# Patient Record
Sex: Male | Born: 1935
Health system: Southern US, Community
[De-identification: ages and names within clinical notes are randomized; demographics above are authoritative.]

## PROBLEM LIST (undated history)

## (undated) DIAGNOSIS — D649 Anemia, unspecified: Secondary | ICD-10-CM

## (undated) DIAGNOSIS — L0390 Cellulitis, unspecified: Secondary | ICD-10-CM

## (undated) DIAGNOSIS — C801 Malignant (primary) neoplasm, unspecified: Secondary | ICD-10-CM

## (undated) DIAGNOSIS — J45909 Unspecified asthma, uncomplicated: Secondary | ICD-10-CM

## (undated) DIAGNOSIS — H5440 Blindness, one eye, unspecified eye: Secondary | ICD-10-CM

## (undated) DIAGNOSIS — K922 Gastrointestinal hemorrhage, unspecified: Secondary | ICD-10-CM

## (undated) DIAGNOSIS — K5900 Constipation, unspecified: Secondary | ICD-10-CM

## (undated) DIAGNOSIS — I4819 Other persistent atrial fibrillation: Secondary | ICD-10-CM

## (undated) DIAGNOSIS — G894 Chronic pain syndrome: Secondary | ICD-10-CM

## (undated) DIAGNOSIS — J939 Pneumothorax, unspecified: Secondary | ICD-10-CM

## (undated) DIAGNOSIS — J189 Pneumonia, unspecified organism: Secondary | ICD-10-CM

## (undated) DIAGNOSIS — Z973 Presence of spectacles and contact lenses: Secondary | ICD-10-CM

## (undated) DIAGNOSIS — I4891 Unspecified atrial fibrillation: Secondary | ICD-10-CM

## (undated) DIAGNOSIS — N189 Chronic kidney disease, unspecified: Secondary | ICD-10-CM

## (undated) DIAGNOSIS — M5136 Other intervertebral disc degeneration, lumbar region: Secondary | ICD-10-CM

## (undated) DIAGNOSIS — Z9289 Personal history of other medical treatment: Secondary | ICD-10-CM

## (undated) DIAGNOSIS — C49A2 Gastrointestinal stromal tumor of stomach: Secondary | ICD-10-CM

## (undated) DIAGNOSIS — R1900 Intra-abdominal and pelvic swelling, mass and lump, unspecified site: Secondary | ICD-10-CM

## (undated) DIAGNOSIS — M51369 Other intervertebral disc degeneration, lumbar region without mention of lumbar back pain or lower extremity pain: Secondary | ICD-10-CM

## (undated) DIAGNOSIS — M419 Scoliosis, unspecified: Secondary | ICD-10-CM

## (undated) DIAGNOSIS — G4700 Insomnia, unspecified: Secondary | ICD-10-CM

## (undated) DIAGNOSIS — G4730 Sleep apnea, unspecified: Secondary | ICD-10-CM

## (undated) DIAGNOSIS — L97519 Non-pressure chronic ulcer of other part of right foot with unspecified severity: Secondary | ICD-10-CM

## (undated) DIAGNOSIS — G2581 Restless legs syndrome: Secondary | ICD-10-CM

## (undated) HISTORY — PX: CHOLECYSTECTOMY: SHX55

## (undated) HISTORY — PX: TONSILLECTOMY: SUR1361

## (undated) HISTORY — DX: Pneumonia, unspecified organism: J18.9

## (undated) HISTORY — DX: Other intervertebral disc degeneration, lumbar region without mention of lumbar back pain or lower extremity pain: M51.369

## (undated) HISTORY — DX: Chronic pain syndrome: G89.4

## (undated) HISTORY — DX: Insomnia, unspecified: G47.00

## (undated) HISTORY — DX: Other intervertebral disc degeneration, lumbar region: M51.36

## (undated) HISTORY — PX: COLONOSCOPY: SHX174

## (undated) HISTORY — PX: PROSTATECTOMY: SHX69

## (undated) HISTORY — DX: Pneumothorax, unspecified: J93.9

---

## 1995-12-03 HISTORY — PX: TRANSURETHRAL RESECTION OF PROSTATE: SHX73

## 2004-12-02 HISTORY — PX: OTHER SURGICAL HISTORY: SHX169

## 2011-12-03 HISTORY — PX: OTHER SURGICAL HISTORY: SHX169

## 2012-01-03 DIAGNOSIS — L03119 Cellulitis of unspecified part of limb: Secondary | ICD-10-CM | POA: Diagnosis not present

## 2012-01-03 DIAGNOSIS — L02419 Cutaneous abscess of limb, unspecified: Secondary | ICD-10-CM | POA: Diagnosis not present

## 2012-01-22 DIAGNOSIS — H40249 Residual stage of angle-closure glaucoma, unspecified eye: Secondary | ICD-10-CM | POA: Diagnosis not present

## 2012-01-22 DIAGNOSIS — IMO0002 Reserved for concepts with insufficient information to code with codable children: Secondary | ICD-10-CM | POA: Diagnosis not present

## 2012-01-22 DIAGNOSIS — H2589 Other age-related cataract: Secondary | ICD-10-CM | POA: Diagnosis not present

## 2012-02-05 DIAGNOSIS — H2510 Age-related nuclear cataract, unspecified eye: Secondary | ICD-10-CM | POA: Diagnosis not present

## 2012-02-05 DIAGNOSIS — H2589 Other age-related cataract: Secondary | ICD-10-CM | POA: Diagnosis not present

## 2012-02-05 DIAGNOSIS — H348192 Central retinal vein occlusion, unspecified eye, stable: Secondary | ICD-10-CM | POA: Diagnosis not present

## 2012-02-10 DIAGNOSIS — Z8546 Personal history of malignant neoplasm of prostate: Secondary | ICD-10-CM | POA: Diagnosis not present

## 2012-02-10 DIAGNOSIS — H2589 Other age-related cataract: Secondary | ICD-10-CM | POA: Diagnosis not present

## 2012-02-10 DIAGNOSIS — H269 Unspecified cataract: Secondary | ICD-10-CM | POA: Diagnosis not present

## 2012-02-10 DIAGNOSIS — Z905 Acquired absence of kidney: Secondary | ICD-10-CM | POA: Diagnosis not present

## 2012-02-19 DIAGNOSIS — M25559 Pain in unspecified hip: Secondary | ICD-10-CM | POA: Diagnosis not present

## 2012-02-19 DIAGNOSIS — M47817 Spondylosis without myelopathy or radiculopathy, lumbosacral region: Secondary | ICD-10-CM | POA: Diagnosis not present

## 2012-02-25 DIAGNOSIS — D492 Neoplasm of unspecified behavior of bone, soft tissue, and skin: Secondary | ICD-10-CM | POA: Diagnosis not present

## 2012-02-25 DIAGNOSIS — I8310 Varicose veins of unspecified lower extremity with inflammation: Secondary | ICD-10-CM | POA: Diagnosis not present

## 2012-02-25 DIAGNOSIS — L219 Seborrheic dermatitis, unspecified: Secondary | ICD-10-CM | POA: Diagnosis not present

## 2012-03-04 DIAGNOSIS — G9332 Myalgic encephalomyelitis/chronic fatigue syndrome: Secondary | ICD-10-CM | POA: Diagnosis not present

## 2012-03-04 DIAGNOSIS — R5382 Chronic fatigue, unspecified: Secondary | ICD-10-CM | POA: Diagnosis not present

## 2012-03-04 DIAGNOSIS — G8929 Other chronic pain: Secondary | ICD-10-CM | POA: Diagnosis not present

## 2012-03-04 DIAGNOSIS — E870 Hyperosmolality and hypernatremia: Secondary | ICD-10-CM | POA: Diagnosis not present

## 2012-03-04 DIAGNOSIS — N320 Bladder-neck obstruction: Secondary | ICD-10-CM | POA: Diagnosis not present

## 2012-03-04 DIAGNOSIS — R748 Abnormal levels of other serum enzymes: Secondary | ICD-10-CM | POA: Diagnosis not present

## 2012-04-09 DIAGNOSIS — E119 Type 2 diabetes mellitus without complications: Secondary | ICD-10-CM | POA: Diagnosis not present

## 2012-04-09 DIAGNOSIS — E870 Hyperosmolality and hypernatremia: Secondary | ICD-10-CM | POA: Diagnosis not present

## 2012-04-09 DIAGNOSIS — R5382 Chronic fatigue, unspecified: Secondary | ICD-10-CM | POA: Diagnosis not present

## 2012-04-09 DIAGNOSIS — N390 Urinary tract infection, site not specified: Secondary | ICD-10-CM | POA: Diagnosis not present

## 2012-05-19 DIAGNOSIS — L738 Other specified follicular disorders: Secondary | ICD-10-CM | POA: Diagnosis not present

## 2012-05-19 DIAGNOSIS — D492 Neoplasm of unspecified behavior of bone, soft tissue, and skin: Secondary | ICD-10-CM | POA: Diagnosis not present

## 2012-05-19 DIAGNOSIS — I8310 Varicose veins of unspecified lower extremity with inflammation: Secondary | ICD-10-CM | POA: Diagnosis not present

## 2012-05-19 DIAGNOSIS — D235 Other benign neoplasm of skin of trunk: Secondary | ICD-10-CM | POA: Diagnosis not present

## 2012-05-29 DIAGNOSIS — N390 Urinary tract infection, site not specified: Secondary | ICD-10-CM | POA: Diagnosis not present

## 2012-06-25 DIAGNOSIS — R748 Abnormal levels of other serum enzymes: Secondary | ICD-10-CM | POA: Diagnosis not present

## 2012-06-25 DIAGNOSIS — N320 Bladder-neck obstruction: Secondary | ICD-10-CM | POA: Diagnosis not present

## 2012-06-25 DIAGNOSIS — Z0000 Encounter for general adult medical examination without abnormal findings: Secondary | ICD-10-CM | POA: Diagnosis not present

## 2012-06-25 DIAGNOSIS — E119 Type 2 diabetes mellitus without complications: Secondary | ICD-10-CM | POA: Diagnosis not present

## 2012-06-25 DIAGNOSIS — E870 Hyperosmolality and hypernatremia: Secondary | ICD-10-CM | POA: Diagnosis not present

## 2012-06-25 DIAGNOSIS — R5382 Chronic fatigue, unspecified: Secondary | ICD-10-CM | POA: Diagnosis not present

## 2012-06-25 DIAGNOSIS — E782 Mixed hyperlipidemia: Secondary | ICD-10-CM | POA: Diagnosis not present

## 2012-06-26 DIAGNOSIS — H5010 Unspecified exotropia: Secondary | ICD-10-CM | POA: Diagnosis not present

## 2012-06-26 DIAGNOSIS — H348192 Central retinal vein occlusion, unspecified eye, stable: Secondary | ICD-10-CM | POA: Diagnosis not present

## 2012-06-26 DIAGNOSIS — H4720 Unspecified optic atrophy: Secondary | ICD-10-CM | POA: Diagnosis not present

## 2012-07-10 DIAGNOSIS — M4120 Other idiopathic scoliosis, site unspecified: Secondary | ICD-10-CM | POA: Diagnosis not present

## 2012-07-10 DIAGNOSIS — M549 Dorsalgia, unspecified: Secondary | ICD-10-CM | POA: Diagnosis not present

## 2012-07-10 DIAGNOSIS — Z9889 Other specified postprocedural states: Secondary | ICD-10-CM | POA: Diagnosis not present

## 2012-07-10 DIAGNOSIS — N1330 Unspecified hydronephrosis: Secondary | ICD-10-CM | POA: Diagnosis not present

## 2012-07-10 DIAGNOSIS — C61 Malignant neoplasm of prostate: Secondary | ICD-10-CM | POA: Diagnosis not present

## 2012-07-10 DIAGNOSIS — IMO0001 Reserved for inherently not codable concepts without codable children: Secondary | ICD-10-CM | POA: Diagnosis not present

## 2012-07-10 DIAGNOSIS — R109 Unspecified abdominal pain: Secondary | ICD-10-CM | POA: Diagnosis not present

## 2012-07-10 DIAGNOSIS — IMO0002 Reserved for concepts with insufficient information to code with codable children: Secondary | ICD-10-CM | POA: Diagnosis not present

## 2012-07-10 DIAGNOSIS — R52 Pain, unspecified: Secondary | ICD-10-CM | POA: Diagnosis not present

## 2012-07-10 DIAGNOSIS — M545 Low back pain: Secondary | ICD-10-CM | POA: Diagnosis not present

## 2012-07-10 DIAGNOSIS — Z87442 Personal history of urinary calculi: Secondary | ICD-10-CM | POA: Diagnosis not present

## 2012-07-10 DIAGNOSIS — Z8546 Personal history of malignant neoplasm of prostate: Secondary | ICD-10-CM | POA: Diagnosis not present

## 2012-07-14 DIAGNOSIS — M76899 Other specified enthesopathies of unspecified lower limb, excluding foot: Secondary | ICD-10-CM | POA: Diagnosis not present

## 2012-07-22 DIAGNOSIS — M76899 Other specified enthesopathies of unspecified lower limb, excluding foot: Secondary | ICD-10-CM | POA: Diagnosis not present

## 2012-10-14 DIAGNOSIS — M549 Dorsalgia, unspecified: Secondary | ICD-10-CM | POA: Diagnosis not present

## 2012-10-14 DIAGNOSIS — Z1331 Encounter for screening for depression: Secondary | ICD-10-CM | POA: Diagnosis not present

## 2012-10-14 DIAGNOSIS — R630 Anorexia: Secondary | ICD-10-CM | POA: Diagnosis not present

## 2012-10-22 DIAGNOSIS — Z1331 Encounter for screening for depression: Secondary | ICD-10-CM | POA: Diagnosis not present

## 2012-10-22 DIAGNOSIS — Z9181 History of falling: Secondary | ICD-10-CM | POA: Diagnosis not present

## 2012-10-22 DIAGNOSIS — Q742 Other congenital malformations of lower limb(s), including pelvic girdle: Secondary | ICD-10-CM | POA: Diagnosis not present

## 2012-10-22 DIAGNOSIS — L509 Urticaria, unspecified: Secondary | ICD-10-CM | POA: Diagnosis not present

## 2012-10-31 DIAGNOSIS — L03119 Cellulitis of unspecified part of limb: Secondary | ICD-10-CM | POA: Diagnosis not present

## 2012-10-31 DIAGNOSIS — L02419 Cutaneous abscess of limb, unspecified: Secondary | ICD-10-CM | POA: Diagnosis not present

## 2012-11-04 DIAGNOSIS — L259 Unspecified contact dermatitis, unspecified cause: Secondary | ICD-10-CM | POA: Diagnosis not present

## 2012-11-18 DIAGNOSIS — M2040 Other hammer toe(s) (acquired), unspecified foot: Secondary | ICD-10-CM | POA: Diagnosis not present

## 2012-11-18 DIAGNOSIS — M2010 Hallux valgus (acquired), unspecified foot: Secondary | ICD-10-CM | POA: Diagnosis not present

## 2012-11-30 DIAGNOSIS — Z125 Encounter for screening for malignant neoplasm of prostate: Secondary | ICD-10-CM | POA: Diagnosis not present

## 2012-11-30 DIAGNOSIS — Z0000 Encounter for general adult medical examination without abnormal findings: Secondary | ICD-10-CM | POA: Diagnosis not present

## 2012-11-30 DIAGNOSIS — R630 Anorexia: Secondary | ICD-10-CM | POA: Diagnosis not present

## 2012-12-02 DIAGNOSIS — L253 Unspecified contact dermatitis due to other chemical products: Secondary | ICD-10-CM | POA: Diagnosis not present

## 2012-12-02 DIAGNOSIS — L02419 Cutaneous abscess of limb, unspecified: Secondary | ICD-10-CM | POA: Diagnosis not present

## 2012-12-02 DIAGNOSIS — L03119 Cellulitis of unspecified part of limb: Secondary | ICD-10-CM | POA: Diagnosis not present

## 2012-12-04 ENCOUNTER — Inpatient Hospital Stay (HOSPITAL_COMMUNITY)
Admission: AD | Admit: 2012-12-04 | Discharge: 2012-12-08 | DRG: 603 | Disposition: A | Discharge status: Home Health | Payer: Medicare Other | Source: Ambulatory Visit | Attending: Internal Medicine | Admitting: Internal Medicine

## 2012-12-04 ENCOUNTER — Other Ambulatory Visit: Payer: Self-pay | Admitting: Internal Medicine

## 2012-12-04 ENCOUNTER — Inpatient Hospital Stay (HOSPITAL_COMMUNITY): Payer: Medicare Other

## 2012-12-04 ENCOUNTER — Encounter (HOSPITAL_COMMUNITY): Payer: Self-pay

## 2012-12-04 DIAGNOSIS — G8929 Other chronic pain: Secondary | ICD-10-CM

## 2012-12-04 DIAGNOSIS — Z905 Acquired absence of kidney: Secondary | ICD-10-CM

## 2012-12-04 DIAGNOSIS — L0390 Cellulitis, unspecified: Secondary | ICD-10-CM | POA: Diagnosis not present

## 2012-12-04 DIAGNOSIS — M4000 Postural kyphosis, site unspecified: Secondary | ICD-10-CM | POA: Diagnosis not present

## 2012-12-04 DIAGNOSIS — L0291 Cutaneous abscess, unspecified: Secondary | ICD-10-CM | POA: Diagnosis not present

## 2012-12-04 DIAGNOSIS — G2581 Restless legs syndrome: Secondary | ICD-10-CM

## 2012-12-04 DIAGNOSIS — H5440 Blindness, one eye, unspecified eye: Secondary | ICD-10-CM | POA: Diagnosis present

## 2012-12-04 DIAGNOSIS — L259 Unspecified contact dermatitis, unspecified cause: Secondary | ICD-10-CM | POA: Diagnosis present

## 2012-12-04 DIAGNOSIS — L03119 Cellulitis of unspecified part of limb: Principal | ICD-10-CM | POA: Diagnosis present

## 2012-12-04 DIAGNOSIS — L309 Dermatitis, unspecified: Secondary | ICD-10-CM

## 2012-12-04 DIAGNOSIS — L02419 Cutaneous abscess of limb, unspecified: Principal | ICD-10-CM | POA: Diagnosis present

## 2012-12-04 DIAGNOSIS — Z9079 Acquired absence of other genital organ(s): Secondary | ICD-10-CM | POA: Insufficient documentation

## 2012-12-04 DIAGNOSIS — Z8546 Personal history of malignant neoplasm of prostate: Secondary | ICD-10-CM | POA: Diagnosis not present

## 2012-12-04 DIAGNOSIS — M25869 Other specified joint disorders, unspecified knee: Secondary | ICD-10-CM | POA: Diagnosis not present

## 2012-12-04 DIAGNOSIS — L0100 Impetigo, unspecified: Secondary | ICD-10-CM | POA: Diagnosis not present

## 2012-12-04 DIAGNOSIS — Z79899 Other long term (current) drug therapy: Secondary | ICD-10-CM | POA: Diagnosis not present

## 2012-12-04 DIAGNOSIS — M40209 Unspecified kyphosis, site unspecified: Secondary | ICD-10-CM | POA: Insufficient documentation

## 2012-12-04 HISTORY — DX: Restless legs syndrome: G25.81

## 2012-12-04 HISTORY — DX: Chronic kidney disease, unspecified: N18.9

## 2012-12-04 HISTORY — DX: Malignant (primary) neoplasm, unspecified: C80.1

## 2012-12-04 HISTORY — DX: Unspecified asthma, uncomplicated: J45.909

## 2012-12-04 HISTORY — DX: Blindness, one eye, unspecified eye: H54.40

## 2012-12-04 LAB — PROTIME-INR: INR: 1.05 (ref 0.00–1.49)

## 2012-12-04 LAB — COMPREHENSIVE METABOLIC PANEL
ALT: 19 U/L (ref 0–53)
AST: 22 U/L (ref 0–37)
Albumin: 3.9 g/dL (ref 3.5–5.2)
Alkaline Phosphatase: 75 U/L (ref 39–117)
Chloride: 97 mEq/L (ref 96–112)
Creatinine, Ser: 1.15 mg/dL (ref 0.50–1.35)
Potassium: 4.5 mEq/L (ref 3.5–5.1)
Sodium: 132 mEq/L — ABNORMAL LOW (ref 135–145)
Total Bilirubin: 0.3 mg/dL (ref 0.3–1.2)

## 2012-12-04 LAB — CBC
Platelets: 199 10*3/uL (ref 150–400)
RDW: 12.6 % (ref 11.5–15.5)
WBC: 7 10*3/uL (ref 4.0–10.5)

## 2012-12-04 MED ORDER — MORPHINE SULFATE 4 MG/ML IJ SOLN: 4.0000 mg | INTRAMUSCULAR | Status: DC | PRN

## 2012-12-04 MED ORDER — SODIUM CHLORIDE 0.9 % IV SOLN: 250.0000 mL | INTRAVENOUS | Status: DC | PRN

## 2012-12-04 MED ORDER — ONDANSETRON HCL 4 MG PO TABS: 4.0000 mg | ORAL_TABLET | Freq: Four times a day (QID) | ORAL | Status: DC | PRN

## 2012-12-04 MED ORDER — OXYCODONE HCL ER 40 MG PO T12A: 60.0000 mg | EXTENDED_RELEASE_TABLET | Freq: Two times a day (BID) | ORAL | Status: DC

## 2012-12-04 MED ORDER — TRAZODONE HCL 50 MG PO TABS
50.0000 mg | ORAL_TABLET | Freq: Every evening | ORAL | Status: DC | PRN
  Filled 2012-12-04: qty 1

## 2012-12-04 MED ORDER — ONDANSETRON HCL 4 MG/2ML IJ SOLN: 4.0000 mg | Freq: Four times a day (QID) | INTRAMUSCULAR | Status: DC | PRN

## 2012-12-04 MED ORDER — ROPINIROLE HCL 1 MG PO TABS
1.0000 mg | ORAL_TABLET | Freq: Every day | ORAL | Status: DC
  Administered 2012-12-04 – 2012-12-08 (×17): 1 mg via ORAL
  Filled 2012-12-04 (×22): qty 1

## 2012-12-04 MED ORDER — VANCOMYCIN HCL IN DEXTROSE 1-5 GM/200ML-% IV SOLN
1000.0000 mg | Freq: Once | INTRAVENOUS | Status: AC
  Administered 2012-12-04: 1000 mg via INTRAVENOUS
  Filled 2012-12-04: qty 200

## 2012-12-04 MED ORDER — OXYCODONE HCL ER 40 MG PO T12A
40.0000 mg | EXTENDED_RELEASE_TABLET | Freq: Three times a day (TID) | ORAL | Status: DC
  Administered 2012-12-04 – 2012-12-08 (×12): 40 mg via ORAL
  Filled 2012-12-04 (×14): qty 1

## 2012-12-04 MED ORDER — OXYCODONE-ACETAMINOPHEN 5-325 MG PO TABS
2.0000 | ORAL_TABLET | Freq: Four times a day (QID) | ORAL | Status: DC | PRN
  Administered 2012-12-07: 2 via ORAL
  Filled 2012-12-04: qty 2

## 2012-12-04 MED ORDER — ROPINIROLE HCL 1 MG PO TABS
1.0000 mg | ORAL_TABLET | Freq: Three times a day (TID) | ORAL | Status: DC
  Administered 2012-12-04: 1 mg via ORAL
  Filled 2012-12-04 (×2): qty 1

## 2012-12-04 MED ORDER — SODIUM CHLORIDE 0.9 % IJ SOLN
3.0000 mL | Freq: Two times a day (BID) | INTRAMUSCULAR | Status: DC
  Administered 2012-12-04 – 2012-12-07 (×8): 3 mL via INTRAVENOUS

## 2012-12-04 MED ORDER — BISACODYL 5 MG PO TBEC: 5.0000 mg | DELAYED_RELEASE_TABLET | Freq: Every day | ORAL | Status: DC | PRN

## 2012-12-04 MED ORDER — POLYETHYLENE GLYCOL 3350 17 G PO PACK
17.0000 g | PACK | Freq: Every day | ORAL | Status: DC
  Administered 2012-12-04 – 2012-12-05 (×2): 17 g via ORAL
  Filled 2012-12-04 (×2): qty 1

## 2012-12-04 MED ORDER — SODIUM CHLORIDE 0.9 % IJ SOLN: 3.0000 mL | INTRAMUSCULAR | Status: DC | PRN

## 2012-12-04 MED ORDER — VANCOMYCIN HCL 500 MG IV SOLR
500.0000 mg | Freq: Two times a day (BID) | INTRAVENOUS | Status: DC
  Administered 2012-12-05 – 2012-12-07 (×6): 500 mg via INTRAVENOUS
  Filled 2012-12-04 (×7): qty 500

## 2012-12-04 MED ORDER — ALUM & MAG HYDROXIDE-SIMETH 200-200-20 MG/5ML PO SUSP
30.0000 mL | Freq: Four times a day (QID) | ORAL | Status: DC | PRN
  Administered 2012-12-04: 30 mL via ORAL
  Filled 2012-12-04: qty 30

## 2012-12-04 MED ORDER — ENOXAPARIN SODIUM 40 MG/0.4ML ~~LOC~~ SOLN
40.0000 mg | SUBCUTANEOUS | Status: DC
  Administered 2012-12-04 – 2012-12-07 (×4): 40 mg via SUBCUTANEOUS
  Filled 2012-12-04 (×5): qty 0.4

## 2012-12-04 NOTE — H&P (Signed)
Physician Admission History and Physical     PCP:  Casey Gates   Chief Complaint:  Cellulitis that has failed keflex and bactrim outpatient   HPI: Casey Gates is an 79 y.o. male.  Elderly male that suffers from chronic pain ever since undergoing and R sided nephrectomy for an extensively fibrosed kidney. It addition he suffers from kyphosis that is another source of his pain. He does suffer from constipation 2/2 his chronic narcotic use as well.   Per his report, his LLE cellulitis originally started on Nov 30th. He presented to urgent care and was placed on keflex. That resulted in some resolution at first, but then the infection returned, so he returned to the urgent care and was placed on bactrim 3 days ago. Since being placed on bactrim, the erythema has spread. He denies any impairment of ROM of his knee. No fevers/chills. No tenderness of the leg. No loss of color. No numbness. No warmth. He has no h/o arthroplasty or hardware placement in any joints.   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 widowed. law degree. retired Pensions consultant  tob: none  etoh: none . quit at 57     Medications: Prior to Admission medications   Medication Sig Start Date End Date Taking? Authorizing Provider  docusate sodium (COLACE) 50 MG capsule Take by mouth 2 (two) times daily as needed.   Yes Historical Provider, MD  oxyCODONE  (OXYCONTIN) 20 MG 12 hr tablet Take 20 mg by mouth every 12 (twelve) hours.   Yes Historical Provider, MD  oxyCODONE-acetaminophen (PERCOCET) 10-325 MG per tablet Take 1 tablet by mouth every 6 (six) hours as needed.   Yes Historical Provider, MD  rOPINIRole (REQUIP) 1 MG tablet Take 1 mg by mouth 3 (three) times daily.   Yes Historical Provider, MD  traZODone (DESYREL) 50 MG tablet Take 50 mg by mouth at bedtime.   Yes Historical Provider, MD    Allergies:  Allergies not on file   Physical Exam: Filed Vitals:   12/04/12 1503  BP: 112/76  Pulse: 70  Temp: 97.3 F (36.3 C)  TempSrc: Oral  Resp: 16  Height: 6\' 2"  (1.88 m)  Weight: 149 lb 11.1 oz (67.9 kg)  SpO2: 96%   General appearance: elderly male in NAD  HEENT: MMM, trachea midline, no JVD, no LAD Resp: CTAB, no wheezes or rales  Cardio: RRR, no MRG  GI: soft, non-tender; bowel sounds normal; no masses,  no organomegaly Extremities: 4cm x 6cm area of erythema on the medial surface of the L knee. No limited ROM of the L knee. No fluctuance to the lesion. No discharge. No warmth Pulses: 2+ and symmetric Lymph nodes: Cervical adenopathy: no cervical lymphadenopathy Neurologic: Alert and oriented X 3, normal strength and tone. Normal symmetric reflexes.     Assessment/Plan Principal Problem:  *Cellulitis and abscess of leg, except foot  - mark area  of cellulitis , so it can be monitored   - start vanc for MRSA therapy, given failed bactrim as outpatient   - get XRAY of knee to r/o osteomyelitis   - check ESR/CRP   Active Problems:  Chronic pain  - cont home OxyContin and percocet   - add morphine IV prn for severe pain   Narcotic induced constipation   - colace and mirlax scheduled   - dulcolax prn severe constipation  PPx   - lovenox    FEN   - normal diet, KVO  Dispo   - likely needs 3-5 days of IV antibiotics    Casey Gates 12/04/2012, 3:30 PM ]

## 2012-12-04 NOTE — Progress Notes (Signed)
ANTIBIOTIC CONSULT NOTE - INITIAL  Pharmacy Consult for Vancomycin Indication: LLE cellulitis  Allergies no known allergies  Patient Measurements: Height: 6\' 2"  (188 cm) Weight: 149 lb 11.1 oz (67.9 kg) IBW/kg (Calculated) : 82.2   Vital Signs: Temp: 97.3 F (36.3 C) (01/03 1503) Temp src: Oral (01/03 1503) BP: 112/76 mmHg (01/03 1503) Pulse Rate: 70  (01/03 1503) Intake/Output from previous day:   Intake/Output from this shift:    Labs: SCr: 1.15 CrCl: 45 ml/min WBC: 7.0   Microbiology: No results found for this or any previous visit (from the past 720 hour(s)).  Medical History: Prostate CA s/p TURP Chronic back pain s/p nephrectomy L eye blindness  Medications:  Scheduled:    . enoxaparin (LOVENOX) injection  40 mg Subcutaneous Q24H  . OxyCODONE  40 mg Oral Q8H  . polyethylene glycol  17 g Oral Daily  . rOPINIRole  1 mg Oral TID  . sodium chloride  3 mL Intravenous Q12H  . vancomycin  1,000 mg Intravenous Once  . [DISCONTINUED] OxyCODONE  60 mg Oral Q12H   Assessment: 86 YOM with worsening LLE cellulitis despite outpatient tx with keflex, then bactrim.   Goal of Therapy:  Vancomycin trough level 15-20 mcg/ml   Plan:   Vancomycin 1gm IV x 1, then 500mg  q12h Check trough at steady state Follow up renal function & cultures  Loralee Pacas, PharmD, BCPS Pager: [phone] 12/04/2012,3:44 PM

## 2012-12-05 DIAGNOSIS — L0291 Cutaneous abscess, unspecified: Secondary | ICD-10-CM | POA: Diagnosis not present

## 2012-12-05 DIAGNOSIS — G8929 Other chronic pain: Secondary | ICD-10-CM | POA: Diagnosis not present

## 2012-12-05 DIAGNOSIS — M4000 Postural kyphosis, site unspecified: Secondary | ICD-10-CM | POA: Diagnosis not present

## 2012-12-05 LAB — BASIC METABOLIC PANEL
GFR calc Af Amer: 72 mL/min — ABNORMAL LOW (ref >90)
GFR calc non Af Amer: 62 mL/min — ABNORMAL LOW (ref >90)
Glucose, Bld: 101 mg/dL — ABNORMAL HIGH (ref 70–99)
Potassium: 4.6 mEq/L (ref 3.5–5.1)
Sodium: 134 mEq/L — ABNORMAL LOW (ref 135–145)

## 2012-12-05 LAB — CBC
Hemoglobin: 12.7 g/dL — ABNORMAL LOW (ref 13.0–17.0)
MCH: 29.6 pg (ref 26.0–34.0)
RBC: 4.29 MIL/uL (ref 4.22–5.81)

## 2012-12-05 MED ORDER — POLYETHYLENE GLYCOL 3350 17 G PO PACK
17.0000 g | PACK | Freq: Two times a day (BID) | ORAL | Status: DC
  Administered 2012-12-06 – 2012-12-08 (×5): 17 g via ORAL
  Filled 2012-12-05 (×7): qty 1

## 2012-12-05 NOTE — Progress Notes (Signed)
Clinical Social Work Department BRIEF PSYCHOSOCIAL ASSESSMENT 12/05/2012  Patient:  Casey Gates, Casey Gates     Account Number:  [id number]     Admit date:  12/04/2012  Clinical Social Worker:  Leron Croak, CLINICAL SOCIAL WORKER  Date/Time:  12/05/2012 04:37 PM  Referred by:  Physician  Date Referred:  12/04/2012 Referred for  Other - See comment   Other Referral:   Pt from [facility]   Interview type:  Patient Other interview type:    PSYCHOSOCIAL DATA Living Status:  FACILITY Admitted from facility:  [facility] Level of care:   Primary support name:  Casey Gates Primary support relationship to patient:  CHILD, ADULT Degree of support available:   Pt has good support from son and independent living community.    CURRENT CONCERNS Current Concerns  Post-Acute Placement   Other Concerns:    SOCIAL WORK ASSESSMENT / PLAN CSW met with the pt in the room. Pt was being assited by a nurse at the time of assessment. Pt was able to express that he is from [facility] facility and does intend to go back at the time of d/c. Pt was agreeable to assitance from CSW.   Assessment/plan status:  Information/Referral to [facility] Other assessment/ plan:   Information/referral to community resources:   No informational hand outs needed at this time.    PATIENT'S/FAMILY'S RESPONSE TO PLAN OF CARE: Pt was appreciative for assistance with d/c planning and verified contact information for son.

## 2012-12-05 NOTE — Progress Notes (Signed)
Subjective: Had a good night. No chills or sweats. No fever of note Some constipation without discomfort.   Objective: Vital signs in last 24 hours: Temp:  [97.3 F (36.3 C)-98.6 F (37 C)] 98.6 F (37 C) (01/04 0545) Pulse Rate:  [65-71] 65  (01/04 0545) Resp:  [16-20] 20  (01/04 0545) BP: (107-126)/(56-76) 107/56 mmHg (01/04 0545) SpO2:  [95 %-97 %] 95 % (01/04 0545) Weight:  [67.9 kg (149 lb 11.1 oz)] 67.9 kg (149 lb 11.1 oz) (01/03 1503)  Intake/Output from previous day: 01/03 0701 - 01/04 0700 In: 243 [P.O.:240; I.V.:3] Out: -  Intake/Output this shift:    General: alert, sitting up in no distress.face symmetric. Neck supple. Lungs diminshed but clear. Ht regular distant. abd soft NT. Extrems: redness has receded about 1 cm from lines drawn yesterday, area behind knee is dry. Awake, alert  Lab Results   [facility] 12/05/12 0512 12/04/12 1639  WBC 7.1 7.0  RBC 4.29 4.57  HGB 12.7* 13.6  HCT 37.7* 40.0  MCV 87.9 87.5  MCH 29.6 29.8  RDW 12.7 12.6  PLT 185 199    Basename 12/05/12 0512 12/04/12 1639  NA 134* 132*  K 4.6 4.5  CL 101 97  CO2 27 26  GLUCOSE 101* 112*  BUN 16 16  CREATININE 1.06 1.15  CALCIUM 9.1 9.7  Results for [facility] (MRN [id number]) as of 12/05/2012 10:03  Ref. Range 12/04/2012 16:39  CRP Latest Range: <0.60 mg/dL <4.0 (L)    Studies/Results: Dg Knee Complete 4 Views Left  12/04/2012  *RADIOLOGY REPORT*  Clinical Data: History of posterior knee pain and redness for several days.  LEFT KNEE - COMPLETE 4+ VIEW  Comparison: None.  Findings: There is no evidence of joint effusion.  Nonaneurysmal arterial calcifications are present.  There is narrowing of the medial joint space.  No fracture or bony destruction is seen.  No erosive change or chondrocalcinosis is evident.  IMPRESSION: Nonaneurysmal arterial calcifications.  Narrowing of medial joint space.   Original Report Authenticated By: Onalee Hua Call     Scheduled Meds:   . enoxaparin (LOVENOX)  injection  40 mg Subcutaneous Q24H  . OxyCODONE  40 mg Oral Q8H  . polyethylene glycol  17 g Oral BID  . rOPINIRole  1 mg Oral 5 X Daily  . sodium chloride  3 mL Intravenous Q12H  . vancomycin  500 mg Intravenous Q12H   Continuous Infusions:  PRN Meds:sodium chloride, alum & mag hydroxide-simeth, bisacodyl, morphine injection, ondansetron (ZOFRAN) IV, ondansetron, oxyCODONE-acetaminophen, sodium chloride, traZODone  Assessment/Plan:  *Cellulitis and abscess of leg, except foot : seems a bit better on exam -  on vanc for MRSA therapy, given failed bactrim as outpatient  -  XRAY of knee to r/o osteomyelitis was fine -  /CRP low, ESR not available Active Problems:  Chronic pain  - cont home OxyContin and percocet  - add morphine IV prn for severe pain  Narcotic induced constipation  - colace and mirlax scheduled , increase miralax - dulcolax prn severe constipation  PPx  - lovenox  FEN  - normal diet, KVO    LOS: 1 day   Adreena Willits ALAN 12/05/2012, 10:00 AM

## 2012-12-06 LAB — BASIC METABOLIC PANEL
CO2: 27 mEq/L (ref 19–32)
Glucose, Bld: 95 mg/dL (ref 70–99)
Potassium: 4.7 mEq/L (ref 3.5–5.1)
Sodium: 136 mEq/L (ref 135–145)

## 2012-12-06 LAB — CBC
Hemoglobin: 12.9 g/dL — ABNORMAL LOW (ref 13.0–17.0)
MCH: 29.1 pg (ref 26.0–34.0)
MCV: 88.5 fL (ref 78.0–100.0)
RBC: 4.44 MIL/uL (ref 4.22–5.81)

## 2012-12-06 NOTE — Progress Notes (Signed)
Subjective: Had a pretty good night. Some leg pain. No chills or sweats. Breathing OK. Bowels OK   Objective: Vital signs in last 24 hours: Temp:  [98.3 F (36.8 C)-98.8 F (37.1 C)] 98.5 F (36.9 C) (01/05 0658) Pulse Rate:  [65-68] 65  (01/05 0658) Resp:  [18-20] 20  (01/05 0658) BP: (96-117)/(59-65) 112/59 mmHg (01/05 0658) SpO2:  [97 %-98 %] 98 % (01/05 0658)  Intake/Output from previous day: 01/04 0701 - 01/05 0700 In: 940 [P.O.:840; IV Piggyback:100] Out: -  Intake/Output this shift:    General: alert  , sitting up in no distress.face symmetric. Neck supple. Lungs diminshed but clear. Ht regular distant. abd soft NT. Extrems: redness has not changed much, some scaling near the popliteal area. Awake and alert  Lab Results   Basename 12/06/12 0406 12/05/12 0512  WBC 6.3 7.1  RBC 4.44 4.29  HGB 12.9* 12.7*  HCT 39.3 37.7*  MCV 88.5 87.9  MCH 29.1 29.6  RDW 12.5 12.7  PLT 175 185    Basename 12/06/12 0406 12/05/12 0512  NA 136 134*  K 4.7 4.6  CL 102 101  CO2 27 27  GLUCOSE 95 101*  BUN 16 16  CREATININE 1.02 1.06  CALCIUM 9.2 9.1  Results for [facility] (MRN [id number]) as of 12/06/2012 11:11  Ref. Range 12/04/2012 16:39  Sed Rate Latest Range: 0-16 mm/hr 1    Studies/Results: Dg Knee Complete 4 Views Left  12/04/2012  *RADIOLOGY REPORT*  Clinical Data: History of posterior knee pain and redness for several days.  LEFT KNEE - COMPLETE 4+ VIEW  Comparison: None.  Findings: There is no evidence of joint effusion.  Nonaneurysmal arterial calcifications are present.  There is narrowing of the medial joint space.  No fracture or bony destruction is seen.  No erosive change or chondrocalcinosis is evident.  IMPRESSION: Nonaneurysmal arterial calcifications.  Narrowing of medial joint space.   Original Report Authenticated By: Onalee Hua Call     Scheduled Meds:   . enoxaparin (LOVENOX) injection  40 mg Subcutaneous Q24H  . OxyCODONE  40 mg Oral Q8H  . polyethylene  glycol  17 g Oral BID  . rOPINIRole  1 mg Oral 5 X Daily  . sodium chloride  3 mL Intravenous Q12H  . vancomycin  500 mg Intravenous Q12H   Continuous Infusions:  PRN Meds:sodium chloride, alum & mag hydroxide-simeth, bisacodyl, morphine injection, ondansetron (ZOFRAN) IV, ondansetron, oxyCODONE-acetaminophen, sodium chloride, traZODone  Assessment/Plan:  *Cellulitis and abscess of leg, except foot : seems about the same, WBC 7.1 - on vanc for MRSA therapy, given failed bactrim as outpatient  - XRAY of knee to r/o osteomyelitis was fine  - /CRP low, ESR was fine  Results for Casey Gates, Casey Gates (MRN [id number]) as of 12/06/2012 11:11  Ref. Range 12/04/2012 16:39  Sed Rate Latest Range: 0-16 mm/hr 1   Active Problems:  Chronic pain Doing well - cont home OxyContin and percocet  - add morphine IV prn for severe pain  Narcotic induced constipation  - colace and mirlax scheduled , increase miralax  - dulcolax prn severe constipation  PPx  - lovenox  FEN  - normal diet, KVO    LOS: 2 days   Casey Gates Casey Gates 12/06/2012, 11:06 AM

## 2012-12-07 DIAGNOSIS — L259 Unspecified contact dermatitis, unspecified cause: Secondary | ICD-10-CM | POA: Diagnosis not present

## 2012-12-07 DIAGNOSIS — G8929 Other chronic pain: Secondary | ICD-10-CM | POA: Diagnosis not present

## 2012-12-07 DIAGNOSIS — M4000 Postural kyphosis, site unspecified: Secondary | ICD-10-CM | POA: Diagnosis not present

## 2012-12-07 DIAGNOSIS — L0291 Cutaneous abscess, unspecified: Secondary | ICD-10-CM | POA: Diagnosis not present

## 2012-12-07 DIAGNOSIS — L0390 Cellulitis, unspecified: Secondary | ICD-10-CM | POA: Diagnosis not present

## 2012-12-07 LAB — CBC
HCT: 39.8 % (ref 39.0–52.0)
MCHC: 33.9 g/dL (ref 30.0–36.0)
MCV: 88.1 fL (ref 78.0–100.0)
RDW: 12.4 % (ref 11.5–15.5)

## 2012-12-07 LAB — BASIC METABOLIC PANEL
BUN: 17 mg/dL (ref 6–23)
CO2: 27 mEq/L (ref 19–32)
Chloride: 100 mEq/L (ref 96–112)
Creatinine, Ser: 0.9 mg/dL (ref 0.50–1.35)

## 2012-12-07 MED ORDER — CLOBETASOL PROPIONATE 0.05 % EX CREA
TOPICAL_CREAM | Freq: Three times a day (TID) | CUTANEOUS | Status: DC
  Administered 2012-12-07: 23:00:00 via TOPICAL
  Filled 2012-12-07: qty 15

## 2012-12-07 NOTE — Consult Note (Addendum)
  Dermatology:  Casey Casey Gates is known to me from previous visits in my office.  He is an 79 year old male with several chronic medical conditions, but of particular relevance is his skin condition.  He has been follow for years by Casey Gates at [facility] for an nonspecific  chronic skin dermatitis.  This is bothersome due to chronically intermittent itching.  Apparently, he had some trouble with an acute skin eruption over his left medial knee back in November.  He saw someone at urgent care or his PCP and made an appointment with me.  When I saw him a week later, he was completing a week of cephalexin and was much better, so we turned our attention to his chronic dermatitis.    The knee condition flared again in the past week or two.  He did not respond to Bactrim, and was admitted for possible cellulitis on 12/05/11.  He has been receiving IV Vanco for the past 4 days.  Interestingly, he reports and the chart confirms that he has mostly felt well, no fevers, no malaise.  He states the skin at his knee hurts when he tries to walk, but he thinks this is due stretching of the irritated skin, and not due to underlying problems.  He reports that the redness of his leg has improved since hospitalization.  XR did not reveal underlying problems, and PCR for MRSA was negative.  PE:  Casey. Gates is talkative and in no apparent distress.  Overall, his skin is dry, with some patchy diffuse erythema.  The left medial knee is noted to have erythema with a central plaque of light yellow to golden crust of about 5cm irregular diameter.  There are ink marks on the lower leg that presumably outlined prior erythema or edema, but the distal areas are pretty clear on my exam.    Impression/Plan:  Resolving acute eczematous dermatitis:  While I cannot rule out an acute cellulitis, I think my clinical findings and a review of the history support an acute dermatitis, leading to crusting with serous  exudate.  Bed rest has been beneficial, leading to decreased erythema and edema.  I would recommend clobetasol 0.05% cream tid until clear and follow up in my office in a week or two.  It is certainly possible cellulitis could precipitate acute dermatitis, and vice versa.    From my perspective, he can be discharged when you are ready.  Completion of the Vancomycin, or possibly completing a course of oral antibiotics after discharge is at your discretion.    Thank you for allowing me to participate in the care of Casey Gates.

## 2012-12-07 NOTE — Evaluation (Signed)
Physical Therapy Evaluation Patient Details Name: Casey Gates MRN: [id number] DOB: [birth year] Today's Date: 12/07/2012 Time: 4098-1191 PT Time Calculation (min): 18 min  PT Assessment / Plan / Recommendation Clinical Impression  Pt admitted with  LLE cellulitis ,pain. Pt.  relates pain is in R hip/groin area. Pt. lives in independent living and will benefit from PT to return to independent level;     PT Assessment  Patient needs continued PT services    Follow Up Recommendations  Home health PT    Does the patient have the potential to tolerate intense rehabilitation      Barriers to Discharge        Equipment Recommendations  None recommended by PT    Recommendations for Other Services OT consult   Frequency Min 3X/week    Precautions / Restrictions Precautions Precautions: Fall Restrictions Weight Bearing Restrictions: No   Pertinent Vitals/Pain States that L leg hurts when he walks      Mobility  Transfers Transfers: Sit to Stand;Stand to Sit Sit to Stand: 6: Modified independent (Device/Increase time);From toilet;With upper extremity assist Stand to Sit: To chair/3-in-1;With upper extremity assist;6: Modified independent (Device/Increase time) Ambulation/Gait Ambulation/Gait Assistance: 4: Min guard Ambulation Distance (Feet): 25 Feet Assistive device:  (IV pole) Ambulation/Gait Assistance Details: Pt. found up in room trying to get call bell untangled and IV pole out from Bed. Pt used IV pole to ambulate into BR and out of. gait is fairly steady. Pt. usually uses a 4 wheeled RW. Gait Pattern: Step-to pattern;Trunk rotated posteriorly on left;Trunk flexed Gait velocity: decreased    Shoulder Instructions     Exercises     PT Diagnosis: Difficulty walking;Generalized weakness;Acute pain  PT Problem List: Decreased strength;Decreased activity tolerance;Decreased balance;Decreased mobility;Decreased knowledge of precautions;Decreased safety awareness;Pain PT  Treatment Interventions: DME instruction;Gait training;Functional mobility training;Therapeutic activities;Therapeutic exercise;Patient/family education   PT Goals Acute Rehab PT Goals PT Goal Formulation: With patient Time For Goal Achievement: 12/21/12 Potential to Achieve Goals: Good Pt will Ambulate: >150 feet;with modified independence PT Goal: Ambulate - Progress: Goal set today  Visit Information  Last PT Received On: 12/07/12 Assistance Needed: +1    Subjective Data  Subjective: I just need to get to the bathroom.   Prior Functioning  Home Living Lives With: Alone Type of Home: Independent living facility Home Layout: One level Bathroom Shower/Tub: Health visitor: Handicapped height Bathroom Accessibility: Yes Home Adaptive Equipment: Environmental consultant - four wheeled Prior Function Level of Independence: Independent with assistive device(s) (except he takes his meals in dining room.) Vocation: Retired Musician: No difficulties    Cognition  Overall Cognitive Status: Appears within functional limits for tasks assessed/performed Arousal/Alertness: Awake/alert Orientation Level: Appears intact for tasks assessed Behavior During Session: [facility] for tasks performed    Extremity/Trunk Assessment Right Lower Extremity Assessment RLE ROM/Strength/Tone: [facility] for tasks assessed Left Lower Extremity Assessment LLE ROM/Strength/Tone: [facility] for tasks assessed Trunk Assessment Trunk Assessment: Kyphotic (scoliosis, severe.)   Balance Static Standing Balance Static Standing - Balance Support: Right upper extremity supported Static Standing - Level of Assistance: 5: Stand by assistance  End of Session PT - End of Session Activity Tolerance: Patient limited by pain;Patient limited by fatigue Patient left: in chair;with call bell/phone within reach Nurse Communication: Mobility status  GP     Rada Hay 12/07/2012, 11:33 AM  [phone]

## 2012-12-07 NOTE — Progress Notes (Signed)
Subjective: Noting that the lesion causes pain when he tries to walk, but this is very localized to the skin on medial surface and not the knee. Otherwise his BMs have returned to normal and eating full meals. He wants to have this pain improve b/f he leaves b/c he has to walk long distances daily for meals.   Objective: Vital signs in last 24 hours: Temp:  [98.1 F (36.7 C)-98.4 F (36.9 C)] 98.1 F (36.7 C) (01/06 0539) Pulse Rate:  [61-69] 64  (01/06 0539) Resp:  [16-20] 16  (01/06 0539) BP: (94-115)/(49-64) 94/49 mmHg (01/06 0539) SpO2:  [95 %-97 %] 95 % (01/06 0539)  Intake/Output from previous day: 01/05 0701 - 01/06 0700 In: 200 [IV Piggyback:200] Out: -  Intake/Output this shift:    Gen: NAD, eating full meals  HEENT: MMM, no icterus, trachea midline  Lungs: CTAB, no wheezes/rales  Cardio: RRR, no MRG  Abd: Nontender, soft, normal BS  Extremities: no pitting edema, normal ROM  Skin: On medial L knee, 3x6cm area of crusting/hyperkeratotic lesion w/ a surrounding erythema of about 2 cms. Minimally TTP at the sight of the lesion. No active discharge.   Lab Results   Basename 12/07/12 0355 12/06/12 0406  WBC 7.0 6.3  RBC 4.52 4.44  HGB 13.5 12.9*  HCT 39.8 39.3  MCV 88.1 88.5  MCH 29.9 29.1  RDW 12.4 12.5  PLT 192 175    Basename 12/07/12 0355 12/06/12 0406  NA 135 136  K 3.8 4.7  CL 100 102  CO2 27 27  GLUCOSE 133* 95  BUN 17 16  CREATININE 0.90 1.02  CALCIUM 8.9 9.2  Results for [facility] (MRN [id number]) as of 12/06/2012 11:11  Ref. Range 12/04/2012 16:39  Sed Rate Latest Range: 0-16 mm/hr 1    Studies/Results: No results found.  Scheduled Meds:    . enoxaparin (LOVENOX) injection  40 mg Subcutaneous Q24H  . OxyCODONE  40 mg Oral Q8H  . polyethylene glycol  17 g Oral BID  . rOPINIRole  1 mg Oral 5 X Daily  . sodium chloride  3 mL Intravenous Q12H  . vancomycin  500 mg Intravenous Q12H   Continuous Infusions:  PRN Meds:sodium chloride, alum &  mag hydroxide-simeth, bisacodyl, morphine injection, ondansetron (ZOFRAN) IV, ondansetron, oxyCODONE-acetaminophen, sodium chloride, traZODone  Assessment/Plan:  *Cellulitis and abscess of leg, except foot :  - no fevers, leukocytosis - on vanc day 3 for presumed MRSA therapy, given failed bactrim as outpatient . MRSA swab in patient is negative  - XRAY of knee ruled out osteomyelitis  - normal CRP and ESR  - noted improvement in the erythema, but worsening of the focal sight of infection  - will continue to monitor another day or two, and we should continue to see improvement - likely needs 10 days of vanc     Active Problems:  Chronic pain Doing well - cont home OxyContin and percocet  - add morphine IV prn for severe pain  Narcotic induced constipation  - colace and mirlax scheduled , increase miralax  - dulcolax prn severe constipation  PPx  - lovenox  FEN  - normal diet, KVO Dipso  - return to [facility]  - PT/OT to eval for need of DME  - will likely need IV abx after discharge to complete a 10-14 day course     LOS: 3 days   Jadis Pitter 12/07/2012, 7:48 AM

## 2012-12-07 NOTE — Progress Notes (Signed)
ANTIBIOTIC CONSULT NOTE - INITIAL  Pharmacy Consult for Vancomycin Indication: LLE cellulitis  Allergies  Allergen Reactions  . Peanuts (Peanut Oil) Other (See Comments)    Shortness of breath   Patient Measurements: Height: 6\' 2"  (188 cm) Weight: 149 lb 11.1 oz (67.9 kg) IBW/kg (Calculated) : 82.2   Vital Signs: Temp: 98.1 F (36.7 C) (01/06 0539) Temp src: Oral (01/06 0539) BP: 94/49 mmHg (01/06 0539) Pulse Rate: 64  (01/06 0539) Intake/Output from previous day: 01/05 0701 - 01/06 0700 In: 200 [IV Piggyback:200] Out: -  Intake/Output from this shift:   Labs: SCr: 1.15 CrCl: 45 ml/min WBC: 7.0  Microbiology: Recent Results (from the past 720 hour(s))  MRSA PCR SCREENING     Status: Normal   Collection Time   12/06/12  1:42 PM      Component Value Range Status Comment   MRSA by PCR NEGATIVE  NEGATIVE Final    Medications:  Scheduled:     . enoxaparin (LOVENOX) injection  40 mg Subcutaneous Q24H  . OxyCODONE  40 mg Oral Q8H  . polyethylene glycol  17 g Oral BID  . rOPINIRole  1 mg Oral 5 X Daily  . sodium chloride  3 mL Intravenous Q12H  . vancomycin  500 mg Intravenous Q12H   Assessment: 86 YOM with worsening LLE cellulitis despite outpatient tx with keflex, then bactrim. Planning 10 days Vancomycin  Goal of Therapy:  Vancomycin trough level 15-20 mcg/ml   Plan:   Vancomycin 1gm IV x 1, then 500mg  q12h Check trough tomorrow1/7 prior to 10am dose  Otho Bellows PharmD Pager: [phone] 12/07/2012,12:41 PM

## 2012-12-08 DIAGNOSIS — M4000 Postural kyphosis, site unspecified: Secondary | ICD-10-CM | POA: Diagnosis not present

## 2012-12-08 DIAGNOSIS — L0390 Cellulitis, unspecified: Secondary | ICD-10-CM | POA: Diagnosis not present

## 2012-12-08 DIAGNOSIS — L0100 Impetigo, unspecified: Secondary | ICD-10-CM | POA: Diagnosis not present

## 2012-12-08 DIAGNOSIS — G8929 Other chronic pain: Secondary | ICD-10-CM | POA: Diagnosis not present

## 2012-12-08 LAB — BASIC METABOLIC PANEL
CO2: 28 mEq/L (ref 19–32)
Chloride: 101 mEq/L (ref 96–112)
GFR calc non Af Amer: 75 mL/min — ABNORMAL LOW (ref >90)
Glucose, Bld: 98 mg/dL (ref 70–99)
Potassium: 4.3 mEq/L (ref 3.5–5.1)
Sodium: 135 mEq/L (ref 135–145)

## 2012-12-08 LAB — CBC
HCT: 41.7 % (ref 39.0–52.0)
Hemoglobin: 13.9 g/dL (ref 13.0–17.0)
MCH: 29.4 pg (ref 26.0–34.0)
MCV: 88.3 fL (ref 78.0–100.0)
RBC: 4.72 MIL/uL (ref 4.22–5.81)

## 2012-12-08 MED ORDER — SULFAMETHOXAZOLE-TRIMETHOPRIM 800-160 MG PO TABS: 1.0000 | ORAL_TABLET | Freq: Two times a day (BID) | ORAL | Status: DC

## 2012-12-08 MED ORDER — SULFAMETHOXAZOLE-TMP DS 800-160 MG PO TABS
1.0000 | ORAL_TABLET | Freq: Once | ORAL | Status: DC
  Filled 2012-12-08: qty 1

## 2012-12-08 MED ORDER — CLOBETASOL PROPIONATE 0.05 % EX CREA: TOPICAL_CREAM | CUTANEOUS | Status: DC

## 2012-12-08 NOTE — Discharge Summary (Signed)
Physician Discharge Summary    Henery Betzold  MR#: [id number]  DOB:[birth year]  Date of Admission: 12/04/2012 Date of Discharge: 12/08/2012  Attending Physician:Mallie Linnemann, Lorin Picket  Patient's PCP: Alysia Penna  Consults:Dr Terri Piedra, dermatology   Discharge Diagnoses: Principal Problem:  *Dermatitis   Active Problems:  Chronic pain   Discharge Medications:   Medication List     As of 12/08/2012  8:41 AM    TAKE these medications         clobetasol cream 0.05 %   Commonly known as: TEMOVATE   Please apply to skin lesion three times daily until clear.      fluticasone 50 MCG/ACT nasal spray   Commonly known as: FLONASE   Place 2 sprays into the nose daily.      oxyCODONE 20 MG 12 hr tablet   Commonly known as: OXYCONTIN   Take 60 mg by mouth every 12 (twelve) hours.      oxyCODONE-acetaminophen 10-325 MG per tablet   Commonly known as: PERCOCET   Take 1 tablet by mouth 4 (four) times daily. Pain      polyethylene glycol packet   Commonly known as: MIRALAX / GLYCOLAX   Take 17 g by mouth daily.      rOPINIRole 1 MG tablet   Commonly known as: REQUIP   Take 1 mg by mouth 5 (five) times daily.      sulfamethoxazole-trimethoprim 800-160 MG per tablet   Commonly known as: BACTRIM DS,SEPTRA DS   Take 1 tablet by mouth 2 (two) times daily.         Hospital Procedures: Dg Knee Complete 4 Views Left  12/04/2012  *RADIOLOGY REPORT*  Clinical Data: History of posterior knee pain and redness for several days.  LEFT KNEE - COMPLETE 4+ VIEW  Comparison: None.  Findings: There is no evidence of joint effusion.  Nonaneurysmal arterial calcifications are present.  There is narrowing of the medial joint space.  No fracture or bony destruction is seen.  No erosive change or chondrocalcinosis is evident.  IMPRESSION: Nonaneurysmal arterial calcifications.  Narrowing of medial joint space.   Original Report Authenticated By: Onalee Hua Call     History of Present Illness: Pt was admitted  w/ what was presumed cellulitis of the LLE that was refractory to therapy w/ keflex and 2 day course of bactrim as outpatient   Hospital Course:  Eczematous dermatitis vs cellulitis - Given concern this may be cellulitis, patient was placed on vanc IV per pharmacy dosing on admission. ESR and CRP were not elevated. XRay of the knee showed no signs of OM of the L knee. MRSA swab was negative. Pt remained afebrile and w/o leukocytosis. After 3 days of vanc IV, the skin lesion showed minimal improvement, so dermatology was consulted for further recommendations. Dr Terri Piedra felt this appeared to be more consistent w/ acute eczematous dermatitis, but that cellulitis cannot be definitively ruled out. They recommended placing the patient on clobetasol cream and following up for improvement w/ their office in 2 weeks, or sooner if needed. On day of discharge, the patient has been treated for cellulitis w/ bactrim PO and then IV vanc for a total of 7 days. Given the lack of improvement w/ appropriate abx coverage, the lack of systemic systems, and the neg MRSA swab, I agree that this picture does not clinically fit w/ a definitive diagnosis of cellulitis. The patient will be prescribed bactrim DS BID x 3 days to complete a 10 day course of antibiotics for possible  cellulitis and will be prescribed clobetasol 0.05% cream to apply TID until lesion clears. He will be contacted by my office tomorrow for follow up after discharge. He will call Dr Dorita Sciara office for discharge f/u as well. I have ordered HHPT per the recs of the inpatient PT department. At time of discharge, the patient stated that he was feeling much better, his pain in his leg was improving, and he was having normal BMs.   Day of Discharge Exam BP 121/61  Pulse 64  Temp 97.9 F (36.6 C) (Oral)  Resp 20  Ht 6\' 2"  (1.88 m)  Wt 149 lb 11.1 oz (67.9 kg)  BMI 19.22 kg/m2  SpO2 97%  Physical Exam: General appearance: elderly male in NAD  Eyes: no  scleral icterus Throat: oropharynx moist without erythema Resp: CTAB, no wheezes or rales  Cardio: RRR, no MRG  GI: soft, non-tender; bowel sounds normal; no masses,  no organomegaly Extremities: no clubbing, cyanosis or edema; 2x4 scabbed, crusted lesion present on the medial aspect of his L knee w/ 2cm of surrounding erythema; no active discharge; minimally TTP   Discharge Labs:  [facility] 12/08/12 0400 12/07/12 0355  NA 135 135  K 4.3 3.8  CL 101 100  CO2 28 27  GLUCOSE 98 133*  BUN 19 17  CREATININE 0.93 0.90  CALCIUM 9.3 8.9  MG -- --  PHOS -- --   No results found for this basename: AST:2,ALT:2,ALKPHOS:2,BILITOT:2,PROT:2,ALBUMIN:2 in the last 72 hours  Basename 12/08/12 0400 12/07/12 0355  WBC 6.8 7.0  NEUTROABS -- --  HGB 13.9 13.5  HCT 41.7 39.8  MCV 88.3 88.1  PLT 189 192   Lab Results  Component Value Date   INR 1.05 12/04/2012   No results found for this basename: CKTOTAL:3,CKMB:3,CKMBINDEX:3,TROPONINI:3 in the last 72 hours No results found for this basename: TSH,T4TOTAL,FREET3,T3FREE,THYROIDAB in the last 72 hours No results found for this basename: VITAMINB12:2,FOLATE:2,FERRITIN:2,TIBC:2,IRON:2,RETICCTPCT:2 in the last 72 hours  Discharge instructions: Discharge Orders    Future Orders Please Complete By Expires   Diet - low sodium heart healthy      Increase activity slowly      Discharge instructions      Comments:   If no improvement in the appearance of the skin condition, please call Dr Elease Etienne office.  If develop worsening systemic symptoms (fevers, chills, nausea), please call Dr Alphonsus Sias office or go to the ED.   No wound care      Call MD for:  temperature >100.4      Call MD for:  persistant nausea and vomiting        Final discharge disposition not confirmed   Disposition: Back to [facility]   Follow-up Appts: Follow-up with Dr. Link Snuffer at [facility] in 1-2 weeks.  Call for  appointment.  Condition on Discharge: stable   Tests Needing Follow-up: none   Time spent in discharge (includes decision making & examination of pt): 45 minutes    Signed: Michaiah Holsopple 12/08/2012, 8:41 AM

## 2012-12-08 NOTE — Evaluation (Signed)
Occupational Therapy Evaluation Patient Details Name: Casey Gates MRN: [id number] DOB: March 24, 1926 Today's Date: 12/08/2012 Time: 4098-1191 OT Time Calculation (min): 20 min  OT Assessment / Plan / Recommendation Clinical Impression  This 79 year old man was admitted for cellulitis and abscess of LLE.  He does not need any further OT.  PT has recommended HHPT:  they can look at apartment for safety.  Pt has a high commode and walk in shower with a seat.      OT Assessment  Patient does not need any further OT services    Follow Up Recommendations  No OT follow up; HHPT will follow and can complete safety eval    Barriers to Discharge      Equipment Recommendations  None recommended by OT    Recommendations for Other Services    Frequency       Precautions / Restrictions Precautions Precautions: Fall Restrictions Weight Bearing Restrictions: No   Pertinent Vitals/Pain No pain    ADL  Lower Body Dressing: Performed;Set up Where Assessed - Lower Body Dressing: Supported sit to stand Toilet Transfer: Research scientist (life sciences) Method: Sit to Barista: Raised toilet seat with arms (or 3-in-1 over toilet) Toileting - Clothing Manipulation and Hygiene: Simulated;Modified independent Where Assessed - Toileting Clothing Manipulation and Hygiene: Sit to stand from 3-in-1 or toilet Tub/Shower Transfer: Performed;Min guard--shower in room did not have a grab bar within reach:  His at [facility] does:  Feel he will be able to do this at his home.  He only showers 1x week due to skin condition Tub/Shower Transfer Method: Science writer: Walk in shower Transfers/Ambulation Related to ADLs: ambulated to bathroom at supervision level; pt initially began to leave walker outside of bathroom as he does at home.  He does have room to bring this in, and I recommended this.   ADL Comments: Educated on energy conservation.  Pt  has things conveniently located in apt and he has a reacher also.      OT Diagnosis:    OT Problem List:   OT Treatment Interventions:     OT Goals    Visit Information  Last OT Received On: 12/08/12 Assistance Needed: +1    Subjective Data  Subjective: What do you want me to do Patient Stated Goal: Get back to [facility] today; wants motorized scooter/w/c which he has requested.     Prior Functioning     Home Living Bathroom Shower/Tub: Walk-in shower Bathroom Toilet: Handicapped height Bathroom Accessibility: Yes How Accessible: Accessible via walker Prior Function Level of Independence: Independent with assistive device(s) Vocation: Retired Comments: has long walk to dining room; has 4 wheel walker Communication Communication: No difficulties         Vision/Perception     Cognition  Overall Cognitive Status: Appears within functional limits for tasks assessed/performed Arousal/Alertness: Awake/alert Orientation Level: Appears intact for tasks assessed Behavior During Session: [facility] for tasks performed    Extremity/Trunk Assessment Right Upper Extremity Assessment RUE ROM/Strength/Tone: [facility] for tasks assessed Left Upper Extremity Assessment LUE ROM/Strength/Tone: WFL for tasks assessed     Mobility Transfers Sit to Stand: 6: Modified independent (Device/Increase time);From toilet;With upper extremity assist;From bed     Shoulder Instructions     Exercise     Balance     End of Session OT - End of Session Activity Tolerance: Patient tolerated treatment well Patient left: in bed;with call bell/phone within reach  GO     [facility] 12/08/2012,  9:13 AM Marica Otter, OTR/L [phone] 12/08/2012

## 2012-12-08 NOTE — Progress Notes (Signed)
Patient discharged back to [facility]. No social work needs. CSW signed off.   Jacklynn Lewis, MSW, LCSWA  Clinical Social Work [phone]

## 2012-12-08 NOTE — Progress Notes (Signed)
Spoke with pt concerning discharge plans and home health.  Pt states that he do not want HHPT at this time.

## 2012-12-14 DIAGNOSIS — L0100 Impetigo, unspecified: Secondary | ICD-10-CM | POA: Diagnosis not present

## 2012-12-20 DIAGNOSIS — R300 Dysuria: Secondary | ICD-10-CM | POA: Diagnosis not present

## 2012-12-23 DIAGNOSIS — N390 Urinary tract infection, site not specified: Secondary | ICD-10-CM | POA: Diagnosis not present

## 2012-12-23 DIAGNOSIS — R5383 Other fatigue: Secondary | ICD-10-CM | POA: Diagnosis not present

## 2012-12-23 DIAGNOSIS — L259 Unspecified contact dermatitis, unspecified cause: Secondary | ICD-10-CM | POA: Diagnosis not present

## 2012-12-23 DIAGNOSIS — Z0000 Encounter for general adult medical examination without abnormal findings: Secondary | ICD-10-CM | POA: Diagnosis not present

## 2012-12-23 DIAGNOSIS — R5381 Other malaise: Secondary | ICD-10-CM | POA: Diagnosis not present

## 2012-12-29 DIAGNOSIS — M4120 Other idiopathic scoliosis, site unspecified: Secondary | ICD-10-CM | POA: Diagnosis not present

## 2012-12-29 DIAGNOSIS — H5440 Blindness, one eye, unspecified eye: Secondary | ICD-10-CM | POA: Diagnosis not present

## 2012-12-29 DIAGNOSIS — G8929 Other chronic pain: Secondary | ICD-10-CM | POA: Diagnosis not present

## 2012-12-29 DIAGNOSIS — L02419 Cutaneous abscess of limb, unspecified: Secondary | ICD-10-CM | POA: Diagnosis not present

## 2012-12-29 DIAGNOSIS — M545 Low back pain: Secondary | ICD-10-CM | POA: Diagnosis not present

## 2012-12-29 DIAGNOSIS — L03119 Cellulitis of unspecified part of limb: Secondary | ICD-10-CM | POA: Diagnosis not present

## 2012-12-31 ENCOUNTER — Inpatient Hospital Stay (HOSPITAL_COMMUNITY)
Admission: EM | Admit: 2012-12-31 | Discharge: 2013-01-01 | DRG: 312 | Disposition: A | Discharge status: Home Health | Payer: Medicare Other | Attending: Internal Medicine | Admitting: Internal Medicine

## 2012-12-31 ENCOUNTER — Emergency Department (HOSPITAL_COMMUNITY): Payer: Medicare Other

## 2012-12-31 DIAGNOSIS — I452 Bifascicular block: Secondary | ICD-10-CM | POA: Diagnosis present

## 2012-12-31 DIAGNOSIS — E46 Unspecified protein-calorie malnutrition: Secondary | ICD-10-CM | POA: Diagnosis present

## 2012-12-31 DIAGNOSIS — K5900 Constipation, unspecified: Secondary | ICD-10-CM | POA: Diagnosis present

## 2012-12-31 DIAGNOSIS — R55 Syncope and collapse: Principal | ICD-10-CM | POA: Diagnosis present

## 2012-12-31 DIAGNOSIS — H5440 Blindness, one eye, unspecified eye: Secondary | ICD-10-CM | POA: Diagnosis present

## 2012-12-31 DIAGNOSIS — N189 Chronic kidney disease, unspecified: Secondary | ICD-10-CM | POA: Diagnosis present

## 2012-12-31 DIAGNOSIS — G2581 Restless legs syndrome: Secondary | ICD-10-CM | POA: Diagnosis present

## 2012-12-31 DIAGNOSIS — Z8546 Personal history of malignant neoplasm of prostate: Secondary | ICD-10-CM

## 2012-12-31 DIAGNOSIS — Z79899 Other long term (current) drug therapy: Secondary | ICD-10-CM | POA: Diagnosis not present

## 2012-12-31 DIAGNOSIS — R404 Transient alteration of awareness: Secondary | ICD-10-CM | POA: Diagnosis not present

## 2012-12-31 DIAGNOSIS — J986 Disorders of diaphragm: Secondary | ICD-10-CM | POA: Diagnosis not present

## 2012-12-31 DIAGNOSIS — G8929 Other chronic pain: Secondary | ICD-10-CM | POA: Diagnosis not present

## 2012-12-31 LAB — CBC WITH DIFFERENTIAL/PLATELET
Basophils Relative: 0 % (ref 0–1)
HCT: 40 % (ref 39.0–52.0)
Hemoglobin: 13.1 g/dL (ref 13.0–17.0)
MCHC: 32.8 g/dL (ref 30.0–36.0)
Monocytes Absolute: 0.6 10*3/uL (ref 0.1–1.0)
Monocytes Relative: 9 % (ref 3–12)
Neutro Abs: 4.3 10*3/uL (ref 1.7–7.7)

## 2012-12-31 LAB — URINALYSIS, ROUTINE W REFLEX MICROSCOPIC
Ketones, ur: NEGATIVE mg/dL
Leukocytes, UA: NEGATIVE
Nitrite: NEGATIVE
Protein, ur: NEGATIVE mg/dL
Urobilinogen, UA: 0.2 mg/dL (ref 0.0–1.0)

## 2012-12-31 LAB — GLUCOSE, CAPILLARY: Glucose-Capillary: 96 mg/dL (ref 70–99)

## 2012-12-31 LAB — COMPREHENSIVE METABOLIC PANEL
Albumin: 3.2 g/dL — ABNORMAL LOW (ref 3.5–5.2)
BUN: 19 mg/dL (ref 6–23)
CO2: 29 mEq/L (ref 19–32)
Chloride: 106 mEq/L (ref 96–112)
Creatinine, Ser: 1.09 mg/dL (ref 0.50–1.35)
GFR calc Af Amer: 70 mL/min — ABNORMAL LOW (ref >90)
GFR calc non Af Amer: 60 mL/min — ABNORMAL LOW (ref >90)
Total Bilirubin: 0.3 mg/dL (ref 0.3–1.2)

## 2012-12-31 LAB — PRO B NATRIURETIC PEPTIDE: Pro B Natriuretic peptide (BNP): 286 pg/mL (ref 0–450)

## 2012-12-31 LAB — TROPONIN I: Troponin I: <0.3 ng/mL (ref <0.30)

## 2012-12-31 MED ORDER — SORBITOL 70 % SOLN
30.0000 mL | Freq: Every day | Status: DC | PRN
  Filled 2012-12-31: qty 30

## 2012-12-31 MED ORDER — OXYCODONE HCL 5 MG PO TABS
5.0000 mg | ORAL_TABLET | Freq: Four times a day (QID) | ORAL | Status: DC
  Administered 2012-12-31 (×2): 5 mg via ORAL
  Filled 2012-12-31 (×2): qty 1

## 2012-12-31 MED ORDER — OXYCODONE-ACETAMINOPHEN 10-325 MG PO TABS: 1.0000 | ORAL_TABLET | Freq: Four times a day (QID) | ORAL | Status: DC

## 2012-12-31 MED ORDER — SODIUM CHLORIDE 0.9 % IV SOLN: 250.0000 mL | INTRAVENOUS | Status: DC | PRN

## 2012-12-31 MED ORDER — BOOST PLUS PO LIQD
237.0000 mL | Freq: Three times a day (TID) | ORAL | Status: DC
  Administered 2012-12-31 – 2013-01-01 (×4): 237 mL via ORAL
  Filled 2012-12-31 (×7): qty 237

## 2012-12-31 MED ORDER — ALUM & MAG HYDROXIDE-SIMETH 200-200-20 MG/5ML PO SUSP: 30.0000 mL | Freq: Four times a day (QID) | ORAL | Status: DC | PRN

## 2012-12-31 MED ORDER — TRIAMCINOLONE ACETONIDE 0.1 % EX CREA
TOPICAL_CREAM | Freq: Every day | CUTANEOUS | Status: DC
  Filled 2012-12-31: qty 15

## 2012-12-31 MED ORDER — ROPINIROLE HCL 1 MG PO TABS
1.0000 mg | ORAL_TABLET | Freq: Every day | ORAL | Status: DC
  Administered 2012-12-31 – 2013-01-01 (×4): 1 mg via ORAL
  Filled 2012-12-31 (×9): qty 1

## 2012-12-31 MED ORDER — IBUPROFEN 600 MG PO TABS: 600.0000 mg | ORAL_TABLET | Freq: Once | ORAL | Status: DC

## 2012-12-31 MED ORDER — OXYCODONE-ACETAMINOPHEN 5-325 MG PO TABS
1.0000 | ORAL_TABLET | Freq: Four times a day (QID) | ORAL | Status: DC
  Administered 2012-12-31 – 2013-01-01 (×4): 1 via ORAL
  Filled 2012-12-31 (×5): qty 1

## 2012-12-31 MED ORDER — DOCUSATE SODIUM 100 MG PO CAPS
100.0000 mg | ORAL_CAPSULE | Freq: Two times a day (BID) | ORAL | Status: DC
  Administered 2012-12-31 – 2013-01-01 (×2): 100 mg via ORAL
  Filled 2012-12-31 (×4): qty 1

## 2012-12-31 MED ORDER — ONDANSETRON HCL 4 MG PO TABS: 4.0000 mg | ORAL_TABLET | Freq: Four times a day (QID) | ORAL | Status: DC | PRN

## 2012-12-31 MED ORDER — SENNA 8.6 MG PO TABS
1.0000 | ORAL_TABLET | Freq: Two times a day (BID) | ORAL | Status: DC
  Administered 2012-12-31: 8.6 mg via ORAL
  Filled 2012-12-31 (×4): qty 1

## 2012-12-31 MED ORDER — ONDANSETRON HCL 4 MG/2ML IJ SOLN: 4.0000 mg | Freq: Four times a day (QID) | INTRAMUSCULAR | Status: DC | PRN

## 2012-12-31 MED ORDER — SODIUM CHLORIDE 0.9 % IJ SOLN
3.0000 mL | Freq: Two times a day (BID) | INTRAMUSCULAR | Status: DC
  Administered 2012-12-31 – 2013-01-01 (×2): 3 mL via INTRAVENOUS

## 2012-12-31 MED ORDER — ENOXAPARIN SODIUM 40 MG/0.4ML ~~LOC~~ SOLN
40.0000 mg | SUBCUTANEOUS | Status: DC
  Administered 2012-12-31 – 2013-01-01 (×2): 40 mg via SUBCUTANEOUS
  Filled 2012-12-31 (×2): qty 0.4

## 2012-12-31 MED ORDER — POLYETHYLENE GLYCOL 3350 17 G PO PACK
17.0000 g | PACK | Freq: Every day | ORAL | Status: DC
  Filled 2012-12-31 (×2): qty 1

## 2012-12-31 MED ORDER — FLUTICASONE PROPIONATE 50 MCG/ACT NA SUSP
1.0000 | Freq: Every day | NASAL | Status: DC
  Administered 2013-01-01: 1 via NASAL
  Filled 2012-12-31: qty 16

## 2012-12-31 MED ORDER — OXYCODONE HCL ER 40 MG PO T12A
60.0000 mg | EXTENDED_RELEASE_TABLET | Freq: Two times a day (BID) | ORAL | Status: DC
  Administered 2012-12-31 – 2013-01-01 (×2): 60 mg via ORAL
  Filled 2012-12-31 (×2): qty 1

## 2012-12-31 MED ORDER — ZOLPIDEM TARTRATE 5 MG PO TABS: 5.0000 mg | ORAL_TABLET | Freq: Every evening | ORAL | Status: DC | PRN

## 2012-12-31 NOTE — ED Provider Notes (Signed)
I saw and evaluated the patient, reviewed the resident's note and I agree with the findings and plan.  Pt with syncope x 3 without prodromal sx-denies head or neck injury, ecg without signs of acs, labs/xrays pending, anticipate admission  Toy Baker, MD 12/31/12 1008

## 2012-12-31 NOTE — ED Notes (Signed)
Per report pt states that he has been having "passing out spells" unknown down times.  No trauma noted.  EMS reports that the pt was noted to appear to fall asleep on the transport but was arosable by verbal (calling his name).  CBG on scene was 135 (cbg of 96 in the ER).  No distress noted.  Denies any CP, SOB, or HA.

## 2012-12-31 NOTE — H&P (Signed)
Physician Admission History and Physical     PCP:   Alysia Penna, MD  Chief Complaint:  Syncope   HPI: Casey Gates is an 79 y.o. male.  Pt presents after experiencing 3 episodes where he would be sitting up, experience about 5-10 seconds of blacking out, and then come back to. He experiences no postictal state. Not clearly associated w/ positional changes. No changes in speech or focal weakness. He has had not recent med changes, decreased PO intake, dehydration. In the ED his CXR, CT head were unremarkable. Per report his orthostatics were normal. His CBC and CMP also unremarkable. EKG showed normal intervals w/ incomplete RBBB, but normal rate.   Of note, his LLE is noting great improvement from the dermatitis that he is being treated for. He denies any fevers or chills.   Review of Systems:  ROS negative except as noted above.   Past Medical History: Past Medical History  Diagnosis Date  . Blind left eye   . Restless leg   . Asthma     AS A CHILD  . Chronic kidney disease   . Cancer     PROSTATE CANCER   Past Surgical History  Procedure Date  . Prostatectomy   . Rigth nephrectomy 2006  . Tonsillectomy   . Right cataract extraction 2013    Medications: Prior to Admission medications   Medication Sig Start Date End Date Taking? Authorizing Provider  clobetasol cream (TEMOVATE) 0.05 % Apply 1 application topically 2 (two) times daily.   Yes Historical Provider, MD  fluticasone (FLONASE) 50 MCG/ACT nasal spray Place 2 sprays into the nose daily.   Yes Historical Provider, MD  oxyCODONE (OXYCONTIN) 20 MG 12 hr tablet Take 40-60 mg by mouth every 12 (twelve) hours. For pain   Yes Historical Provider, MD  oxyCODONE-acetaminophen (PERCOCET) 10-325 MG per tablet Take 1 tablet by mouth 4 (four) times daily.    Yes Historical Provider, MD  polyethylene glycol (MIRALAX / GLYCOLAX) packet Take 17 g by mouth daily.   Yes Historical Provider, MD  rOPINIRole (REQUIP) 1 MG tablet  Take 1 mg by mouth 5 (five) times daily.    Yes Historical Provider, MD    Allergies:   Allergies  Allergen Reactions  . Peanuts (Peanut Oil) Other (See Comments)    Shortness of breath    Social History:  reports that he has never smoked. He has never used smokeless tobacco. He reports that he does not drink alcohol or use illicit drugs.  Family History: Family History  Problem Relation Age of Onset  . Bone cancer Mother     Physical Exam: Filed Vitals:   12/31/12 0940 12/31/12 0942 12/31/12 0942 12/31/12 0945  BP: 141/77 127/63 127/71 139/76  Pulse: 68 68 78 78  Temp: 98.6 F (37 C)     TempSrc: Oral     Resp: 16     Height: 6\' 2"  (1.88 m)     Weight: 150 lb (68.04 kg)     SpO2: 98%      General: WM in NAD.  HEENT: moist membranes, EOMI. Trachea midline. No cervical LAD CV: 2+ radial and DP pulses. RRR. No MRG. S1S2 appreciated. No carotid bruits B/L Lung: CTAB. No wheezes or rales  Abd: well healed scar present. Normal BS. No tenderness or masses  Ext: normal strength in all extremities. No impaired ROM.  Neuro: 5/5 strength in UE and LE. Normal reflexes. AAOx4.   Labs on Admission:   [facility] 12/31/12 1010  NA 140  K 4.2  CL 106  CO2 29  GLUCOSE 101*  BUN 19  CREATININE 1.09  CALCIUM 8.8  MG --  PHOS --    Basename 12/31/12 1010  AST 21  ALT 14  ALKPHOS 57  BILITOT 0.3  PROT 5.8*  ALBUMIN 3.2*   No results found for this basename: LIPASE:2,AMYLASE:2 in the last 72 hours  Basename 12/31/12 1010  WBC 6.5  NEUTROABS 4.3  HGB 13.1  HCT 40.0  MCV 90.1  PLT 161    Basename 12/31/12 1010  CKTOTAL --  CKMB --  CKMBINDEX --  TROPONINI <0.30   Lab Results  Component Value Date   INR 1.05 12/04/2012   No results found for this basename: TSH,T4TOTAL,FREET3,T3FREE,THYROIDAB in the last 72 hours No results found for this basename: VITAMINB12:2,FOLATE:2,FERRITIN:2,TIBC:2,IRON:2,RETICCTPCT:2 in the last 72 hours  Radiological Exams on  Admission: Dg Chest 2 View  12/31/2012  *RADIOLOGY REPORT*  Clinical Data: Syncope.  CHEST - 2 VIEW  Comparison: None.  Findings: Elevation of right hemidiaphragm noted.  Both lungs are clear.  No evidence of pleural effusion.  Heart size is within normal limits.  No mass or lymphadenopathy identified.  IMPRESSION: No active disease.   Original Report Authenticated By: Myles Rosenthal, M.D.    Ct Head Wo Contrast  12/31/2012  *RADIOLOGY REPORT*  Clinical Data: Loss of consciousness.  CT HEAD WITHOUT CONTRAST  Technique:  Contiguous axial images were obtained from the base of the skull through the vertex without contrast.  Comparison: None.  Findings: Mild age appropriate atrophy. No acute intracranial abnormality.  Specifically, no hemorrhage, hydrocephalus, mass lesion, acute infarction, or significant intracranial injury.  No acute calvarial abnormality. Visualized paranasal sinuses and mastoids clear.  Orbital soft tissues unremarkable.  IMPRESSION: No acute intracranial abnormality.   Original Report Authenticated By: Charlett Nose, M.D.    Dg Knee Complete 4 Views Left  12/04/2012  *RADIOLOGY REPORT*  Clinical Data: History of posterior knee pain and redness for several days.  LEFT KNEE - COMPLETE 4+ VIEW  Comparison: None.  Findings: There is no evidence of joint effusion.  Nonaneurysmal arterial calcifications are present.  There is narrowing of the medial joint space.  No fracture or bony destruction is seen.  No erosive change or chondrocalcinosis is evident.  IMPRESSION: Nonaneurysmal arterial calcifications.  Narrowing of medial joint space.   Original Report Authenticated By: Onalee Hua Call    Orders placed during the hospital encounter of 12/31/12  . EKG 12-LEAD  . EKG 12-LEAD    Assessment/Plan Principal Problem:  *Syncope  - consult cardiology and appreciate their recs. Given age, this could very well be cardiac in nature.   - repeat orthostatics on the floor.   - no need for fluids at this  time given normal PO intake and normal BP and HR   - check TSH   - telemetry   - does not appear to be 2/2 seizures   - likely not 2/2 medications given no recent changes   - no carotid bruits on exam   - EKG normal intervals and rate in ED   Active Problems:  Chronic pain   - cont home meds   Medication induced constipation   - colace, senna, miralax scheduled   - sorbitol prn   Malnutrition   - boost supplements   - reg diet   FEN   - SLIV  - boost + normal diet   PPx   - lovenox   -  no GI indicated  Full code    Ed Rayson 12/31/2012, 12:12 PM

## 2012-12-31 NOTE — ED Notes (Signed)
Patient transported to X-ray 

## 2012-12-31 NOTE — ED Provider Notes (Signed)
History     CSN: [id number]  Arrival date & time 12/31/12  1191   First MD Initiated Contact with Patient 12/31/12 0932      Chief Complaint  Patient presents with  . Loss of Consciousness    (Consider location/radiation/quality/duration/timing/severity/associated sxs/prior treatment) Patient is a 79 y.o. male presenting with syncope. The history is provided by the patient.  Loss of Consciousness This is a new problem. The current episode started yesterday. The problem occurs 2 to 4 times per day. The problem has been waxing and waning. Pertinent negatives include no abdominal pain, arthralgias, chest pain, congestion, fatigue, fever, headaches, nausea, numbness, rash, visual change, vomiting or weakness. Nothing aggravates the symptoms. He has tried nothing for the symptoms.    Past Medical History  Diagnosis Date  . Blind left eye   . Restless leg   . Asthma     AS A CHILD  . Chronic kidney disease   . Cancer     PROSTATE CANCER    Past Surgical History  Procedure Date  . Prostatectomy   . Rigth nephrectomy 2006  . Tonsillectomy   . Right cataract extraction 2013    Family History  Problem Relation Age of Onset  . Bone cancer Mother     History  Substance Use Topics  . Smoking status: Never Smoker   . Smokeless tobacco: Never Used  . Alcohol Use: No     Comment: QUIT ALCOHOL AT AGE 6      Review of Systems  Constitutional: Positive for appetite change and unexpected weight change. Negative for fever and fatigue.  HENT: Negative for congestion, rhinorrhea and postnasal drip.   Eyes: Negative for photophobia and visual disturbance.  Respiratory: Negative for chest tightness, shortness of breath and wheezing.   Cardiovascular: Positive for syncope. Negative for chest pain, palpitations and leg swelling.  Gastrointestinal: Negative for nausea, vomiting, abdominal pain and diarrhea.  Genitourinary: Negative for urgency, frequency and difficulty urinating.   Musculoskeletal: Negative for back pain and arthralgias.  Skin: Negative for rash and wound.  Neurological: Positive for syncope. Negative for weakness, numbness and headaches.  Psychiatric/Behavioral: Negative for confusion and agitation.    Allergies  Peanuts  Home Medications   Current Outpatient Rx  Name  Route  Sig  Dispense  Refill  . CLOBETASOL PROPIONATE 0.05 % EX CREA   Topical   Apply 1 application topically 2 (two) times daily.         Marland Kitchen FLUTICASONE PROPIONATE 50 MCG/ACT NA SUSP   Nasal   Place 2 sprays into the nose daily.         . OXYCODONE HCL ER 20 MG PO TB12   Oral   Take 40-60 mg by mouth every 12 (twelve) hours. For pain         . OXYCODONE-ACETAMINOPHEN 10-325 MG PO TABS   Oral   Take 1 tablet by mouth 4 (four) times daily.          Marland Kitchen POLYETHYLENE GLYCOL 3350 PO PACK   Oral   Take 17 g by mouth daily.         Marland Kitchen ROPINIROLE HCL 1 MG PO TABS   Oral   Take 1 mg by mouth 5 (five) times daily.            BP 139/76  Pulse 78  Temp 98.6 F (37 C) (Oral)  Resp 16  Ht 6\' 2"  (1.88 m)  Wt 150 lb (68.04 kg)  BMI 19.26 kg/m2  SpO2 98%  Physical Exam  Nursing note and vitals reviewed. Constitutional: He is oriented to person, place, and time. He appears well-developed and well-nourished. No distress.  HENT:  Head: Normocephalic and atraumatic.  Mouth/Throat: Oropharynx is clear and moist.  Eyes: EOM are normal. Pupils are equal, round, and reactive to light.  Neck: Normal range of motion. Neck supple.  Cardiovascular: Normal rate, regular rhythm and intact distal pulses.   Murmur heard.  Systolic murmur is present with a grade of 3/6   Diastolic murmur is present with a grade of 3/6  Pulses:      Carotid pulses are on the right side with bruit. Pulmonary/Chest: Effort normal and breath sounds normal. He has no wheezes. He has no rales.  Abdominal: Soft. Bowel sounds are normal. He exhibits no distension. There is no tenderness. There  is no rebound and no guarding.  Musculoskeletal: Normal range of motion. He exhibits no edema and no tenderness.  Lymphadenopathy:    He has no cervical adenopathy.  Neurological: He is alert and oriented to person, place, and time. He displays normal reflexes. No cranial nerve deficit. He exhibits normal muscle tone. Coordination normal.  Skin: Skin is warm and dry. No rash noted.  Psychiatric: He has a normal mood and affect. His behavior is normal.    ED Course  Procedures (including critical care time)   Date: 12/31/2012  Rate: 69  Rhythm: normal sinus rhythm  QRS Axis: left  Intervals: normal  ST/T Wave abnormalities: normal  Conduction Disutrbances:none  Narrative Interpretation:   Old EKG Reviewed: none available    Labs Reviewed  COMPREHENSIVE METABOLIC PANEL - Abnormal; Notable for the following:    Glucose, Bld 101 (*)     Total Protein 5.8 (*)     Albumin 3.2 (*)     GFR calc non Af Amer 60 (*)     GFR calc Af Amer 70 (*)     All other components within normal limits  GLUCOSE, CAPILLARY  CBC WITH DIFFERENTIAL  TROPONIN I  URINALYSIS, ROUTINE W REFLEX MICROSCOPIC   Ct Head Wo Contrast  12/31/2012  *RADIOLOGY REPORT*  Clinical Data: Loss of consciousness.  CT HEAD WITHOUT CONTRAST  Technique:  Contiguous axial images were obtained from the base of the skull through the vertex without contrast.  Comparison: None.  Findings: Mild age appropriate atrophy. No acute intracranial abnormality.  Specifically, no hemorrhage, hydrocephalus, mass lesion, acute infarction, or significant intracranial injury.  No acute calvarial abnormality. Visualized paranasal sinuses and mastoids clear.  Orbital soft tissues unremarkable.  IMPRESSION: No acute intracranial abnormality.   Original Report Authenticated By: Charlett Nose, M.D.      1. Syncope       MDM   [phone] with pmhx of chronic back pain, CKD here with 3 syncopal episodes since last night. Episodes seem to last only  seconds without preceding symptoms such as chest pain, dyspnea, or dizziness. Has had poor PO intake for the last month. No fevers, vomiting, or URI symptoms. Exam as noted above. Likely cardiogenic. No concern for traumatic injury. EKG without arrhythmia or ischemic changes. Will obtain labs, troponin, ua, and head CT. Will plan for admission.   11:13 Labs unrevealing. Normal troponin. Creatinine at 1. Head CT negative for acute pathology. Hemoglobin stable at 13. Orthostatics BP's normal. Will admit to Dr. Link Snuffer.     Johnnette Gourd, MD 12/31/12 [phone]

## 2012-12-31 NOTE — Consult Note (Addendum)
Admit date: 12/31/2012 Primary Physician  Dr. Link Snuffer Referring Physician   Dr. Link Snuffer Primary Cardiologist  Wayne Both, III, M.D. Reason for Consultation  recurring syncope  ASSESSMENT: 1. Syncope, uncertain cause, but with conduction system disease on EKG, transient high-grade A-V block/Stokes-Adams syncope needs to be excluded. R/O pharmacologic effect REQUIP, which according to pharmacy is a high dose in absence of Parkinson's  2. Bifascicular block on EKG: Incomplete right bundle branch block with left anterior fascicular block. PR interval 170 ms  3. Chronic kidney disease  4. Prostate cancer  PLAN:  1. Monitor on telemetry. May need to be substantially longer period of monitoring with an outpatient telemetry unit if no obvious abnormality noted at this time. This would be especially important if symptoms continue.  2. Cardiac biomarkers, TSH.  3. Since the patient's symptoms are concerning and recurring problem, he may require EP testing of his conduction system which may facilitate pacemaker implantation before an episode of syncope causes injury.  4. 2-D Doppler echocardiogram if not recently done  5. Consider adjusting the dose of Requip.   HPI: The patient gives a story of 3 episodes of near syncope/syncope. His bit difficult to feel comfortable that these were episodes of syncope because he did not lose muscle tone, fall, or have noticeable difficulty. The last episode was witnessed by friends. He does state he feels that his vision disappeared and that he missed several seconds before he would come back to full consciousness. He is blind in his left eye. There are no definite other complaints. No nausea, diaphoresis, urge to defecate or urinate, shaking, tongue biting, chest pain, or palpitations. He has never had syncope. He feels perfectly normal. All 3 episodes occurred earlier this morning. The last episode occurred while he was at breakfast sitting with  friends.   PMH:   Past Medical History  Diagnosis Date  . Blind left eye   . Restless leg   . Asthma     AS A CHILD  . Chronic kidney disease   . Cancer     PROSTATE CANCER     PSH:   Past Surgical History  Procedure Date  . Prostatectomy   . Rigth nephrectomy 2006  . Tonsillectomy   . Right cataract extraction 2013    Allergies:  Peanuts Prior to Admit Meds:   Prescriptions prior to admission  Medication Sig Dispense Refill  . clobetasol cream (TEMOVATE) 0.05 % Apply 1 application topically 2 (two) times daily.      . fluticasone (FLONASE) 50 MCG/ACT nasal spray Place 2 sprays into the nose daily.      Marland Kitchen oxyCODONE (OXYCONTIN) 20 MG 12 hr tablet Take 40-60 mg by mouth every 12 (twelve) hours. For pain      . oxyCODONE-acetaminophen (PERCOCET) 10-325 MG per tablet Take 1 tablet by mouth 4 (four) times daily.       . polyethylene glycol (MIRALAX / GLYCOLAX) packet Take 17 g by mouth daily.      Marland Kitchen rOPINIRole (REQUIP) 1 MG tablet Take 1 mg by mouth 5 (five) times daily.        Fam HX:    Family History  Problem Relation Age of Onset  . Bone cancer Mother    Social HX:    History   Social History  . Marital Status: Widowed    Spouse Name: N/A    Number of Children: N/A  . Years of Education: N/A   Occupational History  . Not  on file.   Social History Main Topics  . Smoking status: Never Smoker   . Smokeless tobacco: Never Used  . Alcohol Use: No     Comment: QUIT ALCOHOL AT AGE 34  . Drug Use: No  . Sexually Active: No   Other Topics Concern  . Not on file   Social History Narrative  . No narrative on file     Review of Systems: skin disorder that causes him pruritus and other complaints.he denies history of heart murmur, abnormal EKG, heart attack, recent medication changes, change in exertional tolerance, edema, and neurological complaint.  Physical Exam: Blood pressure 139/76, pulse 78, temperature 98.6 F (37 C), temperature source Oral, resp. rate  16, height 6\' 2"  (1.88 m), weight 68.176 kg (150 lb 4.8 oz), SpO2 98.00%. Weight change:   Elderly gentleman initially asleep when we met. He awakened and was able to engage in conversation without difficulty.  Skin is warm and dry. No cyanosis is noted.  The neck exam is somewhat difficult but there is no obvious evidence of JVD. No carotid bruits are heard.  Chest is clear anteriorly and posteriorly.  Cardiac exam reveals a one of 6 crescendo decrescendo right upper sternal border murmur  Abdomen is soft. No tenderness. Bowel sounds are normal.  Extremities reveal patent/palpable radial and posterior tibial pulses.  Neurological exam reveals no motor or sensory deficits. Labs:   Lab Results  Component Value Date   WBC 6.5 12/31/2012   HGB 13.1 12/31/2012   HCT 40.0 12/31/2012   MCV 90.1 12/31/2012   PLT 161 12/31/2012    Lab 12/31/12 1010  NA 140  K 4.2  CL 106  CO2 29  BUN 19  CREATININE 1.09  CALCIUM 8.8  PROT 5.8*  BILITOT 0.3  ALKPHOS 57  ALT 14  AST 21  GLUCOSE 101*   No results found for this basename: PTT   Lab Results  Component Value Date   INR 1.05 12/04/2012   Lab Results  Component Value Date   TROPONINI <0.30 12/31/2012      Radiology:  Dg Chest 2 View  12/31/2012  *RADIOLOGY REPORT*  Clinical Data: Syncope.  CHEST - 2 VIEW  Comparison: None.  Findings: Elevation of right hemidiaphragm noted.  Both lungs are clear.  No evidence of pleural effusion.  Heart size is within normal limits.  No mass or lymphadenopathy identified.  IMPRESSION: No active disease.   Original Report Authenticated By: Myles Rosenthal, M.D.    Ct Head Wo Contrast  12/31/2012  *RADIOLOGY REPORT*  Clinical Data: Loss of consciousness.  CT HEAD WITHOUT CONTRAST  Technique:  Contiguous axial images were obtained from the base of the skull through the vertex without contrast.  Comparison: None.  Findings: Mild age appropriate atrophy. No acute intracranial abnormality.  Specifically, no  hemorrhage, hydrocephalus, mass lesion, acute infarction, or significant intracranial injury.  No acute calvarial abnormality. Visualized paranasal sinuses and mastoids clear.  Orbital soft tissues unremarkable.  IMPRESSION: No acute intracranial abnormality.   Original Report Authenticated By: Charlett Nose, M.D.    EKG:  Normal sinus rhythm, normal PR interval, incomplete right bundle branch block, left anterior hemiblock.    Lesleigh Noe 12/31/2012 1:09 PM

## 2013-01-01 ENCOUNTER — Encounter (HOSPITAL_COMMUNITY): Payer: Self-pay | Admitting: Family Medicine

## 2013-01-01 DIAGNOSIS — R55 Syncope and collapse: Secondary | ICD-10-CM | POA: Diagnosis not present

## 2013-01-01 DIAGNOSIS — G8929 Other chronic pain: Secondary | ICD-10-CM | POA: Diagnosis not present

## 2013-01-01 DIAGNOSIS — I452 Bifascicular block: Secondary | ICD-10-CM | POA: Diagnosis not present

## 2013-01-01 LAB — BASIC METABOLIC PANEL
BUN: 16 mg/dL (ref 6–23)
CO2: 28 mEq/L (ref 19–32)
Calcium: 8.8 mg/dL (ref 8.4–10.5)
Creatinine, Ser: 0.86 mg/dL (ref 0.50–1.35)
GFR calc Af Amer: 90 mL/min — ABNORMAL LOW (ref >90)

## 2013-01-01 LAB — CBC
MCHC: 32.9 g/dL (ref 30.0–36.0)
MCV: 90.7 fL (ref 78.0–100.0)
Platelets: 170 10*3/uL (ref 150–400)
RDW: 12.9 % (ref 11.5–15.5)
WBC: 7.6 10*3/uL (ref 4.0–10.5)

## 2013-01-01 MED ORDER — ROPINIROLE HCL 1 MG PO TABS
1.0000 mg | ORAL_TABLET | Freq: Three times a day (TID) | ORAL | Status: DC
  Administered 2013-01-01 (×2): 1 mg via ORAL
  Filled 2013-01-01 (×4): qty 1

## 2013-01-01 MED ORDER — OXYCODONE HCL 20 MG PO TB12: 60.0000 mg | ORAL_TABLET | Freq: Two times a day (BID) | ORAL | Status: DC

## 2013-01-01 MED ORDER — ROPINIROLE HCL 1 MG PO TABS: 1.0000 mg | ORAL_TABLET | Freq: Four times a day (QID) | ORAL | Status: DC

## 2013-01-01 NOTE — Progress Notes (Signed)
Physician Daily Progress Note  Subjective: No events overnight. Eating well. No syncope, or lightheadedness, no pathology on telemetry, his legs are getting a little restless this AM. Had several BMs   Of note, these events possibly occurred surrounding straining to have a BM prior to presentation to hospital.   Objective: Vital signs in last 24 hours: Temp:  [98.1 F (36.7 C)-98.6 F (37 C)] 98.4 F (36.9 C) (01/31 0639) Pulse Rate:  [58-80] 58  (01/31 0639) Resp:  [16-18] 18  (01/30 1430) BP: (93-143)/(54-77) 93/54 mmHg (01/31 0639) SpO2:  [95 %-98 %] 95 % (01/31 0639) Weight:  [150 lb (68.04 kg)-151 lb (68.493 kg)] 151 lb (68.493 kg) (01/31 0639) Weight change:  Last BM Date: 12/31/12  CBG (last 3)   Basename 12/31/12 0936  GLUCAP 96    Intake/Output from previous day:  Intake/Output Summary (Last 24 hours) at 01/01/13 0932 Last data filed at 01/01/13 0837  Gross per 24 hour  Intake    240 ml  Output    650 ml  Net   -410 ml   01/30 0701 - 01/31 0700 In: 240 [P.O.:240] Out: 150 [Urine:150]  Physical Exam General appearance: WM in NAD  Eyes: no scleral icterus Throat: oropharynx moist without erythema Resp:CTAB, no wheezes or rales  Cardio: RRR, no MRG GI: soft, non-tender; bowel sounds normal; no masses,  no organomegaly Extremities: no clubbing, cyanosis or edema   Lab Results:  [facility] 01/01/13 0610 12/31/12 1010  NA 140 140  K 4.0 4.2  CL 107 106  CO2 28 29  GLUCOSE 89 101*  BUN 16 19  CREATININE 0.86 1.09  CALCIUM 8.8 8.8  MG -- --  PHOS -- --     Basename 12/31/12 1010  AST 21  ALT 14  ALKPHOS 57  BILITOT 0.3  PROT 5.8*  ALBUMIN 3.2*     Basename 01/01/13 0610 12/31/12 1010  WBC 7.6 6.5  NEUTROABS -- 4.3  HGB 12.9* 13.1  HCT 39.2 40.0  MCV 90.7 90.1  PLT 170 161    Lab Results  Component Value Date   INR 1.05 12/04/2012     Basename 12/31/12 1010  CKTOTAL --  CKMB --  CKMBINDEX --  TROPONINI <0.30     Basename  12/31/12 1508  TSH 1.468  T4TOTAL --  T3FREE --  THYROIDAB --    No results found for this basename: VITAMINB12:2,FOLATE:2,FERRITIN:2,TIBC:2,IRON:2,RETICCTPCT:2 in the last 72 hours  Micro Results: No results found for this or any previous visit (from the past 240 hour(s)).  Studies/Results: Dg Chest 2 View  12/31/2012  *RADIOLOGY REPORT*  Clinical Data: Syncope.  CHEST - 2 VIEW  Comparison: None.  Findings: Elevation of right hemidiaphragm noted.  Both lungs are clear.  No evidence of pleural effusion.  Heart size is within normal limits.  No mass or lymphadenopathy identified.  IMPRESSION: No active disease.   Original Report Authenticated By: Myles Rosenthal, M.D.    Ct Head Wo Contrast  12/31/2012  *RADIOLOGY REPORT*  Clinical Data: Loss of consciousness.  CT HEAD WITHOUT CONTRAST  Technique:  Contiguous axial images were obtained from the base of the skull through the vertex without contrast.  Comparison: None.  Findings: Mild age appropriate atrophy. No acute intracranial abnormality.  Specifically, no hemorrhage, hydrocephalus, mass lesion, acute infarction, or significant intracranial injury.  No acute calvarial abnormality. Visualized paranasal sinuses and mastoids clear.  Orbital soft tissues unremarkable.  IMPRESSION: No acute intracranial abnormality.   Original Report Authenticated By:  Charlett Nose, M.D.      Medications: Scheduled:    . docusate sodium  100 mg Oral BID  . enoxaparin (LOVENOX) injection  40 mg Subcutaneous Q24H  . fluticasone  1 spray Each Nare Daily  . lactose free nutrition  237 mL Oral TID WC  . OxyCODONE  60 mg Oral Q12H  . oxyCODONE-acetaminophen  1 tablet Oral QID  . polyethylene glycol  17 g Oral Daily  . rOPINIRole  1 mg Oral TID AC & HS  . senna  1 tablet Oral BID  . sodium chloride  3 mL Intravenous Q12H  . triamcinolone cream   Topical Daily   Continuous:   Assessment/Plan:  *Syncope  - consulted cardiology and appreciate their recs.  -  repeat orthostatics on the floor.  - no need for fluids at this time given normal PO intake and normal BP and HR  - TSH and BNP normal - telemetry showed no events overnight - unlikely to be 2/2 seizures  - likely not 2/2 medications given no recent changes, but is on higher than max dose of requip for RLS, so will decrease this from 5mg  total per day to 4mg  total per day - no carotid bruits on exam  - EKG showed incomplete bifascicular block. Awaiting TTE to be performed this AM and then cardiology's decision on whether further testing for conduction abnormalities or need for pacer may be necessary. Will follow their recs.   Active Problems:  Chronic pain  - cont home meds   Medication induced constipation  - colace, senna, miralax scheduled  - sorbitol prn   Malnutrition  - boost supplements  - reg diet   RLS   - decrease requip from 1mg  5x per day to QID  FEN  - SLIV  - boost + normal diet   PPx  - lovenox  - no GI indicated   Dispo   - need to have TTE performed today. Will order PT in the interim and order OOBTC to help w/ mobilization. Following cardiology recs regarding w/u.  Full code     LOS: 1 day   Nayellie Sanseverino 01/01/2013, 9:32 AM

## 2013-01-01 NOTE — Progress Notes (Signed)
Pt provided with dc instructions and education. Pt verbalized understandnig. Pt aware that Dr. Katrinka Blazing office willcall with follow up. Pt aware and educated on change of medication. VSS. All questions answered. IV removed with tip intact. Heart monitor cleaned and returned to front. Pt waiting for son to pick up and take back to independent living. Levonne Spiller, RN

## 2013-01-01 NOTE — Progress Notes (Addendum)
Patient Name: Casey Gates Date of Encounter: 01/01/2013    SUBJECTIVE: No problems overnight. No near syncope.  TELEMETRY:  Steady heart rate all above 60 beats per minute without any pauses or tachyarrhythmias. Filed Vitals:   12/31/12 1431 12/31/12 1432 12/31/12 2306 01/01/13 0639  BP: 114/65 121/69 110/59 93/54  Pulse: 72 80 59 58  Temp:   98.1 F (36.7 C) 98.4 F (36.9 C)  TempSrc:   Oral Oral  Resp:      Height:      Weight:    68.493 kg (151 lb)  SpO2:   97% 95%    Intake/Output Summary (Last 24 hours) at 01/01/13 1315 Last data filed at 01/01/13 0837  Gross per 24 hour  Intake    240 ml  Output    500 ml  Net   -260 ml    LABS: Basic Metabolic Panel:  Basename 01/01/13 0610 12/31/12 1010  NA 140 140  K 4.0 4.2  CL 107 106  CO2 28 29  GLUCOSE 89 101*  BUN 16 19  CREATININE 0.86 1.09  CALCIUM 8.8 8.8  MG -- --  PHOS -- --   CBC:  Basename 01/01/13 0610 12/31/12 1010  WBC 7.6 6.5  NEUTROABS -- 4.3  HGB 12.9* 13.1  HCT 39.2 40.0  MCV 90.7 90.1  PLT 170 161   Cardiac Enzymes:  Basename 12/31/12 1010  CKTOTAL --  CKMB --  CKMBINDEX --  TROPONINI <0.30    Radiology/Studies:  No new data  Physical Exam: Blood pressure 93/54, pulse 58, temperature 98.4 F (36.9 C), temperature source Oral, resp. rate 18, height 6\' 2"  (1.88 m), weight 68.493 kg (151 lb), SpO2 95.00%. Weight change:    Clear lungs  One of 6 systolic murmur  ASSESSMENT:  1. Near syncope/syncope, has not recurred  2. Bifascicular block  3. Restless leg syndrome  Plan:  1. If the patient's echocardiogram does not reveal any structural abnormalities or surprises, I feel it would be okay to discharge the patient.  2. We will arrange for the patient have an outpatient telemetry unit for 2-4 weeks to rule out the possibility of un- diagnosed high-grade AV block missed  by this brief period of monitoring.  Selinda Eon 01/01/2013, 1:15 PM

## 2013-01-01 NOTE — Evaluation (Signed)
Physical Therapy One Time Evaluation Patient Details Name: Casey Gates MRN: [id number] DOB: Jan 13, 1926 Today's Date: 01/01/2013 Time: 6578-4696 PT Time Calculation (min): 13 min  PT Assessment / Plan / Recommendation Clinical Impression  Pt admitted for syncope.  Pt reports he is currently at baseline and able to ambulate in hallway with RW without dizziness.  Pt feels comfortable with d/c back to independent living facility and eager for d/c.  No acute PT needs identified at this time.    PT Assessment  Patent does not need any further PT services    Follow Up Recommendations  No PT follow up    Does the patient have the potential to tolerate intense rehabilitation      Barriers to Discharge        Equipment Recommendations  None recommended by PT    Recommendations for Other Services     Frequency      Precautions / Restrictions Precautions Precautions: None   Pertinent Vitals/Pain HR 89 bpm during ambulation, pt denies dizziness/syncope with mobility      Mobility  Bed Mobility Bed Mobility: Supine to Sit Supine to Sit: 6: Modified independent (Device/Increase time) Transfers Transfers: Sit to Stand;Stand to Sit Sit to Stand: 6: Modified independent (Device/Increase time) Stand to Sit: 6: Modified independent (Device/Increase time) Ambulation/Gait Ambulation/Gait Assistance: 5: Supervision Ambulation Distance (Feet): 240 Feet Assistive device: Rolling walker Ambulation/Gait Assistance Details: pt reports no difficulty with ambulation other than "stiff" back from laying in bed, reports same as prior to admission, HR 89 bpm during ambulation, pt denies dizziness/lightheadedness Gait Pattern: Step-through pattern;Trunk flexed (due scoliosis and kyphotic posture)    Shoulder Instructions     Exercises     PT Diagnosis:    PT Problem List:   PT Treatment Interventions:     PT Goals    Visit Information  Last PT Received On: 01/01/13 Assistance Needed:  +1    Subjective Data  Subjective: I haven't blacked out since I was admitted.   Prior Functioning  Home Living Lives With: Alone Type of Home: Independent living facility Home Adaptive Equipment: Dan Humphreys - four wheeled Prior Function Level of Independence: Independent with assistive device(s) Comments: Pt reports ambulating with rollator to meal hall Communication Communication: No difficulties    Cognition  Overall Cognitive Status: Appears within functional limits for tasks assessed/performed Arousal/Alertness: Awake/alert Orientation Level: Appears intact for tasks assessed Behavior During Session: [facility] for tasks performed    Extremity/Trunk Assessment Right Upper Extremity Assessment RUE ROM/Strength/Tone: [facility] for tasks assessed Left Upper Extremity Assessment LUE ROM/Strength/Tone: [facility] for tasks assessed Right Lower Extremity Assessment RLE ROM/Strength/Tone: [facility] for tasks assessed Left Lower Extremity Assessment LLE ROM/Strength/Tone: [facility] for tasks assessed Trunk Assessment Trunk Assessment: Kyphotic;Other exceptions Trunk Exceptions: thoracic scoliosis   Balance    End of Session PT - End of Session Activity Tolerance: Patient tolerated treatment well Patient left: in chair;with call bell/phone within reach  GP     [facility] E 01/01/2013, 4:18 PM  Zenovia Jarred, PT, DPT 01/01/2013 Pager: [phone]

## 2013-01-01 NOTE — Progress Notes (Signed)
  Echocardiogram 2D Echocardiogram has been performed.  Cathie Beams 01/01/2013, 1:53 PM

## 2013-01-01 NOTE — Discharge Summary (Signed)
Physician Discharge Summary    Casey Gates  MR#: [id number]  DOB:11/30/1926  Date of Admission: 12/31/2012 Date of Discharge: 01/01/2013  Attending Physician:Rupal Childress, Lorin Picket  Patient's AOZ:[id number]  Consults:Treatment Team:  Lesleigh Noe, MD  Discharge Diagnoses: Principal Problem:  *Syncope Active Problems:  Chronic pain  Bifascicular bundle branch block   Discharge Medications:   Medication List     As of 01/01/2013  4:14 PM    STOP taking these medications         clobetasol cream 0.05 %   Commonly known as: TEMOVATE      TAKE these medications         fluticasone 50 MCG/ACT nasal spray   Commonly known as: FLONASE   Place 2 sprays into the nose daily.      oxyCODONE 20 MG 12 hr tablet   Commonly known as: OXYCONTIN   Take 3 tablets (60 mg total) by mouth every 12 (twelve) hours. For pain      oxyCODONE-acetaminophen 10-325 MG per tablet   Commonly known as: PERCOCET   Take 1 tablet by mouth 4 (four) times daily.      polyethylene glycol packet   Commonly known as: MIRALAX / GLYCOLAX   Take 17 g by mouth daily.      rOPINIRole 1 MG tablet   Commonly known as: REQUIP   Take 1 tablet (1 mg total) by mouth 4 (four) times daily.        Hospital Procedures: Dg Chest 2 View  12/31/2012  *RADIOLOGY REPORT*  Clinical Data: Syncope.  CHEST - 2 VIEW  Comparison: None.  Findings: Elevation of right hemidiaphragm noted.  Both lungs are clear.  No evidence of pleural effusion.  Heart size is within normal limits.  No mass or lymphadenopathy identified.  IMPRESSION: No active disease.   Original Report Authenticated By: Myles Rosenthal, M.D.    Ct Head Wo Contrast  12/31/2012  *RADIOLOGY REPORT*  Clinical Data: Loss of consciousness.  CT HEAD WITHOUT CONTRAST  Technique:  Contiguous axial images were obtained from the base of the skull through the vertex without contrast.  Comparison: None.  Findings: Mild age appropriate atrophy. No acute intracranial  abnormality.  Specifically, no hemorrhage, hydrocephalus, mass lesion, acute infarction, or significant intracranial injury.  No acute calvarial abnormality. Visualized paranasal sinuses and mastoids clear.  Orbital soft tissues unremarkable.  IMPRESSION: No acute intracranial abnormality.   Original Report Authenticated By: Charlett Nose, M.D.    Dg Knee Complete 4 Views Left  12/04/2012  *RADIOLOGY REPORT*  Clinical Data: History of posterior knee pain and redness for several days.  LEFT KNEE - COMPLETE 4+ VIEW  Comparison: None.  Findings: There is no evidence of joint effusion.  Nonaneurysmal arterial calcifications are present.  There is narrowing of the medial joint space.  No fracture or bony destruction is seen.  No erosive change or chondrocalcinosis is evident.  IMPRESSION: Nonaneurysmal arterial calcifications.  Narrowing of medial joint space.   Original Report Authenticated By: Onalee Hua Call     History of Present Illness: Pt presented w/ new onset of episodes where he experienced a black out sensation that only lasted seconds. Happened x 3 prior to admission. No postictal state or preceding aura.   Hospital Course: Syncope/AMS - EKG showed incomplete bifascicular block. Normal orthostatics. Normal labwork (including TSH, BNP, CBC, CMP). Normal exam in house. Tele x 24 hours showed no arrhythmias. No events while being monitored. [facility] cardiology was consulted. They ordered a TTE  that showed EF of 65%, no wall motion abnormalities, and aortic valve sclerosis w/o stenosis; they would like to follow him up as an outpatient to order an ambulatory telemetry for 2-4 weeks to ensure that we did not miss a high degree AV block that was not seen during his brief hospitalization. In addition, to ensure this was not medication induced, we will attempt to decrease his requip as symptoms allow. He was instructed to decrease from 1mg  5x per day to 1 tab QID.  He will need to call cardiology office on Monday to  schedule this appt w/ Dr Katrinka Blazing. I will see him in my office in the following 1-2 weeks as well. 24 hours prior to discharge, he was w/o syncope, eating well, working w/ PT, having BMs, and w/o complaint. He is stable for d/c back to his ILF. Continue HHPT.   RLS - as explained above, decrease his requip as symptoms allow. He was instructed to decrease from 1mg  5x per day to 1 tab QID.  Day of Discharge Exam BP 105/59  Pulse 76  Temp 98.3 F (36.8 C) (Oral)  Resp 18  Ht 6\' 2"  (1.88 m)  Wt 151 lb (68.493 kg)  BMI 19.39 kg/m2  SpO2 95%  Physical Exam: General appearance: WM in NAD  Eyes: no scleral icterus  Throat: oropharynx moist without erythema  Resp:CTAB, no wheezes or rales  Cardio: RRR, no MRG  GI: soft, non-tender; bowel sounds normal; no masses, no organomegaly  Extremities: no clubbing, cyanosis or edema   Discharge Labs:  [facility] 01/01/13 0610 12/31/12 1010  NA 140 140  K 4.0 4.2  CL 107 106  CO2 28 29  GLUCOSE 89 101*  BUN 16 19  CREATININE 0.86 1.09  CALCIUM 8.8 8.8  MG -- --  PHOS -- --    Basename 12/31/12 1010  AST 21  ALT 14  ALKPHOS 57  BILITOT 0.3  PROT 5.8*  ALBUMIN 3.2*    Basename 01/01/13 0610 12/31/12 1010  WBC 7.6 6.5  NEUTROABS -- 4.3  HGB 12.9* 13.1  HCT 39.2 40.0  MCV 90.7 90.1  PLT 170 161   Lab Results  Component Value Date   INR 1.05 12/04/2012    Basename 12/31/12 1010  CKTOTAL --  CKMB --  CKMBINDEX --  TROPONINI <0.30    Basename 12/31/12 1508  TSH 1.468  T4TOTAL --  T3FREE --  THYROIDAB --   No results found for this basename: VITAMINB12:2,FOLATE:2,FERRITIN:2,TIBC:2,IRON:2,RETICCTPCT:2 in the last 72 hours  Discharge instructions:     Discharge Orders    Future Appointments: Provider: Department: Dept Phone: Center:   01/08/2013 9:00 AM Timoteo Gaul, PT [facility] [phone] [facility]     [facility]   Disposition:  home w/ HHPT  Follow-up Appts: Follow-up with Dr. Link Snuffer at [facility] in 1-2 weeks.  Our office will contact you Monday.  Follow up w/ [facility]. Please call their office for an appointment on Monday at [phone]. Your physician in the hospital was Dr Verdis Prime.  Condition on Discharge: stable   Tests Needing Follow-up: none  Time spent in discharge (includes decision making & examination of pt): 30 minutes    Signed: Naiya Corral 01/01/2013, 4:14 PM

## 2013-01-01 NOTE — Care Management Note (Signed)
    Page 1 of 1   01/01/2013     4:29:33 PM   CARE MANAGEMENT NOTE 01/01/2013  Patient:  Casey Gates, Casey Gates   Account Number:  [id number]  Date Initiated:  01/01/2013  Documentation initiated by:  GRAVES-BIGELOW,Dwane Andres  Subjective/Objective Assessment:   Pt admitted with syncope.     Action/Plan:   Pt is active with [facility] for RN and PT. MD please write resumption orders for  services. Thanks   Anticipated DC Date:  01/01/2013   Anticipated DC Plan:  HOME W HOME HEALTH SERVICES      DC Planning Services  CM consult      [facility] Choice  Resumption Of Svcs/PTA Provider   Choice offered to / List presented to:  C-1 Patient        HH arranged  HH-1 RN  HH-10 DISEASE MANAGEMENT  HH-2 PT      Status of service:  Completed, signed off Medicare Important Message given?   (If response is "NO", the following Medicare IM given date fields will be blank) Date Medicare IM given:   Date Additional Medicare IM given:    Discharge Disposition:  HOME W HOME HEALTH SERVICES  Per UR Regulation:  Reviewed for med. necessity/level of care/duration of stay  If discussed at Long Length of Stay Meetings, dates discussed:    Comments:  01-01-13 resumption orders received. Genevieve Norlander is aware that pt is in hospital. Notified liaison for resumption orders.

## 2013-01-02 NOTE — ED Provider Notes (Signed)
I saw and evaluated the patient, reviewed the resident's note and I agree with the findings and plan.  Antonius Hartlage T Kawanna Christley, MD 01/02/13 1103 

## 2013-01-05 DIAGNOSIS — H5440 Blindness, one eye, unspecified eye: Secondary | ICD-10-CM | POA: Diagnosis not present

## 2013-01-05 DIAGNOSIS — L02419 Cutaneous abscess of limb, unspecified: Secondary | ICD-10-CM | POA: Diagnosis not present

## 2013-01-05 DIAGNOSIS — G8929 Other chronic pain: Secondary | ICD-10-CM | POA: Diagnosis not present

## 2013-01-05 DIAGNOSIS — M545 Low back pain: Secondary | ICD-10-CM | POA: Diagnosis not present

## 2013-01-05 DIAGNOSIS — M4120 Other idiopathic scoliosis, site unspecified: Secondary | ICD-10-CM | POA: Diagnosis not present

## 2013-01-06 DIAGNOSIS — M810 Age-related osteoporosis without current pathological fracture: Secondary | ICD-10-CM | POA: Diagnosis not present

## 2013-01-06 DIAGNOSIS — M949 Disorder of cartilage, unspecified: Secondary | ICD-10-CM | POA: Diagnosis not present

## 2013-01-07 DIAGNOSIS — I452 Bifascicular block: Secondary | ICD-10-CM | POA: Diagnosis not present

## 2013-01-07 DIAGNOSIS — R55 Syncope and collapse: Secondary | ICD-10-CM | POA: Diagnosis not present

## 2013-01-08 ENCOUNTER — Ambulatory Visit: Payer: Medicare Other | Admitting: Physical Therapy

## 2013-01-08 DIAGNOSIS — M545 Low back pain: Secondary | ICD-10-CM | POA: Diagnosis not present

## 2013-01-08 DIAGNOSIS — M4120 Other idiopathic scoliosis, site unspecified: Secondary | ICD-10-CM | POA: Diagnosis not present

## 2013-01-08 DIAGNOSIS — H5440 Blindness, one eye, unspecified eye: Secondary | ICD-10-CM | POA: Diagnosis not present

## 2013-01-08 DIAGNOSIS — G8929 Other chronic pain: Secondary | ICD-10-CM | POA: Diagnosis not present

## 2013-01-08 DIAGNOSIS — L03119 Cellulitis of unspecified part of limb: Secondary | ICD-10-CM | POA: Diagnosis not present

## 2013-01-11 DIAGNOSIS — H5440 Blindness, one eye, unspecified eye: Secondary | ICD-10-CM | POA: Diagnosis not present

## 2013-01-11 DIAGNOSIS — G8929 Other chronic pain: Secondary | ICD-10-CM | POA: Diagnosis not present

## 2013-01-11 DIAGNOSIS — M4120 Other idiopathic scoliosis, site unspecified: Secondary | ICD-10-CM | POA: Diagnosis not present

## 2013-01-11 DIAGNOSIS — M545 Low back pain: Secondary | ICD-10-CM | POA: Diagnosis not present

## 2013-01-11 DIAGNOSIS — L02419 Cutaneous abscess of limb, unspecified: Secondary | ICD-10-CM | POA: Diagnosis not present

## 2013-01-12 DIAGNOSIS — H5440 Blindness, one eye, unspecified eye: Secondary | ICD-10-CM | POA: Diagnosis not present

## 2013-01-12 DIAGNOSIS — M545 Low back pain: Secondary | ICD-10-CM | POA: Diagnosis not present

## 2013-01-12 DIAGNOSIS — M4120 Other idiopathic scoliosis, site unspecified: Secondary | ICD-10-CM | POA: Diagnosis not present

## 2013-01-12 DIAGNOSIS — G8929 Other chronic pain: Secondary | ICD-10-CM | POA: Diagnosis not present

## 2013-01-12 DIAGNOSIS — L03119 Cellulitis of unspecified part of limb: Secondary | ICD-10-CM | POA: Diagnosis not present

## 2013-01-15 DIAGNOSIS — H5440 Blindness, one eye, unspecified eye: Secondary | ICD-10-CM | POA: Diagnosis not present

## 2013-01-15 DIAGNOSIS — M4120 Other idiopathic scoliosis, site unspecified: Secondary | ICD-10-CM | POA: Diagnosis not present

## 2013-01-15 DIAGNOSIS — M545 Low back pain: Secondary | ICD-10-CM | POA: Diagnosis not present

## 2013-01-15 DIAGNOSIS — G8929 Other chronic pain: Secondary | ICD-10-CM | POA: Diagnosis not present

## 2013-01-15 DIAGNOSIS — L03119 Cellulitis of unspecified part of limb: Secondary | ICD-10-CM | POA: Diagnosis not present

## 2013-01-19 ENCOUNTER — Emergency Department (HOSPITAL_COMMUNITY): Payer: Medicare Other

## 2013-01-19 ENCOUNTER — Emergency Department (HOSPITAL_COMMUNITY)
Admission: EM | Admit: 2013-01-19 | Discharge: 2013-01-19 | Disposition: A | Discharge status: Home / Self Care | Payer: Medicare Other | Attending: Emergency Medicine | Admitting: Emergency Medicine

## 2013-01-19 DIAGNOSIS — J45909 Unspecified asthma, uncomplicated: Secondary | ICD-10-CM | POA: Diagnosis not present

## 2013-01-19 DIAGNOSIS — Z859 Personal history of malignant neoplasm, unspecified: Secondary | ICD-10-CM | POA: Diagnosis not present

## 2013-01-19 DIAGNOSIS — N189 Chronic kidney disease, unspecified: Secondary | ICD-10-CM | POA: Insufficient documentation

## 2013-01-19 DIAGNOSIS — H5440 Blindness, one eye, unspecified eye: Secondary | ICD-10-CM | POA: Insufficient documentation

## 2013-01-19 DIAGNOSIS — T18108A Unspecified foreign body in esophagus causing other injury, initial encounter: Secondary | ICD-10-CM | POA: Insufficient documentation

## 2013-01-19 DIAGNOSIS — R1310 Dysphagia, unspecified: Secondary | ICD-10-CM | POA: Insufficient documentation

## 2013-01-19 DIAGNOSIS — R6889 Other general symptoms and signs: Secondary | ICD-10-CM | POA: Diagnosis not present

## 2013-01-19 DIAGNOSIS — Y929 Unspecified place or not applicable: Secondary | ICD-10-CM | POA: Insufficient documentation

## 2013-01-19 DIAGNOSIS — Z79899 Other long term (current) drug therapy: Secondary | ICD-10-CM | POA: Diagnosis not present

## 2013-01-19 DIAGNOSIS — IMO0002 Reserved for concepts with insufficient information to code with codable children: Secondary | ICD-10-CM | POA: Diagnosis not present

## 2013-01-19 DIAGNOSIS — Z8669 Personal history of other diseases of the nervous system and sense organs: Secondary | ICD-10-CM | POA: Insufficient documentation

## 2013-01-19 DIAGNOSIS — Y9389 Activity, other specified: Secondary | ICD-10-CM | POA: Insufficient documentation

## 2013-01-19 DIAGNOSIS — K222 Esophageal obstruction: Secondary | ICD-10-CM | POA: Diagnosis not present

## 2013-01-19 NOTE — ED Notes (Signed)
No distress noted.  Continues to deny pain.  Resp symmetrical and unlabored.

## 2013-01-19 NOTE — ED Notes (Signed)
Waiting on MD at the present.

## 2013-01-19 NOTE — ED Provider Notes (Addendum)
History     CSN: [id number]  Arrival date & time 01/19/13  0902   First MD Initiated Contact with Patient 01/19/13 1045      Chief Complaint  Patient presents with  . Abdominal Pain    (Consider location/radiation/quality/duration/timing/severity/associated sxs/prior treatment) HPI Comments: Patient was eating breakfast this morning and he thinks a piece of bacon got stuck in his throat. He tried to cough it up but it wouldn't come out. Then he had pain in the Center of his chest however he down into his abdomen. This all lasted for an hour. He feels better now. He's had no prior similar episode. About a month ago he had syncopal episodes and is wearing a cardiac monitor, blood the symptoms are nothing like that.  Patient is a 79 y.o. male presenting with abdominal pain.  Abdominal Pain Pain location: Substernal and epigastric. Pain quality comment:  A feeling of burning in the chest due to apparent hangup of food. Pain radiates to:  Does not radiate Pain severity:  Moderate Onset quality:  Sudden Duration:  1 hour Progression:  Resolved Chronicity:  New Context comment:  No prior similar episode. Relieved by:  Nothing Worsened by:  Nothing tried Associated symptoms: no chills and no fever   Associated symptoms comment:  Inability to swallow.   Past Medical History  Diagnosis Date  . Blind left eye   . Restless leg   . Asthma     AS A CHILD  . Chronic kidney disease   . Cancer     PROSTATE CANCER    Past Surgical History  Procedure Laterality Date  . Prostatectomy    . Rigth nephrectomy  2006  . Right cataract extraction  2013  . Tonsillectomy      Family History  Problem Relation Age of Onset  . Bone cancer Mother     History  Substance Use Topics  . Smoking status: Never Smoker   . Smokeless tobacco: Never Used  . Alcohol Use: No     Comment: QUIT ALCOHOL AT AGE 73      Review of Systems  Constitutional: Negative for fever and chills.  HENT:  Negative.   Eyes: Negative.   Respiratory: Negative.   Cardiovascular: Negative.   Gastrointestinal: Positive for abdominal pain.       Inability to swallow.  Genitourinary: Negative.   Musculoskeletal: Negative.   Skin: Negative.   Neurological: Negative.   Psychiatric/Behavioral: Negative.     Allergies  Peanuts  Home Medications   Current Outpatient Rx  Name  Route  Sig  Dispense  Refill  . calcium carbonate (TUMS) 500 MG chewable tablet   Oral   Chew 1 tablet by mouth daily as needed for heartburn.         . fluticasone (FLONASE) 50 MCG/ACT nasal spray   Nasal   Place 2 sprays into the nose daily.         Marland Kitchen oxyCODONE (OXYCONTIN) 20 MG 12 hr tablet   Oral   Take 40-60 mg by mouth See admin instructions. Pt takes either  2 tabs bid 2 tabs tid 3 tabs bid (this is how its (prescribed)         . oxyCODONE-acetaminophen (PERCOCET) 10-325 MG per tablet   Oral   Take 1 tablet by mouth 4 (four) times daily.          . polyethylene glycol (MIRALAX / GLYCOLAX) packet   Oral   Take 17 g by mouth daily.         Marland Kitchen  rOPINIRole (REQUIP) 1 MG tablet   Oral   Take 1 tablet (1 mg total) by mouth 4 (four) times daily.         Marland Kitchen triamcinolone cream (KENALOG) 0.1 %   Topical   Apply 1 application topically 2 (two) times daily.           BP 126/61  Pulse 72  Temp(Src) 98 F (36.7 C) (Oral)  Resp 16  SpO2 98%  Physical Exam  Nursing note and vitals reviewed. Constitutional: He is oriented to person, place, and time. He appears well-developed and well-nourished. No distress.  HENT:  Head: Normocephalic and atraumatic.  Right Ear: External ear normal.  Left Ear: External ear normal.  Mouth/Throat: Oropharynx is clear and moist.  Eyes: Conjunctivae and EOM are normal. Pupils are equal, round, and reactive to light.  Neck: Normal range of motion. Neck supple.  Cardiovascular: Normal rate, regular rhythm and normal heart sounds.   Pulmonary/Chest: Effort  normal and breath sounds normal.  Abdominal: Soft. Bowel sounds are normal. He exhibits no mass. There is tenderness. There is no rebound and no guarding.  Musculoskeletal: Normal range of motion. He exhibits no edema and no tenderness.  Neurological: He is alert and oriented to person, place, and time.  No sensory or motor deficit.  Skin: Skin is warm and dry.  Psychiatric: He has a normal mood and affect. His behavior is normal.    ED Course  Procedures (including critical care time)  11:42 AM Pt seen --> physical exam performed.  Barium swallow ordered.   Dg Esophagus  01/19/2013  *RADIOLOGY REPORT*  Clinical Data: Fluid became.  In the esophagus this morning.  ESOPHOGRAM/BARIUM SWALLOW  Technique:  Single contrast examination was performed using thin barium.  Fluoroscopy time:  1.20 minutes.  Comparison:  None.  Findings:  There appear to be retained food in the distal esophagus.  No mass or ulceration is identified.  A 13 mm barium tablet became transiently lodged in the distal esophagus.  The tablet would not pass with drinking water.  The tablet passed after the patient drank barium.  IMPRESSION: Distal esophageal stricture. A 13 mm barium tablet became transiently lodged in the distal esophagus but was pushed with barium.  There appears to be retained food material within the esophagus.   Original Report Authenticated By: Holley Dexter, M.D.    1:58 PM Esophogram showed food in distal esophagus and an esophageal stricture.  Case discussed with Dr. Evette Cristal, who will see pt, and who plans to take him to the Endoscopy Suite to remove the foreign material in his esophagus.  1. Esophageal foreign body           Carleene Cooper III, MD 01/19/13 1400    Carleene Cooper III, MD 01/19/13 [phone]

## 2013-01-19 NOTE — ED Notes (Signed)
Pt being transported to x-ray at this time.  

## 2013-01-19 NOTE — ED Notes (Signed)
Pt undressed, in gown, on monitor, continuous pulse oximetry and blood pressure cuff 

## 2013-01-19 NOTE — Consult Note (Signed)
Subjective:   HPI  The patient is an 79 year old male who was eating breakfast this morning and felt like he started to have a burning sensation in the lower chest and food stuck. He came to the emergency room where he had a barium swallow done which raised the suspicion of a stricture in the distal esophagus and some retained food in the distal esophagus. I was called from the emergency room for suspected foreign body in the esophagus. I elected to bring him up to the endoscopy unit but after arriving in the endoscopy unit he started to feel better. He was given some water to drink and everything went down fine without any difficulty. He states that recently he has been having some burning in the lower chest and he will take a Tums which will help temporarily. He denies that he has been having dysphagia for any period of time other than the episode today.  Review of Systems   Past Medical History  Diagnosis Date  . Blind left eye   . Restless leg   . Asthma     AS A CHILD  . Chronic kidney disease   . Cancer     PROSTATE CANCER   Past Surgical History  Procedure Laterality Date  . Prostatectomy    . Rigth nephrectomy  2006  . Right cataract extraction  2013  . Tonsillectomy     History   Social History  . Marital Status: Widowed    Spouse Name: N/A    Number of Children: N/A  . Years of Education: N/A   Occupational History  . Not on file.   Social History Main Topics  . Smoking status: Never Smoker   . Smokeless tobacco: Never Used  . Alcohol Use: No     Comment: QUIT ALCOHOL AT AGE 12  . Drug Use: No  . Sexually Active: No   Other Topics Concern  . Not on file   Social History Narrative  . No narrative on file   family history includes Bone cancer in his mother. No current facility-administered medications for this encounter. Current outpatient prescriptions:calcium carbonate (TUMS) 500 MG chewable tablet, Chew 1 tablet by mouth daily as needed for heartburn.,  Disp: , Rfl: ;  fluticasone (FLONASE) 50 MCG/ACT nasal spray, Place 2 sprays into the nose daily., Disp: , Rfl: ;  oxyCODONE (OXYCONTIN) 20 MG 12 hr tablet, Take 40-60 mg by mouth See admin instructions. Pt takes either  2 tabs bid 2 tabs tid 3 tabs bid (this is how its (prescribed), Disp: , Rfl:  oxyCODONE-acetaminophen (PERCOCET) 10-325 MG per tablet, Take 1 tablet by mouth 4 (four) times daily. , Disp: , Rfl: ;  polyethylene glycol (MIRALAX / GLYCOLAX) packet, Take 17 g by mouth daily., Disp: , Rfl: ;  rOPINIRole (REQUIP) 1 MG tablet, Take 1 tablet (1 mg total) by mouth 4 (four) times daily., Disp: , Rfl: ;  triamcinolone cream (KENALOG) 0.1 %, Apply 1 application topically 2 (two) times daily., Disp: , Rfl:  Allergies  Allergen Reactions  . Peanuts (Peanut Oil) Other (See Comments)    Shortness of breath---all nuts, but can have peanuts, cashews, and almonds     Objective:     BP 139/64  Pulse 75  Temp(Src) 98 F (36.7 C) (Oral)  Resp 18  SpO2 96%  Alert and oriented and in no acute distress  Heart regular rhythm no murmurs  Lungs clear  Abdomen is soft and nontender  Laboratory No components found  with this basename: d1      Assessment:     Probable distal esophagitis with suspected distal esophageal stricture. It appears clinically that the suspected foreign body has passed since he is able to drink water and feels normal now.      Plan:     We talked about further evaluation. At this time I think with all the barium that he drank recently it will obscure potential significant findings. I have therefore recommended we place him on a PPI and see him in the office in a week and reassess the situation and we can do outpatient EGD with dilatation if appropriate. I have written him a prescription for Aciphex 20 mg daily. I have asked him to call my office and set up an appointment to see me next week.

## 2013-01-19 NOTE — ED Notes (Signed)
Per [facility] pt was eating breakfast when he stated that he felt like something got "caught in his throat" and he began to have epigastric pain with a burning sensation.  No distress noted.  Resp symmetrical and unlabored.  Skin warm and dry.

## 2013-01-19 NOTE — ED Notes (Signed)
Pt transported to Endoscopy.  No distress noted.  Resp symmetrical and unlabored.  Skin warm and dry.  No nausea or vomiting noted.

## 2013-01-21 DIAGNOSIS — H5440 Blindness, one eye, unspecified eye: Secondary | ICD-10-CM | POA: Diagnosis not present

## 2013-01-22 DIAGNOSIS — H5440 Blindness, one eye, unspecified eye: Secondary | ICD-10-CM | POA: Diagnosis not present

## 2013-01-22 DIAGNOSIS — M4120 Other idiopathic scoliosis, site unspecified: Secondary | ICD-10-CM | POA: Diagnosis not present

## 2013-01-22 DIAGNOSIS — L03119 Cellulitis of unspecified part of limb: Secondary | ICD-10-CM | POA: Diagnosis not present

## 2013-01-22 DIAGNOSIS — G8929 Other chronic pain: Secondary | ICD-10-CM | POA: Diagnosis not present

## 2013-01-22 DIAGNOSIS — M545 Low back pain: Secondary | ICD-10-CM | POA: Diagnosis not present

## 2013-01-25 DIAGNOSIS — R1310 Dysphagia, unspecified: Secondary | ICD-10-CM | POA: Diagnosis not present

## 2013-01-25 DIAGNOSIS — I872 Venous insufficiency (chronic) (peripheral): Secondary | ICD-10-CM | POA: Diagnosis not present

## 2013-01-25 DIAGNOSIS — R5381 Other malaise: Secondary | ICD-10-CM | POA: Diagnosis not present

## 2013-01-25 DIAGNOSIS — R55 Syncope and collapse: Secondary | ICD-10-CM | POA: Diagnosis not present

## 2013-01-26 DIAGNOSIS — L02419 Cutaneous abscess of limb, unspecified: Secondary | ICD-10-CM | POA: Diagnosis not present

## 2013-01-26 DIAGNOSIS — M545 Low back pain: Secondary | ICD-10-CM | POA: Diagnosis not present

## 2013-01-26 DIAGNOSIS — G8929 Other chronic pain: Secondary | ICD-10-CM | POA: Diagnosis not present

## 2013-01-26 DIAGNOSIS — H5440 Blindness, one eye, unspecified eye: Secondary | ICD-10-CM | POA: Diagnosis not present

## 2013-01-26 DIAGNOSIS — M4120 Other idiopathic scoliosis, site unspecified: Secondary | ICD-10-CM | POA: Diagnosis not present

## 2013-01-27 DIAGNOSIS — L02419 Cutaneous abscess of limb, unspecified: Secondary | ICD-10-CM | POA: Diagnosis not present

## 2013-01-28 DIAGNOSIS — M545 Low back pain: Secondary | ICD-10-CM | POA: Diagnosis not present

## 2013-01-28 DIAGNOSIS — H5440 Blindness, one eye, unspecified eye: Secondary | ICD-10-CM | POA: Diagnosis not present

## 2013-01-28 DIAGNOSIS — G8929 Other chronic pain: Secondary | ICD-10-CM | POA: Diagnosis not present

## 2013-01-28 DIAGNOSIS — M4120 Other idiopathic scoliosis, site unspecified: Secondary | ICD-10-CM | POA: Diagnosis not present

## 2013-01-28 DIAGNOSIS — L02419 Cutaneous abscess of limb, unspecified: Secondary | ICD-10-CM | POA: Diagnosis not present

## 2013-01-30 DIAGNOSIS — M810 Age-related osteoporosis without current pathological fracture: Secondary | ICD-10-CM | POA: Diagnosis not present

## 2013-01-30 DIAGNOSIS — R55 Syncope and collapse: Secondary | ICD-10-CM | POA: Diagnosis not present

## 2013-02-02 DIAGNOSIS — G8929 Other chronic pain: Secondary | ICD-10-CM | POA: Diagnosis not present

## 2013-02-02 DIAGNOSIS — M545 Low back pain: Secondary | ICD-10-CM | POA: Diagnosis not present

## 2013-02-03 DIAGNOSIS — R933 Abnormal findings on diagnostic imaging of other parts of digestive tract: Secondary | ICD-10-CM | POA: Diagnosis not present

## 2013-02-03 DIAGNOSIS — Z538 Procedure and treatment not carried out for other reasons: Secondary | ICD-10-CM | POA: Diagnosis not present

## 2013-02-04 DIAGNOSIS — H5440 Blindness, one eye, unspecified eye: Secondary | ICD-10-CM | POA: Diagnosis not present

## 2013-02-04 DIAGNOSIS — M810 Age-related osteoporosis without current pathological fracture: Secondary | ICD-10-CM | POA: Diagnosis not present

## 2013-02-04 DIAGNOSIS — G8929 Other chronic pain: Secondary | ICD-10-CM | POA: Diagnosis not present

## 2013-02-04 DIAGNOSIS — M545 Low back pain: Secondary | ICD-10-CM | POA: Diagnosis not present

## 2013-02-04 DIAGNOSIS — L03119 Cellulitis of unspecified part of limb: Secondary | ICD-10-CM | POA: Diagnosis not present

## 2013-02-04 DIAGNOSIS — M4120 Other idiopathic scoliosis, site unspecified: Secondary | ICD-10-CM | POA: Diagnosis not present

## 2013-02-09 DIAGNOSIS — H5440 Blindness, one eye, unspecified eye: Secondary | ICD-10-CM | POA: Diagnosis not present

## 2013-02-09 DIAGNOSIS — G8929 Other chronic pain: Secondary | ICD-10-CM | POA: Diagnosis not present

## 2013-02-09 DIAGNOSIS — M4120 Other idiopathic scoliosis, site unspecified: Secondary | ICD-10-CM | POA: Diagnosis not present

## 2013-02-09 DIAGNOSIS — M545 Low back pain: Secondary | ICD-10-CM | POA: Diagnosis not present

## 2013-02-09 DIAGNOSIS — L02419 Cutaneous abscess of limb, unspecified: Secondary | ICD-10-CM | POA: Diagnosis not present

## 2013-02-10 ENCOUNTER — Ambulatory Visit: Payer: Medicare Other | Attending: Internal Medicine | Admitting: Physical Therapy

## 2013-02-10 DIAGNOSIS — IMO0001 Reserved for inherently not codable concepts without codable children: Secondary | ICD-10-CM | POA: Diagnosis not present

## 2013-02-10 DIAGNOSIS — R262 Difficulty in walking, not elsewhere classified: Secondary | ICD-10-CM | POA: Diagnosis not present

## 2013-02-11 DIAGNOSIS — L03119 Cellulitis of unspecified part of limb: Secondary | ICD-10-CM | POA: Diagnosis not present

## 2013-02-11 DIAGNOSIS — L02419 Cutaneous abscess of limb, unspecified: Secondary | ICD-10-CM | POA: Diagnosis not present

## 2013-02-12 DIAGNOSIS — M545 Low back pain: Secondary | ICD-10-CM | POA: Diagnosis not present

## 2013-02-15 ENCOUNTER — Other Ambulatory Visit (HOSPITAL_COMMUNITY): Payer: Self-pay | Admitting: Gastroenterology

## 2013-02-15 DIAGNOSIS — K209 Esophagitis, unspecified without bleeding: Secondary | ICD-10-CM

## 2013-02-16 DIAGNOSIS — L708 Other acne: Secondary | ICD-10-CM | POA: Diagnosis not present

## 2013-02-16 DIAGNOSIS — L570 Actinic keratosis: Secondary | ICD-10-CM | POA: Diagnosis not present

## 2013-02-16 DIAGNOSIS — L259 Unspecified contact dermatitis, unspecified cause: Secondary | ICD-10-CM | POA: Diagnosis not present

## 2013-02-18 ENCOUNTER — Ambulatory Visit (HOSPITAL_COMMUNITY)
Admission: RE | Admit: 2013-02-18 | Discharge: 2013-02-18 | Disposition: A | Discharge status: Home / Self Care | Payer: Medicare Other | Source: Ambulatory Visit | Attending: Gastroenterology | Admitting: Gastroenterology

## 2013-02-18 ENCOUNTER — Other Ambulatory Visit (HOSPITAL_COMMUNITY): Payer: BC Managed Care – PPO

## 2013-02-18 DIAGNOSIS — K5900 Constipation, unspecified: Secondary | ICD-10-CM | POA: Insufficient documentation

## 2013-02-18 DIAGNOSIS — R1313 Dysphagia, pharyngeal phase: Secondary | ICD-10-CM | POA: Insufficient documentation

## 2013-02-18 DIAGNOSIS — K220 Achalasia of cardia: Secondary | ICD-10-CM | POA: Diagnosis not present

## 2013-02-18 DIAGNOSIS — R1310 Dysphagia, unspecified: Secondary | ICD-10-CM | POA: Diagnosis not present

## 2013-02-18 DIAGNOSIS — K3189 Other diseases of stomach and duodenum: Secondary | ICD-10-CM | POA: Insufficient documentation

## 2013-02-18 DIAGNOSIS — R1013 Epigastric pain: Secondary | ICD-10-CM | POA: Insufficient documentation

## 2013-02-18 DIAGNOSIS — R1319 Other dysphagia: Secondary | ICD-10-CM | POA: Diagnosis not present

## 2013-02-18 NOTE — Procedures (Signed)
Objective Swallowing Evaluation: Modified Barium Swallowing Study  Patient Details  Name: Casey Gates MRN: [id number] Date of Birth: 02/28/1926  Today's Date: 02/18/2013 Time: 1308-6578 SLP Time Calculation (min): 27 min  Past Medical History:  Past Medical History  Diagnosis Date  . Blind left eye   . Restless leg   . Asthma     AS A CHILD  . Chronic kidney disease   . Cancer     PROSTATE CANCER   Past Surgical History:  Past Surgical History  Procedure Laterality Date  . Prostatectomy    . Rigth nephrectomy  2006  . Right cataract extraction  2013  . Tonsillectomy     HPI:  Pt is an 79 year old male referred for an outpatient MBS due to a recent event where he felt that a piece of bacon was lodged in his throat. He was able to talk and breathe normally at the time though he felt pain. He went to ED, and before treatment was offered, the food moved on. An EGD was attempted, but the MD could not pass the scope. He also reports episodes of constipation due to medication and reportedly also had a food impaction at the time of his ED visit. He reports only occasional symptoms of indigestion. He also struggles with pills and tucks his chin upon a previous MD recommendation. He denies any pneumonia or noticable choking/coughing with PO. He eats a regular diet. Oral motor exam normal.      Assessment / Plan / Recommendation Clinical Impression  Dysphagia Diagnosis: Moderate cervical esophageal phase dysphagia;Mild pharyngeal phase dysphagia  Clinical impression: Pt presents with a primary cervical esopahgeal dysphagia with a mild pharyngeal dysphagia. There is the appearance of a cervical ostephyte at the level of the UES as well as a prominent/hypertensive UES (requested that radiologist performing barium swallow today would comment). With solid and liquid boluses there is incomplete passage of the bolus with backflow to the pyriform sinsues. With thin liquids in particular there is  also residual in the valleculae and pyrifrom sinuses. There is also incomplete closure of the airway during the swallow, leading to mild silent penetration/aspiration of larger sips of thin liquids during the swallow. With a chin tuck, the pt is able to draw the UES open more fully to allow better passage of the bolus. Unfortunately this does not improve airway protection and sometimes exacerbates aspiration, particularly when taking a pill with liquid. If the pt takes small single sips of thin liquids there is no significant penetration observed, even with a chin tuck.   At this time, the pt is recommended to continue his regular diet, but complete a chin tuck and double swallow with each bite/sip, avoid large sips and straws and take pills whole in puree with a chin tuck. Written and verbal instruciton provided, pt able to complete with minimal cues and carry over independently. No SLP f/u needed at this time.     Treatment Recommendation  No treatment recommended at this time    Diet Recommendation Regular;Thin liquid   Liquid Administration via: Cup;No straw Medication Administration: Whole meds with puree Supervision: Patient able to self feed Compensations: Slow rate;Small sips/bites;Multiple dry swallows after each bite/sip Postural Changes and/or Swallow Maneuvers: Chin tuck    Other  Recommendations Oral Care Recommendations: Patient independent with oral care   Follow Up Recommendations  None    Frequency and Duration        Pertinent Vitals/Pain NA    SLP Swallow Goals  General HPI: Pt is an 79 year old male referred for an outpatient MBS due to a recent event where he felt that a piece of bacon was lodged in his throat. He went to ED and before treatment was offered the food moved on. An EGD was attempted, but the MD could not pass the scope. He also reports episodes of contipation from medication and reportedly also has a food impaction at the time. He reports only  occasionaly symptoms of indigestion. He also struggles with pills and tucks his chin upon a previos MD recommendation. He denies any pneumonia or noticable choking/coughing with PO. He eats a regular diet. Oral motor exam normal.  Type of Study: Modified Barium Swallowing Study Reason for Referral: Objectively evaluate swallowing function Diet Prior to this Study: Regular;Thin liquids Temperature Spikes Noted: N/A Respiratory Status: Room air History of Recent Intubation: No Behavior/Cognition: Alert;Cooperative;Pleasant mood Oral Cavity - Dentition: Adequate natural dentition Oral Motor / Sensory Function: Within functional limits Self-Feeding Abilities: Able to feed self Patient Positioning: Upright in chair Baseline Vocal Quality: Clear Volitional Cough: Strong Volitional Swallow: Able to elicit Anatomy:  (Appearance of cervical ostephyte. MD to confirm ba sw. ) Pharyngeal Secretions: Not observed secondary MBS    Reason for Referral Objectively evaluate swallowing function   Oral Phase Oral Preparation/Oral Phase Oral Phase: WFL   Pharyngeal Phase Pharyngeal Phase Pharyngeal Phase: Impaired Pharyngeal - Thin Pharyngeal - Thin Cup: Reduced airway/laryngeal closure;Penetration/Aspiration during swallow;Pharyngeal residue - valleculae;Pharyngeal residue - pyriform sinuses Penetration/Aspiration details (thin cup): Material enters airway, remains ABOVE vocal cords then ejected out Pharyngeal - Thin Straw: Reduced airway/laryngeal closure;Penetration/Aspiration during swallow;Pharyngeal residue - valleculae;Pharyngeal residue - pyriform sinuses;Pharyngeal residue - cp segment Penetration/Aspiration details (thin straw): Material enters airway, CONTACTS cords and not ejected out Pharyngeal - Solids Pharyngeal - Puree: Pharyngeal residue - cp segment Pharyngeal - Regular: Pharyngeal residue - cp segment Pharyngeal - Pill: Reduced airway/laryngeal closure;Penetration/Aspiration during  swallow;Moderate aspiration Penetration/Aspiration details (pill): Material enters airway, passes BELOW cords without attempt by patient to eject out (silent aspiration)  Cervical Esophageal Phase    GO    Cervical Esophageal Phase Cervical Esophageal Phase: Impaired Cervical Esophageal Phase - Comment Cervical Esophageal Comment: see impression statement    Functional Assessment Tool Used: clinical judgement Functional Limitations: Swallowing Swallow Current Status (Y7829): At least 20 percent but less than 40 percent impaired, limited or restricted Swallow Goal Status [phone]): At least 1 percent but less than 20 percent impaired, limited or restricted Swallow Discharge Status [phone]): At least 1 percent but less than 20 percent impaired, limited or restricted   [facility], [place] CCC-SLP [phone]  Claudine Mouton 02/18/2013, 11:56 AM

## 2013-03-03 DIAGNOSIS — H2510 Age-related nuclear cataract, unspecified eye: Secondary | ICD-10-CM | POA: Diagnosis not present

## 2013-03-16 DIAGNOSIS — L259 Unspecified contact dermatitis, unspecified cause: Secondary | ICD-10-CM | POA: Diagnosis not present

## 2013-03-16 DIAGNOSIS — I8310 Varicose veins of unspecified lower extremity with inflammation: Secondary | ICD-10-CM | POA: Diagnosis not present

## 2013-03-25 DIAGNOSIS — R609 Edema, unspecified: Secondary | ICD-10-CM | POA: Diagnosis not present

## 2013-03-30 DIAGNOSIS — Z9181 History of falling: Secondary | ICD-10-CM | POA: Diagnosis not present

## 2013-03-30 DIAGNOSIS — L03119 Cellulitis of unspecified part of limb: Secondary | ICD-10-CM | POA: Diagnosis not present

## 2013-03-30 DIAGNOSIS — E559 Vitamin D deficiency, unspecified: Secondary | ICD-10-CM | POA: Diagnosis not present

## 2013-03-30 DIAGNOSIS — R1310 Dysphagia, unspecified: Secondary | ICD-10-CM | POA: Diagnosis not present

## 2013-03-30 DIAGNOSIS — G894 Chronic pain syndrome: Secondary | ICD-10-CM | POA: Diagnosis not present

## 2013-03-30 DIAGNOSIS — M810 Age-related osteoporosis without current pathological fracture: Secondary | ICD-10-CM | POA: Diagnosis not present

## 2013-03-31 DIAGNOSIS — R1310 Dysphagia, unspecified: Secondary | ICD-10-CM | POA: Diagnosis not present

## 2013-04-04 ENCOUNTER — Encounter (HOSPITAL_COMMUNITY): Payer: Self-pay | Admitting: *Deleted

## 2013-04-04 ENCOUNTER — Emergency Department (HOSPITAL_COMMUNITY)
Admission: EM | Admit: 2013-04-04 | Discharge: 2013-04-04 | Disposition: A | Discharge status: Home / Self Care | Payer: Medicare Other | Attending: Emergency Medicine | Admitting: Emergency Medicine

## 2013-04-04 DIAGNOSIS — Z79899 Other long term (current) drug therapy: Secondary | ICD-10-CM | POA: Insufficient documentation

## 2013-04-04 DIAGNOSIS — R609 Edema, unspecified: Secondary | ICD-10-CM | POA: Diagnosis not present

## 2013-04-04 DIAGNOSIS — Z8546 Personal history of malignant neoplasm of prostate: Secondary | ICD-10-CM | POA: Insufficient documentation

## 2013-04-04 DIAGNOSIS — Z8669 Personal history of other diseases of the nervous system and sense organs: Secondary | ICD-10-CM | POA: Insufficient documentation

## 2013-04-04 DIAGNOSIS — H5440 Blindness, one eye, unspecified eye: Secondary | ICD-10-CM | POA: Insufficient documentation

## 2013-04-04 DIAGNOSIS — M7989 Other specified soft tissue disorders: Secondary | ICD-10-CM | POA: Diagnosis not present

## 2013-04-04 DIAGNOSIS — L089 Local infection of the skin and subcutaneous tissue, unspecified: Secondary | ICD-10-CM | POA: Insufficient documentation

## 2013-04-04 DIAGNOSIS — I872 Venous insufficiency (chronic) (peripheral): Secondary | ICD-10-CM

## 2013-04-04 DIAGNOSIS — IMO0002 Reserved for concepts with insufficient information to code with codable children: Secondary | ICD-10-CM | POA: Insufficient documentation

## 2013-04-04 DIAGNOSIS — M79609 Pain in unspecified limb: Secondary | ICD-10-CM | POA: Diagnosis not present

## 2013-04-04 DIAGNOSIS — N189 Chronic kidney disease, unspecified: Secondary | ICD-10-CM | POA: Insufficient documentation

## 2013-04-04 DIAGNOSIS — J45909 Unspecified asthma, uncomplicated: Secondary | ICD-10-CM | POA: Diagnosis not present

## 2013-04-04 LAB — COMPREHENSIVE METABOLIC PANEL
ALT: 13 U/L (ref 0–53)
AST: 20 U/L (ref 0–37)
Albumin: 3.4 g/dL — ABNORMAL LOW (ref 3.5–5.2)
Alkaline Phosphatase: 63 U/L (ref 39–117)
BUN: 22 mg/dL (ref 6–23)
Chloride: 106 mEq/L (ref 96–112)
Potassium: 4.7 mEq/L (ref 3.5–5.1)
Sodium: 139 mEq/L (ref 135–145)
Total Bilirubin: 0.3 mg/dL (ref 0.3–1.2)
Total Protein: 6.2 g/dL (ref 6.0–8.3)

## 2013-04-04 LAB — CBC WITH DIFFERENTIAL/PLATELET
Basophils Absolute: 0 10*3/uL (ref 0.0–0.1)
Basophils Relative: 0 % (ref 0–1)
Eosinophils Absolute: 0.2 10*3/uL (ref 0.0–0.7)
Hemoglobin: 13.4 g/dL (ref 13.0–17.0)
MCHC: 32.8 g/dL (ref 30.0–36.0)
Monocytes Relative: 11 % (ref 3–12)
Neutro Abs: 4 10*3/uL (ref 1.7–7.7)
Neutrophils Relative %: 58 % (ref 43–77)
Platelets: 157 10*3/uL (ref 150–400)

## 2013-04-04 LAB — URINALYSIS, ROUTINE W REFLEX MICROSCOPIC
Bilirubin Urine: NEGATIVE
Glucose, UA: NEGATIVE mg/dL
Hgb urine dipstick: NEGATIVE
Ketones, ur: NEGATIVE mg/dL
Nitrite: NEGATIVE
Specific Gravity, Urine: 1.023 (ref 1.005–1.030)
pH: 6.5 (ref 5.0–8.0)

## 2013-04-04 NOTE — Progress Notes (Signed)
VASCULAR LAB PRELIMINARY  PRELIMINARY  PRELIMINARY  PRELIMINARY  Left lower extremity venous Doppler completed.    Preliminary report:  There is no DVT or SVT noted in the left lower extremity.  Cheril Slattery, RVT 04/04/2013, 4:35 PM

## 2013-04-04 NOTE — ED Provider Notes (Signed)
History     CSN: [id number]  Arrival date & time 04/04/13  1426   First MD Initiated Contact with Patient 04/04/13 1510      Chief Complaint  Patient presents with  . Leg Swelling  . leg infection      HPI Pt states has had a L lower leg infection x 2 weeks, been on anitbiotics, now on doxycycline states this one is not helping either, states had a leg biopsy done to see what antibiotic needed, states having leg swelling/reddness and soreness.   Past Medical History  Diagnosis Date  . Blind left eye   . Restless leg   . Asthma     AS A CHILD  . Chronic kidney disease   . Cancer     PROSTATE CANCER    Past Surgical History  Procedure Laterality Date  . Prostatectomy    . Rigth nephrectomy  2006  . Right cataract extraction  2013  . Tonsillectomy      Family History  Problem Relation Age of Onset  . Bone cancer Mother     History  Substance Use Topics  . Smoking status: Never Smoker   . Smokeless tobacco: Never Used  . Alcohol Use: No     Comment: QUIT ALCOHOL AT AGE 64      Review of Systems All other systems reviewed and are negative Allergies  Peanuts  Home Medications   Current Outpatient Rx  Name  Route  Sig  Dispense  Refill  . doxycycline (VIBRA-TABS) 100 MG tablet   Oral   Take 100 mg by mouth 2 (two) times daily.         . fluticasone (FLONASE) 50 MCG/ACT nasal spray   Nasal   Place 2 sprays into the nose every evening.          Marland Kitchen oxyCODONE (OXYCONTIN) 20 MG 12 hr tablet   Oral   Take 40 mg by mouth 3 (three) times daily.          Marland Kitchen oxyCODONE-acetaminophen (PERCOCET) 10-325 MG per tablet   Oral   Take 1 tablet by mouth 4 (four) times daily.          . polyethylene glycol (MIRALAX / GLYCOLAX) packet   Oral   Take 17 g by mouth every evening.          Marland Kitchen rOPINIRole (REQUIP) 1 MG tablet   Oral   Take 1 mg by mouth 4 (four) times daily.         Marland Kitchen triamcinolone cream (KENALOG) 0.1 %   Topical   Apply 1 application  topically 2 (two) times daily.           BP 116/60  Pulse 75  Temp(Src) 98.3 F (36.8 C) (Oral)  Resp 14  SpO2 94%  Physical Exam  Nursing note and vitals reviewed. Constitutional: He is oriented to person, place, and time. He appears well-developed and well-nourished. No distress.  HENT:  Head: Normocephalic and atraumatic.  Eyes: Pupils are equal, round, and reactive to light.  Neck: Normal range of motion.  Cardiovascular: Normal rate and intact distal pulses.   Pulmonary/Chest: No respiratory distress.  Abdominal: Normal appearance. He exhibits no distension.  Musculoskeletal: Normal range of motion. He exhibits edema.       Left lower leg: He exhibits tenderness (Mild), swelling and edema.  Neurological: He is alert and oriented to person, place, and time. No cranial nerve deficit.  Skin: Skin is warm and dry. No  rash noted.  Psychiatric: He has a normal mood and affect. His behavior is normal.    ED Course  Procedures (including critical care time)  Labs Reviewed  COMPREHENSIVE METABOLIC PANEL - Abnormal; Notable for the following:    Glucose, Bld 100 (*)    Albumin 3.4 (*)    GFR calc non Af Amer 59 (*)    GFR calc Af Amer 68 (*)    All other components within normal limits  CBC WITH DIFFERENTIAL  PRO B NATRIURETIC PEPTIDE  URINALYSIS, ROUTINE W REFLEX MICROSCOPIC   No results found.   1. Venous insufficiency       MDM         Nelia Shi, MD 04/04/13 1753

## 2013-04-04 NOTE — ED Notes (Signed)
Pt states has had a L lower leg infection x 2 weeks, been on anitbiotics, now on doxycycline states this one is not helping either, states had a leg biopsy done to see what antibiotic needed, states having leg swelling/reddness and soreness.

## 2013-04-07 DIAGNOSIS — M47817 Spondylosis without myelopathy or radiculopathy, lumbosacral region: Secondary | ICD-10-CM | POA: Diagnosis not present

## 2013-04-07 DIAGNOSIS — M25559 Pain in unspecified hip: Secondary | ICD-10-CM | POA: Diagnosis not present

## 2013-04-14 DIAGNOSIS — M48061 Spinal stenosis, lumbar region without neurogenic claudication: Secondary | ICD-10-CM | POA: Diagnosis not present

## 2013-04-14 DIAGNOSIS — M5137 Other intervertebral disc degeneration, lumbosacral region: Secondary | ICD-10-CM | POA: Diagnosis not present

## 2013-04-14 DIAGNOSIS — M4120 Other idiopathic scoliosis, site unspecified: Secondary | ICD-10-CM | POA: Diagnosis not present

## 2013-04-14 DIAGNOSIS — M545 Low back pain: Secondary | ICD-10-CM | POA: Diagnosis not present

## 2013-04-17 DIAGNOSIS — L0291 Cutaneous abscess, unspecified: Secondary | ICD-10-CM | POA: Diagnosis not present

## 2013-04-17 DIAGNOSIS — L0390 Cellulitis, unspecified: Secondary | ICD-10-CM | POA: Diagnosis not present

## 2013-04-19 DIAGNOSIS — L259 Unspecified contact dermatitis, unspecified cause: Secondary | ICD-10-CM | POA: Diagnosis not present

## 2013-04-21 DIAGNOSIS — M47817 Spondylosis without myelopathy or radiculopathy, lumbosacral region: Secondary | ICD-10-CM | POA: Diagnosis not present

## 2013-04-21 DIAGNOSIS — H60399 Other infective otitis externa, unspecified ear: Secondary | ICD-10-CM | POA: Diagnosis not present

## 2013-05-07 ENCOUNTER — Encounter (HOSPITAL_COMMUNITY): Payer: Self-pay | Admitting: *Deleted

## 2013-05-07 ENCOUNTER — Emergency Department (HOSPITAL_COMMUNITY)
Admission: EM | Admit: 2013-05-07 | Discharge: 2013-05-07 | Disposition: A | Discharge status: Home / Self Care | Payer: Medicare Other | Attending: Emergency Medicine | Admitting: Emergency Medicine

## 2013-05-07 DIAGNOSIS — Z8546 Personal history of malignant neoplasm of prostate: Secondary | ICD-10-CM | POA: Diagnosis not present

## 2013-05-07 DIAGNOSIS — H5440 Blindness, one eye, unspecified eye: Secondary | ICD-10-CM | POA: Insufficient documentation

## 2013-05-07 DIAGNOSIS — Z79899 Other long term (current) drug therapy: Secondary | ICD-10-CM | POA: Diagnosis not present

## 2013-05-07 DIAGNOSIS — G8929 Other chronic pain: Secondary | ICD-10-CM | POA: Insufficient documentation

## 2013-05-07 DIAGNOSIS — G2581 Restless legs syndrome: Secondary | ICD-10-CM | POA: Insufficient documentation

## 2013-05-07 DIAGNOSIS — R55 Syncope and collapse: Secondary | ICD-10-CM | POA: Diagnosis not present

## 2013-05-07 DIAGNOSIS — N189 Chronic kidney disease, unspecified: Secondary | ICD-10-CM | POA: Insufficient documentation

## 2013-05-07 DIAGNOSIS — I452 Bifascicular block: Secondary | ICD-10-CM | POA: Diagnosis not present

## 2013-05-07 DIAGNOSIS — Z8709 Personal history of other diseases of the respiratory system: Secondary | ICD-10-CM | POA: Insufficient documentation

## 2013-05-07 LAB — CBC WITH DIFFERENTIAL/PLATELET
Basophils Absolute: 0 10*3/uL (ref 0.0–0.1)
Basophils Relative: 0 % (ref 0–1)
HCT: 40 % (ref 39.0–52.0)
Lymphocytes Relative: 25 % (ref 12–46)
Neutro Abs: 4.8 10*3/uL (ref 1.7–7.7)
Neutrophils Relative %: 64 % (ref 43–77)
Platelets: 152 10*3/uL (ref 150–400)
RDW: 12.4 % (ref 11.5–15.5)
WBC: 7.4 10*3/uL (ref 4.0–10.5)

## 2013-05-07 LAB — COMPREHENSIVE METABOLIC PANEL
ALT: 15 U/L (ref 0–53)
AST: 21 U/L (ref 0–37)
Albumin: 3.5 g/dL (ref 3.5–5.2)
CO2: 24 mEq/L (ref 19–32)
Chloride: 102 mEq/L (ref 96–112)
GFR calc non Af Amer: 78 mL/min — ABNORMAL LOW (ref >90)
Potassium: 4.3 mEq/L (ref 3.5–5.1)
Sodium: 135 mEq/L (ref 135–145)
Total Bilirubin: 0.4 mg/dL (ref 0.3–1.2)

## 2013-05-07 NOTE — ED Notes (Signed)
Nurse attempting lab draw with IV start 

## 2013-05-07 NOTE — ED Provider Notes (Signed)
History     CSN: [id number] Arrival date & time 05/07/13  1643 First MD Initiated Contact with Patient 05/07/13 1648      Chief Complaint  Patient presents with  . Near fainting   HPI Patient with a history of presyncope presents with presyncopal event x 2. Patient states he was in his normal state of health in recent days. This morning he states he felt somewhat weak. He was walking around his apartment at his independent living facility and states that all of a sudden he got lightheaded. He went to sit down on his bed because he was worried he was going to "black out". He sat on the side of the bed and states that his vision completely went out and that he blacked out for 2-3 seconds. He remained upright and did not lose tone. He was concerned enough to call his son who came by. Another event did not occur until around noon and was similar to above. Patient denies doing anything such as exerting himself, urinating, defecating, having hands above head for prolonged period, coughing, laughing when these events occurred. Denies chest pain, palpitations, shortness of breath, diaphoresis. He has not had any similar events since noon.   Patient was admitted to hospital after a similar occurrence in January of this year. He was found to hav ea left anterior fasicular block on EKG. He was monitored on telemetry without events. He had an echocardiogram with EF 65%. Normal labwork at that time included a TSH, BNP, CBC, CMP). No carotid dopplers were completed at that time. [facility] was consulted and patient established with Dr. Katrinka Blazing. Patient had an event monitor for 4 weeks after discharge and there were no events of high grade block or bradycardia discovered. There was concern that this could be medication induced and requip was reduced from 5x a day to 4x a day.   Past Medical History  Diagnosis Date  . Blind left eye   . Restless leg   . Asthma     AS A CHILD  . Chronic kidney disease   .  Cancer     PROSTATE CANCER   Past Surgical History  Procedure Laterality Date  . Prostatectomy    . Rigth nephrectomy  2006  . Right cataract extraction  2013  . Tonsillectomy     Family History  Problem Relation Age of Onset  . Bone cancer Mother     History  Substance Use Topics  . Smoking status: Never Smoker   . Smokeless tobacco: Never Used  . Alcohol Use: No     Comment: QUIT ALCOHOL AT AGE 51    Review of Systems A full 10 point review of symptoms was performed and was negative except as noted in HPI.   Allergies  Peanuts and Coconut oil  Home Medications   Current Outpatient Rx  Name  Route  Sig  Dispense  Refill  . doxycycline (VIBRA-TABS) 100 MG tablet   Oral   Take 100 mg by mouth 2 (two) times daily. For 30 days.    For chronic infection of LLE? prescribed per PCP per patient. Controlled at this time.      . fluocinonide cream (LIDEX) 0.05 %   Topical   Apply 1 application topically as needed (Rash on leg. Marland Kitchen).         Marland Kitchen fluticasone (FLONASE) 50 MCG/ACT nasal spray   Nasal   Place 2 sprays into the nose every evening.          Marland Kitchen  mupirocin ointment (BACTROBAN) 2 %   Topical   Apply 1 application topically 3 (three) times daily.         Marland Kitchen oxyCODONE (OXYCONTIN) 20 MG 12 hr tablet   Oral   Take 20 mg by mouth 3 (three) times daily.          Marland Kitchen oxyCODONE-acetaminophen (PERCOCET) 10-325 MG per tablet   Oral   Take 1 tablet by mouth 4 (four) times daily.          . polyethylene glycol (MIRALAX / GLYCOLAX) packet   Oral   Take 17 g by mouth every evening.          Marland Kitchen rOPINIRole (REQUIP) 1 MG tablet   Oral   Take 1 mg by mouth 4 (four) times daily.         Marland Kitchen triamcinolone cream (KENALOG) 0.1 %   Topical   Apply 1 application topically 2 (two) times daily.         . Vitamin D, Ergocalciferol, (DRISDOL) 50000 UNITS CAPS   Oral   Take 50,000 Units by mouth every 7 (seven) days.           BP 154/94  Pulse 83  Temp(Src) 98.7  F (37.1 C) (Oral)  Resp 18  SpO2 94%  Physical Exam  Constitutional: He is oriented to person, place, and time. He appears well-developed and well-nourished. He appears distressed.  Elderly  HENT:  Head: Normocephalic and atraumatic.  Eyes: EOM are normal. Pupils are equal, round, and reactive to light.  Neck: Normal range of motion. Neck supple. No thyromegaly present.  Cardiovascular: Normal rate and regular rhythm.  Exam reveals no gallop and no friction rub.   No murmur heard. HR approximately 70 throughout stay on telemetry. No JVD.   Pulmonary/Chest: Effort normal and breath sounds normal. He has no wheezes. He has no rales.  Abdominal: Soft. Bowel sounds are normal.  Musculoskeletal: Normal range of motion. He exhibits edema (1+ bilateral. Wearing compression stockings. ).  Neurological: He is alert and oriented to person, place, and time. No cranial nerve deficit. He exhibits normal muscle tone. Coordination normal.  Skin: Skin is warm and dry.  Erythematous macular rash on medial side of left knee. Not warm to touch.    EKG-normal sinus rhythm with rate of 74. Left anterior fasicular blcok noted unchanged from previous. No sta or t wave abnormalities noted. Compared to study from 05/07/13.   ED Course  Procedures (including critical care time)  Labs Reviewed  COMPREHENSIVE METABOLIC PANEL - Abnormal; Notable for the following:    Glucose, Bld 104 (*)    GFR calc non Af Amer 78 (*)    All other components within normal limits  CBC WITH DIFFERENTIAL  TROPONIN I   No results found.  1. Pre-syncope   2. Bifascicular bundle branch block   3. Chronic pain    MDM  79 year old male with a history of pre-syncope in January with negative cardiac workup (labs, echo with EF65%, EKG with left anterior fasicular block, and subsequent event monitor x 1 month without events) presenting with recurrent pre-syncope.   Workup today included CBC, CMET, unchanged EKG, and troponin.  Patient was asymptomatic throughout his stay and no events noted on telemetry. Discussed case with Dr. Isabel Caprice by phone who stated without evidence of bradycardia or other arythmia noted on event monitor or on telemetry throughout stays that this has a very low likelihood of being cardiac/an arythmia related issue.  Etiology could possibly medication related given patient taking 160mg  oxycodone equivalent per day. Discussed with patient and family that decrease in this or requip might be considered. Discussed that observation would be reasonable vs. Close follow up and return if symptoms recurred. Family/patient opted for discharge and close follow up.  Would ask that PCP or cardiology consider carotid dopplers outpatient Dr. Isabel Caprice of [facility] follow up within the week.  Advised patient to follow up with PCP this week as well.   Shelva Majestic, MD 05/07/13 2250

## 2013-05-07 NOTE — ED Notes (Signed)
Pt reports 2 syncope episodes and multiple near syncope episodes today. States he was passed out for 15-20 seconds. Denies any injuries. States this has happened before, was hospitalized but not diagnosed with anything cardiac related to explain syncope episodes

## 2013-05-08 NOTE — ED Provider Notes (Signed)
I saw and evaluated the patient, reviewed the resident's note and I agree with the findings and plan.   .Face to face Exam:  General:  Awake HEENT:  Atraumatic Resp:  Normal effort Abd:  Nondistended Neuro:No focal weakness Lymph: No adenopathy   Nelia Shi, MD 05/08/13 1540

## 2013-05-10 ENCOUNTER — Other Ambulatory Visit: Payer: BC Managed Care – PPO

## 2013-05-11 ENCOUNTER — Other Ambulatory Visit (INDEPENDENT_AMBULATORY_CARE_PROVIDER_SITE_OTHER): Payer: Medicare Other | Admitting: *Deleted

## 2013-05-11 DIAGNOSIS — R55 Syncope and collapse: Secondary | ICD-10-CM | POA: Diagnosis not present

## 2013-05-28 DIAGNOSIS — I8310 Varicose veins of unspecified lower extremity with inflammation: Secondary | ICD-10-CM | POA: Diagnosis not present

## 2013-05-29 DIAGNOSIS — M48061 Spinal stenosis, lumbar region without neurogenic claudication: Secondary | ICD-10-CM | POA: Diagnosis not present

## 2013-05-29 DIAGNOSIS — M4120 Other idiopathic scoliosis, site unspecified: Secondary | ICD-10-CM | POA: Diagnosis not present

## 2013-05-29 DIAGNOSIS — M5137 Other intervertebral disc degeneration, lumbosacral region: Secondary | ICD-10-CM | POA: Diagnosis not present

## 2013-06-05 DIAGNOSIS — M5137 Other intervertebral disc degeneration, lumbosacral region: Secondary | ICD-10-CM | POA: Diagnosis not present

## 2013-06-07 DIAGNOSIS — R262 Difficulty in walking, not elsewhere classified: Secondary | ICD-10-CM | POA: Diagnosis not present

## 2013-06-07 DIAGNOSIS — M6281 Muscle weakness (generalized): Secondary | ICD-10-CM | POA: Diagnosis not present

## 2013-06-11 DIAGNOSIS — M6281 Muscle weakness (generalized): Secondary | ICD-10-CM | POA: Diagnosis not present

## 2013-06-11 DIAGNOSIS — M5137 Other intervertebral disc degeneration, lumbosacral region: Secondary | ICD-10-CM | POA: Diagnosis not present

## 2013-06-11 DIAGNOSIS — R262 Difficulty in walking, not elsewhere classified: Secondary | ICD-10-CM | POA: Diagnosis not present

## 2013-06-14 DIAGNOSIS — R262 Difficulty in walking, not elsewhere classified: Secondary | ICD-10-CM | POA: Diagnosis not present

## 2013-06-14 DIAGNOSIS — M6281 Muscle weakness (generalized): Secondary | ICD-10-CM | POA: Diagnosis not present

## 2013-06-16 DIAGNOSIS — M6281 Muscle weakness (generalized): Secondary | ICD-10-CM | POA: Diagnosis not present

## 2013-06-16 DIAGNOSIS — R262 Difficulty in walking, not elsewhere classified: Secondary | ICD-10-CM | POA: Diagnosis not present

## 2013-06-17 DIAGNOSIS — M6281 Muscle weakness (generalized): Secondary | ICD-10-CM | POA: Diagnosis not present

## 2013-06-17 DIAGNOSIS — R262 Difficulty in walking, not elsewhere classified: Secondary | ICD-10-CM | POA: Diagnosis not present

## 2013-06-21 DIAGNOSIS — M6281 Muscle weakness (generalized): Secondary | ICD-10-CM | POA: Diagnosis not present

## 2013-06-21 DIAGNOSIS — R262 Difficulty in walking, not elsewhere classified: Secondary | ICD-10-CM | POA: Diagnosis not present

## 2013-06-22 DIAGNOSIS — M545 Low back pain: Secondary | ICD-10-CM | POA: Diagnosis not present

## 2013-06-22 DIAGNOSIS — M5137 Other intervertebral disc degeneration, lumbosacral region: Secondary | ICD-10-CM | POA: Diagnosis not present

## 2013-06-23 DIAGNOSIS — M6281 Muscle weakness (generalized): Secondary | ICD-10-CM | POA: Diagnosis not present

## 2013-06-23 DIAGNOSIS — R262 Difficulty in walking, not elsewhere classified: Secondary | ICD-10-CM | POA: Diagnosis not present

## 2013-06-24 DIAGNOSIS — M6281 Muscle weakness (generalized): Secondary | ICD-10-CM | POA: Diagnosis not present

## 2013-06-24 DIAGNOSIS — R262 Difficulty in walking, not elsewhere classified: Secondary | ICD-10-CM | POA: Diagnosis not present

## 2013-06-25 DIAGNOSIS — R262 Difficulty in walking, not elsewhere classified: Secondary | ICD-10-CM | POA: Diagnosis not present

## 2013-06-25 DIAGNOSIS — M6281 Muscle weakness (generalized): Secondary | ICD-10-CM | POA: Diagnosis not present

## 2013-06-29 DIAGNOSIS — R55 Syncope and collapse: Secondary | ICD-10-CM | POA: Diagnosis not present

## 2013-06-29 DIAGNOSIS — M6281 Muscle weakness (generalized): Secondary | ICD-10-CM | POA: Diagnosis not present

## 2013-06-29 DIAGNOSIS — IMO0002 Reserved for concepts with insufficient information to code with codable children: Secondary | ICD-10-CM | POA: Diagnosis not present

## 2013-06-29 DIAGNOSIS — R262 Difficulty in walking, not elsewhere classified: Secondary | ICD-10-CM | POA: Diagnosis not present

## 2013-06-29 DIAGNOSIS — M810 Age-related osteoporosis without current pathological fracture: Secondary | ICD-10-CM | POA: Diagnosis not present

## 2013-06-29 DIAGNOSIS — B351 Tinea unguium: Secondary | ICD-10-CM | POA: Diagnosis not present

## 2013-06-29 DIAGNOSIS — L259 Unspecified contact dermatitis, unspecified cause: Secondary | ICD-10-CM | POA: Diagnosis not present

## 2013-06-29 DIAGNOSIS — M5137 Other intervertebral disc degeneration, lumbosacral region: Secondary | ICD-10-CM | POA: Diagnosis not present

## 2013-06-29 DIAGNOSIS — E559 Vitamin D deficiency, unspecified: Secondary | ICD-10-CM | POA: Diagnosis not present

## 2013-06-29 DIAGNOSIS — R1310 Dysphagia, unspecified: Secondary | ICD-10-CM | POA: Diagnosis not present

## 2013-06-30 DIAGNOSIS — M6281 Muscle weakness (generalized): Secondary | ICD-10-CM | POA: Diagnosis not present

## 2013-06-30 DIAGNOSIS — R262 Difficulty in walking, not elsewhere classified: Secondary | ICD-10-CM | POA: Diagnosis not present

## 2013-07-01 DIAGNOSIS — R262 Difficulty in walking, not elsewhere classified: Secondary | ICD-10-CM | POA: Diagnosis not present

## 2013-07-01 DIAGNOSIS — M6281 Muscle weakness (generalized): Secondary | ICD-10-CM | POA: Diagnosis not present

## 2013-07-05 DIAGNOSIS — R262 Difficulty in walking, not elsewhere classified: Secondary | ICD-10-CM | POA: Diagnosis not present

## 2013-07-05 DIAGNOSIS — M6281 Muscle weakness (generalized): Secondary | ICD-10-CM | POA: Diagnosis not present

## 2013-07-06 DIAGNOSIS — M6281 Muscle weakness (generalized): Secondary | ICD-10-CM | POA: Diagnosis not present

## 2013-07-06 DIAGNOSIS — R262 Difficulty in walking, not elsewhere classified: Secondary | ICD-10-CM | POA: Diagnosis not present

## 2013-07-12 DIAGNOSIS — M545 Low back pain: Secondary | ICD-10-CM | POA: Diagnosis not present

## 2013-07-19 DIAGNOSIS — M2010 Hallux valgus (acquired), unspecified foot: Secondary | ICD-10-CM | POA: Diagnosis not present

## 2013-07-19 DIAGNOSIS — M79609 Pain in unspecified limb: Secondary | ICD-10-CM | POA: Diagnosis not present

## 2013-07-19 DIAGNOSIS — B351 Tinea unguium: Secondary | ICD-10-CM | POA: Diagnosis not present

## 2013-07-19 DIAGNOSIS — M2040 Other hammer toe(s) (acquired), unspecified foot: Secondary | ICD-10-CM | POA: Diagnosis not present

## 2013-07-19 DIAGNOSIS — M779 Enthesopathy, unspecified: Secondary | ICD-10-CM | POA: Diagnosis not present

## 2013-07-22 DIAGNOSIS — M5137 Other intervertebral disc degeneration, lumbosacral region: Secondary | ICD-10-CM | POA: Diagnosis not present

## 2013-07-22 DIAGNOSIS — M48061 Spinal stenosis, lumbar region without neurogenic claudication: Secondary | ICD-10-CM | POA: Diagnosis not present

## 2013-07-22 DIAGNOSIS — M4120 Other idiopathic scoliosis, site unspecified: Secondary | ICD-10-CM | POA: Diagnosis not present

## 2013-08-09 DIAGNOSIS — M48061 Spinal stenosis, lumbar region without neurogenic claudication: Secondary | ICD-10-CM | POA: Diagnosis not present

## 2013-08-09 DIAGNOSIS — M5137 Other intervertebral disc degeneration, lumbosacral region: Secondary | ICD-10-CM | POA: Diagnosis not present

## 2013-08-09 DIAGNOSIS — M545 Low back pain: Secondary | ICD-10-CM | POA: Diagnosis not present

## 2013-08-10 DIAGNOSIS — R300 Dysuria: Secondary | ICD-10-CM | POA: Diagnosis not present

## 2013-08-10 DIAGNOSIS — M810 Age-related osteoporosis without current pathological fracture: Secondary | ICD-10-CM | POA: Diagnosis not present

## 2013-08-29 DIAGNOSIS — Z23 Encounter for immunization: Secondary | ICD-10-CM | POA: Diagnosis not present

## 2013-08-30 ENCOUNTER — Emergency Department (HOSPITAL_COMMUNITY): Payer: Medicare Other

## 2013-08-30 ENCOUNTER — Encounter (HOSPITAL_COMMUNITY): Payer: Self-pay | Admitting: Emergency Medicine

## 2013-08-30 ENCOUNTER — Emergency Department (HOSPITAL_COMMUNITY)
Admission: EM | Admit: 2013-08-30 | Discharge: 2013-08-30 | Disposition: A | Discharge status: Home / Self Care | Payer: Medicare Other | Attending: Emergency Medicine | Admitting: Emergency Medicine

## 2013-08-30 DIAGNOSIS — R55 Syncope and collapse: Secondary | ICD-10-CM | POA: Insufficient documentation

## 2013-08-30 DIAGNOSIS — G2581 Restless legs syndrome: Secondary | ICD-10-CM | POA: Insufficient documentation

## 2013-08-30 DIAGNOSIS — H5440 Blindness, one eye, unspecified eye: Secondary | ICD-10-CM | POA: Insufficient documentation

## 2013-08-30 DIAGNOSIS — Z79899 Other long term (current) drug therapy: Secondary | ICD-10-CM | POA: Diagnosis not present

## 2013-08-30 DIAGNOSIS — Q742 Other congenital malformations of lower limb(s), including pelvic girdle: Secondary | ICD-10-CM | POA: Diagnosis not present

## 2013-08-30 DIAGNOSIS — Z8546 Personal history of malignant neoplasm of prostate: Secondary | ICD-10-CM | POA: Diagnosis not present

## 2013-08-30 DIAGNOSIS — B351 Tinea unguium: Secondary | ICD-10-CM | POA: Diagnosis not present

## 2013-08-30 DIAGNOSIS — M5137 Other intervertebral disc degeneration, lumbosacral region: Secondary | ICD-10-CM | POA: Diagnosis not present

## 2013-08-30 DIAGNOSIS — J45909 Unspecified asthma, uncomplicated: Secondary | ICD-10-CM | POA: Diagnosis not present

## 2013-08-30 DIAGNOSIS — N189 Chronic kidney disease, unspecified: Secondary | ICD-10-CM | POA: Diagnosis not present

## 2013-08-30 DIAGNOSIS — M810 Age-related osteoporosis without current pathological fracture: Secondary | ICD-10-CM | POA: Diagnosis not present

## 2013-08-30 DIAGNOSIS — H547 Unspecified visual loss: Secondary | ICD-10-CM | POA: Insufficient documentation

## 2013-08-30 DIAGNOSIS — IMO0002 Reserved for concepts with insufficient information to code with codable children: Secondary | ICD-10-CM | POA: Diagnosis not present

## 2013-08-30 DIAGNOSIS — R1310 Dysphagia, unspecified: Secondary | ICD-10-CM | POA: Diagnosis not present

## 2013-08-30 DIAGNOSIS — R42 Dizziness and giddiness: Secondary | ICD-10-CM | POA: Insufficient documentation

## 2013-08-30 DIAGNOSIS — R404 Transient alteration of awareness: Secondary | ICD-10-CM | POA: Diagnosis not present

## 2013-08-30 LAB — CBC WITH DIFFERENTIAL/PLATELET
Basophils Absolute: 0 10*3/uL (ref 0.0–0.1)
Eosinophils Absolute: 0.3 10*3/uL (ref 0.0–0.7)
Eosinophils Relative: 4 % (ref 0–5)
HCT: 39.7 % (ref 39.0–52.0)
Lymphocytes Relative: 24 % (ref 12–46)
Lymphs Abs: 1.5 10*3/uL (ref 0.7–4.0)
MCH: 29.1 pg (ref 26.0–34.0)
MCV: 88.8 fL (ref 78.0–100.0)
Monocytes Absolute: 0.6 10*3/uL (ref 0.1–1.0)
Platelets: 145 10*3/uL — ABNORMAL LOW (ref 150–400)
RDW: 12.3 % (ref 11.5–15.5)

## 2013-08-30 LAB — BASIC METABOLIC PANEL
CO2: 26 mEq/L (ref 19–32)
Calcium: 9 mg/dL (ref 8.4–10.5)
Creatinine, Ser: 0.92 mg/dL (ref 0.50–1.35)
GFR calc non Af Amer: 75 mL/min — ABNORMAL LOW (ref >90)
Glucose, Bld: 100 mg/dL — ABNORMAL HIGH (ref 70–99)
Sodium: 136 mEq/L (ref 135–145)

## 2013-08-30 NOTE — ED Provider Notes (Signed)
CSN: [id number]     Arrival date & time 08/30/13  1505 History   First MD Initiated Contact with Patient 08/30/13 1517     Chief Complaint  Patient presents with  . Near Syncope   (Consider location/radiation/quality/duration/timing/severity/associated sxs/prior Treatment) Patient is a 79 y.o. male presenting with syncope. The history is provided by the patient. No language interpreter was used.  Loss of Consciousness Episode history:  Single Most recent episode:  Today Timing:  Rare Progression:  Resolved Chronicity:  New Context: inactivity and sitting down   Witnessed: yes   Relieved by:  Nothing Worsened by:  Nothing tried Ineffective treatments:  None tried Associated symptoms: dizziness and visual change   Associated symptoms: no chest pain, no confusion, no diaphoresis, no difficulty breathing, no fever, no focal sensory loss, no focal weakness, no headaches, no malaise/fatigue, no nausea, no palpitations, no recent fall, no recent injury, no shortness of breath, no vomiting and no weakness   Visual Change:    Location:  Both eyes   Quality: decreased vision     Quality comment:  "blackout"   Onset quality:  Sudden   Severity:  Moderate   Duration: 2-3 seconds.   Timing:  Rare   Progression:  Resolved   Chronicity:  Recurrent Risk factors: no coronary artery disease and no vascular disease     Past Medical History  Diagnosis Date  . Blind left eye   . Restless leg   . Asthma     AS A CHILD  . Chronic kidney disease   . Cancer     PROSTATE CANCER   Past Surgical History  Procedure Laterality Date  . Prostatectomy    . Rigth nephrectomy  2006  . Right cataract extraction  2013  . Tonsillectomy     Family History  Problem Relation Age of Onset  . Bone cancer Mother    History  Substance Use Topics  . Smoking status: Never Smoker   . Smokeless tobacco: Never Used  . Alcohol Use: No     Comment: QUIT ALCOHOL AT AGE 20    Review of Systems   Constitutional: Negative for fever, malaise/fatigue, diaphoresis, activity change, appetite change and fatigue.  HENT: Negative for congestion, facial swelling, rhinorrhea and trouble swallowing.   Eyes: Negative for photophobia and pain.  Respiratory: Negative for cough, chest tightness and shortness of breath.   Cardiovascular: Positive for syncope. Negative for chest pain, palpitations and leg swelling.  Gastrointestinal: Negative for nausea, vomiting, abdominal pain, diarrhea and constipation.  Endocrine: Negative for polydipsia and polyuria.  Genitourinary: Negative for dysuria, urgency, decreased urine volume and difficulty urinating.  Musculoskeletal: Negative for back pain and gait problem.  Skin: Negative for color change, rash and wound.  Allergic/Immunologic: Negative for immunocompromised state.  Neurological: Positive for dizziness. Negative for focal weakness, facial asymmetry, speech difficulty, weakness, numbness and headaches.  Psychiatric/Behavioral: Negative for confusion, decreased concentration and agitation.    Allergies  Peanuts and Coconut oil  Home Medications   Current Outpatient Rx  Name  Route  Sig  Dispense  Refill  . Cholecalciferol (VITAMIN D PO)   Oral   Take 1 tablet by mouth daily.         Marland Kitchen doxycycline (VIBRA-TABS) 100 MG tablet   Oral   Take 100 mg by mouth 2 (two) times daily. For 30 days.         . fluticasone (FLONASE) 50 MCG/ACT nasal spray   Nasal   Place 2 sprays  into the nose every evening.          Marland Kitchen morphine (KADIAN) 30 MG 24 hr capsule   Oral   Take 30 mg by mouth daily.         Marland Kitchen oxyCODONE-acetaminophen (PERCOCET) 10-325 MG per tablet   Oral   Take 1 tablet by mouth daily as needed for pain.          . polyethylene glycol (MIRALAX / GLYCOLAX) packet   Oral   Take 17 g by mouth 2 (two) times daily as needed. For constipation         . rOPINIRole (REQUIP) 1 MG tablet   Oral   Take 1 mg by mouth 4 (four) times  daily.          BP 133/69  Pulse 71  Temp(Src) 98.7 F (37.1 C) (Oral)  Resp 20  SpO2 98% Physical Exam  Constitutional: He is oriented to person, place, and time. He appears well-developed and well-nourished. No distress.  HENT:  Head: Normocephalic and atraumatic.  Mouth/Throat: No oropharyngeal exudate.  Eyes: Pupils are equal, round, and reactive to light.  Neck: Normal range of motion. Neck supple.  Cardiovascular: Normal rate, regular rhythm and normal heart sounds.  Exam reveals no gallop and no friction rub.   No murmur heard. Pulmonary/Chest: Effort normal and breath sounds normal. No respiratory distress. He has no wheezes. He has no rales.  Abdominal: Soft. Bowel sounds are normal. He exhibits no distension and no mass. There is no tenderness. There is no rebound and no guarding.  Musculoskeletal: Normal range of motion. He exhibits no edema and no tenderness.  Neurological: He is alert and oriented to person, place, and time.  Skin: Skin is warm and dry.  Psychiatric: He has a normal mood and affect.    ED Course  Procedures (including critical care time) Labs Review Labs Reviewed  CBC WITH DIFFERENTIAL - Abnormal; Notable for the following:    Platelets 145 (*)    All other components within normal limits  BASIC METABOLIC PANEL - Abnormal; Notable for the following:    Glucose, Bld 100 (*)    GFR calc non Af Amer 75 (*)    GFR calc Af Amer 87 (*)    All other components within normal limits  TROPONIN I   Imaging Review Dg Chest 2 View  08/30/2013   CLINICAL DATA:  Syncope.  EXAM: CHEST  2 VIEW  COMPARISON:  12/31/2012.  FINDINGS: The cardiac silhouette, mediastinal and hilar contours are stable. There is tortuosity and mild ectasia of the thoracic aorta. Stable eventration of the right hemidiaphragm overlying vascular crowding and scarring. No infiltrates, edema or effusions.  IMPRESSION: No acute cardiopulmonary findings. Chronic basilar scarring changes on  the right.   Electronically Signed   By: Loralie Champagne M.D.   On: 08/30/2013 18:00     Date: 08/30/2013  Rate: 76  Rhythm: normal sinus rhythm  QRS Axis: normal  Intervals: QRS prolongation  ST/T Wave abnormalities: normal  Conduction Disutrbances:left anterior fascicular block  Narrative Interpretation:   Old EKG Reviewed: unchanged    MDM   1. Near syncope    Pt is a 79 y.o. male with Pmhx as above who presents with 2-3 second episode of vision going dark and sensation of near syncope. Denies CP, SOB, palpitations, n/v, diaphoresis. Pt had similar episode in Jun and also several in Jan which he was admitted for, and unremarkable echo, and wore event monitor for  1 month w/o arrythmia.  Currently asymptomatic. CBC, BMP, trop unremarkable. Given prior unremarkable w/u, I feel cardiogenic syncope less likely.  Also doubt CVA/TIA, ACS.  I have spoke to [facility] about close follow up.  They agreee to see him in office tomorrow. Return precautions given for new or worsening symptoms.  1. Near syncope         Shanna Cisco, MD 08/30/13 [phone]

## 2013-08-30 NOTE — ED Notes (Signed)
Per EMS: Pt watching TV this afternoon when sudden onset of "black out in his eyes" lasting appx 30 sec, resolved. Pt denies any other episodes. Pt denies CP, palpitations, N/V, diaphoresis. Pt seen by PCP this AM, normal exam. AO x4. 72 NSR. 140/70. CBG 143.

## 2013-09-06 DIAGNOSIS — M48061 Spinal stenosis, lumbar region without neurogenic claudication: Secondary | ICD-10-CM | POA: Diagnosis not present

## 2013-10-01 DIAGNOSIS — L02419 Cutaneous abscess of limb, unspecified: Secondary | ICD-10-CM | POA: Diagnosis not present

## 2013-10-04 DIAGNOSIS — M48061 Spinal stenosis, lumbar region without neurogenic claudication: Secondary | ICD-10-CM | POA: Diagnosis not present

## 2013-10-05 DIAGNOSIS — L0291 Cutaneous abscess, unspecified: Secondary | ICD-10-CM | POA: Diagnosis not present

## 2013-10-05 DIAGNOSIS — I8310 Varicose veins of unspecified lower extremity with inflammation: Secondary | ICD-10-CM | POA: Diagnosis not present

## 2013-10-05 DIAGNOSIS — B354 Tinea corporis: Secondary | ICD-10-CM | POA: Diagnosis not present

## 2013-10-11 ENCOUNTER — Ambulatory Visit: Payer: Self-pay | Admitting: Podiatry

## 2013-10-20 DIAGNOSIS — M48061 Spinal stenosis, lumbar region without neurogenic claudication: Secondary | ICD-10-CM | POA: Diagnosis not present

## 2013-10-21 DIAGNOSIS — L259 Unspecified contact dermatitis, unspecified cause: Secondary | ICD-10-CM | POA: Diagnosis not present

## 2013-10-21 DIAGNOSIS — D485 Neoplasm of uncertain behavior of skin: Secondary | ICD-10-CM | POA: Diagnosis not present

## 2013-11-05 DIAGNOSIS — M48061 Spinal stenosis, lumbar region without neurogenic claudication: Secondary | ICD-10-CM | POA: Diagnosis not present

## 2013-11-18 DIAGNOSIS — M5137 Other intervertebral disc degeneration, lumbosacral region: Secondary | ICD-10-CM | POA: Diagnosis not present

## 2013-12-06 DIAGNOSIS — M5137 Other intervertebral disc degeneration, lumbosacral region: Secondary | ICD-10-CM | POA: Diagnosis not present

## 2013-12-06 DIAGNOSIS — M48061 Spinal stenosis, lumbar region without neurogenic claudication: Secondary | ICD-10-CM | POA: Diagnosis not present

## 2013-12-24 DIAGNOSIS — L259 Unspecified contact dermatitis, unspecified cause: Secondary | ICD-10-CM | POA: Diagnosis not present

## 2014-01-03 DIAGNOSIS — R630 Anorexia: Secondary | ICD-10-CM | POA: Diagnosis not present

## 2014-01-03 DIAGNOSIS — Z125 Encounter for screening for malignant neoplasm of prostate: Secondary | ICD-10-CM | POA: Diagnosis not present

## 2014-01-03 DIAGNOSIS — E559 Vitamin D deficiency, unspecified: Secondary | ICD-10-CM | POA: Diagnosis not present

## 2014-01-03 DIAGNOSIS — Z0000 Encounter for general adult medical examination without abnormal findings: Secondary | ICD-10-CM | POA: Diagnosis not present

## 2014-01-10 DIAGNOSIS — R1310 Dysphagia, unspecified: Secondary | ICD-10-CM | POA: Diagnosis not present

## 2014-01-10 DIAGNOSIS — Z0000 Encounter for general adult medical examination without abnormal findings: Secondary | ICD-10-CM | POA: Diagnosis not present

## 2014-01-10 DIAGNOSIS — Z1331 Encounter for screening for depression: Secondary | ICD-10-CM | POA: Diagnosis not present

## 2014-01-10 DIAGNOSIS — M40299 Other kyphosis, site unspecified: Secondary | ICD-10-CM | POA: Diagnosis not present

## 2014-01-10 DIAGNOSIS — H5440 Blindness, one eye, unspecified eye: Secondary | ICD-10-CM | POA: Diagnosis not present

## 2014-01-10 DIAGNOSIS — Q742 Other congenital malformations of lower limb(s), including pelvic girdle: Secondary | ICD-10-CM | POA: Diagnosis not present

## 2014-01-10 DIAGNOSIS — Z23 Encounter for immunization: Secondary | ICD-10-CM | POA: Diagnosis not present

## 2014-01-10 DIAGNOSIS — M5137 Other intervertebral disc degeneration, lumbosacral region: Secondary | ICD-10-CM | POA: Diagnosis not present

## 2014-01-10 DIAGNOSIS — E559 Vitamin D deficiency, unspecified: Secondary | ICD-10-CM | POA: Diagnosis not present

## 2014-01-10 DIAGNOSIS — M810 Age-related osteoporosis without current pathological fracture: Secondary | ICD-10-CM | POA: Diagnosis not present

## 2014-01-12 DIAGNOSIS — M5137 Other intervertebral disc degeneration, lumbosacral region: Secondary | ICD-10-CM | POA: Diagnosis not present

## 2014-01-12 DIAGNOSIS — Z1212 Encounter for screening for malignant neoplasm of rectum: Secondary | ICD-10-CM | POA: Diagnosis not present

## 2014-01-26 DIAGNOSIS — M5137 Other intervertebral disc degeneration, lumbosacral region: Secondary | ICD-10-CM | POA: Diagnosis not present

## 2014-01-26 DIAGNOSIS — Z79899 Other long term (current) drug therapy: Secondary | ICD-10-CM | POA: Diagnosis not present

## 2014-01-26 DIAGNOSIS — M48061 Spinal stenosis, lumbar region without neurogenic claudication: Secondary | ICD-10-CM | POA: Diagnosis not present

## 2014-01-26 DIAGNOSIS — G894 Chronic pain syndrome: Secondary | ICD-10-CM | POA: Diagnosis not present

## 2014-02-09 DIAGNOSIS — M810 Age-related osteoporosis without current pathological fracture: Secondary | ICD-10-CM | POA: Diagnosis not present

## 2014-02-09 DIAGNOSIS — IMO0002 Reserved for concepts with insufficient information to code with codable children: Secondary | ICD-10-CM | POA: Diagnosis not present

## 2014-02-14 DIAGNOSIS — H4720 Unspecified optic atrophy: Secondary | ICD-10-CM | POA: Diagnosis not present

## 2014-02-14 DIAGNOSIS — Z961 Presence of intraocular lens: Secondary | ICD-10-CM | POA: Diagnosis not present

## 2014-02-14 DIAGNOSIS — H18519 Endothelial corneal dystrophy, unspecified eye: Secondary | ICD-10-CM | POA: Diagnosis not present

## 2014-02-14 DIAGNOSIS — H2510 Age-related nuclear cataract, unspecified eye: Secondary | ICD-10-CM | POA: Diagnosis not present

## 2014-02-16 DIAGNOSIS — M5137 Other intervertebral disc degeneration, lumbosacral region: Secondary | ICD-10-CM | POA: Diagnosis not present

## 2014-03-28 DIAGNOSIS — G4700 Insomnia, unspecified: Secondary | ICD-10-CM | POA: Diagnosis not present

## 2014-03-28 DIAGNOSIS — F329 Major depressive disorder, single episode, unspecified: Secondary | ICD-10-CM | POA: Diagnosis not present

## 2014-03-28 DIAGNOSIS — Z1331 Encounter for screening for depression: Secondary | ICD-10-CM | POA: Diagnosis not present

## 2014-03-28 DIAGNOSIS — F3289 Other specified depressive episodes: Secondary | ICD-10-CM | POA: Diagnosis not present

## 2014-03-28 DIAGNOSIS — IMO0002 Reserved for concepts with insufficient information to code with codable children: Secondary | ICD-10-CM | POA: Diagnosis not present

## 2014-04-15 DIAGNOSIS — M5137 Other intervertebral disc degeneration, lumbosacral region: Secondary | ICD-10-CM | POA: Diagnosis not present

## 2014-04-15 DIAGNOSIS — Z79899 Other long term (current) drug therapy: Secondary | ICD-10-CM | POA: Diagnosis not present

## 2014-04-15 DIAGNOSIS — G894 Chronic pain syndrome: Secondary | ICD-10-CM | POA: Diagnosis not present

## 2014-05-06 DIAGNOSIS — G4700 Insomnia, unspecified: Secondary | ICD-10-CM | POA: Diagnosis not present

## 2014-05-06 DIAGNOSIS — F3289 Other specified depressive episodes: Secondary | ICD-10-CM | POA: Diagnosis not present

## 2014-05-06 DIAGNOSIS — F329 Major depressive disorder, single episode, unspecified: Secondary | ICD-10-CM | POA: Diagnosis not present

## 2014-05-06 DIAGNOSIS — IMO0002 Reserved for concepts with insufficient information to code with codable children: Secondary | ICD-10-CM | POA: Diagnosis not present

## 2014-05-19 DIAGNOSIS — L989 Disorder of the skin and subcutaneous tissue, unspecified: Secondary | ICD-10-CM | POA: Diagnosis not present

## 2014-05-26 DIAGNOSIS — L259 Unspecified contact dermatitis, unspecified cause: Secondary | ICD-10-CM | POA: Diagnosis not present

## 2014-05-30 DIAGNOSIS — K031 Abrasion of teeth: Secondary | ICD-10-CM | POA: Diagnosis not present

## 2014-06-01 DIAGNOSIS — R21 Rash and other nonspecific skin eruption: Secondary | ICD-10-CM | POA: Diagnosis not present

## 2014-06-01 DIAGNOSIS — L03119 Cellulitis of unspecified part of limb: Secondary | ICD-10-CM | POA: Diagnosis not present

## 2014-06-01 DIAGNOSIS — L02419 Cutaneous abscess of limb, unspecified: Secondary | ICD-10-CM | POA: Diagnosis not present

## 2014-06-03 DIAGNOSIS — L02419 Cutaneous abscess of limb, unspecified: Secondary | ICD-10-CM | POA: Diagnosis not present

## 2014-06-04 DIAGNOSIS — L03119 Cellulitis of unspecified part of limb: Secondary | ICD-10-CM | POA: Diagnosis not present

## 2014-06-04 DIAGNOSIS — L02419 Cutaneous abscess of limb, unspecified: Secondary | ICD-10-CM | POA: Diagnosis not present

## 2014-06-06 DIAGNOSIS — L219 Seborrheic dermatitis, unspecified: Secondary | ICD-10-CM | POA: Diagnosis not present

## 2014-06-06 DIAGNOSIS — R21 Rash and other nonspecific skin eruption: Secondary | ICD-10-CM | POA: Diagnosis not present

## 2014-06-07 DIAGNOSIS — L97809 Non-pressure chronic ulcer of other part of unspecified lower leg with unspecified severity: Secondary | ICD-10-CM | POA: Diagnosis not present

## 2014-06-07 DIAGNOSIS — Z792 Long term (current) use of antibiotics: Secondary | ICD-10-CM | POA: Diagnosis not present

## 2014-06-07 DIAGNOSIS — L03119 Cellulitis of unspecified part of limb: Secondary | ICD-10-CM | POA: Diagnosis not present

## 2014-06-07 DIAGNOSIS — Z8546 Personal history of malignant neoplasm of prostate: Secondary | ICD-10-CM | POA: Diagnosis not present

## 2014-06-07 DIAGNOSIS — L02419 Cutaneous abscess of limb, unspecified: Secondary | ICD-10-CM | POA: Diagnosis not present

## 2014-06-07 DIAGNOSIS — Z4800 Encounter for change or removal of nonsurgical wound dressing: Secondary | ICD-10-CM | POA: Diagnosis not present

## 2014-06-08 DIAGNOSIS — Z8546 Personal history of malignant neoplasm of prostate: Secondary | ICD-10-CM | POA: Diagnosis not present

## 2014-06-08 DIAGNOSIS — L03119 Cellulitis of unspecified part of limb: Secondary | ICD-10-CM | POA: Diagnosis not present

## 2014-06-08 DIAGNOSIS — Z792 Long term (current) use of antibiotics: Secondary | ICD-10-CM | POA: Diagnosis not present

## 2014-06-08 DIAGNOSIS — L02419 Cutaneous abscess of limb, unspecified: Secondary | ICD-10-CM | POA: Diagnosis not present

## 2014-06-08 DIAGNOSIS — Z4800 Encounter for change or removal of nonsurgical wound dressing: Secondary | ICD-10-CM | POA: Diagnosis not present

## 2014-06-08 DIAGNOSIS — L97809 Non-pressure chronic ulcer of other part of unspecified lower leg with unspecified severity: Secondary | ICD-10-CM | POA: Diagnosis not present

## 2014-06-09 DIAGNOSIS — L02419 Cutaneous abscess of limb, unspecified: Secondary | ICD-10-CM | POA: Diagnosis not present

## 2014-06-09 DIAGNOSIS — Z4800 Encounter for change or removal of nonsurgical wound dressing: Secondary | ICD-10-CM | POA: Diagnosis not present

## 2014-06-09 DIAGNOSIS — L97809 Non-pressure chronic ulcer of other part of unspecified lower leg with unspecified severity: Secondary | ICD-10-CM | POA: Diagnosis not present

## 2014-06-09 DIAGNOSIS — Z8546 Personal history of malignant neoplasm of prostate: Secondary | ICD-10-CM | POA: Diagnosis not present

## 2014-06-09 DIAGNOSIS — Z792 Long term (current) use of antibiotics: Secondary | ICD-10-CM | POA: Diagnosis not present

## 2014-06-10 DIAGNOSIS — Z8546 Personal history of malignant neoplasm of prostate: Secondary | ICD-10-CM | POA: Diagnosis not present

## 2014-06-10 DIAGNOSIS — Z4800 Encounter for change or removal of nonsurgical wound dressing: Secondary | ICD-10-CM | POA: Diagnosis not present

## 2014-06-10 DIAGNOSIS — L02419 Cutaneous abscess of limb, unspecified: Secondary | ICD-10-CM | POA: Diagnosis not present

## 2014-06-10 DIAGNOSIS — G894 Chronic pain syndrome: Secondary | ICD-10-CM | POA: Diagnosis not present

## 2014-06-10 DIAGNOSIS — L97809 Non-pressure chronic ulcer of other part of unspecified lower leg with unspecified severity: Secondary | ICD-10-CM | POA: Diagnosis not present

## 2014-06-10 DIAGNOSIS — Z79899 Other long term (current) drug therapy: Secondary | ICD-10-CM | POA: Diagnosis not present

## 2014-06-10 DIAGNOSIS — Z792 Long term (current) use of antibiotics: Secondary | ICD-10-CM | POA: Diagnosis not present

## 2014-06-10 DIAGNOSIS — M5137 Other intervertebral disc degeneration, lumbosacral region: Secondary | ICD-10-CM | POA: Diagnosis not present

## 2014-06-11 DIAGNOSIS — L02419 Cutaneous abscess of limb, unspecified: Secondary | ICD-10-CM | POA: Diagnosis not present

## 2014-06-11 DIAGNOSIS — Z8546 Personal history of malignant neoplasm of prostate: Secondary | ICD-10-CM | POA: Diagnosis not present

## 2014-06-11 DIAGNOSIS — Z792 Long term (current) use of antibiotics: Secondary | ICD-10-CM | POA: Diagnosis not present

## 2014-06-11 DIAGNOSIS — L03119 Cellulitis of unspecified part of limb: Secondary | ICD-10-CM | POA: Diagnosis not present

## 2014-06-11 DIAGNOSIS — Z4800 Encounter for change or removal of nonsurgical wound dressing: Secondary | ICD-10-CM | POA: Diagnosis not present

## 2014-06-11 DIAGNOSIS — L97809 Non-pressure chronic ulcer of other part of unspecified lower leg with unspecified severity: Secondary | ICD-10-CM | POA: Diagnosis not present

## 2014-06-12 DIAGNOSIS — L02419 Cutaneous abscess of limb, unspecified: Secondary | ICD-10-CM | POA: Diagnosis not present

## 2014-06-12 DIAGNOSIS — L97809 Non-pressure chronic ulcer of other part of unspecified lower leg with unspecified severity: Secondary | ICD-10-CM | POA: Diagnosis not present

## 2014-06-12 DIAGNOSIS — Z4800 Encounter for change or removal of nonsurgical wound dressing: Secondary | ICD-10-CM | POA: Diagnosis not present

## 2014-06-12 DIAGNOSIS — Z8546 Personal history of malignant neoplasm of prostate: Secondary | ICD-10-CM | POA: Diagnosis not present

## 2014-06-12 DIAGNOSIS — L03119 Cellulitis of unspecified part of limb: Secondary | ICD-10-CM | POA: Diagnosis not present

## 2014-06-12 DIAGNOSIS — Z792 Long term (current) use of antibiotics: Secondary | ICD-10-CM | POA: Diagnosis not present

## 2014-06-13 DIAGNOSIS — L02419 Cutaneous abscess of limb, unspecified: Secondary | ICD-10-CM | POA: Diagnosis not present

## 2014-06-13 DIAGNOSIS — Z792 Long term (current) use of antibiotics: Secondary | ICD-10-CM | POA: Diagnosis not present

## 2014-06-13 DIAGNOSIS — Z8546 Personal history of malignant neoplasm of prostate: Secondary | ICD-10-CM | POA: Diagnosis not present

## 2014-06-13 DIAGNOSIS — Z4800 Encounter for change or removal of nonsurgical wound dressing: Secondary | ICD-10-CM | POA: Diagnosis not present

## 2014-06-13 DIAGNOSIS — L97809 Non-pressure chronic ulcer of other part of unspecified lower leg with unspecified severity: Secondary | ICD-10-CM | POA: Diagnosis not present

## 2014-06-14 DIAGNOSIS — Z8546 Personal history of malignant neoplasm of prostate: Secondary | ICD-10-CM | POA: Diagnosis not present

## 2014-06-14 DIAGNOSIS — Z4800 Encounter for change or removal of nonsurgical wound dressing: Secondary | ICD-10-CM | POA: Diagnosis not present

## 2014-06-14 DIAGNOSIS — L97809 Non-pressure chronic ulcer of other part of unspecified lower leg with unspecified severity: Secondary | ICD-10-CM | POA: Diagnosis not present

## 2014-06-14 DIAGNOSIS — L02419 Cutaneous abscess of limb, unspecified: Secondary | ICD-10-CM | POA: Diagnosis not present

## 2014-06-14 DIAGNOSIS — Z792 Long term (current) use of antibiotics: Secondary | ICD-10-CM | POA: Diagnosis not present

## 2014-06-15 DIAGNOSIS — Z8546 Personal history of malignant neoplasm of prostate: Secondary | ICD-10-CM | POA: Diagnosis not present

## 2014-06-15 DIAGNOSIS — Z4800 Encounter for change or removal of nonsurgical wound dressing: Secondary | ICD-10-CM | POA: Diagnosis not present

## 2014-06-15 DIAGNOSIS — L02419 Cutaneous abscess of limb, unspecified: Secondary | ICD-10-CM | POA: Diagnosis not present

## 2014-06-15 DIAGNOSIS — L97809 Non-pressure chronic ulcer of other part of unspecified lower leg with unspecified severity: Secondary | ICD-10-CM | POA: Diagnosis not present

## 2014-06-15 DIAGNOSIS — Z792 Long term (current) use of antibiotics: Secondary | ICD-10-CM | POA: Diagnosis not present

## 2014-06-15 DIAGNOSIS — L259 Unspecified contact dermatitis, unspecified cause: Secondary | ICD-10-CM | POA: Diagnosis not present

## 2014-06-17 DIAGNOSIS — I872 Venous insufficiency (chronic) (peripheral): Secondary | ICD-10-CM | POA: Diagnosis not present

## 2014-06-17 DIAGNOSIS — L989 Disorder of the skin and subcutaneous tissue, unspecified: Secondary | ICD-10-CM | POA: Diagnosis not present

## 2014-06-17 DIAGNOSIS — IMO0002 Reserved for concepts with insufficient information to code with codable children: Secondary | ICD-10-CM | POA: Diagnosis not present

## 2014-06-20 DIAGNOSIS — L02419 Cutaneous abscess of limb, unspecified: Secondary | ICD-10-CM | POA: Diagnosis not present

## 2014-06-20 DIAGNOSIS — Z792 Long term (current) use of antibiotics: Secondary | ICD-10-CM | POA: Diagnosis not present

## 2014-06-20 DIAGNOSIS — L97809 Non-pressure chronic ulcer of other part of unspecified lower leg with unspecified severity: Secondary | ICD-10-CM | POA: Diagnosis not present

## 2014-06-20 DIAGNOSIS — Z8546 Personal history of malignant neoplasm of prostate: Secondary | ICD-10-CM | POA: Diagnosis not present

## 2014-06-20 DIAGNOSIS — Z4800 Encounter for change or removal of nonsurgical wound dressing: Secondary | ICD-10-CM | POA: Diagnosis not present

## 2014-06-21 DIAGNOSIS — Z4800 Encounter for change or removal of nonsurgical wound dressing: Secondary | ICD-10-CM | POA: Diagnosis not present

## 2014-06-21 DIAGNOSIS — L97809 Non-pressure chronic ulcer of other part of unspecified lower leg with unspecified severity: Secondary | ICD-10-CM | POA: Diagnosis not present

## 2014-06-21 DIAGNOSIS — L02419 Cutaneous abscess of limb, unspecified: Secondary | ICD-10-CM | POA: Diagnosis not present

## 2014-06-21 DIAGNOSIS — Z792 Long term (current) use of antibiotics: Secondary | ICD-10-CM | POA: Diagnosis not present

## 2014-06-21 DIAGNOSIS — Z8546 Personal history of malignant neoplasm of prostate: Secondary | ICD-10-CM | POA: Diagnosis not present

## 2014-06-22 DIAGNOSIS — L97809 Non-pressure chronic ulcer of other part of unspecified lower leg with unspecified severity: Secondary | ICD-10-CM | POA: Diagnosis not present

## 2014-06-22 DIAGNOSIS — Z4800 Encounter for change or removal of nonsurgical wound dressing: Secondary | ICD-10-CM | POA: Diagnosis not present

## 2014-06-22 DIAGNOSIS — L02419 Cutaneous abscess of limb, unspecified: Secondary | ICD-10-CM | POA: Diagnosis not present

## 2014-06-22 DIAGNOSIS — Z792 Long term (current) use of antibiotics: Secondary | ICD-10-CM | POA: Diagnosis not present

## 2014-06-22 DIAGNOSIS — Z8546 Personal history of malignant neoplasm of prostate: Secondary | ICD-10-CM | POA: Diagnosis not present

## 2014-06-23 DIAGNOSIS — L259 Unspecified contact dermatitis, unspecified cause: Secondary | ICD-10-CM | POA: Diagnosis not present

## 2014-06-27 DIAGNOSIS — L97809 Non-pressure chronic ulcer of other part of unspecified lower leg with unspecified severity: Secondary | ICD-10-CM | POA: Diagnosis not present

## 2014-06-27 DIAGNOSIS — Z8546 Personal history of malignant neoplasm of prostate: Secondary | ICD-10-CM | POA: Diagnosis not present

## 2014-06-27 DIAGNOSIS — Z792 Long term (current) use of antibiotics: Secondary | ICD-10-CM | POA: Diagnosis not present

## 2014-06-27 DIAGNOSIS — L02419 Cutaneous abscess of limb, unspecified: Secondary | ICD-10-CM | POA: Diagnosis not present

## 2014-06-27 DIAGNOSIS — Z4800 Encounter for change or removal of nonsurgical wound dressing: Secondary | ICD-10-CM | POA: Diagnosis not present

## 2014-06-28 DIAGNOSIS — Z8546 Personal history of malignant neoplasm of prostate: Secondary | ICD-10-CM | POA: Diagnosis not present

## 2014-06-28 DIAGNOSIS — L97809 Non-pressure chronic ulcer of other part of unspecified lower leg with unspecified severity: Secondary | ICD-10-CM | POA: Diagnosis not present

## 2014-06-28 DIAGNOSIS — Z792 Long term (current) use of antibiotics: Secondary | ICD-10-CM | POA: Diagnosis not present

## 2014-06-28 DIAGNOSIS — L02419 Cutaneous abscess of limb, unspecified: Secondary | ICD-10-CM | POA: Diagnosis not present

## 2014-06-28 DIAGNOSIS — Z4800 Encounter for change or removal of nonsurgical wound dressing: Secondary | ICD-10-CM | POA: Diagnosis not present

## 2014-06-29 DIAGNOSIS — L03119 Cellulitis of unspecified part of limb: Secondary | ICD-10-CM | POA: Diagnosis not present

## 2014-06-29 DIAGNOSIS — Z792 Long term (current) use of antibiotics: Secondary | ICD-10-CM | POA: Diagnosis not present

## 2014-06-29 DIAGNOSIS — Z8546 Personal history of malignant neoplasm of prostate: Secondary | ICD-10-CM | POA: Diagnosis not present

## 2014-06-29 DIAGNOSIS — L97809 Non-pressure chronic ulcer of other part of unspecified lower leg with unspecified severity: Secondary | ICD-10-CM | POA: Diagnosis not present

## 2014-06-29 DIAGNOSIS — L02419 Cutaneous abscess of limb, unspecified: Secondary | ICD-10-CM | POA: Diagnosis not present

## 2014-06-29 DIAGNOSIS — Z4800 Encounter for change or removal of nonsurgical wound dressing: Secondary | ICD-10-CM | POA: Diagnosis not present

## 2014-07-04 DIAGNOSIS — L97809 Non-pressure chronic ulcer of other part of unspecified lower leg with unspecified severity: Secondary | ICD-10-CM | POA: Diagnosis not present

## 2014-07-04 DIAGNOSIS — Z4800 Encounter for change or removal of nonsurgical wound dressing: Secondary | ICD-10-CM | POA: Diagnosis not present

## 2014-07-04 DIAGNOSIS — L02419 Cutaneous abscess of limb, unspecified: Secondary | ICD-10-CM | POA: Diagnosis not present

## 2014-07-04 DIAGNOSIS — Z792 Long term (current) use of antibiotics: Secondary | ICD-10-CM | POA: Diagnosis not present

## 2014-07-04 DIAGNOSIS — Z8546 Personal history of malignant neoplasm of prostate: Secondary | ICD-10-CM | POA: Diagnosis not present

## 2014-07-06 DIAGNOSIS — Z792 Long term (current) use of antibiotics: Secondary | ICD-10-CM | POA: Diagnosis not present

## 2014-07-06 DIAGNOSIS — L03119 Cellulitis of unspecified part of limb: Secondary | ICD-10-CM | POA: Diagnosis not present

## 2014-07-06 DIAGNOSIS — Z8546 Personal history of malignant neoplasm of prostate: Secondary | ICD-10-CM | POA: Diagnosis not present

## 2014-07-06 DIAGNOSIS — L02419 Cutaneous abscess of limb, unspecified: Secondary | ICD-10-CM | POA: Diagnosis not present

## 2014-07-06 DIAGNOSIS — Z4800 Encounter for change or removal of nonsurgical wound dressing: Secondary | ICD-10-CM | POA: Diagnosis not present

## 2014-07-06 DIAGNOSIS — L97809 Non-pressure chronic ulcer of other part of unspecified lower leg with unspecified severity: Secondary | ICD-10-CM | POA: Diagnosis not present

## 2014-07-07 DIAGNOSIS — L259 Unspecified contact dermatitis, unspecified cause: Secondary | ICD-10-CM | POA: Diagnosis not present

## 2014-07-08 DIAGNOSIS — Z4800 Encounter for change or removal of nonsurgical wound dressing: Secondary | ICD-10-CM | POA: Diagnosis not present

## 2014-07-08 DIAGNOSIS — Z8546 Personal history of malignant neoplasm of prostate: Secondary | ICD-10-CM | POA: Diagnosis not present

## 2014-07-08 DIAGNOSIS — Z792 Long term (current) use of antibiotics: Secondary | ICD-10-CM | POA: Diagnosis not present

## 2014-07-08 DIAGNOSIS — L97809 Non-pressure chronic ulcer of other part of unspecified lower leg with unspecified severity: Secondary | ICD-10-CM | POA: Diagnosis not present

## 2014-07-08 DIAGNOSIS — L02419 Cutaneous abscess of limb, unspecified: Secondary | ICD-10-CM | POA: Diagnosis not present

## 2014-07-09 ENCOUNTER — Encounter (HOSPITAL_COMMUNITY): Payer: Self-pay | Admitting: Emergency Medicine

## 2014-07-09 ENCOUNTER — Emergency Department (HOSPITAL_COMMUNITY)
Admission: EM | Admit: 2014-07-09 | Discharge: 2014-07-09 | Disposition: A | Discharge status: Home / Self Care | Payer: Medicare Other | Attending: Emergency Medicine | Admitting: Emergency Medicine

## 2014-07-09 DIAGNOSIS — G2581 Restless legs syndrome: Secondary | ICD-10-CM | POA: Diagnosis not present

## 2014-07-09 DIAGNOSIS — L03116 Cellulitis of left lower limb: Secondary | ICD-10-CM

## 2014-07-09 DIAGNOSIS — J45909 Unspecified asthma, uncomplicated: Secondary | ICD-10-CM | POA: Diagnosis not present

## 2014-07-09 DIAGNOSIS — N189 Chronic kidney disease, unspecified: Secondary | ICD-10-CM | POA: Insufficient documentation

## 2014-07-09 DIAGNOSIS — L02419 Cutaneous abscess of limb, unspecified: Secondary | ICD-10-CM

## 2014-07-09 DIAGNOSIS — H5440 Blindness, one eye, unspecified eye: Secondary | ICD-10-CM | POA: Insufficient documentation

## 2014-07-09 DIAGNOSIS — Z79899 Other long term (current) drug therapy: Secondary | ICD-10-CM | POA: Diagnosis not present

## 2014-07-09 DIAGNOSIS — Z8546 Personal history of malignant neoplasm of prostate: Secondary | ICD-10-CM | POA: Insufficient documentation

## 2014-07-09 DIAGNOSIS — M79609 Pain in unspecified limb: Secondary | ICD-10-CM | POA: Diagnosis not present

## 2014-07-09 DIAGNOSIS — M7989 Other specified soft tissue disorders: Secondary | ICD-10-CM | POA: Insufficient documentation

## 2014-07-09 DIAGNOSIS — Z792 Long term (current) use of antibiotics: Secondary | ICD-10-CM | POA: Diagnosis not present

## 2014-07-09 DIAGNOSIS — L03119 Cellulitis of unspecified part of limb: Principal | ICD-10-CM

## 2014-07-09 LAB — CBC WITH DIFFERENTIAL/PLATELET
Basophils Absolute: 0 10*3/uL (ref 0.0–0.1)
Basophils Relative: 0 % (ref 0–1)
Eosinophils Absolute: 0.4 10*3/uL (ref 0.0–0.7)
Eosinophils Relative: 4 % (ref 0–5)
HCT: 44.6 % (ref 39.0–52.0)
HEMOGLOBIN: 14.6 g/dL (ref 13.0–17.0)
LYMPHS ABS: 2.3 10*3/uL (ref 0.7–4.0)
LYMPHS PCT: 25 % (ref 12–46)
MCH: 29.6 pg (ref 26.0–34.0)
MCHC: 32.7 g/dL (ref 30.0–36.0)
MCV: 90.3 fL (ref 78.0–100.0)
MONOS PCT: 11 % (ref 3–12)
Monocytes Absolute: 1 10*3/uL (ref 0.1–1.0)
NEUTROS ABS: 5.4 10*3/uL (ref 1.7–7.7)
NEUTROS PCT: 60 % (ref 43–77)
Platelets: 212 10*3/uL (ref 150–400)
RBC: 4.94 MIL/uL (ref 4.22–5.81)
RDW: 12.9 % (ref 11.5–15.5)
WBC: 9.1 10*3/uL (ref 4.0–10.5)

## 2014-07-09 LAB — BASIC METABOLIC PANEL
Anion gap: 10 (ref 5–15)
BUN: 15 mg/dL (ref 6–23)
CHLORIDE: 102 meq/L (ref 96–112)
CO2: 27 mEq/L (ref 19–32)
Calcium: 9.4 mg/dL (ref 8.4–10.5)
Creatinine, Ser: 0.89 mg/dL (ref 0.50–1.35)
GFR calc Af Amer: 88 mL/min — ABNORMAL LOW (ref >90)
GFR calc non Af Amer: 76 mL/min — ABNORMAL LOW (ref >90)
GLUCOSE: 100 mg/dL — AB (ref 70–99)
POTASSIUM: 4.5 meq/L (ref 3.7–5.3)
Sodium: 139 mEq/L (ref 137–147)

## 2014-07-09 LAB — SEDIMENTATION RATE: Sed Rate: 8 mm/hr (ref 0–16)

## 2014-07-09 MED ORDER — SODIUM CHLORIDE 0.9 % IV SOLN
INTRAVENOUS | Status: DC
  Administered 2014-07-09: 18:00:00 via INTRAVENOUS

## 2014-07-09 MED ORDER — CLINDAMYCIN HCL 300 MG PO CAPS: 300.0000 mg | ORAL_CAPSULE | Freq: Four times a day (QID) | ORAL | Status: DC

## 2014-07-09 MED ORDER — VANCOMYCIN HCL IN DEXTROSE 1-5 GM/200ML-% IV SOLN
1000.0000 mg | Freq: Once | INTRAVENOUS | Status: AC
  Administered 2014-07-09: 1000 mg via INTRAVENOUS
  Filled 2014-07-09: qty 200

## 2014-07-09 NOTE — ED Notes (Signed)
Pt sent here form [facility] for bilat lower extremity edema and worsening cellulitis despite being on oral antibiotic.  Pt states that his leg have been swollen since Wed.  Pt's family Pt has chronic skin problems that pt has been treated for for couple months.

## 2014-07-09 NOTE — ED Provider Notes (Signed)
CSN: [id number]     Arrival date & time 07/09/14  1620 History   First MD Initiated Contact with Patient 07/09/14 1650     Chief Complaint  Patient presents with  . Leg Swelling    bilat     (Consider location/radiation/quality/duration/timing/severity/associated sxs/prior Treatment) The history is provided by the patient.    Race Casey Gates is a 80 y.o. male who states that his left leg is swollen and painful for the last day and a half. This was preceded by an infection of his right leg. That was treated with doxycycline. He remains on the doxycycline. He has had intermittent infections of his legs, for the last 2 months. He also developed a rash on his left lower leg, for which he saw a dermatologist yesterday. He was prescribed an unknown "sauve". He denies fever, chills, nausea, vomiting, cough, shortness of breath, or chest pain. He was evaluated today in the walk-in clinic, separate from his primary care clinic, and sent here for further evaluation. There are no other known modifying factors.  Past Medical History  Diagnosis Date  . Blind left eye   . Restless leg   . Asthma     AS A CHILD  . Chronic kidney disease   . Cancer     PROSTATE CANCER   Past Surgical History  Procedure Laterality Date  . Prostatectomy    . Rigth nephrectomy  2006  . Right cataract extraction  2013  . Tonsillectomy     Family History  Problem Relation Age of Onset  . Bone cancer Mother    History  Substance Use Topics  . Smoking status: Never Smoker   . Smokeless tobacco: Never Used  . Alcohol Use: No     Comment: QUIT ALCOHOL AT AGE 39    Review of Systems  All other systems reviewed and are negative.     Allergies  Peanuts and Coconut oil  Home Medications   Prior to Admission medications   Medication Sig Start Date End Date Taking? Authorizing Provider  doxycycline (VIBRA-TABS) 100 MG tablet Take 100 mg by mouth 2 (two) times daily.   Yes Historical Provider, MD  mupirocin  ointment (BACTROBAN) 2 % Place 1 application into the nose 3 (three) times daily.   Yes Historical Provider, MD  oxymorphone (OPANA ER) 40 MG 12 hr tablet Take 40 mg by mouth every 12 (twelve) hours.   Yes Historical Provider, MD  polyethylene glycol (MIRALAX / GLYCOLAX) packet Take 17 g by mouth 2 (two) times daily as needed. For constipation   Yes Historical Provider, MD  pregabalin (LYRICA) 75 MG capsule Take 75 mg by mouth 2 (two) times daily.   Yes Historical Provider, MD  zolpidem (AMBIEN) 10 MG tablet Take 10 mg by mouth at bedtime as needed for sleep.   Yes Historical Provider, MD  clindamycin (CLEOCIN) 300 MG capsule Take 1 capsule (300 mg total) by mouth 4 (four) times daily. X 7 days 07/09/14   Richarda Blade, MD   BP 135/73  Pulse 86  Temp(Src) 97.9 F (36.6 C) (Oral)  Resp 16  SpO2 95% Physical Exam  Nursing note and vitals reviewed. Constitutional: He is oriented to person, place, and time. He appears well-developed.  Elderly, frail  HENT:  Head: Normocephalic and atraumatic.  Right Ear: External ear normal.  Left Ear: External ear normal.  Eyes: Conjunctivae and EOM are normal. Pupils are equal, round, and reactive to light.  Neck: Normal range of motion and  phonation normal. Neck supple.  Cardiovascular: Normal rate, regular rhythm, normal heart sounds and intact distal pulses.   Pulmonary/Chest: Effort normal and breath sounds normal. No respiratory distress. He has no wheezes. He has no rales. He exhibits no bony tenderness.  Abdominal: Soft. There is no tenderness.  Musculoskeletal: Normal range of motion.  Right lower extremity is mildly injected, and has trace peripheral edema. Left lower extremity is markedly injected, from the knee down, and has 2-3+ edema. There is no calf tenderness. Homans sign is negative. The erythema extends posteriorly above the knee about 10 cm. There is no popliteal mass or tenderness. There is no left groin mass, adenopathy, or tenderness.  There is normal perfusion in the toes of both feet, bilaterally.  Neurological: He is alert and oriented to person, place, and time. No cranial nerve deficit or sensory deficit. He exhibits normal muscle tone. Coordination normal.  Skin: Skin is warm, dry and intact.  Patchs of raised, irregular, scaly lesions left leg, x2.  Psychiatric: He has a normal mood and affect. His behavior is normal. Judgment and thought content normal.    ED Course  Procedures (including critical care time)  The initial evaluation is consistent with cellulitis of the left lower leg. Etiology may be the new rash that has been on his left leg for 5 days. There is a low-level risk for a DVT of the left leg. He has never had a DVT. He will be treated for infection with vancomycin, and evaluated for a DVT. There is no evidence of congestive heart failure, pneumonia or symptoms consistent with ACS.  Medications  0.9 %  sodium chloride infusion ( Intravenous Stopped 07/09/14 2022)  vancomycin (VANCOCIN) IVPB 1000 mg/200 mL premix (0 mg Intravenous Stopped 07/09/14 1921)   Venous Doppler negative for DVT, left leg Patient Vitals for the past 24 hrs:  BP Temp Temp src Pulse Resp SpO2  07/09/14 2149 135/73 mmHg - - 86 16 95 %  07/09/14 1854 133/69 mmHg 97.9 F (36.6 C) Oral 78 16 94 %  07/09/14 1629 142/70 mmHg 98.4 F (36.9 C) Oral 76 20 98 %   At discharge- Reevaluation with update and discussion. After initial assessment and treatment, an updated evaluation reveals no additional complaints. Findings discussed with son and patient, all questions answered. Gilman Olazabal L   Labs Review Labs Reviewed  BASIC METABOLIC PANEL - Abnormal; Notable for the following:    Glucose, Bld 100 (*)    GFR calc non Af Amer 76 (*)    GFR calc Af Amer 88 (*)    All other components within normal limits  CBC WITH DIFFERENTIAL  SEDIMENTATION RATE    Imaging Review No results found.   EKG Interpretation None      MDM   Final  diagnoses:  Cellulitis of left leg    Evaluation is consistent with cellulitis, left leg, likely source is skin breakdown from rash on the left lower leg. Doubt sepsis, metabolic instability, or impending vascular collapse.  Nursing Notes Reviewed/ Care Coordinated Applicable Imaging Reviewed Interpretation of Laboratory Data incorporated into ED treatment  The patient appears reasonably screened and/or stabilized for discharge and I doubt any other medical condition or other [facility] requiring further screening, evaluation, or treatment in the ED at this time prior to discharge.  Plan: Home Medications- Clindamycin; Home Treatments- rest, strict leg elevation; return here if the recommended treatment, does not improve the symptoms; Recommended follow up- PCP 1 week and prn  Richarda Blade, MD 07/09/14 2220

## 2014-07-09 NOTE — Progress Notes (Signed)
VASCULAR LAB PRELIMINARY  PRELIMINARY  PRELIMINARY  PRELIMINARY  Left lower extremity venous Doppler completed.    Preliminary report:  There is no DVT or SVT noted in the left lower extremity.   Malaina Mortellaro, RVT 07/09/2014, 6:44 PM

## 2014-07-09 NOTE — Discharge Instructions (Signed)
Keep your feet, elevated above your heart, as much as possible. See your doctor if worsening or not better in 7-10 days. Return here if needed for problems.    Cellulitis Cellulitis is an infection of the skin and the tissue beneath it. The infected area is usually red and tender. Cellulitis occurs most often in the arms and lower legs.  CAUSES  Cellulitis is caused by bacteria that enter the skin through cracks or cuts in the skin. The most common types of bacteria that cause cellulitis are staphylococci and streptococci. SIGNS AND SYMPTOMS   Redness and warmth.  Swelling.  Tenderness or pain.  Fever. DIAGNOSIS  Your health care provider can usually determine what is wrong based on a physical exam. Blood tests may also be done. TREATMENT  Treatment usually involves taking an antibiotic medicine. HOME CARE INSTRUCTIONS   Take your antibiotic medicine as directed by your health care provider. Finish the antibiotic even if you start to feel better.  Keep the infected arm or leg elevated to reduce swelling.  Apply a warm cloth to the affected area up to 4 times per day to relieve pain.  Take medicines only as directed by your health care provider.  Keep all follow-up visits as directed by your health care provider. SEEK MEDICAL CARE IF:   You notice red streaks coming from the infected area.  Your red area gets larger or turns dark in color.  Your bone or joint underneath the infected area becomes painful after the skin has healed.  Your infection returns in the same area or another area.  You notice a swollen bump in the infected area.  You develop new symptoms.  You have a fever. SEEK IMMEDIATE MEDICAL CARE IF:   You feel very sleepy.  You develop vomiting or diarrhea.  You have a general ill feeling (malaise) with muscle aches and pains. MAKE SURE YOU:   Understand these instructions.  Will watch your condition.  Will get help right away if you are not  doing well or get worse. Document Released: 08/28/2005 Document Revised: 04/04/2014 Document Reviewed: 02/03/2012 [facility] Patient Information 2015 [place], [place]. This information is not intended to replace advice given to you by your health care provider. Make sure you discuss any questions you have with your health care provider.  Cellulitis Cellulitis is an infection of the skin and the tissue beneath it. The infected area is usually red and tender. Cellulitis occurs most often in the arms and lower legs.  CAUSES  Cellulitis is caused by bacteria that enter the skin through cracks or cuts in the skin. The most common types of bacteria that cause cellulitis are staphylococci and streptococci. SIGNS AND SYMPTOMS   Redness and warmth.  Swelling.  Tenderness or pain.  Fever. DIAGNOSIS  Your health care provider can usually determine what is wrong based on a physical exam. Blood tests may also be done. TREATMENT  Treatment usually involves taking an antibiotic medicine. HOME CARE INSTRUCTIONS   Take your antibiotic medicine as directed by your health care provider. Finish the antibiotic even if you start to feel better.  Keep the infected arm or leg elevated to reduce swelling.  Apply a warm cloth to the affected area up to 4 times per day to relieve pain.  Take medicines only as directed by your health care provider.  Keep all follow-up visits as directed by your health care provider. SEEK MEDICAL CARE IF:   You notice red streaks coming from the infected  area.  Your red area gets larger or turns dark in color.  Your bone or joint underneath the infected area becomes painful after the skin has healed.  Your infection returns in the same area or another area.  You notice a swollen bump in the infected area.  You develop new symptoms.  You have a fever. SEEK IMMEDIATE MEDICAL CARE IF:   You feel very sleepy.  You develop vomiting or diarrhea.  You have a general  ill feeling (malaise) with muscle aches and pains. MAKE SURE YOU:   Understand these instructions.  Will watch your condition.  Will get help right away if you are not doing well or get worse. Document Released: 08/28/2005 Document Revised: 04/04/2014 Document Reviewed: 02/03/2012 [facility] Patient Information 2015 [place], [place]. This information is not intended to replace advice given to you by your health care provider. Make sure you discuss any questions you have with your health care provider.

## 2014-07-11 DIAGNOSIS — Z792 Long term (current) use of antibiotics: Secondary | ICD-10-CM | POA: Diagnosis not present

## 2014-07-11 DIAGNOSIS — Z4800 Encounter for change or removal of nonsurgical wound dressing: Secondary | ICD-10-CM | POA: Diagnosis not present

## 2014-07-11 DIAGNOSIS — L02419 Cutaneous abscess of limb, unspecified: Secondary | ICD-10-CM | POA: Diagnosis not present

## 2014-07-11 DIAGNOSIS — L259 Unspecified contact dermatitis, unspecified cause: Secondary | ICD-10-CM | POA: Diagnosis not present

## 2014-07-11 DIAGNOSIS — Z8546 Personal history of malignant neoplasm of prostate: Secondary | ICD-10-CM | POA: Diagnosis not present

## 2014-07-11 DIAGNOSIS — L97809 Non-pressure chronic ulcer of other part of unspecified lower leg with unspecified severity: Secondary | ICD-10-CM | POA: Diagnosis not present

## 2014-07-11 DIAGNOSIS — IMO0002 Reserved for concepts with insufficient information to code with codable children: Secondary | ICD-10-CM | POA: Diagnosis not present

## 2014-07-11 DIAGNOSIS — L03119 Cellulitis of unspecified part of limb: Secondary | ICD-10-CM | POA: Diagnosis not present

## 2014-07-13 DIAGNOSIS — Z4800 Encounter for change or removal of nonsurgical wound dressing: Secondary | ICD-10-CM | POA: Diagnosis not present

## 2014-07-13 DIAGNOSIS — L02419 Cutaneous abscess of limb, unspecified: Secondary | ICD-10-CM | POA: Diagnosis not present

## 2014-07-13 DIAGNOSIS — L97809 Non-pressure chronic ulcer of other part of unspecified lower leg with unspecified severity: Secondary | ICD-10-CM | POA: Diagnosis not present

## 2014-07-13 DIAGNOSIS — Z8546 Personal history of malignant neoplasm of prostate: Secondary | ICD-10-CM | POA: Diagnosis not present

## 2014-07-13 DIAGNOSIS — Z792 Long term (current) use of antibiotics: Secondary | ICD-10-CM | POA: Diagnosis not present

## 2014-07-14 ENCOUNTER — Ambulatory Visit (HOSPITAL_COMMUNITY)
Admission: RE | Admit: 2014-07-14 | Discharge: 2014-07-14 | Disposition: A | Discharge status: Home / Self Care | Payer: Medicare Other | Source: Ambulatory Visit | Attending: Vascular Surgery | Admitting: Vascular Surgery

## 2014-07-14 ENCOUNTER — Other Ambulatory Visit (HOSPITAL_COMMUNITY): Payer: Self-pay | Admitting: Internal Medicine

## 2014-07-14 DIAGNOSIS — Y929 Unspecified place or not applicable: Secondary | ICD-10-CM | POA: Insufficient documentation

## 2014-07-14 DIAGNOSIS — L259 Unspecified contact dermatitis, unspecified cause: Secondary | ICD-10-CM | POA: Diagnosis not present

## 2014-07-14 DIAGNOSIS — S81009A Unspecified open wound, unspecified knee, initial encounter: Secondary | ICD-10-CM | POA: Diagnosis not present

## 2014-07-14 DIAGNOSIS — S91009A Unspecified open wound, unspecified ankle, initial encounter: Principal | ICD-10-CM

## 2014-07-14 DIAGNOSIS — S81809A Unspecified open wound, unspecified lower leg, initial encounter: Principal | ICD-10-CM

## 2014-07-14 DIAGNOSIS — X58XXXA Exposure to other specified factors, initial encounter: Secondary | ICD-10-CM | POA: Insufficient documentation

## 2014-07-14 DIAGNOSIS — Y999 Unspecified external cause status: Secondary | ICD-10-CM | POA: Insufficient documentation

## 2014-07-15 DIAGNOSIS — R197 Diarrhea, unspecified: Secondary | ICD-10-CM | POA: Diagnosis not present

## 2014-07-15 DIAGNOSIS — L259 Unspecified contact dermatitis, unspecified cause: Secondary | ICD-10-CM | POA: Diagnosis not present

## 2014-07-15 DIAGNOSIS — Z681 Body mass index (BMI) 19 or less, adult: Secondary | ICD-10-CM | POA: Diagnosis not present

## 2014-07-18 DIAGNOSIS — Z792 Long term (current) use of antibiotics: Secondary | ICD-10-CM | POA: Diagnosis not present

## 2014-07-18 DIAGNOSIS — Z4800 Encounter for change or removal of nonsurgical wound dressing: Secondary | ICD-10-CM | POA: Diagnosis not present

## 2014-07-18 DIAGNOSIS — Z8546 Personal history of malignant neoplasm of prostate: Secondary | ICD-10-CM | POA: Diagnosis not present

## 2014-07-18 DIAGNOSIS — L02419 Cutaneous abscess of limb, unspecified: Secondary | ICD-10-CM | POA: Diagnosis not present

## 2014-07-18 DIAGNOSIS — L97809 Non-pressure chronic ulcer of other part of unspecified lower leg with unspecified severity: Secondary | ICD-10-CM | POA: Diagnosis not present

## 2014-07-19 DIAGNOSIS — Z792 Long term (current) use of antibiotics: Secondary | ICD-10-CM | POA: Diagnosis not present

## 2014-07-19 DIAGNOSIS — Z8546 Personal history of malignant neoplasm of prostate: Secondary | ICD-10-CM | POA: Diagnosis not present

## 2014-07-19 DIAGNOSIS — L02419 Cutaneous abscess of limb, unspecified: Secondary | ICD-10-CM | POA: Diagnosis not present

## 2014-07-19 DIAGNOSIS — L97809 Non-pressure chronic ulcer of other part of unspecified lower leg with unspecified severity: Secondary | ICD-10-CM | POA: Diagnosis not present

## 2014-07-19 DIAGNOSIS — Z4800 Encounter for change or removal of nonsurgical wound dressing: Secondary | ICD-10-CM | POA: Diagnosis not present

## 2014-07-20 DIAGNOSIS — L02419 Cutaneous abscess of limb, unspecified: Secondary | ICD-10-CM | POA: Diagnosis not present

## 2014-07-20 DIAGNOSIS — L97809 Non-pressure chronic ulcer of other part of unspecified lower leg with unspecified severity: Secondary | ICD-10-CM | POA: Diagnosis not present

## 2014-07-20 DIAGNOSIS — Z4800 Encounter for change or removal of nonsurgical wound dressing: Secondary | ICD-10-CM | POA: Diagnosis not present

## 2014-07-20 DIAGNOSIS — Z792 Long term (current) use of antibiotics: Secondary | ICD-10-CM | POA: Diagnosis not present

## 2014-07-20 DIAGNOSIS — Z8546 Personal history of malignant neoplasm of prostate: Secondary | ICD-10-CM | POA: Diagnosis not present

## 2014-07-20 DIAGNOSIS — L03119 Cellulitis of unspecified part of limb: Secondary | ICD-10-CM | POA: Diagnosis not present

## 2014-07-21 DIAGNOSIS — L03119 Cellulitis of unspecified part of limb: Secondary | ICD-10-CM | POA: Diagnosis not present

## 2014-07-21 DIAGNOSIS — L02419 Cutaneous abscess of limb, unspecified: Secondary | ICD-10-CM | POA: Diagnosis not present

## 2014-07-21 DIAGNOSIS — L97809 Non-pressure chronic ulcer of other part of unspecified lower leg with unspecified severity: Secondary | ICD-10-CM | POA: Diagnosis not present

## 2014-07-21 DIAGNOSIS — Z792 Long term (current) use of antibiotics: Secondary | ICD-10-CM | POA: Diagnosis not present

## 2014-07-21 DIAGNOSIS — Z4800 Encounter for change or removal of nonsurgical wound dressing: Secondary | ICD-10-CM | POA: Diagnosis not present

## 2014-07-21 DIAGNOSIS — Z8546 Personal history of malignant neoplasm of prostate: Secondary | ICD-10-CM | POA: Diagnosis not present

## 2014-07-22 DIAGNOSIS — Z8546 Personal history of malignant neoplasm of prostate: Secondary | ICD-10-CM | POA: Diagnosis not present

## 2014-07-22 DIAGNOSIS — L03119 Cellulitis of unspecified part of limb: Secondary | ICD-10-CM | POA: Diagnosis not present

## 2014-07-22 DIAGNOSIS — L02419 Cutaneous abscess of limb, unspecified: Secondary | ICD-10-CM | POA: Diagnosis not present

## 2014-07-22 DIAGNOSIS — L97809 Non-pressure chronic ulcer of other part of unspecified lower leg with unspecified severity: Secondary | ICD-10-CM | POA: Diagnosis not present

## 2014-07-22 DIAGNOSIS — Z792 Long term (current) use of antibiotics: Secondary | ICD-10-CM | POA: Diagnosis not present

## 2014-07-22 DIAGNOSIS — Z4800 Encounter for change or removal of nonsurgical wound dressing: Secondary | ICD-10-CM | POA: Diagnosis not present

## 2014-07-25 DIAGNOSIS — Z792 Long term (current) use of antibiotics: Secondary | ICD-10-CM | POA: Diagnosis not present

## 2014-07-25 DIAGNOSIS — L97809 Non-pressure chronic ulcer of other part of unspecified lower leg with unspecified severity: Secondary | ICD-10-CM | POA: Diagnosis not present

## 2014-07-25 DIAGNOSIS — L02419 Cutaneous abscess of limb, unspecified: Secondary | ICD-10-CM | POA: Diagnosis not present

## 2014-07-25 DIAGNOSIS — L03119 Cellulitis of unspecified part of limb: Secondary | ICD-10-CM | POA: Diagnosis not present

## 2014-07-25 DIAGNOSIS — Z4800 Encounter for change or removal of nonsurgical wound dressing: Secondary | ICD-10-CM | POA: Diagnosis not present

## 2014-07-25 DIAGNOSIS — Z8546 Personal history of malignant neoplasm of prostate: Secondary | ICD-10-CM | POA: Diagnosis not present

## 2014-07-27 DIAGNOSIS — L02419 Cutaneous abscess of limb, unspecified: Secondary | ICD-10-CM | POA: Diagnosis not present

## 2014-07-27 DIAGNOSIS — L97809 Non-pressure chronic ulcer of other part of unspecified lower leg with unspecified severity: Secondary | ICD-10-CM | POA: Diagnosis not present

## 2014-07-27 DIAGNOSIS — Z792 Long term (current) use of antibiotics: Secondary | ICD-10-CM | POA: Diagnosis not present

## 2014-07-27 DIAGNOSIS — Z8546 Personal history of malignant neoplasm of prostate: Secondary | ICD-10-CM | POA: Diagnosis not present

## 2014-07-27 DIAGNOSIS — Z4800 Encounter for change or removal of nonsurgical wound dressing: Secondary | ICD-10-CM | POA: Diagnosis not present

## 2014-07-29 DIAGNOSIS — Z8546 Personal history of malignant neoplasm of prostate: Secondary | ICD-10-CM | POA: Diagnosis not present

## 2014-07-29 DIAGNOSIS — L03119 Cellulitis of unspecified part of limb: Secondary | ICD-10-CM | POA: Diagnosis not present

## 2014-07-29 DIAGNOSIS — Z792 Long term (current) use of antibiotics: Secondary | ICD-10-CM | POA: Diagnosis not present

## 2014-07-29 DIAGNOSIS — L97809 Non-pressure chronic ulcer of other part of unspecified lower leg with unspecified severity: Secondary | ICD-10-CM | POA: Diagnosis not present

## 2014-07-29 DIAGNOSIS — Z4800 Encounter for change or removal of nonsurgical wound dressing: Secondary | ICD-10-CM | POA: Diagnosis not present

## 2014-07-29 DIAGNOSIS — L02419 Cutaneous abscess of limb, unspecified: Secondary | ICD-10-CM | POA: Diagnosis not present

## 2014-08-01 DIAGNOSIS — Z4800 Encounter for change or removal of nonsurgical wound dressing: Secondary | ICD-10-CM | POA: Diagnosis not present

## 2014-08-01 DIAGNOSIS — L97809 Non-pressure chronic ulcer of other part of unspecified lower leg with unspecified severity: Secondary | ICD-10-CM | POA: Diagnosis not present

## 2014-08-01 DIAGNOSIS — Z792 Long term (current) use of antibiotics: Secondary | ICD-10-CM | POA: Diagnosis not present

## 2014-08-01 DIAGNOSIS — L02419 Cutaneous abscess of limb, unspecified: Secondary | ICD-10-CM | POA: Diagnosis not present

## 2014-08-01 DIAGNOSIS — Z8546 Personal history of malignant neoplasm of prostate: Secondary | ICD-10-CM | POA: Diagnosis not present

## 2014-08-01 DIAGNOSIS — L03119 Cellulitis of unspecified part of limb: Secondary | ICD-10-CM | POA: Diagnosis not present

## 2014-08-04 DIAGNOSIS — L97809 Non-pressure chronic ulcer of other part of unspecified lower leg with unspecified severity: Secondary | ICD-10-CM | POA: Diagnosis not present

## 2014-08-04 DIAGNOSIS — Z792 Long term (current) use of antibiotics: Secondary | ICD-10-CM | POA: Diagnosis not present

## 2014-08-04 DIAGNOSIS — Z4800 Encounter for change or removal of nonsurgical wound dressing: Secondary | ICD-10-CM | POA: Diagnosis not present

## 2014-08-04 DIAGNOSIS — L02419 Cutaneous abscess of limb, unspecified: Secondary | ICD-10-CM | POA: Diagnosis not present

## 2014-08-04 DIAGNOSIS — Z8546 Personal history of malignant neoplasm of prostate: Secondary | ICD-10-CM | POA: Diagnosis not present

## 2014-08-10 DIAGNOSIS — G894 Chronic pain syndrome: Secondary | ICD-10-CM | POA: Diagnosis not present

## 2014-08-10 DIAGNOSIS — M48061 Spinal stenosis, lumbar region without neurogenic claudication: Secondary | ICD-10-CM | POA: Diagnosis not present

## 2014-08-10 DIAGNOSIS — Z79899 Other long term (current) drug therapy: Secondary | ICD-10-CM | POA: Diagnosis not present

## 2014-08-10 DIAGNOSIS — M5137 Other intervertebral disc degeneration, lumbosacral region: Secondary | ICD-10-CM | POA: Diagnosis not present

## 2014-08-16 DIAGNOSIS — M810 Age-related osteoporosis without current pathological fracture: Secondary | ICD-10-CM | POA: Diagnosis not present

## 2014-08-16 DIAGNOSIS — IMO0002 Reserved for concepts with insufficient information to code with codable children: Secondary | ICD-10-CM | POA: Diagnosis not present

## 2014-08-16 DIAGNOSIS — L259 Unspecified contact dermatitis, unspecified cause: Secondary | ICD-10-CM | POA: Diagnosis not present

## 2014-08-18 DIAGNOSIS — IMO0002 Reserved for concepts with insufficient information to code with codable children: Secondary | ICD-10-CM | POA: Diagnosis not present

## 2014-08-18 DIAGNOSIS — L259 Unspecified contact dermatitis, unspecified cause: Secondary | ICD-10-CM | POA: Diagnosis not present

## 2014-08-23 DIAGNOSIS — L259 Unspecified contact dermatitis, unspecified cause: Secondary | ICD-10-CM | POA: Diagnosis not present

## 2014-08-23 DIAGNOSIS — G8929 Other chronic pain: Secondary | ICD-10-CM | POA: Diagnosis not present

## 2014-08-23 DIAGNOSIS — M419 Scoliosis, unspecified: Secondary | ICD-10-CM | POA: Diagnosis not present

## 2014-08-23 DIAGNOSIS — H5440 Blindness, one eye, unspecified eye: Secondary | ICD-10-CM | POA: Diagnosis not present

## 2014-08-23 DIAGNOSIS — M5137 Other intervertebral disc degeneration, lumbosacral region: Secondary | ICD-10-CM | POA: Diagnosis not present

## 2014-08-23 DIAGNOSIS — M4120 Other idiopathic scoliosis, site unspecified: Secondary | ICD-10-CM | POA: Diagnosis not present

## 2014-08-23 DIAGNOSIS — L309 Dermatitis, unspecified: Secondary | ICD-10-CM | POA: Diagnosis not present

## 2014-08-23 DIAGNOSIS — M5136 Other intervertebral disc degeneration, lumbar region: Secondary | ICD-10-CM | POA: Diagnosis not present

## 2014-09-08 DIAGNOSIS — H26491 Other secondary cataract, right eye: Secondary | ICD-10-CM | POA: Diagnosis not present

## 2014-09-12 DIAGNOSIS — G894 Chronic pain syndrome: Secondary | ICD-10-CM | POA: Diagnosis not present

## 2014-09-12 DIAGNOSIS — M5136 Other intervertebral disc degeneration, lumbar region: Secondary | ICD-10-CM | POA: Diagnosis not present

## 2014-09-13 DIAGNOSIS — L309 Dermatitis, unspecified: Secondary | ICD-10-CM | POA: Diagnosis not present

## 2014-09-13 DIAGNOSIS — L820 Inflamed seborrheic keratosis: Secondary | ICD-10-CM | POA: Diagnosis not present

## 2014-09-13 DIAGNOSIS — L570 Actinic keratosis: Secondary | ICD-10-CM | POA: Diagnosis not present

## 2014-09-28 DIAGNOSIS — H2640 Unspecified secondary cataract: Secondary | ICD-10-CM | POA: Diagnosis not present

## 2014-09-28 DIAGNOSIS — H26491 Other secondary cataract, right eye: Secondary | ICD-10-CM | POA: Diagnosis not present

## 2014-10-10 DIAGNOSIS — G894 Chronic pain syndrome: Secondary | ICD-10-CM | POA: Diagnosis not present

## 2014-10-10 DIAGNOSIS — Z79891 Long term (current) use of opiate analgesic: Secondary | ICD-10-CM | POA: Diagnosis not present

## 2014-10-10 DIAGNOSIS — M5136 Other intervertebral disc degeneration, lumbar region: Secondary | ICD-10-CM | POA: Diagnosis not present

## 2014-10-17 DIAGNOSIS — Z1389 Encounter for screening for other disorder: Secondary | ICD-10-CM | POA: Diagnosis not present

## 2014-10-17 DIAGNOSIS — Z6823 Body mass index (BMI) 23.0-23.9, adult: Secondary | ICD-10-CM | POA: Diagnosis not present

## 2014-10-17 DIAGNOSIS — G4700 Insomnia, unspecified: Secondary | ICD-10-CM | POA: Diagnosis not present

## 2014-10-17 DIAGNOSIS — L309 Dermatitis, unspecified: Secondary | ICD-10-CM | POA: Diagnosis not present

## 2014-11-02 DIAGNOSIS — Z6823 Body mass index (BMI) 23.0-23.9, adult: Secondary | ICD-10-CM | POA: Diagnosis not present

## 2014-11-02 DIAGNOSIS — M810 Age-related osteoporosis without current pathological fracture: Secondary | ICD-10-CM | POA: Diagnosis not present

## 2014-11-08 DIAGNOSIS — L309 Dermatitis, unspecified: Secondary | ICD-10-CM | POA: Diagnosis not present

## 2014-11-17 ENCOUNTER — Encounter: Payer: Self-pay | Admitting: Neurology

## 2014-11-22 DIAGNOSIS — L080 Pyoderma: Secondary | ICD-10-CM | POA: Diagnosis not present

## 2014-11-22 DIAGNOSIS — L309 Dermatitis, unspecified: Secondary | ICD-10-CM | POA: Diagnosis not present

## 2014-12-01 DIAGNOSIS — L309 Dermatitis, unspecified: Secondary | ICD-10-CM | POA: Diagnosis not present

## 2014-12-19 DIAGNOSIS — L309 Dermatitis, unspecified: Secondary | ICD-10-CM | POA: Diagnosis not present

## 2014-12-21 ENCOUNTER — Encounter: Payer: Self-pay | Admitting: Neurology

## 2014-12-21 ENCOUNTER — Ambulatory Visit (INDEPENDENT_AMBULATORY_CARE_PROVIDER_SITE_OTHER): Payer: Medicare Other | Admitting: Neurology

## 2014-12-21 VITALS — BP 117/68 | HR 72 | Resp 12 | Ht 74.0 in | Wt 152.0 lb

## 2014-12-21 DIAGNOSIS — G4719 Other hypersomnia: Secondary | ICD-10-CM

## 2014-12-21 DIAGNOSIS — G609 Hereditary and idiopathic neuropathy, unspecified: Secondary | ICD-10-CM

## 2014-12-21 DIAGNOSIS — G4701 Insomnia due to medical condition: Secondary | ICD-10-CM

## 2014-12-21 MED ORDER — MELATONIN 5 MG PO TABS
ORAL_TABLET | ORAL | Status: DC
Start: 2014-12-21 — End: 2015-08-08

## 2014-12-21 MED ORDER — DOXYLAMINE SUCCINATE (SLEEP) 25 MG PO TABS: 25.0000 mg | ORAL_TABLET | Freq: Every evening | ORAL | Status: DC | PRN

## 2014-12-21 NOTE — Progress Notes (Addendum)
SLEEP MEDICINE CLINIC   Provider:  Larey Seat, M D  Referring Provider: Velna Hatchet, MD Primary Care Physician:  Velna Hatchet, MD  Chief Complaint  Patient presents with  . NP  Casey Gates Sleep Consult    Rm 10, alone    HPI:  Casey Gates is a widowed , caucasain, right handed 81 y.o. male , seen here as a referral from Dr. Ardeth Perfect for an insomnia evaluation.  Casey Gates states that beginning in the second half of last year 03-11-14 he begun having frequent nights with difficulties to initiate sleep. Sometimes he would not sleep at all most of these insomniac nights allow him for 2-3 hours of nocturnal sleep only. He also states that meanwhile this is a very common finding and affect him 2-3 times per week. He feels excessively fatigued and sleepy in daytime but at night seems to be more awake and stimulated with difficulties to initiating sleep. He is easily falling asleep when not stimulated or not physically active in daytime. To ambulate he uses a walker, uses he has a chronic back condition that begun in his 65s. Basically has lumbar degenerative disc disease and kyphoscoliosis which causes a chronic pain syndrome.  He is status post prostate carcinoma with TURP procedure 03/11/1996 ,and  is going to the bathroom once or twice at night only. He  also complained of " restless legs ", but this seems to be a Radiculo-pathic problem, pain arises from the  right groin and descends down the ant thigh , associated with back pain.  He has trouble finding a comfortbale spot to sit or sleep in.  His sleep habits are as follows: The patient usually goes to bed between 11PM  and midnight , on a good evening he will be asleep in 15 minutes.  He usually can sleep for 2-2-1/2 hours. He will wake up - not by  the bathroom urge or pain or discomfort. what wakes him  seems to be a spontaneous arousal.  And he feels very wakeful after that and has often trouble to fall back asleep. He is not excessively  worried or concerned and is not anxious, " Usually". The bedroom is cool , quiet but not dark - the building is lit from the outside.   Mostly has been reading after being awake . He has no obligations to get up in AM, ususally wakes up before the aid is coming to his room. About 5-6 AM.   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  The patient is blind in his left eye, Esotopia,  Has never smoked, has been a  Moderate -severe  Alcohol drinker but not longer ( over 5 years ).     Review of Systems: Out of a complete 14 system review, the patient complains of only the following symptoms, and all other reviewed systems are negative.  fatigue, sleepiness, insomnia at night, pain in groin, hip and lower back.  .   Poor ambulation .  Epworth score  16 , Fatigue severity score 51  , depression score PHQ 9 - 3 points.  The patient lost his daughter after a PE/ DVT at Age 70 , his son and 5 grandchildren are healthy.    History   Social History  . Marital Status: Widowed    Spouse Name: N/A    Number of Children: N/A  . Years of Education:  N/A   Occupational History  . Not on file.   Social History Main Topics  . Smoking status: Never Smoker   . Smokeless tobacco: Never Used  . Alcohol Use: No     Comment: QUIT ALCOHOL AT AGE 11  . Drug Use: No  . Sexual Activity: No   Other Topics Concern  . Not on file   Social History Narrative   Caffeine 2-3 cups average.  Widowed,  Lives at [facility], [place]. Living.  Retired Forensic psychologist.  One son.    Family History  Problem Relation Age of Onset  . Bone cancer Mother   . Hypertension Mother   . Arthritis Mother     Past Medical History  Diagnosis Date  . Blind left eye   . Restless leg   . Asthma     AS A CHILD  . Chronic kidney disease   . Cancer     PROSTATE CANCER  . DDD (degenerative disc disease), lumbar   . Chronic pain syndrome     . Insomnia     Past Surgical History  Procedure Laterality Date  . Prostatectomy    . Rigth nephrectomy  2006  . Right cataract extraction  2013  . Tonsillectomy    . Transurethral resection of prostate  1997  . Cholecystectomy      Current Outpatient Prescriptions  Medication Sig Dispense Refill  . mupirocin ointment (BACTROBAN) 2 % Place 1 application into the nose 3 (three) times daily.    Marland Kitchen oxymorphone (OPANA ER) 40 MG 12 hr tablet Take 40 mg by mouth every 12 (twelve) hours.    . polyethylene glycol (MIRALAX / GLYCOLAX) packet Take 17 g by mouth daily. For constipation    . pregabalin (LYRICA) 75 MG capsule Take 75 mg by mouth 2 (two) times daily.    Marland Kitchen zolpidem (AMBIEN) 10 MG tablet Take 10 mg by mouth at bedtime as needed for sleep.     No current facility-administered medications for this visit.    Allergies as of 12/21/2014 - Review Complete 12/21/2014  Allergen Reaction Noted  . Peanuts [peanut oil] Other (See Comments) 12/04/2012  . Coconut oil  05/07/2013    Vitals: BP 117/68 mmHg  Pulse 72  Resp 12  Ht 6\' 2"  (1.88 m)  Wt 152 lb (68.947 kg)  BMI 19.51 kg/m2 Last Weight:  Wt Readings from Last 1 Encounters:  12/21/14 152 lb (68.947 kg)       Last Height:   Ht Readings from Last 1 Encounters:  12/21/14 6\' 2"  (1.88 m)    Physical exam:  General: The patient is awake, alert and appears not in acute distress. The patient is well groomed. Head: Normocephalic, atraumatic. Neck is supple. Mallampati 2, has his own teeth.  neck circumference: 15 . Nasal airflow not restircted , TMJ is not evident . Retrognathia is not seen.  Cardiovascular:  Regular rate and rhythm , without  murmurs or carotid bruit, and without distended neck veins. Respiratory: Lungs are clear to auscultation. Skin:  Without evidence of rash, but discoloration, red, dystrophic skin - Left more than right lower extremities. he has no edema in nlegs.  Trunk: BMI is normal.   Neurologic exam  : The patient is awake and alert, oriented to place and time.   Memory subjective  described as intact.  There is a normal attention span & concentration ability.  Speech is fluent without dysarthria,but mild dysphonia. Mood and affect are appropriate.  Cranial nerves: Pupil  on the left is briskly reactive to light. Consensual response  (on the left )only. Funduscopic exam: I cannot appreciate the left, normal on the right , status post cataract.   Extraocular movements  in vertical and horizontal planes intact and without nystagmus!Nicki [place] field by finger perimetry are intact. Hearing to finger rub intact. Facial sensation intact to fine touch. The left eye doesn't close completely, Facial motor strength is symmetric and tongue and uvula move midline.  Motor exam:   Normal tone ,muscle bulk and symmetric ,strength in all extremities. Atrophy of metacarbal and finger muscles, atrophy of lower shin muscles    Sensory:  Complete loss of sensation for vibration, for fine touch and for temperature at the knees and below. .  Coordination: Rapid alternating movements in the fingers/hands is normal.  Finger-to-nose maneuver  normal without evidence of ataxia, dysmetria but  tremor. The left hand has darker, red skin.   Gait and station: Patient walks with an  assistive device and is unable unassisted to climb up to the exam table.  Deep tendon reflexes: in the  upper and lower extremities are absent , Babinski maneuver response was deferred.   Assessment:  After physical and neurologic examination, review of laboratory studies, imaging, neurophysiology testing and pre-existing records, assessment after a 45 minute visit is as follows. The patient spent more than 50 % of this face to face time in evelaution, discussion and education of the disorder. Insomnia, further mentioned were  Neuropathy and  Muscle atrophy, which will need to be  worked up by a different specialist .  :  1) Mr. Zink has few  risk factors for sleep apnea,  but  he describes is clearly insomnia with resulting daytime hypersomnia and high degrees of fatigue.  2) He has a very fine amplitude tremor at rest as well as with action . 3) he has a severe lower extremity neuropathy and muscle atrophy affecting  Hand,  feet and shin. His lower deep tendon reflexes are absent .  I do not think that a spinal stenosis is the main cause . Given that he has trouble initiating sleep in a sleep study, it  may be tricky for him  To have a valid result.  First of al, l he needs to sleep a couple of hours at least to give valuable data for differential diagnosis.  He states that neither Belsomra nor  Ambien have given him the desired effect. He felt no improvement on  fairly strong narcotic pain medication. I dicided for  Lyrica. Lyrica can be used with trazodone, in a patient of Mr. Reiswig is age I would not use Seroquel.  If Ambien at 10 mg did not give the patient relief,  Iit is not possible that a higher dose can be used.   I strongly suggest melatonin at 5 mg to be taken an hour before intended bedtime and  trazodone at bedtime at the lowest possible dose.  I would like to order a sleep study when the patient is able to predict sleep onset within an hour of going to sleep , which right now is not the case.   The patient was advised of the nature of the diagnosed sleep disorder , the treatment options and risks for general a health and wellness arising from not treating the condition. Visit duration was 45 minutes.   Plan:  Treatment plan and additional workup : Melatonin 25 mg at night time po. Unisom OTC one at night po.  Sleep study attended, for Feb 2016. Needs a Actuary.      Asencion Partridge Immanuel Fedak MD  12/21/2014

## 2014-12-27 DIAGNOSIS — L249 Irritant contact dermatitis, unspecified cause: Secondary | ICD-10-CM | POA: Diagnosis not present

## 2015-01-04 ENCOUNTER — Telehealth: Payer: Self-pay | Admitting: Neurology

## 2015-01-04 DIAGNOSIS — R0683 Snoring: Secondary | ICD-10-CM

## 2015-01-04 DIAGNOSIS — Z125 Encounter for screening for malignant neoplasm of prostate: Secondary | ICD-10-CM | POA: Diagnosis not present

## 2015-01-04 DIAGNOSIS — R351 Nocturia: Secondary | ICD-10-CM

## 2015-01-04 DIAGNOSIS — R8299 Other abnormal findings in urine: Secondary | ICD-10-CM | POA: Diagnosis not present

## 2015-01-04 DIAGNOSIS — G4730 Sleep apnea, unspecified: Secondary | ICD-10-CM

## 2015-01-04 DIAGNOSIS — R5383 Other fatigue: Secondary | ICD-10-CM | POA: Diagnosis not present

## 2015-01-04 DIAGNOSIS — E559 Vitamin D deficiency, unspecified: Secondary | ICD-10-CM | POA: Diagnosis not present

## 2015-01-04 NOTE — Telephone Encounter (Signed)
We expect sitter or relative to come with patients that can not safely ambulate to the bathroom,  need help with dressing or have dementia/ confusion.   We do have technologists  Present during the sleep study, but not nurses-  nor do we have hospital beds or [facility] lifting equipment.   The patient will be scheduled with a male tech , and will need to bring his walker.  Advised that the patient has a higher fall risk, and will be asked to wait for assistance if any bathroom breaks occur.   I left a VM with the patient, CD

## 2015-01-04 NOTE — Telephone Encounter (Signed)
Received call from pt's son regarding the sleep study that he has not yet scheduled.  Per Dr. Edwena Felty instructions, she preferred for the pt to have a sitter.  Son says that he is the only child or the only family that the pt has and he feels that it would be an imposition if he had to stay.  He is requesting that Dr. Brett Fairy change her mind and allow the pt to undergo sleep study on his own.  Says that the pt lives independently and there should be no reason for him to have to stay.  Advised pt's son that I would send request to Dr. Brett Fairy.

## 2015-01-06 NOTE — Telephone Encounter (Signed)
Will forward to [facility] for decision and to contact son or pt

## 2015-01-06 NOTE — Telephone Encounter (Signed)
He can have his PSG here, but needs to wait for assistance if he needs to get up- he is extremely unsteady .

## 2015-01-09 DIAGNOSIS — L309 Dermatitis, unspecified: Secondary | ICD-10-CM | POA: Diagnosis not present

## 2015-01-11 DIAGNOSIS — G894 Chronic pain syndrome: Secondary | ICD-10-CM | POA: Diagnosis not present

## 2015-01-11 DIAGNOSIS — G4700 Insomnia, unspecified: Secondary | ICD-10-CM | POA: Diagnosis not present

## 2015-01-11 DIAGNOSIS — Z23 Encounter for immunization: Secondary | ICD-10-CM | POA: Diagnosis not present

## 2015-01-11 DIAGNOSIS — H9190 Unspecified hearing loss, unspecified ear: Secondary | ICD-10-CM | POA: Diagnosis not present

## 2015-01-11 DIAGNOSIS — E559 Vitamin D deficiency, unspecified: Secondary | ICD-10-CM | POA: Diagnosis not present

## 2015-01-11 DIAGNOSIS — Z0000 Encounter for general adult medical examination without abnormal findings: Secondary | ICD-10-CM | POA: Diagnosis not present

## 2015-01-11 DIAGNOSIS — M810 Age-related osteoporosis without current pathological fracture: Secondary | ICD-10-CM | POA: Diagnosis not present

## 2015-01-11 DIAGNOSIS — L309 Dermatitis, unspecified: Secondary | ICD-10-CM | POA: Diagnosis not present

## 2015-01-11 DIAGNOSIS — Z6823 Body mass index (BMI) 23.0-23.9, adult: Secondary | ICD-10-CM | POA: Diagnosis not present

## 2015-01-11 NOTE — Telephone Encounter (Signed)
Does he need to be scheduled as a 1:1 patient?

## 2015-01-11 NOTE — Telephone Encounter (Signed)
i say , yes.

## 2015-01-18 NOTE — Telephone Encounter (Signed)
I have scheduled the patient's sleep study and assigned Casey Gates to be his technologist.  Ellin Mayhew, his son states he is 100% ambulatory and cares for himself at home.  Please enter orders for split night study as you see required and I will link orders to the appointment.

## 2015-01-19 DIAGNOSIS — L309 Dermatitis, unspecified: Secondary | ICD-10-CM | POA: Diagnosis not present

## 2015-01-23 DIAGNOSIS — Z1212 Encounter for screening for malignant neoplasm of rectum: Secondary | ICD-10-CM | POA: Diagnosis not present

## 2015-02-06 DIAGNOSIS — H02102 Unspecified ectropion of right lower eyelid: Secondary | ICD-10-CM | POA: Diagnosis not present

## 2015-02-06 DIAGNOSIS — H2522 Age-related cataract, morgagnian type, left eye: Secondary | ICD-10-CM | POA: Diagnosis not present

## 2015-02-06 DIAGNOSIS — H34812 Central retinal vein occlusion, left eye: Secondary | ICD-10-CM | POA: Diagnosis not present

## 2015-02-06 DIAGNOSIS — H1851 Endothelial corneal dystrophy: Secondary | ICD-10-CM | POA: Diagnosis not present

## 2015-02-13 ENCOUNTER — Telehealth: Payer: Self-pay | Admitting: Neurology

## 2015-02-13 NOTE — Telephone Encounter (Signed)
Called and confirmed appointment for sleep study on 02/14/15.

## 2015-02-14 ENCOUNTER — Ambulatory Visit (INDEPENDENT_AMBULATORY_CARE_PROVIDER_SITE_OTHER): Payer: Medicare Other | Admitting: Neurology

## 2015-02-14 DIAGNOSIS — G4701 Insomnia due to medical condition: Secondary | ICD-10-CM

## 2015-02-14 DIAGNOSIS — R0683 Snoring: Secondary | ICD-10-CM

## 2015-02-14 DIAGNOSIS — G4719 Other hypersomnia: Secondary | ICD-10-CM

## 2015-02-14 DIAGNOSIS — R351 Nocturia: Secondary | ICD-10-CM

## 2015-02-14 DIAGNOSIS — G4730 Sleep apnea, unspecified: Secondary | ICD-10-CM

## 2015-02-14 DIAGNOSIS — G609 Hereditary and idiopathic neuropathy, unspecified: Secondary | ICD-10-CM

## 2015-02-15 NOTE — Sleep Study (Signed)
Please see the scanned sleep study interpretation located in the Procedure tab within the Chart Review section. 

## 2015-02-22 DIAGNOSIS — R0683 Snoring: Secondary | ICD-10-CM | POA: Insufficient documentation

## 2015-02-22 DIAGNOSIS — G4719 Other hypersomnia: Secondary | ICD-10-CM | POA: Insufficient documentation

## 2015-02-22 DIAGNOSIS — G4730 Sleep apnea, unspecified: Secondary | ICD-10-CM | POA: Insufficient documentation

## 2015-02-22 DIAGNOSIS — R351 Nocturia: Secondary | ICD-10-CM | POA: Insufficient documentation

## 2015-02-25 ENCOUNTER — Other Ambulatory Visit: Payer: Self-pay | Admitting: Neurology

## 2015-02-25 ENCOUNTER — Encounter: Payer: Self-pay | Admitting: Neurology

## 2015-02-25 DIAGNOSIS — G4731 Primary central sleep apnea: Secondary | ICD-10-CM

## 2015-02-28 ENCOUNTER — Telehealth: Payer: Self-pay | Admitting: *Deleted

## 2015-02-28 ENCOUNTER — Encounter: Payer: Self-pay | Admitting: *Deleted

## 2015-02-28 NOTE — Telephone Encounter (Signed)
Spoke with the patient regarding the results of his sleep study.  The patient needs to speak with his son regarding the referral process to a DME.  His son Nygel Prokop is to contact our office next week to arrange DME referral.  The patient gave verbal permission to mail a copy of his test results.  A letter was also sent reminding the patient of the need to contact our office.  Dr. Velna Hatchet was faxed a copy of the sleep study report.

## 2015-03-30 DIAGNOSIS — Z6823 Body mass index (BMI) 23.0-23.9, adult: Secondary | ICD-10-CM | POA: Diagnosis not present

## 2015-03-30 DIAGNOSIS — S51001A Unspecified open wound of right elbow, initial encounter: Secondary | ICD-10-CM | POA: Diagnosis not present

## 2015-03-31 DIAGNOSIS — S51001D Unspecified open wound of right elbow, subsequent encounter: Secondary | ICD-10-CM | POA: Diagnosis not present

## 2015-03-31 DIAGNOSIS — Z6823 Body mass index (BMI) 23.0-23.9, adult: Secondary | ICD-10-CM | POA: Diagnosis not present

## 2015-04-12 DIAGNOSIS — L309 Dermatitis, unspecified: Secondary | ICD-10-CM | POA: Diagnosis not present

## 2015-04-19 ENCOUNTER — Telehealth: Payer: Self-pay

## 2015-04-19 NOTE — Telephone Encounter (Signed)
Spoke to [place] in the sleep lab who spoke to pt's son. Pt son wishes for [place] to send the order for cpap over to [facility]. I sent the order to [facility], and Alycia let the son know.

## 2015-04-20 DIAGNOSIS — Z6823 Body mass index (BMI) 23.0-23.9, adult: Secondary | ICD-10-CM | POA: Diagnosis not present

## 2015-04-20 DIAGNOSIS — L089 Local infection of the skin and subcutaneous tissue, unspecified: Secondary | ICD-10-CM | POA: Diagnosis not present

## 2015-04-28 DIAGNOSIS — Z6823 Body mass index (BMI) 23.0-23.9, adult: Secondary | ICD-10-CM | POA: Diagnosis not present

## 2015-04-28 DIAGNOSIS — L089 Local infection of the skin and subcutaneous tissue, unspecified: Secondary | ICD-10-CM | POA: Diagnosis not present

## 2015-05-15 DIAGNOSIS — L309 Dermatitis, unspecified: Secondary | ICD-10-CM | POA: Diagnosis not present

## 2015-06-01 DIAGNOSIS — L308 Other specified dermatitis: Secondary | ICD-10-CM | POA: Diagnosis not present

## 2015-06-01 DIAGNOSIS — L270 Generalized skin eruption due to drugs and medicaments taken internally: Secondary | ICD-10-CM | POA: Diagnosis not present

## 2015-06-28 DIAGNOSIS — M6281 Muscle weakness (generalized): Secondary | ICD-10-CM | POA: Diagnosis not present

## 2015-06-28 DIAGNOSIS — M25561 Pain in right knee: Secondary | ICD-10-CM | POA: Diagnosis not present

## 2015-07-07 DIAGNOSIS — M5136 Other intervertebral disc degeneration, lumbar region: Secondary | ICD-10-CM | POA: Diagnosis not present

## 2015-07-07 DIAGNOSIS — G894 Chronic pain syndrome: Secondary | ICD-10-CM | POA: Diagnosis not present

## 2015-07-07 DIAGNOSIS — Z79891 Long term (current) use of opiate analgesic: Secondary | ICD-10-CM | POA: Diagnosis not present

## 2015-07-07 DIAGNOSIS — M4806 Spinal stenosis, lumbar region: Secondary | ICD-10-CM | POA: Diagnosis not present

## 2015-07-10 ENCOUNTER — Ambulatory Visit (INDEPENDENT_AMBULATORY_CARE_PROVIDER_SITE_OTHER): Payer: Medicare Other | Admitting: Neurology

## 2015-07-10 ENCOUNTER — Encounter: Payer: Self-pay | Admitting: Neurology

## 2015-07-10 VITALS — BP 138/64 | HR 86 | Resp 20 | Ht 69.0 in | Wt 151.0 lb

## 2015-07-10 DIAGNOSIS — G4733 Obstructive sleep apnea (adult) (pediatric): Secondary | ICD-10-CM | POA: Diagnosis not present

## 2015-07-10 DIAGNOSIS — Z7189 Other specified counseling: Secondary | ICD-10-CM

## 2015-07-10 DIAGNOSIS — Z9989 Dependence on other enabling machines and devices: Secondary | ICD-10-CM

## 2015-07-10 NOTE — Progress Notes (Signed)
SLEEP MEDICINE CLINIC   Provider:  Larey Seat, M D  Referring Provider: Velna Hatchet, MD Primary Care Physician:  Velna Hatchet, MD  No chief complaint on file.   HPI:  Casey Gates is a widowed , caucasian, right handed 81 y.o. male , seen here as a referral from Dr. Ardeth Perfect for an insomnia evaluation.  Casey Gates states that beginning in the second half of last year 2014-03-26 he begun having frequent nights with difficulties to initiate sleep. Sometimes he would not sleep at all most of these insomniac nights allow him for 2-3 hours of nocturnal sleep only. He also states that meanwhile this is a very common finding and affect him 2-3 times per week. He feels excessively fatigued and sleepy in daytime but at night seems to be more awake and stimulated with difficulties to initiating sleep. He is easily falling asleep when not stimulated or not physically active in daytime. To ambulate he uses a walker, uses he has a chronic back condition that begun in his 34s. Basically has lumbar degenerative disc disease and kyphoscoliosis which causes a chronic pain syndrome.  He is status post prostate carcinoma with TURP procedure 03/26/1996 ,and  is going to the bathroom once or twice at night only. He  also complained of " restless legs ", but this seems to be a Radiculo-pathic problem, pain arises from the  right groin and descends down the ant thigh , associated with back pain.  He has trouble finding a comfortbale spot to sit or sleep in.  His sleep habits are as follows: The patient usually goes to bed between 11PM  and midnight , on a good evening he will be asleep in 15 minutes.He usually can sleep for 2-2-1/2 hours. He will wake up - not by  the bathroom urge or pain or discomfort. what wakes him  seems to be a spontaneous arousal. And he feels very wakeful after that and has often trouble to fall back asleep. He is not excessively worried or concerned and is not anxious, " Usually". The bedroom  is cool , quiet but not dark - the building is lit from the outside. Mostly has been reading after being awake . He has no obligations to get up in AM, ususally wakes up before the aid is coming to his room. About 5-6 AM.  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 The patient is blind in his left eye, Esotopia,Has never smoked, has been a  Moderate -severe  Alcohol drinker but not longer ( over 5 years ).    This is an interval history from Casey Gates, from 07-10-15. [name] split-night polysomnography from 02-14-15 documented an AHI of 51.8 and an RDI of the same number his lowest oxygen desaturation was 77% and a total time in desaturation was 66.9 minutes which is high. He was titrated to 7 cm water pressure and the AHI was 0.0. The setting was only witnessed for about 35 minutes of monitored sleep and half of the time was spent in rem sleep. The sleep efficiency increased to 97.4%. He also seemed to have had suddenly some leg kicking during the CPAP titration that was not visible before this may be a sign of poor tolerance to CPAP however it stopped before the sleep study was and finally titrated. I have today the opportunity to look at a download of data the therapeutic  data show an 87% compliance for 26 out of 30 days of use but the average user time is only 3 hours and 16 minutes. So he has improved in comparison to his last download. The set pressure is 7 cm water with 3 cm EPR but the AHI is suddenly shot up to 24 apneas that's almost 50% of what he was diagnosed with. There is a very high air leak but the patient just changed to a new mask. I wonder if he does not achieve a good seal with this  mask either. He was confused about the letters R and L on the nasal pillow- I explained that these mean the nostrils right and left. We used a mask form our sleep lab and had the patient try it on and plug it into the frame.     Review of Systems: Out of a complete 14 system review, the patient complains of only the following symptoms, and all other reviewed systems are negative.  fatigue, sleepiness, insomnia at night, pain in groin, hip and lower back.  .   Poor ambulation .  Epworth score 9 from 16 , Fatigue severity score  49 from 51  , depression score PHQ 9 - 3 points. Geriatric depression score 6 points.   The patient lost his daughter after a PE/ DVT at Age 53 , his son is here with him , and 5 grandchildren are healthy.    History   Social History  . Marital Status: Widowed    Spouse Name: N/A  . Number of Children: N/A  . Years of Education: N/A   Occupational History  . Not on file.   Social History Main Topics  . Smoking status: Never Smoker   . Smokeless tobacco: Never Used  . Alcohol Use: No     Comment: QUIT ALCOHOL AT AGE 48  . Drug Use: No  . Sexual Activity: No   Other Topics Concern  . Not on file   Social History Narrative   Caffeine 2-3 cups average.  Widowed,  Lives at [facility], [place]. Living.  Retired Forensic psychologist.  One son.    Family History  Problem Relation Age of Onset  . Bone cancer Mother   . Hypertension Mother   . Arthritis Mother     Past Medical History  Diagnosis Date  . Blind left eye   . Restless leg   . Asthma     AS A CHILD  . Chronic kidney disease   . Cancer     PROSTATE CANCER  . DDD (degenerative disc disease), lumbar   . Chronic pain syndrome   . Insomnia     Past Surgical History  Procedure Laterality Date  . Prostatectomy    . Rigth nephrectomy  2006  . Right cataract extraction  2013  . Tonsillectomy    . Transurethral resection of prostate  1997  . Cholecystectomy      Current Outpatient Prescriptions  Medication Sig Dispense Refill  . gabapentin (NEURONTIN) 300 MG capsule Take 300 mg by mouth 2 (two) times daily.  0  . oxymorphone (OPANA ER) 40 MG 12 hr tablet Take 40 mg by mouth every 12 (twelve) hours.    .  pregabalin (LYRICA) 75 MG capsule Take 75 mg by mouth 2 (two) times daily.    Marland Kitchen doxylamine, Sleep, (UNISOM) 25 MG tablet Take 1 tablet (25 mg total) by mouth at bedtime as needed. (Patient not taking: Reported on 07/10/2015) 30 tablet 0  .  Melatonin 5 MG TABS OTC, 30 minutes prior to bedtime. (Patient not taking: Reported on 07/10/2015) 30 tablet 0  . mupirocin ointment (BACTROBAN) 2 % Place 1 application into the nose 3 (three) times daily.    . polyethylene glycol (MIRALAX / GLYCOLAX) packet Take 17 g by mouth daily. For constipation    . triamcinolone cream (KENALOG) 0.1 %   0   No current facility-administered medications for this visit.    Allergies as of 07/10/2015 - Review Complete 07/10/2015  Allergen Reaction Noted  . Peanuts [peanut oil] Other (See Comments) 12/04/2012  . Coconut oil  05/07/2013    Vitals: BP 138/64 mmHg  Pulse 86  Resp 20  Ht 5\' 9"  (1.753 m)  Wt 151 lb (68.493 kg)  BMI 22.29 kg/m2 Last Weight:  Wt Readings from Last 1 Encounters:  07/10/15 151 lb (68.493 kg)       Last Height:   Ht Readings from Last 1 Encounters:  07/10/15 5\' 9"  (1.753 m)    Physical exam:  General: The patient is awake, alert and appears not in acute distress. The patient is well groomed. Head: Normocephalic, atraumatic. Neck is supple. Mallampati 2, has his own teeth. Some gaps in the lower dental set.  neck circumference: 15 inches . Nasal airflow not restricted ,  TMJ is not evident , no click . Retrognathia is not seen.  Cardiovascular:  Regular rate and rhythm , without  murmurs or carotid bruit, and without distended neck veins. Respiratory: Lungs are clear to auscultation. Skin:  Without evidence of rash, but discoloration, red, dystrophic skin - Left more than right lower extremities. he has no edema in nlegs.  Trunk: BMI is normal.   Neurologic exam : The patient is awake and alert, oriented to place and time.   Memory subjective  described as intact.  There is a normal  attention span & concentration ability.  Speech is fluent without dysarthria,but mild dysphonia. Mood and affect are appropriate.  Cranial nerves: Pupils pinpoint.  Consensual response  (on the left ) only-  status post cataract. Blind in left eye  Hearing to finger rub intact. Facial sensation intact to fine touch. The left eye doesn't close completely,  Facial motor strength is symmetric and tongue and uvula move midline. Shoulder shrug symmetric   Motor exam:   Normal tone ,muscle bulk and symmetric ,strength in all extremities. Atrophy of metacarbal and finger muscles, atrophy of lower shin muscles    Sensory:  Complete loss of sensation for vibration, for fine touch and for temperature at the knees and below. .  Coordination: Rapid alternating movements in the fingers/hands is normal.  Finger-to-nose maneuver  normal without evidence of ataxia, dysmetria but  tremor. The left hand has darker, red skin.   Gait and station: Patient walks with an  assistive device and is unable unassisted to climb up to the exam table.  Deep tendon reflexes: in the  upper and lower extremities are absent , Babinski maneuver response was deferred.   Assessment:  After physical and neurologic examination, review of laboratory studies, imaging, neurophysiology testing and pre-existing records, assessment after a 45 minute visit is as follows. The patient spent more than 50 % of this face to face time in evelaution, discussion and education of the disorder. Insomnia, further mentioned were  Neuropathy and  Muscle atrophy, which will need to be  worked up by a different specialist .  :  1) Mr. Winkleman has few risk factors for sleep  apnea,  but  he describes is clearly insomnia with resulting daytime hypersomnia and high degrees of fatigue.  2) He has a very fine amplitude tremor at rest as well as with action . 3) he has a severe lower extremity neuropathy and muscle atrophy affecting  Hand,  feet and shin. His  lower deep tendon reflexes are absent .  I do not think that a spinal stenosis is the main cause . Given that he has trouble initiating sleep in a sleep study, it  may be tricky for him  To have a valid result.  First of al, l he needs to sleep a couple of hours at least to give valuable data for differential diagnosis.  He states that neither Belsomra nor  Ambien have given him the desired effect. He felt no improvement on  fairly strong narcotic pain medication. I dicided for  Lyrica. Lyrica can be used with trazodone, in a patient of Mr. Hayduk is age I would not use Seroquel.  If Ambien at 10 mg did not give the patient relief,  Iit is not possible that a higher dose can be used.   I strongly suggest melatonin at 5 mg to be taken an hour before intended bedtime and  trazodone at bedtime at the lowest possible dose.  I would like to order a sleep study when the patient is able to predict sleep onset within an hour of going to sleep , which right now is not the case.   The patient was advised of the nature of the diagnosed sleep disorder , the treatment options and risks for general a health and wellness arising from not treating the condition. Visit duration was 45 minutes.   Plan:  Treatment plan and additional workup : Melatonin 25 mg at night time po. Unisom OTC one at night po.  Sleep study attended, for Feb 2016. Needs a Actuary.      Asencion Partridge Petrice Beedy MD  07/10/2015

## 2015-07-10 NOTE — Patient Instructions (Signed)
We have worked today on the high air leak, which I attributed to the nasal pillow being not correctly plugged into the frame.  I expect a new download in 90 days. You will follow up with my NP,Megan  Millikan, for a visit.

## 2015-07-13 DIAGNOSIS — L309 Dermatitis, unspecified: Secondary | ICD-10-CM | POA: Diagnosis not present

## 2015-07-18 ENCOUNTER — Encounter (HOSPITAL_COMMUNITY): Payer: Self-pay | Admitting: Emergency Medicine

## 2015-07-18 ENCOUNTER — Emergency Department (HOSPITAL_COMMUNITY)
Admission: EM | Admit: 2015-07-18 | Discharge: 2015-07-19 | Disposition: A | Discharge status: Home / Self Care | Payer: Medicare Other | Attending: Emergency Medicine | Admitting: Emergency Medicine

## 2015-07-18 ENCOUNTER — Emergency Department (HOSPITAL_COMMUNITY): Payer: Medicare Other

## 2015-07-18 DIAGNOSIS — N189 Chronic kidney disease, unspecified: Secondary | ICD-10-CM | POA: Diagnosis not present

## 2015-07-18 DIAGNOSIS — J45909 Unspecified asthma, uncomplicated: Secondary | ICD-10-CM | POA: Diagnosis not present

## 2015-07-18 DIAGNOSIS — Z8739 Personal history of other diseases of the musculoskeletal system and connective tissue: Secondary | ICD-10-CM | POA: Insufficient documentation

## 2015-07-18 DIAGNOSIS — R55 Syncope and collapse: Secondary | ICD-10-CM | POA: Insufficient documentation

## 2015-07-18 DIAGNOSIS — H5442 Blindness, left eye, normal vision right eye: Secondary | ICD-10-CM | POA: Diagnosis not present

## 2015-07-18 DIAGNOSIS — R079 Chest pain, unspecified: Secondary | ICD-10-CM | POA: Diagnosis not present

## 2015-07-18 DIAGNOSIS — G894 Chronic pain syndrome: Secondary | ICD-10-CM | POA: Diagnosis not present

## 2015-07-18 DIAGNOSIS — Z79899 Other long term (current) drug therapy: Secondary | ICD-10-CM | POA: Diagnosis not present

## 2015-07-18 DIAGNOSIS — Z8546 Personal history of malignant neoplasm of prostate: Secondary | ICD-10-CM | POA: Insufficient documentation

## 2015-07-18 LAB — BASIC METABOLIC PANEL
Anion gap: 7 (ref 5–15)
BUN: 21 mg/dL — AB (ref 6–20)
CALCIUM: 9.3 mg/dL (ref 8.9–10.3)
CHLORIDE: 105 mmol/L (ref 101–111)
CO2: 29 mmol/L (ref 22–32)
Creatinine, Ser: 0.9 mg/dL (ref 0.61–1.24)
GFR calc Af Amer: >60 mL/min (ref >60)
GFR calc non Af Amer: >60 mL/min (ref >60)
Glucose, Bld: 105 mg/dL — ABNORMAL HIGH (ref 65–99)
Potassium: 4.4 mmol/L (ref 3.5–5.1)
SODIUM: 141 mmol/L (ref 135–145)

## 2015-07-18 LAB — I-STAT TROPONIN, ED: Troponin i, poc: 0.01 ng/mL (ref 0.00–0.08)

## 2015-07-18 LAB — CBC
HCT: 42.8 % (ref 39.0–52.0)
Hemoglobin: 13.6 g/dL (ref 13.0–17.0)
MCH: 28.4 pg (ref 26.0–34.0)
MCHC: 31.8 g/dL (ref 30.0–36.0)
MCV: 89.4 fL (ref 78.0–100.0)
Platelets: 181 10*3/uL (ref 150–400)
RBC: 4.79 MIL/uL (ref 4.22–5.81)
RDW: 12.9 % (ref 11.5–15.5)
WBC: 8.2 10*3/uL (ref 4.0–10.5)

## 2015-07-18 LAB — URINALYSIS, ROUTINE W REFLEX MICROSCOPIC
Bilirubin Urine: NEGATIVE
Glucose, UA: NEGATIVE mg/dL
HGB URINE DIPSTICK: NEGATIVE
Ketones, ur: NEGATIVE mg/dL
Leukocytes, UA: NEGATIVE
Nitrite: NEGATIVE
PH: 7 (ref 5.0–8.0)
Protein, ur: NEGATIVE mg/dL
Specific Gravity, Urine: 1.015 (ref 1.005–1.030)
Urobilinogen, UA: 0.2 mg/dL (ref 0.0–1.0)

## 2015-07-18 LAB — CBG MONITORING, ED: Glucose-Capillary: 103 mg/dL — ABNORMAL HIGH (ref 65–99)

## 2015-07-18 NOTE — ED Notes (Signed)
Unable to collect labs at this time.  I called patient name and did not get a response.

## 2015-07-18 NOTE — ED Provider Notes (Signed)
CSN: [id number]     Arrival date & time 07/18/15  1604 History   First MD Initiated Contact with Patient 07/18/15 1945     Chief Complaint  Patient presents with  . Chest Pain  . Near Syncope     (Consider location/radiation/quality/duration/timing/severity/associated sxs/prior Treatment) HPI 81 year old male who presents with near syncope. History of chronic kidney disease. He reports pain in his usual state of health, but yesterday felt unwell after eating. Although CC states chest pain, he denies chest pain but reports that he felt an uneasiness in his chest. This was not worse with exertion, and not associated with palpitations, SOB, syncope, diaphoresis, nausea, vomiting, or abdominal pain. This self resolved. Today, He had recurrence of a "weird sensation" in his chest, but denies feeling pressure or pain. Notes that he felt lightheaded, but did not pass out. He was then brought to ED for evaluation. He has been in usual state of health prior to this. Was seen in ED multiple times in 2014-2015 for near syncope, which he reports is similar. During that time had holter monitoring and ECHO showing 65%. States that he has been getting work-up for insomnia and taking oxymorphone and more recently lyrica.  Past Medical History  Diagnosis Date  . Blind left eye   . Restless leg   . Asthma     AS A CHILD  . Chronic kidney disease   . Cancer     PROSTATE CANCER  . DDD (degenerative disc disease), lumbar   . Chronic pain syndrome   . Insomnia    Past Surgical History  Procedure Laterality Date  . Prostatectomy    . Rigth nephrectomy  2006  . Right cataract extraction  2013  . Tonsillectomy    . Transurethral resection of prostate  1997  . Cholecystectomy     Family History  Problem Relation Age of Onset  . Bone cancer Mother   . Hypertension Mother   . Arthritis Mother    Social History  Substance Use Topics  . Smoking status: Never Smoker   . Smokeless tobacco: Never Used   . Alcohol Use: No     Comment: QUIT ALCOHOL AT AGE 6    Review of Systems 10/14 systems reviewed and are negative other than those stated in the HPI    Allergies  Peanuts and Coconut oil  Home Medications   Prior to Admission medications   Medication Sig Start Date End Date Taking? Authorizing Provider  clobetasol cream (TEMOVATE) 0.17 % Apply 1 application topically 2 (two) times daily. 07/14/15  Yes Historical Provider, MD  docusate sodium (COLACE) 100 MG capsule Take 100 mg by mouth daily.   Yes Historical Provider, MD  doxylamine, Sleep, (UNISOM) 25 MG tablet Take 1 tablet (25 mg total) by mouth at bedtime as needed. Patient taking differently: Take 25 mg by mouth at bedtime as needed for sleep.  12/21/14  Yes Carmen Dohmeier, MD  oxyCODONE-acetaminophen (PERCOCET/ROXICET) 5-325 MG per tablet Take 1 tablet by mouth 2 (two) times daily as needed (BREAKTHROUGH PAIN).  07/07/15  Yes Historical Provider, MD  oxymorphone (OPANA ER) 40 MG 12 hr tablet Take 40 mg by mouth every 12 (twelve) hours.   Yes Historical Provider, MD  polyethylene glycol (MIRALAX / GLYCOLAX) packet Take 17 g by mouth daily as needed for moderate constipation. For constipation   Yes Historical Provider, MD  pregabalin (LYRICA) 75 MG capsule Take 75 mg by mouth 2 (two) times daily.   Yes Historical Provider,  MD  Melatonin 5 MG TABS OTC, 30 minutes prior to bedtime. Patient not taking: Reported on 07/10/2015 12/21/14   Larey Seat, MD   BP 136/67 mmHg  Pulse 68  Temp(Src) 98.1 F (36.7 C) (Oral)  Resp 14  SpO2 97% Physical Exam Physical Exam  Nursing note and vitals reviewed. Constitutional: Well developed, well nourished, non-toxic, and in no acute distress Head: Normocephalic and atraumatic.  Mouth/Throat: Oropharynx is clear and moist.  Neck: Normal range of motion. Neck supple.  Cardiovascular: Normal rate and regular rhythm. NO appreciable murmur. +2 symmetrical distal pulses bilaterally. No edema.    Pulmonary/Chest: Effort normal and breath sounds normal.  Abdominal: Soft. There is no tenderness. There is no rebound and no guarding.  Musculoskeletal: Normal range of motion.  Neurological: Alert, no facial droop, fluent speech, moves all extremities symmetrically Skin: Skin is warm and dry.  Psychiatric: Cooperative  ED Course  Procedures (including critical care time) Labs Review Labs Reviewed  BASIC METABOLIC PANEL - Abnormal; Notable for the following:    Glucose, Bld 105 (*)    BUN 21 (*)    All other components within normal limits  URINALYSIS, ROUTINE W REFLEX MICROSCOPIC (NOT AT [facility]) - Abnormal; Notable for the following:    APPearance CLOUDY (*)    All other components within normal limits  CBG MONITORING, ED - Abnormal; Notable for the following:    Glucose-Capillary 103 (*)    All other components within normal limits  CBC  I-STAT TROPOININ, ED  Randolm Idol, ED    Imaging Review Dg Chest 2 View  07/18/2015   CLINICAL DATA:  Chest pain  EXAM: CHEST  2 VIEW  COMPARISON:  08/30/2013  FINDINGS: Cardiomediastinal silhouette is stable. Thoracic dextroscoliosis again noted. Again noted chronic elevation of the right hemidiaphragm. Stable bilateral basilar scarring. No acute infiltrate or pulmonary edema.  IMPRESSION: No active disease. Chronic elevation of the right hemidiaphragm. Stable bilateral basilar scarring.   Electronically Signed   By: Lahoma Crocker M.D.   On: 07/18/2015 16:39   I have personally reviewed and evaluated these images and lab results as part of my medical decision-making.   EKG Interpretation   Date/Time:  Wednesday July 19 2015 00:03:54 EDT Ventricular Rate:  75 PR Interval:  179 QRS Duration: 120 QT Interval:  392 QTC Calculation: 438 R Axis:   -67 Text Interpretation:  Sinus rhythm Atrial premature complex Incomplete  RBBB and LAFB No significant change since last tracing Confirmed by Rozanne Heumann  MD, Hinton Dyer ([id number]) on 07/19/2015 12:12:47 AM       MDM   Final diagnoses:  Near syncope    81 year old male with history of CKD who presents with near syncope. He is asymptomatic and reports feeling at baseline on arrival. EKG shows Incomplete RBBB and LAFB without stigmata of arrhythmia. This is unchanged from prior EKGS. No events noted on telemetry. Serial troponin is negative and repeat EKG is negative. Does not seem consistent with ACS in nature, and ACS score of 3, primarily for age and non-specific EKG. CXR showing no widened mediastinum or other acute processes, and clinical history not consistent with dissection of PE. Given ECHO two years ago, valvular disease such as AS unlikely and EF was 65% then. Discussed with patient and his son about potential care plans. They have expressed that they will call his PCP in the morning to discuss repeat ECHO and holter monitoring and follow-up within 1-2 days period. They will return for any recurrent  or worsening symptoms.     Forde Dandy, MD 07/19/15 [phone]

## 2015-07-18 NOTE — ED Notes (Signed)
Pt states that since around 1pm today he has had "uneasiness in my chest and feel like i am going to pass out".  Pt states that he "just dont feel normal.

## 2015-07-19 DIAGNOSIS — R55 Syncope and collapse: Secondary | ICD-10-CM | POA: Diagnosis not present

## 2015-07-19 LAB — I-STAT TROPONIN, ED: Troponin i, poc: 0 ng/mL (ref 0.00–0.08)

## 2015-07-19 NOTE — Discharge Instructions (Signed)
Return without fail for worsening symptoms, including worsening pain, sweating, vomiting and unable to keep down food/fluids, passing out, difficulty breathing, or any other symptoms concerning to you.  Near-Syncope Near-syncope (commonly known as near fainting) is sudden weakness, dizziness, or feeling like you might pass out. During an episode of near-syncope, you may also develop pale skin, have tunnel vision, or feel sick to your stomach (nauseous). Near-syncope may occur when getting up after sitting or while standing for a long time. It is caused by a sudden decrease in blood flow to the brain. This decrease can result from various causes or triggers, most of which are not serious. However, because near-syncope can sometimes be a sign of something serious, a medical evaluation is required. The specific cause is often not determined. HOME CARE INSTRUCTIONS  Monitor your condition for any changes. The following actions may help to alleviate any discomfort you are experiencing:  Have someone stay with you until you feel stable.  Lie down right away and prop your feet up if you start feeling like you might faint. Breathe deeply and steadily. Wait until all the symptoms have passed. Most of these episodes last only a few minutes. You may feel tired for several hours.   Drink enough fluids to keep your urine clear or pale yellow.   If you are taking blood pressure or heart medicine, get up slowly when seated or lying down. Take several minutes to sit and then stand. This can reduce dizziness.  Follow up with your health care provider as directed. SEEK IMMEDIATE MEDICAL CARE IF:   You have a severe headache.   You have unusual pain in the chest, abdomen, or back.   You are bleeding from the mouth or rectum, or you have black or tarry stool.   You have an irregular or very fast heartbeat.   You have repeated fainting or have seizure-like jerking during an episode.   You faint when  sitting or lying down.   You have confusion.   You have difficulty walking.   You have severe weakness.   You have vision problems.  MAKE SURE YOU:   Understand these instructions.  Will watch your condition.  Will get help right away if you are not doing well or get worse. Document Released: 11/18/2005 Document Revised: 11/23/2013 Document Reviewed: 04/23/2013 [facility] Patient Information 2015 [place], [place]. This information is not intended to replace advice given to you by your health care provider. Make sure you discuss any questions you have with your health care provider.

## 2015-07-21 DIAGNOSIS — Z6823 Body mass index (BMI) 23.0-23.9, adult: Secondary | ICD-10-CM | POA: Diagnosis not present

## 2015-07-21 DIAGNOSIS — R55 Syncope and collapse: Secondary | ICD-10-CM | POA: Diagnosis not present

## 2015-07-21 DIAGNOSIS — G2581 Restless legs syndrome: Secondary | ICD-10-CM | POA: Diagnosis not present

## 2015-07-21 DIAGNOSIS — R0789 Other chest pain: Secondary | ICD-10-CM | POA: Diagnosis not present

## 2015-07-29 ENCOUNTER — Observation Stay (HOSPITAL_COMMUNITY)
Admission: EM | Admit: 2015-07-29 | Discharge: 2015-07-30 | Disposition: A | Discharge status: Home / Self Care | Payer: Medicare Other | Attending: Internal Medicine | Admitting: Internal Medicine

## 2015-07-29 ENCOUNTER — Emergency Department (HOSPITAL_COMMUNITY): Payer: Medicare Other

## 2015-07-29 ENCOUNTER — Encounter (HOSPITAL_COMMUNITY): Payer: Self-pay | Admitting: Emergency Medicine

## 2015-07-29 DIAGNOSIS — Z7952 Long term (current) use of systemic steroids: Secondary | ICD-10-CM | POA: Insufficient documentation

## 2015-07-29 DIAGNOSIS — R0602 Shortness of breath: Secondary | ICD-10-CM | POA: Diagnosis not present

## 2015-07-29 DIAGNOSIS — H5442 Blindness, left eye, normal vision right eye: Secondary | ICD-10-CM | POA: Diagnosis not present

## 2015-07-29 DIAGNOSIS — G8929 Other chronic pain: Secondary | ICD-10-CM | POA: Diagnosis not present

## 2015-07-29 DIAGNOSIS — I452 Bifascicular block: Secondary | ICD-10-CM | POA: Diagnosis present

## 2015-07-29 DIAGNOSIS — Z79891 Long term (current) use of opiate analgesic: Secondary | ICD-10-CM | POA: Diagnosis not present

## 2015-07-29 DIAGNOSIS — Z8546 Personal history of malignant neoplasm of prostate: Secondary | ICD-10-CM | POA: Diagnosis not present

## 2015-07-29 DIAGNOSIS — Z79899 Other long term (current) drug therapy: Secondary | ICD-10-CM | POA: Insufficient documentation

## 2015-07-29 DIAGNOSIS — G4733 Obstructive sleep apnea (adult) (pediatric): Secondary | ICD-10-CM | POA: Diagnosis not present

## 2015-07-29 DIAGNOSIS — Z905 Acquired absence of kidney: Secondary | ICD-10-CM

## 2015-07-29 DIAGNOSIS — R079 Chest pain, unspecified: Secondary | ICD-10-CM | POA: Diagnosis present

## 2015-07-29 DIAGNOSIS — Z9989 Dependence on other enabling machines and devices: Secondary | ICD-10-CM

## 2015-07-29 DIAGNOSIS — N189 Chronic kidney disease, unspecified: Secondary | ICD-10-CM | POA: Diagnosis not present

## 2015-07-29 DIAGNOSIS — I454 Nonspecific intraventricular block: Secondary | ICD-10-CM | POA: Diagnosis not present

## 2015-07-29 DIAGNOSIS — G2581 Restless legs syndrome: Secondary | ICD-10-CM | POA: Diagnosis not present

## 2015-07-29 DIAGNOSIS — G894 Chronic pain syndrome: Secondary | ICD-10-CM | POA: Diagnosis not present

## 2015-07-29 DIAGNOSIS — R0789 Other chest pain: Principal | ICD-10-CM | POA: Insufficient documentation

## 2015-07-29 DIAGNOSIS — M5136 Other intervertebral disc degeneration, lumbar region: Secondary | ICD-10-CM | POA: Diagnosis not present

## 2015-07-29 LAB — BASIC METABOLIC PANEL
ANION GAP: 6 (ref 5–15)
BUN: 23 mg/dL — ABNORMAL HIGH (ref 6–20)
CHLORIDE: 108 mmol/L (ref 101–111)
CO2: 26 mmol/L (ref 22–32)
CREATININE: 0.84 mg/dL (ref 0.61–1.24)
Calcium: 9.1 mg/dL (ref 8.9–10.3)
GFR calc non Af Amer: >60 mL/min (ref >60)
Glucose, Bld: 109 mg/dL — ABNORMAL HIGH (ref 65–99)
Potassium: 4.3 mmol/L (ref 3.5–5.1)
SODIUM: 140 mmol/L (ref 135–145)

## 2015-07-29 LAB — CBC
HCT: 44.3 % (ref 39.0–52.0)
HEMOGLOBIN: 14.3 g/dL (ref 13.0–17.0)
MCH: 28.7 pg (ref 26.0–34.0)
MCHC: 32.3 g/dL (ref 30.0–36.0)
MCV: 89 fL (ref 78.0–100.0)
PLATELETS: 154 10*3/uL (ref 150–400)
RBC: 4.98 MIL/uL (ref 4.22–5.81)
RDW: 13.1 % (ref 11.5–15.5)
WBC: 8.3 10*3/uL (ref 4.0–10.5)

## 2015-07-29 LAB — I-STAT TROPONIN, ED: TROPONIN I, POC: 0 ng/mL (ref 0.00–0.08)

## 2015-07-29 LAB — TROPONIN I: Troponin I: <0.03 ng/mL (ref <0.031)

## 2015-07-29 MED ORDER — SODIUM CHLORIDE 0.9 % IJ SOLN
3.0000 mL | Freq: Two times a day (BID) | INTRAMUSCULAR | Status: DC
  Administered 2015-07-29 – 2015-07-30 (×2): 3 mL via INTRAVENOUS

## 2015-07-29 MED ORDER — PREGABALIN 75 MG PO CAPS
75.0000 mg | ORAL_CAPSULE | Freq: Two times a day (BID) | ORAL | Status: DC
  Administered 2015-07-29 – 2015-07-30 (×2): 75 mg via ORAL
  Filled 2015-07-29 (×2): qty 1

## 2015-07-29 MED ORDER — CLOBETASOL PROPIONATE 0.05 % EX CREA
1.0000 "application " | TOPICAL_CREAM | Freq: Two times a day (BID) | CUTANEOUS | Status: DC
  Administered 2015-07-29 – 2015-07-30 (×2): 1 via TOPICAL
  Filled 2015-07-29: qty 15

## 2015-07-29 MED ORDER — DOCUSATE SODIUM 100 MG PO CAPS: 100.0000 mg | ORAL_CAPSULE | Freq: Every day | ORAL | Status: DC | PRN

## 2015-07-29 MED ORDER — ENOXAPARIN SODIUM 40 MG/0.4ML ~~LOC~~ SOLN
40.0000 mg | SUBCUTANEOUS | Status: DC
  Administered 2015-07-29: 40 mg via SUBCUTANEOUS
  Filled 2015-07-29 (×2): qty 0.4

## 2015-07-29 MED ORDER — ONDANSETRON HCL 4 MG/2ML IJ SOLN: 4.0000 mg | Freq: Three times a day (TID) | INTRAMUSCULAR | Status: AC | PRN

## 2015-07-29 MED ORDER — OXYCODONE-ACETAMINOPHEN 5-325 MG PO TABS
1.0000 | ORAL_TABLET | Freq: Two times a day (BID) | ORAL | Status: DC | PRN
Start: 2015-07-29 — End: 2015-07-30

## 2015-07-29 MED ORDER — POLYETHYLENE GLYCOL 3350 17 G PO PACK
17.0000 g | PACK | Freq: Every day | ORAL | Status: DC | PRN
  Administered 2015-07-29: 17 g via ORAL
  Filled 2015-07-29: qty 1

## 2015-07-29 MED ORDER — MORPHINE SULFATE ER 100 MG PO TBCR
100.0000 mg | EXTENDED_RELEASE_TABLET | Freq: Two times a day (BID) | ORAL | Status: DC
  Administered 2015-07-29 – 2015-07-30 (×2): 100 mg via ORAL
  Filled 2015-07-29 (×2): qty 1

## 2015-07-29 MED ORDER — OXYMORPHONE HCL ER 40 MG PO TB12: 40.0000 mg | ORAL_TABLET | Freq: Two times a day (BID) | ORAL | Status: DC

## 2015-07-29 NOTE — ED Notes (Signed)
Patient transported to X-ray 

## 2015-07-29 NOTE — ED Notes (Signed)
MD at bedside. 

## 2015-07-29 NOTE — H&P (Signed)
History and Physical   Roen Macgowan DXA:[id number] DOB: 29-Aug-1926 DOA: 07/29/2015  Referring physician: Dr. Sabra Heck PCP: Velna Hatchet, MD  Specialists: none  Chief Complaint: chest discomfort  HPI: Kailash Hinze is a 81 y.o. male has a past medical history significant for chronic pain, history of prostate cancer status post prostatectomy, history of right nephrectomy, obstructive sleep apnea, presents to the emergency room with a chief compliant of chest discomfort. Patient has had 2 episodes in the past couple weeks of left sided chest and left-sided abdominal discomfort, he denies pain per se but describes this as a very uncomfortable feeling. His discomfort did not radiate anywhere. This was associated with shortness of breath, no wheezing but states he was more like didn't have enough air. Last episode was about 11 days ago when he came to the ED and was discharged back home. That one was associated with lightheadedness and felt like he almost passed out, but this time around didn't have that. His episodes started while he was at rest, and lasted for about an hour before subsiding. He currently denies any discomfort or breathing difficulties. He denies any abdominal pain, nausea, vomiting or diarrhea. He denies any fever or chills. He has no lightheadedness or dizziness with this episode. He was supposed to see a cardiologist on September 6 and have a 2-D echo done at that time. In the emergency room, patient's vital signs were stable, his BMP and CBC are fairly unremarkable, his troponin is negative and his EKG looks similar to his prior EKGs. TRH was asked for admission for chest pain rule out.  Review of Systems: As per history of present illness, otherwise 10 point review of systems negative  Past Medical History  Diagnosis Date  . Blind left eye   . Restless leg   . Asthma     AS A CHILD  . Chronic kidney disease   . Cancer     PROSTATE CANCER  . DDD (degenerative disc disease),  lumbar   . Chronic pain syndrome   . Insomnia    Past Surgical History  Procedure Laterality Date  . Prostatectomy    . Rigth nephrectomy  2006  . Right cataract extraction  2013  . Tonsillectomy    . Transurethral resection of prostate  1997  . Cholecystectomy     Social History:  reports that he has never smoked. He has never used smokeless tobacco. He reports that he does not drink alcohol or use illicit drugs.  Allergies  Allergen Reactions  . Peanuts [Peanut Oil] Other (See Comments)    Shortness of breath---all nuts, but can have peanuts, cashews, and almonds  . Coconut Oil     Unknown childhood reaction.      Family History  Problem Relation Age of Onset  . Bone cancer Mother   . Hypertension Mother   . Arthritis Mother     Prior to Admission medications   Medication Sig Start Date End Date Taking? Authorizing Provider  clobetasol cream (TEMOVATE) 2.09 % Apply 1 application topically 2 (two) times daily. 07/14/15  Yes Historical Provider, MD  docusate sodium (COLACE) 100 MG capsule Take 100 mg by mouth daily as needed for mild constipation or moderate constipation.    Yes Historical Provider, MD  gabapentin (NEURONTIN) 300 MG capsule Take 300 mg by mouth 2 (two) times daily.   Yes Historical Provider, MD  oxyCODONE-acetaminophen (PERCOCET/ROXICET) 5-325 MG per tablet Take 1 tablet by mouth 2 (two) times daily as needed [facility]  PAIN).  07/07/15  Yes Historical Provider, MD  oxymorphone (OPANA ER) 40 MG 12 hr tablet Take 40 mg by mouth every 12 (twelve) hours.   Yes Historical Provider, MD  polyethylene glycol (MIRALAX / GLYCOLAX) packet Take 17 g by mouth daily as needed for moderate constipation. For constipation   Yes Historical Provider, MD  pregabalin (LYRICA) 75 MG capsule Take 75 mg by mouth 2 (two) times daily.   Yes Historical Provider, MD  doxylamine, Sleep, (UNISOM) 25 MG tablet Take 1 tablet (25 mg total) by mouth at bedtime as needed. Patient not taking:  Reported on 07/29/2015 12/21/14   Larey Seat, MD  Melatonin 5 MG TABS OTC, 30 minutes prior to bedtime. Patient not taking: Reported on 07/10/2015 12/21/14   Larey Seat, MD   Physical Exam: Filed Vitals:   07/29/15 1505  BP: 130/73  Pulse: 70  Resp: 21  SpO2: 93%     GENERAL: NAD  HEENT: head NCAT, no scleral icterus. Pupils round and reactive. Mucous membranes are moist. Posterior pharynx clear of any exudate or lesions.   NECK: Supple.  LUNGS: Clear to auscultation. No wheezing or crackles  HEART: Regular rate and rhythm without murmur. 2+ pulses, no JVD, no peripheral edema  ABDOMEN: Soft, nontender, and nondistended. Positive bowel sounds.   NEUROLOGIC: Alert and oriented x3. Cranial nerves II through XII are grossly intact. Strength 5/5 in all 4.  PSYCHIATRIC: Normal mood and affect  SKIN: No ulceration or induration present.   Labs on Admission:  Basic Metabolic Panel:  Recent Labs Lab 07/29/15 1540  NA 140  K 4.3  CL 108  CO2 26  GLUCOSE 109*  BUN 23*  CREATININE 0.84  CALCIUM 9.1   CBC:  Recent Labs Lab 07/29/15 1540  WBC 8.3  HGB 14.3  HCT 44.3  MCV 89.0  PLT 154   Radiological Exams on Admission: Dg Chest 2 View  07/29/2015   CLINICAL DATA:  81 year old male with left-sided chest tightness and shortness of breath over the past several hours.  EXAM: CHEST  2 VIEW  COMPARISON:  Chest x-ray 07/18/2015.  FINDINGS: Lung volumes appear normal. No consolidative airspace disease. No pleural effusions. Linear opacities in the left lung base, similar to prior examinations, presumably chronic scarring. Chronic elevation of the right hemidiaphragm is unchanged. No evidence of pulmonary edema. Heart size is normal. Upper mid sternal contours are within normal limits. Atherosclerosis in the thoracic aorta. Surgical clips project over the right upper quadrant of the abdomen, likely from prior cholecystectomy. Dextroscoliosis of the thoracic spine.   IMPRESSION: 1. No radiographic evidence of acute cardiopulmonary disease. The appearance of the chest is unchanged, as detailed above.   Electronically Signed   By: Vinnie Langton M.D.   On: 07/29/2015 15:21    EKG: Independently reviewed. LAFB, sinus rhythm, simila to prior tracings  Assessment/Plan Active Problems:   Chronic pain   H/O unilateral nephrectomy   H/O prostate cancer   Bifascicular bundle branch block   OSA on CPAP   Chest pain   Chest pain - with atypical components, will admit patient to telemetry, cycle cardiac enzymes overnight - obtain 2D echo - if enzymes or 2D echo are abnormal will consult cardiology, otherwise he has an appointment with Dr. Irish Lack on 9/6. - repeat EKG in am  Chronic pain - resume home medications  OSA - nightly CPAP    Diet: heart healthy Fluids: NS DVT Prophylaxis: Lovenox  Code Status: Full  Family Communication: d/w  son bedside  Disposition Plan: admit to tele    Buford Bremer M. Cruzita Lederer, MD [facility] Pager [phone]  If 7PM-7AM, please contact night-coverage [email or web address] Password [facility] 07/29/2015, 5:24 PM

## 2015-07-29 NOTE — ED Provider Notes (Signed)
CSN: [id number]     Arrival date & time 07/29/15  1450 History   First MD Initiated Contact with Patient 07/29/15 1638     Chief Complaint  Patient presents with  . Chest Pain  . Shortness of Breath     (Consider location/radiation/quality/duration/timing/severity/associated sxs/prior Treatment) HPI Comments: The patient is an 81 year old male, history of chronic pain, prostate cancer, chronic kidney disease, denies hypertension diabetes, hypercholesterolemia or known heart disease. He has recently been started on CPAP for his sleep apnea, he is using it more and more however twice over the last 2 weeks he has developed significant left-sided discomfort with associated shortness of breath both times it resolved very well. Today when it occurred it was involving the left side of the chest, it was a vague discomfort associated with difficulty breathing, it has since improved significantly. He is still having mild symptoms. He denies fevers chills nausea vomiting diarrhea or swelling of the legs. He was told come back to the hospital if his symptoms worsened as he was not admitted to the hospital and has not seen his cardiologist in follow-up though he does have a follow-up scheduled for an echocardiogram according to the son.  Patient is a 81 y.o. male presenting with chest pain and shortness of breath. The history is provided by the patient, medical records and a relative.  Chest Pain Associated symptoms: shortness of breath   Shortness of Breath Associated symptoms: chest pain     Past Medical History  Diagnosis Date  . Blind left eye   . Restless leg   . Asthma     AS A CHILD  . Chronic kidney disease   . Cancer     PROSTATE CANCER  . DDD (degenerative disc disease), lumbar   . Chronic pain syndrome   . Insomnia    Past Surgical History  Procedure Laterality Date  . Prostatectomy    . Rigth nephrectomy  2006  . Right cataract extraction  2013  . Tonsillectomy    .  Transurethral resection of prostate  1997  . Cholecystectomy     Family History  Problem Relation Age of Onset  . Bone cancer Mother   . Hypertension Mother   . Arthritis Mother    Social History  Substance Use Topics  . Smoking status: Never Smoker   . Smokeless tobacco: Never Used  . Alcohol Use: No     Comment: QUIT ALCOHOL AT AGE 4    Review of Systems  Respiratory: Positive for shortness of breath.   Cardiovascular: Positive for chest pain.  All other systems reviewed and are negative.     Allergies  Peanuts and Coconut oil  Home Medications   Prior to Admission medications   Medication Sig Start Date End Date Taking? Authorizing Provider  clobetasol cream (TEMOVATE) 4.70 % Apply 1 application topically 2 (two) times daily. 07/14/15  Yes Historical Provider, MD  docusate sodium (COLACE) 100 MG capsule Take 100 mg by mouth daily as needed for mild constipation or moderate constipation.    Yes Historical Provider, MD  gabapentin (NEURONTIN) 300 MG capsule Take 300 mg by mouth 2 (two) times daily.   Yes Historical Provider, MD  oxyCODONE-acetaminophen (PERCOCET/ROXICET) 5-325 MG per tablet Take 1 tablet by mouth 2 (two) times daily as needed (BREAKTHROUGH PAIN).  07/07/15  Yes Historical Provider, MD  oxymorphone (OPANA ER) 40 MG 12 hr tablet Take 40 mg by mouth every 12 (twelve) hours.   Yes Historical Provider, MD  polyethylene glycol (MIRALAX / GLYCOLAX) packet Take 17 g by mouth daily as needed for moderate constipation. For constipation   Yes Historical Provider, MD  pregabalin (LYRICA) 75 MG capsule Take 75 mg by mouth 2 (two) times daily.   Yes Historical Provider, MD  doxylamine, Sleep, (UNISOM) 25 MG tablet Take 1 tablet (25 mg total) by mouth at bedtime as needed. Patient not taking: Reported on 07/29/2015 12/21/14   Larey Seat, MD  Melatonin 5 MG TABS OTC, 30 minutes prior to bedtime. Patient not taking: Reported on 07/10/2015 12/21/14   Asencion Partridge Dohmeier, MD   BP  130/73 mmHg  Pulse 70  Resp 21  SpO2 93% Physical Exam  Constitutional: He appears well-developed and well-nourished. No distress.  HENT:  Head: Normocephalic and atraumatic.  Mouth/Throat: Oropharynx is clear and moist. No oropharyngeal exudate.  Eyes: Conjunctivae and EOM are normal. Pupils are equal, round, and reactive to light. Right eye exhibits no discharge. Left eye exhibits no discharge. No scleral icterus.  Neck: Normal range of motion. Neck supple. No JVD present. No thyromegaly present.  Cardiovascular: Normal rate, regular rhythm, normal heart sounds and intact distal pulses.  Exam reveals no gallop and no friction rub.   No murmur heard. Pulmonary/Chest: Effort normal and breath sounds normal. No respiratory distress. He has no wheezes. He has no rales.  Abdominal: Soft. Bowel sounds are normal. He exhibits no distension and no mass. There is no tenderness.  Musculoskeletal: Normal range of motion. He exhibits no edema or tenderness.  Lymphadenopathy:    He has no cervical adenopathy.  Neurological: He is alert. Coordination normal.  Skin: Skin is warm and dry. No rash noted. No erythema.  Psychiatric: He has a normal mood and affect. His behavior is normal.  Nursing note and vitals reviewed.   ED Course  Procedures (including critical care time) Labs Review Labs Reviewed  BASIC METABOLIC PANEL - Abnormal; Notable for the following:    Glucose, Bld 109 (*)    BUN 23 (*)    All other components within normal limits  CBC  I-STAT TROPOININ, ED    Imaging Review Dg Chest 2 View  07/29/2015   CLINICAL DATA:  81 year old male with left-sided chest tightness and shortness of breath over the past several hours.  EXAM: CHEST  2 VIEW  COMPARISON:  Chest x-ray 07/18/2015.  FINDINGS: Lung volumes appear normal. No consolidative airspace disease. No pleural effusions. Linear opacities in the left lung base, similar to prior examinations, presumably chronic scarring. Chronic  elevation of the right hemidiaphragm is unchanged. No evidence of pulmonary edema. Heart size is normal. Upper mid sternal contours are within normal limits. Atherosclerosis in the thoracic aorta. Surgical clips project over the right upper quadrant of the abdomen, likely from prior cholecystectomy. Dextroscoliosis of the thoracic spine.  IMPRESSION: 1. No radiographic evidence of acute cardiopulmonary disease. The appearance of the chest is unchanged, as detailed above.   Electronically Signed   By: Vinnie Langton M.D.   On: 07/29/2015 15:21   I have personally reviewed and evaluated these images and lab results as part of my medical decision-making.   EKG Interpretation   Date/Time:  Saturday July 29 2015 15:01:08 EDT Ventricular Rate:  68 PR Interval:  170 QRS Duration: 114 QT Interval:  396 QTC Calculation: 421 R Axis:   -70 Text Interpretation:  Sinus rhythm Incomplete RBBB and LAFB Abnormal  R-wave progression, late transition Abnormal ekg since last tracing no  significant change Confirmed by [facility]  MD, Aaron Edelman ([id number]) on 07/29/2015  4:00:31 PM      MDM   Final diagnoses:  Chest pain, unspecified chest pain type    I have personally viewed and interpreted the imaging and agree with radiologist interpretation.  Labs unremarkable, consider ACS as he has ongoing sx, no tachy or resp distress to suggest PE.  ECG unchanged but with LAFB - concern for angina / ACS, needs admission for r/o.   D/w hospitalist who will see pt in th eED and admit - holding orders written  Noemi Chapel, MD 07/29/15 [phone]

## 2015-07-29 NOTE — ED Notes (Addendum)
Pt states that he has left side chest tightness and shob that started around lunch time today.  Pt states that tightness is now wore off.  Pt states that he was seen here 11 days ago for similar thing.  Pt states that he now wearing a CPAP at night and thinks that he supposed to be wearing it for past three months but states that he has been using it more regularly over the past 2 weeks, at least for 4 hours at a time.

## 2015-07-30 ENCOUNTER — Observation Stay (HOSPITAL_COMMUNITY): Payer: Medicare Other

## 2015-07-30 DIAGNOSIS — R079 Chest pain, unspecified: Secondary | ICD-10-CM | POA: Diagnosis not present

## 2015-07-30 DIAGNOSIS — R0789 Other chest pain: Secondary | ICD-10-CM | POA: Diagnosis not present

## 2015-07-30 LAB — TROPONIN I
Troponin I: <0.03 ng/mL (ref <0.031)
Troponin I: <0.03 ng/mL (ref <0.031)

## 2015-07-30 NOTE — Discharge Summary (Signed)
Physician Discharge Summary  Casey Gates JKK:[id number] DOB: 1926/09/05 DOA: 07/29/2015  PCP: Velna Hatchet, MD  Admit date: 07/29/2015 Discharge date: 07/30/2015  Time spent: > 35 minutes  Recommendations for Outpatient Follow-up:  1. Follow up with Dr. Irish Lack in 9 days as scheduled 2. Follow up with Dr. Ardeth Perfect   Discharge Diagnoses:  Active Problems:   Chronic pain   H/O unilateral nephrectomy   H/O prostate cancer   Bifascicular bundle branch block   OSA on CPAP   Chest pain  Discharge Condition: stable  Diet recommendation: regular  Filed Weights   07/29/15 1832  Weight: 65.137 kg (143 lb 9.6 oz)   History of present illness:  Casey Gates is a 81 y.o. male has a past medical history significant for chronic pain, history of prostate cancer status post prostatectomy, history of right nephrectomy, obstructive sleep apnea, presents to the emergency room with a chief compliant of chest discomfort. Patient has had 2 episodes in the past couple weeks of left sided chest and left-sided abdominal discomfort, he denies pain per se but describes this as a very uncomfortable feeling. His discomfort did not radiate anywhere. This was associated with shortness of breath, no wheezing but states he was more like didn't have enough air. Last episode was about 11 days ago when he came to the ED and was discharged back home. That one was associated with lightheadedness and felt like he almost passed out, but this time around didn't have that. His episodes started while he was at rest, and lasted for about an hour before subsiding. He currently denies any discomfort or breathing difficulties. He denies any abdominal pain, nausea, vomiting or diarrhea. He denies any fever or chills. He has no lightheadedness or dizziness with this episode. He was supposed to see a cardiologist on September 6 and have a 2-D echo done at that time. In the emergency room, patient's vital signs were stable, his  BMP and CBC are fairly unremarkable, his troponin is negative and his EKG looks similar to his prior EKGs. TRH was asked for admission for chest pain rule out.  Hospital Course:  Patient was admitted to the hospital with 2 recent episodes of atypical left sided chest and left sided abdominal pain that self resolved after about an hour. He was monitored on telemetry, his cardiac enzymes were cycled x 3 and remained negative. Patient underwent a 2D echo which showed good systolic function and grade 1 DD. Patient was chest pain free. Given normal echocardiogram, atypical components to his chest pain and negative cardiac enzymes, he will be discharged home in stable condition with outpatient cardiology follow up in 9 days.    Procedures:  2D echo  Study Conclusions - Left ventricle: The cavity size was normal. There was mildconcentric hypertrophy. Systolic function was vigorous. Theestimated ejection fraction was in the range of 75% to 80%. Wallmotion was normal; there were no regional wall motionabnormalities. Doppler parameters are consistent with abnormalleft ventricular relaxation (grade 1 diastolic dysfunction). - Aortic valve: There was mild regurgitation.   Consultations:  None   Discharge Exam: Filed Vitals:   07/29/15 1832 07/29/15 2205 07/29/15 2245 07/30/15 0630  BP: 143/66  123/61 121/53  Pulse: 65 72 64 54  Temp: 98.3 F (36.8 C)  97.8 F (36.6 C) 97.5 F (36.4 C)  TempSrc: Oral  Oral Oral  Resp: 12 16 16 12   Height: 5\' 9"  (1.753 m)     Weight: 65.137 kg (143 lb 9.6 oz)  SpO2: 98% 95% 96% 99%   General: NAD Cardiovascular: RRR Respiratory: CTA biL  Discharge Instructions    Medication List    TAKE these medications        clobetasol cream 0.05 %  Commonly known as:  TEMOVATE  Apply 1 application topically 2 (two) times daily.     docusate sodium 100 MG capsule  Commonly known as:  COLACE  Take 100 mg by mouth daily as needed for mild constipation or  moderate constipation.     doxylamine (Sleep) 25 MG tablet  Commonly known as:  UNISOM  Take 1 tablet (25 mg total) by mouth at bedtime as needed.     gabapentin 300 MG capsule  Commonly known as:  NEURONTIN  Take 300 mg by mouth 2 (two) times daily.     Melatonin 5 MG Tabs  OTC, 30 minutes prior to bedtime.     oxyCODONE-acetaminophen 5-325 MG per tablet  Commonly known as:  PERCOCET/ROXICET  Take 1 tablet by mouth 2 (two) times daily as needed (BREAKTHROUGH PAIN).     oxymorphone 40 MG 12 hr tablet  Commonly known as:  OPANA ER  Take 40 mg by mouth every 12 (twelve) hours.     polyethylene glycol packet  Commonly known as:  MIRALAX / GLYCOLAX  Take 17 g by mouth daily as needed for moderate constipation. For constipation     pregabalin 75 MG capsule  Commonly known as:  LYRICA  Take 75 mg by mouth 2 (two) times daily.           Follow-up Information    Follow up with Velna Hatchet, MD. Schedule an appointment as soon as possible for a visit in 1 month.   Specialty:  Internal Medicine   Contact information:   [place] [address] [phone]       Follow up with [name]., MD In 9 days.   Specialties:  Cardiology, Radiology, Interventional Cardiology   Why:  as scheduled   Contact information:   [address] [place] [address] [phone]       The results of significant diagnostics from this hospitalization (including imaging, microbiology, ancillary and laboratory) are listed below for reference.    Significant Diagnostic Studies: Dg Chest 2 View  07/29/2015   CLINICAL DATA:  81 year old male with left-sided chest tightness and shortness of breath over the past several hours.  EXAM: CHEST  2 VIEW  COMPARISON:  Chest x-ray 07/18/2015.  FINDINGS: Lung volumes appear normal. No consolidative airspace disease. No pleural effusions. Linear opacities in the left lung base, similar to prior examinations,  presumably chronic scarring. Chronic elevation of the right hemidiaphragm is unchanged. No evidence of pulmonary edema. Heart size is normal. Upper mid sternal contours are within normal limits. Atherosclerosis in the thoracic aorta. Surgical clips project over the right upper quadrant of the abdomen, likely from prior cholecystectomy. Dextroscoliosis of the thoracic spine.  IMPRESSION: 1. No radiographic evidence of acute cardiopulmonary disease. The appearance of the chest is unchanged, as detailed above.   Electronically Signed   By: Vinnie Langton M.D.   On: 07/29/2015 15:21   Dg Chest 2 View  07/18/2015   CLINICAL DATA:  Chest pain  EXAM: CHEST  2 VIEW  COMPARISON:  08/30/2013  FINDINGS: Cardiomediastinal silhouette is stable. Thoracic dextroscoliosis again noted. Again noted chronic elevation of the right hemidiaphragm. Stable bilateral basilar scarring. No acute infiltrate or pulmonary edema.  IMPRESSION: No active disease.  Chronic elevation of the right hemidiaphragm. Stable bilateral basilar scarring.   Electronically Signed   By: Lahoma Crocker M.D.   On: 07/18/2015 16:39   Labs: Basic Metabolic Panel:  Recent Labs Lab 07/29/15 1540  NA 140  K 4.3  CL 108  CO2 26  GLUCOSE 109*  BUN 23*  CREATININE 0.84  CALCIUM 9.1   CBC:  Recent Labs Lab 07/29/15 1540  WBC 8.3  HGB 14.3  HCT 44.3  MCV 89.0  PLT 154   Cardiac Enzymes:  Recent Labs Lab 07/29/15 2006 07/30/15 0220 07/30/15 0750  TROPONINI <0.03 <0.03 <0.03    Signed:  Marzetta Board  [facility] 07/30/2015, 2:14 PM

## 2015-07-30 NOTE — Progress Notes (Signed)
  Echocardiogram 2D Echocardiogram has been performed.  [name] 07/30/2015, 10:04 AM

## 2015-07-30 NOTE — Progress Notes (Signed)
Patient discharged home with son, discharge instructions given and explained to patient and he verbalized understanding, denies any pain/distress. No wound noted, skin intact.  Accompanied home by son, transported to the car by staff via wheelchair.

## 2015-07-30 NOTE — Discharge Instructions (Signed)
Follow with Velna Hatchet, MD in 5-7 days  Please get a complete blood count and chemistry panel checked by your Primary MD at your next visit, and again as instructed by your Primary MD. Please get your medications reviewed and adjusted by your Primary MD.  Please request your Primary MD to go over all Hospital Tests and Procedure/Radiological results at the follow up, please get all Hospital records sent to your Prim MD by signing hospital release before you go home.  If you had Pneumonia of Lung problems at the Hospital: Please get a 2 view Chest X ray done in 6-8 weeks after hospital discharge or sooner if instructed by your Primary MD.  If you have Congestive Heart Failure: Please call your Cardiologist or Primary MD anytime you have any of the following symptoms:  1) 3 pound weight gain in 24 hours or 5 pounds in 1 week  2) shortness of breath, with or without a dry hacking cough  3) swelling in the hands, feet or stomach  4) if you have to sleep on extra pillows at night in order to breathe  Follow cardiac low salt diet and 1.5 lit/day fluid restriction.  If you have diabetes Accuchecks 4 times/day, Once in AM empty stomach and then before each meal. Log in all results and show them to your primary doctor at your next visit. If any glucose reading is under 80 or above 300 call your primary MD immediately.  If you have Seizure/Convulsions/Epilepsy: Please do not drive, operate heavy machinery, participate in activities at heights or participate in high speed sports until you have seen by Primary MD or a Neurologist and advised to do so again.  If you had Gastrointestinal Bleeding: Please ask your Primary MD to check a complete blood count within one week of discharge or at your next visit. Your endoscopic/colonoscopic biopsies that are pending at the time of discharge, will also need to followed by your Primary MD.  Get Medicines reviewed and adjusted. Please take all your  medications with you for your next visit with your Primary MD  Please request your Primary MD to go over all hospital tests and procedure/radiological results at the follow up, please ask your Primary MD to get all Hospital records sent to his/her office.  If you experience worsening of your admission symptoms, develop shortness of breath, life threatening emergency, suicidal or homicidal thoughts you must seek medical attention immediately by calling 911 or calling your MD immediately  if symptoms less severe.  You must read complete instructions/literature along with all the possible adverse reactions/side effects for all the Medicines you take and that have been prescribed to you. Take any new Medicines after you have completely understood and accpet all the possible adverse reactions/side effects.   Do not drive or operate heavy machinery when taking Pain medications.   Do not take more than prescribed Pain, Sleep and Anxiety Medications  Special Instructions: If you have smoked or chewed Tobacco  in the last 2 yrs please stop smoking, stop any regular Alcohol  and or any Recreational drug use.  Wear Seat belts while driving.  Please note You were cared for by a hospitalist during your hospital stay. If you have any questions about your discharge medications or the care you received while you were in the hospital after you are discharged, you can call the unit and asked to speak with the hospitalist on call if the hospitalist that took care of you is not available. Once  you are discharged, your primary care physician will handle any further medical issues. Please note that NO REFILLS for any discharge medications will be authorized once you are discharged, as it is imperative that you return to your primary care physician (or establish a relationship with a primary care physician if you do not have one) for your aftercare needs so that they can reassess your need for medications and monitor your  lab values.  You can reach the hospitalist office at phone [phone] or fax [phone]   If you do not have a primary care physician, you can call [phone] for a physician referral.  Activity: As tolerated with Full fall precautions use walker/cane & assistance as needed  Diet: regular  Disposition Home

## 2015-08-08 ENCOUNTER — Encounter: Payer: Self-pay | Admitting: Interventional Cardiology

## 2015-08-08 ENCOUNTER — Ambulatory Visit (INDEPENDENT_AMBULATORY_CARE_PROVIDER_SITE_OTHER): Payer: Medicare Other | Admitting: Interventional Cardiology

## 2015-08-08 VITALS — BP 138/62 | HR 68 | Ht 72.0 in | Wt 150.4 lb

## 2015-08-08 DIAGNOSIS — R079 Chest pain, unspecified: Secondary | ICD-10-CM

## 2015-08-08 DIAGNOSIS — G4733 Obstructive sleep apnea (adult) (pediatric): Secondary | ICD-10-CM | POA: Diagnosis not present

## 2015-08-08 DIAGNOSIS — Z9989 Dependence on other enabling machines and devices: Principal | ICD-10-CM

## 2015-08-08 NOTE — Progress Notes (Signed)
Patient ID: Casey Gates, male   DOB: 30-Jul-1926, 81 y.o.   MRN: [id number]     Cardiology Office Note   Date:  08/09/2015   ID:  Casey Gates, DOB [birth year], MRN [id number]  PCP:  Velna Hatchet, MD    No chief complaint on file.    Wt Readings from Last 3 Encounters:  08/08/15 150 lb 6.4 oz (68.221 kg)  07/29/15 143 lb 9.6 oz (65.137 kg)  07/10/15 151 lb (68.493 kg)       History of Present Illness: Casey Gates is a 81 y.o. male  with a history of sleep apnea. In early August, he began a 30 day test of his C Pap in which he was told that if he did not wear the CPAP for at least 4 hours a night, it would no longer be covered by insurance. This was stressful for him. In early August, he developed some left-sided chest pain about an hour after eating. It was mild. There is no associated nausea or sweating. He went to the emergency room to be checked out. His workup was negative. After this, he was walking at his usual pace. There were no exertional symptoms. He denied any chest discomfort or shortness of breath. Overall, he felt at baseline.  A few weeks later, he had a similar episode of discomfort in the left side of his chest. He went back to the emergency room and again had a negative workup. He ruled out for MI. Echocardiogram showed no left ventricular dysfunction.  He had mild LVH and evidence of diastolic dysfunction. There is mild AI. No significant structural heart abnormalities.  Since that time, he continues to perform his activities of daily living without any difficulty. His activity is limited by orthopedic issues and decreased balance. He is not limited by any chest discomfort symptoms.    Past Medical History  Diagnosis Date  . Blind left eye   . Restless leg   . Asthma     AS A CHILD  . Chronic kidney disease   . Cancer     PROSTATE CANCER  . DDD (degenerative disc disease), lumbar   . Chronic pain syndrome   . Insomnia     Past Surgical History    Procedure Laterality Date  . Prostatectomy    . Rigth nephrectomy  2006  . Right cataract extraction  2013  . Tonsillectomy    . Transurethral resection of prostate  1997  . Cholecystectomy       Current Outpatient Prescriptions  Medication Sig Dispense Refill  . clobetasol cream (TEMOVATE) 6.73 % Apply 1 application topically 2 (two) times daily.  0  . docusate sodium (COLACE) 100 MG capsule Take 100 mg by mouth daily as needed for mild constipation or moderate constipation.     . gabapentin (NEURONTIN) 300 MG capsule Take 300 mg by mouth 2 (two) times daily.    Marland Kitchen oxyCODONE-acetaminophen (PERCOCET/ROXICET) 5-325 MG per tablet Take 1 tablet by mouth 2 (two) times daily as needed (BREAKTHROUGH PAIN).   0  . oxymorphone (OPANA ER) 40 MG 12 hr tablet Take 40 mg by mouth every 12 (twelve) hours.    . polyethylene glycol (MIRALAX / GLYCOLAX) packet Take 17 g by mouth daily as needed for moderate constipation. For constipation    . pregabalin (LYRICA) 75 MG capsule Take 75 mg by mouth 2 (two) times daily.     No current facility-administered medications for this visit.    Allergies:   Peanuts  and Coconut oil    Social History:  The patient  reports that he has never smoked. He has never used smokeless tobacco. He reports that he does not drink alcohol or use illicit drugs.   Family History:  The patient's family history includes Arthritis in his mother; Bone cancer in his mother; Hypertension in his mother.    ROS:  Please see the history of present illness.   Otherwise, review of systems are positive for left-sided chest pain as noted above.   All other systems are reviewed and negative.    PHYSICAL EXAM: VS:  BP 138/62 mmHg  Pulse 68  Ht 6' (1.829 m)  Wt 150 lb 6.4 oz (68.221 kg)  BMI 20.39 kg/m2  SpO2 93% , BMI Body mass index is 20.39 kg/(m^2). GEN: Well nourished, well developed, in no acute distress HEENT: normal Neck: no JVD, carotid bruits, or masses Cardiac: RRR; no  murmurs, rubs, or gallops,no edema  Respiratory:  clear to auscultation bilaterally, normal work of breathing GI: soft, nontender, nondistended, + BS MS: no deformity or atrophy Skin: warm and dry, no rash Neuro:  Strength and sensation are intact Psych: euthymic mood, full affect   EKG:   The ekg ordered at the end of August demonstrates normal sinus rhythm with no ST segment changes   Recent Labs: 07/29/2015: BUN 23*; Creatinine, Ser 0.84; Hemoglobin 14.3; Platelets 154; Potassium 4.3; Sodium 140   Lipid Panel No results found for: CHOL, TRIG, HDL, CHOLHDL, VLDL, LDLCALC, LDLDIRECT   Other studies Reviewed: Additional studies/ records that were reviewed today with results demonstrating: Hospital records reviewed. Echo results as noted above..   ASSESSMENT AND PLAN:  1. Chest pain: Very atypical chest pain. His main risk factor for heart disease is his age and gender. He is not having any exertional symptoms. We discussed stress testing, angiography and watchful waiting. Given the fact that he has had very few symptoms over the past month, he is comfortable to just wait and see how things do. Of note, his emotional stress level has decreased since he was told his sleep apnea therapy will be approved. He was nervous about this during the chest pain episodes. We'll wait and see if he develops any other symptoms. If he does, would plan for [employer]. If not, continue preventative therapy. 2. *Continue risk factor modification    Current medicines are reviewed at length with the patient today.  The patient concerns regarding his medicines were addressed.  The following changes have been made:  No change  Labs/ tests ordered today include:  No orders of the defined types were placed in this encounter.    Recommend 150 minutes/week of aerobic exercise Low fat, low carb, high fiber diet recommended  Disposition:   FU in as needed   Teresita Madura., MD    08/09/2015 11:15 AM    [facility] [place], [place], [place]  [address] Phone: [phone]; Fax: [phone]

## 2015-08-08 NOTE — Patient Instructions (Signed)
Medication Instructions:  Same-no changes  Labwork: None  Testing/Procedures: None  Follow-Up: Your physician recommends that you schedule a follow-up appointment in: as needed       

## 2015-08-14 DIAGNOSIS — Z6823 Body mass index (BMI) 23.0-23.9, adult: Secondary | ICD-10-CM | POA: Diagnosis not present

## 2015-08-14 DIAGNOSIS — G4733 Obstructive sleep apnea (adult) (pediatric): Secondary | ICD-10-CM | POA: Diagnosis not present

## 2015-08-14 DIAGNOSIS — Z23 Encounter for immunization: Secondary | ICD-10-CM | POA: Diagnosis not present

## 2015-08-14 DIAGNOSIS — J342 Deviated nasal septum: Secondary | ICD-10-CM | POA: Diagnosis not present

## 2015-08-14 DIAGNOSIS — G4700 Insomnia, unspecified: Secondary | ICD-10-CM | POA: Diagnosis not present

## 2015-08-14 DIAGNOSIS — J302 Other seasonal allergic rhinitis: Secondary | ICD-10-CM | POA: Diagnosis not present

## 2015-08-29 ENCOUNTER — Ambulatory Visit: Payer: Self-pay | Admitting: Neurology

## 2015-08-29 ENCOUNTER — Telehealth: Payer: Self-pay

## 2015-08-29 NOTE — Telephone Encounter (Signed)
Pt did not show for their appt with Dr. Dohmeier today.  

## 2015-08-30 ENCOUNTER — Encounter: Payer: Self-pay | Admitting: Neurology

## 2015-09-05 DIAGNOSIS — L820 Inflamed seborrheic keratosis: Secondary | ICD-10-CM | POA: Diagnosis not present

## 2015-09-05 DIAGNOSIS — L309 Dermatitis, unspecified: Secondary | ICD-10-CM | POA: Diagnosis not present

## 2015-09-20 DIAGNOSIS — G894 Chronic pain syndrome: Secondary | ICD-10-CM | POA: Diagnosis not present

## 2015-09-20 DIAGNOSIS — M5136 Other intervertebral disc degeneration, lumbar region: Secondary | ICD-10-CM | POA: Diagnosis not present

## 2015-09-20 DIAGNOSIS — M4806 Spinal stenosis, lumbar region: Secondary | ICD-10-CM | POA: Diagnosis not present

## 2015-09-20 DIAGNOSIS — Z79891 Long term (current) use of opiate analgesic: Secondary | ICD-10-CM | POA: Diagnosis not present

## 2015-09-21 DIAGNOSIS — K1379 Other lesions of oral mucosa: Secondary | ICD-10-CM | POA: Diagnosis not present

## 2015-09-21 DIAGNOSIS — Z6824 Body mass index (BMI) 24.0-24.9, adult: Secondary | ICD-10-CM | POA: Diagnosis not present

## 2015-09-21 DIAGNOSIS — J029 Acute pharyngitis, unspecified: Secondary | ICD-10-CM | POA: Diagnosis not present

## 2015-09-21 DIAGNOSIS — J302 Other seasonal allergic rhinitis: Secondary | ICD-10-CM | POA: Diagnosis not present

## 2015-10-10 ENCOUNTER — Encounter: Payer: Self-pay | Admitting: Adult Health

## 2015-10-10 ENCOUNTER — Ambulatory Visit (INDEPENDENT_AMBULATORY_CARE_PROVIDER_SITE_OTHER): Payer: Medicare Other | Admitting: Adult Health

## 2015-10-10 VITALS — BP 121/68 | HR 68 | Ht 69.0 in | Wt 158.0 lb

## 2015-10-10 DIAGNOSIS — Z9989 Dependence on other enabling machines and devices: Principal | ICD-10-CM

## 2015-10-10 DIAGNOSIS — G4733 Obstructive sleep apnea (adult) (pediatric): Secondary | ICD-10-CM | POA: Diagnosis not present

## 2015-10-10 NOTE — Progress Notes (Signed)
PATIENT: Casey Gates DOB: 1926/02/02  REASON FOR VISIT: follow up- obstructive sleep apnea on CPAP HISTORY FROM: patient  HISTORY OF PRESENT ILLNESS: Casey Gates is a 81 year old male with a history of obstructive sleep apnea on CPAP and insomnia. He returns today for a compliance download. His download for the last 30 days indicates that he uses machine 28 out of 30 days for compliance of 93%. On average he uses his machine 4 hours and 13 minutes. He uses machine greater than 4 hours 19 out of 30 days for compliance of 63%. The patient's residual AHI is 25.7 on 7 cm of water with EPR of 3. The patient does have a significant leak in the 95th percentile at 47.0 L/min. Patient states that the mask tends to move around when he sleeps. He is unsure how to tighten the mask. He states that he is unable to sleep on his sides due to back pain. He denies any new medical history. He returns today for an evaluation.   HISTORY 07/10/15 [facility]): Casey Gates is a widowed , caucasian, right handed 81 y.o. male , seen here as a referral from Dr. Ardeth Perfect for an insomnia evaluation.  Casey Gates states that beginning in the second half of last year March 27, 2014 he begun having frequent nights with difficulties to initiate sleep. Sometimes he would not sleep at all most of these insomniac nights allow him for 2-3 hours of nocturnal sleep only. He also states that meanwhile this is a very common finding and affect him 2-3 times per week. He feels excessively fatigued and sleepy in daytime but at night seems to be more awake and stimulated with difficulties to initiating sleep. He is easily falling asleep when not stimulated or not physically active in daytime. To ambulate he uses a walker, uses he has a chronic back condition that begun in his 45s. Basically has lumbar degenerative disc disease and kyphoscoliosis which causes a chronic pain syndrome. He is status post prostate carcinoma with TURP procedure 03/27/96 ,and is  going to the bathroom once or twice at night only. He also complained of " restless legs ", but this seems to be a Radiculo-pathic problem, pain arises from the right groin and descends down the ant thigh , associated with back pain. He has trouble finding a comfortbale spot to sit or sleep in.His sleep habits are as follows: The patient usually goes to bed between 11PM and midnight , on a good evening he will be asleep in 15 minutes.He usually can sleep for 2-2-1/2 hours. He will wake up - not by the bathroom urge or pain or discomfort. what wakes him seems to be a spontaneous arousal. And he feels very wakeful after that and has often trouble to fall back asleep. He is not excessively worried or concerned and is not anxious, " Usually". The bedroom is cool , quiet but not dark - the building is lit from the outside. Mostly has been reading after being awake . He has no obligations to get up in AM, ususally wakes up before the aid is coming to his room. About 5-6 AM.  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 The patient is blind in his left eye, Esotopia,Has never smoked, has been a Moderate -severe Alcohol drinker but not longer ( over 5 years ).   This is an interval history from Casey Gates, from 07-10-15. Mr.  Gates split-night polysomnography from 02-14-15 documented an AHI of 51.8 and an RDI of the same number his lowest oxygen desaturation was 77% and a total time in desaturation was 66.9 minutes which is high. He was titrated to 7 cm water pressure and the AHI was 0.0. The setting was only witnessed for about 35 minutes of monitored sleep and half of the time was spent in rem sleep. The sleep efficiency increased to 97.4%. He also seemed to have had suddenly some leg kicking during the CPAP titration that was not visible before this may be a sign of poor tolerance to CPAP however it stopped before the sleep study  was and finally titrated. I have today the opportunity to look at a download of data the therapeutic data show an 87% compliance for 26 out of 30 days of use but the average user time is only 3 hours and 16 minutes. So he has improved in comparison to his last download. The set pressure is 7 cm water with 3 cm EPR but the AHI is suddenly shot up to 24 apneas that's almost 50% of what he was diagnosed with. There is a very high air leak but the patient just changed to a new mask. I wonder if he does not achieve a good seal with this mask either. He was confused about the letters R and L on the nasal pillow- I explained that these mean the nostrils right and left. We used a mask form our sleep lab and had the patient try it on and plug it into the frame.  REVIEW OF SYSTEMS: Out of a complete 14 system review of symptoms, the patient complains only of the following symptoms, and all other reviewed systems are negative.  Restless leg Epworth sleepiness score 16 Fatigue severity score 36  ALLERGIES: Allergies  Allergen Reactions  . Peanuts [Peanut Oil] Other (See Comments)    Shortness of breath---all nuts, but can have peanuts, cashews, and almonds  . Coconut Oil     Unknown childhood reaction.      HOME MEDICATIONS: Outpatient Prescriptions Prior to Visit  Medication Sig Dispense Refill  . clobetasol cream (TEMOVATE) 4.23 % Apply 1 application topically 2 (two) times daily.  0  . docusate sodium (COLACE) 100 MG capsule Take 100 mg by mouth daily as needed for mild constipation or moderate constipation.     . gabapentin (NEURONTIN) 300 MG capsule Take 300 mg by mouth 2 (two) times daily.    Marland Kitchen oxyCODONE-acetaminophen (PERCOCET/ROXICET) 5-325 MG per tablet Take 1 tablet by mouth 2 (two) times daily as needed (BREAKTHROUGH PAIN).   0  . oxymorphone (OPANA ER) 40 MG 12 hr tablet Take 40 mg by mouth every 12 (twelve) hours.    . polyethylene glycol (MIRALAX / GLYCOLAX) packet Take 17 g by mouth  daily as needed for moderate constipation. For constipation    . pregabalin (LYRICA) 75 MG capsule Take 75 mg by mouth 2 (two) times daily.     No facility-administered medications prior to visit.    PAST MEDICAL HISTORY: Past Medical History  Diagnosis Date  . Blind left eye   . Restless leg   . Asthma     AS A CHILD  . Chronic kidney disease   . Cancer [facility])     PROSTATE CANCER  . DDD (degenerative disc disease), lumbar   . Chronic pain syndrome   . Insomnia     PAST SURGICAL HISTORY: Past Surgical History  Procedure Laterality Date  .  Prostatectomy    . Rigth nephrectomy  2006  . Right cataract extraction  2013  . Tonsillectomy    . Transurethral resection of prostate  1997  . Cholecystectomy      FAMILY HISTORY: Family History  Problem Relation Age of Onset  . Bone cancer Mother   . Hypertension Mother   . Arthritis Mother     SOCIAL HISTORY: Social History   Social History  . Marital Status: Widowed    Spouse Name: N/A  . Number of Children: N/A  . Years of Education: N/A   Occupational History  . Not on file.   Social History Main Topics  . Smoking status: Never Smoker   . Smokeless tobacco: Never Used  . Alcohol Use: No     Comment: QUIT ALCOHOL AT AGE 77  . Drug Use: No  . Sexual Activity: No   Other Topics Concern  . Not on file   Social History Narrative   Caffeine 2-3 cups average.  Widowed,  Lives at [facility], [place]. Living.  Retired Forensic psychologist.  One son.      PHYSICAL EXAM  Filed Vitals:   10/10/15 1011  BP: 121/68  Pulse: 68  Height: 5\' 9"  (1.753 m)  Weight: 158 lb (71.668 kg)   Body mass index is 23.32 kg/(m^2).  Generalized: Well developed, in no acute distress  Neck: Circumference 15-1/2 inches, Mallampati 4+  Neurological examination  Mentation: Alert oriented to time, place, history taking. Follows all commands speech and language fluent Cranial nerve II-XII: Pupils were equal round reactive to light.  Extraocular movements were full, visual field were full on confrontational test. Facial sensation and strength were normal. Uvula tongue midline. Head turning and shoulder shrug  were normal and symmetric. Motor: The motor testing reveals 5 over 5 strength of all 4 extremities. Good symmetric motor tone is noted throughout.  Sensory: Sensory testing is intact to soft touch on all 4 extremities. No evidence of extinction is noted.  Coordination: Cerebellar testing reveals good finger-nose-finger and heel-to-shin bilaterally.  Gait and station: Gait is normal. Tandem gait is normal. Romberg is negative. No drift is seen.  Reflexes: Deep tendon reflexes are symmetric and normal bilaterally.   DIAGNOSTIC DATA (LABS, IMAGING, TESTING) - I reviewed patient records, labs, notes, testing and imaging myself where available.  Lab Results  Component Value Date   WBC 8.3 07/29/2015   HGB 14.3 07/29/2015   HCT 44.3 07/29/2015   MCV 89.0 07/29/2015   PLT 154 07/29/2015      Component Value Date/Time   NA 140 07/29/2015 1540   K 4.3 07/29/2015 1540   CL 108 07/29/2015 1540   CO2 26 07/29/2015 1540   GLUCOSE 109* 07/29/2015 1540   BUN 23* 07/29/2015 1540   CREATININE 0.84 07/29/2015 1540   CALCIUM 9.1 07/29/2015 1540   PROT 6.4 05/07/2013 1814   ALBUMIN 3.5 05/07/2013 1814   AST 21 05/07/2013 1814   ALT 15 05/07/2013 1814   ALKPHOS 58 05/07/2013 1814   BILITOT 0.4 05/07/2013 1814   GFRNONAA >60 07/29/2015 1540   GFRAA >60 07/29/2015 1540       ASSESSMENT AND PLAN 81 y.o. year old male  has a past medical history of Blind left eye; Restless leg; Asthma; Chronic kidney disease; Cancer ([place]); DDD (degenerative disc disease), lumbar; Chronic pain syndrome; and Insomnia. here with:  1. Obstructive sleep apnea on CPAP  The patient continues to have a significant leak and his residual AHI is elevated.  The patient will come in this week and have his mask refitted with Robin in the sleep lab.  The patient will return in 3 months for another compliance download. Patient advised that if his symptoms worsen or he develops any new symptoms he should let us know. He will follow-up in 3-4 months or sooner if needed.  Ward Givens, MSN, NP-C 10/10/2015, 10:19 AM [facility] [address] [place], [place] [address] [phone]

## 2015-10-10 NOTE — Patient Instructions (Signed)
Come see Casey Gates in the sleep lab for mask refitting.  Return in 3 months of download - bring machine and mask.

## 2015-10-11 NOTE — Progress Notes (Signed)
I agree with the assessment and plan as directed by NP .The patient is known to me .   Tenasia Aull, MD  

## 2015-10-17 DIAGNOSIS — H01001 Unspecified blepharitis right upper eyelid: Secondary | ICD-10-CM | POA: Diagnosis not present

## 2015-10-17 DIAGNOSIS — H02105 Unspecified ectropion of left lower eyelid: Secondary | ICD-10-CM | POA: Diagnosis not present

## 2015-10-17 DIAGNOSIS — H02102 Unspecified ectropion of right lower eyelid: Secondary | ICD-10-CM | POA: Diagnosis not present

## 2015-10-17 DIAGNOSIS — H01004 Unspecified blepharitis left upper eyelid: Secondary | ICD-10-CM | POA: Diagnosis not present

## 2015-10-19 DIAGNOSIS — G4733 Obstructive sleep apnea (adult) (pediatric): Secondary | ICD-10-CM | POA: Diagnosis not present

## 2015-10-19 DIAGNOSIS — R0602 Shortness of breath: Secondary | ICD-10-CM | POA: Diagnosis not present

## 2015-10-19 DIAGNOSIS — R630 Anorexia: Secondary | ICD-10-CM | POA: Diagnosis not present

## 2015-10-19 DIAGNOSIS — R05 Cough: Secondary | ICD-10-CM | POA: Diagnosis not present

## 2015-10-19 DIAGNOSIS — Z6823 Body mass index (BMI) 23.0-23.9, adult: Secondary | ICD-10-CM | POA: Diagnosis not present

## 2015-10-19 DIAGNOSIS — K5909 Other constipation: Secondary | ICD-10-CM | POA: Diagnosis not present

## 2015-10-25 DIAGNOSIS — G4733 Obstructive sleep apnea (adult) (pediatric): Secondary | ICD-10-CM | POA: Diagnosis not present

## 2015-10-25 DIAGNOSIS — R05 Cough: Secondary | ICD-10-CM | POA: Diagnosis not present

## 2015-11-02 DIAGNOSIS — M5136 Other intervertebral disc degeneration, lumbar region: Secondary | ICD-10-CM | POA: Diagnosis not present

## 2015-11-02 DIAGNOSIS — M4806 Spinal stenosis, lumbar region: Secondary | ICD-10-CM | POA: Diagnosis not present

## 2015-11-02 DIAGNOSIS — Z79891 Long term (current) use of opiate analgesic: Secondary | ICD-10-CM | POA: Diagnosis not present

## 2015-11-02 DIAGNOSIS — G894 Chronic pain syndrome: Secondary | ICD-10-CM | POA: Diagnosis not present

## 2015-11-09 ENCOUNTER — Other Ambulatory Visit: Payer: Medicare Other

## 2015-11-10 ENCOUNTER — Telehealth: Payer: Self-pay

## 2015-11-10 DIAGNOSIS — Z9989 Dependence on other enabling machines and devices: Principal | ICD-10-CM

## 2015-11-10 DIAGNOSIS — G4733 Obstructive sleep apnea (adult) (pediatric): Secondary | ICD-10-CM

## 2015-11-10 NOTE — Telephone Encounter (Signed)
Patient came to sleep lab for mask fitting. He is currently on Resmed P10 med. He has a leak and is having trouble wearing it. Fitted him with several different mask. The best choice is the Dreamwear size medium. Her did well with this. Hooked him up to cpap and had him lie on his back to see howit does. There was a 0 leak. He practiced putting it on taking got off. Sent this home with him and my card. Told him to call me if he has any problems.

## 2015-11-13 DIAGNOSIS — Z6824 Body mass index (BMI) 24.0-24.9, adult: Secondary | ICD-10-CM | POA: Diagnosis not present

## 2015-11-13 DIAGNOSIS — M25512 Pain in left shoulder: Secondary | ICD-10-CM | POA: Diagnosis not present

## 2015-12-08 DIAGNOSIS — S0001XA Abrasion of scalp, initial encounter: Secondary | ICD-10-CM | POA: Diagnosis not present

## 2015-12-14 DIAGNOSIS — M25512 Pain in left shoulder: Secondary | ICD-10-CM | POA: Diagnosis not present

## 2015-12-20 DIAGNOSIS — M25512 Pain in left shoulder: Secondary | ICD-10-CM | POA: Diagnosis not present

## 2015-12-21 DIAGNOSIS — M25512 Pain in left shoulder: Secondary | ICD-10-CM | POA: Diagnosis not present

## 2015-12-25 DIAGNOSIS — M25512 Pain in left shoulder: Secondary | ICD-10-CM | POA: Diagnosis not present

## 2016-01-04 DIAGNOSIS — M25512 Pain in left shoulder: Secondary | ICD-10-CM | POA: Diagnosis not present

## 2016-01-05 DIAGNOSIS — H02102 Unspecified ectropion of right lower eyelid: Secondary | ICD-10-CM | POA: Diagnosis not present

## 2016-01-05 DIAGNOSIS — H01001 Unspecified blepharitis right upper eyelid: Secondary | ICD-10-CM | POA: Diagnosis not present

## 2016-01-05 DIAGNOSIS — H02105 Unspecified ectropion of left lower eyelid: Secondary | ICD-10-CM | POA: Diagnosis not present

## 2016-01-05 DIAGNOSIS — H01004 Unspecified blepharitis left upper eyelid: Secondary | ICD-10-CM | POA: Diagnosis not present

## 2016-01-09 DIAGNOSIS — M25512 Pain in left shoulder: Secondary | ICD-10-CM | POA: Diagnosis not present

## 2016-01-10 DIAGNOSIS — M25512 Pain in left shoulder: Secondary | ICD-10-CM | POA: Diagnosis not present

## 2016-01-11 ENCOUNTER — Encounter: Payer: Self-pay | Admitting: Adult Health

## 2016-01-11 ENCOUNTER — Ambulatory Visit (INDEPENDENT_AMBULATORY_CARE_PROVIDER_SITE_OTHER): Payer: Medicare Other | Admitting: Adult Health

## 2016-01-11 VITALS — BP 114/70 | HR 88 | Ht 69.0 in | Wt 150.5 lb

## 2016-01-11 DIAGNOSIS — G4733 Obstructive sleep apnea (adult) (pediatric): Secondary | ICD-10-CM

## 2016-01-11 DIAGNOSIS — Z9989 Dependence on other enabling machines and devices: Principal | ICD-10-CM

## 2016-01-11 NOTE — Patient Instructions (Addendum)
Continue CPAP- USE greater than 4 hours each night Make sure you tighten the straps to the mask each night Memory score was 29/30- good. If your symptoms worsen or you develop new symptoms please let us know.

## 2016-01-11 NOTE — Progress Notes (Signed)
PATIENT: Casey Gates DOB: 1926-07-12  REASON FOR VISIT: follow up- OSA on CPAP HISTORY FROM: patient  HISTORY OF PRESENT ILLNESS: Casey Gates is an 82 year old male with a history of obstructive sleep apnea on CPAP. He returns today for a download. His download indicates that he uses machine 30 out of 30 days for compliance to 100%. He uses machine greater than 4 hours 19 out of 30 days for compliance of 63%. His residual AHI of 16.9 on 7 cm of pressure. The patient does have a significant leak in the 95th percentile at 37.9 L/m. The patient was given a new mask the dreamwear mask and this was fitted by Robin in the sleep lab. Patient states that he feels that the mask is not tight enough and he is unsure how to tighten it. He states that there is also times during the night that his mask comes off when he puts it back on but forgets to turn the machine on. The patient's son thinks that the patient takes his mask off or it comes off and he forgets to put it back on during the night. The patient does like the new mask better. His Epworth sleepiness score is 13. He returns today for an evaluation.  HISTORY 10/10/15: Casey Gates is a 82 year old male with a history of obstructive sleep apnea on CPAP and insomnia. He returns today for a compliance download. His download for the last 30 days indicates that he uses machine 28 out of 30 days for compliance of 93%. On average he uses his machine 4 hours and 13 minutes. He uses machine greater than 4 hours 19 out of 30 days for compliance of 63%. The patient's residual AHI is 25.7 on 7 cm of water with EPR of 3. The patient does have a significant leak in the 95th percentile at 47.0 L/min. Patient states that the mask tends to move around when he sleeps. He is unsure how to tighten the mask. He states that he is unable to sleep on his sides due to back pain. He denies any new medical history. He returns today for an evaluation.   HISTORY 07/10/15 [facility]):  Casey Gates is a widowed , caucasian, right handed 82 y.o. male , seen here as a referral from Dr. Ardeth Perfect for an insomnia evaluation.  Casey Gates states that beginning in the second half of last year 2015 he begun having frequent nights with difficulties to initiate sleep. Sometimes he would not sleep at all most of these insomniac nights allow him for 2-3 hours of nocturnal sleep only. He also states that meanwhile this is a very common finding and affect him 2-3 times per week. He feels excessively fatigued and sleepy in daytime but at night seems to be more awake and stimulated with difficulties to initiating sleep. He is easily falling asleep when not stimulated or not physically active in daytime. To ambulate he uses a walker, uses he has a chronic back condition that begun in his 65s. Basically has lumbar degenerative disc disease and kyphoscoliosis which causes a chronic pain syndrome. He is status post prostate carcinoma with TURP procedure 1997 ,and is going to the bathroom once or twice at night only. He also complained of " restless legs ", but this seems to be a Radiculo-pathic problem, pain arises from the right groin and descends down the ant thigh , associated with back pain. He has trouble finding a comfortbale spot to sit or sleep in.His sleep habits are as  follows: The patient usually goes to bed between 11PM and midnight , on a good evening he will be asleep in 15 minutes.He usually can sleep for 2-2-1/2 hours. He will wake up - not by the bathroom urge or pain or discomfort. what wakes him seems to be a spontaneous arousal. And he feels very wakeful after that and has often trouble to fall back asleep. He is not excessively worried or concerned and is not anxious, " Usually". The bedroom is cool , quiet but not dark - the building is lit from the outside. Mostly has been reading after being awake . He has no obligations to get up in AM, ususally wakes up before the aid is coming to  his room. About 5-6 AM.  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 The patient is blind in his left eye, Esotopia,Has never smoked, has been a Moderate -severe Alcohol drinker but not longer ( over 5 years ).   This is an interval history from Casey Gates, from 07-10-15. Casey Gates split-night polysomnography from 02-14-15 documented an AHI of 51.8 and an RDI of the same number his lowest oxygen desaturation was 77% and a total time in desaturation was 66.9 minutes which is high. He was titrated to 7 cm water pressure and the AHI was 0.0. The setting was only witnessed for about 35 minutes of monitored sleep and half of the time was spent in rem sleep. The sleep efficiency increased to 97.4%. He also seemed to have had suddenly some leg kicking during the CPAP titration that was not visible before this may be a sign of poor tolerance to CPAP however it stopped before the sleep study was and finally titrated. I have today the opportunity to look at a download of data the therapeutic data show an 87% compliance for 26 out of 30 days of use but the average user time is only 3 hours and 16 minutes. So he has improved in comparison to his last download. The set pressure is 7 cm water with 3 cm EPR but the AHI is suddenly shot up to 24 apneas that's almost 50% of what he was diagnosed with. There is a very high air leak but the patient just changed to a new mask. I wonder if he does not achieve a good seal with this mask either. He was confused about the letters R and L on the nasal pillow- I explained that these mean the nostrils right and left. We used a mask form our sleep lab and had the patient try it on and plug it into the frame.   REVIEW OF SYSTEMS: Out of a complete 14 system review of symptoms, the patient complains only of the following symptoms, and all other reviewed systems are negative.  Restless leg, insomnia,  frequent waking, back pain  ALLERGIES: Allergies  Allergen Reactions  . Peanuts [Peanut Oil] Other (See Comments)    Shortness of breath---all nuts, but can have peanuts, cashews, and almonds  . Coconut Oil     Unknown childhood reaction.      HOME MEDICATIONS: Outpatient Prescriptions Prior to Visit  Medication Sig Dispense Refill  . clobetasol cream (TEMOVATE) [id number] % Apply 1 application topically 2 (two) times daily.  0  . gabapentin (NEURONTIN) 300 MG capsule Take 300 mg by mouth 2 (two) times daily.    Marland Kitchen oxymorphone (OPANA ER) 40 MG 12 hr  tablet Take 40 mg by mouth every 12 (twelve) hours.    . docusate sodium (COLACE) 100 MG capsule Take 100 mg by mouth daily as needed for mild constipation or moderate constipation.     . fluticasone (FLONASE) 50 MCG/ACT nasal spray instill 2 sprays into each nostril once daily  0  . OPANA ER, CRUSH RESISTANT, 10 MG T12A 12 hr tablet Reported on 01/11/2016  0  . oxyCODONE-acetaminophen (PERCOCET/ROXICET) 5-325 MG per tablet Take 1 tablet by mouth 2 (two) times daily as needed (BREAKTHROUGH PAIN). Reported on 01/11/2016  0  . polyethylene glycol (MIRALAX / GLYCOLAX) packet Take 17 g by mouth daily as needed for moderate constipation. For constipation    . pregabalin (LYRICA) 75 MG capsule Take 75 mg by mouth 2 (two) times daily.     No facility-administered medications prior to visit.    PAST MEDICAL HISTORY: Past Medical History  Diagnosis Date  . Blind left eye   . Restless leg   . Asthma     AS A CHILD  . Chronic kidney disease   . Cancer [facility])     PROSTATE CANCER  . DDD (degenerative disc disease), lumbar   . Chronic pain syndrome   . Insomnia     PAST SURGICAL HISTORY: Past Surgical History  Procedure Laterality Date  . Prostatectomy    . Rigth nephrectomy  2006  . Right cataract extraction  2013  . Tonsillectomy    . Transurethral resection of prostate  1997  . Cholecystectomy      FAMILY HISTORY: Family History  Problem  Relation Age of Onset  . Bone cancer Mother   . Hypertension Mother   . Arthritis Mother     SOCIAL HISTORY: Social History   Social History  . Marital Status: Widowed    Spouse Name: N/A  . Number of Children: N/A  . Years of Education: N/A   Occupational History  . Not on file.   Social History Main Topics  . Smoking status: Never Smoker   . Smokeless tobacco: Never Used  . Alcohol Use: No     Comment: QUIT ALCOHOL AT AGE 20  . Drug Use: No  . Sexual Activity: No   Other Topics Concern  . Not on file   Social History Narrative   Caffeine 2-3 cups average.  Widowed,  Lives at [facility], [place]. Living.  Retired Forensic psychologist.  One son.      PHYSICAL EXAM  Filed Vitals:   01/11/16 0905  BP: 114/70  Pulse: 88  Height: 5\' 9"  (1.753 m)  Weight: 150 lb 8 oz (68.266 kg)   Body mass index is 22.21 kg/(m^2).  Generalized: Well developed, in no acute distress  Neck: Circumference 15 inches, Mallampati 3+  Neurological examination  Mentation: Alert oriented to time, place, history taking. Follows all commands speech and language fluent Cranial nerve II-XII: Pupils were equal round reactive to light. Extraocular movements were full, visual field were full on confrontational test. Facial sensation and strength were normal. Uvula tongue midline. Head turning and shoulder shrug  were normal and symmetric. Motor: The motor testing reveals 5 over 5 strength of all 4 extremities. Good symmetric motor tone is noted throughout.  Sensory: Sensory testing is intact to soft touch on all 4 extremities. No evidence of extinction is noted.  Coordination: Cerebellar testing reveals good finger-nose-finger and heel-to-shin bilaterally.  Gait and station: Patient uses a rolling walker when ambulating. Tandem gait not attempted. Reflexes: Deep tendon reflexes  are symmetric and normal bilaterally.   DIAGNOSTIC DATA (LABS, IMAGING, TESTING) - I reviewed patient records, labs, notes,  testing and imaging myself where available.  Lab Results  Component Value Date   WBC 8.3 07/29/2015   HGB 14.3 07/29/2015   HCT 44.3 07/29/2015   MCV 89.0 07/29/2015   PLT 154 07/29/2015      Component Value Date/Time   NA 140 07/29/2015 1540   K 4.3 07/29/2015 1540   CL 108 07/29/2015 1540   CO2 26 07/29/2015 1540   GLUCOSE 109* 07/29/2015 1540   BUN 23* 07/29/2015 1540   CREATININE 0.84 07/29/2015 1540   CALCIUM 9.1 07/29/2015 1540   PROT 6.4 05/07/2013 1814   ALBUMIN 3.5 05/07/2013 1814   AST 21 05/07/2013 1814   ALT 15 05/07/2013 1814   ALKPHOS 58 05/07/2013 1814   BILITOT 0.4 05/07/2013 1814   GFRNONAA >60 07/29/2015 1540   GFRAA >60 07/29/2015 1540       ASSESSMENT AND PLAN 82 y.o. year old male  has a past medical history of Blind left eye; Restless leg; Asthma; Chronic kidney disease; Cancer ([place]); DDD (degenerative disc disease), lumbar; Chronic pain syndrome; and Insomnia. here with:  1. Obstructive sleep apnea on CPAP  The patient's apnea index is still elevated and he continues to have a leak with the mask. I educated the patient on how to tighten his mask. He then demonstrated this to me. We will see if this helps improve his leak and an AHI. Patient will follow-up in 3-4 months with Dr. Mechele Claude, MSN, NP-C 01/11/2016, 9:14 AM [facility] [address], [place], [place] [address] [phone]

## 2016-01-11 NOTE — Progress Notes (Signed)
I agree with the assessment and plan as directed by NP .The patient is known to me .   Yan Pankratz, MD  

## 2016-01-19 DIAGNOSIS — E559 Vitamin D deficiency, unspecified: Secondary | ICD-10-CM | POA: Diagnosis not present

## 2016-01-19 DIAGNOSIS — M810 Age-related osteoporosis without current pathological fracture: Secondary | ICD-10-CM | POA: Diagnosis not present

## 2016-01-19 DIAGNOSIS — Z0000 Encounter for general adult medical examination without abnormal findings: Secondary | ICD-10-CM | POA: Diagnosis not present

## 2016-01-19 DIAGNOSIS — Z125 Encounter for screening for malignant neoplasm of prostate: Secondary | ICD-10-CM | POA: Diagnosis not present

## 2016-01-26 DIAGNOSIS — Z79891 Long term (current) use of opiate analgesic: Secondary | ICD-10-CM | POA: Diagnosis not present

## 2016-01-26 DIAGNOSIS — M5136 Other intervertebral disc degeneration, lumbar region: Secondary | ICD-10-CM | POA: Diagnosis not present

## 2016-01-26 DIAGNOSIS — M4806 Spinal stenosis, lumbar region: Secondary | ICD-10-CM | POA: Diagnosis not present

## 2016-01-26 DIAGNOSIS — G894 Chronic pain syndrome: Secondary | ICD-10-CM | POA: Diagnosis not present

## 2016-01-29 DIAGNOSIS — Z0000 Encounter for general adult medical examination without abnormal findings: Secondary | ICD-10-CM | POA: Diagnosis not present

## 2016-01-29 DIAGNOSIS — Z6823 Body mass index (BMI) 23.0-23.9, adult: Secondary | ICD-10-CM | POA: Diagnosis not present

## 2016-01-29 DIAGNOSIS — F330 Major depressive disorder, recurrent, mild: Secondary | ICD-10-CM | POA: Diagnosis not present

## 2016-01-29 DIAGNOSIS — G894 Chronic pain syndrome: Secondary | ICD-10-CM | POA: Diagnosis not present

## 2016-01-29 DIAGNOSIS — E559 Vitamin D deficiency, unspecified: Secondary | ICD-10-CM | POA: Diagnosis not present

## 2016-01-29 DIAGNOSIS — Z8546 Personal history of malignant neoplasm of prostate: Secondary | ICD-10-CM | POA: Diagnosis not present

## 2016-01-29 DIAGNOSIS — K5909 Other constipation: Secondary | ICD-10-CM | POA: Diagnosis not present

## 2016-01-29 DIAGNOSIS — Z1389 Encounter for screening for other disorder: Secondary | ICD-10-CM | POA: Diagnosis not present

## 2016-01-29 DIAGNOSIS — M810 Age-related osteoporosis without current pathological fracture: Secondary | ICD-10-CM | POA: Diagnosis not present

## 2016-01-29 DIAGNOSIS — G4733 Obstructive sleep apnea (adult) (pediatric): Secondary | ICD-10-CM | POA: Diagnosis not present

## 2016-02-06 DIAGNOSIS — L821 Other seborrheic keratosis: Secondary | ICD-10-CM | POA: Diagnosis not present

## 2016-02-06 DIAGNOSIS — S80812A Abrasion, left lower leg, initial encounter: Secondary | ICD-10-CM | POA: Diagnosis not present

## 2016-02-06 DIAGNOSIS — L853 Xerosis cutis: Secondary | ICD-10-CM | POA: Diagnosis not present

## 2016-02-06 DIAGNOSIS — D692 Other nonthrombocytopenic purpura: Secondary | ICD-10-CM | POA: Diagnosis not present

## 2016-02-06 DIAGNOSIS — D690 Allergic purpura: Secondary | ICD-10-CM | POA: Diagnosis not present

## 2016-02-14 DIAGNOSIS — E559 Vitamin D deficiency, unspecified: Secondary | ICD-10-CM | POA: Diagnosis not present

## 2016-02-14 DIAGNOSIS — M810 Age-related osteoporosis without current pathological fracture: Secondary | ICD-10-CM | POA: Diagnosis not present

## 2016-02-14 DIAGNOSIS — Z6823 Body mass index (BMI) 23.0-23.9, adult: Secondary | ICD-10-CM | POA: Diagnosis not present

## 2016-02-19 DIAGNOSIS — H02102 Unspecified ectropion of right lower eyelid: Secondary | ICD-10-CM | POA: Diagnosis not present

## 2016-02-19 DIAGNOSIS — H2522 Age-related cataract, morgagnian type, left eye: Secondary | ICD-10-CM | POA: Diagnosis not present

## 2016-02-19 DIAGNOSIS — H01001 Unspecified blepharitis right upper eyelid: Secondary | ICD-10-CM | POA: Diagnosis not present

## 2016-02-28 ENCOUNTER — Other Ambulatory Visit (HOSPITAL_COMMUNITY): Payer: Self-pay | Admitting: *Deleted

## 2016-02-29 ENCOUNTER — Inpatient Hospital Stay (HOSPITAL_COMMUNITY)
Admission: RE | Admit: 2016-02-29 | Discharge: 2016-02-29 | Disposition: A | Discharge status: Home / Self Care | Payer: Medicare Other | Source: Ambulatory Visit | Attending: Internal Medicine | Admitting: Internal Medicine

## 2016-02-29 NOTE — Progress Notes (Signed)
Pt did not show up for Prolia injection scheduled for 10:30am, 02/29/16.

## 2016-03-27 DIAGNOSIS — L821 Other seborrheic keratosis: Secondary | ICD-10-CM | POA: Diagnosis not present

## 2016-03-27 DIAGNOSIS — D692 Other nonthrombocytopenic purpura: Secondary | ICD-10-CM | POA: Diagnosis not present

## 2016-03-27 DIAGNOSIS — L812 Freckles: Secondary | ICD-10-CM | POA: Diagnosis not present

## 2016-03-27 DIAGNOSIS — D690 Allergic purpura: Secondary | ICD-10-CM | POA: Diagnosis not present

## 2016-04-03 DIAGNOSIS — K5909 Other constipation: Secondary | ICD-10-CM | POA: Diagnosis not present

## 2016-04-03 DIAGNOSIS — F330 Major depressive disorder, recurrent, mild: Secondary | ICD-10-CM | POA: Diagnosis not present

## 2016-04-03 DIAGNOSIS — Z6823 Body mass index (BMI) 23.0-23.9, adult: Secondary | ICD-10-CM | POA: Diagnosis not present

## 2016-04-03 DIAGNOSIS — R5383 Other fatigue: Secondary | ICD-10-CM | POA: Diagnosis not present

## 2016-04-03 DIAGNOSIS — G4700 Insomnia, unspecified: Secondary | ICD-10-CM | POA: Diagnosis not present

## 2016-04-11 ENCOUNTER — Other Ambulatory Visit (HOSPITAL_COMMUNITY): Payer: Self-pay | Admitting: *Deleted

## 2016-04-12 ENCOUNTER — Ambulatory Visit (HOSPITAL_COMMUNITY)
Admission: RE | Admit: 2016-04-12 | Discharge: 2016-04-12 | Disposition: A | Discharge status: Home / Self Care | Payer: Medicare Other | Source: Ambulatory Visit | Attending: Internal Medicine | Admitting: Internal Medicine

## 2016-04-12 DIAGNOSIS — M810 Age-related osteoporosis without current pathological fracture: Secondary | ICD-10-CM | POA: Insufficient documentation

## 2016-04-12 MED ORDER — DENOSUMAB 60 MG/ML ~~LOC~~ SOLN
60.0000 mg | Freq: Once | SUBCUTANEOUS | Status: AC
  Administered 2016-04-12: 60 mg via SUBCUTANEOUS
  Filled 2016-04-12: qty 1

## 2016-04-14 ENCOUNTER — Emergency Department (HOSPITAL_COMMUNITY): Payer: Medicare Other

## 2016-04-14 ENCOUNTER — Emergency Department (HOSPITAL_COMMUNITY)
Admission: EM | Admit: 2016-04-14 | Discharge: 2016-04-14 | Disposition: A | Discharge status: Home / Self Care | Payer: Medicare Other | Attending: Emergency Medicine | Admitting: Emergency Medicine

## 2016-04-14 ENCOUNTER — Encounter (HOSPITAL_COMMUNITY): Payer: Self-pay | Admitting: Emergency Medicine

## 2016-04-14 DIAGNOSIS — G4700 Insomnia, unspecified: Secondary | ICD-10-CM | POA: Insufficient documentation

## 2016-04-14 DIAGNOSIS — Z79899 Other long term (current) drug therapy: Secondary | ICD-10-CM | POA: Diagnosis not present

## 2016-04-14 DIAGNOSIS — N189 Chronic kidney disease, unspecified: Secondary | ICD-10-CM | POA: Diagnosis not present

## 2016-04-14 DIAGNOSIS — Z8719 Personal history of other diseases of the digestive system: Secondary | ICD-10-CM | POA: Diagnosis not present

## 2016-04-14 DIAGNOSIS — G2581 Restless legs syndrome: Secondary | ICD-10-CM | POA: Diagnosis not present

## 2016-04-14 DIAGNOSIS — J45909 Unspecified asthma, uncomplicated: Secondary | ICD-10-CM | POA: Insufficient documentation

## 2016-04-14 DIAGNOSIS — Z7951 Long term (current) use of inhaled steroids: Secondary | ICD-10-CM | POA: Diagnosis not present

## 2016-04-14 DIAGNOSIS — G894 Chronic pain syndrome: Secondary | ICD-10-CM | POA: Diagnosis not present

## 2016-04-14 DIAGNOSIS — H5442 Blindness, left eye, normal vision right eye: Secondary | ICD-10-CM | POA: Diagnosis not present

## 2016-04-14 DIAGNOSIS — M549 Dorsalgia, unspecified: Secondary | ICD-10-CM | POA: Diagnosis not present

## 2016-04-14 DIAGNOSIS — R079 Chest pain, unspecified: Secondary | ICD-10-CM | POA: Diagnosis not present

## 2016-04-14 DIAGNOSIS — Z8546 Personal history of malignant neoplasm of prostate: Secondary | ICD-10-CM | POA: Insufficient documentation

## 2016-04-14 HISTORY — DX: Constipation, unspecified: K59.00

## 2016-04-14 LAB — CBC
HCT: 41.6 % (ref 39.0–52.0)
Hemoglobin: 13.2 g/dL (ref 13.0–17.0)
MCH: 28.5 pg (ref 26.0–34.0)
MCHC: 31.7 g/dL (ref 30.0–36.0)
MCV: 89.8 fL (ref 78.0–100.0)
PLATELETS: 141 10*3/uL — AB (ref 150–400)
RBC: 4.63 MIL/uL (ref 4.22–5.81)
RDW: 13.4 % (ref 11.5–15.5)
WBC: 6.7 10*3/uL (ref 4.0–10.5)

## 2016-04-14 LAB — BASIC METABOLIC PANEL
ANION GAP: 7 (ref 5–15)
BUN: 17 mg/dL (ref 6–20)
CALCIUM: 8.3 mg/dL — AB (ref 8.9–10.3)
CO2: 27 mmol/L (ref 22–32)
Chloride: 105 mmol/L (ref 101–111)
Creatinine, Ser: 1.14 mg/dL (ref 0.61–1.24)
GFR calc Af Amer: >60 mL/min (ref >60)
GFR, EST NON AFRICAN AMERICAN: 56 mL/min — AB (ref >60)
GLUCOSE: 98 mg/dL (ref 65–99)
POTASSIUM: 4.3 mmol/L (ref 3.5–5.1)
SODIUM: 139 mmol/L (ref 135–145)

## 2016-04-14 LAB — I-STAT TROPONIN, ED
TROPONIN I, POC: 0 ng/mL (ref 0.00–0.08)
TROPONIN I, POC: 0 ng/mL (ref 0.00–0.08)

## 2016-04-14 NOTE — Discharge Instructions (Signed)
See heart doctor this week.  If you were given medicines take as directed.  If you are on coumadin or contraceptives realize their levels and effectiveness is altered by many different medicines.  If you have any reaction (rash, tongues swelling, other) to the medicines stop taking and see a physician.    If your blood pressure was elevated in the ER make sure you follow up for management with a primary doctor or return for chest pain, shortness of breath or stroke symptoms.  Please follow up as directed and return to the ER or see a physician for new or worsening symptoms.  Thank you. Filed Vitals:   04/14/16 1557  BP: 127/68  Pulse: 68  Temp: 98.4 F (36.9 C)  TempSrc: Oral  Resp: 18  SpO2: 97%

## 2016-04-14 NOTE — ED Notes (Signed)
PT to ED via [facility] with c/o right chest pain onset earlier today.  Pt denies any nausea, vomiting, shortness of breath or diaphoresis.  St's he just wanted to be checked out.  EMS gave pt NTG SL x's 1 without relief.  Also gave pt ASA 324mg .

## 2016-04-14 NOTE — ED Provider Notes (Signed)
CSN: [id number]     Arrival date & time 04/14/16  1515 History   First MD Initiated Contact with Patient 04/14/16 1517     Chief Complaint  Patient presents with  . Chest Pain     (Consider location/radiation/quality/duration/timing/severity/associated sxs/prior Treatment) HPI Comments: 82 year old male with history of chronic back pain on narcotics, prostate cancer no current treatment, sleep apnea presents with intermittent left lower chest pain. Brief lasting up to 1 minute at a time. Mild ache sensation. Started around lunchtime. Multiple episodes since. No history of similar. No cardiac history. No known vascular disease no diabetes, no high blood pressure, no recent surgery, no blood clot history, no leg swelling. No exertional symptoms or diaphoresis. Nonradiating.  Patient is a 82 y.o. male presenting with chest pain. The history is provided by the patient.  Chest Pain Associated symptoms: back pain (chronic)   Associated symptoms: no abdominal pain, no fever, no headache, no shortness of breath and not vomiting     Past Medical History  Diagnosis Date  . Blind left eye   . Restless leg   . Asthma     AS A CHILD  . Chronic kidney disease   . Cancer [facility])     PROSTATE CANCER  . DDD (degenerative disc disease), lumbar   . Chronic pain syndrome   . Insomnia   . Constipation    Past Surgical History  Procedure Laterality Date  . Prostatectomy    . Rigth nephrectomy  2006  . Right cataract extraction  2013  . Tonsillectomy    . Transurethral resection of prostate  1997  . Cholecystectomy     Family History  Problem Relation Age of Onset  . Bone cancer Mother   . Hypertension Mother   . Arthritis Mother    Social History  Substance Use Topics  . Smoking status: Never Smoker   . Smokeless tobacco: Never Used  . Alcohol Use: No     Comment: QUIT ALCOHOL AT AGE 58    Review of Systems  Constitutional: Negative for fever and chills.  HENT: Negative for  congestion.   Eyes: Negative for visual disturbance.  Respiratory: Negative for shortness of breath.   Cardiovascular: Positive for chest pain. Negative for leg swelling.  Gastrointestinal: Negative for vomiting and abdominal pain.  Genitourinary: Negative for dysuria and flank pain.  Musculoskeletal: Positive for back pain (chronic). Negative for neck pain and neck stiffness.  Skin: Negative for rash.  Neurological: Negative for light-headedness and headaches.      Allergies  Peanuts and Coconut oil  Home Medications   Prior to Admission medications   Medication Sig Start Date End Date Taking? Authorizing Provider  clobetasol cream (TEMOVATE) [id number] % Apply 1 application topically daily as needed (for itching).  07/14/15  Yes Historical Provider, MD  fluticasone (FLONASE) 50 MCG/ACT nasal spray Place 2 sprays into both nostrils daily.  04/03/16  Yes Historical Provider, MD  gabapentin (NEURONTIN) 300 MG capsule Take 300 mg by mouth 2 (two) times daily.   Yes Historical Provider, MD  OPANA ER, CRUSH RESISTANT, 40 MG T12A Take 40 mg by mouth every 12 (twelve) hours. Reported on 04/14/2016 04/04/16  Yes Historical Provider, MD  oxymorphone (OPANA) 10 MG tablet 10 mg 2 (two) times daily as needed for pain.  01/30/16  Yes Historical Provider, MD  Polyethyl Glyc-Propyl Glyc PF (LUBRICANT EYE DROPS, PF,) 0.4-0.3 % SOLN Place 1 drop into both eyes daily as needed.   Yes Historical Provider, MD  BP 127/68 mmHg  Pulse 68  Temp(Src) 98.4 F (36.9 C) (Oral)  Resp 18  SpO2 97% Physical Exam  Constitutional: He is oriented to person, place, and time. He appears well-developed and well-nourished.  HENT:  Head: Normocephalic and atraumatic.  Eyes: Conjunctivae are normal. Right eye exhibits no discharge. Left eye exhibits no discharge.  Neck: Normal range of motion. Neck supple. No tracheal deviation present.  Cardiovascular: Normal rate, regular rhythm and intact distal pulses.   Pulmonary/Chest:  Effort normal and breath sounds normal.  Abdominal: Soft. He exhibits no distension. There is no tenderness. There is no guarding.  Musculoskeletal: He exhibits no edema.  Neurological: He is alert and oriented to person, place, and time.  Skin: Skin is warm. No rash noted.  Psychiatric: He has a normal mood and affect.  Nursing note and vitals reviewed.   ED Course  Procedures (including critical care time) Labs Review Labs Reviewed  CBC - Abnormal; Notable for the following:    Platelets 141 (*)    All other components within normal limits  BASIC METABOLIC PANEL - Abnormal; Notable for the following:    Calcium 8.3 (*)    GFR calc non Af Amer 56 (*)    All other components within normal limits  URINALYSIS, ROUTINE W REFLEX MICROSCOPIC (NOT AT [facility])  I-STAT TROPOININ, ED  I-STAT TROPOININ, ED  Randolm Idol, ED    Imaging Review Dg Chest 2 View  04/14/2016  CLINICAL DATA:  Left sided chest pain that started this afternoon. HX asthma, prostate cancer EXAM: CHEST  2 VIEW COMPARISON:  07/29/2015 FINDINGS: Elevated right hemidiaphragm is stable. Mild cardiomegaly. There is minimal basilar atelectasis. No focal consolidations. No pulmonary edema. IMPRESSION: No evidence for acute cardiopulmonary abnormality. Electronically Signed   By: Nolon Nations M.D.   On: 04/14/2016 16:13   I have personally reviewed and evaluated these images and lab results as part of my medical decision-making.   EKG Interpretation   Date/Time:  Sunday Apr 14 2016 15:50:26 EDT Ventricular Rate:  65 PR Interval:  176 QRS Duration: 115 QT Interval:  423 QTC Calculation: 440 R Axis:   -67 Text Interpretation:  Sinus rhythm Atrial premature complex Incomplete  RBBB and LAFB Similar to previous Confirmed by Garcia Dalzell MD, Orphia Mctigue [phone])  on 04/14/2016 4:16:43 PM      MDM   Final diagnoses:  Chest pain, unspecified   Patient presents with intermittent brief chest pain left lower lasting a few  seconds at a time.  No exertional or diaphoretic component. EKG similar to previous. Patient is very healthy for his age no history of vascular disease. Plan for cardiac screen. Patient denies classic blood clot risk factors. Minimal symptoms currently. Patient had aspirin prior to arrival.  On recheck patient well-appearing no significant discomfort. Patient keeps having intermittent symptoms for seconds. Partial relation to movement. Patient did recall position relationship to pain. Discussed observation versus close outpatient follow-up with his cardiologist. Patient and son are comfortable with following in the cardiology clinic this week.  Results and differential diagnosis were discussed with the patient/parent/guardian. Xrays were independently reviewed by myself.  Close follow up outpatient was discussed, comfortable with the plan.   Medications - No data to display  Filed Vitals:   04/14/16 1557  BP: 127/68  Pulse: 68  Temp: 98.4 F (36.9 C)  TempSrc: Oral  Resp: 18  SpO2: 97%    Final diagnoses:  Chest pain, unspecified        Vonna Kotyk  Reather Converse, MD 04/14/16 [id number]

## 2016-04-14 NOTE — ED Notes (Signed)
EMS reported pt c/o right chest pain.  On assessment pt c/o left lower chest pain.  Pt st's pain comes and goes and only last for a few seconds.  Pt continues to deny shortness of breath, nausea, vomiting or diaphoresis.  Pt's son at bedside.

## 2016-04-15 ENCOUNTER — Ambulatory Visit: Payer: Medicare Other | Admitting: Neurology

## 2016-04-24 ENCOUNTER — Ambulatory Visit: Payer: Medicare Other | Admitting: Nurse Practitioner

## 2016-05-10 DIAGNOSIS — L259 Unspecified contact dermatitis, unspecified cause: Secondary | ICD-10-CM | POA: Diagnosis not present

## 2016-05-10 DIAGNOSIS — L820 Inflamed seborrheic keratosis: Secondary | ICD-10-CM | POA: Diagnosis not present

## 2016-05-13 DIAGNOSIS — L309 Dermatitis, unspecified: Secondary | ICD-10-CM | POA: Diagnosis not present

## 2016-05-22 DIAGNOSIS — L309 Dermatitis, unspecified: Secondary | ICD-10-CM | POA: Diagnosis not present

## 2016-05-30 DIAGNOSIS — L309 Dermatitis, unspecified: Secondary | ICD-10-CM | POA: Diagnosis not present

## 2016-06-03 DIAGNOSIS — M5136 Other intervertebral disc degeneration, lumbar region: Secondary | ICD-10-CM | POA: Diagnosis not present

## 2016-06-03 DIAGNOSIS — G894 Chronic pain syndrome: Secondary | ICD-10-CM | POA: Diagnosis not present

## 2016-06-03 DIAGNOSIS — Z79891 Long term (current) use of opiate analgesic: Secondary | ICD-10-CM | POA: Diagnosis not present

## 2016-06-03 DIAGNOSIS — M4806 Spinal stenosis, lumbar region: Secondary | ICD-10-CM | POA: Diagnosis not present

## 2016-06-11 DIAGNOSIS — G2581 Restless legs syndrome: Secondary | ICD-10-CM | POA: Diagnosis not present

## 2016-06-11 DIAGNOSIS — R5383 Other fatigue: Secondary | ICD-10-CM | POA: Diagnosis not present

## 2016-06-11 DIAGNOSIS — Z6822 Body mass index (BMI) 22.0-22.9, adult: Secondary | ICD-10-CM | POA: Diagnosis not present

## 2016-06-20 DIAGNOSIS — L089 Local infection of the skin and subcutaneous tissue, unspecified: Secondary | ICD-10-CM | POA: Diagnosis not present

## 2016-06-20 DIAGNOSIS — S80922D Unspecified superficial injury of left lower leg, subsequent encounter: Secondary | ICD-10-CM | POA: Diagnosis not present

## 2016-06-20 DIAGNOSIS — I8310 Varicose veins of unspecified lower extremity with inflammation: Secondary | ICD-10-CM | POA: Diagnosis not present

## 2016-06-20 DIAGNOSIS — R5383 Other fatigue: Secondary | ICD-10-CM | POA: Diagnosis not present

## 2016-06-20 DIAGNOSIS — Z6822 Body mass index (BMI) 22.0-22.9, adult: Secondary | ICD-10-CM | POA: Diagnosis not present

## 2016-06-24 DIAGNOSIS — L089 Local infection of the skin and subcutaneous tissue, unspecified: Secondary | ICD-10-CM | POA: Diagnosis not present

## 2016-06-24 DIAGNOSIS — Z6822 Body mass index (BMI) 22.0-22.9, adult: Secondary | ICD-10-CM | POA: Diagnosis not present

## 2016-06-24 DIAGNOSIS — I8310 Varicose veins of unspecified lower extremity with inflammation: Secondary | ICD-10-CM | POA: Diagnosis not present

## 2016-06-24 DIAGNOSIS — S80922S Unspecified superficial injury of left lower leg, sequela: Secondary | ICD-10-CM | POA: Diagnosis not present

## 2016-06-26 DIAGNOSIS — Z6822 Body mass index (BMI) 22.0-22.9, adult: Secondary | ICD-10-CM | POA: Diagnosis not present

## 2016-06-26 DIAGNOSIS — I8310 Varicose veins of unspecified lower extremity with inflammation: Secondary | ICD-10-CM | POA: Diagnosis not present

## 2016-07-10 DIAGNOSIS — L309 Dermatitis, unspecified: Secondary | ICD-10-CM | POA: Diagnosis not present

## 2016-07-10 DIAGNOSIS — L080 Pyoderma: Secondary | ICD-10-CM | POA: Diagnosis not present

## 2016-07-20 ENCOUNTER — Encounter (HOSPITAL_COMMUNITY): Payer: Self-pay | Admitting: Emergency Medicine

## 2016-07-20 ENCOUNTER — Emergency Department (HOSPITAL_COMMUNITY)
Admission: EM | Admit: 2016-07-20 | Discharge: 2016-07-20 | Disposition: A | Discharge status: Home / Self Care | Payer: Medicare Other | Attending: Emergency Medicine | Admitting: Emergency Medicine

## 2016-07-20 DIAGNOSIS — J45909 Unspecified asthma, uncomplicated: Secondary | ICD-10-CM | POA: Insufficient documentation

## 2016-07-20 DIAGNOSIS — T40691A Poisoning by other narcotics, accidental (unintentional), initial encounter: Secondary | ICD-10-CM | POA: Diagnosis not present

## 2016-07-20 DIAGNOSIS — T402X1A Poisoning by other opioids, accidental (unintentional), initial encounter: Secondary | ICD-10-CM | POA: Diagnosis not present

## 2016-07-20 DIAGNOSIS — T50901A Poisoning by unspecified drugs, medicaments and biological substances, accidental (unintentional), initial encounter: Secondary | ICD-10-CM

## 2016-07-20 DIAGNOSIS — N189 Chronic kidney disease, unspecified: Secondary | ICD-10-CM | POA: Insufficient documentation

## 2016-07-20 DIAGNOSIS — Z79899 Other long term (current) drug therapy: Secondary | ICD-10-CM | POA: Diagnosis not present

## 2016-07-20 DIAGNOSIS — Z036 Encounter for observation for suspected toxic effect from ingested substance ruled out: Secondary | ICD-10-CM | POA: Diagnosis present

## 2016-07-20 DIAGNOSIS — T50904A Poisoning by unspecified drugs, medicaments and biological substances, undetermined, initial encounter: Secondary | ICD-10-CM | POA: Diagnosis not present

## 2016-07-20 DIAGNOSIS — Z91010 Allergy to peanuts: Secondary | ICD-10-CM | POA: Diagnosis not present

## 2016-07-20 DIAGNOSIS — Z8546 Personal history of malignant neoplasm of prostate: Secondary | ICD-10-CM | POA: Insufficient documentation

## 2016-07-20 LAB — CBC WITH DIFFERENTIAL/PLATELET
BASOS ABS: 0 10*3/uL (ref 0.0–0.1)
BASOS PCT: 0 %
EOS PCT: 5 %
Eosinophils Absolute: 0.3 10*3/uL (ref 0.0–0.7)
HCT: 41.1 % (ref 39.0–52.0)
Hemoglobin: 13.3 g/dL (ref 13.0–17.0)
LYMPHS PCT: 28 %
Lymphs Abs: 1.8 10*3/uL (ref 0.7–4.0)
MCH: 29 pg (ref 26.0–34.0)
MCHC: 32.4 g/dL (ref 30.0–36.0)
MCV: 89.7 fL (ref 78.0–100.0)
MONO ABS: 0.8 10*3/uL (ref 0.1–1.0)
Monocytes Relative: 13 %
Neutro Abs: 3.6 10*3/uL (ref 1.7–7.7)
Neutrophils Relative %: 54 %
PLATELETS: 157 10*3/uL (ref 150–400)
RBC: 4.58 MIL/uL (ref 4.22–5.81)
RDW: 13.5 % (ref 11.5–15.5)
WBC: 6.6 10*3/uL (ref 4.0–10.5)

## 2016-07-20 LAB — COMPREHENSIVE METABOLIC PANEL
ALT: 21 U/L (ref 17–63)
ANION GAP: 6 (ref 5–15)
AST: 25 U/L (ref 15–41)
Albumin: 3.6 g/dL (ref 3.5–5.0)
Alkaline Phosphatase: 58 U/L (ref 38–126)
BUN: 19 mg/dL (ref 6–20)
CHLORIDE: 105 mmol/L (ref 101–111)
CO2: 27 mmol/L (ref 22–32)
Calcium: 8.7 mg/dL — ABNORMAL LOW (ref 8.9–10.3)
Creatinine, Ser: 0.73 mg/dL (ref 0.61–1.24)
GFR calc Af Amer: >60 mL/min (ref >60)
Glucose, Bld: 89 mg/dL (ref 65–99)
POTASSIUM: 4.1 mmol/L (ref 3.5–5.1)
Sodium: 138 mmol/L (ref 135–145)
TOTAL PROTEIN: 5.9 g/dL — AB (ref 6.5–8.1)
Total Bilirubin: 0.8 mg/dL (ref 0.3–1.2)

## 2016-07-20 LAB — ETHANOL

## 2016-07-20 LAB — ACETAMINOPHEN LEVEL

## 2016-07-20 LAB — SALICYLATE LEVEL

## 2016-07-20 NOTE — ED Notes (Signed)
Bed: [id number] Expected date:  Expected time:  Means of arrival:  Comments: OD?

## 2016-07-20 NOTE — ED Notes (Signed)
Pt drank orange juice without difficulty and ate a [place] sandwich.

## 2016-07-20 NOTE — ED Notes (Signed)
Pt given turkey sandwich and orange juice

## 2016-07-20 NOTE — ED Notes (Signed)
Pt ambulated to bathroom with walker without difficulty. Pt usually uses walker at home.

## 2016-07-20 NOTE — ED Provider Notes (Signed)
[place] DEPT Provider Note   CSN: [id number] Arrival date & time: 07/20/16  0710     History   Chief Complaint Chief Complaint  Patient presents with  . Drug Overdose    HPI Casey Gates is a 82 y.o. male.  Patient presents from assisted living facility he thinks he took a double dose of his Opana extended-release 40 mg. He states he wrote down that he took at around 4:30 this morning and then took it again around 6 AM. He does not remember taking the 4:30 dose. He denies any dizziness, lightheadedness, chest pain or shortness of breath. His chronic back pain is at baseline. He denies any thoughts of self-harm and states this was accidental. He also takes Neurontin which he did not take this morning. Denies any chest pain or shortness of breath. Denies any focal weakness, numbness or tingling. Denies any fever, nausea or vomiting. Denies any suicidal or homicidal thoughts.   The history is provided by the patient.  Drug Overdose  Pertinent negatives include no chest pain, no abdominal pain, no headaches and no shortness of breath.    Past Medical History:  Diagnosis Date  . Asthma    AS A CHILD  . Blind left eye   . [facility]    PROSTATE CANCER  . Chronic kidney disease   . Chronic pain syndrome   . Constipation   . DDD (degenerative disc disease), lumbar   . Insomnia   . Restless leg     Patient Active Problem List   Diagnosis Date Noted  . Chest pain 07/29/2015  . OSA on CPAP 07/10/2015  . CPAP use counseling 07/10/2015  . Excessive daytime sleepiness 02/22/2015  . Sleep apnea 02/22/2015  . Snoring 02/22/2015  . Nocturia more than twice per night 02/22/2015  . Syncope 12/31/2012  . Bifascicular bundle branch block 12/31/2012    Class: Acute  . Cellulitis and abscess of leg, except foot 12/04/2012  . Chronic pain 12/04/2012  . Kyphosis 12/04/2012  . H/O unilateral nephrectomy 12/04/2012  . H/O prostate cancer 12/04/2012  . S/P TURP 12/04/2012     Past Surgical History:  Procedure Laterality Date  . CHOLECYSTECTOMY    . PROSTATECTOMY    . RIGHT CATARACT EXTRACTION  2013  . RIGTH NEPHRECTOMY  2006  . TONSILLECTOMY    . TRANSURETHRAL RESECTION OF PROSTATE  1997       Home Medications    Prior to Admission medications   Medication Sig Start Date End Date Taking? Authorizing Provider  clobetasol cream (TEMOVATE) [id number] % Apply 1 application topically daily as needed (for itching).  07/14/15   Historical Provider, MD  fluticasone (FLONASE) 50 MCG/ACT nasal spray Place 2 sprays into both nostrils daily.  04/03/16   Historical Provider, MD  gabapentin (NEURONTIN) 300 MG capsule Take 300 mg by mouth 2 (two) times daily.    Historical Provider, MD  OPANA ER, CRUSH RESISTANT, 40 MG T12A Take 40 mg by mouth every 12 (twelve) hours. Reported on 04/14/2016 04/04/16   Historical Provider, MD  oxymorphone (OPANA) 10 MG tablet 10 mg 2 (two) times daily as needed for pain.  01/30/16   Historical Provider, MD  Polyethyl Glyc-Propyl Glyc PF (LUBRICANT EYE DROPS, PF,) 0.4-0.3 % SOLN Place 1 drop into both eyes daily as needed.    Historical Provider, MD    Family History Family History  Problem Relation Age of Onset  . Bone cancer Mother   . Hypertension Mother   .  Arthritis Mother     Social History Social History  Substance Use Topics  . Smoking status: Never Smoker  . Smokeless tobacco: Never Used  . Alcohol use No     Comment: QUIT ALCOHOL AT AGE 73     Allergies   Peanuts [peanut oil] and Coconut oil   Review of Systems Review of Systems  Constitutional: Negative for activity change, appetite change, fatigue and fever.  HENT: Negative for congestion.   Eyes: Negative for visual disturbance.  Respiratory: Negative for cough, chest tightness and shortness of breath.   Cardiovascular: Negative for chest pain.  Gastrointestinal: Negative for abdominal pain, nausea and vomiting.  Genitourinary: Negative for dysuria and  hematuria.  Musculoskeletal: Negative for arthralgias and back pain.  Skin: Negative for rash.  Neurological: Negative for dizziness, light-headedness and headaches.   A complete 10 system review of systems was obtained and all systems are negative except as noted in the HPI and PMH.    Physical Exam Updated Vital Signs BP 133/67 (BP Location: Left Arm)   Pulse 64   Temp 97.7 F (36.5 C) (Oral)   Resp 16   SpO2 95%   Physical Exam  Constitutional: He is oriented to person, place, and time. He appears well-developed and well-nourished. No distress.  HENT:  Head: Normocephalic and atraumatic.  Mouth/Throat: Oropharynx is clear and moist. No oropharyngeal exudate.  Eyes: Conjunctivae and EOM are normal. Pupils are equal, round, and reactive to light.  Neck: Normal range of motion. Neck supple.  No meningismus.  Cardiovascular: Normal rate, regular rhythm, normal heart sounds and intact distal pulses.   No murmur heard. Pulmonary/Chest: Effort normal and breath sounds normal. No respiratory distress.  Abdominal: Soft. There is no tenderness. There is no rebound and no guarding.  Musculoskeletal: Normal range of motion. He exhibits no edema or tenderness.  R paraspinal back pain without midline tenderness  Neurological: He is alert and oriented to person, place, and time. No cranial nerve deficit. He exhibits normal muscle tone. Coordination normal.  No ataxia on finger to nose bilaterally. No pronator drift. 5/5 strength throughout. CN 2-12 intact.Equal grip strength. Sensation intact.   Skin: Skin is warm.  Psychiatric: He has a normal mood and affect. His behavior is normal.  Nursing note and vitals reviewed.    ED Treatments / Results  Labs (all labs ordered are listed, but only abnormal results are displayed) Labs Reviewed  COMPREHENSIVE METABOLIC PANEL - Abnormal; Notable for the following:       Result Value   Calcium 8.7 (*)    Total Protein 5.9 (*)    All other  components within normal limits  ACETAMINOPHEN LEVEL - Abnormal; Notable for the following:    Acetaminophen (Tylenol), Serum <10 (*)    All other components within normal limits  CBC WITH DIFFERENTIAL/PLATELET  ETHANOL  SALICYLATE LEVEL    EKG  EKG Interpretation  Date/Time:  Saturday July 20 2016 07:22:11 EDT Ventricular Rate:  68 PR Interval:    QRS Duration: 145 QT Interval:  411 QTC Calculation: 438 R Axis:   -62 Text Interpretation:  Sinus rhythm Ventricular bigeminy RBBB and LAFB No significant change was found Confirmed by Wyvonnia Dusky  MD, Sharniece Gibbon ([id number]) on 07/20/2016 8:32:02 AM       Radiology No results found.  Procedures Procedures (including critical care time)  Medications Ordered in ED Medications - No data to display   Initial Impression / Assessment and Plan / ED Course  I have reviewed  the triage vital signs and the nursing notes.  Pertinent labs & imaging results that were available during my care of the patient were reviewed by me and considered in my medical decision making (see chart for details).  Clinical Course  Possible overdose of opana extend release. This was accidental. Patient with no signs of respiratory depression or neurological depression. Monitor in ED.  Patient is awake and alert. He denies any chest pain or shortness of breath. His EKG is stable. Labs are unremarkable.  Given the extended release preparation of Opana, poison center was contacted who recommended observation for 6 hours since last dose. This will be 12 PM.  Reassessed patient at 12:30 PM. Patient is awake and alert. He is tolerating by mouth and  Ambulatory. He appears stable for discharge. He was cautioned to use his pain medication carefully.  Return precautions discussed. Denies suicidal or homicidal thoughts.  Final Clinical Impressions(s) / ED Diagnoses   Final diagnoses:  Drug overdose, accidental or unintentional, initial encounter    New Prescriptions New  Prescriptions   No medications on file     Ezequiel Essex, MD 07/20/16 1537

## 2016-07-20 NOTE — Discharge Instructions (Signed)
Follow-up with your doctor.  Return to the ED if you develop new or worsening symptoms. °

## 2016-07-20 NOTE — ED Notes (Signed)
Poison Control  sts to watch for symptoms of opioid overdose such as Respiratory depression. No special labs need to be drawn.   EKG and narcan as need.  Observation for 6 hours from time of ingestion. If Narcan is needed, timer restarts.

## 2016-07-20 NOTE — ED Triage Notes (Signed)
Pt from [facility], sts that he wrote down that he took his pain medication - 40mg  ER Opana at 4:30am. Pt forgot he took it and wrote it down and sts he remembers taking another 40mg  dose at 5:55 am this morning. Pt denies any neurological symptoms, lethargy, N/V. A&Ox4.

## 2016-07-20 NOTE — ED Triage Notes (Signed)
Per EMS patient from [facility].  Patient takes Opana for chronic back pain and took his dose of 40mg  ER at 0430 and took the medication again at 0600.  Patient wanted to be brought to the hospital be monitored.

## 2016-07-23 DIAGNOSIS — L0390 Cellulitis, unspecified: Secondary | ICD-10-CM | POA: Diagnosis not present

## 2016-07-23 DIAGNOSIS — L309 Dermatitis, unspecified: Secondary | ICD-10-CM | POA: Diagnosis not present

## 2016-08-16 DIAGNOSIS — K5909 Other constipation: Secondary | ICD-10-CM | POA: Diagnosis not present

## 2016-08-16 DIAGNOSIS — Z6822 Body mass index (BMI) 22.0-22.9, adult: Secondary | ICD-10-CM | POA: Diagnosis not present

## 2016-08-16 DIAGNOSIS — Z23 Encounter for immunization: Secondary | ICD-10-CM | POA: Diagnosis not present

## 2016-08-16 DIAGNOSIS — M79673 Pain in unspecified foot: Secondary | ICD-10-CM | POA: Diagnosis not present

## 2016-08-28 ENCOUNTER — Emergency Department (HOSPITAL_COMMUNITY)
Admission: EM | Admit: 2016-08-28 | Discharge: 2016-08-28 | Disposition: A | Discharge status: Home / Self Care | Payer: Medicare Other | Attending: Emergency Medicine | Admitting: Emergency Medicine

## 2016-08-28 ENCOUNTER — Encounter (HOSPITAL_COMMUNITY): Payer: Self-pay | Admitting: Emergency Medicine

## 2016-08-28 DIAGNOSIS — N189 Chronic kidney disease, unspecified: Secondary | ICD-10-CM | POA: Diagnosis not present

## 2016-08-28 DIAGNOSIS — Z79899 Other long term (current) drug therapy: Secondary | ICD-10-CM | POA: Insufficient documentation

## 2016-08-28 DIAGNOSIS — M549 Dorsalgia, unspecified: Secondary | ICD-10-CM | POA: Diagnosis not present

## 2016-08-28 DIAGNOSIS — Z91010 Allergy to peanuts: Secondary | ICD-10-CM | POA: Diagnosis not present

## 2016-08-28 DIAGNOSIS — J45909 Unspecified asthma, uncomplicated: Secondary | ICD-10-CM | POA: Insufficient documentation

## 2016-08-28 DIAGNOSIS — K5900 Constipation, unspecified: Secondary | ICD-10-CM | POA: Insufficient documentation

## 2016-08-28 DIAGNOSIS — Z8546 Personal history of malignant neoplasm of prostate: Secondary | ICD-10-CM | POA: Diagnosis not present

## 2016-08-28 MED ORDER — POLYETHYLENE GLYCOL 3350 17 G PO PACK: 17.0000 g | PACK | Freq: Two times a day (BID) | ORAL | 0 refills | Status: DC

## 2016-08-28 NOTE — ED Notes (Signed)
Patient was alert, oriented and stable upon discharge. RN went over AVS and patient had no further questions.  

## 2016-08-28 NOTE — ED Triage Notes (Signed)
Pt reports Constipation x 3 days, LBM 09/24. Pt reports able to feel stool in rectum and unable to pass it. denies NV. Reports abdominal "discomfort. Alert and oriented x 4.

## 2016-08-28 NOTE — ED Provider Notes (Signed)
[place] DEPT Provider Note   CSN: [id number] Arrival date & time: 08/28/16  1534     History   Chief Complaint Chief Complaint  Patient presents with  . Constipation    HPI Casey Gates is a 82 y.o. male.  HPI Patient presents with constipation. States he hasn't had a bowel movement in 3 days. He has had some recent enemas [place] the beach but states that they did not have good results. States he feels as if there is stool right his rectum that needs to come out. States he was able to feel some there. States that does not hurt his abdomen. No fevers or chills. No nausea vomiting. He is on occasional mineral lax for his chronic opiate use. Has chronic back pain. Discussed with his primary care doctor, Dr. Ardeth Perfect and was told to come to the ER.,   Past Medical History:  Diagnosis Date  . Asthma    AS A CHILD  . Blind left eye   . Cancer [facility])    PROSTATE CANCER  . Chronic kidney disease   . Chronic pain syndrome   . Constipation   . DDD (degenerative disc disease), lumbar   . Insomnia   . Restless leg     Patient Active Problem List   Diagnosis Date Noted  . Chest pain 07/29/2015  . OSA on CPAP 07/10/2015  . CPAP use counseling 07/10/2015  . Excessive daytime sleepiness 02/22/2015  . Sleep apnea 02/22/2015  . Snoring 02/22/2015  . Nocturia more than twice per night 02/22/2015  . Syncope 12/31/2012  . Bifascicular bundle branch block 12/31/2012    Class: Acute  . Cellulitis and abscess of leg, except foot 12/04/2012  . Chronic pain 12/04/2012  . Kyphosis 12/04/2012  . H/O unilateral nephrectomy 12/04/2012  . H/O prostate cancer 12/04/2012  . S/P TURP 12/04/2012    Past Surgical History:  Procedure Laterality Date  . CHOLECYSTECTOMY    . PROSTATECTOMY    . RIGHT CATARACT EXTRACTION  2013  . RIGTH NEPHRECTOMY  2006  . TONSILLECTOMY    . TRANSURETHRAL RESECTION OF PROSTATE  1997       Home Medications    Prior to Admission  medications   Medication Sig Start Date End Date Taking? Authorizing Provider  Artificial Tear Ointment (DRY EYES OP) Place 1 drop into both eyes daily as needed (for dry eyes).   Yes Historical Provider, MD  clobetasol cream (TEMOVATE) [id number] % Apply 1 application topically daily as needed (for itching).  07/14/15  Yes Historical Provider, MD  docusate sodium (COLACE) 100 MG capsule Take 100 mg by mouth daily as needed for mild constipation or moderate constipation.   Yes Historical Provider, MD  gabapentin (NEURONTIN) 300 MG capsule Take 300 mg by mouth 2 (two) times daily.   Yes Historical Provider, MD  influenza vac recombinant HA trivalent (FLUBLOK) injection Inject 0.5 mLs into the muscle once.   Yes Historical Provider, MD  oxymorphone (OPANA) 10 MG tablet Take 10 mg by mouth 2 (two) times daily as needed for pain.  01/30/16  Yes Historical Provider, MD  Oxymorphone HCl, Crush Resist, 40 MG T12A Take 40 mg by mouth every 12 (twelve) hours.   Yes Historical Provider, MD  triamcinolone ointment (KENALOG) 0.1 % Apply 1 application topically daily as needed for itching. 07/23/16  Yes Historical Provider, MD  polyethylene glycol (MIRALAX / GLYCOLAX) packet Take 17 g by mouth 2 (two) times daily. 08/28/16   [name],  MD    Family History Family History  Problem Relation Age of Onset  . Bone cancer Mother   . Hypertension Mother   . Arthritis Mother     Social History Social History  Substance Use Topics  . Smoking status: Never Smoker  . Smokeless tobacco: Never Used  . Alcohol use No     Comment: QUIT ALCOHOL AT AGE 64     Allergies   Peanuts [peanut oil] and Coconut oil   Review of Systems Review of Systems  Constitutional: Negative for appetite change.  HENT: Negative for facial swelling.   Respiratory: Negative for shortness of breath.   Cardiovascular: Negative for chest pain.  Gastrointestinal: Positive for constipation. Negative for abdominal distention and  abdominal pain.  Genitourinary: Negative for flank pain.  Musculoskeletal: Positive for back pain.  Neurological: Negative for syncope.  Hematological: Negative for adenopathy.  Psychiatric/Behavioral: Negative for confusion.     Physical Exam Updated Vital Signs BP 121/55 (BP Location: Right Arm)   Pulse 85   Temp 98.5 F (36.9 C) (Oral)   Resp 18   SpO2 92%   Physical Exam  Constitutional: He appears well-developed.  Eyes: EOM are normal.  Neck: Neck supple.  Cardiovascular: Normal rate.   Pulmonary/Chest: Effort normal.  Abdominal: There is no tenderness.  Neurological: He is alert.  Skin: Skin is warm. Capillary refill takes less than 2 seconds.  Psychiatric: He has a normal mood and affect.     ED Treatments / Results  Labs (all labs ordered are listed, but only abnormal results are displayed) Labs Reviewed - No data to display  EKG  EKG Interpretation None       Radiology No results found.  Procedures Procedures (including critical care time)  Medications Ordered in ED Medications - No data to display   Initial Impression / Assessment and Plan / ED Course  I have reviewed the triage vital signs and the nursing notes.  Pertinent labs & imaging results that were available during my care of the patient were reviewed by me and considered in my medical decision making (see chart for details).  Clinical Course    Patient with constipation. Some stool in rectal vault but no obstruction. Has had a small bowel movement. No abdominal pain. Will increase patient's parallax and him discharge home.  Final Clinical Impressions(s) / ED Diagnoses   Final diagnoses:  Constipation, unspecified constipation type    New Prescriptions Current Discharge Medication List       [name], MD 08/28/16 1844

## 2016-08-30 DIAGNOSIS — G894 Chronic pain syndrome: Secondary | ICD-10-CM | POA: Diagnosis not present

## 2016-08-30 DIAGNOSIS — M4806 Spinal stenosis, lumbar region: Secondary | ICD-10-CM | POA: Diagnosis not present

## 2016-08-30 DIAGNOSIS — M5136 Other intervertebral disc degeneration, lumbar region: Secondary | ICD-10-CM | POA: Diagnosis not present

## 2016-09-12 DIAGNOSIS — M5136 Other intervertebral disc degeneration, lumbar region: Secondary | ICD-10-CM | POA: Diagnosis not present

## 2016-09-12 DIAGNOSIS — M48061 Spinal stenosis, lumbar region without neurogenic claudication: Secondary | ICD-10-CM | POA: Diagnosis not present

## 2016-09-12 DIAGNOSIS — G894 Chronic pain syndrome: Secondary | ICD-10-CM | POA: Diagnosis not present

## 2016-09-19 DIAGNOSIS — S40819A Abrasion of unspecified upper arm, initial encounter: Secondary | ICD-10-CM | POA: Diagnosis not present

## 2016-09-23 ENCOUNTER — Ambulatory Visit (INDEPENDENT_AMBULATORY_CARE_PROVIDER_SITE_OTHER): Payer: Medicare Other | Admitting: Podiatry

## 2016-09-23 ENCOUNTER — Encounter: Payer: Self-pay | Admitting: Podiatry

## 2016-09-23 DIAGNOSIS — M79675 Pain in left toe(s): Secondary | ICD-10-CM

## 2016-09-23 DIAGNOSIS — B351 Tinea unguium: Secondary | ICD-10-CM | POA: Diagnosis not present

## 2016-09-23 DIAGNOSIS — M79674 Pain in right toe(s): Secondary | ICD-10-CM

## 2016-09-23 NOTE — Progress Notes (Signed)
   Subjective:    Patient ID: Casey Gates, male    DOB: [birth year], 82 y.o.   MRN: [id number]  HPI  82 year old male presents the office today for concerns of thick, painful, elongated toenails that he cannot trim himself to the right second digit toenail. His nails are painful pressure in shoe gear. Denies any redness or drainage from the nails. No other complaints at this time.   Review of Systems  Gastrointestinal: Positive for constipation.  Musculoskeletal: Positive for back pain.  Skin:       Prone to infection  Allergic/Immunologic: Positive for food allergies.       Objective:   Physical Exam General: AAO x3, NAD  Dermatological: Nails are hypertrophic, dystrophic, brittle, discolored, elongated 10. No surrounding redness or drainage. Tenderness nails 1-5 bilaterally. No open lesions or pre-ulcerative lesions are identified today.  Vascular: Dorsalis Pedis artery and Posterior Tibial artery pedal pulses are palpable bilateral with immedate capillary fill time. There is no pain with calf compression, swelling, warmth, erythema.   Neruologic: Grossly intact via light touch bilateral. Vibratory intact via tuning fork bilateral. Protective threshold with Semmes Wienstein monofilament intact to all pedal sites bilateral.   Musculoskeletal: No pain, crepitus, or limitation noted with foot and ankle range of motion bilateral. Muscular strength 5/5 in all groups tested bilateral.  Gait: Unassisted, Nonantalgic.      Assessment & Plan:  82 year old male symptomatic onychomycosis -Treatment options discussed including all alternatives, risks, and complications -Etiology of symptoms were discussed -Nails debrided 10 without complications or bleeding. -Daily foot inspection -Follow-up in 3 months or sooner if any problems arise. In the meantime, encouraged to call the office with any questions, concerns, change in symptoms.   Celesta Gentile, DPM

## 2016-09-26 DIAGNOSIS — M5136 Other intervertebral disc degeneration, lumbar region: Secondary | ICD-10-CM | POA: Diagnosis not present

## 2016-09-26 DIAGNOSIS — M48061 Spinal stenosis, lumbar region without neurogenic claudication: Secondary | ICD-10-CM | POA: Diagnosis not present

## 2016-09-26 DIAGNOSIS — G894 Chronic pain syndrome: Secondary | ICD-10-CM | POA: Diagnosis not present

## 2016-10-21 ENCOUNTER — Emergency Department (HOSPITAL_COMMUNITY)
Admission: EM | Admit: 2016-10-21 | Discharge: 2016-10-22 | Disposition: A | Discharge status: Home / Self Care | Payer: Medicare Other | Attending: Emergency Medicine | Admitting: Emergency Medicine

## 2016-10-21 DIAGNOSIS — Y929 Unspecified place or not applicable: Secondary | ICD-10-CM | POA: Diagnosis not present

## 2016-10-21 DIAGNOSIS — J45909 Unspecified asthma, uncomplicated: Secondary | ICD-10-CM | POA: Diagnosis not present

## 2016-10-21 DIAGNOSIS — T148XXA Other injury of unspecified body region, initial encounter: Secondary | ICD-10-CM

## 2016-10-21 DIAGNOSIS — W19XXXA Unspecified fall, initial encounter: Secondary | ICD-10-CM

## 2016-10-21 DIAGNOSIS — S199XXA Unspecified injury of neck, initial encounter: Secondary | ICD-10-CM | POA: Diagnosis not present

## 2016-10-21 DIAGNOSIS — S0083XA Contusion of other part of head, initial encounter: Secondary | ICD-10-CM | POA: Diagnosis not present

## 2016-10-21 DIAGNOSIS — Z91010 Allergy to peanuts: Secondary | ICD-10-CM | POA: Insufficient documentation

## 2016-10-21 DIAGNOSIS — S0993XA Unspecified injury of face, initial encounter: Secondary | ICD-10-CM | POA: Diagnosis not present

## 2016-10-21 DIAGNOSIS — S51811A Laceration without foreign body of right forearm, initial encounter: Secondary | ICD-10-CM | POA: Diagnosis not present

## 2016-10-21 DIAGNOSIS — Y999 Unspecified external cause status: Secondary | ICD-10-CM | POA: Insufficient documentation

## 2016-10-21 DIAGNOSIS — Z8546 Personal history of malignant neoplasm of prostate: Secondary | ICD-10-CM | POA: Insufficient documentation

## 2016-10-21 DIAGNOSIS — S51812A Laceration without foreign body of left forearm, initial encounter: Secondary | ICD-10-CM | POA: Diagnosis not present

## 2016-10-21 DIAGNOSIS — T07XXXA Unspecified multiple injuries, initial encounter: Secondary | ICD-10-CM | POA: Diagnosis not present

## 2016-10-21 DIAGNOSIS — N189 Chronic kidney disease, unspecified: Secondary | ICD-10-CM | POA: Insufficient documentation

## 2016-10-21 DIAGNOSIS — S0990XA Unspecified injury of head, initial encounter: Secondary | ICD-10-CM | POA: Insufficient documentation

## 2016-10-21 DIAGNOSIS — Y939 Activity, unspecified: Secondary | ICD-10-CM | POA: Diagnosis not present

## 2016-10-21 DIAGNOSIS — S51011A Laceration without foreign body of right elbow, initial encounter: Secondary | ICD-10-CM | POA: Diagnosis not present

## 2016-10-21 DIAGNOSIS — W01198A Fall on same level from slipping, tripping and stumbling with subsequent striking against other object, initial encounter: Secondary | ICD-10-CM | POA: Insufficient documentation

## 2016-10-21 DIAGNOSIS — Y9289 Other specified places as the place of occurrence of the external cause: Secondary | ICD-10-CM | POA: Diagnosis not present

## 2016-10-21 DIAGNOSIS — R6884 Jaw pain: Secondary | ICD-10-CM | POA: Diagnosis not present

## 2016-10-21 DIAGNOSIS — S59901A Unspecified injury of right elbow, initial encounter: Secondary | ICD-10-CM | POA: Diagnosis present

## 2016-10-21 HISTORY — DX: Scoliosis, unspecified: M41.9

## 2016-10-21 NOTE — ED Notes (Signed)
Bed: [id number] Expected date: 10/21/16 Expected time:  Means of arrival:  Comments: EMS: Fall

## 2016-10-22 ENCOUNTER — Emergency Department (HOSPITAL_COMMUNITY): Payer: Medicare Other

## 2016-10-22 ENCOUNTER — Encounter (HOSPITAL_COMMUNITY): Payer: Self-pay | Admitting: Emergency Medicine

## 2016-10-22 DIAGNOSIS — S0993XA Unspecified injury of face, initial encounter: Secondary | ICD-10-CM | POA: Diagnosis not present

## 2016-10-22 DIAGNOSIS — S51011A Laceration without foreign body of right elbow, initial encounter: Secondary | ICD-10-CM | POA: Diagnosis not present

## 2016-10-22 DIAGNOSIS — R6884 Jaw pain: Secondary | ICD-10-CM | POA: Diagnosis not present

## 2016-10-22 DIAGNOSIS — S0990XA Unspecified injury of head, initial encounter: Secondary | ICD-10-CM | POA: Diagnosis not present

## 2016-10-22 DIAGNOSIS — S199XXA Unspecified injury of neck, initial encounter: Secondary | ICD-10-CM | POA: Diagnosis not present

## 2016-10-22 MED ORDER — CEPHALEXIN 500 MG PO CAPS
500.0000 mg | ORAL_CAPSULE | Freq: Once | ORAL | Status: AC
  Administered 2016-10-22: 500 mg via ORAL
  Filled 2016-10-22: qty 1

## 2016-10-22 MED ORDER — CEPHALEXIN 500 MG PO CAPS: 500.0000 mg | ORAL_CAPSULE | Freq: Four times a day (QID) | ORAL | 0 refills | Status: DC

## 2016-10-22 NOTE — ED Notes (Signed)
ED Provider at bedside. 

## 2016-10-22 NOTE — Discharge Instructions (Signed)
Take Keflex as prescribed to prevent wound infection. Keep your wounds clean and dry. Take tylenol for pain as needed. Follow up with your primary care doctor for a recheck of symptoms.

## 2016-10-22 NOTE — ED Triage Notes (Signed)
Pt comes from [facility] via EMS complaints of an unwitnessed fall.  A&O x4. Denies LOC, neck pain, or back pain.  Denies blood thinner use.  Has some skin tears on left and right forearms.

## 2016-10-22 NOTE — ED Provider Notes (Signed)
Medical screening examination/treatment/procedure(s) were conducted as a shared visit with non-physician practitioner(s) and myself.  I personally evaluated the patient during the encounter.   EKG Interpretation None      By signing my name below, I, Arianna Nassar, attest that this documentation has been prepared under the direction and in the presence of Anayia Eugene, MD.  Electronically Signed: Julien Nordmann, ED Scribe. 10/22/16. 12:36 AM.    HPI Comments: Casey Gates is a 82 y.o. male who presents to the Emergency Department by EMS s/p and unwitnessed fall that occurred PTA. He notes slipping on a wet spot in his bathroom, falling, and hitting his right jaw on the commode. He complains of right jaw pain and has multiple skin tears on his upper extremities. Pt did not lose consciousness.     PE: Skin: two skin tears, one on the right wrist and one of the right elbow RRR, lungs clear, good bowel sounds, abdomen soft, no guarding, pelvis is stable, no foreshortening of rotation, intact distal pulses, PERRL, no battle signs or raccoon eyes, no hemotympanum on left or right, face is stable  I personally performed the services described in this documentation, which was scribed in my presence. The recorded information has been reviewed and is accurate.     Veatrice Kells, MD 10/22/16 [phone]

## 2016-10-22 NOTE — ED Notes (Signed)
Patient transported to CT 

## 2016-10-22 NOTE — ED Provider Notes (Signed)
[name] DEPT Provider Note   CSN: [id number] Arrival date & time: 10/21/16  2358  By signing my name below, I, Hansel Feinstein, attest that this documentation has been prepared under the direction and in the presence of [employer], PA-C. Electronically Signed: Hansel Feinstein, ED Scribe. 10/22/16. 12:27 AM.    History   Chief Complaint Chief Complaint  Patient presents with  . Fall    HPI Casey Gates is a 82 y.o. male brought in by ambulance who presents to the Emergency Department for evaluation of injuries s/p unwitnessed fall that occurred just PTA. Pt states slipped on a wet area on his bathroom floor, fell forward and struck his right jaw on the commode. He denies LOC or additional head injury. He also complains of skin tears to the right elbow, right wrist and left wrist, as well as bilateral arm pain. Pt states his jaw pain is worsened with jaw movement. He is not currently on any anticoagulants. He denies nausea, additional injuries. Son notes that pt is prone to skin infections.   The history is provided by the patient. No language interpreter was used.    Past Medical History:  Diagnosis Date  . Asthma    AS A CHILD  . Blind left eye   . Cancer [facility])    PROSTATE CANCER  . Chronic kidney disease   . Chronic pain syndrome   . Constipation   . DDD (degenerative disc disease), lumbar   . Insomnia   . Kyphoscoliosis   . Restless leg     Patient Active Problem List   Diagnosis Date Noted  . Chest pain 07/29/2015  . OSA on CPAP 07/10/2015  . CPAP use counseling 07/10/2015  . Excessive daytime sleepiness 02/22/2015  . Sleep apnea 02/22/2015  . Snoring 02/22/2015  . Nocturia more than twice per night 02/22/2015  . Syncope 12/31/2012  . Bifascicular bundle branch block 12/31/2012    Class: Acute  . Cellulitis and abscess of leg, except foot 12/04/2012  . Chronic pain 12/04/2012  . Kyphosis 12/04/2012  . H/O unilateral nephrectomy 12/04/2012  . H/O prostate  cancer 12/04/2012  . S/P TURP 12/04/2012    Past Surgical History:  Procedure Laterality Date  . CHOLECYSTECTOMY    . PROSTATECTOMY    . RIGHT CATARACT EXTRACTION  2013  . RIGTH NEPHRECTOMY  2006  . TONSILLECTOMY    . TRANSURETHRAL RESECTION OF PROSTATE  1997       Home Medications    Prior to Admission medications   Medication Sig Start Date End Date Taking? Authorizing Provider  Artificial Tear Ointment (DRY EYES OP) Place 1 drop into both eyes daily as needed (for dry eyes).    Historical Provider, MD  cephALEXin (KEFLEX) 500 MG capsule Take 1 capsule (500 mg total) by mouth 4 (four) times daily. 10/22/16   Antonietta Breach, PA-C  clobetasol cream (TEMOVATE) [id number] % Apply 1 application topically daily as needed (for itching).  07/14/15   Historical Provider, MD  docusate sodium (COLACE) 100 MG capsule Take 100 mg by mouth daily as needed for mild constipation or moderate constipation.    Historical Provider, MD  gabapentin (NEURONTIN) 300 MG capsule Take 300 mg by mouth 2 (two) times daily.    Historical Provider, MD  influenza vac recombinant HA trivalent (FLUBLOK) injection Inject 0.5 mLs into the muscle once.    Historical Provider, MD  oxymorphone (OPANA) 10 MG tablet Take 10 mg by mouth 2 (two) times daily as needed for  pain.  01/30/16   Historical Provider, MD  Oxymorphone HCl, Crush Resist, 40 MG T12A Take 40 mg by mouth every 12 (twelve) hours.    Historical Provider, MD  polyethylene glycol (MIRALAX / GLYCOLAX) packet Take 17 g by mouth 2 (two) times daily. 08/28/16   [name], MD  triamcinolone ointment (KENALOG) 0.1 % Apply 1 application topically daily as needed for itching. 07/23/16   Historical Provider, MD    Family History Family History  Problem Relation Age of Onset  . Bone cancer Mother   . Hypertension Mother   . Arthritis Mother     Social History Social History  Substance Use Topics  . Smoking status: Never Smoker  . Smokeless tobacco: Never Used    . Alcohol use No     Comment: QUIT ALCOHOL AT AGE 12     Allergies   Peanuts [peanut oil] and Coconut oil   Review of Systems Review of Systems  Gastrointestinal: Negative for nausea.  Musculoskeletal: Positive for arthralgias.  Skin: Positive for wound.  Neurological: Negative for syncope.  All other systems reviewed and are negative.    Physical Exam Updated Vital Signs BP 158/88 (BP Location: Left Arm)   Pulse 74   Temp 98.7 F (37.1 C) (Oral)   Resp 18   Ht 5\' 6"  (1.676 m)   Wt 68 kg   SpO2 92%   BMI 24.21 kg/m   Physical Exam  Constitutional: He is oriented to person, place, and time. He appears well-developed and well-nourished. No distress.  Nontoxic appearing and in no distress  HENT:  Head: Normocephalic and atraumatic.  Mild tenderness along the right mandibular angle. No crepitus. Symmetric jaw opening. No significant swelling. No Battle sign or raccoons eyes. No skull and stability.  Eyes: Conjunctivae and EOM are normal. No scleral icterus.  Neck: Normal range of motion.  No bony deformity, step-offs, or crepitus to the cervical midline.  Cardiovascular: Normal rate, regular rhythm and intact distal pulses.   Pulmonary/Chest: Effort normal. No respiratory distress. He has no wheezes.  Respirations even and unlabored. Lungs clear to auscultation bilaterally.  Musculoskeletal: Normal range of motion.  Neurological: He is alert and oriented to person, place, and time. He exhibits normal muscle tone. Coordination normal.  GCS 15. No focal neurologic deficits appreciated.  Skin: Skin is warm and dry. No rash noted. He is not diaphoretic. No erythema. No pallor.  Skin tear noted to the right elbow as well as to the bilateral forearms on the volar aspect. Bleeding controlled.  Psychiatric: He has a normal mood and affect. His behavior is normal.  Nursing note and vitals reviewed.    ED Treatments / Results   DIAGNOSTIC STUDIES: Oxygen Saturation is 95%  on RA, adequate by my interpretation.    COORDINATION OF CARE: 12:25 AM Discussed treatment plan with pt at bedside which includes wound care, CT and pt agreed to plan.    Labs (all labs ordered are listed, but only abnormal results are displayed) Labs Reviewed - No data to display  EKG  EKG Interpretation None       Radiology Ct Head Wo Contrast  Result Date: 10/22/2016 CLINICAL DATA:  Status post unwitnessed fall. Slipped on wet small on bathroom floor. Hit jaw on commode, with right jaw pain. Concern for head or cervical spine injury. Initial encounter. EXAM: CT HEAD WITHOUT CONTRAST CT MAXILLOFACIAL WITHOUT CONTRAST CT CERVICAL SPINE WITHOUT CONTRAST TECHNIQUE: Multidetector CT imaging of the head, cervical spine, and maxillofacial  structures were performed using the standard protocol without intravenous contrast. Multiplanar CT image reconstructions of the cervical spine and maxillofacial structures were also generated. COMPARISON:  CT of the head performed 12/31/2012 FINDINGS: CT HEAD FINDINGS Brain: No evidence of acute infarction, hemorrhage, hydrocephalus, extra-axial collection or mass lesion/mass effect. Prominence of the ventricles and sulci reflects mild to moderate cortical volume loss. Mild cerebellar atrophy is noted. The brainstem and fourth ventricle are within normal limits. The basal ganglia are unremarkable in appearance. The cerebral hemispheres demonstrate grossly normal gray-white differentiation. No mass effect or midline shift is seen. Vascular: No hyperdense vessel or unexpected calcification. Skull: There is no evidence of fracture; visualized osseous structures are unremarkable in appearance. Other: No significant soft tissue abnormalities are seen. CT MAXILLOFACIAL FINDINGS Osseous: There is no evidence of fracture or dislocation. The maxilla and mandible appear intact. The nasal bone is unremarkable in appearance. There is chronic absence of multiple maxillary and  mandibular teeth, with underlying dental caries. Orbits: The orbits are intact bilaterally. Sinuses: A mucus retention cyst or polyp is noted at the left maxillary sinus, with mild mucosal thickening at the left maxillary sinus. The remaining visualized paranasal sinuses and mastoid air cells are well-aerated. Soft tissues: No significant soft tissue abnormalities are seen. The parapharyngeal fat planes are preserved. The nasopharynx, oropharynx and hypopharynx are unremarkable in appearance. The visualized portions of the valleculae and piriform sinuses are grossly unremarkable. The parotid and submandibular glands are within normal limits. No cervical lymphadenopathy is seen. CT CERVICAL SPINE FINDINGS Alignment: There is mild grade 1 anterolisthesis of C3 on C4 and of C4 on C5. Skull base and vertebrae: No acute fracture. No primary bone lesion or focal pathologic process. Soft tissues and spinal canal: No prevertebral fluid or swelling. No visible canal hematoma. Disc levels: Multilevel disc space narrowing is noted along the lower cervical and upper thoracic spine, with scattered anterior and posterior disc osteophyte complexes. Underlying facet disease is noted at the mid cervical spine. Upper chest: Minimal calcification is noted at the right carotid bifurcation. The thyroid gland is unremarkable. The visualized lung apices are clear. Other: No additional soft tissue abnormalities are seen. IMPRESSION: 1. No evidence of traumatic intracranial injury or fracture. 2. No evidence of fracture or dislocation with regard to the maxillofacial structures. 3. No evidence of acute fracture or subluxation along the cervical spine. 4. Mild to moderate cortical volume loss noted. 5. Mucus retention cyst or polyp at the left maxillary sinus, with mild mucosal thickening at the left maxillary sinus. 6. Mild degenerative change along the cervical and upper thoracic spine. 7. Minimal calcification at the right carotid  bifurcation. Electronically Signed   By: Garald Balding M.D.   On: 10/22/2016 02:05   Ct Cervical Spine Wo Contrast  Result Date: 10/22/2016 CLINICAL DATA:  Status post unwitnessed fall. Slipped on wet small on bathroom floor. Hit jaw on commode, with right jaw pain. Concern for head or cervical spine injury. Initial encounter. EXAM: CT HEAD WITHOUT CONTRAST CT MAXILLOFACIAL WITHOUT CONTRAST CT CERVICAL SPINE WITHOUT CONTRAST TECHNIQUE: Multidetector CT imaging of the head, cervical spine, and maxillofacial structures were performed using the standard protocol without intravenous contrast. Multiplanar CT image reconstructions of the cervical spine and maxillofacial structures were also generated. COMPARISON:  CT of the head performed 12/31/2012 FINDINGS: CT HEAD FINDINGS Brain: No evidence of acute infarction, hemorrhage, hydrocephalus, extra-axial collection or mass lesion/mass effect. Prominence of the ventricles and sulci reflects mild to moderate cortical volume loss. Mild  cerebellar atrophy is noted. The brainstem and fourth ventricle are within normal limits. The basal ganglia are unremarkable in appearance. The cerebral hemispheres demonstrate grossly normal gray-white differentiation. No mass effect or midline shift is seen. Vascular: No hyperdense vessel or unexpected calcification. Skull: There is no evidence of fracture; visualized osseous structures are unremarkable in appearance. Other: No significant soft tissue abnormalities are seen. CT MAXILLOFACIAL FINDINGS Osseous: There is no evidence of fracture or dislocation. The maxilla and mandible appear intact. The nasal bone is unremarkable in appearance. There is chronic absence of multiple maxillary and mandibular teeth, with underlying dental caries. Orbits: The orbits are intact bilaterally. Sinuses: A mucus retention cyst or polyp is noted at the left maxillary sinus, with mild mucosal thickening at the left maxillary sinus. The remaining  visualized paranasal sinuses and mastoid air cells are well-aerated. Soft tissues: No significant soft tissue abnormalities are seen. The parapharyngeal fat planes are preserved. The nasopharynx, oropharynx and hypopharynx are unremarkable in appearance. The visualized portions of the valleculae and piriform sinuses are grossly unremarkable. The parotid and submandibular glands are within normal limits. No cervical lymphadenopathy is seen. CT CERVICAL SPINE FINDINGS Alignment: There is mild grade 1 anterolisthesis of C3 on C4 and of C4 on C5. Skull base and vertebrae: No acute fracture. No primary bone lesion or focal pathologic process. Soft tissues and spinal canal: No prevertebral fluid or swelling. No visible canal hematoma. Disc levels: Multilevel disc space narrowing is noted along the lower cervical and upper thoracic spine, with scattered anterior and posterior disc osteophyte complexes. Underlying facet disease is noted at the mid cervical spine. Upper chest: Minimal calcification is noted at the right carotid bifurcation. The thyroid gland is unremarkable. The visualized lung apices are clear. Other: No additional soft tissue abnormalities are seen. IMPRESSION: 1. No evidence of traumatic intracranial injury or fracture. 2. No evidence of fracture or dislocation with regard to the maxillofacial structures. 3. No evidence of acute fracture or subluxation along the cervical spine. 4. Mild to moderate cortical volume loss noted. 5. Mucus retention cyst or polyp at the left maxillary sinus, with mild mucosal thickening at the left maxillary sinus. 6. Mild degenerative change along the cervical and upper thoracic spine. 7. Minimal calcification at the right carotid bifurcation. Electronically Signed   By: Garald Balding M.D.   On: 10/22/2016 02:05   Ct Maxillofacial Wo Contrast  Result Date: 10/22/2016 CLINICAL DATA:  Status post unwitnessed fall. Slipped on wet small on bathroom floor. Hit jaw on commode,  with right jaw pain. Concern for head or cervical spine injury. Initial encounter. EXAM: CT HEAD WITHOUT CONTRAST CT MAXILLOFACIAL WITHOUT CONTRAST CT CERVICAL SPINE WITHOUT CONTRAST TECHNIQUE: Multidetector CT imaging of the head, cervical spine, and maxillofacial structures were performed using the standard protocol without intravenous contrast. Multiplanar CT image reconstructions of the cervical spine and maxillofacial structures were also generated. COMPARISON:  CT of the head performed 12/31/2012 FINDINGS: CT HEAD FINDINGS Brain: No evidence of acute infarction, hemorrhage, hydrocephalus, extra-axial collection or mass lesion/mass effect. Prominence of the ventricles and sulci reflects mild to moderate cortical volume loss. Mild cerebellar atrophy is noted. The brainstem and fourth ventricle are within normal limits. The basal ganglia are unremarkable in appearance. The cerebral hemispheres demonstrate grossly normal gray-white differentiation. No mass effect or midline shift is seen. Vascular: No hyperdense vessel or unexpected calcification. Skull: There is no evidence of fracture; visualized osseous structures are unremarkable in appearance. Other: No significant soft tissue abnormalities are seen.  CT MAXILLOFACIAL FINDINGS Osseous: There is no evidence of fracture or dislocation. The maxilla and mandible appear intact. The nasal bone is unremarkable in appearance. There is chronic absence of multiple maxillary and mandibular teeth, with underlying dental caries. Orbits: The orbits are intact bilaterally. Sinuses: A mucus retention cyst or polyp is noted at the left maxillary sinus, with mild mucosal thickening at the left maxillary sinus. The remaining visualized paranasal sinuses and mastoid air cells are well-aerated. Soft tissues: No significant soft tissue abnormalities are seen. The parapharyngeal fat planes are preserved. The nasopharynx, oropharynx and hypopharynx are unremarkable in appearance. The  visualized portions of the valleculae and piriform sinuses are grossly unremarkable. The parotid and submandibular glands are within normal limits. No cervical lymphadenopathy is seen. CT CERVICAL SPINE FINDINGS Alignment: There is mild grade 1 anterolisthesis of C3 on C4 and of C4 on C5. Skull base and vertebrae: No acute fracture. No primary bone lesion or focal pathologic process. Soft tissues and spinal canal: No prevertebral fluid or swelling. No visible canal hematoma. Disc levels: Multilevel disc space narrowing is noted along the lower cervical and upper thoracic spine, with scattered anterior and posterior disc osteophyte complexes. Underlying facet disease is noted at the mid cervical spine. Upper chest: Minimal calcification is noted at the right carotid bifurcation. The thyroid gland is unremarkable. The visualized lung apices are clear. Other: No additional soft tissue abnormalities are seen. IMPRESSION: 1. No evidence of traumatic intracranial injury or fracture. 2. No evidence of fracture or dislocation with regard to the maxillofacial structures. 3. No evidence of acute fracture or subluxation along the cervical spine. 4. Mild to moderate cortical volume loss noted. 5. Mucus retention cyst or polyp at the left maxillary sinus, with mild mucosal thickening at the left maxillary sinus. 6. Mild degenerative change along the cervical and upper thoracic spine. 7. Minimal calcification at the right carotid bifurcation. Electronically Signed   By: Garald Balding M.D.   On: 10/22/2016 02:05    Procedures Procedures (including critical care time)  Medications Ordered in ED Medications  cephALEXin (KEFLEX) capsule 500 mg (500 mg Oral Given 10/22/16 0229)     Initial Impression / Assessment and Plan / ED Course  I have reviewed the triage vital signs and the nursing notes.  Pertinent labs & imaging results that were available during my care of the patient were reviewed by me and considered in my  medical decision making (see chart for details).  Clinical Course     82 year old male since to the emergency department after a mechanical fall. He denies a loss of consciousness. Asian with skin tears to his bilateral upper extremities. He reports striking the right side of his face. He complains of pain mostly to the right side of his jaw. Patient with symmetric jaw opening. Physical exam reassuring. CT scans obtained which are negative for acute intracranial or facial process. Skin tears dressed with Steri-Strips and Coband. Son expresses concern over skin infection as patient is reportedly prone to these with wounds. Will start on Keflex.. Patient has been advised to take Tylenol as needed for pain and follow-up with his primary care doctor for any persistent symptoms. Return precautions discussed and provided. Patient discharged in stable condition with no unaddressed concerns.   Final Clinical Impressions(s) / ED Diagnoses   Final diagnoses:  Fall, initial encounter  Contusion of face, initial encounter  Multiple skin tears    New Prescriptions Discharge Medication List as of 10/22/2016  2:14 AM  START taking these medications   Details  cephALEXin (KEFLEX) 500 MG capsule Take 1 capsule (500 mg total) by mouth 4 (four) times daily., Starting Tue 10/22/2016, Print        I personally performed the services described in this documentation, which was scribed in my presence. The recorded information has been reviewed and is accurate.      Antonietta Breach, PA-C 10/22/16 0308    Veatrice Kells, MD 10/22/16 0340    Veatrice Kells, MD 10/22/16 [id number]

## 2016-11-01 DIAGNOSIS — L821 Other seborrheic keratosis: Secondary | ICD-10-CM | POA: Diagnosis not present

## 2016-11-01 DIAGNOSIS — S70211A Abrasion, right hip, initial encounter: Secondary | ICD-10-CM | POA: Diagnosis not present

## 2016-11-26 DIAGNOSIS — A084 Viral intestinal infection, unspecified: Secondary | ICD-10-CM | POA: Diagnosis not present

## 2016-11-26 DIAGNOSIS — J40 Bronchitis, not specified as acute or chronic: Secondary | ICD-10-CM | POA: Diagnosis not present

## 2016-11-26 DIAGNOSIS — Z6823 Body mass index (BMI) 23.0-23.9, adult: Secondary | ICD-10-CM | POA: Diagnosis not present

## 2016-12-17 DIAGNOSIS — L080 Pyoderma: Secondary | ICD-10-CM | POA: Diagnosis not present

## 2016-12-27 ENCOUNTER — Encounter: Payer: Self-pay | Admitting: Podiatry

## 2016-12-27 ENCOUNTER — Ambulatory Visit (INDEPENDENT_AMBULATORY_CARE_PROVIDER_SITE_OTHER): Payer: Medicare Other | Admitting: Podiatry

## 2016-12-27 DIAGNOSIS — B351 Tinea unguium: Secondary | ICD-10-CM

## 2016-12-27 DIAGNOSIS — M79675 Pain in left toe(s): Secondary | ICD-10-CM | POA: Diagnosis not present

## 2016-12-27 DIAGNOSIS — M79674 Pain in right toe(s): Secondary | ICD-10-CM | POA: Diagnosis not present

## 2016-12-27 NOTE — Progress Notes (Signed)
Subjective: 83 y.o. returns the office today for painful, elongated, thickened toenails which he cannot trim himself. Denies any redness or drainage around the nails. Denies any surrounding redness or drainage. He has a wound on the left leg with a bandage he does not want changed as it is being treated by Dr. Ouida Sills. Denies any acute changes since last appointment and no new complaints today. Denies any systemic complaints such as fevers, chills, nausea, vomiting.   Objective: AAO 3, NAD DP/PT pulses palpable, CRT less than 3 seconds Nails hypertrophic, dystrophic, elongated, brittle, discolored 10. There is tenderness overlying the nails 1-5 bilaterally. There is no surrounding erythema or drainage along the nail sites. No open lesions or pre-ulcerative lesions are identified. No other areas of tenderness bilateral lower extremities. No overlying edema, erythema, increased warmth. No pain with calf compression, swelling, warmth, erythema.  Assessment: Patient presents with symptomatic onychomycosis  Plan: -Treatment options including alternatives, risks, complications were discussed -Nails sharply debrided 10 without complication/bleeding. -Continue to follow up with Dr. Ouida Sills for left leg.  -Discussed daily foot inspection. If there are any changes, to call the office immediately.  -Follow-up in 3 months or sooner if any problems are to arise. In the meantime, encouraged to call the office with any questions, concerns, changes symptoms.  Celesta Gentile, DPM

## 2017-01-17 DIAGNOSIS — L080 Pyoderma: Secondary | ICD-10-CM | POA: Diagnosis not present

## 2017-01-22 DIAGNOSIS — Z0000 Encounter for general adult medical examination without abnormal findings: Secondary | ICD-10-CM | POA: Diagnosis not present

## 2017-01-22 DIAGNOSIS — R8299 Other abnormal findings in urine: Secondary | ICD-10-CM | POA: Diagnosis not present

## 2017-01-24 ENCOUNTER — Encounter: Payer: Medicare Other | Attending: Surgery | Admitting: Surgery

## 2017-01-24 DIAGNOSIS — H5440 Blindness, one eye, unspecified eye: Secondary | ICD-10-CM | POA: Insufficient documentation

## 2017-01-24 DIAGNOSIS — I890 Lymphedema, not elsewhere classified: Secondary | ICD-10-CM | POA: Diagnosis not present

## 2017-01-24 DIAGNOSIS — S81812A Laceration without foreign body, left lower leg, initial encounter: Secondary | ICD-10-CM | POA: Diagnosis not present

## 2017-01-24 DIAGNOSIS — L97222 Non-pressure chronic ulcer of left calf with fat layer exposed: Secondary | ICD-10-CM | POA: Diagnosis not present

## 2017-01-24 DIAGNOSIS — E43 Unspecified severe protein-calorie malnutrition: Secondary | ICD-10-CM | POA: Diagnosis not present

## 2017-01-24 DIAGNOSIS — G894 Chronic pain syndrome: Secondary | ICD-10-CM | POA: Diagnosis not present

## 2017-01-24 DIAGNOSIS — G2581 Restless legs syndrome: Secondary | ICD-10-CM | POA: Insufficient documentation

## 2017-01-24 DIAGNOSIS — X58XXXA Exposure to other specified factors, initial encounter: Secondary | ICD-10-CM | POA: Insufficient documentation

## 2017-01-24 DIAGNOSIS — Z8546 Personal history of malignant neoplasm of prostate: Secondary | ICD-10-CM | POA: Diagnosis not present

## 2017-01-24 DIAGNOSIS — L97821 Non-pressure chronic ulcer of other part of left lower leg limited to breakdown of skin: Secondary | ICD-10-CM | POA: Diagnosis not present

## 2017-01-24 DIAGNOSIS — M5136 Other intervertebral disc degeneration, lumbar region: Secondary | ICD-10-CM | POA: Insufficient documentation

## 2017-01-25 NOTE — Progress Notes (Signed)
Casey Gates ([id number]) Visit Report for 01/24/2017 Allergy List Details Patient Name: Casey Gates Date of Service: 01/24/2017 10:30 AM Medical Record Number: [id number] Patient Account Number: [id number] Date of Birth/Sex: 01-27-1926 (83 y.o. Male) Treating RN: Casey Gates Primary Care Casey Gates: Casey Gates Other Clinician: Referring Casey Gates: Casey Gates Treating Casey Gates/Extender: Casey Gates in Treatment: 0 Allergies Active Allergies No Known Drug Allergies Allergy Notes Electronic Signature(s) Signed: 01/24/2017 4:08:37 PM By: Casey Gates Entered By: Casey Cool, RN, Gates, Kim on 01/24/2017 10:57:38 Casey Gates ([id number]) -------------------------------------------------------------------------------- Arrival Information Details Patient Name: Casey Gates Date of Service: 01/24/2017 10:30 AM Medical Record Number: [id number] Patient Account Number: [id number] Date of Birth/Sex: 01/31/1926 (83 y.o. Male) Treating RN: Casey Gates Primary Care Laiylah Roettger: Casey Gates Other Clinician: Referring Sharilyn Geisinger: Casey Gates Treating Nyaisha Simao/Extender: Casey Gates in Treatment: 0 Visit Information Patient Arrived: Casey Gates Time: 10:38 Accompanied By: son, Casey Gates Transfer Assistance: None Patient Identification Verified: Yes Secondary Verification Process Yes Completed: Patient Has Alerts: Yes Patient Alerts: NOT Diabetic Electronic Signature(s) Signed: 01/24/2017 4:08:37 PM By: Casey Gates Entered By: Casey Cool, RN, Gates, Kim on 01/24/2017 10:39:23 Casey Gates ([id number]) -------------------------------------------------------------------------------- Clinic Level of Care Assessment Details Patient Name: Casey Gates Date of Service: 01/24/2017 10:30 AM Medical Record Number: [id number] Patient Account Number: [id number] Date of Birth/Sex: [birth year] (83 y.o. Male) Treating RN: Casey Gates Primary Care Serra Younan:  Casey Gates Other Clinician: Referring Taelyr Jantz: Casey Gates Treating Marjoria Mancillas/Extender: Casey Gates in Treatment: 0 Clinic Level of Care Assessment Items TOOL 1 Quantity Score []  - Use when EandM and Procedure is performed on INITIAL visit 0 ASSESSMENTS - Nursing Assessment / Reassessment X - General Physical Exam (combine w/ comprehensive assessment (listed just 1 20 below) when performed on new pt. evals) X - Comprehensive Assessment (HX, ROS, Risk Assessments, Wounds Hx, etc.) 1 25 ASSESSMENTS - Wound and Skin Assessment / Reassessment []  - Dermatologic / Skin Assessment (not related to wound area) 0 ASSESSMENTS - Ostomy and/or Continence Assessment and Care []  - Incontinence Assessment and Management 0 []  - Ostomy Care Assessment and Management (repouching, etc.) 0 PROCESS - Coordination of Care X - Simple Patient / Family Education for ongoing care 1 15 []  - Complex (extensive) Patient / Family Education for ongoing care 0 X - Staff obtains Programmer, systems, Records, Test Results / Process Orders 1 10 []  - Staff telephones HHA, Nursing Homes / Clarify orders / etc 0 []  - Routine Transfer to another Facility (non-emergent condition) 0 []  - Routine Hospital Admission (non-emergent condition) 0 X - New Admissions / Biomedical engineer / Ordering NPWT, Apligraf, etc. 1 15 []  - Emergency Hospital Admission (emergent condition) 0 PROCESS - Special Needs []  - Pediatric / Minor Patient Management 0 []  - Isolation Patient Management 0 Casey Gates ([id number]) []  - Hearing / Language / Visual special needs 0 []  - Assessment of Community assistance (transportation, D/C planning, etc.) 0 []  - Additional assistance / Altered mentation 0 []  - Support Surface(s) Assessment (bed, cushion, seat, etc.) 0 INTERVENTIONS - Miscellaneous []  - External ear exam 0 []  - Patient Transfer (multiple staff / Civil Service fast streamer / Similar devices) 0 []  - Simple Staple / Suture removal (25 or less)  0 []  - Complex Staple / Suture removal (26 or more) 0 []  - Hypo/Hyperglycemic Management (do not check if billed separately) 0 X - Ankle / Brachial Index (ABI) - do not check if billed separately 1 15 Has the patient been seen at the hospital within  the last three years: Yes Total Score: 100 Level Of Care: New/Established - Level 3 Electronic Signature(s) Signed: 01/24/2017 4:08:37 PM By: Casey Gates Entered By: Casey Cool, RN, Gates, Kim on 01/24/2017 12:49:57 Casey Gates ([id number]) -------------------------------------------------------------------------------- Encounter Discharge Information Details Patient Name: Casey Gates Date of Service: 01/24/2017 10:30 AM Medical Record Number: [id number] Patient Account Number: [id number] Date of Birth/Sex: 1926-05-18 (83 y.o. Male) Treating RN: Casey Gates Primary Care Gyasi Hazzard: Casey Gates Other Clinician: Referring Skylor Schnapp: Casey Gates Treating Kenlea Woodell/Extender: Casey Gates in Treatment: 0 Encounter Discharge Information Items Discharge Pain Level: 0 Discharge Condition: Stable Ambulatory Status: Walker Discharge Destination: Home Transportation: Private Auto Accompanied By: self Schedule Follow-up Appointment: Yes Medication Reconciliation completed Yes and provided to Patient/Care Sameera Betton: Provided on Clinical Summary of Care: 01/24/2017 Form Type Recipient Paper Patient MR Electronic Signature(s) Signed: 01/24/2017 1:01:25 PM By: Casey Gates Entered By: Casey Gates on 01/24/2017 13:01:25 Casey Gates ([id number]) -------------------------------------------------------------------------------- Lower Extremity Assessment Details Patient Name: Casey Gates Date of Service: 01/24/2017 10:30 AM Medical Record Number: [id number] Patient Account Number: [id number] Date of Birth/Sex: 1926-09-21 (83 y.o. Male) Treating RN: Casey Gates Primary Care Jaden Abreu: Casey Gates Other  Clinician: Referring Juliet Vasbinder: Casey Gates Treating Jionni Helming/Extender: Casey Gates in Treatment: 0 Edema Assessment Assessed: [Left: No] [Right: No] Edema: [Left: N] [Right: o] Vascular Assessment Pulses: Dorsalis Pedis Palpable: [Left:Yes] Posterior Tibial Palpable: [Left:Yes] Extremity colors, hair growth, and conditions: Extremity Color: [Left:Mottled] Hair Growth on Extremity: [Left:No] Temperature of Extremity: [Left:Warm] Capillary Refill: [Left:< 3 seconds] Dependent Rubor: [Left:No] Blanched when Elevated: [Left:No] Lipodermatosclerosis: [Left:No] Blood Pressure: Brachial: [Left:128] Dorsalis Pedis: 160 [Left:Dorsalis Pedis:] Ankle: Posterior Tibial: 156 [Left:Posterior Tibial: 1.25] Toe Nail Assessment Left: Right: Thick: Yes Discolored: Yes Deformed: No Improper Length and Hygiene: No Electronic Signature(s) Signed: 01/24/2017 4:08:37 PM By: Casey Gates Entered By: Casey Cool, RN, Gates, Kim on 01/24/2017 11:16:57 [place], [place] ([id number]) [place], [place] ([id number]) -------------------------------------------------------------------------------- Multi Wound Chart Details Patient Name: Casey Gates Date of Service: 01/24/2017 10:30 AM Medical Record Number: [id number] Patient Account Number: [id number] Date of Birth/Sex: 01-06-1926 (83 y.o. Male) Treating RN: Casey Gates Primary Care Jerni Selmer: Casey Gates Other Clinician: Referring Malyia Moro: Casey Gates Treating Aylin Rhoads/Extender: Casey Gates in Treatment: 0 Vital Signs Height(in): 73 Pulse(bpm): 63 Weight(lbs): 151 Blood Pressure 118/53 (mmHg): Body Mass Index(BMI): 20 Temperature(F): 97.8 Respiratory Rate 16 (breaths/min): Photos: [1:No Photos] [2:No Photos] [N/A:N/A] Wound Location: [1:Left Lower Leg - Anterior] [2:Left Lower Leg - Posterior] [N/A:N/A] Wounding Event: [1:Trauma] [2:Trauma] [N/A:N/A] Primary Etiology: [1:Trauma, Other] [2:Trauma, Other]  [N/A:N/A] Date Acquired: [1:01/10/2017] [2:01/21/2017] [N/A:N/A] Weeks of Treatment: [1:0] [2:0] [N/A:N/A] Wound Status: [1:Open] [2:Open] [N/A:N/A] Clustered Wound: [1:Yes] [2:No] [N/A:N/A] Clustered Quantity: [1:2] [2:N/A] [N/A:N/A] Measurements L x W x D 4.5x1.1x0.1 [2:7x0.8x0.4] [N/A:N/A] (cm) Area (cm) : [1:3.888] [id number] [N/A:N/A] Volume (cm) : [1:0.389] [2:1.759] [N/A:N/A] % Reduction in Area: [1:0.00%] [2:N/A] [N/A:N/A] % Reduction in Volume: 0.00% [2:N/A] [N/A:N/A] Classification: [1:Partial Thickness] [2:Full Thickness Without Exposed Support Structures] [N/A:N/A] Exudate Amount: [1:Medium] [2:Medium] [N/A:N/A] Exudate Type: [1:Serous] [2:Serous] [N/A:N/A] Exudate Color: [1:amber] [2:amber] [N/A:N/A] Wound Margin: [1:Flat and Intact] [2:Flat and Intact] [N/A:N/A] Granulation Amount: [1:Medium (34-66%)] [2:Small (1-33%)] [N/A:N/A] Granulation Quality: [1:Red, Pink] [2:Red] [N/A:N/A] Necrotic Amount: [1:Small (1-33%)] [2:Large (67-100%)] [N/A:N/A] Exposed Structures: [1:Fascia: No Fat Layer (Subcutaneous Tissue) Exposed: Yes Tissue) Exposed: No Tendon: No Muscle: No] [2:Fat Layer (Subcutaneous Fascia: No Tendon: No Muscle: No] [N/A:N/A] Joint: No Joint: No Bone: No Bone: No Limited to Skin Breakdown Epithelialization: None None N/A Debridement: N/A Debridement [id number]  N/A [id number]) Pre-procedure N/A 11:30 N/A Verification/Time Out Taken: Pain Control: N/A Other N/A Tissue Debrided: N/A Fibrin/Slough, Exudates, N/A Subcutaneous Debridement Area (sq N/A 5.6 N/A cm): Instrument: N/A Curette N/A Bleeding: N/A Moderate N/A Hemostasis Achieved: N/A Pressure N/A Procedural Pain: N/A 0 N/A Post Procedural Pain: N/A 0 N/A Debridement Treatment N/A Procedure was tolerated N/A Response: well Post Debridement N/A 7x0.8x0.5 N/A Measurements L x W x D (cm) Post Debridement N/A 2.199 N/A Volume: (cm) Periwound Skin Texture: No Abnormalities Noted Excoriation: Yes  N/A Induration: Yes Callus: No Crepitus: No Rash: No Scarring: No Periwound Skin No Abnormalities Noted Maceration: No N/A Moisture: Dry/Scaly: No Periwound Skin Color: No Abnormalities Noted Atrophie Blanche: No N/A Cyanosis: No Ecchymosis: No Erythema: No Hemosiderin Staining: No Mottled: No Pallor: No Rubor: No Temperature: N/A No Abnormality N/A Tenderness on No Yes N/A Palpation: Wound Preparation: Ulcer Cleansing: Ulcer Cleansing: N/A Rinsed/Irrigated with Rinsed/Irrigated with Saline Saline Borquez, Trajan ([id number]) Topical Anesthetic Topical Anesthetic Applied: None Applied: Other: lidocaine 4% Procedures Performed: N/A Debridement N/A Treatment Notes Electronic Signature(s) Signed: 01/24/2017 12:42:50 PM By: Christin Fudge MD, FACS Entered By: Christin Fudge on 01/24/2017 12:42:50 [place], [place] ([id number]) -------------------------------------------------------------------------------- [place] Details Patient Name: Casey Gates Date of Service: 01/24/2017 10:30 AM Medical Record Number: [id number] Patient Account Number: [id number] Date of Birth/Sex: 11-19-1926 (83 y.o. Male) Treating RN: Casey Gates Primary Care Jerran Tappan: Casey Gates Other Clinician: Referring Brigit Doke: Casey Gates Treating Loreen Bankson/Extender: Casey Gates in Treatment: 0 Active Inactive ` Abuse / Safety / Falls / Self Care Management Nursing Diagnoses: Impaired physical mobility Potential for falls Goals: Patient will remain injury free Date Initiated: 01/24/2017 Target Resolution Date: 02/24/2017 Goal Status: Active Interventions: Assess fall risk on admission and as needed Treatment Activities: Patient referred to home care : 01/24/2017 Notes: ` Orientation to the Wound Care Program Nursing Diagnoses: Knowledge deficit related to the wound healing center program Goals: Patient/caregiver will verbalize understanding of the [facility] Date Initiated: 01/24/2017 Target Resolution Date: 02/24/2017 Goal Status: Active Interventions: Provide education on orientation to the wound center Notes: ` Soft Tissue Infection Casey, Kvion ([id number]) Nursing Diagnoses: Impaired tissue integrity Potential for infection: soft tissue Goals: Patient will remain free of wound infection Date Initiated: 01/24/2017 Target Resolution Date: 02/24/2017 Goal Status: Active Interventions: Assess signs and symptoms of infection every visit Treatment Activities: Systemic antibiotics : 01/24/2017 Notes: ` Wound/Skin Impairment Nursing Diagnoses: Impaired tissue integrity Goals: Ulcer/skin breakdown will have a volume reduction of 30% by week 4 Date Initiated: 01/24/2017 Target Resolution Date: 02/14/2017 Goal Status: Active Interventions: Assess patient/caregiver ability to obtain necessary supplies Treatment Activities: Patient referred to home care : 01/24/2017 Notes: Electronic Signature(s) Signed: 01/24/2017 4:08:37 PM By: Casey Gates Entered By: Casey Cool, RN, Gates, Kim on 01/24/2017 11:33:50 [place], [place] ([id number]) -------------------------------------------------------------------------------- Pain Assessment Details Patient Name: Casey Gates Date of Service: 01/24/2017 10:30 AM Medical Record Number: [id number] Patient Account Number: [id number] Date of Birth/Sex: June 15, 1926 (83 y.o. Male) Treating RN: Casey Gates Primary Care Roya Gieselman: Casey Gates Other Clinician: Referring Amia Rynders: Casey Gates Treating Kiyah Demartini/Extender: Casey Gates in Treatment: 0 Active Problems Location of Pain Severity and Description of Pain Patient Has Paino No Site Locations With Dressing Change: No Rate the pain. Current Pain Level: 3 Character of Pain Describe the Pain: Aching, Tender Pain Management and Medication Current Pain Management: Goals for Pain Management Topical or injectable  lidocaine is offered to patient for acute pain when surgical debridement is performed. If  needed, Patient is instructed to use over the counter pain medication for the following 24-48 hours after debridement. Wound care MDs do not prescribed pain medications. Patient has chronic pain or uncontrolled pain. Patient has been instructed to make an appointment with their Primary Care Physician for pain management. Electronic Signature(s) Signed: 01/24/2017 4:08:37 PM By: Casey Gates Entered By: Casey Cool, RN, Gates, Kim on 01/24/2017 10:40:15 [place], Casey Gates ([id number]) -------------------------------------------------------------------------------- Patient/Caregiver Education Details Patient Name: Casey Gates Date of Service: 01/24/2017 10:30 AM Medical Record Number: [id number] Patient Account Number: [id number] Date of Birth/Gender: [birth year] (83 y.o. Male) Treating RN: Casey Gates Primary Care Physician: Casey Gates Other Clinician: Referring Physician: Velna Gates Treating Physician/Extender: Casey Gates in Treatment: 0 Education Assessment Education Provided To: Patient Education Topics Provided Welcome To [facility]: Handouts: [facility] To [facility] Methods: Demonstration, Explain/Verbal Responses: State content correctly Wound/Skin Impairment: Handouts: Caring for Your Ulcer Methods: Demonstration, Explain/Verbal Responses: State content correctly Electronic Signature(s) Signed: 01/24/2017 4:08:37 PM By: Casey Gates Entered By: Casey Cool, RN, Gates, Kim on 01/24/2017 12:55:06 [place], [place] ([id number]) -------------------------------------------------------------------------------- Wound Assessment Details Patient Name: Casey Gates Date of Service: 01/24/2017 10:30 AM Medical Record Number: [id number] Patient Account Number: [id number] Date of Birth/Sex: 1926/08/04 (83 y.o. Male) Treating RN: Casey Gates Primary Care  Eduard Penkala: Casey Gates Other Clinician: Referring Hayden Mabin: Casey Gates Treating Jacquiline Zurcher/Extender: Casey Gates in Treatment: 0 Wound Status Wound Number: 1 Primary Etiology: Trauma, Other Wound Location: Left Lower Leg - Anterior Wound Status: Open Wounding Event: Trauma Date Acquired: 01/10/2017 Weeks Of Treatment: 0 Clustered Wound: Yes Wound Measurements Length: (cm) 4.5 Width: (cm) 1.1 Depth: (cm) 0.1 Clustered Quantity: 2 Area: (cm) 3.888 Volume: (cm) 0.389 % Reduction in Area: 0% % Reduction in Volume: 0% Epithelialization: None Tunneling: No Undermining: No Wound Description Classification: Partial Thickness Wound Margin: Flat and Intact Exudate Amount: Medium Exudate Type: Serous Exudate Color: amber Foul Odor After Cleansing: No Slough/Fibrino No Wound Bed Granulation Amount: Medium (34-66%) Exposed Structure Granulation Quality: Red, Pink Fascia Exposed: No Necrotic Amount: Small (1-33%) Fat Layer (Subcutaneous Tissue) Exposed: No Necrotic Quality: Adherent Slough Tendon Exposed: No Muscle Exposed: No Joint Exposed: No Bone Exposed: No Limited to Skin Breakdown Periwound Skin Texture Texture Color No Abnormalities Noted: No No Abnormalities Noted: No Moisture No Abnormalities Noted: No [place], [place] ([id number]) Wound Preparation Ulcer Cleansing: Rinsed/Irrigated with Saline Topical Anesthetic Applied: None Treatment Notes Wound #1 (Left, Anterior Lower Leg) 1. Cleansed with: Clean wound with Normal Saline 2. Anesthetic Topical Lidocaine 4% cream to wound bed prior to debridement 4. Dressing Applied: Prisma Ag 5. Secondary Dressing Applied ABD and Kerlix/Conform 7. Secured with Recruitment consultant) Signed: 01/24/2017 4:08:37 PM By: Casey Gates Entered By: Casey Cool, RN, Gates, Kim on 01/24/2017 10:51:56 [place], [place]  ([id number]) -------------------------------------------------------------------------------- Wound Assessment Details Patient Name: Casey Gates Date of Service: 01/24/2017 10:30 AM Medical Record Number: [id number] Patient Account Number: [id number] Date of Birth/Sex: Jul 17, 1926 (83 y.o. Male) Treating RN: Casey Gates Primary Care Bawi Lakins: Casey Gates Other Clinician: Referring Carnella Fryman: Casey Gates Treating Jennefer Kopp/Extender: Casey Gates in Treatment: 0 Wound Status Wound Number: 2 Primary Etiology: Trauma, Other Wound Location: Left Lower Leg - Posterior Wound Status: Open Wounding Event: Trauma Date Acquired: 01/21/2017 Weeks Of Treatment: 0 Clustered Wound: No Wound Measurements Length: (cm) 7 Width: (cm) 0.8 Depth: (cm) 0.4 Area: (cm) 4.398 Volume: (cm) 1.759 % Reduction in Area: % Reduction in Volume: Epithelialization: None Tunneling:  No Undermining: No Wound Description Full Thickness Without Exposed Classification: Support Structures Wound Margin: Flat and Intact Exudate Medium Amount: Exudate Type: Serous Exudate Color: amber Foul Odor After Cleansing: No Slough/Fibrino Yes Wound Bed Granulation Amount: Small (1-33%) Exposed Structure Granulation Quality: Red Fascia Exposed: No Necrotic Amount: Large (67-100%) Fat Layer (Subcutaneous Tissue) Exposed: Yes Necrotic Quality: Adherent Slough Tendon Exposed: No Muscle Exposed: No Joint Exposed: No Bone Exposed: No Periwound Skin Texture Texture Color No Abnormalities Noted: No No Abnormalities Noted: No Callus: No Atrophie Blanche: No Crepitus: No Cyanosis: No Excoriation: Yes Ecchymosis: No Rains, Reuben ([id number]) Induration: Yes Erythema: No Rash: No Hemosiderin Staining: No Scarring: No Mottled: No Pallor: No Moisture Rubor: No No Abnormalities Noted: No Dry / Scaly: No Temperature / Pain Maceration: No Temperature: No Abnormality Tenderness on Palpation:  Yes Wound Preparation Ulcer Cleansing: Rinsed/Irrigated with Saline Topical Anesthetic Applied: Other: lidocaine 4%, Treatment Notes Wound #2 (Left, Posterior Lower Leg) 1. Cleansed with: Clean wound with Normal Saline 2. Anesthetic Topical Lidocaine 4% cream to wound bed prior to debridement 4. Dressing Applied: Prisma Ag 5. Secondary Dressing Applied ABD and Kerlix/Conform 7. Secured with Recruitment consultant) Signed: 01/24/2017 4:08:37 PM By: Casey Gates Entered By: Casey Cool, RN, Gates, Kim on 01/24/2017 10:54:56 [place], [place] ([id number]) -------------------------------------------------------------------------------- [place] Details Patient Name: Casey Gates Date of Service: 01/24/2017 10:30 AM Medical Record Number: [id number] Patient Account Number: [id number] Date of Birth/Sex: 04/08/1926 (83 y.o. Male) Treating RN: Casey Gates Primary Care Orman Matsumura: Casey Gates Other Clinician: Referring Vansh Reckart: Casey Gates Treating Sache Sane/Extender: Casey Gates in Treatment: 0 Vital Signs Time Taken: 10:40 Temperature (F): 97.8 Height (in): 73 Pulse (bpm): 63 Weight (lbs): 151 Respiratory Rate (breaths/min): 16 Body Mass Index (BMI): 19.9 Blood Pressure (mmHg): 118/53 Reference Range: 80 - 120 mg / dl Electronic Signature(s) Signed: 01/24/2017 4:08:37 PM By: Casey Gates Entered By: Casey Cool, RN, Gates, Kim on 01/24/2017 10:41:06

## 2017-01-25 NOTE — Progress Notes (Signed)
Casey Gates, Casey Gates ([id number]) Visit Report for 01/24/2017 Abuse/Suicide Risk Screen Details Patient Name: Casey Gates, Casey Gates Date of Service: 01/24/2017 10:30 AM Medical Record Number: [id number] Patient Account Number: [id number] Date of Birth/Sex: 02/29/1926 (83 y.o. Male) Treating RN: Cornell Barman Primary Care Gabryela Kimbrell: Velna Hatchet Other Clinician: Referring Felipe Paluch: Velna Hatchet Treating Reilynn Lauro/Extender: Frann Rider in Treatment: 0 Abuse/Suicide Risk Screen Items Answer ABUSE/SUICIDE RISK SCREEN: Has anyone close to you tried to hurt or harm you recentlyo No Do you feel uncomfortable with anyone in your familyo No Has anyone forced you do things that you didnot want to doo No Do you have any thoughts of harming yourselfo No Patient displays signs or symptoms of abuse and/or neglect. No Electronic Signature(s) Signed: 01/24/2017 4:08:37 PM By: Gretta Cool, RN, BSN, Kim RN, BSN Entered By: Gretta Cool, RN, BSN, Kim on 01/24/2017 11:08:06 [place], [name] ([id number]) -------------------------------------------------------------------------------- Activities of Daily Living Details Patient Name: Casey Gates Date of Service: 01/24/2017 10:30 AM Medical Record Number: [id number] Patient Account Number: [id number] Date of Birth/Sex: September 09, 1926 (83 y.o. Male) Treating RN: Cornell Barman Primary Care Coulson Wehner: Velna Hatchet Other Clinician: Referring Sofia Jaquith: Velna Hatchet Treating Johntavius Shepard/Extender: Frann Rider in Treatment: 0 Activities of Daily Living Items Answer Activities of Daily Living (Please select one for each item) Drive Automobile Not Able Take Medications Completely Able Use Telephone Completely Able Care for Appearance Completely Able Use Toilet Completely Able Bath / Shower Completely Able Dress Self Completely Able Feed Self Completely Able Walk Completely Able Get In / Out Bed Completely [facility] for Self Completely Able Electronic Signature(s) Signed: 01/24/2017 4:08:37 PM By: Gretta Cool, RN, BSN, Kim RN, BSN Entered By: Gretta Cool, RN, BSN, Kim on 01/24/2017 11:08:36 [place], [place] ([id number]) -------------------------------------------------------------------------------- Education Assessment Details Patient Name: Casey Gates Date of Service: 01/24/2017 10:30 AM Medical Record Number: [id number] Patient Account Number: [id number] Date of Birth/Sex: 09/19/1926 (83 y.o. Male) Treating RN: Cornell Barman Primary Care Dwanda Tufano: Velna Hatchet Other Clinician: Referring Journiee Feldkamp: Velna Hatchet Treating Yashas Camilli/Extender: Frann Rider in Treatment: 0 Primary Learner Assessed: Patient Learning Preferences/Education Level/Primary Language Learning Preference: Explanation, Demonstration Highest Education Level: College or Above Preferred Language: English Cognitive Barrier Assessment/Beliefs Language Barrier: No Translator Needed: No Memory Deficit: No Emotional Barrier: No Cultural/Religious Beliefs Affecting Medical No Care: Physical Barrier Assessment Impaired Vision: Yes Legally Blind, left eye Impaired Hearing: No Decreased Hand dexterity: No Knowledge/Comprehension Assessment Knowledge Level: High Comprehension Level: High Ability to understand written High instructions: Ability to understand verbal High instructions: Motivation Assessment Anxiety Level: Calm Cooperation: Cooperative Education Importance: Acknowledges Need Interest in Health Problems: Asks Questions Perception: Coherent Willingness to Engage in Self- High Management Activities: Readiness to Engage in Self- High Management Activities: Electronic Signature(sJAFET, MCF[employer]ATERS ([id number]) Signed: 01/24/2017 4:08:37 PM By: Gretta Cool, RN, BSN, Kim RN, BSN Entered By: Gretta Cool, RN, BSN, Kim on 01/24/2017 11:09:08 [place], [place]  ([id number]) -------------------------------------------------------------------------------- Fall Risk Assessment Details Patient Name: Casey Gates Date of Service: 01/24/2017 10:30 AM Medical Record Number: [id number] Patient Account Number: [id number] Date of Birth/Sex: 11-29-1926 (82 y.o. Male) Treating RN: Cornell Barman Primary Care Lofton Leon: Velna Hatchet Other Clinician: Referring Arihana Ambrocio: Velna Hatchet Treating Kaisa Wofford/Extender: Frann Rider in Treatment: 0 Fall Risk Assessment Items Have you had 2 or more falls in the last 12 monthso 0 Yes Have you had any fall that resulted in injury in the last 12 monthso 0 No FALL RISK ASSESSMENT: History of falling - immediate or within 3 months 0 No Secondary  diagnosis 0 No Ambulatory aid None/bed rest/wheelchair/nurse 0 No Crutches/cane/walker 15 Yes Furniture 0 No IV Access/Saline Lock 0 No Gait/Training Normal/bed rest/immobile 0 Yes Weak 10 Yes Impaired 0 No Mental Status Oriented to own ability 0 No Electronic Signature(s) Signed: 01/24/2017 4:08:37 PM By: Gretta Cool, RN, BSN, Kim RN, BSN Entered By: Gretta Cool, RN, BSN, Kim on 01/24/2017 11:09:35 [place], [place] ([id number]) -------------------------------------------------------------------------------- Foot Assessment Details Patient Name: Casey Gates Date of Service: 01/24/2017 10:30 AM Medical Record Number: [id number] Patient Account Number: [id number] Date of Birth/Sex: 1926-09-13 (83 y.o. Male) Treating RN: Cornell Barman Primary Care Marlene Pfluger: Velna Hatchet Other Clinician: Referring Isay Perleberg: Velna Hatchet Treating Tyanne Derocher/Extender: Frann Rider in Treatment: 0 Foot Assessment Items Site Locations + = Sensation present, - = Sensation absent, C = Callus, U = Ulcer R = Redness, W = Warmth, M = Maceration, PU = Pre-ulcerative lesion F = Fissure, S = Swelling, D = Dryness Assessment Right: Left: Other Deformity: No No Prior Foot Ulcer: No No Prior  Amputation: No No Charcot Joint: No No Ambulatory Status: Ambulatory With Help Assistance Device: Walker GaitEnergy manager) Signed: 01/24/2017 4:08:37 PM By: Gretta Cool, RN, BSN, Kim RN, BSN Entered By: Gretta Cool, RN, BSN, Kim on 01/24/2017 11:10:49 [place], [place] ([id number]) -------------------------------------------------------------------------------- Nutrition Risk Assessment Details Patient Name: Casey Gates Date of Service: 01/24/2017 10:30 AM Medical Record Number: [id number] Patient Account Number: [id number] Date of Birth/Sex: [birth year] (83 y.o. Male) Treating RN: Cornell Barman Primary Care Yides Saidi: Velna Hatchet Other Clinician: Referring Loralee Weitzman: Velna Hatchet Treating Kadarious Dikes/Extender: Frann Rider in Treatment: 0 Height (in): 73 Weight (lbs): 151 Body Mass Index (BMI): 19.9 Nutrition Risk Assessment Items NUTRITION RISK SCREEN: I have an illness or condition that made me change the kind and/or 0 No amount of food I eat I eat fewer than two meals per day 0 No I eat few fruits and vegetables, or milk products 0 No I have three or more drinks of beer, liquor or wine almost every day 0 No I have tooth or mouth problems that make it hard for me to eat 0 No I don't always have enough money to buy the food I need 0 No I eat alone most of the time 0 No I take three or more different prescribed or over-the-counter drugs a 0 No day Without wanting to, I have lost or gained 10 pounds in the last six 0 No months I am not always physically able to shop, cook and/or feed myself 0 No Nutrition Protocols Good Risk Protocol 0 No interventions needed Moderate Risk Protocol Electronic Signature(s) Signed: 01/24/2017 4:08:37 PM By: Gretta Cool, RN, BSN, Kim RN, BSN Entered By: Gretta Cool, RN, BSN, Kim on 01/24/2017 11:09:42

## 2017-01-25 NOTE — Progress Notes (Addendum)
ATUL, REICHMAN ([id number]) Visit Report for 01/24/2017 Chief Complaint Document Details Patient Name: Casey Gates, Casey Gates Date of Service: 01/24/2017 10:30 AM Medical Record Number: [id number] Patient Account Number: [id number] Date of Birth/Sex: 09-17-1926 (83 y.o. Male) Treating RN: Cornell Barman Primary Care Provider: Velna Hatchet Other Clinician: Referring Provider: Velna Hatchet Treating Provider/Extender: Frann Rider in Treatment: 0 Information Obtained from: Patient Chief Complaint Patient seen for complaints of Non-Healing Wound to the left lower extremity which she's had for about a month. Electronic Signature(s) Signed: 01/24/2017 12:43:21 PM By: Christin Fudge MD, FACS Entered By: Christin Fudge on 01/24/2017 12:43:21 Casey Gates, Casey Gates ([id number]) -------------------------------------------------------------------------------- Debridement Details Patient Name: Casey Gates Date of Service: 01/24/2017 10:30 AM Medical Record Number: [id number] Patient Account Number: [id number] Date of Birth/Sex: 1926-10-04 (83 y.o. Male) Treating RN: Cornell Barman Primary Care Provider: Velna Hatchet Other Clinician: Referring Provider: Velna Hatchet Treating Provider/Extender: Frann Rider in Treatment: 0 Debridement Performed for Wound #2 Left,Posterior Lower Leg Assessment: Performed By: Physician Christin Fudge, MD Debridement: Debridement Pre-procedure Yes - 11:30 Verification/Time Out Taken: Start Time: 11:31 Pain Control: Other : lidocaine 4% Level: Skin/Subcutaneous Tissue Total Area Debrided (L x 7 (cm) x 0.8 (cm) = 5.6 (cm) W): Tissue and other Viable, Non-Viable, Fibrin/Slough, Subcutaneous material debrided: Instrument: Curette Bleeding: Minimum Hemostasis Achieved: Pressure End Time: 11:35 Procedural Pain: 1 Post Procedural Pain: 0 Response to Treatment: Procedure was tolerated well Post Debridement Measurements of Total Wound Length: (cm) 7 Width:  (cm) 0.8 Depth: (cm) 0.5 Volume: (cm) 2.199 Character of Wound/Ulcer Post Requires Further Debridement Debridement: Severity of Tissue Post Debridement: Fat layer exposed Post Procedure Diagnosis Same as Pre-procedure Electronic Signature(s) Signed: 01/24/2017 4:08:37 PM By: Gretta Cool, RN, BSN, Kim RN, BSN Signed: 01/24/2017 4:26:35 PM By: Christin Fudge MD, FACS Entered By: Gretta Cool RN, BSN, Kim on 01/24/2017 12:52:17 Casey Gates, Casey Gates ([id number], Casey Gates ([id number]) -------------------------------------------------------------------------------- HPI Details Patient Name: Casey Gates Date of Service: 01/24/2017 10:30 AM Medical Record Number: [id number] Patient Account Number: [id number] Date of Birth/Sex: March 07, 1926 (83 y.o. Male) Treating RN: Cornell Barman Primary Care Provider: Velna Hatchet Other Clinician: Referring Provider: Velna Hatchet Treating Provider/Extender: Frann Rider in Treatment: 0 History of Present Illness Location: anterior and posterior lateral left lower extremity Quality: Patient reports experiencing a dull pain to affected area(s). Severity: Patient states wound (s) are getting better. Duration: Patient has had the wound for < 4 weeks prior to presenting for treatment Timing: Pain in wound is constant (hurts all the time) Context: The wound occurred when the patient his walker with his legs and had injuries to them Modifying Factors: Other treatment(s) tried include:as been seeing dermatology for management of his problems Associated Signs and Symptoms: Patient reports having increase swelling. HPI Description: A fairly healthy 83 year old gentleman whose chronic medical problems include lumbar degenerative disc disease, kyphoscoliosis, chronic pain syndrome, history of syncope, history of food impaction, prostate cancer treated via TURP in 1997, left eye blindness and stasis and edema in the lower extremities. he walks with the help of a walker  and on 2 separate occasions has injured his legs more on the left side with a left anterior and left posterior lateral injuries. Is been seen by dermatology for problems with stasis ulceration and lymphedema in the past and this time around has been treated by them with local care and a couple of courses of antibiotics which include doxycycline and Bactrim. Also been applying Bactroban ointment locally Electronic Signature(s) Signed: 01/24/2017 12:46:27 PM By: Christin Fudge MD, FACS Entered By: Christin Fudge  on 01/24/2017 12:46:27 Casey Gates, Casey Gates ([id number]) -------------------------------------------------------------------------------- Physical Exam Details Patient Name: Casey Gates, Casey Gates Date of Service: 01/24/2017 10:30 AM Medical Record Number: [id number] Patient Account Number: [id number] Date of Birth/Sex: 09/07/1926 (83 y.o. Male) Treating RN: Cornell Barman Primary Care Provider: Velna Hatchet Other Clinician: Referring Provider: Velna Hatchet Treating Provider/Extender: Frann Rider in Treatment: 0 Constitutional . Pulse regular. Respirations normal and unlabored. Afebrile. . Eyes Nonicteric. Reactive to light. Ears, Nose, Mouth, and Throat Lips, teeth, and gums WNL.Marland Kitchen Moist mucosa without lesions. Neck supple and nontender. No palpable supraclavicular or cervical adenopathy. Normal sized without goiter. Respiratory WNL. No retractions.. Cardiovascular Pedal Pulses WNL. stage I lymphedema bilaterally. Gastrointestinal (GI) Abdomen without masses or tenderness.. No liver or spleen enlargement or tenderness.. Lymphatic No adneopathy. No adenopathy. No adenopathy. Musculoskeletal Adexa without tenderness or enlargement.. Digits and nails w/o clubbing, cyanosis, infection, petechiae, ischemia, or inflammatory conditions.. Integumentary (Hair, Skin) No suspicious lesions. No crepitus or fluctuance. No peri-wound warmth or erythema. No masses.Marland Kitchen Psychiatric Judgement and  insight Intact.. No evidence of depression, anxiety, or agitation.. Notes he has a superficial ulceration on the left anterior leg which is up for lysing nicely. On the left posterior lateral leg he has a fairly deep ulceration with subcutaneous debris and after sharply debriding with a #3 curet minimal bleeding controlled with pressure Electronic Signature(s) Signed: 01/24/2017 12:47:11 PM By: Christin Fudge MD, FACS Entered By: Christin Fudge on 01/24/2017 12:47:10 [place], [place] ([id number]) -------------------------------------------------------------------------------- Physician Orders Details Patient Name: Casey Gates Date of Service: 01/24/2017 10:30 AM Medical Record Number: [id number] Patient Account Number: [id number] Date of Birth/Sex: January 01, 1926 (83 y.o. Male) Treating RN: Cornell Barman Primary Care Provider: Velna Hatchet Other Clinician: Referring Provider: Velna Hatchet Treating Provider/Extender: Frann Rider in Treatment: 0 Verbal / Phone Orders: No Diagnosis Coding Wound Cleansing Wound #1 Left,Anterior Lower Leg o Clean wound with Normal Saline. Wound #2 Left,Posterior Lower Leg o Clean wound with Normal Saline. Anesthetic Wound #1 Left,Anterior Lower Leg o Topical Lidocaine 4% cream applied to wound Gates prior to debridement - in clinic only Wound #2 Left,Posterior Lower Leg o Topical Lidocaine 4% cream applied to wound Gates prior to debridement - in clinic only Primary Wound Dressing Wound #1 Left,Anterior Lower Leg o Prisma Ag Wound #2 Left,Posterior Lower Leg o Prisma Ag Secondary Dressing Wound #1 Left,Anterior Lower Leg o ABD and Kerlix/Conform Wound #2 Left,Posterior Lower Leg o ABD and Kerlix/Conform Dressing Change Frequency Wound #1 Left,Anterior Lower Leg o Change Dressing Monday, Wednesday, Friday Wound #2 Left,Posterior Lower Leg o Change Dressing Monday, Wednesday, Friday Follow-up Appointments [place], Stra[place]  ([id number]) Wound #1 Left,Anterior Lower Leg o Return Appointment in 1 week. Wound #2 Left,Posterior Lower Leg o Return Appointment in 1 week. Home Health Wound #1 [place] for [place] Nurse may visit PRN to address patientos wound care needs. o FACE TO FACE ENCOUNTER: MEDICARE and MEDICAID PATIENTS: I certify that this patient is under my care and that I had a face-to-face encounter that meets the physician face-to-face encounter requirements with this patient on this date. The encounter with the patient was in whole or in part for the following MEDICAL CONDITION: (primary reason for [place]) MEDICAL NECESSITY: I certify, that based on my findings, NURSING services are a medically necessary home health service. HOME BOUND STATUS: I certify that my clinical findings support that this patient is homebound (i.e., Due to illness or injury, pt requires aid of supportive devices such as crutches,  cane, wheelchairs, walkers, the use of special transportation or the assistance of another person to leave their place of residence. There is a normal inability to leave the home and doing so requires considerable and taxing effort. Other absences are for medical reasons / religious services and are infrequent or of short duration when for other reasons). o If current dressing causes regression in wound condition, may D/C ordered dressing product/s and apply Normal Saline Moist Dressing daily until next [place] / Other MD appointment. [place] of regression in wound condition at [phone]. o Please direct any NON-WOUND related issues/requests for orders to patient's Primary Care Physician Wound #2 [place] for [place] Nurse may visit PRN to address patientos wound care needs. o FACE TO FACE ENCOUNTER: MEDICARE and  MEDICAID PATIENTS: I certify that this patient is under my care and that I had a face-to-face encounter that meets the physician face-to-face encounter requirements with this patient on this date. The encounter with the patient was in whole or in part for the following MEDICAL CONDITION: (primary reason for [place]) MEDICAL NECESSITY: I certify, that based on my findings, NURSING services are a medically necessary home health service. HOME BOUND STATUS: I certify that my clinical findings support that this patient is homebound (i.e., Due to illness or injury, pt requires aid of supportive devices such as crutches, cane, wheelchairs, walkers, the use of special transportation or the assistance of another person to leave their place of residence. There is a normal inability to leave the home and doing so requires considerable and taxing effort. Other absences are for medical reasons / religious services and are infrequent or of short duration when for other reasons). o If current dressing causes regression in wound condition, may D/C ordered dressing product/s and apply Normal Saline Moist Dressing daily until next [place] / Other MD appointment. [place] of regression in wound condition at [phone]. o Please direct any NON-WOUND related issues/requests for orders to patient's Primary Care Physician Casey Gates, Casey Gates ([id number]) Electronic Signature(s) Signed: 01/24/2017 4:08:37 PM By: Gretta Cool RN, BSN, Kim RN, BSN Signed: 01/24/2017 4:26:35 PM By: Christin Fudge MD, FACS Entered By: Gretta Cool RN, BSN, Kim on 01/24/2017 12:54:14 [place], [place] ([id number]) -------------------------------------------------------------------------------- Problem List Details Patient Name: Casey Gates Date of Service: 01/24/2017 10:30 AM Medical Record Number: [id number] Patient Account Number: [id number] Date of Birth/Sex: 01-14-1926 (82 y.o. Male) Treating RN: Cornell Barman Primary Care Provider: Velna Hatchet Other Clinician: Referring Provider: Velna Hatchet Treating Provider/Extender: Frann Rider in Treatment: 0 Active Problems ICD-10 Encounter Code Description Active Date Diagnosis S81.812A Laceration without foreign body, left lower leg, initial 01/24/2017 Yes encounter I89.0 Lymphedema, not elsewhere classified 01/24/2017 Yes L97.222 Non-pressure chronic ulcer of left calf with fat layer 01/24/2017 Yes exposed Inactive Problems Resolved Problems Electronic Signature(s) Signed: 01/24/2017 12:42:43 PM By: Christin Fudge MD, FACS Entered By: Christin Fudge on 01/24/2017 12:42:43 [place], [place] ([id number]) -------------------------------------------------------------------------------- Progress Note Details Patient Name: Casey Gates Date of Service: 01/24/2017 10:30 AM Medical Record Number: [id number] Patient Account Number: [id number] Date of Birth/Sex: 05-27-1926 (83 y.o. Male) Treating RN: Cornell Barman Primary Care Provider: Velna Hatchet Other Clinician: Referring Provider: Velna Hatchet Treating Provider/Extender: Frann Rider in Treatment: 0 Subjective Chief Complaint Information obtained from Patient Patient seen for complaints of Non-Healing Wound to the left lower extremity which she's had for about a month. History of Present Illness (HPI) The following HPI elements  were documented for the patient's wound: Location: anterior and posterior lateral left lower extremity Quality: Patient reports experiencing a dull pain to affected area(s). Severity: Patient states wound (s) are getting better. Duration: Patient has had the wound for < 4 weeks prior to presenting for treatment Timing: Pain in wound is constant (hurts all the time) Context: The wound occurred when the patient his walker with his legs and had injuries to them Modifying Factors: Other treatment(s) tried include:as been seeing dermatology for  management of his problems Associated Signs and Symptoms: Patient reports having increase swelling. A fairly healthy 83 year old gentleman whose chronic medical problems include lumbar degenerative disc disease, kyphoscoliosis, chronic pain syndrome, history of syncope, history of food impaction, prostate cancer treated via TURP in 1997, left eye blindness and stasis and edema in the lower extremities. he walks with the help of a walker and on 2 separate occasions has injured his legs more on the left side with a left anterior and left posterior lateral injuries. Is been seen by dermatology for problems with stasis ulceration and lymphedema in the past and this time around has been treated by them with local care and a couple of courses of antibiotics which include doxycycline and Bactrim. Also been applying Bactroban ointment locally Wound History Patient presents with 2 open wounds that have been present for approximately 10 days and 3 weeks. Patient has been treating wounds in the following manner: mipirocen cream and ABX. Laboratory tests have been performed in the last month. Patient reportedly has not tested positive for an antibiotic resistant organism. Patient reportedly has not tested positive for osteomyelitis. Patient reportedly has not had testing performed to evaluate circulation in the legs. Patient History Information obtained from Patient. Allergies [place], Elena ([id number]) No Known Drug Allergies Family History Cancer - Mother, Hypertension - Mother, No family history of Diabetes, Heart Disease, Kidney Disease, Lung Disease, Seizures, Stroke, Thyroid Problems, Tuberculosis. Social History Never smoker, Marital Status - Widowed, Alcohol Use - Never, Drug Use - No History, Caffeine Use - Moderate. Medical History Eyes Denies history of Cataracts, Glaucoma, Optic Neuritis Ear/Nose/Mouth/Throat Denies history of Chronic sinus problems/congestion, Middle ear  problems Hematologic/Lymphatic Denies history of Anemia, Hemophilia, Human Immunodeficiency Virus, Lymphedema, Sickle Cell Disease Respiratory Denies history of Aspiration, Asthma, Chronic Obstructive Pulmonary Disease (COPD), Pneumothorax, Sleep Apnea, Tuberculosis Cardiovascular Denies history of Angina, Arrhythmia, Congestive Heart Failure, Coronary Artery Disease, Deep Vein Thrombosis, Hypertension, Hypotension, Myocardial Infarction, Peripheral Arterial Disease, Peripheral Venous Disease, Phlebitis, Vasculitis Gastrointestinal Denies history of Cirrhosis , Colitis, Crohn s, Hepatitis A, Hepatitis B, Hepatitis C Endocrine Denies history of Type I Diabetes, Type II Diabetes Genitourinary Denies history of End Stage Renal Disease Immunological Denies history of Lupus Erythematosus, Raynaud s, Scleroderma Integumentary (Skin) Denies history of History of Burn, History of pressure wounds Musculoskeletal Denies history of Gout, Rheumatoid Arthritis, Osteoarthritis, Osteomyelitis Neurologic Denies history of Dementia, Neuropathy, Quadriplegia, Paraplegia, Seizure Disorder Oncologic Denies history of Received Chemotherapy, Received Radiation Psychiatric Denies history of Anorexia/bulimia, Confinement Anxiety Medical And Surgical History Notes Constitutional Symptoms (General Health) Restless Leg, Chronic Back Pain Eyes Blind in left eye Oncologic [facility], Jacquelyn ([id number]) Prostate Cancer 31 years ago Review of Systems (ROS) Constitutional Symptoms (General Health) The patient has no complaints or symptoms. Eyes The patient has no complaints or symptoms. Ear/Nose/Mouth/Throat The patient has no complaints or symptoms. Hematologic/Lymphatic The patient has no complaints or symptoms. Respiratory The patient has no complaints or symptoms. Cardiovascular The patient has no complaints or symptoms. Gastrointestinal The patient has  no complaints or symptoms. Endocrine Denies  complaints or symptoms of Hepatitis, Thyroid disease, Polydypsia (Excessive Thirst). Genitourinary The patient has no complaints or symptoms. Immunological The patient has no complaints or symptoms. Integumentary (Skin) Complains or has symptoms of Wounds. Denies complaints or symptoms of Bleeding or bruising tendency, Breakdown, Swelling. Musculoskeletal The patient has no complaints or symptoms. Neurologic The patient has no complaints or symptoms. Oncologic The patient has no complaints or symptoms. Psychiatric The patient has no complaints or symptoms. Medications dry eyes ointment sulfamethoxazole 800 mg-trimethoprim 160 mg tablet oral tablet oral hydromorphone ER 16 mg tablet,extended release 24 hr oral tablet extended release 24 hr oral gabapentin 300 mg capsule oral capsule oral mupirocin 2 % topical ointment topical ointment topical Casey Gates, [place] ([id number]) Objective Constitutional Pulse regular. Respirations normal and unlabored. Afebrile. Vitals Time Taken: 10:40 AM, Height: 73 in, Weight: 151 lbs, BMI: 19.9, Temperature: 97.8 F, Pulse: 63 bpm, Respiratory Rate: 16 breaths/min, Blood Pressure: 118/53 mmHg. Eyes Nonicteric. Reactive to light. Ears, Nose, Mouth, and Throat Lips, teeth, and gums WNL.Marland Kitchen Moist mucosa without lesions. Neck supple and nontender. No palpable supraclavicular or cervical adenopathy. Normal sized without goiter. Respiratory WNL. No retractions.. Cardiovascular Pedal Pulses WNL. stage I lymphedema bilaterally. Gastrointestinal (GI) Abdomen without masses or tenderness.. No liver or spleen enlargement or tenderness.. Lymphatic No adneopathy. No adenopathy. No adenopathy. Musculoskeletal Adexa without tenderness or enlargement.. Digits and nails w/o clubbing, cyanosis, infection, petechiae, ischemia, or inflammatory conditions.Marland Kitchen Psychiatric Judgement and insight Intact.. No evidence of depression, anxiety, or agitation.. General Notes:  he has a superficial ulceration on the left anterior leg which is up for lysing nicely. On the left posterior lateral leg he has a fairly deep ulceration with subcutaneous debris and after sharply debriding with a #3 curet minimal bleeding controlled with pressure Integumentary (Hair, Skin) No suspicious lesions. No crepitus or fluctuance. No peri-wound warmth or erythema. No masses.. Wound #1 status is Open. Original cause of wound was Trauma. The wound is located on the Left,Anterior Lower Leg. The wound measures 4.5cm length x 1.1cm width x 0.1cm depth; 3.888cm^2 area and 0.389cm^3 volume. The wound is limited to skin breakdown. There is no tunneling or undermining noted. There is a medium amount of serous drainage noted. The wound margin is flat and intact. There is medium Casey Gates, Casey Gates ([id number]) (34-66%) Gates, Casey Gates. There is a small (1-33%) amount of necrotic tissue within the wound Gates including Adherent Slough. Wound #2 status is Open. Original cause of wound was Trauma. The wound is located on the Left,Posterior Lower Leg. The wound measures 7cm length x 0.8cm width x 0.4cm depth; 4.398cm^2 area and 1.759cm^3 volume. There is Fat Layer (Subcutaneous Tissue) Exposed exposed. There is no tunneling or undermining noted. There is a medium amount of serous drainage noted. The wound margin is flat and intact. There is small (1-33%) Gates granulation within the wound Gates. There is a large (67-100%) amount of necrotic tissue within the wound Gates including Adherent Slough. The periwound skin appearance exhibited: Excoriation, Induration. The periwound skin appearance did not exhibit: Callus, Crepitus, Rash, Scarring, Dry/Scaly, Maceration, Atrophie Blanche, Cyanosis, Ecchymosis, Hemosiderin Staining, Mottled, Pallor, Rubor, Erythema. Periwound temperature was noted as No Abnormality. The periwound has tenderness on palpation. Assessment Active  Problems ICD-10 S81.812A - Laceration without foreign body, left lower leg, initial encounter I89.0 - Lymphedema, not elsewhere classified L97.222 - Non-pressure chronic ulcer of left calf with fat layer exposed This 83 year old gentleman who may have an element  of lymphedema and possible venous hypertension would like to continue with conservator therapy has much as possible and after review I have recommended: 1. Silver collagen to be applied and changed every other day. a light Kerlix bandage to hold this in place. 2. elevation and exercise as much possible 3. His PCP and dermatologists were prescribed him antibiotics 4. Seen back in the weeks' time at our office 5. If discussed adequate protein, vitamin A, vitamin C and zinc Procedures Wound #2 Wound #2 is a Trauma, Other located on the Left,Posterior Lower Leg . There was a Skin/Subcutaneous Tissue Debridement [id number]) debridement with total area of 5.6 sq cm performed by Christin Fudge, MD. with the following instrument(s): Curette to remove Viable and Non-Viable tissue/material including Fibrin/Slough and Subcutaneous after achieving pain control using Other (lidocaine 4%). A time out was conducted at 11:30, prior to the start of the procedure. A Minimum amount of bleeding was controlled with Pressure. The procedure was tolerated well with a pain level of 1 throughout and a pain level of 0 following Casey Gates, Casey Gates ([id number]) the procedure. Post Debridement Measurements: 7cm length x 0.8cm width x 0.5cm depth; 2.199cm^3 volume. Character of Wound/Ulcer Post Debridement requires further debridement. Severity of Tissue Post Debridement is: Fat layer exposed. Post procedure Diagnosis Wound #2: Same as Pre-Procedure Plan Wound Cleansing: Wound #1 Left,Anterior Lower Leg: Clean wound with Normal Saline. Wound #2 Left,Posterior Lower Leg: Clean wound with Normal Saline. Anesthetic: Wound #1 Left,Anterior Lower Leg: Topical  Lidocaine 4% cream applied to wound Gates prior to debridement - in clinic only Wound #2 Left,Posterior Lower Leg: Topical Lidocaine 4% cream applied to wound Gates prior to debridement - in clinic only Primary Wound Dressing: Wound #1 Left,Anterior Lower Leg: Prisma Ag Wound #2 Left,Posterior Lower Leg: Prisma Ag Secondary Dressing: Wound #1 Left,Anterior Lower Leg: ABD and Kerlix/Conform Wound #2 Left,Posterior Lower Leg: ABD and Kerlix/Conform Dressing Change Frequency: Wound #1 Left,Anterior Lower Leg: Change Dressing Monday, Wednesday, Friday Wound #2 Left,Posterior Lower Leg: Change Dressing Monday, Wednesday, Friday Follow-up Appointments: Wound #1 Left,Anterior Lower Leg: Return Appointment in 1 week. Wound #2 Left,Posterior Lower Leg: Return Appointment in 1 week. Home Health: Wound #1 Left,Anterior Lower Leg: [place] for [place] Nurse may visit PRN to address patient s wound care needs. FACE TO FACE ENCOUNTER: MEDICARE and MEDICAID PATIENTS: I certify that this patient is under my care and that I had a face-to-face encounter that meets the physician face-to-face encounter requirements with this patient on this date. The encounter with the patient was in whole or in part for the [facility], [place] ([id number]) following MEDICAL CONDITION: (primary reason for [place]) MEDICAL NECESSITY: I certify, that based on my findings, NURSING services are a medically necessary home health service. HOME BOUND STATUS: I certify that my clinical findings support that this patient is homebound (i.e., Due to illness or injury, pt requires aid of supportive devices such as crutches, cane, wheelchairs, walkers, the use of special transportation or the assistance of another person to leave their place of residence. There is a normal inability to leave the home and doing so requires considerable and taxing effort. Other absences are for medical reasons /  religious services and are infrequent or of short duration when for other reasons). If current dressing causes regression in wound condition, may D/C ordered dressing product/s and apply Normal Saline Moist Dressing daily until next [place] / Other MD appointment. [place] of regression in wound condition  at [phone]. Please direct any NON-WOUND related issues/requests for orders to patient's Primary Care Physician Wound #2 Left,Posterior Lower Leg: [place] for [place] Nurse may visit PRN to address patient s wound care needs. FACE TO FACE ENCOUNTER: MEDICARE and MEDICAID PATIENTS: I certify that this patient is under my care and that I had a face-to-face encounter that meets the physician face-to-face encounter requirements with this patient on this date. The encounter with the patient was in whole or in part for the following MEDICAL CONDITION: (primary reason for [place]) MEDICAL NECESSITY: I certify, that based on my findings, NURSING services are a medically necessary home health service. HOME BOUND STATUS: I certify that my clinical findings support that this patient is homebound (i.e., Due to illness or injury, pt requires aid of supportive devices such as crutches, cane, wheelchairs, walkers, the use of special transportation or the assistance of another person to leave their place of residence. There is a normal inability to leave the home and doing so requires considerable and taxing effort. Other absences are for medical reasons / religious services and are infrequent or of short duration when for other reasons). If current dressing causes regression in wound condition, may D/C ordered dressing product/s and apply Normal Saline Moist Dressing daily until next [place] / Other MD appointment. [place] of regression in wound condition at [phone]. Please direct any NON-WOUND  related issues/requests for orders to patient's Primary Care Physician This 83 year old gentleman who may have an element of lymphedema and possible venous hypertension would like to continue with conservator therapy has much as possible and after review I have recommended: 1. Silver collagen to be applied and changed every other day. a light Kerlix bandage to hold this in place. 2. elevation and exercise as much possible 3. His PCP and dermatologists were prescribed him antibiotics 4. Seen back in the weeks' time at our office 5. If discussed adequate protein, vitamin A, vitamin C and zinc Electronic Signature(s) Signed: 01/24/2017 4:29:06 PM By: Christin Fudge MD, FACS Previous Signature: 01/24/2017 12:49:30 PM Version By: Christin Fudge MD, FACS Entered By: Christin Fudge on 01/24/2017 16:29:06 [place], [place] ([id number]) [place], [place] ([id number]) -------------------------------------------------------------------------------- ROS/PFSH Details Patient Name: Casey Gates Date of Service: 01/24/2017 10:30 AM Medical Record Number: [id number] Patient Account Number: [id number] Date of Birth/Sex: [birth year] (83 y.o. Male) Treating RN: Cornell Barman Primary Care Provider: Velna Hatchet Other Clinician: Referring Provider: Velna Hatchet Treating Provider/Extender: Frann Rider in Treatment: 0 Information Obtained From Patient Wound History Do you currently have one or more open woundso Yes How many open wounds do you currently haveo 2 Approximately how long have you had your woundso 10 days and 3 weeks How have you been treating your wound(s) until nowo mipirocen cream and ABX Has your wound(s) ever healed and then re-openedo No Have you had any lab work done in the past montho Yes Who ordered the lab work doneo PCP Have you tested positive for an antibiotic resistant organism (MRSA, No VRE)o Have you tested positive for osteomyelitis (bone infection)o No Have you had any tests  for circulation on your legso No Eyes Complaints and Symptoms: No Complaints or Symptoms Complaints and Symptoms: Negative for: Dry Eyes; Vision Changes; Glasses / Contacts Medical History: Negative for: Cataracts; Glaucoma; Optic Neuritis 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 Hematologic/Lymphatic Complaints and Symptoms: No Complaints or Symptoms Complaints and Symptoms: Negative for: Bleeding / Clotting Disorders; Human Immunodeficiency Virus Medical History:  Negative for: Anemia; Hemophilia; Human Immunodeficiency Virus; Lymphedema; Sickle Cell Disease Endocrine [place] ([id number]) Complaints and Symptoms: Negative for: Hepatitis; Thyroid disease; Polydypsia (Excessive Thirst) Medical History: Negative for: Type I Diabetes; Type II Diabetes Integumentary (Skin) Complaints and Symptoms: Positive for: Wounds Negative for: Bleeding or bruising tendency; Breakdown; Swelling Medical History: Negative for: History of Burn; History of pressure wounds Constitutional Symptoms (General Health) Complaints and Symptoms: No Complaints or Symptoms Medical History: Past Medical History Notes: Restless Leg, Chronic Back Pain Ear/Nose/Mouth/Throat Complaints and Symptoms: No Complaints or Symptoms Medical History: Negative for: Chronic sinus problems/congestion; Middle ear problems Respiratory Complaints and Symptoms: No Complaints or Symptoms Medical History: Negative for: Aspiration; Asthma; Chronic Obstructive Pulmonary Disease (COPD); Pneumothorax; Sleep Apnea; Tuberculosis Cardiovascular Complaints and Symptoms: No Complaints or Symptoms Medical History: Negative for: Angina; Arrhythmia; Congestive Heart Failure; Coronary Artery Disease; Deep Vein Thrombosis; Hypertension; Hypotension; Myocardial Infarction; Peripheral Arterial Disease; Peripheral Venous Disease; Phlebitis; Vasculitis [place], [place] ([id number]) Gastrointestinal Complaints and Symptoms: No  Complaints or Symptoms Medical History: Negative for: Cirrhosis ; Colitis; Crohnos; Hepatitis A; Hepatitis B; Hepatitis C Genitourinary Complaints and Symptoms: No Complaints or Symptoms Medical History: Negative for: End Stage Renal Disease Immunological Complaints and Symptoms: No Complaints or Symptoms Medical History: Negative for: Lupus Erythematosus; Raynaudos; Scleroderma Musculoskeletal Complaints and Symptoms: No Complaints or Symptoms Medical History: Negative for: Gout; Rheumatoid Arthritis; Osteoarthritis; Osteomyelitis Neurologic Complaints and Symptoms: No Complaints or Symptoms Medical History: Negative for: Dementia; Neuropathy; Quadriplegia; Paraplegia; Seizure Disorder Oncologic Complaints and Symptoms: No Complaints or Symptoms Medical History: Negative for: Received Chemotherapy; Received Radiation Past Medical History Notes: Prostate Cancer 31 years ago [facility] [place], [place] ([id number]) Complaints and Symptoms: No Complaints or Symptoms Medical History: Negative for: Anorexia/bulimia; Confinement Anxiety Immunizations Pneumococcal Vaccine: Received Pneumococcal Vaccination: Yes Family and Social History Cancer: Yes - Mother; Diabetes: No; Heart Disease: No; Hypertension: Yes - Mother; Kidney Disease: No; Lung Disease: No; Seizures: No; Stroke: No; Thyroid Problems: No; Tuberculosis: No; Never smoker; Marital Status - Widowed; Alcohol Use: Never; Drug Use: No History; Caffeine Use: Moderate; Financial Concerns: No; Food, Clothing or Shelter Needs: No; Support System Lacking: No; Transportation Concerns: No; Advanced Directives: No; Patient does not want information on Advanced Directives; Living Will: No; Medical Power of Attorney: No Physician Affirmation I have reviewed and agree with the above information. Electronic Signature(s) Signed: 01/24/2017 4:08:37 PM By: Gretta Cool RN, BSN, Kim RN, BSN Signed: 01/24/2017 4:26:35 PM By: Christin Fudge MD,  FACS Entered By: Christin Fudge on 01/24/2017 12:41:22 [place], [place] ([id number]) -------------------------------------------------------------------------------- SuperBill Details Patient Name: Casey Gates Date of Service: 01/24/2017 Medical Record Number: [id number] Patient Account Number: [id number] Date of Birth/Sex: 1925/12/17 (83 y.o. Male) Treating RN: Cornell Barman Primary Care Provider: Velna Hatchet Other Clinician: Referring Provider: Velna Hatchet Treating Provider/Extender: Christin Fudge Service Line: Outpatient Weeks in Treatment: 0 Diagnosis Coding ICD-10 Codes Code Description [phone] Laceration without foreign body, left lower leg, initial encounter I89.0 Lymphedema, not elsewhere classified L97.222 Non-pressure chronic ulcer of left calf with fat layer exposed Facility Procedures CPT4 Code Description: [id number] - WOUND CARE VISIT-LEV 3 EST PT Modifier: Quantity: 1 CPT4 Code Description: [id number] - DEB SUBQ TISSUE 20 SQ CM/< ICD-10 Description Diagnosis S81.812A Laceration without foreign body, left lower leg, ini I89.0 Lymphedema, not elsewhere classified L97.222 Non-pressure chronic ulcer of left calf with  fat lay Modifier: tial encounter er exposed Quantity: 1 Physician Procedures CPT4 Code Description: [id number] - WC PHYS LEVEL 4 - NEW PT ICD-10 Description Diagnosis S81.812A Laceration without  foreign body, left lower leg, ini I89.0 Lymphedema, not elsewhere classified L97.222 Non-pressure chronic ulcer of left calf with fat  lay Modifier: 25 tial encounter er exposed Quantity: 1 CPT4 Code Description: [id number] 11042 - WC PHYS SUBQ TISS 20 SQ CM ICD-10 Description Diagnosis S81.812A Laceration without foreign body, left lower leg, ini I89.0 Lymphedema, not elsewhere classified L97.222 Non-pressure chronic ulcer of left calf with  fat lay [place], [place] ([id number]) Modifier: tial encounter er exposed Quantity: 1 Electronic  Signature(s) Signed: 01/24/2017 4:08:37 PM By: Gretta Cool, RN, BSN, Kim RN, BSN Signed: 01/24/2017 4:26:35 PM By: Christin Fudge MD, FACS Previous Signature: 01/24/2017 12:50:45 PM Version By: Christin Fudge MD, FACS Previous Signature: 01/24/2017 12:50:00 PM Version By: Christin Fudge MD, FACS Entered By: Gretta Cool RN, BSN, Kim on 01/24/2017 12:53:06

## 2017-01-27 ENCOUNTER — Ambulatory Visit: Payer: Medicare Other | Admitting: Surgery

## 2017-01-31 ENCOUNTER — Encounter: Payer: Medicare Other | Attending: Surgery | Admitting: Surgery

## 2017-01-31 DIAGNOSIS — Z8546 Personal history of malignant neoplasm of prostate: Secondary | ICD-10-CM | POA: Diagnosis not present

## 2017-01-31 DIAGNOSIS — S81812A Laceration without foreign body, left lower leg, initial encounter: Secondary | ICD-10-CM | POA: Insufficient documentation

## 2017-01-31 DIAGNOSIS — I890 Lymphedema, not elsewhere classified: Secondary | ICD-10-CM | POA: Diagnosis not present

## 2017-01-31 DIAGNOSIS — G894 Chronic pain syndrome: Secondary | ICD-10-CM | POA: Insufficient documentation

## 2017-01-31 DIAGNOSIS — M5136 Other intervertebral disc degeneration, lumbar region: Secondary | ICD-10-CM | POA: Diagnosis not present

## 2017-01-31 DIAGNOSIS — H5440 Blindness, one eye, unspecified eye: Secondary | ICD-10-CM | POA: Insufficient documentation

## 2017-01-31 DIAGNOSIS — X58XXXA Exposure to other specified factors, initial encounter: Secondary | ICD-10-CM | POA: Insufficient documentation

## 2017-01-31 DIAGNOSIS — G2581 Restless legs syndrome: Secondary | ICD-10-CM | POA: Diagnosis not present

## 2017-01-31 DIAGNOSIS — L97222 Non-pressure chronic ulcer of left calf with fat layer exposed: Secondary | ICD-10-CM | POA: Insufficient documentation

## 2017-01-31 DIAGNOSIS — S81802D Unspecified open wound, left lower leg, subsequent encounter: Secondary | ICD-10-CM | POA: Diagnosis not present

## 2017-02-01 NOTE — Progress Notes (Signed)
Casey Gates ([id number]) Visit Report for 01/31/2017 Arrival Information Details Patient Name: Casey Gates, Casey Gates Date of Service: 01/31/2017 12:30 PM Medical Record Number: [id number] Patient Account Number: [id number] Date of Birth/Sex: 25-Oct-1926 (83 y.o. Male) Treating RN: Ahmed Prima Primary Care Lexie Morini: Velna Hatchet Other Clinician: Referring Dandrea Widdowson: Velna Hatchet Treating Caisen Mangas/Extender: Frann Rider in Treatment: 1 Visit Information History Since Last Visit All ordered tests and consults were completed: No Patient Arrived: Casey Gates: No Arrival Time: 12:34 Any new allergies or adverse reactions: No Accompanied By: son Had a fall or experienced change in No Transfer Assistance: EasyPivot activities of daily living that may affect Patient Lift risk of falls: Patient Identification Verified: Yes Signs or symptoms of abuse/neglect since last No Secondary Verification Process Yes visito Completed: Hospitalized since last visit: No Patient Requires Transmission- No Has Dressing in Place as Prescribed: Yes Based Precautions: Pain Present Now: No Patient Has Alerts: Yes Patient Alerts: NOT Diabetic Electronic Signature(s) Signed: 01/31/2017 4:10:42 PM By: Alric Quan Entered By: Alric Quan on 01/31/2017 12:35:46 Casey Gates ([id number]) -------------------------------------------------------------------------------- Clinic Level of Care Assessment Details Patient Name: Casey Gates Date of Service: 01/31/2017 12:30 PM Medical Record Number: [id number] Patient Account Number: [id number] Date of Birth/Sex: 02-04-1926 (83 y.o. Male) Treating RN: Ahmed Prima Primary Care Keslie Gritz: Velna Hatchet Other Clinician: Referring Hazaiah Edgecombe: Velna Hatchet Treating Aava Deland/Extender: Frann Rider in Treatment: 1 Clinic Level of Care Assessment Items TOOL 4 Quantity Score X - Use when only an EandM is performed  on FOLLOW-UP visit 1 0 ASSESSMENTS - Nursing Assessment / Reassessment X - Reassessment of Co-morbidities (includes updates in patient status) 1 10 X - Reassessment of Adherence to Treatment Plan 1 5 ASSESSMENTS - Wound and Skin Assessment / Reassessment X - Simple Wound Assessment / Reassessment - one wound 1 5 []  - Complex Wound Assessment / Reassessment - multiple wounds 0 []  - Dermatologic / Skin Assessment (not related to wound area) 0 ASSESSMENTS - Focused Assessment []  - Circumferential Edema Measurements - multi extremities 0 []  - Nutritional Assessment / Counseling / Intervention 0 []  - Lower Extremity Assessment (monofilament, tuning fork, pulses) 0 []  - Peripheral Arterial Disease Assessment (using hand held doppler) 0 ASSESSMENTS - Ostomy and/or Continence Assessment and Care []  - Incontinence Assessment and Management 0 []  - Ostomy Care Assessment and Management (repouching, etc.) 0 PROCESS - Coordination of Care []  - Simple Patient / Family Education for ongoing care 0 X - Complex (extensive) Patient / Family Education for ongoing care 1 20 X - Staff obtains Programmer, systems, Records, Test Results / Process Orders 1 10 X - Staff telephones HHA, Nursing Homes / Clarify orders / etc 1 10 []  - Routine Transfer to another Facility (non-emergent condition) 0 Casey Gates ([id number]) []  - Routine Hospital Admission (non-emergent condition) 0 []  - New Admissions / Biomedical engineer / Ordering NPWT, Apligraf, etc. 0 []  - Emergency Hospital Admission (emergent condition) 0 X - Simple Discharge Coordination 1 10 []  - Complex (extensive) Discharge Coordination 0 PROCESS - Special Needs []  - Pediatric / Minor Patient Management 0 []  - Isolation Patient Management 0 []  - Hearing / Language / Visual special needs 0 []  - Assessment of Community assistance (transportation, D/C planning, etc.) 0 []  - Additional assistance / Altered mentation 0 []  - Support Surface(s) Assessment (bed,  cushion, seat, etc.) 0 INTERVENTIONS - Wound Cleansing / Measurement X - Simple Wound Cleansing - one wound 1 5 []  - Complex Wound Cleansing - multiple wounds 0 X -  Wound Imaging (photographs - any number of wounds) 1 5 []  - Wound Tracing (instead of photographs) 0 X - Simple Wound Measurement - one wound 1 5 []  - Complex Wound Measurement - multiple wounds 0 INTERVENTIONS - Wound Dressings []  - Small Wound Dressing one or multiple wounds 0 X - Medium Wound Dressing one or multiple wounds 1 15 []  - Large Wound Dressing one or multiple wounds 0 X - Application of Gates - topical 1 5 []  - Application of Gates - injection 0 INTERVENTIONS - Miscellaneous []  - External ear exam 0 Casey Gates ([id number]) []  - Specimen Collection (cultures, biopsies, blood, body fluids, etc.) 0 []  - Specimen(s) / Culture(s) sent or taken to Lab for analysis 0 []  - Patient Transfer (multiple staff / Civil Service fast streamer / Similar devices) 0 []  - Simple Staple / Suture removal (25 or less) 0 []  - Complex Staple / Suture removal (26 or more) 0 []  - Hypo / Hyperglycemic Management (close monitor of Blood Glucose) 0 []  - Ankle / Brachial Index (ABI) - do not check if billed separately 0 X - Vital Signs 1 5 Has the patient been seen at the hospital within the last three years: Yes Total Score: 110 Level Of Care: New/Established - Level 3 Electronic Signature(s) Signed: 01/31/2017 4:10:42 PM By: Alric Quan Entered By: Alric Quan on 01/31/2017 13:55:46 Casey Gates ([id number]) -------------------------------------------------------------------------------- Encounter Discharge Information Details Patient Name: Casey Gates Date of Service: 01/31/2017 12:30 PM Medical Record Number: [id number] Patient Account Number: [id number] Date of Birth/Sex: [birth year] (83 y.o. Male) Treating RN: Ahmed Prima Primary Care Casey Gates: Velna Hatchet Other Clinician: Referring Vivianne Carles: Velna Hatchet Treating Kellene Mccleary/Extender: Frann Rider in Treatment: 1 Encounter Discharge Information Items Discharge Pain Level: 0 Discharge Condition: Stable Ambulatory Status: Walker Discharge Destination: Home Private Transportation: Auto Accompanied By: son Schedule Follow-up Appointment: Yes Medication Reconciliation completed and No provided to Patient/Care Maloree Uplinger: Patient Clinical Summary of Care: Declined Electronic Signature(s) Signed: 01/31/2017 1:06:16 PM By: Sharon Mt Entered By: Sharon Mt on 01/31/2017 13:06:16 [place], [place] ([id number]) -------------------------------------------------------------------------------- Lower Extremity Assessment Details Patient Name: BRANDT, SHELLMAN Date of Service: 01/31/2017 12:30 PM Medical Record Number: [id number] Patient Account Number: [id number] Date of Birth/Sex: May 30, 1926 (83 y.o. Male) Treating RN: Ahmed Prima Primary Care Taegan Haider: Velna Hatchet Other Clinician: Referring Heaven Wandell: Velna Hatchet Treating Chenoa Luddy/Extender: Frann Rider in Treatment: 1 Vascular Assessment Pulses: Dorsalis Pedis Palpable: [Left:Yes] Posterior Tibial Extremity colors, hair growth, and conditions: Extremity Color: [Left:Mottled] Temperature of Extremity: [Left:Warm] Capillary Refill: [Left:< 3 seconds] Electronic Signature(s) Signed: 01/31/2017 4:10:42 PM By: Alric Quan Entered By: Alric Quan on 01/31/2017 12:53:18 Schmiesing, Chayce ([id number]) -------------------------------------------------------------------------------- Multi Wound Chart Details Patient Name: Casey Gates Date of Service: 01/31/2017 12:30 PM Medical Record Number: [id number] Patient Account Number: [id number] Date of Birth/Sex: [birth year] (83 y.o. Male) Treating RN: Ahmed Prima Primary Care Casie Sturgeon: Velna Hatchet Other Clinician: Referring Carel Carrier: Velna Hatchet Treating Hosey Burmester/Extender: Frann Rider in Treatment:  1 Vital Signs Height(in): 73 Pulse(bpm): 64 Weight(lbs): 151 Blood Pressure 118/56 (mmHg): Body Mass Index(BMI): 20 Temperature(F): 97.7 Respiratory Rate 16 (breaths/min): Photos: [1:No Photos] [2:No Photos] [N/A:N/A] Wound Location: [1:Left Lower Leg - Anterior] [2:Left Lower Leg - Posterior] [N/A:N/A] Wounding Event: [1:Trauma] [2:Trauma] [N/A:N/A] Primary Etiology: [1:Trauma, Other] [2:Trauma, Other] [N/A:N/A] Date Acquired: [1:01/10/2017] [2:01/21/2017] [N/A:N/A] Weeks of Treatment: [1:1] [2:1] [N/A:N/A] Wound Status: [1:Healed - Epithelialized] [2:Open] [N/A:N/A] Clustered Wound: [1:Yes] [2:No] [N/A:N/A] Measurements L x W x D 0x0x0 [2:5x0.4x0.3] [N/A:N/A] (cm) Area (cm) : [1:0] [2:1.571] [N/A:N/A] Volume (cm) : [1:0] [  2:0.471] [N/A:N/A] % Reduction in Area: [1:100.00%] [2:64.30%] [N/A:N/A] % Reduction in Volume: 100.00% [2:73.20%] [N/A:N/A] Classification: [1:Partial Thickness] [2:Full Thickness Without Exposed Support Structures] [N/A:N/A] Exudate Amount: [1:None Present] [2:Large] [N/A:N/A] Exudate Type: [1:N/A] [2:Serous] [N/A:N/A] Exudate Color: [1:N/A] [2:amber] [N/A:N/A] Wound Margin: [1:N/A] [2:Flat and Intact] [N/A:N/A] Granulation Amount: [1:None Present (0%)] [2:Large (67-100%)] [N/A:N/A] Granulation Quality: [1:N/A] [2:Red] [N/A:N/A] Necrotic Amount: [1:None Present (0%)] [2:Small (1-33%)] [N/A:N/A] Epithelialization: [1:Large (67-100%)] [2:None] [N/A:N/A] Periwound Skin Texture: No Abnormalities Noted [2:Excoriation: Yes Induration: Yes Callus: No Crepitus: No] [N/A:N/A] Rash: No Scarring: No Periwound Skin No Abnormalities Noted Maceration: No N/A Moisture: Dry/Scaly: No Periwound Skin Color: No Abnormalities Noted Atrophie Blanche: No N/A Cyanosis: No Ecchymosis: No Erythema: No Hemosiderin Staining: No Mottled: No Pallor: No Rubor: No Temperature: No Abnormality No Abnormality N/A Tenderness on No Yes N/A Palpation: Wound  Preparation: Ulcer Cleansing: Ulcer Cleansing: N/A Rinsed/Irrigated with Rinsed/Irrigated with Saline Saline Topical Anesthetic Topical Anesthetic Applied: None Applied: Other: lidocaine 4% Treatment Notes Wound #2 (Left, Posterior Lower Leg) 1. Cleansed with: Clean wound with Normal Saline 2. Anesthetic Topical Lidocaine 4% cream to wound bed prior to debridement 4. Dressing Applied: Aquacel Ag 5. Secondary Dressing Applied ABD Pad Kerlix/Conform 7. Secured with Recruitment consultant) Signed: 01/31/2017 1:03:06 PM By: Christin Fudge MD, FACS Entered By: Christin Fudge on 01/31/2017 13:03:06 [place], [place] ([id number]) -------------------------------------------------------------------------------- [place] Details Patient Name: Casey Gates Date of Service: 01/31/2017 12:30 PM Medical Record Number: [id number] Patient Account Number: [id number] Date of Birth/Sex: 08/06/1926 (83 y.o. Male) Treating RN: Ahmed Prima Primary Care Laketra Bowdish: Velna Hatchet Other Clinician: Referring Amsi Grimley: Velna Hatchet Treating Kayle Passarelli/Extender: Frann Rider in Treatment: 1 Active Inactive ` Abuse / Safety / Falls / Self Care Management Nursing Diagnoses: Impaired physical mobility Potential for falls Goals: Patient will remain injury free Date Initiated: 01/24/2017 Target Resolution Date: 02/24/2017 Goal Status: Active Interventions: Assess fall risk on admission and as needed Treatment Activities: Patient referred to home care : 01/24/2017 Notes: ` Orientation to the Wound Care Program Nursing Diagnoses: Knowledge deficit related to the wound healing center program Goals: Patient/caregiver will verbalize understanding of the [place] Program Date Initiated: 01/24/2017 Target Resolution Date: 02/24/2017 Goal Status: Active Interventions: Provide education on orientation to the wound center Notes: ` Soft Tissue Infection [place],  [place] ([id number]) Nursing Diagnoses: Impaired tissue integrity Potential for infection: soft tissue Goals: Patient will remain free of wound infection Date Initiated: 01/24/2017 Target Resolution Date: 02/24/2017 Goal Status: Active Interventions: Assess signs and symptoms of infection every visit Treatment Activities: Systemic antibiotics : 01/24/2017 Notes: ` Wound/Skin Impairment Nursing Diagnoses: Impaired tissue integrity Goals: Ulcer/skin breakdown will have a volume reduction of 30% by week 4 Date Initiated: 01/24/2017 Target Resolution Date: 02/14/2017 Goal Status: Active Interventions: Assess patient/caregiver ability to obtain necessary supplies Treatment Activities: Patient referred to home care : 01/24/2017 Notes: Electronic Signature(s) Signed: 01/31/2017 4:10:42 PM By: Alric Quan Entered By: Alric Quan on 01/31/2017 12:53:21 [place], [place] ([id number]) -------------------------------------------------------------------------------- Pain Assessment Details Patient Name: Casey Gates Date of Service: 01/31/2017 12:30 PM Medical Record Number: [id number] Patient Account Number: [id number] Date of Birth/Sex: October 19, 1926 (83 y.o. Male) Treating RN: Ahmed Prima Primary Care Andru Genter: Velna Hatchet Other Clinician: Referring Chukwudi Ewen: Velna Hatchet Treating Darah Simkin/Extender: Frann Rider in Treatment: 1 Active Problems Location of Pain Severity and Description of Pain Patient Has Paino No Site Locations With Dressing Change: No Pain Management and Medication Current Pain Management: Electronic Signature(s) Signed: 01/31/2017 4:10:42 PM By: Alric Quan Entered By: Alric Quan  on 01/31/2017 12:35:52 [place], [place] ([id number]) -------------------------------------------------------------------------------- Patient/Caregiver Education Details Patient Name: CAMERAN, OSTOS Date of Service: 01/31/2017 12:30 PM Medical Record Number:  [id number] Patient Account Number: [id number] Date of Birth/Gender: 04/19/1926 (83 y.o. Male) Treating RN: Ahmed Prima Primary Care Physician: Velna Hatchet Other Clinician: Referring Physician: Velna Hatchet Treating Physician/Extender: Frann Rider in Treatment: 1 Education Assessment Education Provided To: Patient Education Topics Provided Wound/Skin Impairment: Handouts: Other: change dressing as ordered Methods: Demonstration, Explain/Verbal Responses: State content correctly Electronic Signature(s) Signed: 01/31/2017 4:10:42 PM By: Alric Quan Entered By: Alric Quan on 01/31/2017 12:55:17 [place], [place] ([id number]) -------------------------------------------------------------------------------- Wound Assessment Details Patient Name: Casey Gates Date of Service: 01/31/2017 12:30 PM Medical Record Number: [id number] Patient Account Number: [id number] Date of Birth/Sex: Nov 29, 1926 (83 y.o. Male) Treating RN: Ahmed Prima Primary Care Casimira Sutphin: Velna Hatchet Other Clinician: Referring Luva Metzger: Velna Hatchet Treating Aubriee Szeto/Extender: Frann Rider in Treatment: 1 Wound Status Wound Number: 1 Primary Etiology: Trauma, Other Wound Location: Left Lower Leg - Anterior Wound Status: Healed - Epithelialized Wounding Event: Trauma Date Acquired: 01/10/2017 Weeks Of Treatment: 1 Clustered Wound: Yes Photos Photo Uploaded By: Alric Quan on 01/31/2017 14:09:41 Wound Measurements Length: (cm) 0 % Reduction Width: (cm) 0 % Reduction Depth: (cm) 0 Epithelializ Area: (cm) 0 Tunneling: Volume: (cm) 0 Undermining in Area: 100% in Volume: 100% ation: Large (67-100%) No : No Wound Description Classification: Partial Thickness Exudate Amount: None Present Foul Odor After Cleansing: No Slough/Fibrino No Wound Bed Granulation Amount: None Present (0%) Necrotic Amount: None Present (0%) Periwound Skin Texture Texture Color No  Abnormalities Noted: No No Abnormalities Noted: No Moisture Temperature / Pain Lep[place], Jaxsen ([id number]) No Abnormalities Noted: No Temperature: No Abnormality Wound Preparation Ulcer Cleansing: Rinsed/Irrigated with Saline Topical Anesthetic Applied: None Electronic Signature(s) Signed: 01/31/2017 4:10:42 PM By: Alric Quan Entered By: Alric Quan on 01/31/2017 12:51:34 [place], [place] ([id number]) -------------------------------------------------------------------------------- Wound Assessment Details Patient Name: Casey Gates Date of Service: 01/31/2017 12:30 PM Medical Record Number: [id number] Patient Account Number: [id number] Date of Birth/Sex: [birth year] (83 y.o. Male) Treating RN: Ahmed Prima Primary Care Abbigale Mcelhaney: Velna Hatchet Other Clinician: Referring Mandeep Ferch: Velna Hatchet Treating Nedra Mcinnis/Extender: Frann Rider in Treatment: 1 Wound Status Wound Number: 2 Primary Etiology: Trauma, Other Wound Location: Left Lower Leg - Posterior Wound Status: Open Wounding Event: Trauma Date Acquired: 01/21/2017 Weeks Of Treatment: 1 Clustered Wound: No Photos Photo Uploaded By: Alric Quan on 01/31/2017 14:09:42 Wound Measurements Length: (cm) 5 Width: (cm) 0.4 Depth: (cm) 0.3 Area: (cm) 1.571 Volume: (cm) 0.471 % Reduction in Area: 64.3% % Reduction in Volume: 73.2% Epithelialization: None Tunneling: No Undermining: No Wound Description Full Thickness Without Exposed Classification: Support Structures Wound Margin: Flat and Intact Exudate Large Amount: Exudate Type: Serous Exudate Color: amber Foul Odor After Cleansing: No Slough/Fibrino Yes Wound Bed Granulation Amount: Large (67-100%) Exposed Structure Granulation Quality: Red Fascia Exposed: No Necrotic Amount: Small (1-33%) Fat Layer (Subcutaneous Tissue) Exposed: Yes [place], Seth ([id number]) Necrotic Quality: Adherent Slough Tendon Exposed: No Muscle Exposed:  No Joint Exposed: No Bone Exposed: No Periwound Skin Texture Texture Color No Abnormalities Noted: No No Abnormalities Noted: No Callus: No Atrophie Blanche: No Crepitus: No Cyanosis: No Excoriation: Yes Ecchymosis: No Induration: Yes Erythema: No Rash: No Hemosiderin Staining: No Scarring: No Mottled: No Pallor: No Moisture Rubor: No No Abnormalities Noted: No Dry / Scaly: No Temperature / Pain Maceration: No Temperature: No Abnormality Tenderness on Palpation: Yes Wound Preparation Ulcer Cleansing: Rinsed/Irrigated with Saline Topical Anesthetic Applied: Other: lidocaine 4%, Treatment Notes Wound #2 (  Left, Posterior Lower Leg) 1. Cleansed with: Clean wound with Normal Saline 2. Anesthetic Topical Lidocaine 4% cream to wound bed prior to debridement 4. Dressing Applied: Aquacel Ag 5. Secondary Dressing Applied ABD Pad Kerlix/Conform 7. Secured with Recruitment consultant) Signed: 01/31/2017 4:10:42 PM By: Alric Quan Entered By: Alric Quan on 01/31/2017 12:47:05 [place], [place] ([id number]) -------------------------------------------------------------------------------- [place] Details Patient Name: Casey Gates Date of Service: 01/31/2017 12:30 PM Medical Record Number: [id number] Patient Account Number: [id number] Date of Birth/Sex: 1926/01/07 (83 y.o. Male) Treating RN: Ahmed Prima Primary Care Enio Hornback: Velna Hatchet Other Clinician: Referring Joshva Labreck: Velna Hatchet Treating Jarret Torre/Extender: Frann Rider in Treatment: 1 Vital Signs Time Taken: 12:35 Temperature (F): 97.7 Height (in): 73 Pulse (bpm): 64 Weight (lbs): 151 Respiratory Rate (breaths/min): 16 Body Mass Index (BMI): 19.9 Blood Pressure (mmHg): 118/56 Reference Range: 80 - 120 mg / dl Electronic Signature(s) Signed: 01/31/2017 4:10:42 PM By: Alric Quan Entered By: Alric Quan on 01/31/2017 12:37:39

## 2017-02-01 NOTE — Progress Notes (Signed)
ANIKETH, GRASSL ([id number]) Visit Report for 01/31/2017 Chief Complaint Document Details Patient Name: Casey Gates, Casey Gates Date of Service: 01/31/2017 12:30 PM Medical Record Number: [id number] Patient Account Number: [id number] Date of Birth/Sex: 1925-12-07 (83 y.o. Male) Treating RN: Ahmed Prima Primary Care Provider: Velna Hatchet Other Clinician: Referring Provider: Velna Hatchet Treating Provider/Extender: Frann Rider in Treatment: 1 Information Obtained from: Patient Chief Complaint Patient seen for complaints of Non-Healing Wound to the left lower extremity which she's had for about a month. Electronic Signature(s) Signed: 01/31/2017 1:03:32 PM By: Christin Fudge MD, FACS Entered By: Christin Fudge on 01/31/2017 13:03:32 [place], Elija ([id number]) -------------------------------------------------------------------------------- HPI Details Patient Name: Casey Gates Date of Service: 01/31/2017 12:30 PM Medical Record Number: [id number] Patient Account Number: [id number] Date of Birth/Sex: 1926-10-05 (83 y.o. Male) Treating RN: Ahmed Prima Primary Care Provider: Velna Hatchet Other Clinician: Referring Provider: Velna Hatchet Treating Provider/Extender: Frann Rider in Treatment: 1 History of Present Illness Location: anterior and posterior lateral left lower extremity Quality: Patient reports experiencing a dull pain to affected area(s). Severity: Patient states wound (s) are getting better. Duration: Patient has had the wound for < 4 weeks prior to presenting for treatment Timing: Pain in wound is constant (hurts all the time) Context: The wound occurred when the patient his walker with his legs and had injuries to them Modifying Factors: Other treatment(s) tried include:as been seeing dermatology for management of his problems Associated Signs and Symptoms: Patient reports having increase swelling. HPI Description: A fairly healthy 83 year old gentleman  whose chronic medical problems include lumbar degenerative disc disease, kyphoscoliosis, chronic pain syndrome, history of syncope, history of food impaction, prostate cancer treated via TURP in 1997, left eye blindness and stasis and edema in the lower extremities. he walks with the help of a walker and on 2 separate occasions has injured his legs more on the left side with a left anterior and left posterior lateral injuries. Is been seen by dermatology for problems with stasis ulceration and lymphedema in the past and this time around has been treated by them with local care and a couple of courses of antibiotics which include doxycycline and Bactrim. Also been applying Bactroban ointment locally. 01/31/2017 -- he's been having dressing changes 3 times a week but still has a lot of drainage Electronic Signature(s) Signed: 01/31/2017 1:04:01 PM By: Christin Fudge MD, FACS Entered By: Christin Fudge on 01/31/2017 13:04:01 [place], Kori ([id number]) -------------------------------------------------------------------------------- Physical Exam Details Patient Name: Casey Gates Date of Service: 01/31/2017 12:30 PM Medical Record Number: [id number] Patient Account Number: [id number] Date of Birth/Sex: 1926/01/29 (83 y.o. Male) Treating RN: Ahmed Prima Primary Care Provider: Velna Hatchet Other Clinician: Referring Provider: Velna Hatchet Treating Provider/Extender: Frann Rider in Treatment: 1 Constitutional . Pulse regular. Respirations normal and unlabored. Afebrile. . Eyes Nonicteric. Reactive to light. Ears, Nose, Mouth, and Throat Lips, teeth, and gums WNL.Marland Kitchen Moist mucosa without lesions. Neck supple and nontender. No palpable supraclavicular or cervical adenopathy. Normal sized without goiter. Respiratory WNL. No retractions.. Breath sounds WNL, No rubs, rales, rhonchi, or wheeze.. Cardiovascular Heart rhythm and rate regular, no murmur or gallop.. Pedal Pulses WNL. No  clubbing, cyanosis or edema. Lymphatic No adneopathy. No adenopathy. No adenopathy. Musculoskeletal Adexa without tenderness or enlargement.. Digits and nails w/o clubbing, cyanosis, infection, petechiae, ischemia, or inflammatory conditions.. Integumentary (Hair, Skin) No suspicious lesions. No crepitus or fluctuance. No peri-wound warmth or erythema. No masses.Marland Kitchen Psychiatric Judgement and insight Intact.. No evidence of depression, anxiety, or agitation.. Notes he has got a lot of  microperforations on both medial and lateral calf areas with significant amount of weeping. The left posterior lateral ulceration which is linear did not need any sharp debridement today. Electronic Signature(s) Signed: 01/31/2017 1:04:50 PM By: Christin Fudge MD, FACS Entered By: Christin Fudge on 01/31/2017 13:04:49 [place], Pratt ([id number]) -------------------------------------------------------------------------------- Physician Orders Details Patient Name: Casey Gates Date of Service: 01/31/2017 12:30 PM Medical Record Number: [id number] Patient Account Number: [id number] Date of Birth/Sex: [birth year] (83 y.o. Male) Treating RN: Ahmed Prima Primary Care Provider: Velna Hatchet Other Clinician: Referring Provider: Velna Hatchet Treating Provider/Extender: Frann Rider in Treatment: 1 Verbal / Phone Orders: Yes ClinicianCarolyne Fiscal, Debi Read Back and Verified: Yes Diagnosis Coding Wound Cleansing Wound #2 Left,Posterior Lower Leg o Clean wound with Normal Saline. Anesthetic Wound #2 Left,Posterior Lower Leg o Topical Lidocaine 4% cream applied to wound bed prior to debridement - in clinic only Primary Wound Dressing Wound #2 Left,Posterior Lower Leg o Aquacel Ag Secondary Dressing Wound #2 Left,Posterior Lower Leg o ABD and Kerlix/Conform Dressing Change Frequency Wound #2 Left,Posterior Lower Leg o Change Dressing Monday, Wednesday, Friday Follow-up  Appointments Wound #2 Left,Posterior Lower Leg o Return Appointment in 1 week. Home Health Wound #2 [place] Visits - [place] Nurse may visit PRN to address patientos wound care needs. o FACE TO FACE ENCOUNTER: MEDICARE and MEDICAID PATIENTS: I certify that this patient is under my care and that I had a face-to-face encounter that meets the physician face-to-face encounter requirements with this patient on this date. The encounter with the patient was in whole or in part for the following MEDICAL CONDITION: (primary reason for [place]) MEDICAL NECESSITY: I certify, that based on my findings, NURSING services are a medically necessary home health service. HOME BOUND STATUS: I certify that my clinical findings support that this patient is homebound (i.e., Due to illness or injury, pt requires aid of [place] ([id number]) supportive devices such as crutches, cane, wheelchairs, walkers, the use of special transportation or the assistance of another person to leave their place of residence. There is a normal inability to leave the home and doing so requires considerable and taxing effort. Other absences are for medical reasons / religious services and are infrequent or of short duration when for other reasons). o If current dressing causes regression in wound condition, may D/C ordered dressing product/s and apply Normal Saline Moist Dressing daily until next [name] / Other MD appointment. [place] of regression in wound condition at [phone]. o Please direct any NON-WOUND related issues/requests for orders to patient's Primary Care Physician Electronic Signature(s) Signed: 01/31/2017 3:38:42 PM By: Christin Fudge MD, FACS Signed: 01/31/2017 4:10:42 PM By: Alric Quan Entered By: Alric Quan on 01/31/2017 12:54:15 [place], [place]  ([id number]) -------------------------------------------------------------------------------- Problem List Details Patient Name: Casey Gates Date of Service: 01/31/2017 12:30 PM Medical Record Number: [id number] Patient Account Number: [id number] Date of Birth/Sex: 21-Feb-1926 (83 y.o. Male) Treating RN: Ahmed Prima Primary Care Provider: Velna Hatchet Other Clinician: Referring Provider: Velna Hatchet Treating Provider/Extender: Frann Rider in Treatment: 1 Active Problems ICD-10 Encounter Code Description Active Date Diagnosis S81.812A Laceration without foreign body, left lower leg, initial 01/24/2017 Yes encounter I89.0 Lymphedema, not elsewhere classified 01/24/2017 Yes L97.222 Non-pressure chronic ulcer of left calf with fat layer 01/24/2017 Yes exposed Inactive Problems Resolved Problems Electronic Signature(s) Signed: 01/31/2017 1:03:00 PM By: Christin Fudge MD, FACS Entered By: Christin Fudge on 01/31/2017 13:03:00 [place], [place] ([id number]) -------------------------------------------------------------------------------- Progress Note  Details Patient Name: MARLONE, PARADISO Date of Service: 01/31/2017 12:30 PM Medical Record Number: [id number] Patient Account Number: [id number] Date of Birth/Sex: 04-Aug-1926 (83 y.o. Male) Treating RN: Ahmed Prima Primary Care Provider: Velna Hatchet Other Clinician: Referring Provider: Velna Hatchet Treating Provider/Extender: Frann Rider in Treatment: 1 Subjective Chief Complaint Information obtained from Patient Patient seen for complaints of Non-Healing Wound to the left lower extremity which she's had for about a month. History of Present Illness (HPI) The following HPI elements were documented for the patient's wound: Location: anterior and posterior lateral left lower extremity Quality: Patient reports experiencing a dull pain to affected area(s). Severity: Patient states wound (s) are getting  better. Duration: Patient has had the wound for < 4 weeks prior to presenting for treatment Timing: Pain in wound is constant (hurts all the time) Context: The wound occurred when the patient his walker with his legs and had injuries to them Modifying Factors: Other treatment(s) tried include:as been seeing dermatology for management of his problems Associated Signs and Symptoms: Patient reports having increase swelling. A fairly healthy 83 year old gentleman whose chronic medical problems include lumbar degenerative disc disease, kyphoscoliosis, chronic pain syndrome, history of syncope, history of food impaction, prostate cancer treated via TURP in 1997, left eye blindness and stasis and edema in the lower extremities. he walks with the help of a walker and on 2 separate occasions has injured his legs more on the left side with a left anterior and left posterior lateral injuries. Is been seen by dermatology for problems with stasis ulceration and lymphedema in the past and this time around has been treated by them with local care and a couple of courses of antibiotics which include doxycycline and Bactrim. Also been applying Bactroban ointment locally. 01/31/2017 -- he's been having dressing changes 3 times a week but still has a lot of drainage Objective Constitutional Pulse regular. Respirations normal and unlabored. Afebrile. [place] ([id number]) Vitals Time Taken: 12:35 PM, Height: 73 in, Weight: 151 lbs, BMI: 19.9, Temperature: 97.7 F, Pulse: 64 bpm, Respiratory Rate: 16 breaths/min, Blood Pressure: 118/56 mmHg. Eyes Nonicteric. Reactive to light. Ears, Nose, Mouth, and Throat Lips, teeth, and gums WNL.Marland Kitchen Moist mucosa without lesions. Neck supple and nontender. No palpable supraclavicular or cervical adenopathy. Normal sized without goiter. Respiratory WNL. No retractions.. Breath sounds WNL, No rubs, rales, rhonchi, or wheeze.. Cardiovascular Heart rhythm and rate  regular, no murmur or gallop.. Pedal Pulses WNL. No clubbing, cyanosis or edema. Lymphatic No adneopathy. No adenopathy. No adenopathy. Musculoskeletal Adexa without tenderness or enlargement.. Digits and nails w/o clubbing, cyanosis, infection, petechiae, ischemia, or inflammatory conditions.Marland Kitchen Psychiatric Judgement and insight Intact.. No evidence of depression, anxiety, or agitation.. General Notes: he has got a lot of microperforations on both medial and lateral calf areas with significant amount of weeping. The left posterior lateral ulceration which is linear did not need any sharp debridement today. Integumentary (Hair, Skin) No suspicious lesions. No crepitus or fluctuance. No peri-wound warmth or erythema. No masses.. Wound #1 status is Healed - Epithelialized. Original cause of wound was Trauma. The wound is located on the Left,Anterior Lower Leg. The wound measures 0cm length x 0cm width x 0cm depth; 0cm^2 area and 0cm^3 volume. There is no tunneling or undermining noted. There is a none present amount of drainage noted. There is no granulation within the wound bed. There is no necrotic tissue within the wound bed. Periwound temperature was noted as No Abnormality. Wound #2 status is Open. Original cause of wound  was Trauma. The wound is located on the Left,Posterior Lower Leg. The wound measures 5cm length x 0.4cm width x 0.3cm depth; 1.571cm^2 area and 0.471cm^3 volume. There is Fat Layer (Subcutaneous Tissue) Exposed exposed. There is no tunneling or undermining noted. There is a large amount of serous drainage noted. The wound margin is flat and intact. There is large (67-100%) red granulation within the wound bed. There is a small (1-33%) amount of necrotic tissue within the wound bed including Adherent Slough. The periwound skin appearance exhibited: Excoriation, Induration. The periwound skin appearance did not exhibit: Callus, Crepitus, Rash, Scarring, Dry/Scaly,  Maceration, Atrophie Blanche, Cyanosis, Ecchymosis, Hemosiderin Staining, Mottled, Pallor, Ausmus, [place] ([id number]) Rubor, Erythema. Periwound temperature was noted as No Abnormality. The periwound has tenderness on palpation. Assessment Active Problems ICD-10 S81.812A - Laceration without foreign body, left lower leg, initial encounter I89.0 - Lymphedema, not elsewhere classified L97.222 - Non-pressure chronic ulcer of left calf with fat layer exposed Plan Wound Cleansing: Wound #2 Left,Posterior Lower Leg: Clean wound with Normal Saline. Anesthetic: Wound #2 Left,Posterior Lower Leg: Topical Lidocaine 4% cream applied to wound bed prior to debridement - in clinic only Primary Wound Dressing: Wound #2 Left,Posterior Lower Leg: Aquacel Ag Secondary Dressing: Wound #2 Left,Posterior Lower Leg: ABD and Kerlix/Conform Dressing Change Frequency: Wound #2 Left,Posterior Lower Leg: Change Dressing Monday, Wednesday, Friday Follow-up Appointments: Wound #2 Left,Posterior Lower Leg: Return Appointment in 1 week. Home Health: Wound #2 Left,Posterior Lower Leg: Continue Home Health Visits - [place] Nurse may visit PRN to address patient s wound care needs. FACE TO FACE ENCOUNTER: MEDICARE and MEDICAID PATIENTS: I certify that this patient is under my care and that I had a face-to-face encounter that meets the physician face-to-face encounter requirements with this patient on this date. The encounter with the patient was in whole or in part for the following MEDICAL CONDITION: (primary reason for [place]) MEDICAL NECESSITY: I certify, that based on my findings, NURSING services are a medically necessary home health service. HOME BOUND STATUS: I certify that my clinical findings support that this patient is homebound (i.e., Due to [facility] [place] ([id number]) illness or injury, pt requires aid of supportive devices such as crutches, cane, wheelchairs, walkers, the  use of special transportation or the assistance of another person to leave their place of residence. There is a normal inability to leave the home and doing so requires considerable and taxing effort. Other absences are for medical reasons / religious services and are infrequent or of short duration when for other reasons). If current dressing causes regression in wound condition, may D/C ordered dressing product/s and apply Normal Saline Moist Dressing daily until next [place] / Other MD appointment. [place] of regression in wound condition at [phone]. Please direct any NON-WOUND related issues/requests for orders to patient's Primary Care Physician after review I have recommended: 1. Silver alginate to be applied and changed every other day. a light Kerlix bandage to hold this in place. 2. elevation and exercise as much possible 3. His PCP and dermatologists were prescribed him antibiotics 4. Seen back in the weeks' time at our office 5. If discussed adequate protein, vitamin A, vitamin C and zinc Electronic Signature(s) Signed: 01/31/2017 1:05:41 PM By: Christin Fudge MD, FACS Entered By: Christin Fudge on 01/31/2017 13:05:41 [place], Kris ([id number]) -------------------------------------------------------------------------------- SuperBill Details Patient Name: Casey Gates Date of Service: 01/31/2017 Medical Record Number: [id number] Patient Account Number: [id number] Date of Birth/Sex: 1926/04/29 (83 y.o. Male) Treating RN:  Carolyne Fiscal, Debi Primary Care Provider: Velna Hatchet Other Clinician: Referring Provider: Velna Hatchet Treating Provider/Extender: Christin Fudge Service Line: Outpatient Weeks in Treatment: 1 Diagnosis Coding ICD-10 Codes Code Description [phone] Laceration without foreign body, left lower leg, initial encounter I89.0 Lymphedema, not elsewhere classified L97.222 Non-pressure chronic ulcer of left calf with fat layer  exposed Facility Procedures CPT4 Code: [id number] Description: 99213 - WOUND CARE VISIT-LEV 3 EST PT Modifier: Quantity: 1 Physician Procedures CPT4 Code Description: [id number] 99213 - WC PHYS LEVEL 3 - EST PT ICD-10 Description Diagnosis S81.812A Laceration without foreign body, left lower leg, ini I89.0 Lymphedema, not elsewhere classified L97.222 Non-pressure chronic ulcer of left calf with fat  lay Modifier: tial encounter er exposed Quantity: 1 Electronic Signature(s) Signed: 01/31/2017 3:38:42 PM By: Christin Fudge MD, FACS Signed: 01/31/2017 4:10:42 PM By: Alric Quan Previous Signature: 01/31/2017 1:06:06 PM Version By: Christin Fudge MD, FACS Entered By: Alric Quan on 01/31/2017 13:55:54

## 2017-02-07 ENCOUNTER — Encounter: Payer: Medicare Other | Admitting: Surgery

## 2017-02-07 DIAGNOSIS — L97222 Non-pressure chronic ulcer of left calf with fat layer exposed: Secondary | ICD-10-CM | POA: Diagnosis not present

## 2017-02-08 NOTE — Progress Notes (Addendum)
TONNIE, FRIEDEL ([id number]) Visit Report for 02/07/2017 Chief Complaint Document Details Patient Name: Casey Gates, Casey Gates Date of Service: 02/07/2017 9:45 AM Medical Record Number: [id number] Patient Account Number: [id number] Date of Birth/Sex: 05-21-1926 (83 y.o. Male) Treating RN: Ahmed Prima Primary Care Provider: Velna Hatchet Other Clinician: Referring Provider: Velna Hatchet Treating Provider/Extender: Frann Rider in Treatment: 2 Information Obtained from: Patient Chief Complaint Patient seen for complaints of Non-Healing Wound to the left lower extremity which she's had for about a month. Electronic Signature(s) Signed: 02/07/2017 10:12:17 AM By: Christin Fudge MD, FACS Entered By: Christin Fudge on 02/07/2017 10:12:16 [place], Diar ([id number]) -------------------------------------------------------------------------------- HPI Details Patient Name: Casey Gates Date of Service: 02/07/2017 9:45 AM Medical Record Number: [id number] Patient Account Number: [id number] Date of Birth/Sex: [birth year] (83 y.o. Male) Treating RN: Ahmed Prima Primary Care Provider: Velna Hatchet Other Clinician: Referring Provider: Velna Hatchet Treating Provider/Extender: Frann Rider in Treatment: 2 History of Present Illness Location: anterior and posterior lateral left lower extremity Quality: Patient reports experiencing a dull pain to affected area(s). Severity: Patient states wound (s) are getting better. Duration: Patient has had the wound for < 4 weeks prior to presenting for treatment Timing: Pain in wound is constant (hurts all the time) Context: The wound occurred when the patient his walker with his legs and had injuries to them Modifying Factors: Other treatment(s) tried include:as been seeing dermatology for management of his problems Associated Signs and Symptoms: Patient reports having increase swelling. HPI Description: A fairly healthy 83 year old gentleman  whose chronic medical problems include lumbar degenerative disc disease, kyphoscoliosis, chronic pain syndrome, history of syncope, history of food impaction, prostate cancer treated via TURP in 1997, left eye blindness and stasis and edema in the lower extremities. he walks with the help of a walker and on 2 separate occasions has injured his legs more on the left side with a left anterior and left posterior lateral injuries. Is been seen by dermatology for problems with stasis ulceration and lymphedema in the past and this time around has been treated by them with local care and a couple of courses of antibiotics which include doxycycline and Bactrim. Also been applying Bactroban ointment locally. 01/31/2017 -- he's been having dressing changes 3 times a week but still has a lot of drainage. Electronic Signature(s) Signed: 02/07/2017 10:12:26 AM By: Christin Fudge MD, FACS Entered By: Christin Fudge on 02/07/2017 10:12:26 [place], Keturah Barre ([id number]) -------------------------------------------------------------------------------- Physical Exam Details Patient Name: Casey Gates Date of Service: 02/07/2017 9:45 AM Medical Record Number: [id number] Patient Account Number: [id number] Date of Birth/Sex: 1926-06-30 (83 y.o. Male) Treating RN: Ahmed Prima Primary Care Provider: Velna Hatchet Other Clinician: Referring Provider: Velna Hatchet Treating Provider/Extender: Frann Rider in Treatment: 2 Constitutional . Pulse regular. Respirations normal and unlabored. Afebrile. . Eyes Nonicteric. Reactive to light. Ears, Nose, Mouth, and Throat Lips, teeth, and gums WNL.Marland Kitchen Moist mucosa without lesions. Neck supple and nontender. No palpable supraclavicular or cervical adenopathy. Normal sized without goiter. Respiratory WNL. No retractions.. Breath sounds WNL, No rubs, rales, rhonchi, or wheeze.. Cardiovascular Heart rhythm and rate regular, no murmur or gallop.. Pedal Pulses WNL. No  clubbing, cyanosis or edema. Lymphatic No adneopathy. No adenopathy. No adenopathy. Musculoskeletal Adexa without tenderness or enlargement.. Digits and nails w/o clubbing, cyanosis, infection, petechiae, ischemia, or inflammatory conditions.. Integumentary (Hair, Skin) No suspicious lesions. No crepitus or fluctuance. No peri-wound warmth or erythema. No masses.Marland Kitchen Psychiatric Judgement and insight Intact.. No evidence of depression, anxiety, or agitation.. Notes the lymphedema has gone down significantly  and there is no evidence of any lymphorrhea or microperforations today. The left posterior lateral linear ulcer looks clean and has epithelialized well. Electronic Signature(s) Signed: 02/07/2017 10:13:03 AM By: Christin Fudge MD, FACS Entered By: Christin Fudge on 02/07/2017 10:13:02 [place], Keturah Barre ([id number]) -------------------------------------------------------------------------------- Physician Orders Details Patient Name: Casey Gates Date of Service: 02/07/2017 9:45 AM Medical Record Number: [id number] Patient Account Number: [id number] Date of Birth/Sex: Mar 21, 1926 (83 y.o. Male) Treating RN: Ahmed Prima Primary Care Provider: Velna Hatchet Other Clinician: Referring Provider: Velna Hatchet Treating Provider/Extender: Frann Rider in Treatment: 2 Verbal / Phone Orders: Yes ClinicianCarolyne Fiscal, Debi Read Back and Verified: Yes Diagnosis Coding Wound Cleansing Wound #2 Left,Posterior Lower Leg o Clean wound with Normal Saline. Anesthetic Wound #2 Left,Posterior Lower Leg o Topical Lidocaine 4% cream applied to wound bed prior to debridement - in clinic only Primary Wound Dressing Wound #2 Left,Posterior Lower Leg o Aquacel Ag Secondary Dressing Wound #2 Left,Posterior Lower Leg o ABD and Kerlix/Conform Dressing Change Frequency Wound #2 Left,Posterior Lower Leg o Change Dressing Monday, Wednesday, Friday Follow-up Appointments Wound #2  Left,Posterior Lower Leg o Return Appointment in 1 week. Edema Control Wound #2 Left,Posterior Lower Leg o Elevate legs to the level of the heart and pump ankles as often as possible o Other: - pt to wear own compression stockings Home Health Wound #2 [place] Visits - [place] Nurse may visit PRN to address patientos wound care needs. o FACE TO FACE ENCOUNTER: MEDICARE and MEDICAID PATIENTS: I certify that this patient is under my care and that I had a face-to-face encounter that meets the physician face-to-face [facility], [place] ([id number]) encounter requirements with this patient on this date. The encounter with the patient was in whole or in part for the following MEDICAL CONDITION: (primary reason for [place]) MEDICAL NECESSITY: I certify, that based on my findings, NURSING services are a medically necessary home health service. HOME BOUND STATUS: I certify that my clinical findings support that this patient is homebound (i.e., Due to illness or injury, pt requires aid of supportive devices such as crutches, cane, wheelchairs, walkers, the use of special transportation or the assistance of another person to leave their place of residence. There is a normal inability to leave the home and doing so requires considerable and taxing effort. Other absences are for medical reasons / religious services and are infrequent or of short duration when for other reasons). o If current dressing causes regression in wound condition, may D/C ordered dressing product/s and apply Normal Saline Moist Dressing daily until next [place] / Other MD appointment. [place] of regression in wound condition at [phone]. o Please direct any NON-WOUND related issues/requests for orders to patient's Primary Care Physician Electronic Signature(s) Signed: 02/07/2017 3:56:49 PM By: Christin Fudge MD,  FACS Signed: 02/07/2017 5:07:14 PM By: Alric Quan Entered By: Alric Quan on 02/07/2017 10:12:05 [place], [place] ([id number]) -------------------------------------------------------------------------------- Problem List Details Patient Name: Casey Gates Date of Service: 02/07/2017 9:45 AM Medical Record Number: [id number] Patient Account Number: [id number] Date of Birth/Sex: 03-04-1926 (83 y.o. Male) Treating RN: Ahmed Prima Primary Care Provider: Velna Hatchet Other Clinician: Referring Provider: Velna Hatchet Treating Provider/Extender: Frann Rider in Treatment: 2 Active Problems ICD-10 Encounter Code Description Active Date Diagnosis S81.812A Laceration without foreign body, left lower leg, initial 01/24/2017 Yes encounter I89.0 Lymphedema, not elsewhere classified 01/24/2017 Yes L97.222 Non-pressure chronic ulcer of left calf with fat layer 01/24/2017 Yes exposed Inactive  Problems Resolved Problems Electronic Signature(s) Signed: 02/07/2017 10:12:02 AM By: Christin Fudge MD, FACS Entered By: Christin Fudge on 02/07/2017 10:12:01 [place], [place] ([id number]) -------------------------------------------------------------------------------- Progress Note Details Patient Name: Casey Gates Date of Service: 02/07/2017 9:45 AM Medical Record Number: [id number] Patient Account Number: [id number] Date of Birth/Sex: August 01, 1926 (83 y.o. Male) Treating RN: Ahmed Prima Primary Care Provider: Velna Hatchet Other Clinician: Referring Provider: Velna Hatchet Treating Provider/Extender: Frann Rider in Treatment: 2 Subjective Chief Complaint Information obtained from Patient Patient seen for complaints of Non-Healing Wound to the left lower extremity which she's had for about a month. History of Present Illness (HPI) The following HPI elements were documented for the patient's wound: Location: anterior and posterior lateral left lower extremity Quality:  Patient reports experiencing a dull pain to affected area(s). Severity: Patient states wound (s) are getting better. Duration: Patient has had the wound for < 4 weeks prior to presenting for treatment Timing: Pain in wound is constant (hurts all the time) Context: The wound occurred when the patient his walker with his legs and had injuries to them Modifying Factors: Other treatment(s) tried include:as been seeing dermatology for management of his problems Associated Signs and Symptoms: Patient reports having increase swelling. A fairly healthy 83 year old gentleman whose chronic medical problems include lumbar degenerative disc disease, kyphoscoliosis, chronic pain syndrome, history of syncope, history of food impaction, prostate cancer treated via TURP in 1997, left eye blindness and stasis and edema in the lower extremities. he walks with the help of a walker and on 2 separate occasions has injured his legs more on the left side with a left anterior and left posterior lateral injuries. Is been seen by dermatology for problems with stasis ulceration and lymphedema in the past and this time around has been treated by them with local care and a couple of courses of antibiotics which include doxycycline and Bactrim. Also been applying Bactroban ointment locally. 01/31/2017 -- he's been having dressing changes 3 times a week but still has a lot of drainage. Objective Constitutional Pulse regular. Respirations normal and unlabored. Afebrile. [place] ([id number]) Vitals Time Taken: 9:50 AM, Height: 73 in, Weight: 151 lbs, BMI: 19.9, Temperature: 97.8 F, Pulse: 72 bpm, Respiratory Rate: 16 breaths/min, Blood Pressure: 114/60 mmHg. Eyes Nonicteric. Reactive to light. Ears, Nose, Mouth, and Throat Lips, teeth, and gums WNL.Marland Kitchen Moist mucosa without lesions. Neck supple and nontender. No palpable supraclavicular or cervical adenopathy. Normal sized without goiter. Respiratory WNL. No  retractions.. Breath sounds WNL, No rubs, rales, rhonchi, or wheeze.. Cardiovascular Heart rhythm and rate regular, no murmur or gallop.. Pedal Pulses WNL. No clubbing, cyanosis or edema. Lymphatic No adneopathy. No adenopathy. No adenopathy. Musculoskeletal Adexa without tenderness or enlargement.. Digits and nails w/o clubbing, cyanosis, infection, petechiae, ischemia, or inflammatory conditions.Marland Kitchen Psychiatric Judgement and insight Intact.. No evidence of depression, anxiety, or agitation.. General Notes: the lymphedema has gone down significantly and there is no evidence of any lymphorrhea or microperforations today. The left posterior lateral linear ulcer looks clean and has epithelialized well. Integumentary (Hair, Skin) No suspicious lesions. No crepitus or fluctuance. No peri-wound warmth or erythema. No masses.. Wound #2 status is Open. Original cause of wound was Trauma. The wound is located on the Left,Posterior Lower Leg. The wound measures 2cm length x 0.3cm width x 0.2cm depth; 0.471cm^2 area and 0.094cm^3 volume. There is Fat Layer (Subcutaneous Tissue) Exposed exposed. There is no tunneling or undermining noted. There is a large amount of serous drainage noted. The wound margin is flat  and intact. There is large (67-100%) red granulation within the wound bed. There is a small (1-33%) amount of necrotic tissue within the wound bed including Adherent Slough. The periwound skin appearance exhibited: Excoriation, Induration. The periwound skin appearance did not exhibit: Callus, Crepitus, Rash, Scarring, Dry/Scaly, Maceration, Atrophie Blanche, Cyanosis, Ecchymosis, Hemosiderin Staining, Mottled, Pallor, Rubor, Erythema. Periwound temperature was noted as No Abnormality. The periwound has tenderness on palpation. Assessment Gates, Casey ([id number]) Active Problems ICD-10 S81.812A - Laceration without foreign body, left lower leg, initial encounter I89.0 - Lymphedema, not  elsewhere classified L97.222 - Non-pressure chronic ulcer of left calf with fat layer exposed Plan Wound Cleansing: Wound #2 Left,Posterior Lower Leg: Clean wound with Normal Saline. Anesthetic: Wound #2 Left,Posterior Lower Leg: Topical Lidocaine 4% cream applied to wound bed prior to debridement - in clinic only Primary Wound Dressing: Wound #2 Left,Posterior Lower Leg: Aquacel Ag Secondary Dressing: Wound #2 Left,Posterior Lower Leg: ABD and Kerlix/Conform Dressing Change Frequency: Wound #2 Left,Posterior Lower Leg: Change Dressing Monday, Wednesday, Friday Follow-up Appointments: Wound #2 Left,Posterior Lower Leg: Return Appointment in 1 week. Edema Control: Wound #2 Left,Posterior Lower Leg: Elevate legs to the level of the heart and pump ankles as often as possible Other: - pt to wear own compression stockings Home Health: Wound #2 Left,Posterior Lower Leg: Continue Home Health Visits - [place] Nurse may visit PRN to address patient s wound care needs. FACE TO FACE ENCOUNTER: MEDICARE and MEDICAID PATIENTS: I certify that this patient is under my care and that I had a face-to-face encounter that meets the physician face-to-face encounter requirements with this patient on this date. The encounter with the patient was in whole or in part for the following MEDICAL CONDITION: (primary reason for [place]) MEDICAL NECESSITY: I certify, that based on my findings, NURSING services are a medically necessary home health service. HOME BOUND STATUS: I certify that my clinical findings support that this patient is homebound (i.e., Due to illness or injury, pt requires aid of supportive devices such as crutches, cane, wheelchairs, walkers, the use of special transportation or the assistance of another person to leave their place of residence. There is a normal inability to leave the home and doing so requires considerable and taxing effort. Other absences are [place],  [place] ([id number]) for medical reasons / religious services and are infrequent or of short duration when for other reasons). If current dressing causes regression in wound condition, may D/C ordered dressing product/s and apply Normal Saline Moist Dressing daily until next [place] / Other MD appointment. [place] of regression in wound condition at [phone]. Please direct any NON-WOUND related issues/requests for orders to patient's Primary Care Physician after review I have recommended: 1. Silver collagen to be applied and changed every other day. a light Kerlix bandage to hold this in place. 2. elevation and exercise as much possible 3. Seen back in the weeks' time at our office -- I anticipate discharge soon 4. If discussed adequate protein, vitamin A, vitamin C and zinc Electronic Signature(s) Signed: 02/07/2017 10:14:03 AM By: Christin Fudge MD, FACS Entered By: Christin Fudge on 02/07/2017 10:14:02 [place], Casey Gates ([id number]) -------------------------------------------------------------------------------- SuperBill Details Patient Name: Casey Gates Date of Service: 02/07/2017 Medical Record Number: [id number] Patient Account Number: [id number] Date of Birth/Sex: 07-Sep-1926 (84 y.o. Male) Treating RN: Ahmed Prima Primary Care Provider: Velna Hatchet Other Clinician: Referring Provider: Velna Hatchet Treating Provider/Extender: Christin Fudge Service Line: Outpatient Weeks in Treatment: 2 Diagnosis Coding ICD-10 Codes Code Description  G83.662H Laceration without foreign body, left lower leg, initial encounter I89.0 Lymphedema, not elsewhere classified L97.222 Non-pressure chronic ulcer of left calf with fat layer exposed Facility Procedures CPT4 Code: [id number] Description: 99213 - WOUND CARE VISIT-LEV 3 EST PT Modifier: Quantity: 1 Physician Procedures CPT4 Code Description: [id number] 99213 - WC PHYS LEVEL 3 - EST PT ICD-10 Description  Diagnosis S81.812A Laceration without foreign body, left lower leg, ini I89.0 Lymphedema, not elsewhere classified L97.222 Non-pressure chronic ulcer of left calf with fat  lay Modifier: tial encounter er exposed Quantity: 1 Electronic Signature(s) Signed: 02/07/2017 3:56:49 PM By: Christin Fudge MD, FACS Signed: 02/07/2017 5:07:14 PM By: Alric Quan Previous Signature: 02/07/2017 10:14:16 AM Version By: Christin Fudge MD, FACS Entered By: Alric Quan on 02/07/2017 11:39:10

## 2017-02-08 NOTE — Progress Notes (Signed)
[place], [place] ([id number]) Visit Report for 02/07/2017 Arrival Information Details Patient Name: Casey Gates, Casey Gates Date of Service: 02/07/2017 9:45 AM Medical Record Number: [id number] Patient Account Number: [id number] Date of Birth/Sex: 1926/04/22 (83 y.o. Male) Treating RN: Ahmed Prima Primary Care Herberth Deharo: Velna Hatchet Other Clinician: Referring Jaydalyn Demattia: Velna Hatchet Treating Bonney Berres/Extender: Frann Rider in Treatment: 2 Visit Information History Since Last Visit All ordered tests and consults were completed: No Patient Arrived: [name] Added or deleted any medications: No Arrival Time: 09:49 Any new allergies or adverse reactions: No Accompanied By: son Had a fall or experienced change in No Transfer Assistance: EasyPivot activities of daily living that may affect Patient Lift risk of falls: Patient Identification Verified: Yes Signs or symptoms of abuse/neglect since last No Secondary Verification Process Yes visito Completed: Hospitalized since last visit: No Patient Requires Transmission- No Has Dressing in Place as Prescribed: Yes Based Precautions: Pain Present Now: No Patient Has Alerts: Yes Patient Alerts: NOT Diabetic Electronic Signature(s) Signed: 02/07/2017 5:07:14 PM By: Alric Quan Entered By: Alric Quan on 02/07/2017 09:49:58 [place], [place] ([id number]) -------------------------------------------------------------------------------- Clinic Level of Care Assessment Details Patient Name: Casey Gates Date of Service: 02/07/2017 9:45 AM Medical Record Number: [id number] Patient Account Number: [id number] Date of Birth/Sex: 08/29/1926 (83 y.o. Male) Treating RN: Ahmed Prima Primary Care Dan Dissinger: Velna Hatchet Other Clinician: Referring Ariaunna Longsworth: Velna Hatchet Treating Yaretzy Olazabal/Extender: Frann Rider in Treatment: 2 Clinic Level of Care Assessment Items TOOL 4 Quantity Score X - Use when only an EandM is performed on  FOLLOW-UP visit 1 0 ASSESSMENTS - Nursing Assessment / Reassessment X - Reassessment of Co-morbidities (includes updates in patient status) 1 10 X - Reassessment of Adherence to Treatment Plan 1 5 ASSESSMENTS - Wound and Skin Assessment / Reassessment X - Simple Wound Assessment / Reassessment - one wound 1 5 []  - Complex Wound Assessment / Reassessment - multiple wounds 0 []  - Dermatologic / Skin Assessment (not related to wound area) 0 ASSESSMENTS - Focused Assessment []  - Circumferential Edema Measurements - multi extremities 0 []  - Nutritional Assessment / Counseling / Intervention 0 []  - Lower Extremity Assessment (monofilament, tuning fork, pulses) 0 []  - Peripheral Arterial Disease Assessment (using hand held doppler) 0 ASSESSMENTS - Ostomy and/or Continence Assessment and Care []  - Incontinence Assessment and Management 0 []  - Ostomy Care Assessment and Management (repouching, etc.) 0 PROCESS - Coordination of Care []  - Simple Patient / Family Education for ongoing care 0 X - Complex (extensive) Patient / Family Education for ongoing care 1 20 X - Staff obtains Programmer, systems, Records, Test Results / Process Orders 1 10 X - Staff telephones HHA, Nursing Homes / Clarify orders / etc 1 10 []  - Routine Transfer to another Facility (non-emergent condition) 0 [place], [place] ([id number]) []  - Routine Hospital Admission (non-emergent condition) 0 []  - New Admissions / Biomedical engineer / Ordering NPWT, Apligraf, etc. 0 []  - Emergency Hospital Admission (emergent condition) 0 X - Simple Discharge Coordination 1 10 []  - Complex (extensive) Discharge Coordination 0 PROCESS - Special Needs []  - Pediatric / Minor Patient Management 0 []  - Isolation Patient Management 0 []  - Hearing / Language / Visual special needs 0 []  - Assessment of Community assistance (transportation, D/C planning, etc.) 0 []  - Additional assistance / Altered mentation 0 []  - Support Surface(s) Assessment (bed,  cushion, seat, etc.) 0 INTERVENTIONS - Wound Cleansing / Measurement X - Simple Wound Cleansing - one wound 1 5 []  - Complex Wound Cleansing - multiple wounds 0 X -  Wound Imaging (photographs - any number of wounds) 1 5 []  - Wound Tracing (instead of photographs) 0 X - Simple Wound Measurement - one wound 1 5 []  - Complex Wound Measurement - multiple wounds 0 INTERVENTIONS - Wound Dressings []  - Small Wound Dressing one or multiple wounds 0 X - Medium Wound Dressing one or multiple wounds 1 15 []  - Large Wound Dressing one or multiple wounds 0 X - Application of Medications - topical 1 5 []  - Application of Medications - injection 0 INTERVENTIONS - Miscellaneous []  - External ear exam 0 [place], [place] ([id number]) []  - Specimen Collection (cultures, biopsies, blood, body fluids, etc.) 0 []  - Specimen(s) / Culture(s) sent or taken to Lab for analysis 0 []  - Patient Transfer (multiple staff / Civil Service fast streamer / Similar devices) 0 []  - Simple Staple / Suture removal (25 or less) 0 []  - Complex Staple / Suture removal (26 or more) 0 []  - Hypo / Hyperglycemic Management (close monitor of Blood Glucose) 0 []  - Ankle / Brachial Index (ABI) - do not check if billed separately 0 X - Vital Signs 1 5 Has the patient been seen at the hospital within the last three years: Yes Total Score: 110 Level Of Care: New/Established - Level 3 Electronic Signature(s) Signed: 02/07/2017 5:07:14 PM By: Alric Quan Entered By: Alric Quan on 02/07/2017 11:39:03 [place], [place] ([id number]) -------------------------------------------------------------------------------- Encounter Discharge Information Details Patient Name: Casey Gates Date of Service: 02/07/2017 9:45 AM Medical Record Number: [id number] Patient Account Number: [id number] Date of Birth/Sex: 09/01/1926 (83 y.o. Male) Treating RN: Ahmed Prima Primary Care Zenab Gronewold: Velna Hatchet Other Clinician: Referring Dajuana Palen: Velna Hatchet Treating Finnley Lewis/Extender: Frann Rider in Treatment: 2 Encounter Discharge Information Items Discharge Pain Level: 0 Discharge Condition: Stable Ambulatory Status: Walker Discharge Destination: Home Transportation: Private Auto Accompanied By: son Schedule Follow-up Appointment: Yes Medication Reconciliation completed No and provided to Patient/Care Tevis [name]: Provided on Clinical Summary of Care: 02/07/2017 Form Type Recipient Paper Patient MR Electronic Signature(s) Signed: 02/07/2017 10:13:37 AM By: Ruthine Dose Entered By: Ruthine Dose on 02/07/2017 10:13:37 [place], [place] ([id number]) -------------------------------------------------------------------------------- Lower Extremity Assessment Details Patient Name: Casey Gates Date of Service: 02/07/2017 9:45 AM Medical Record Number: [id number] Patient Account Number: [id number] Date of Birth/Sex: 1926/08/31 (83 y.o. Male) Treating RN: Ahmed Prima Primary Care Beck Cofer: Velna Hatchet Other Clinician: Referring Aemilia Dedrick: Velna Hatchet Treating Justin Buechner/Extender: Frann Rider in Treatment: 2 Vascular Assessment Pulses: Dorsalis Pedis Palpable: [Left:Yes] Posterior Tibial Extremity colors, hair growth, and conditions: Extremity Color: [Left:Mottled] Temperature of Extremity: [Left:Warm] Capillary Refill: [Left:< 3 seconds] Electronic Signature(s) Signed: 02/07/2017 5:07:14 PM By: Alric Quan Entered By: Alric Quan on 02/07/2017 09:59:44 [place], [place] ([id number]) -------------------------------------------------------------------------------- Multi Wound Chart Details Patient Name: Casey Gates Date of Service: 02/07/2017 9:45 AM Medical Record Number: [id number] Patient Account Number: [id number] Date of Birth/Sex: 03-21-1926 (83 y.o. Male) Treating RN: Ahmed Prima Primary Care Carinne Brandenburger: Velna Hatchet Other Clinician: Referring Briannie Gutierrez: Velna Hatchet Treating  Muhannad Bignell/Extender: Frann Rider in Treatment: 2 Vital Signs Height(in): 73 Pulse(bpm): 72 Weight(lbs): 151 Blood Pressure 114/60 (mmHg): Body Mass Index(BMI): 20 Temperature(F): 97.8 Respiratory Rate 16 (breaths/min): Photos: [2:No Photos] [N/A:N/A] Wound Location: [2:Left Lower Leg - Posterior] [N/A:N/A] Wounding Event: [2:Trauma] [N/A:N/A] Primary Etiology: [2:Trauma, Other] [N/A:N/A] Date Acquired: [2:01/21/2017] [N/A:N/A] Weeks of Treatment: [2:2] [N/A:N/A] Wound Status: [2:Open] [N/A:N/A] Measurements L x W x D 2x0.3x0.2 [N/A:N/A] (cm) Area (cm) : [2:0.471] [N/A:N/A] Volume (cm) : [2:0.094] [N/A:N/A] % Reduction in Area: [2:89.30%] [N/A:N/A] % Reduction in Volume: 94.70% [N/A:N/A] Classification: [2:Full  Thickness Without Exposed Support Structures] [N/A:N/A] Exudate Amount: [2:Large] [N/A:N/A] Exudate Type: [2:Serous] [N/A:N/A] Exudate Color: [2:amber] [N/A:N/A] Wound Margin: [2:Flat and Intact] [N/A:N/A] Granulation Amount: [2:Large (67-100%)] [N/A:N/A] Granulation Quality: [2:Red] [N/A:N/A] Necrotic Amount: [2:Small (1-33%)] [N/A:N/A] Exposed Structures: [2:Fat Layer (Subcutaneous Tissue) Exposed: Yes Fascia: No Tendon: No Muscle: No Joint: No Bone: No] [N/A:N/A] Epithelialization: None N/A N/A Periwound Skin Texture: Excoriation: Yes N/A N/A Induration: Yes Callus: No Crepitus: No Rash: No Scarring: No Periwound Skin Maceration: No N/A N/A Moisture: Dry/Scaly: No Periwound Skin Color: Atrophie Blanche: No N/A N/A Cyanosis: No Ecchymosis: No Erythema: No Hemosiderin Staining: No Mottled: No Pallor: No Rubor: No Temperature: No Abnormality N/A N/A Tenderness on Yes N/A N/A Palpation: Wound Preparation: Ulcer Cleansing: N/A N/A Rinsed/Irrigated with Saline Topical Anesthetic Applied: Other: lidocaine 4% Treatment Notes Electronic Signature(s) Signed: 02/07/2017 10:12:07 AM By: Christin Fudge MD, FACS Entered By: Christin Fudge on  02/07/2017 10:12:07 [place], Keturah [place] ([id number]) -------------------------------------------------------------------------------- [place] Details Patient Name: Casey Gates Date of Service: 02/07/2017 9:45 AM Medical Record Number: [id number] Patient Account Number: [id number] Date of Birth/Sex: 01/03/1926 (83 y.o. Male) Treating RN: Ahmed Prima Primary Care Harutyun Monteverde: Velna Hatchet Other Clinician: Referring Morrigan Wickens: Velna Hatchet Treating Daielle Melcher/Extender: Frann Rider in Treatment: 2 Active Inactive ` Abuse / Safety / Falls / Self Care Management Nursing Diagnoses: Impaired physical mobility Potential for falls Goals: Patient will remain injury free Date Initiated: 01/24/2017 Target Resolution Date: 02/24/2017 Goal Status: Active Interventions: Assess fall risk on admission and as needed Treatment Activities: Patient referred to home care : 01/24/2017 Notes: ` Orientation to the Wound Care Program Nursing Diagnoses: Knowledge deficit related to the wound healing center program Goals: Patient/caregiver will verbalize understanding of the [place] Program Date Initiated: 01/24/2017 Target Resolution Date: 02/24/2017 Goal Status: Active Interventions: Provide education on orientation to the wound center Notes: ` Soft Tissue Infection [place], [place] ([id number]) Nursing Diagnoses: Impaired tissue integrity Potential for infection: soft tissue Goals: Patient will remain free of wound infection Date Initiated: 01/24/2017 Target Resolution Date: 02/24/2017 Goal Status: Active Interventions: Assess signs and symptoms of infection every visit Treatment Activities: Systemic antibiotics : 01/24/2017 Notes: ` Wound/Skin Impairment Nursing Diagnoses: Impaired tissue integrity Goals: Ulcer/skin breakdown will have a volume reduction of 30% by week 4 Date Initiated: 01/24/2017 Target Resolution Date: 02/14/2017 Goal Status:  Active Interventions: Assess patient/caregiver ability to obtain necessary supplies Treatment Activities: Patient referred to home care : 01/24/2017 Notes: Electronic Signature(s) Signed: 02/07/2017 5:07:14 PM By: Alric Quan Entered By: Alric Quan on 02/07/2017 09:59:47 [place], [place] ([id number]) -------------------------------------------------------------------------------- Pain Assessment Details Patient Name: Casey Gates Date of Service: 02/07/2017 9:45 AM Medical Record Number: [id number] Patient Account Number: [id number] Date of Birth/Sex: [birth year] (83 y.o. Male) Treating RN: Ahmed Prima Primary Care Taniqua Issa: Velna Hatchet Other Clinician: Referring Saje Gallop: Velna Hatchet Treating Dannell Raczkowski/Extender: Frann Rider in Treatment: 2 Active Problems Location of Pain Severity and Description of Pain Patient Has Paino No Site Locations With Dressing Change: No Pain Management and Medication Current Pain Management: Electronic Signature(s) Signed: 02/07/2017 5:07:14 PM By: Alric Quan Entered By: Alric Quan on 02/07/2017 09:50:09 [place], [place] ([id number]) -------------------------------------------------------------------------------- Patient/Caregiver Education Details Patient Name: Casey Gates Date of Service: 02/07/2017 9:45 AM Medical Record Number: [id number] Patient Account Number: [id number] Date of Birth/Gender: 06/25/1926 (83 y.o. Male) Treating RN: Ahmed Prima Primary Care Physician: Velna Hatchet Other Clinician: Referring Physician: Velna Hatchet Treating Physician/Extender: Frann Rider in Treatment: 2 Education Assessment Education Provided To: Patient Education Topics Provided Wound/Skin Impairment: Handouts: Other:  change dressing as ordered Methods: Demonstration, Explain/Verbal Responses: State content correctly Electronic Signature(s) Signed: 02/07/2017 5:07:14 PM By: Alric Quan Entered By:  Alric Quan on 02/07/2017 10:04:13 [place], [place] ([id number]) -------------------------------------------------------------------------------- Wound Assessment Details Patient Name: Casey Gates Date of Service: 02/07/2017 9:45 AM Medical Record Number: [id number] Patient Account Number: [id number] Date of Birth/Sex: 10-19-1926 (83 y.o. Male) Treating RN: Ahmed Prima Primary Care Pao Haffey: Velna Hatchet Other Clinician: Referring Rahmel Nedved: Velna Hatchet Treating Austan Nicholl/Extender: Frann Rider in Treatment: 2 Wound Status Wound Number: 2 Primary Etiology: Trauma, Other Wound Location: Left Lower Leg - Posterior Wound Status: Open Wounding Event: Trauma Date Acquired: 01/21/2017 Weeks Of Treatment: 2 Clustered Wound: No Photos Photo Uploaded By: Alric Quan on 02/07/2017 11:41:28 Wound Measurements Length: (cm) 2 Width: (cm) 0.3 Depth: (cm) 0.2 Area: (cm) 0.471 Volume: (cm) 0.094 % Reduction in Area: 89.3% % Reduction in Volume: 94.7% Epithelialization: None Tunneling: No Undermining: No Wound Description Full Thickness Without Exposed Classification: Support Structures Wound Margin: Flat and Intact Exudate Large Amount: Exudate Type: Serous Exudate Color: amber Foul Odor After Cleansing: No Slough/Fibrino Yes Wound Bed Granulation Amount: Large (67-100%) Exposed Structure Granulation Quality: Red Fascia Exposed: No Necrotic Amount: Small (1-33%) Fat Layer (Subcutaneous Tissue) Exposed: Yes [place], [place] ([id number]) Necrotic Quality: Adherent Slough Tendon Exposed: No Muscle Exposed: No Joint Exposed: No Bone Exposed: No Periwound Skin Texture Texture Color No Abnormalities Noted: No No Abnormalities Noted: No Callus: No Atrophie Blanche: No Crepitus: No Cyanosis: No Excoriation: Yes Ecchymosis: No Induration: Yes Erythema: No Rash: No Hemosiderin Staining: No Scarring: No Mottled: No Pallor: No Moisture Rubor:  No No Abnormalities Noted: No Dry / Scaly: No Temperature / Pain Maceration: No Temperature: No Abnormality Tenderness on Palpation: Yes Wound Preparation Ulcer Cleansing: Rinsed/Irrigated with Saline Topical Anesthetic Applied: Other: lidocaine 4%, Treatment Notes Wound #2 (Left, Posterior Lower Leg) 1. Cleansed with: Clean wound with Normal Saline 2. Anesthetic Topical Lidocaine 4% cream to wound bed prior to debridement 4. Dressing Applied: Prisma Ag 5. Secondary Dressing Applied ABD Pad Kerlix/Conform 7. Secured with Recruitment consultant) Signed: 02/07/2017 5:07:14 PM By: Alric Quan Entered By: Alric Quan on 02/07/2017 09:59:13 [place], [place] ([id number]) -------------------------------------------------------------------------------- [place] Details Patient Name: Casey Gates Date of Service: 02/07/2017 9:45 AM Medical Record Number: [id number] Patient Account Number: [id number] Date of Birth/Sex: 12-24-1925 (83 y.o. Male) Treating RN: Ahmed Prima Primary Care Shivank Pinedo: Velna Hatchet Other Clinician: Referring Sterlin Knightly: Velna Hatchet Treating Safal Halderman/Extender: Frann Rider in Treatment: 2 Vital Signs Time Taken: 09:50 Temperature (F): 97.8 Height (in): 73 Pulse (bpm): 72 Weight (lbs): 151 Respiratory Rate (breaths/min): 16 Body Mass Index (BMI): 19.9 Blood Pressure (mmHg): 114/60 Reference Range: 80 - 120 mg / dl Electronic Signature(s) Signed: 02/07/2017 5:07:14 PM By: Alric Quan Entered By: Alric Quan on 02/07/2017 09:52:04

## 2017-02-12 DIAGNOSIS — G894 Chronic pain syndrome: Secondary | ICD-10-CM | POA: Diagnosis not present

## 2017-02-12 DIAGNOSIS — M5136 Other intervertebral disc degeneration, lumbar region: Secondary | ICD-10-CM | POA: Diagnosis not present

## 2017-02-12 DIAGNOSIS — M48061 Spinal stenosis, lumbar region without neurogenic claudication: Secondary | ICD-10-CM | POA: Diagnosis not present

## 2017-02-12 DIAGNOSIS — M25562 Pain in left knee: Secondary | ICD-10-CM | POA: Diagnosis not present

## 2017-02-14 ENCOUNTER — Encounter: Payer: Medicare Other | Admitting: Surgery

## 2017-02-14 DIAGNOSIS — L97222 Non-pressure chronic ulcer of left calf with fat layer exposed: Secondary | ICD-10-CM | POA: Diagnosis not present

## 2017-02-15 NOTE — Progress Notes (Signed)
JEVONTE, CLANTON ([id number]) Visit Report for 02/14/2017 Chief Complaint Document Details Patient Name: Casey Gates, Casey Gates Date of Service: 02/14/2017 11:15 AM Medical Record Number: [id number] Patient Account Number: [id number] Date of Birth/Sex: [birth year] (83 y.o. Male) Treating RN: Cornell Barman Primary Care Provider: Velna Hatchet Other Clinician: Referring Provider: Velna Hatchet Treating Provider/Extender: Frann Rider in Treatment: 3 Information Obtained from: Patient Chief Complaint Patient seen for complaints of Non-Healing Wound to the left lower extremity which she's had for about a month. Electronic Signature(s) Signed: 02/14/2017 12:09:49 PM By: Christin Fudge MD, FACS Entered By: Christin Fudge on 02/14/2017 12:09:48 [place], Keturah Barre ([id number]) -------------------------------------------------------------------------------- HPI Details Patient Name: Casey Gates Date of Service: 02/14/2017 11:15 AM Medical Record Number: [id number] Patient Account Number: [id number] Date of Birth/Sex: [birth year] (83 y.o. Male) Treating RN: Cornell Barman Primary Care Provider: Velna Hatchet Other Clinician: Referring Provider: Velna Hatchet Treating Provider/Extender: Frann Rider in Treatment: 3 History of Present Illness Location: anterior and posterior lateral left lower extremity Quality: Patient reports experiencing a dull pain to affected area(s). Severity: Patient states wound (s) are getting better. Duration: Patient has had the wound for < 4 weeks prior to presenting for treatment Timing: Pain in wound is constant (hurts all the time) Context: The wound occurred when the patient his walker with his legs and had injuries to them Modifying Factors: Other treatment(s) tried include:as been seeing dermatology for management of his problems Associated Signs and Symptoms: Patient reports having increase swelling. HPI Description: A fairly healthy 83 year old gentleman whose  chronic medical problems include lumbar degenerative disc disease, kyphoscoliosis, chronic pain syndrome, history of syncope, history of food impaction, prostate cancer treated via TURP in 1997, left eye blindness and stasis and edema in the lower extremities. he walks with the help of a walker and on 2 separate occasions has injured his legs more on the left side with a left anterior and left posterior lateral injuries. Is been seen by dermatology for problems with stasis ulceration and lymphedema in the past and this time around has been treated by them with local care and a couple of courses of antibiotics which include doxycycline and Bactrim. Also been applying Bactroban ointment locally. 01/31/2017 -- he's been having dressing changes 3 times a week but still has a lot of drainage. Electronic Signature(s) Signed: 02/14/2017 12:09:52 PM By: Christin Fudge MD, FACS Entered By: Christin Fudge on 02/14/2017 12:09:52 [place], Keturah Barre ([id number]) -------------------------------------------------------------------------------- Physical Exam Details Patient Name: Casey Gates Date of Service: 02/14/2017 11:15 AM Medical Record Number: [id number] Patient Account Number: [id number] Date of Birth/Sex: May 18, 1926 (83 y.o. Male) Treating RN: Cornell Barman Primary Care Provider: Velna Hatchet Other Clinician: Referring Provider: Velna Hatchet Treating Provider/Extender: Frann Rider in Treatment: 3 Constitutional . Pulse regular. Respirations normal and unlabored. Afebrile. . Eyes Nonicteric. Reactive to light. Ears, Nose, Mouth, and Throat Lips, teeth, and gums WNL.Marland Kitchen Moist mucosa without lesions. Neck supple and nontender. No palpable supraclavicular or cervical adenopathy. Normal sized without goiter. Respiratory WNL. No retractions.. Cardiovascular Pedal Pulses WNL. No clubbing, cyanosis or edema. Lymphatic No adneopathy. No adenopathy. No adenopathy. Musculoskeletal Adexa  without tenderness or enlargement.. Digits and nails w/o clubbing, cyanosis, infection, petechiae, ischemia, or inflammatory conditions.. Integumentary (Hair, Skin) No suspicious lesions. No crepitus or fluctuance. No peri-wound warmth or erythema. No masses.Marland Kitchen Psychiatric Judgement and insight Intact.. No evidence of depression, anxiety, or agitation.. Notes the wound has healed and his lymphedema is looking much better Electronic Signature(s) Signed: 02/14/2017 12:10:11 PM By: Christin Fudge MD, FACS Entered By:  Christin Fudge on 02/14/2017 12:10:11 [place], [place] ([id number]) -------------------------------------------------------------------------------- Physician Orders Details Patient Name: Casey Gates, Casey Gates Date of Service: 02/14/2017 11:15 AM Medical Record Number: [id number] Patient Account Number: [id number] Date of Birth/Sex: 02-13-1926 (83 y.o. Male) Treating RN: Cornell Barman Primary Care Provider: Velna Hatchet Other Clinician: Referring Provider: Velna Hatchet Treating Provider/Extender: Frann Rider in Treatment: 3 Verbal / Phone Orders: No Diagnosis Coding Discharge From [facility] Services o Discharge from [place] - treatment complete Electronic Signature(s) Signed: 02/14/2017 1:47:27 PM By: Christin Fudge MD, FACS Signed: 02/14/2017 4:59:14 PM By: Gretta Cool RN, BSN, Kim RN, BSN Entered By: Gretta Cool, RN, BSN, Kim on 02/14/2017 12:00:31 [place], [place] ([id number]) -------------------------------------------------------------------------------- Problem List Details Patient Name: Casey Gates Date of Service: 02/14/2017 11:15 AM Medical Record Number: [id number] Patient Account Number: [id number] Date of Birth/Sex: Aug 13, 1926 (83 y.o. Male) Treating RN: Cornell Barman Primary Care Provider: Velna Hatchet Other Clinician: Referring Provider: Velna Hatchet Treating Provider/Extender: Frann Rider in Treatment: 3 Active Problems ICD-10 Encounter Code  Description Active Date Diagnosis S81.812A Laceration without foreign body, left lower leg, initial 01/24/2017 Yes encounter I89.0 Lymphedema, not elsewhere classified 01/24/2017 Yes L97.222 Non-pressure chronic ulcer of left calf with fat layer 01/24/2017 Yes exposed Inactive Problems Resolved Problems Electronic Signature(s) Signed: 02/14/2017 12:09:40 PM By: Christin Fudge MD, FACS Entered By: Christin Fudge on 02/14/2017 12:09:40 [place], [place] ([id number]) -------------------------------------------------------------------------------- Progress Note Details Patient Name: Casey Gates Date of Service: 02/14/2017 11:15 AM Medical Record Number: [id number] Patient Account Number: [id number] Date of Birth/Sex: 02-19-1926 (83 y.o. Male) Treating RN: Cornell Barman Primary Care Provider: Velna Hatchet Other Clinician: Referring Provider: Velna Hatchet Treating Provider/Extender: Frann Rider in Treatment: 3 Subjective Chief Complaint Information obtained from Patient Patient seen for complaints of Non-Healing Wound to the left lower extremity which she's had for about a month. History of Present Illness (HPI) The following HPI elements were documented for the patient's wound: Location: anterior and posterior lateral left lower extremity Quality: Patient reports experiencing a dull pain to affected area(s). Severity: Patient states wound (s) are getting better. Duration: Patient has had the wound for < 4 weeks prior to presenting for treatment Timing: Pain in wound is constant (hurts all the time) Context: The wound occurred when the patient his walker with his legs and had injuries to them Modifying Factors: Other treatment(s) tried include:as been seeing dermatology for management of his problems Associated Signs and Symptoms: Patient reports having increase swelling. A fairly healthy 83 year old gentleman whose chronic medical problems include lumbar degenerative  disc disease, kyphoscoliosis, chronic pain syndrome, history of syncope, history of food impaction, prostate cancer treated via TURP in 1997, left eye blindness and stasis and edema in the lower extremities. he walks with the help of a walker and on 2 separate occasions has injured his legs more on the left side with a left anterior and left posterior lateral injuries. Is been seen by dermatology for problems with stasis ulceration and lymphedema in the past and this time around has been treated by them with local care and a couple of courses of antibiotics which include doxycycline and Bactrim. Also been applying Bactroban ointment locally. 01/31/2017 -- he's been having dressing changes 3 times a week but still has a lot of drainage. Objective Constitutional Pulse regular. Respirations normal and unlabored. Afebrile. [place] ([id number]) Vitals Time Taken: 11:49 AM, Height: 73 in, Weight: 151 lbs, BMI: 19.9, Temperature: 98.6 F, Pulse: 67 bpm, Respiratory Rate: 16 breaths/min, Blood Pressure: 110/52 mmHg. Eyes Nonicteric. Reactive to light. Ears,  Nose, Mouth, and Throat Lips, teeth, and gums WNL.Marland Kitchen Moist mucosa without lesions. Neck supple and nontender. No palpable supraclavicular or cervical adenopathy. Normal sized without goiter. Respiratory WNL. No retractions.. Cardiovascular Pedal Pulses WNL. No clubbing, cyanosis or edema. Lymphatic No adneopathy. No adenopathy. No adenopathy. Musculoskeletal Adexa without tenderness or enlargement.. Digits and nails w/o clubbing, cyanosis, infection, petechiae, ischemia, or inflammatory conditions.Marland Kitchen Psychiatric Judgement and insight Intact.. No evidence of depression, anxiety, or agitation.. General Notes: the wound has healed and his lymphedema is looking much better Integumentary (Hair, Skin) No suspicious lesions. No crepitus or fluctuance. No peri-wound warmth or erythema. No masses.. Wound #2 status is Healed - Epithelialized.  Original cause of wound was Trauma. The wound is located on the Left,Posterior Lower Leg. The wound measures 0cm length x 0cm width x 0cm depth; 0cm^2 area and 0cm^3 volume. The wound is limited to skin breakdown. There is no tunneling or undermining noted. There is a large amount of serous drainage noted. The wound margin is flat and intact. There is no granulation within the wound bed. There is no necrotic tissue within the wound bed. The periwound skin appearance had no abnormalities noted for texture. The periwound skin appearance had no abnormalities noted for moisture. The periwound skin appearance did not exhibit: Atrophie Blanche, Cyanosis, Ecchymosis, Hemosiderin Staining, Mottled, Pallor, Rubor, Erythema. Periwound temperature was noted as No Abnormality. The periwound has tenderness on palpation. 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 He will continue to wear his lymphedema compression stockings and he is discharged on the wound care services and will be seen back only if needed Electronic Signature(s) Signed: 02/14/2017 12:11:00 PM By: Christin Fudge MD, FACS Entered By: Christin Fudge on 02/14/2017 12:11:00 [place], [place] ([id number]) -------------------------------------------------------------------------------- SuperBill Details Patient Name: Casey Gates Date of Service: 02/14/2017 Medical Record Number: [id number] Patient Account Number: [id number] Date of Birth/Sex: Jan 28, 1926 (83 y.o. Male) Treating RN: Cornell Barman Primary Care Provider: Velna Hatchet Other  Clinician: Referring Provider: Velna Hatchet Treating Provider/Extender: Christin Fudge Service Line: Outpatient Weeks in Treatment: 3 Diagnosis Coding ICD-10 Codes Code Description [phone] Laceration without foreign body, left lower leg, initial encounter I89.0 Lymphedema, not elsewhere classified L97.222 Non-pressure chronic ulcer of left calf with fat layer exposed Physician Procedures CPT4 Code Description: [id number] - WC PHYS LEVEL 2 - EST PT ICD-10 Description Diagnosis S81.812A Laceration without foreign body, left lower leg, ini I89.0 Lymphedema, not elsewhere classified L97.222 Non-pressure chronic ulcer of left calf with fat  lay Modifier: tial encounter er exposed Quantity: 1 Electronic Signature(s) Signed: 02/14/2017 12:11:12 PM By: Christin Fudge MD, FACS Entered By: Christin Fudge on 02/14/2017 12:11:12

## 2017-02-15 NOTE — Progress Notes (Addendum)
[name], [name] Visit Report for 02/14/2017 Arrival Information Details Patient Name: Casey Gates Date of Service: 02/14/2017 11:15 AM Medical Record Number: [id number] Patient Account Number: [id number] Date of Birth/Sex: 1926/02/17 (83 y.o. Male) Treating RN: Cornell Barman Primary Care Naomee Nowland: Velna Hatchet Other Clinician: Referring Dilana Mcphie: Velna Hatchet Treating Shanese Riemenschneider/Extender: Frann Rider in Treatment: 3 Visit Information History Since Last Visit Added or deleted any medications: No Patient Arrived: [place] Any new allergies or adverse reactions: No Arrival Time: 11:48 Had a fall or experienced change in No Accompanied By: son activities of daily living that may affect Transfer Assistance: None risk of falls: Patient Identification Verified: Yes Signs or symptoms of abuse/neglect since last No Secondary Verification Process Yes visito Completed: Hospitalized since last visit: No Patient Requires Transmission-Based No Has Dressing in Place as Prescribed: Yes Precautions: Pain Present Now: No Patient Has Alerts: Yes Patient Alerts: NOT Diabetic Electronic Signature(s) Signed: 02/14/2017 4:59:14 PM By: Gretta Cool, RN, BSN, Kim RN, BSN Entered By: Gretta Cool, RN, BSN, Kim on 02/14/2017 11:48:55 Casey Gates ([id number]) -------------------------------------------------------------------------------- Clinic Level of Care Assessment Details Patient Name: Casey Gates Date of Service: 02/14/2017 11:15 AM Medical Record Number: [id number] Patient Account Number: [id number] Date of Birth/Sex: 06/24/1926 (83 y.o. Male) Treating RN: Cornell Barman Primary Care Isrrael Fluckiger: Velna Hatchet Other Clinician: Referring Sanna Porcaro: Velna Hatchet Treating Rumor Sun/Extender: Frann Rider in Treatment: 3 Clinic Level of Care Assessment Items TOOL 4 Quantity Score []  - Use when only an EandM is performed on FOLLOW-UP visit 0 ASSESSMENTS - Nursing Assessment /  Reassessment []  - Reassessment of Co-morbidities (includes updates in patient status) 0 X - Reassessment of Adherence to Treatment Plan 1 5 ASSESSMENTS - Wound and Skin Assessment / Reassessment X - Simple Wound Assessment / Reassessment - one wound 1 5 []  - Complex Wound Assessment / Reassessment - multiple wounds 0 []  - Dermatologic / Skin Assessment (not related to wound area) 0 ASSESSMENTS - Focused Assessment []  - Circumferential Edema Measurements - multi extremities 0 []  - Nutritional Assessment / Counseling / Intervention 0 []  - Lower Extremity Assessment (monofilament, tuning fork, pulses) 0 []  - Peripheral Arterial Disease Assessment (using hand held doppler) 0 ASSESSMENTS - Ostomy and/or Continence Assessment and Care []  - Incontinence Assessment and Management 0 []  - Ostomy Care Assessment and Management (repouching, etc.) 0 PROCESS - Coordination of Care X - Simple Patient / Family Education for ongoing care 1 15 []  - Complex (extensive) Patient / Family Education for ongoing care 0 []  - Staff obtains Programmer, systems, Records, Test Results / Process Orders 0 []  - Staff telephones HHA, Nursing Homes / Clarify orders / etc 0 []  - Routine Transfer to another Facility (non-emergent condition) 0 Casey Gates ([id number]) []  - Routine Hospital Admission (non-emergent condition) 0 []  - New Admissions / Biomedical engineer / Ordering NPWT, Apligraf, etc. 0 []  - Emergency Hospital Admission (emergent condition) 0 []  - Simple Discharge Coordination 0 []  - Complex (extensive) Discharge Coordination 0 PROCESS - Special Needs []  - Pediatric / Minor Patient Management 0 []  - Isolation Patient Management 0 []  - Hearing / Language / Visual special needs 0 []  - Assessment of Community assistance (transportation, D/C planning, etc.) 0 []  - Additional assistance / Altered mentation 0 []  - Support Surface(s) Assessment (bed, cushion, seat, etc.) 0 INTERVENTIONS - Wound Cleansing /  Measurement []  - Simple Wound Cleansing - one wound 0 []  - Complex Wound Cleansing - multiple wounds 0 X - Wound Imaging (photographs - any number of wounds) 1 5 []  -  Wound Tracing (instead of photographs) 0 []  - Simple Wound Measurement - one wound 0 []  - Complex Wound Measurement - multiple wounds 0 INTERVENTIONS - Wound Dressings []  - Small Wound Dressing one or multiple wounds 0 []  - Medium Wound Dressing one or multiple wounds 0 []  - Large Wound Dressing one or multiple wounds 0 []  - Application of Medications - topical 0 []  - Application of Medications - injection 0 INTERVENTIONS - Miscellaneous []  - External ear exam 0 Casey Gates ([id number]) []  - Specimen Collection (cultures, biopsies, blood, body fluids, etc.) 0 []  - Specimen(s) / Culture(s) sent or taken to Lab for analysis 0 []  - Patient Transfer (multiple staff / Harrel Lemon Lift / Similar devices) 0 []  - Simple Staple / Suture removal (25 or less) 0 []  - Complex Staple / Suture removal (26 or more) 0 []  - Hypo / Hyperglycemic Management (close monitor of Blood Glucose) 0 []  - Ankle / Brachial Index (ABI) - do not check if billed separately 0 X - Vital Signs 1 5 Has the patient been seen at the hospital within the last three years: Yes Total Score: 35 Level Of Care: New/Established - Level 1 Electronic Signature(s) Signed: 02/17/2017 5:09:44 PM By: Gretta Cool, RN, BSN, Kim RN, BSN Entered By: Gretta Cool, RN, BSN, Kim on 02/17/2017 12:35:48 Casey Gates ([id number]) -------------------------------------------------------------------------------- Encounter Discharge Information Details Patient Name: Casey Gates Date of Service: 02/14/2017 11:15 AM Medical Record Number: [id number] Patient Account Number: [id number] Date of Birth/Sex: [birth year] (83 y.o. Male) Treating RN: Cornell Barman Primary Care Laveda Demedeiros: Velna Hatchet Other Clinician: Referring Savonna Birchmeier: Velna Hatchet Treating [name] Lebeau/Extender: Frann Rider in  Treatment: 3 Encounter Discharge Information Items Schedule Follow-up Appointment: No Medication Reconciliation completed No and provided to Patient/Care Natalea Sutliff: Provided on Clinical Summary of Care: 02/14/2017 Form Type Recipient Paper Patient MR Electronic Signature(s) Signed: 02/14/2017 12:05:22 PM By: Ruthine Dose Entered By: Ruthine Dose on 02/14/2017 12:05:22 [place], [place] ([id number]) -------------------------------------------------------------------------------- Lower Extremity Assessment Details Patient Name: Casey Gates Date of Service: 02/14/2017 11:15 AM Medical Record Number: [id number] Patient Account Number: [id number] Date of Birth/Sex: 11-25-1926 (83 y.o. Male) Treating RN: Cornell Barman Primary Care Adaliz Dobis: Velna Hatchet Other Clinician: Referring Toshiyuki Fredell: Velna Hatchet Treating Zanna Hawn/Extender: Frann Rider in Treatment: 3 Edema Assessment Assessed: [Left: No] [Right: No] Edema: [Left: N] [Right: o] Vascular Assessment Claudication: Claudication Assessment [Left:None] Pulses: Dorsalis Pedis Palpable: [Left:Yes] Posterior Tibial Palpable: [Left:Yes] Extremity colors, hair growth, and conditions: Hair Growth on Extremity: [Left:No] Temperature of Extremity: [Left:Warm] Capillary Refill: [Left:< 3 seconds] Dependent Rubor: [Left:No] Blanched when Elevated: [Left:No] Lipodermatosclerosis: [Left:No] Toe Nail Assessment Left: Right: Thick: Yes Discolored: Yes Deformed: No Improper Length and Hygiene: No Electronic Signature(s) Signed: 02/14/2017 4:59:14 PM By: Gretta Cool, RN, BSN, Kim RN, BSN Entered By: Gretta Cool, RN, BSN, Kim on 02/14/2017 11:59:54 [place], [place] ([id number]) -------------------------------------------------------------------------------- Multi Wound Chart Details Patient Name: Casey Gates Date of Service: 02/14/2017 11:15 AM Medical Record Number: [id number] Patient Account Number: [id number] Date of Birth/Sex: 28-Jan-1926  (83 y.o. Male) Treating RN: Cornell Barman Primary Care Ranvir Renovato: Velna Hatchet Other Clinician: Referring Amayrany Cafaro: Velna Hatchet Treating Hikari Tripp/Extender: Frann Rider in Treatment: 3 Vital Signs Height(in): 73 Pulse(bpm): 67 Weight(lbs): 151 Blood Pressure 110/52 (mmHg): Body Mass Index(BMI): 20 Temperature(F): 98.6 Respiratory Rate 16 (breaths/min): Photos: [N/A:N/A] Wound Location: Left Lower Leg - Posterior N/A N/A Wounding Event: Trauma N/A N/A Primary Etiology: Trauma, Other N/A N/A Date Acquired: 01/21/2017 N/A N/A Weeks of Treatment: 3 N/A N/A Wound Status: Healed - Epithelialized N/A N/A Measurements L x W  x D 0x0x0 N/A N/A (cm) Area (cm) : 0 N/A N/A Volume (cm) : 0 N/A N/A % Reduction in Area: 100.00% N/A N/A % Reduction in Volume: 100.00% N/A N/A Classification: Full Thickness Without N/A N/A Exposed Support Structures Exudate Amount: Large N/A N/A Exudate Type: Serous N/A N/A Exudate Color: amber N/A N/A Wound Margin: Flat and Intact N/A N/A Granulation Amount: None Present (0%) N/A N/A Necrotic Amount: None Present (0%) N/A N/A Exposed Structures: Fascia: No N/A N/A Fat Layer (Subcutaneous Tissue) Exposed: No [place], Jostin ([id number]) Tendon: No Muscle: No Joint: No Bone: No Limited to Skin Breakdown Epithelialization: Large (67-100%) N/A N/A Periwound Skin Texture: Excoriation: Yes N/A N/A Induration: Yes Callus: No Crepitus: No Rash: No Scarring: No Periwound Skin Maceration: No N/A N/A Moisture: Dry/Scaly: No Periwound Skin Color: Atrophie Blanche: No N/A N/A Cyanosis: No Ecchymosis: No Erythema: No Hemosiderin Staining: No Mottled: No Pallor: No Rubor: No Temperature: No Abnormality N/A N/A Tenderness on Yes N/A N/A Palpation: Wound Preparation: Ulcer Cleansing: N/A N/A Rinsed/Irrigated with Saline Topical Anesthetic Applied: Other: lidocaine 4% Treatment Notes Electronic Signature(s) Signed: 02/14/2017  12:09:44 PM By: Christin Fudge MD, FACS Entered By: Christin Fudge on 02/14/2017 12:09:44 [place], [place] ([id number]) -------------------------------------------------------------------------------- [place] Details Patient Name: Casey Gates Date of Service: 02/14/2017 11:15 AM Medical Record Number: [id number] Patient Account Number: [id number] Date of Birth/Sex: 1925-12-31 (83 y.o. Male) Treating RN: Cornell Barman Primary Care Caspar Favila: Velna Hatchet Other Clinician: Referring Akshat Minehart: Velna Hatchet Treating Faduma Cho/Extender: Frann Rider in Treatment: 3 Active Inactive Electronic Signature(s) Signed: 02/18/2017 3:58:23 PM By: Gretta Cool, RN, BSN, Kim RN, BSN Previous Signature: 02/14/2017 4:59:14 PM Version By: Gretta Cool, RN, BSN, Kim RN, BSN Entered By: Gretta Cool, RN, BSN, Kim on 02/18/2017 15:58:22 [place], [place] ([id number]) -------------------------------------------------------------------------------- Pain Assessment Details Patient Name: Casey Gates Date of Service: 02/14/2017 11:15 AM Medical Record Number: [id number] Patient Account Number: [id number] Date of Birth/Sex: 1926-11-20 (83 y.o. Male) Treating RN: Cornell Barman Primary Care Raeden Belzer: Velna Hatchet Other Clinician: Referring Geneieve Duell: Velna Hatchet Treating Malik Paar/Extender: Frann Rider in Treatment: 3 Active Problems Location of Pain Severity and Description of Pain Patient Has Paino No Site Locations With Dressing Change: No Pain Management and Medication Current Pain Management: Electronic Signature(s) Signed: 02/14/2017 4:59:14 PM By: Gretta Cool, RN, BSN, Kim RN, BSN Entered By: Gretta Cool, RN, BSN, Kim on 02/14/2017 11:49:02 [place], [place] ([id number]) -------------------------------------------------------------------------------- Wound Assessment Details Patient Name: Casey Gates Date of Service: 02/14/2017 11:15 AM Medical Record Number: [id number] Patient Account Number:  [id number] Date of Birth/Sex: 1926/08/26 (83 y.o. Male) Treating RN: Cornell Barman Primary Care Dalbert Stillings: Velna Hatchet Other Clinician: Referring Lilliahna Schubring: Velna Hatchet Treating Azahel Belcastro/Extender: Frann Rider in Treatment: 3 Wound Status Wound Number: 2 Primary Etiology: Trauma, Other Wound Location: Left Lower Leg - Posterior Wound Status: Healed - Epithelialized Wounding Event: Trauma Date Acquired: 01/21/2017 Weeks Of Treatment: 3 Clustered Wound: No Photos Wound Measurements Length: (cm) 0 % Reduction Width: (cm) 0 % Reduction Depth: (cm) 0 Epithelializ Area: (cm) 0 Tunneling: Volume: (cm) 0 Undermining in Area: 100% in Volume: 100% ation: Large (67-100%) No : No Wound Description Full Thickness Without Exposed Foul Odor Af Classification: Support Structures Slough/Fibri Wound Margin: Flat and Intact Exudate Large Amount: Exudate Type: Serous Exudate Color: amber ter Cleansing: No no Yes Wound Bed Granulation Amount: None Present (0%) Exposed Structure Necrotic Amount: None Present (0%) Fascia Exposed: No Fat Layer (Subcutaneous Tissue) Exposed: No Tendon Exposed: No Muscle Exposed: No Joint Exposed: No [place], [place] ([id number]) Bone Exposed: No Limited  to Skin Breakdown Periwound Skin Texture Texture Color No Abnormalities Noted: Yes No Abnormalities Noted: No Atrophie Blanche: No Moisture Cyanosis: No No Abnormalities Noted: Yes Ecchymosis: No Erythema: No Hemosiderin Staining: No Mottled: No Pallor: No Rubor: No Temperature / Pain Temperature: No Abnormality Tenderness on Palpation: Yes Wound Preparation Ulcer Cleansing: Rinsed/Irrigated with Saline Topical Anesthetic Applied: Other: lidocaine 4%, Electronic Signature(s) Signed: 02/14/2017 4:59:14 PM By: Gretta Cool, RN, BSN, Kim RN, BSN Entered By: Gretta Cool, RN, BSN, Kim on 02/14/2017 11:59:18 [place], [place]  ([id number]) -------------------------------------------------------------------------------- [place] Details Patient Name: Casey Gates Date of Service: 02/14/2017 11:15 AM Medical Record Number: [id number] Patient Account Number: [id number] Date of Birth/Sex: [birth year] (83 y.o. Male) Treating RN: Cornell Barman Primary Care Brance Dartt: Velna Hatchet Other Clinician: Referring Leyland Kenna: Velna Hatchet Treating Emil Klassen/Extender: Frann Rider in Treatment: 3 Vital Signs Time Taken: 11:49 Temperature (F): 98.6 Height (in): 73 Pulse (bpm): 67 Weight (lbs): 151 Respiratory Rate (breaths/min): 16 Body Mass Index (BMI): 19.9 Blood Pressure (mmHg): 110/52 Reference Range: 80 - 120 mg / dl Electronic Signature(s) Signed: 02/14/2017 4:59:14 PM By: Gretta Cool, RN, BSN, Kim RN, BSN Entered By: Gretta Cool, RN, BSN, Kim on 02/14/2017 11:49:19

## 2017-02-21 DIAGNOSIS — M25562 Pain in left knee: Secondary | ICD-10-CM | POA: Diagnosis not present

## 2017-03-28 ENCOUNTER — Ambulatory Visit: Payer: Medicare Other | Admitting: Podiatry

## 2017-04-11 ENCOUNTER — Encounter: Payer: Self-pay | Admitting: Podiatry

## 2017-04-11 ENCOUNTER — Ambulatory Visit (INDEPENDENT_AMBULATORY_CARE_PROVIDER_SITE_OTHER): Payer: Medicare Other | Admitting: Podiatry

## 2017-04-11 DIAGNOSIS — M79674 Pain in right toe(s): Secondary | ICD-10-CM

## 2017-04-11 DIAGNOSIS — M79675 Pain in left toe(s): Secondary | ICD-10-CM

## 2017-04-11 DIAGNOSIS — B351 Tinea unguium: Secondary | ICD-10-CM | POA: Diagnosis not present

## 2017-04-11 NOTE — Progress Notes (Signed)
Subjective: 83 y.o. returns the office today for painful, elongated, thickened toenails which he cannot trim himself. Denies any redness or drainage around the nails. Denies any surrounding redness or drainage. Denies any acute changes since last appointment and no new complaints today. Denies any systemic complaints such as fevers, chills, nausea, vomiting.   Objective: AAO 3, NAD DP/PT pulses palpable, CRT less than 3 seconds Nails hypertrophic, dystrophic, elongated, brittle, discolored 10. There is tenderness overlying the nails 1-5 bilaterally. There is no surrounding erythema or drainage along the nail sites. No open lesions or pre-ulcerative lesions are identified. No other areas of tenderness bilateral lower extremities. No overlying edema, erythema, increased warmth. No pain with calf compression, swelling, warmth, erythema.  Assessment: Patient presents with symptomatic onychomycosis  Plan: -Treatment options including alternatives, risks, complications were discussed -Nails sharply debrided 10 without complication/bleeding. -Discussed daily foot inspection. If there are any changes, to call the office immediately.  -Follow-up in 3 months or sooner if any problems are to arise. In the meantime, encouraged to call the office with any questions, concerns, changes symptoms.  Celesta Gentile, DPM

## 2017-05-13 ENCOUNTER — Encounter: Payer: Medicare Other | Attending: Internal Medicine | Admitting: Internal Medicine

## 2017-05-13 DIAGNOSIS — G2581 Restless legs syndrome: Secondary | ICD-10-CM | POA: Diagnosis not present

## 2017-05-13 DIAGNOSIS — I872 Venous insufficiency (chronic) (peripheral): Secondary | ICD-10-CM | POA: Diagnosis not present

## 2017-05-13 DIAGNOSIS — G894 Chronic pain syndrome: Secondary | ICD-10-CM | POA: Insufficient documentation

## 2017-05-13 DIAGNOSIS — M419 Scoliosis, unspecified: Secondary | ICD-10-CM | POA: Insufficient documentation

## 2017-05-13 DIAGNOSIS — Z8546 Personal history of malignant neoplasm of prostate: Secondary | ICD-10-CM | POA: Diagnosis not present

## 2017-05-13 DIAGNOSIS — R296 Repeated falls: Secondary | ICD-10-CM | POA: Insufficient documentation

## 2017-05-13 DIAGNOSIS — S71112D Laceration without foreign body, left thigh, subsequent encounter: Secondary | ICD-10-CM | POA: Diagnosis present

## 2017-05-13 DIAGNOSIS — X58XXXD Exposure to other specified factors, subsequent encounter: Secondary | ICD-10-CM | POA: Diagnosis not present

## 2017-05-13 DIAGNOSIS — H5462 Unqualified visual loss, left eye, normal vision right eye: Secondary | ICD-10-CM | POA: Diagnosis not present

## 2017-05-13 DIAGNOSIS — M5136 Other intervertebral disc degeneration, lumbar region: Secondary | ICD-10-CM | POA: Insufficient documentation

## 2017-05-13 DIAGNOSIS — L03116 Cellulitis of left lower limb: Secondary | ICD-10-CM | POA: Insufficient documentation

## 2017-05-13 DIAGNOSIS — I890 Lymphedema, not elsewhere classified: Secondary | ICD-10-CM | POA: Diagnosis not present

## 2017-05-14 NOTE — Progress Notes (Addendum)
Casey Gates, Casey Gates ([id number]) Visit Report for 05/13/2017 Allergy List Details Patient Name: Casey Gates, Casey Gates Date of Service: 05/13/2017 2:30 PM Medical Record Number: [id number] Patient Account Number: [id number] Date of Birth/Sex: 1926/04/24 (83 y.o. Male) Treating RN: Montey Hora Primary Care Fortune Torosian: Velna Hatchet Other Clinician: Referring Bridgette Wolden: Velna Hatchet Treating Kyriana Yankee/Extender: [name] Weeks in Treatment: 0 Allergies Active Allergies nut - unspecified Allergy Notes Electronic Signature(s) Signed: 05/13/2017 5:15:33 PM By: Montey Hora Entered By: Montey Hora on 05/13/2017 14:32:31 Casey Gates ([id number]) -------------------------------------------------------------------------------- Arrival Information Details Patient Name: Casey Gates Date of Service: 05/13/2017 2:30 PM Medical Record Number: [id number] Patient Account Number: [id number] Date of Birth/Sex: [birth year] (83 y.o. Male) Treating RN: Montey Hora Primary Care Markiesha Delia: Velna Hatchet Other Clinician: Referring Lavonne Kinderman: Velna Hatchet Treating Axxel Gude/Extender: Tito Dine in Treatment: 0 Visit Information Patient Arrived: Walker Arrival Time: 14:31 Accompanied By: son Transfer Assistance: None Patient Identification Verified: Yes Secondary Verification Process Completed: Yes History Since Last Visit Added or deleted any medications: No Any new allergies or adverse reactions: No Had a fall or experienced change in activities of daily living that may affect risk of falls: No Signs or symptoms of abuse/neglect since last visito No Hospitalized since last visit: No Electronic Signature(s) Signed: 05/13/2017 5:15:33 PM By: Montey Hora Entered By: Montey Hora on 05/13/2017 14:31:29 Casey Gates ([id number]) -------------------------------------------------------------------------------- Clinic Level of Care Assessment Details Patient Name: Casey Gates Date of Service: 05/13/2017 2:30 PM Medical Record Number: [id number] Patient Account Number: [id number] Date of Birth/Sex: 1925/12/18 (83 y.o. Male) Treating RN: Montey Hora Primary Care Vernisha Bacote: Velna Hatchet Other Clinician: Referring Kayven Aldaco: Velna Hatchet Treating Rustyn Conery/Extender: [name] Weeks in Treatment: 0 Clinic Level of Care Assessment Items TOOL 2 Quantity Score []  - Use when only an EandM is performed on the INITIAL visit 0 ASSESSMENTS - Nursing Assessment / Reassessment X - General Physical Exam (combine w/ comprehensive assessment (listed just 1 20 below) when performed on new pt. evals) X - Comprehensive Assessment (HX, ROS, Risk Assessments, Wounds Hx, etc.) 1 25 ASSESSMENTS - Wound and Skin Assessment / Reassessment X - Simple Wound Assessment / Reassessment - one wound 1 5 []  - Complex Wound Assessment / Reassessment - multiple wounds 0 []  - Dermatologic / Skin Assessment (not related to wound area) 0 ASSESSMENTS - Ostomy and/or Continence Assessment and Care []  - Incontinence Assessment and Management 0 []  - Ostomy Care Assessment and Management (repouching, etc.) 0 PROCESS - Coordination of Care X - Simple Patient / Family Education for ongoing care 1 15 []  - Complex (extensive) Patient / Family Education for ongoing care 0 []  - Staff obtains Programmer, systems, Records, Test Results / Process Orders 0 []  - Staff telephones HHA, Nursing Homes / Clarify orders / etc 0 []  - Routine Transfer to another Facility (non-emergent condition) 0 []  - Routine Hospital Admission (non-emergent condition) 0 X - New Admissions / Biomedical engineer / Ordering NPWT, Apligraf, etc. 1 15 []  - Emergency Hospital Admission (emergent condition) 0 X - Simple Discharge Coordination 1 10 Casey Gates ([id number]) []  - Complex (extensive) Discharge Coordination 0 PROCESS - Special Needs []  - Pediatric / Minor Patient Management 0 []  - Isolation Patient  Management 0 []  - Hearing / Language / Visual special needs 0 []  - Assessment of Community assistance (transportation, D/C planning, etc.) 0 []  - Additional assistance / Altered mentation 0 []  - Support Surface(s) Assessment (bed, cushion, seat, etc.) 0 INTERVENTIONS - Wound Cleansing / Measurement X - Wound Imaging (photographs - any  number of wounds) 1 5 []  - Wound Tracing (instead of photographs) 0 X - Simple Wound Measurement - one wound 1 5 []  - Complex Wound Measurement - multiple wounds 0 X - Simple Wound Cleansing - one wound 1 5 []  - Complex Wound Cleansing - multiple wounds 0 INTERVENTIONS - Wound Dressings X - Small Wound Dressing one or multiple wounds 1 10 []  - Medium Wound Dressing one or multiple wounds 0 []  - Large Wound Dressing one or multiple wounds 0 []  - Application of Medications - injection 0 INTERVENTIONS - Miscellaneous []  - External ear exam 0 []  - Specimen Collection (cultures, biopsies, blood, body fluids, etc.) 0 []  - Specimen(s) / Culture(s) sent or taken to Lab for analysis 0 []  - Patient Transfer (multiple staff / Civil Service fast streamer / Similar devices) 0 []  - Simple Staple / Suture removal (25 or less) 0 []  - Complex Staple / Suture removal (26 or more) 0 Heindl, Noriel ([id number]) []  - Hypo / Hyperglycemic Management (close monitor of Blood Glucose) 0 []  - Ankle / Brachial Index (ABI) - do not check if billed separately 0 Has the patient been seen at the hospital within the last three years: Yes Total Score: 115 Level Of Care: New/Established - Level 3 Electronic Signature(s) Signed: 05/13/2017 5:15:33 PM By: Montey Hora Entered By: Montey Hora on 05/13/2017 16:49:59 [place], [place] ([id number]) -------------------------------------------------------------------------------- Encounter Discharge Information Details Patient Name: Casey Gates Date of Service: 05/13/2017 2:30 PM Medical Record Number: [id number] Patient Account Number: [id number] Date of  Birth/Sex: 1926-07-13 (83 y.o. Male) Treating RN: Montey Hora Primary Care Jaivian Battaglini: Velna Hatchet Other Clinician: Referring Lunabelle Oatley: Velna Hatchet Treating Vadis Slabach/Extender: Tito Dine in Treatment: 0 Encounter Discharge Information Items Discharge Pain Level: 0 Discharge Condition: Stable Ambulatory Status: Walker Discharge Destination: Home Transportation: Private Auto Accompanied By: son Schedule Follow-up Appointment: Yes Medication Reconciliation completed No and provided to Patient/Care Rebeccah Ivins: Provided on Clinical Summary of Care: 05/13/2017 Form Type Recipient Paper Patient MR Electronic Signature(s) Signed: 05/13/2017 3:26:17 PM By: Ruthine Dose Entered By: Ruthine Dose on 05/13/2017 15:26:17 [place], [place] ([id number]) -------------------------------------------------------------------------------- Lower Extremity Assessment Details Patient Name: Casey Gates Date of Service: 05/13/2017 2:30 PM Medical Record Number: [id number] Patient Account Number: [id number] Date of Birth/Sex: [birth year] (83 y.o. Male) Treating RN: Montey Hora Primary Care Ketsia Linebaugh: Velna Hatchet Other Clinician: Referring Kollin Udell: Velna Hatchet Treating Chezney Huether/Extender: [name] Weeks in Treatment: 0 Edema Assessment Assessed: [Left: No] [Right: No] Edema: [Left: N] [Right: o] Vascular Assessment Pulses: Dorsalis Pedis Palpable: [Left:Yes] Posterior Tibial Palpable: [Left:Yes] Extremity colors, hair growth, and conditions: Extremity Color: [Left:Hyperpigmented] Hair Growth on Extremity: [Left:No] Temperature of Extremity: [Left:Warm] Capillary Refill: [Left:< 3 seconds] Electronic Signature(s) Signed: 05/13/2017 5:15:33 PM By: Montey Hora Entered By: Montey Hora on 05/13/2017 14:48:37 Stgermaine, Ramel ([id number]) -------------------------------------------------------------------------------- Multi Wound Chart Details Patient Name:  Casey Gates Date of Service: 05/13/2017 2:30 PM Medical Record Number: [id number] Patient Account Number: [id number] Date of Birth/Sex: 09-Jan-1926 (83 y.o. Male) Treating RN: Montey Hora Primary Care Marselino Slayton: Velna Hatchet Other Clinician: Referring Terriah Reggio: Velna Hatchet Treating Kyra Laffey/Extender: [name] Weeks in Treatment: 0 Vital Signs Height(in): 69 Pulse(bpm): 71 Weight(lbs): 152 Blood Pressure 96/59 (mmHg): Body Mass Index(BMI): 22 Temperature(F): 97.7 Respiratory Rate 18 (breaths/min): Photos: [N/A:N/A] Wound Location: Left Upper Leg - Medial N/A N/A Wounding Event: Trauma N/A N/A Primary Etiology: Skin Tear N/A N/A Comorbid History: Lymphedema N/A N/A Date Acquired: 05/04/2017 N/A N/A Weeks of Treatment: 0 N/A N/A Wound Status: Open N/A N/A Measurements L x  W x D 3x3.5x0.1 N/A N/A (cm) Area (cm) : 8.247 N/A N/A Volume (cm) : 0.825 N/A N/A Classification: Partial Thickness N/A N/A Exudate Amount: Large N/A N/A Exudate Type: Serous N/A N/A Exudate Color: amber N/A N/A Wound Margin: Flat and Intact N/A N/A Granulation Amount: Large (67-100%) N/A N/A Granulation Quality: Red N/A N/A Necrotic Amount: Small (1-33%) N/A N/A Exposed Structures: Fascia: No N/A N/A Fat Layer (Subcutaneous Tissue) Exposed: No Tendon: No Klosowski, Shahmeer ([id number]) Muscle: No Joint: No Bone: No Epithelialization: None N/A N/A Periwound Skin Texture: Excoriation: No N/A N/A Induration: No Callus: No Crepitus: No Rash: No Scarring: No Periwound Skin Maceration: No N/A N/A Moisture: Dry/Scaly: No Periwound Skin Color: Erythema: Yes N/A N/A Atrophie Blanche: No Cyanosis: No Ecchymosis: No Hemosiderin Staining: No Mottled: No Pallor: No Rubor: No Erythema Location: Circumferential N/A N/A Temperature: No Abnormality N/A N/A Tenderness on Yes N/A N/A Palpation: Wound Preparation: Ulcer Cleansing: N/A N/A Rinsed/Irrigated with Saline Topical  Anesthetic Applied: Other: lidocaine 4% Treatment Notes Wound #3 (Left, Medial Upper Leg) 1. Cleansed with: Clean wound with Normal Saline 2. Anesthetic Topical Lidocaine 4% cream to wound bed prior to debridement 4. Dressing Applied: Prisma Ag 5. Secondary Dressing Applied Non-Adherent pad Gauze and Kerlix/Conform 7. Secured with Tape Notes netting [place], [place] ([id number]) Electronic Signature(s) Signed: 05/13/2017 6:01:27 PM By: Linton Ham MD Previous Signature: 05/13/2017 5:15:33 PM Version By: Montey Hora Entered By: Linton Ham on 05/13/2017 17:19:55 [place], [place] ([id number]) -------------------------------------------------------------------------------- Multi-Disciplinary Care Plan Details Patient Name: Casey Gates Date of Service: 05/13/2017 2:30 PM Medical Record Number: [id number] Patient Account Number: [id number] Date of Birth/Sex: February 05, 1926 (83 y.o. Male) Treating RN: Montey Hora Primary Care Curlee Bogan: Velna Hatchet Other Clinician: Referring Ramondo Dietze: Velna Hatchet Treating Davaun Quintela/Extender: [name] Weeks in Treatment: 0 Active Inactive ` Abuse / Safety / Falls / Self Care Management Nursing Diagnoses: Impaired physical mobility Potential for falls Goals: Patient will remain injury free related to falls Date Initiated: 05/13/2017 Target Resolution Date: 07/25/2017 Goal Status: Active Interventions: Assess fall risk on admission and as needed Notes: ` Orientation to the Wound Care Program Nursing Diagnoses: Knowledge deficit related to the wound healing center program Goals: Patient/caregiver will verbalize understanding of the [facility] Date Initiated: 05/13/2017 Target Resolution Date: 07/25/2017 Goal Status: Active Interventions: Provide education on orientation to the wound center Notes: ` Wound/Skin Impairment Nursing Diagnoses: Impaired tissue integrity [place], [place]  ([id number]) Goals: Patient/caregiver will verbalize understanding of skin care regimen Date Initiated: 05/13/2017 Target Resolution Date: 07/25/2017 Goal Status: Active Ulcer/skin breakdown will have a volume reduction of 30% by week 4 Date Initiated: 05/13/2017 Target Resolution Date: 07/25/2017 Goal Status: Active Ulcer/skin breakdown will have a volume reduction of 50% by week 8 Date Initiated: 05/13/2017 Target Resolution Date: 07/25/2017 Goal Status: Active Ulcer/skin breakdown will have a volume reduction of 80% by week 12 Date Initiated: 05/13/2017 Target Resolution Date: 07/25/2017 Goal Status: Active Ulcer/skin breakdown will heal within 14 weeks Date Initiated: 05/13/2017 Target Resolution Date: 07/25/2017 Goal Status: Active Interventions: Assess patient/caregiver ability to obtain necessary supplies Assess patient/caregiver ability to perform ulcer/skin care regimen upon admission and as needed Assess ulceration(s) every visit Notes: Electronic Signature(s) Signed: 05/13/2017 5:15:33 PM By: Montey Hora Entered By: Montey Hora on 05/13/2017 15:12:21 [place], [place] ([id number]) -------------------------------------------------------------------------------- Pain Assessment Details Patient Name: Casey Gates Date of Service: 05/13/2017 2:30 PM Medical Record Number: [id number] Patient Account Number: [id number] Date of Birth/Sex: 06-27-1926 (83 y.o. Male) Treating RN: Montey Hora Primary Care Laelynn Blizzard: Velna Hatchet  Other Clinician: Referring Taryll Reichenberger: Velna Hatchet Treating Montez Cuda/Extender: Tito Dine in Treatment: 0 Active Problems Location of Pain Severity and Description of Pain Patient Has Paino No Site Locations Pain Management and Medication Current Pain Management: Notes Topical or injectable lidocaine is offered to patient for acute pain when surgical debridement is performed. If needed, Patient is instructed to use over the counter  pain medication for the following 24-48 hours after debridement. Wound care MDs do not prescribed pain medications. Patient has chronic pain or uncontrolled pain. Patient has been instructed to make an appointment with their Primary Care Physician for pain management. Electronic Signature(s) Signed: 05/13/2017 5:15:33 PM By: Montey Hora Entered By: Montey Hora on 05/13/2017 14:31:58 [place], [place] ([id number]) -------------------------------------------------------------------------------- Patient/Caregiver Education Details Patient Name: Casey Gates Date of Service: 05/13/2017 2:30 PM Medical Record Patient Account Number: [id number] Number: Treating RN: Montey Hora 1925-12-09 (83 y.o. Other Clinician: Date of Birth/Gender: Male) Treating ROBSON, MICHAEL Primary Care Physician: Velna Hatchet Physician/Extender: G Referring Physician: De Burrs in Treatment: 0 Education Assessment Education Provided To: Patient and Caregiver Education Topics Provided Wound/Skin Impairment: Handouts: Other: wound care as ordered Methods: Explain/Verbal Responses: State content correctly Electronic Signature(s) Signed: 05/13/2017 5:15:33 PM By: Montey Hora Entered By: Montey Hora on 05/13/2017 15:13:55 [place], [place] ([id number]) -------------------------------------------------------------------------------- Wound Assessment Details Patient Name: Casey Gates Date of Service: 05/13/2017 2:30 PM Medical Record Number: [id number] Patient Account Number: [id number] Date of Birth/Sex: 08-22-1926 (83 y.o. Male) Treating RN: Montey Hora Primary Care Regnia Mathwig: Velna Hatchet Other Clinician: Referring Shyloh Derosa: Velna Hatchet Treating Ashland Osmer/Extender: [name] Weeks in Treatment: 0 Wound Status Wound Number: 3 Primary Etiology: Skin Tear Wound Location: Left Upper Leg - Medial Wound Status: Open Wounding Event: Trauma Comorbid History:  Lymphedema Date Acquired: 05/04/2017 Weeks Of Treatment: 0 Clustered Wound: No Photos Wound Measurements Length: (cm) 3 Width: (cm) 3.5 Depth: (cm) 0.1 Area: (cm) 8.247 Volume: (cm) 0.825 % Reduction in Area: 0% % Reduction in Volume: 0% Epithelialization: None Tunneling: No Undermining: No Wound Description Full Thickness Without Exposed Classification: Support Structures Wound Margin: Flat and Intact Exudate Large Amount: Exudate Type: Serous Exudate Color: amber Foul Odor After Cleansing: No Slough/Fibrino Yes Wound Bed Granulation Amount: Large (67-100%) Exposed Structure Granulation Quality: Red Fascia Exposed: No Necrotic Amount: Small (1-33%) Fat Layer (Subcutaneous Tissue) Exposed: No [place], [place] ([id number]) Necrotic Quality: Adherent Slough Tendon Exposed: No Muscle Exposed: No Joint Exposed: No Bone Exposed: No Periwound Skin Texture Texture Color No Abnormalities Noted: No No Abnormalities Noted: No Callus: No Atrophie Blanche: No Crepitus: No Cyanosis: No Excoriation: No Ecchymosis: No Induration: No Erythema: Yes Rash: No Erythema Location: Circumferential Scarring: No Hemosiderin Staining: No Mottled: No Moisture Pallor: No No Abnormalities Noted: No Rubor: No Dry / Scaly: No Maceration: No Temperature / Pain Temperature: No Abnormality Tenderness on Palpation: Yes Wound Preparation Ulcer Cleansing: Rinsed/Irrigated with Saline Topical Anesthetic Applied: Other: lidocaine 4%, Treatment Notes Wound #3 (Left, Medial Upper Leg) 1. Cleansed with: Clean wound with Normal Saline 2. Anesthetic Topical Lidocaine 4% cream to wound bed prior to debridement 4. Dressing Applied: Prisma Ag 5. Secondary Dressing Applied Non-Adherent pad Gauze and Kerlix/Conform 7. Secured with Tape Notes netting Electronic Signature(s) Signed: 05/14/2017 2:49:26 PM By: Montey Hora Previous Signature: 05/13/2017 5:15:33 PM Version By: Montey Hora Entered By: Montey Hora on 05/14/2017 14:49:25 [place], [place] ([id number]) [place], [place] ([id number]) -------------------------------------------------------------------------------- Vitals Details Patient Name: Casey Gates Date of Service: 05/13/2017 2:30 PM Medical Record Number: [id number] Patient Account Number: [id number] Date  of Birth/Sex: 05-27-1926 (83 y.o. Male) Treating RN: Montey Hora Primary Care Jaydeen Odor: Velna Hatchet Other Clinician: Referring Skiler Olden: Velna Hatchet Treating Ethel Veronica/Extender: Tito Dine in Treatment: 0 Vital Signs Time Taken: 14:37 Temperature (F): 97.7 Height (in): 69 Pulse (bpm): 71 Source: Measured Respiratory Rate (breaths/min): 18 Weight (lbs): 152 Blood Pressure (mmHg): 96/59 Source: Measured Reference Range: 80 - 120 mg / dl Body Mass Index (BMI): 22.4 Electronic Signature(s) Signed: 05/13/2017 5:15:33 PM By: Montey Hora Entered By: Montey Hora on 05/13/2017 14:39:24

## 2017-05-14 NOTE — Progress Notes (Signed)
WILDER, KUROWSKI ([id number]) Visit Report for 05/13/2017 Abuse/Suicide Risk Screen Details Patient Name: Casey Gates, Casey Gates Date of Service: 05/13/2017 2:30 PM Medical Record Patient Account Number: [id number] Number: Treating RN: Montey Hora March 14, 1926 (83 y.o. Other Clinician: Date of Birth/Sex: Male) Treating ROBSON, MICHAEL Primary Care Joriel Streety: Velna Hatchet Anaysia Germer/Extender: G Referring Lacrisha Bielicki: De Burrs in Treatment: 0 Abuse/Suicide Risk Screen Items Answer ABUSE/SUICIDE RISK SCREEN: Has anyone close to you tried to hurt or harm you recentlyo No Do you feel uncomfortable with anyone in your familyo No Has anyone forced you do things that you didnot want to doo No Do you have any thoughts of harming yourselfo No Patient displays signs or symptoms of abuse and/or neglect. No Electronic Signature(s) Signed: 05/13/2017 5:15:33 PM By: Montey Hora Entered By: Montey Hora on 05/13/2017 14:35:23 [place], [place] ([id number]) -------------------------------------------------------------------------------- Activities of Daily Living Details Patient Name: Casey Gates Date of Service: 05/13/2017 2:30 PM Medical Record Patient Account Number: [id number] Number: Treating RN: Montey Hora 1926-07-31 (83 y.o. Other Clinician: Date of Birth/Sex: Male) Treating ROBSON, MICHAEL Primary Care Nykolas Bacallao: Velna Hatchet Mieshia Pepitone/Extender: G Referring Dolce Sylvia: De Burrs in Treatment: 0 Activities of Daily Living Items Answer Activities of Daily Living (Please select one for each item) Drive Automobile Not Able Take Medications Need Assistance Use Telephone Need Assistance Care for Appearance Need Assistance Use Toilet Need Assistance Bath / Shower Need Assistance Dress Self Need Assistance Feed Self Completely Able Walk Need Assistance Get In / Out Bed Need Assistance Housework Need Assistance Prepare Meals Need Assistance Handle  Money Need Assistance Shop for Self Need Assistance Electronic Signature(s) Signed: 05/13/2017 5:15:33 PM By: Montey Hora Entered By: Montey Hora on 05/13/2017 14:36:02 [place], [place] ([id number]) -------------------------------------------------------------------------------- Education Assessment Details Patient Name: Casey Gates Date of Service: 05/13/2017 2:30 PM Medical Record Patient Account Number: [id number] Number: Treating RN: Montey Hora 1926-09-18 (83 y.o. Other Clinician: Date of Birth/Sex: Male) Treating ROBSON, MICHAEL Primary Care Justyce Yeater: Velna Hatchet Olivya Sobol/Extender: G Referring Chaselynn Kepple: De Burrs in Treatment: 0 Primary Learner Assessed: Caregiver Reason Patient is not Primary Learner: limited vision Learning Preferences/Education Level/Primary Language Learning Preference: Explanation, Demonstration Highest Education Level: College or Above Preferred Language: English Cognitive Barrier Assessment/Beliefs Language Barrier: No Translator Needed: No Memory Deficit: No Emotional Barrier: No Cultural/Religious Beliefs Affecting Medical No Care: Physical Barrier Assessment Impaired Vision: No Impaired Hearing: No Decreased Hand dexterity: No Knowledge/Comprehension Assessment Knowledge Level: Medium Comprehension Level: Medium Ability to understand written Medium instructions: Ability to understand verbal Medium instructions: Motivation Assessment Anxiety Level: Calm Cooperation: Cooperative Education Importance: Acknowledges Need Interest in Health Problems: Asks Questions Perception: Coherent Willingness to Engage in Self- Medium Management Activities: Medium [place], [place] ([id number]) Readiness to Engage in Self- Management Activities: Electronic Signature(s) Signed: 05/13/2017 5:15:33 PM By: Montey Hora Entered By: Montey Hora on 05/13/2017 14:36:38 [place], [place]  ([id number]) -------------------------------------------------------------------------------- Fall Risk Assessment Details Patient Name: Casey Gates Date of Service: 05/13/2017 2:30 PM Medical Record Patient Account Number: [id number] Number: Treating RN: Montey Hora 1926-08-19 (83 y.o. Other Clinician: Date of Birth/Sex: Male) Treating ROBSON, MICHAEL Primary Care Demontay Grantham: Velna Hatchet Neymar Dowe/Extender: G Referring Karmello Abercrombie: De Burrs in Treatment: 0 Fall Risk Assessment Items Have you had 2 or more falls in the last 12 monthso 0 Yes Have you had any fall that resulted in injury in the last 12 monthso 0 No FALL RISK ASSESSMENT: History of falling - immediate or within 3 months 25 Yes Secondary diagnosis 0 No Ambulatory aid None/bed rest/wheelchair/nurse 0 No Crutches/cane/walker  15 Yes Furniture 0 No IV Access/Saline Lock 0 No Gait/Training Normal/bed rest/immobile 0 No Weak 10 Yes Impaired 0 No Mental Status Oriented to own ability 0 Yes Electronic Signature(s) Signed: 05/13/2017 5:15:33 PM By: Montey Hora Entered By: Montey Hora on 05/13/2017 15:14:40 [place], [place] ([id number]) -------------------------------------------------------------------------------- Nutrition Risk Assessment Details Patient Name: Casey Gates Date of Service: 05/13/2017 2:30 PM Medical Record Patient Account Number: [id number] Number: Treating RN: Montey Hora 04/03/1926 (83 y.o. Other Clinician: Date of Birth/Sex: Male) Treating ROBSON, MICHAEL Primary Care Hargun Spurling: Velna Hatchet Kobi Mario/Extender: G Referring [place]: De Burrs in Treatment: 0 Height (in): 73 Weight (lbs): 151 Body Mass Index (BMI): 19.9 Nutrition Risk Assessment Items NUTRITION RISK SCREEN: I have an illness or condition that made me change the kind and/or 0 No amount of food I eat I eat fewer than two meals per day 0 No I eat few fruits and  vegetables, or milk products 0 No I have three or more drinks of beer, liquor or wine almost every day 0 No I have tooth or mouth problems that make it hard for me to eat 0 No I don't always have enough money to buy the food I need 0 No I eat alone most of the time 0 No I take three or more different prescribed or over-the-counter drugs a 1 Yes day Without wanting to, I have lost or gained 10 pounds in the last six 0 No months I am not always physically able to shop, cook and/or feed myself 0 No Nutrition Protocols Good Risk Protocol 0 No interventions needed Moderate Risk Protocol Electronic Signature(s) Signed: 05/13/2017 5:15:33 PM By: Montey Hora Entered By: Montey Hora on 05/13/2017 14:36:58

## 2017-05-14 NOTE — Progress Notes (Addendum)
**Note Casey-Identified via Obfuscation** Casey Gates ([id number]) Visit Report for 05/13/2017 Chief Complaint Document Details Patient Name: Casey Gates Date of Service: 05/13/2017 2:30 PM Medical Record Patient Account Number: [id number] Number: Treating RN: Casey Gates 07/09/1926 (83 y.o. Other Clinician: Date of Birth/Sex: Male) Treating Casey Gates Primary Care Provider: Velna Gates Provider/Extender: Casey Gates Referring Provider: De Gates in Treatment: 0 Information Obtained from: Patient Chief Complaint Patient seen for complaints of Non-Healing Wound to the left lower extremity which she's had for about a month. Electronic Signature(s) Signed: 05/13/2017 6:01:27 PM By: Casey Ham MD Entered By: Casey Gates on 05/13/2017 17:20:30 Casey Gates, Casey Gates ([id number]) -------------------------------------------------------------------------------- HPI Details Patient Name: Casey Gates Date of Service: 05/13/2017 2:30 PM Medical Record Patient Account Number: [id number] Number: Treating RN: Casey Gates 10-18-1926 (83 y.o. Other Clinician: Date of Birth/Sex: Male) Treating Casey Gates Primary Care Provider: Velna Gates Provider/Extender: Casey Gates Referring Provider: De Gates in Treatment: 0 History of Present Illness Location: anterior and posterior lateral left lower extremity Quality: Patient reports experiencing a dull pain to affected area(s). Severity: Patient states wound (s) are getting better. Duration: Patient has had the wound for < 4 Gates prior to presenting for treatment Timing: Pain in wound is constant (hurts all the time) Context: The wound occurred when the patient his walker with his legs and had injuries to them Modifying Factors: Other treatment(s) tried include:as been seeing dermatology for management of his problems Associated Signs and Symptoms: Patient reports having increase swelling. HPI Description: A fairly healthy 83 year old  gentleman whose chronic medical problems include lumbar degenerative disc disease, kyphoscoliosis, chronic pain syndrome, history of syncope, history of food impaction, prostate cancer treated via TURP in 1997, left eye blindness and stasis and edema in the lower extremities. he walks with the help of a walker and on 2 separate occasions has injured his legs more on the left side with a left anterior and left posterior lateral injuries. Is been seen by dermatology for problems with stasis ulceration and lymphedema in the past and this time around has been treated by them with local care and a couple of courses of antibiotics which include doxycycline and Bactrim. Also been applying Bactroban ointment locally. 01/31/2017 -- he's been having dressing changes 3 times a week but still has a lot of drainage. 05/13/17; READMISSION this is patient who was seen a few months ago by Dr.britto. At that point he had fallen and injured his lower extremities. He-year-old with simple dressings and compression. He has a history of venous insufficiency with ulceration and lymphedema. He walks with a walker. The patient tells me that 10 days ago he was in the bathroom and his legs gave way and he thinks he traumatizes the medial part of his left thigh on the walker. He has been used Tegaderm and Kerlix as the primary dressing. At some point he was seen in his primary care office and had a considerable amount of erythema around the wound which was marked. He was put on Bactrim for 10 days which ends on Friday. By his son's pictures on his cell phone the erythematous considerably better today. Electronic Signature(s) Signed: 05/13/2017 6:01:27 PM By: Casey Ham MD Entered By: Casey Gates on 05/13/2017 17:23:04 [place], [place] ([id number]) -------------------------------------------------------------------------------- Physical Exam Details Patient Name: Casey Gates Date of Service: 05/13/2017 2:30  PM Medical Record Patient Account Number: [id number] Number: Treating RN: Casey Gates [birth year] (83 y.o. Other Clinician: Date of Birth/Sex: Male) Treating Casey Gates Primary Care Provider: Velna Gates Provider/Extender: Casey Gates Referring Provider:  [name], Casey Gates in Treatment: 0 Constitutional Patient is hypertensive.. Pulse regular and within target range for patient.Marland Kitchen Respirations regular, non-labored and within target range.. Temperature is normal and within the target range for the patient.Marland Kitchen appears in no distress. Eyes Conjunctivae clear. No discharge. Respiratory Respiratory effort is easy and symmetric bilaterally. Rate is normal at rest and on room air.. Cardiovascular Pedal pulses palpable and strong bilaterally.. Patient has chronic venous insufficiency and probably considerable degree of solar skin damage. There is no major uncontrolled swelling in his lower extremities. Musculoskeletal Patient complained of left knee pain although the exam here is fairly normal there is no ligamentous damage. He was put on diclofenac by his primary M.D.. Integumentary (Hair, Skin) Significant solar skin damage in his arms and legs. Neurological Absent knee reflexes bilaterally. Considerable wasting in his thigh musculature. Psychiatric No evidence of depression, anxiety, or agitation. Calm, cooperative, and communicative. Appropriate interactions and affect.. Notes Wound exam; the patient has a fairly superficial wound on the medial left thigh. No need for debridement here. There is some surrounding erythema although this seems to be getting better. No palpable tenderness. He has very frail skin in this area Electronic Signature(s) Signed: 05/13/2017 6:01:27 PM By: Casey Ham MD Entered By: Casey Gates on 05/13/2017 17:25:54 [place], Casey Gates ([id number]) -------------------------------------------------------------------------------- Physician Orders  Details Patient Name: Casey Gates Date of Service: 05/13/2017 2:30 PM Medical Record Patient Account Number: [id number] Number: Treating RN: Casey Gates [birth year] (83 y.o. Other Clinician: Date of Birth/Sex: Male) Treating Remas Sobel Primary Care Provider: Velna Gates Provider/Extender: Casey Gates Referring Provider: De Gates in Treatment: 0 Verbal / Phone Orders: No Diagnosis Coding Wound Cleansing Wound #3 Left,Medial Upper Leg o Clean wound with Normal Saline. o May Shower, gently pat wound dry prior to applying new dressing. Anesthetic Wound #3 Left,Medial Upper Leg o Topical Lidocaine 4% cream applied to wound bed prior to debridement Primary Wound Dressing Wound #3 Left,Medial Upper Leg o Prisma Ag - or collagen with silver equivalent Secondary Dressing Wound #3 Left,Medial Upper Leg o Gauze and Kerlix/Conform o Non-adherent pad Dressing Change Frequency Wound #3 Left,Medial Upper Leg o Change dressing every other day. Follow-up Appointments Wound #3 Left,Medial Upper Leg o Return Appointment in 1 week. Additional Orders / Instructions Wound #3 Left,Medial Upper Leg o Increase protein intake. o Other: - Please add vitamin A, vitamin C and zinc supplements to your diet Home Health Wound #3 Left,Medial Upper Leg Casey Gates, Casey Gates ([id number]) o [place] for [place] Nurse may visit PRN to address patientos wound care needs. o FACE TO FACE ENCOUNTER: MEDICARE and MEDICAID PATIENTS: I certify that this patient is under my care and that I had a face-to-face encounter that meets the physician face-to-face encounter requirements with this patient on this date. The encounter with the patient was in whole or in part for the following MEDICAL CONDITION: (primary reason for [place]) MEDICAL NECESSITY: I certify, that based on my findings, NURSING services are a medically necessary  home health service. HOME BOUND STATUS: I certify that my clinical findings support that this patient is homebound (i.e., Due to illness or injury, pt requires aid of supportive devices such as crutches, cane, wheelchairs, walkers, the use of special transportation or the assistance of another person to leave their place of residence. There is a normal inability to leave the home and doing so requires considerable and taxing effort. Other absences are for medical reasons / religious services and are infrequent or  of short duration when for other reasons). o If current dressing causes regression in wound condition, may D/C ordered dressing product/s and apply Normal Saline Moist Dressing daily until next [place] / Other MD appointment. [place] of regression in wound condition at [phone]. o Please direct any NON-WOUND related issues/requests for orders to patient's Primary Care Physician Electronic Signature(s) Signed: 05/13/2017 5:15:33 PM By: Casey Gates Signed: 05/13/2017 6:01:27 PM By: Casey Ham MD Entered By: Casey Gates on 05/13/2017 15:20:30 [place], [place] ([id number]) -------------------------------------------------------------------------------- Problem List Details Patient Name: Casey Gates Date of Service: 05/13/2017 2:30 PM Medical Record Patient Account Number: [id number] Number: Treating RN: Casey Gates October 25, 1926 (83 y.o. Other Clinician: Date of Birth/Sex: Male) Treating Jamelah Sitzer Primary Care Provider: Velna Gates Provider/Extender: Casey Gates Referring Provider: De Gates in Treatment: 0 Active Problems ICD-10 Encounter Code Description Active Date Diagnosis S71.112D Laceration without foreign body, left thigh, subsequent 05/13/2017 Yes encounter L03.116 Cellulitis of left lower limb 05/13/2017 Yes R29.6 Repeated falls 05/13/2017 Yes Inactive Problems Resolved Problems Electronic  Signature(s) Signed: 05/13/2017 6:01:27 PM By: Casey Ham MD Entered By: Casey Gates on 05/13/2017 17:19:47 [place], [place] ([id number]) -------------------------------------------------------------------------------- Progress Note Details Patient Name: Casey Gates Date of Service: 05/13/2017 2:30 PM Medical Record Patient Account Number: [id number] Number: Treating RN: Casey Gates [birth year] (83 y.o. Other Clinician: Date of Birth/Sex: Male) Treating Trease [place] Primary Care Provider: Velna Gates Provider/Extender: Casey Gates Referring Provider: De Gates in Treatment: 0 Subjective Chief Complaint Information obtained from Patient Patient seen for complaints of Non-Healing Wound to the left lower extremity which she's had for about a month. History of Present Illness (HPI) The following HPI elements were documented for the patient's wound: Location: anterior and posterior lateral left lower extremity Quality: Patient reports experiencing a dull pain to affected area(s). Severity: Patient states wound (s) are getting better. Duration: Patient has had the wound for < 4 Gates prior to presenting for treatment Timing: Pain in wound is constant (hurts all the time) Context: The wound occurred when the patient his walker with his legs and had injuries to them Modifying Factors: Other treatment(s) tried include:as been seeing dermatology for management of his problems Associated Signs and Symptoms: Patient reports having increase swelling. A fairly healthy 82 year old gentleman whose chronic medical problems include lumbar degenerative disc disease, kyphoscoliosis, chronic pain syndrome, history of syncope, history of food impaction, prostate cancer treated via TURP in 1997, left eye blindness and stasis and edema in the lower extremities. he walks with the help of a walker and on 2 separate occasions has injured his legs more on the left side with a left  anterior and left posterior lateral injuries. Is been seen by dermatology for problems with stasis ulceration and lymphedema in the past and this time around has been treated by them with local care and a couple of courses of antibiotics which include doxycycline and Bactrim. Also been applying Bactroban ointment locally. 01/31/2017 -- he's been having dressing changes 3 times a week but still has a lot of drainage. 05/13/17; READMISSION this is patient who was seen a few months ago by Dr.britto. At that point he had fallen and injured his lower extremities. He-year-old with simple dressings and compression. He has a history of venous insufficiency with ulceration and lymphedema. He walks with a walker. The patient tells me that 10 days ago he was in the bathroom and his legs gave way and he thinks he traumatizes the medial part of his left thigh  on the walker. He has been used Tegaderm and Kerlix as the primary dressing. At some point he was seen in his primary care office and had a considerable amount of erythema around the wound which was marked. He was put on Bactrim for 10 days which ends on Friday. By his son's pictures on his cell phone the [facility], [place] ([id number]) erythematous considerably better today. Wound History Patient presents with 1 open wound that has been present for approximately June 3. Patient has been treating wound in the following manner: dry bandage. Laboratory tests have not been performed in the last month. Patient reportedly has not tested positive for an antibiotic resistant organism. Patient reportedly has not tested positive for osteomyelitis. Patient History Information obtained from Patient. Allergies nut - unspecified Family History Cancer - Mother, Hypertension - Mother, No family history of Diabetes, Heart Disease, Kidney Disease, Lung Disease, Seizures, Stroke, Thyroid Problems, Tuberculosis. Social History Never smoker, Marital Status - Widowed,  Alcohol Use - Never, Drug Use - No History, Caffeine Use - Moderate. Medical History Hematologic/Lymphatic Patient has history of Lymphedema Medical And Surgical History Notes Constitutional Symptoms (General Health) Restless Leg, Chronic Back Pain Eyes Blind in left eye Cardiovascular venous stasis Musculoskeletal lumbar DDD, kyphoscoliosis Oncologic Prostate Cancer 31 years ago - TURP 1997 Objective Casey Gates, Casey Gates ([id number]) Constitutional Patient is hypertensive.. Pulse regular and within target range for patient.Marland Kitchen Respirations regular, non-labored and within target range.. Temperature is normal and within the target range for the patient.Marland Kitchen appears in no distress. Vitals Time Taken: 2:37 PM, Height: 69 in, Source: Measured, Weight: 152 lbs, Source: Measured, BMI: 22.4, Temperature: 97.7 F, Pulse: 71 bpm, Respiratory Rate: 18 breaths/min, Blood Pressure: 96/59 mmHg. Eyes Conjunctivae clear. No discharge. Respiratory Respiratory effort is easy and symmetric bilaterally. Rate is normal at rest and on room air.. Cardiovascular Pedal pulses palpable and strong bilaterally.. Patient has chronic venous insufficiency and probably considerable degree of solar skin damage. There is no major uncontrolled swelling in his lower extremities. Musculoskeletal Patient complained of left knee pain although the exam here is fairly normal there is no ligamentous damage. He was put on diclofenac by his primary M.D.. Neurological Absent knee reflexes bilaterally. Considerable wasting in his thigh musculature. Psychiatric No evidence of depression, anxiety, or agitation. Calm, cooperative, and communicative. Appropriate interactions and affect.. General Notes: Wound exam; the patient has a fairly superficial wound on the medial left thigh. No need for debridement here. There is some surrounding erythema although this seems to be getting better. No palpable tenderness. He has very frail skin  in this area Integumentary (Hair, Skin) Significant solar skin damage in his arms and legs. Wound #3 status is Open. Original cause of wound was Trauma. The wound is located on the Left,Medial Upper Leg. The wound measures 3cm length x 3.5cm width x 0.1cm depth; 8.247cm^2 area and 0.825cm^3 volume. There is no tunneling or undermining noted. There is a large amount of serous drainage noted. The wound margin is flat and intact. There is large (67-100%) red granulation within the wound bed. There is a small (1-33%) amount of necrotic tissue within the wound bed including Adherent Slough. The periwound skin appearance exhibited: Erythema. The periwound skin appearance did not exhibit: Callus, Crepitus, Excoriation, Induration, Rash, Scarring, Dry/Scaly, Maceration, Atrophie Blanche, Cyanosis, Ecchymosis, Hemosiderin Staining, Mottled, Pallor, Rubor. The surrounding wound skin color is noted with erythema which is circumferential. Periwound temperature was noted as No Abnormality. The periwound has tenderness on palpation. [place], [place] ([id number]) Assessment Active Problems  ICD-10 S71.112D - Laceration without foreign body, left thigh, subsequent encounter L03.116 - Cellulitis of left lower limb R29.6 - Repeated falls Plan Wound Cleansing: Wound #3 Left,Medial Upper Leg: Clean wound with Normal Saline. May Shower, gently pat wound dry prior to applying new dressing. Anesthetic: Wound #3 Left,Medial Upper Leg: Topical Lidocaine 4% cream applied to wound bed prior to debridement Primary Wound Dressing: Wound #3 Left,Medial Upper Leg: Prisma Ag - or collagen with silver equivalent Secondary Dressing: Wound #3 Left,Medial Upper Leg: Gauze and Kerlix/Conform Non-adherent pad Dressing Change Frequency: Wound #3 Left,Medial Upper Leg: Change dressing every other day. Follow-up Appointments: Wound #3 Left,Medial Upper Leg: Return Appointment in 1 week. Additional Orders /  Instructions: Wound #3 Left,Medial Upper Leg: Increase protein intake. Other: - Please add vitamin A, vitamin C and zinc supplements to your diet Home Health: Wound #3 Left,Medial Upper Leg: [place] for [place] Nurse may visit PRN to address patient s wound care needs. FACE TO FACE ENCOUNTER: MEDICARE and MEDICAID PATIENTS: I certify that this patient is under my care and that I had a face-to-face encounter that meets the physician face-to-face encounter requirements with this patient on this date. The encounter with the patient was in whole or in part for the following MEDICAL CONDITION: (primary reason for [place]) MEDICAL NECESSITY: I certify, [place], [place] ([id number]) that based on my findings, NURSING services are a medically necessary home health service. HOME BOUND STATUS: I certify that my clinical findings support that this patient is homebound (i.e., Due to illness or injury, pt requires aid of supportive devices such as crutches, cane, wheelchairs, walkers, the use of special transportation or the assistance of another person to leave their place of residence. There is a normal inability to leave the home and doing so requires considerable and taxing effort. Other absences are for medical reasons / religious services and are infrequent or of short duration when for other reasons). If current dressing causes regression in wound condition, may D/C ordered dressing product/s and apply Normal Saline Moist Dressing daily until next [place] / Other MD appointment. [place] of regression in wound condition at [phone]. Please direct any NON-WOUND related issues/requests for orders to patient's Primary Care Physician #1 the patient's wound doesn't look particularly ominous. We use Prisma, hydrogel nonadherent kerlix and conform #2 I don't believe that any further antibiotics or additional cultures were necessary.  He has enough of the trimethoprim sulfamethoxazole to get him through to Friday. There is been a remarkable improvement in this area in terms of cellulitis Electronic Signature(s) Signed: 05/21/2017 8:25:42 AM By: Gretta Cool, BSN, RN, CWS, Kim RN, BSN Signed: 05/28/2017 5:07:07 PM By: Casey Ham MD Previous Signature: 05/13/2017 6:01:27 PM Version By: Casey Ham MD Entered By: Gretta Cool, BSN, RN, CWS, Kim on 05/21/2017 08:25:41 Casey Gates, Casey Gates ([id number]) -------------------------------------------------------------------------------- ROS/PFSH Details Patient Name: Casey Gates Date of Service: 05/13/2017 2:30 PM Medical Record Patient Account Number: [id number] Number: Treating RN: Casey Gates Oct 26, 1926 (83 y.o. Other Clinician: Date of Birth/Sex: Male) Treating Aara [place], [place] Primary Care Provider: Velna Gates Provider/Extender: Casey Gates Referring Provider: De Gates in Treatment: 0 Information Obtained From Patient Wound History Do you currently have one or more open woundso Yes How many open wounds do you currently haveo 1 Approximately how long have you had your woundso June 3 How have you been treating your wound(s) until nowo dry bandage Has your wound(s) ever healed and then re-openedo No Have you had any  lab work done in the past montho No Have you tested positive for an antibiotic resistant organism (MRSA, VRE)o No Have you tested positive for osteomyelitis (bone infection)o No Constitutional Symptoms (General Health) Medical History: Past Medical History Notes: Restless Leg, Chronic Back Pain Eyes Medical History: Negative for: Cataracts; Glaucoma; Optic Neuritis 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 Ear/Nose/Mouth/Throat Medical History: Negative for: Chronic sinus problems/congestion; Middle ear problems Hematologic/Lymphatic Medical History: Positive for: Lymphedema Negative for: Anemia; Hemophilia; Human Immunodeficiency  Virus; Sickle Cell Disease Respiratory Medical History: Negative for: Aspiration; Asthma; Chronic Obstructive Pulmonary Disease (COPD); Pneumothorax; Sleep [place], [place] ([id number]) Apnea; Tuberculosis Cardiovascular Medical History: Negative for: Angina; Arrhythmia; Congestive Heart Failure; Coronary Artery Disease; Deep Vein Thrombosis; Hypertension; Hypotension; Myocardial Infarction; Peripheral Arterial Disease; Peripheral Venous Disease; Phlebitis; Vasculitis Past Medical History Notes: venous stasis Gastrointestinal Medical History: Negative for: Cirrhosis ; Colitis; Crohnos; Hepatitis A; Hepatitis B; Hepatitis C Endocrine Medical History: Negative for: Type I Diabetes; Type II Diabetes Genitourinary Medical History: Negative for: End Stage Renal Disease Immunological Medical History: Negative for: Lupus Erythematosus; Raynaudos; Scleroderma Integumentary (Skin) Medical History: Negative for: History of Burn; History of pressure wounds Musculoskeletal Medical History: Negative for: Gout; Rheumatoid Arthritis; Osteoarthritis; Osteomyelitis Past Medical History Notes: lumbar DDD, kyphoscoliosis Neurologic Medical History: Negative for: Dementia; Neuropathy; Quadriplegia; Paraplegia; Seizure Disorder Oncologic Medical History: Negative for: Received Chemotherapy; Received Radiation Past Medical History Notes: [place], [place] ([id number]) Prostate Cancer 31 years ago - TURP 1997 Psychiatric Medical History: Negative for: Anorexia/bulimia; Confinement Anxiety Immunizations Pneumococcal Vaccine: Received Pneumococcal Vaccination: Yes Family and Social History Cancer: Yes - Mother; Diabetes: No; Heart Disease: No; Hypertension: Yes - Mother; Kidney Disease: No; Lung Disease: No; Seizures: No; Stroke: No; Thyroid Problems: No; Tuberculosis: No; Never smoker; Marital Status - Widowed; Alcohol Use: Never; Drug Use: No History; Caffeine Use: Moderate; Financial Concerns:  No; Food, Clothing or Shelter Needs: No; Support System Lacking: No; Transportation Concerns: No; Advanced Directives: No; Patient does not want information on Advanced Directives; Living Will: No; Medical Power of Attorney: No Electronic Signature(s) Signed: 05/13/2017 5:15:33 PM By: Casey Gates Signed: 05/13/2017 6:01:27 PM By: Casey Ham MD Entered By: Casey Gates on 05/13/2017 14:35:08 [place], [place] ([id number]) -------------------------------------------------------------------------------- SuperBill Details Patient Name: Casey Gates Date of Service: 05/13/2017 Medical Record Patient Account Number: [id number] Number: Treating RN: Casey Gates 1926/06/26 (83 y.o. Other Clinician: Date of Birth/Sex: Male) Treating Arhianna Ebey Primary Care Provider: Velna Gates Provider/Extender: Casey Gates Referring Provider: De Gates in Treatment: 0 Diagnosis Coding ICD-10 Codes Code Description S71.112D Laceration without foreign body, left thigh, subsequent encounter L03.116 Cellulitis of left lower limb R29.6 Repeated falls Facility Procedures CPT4 Code: [id number] Description: 85631 - WOUND CARE VISIT-LEV 3 EST PT Modifier: Quantity: 1 Physician Procedures CPT4 Code Description: 4970263 99214 - WC PHYS LEVEL 4 - EST PT ICD-10 Description Diagnosis S71.112D Laceration without foreign body, left thigh, subse L03.116 Cellulitis of left lower limb Modifier: quent encount Quantity: 1 er Engineer, maintenance) Signed: 05/13/2017 6:01:27 PM By: Casey Ham MD Entered By: Casey Gates on 05/13/2017 17:28:04

## 2017-05-20 ENCOUNTER — Encounter: Payer: Medicare Other | Admitting: Internal Medicine

## 2017-05-20 DIAGNOSIS — S71112D Laceration without foreign body, left thigh, subsequent encounter: Secondary | ICD-10-CM | POA: Diagnosis not present

## 2017-05-21 NOTE — Progress Notes (Addendum)
[place], [place] ([id number]) Visit Report for 05/20/2017 Arrival Information Details Patient Name: Casey Gates, Casey Gates Date of Service: 05/20/2017 12:30 PM Medical Record Number: [id number] Patient Account Number: [id number] Date of Birth/Sex: [birth year] (83 y.o. Male) Treating RN: Casey Gates Primary Care Casey Gates: Casey Gates Other Clinician: Referring Casey Gates: Casey Gates Treating Casey Gates/Extender: Casey Gates in Treatment: 1 Visit Information History Since Last Visit Added or deleted any medications: No Patient Arrived: [name] Any new allergies or adverse reactions: No Arrival Time: 12:38 Had a fall or experienced change in No Accompanied By: son activities of daily living that may affect Transfer Assistance: None risk of falls: Patient Identification Verified: Yes Signs or symptoms of abuse/neglect since last No Secondary Verification Process Completed: Yes visito Hospitalized since last visit: No Has Dressing in Place as Prescribed: Yes Pain Present Now: No Electronic Signature(s) Signed: 05/20/2017 5:17:10 PM By: Casey Gates Entered By: Casey Gates on 05/20/2017 12:38:23 [place], [place] ([id number]) -------------------------------------------------------------------------------- Clinic Level of Care Assessment Details Patient Name: Casey Gates Date of Service: 05/20/2017 12:30 PM Medical Record Number: [id number] Patient Account Number: [id number] Date of Birth/Sex: [birth year] (83 y.o. Male) Treating RN: Casey Gates Primary Care Casey Gates: Casey Gates Other Clinician: Referring Casey Gates: Casey Gates Treating Ange Puskas/Extender: Casey Gates in Treatment: 1 Clinic Level of Care Assessment Items TOOL 4 Quantity Score []  - Use when only an EandM is performed on FOLLOW-UP visit 0 ASSESSMENTS - Nursing Assessment / Reassessment X - Reassessment of Co-morbidities (includes updates in patient status) 1 10 X - Reassessment of  Adherence to Treatment Plan 1 5 ASSESSMENTS - Wound and Skin Assessment / Reassessment X - Simple Wound Assessment / Reassessment - one wound 1 5 []  - Complex Wound Assessment / Reassessment - multiple wounds 0 []  - Dermatologic / Skin Assessment (not related to wound area) 0 ASSESSMENTS - Focused Assessment []  - Circumferential Edema Measurements - multi extremities 0 []  - Nutritional Assessment / Counseling / Intervention 0 []  - Lower Extremity Assessment (monofilament, tuning fork, pulses) 0 []  - Peripheral Arterial Disease Assessment (using hand held doppler) 0 ASSESSMENTS - Ostomy and/or Continence Assessment and Care []  - Incontinence Assessment and Management 0 []  - Ostomy Care Assessment and Management (repouching, etc.) 0 PROCESS - Coordination of Care X - Simple Patient / Family Education for ongoing care 1 15 []  - Complex (extensive) Patient / Family Education for ongoing care 0 []  - Staff obtains Programmer, systems, Records, Test Results / Process Orders 0 []  - Staff telephones HHA, Nursing Homes / Clarify orders / etc 0 []  - Routine Transfer to another Facility (non-emergent condition) 0 Casey Gates ([id number]) []  - Routine Hospital Admission (non-emergent condition) 0 []  - New Admissions / Biomedical engineer / Ordering NPWT, Apligraf, etc. 0 []  - Emergency Hospital Admission (emergent condition) 0 X - Simple Discharge Coordination 1 10 []  - Complex (extensive) Discharge Coordination 0 PROCESS - Special Needs []  - Pediatric / Minor Patient Management 0 []  - Isolation Patient Management 0 []  - Hearing / Language / Visual special needs 0 []  - Assessment of Community assistance (transportation, D/C planning, etc.) 0 []  - Additional assistance / Altered mentation 0 []  - Support Surface(s) Assessment (bed, cushion, seat, etc.) 0 INTERVENTIONS - Wound Cleansing / Measurement X - Simple Wound Cleansing - one wound 1 5 []  - Complex Wound Cleansing - multiple wounds 0 X - Wound  Imaging (photographs - any number of wounds) 1 5 []  - Wound Tracing (instead of photographs) 0 X - Simple Wound Measurement - one  wound 1 5 []  - Complex Wound Measurement - multiple wounds 0 INTERVENTIONS - Wound Dressings X - Small Wound Dressing one or multiple wounds 1 10 []  - Medium Wound Dressing one or multiple wounds 0 []  - Large Wound Dressing one or multiple wounds 0 []  - Application of Medications - topical 0 []  - Application of Medications - injection 0 INTERVENTIONS - Miscellaneous []  - External ear exam 0 Casey Gates ([id number]) []  - Specimen Collection (cultures, biopsies, blood, body fluids, etc.) 0 []  - Specimen(s) / Culture(s) sent or taken to Lab for analysis 0 []  - Patient Transfer (multiple staff / Harrel Lemon Lift / Similar devices) 0 []  - Simple Staple / Suture removal (25 or less) 0 []  - Complex Staple / Suture removal (26 or more) 0 []  - Hypo / Hyperglycemic Management (close monitor of Blood Glucose) 0 []  - Ankle / Brachial Index (ABI) - do not check if billed separately 0 X - Vital Signs 1 5 Has the patient been seen at the hospital within the last three years: Yes Total Score: 75 Level Of Care: New/Established - Level 2 Electronic Signature(s) Signed: 05/20/2017 5:17:10 PM By: Casey Gates Entered By: Casey Gates on 05/20/2017 13:05:15 [employer], [place] ([id number]) -------------------------------------------------------------------------------- Encounter Discharge Information Details Patient Name: Casey Gates Date of Service: 05/20/2017 12:30 PM Medical Record Number: [id number] Patient Account Number: [id number] Date of Birth/Sex: 1926-11-05 (83 y.o. Male) Treating RN: Casey Gates Primary Care Copelyn Widmer: Casey Gates Other Clinician: Referring Mahlia Fernando: Casey Gates Treating Ranny Wiebelhaus/Extender: Casey Gates in Treatment: 1 Encounter Discharge Information Items Discharge Pain Level: 0 Discharge Condition: Stable Ambulatory Status:  [name] Discharge Destination: Home Transportation: Private Auto Accompanied By: son Schedule Follow-up Appointment: Yes Medication Reconciliation completed No and provided to Patient/Care Steele Ledonne: Provided on Clinical Summary of Care: 05/20/2017 Form Type Recipient Paper Patient MR Electronic Signature(s) Signed: 05/20/2017 1:12:11 PM By: Ruthine Dose Entered By: Ruthine Dose on 05/20/2017 13:12:11 [place], [place] ([id number]) -------------------------------------------------------------------------------- Multi Wound Chart Details Patient Name: Casey Gates Date of Service: 05/20/2017 12:30 PM Medical Record Number: [id number] Patient Account Number: [id number] Date of Birth/Sex: 1926/04/09 (83 y.o. Male) Treating RN: Casey Gates Primary Care Edwinna Rochette: Casey Gates Other Clinician: Referring Jayen Bromwell: Casey Gates Treating Venora Kautzman/Extender: Casey Gates in Treatment: 1 Vital Signs Height(in): 69 Pulse(bpm): 64 Weight(lbs): 152 Blood Pressure 129/52 (mmHg): Body Mass Index(BMI): 22 Temperature(F): 97.4 Respiratory Rate 18 (breaths/min): Photos: [N/A:N/A] Wound Location: Left Upper Leg - Medial N/A N/A Wounding Event: Trauma N/A N/A Primary Etiology: Skin Tear N/A N/A Comorbid History: Lymphedema N/A N/A Date Acquired: 05/04/2017 N/A N/A Gates of Treatment: 1 N/A N/A Wound Status: Open N/A N/A Measurements L x W x D 2.5x2.1x0.1 N/A N/A (cm) Area (cm) : 4.123 N/A N/A Volume (cm) : 0.412 N/A N/A % Reduction in Area: 50.00% N/A N/A % Reduction in Volume: 50.10% N/A N/A Classification: Full Thickness Without N/A N/A Exposed Support Structures Exudate Amount: Large N/A N/A Exudate Type: Serous N/A N/A Exudate Color: amber N/A N/A Wound Margin: Flat and Intact N/A N/A Granulation Amount: Large (67-100%) N/A N/A Granulation Quality: Red N/A N/A Necrotic Amount: Small (1-33%) N/A N/A [place], [place] ([id number]) Exposed Structures: Fascia:  No N/A N/A Fat Layer (Subcutaneous Tissue) Exposed: No Tendon: No Muscle: No Joint: No Bone: No Epithelialization: None N/A N/A Periwound Skin Texture: Excoriation: No N/A N/A Induration: No Callus: No Crepitus: No Rash: No Scarring: No Periwound Skin Maceration: No N/A N/A Moisture: Dry/Scaly: No Periwound Skin Color: Erythema: Yes N/A N/A Atrophie Blanche: No  Cyanosis: No Ecchymosis: No Hemosiderin Staining: No Mottled: No Pallor: No Rubor: No Erythema Location: Circumferential N/A N/A Temperature: No Abnormality N/A N/A Tenderness on Yes N/A N/A Palpation: Wound Preparation: Ulcer Cleansing: N/A N/A Rinsed/Irrigated with Saline Topical Anesthetic Applied: Other: lidocaine 4% Treatment Notes Wound #3 (Left, Medial Upper Leg) 1. Cleansed with: Clean wound with Normal Saline 2. Anesthetic Topical Lidocaine 4% cream to wound bed prior to debridement 4. Dressing Applied: Prisma Ag Contact layer 5. Secondary Dressing Applied Kerlix/Conform Non-Adherent pad 7. Secured with [place], [place] ([id number]) Tape Notes netting Electronic Signature(s) Signed: 05/20/2017 4:47:51 PM By: Linton Ham MD Entered By: Linton Ham on 05/20/2017 13:42:29 [place], [place] ([id number]) -------------------------------------------------------------------------------- Multi-Disciplinary Care Plan Details Patient Name: Casey Gates Date of Service: 05/20/2017 12:30 PM Medical Record Number: [id number] Patient Account Number: [id number] Date of Birth/Sex: 1926-09-04 (83 y.o. Male) Treating RN: Casey Gates Primary Care Chanteria Haggard: Casey Gates Other Clinician: Referring Priyansh Pry: Casey Gates Treating Luvada Salamone/Extender: Casey Gates in Treatment: 1 Active Inactive Electronic Signature(s) Signed: 05/28/2017 5:08:26 PM By: Gretta Cool, BSN, RN, CWS, Kim RN, BSN Signed: 05/29/2017 4:27:27 PM By: Casey Gates Previous Signature: 05/20/2017 5:17:10 PM Version By:  Casey Gates Entered By: Gretta Cool BSN, RN, CWS, Kim on 05/28/2017 17:08:26 [place], [place] ([id number]) -------------------------------------------------------------------------------- Pain Assessment Details Patient Name: Casey Gates Date of Service: 05/20/2017 12:30 PM Medical Record Number: [id number] Patient Account Number: [id number] Date of Birth/Sex: [birth year] (83 y.o. Male) Treating RN: Casey Gates Primary Care Miley Lindon: Casey Gates Other Clinician: Referring Shemiah Rosch: Casey Gates Treating Brandyce Dimario/Extender: Casey Gates in Treatment: 1 Active Problems Location of Pain Severity and Description of Pain Patient Has Paino No Site Locations Pain Management and Medication Current Pain Management: Notes Topical or injectable lidocaine is offered to patient for acute pain when surgical debridement is performed. If needed, Patient is instructed to use over the counter pain medication for the following 24-48 hours after debridement. Wound care MDs do not prescribed pain medications. Patient has chronic pain or uncontrolled pain. Patient has been instructed to make an appointment with their Primary Care Physician for pain management. Electronic Signature(s) Signed: 05/20/2017 5:17:10 PM By: Casey Gates Entered By: Casey Gates on 05/20/2017 12:38:39 [place], [place] ([id number]) -------------------------------------------------------------------------------- Patient/Caregiver Education Details Patient Name: Casey Gates Date of Service: 05/20/2017 12:30 PM Medical Record Patient Account Number: [id number] Number: Treating RN: Casey Gates Feb 05, 1926 (83 y.o. Other Clinician: Date of Birth/Gender: Male) Treating [place], [place] Primary Care Physician: Casey Gates Physician/Extender: G Referring Physician: De Burrs in Treatment: 1 Education Assessment Education Provided To: Patient and Caregiver Education Topics  Provided Wound/Skin Impairment: Handouts: Other: wound care as ordered Methods: Demonstration, Explain/Verbal Responses: State content correctly Electronic Signature(s) Signed: 05/20/2017 5:17:10 PM By: Casey Gates Entered By: Casey Gates on 05/20/2017 12:46:41 [place], [place] ([id number]) -------------------------------------------------------------------------------- Wound Assessment Details Patient Name: Casey Gates Date of Service: 05/20/2017 12:30 PM Medical Record Number: [id number] Patient Account Number: [id number] Date of Birth/Sex: 27-Aug-1926 (83 y.o. Male) Treating RN: Casey Gates Primary Care Aarica Wax: Casey Gates Other Clinician: Referring Jette Lewan: Casey Gates Treating Anouk Critzer/Extender: Casey Gates in Treatment: 1 Wound Status Wound Number: 3 Primary Etiology: Skin Tear Wound Location: Left Upper Leg - Medial Wound Status: Open Wounding Event: Trauma Comorbid History: Lymphedema Date Acquired: 05/04/2017 Gates Of Treatment: 1 Clustered Wound: No Photos Wound Measurements Length: (cm) 2.5 Width: (cm) 2.1 Depth: (cm) 0.1 Area: (cm) 4.123 Volume: (cm) 0.412 % Reduction in Area: 50% % Reduction in Volume: 50.1% Epithelialization: None Tunneling: No Undermining: No Wound Description  Full Thickness Without Exposed Classification: Support Structures Wound Margin: Flat and Intact Exudate Large Amount: Exudate Type: Serous Exudate Color: amber Foul Odor After Cleansing: No Slough/Fibrino Yes Wound Bed Granulation Amount: Large (67-100%) Exposed Structure Granulation Quality: Red Fascia Exposed: No Necrotic Amount: Small (1-33%) Fat Layer (Subcutaneous Tissue) Exposed: No [place], [place] ([id number]) Necrotic Quality: Adherent Slough Tendon Exposed: No Muscle Exposed: No Joint Exposed: No Bone Exposed: No Periwound Skin Texture Texture Color No Abnormalities Noted: No No Abnormalities Noted: No Callus: No Atrophie  Blanche: No Crepitus: No Cyanosis: No Excoriation: No Ecchymosis: No Induration: No Erythema: Yes Rash: No Erythema Location: Circumferential Scarring: No Hemosiderin Staining: No Mottled: No Moisture Pallor: No No Abnormalities Noted: No Rubor: No Dry / Scaly: No Maceration: No Temperature / Pain Temperature: No Abnormality Tenderness on Palpation: Yes Wound Preparation Ulcer Cleansing: Rinsed/Irrigated with Saline Topical Anesthetic Applied: Other: lidocaine 4%, Electronic Signature(s) Signed: 05/20/2017 5:17:10 PM By: Casey Gates Entered By: Casey Gates on 05/20/2017 13:00:58 [place], [place] ([id number]) -------------------------------------------------------------------------------- Vitals Details Patient Name: Casey Gates Date of Service: 05/20/2017 12:30 PM Medical Record Number: [id number] Patient Account Number: [id number] Date of Birth/Sex: [birth year] (83 y.o. Male) Treating RN: Casey Gates Primary Care Corretta Munce: Casey Gates Other Clinician: Referring Xylon Croom: Casey Gates Treating Salome Hautala/Extender: Casey Gates in Treatment: 1 Vital Signs Time Taken: 12:39 Temperature (F): 97.4 Height (in): 69 Pulse (bpm): 64 Weight (lbs): 152 Respiratory Rate (breaths/min): 18 Body Mass Index (BMI): 22.4 Blood Pressure (mmHg): 129/52 Reference Range: 80 - 120 mg / dl Electronic Signature(s) Signed: 05/20/2017 5:17:10 PM By: Casey Gates Entered By: Casey Gates on 05/20/2017 12:40:21

## 2017-05-21 NOTE — Progress Notes (Signed)
Casey Gates, Casey Gates ([id number]) Visit Report for 05/20/2017 Chief Complaint Document Details Patient Name: Casey Gates, Casey Gates Date of Service: 05/20/2017 12:30 PM Medical Record Patient Account Number: [id number] Number: Treating RN: Montey Hora 10-04-1926 (83 y.o. Other Clinician: Date of Birth/Sex: Male) Treating ROBSON, MICHAEL Primary Care Provider: Velna Hatchet Provider/Extender: G Referring Provider: De Burrs in Treatment: 1 Information Obtained from: Patient Chief Complaint Patient seen for complaints of Non-Healing Wound to the left lower extremity which he's had for about a month. Electronic Signature(s) Signed: 05/20/2017 4:47:51 PM By: Linton Ham MD Entered By: Linton Ham on 05/20/2017 13:43:02 Casey Gates ([id number]) -------------------------------------------------------------------------------- HPI Details Patient Name: Casey Gates Date of Service: 05/20/2017 12:30 PM Medical Record Patient Account Number: [id number] Number: Treating RN: Montey Hora Jul 10, 1926 (83 y.o. Other Clinician: Date of Birth/Sex: Male) Treating ROBSON, MICHAEL Primary Care Provider: Velna Hatchet Provider/Extender: G Referring Provider: De Burrs in Treatment: 1 History of Present Illness Location: anterior and posterior lateral left lower extremity Quality: Patient reports experiencing a dull pain to affected area(s). Severity: Patient states wound (s) are getting better. Duration: Patient has had the wound for < 4 weeks prior to presenting for treatment Timing: Pain in wound is constant (hurts all the time) Context: The wound occurred when the patient his walker with his legs and had injuries to them Modifying Factors: Other treatment(s) tried include:as been seeing dermatology for management of his problems Associated Signs and Symptoms: Patient reports having increase swelling. HPI Description: A fairly healthy 83 year old  gentleman whose chronic medical problems include lumbar degenerative disc disease, kyphoscoliosis, chronic pain syndrome, history of syncope, history of food impaction, prostate cancer treated via TURP in 1997, left eye blindness and stasis and edema in the lower extremities. he walks with the help of a walker and on 2 separate occasions has injured his legs more on the left side with a left anterior and left posterior lateral injuries. Is been seen by dermatology for problems with stasis ulceration and lymphedema in the past and this time around has been treated by them with local care and a couple of courses of antibiotics which include doxycycline and Bactrim. Also been applying Bactroban ointment locally. 01/31/2017 -- he's been having dressing changes 3 times a week but still has a lot of drainage. 05/13/17; READMISSION this is patient who was seen a few months ago by Dr.britto. At that point he had fallen and injured his lower extremities. He-year-old with simple dressings and compression. He has a history of venous insufficiency with ulceration and lymphedema. He walks with a walker. The patient tells me that 10 days ago he was in the bathroom and his legs gave way and he thinks he traumatizes the medial part of his left thigh on the walker. He has been used Tegaderm and Kerlix as the primary dressing. At some point he was seen in his primary care office and had a considerable amount of erythema around the wound which was marked. He was put on Bactrim for 10 days which ends on Friday. By his son's pictures on his cell phone the erythematous considerably better today. 05/20/17; patient has completed his antibiotics and his wound continues to look better. The only problem this week is that the collagen appears to of stop to the wound surface and made for a difficult debridement in spite of this the wound actually looks better Electronic Signature(s) Signed: 05/20/2017 4:47:51 PM By: Linton Ham MD [facility], Casey Gates ([id number]) Entered By: Linton Ham on 05/20/2017 13:44:00 [name], [name] (  [id number]) -------------------------------------------------------------------------------- Physical Exam Details Patient Name: Casey Gates Date of Service: 05/20/2017 12:30 PM Medical Record Patient Account Number: [id number] Number: Treating RN: Montey Hora 08-Dec-1925 (83 y.o. Other Clinician: Date of Birth/Sex: Male) Treating ROBSON, MICHAEL Primary Care Provider: Velna Hatchet Provider/Extender: G Referring Provider: De Burrs in Treatment: 1 Constitutional Sitting or standing Blood Pressure is within target range for patient.. Pulse regular and within target range for patient.Marland Kitchen Respirations regular, non-labored and within target range.. Temperature is normal and within the target range for the patient.Marland Kitchen appears in no distress. Eyes Conjunctivae clear. No discharge. Respiratory Respiratory effort is easy and symmetric bilaterally. Rate is normal at rest and on room air.. Cardiovascular Edema present in both extremities. Patient has severe chronic venous insufficiency and he showed me some superficial excoriations. Lymphatic Unpalpable in the left inguinal area. Integumentary (Hair, Skin) No rashes visible. No erythema around the wound. No soft tissue crepitus. Psychiatric No evidence of depression, anxiety, or agitation. Calm, cooperative, and communicative. Appropriate interactions and affect.. Notes Wound exam superficial wound on the left medial thigh. No evidence of the extensive surrounding cellulitis that was previously marked I think by his primary physician. There is no surrounding erythema this week. He was wound bed looks healthy. Some advancing epithelialization no debridement is required Electronic Signature(s) Signed: 05/20/2017 4:47:51 PM By: Linton Ham MD Entered By: Linton Ham on 05/20/2017 13:46:03 [name], [name]  ([id number]) -------------------------------------------------------------------------------- Physician Orders Details Patient Name: Casey Gates Date of Service: 05/20/2017 12:30 PM Medical Record Patient Account Number: [id number] Number: Treating RN: Montey Hora 10/20/1926 (83 y.o. Other Clinician: Date of Birth/Sex: Male) Treating ROBSON, MICHAEL Primary Care Provider: Velna Hatchet Provider/Extender: G Referring Provider: De Burrs in Treatment: 1 Verbal / Phone Orders: No Diagnosis Coding Wound Cleansing Wound #3 Left,Medial Upper Leg o Clean wound with Normal Saline. o May Shower, gently pat wound dry prior to applying new dressing. Anesthetic Wound #3 Left,Medial Upper Leg o Topical Lidocaine 4% cream applied to wound bed prior to debridement Primary Wound Dressing Wound #3 Left,Medial Upper Leg o Prisma Ag - or collagen with silver equivalent o Other: - please use adaptic or oil emulsion contact layer on wound then put prisma on top of contact layer Secondary Dressing Wound #3 Left,Medial Upper Leg o Gauze and Kerlix/Conform o Non-adherent pad Dressing Change Frequency Wound #3 Left,Medial Upper Leg o Change dressing every other day. Follow-up Appointments Wound #3 Left,Medial Upper Leg o Return Appointment in 1 week. Additional Orders / Instructions Wound #3 Left,Medial Upper Leg o Increase protein intake. o Other: - Please add vitamin A, vitamin C and zinc supplements to your diet Cronin, Brison ([id number]) [place] #3 Left,Medial Upper Leg o [place] for [place] Nurse may visit PRN to address patientos wound care needs. o FACE TO FACE ENCOUNTER: MEDICARE and MEDICAID PATIENTS: I certify that this patient is under my care and that I had a face-to-face encounter that meets the physician face-to-face encounter requirements with this patient on this date. The  encounter with the patient was in whole or in part for the following MEDICAL CONDITION: (primary reason for [place]) MEDICAL NECESSITY: I certify, that based on my findings, NURSING services are a medically necessary home health service. HOME BOUND STATUS: I certify that my clinical findings support that this patient is homebound (i.e., Due to illness or injury, pt requires aid of supportive devices such as crutches, cane, wheelchairs, walkers, the use of special transportation or  the assistance of another person to leave their place of residence. There is a normal inability to leave the home and doing so requires considerable and taxing effort. Other absences are for medical reasons / religious services and are infrequent or of short duration when for other reasons). o If current dressing causes regression in wound condition, may D/C ordered dressing product/s and apply Normal Saline Moist Dressing daily until next [place] / Other MD appointment. [place] of regression in wound condition at [phone]. o Please direct any NON-WOUND related issues/requests for orders to patient's Primary Care Physician Electronic Signature(s) Signed: 05/20/2017 4:47:51 PM By: Linton Ham MD Signed: 05/20/2017 5:17:10 PM By: Montey Hora Entered By: Montey Hora on 05/20/2017 13:03:35 [place], Isaia ([id number]) -------------------------------------------------------------------------------- Problem List Details Patient Name: Casey Gates Date of Service: 05/20/2017 12:30 PM Medical Record Patient Account Number: [id number] Number: Treating RN: Montey Hora [birth year] (83 y.o. Other Clinician: Date of Birth/Sex: Male) Treating ROBSON, MICHAEL Primary Care Provider: Velna Hatchet Provider/Extender: G Referring Provider: De Burrs in Treatment: 1 Active Problems ICD-10 Encounter Code Description Active  Date Diagnosis S71.112D Laceration without foreign body, left thigh, subsequent 05/13/2017 Yes encounter L03.116 Cellulitis of left lower limb 05/13/2017 Yes R29.6 Repeated falls 05/13/2017 Yes Inactive Problems Resolved Problems Electronic Signature(s) Signed: 05/20/2017 4:47:51 PM By: Linton Ham MD Entered By: Linton Ham on 05/20/2017 13:42:18 Mettler, [place] ([id number]) -------------------------------------------------------------------------------- Progress Note Details Patient Name: Casey Gates Date of Service: 05/20/2017 12:30 PM Medical Record Patient Account Number: [id number] Number: Treating RN: Montey Hora May 26, 1926 (83 y.o. Other Clinician: Date of Birth/Sex: Male) Treating ROBSON, MICHAEL Primary Care Provider: Velna Hatchet Provider/Extender: G Referring Provider: De Burrs in Treatment: 1 Subjective Chief Complaint Information obtained from Patient Patient seen for complaints of Non-Healing Wound to the left lower extremity which he's had for about a month. History of Present Illness (HPI) The following HPI elements were documented for the patient's wound: Location: anterior and posterior lateral left lower extremity Quality: Patient reports experiencing a dull pain to affected area(s). Severity: Patient states wound (s) are getting better. Duration: Patient has had the wound for < 4 weeks prior to presenting for treatment Timing: Pain in wound is constant (hurts all the time) Context: The wound occurred when the patient his walker with his legs and had injuries to them Modifying Factors: Other treatment(s) tried include:as been seeing dermatology for management of his problems Associated Signs and Symptoms: Patient reports having increase swelling. A fairly healthy 83 year old gentleman whose chronic medical problems include lumbar degenerative disc disease, kyphoscoliosis, chronic pain syndrome, history of syncope, history  of food impaction, prostate cancer treated via TURP in 1997, left eye blindness and stasis and edema in the lower extremities. he walks with the help of a walker and on 2 separate occasions has injured his legs more on the left side with a left anterior and left posterior lateral injuries. Is been seen by dermatology for problems with stasis ulceration and lymphedema in the past and this time around has been treated by them with local care and a couple of courses of antibiotics which include doxycycline and Bactrim. Also been applying Bactroban ointment locally. 01/31/2017 -- he's been having dressing changes 3 times a week but still has a lot of drainage. 05/13/17; READMISSION this is patient who was seen a few months ago by Dr.britto. At that point he had fallen and injured his lower extremities. He-year-old with simple dressings and compression. He has a history of  venous insufficiency with ulceration and lymphedema. He walks with a walker. The patient tells me that 10 days ago he was in the bathroom and his legs gave way and he thinks he traumatizes the medial part of his left thigh on the walker. He has been used Tegaderm and Kerlix as the primary dressing. At some point he was seen in his primary care office and had a considerable amount of erythema around the wound which was marked. He was put on Bactrim for 10 days which ends on Friday. By his son's pictures on his cell phone the [facility], [place] ([id number]) erythematous considerably better today. 05/20/17; patient has completed his antibiotics and his wound continues to look better. The only problem this week is that the collagen appears to of stop to the wound surface and made for a difficult debridement in spite of this the wound actually looks better Objective Constitutional Sitting or standing Blood Pressure is within target range for patient.. Pulse regular and within target range for patient.Marland Kitchen Respirations regular, non-labored and  within target range.. Temperature is normal and within the target range for the patient.Marland Kitchen appears in no distress. Vitals Time Taken: 12:39 PM, Height: 69 in, Weight: 152 lbs, BMI: 22.4, Temperature: 97.4 F, Pulse: 64 bpm, Respiratory Rate: 18 breaths/min, Blood Pressure: 129/52 mmHg. Eyes Conjunctivae clear. No discharge. Respiratory Respiratory effort is easy and symmetric bilaterally. Rate is normal at rest and on room air.. Cardiovascular Edema present in both extremities. Patient has severe chronic venous insufficiency and he showed me some superficial excoriations. Lymphatic Unpalpable in the left inguinal area. Psychiatric No evidence of depression, anxiety, or agitation. Calm, cooperative, and communicative. Appropriate interactions and affect.. General Notes: Wound exam superficial wound on the left medial thigh. No evidence of the extensive surrounding cellulitis that was previously marked I think by his primary physician. There is no surrounding erythema this week. He was wound bed looks healthy. Some advancing epithelialization no debridement is required Integumentary (Hair, Skin) No rashes visible. No erythema around the wound. No soft tissue crepitus. Wound #3 status is Open. Original cause of wound was Trauma. The wound is located on the Left,Medial Upper Leg. The wound measures 2.5cm length x 2.1cm width x 0.1cm depth; 4.123cm^2 area and 0.412cm^3 volume. There is no tunneling or undermining noted. There is a large amount of serous drainage Walsh, Kazuto ([id number]) noted. The wound margin is flat and intact. There is large (67-100%) red granulation within the wound bed. There is a small (1-33%) amount of necrotic tissue within the wound bed including Adherent Slough. The periwound skin appearance exhibited: Erythema. The periwound skin appearance did not exhibit: Callus, Crepitus, Excoriation, Induration, Rash, Scarring, Dry/Scaly, Maceration, Atrophie Blanche,  Cyanosis, Ecchymosis, Hemosiderin Staining, Mottled, Pallor, Rubor. The surrounding wound skin color is noted with erythema which is circumferential. Periwound temperature was noted as No Abnormality. The periwound has tenderness on palpation. Assessment Active Problems ICD-10 S71.112D - Laceration without foreign body, left thigh, subsequent encounter L03.116 - Cellulitis of left lower limb R29.6 - Repeated falls Plan Wound Cleansing: Wound #3 Left,Medial Upper Leg: Clean wound with Normal Saline. May Shower, gently pat wound dry prior to applying new dressing. Anesthetic: Wound #3 Left,Medial Upper Leg: Topical Lidocaine 4% cream applied to wound bed prior to debridement Primary Wound Dressing: Wound #3 Left,Medial Upper Leg: Prisma Ag - or collagen with silver equivalent Other: - please use adaptic or oil emulsion contact layer on wound then put prisma on top of contact layer Secondary Dressing: Wound #3  Left,Medial Upper Leg: Gauze and Kerlix/Conform Non-adherent pad Dressing Change Frequency: Wound #3 Left,Medial Upper Leg: Change dressing every other day. Follow-up Appointments: Wound #3 Left,Medial Upper Leg: Return Appointment in 1 week. Additional Orders / Instructions: Wound #3 Left,Medial Upper Leg: Reynolds, Aaronmichael ([id number]) Increase protein intake. Other: - Please add vitamin A, vitamin C and zinc supplements to your diet Home Health: Wound #3 Left,Medial Upper Leg: [place] for [place] Nurse may visit PRN to address patient s wound care needs. FACE TO FACE ENCOUNTER: MEDICARE and MEDICAID PATIENTS: I certify that this patient is under my care and that I had a face-to-face encounter that meets the physician face-to-face encounter requirements with this patient on this date. The encounter with the patient was in whole or in part for the following MEDICAL CONDITION: (primary reason for [place]) MEDICAL NECESSITY: I  certify, that based on my findings, NURSING services are a medically necessary home health service. HOME BOUND STATUS: I certify that my clinical findings support that this patient is homebound (i.e., Due to illness or injury, pt requires aid of supportive devices such as crutches, cane, wheelchairs, walkers, the use of special transportation or the assistance of another person to leave their place of residence. There is a normal inability to leave the home and doing so requires considerable and taxing effort. Other absences are for medical reasons / religious services and are infrequent or of short duration when for other reasons). If current dressing causes regression in wound condition, may D/C ordered dressing product/s and apply Normal Saline Moist Dressing daily until next [place] / Other MD appointment. [place] of regression in wound condition at [phone]. Please direct any NON-WOUND related issues/requests for orders to patient's Primary Care Physician #1 we applied Tegaderm with collagen and hydrogel over the wound surface. This will prevent the collagen from sticking #2 the patient has chronic venous insufficiency and some excoriations in his left lower leg I recommended a combination of TCA 0.1% which she already has and a moisturizing skin cream Electronic Signature(s) Signed: 05/20/2017 4:47:51 PM By: Linton Ham MD Entered By: Linton Ham on 05/20/2017 13:47:25 Sword, [place] ([id number]) -------------------------------------------------------------------------------- SuperBill Details Patient Name: Casey Gates Date of Service: 05/20/2017 Medical Record Patient Account Number: [id number] Number: Treating RN: Montey Hora Apr 09, 1926 (83 y.o. Other Clinician: Date of Birth/Sex: Male) Treating ROBSON, MICHAEL Primary Care Provider: Velna Hatchet Provider/Extender: G Referring Provider: De Burrs in  Treatment: 1 Diagnosis Coding ICD-10 Codes Code Description S71.112D Laceration without foreign body, left thigh, subsequent encounter L03.116 Cellulitis of left lower limb R29.6 Repeated falls Facility Procedures CPT4 Code: [id number] Description: 25427 - WOUND CARE VISIT-LEV 2 EST PT Modifier: Quantity: 1 Physician Procedures CPT4 Code Description: [id number] 83151 - WC PHYS LEVEL 3 - EST PT ICD-10 Description Diagnosis S71.112D Laceration without foreign body, left thigh, subse L03.116 Cellulitis of left lower limb Modifier: quent encount Quantity: 1 er Engineer, maintenance) Signed: 05/20/2017 4:47:51 PM By: Linton Ham MD Entered By: Linton Ham on 05/20/2017 13:47:42

## 2017-05-23 ENCOUNTER — Emergency Department (HOSPITAL_COMMUNITY): Payer: Medicare Other

## 2017-05-23 ENCOUNTER — Encounter (HOSPITAL_COMMUNITY): Payer: Self-pay | Admitting: Emergency Medicine

## 2017-05-23 ENCOUNTER — Emergency Department (HOSPITAL_COMMUNITY)
Admission: EM | Admit: 2017-05-23 | Discharge: 2017-05-23 | Disposition: A | Discharge status: Home / Self Care | Payer: Medicare Other | Attending: Emergency Medicine | Admitting: Emergency Medicine

## 2017-05-23 DIAGNOSIS — Z79899 Other long term (current) drug therapy: Secondary | ICD-10-CM | POA: Insufficient documentation

## 2017-05-23 DIAGNOSIS — Z8546 Personal history of malignant neoplasm of prostate: Secondary | ICD-10-CM | POA: Diagnosis not present

## 2017-05-23 DIAGNOSIS — N189 Chronic kidney disease, unspecified: Secondary | ICD-10-CM | POA: Diagnosis not present

## 2017-05-23 DIAGNOSIS — Y92002 Bathroom of unspecified non-institutional (private) residence single-family (private) house as the place of occurrence of the external cause: Secondary | ICD-10-CM | POA: Diagnosis not present

## 2017-05-23 DIAGNOSIS — Y999 Unspecified external cause status: Secondary | ICD-10-CM | POA: Diagnosis not present

## 2017-05-23 DIAGNOSIS — S8992XA Unspecified injury of left lower leg, initial encounter: Secondary | ICD-10-CM | POA: Diagnosis present

## 2017-05-23 DIAGNOSIS — Z91010 Allergy to peanuts: Secondary | ICD-10-CM | POA: Diagnosis not present

## 2017-05-23 DIAGNOSIS — Y939 Activity, unspecified: Secondary | ICD-10-CM | POA: Insufficient documentation

## 2017-05-23 DIAGNOSIS — W19XXXA Unspecified fall, initial encounter: Secondary | ICD-10-CM | POA: Insufficient documentation

## 2017-05-23 DIAGNOSIS — M25562 Pain in left knee: Secondary | ICD-10-CM | POA: Diagnosis not present

## 2017-05-23 MED ORDER — OXYCODONE-ACETAMINOPHEN 5-325 MG PO TABS
2.0000 | ORAL_TABLET | Freq: Once | ORAL | Status: DC
Start: 2017-05-23 — End: 2017-05-23

## 2017-05-23 MED ORDER — OXYCODONE-ACETAMINOPHEN 5-325 MG PO TABS
1.0000 | ORAL_TABLET | Freq: Once | ORAL | Status: AC
  Administered 2017-05-23: 1 via ORAL
  Filled 2017-05-23: qty 1

## 2017-05-23 NOTE — ED Notes (Signed)
Pt's family states the pt took 2 10mg  Oxymorphones at approximately 6 am this morning and is concerned about adding more medicine

## 2017-05-23 NOTE — ED Provider Notes (Signed)
[place] DEPT Provider Note   CSN: [id number] Arrival date & time: 05/23/17  1052     History   Chief Complaint Chief Complaint  Patient presents with  . Knee Pain    HPI Elier Zellars is a 83 y.o. male.  The history is provided by the patient.  Knee Pain   This is a new problem. The current episode started 6 to 12 hours ago. The problem occurs constantly. The problem has not changed since onset.The pain is present in the left knee and left lower leg. The quality of the pain is described as constant. The pain is severe. Associated symptoms include limited range of motion and stiffness. Pertinent negatives include no numbness and no tingling. The symptoms are aggravated by standing and activity. The treatment provided mild relief. There has been a history of trauma.   83 year old male who presents with left knee and leg pain after mechanical fall today. States that he was turning around in the bathroom while getting ready, lost balance and fell on the left buttock and left knee. He did not hit his head or have loss of consciousness. States that he was unable to get up by himself due to pain in the left knee. No other injuries, denies neck pain, back pain, chest pain, abdominal pain, numbness or weakness, headache, nausea or vomiting. Denies any anticoagulation.  Past Medical History:  Diagnosis Date  . Asthma    AS A CHILD  . Blind left eye   . Cancer [facility])    PROSTATE CANCER  . Chronic kidney disease   . Chronic pain syndrome   . Constipation   . DDD (degenerative disc disease), lumbar   . Insomnia   . Kyphoscoliosis   . Restless leg     Patient Active Problem List   Diagnosis Date Noted  . Chest pain 07/29/2015  . OSA on CPAP 07/10/2015  . CPAP use counseling 07/10/2015  . Excessive daytime sleepiness 02/22/2015  . Sleep apnea 02/22/2015  . Snoring 02/22/2015  . Nocturia more than twice per night 02/22/2015  . Syncope 12/31/2012  . Bifascicular bundle branch  block 12/31/2012    Class: Acute  . Cellulitis and abscess of leg, except foot 12/04/2012  . Chronic pain 12/04/2012  . Kyphosis 12/04/2012  . H/O unilateral nephrectomy 12/04/2012  . H/O prostate cancer 12/04/2012  . S/P TURP 12/04/2012    Past Surgical History:  Procedure Laterality Date  . CHOLECYSTECTOMY    . PROSTATECTOMY    . RIGHT CATARACT EXTRACTION  2013  . RIGTH NEPHRECTOMY  2006  . TONSILLECTOMY    . TRANSURETHRAL RESECTION OF PROSTATE  1997       Home Medications    Prior to Admission medications   Medication Sig Start Date End Date Taking? Authorizing Provider  gabapentin (NEURONTIN) 300 MG capsule Take 300 mg by mouth 2 (two) times daily.   Yes [provider]  mirtazapine (REMERON) 15 MG tablet Take 15 mg by mouth at bedtime.   Yes [provider]  oxymorphone (OPANA) 10 MG tablet Take 20 mg by mouth 2 (two) times daily as needed for pain.  01/30/16  Yes [provider]  polyethylene glycol (MIRALAX / GLYCOLAX) packet Take 17 g by mouth 2 (two) times daily. 08/28/16  Yes [name], MD  triamcinolone ointment (KENALOG) 0.1 % Apply 1 application topically daily as needed for itching. 07/23/16  Yes [provider]    Family History Family History  Problem Relation Age of  Onset  . Bone cancer Mother   . Hypertension Mother   . Arthritis Mother     Social History Social History  Substance Use Topics  . Smoking status: Never Smoker  . Smokeless tobacco: Never Used  . Alcohol use No     Comment: QUIT ALCOHOL AT AGE 89     Allergies   Peanuts [peanut oil] and Coconut oil   Review of Systems Review of Systems  Constitutional: Negative for fever.  Respiratory: Negative for shortness of breath.   Cardiovascular: Negative for chest pain.  Gastrointestinal: Negative for abdominal pain.  Musculoskeletal: Positive for stiffness. Negative for back pain and neck pain.       Left knee and lower leg pain     Allergic/Immunologic: Negative for immunocompromised state.  Neurological: Negative for tingling, weakness and numbness.  Hematological: Does not bruise/bleed easily.  Psychiatric/Behavioral: Negative for confusion.  All other systems reviewed and are negative.    Physical Exam Updated Vital Signs BP 125/72 (BP Location: Right Arm)   Pulse 80   Temp 98.3 F (36.8 C) (Oral)   Resp 18   SpO2 97%   Physical Exam Physical Exam  Nursing note and vitals reviewed. Constitutional: Well developed, well nourished, non-toxic, and in no acute distress Head: Normocephalic and atraumatic.  Mouth/Throat: Oropharynx is clear and moist.  Neck: Normal range of motion. Neck supple.  Cardiovascular: Normal rate and regular rhythm.   Pulmonary/Chest: Effort normal and breath sounds normal.  Abdominal: Soft. There is no tenderness. There is no rebound and no guarding.  Musculoskeletal: Left knee held in flexion, with limited extension due to pain. No significant deformities or swelling.  Neurological: Alert, no facial droop, fluent speech, moves all extremities symmetrically Skin: Skin is warm and dry. Old skin tear in the left inner thigh, mild serous drainage, no purulence. No significant erythema, warmth or induration.  Psychiatric: Cooperative   ED Treatments / Results  Labs (all labs ordered are listed, but only abnormal results are displayed) Labs Reviewed - No data to display  EKG  EKG Interpretation None       Radiology Dg Tibia/fibula Left  Result Date: 05/23/2017 CLINICAL DATA:  83 year old male with a history of fall EXAM: LEFT TIBIA AND FIBULA - 2 VIEW COMPARISON:  05/23/2017 FINDINGS: No acute displaced fracture. Mild soft tissue swelling on the lateral aspect of the lower leg, nonspecific. Dense calcifications of the tibial vasculature. Knee joint and ankle joint appear congruent. No radiopaque foreign body. IMPRESSION: No acute bony abnormality. Nonspecific soft tissue  swelling on the lateral aspect of the lower leg. Tibial vascular calcifications. Electronically Signed   By: Corrie Mckusick D.O.   On: 05/23/2017 12:08   Dg Knee Complete 4 Views Left  Result Date: 05/23/2017 CLINICAL DATA:  Fall. EXAM: LEFT KNEE - COMPLETE 4+ VIEW COMPARISON:  12/04/2012. FINDINGS: Degenerative changes left knee. Loose bodies appear to be present. Similar findings noted on prior exam. No evidence of fracture or dislocation. Suprapatellar soft tissue swelling noted. Small effusion cannot be excluded. Peripheral vascular calcification . IMPRESSION: 1. Suprapatellar soft tissue swelling. Small effusion cannot be excluded. 2. Diffuse degenerative change. Loose bodies appear to be present . Similar findings noted on prior exam . No evidence of fracture or dislocation. Electronically Signed   By: Marcello Moores  Register   On: 05/23/2017 12:09    Procedures Procedures (including critical care time)  Medications Ordered in ED Medications  oxyCODONE-acetaminophen (PERCOCET/ROXICET) 5-325 MG per tablet 1 tablet (1 tablet Oral  Given 05/23/17 1203)     Initial Impression / Assessment and Plan / ED Course  I have reviewed the triage vital signs and the nursing notes.  Pertinent labs & imaging results that were available during my care of the patient were reviewed by me and considered in my medical decision making (see chart for details).     83 year old male who presents after mechanical fall onto left knee. No head injury on exam or by history. Normal ROM of hip, but limited ROM of left knee due to pain. Extremity NV in tact. Xrays visualized, and shows severe OA but no fracture. Discussed with patient's son. States he has had a lot of issues with the left knee and seen by Dr. Lyla Glassing who also felt that left knee pain also from weakened muscles of the left thigh. PT was ordered but patient has yet to participate. Son is requesting more help at home for patient at his independent living facility,  eventually expressing that he would also like patient to eventually go to rehab facility or skilled nursing facility. I have spoken with both CM and SW. He qualifies for rehab but there will be difficulty getting him placed over weekend. Son and patient agreeable to have home health with SW who will assist with placement from home. Strict return and follow-up instructions reviewed. Patient and son expressed understanding of all discharge instructions and felt comfortable with the plan of care.   Final Clinical Impressions(s) / ED Diagnoses   Final diagnoses:  Acute pain of left knee    New Prescriptions Discharge Medication List as of 05/23/2017  1:56 PM       Forde Dandy, MD 05/23/17 1700

## 2017-05-23 NOTE — Progress Notes (Signed)
PT Cancellation Note  Patient Details Name: Tavin Vernet MRN: [id number] DOB: 06/03/1926   Cancelled Treatment:    Reason Eval/Treat Not Completed: Spoke with MD-PT eval not needed. Will sign off. Thanks.    Weston Anna, MPT Pager: [phone]

## 2017-05-23 NOTE — Discharge Instructions (Signed)
Ice and elevate your leg at rest Take your home opana for pain. Return for worsening symptoms, including fever, confusion, escalating pain, recurrent falls, or any other symptoms concerning to you.  Please call Dr. Lyla Glassing for follow-up. Our case manager as arranged home health for you, which should start over the next 1-3 days.

## 2017-05-23 NOTE — Progress Notes (Signed)
CSW spoke with patients son, [name], briefly as exiting the ED regarding discharge plans. Son stated family would pay privately for additional care while [facility] services were not at home and while family were working. Son did not have any questions or concerns and stated he would follow up with patients PCP which is with [facility].   Kingsley Spittle, [place] Worker [phone]

## 2017-05-23 NOTE — ED Notes (Signed)
Bed: WA06 Expected date:  Expected time:  Means of arrival:  Comments: EMS knee pain 

## 2017-05-23 NOTE — ED Triage Notes (Signed)
Per EMS-states he was suppose to go to PT r/t left knee pain/stiffness-states he fell this am onto left side-no LOC-complaining of left knee pain-no overt deformity

## 2017-05-24 ENCOUNTER — Encounter (HOSPITAL_COMMUNITY): Payer: Self-pay | Admitting: Emergency Medicine

## 2017-05-24 ENCOUNTER — Emergency Department (HOSPITAL_COMMUNITY)
Admission: EM | Admit: 2017-05-24 | Discharge: 2017-05-25 | Disposition: A | Discharge status: Home / Self Care | Payer: Medicare Other | Attending: Emergency Medicine | Admitting: Emergency Medicine

## 2017-05-24 DIAGNOSIS — M25561 Pain in right knee: Secondary | ICD-10-CM | POA: Diagnosis present

## 2017-05-24 DIAGNOSIS — Z91010 Allergy to peanuts: Secondary | ICD-10-CM | POA: Diagnosis not present

## 2017-05-24 DIAGNOSIS — D72828 Other elevated white blood cell count: Secondary | ICD-10-CM | POA: Diagnosis not present

## 2017-05-24 DIAGNOSIS — Z79899 Other long term (current) drug therapy: Secondary | ICD-10-CM | POA: Diagnosis not present

## 2017-05-24 DIAGNOSIS — Z8546 Personal history of malignant neoplasm of prostate: Secondary | ICD-10-CM | POA: Insufficient documentation

## 2017-05-24 DIAGNOSIS — N189 Chronic kidney disease, unspecified: Secondary | ICD-10-CM | POA: Insufficient documentation

## 2017-05-24 DIAGNOSIS — L03115 Cellulitis of right lower limb: Secondary | ICD-10-CM | POA: Insufficient documentation

## 2017-05-24 DIAGNOSIS — J45909 Unspecified asthma, uncomplicated: Secondary | ICD-10-CM | POA: Diagnosis not present

## 2017-05-24 LAB — COMPREHENSIVE METABOLIC PANEL
ALBUMIN: 3.7 g/dL (ref 3.5–5.0)
ALT: 34 U/L (ref 17–63)
ANION GAP: 6 (ref 5–15)
AST: 31 U/L (ref 15–41)
Alkaline Phosphatase: 85 U/L (ref 38–126)
BILIRUBIN TOTAL: 0.9 mg/dL (ref 0.3–1.2)
BUN: 25 mg/dL — ABNORMAL HIGH (ref 6–20)
CALCIUM: 9 mg/dL (ref 8.9–10.3)
CO2: 26 mmol/L (ref 22–32)
Chloride: 106 mmol/L (ref 101–111)
Creatinine, Ser: 1.03 mg/dL (ref 0.61–1.24)
GFR calc non Af Amer: >60 mL/min (ref >60)
Glucose, Bld: 108 mg/dL — ABNORMAL HIGH (ref 65–99)
POTASSIUM: 4.2 mmol/L (ref 3.5–5.1)
Sodium: 138 mmol/L (ref 135–145)
TOTAL PROTEIN: 6.5 g/dL (ref 6.5–8.1)

## 2017-05-24 LAB — CBC WITH DIFFERENTIAL/PLATELET
BASOS PCT: 0 %
Basophils Absolute: 0 10*3/uL (ref 0.0–0.1)
Eosinophils Absolute: 0.2 10*3/uL (ref 0.0–0.7)
Eosinophils Relative: 1 %
HEMATOCRIT: 44.6 % (ref 39.0–52.0)
HEMOGLOBIN: 14.7 g/dL (ref 13.0–17.0)
LYMPHS PCT: 18 %
Lymphs Abs: 2.1 10*3/uL (ref 0.7–4.0)
MCH: 29.8 pg (ref 26.0–34.0)
MCHC: 33 g/dL (ref 30.0–36.0)
MCV: 90.5 fL (ref 78.0–100.0)
MONO ABS: 1.4 10*3/uL — AB (ref 0.1–1.0)
Monocytes Relative: 12 %
NEUTROS ABS: 8 10*3/uL — AB (ref 1.7–7.7)
NEUTROS PCT: 69 %
Platelets: 167 10*3/uL (ref 150–400)
RBC: 4.93 MIL/uL (ref 4.22–5.81)
RDW: 13.3 % (ref 11.5–15.5)
WBC: 11.7 10*3/uL — AB (ref 4.0–10.5)

## 2017-05-24 LAB — SYNOVIAL CELL COUNT + DIFF, W/ CRYSTALS
CRYSTALS FLUID: NONE SEEN
LYMPHOCYTES-SYNOVIAL FLD: 31 % — AB (ref 0–20)
Monocyte-Macrophage-Synovial Fluid: 12 % — ABNORMAL LOW (ref 50–90)
Neutrophil, Synovial: 57 % — ABNORMAL HIGH (ref 0–25)
Other Cells-SYN: 5
WBC, Synovial: 2000 /mm3 — ABNORMAL HIGH (ref 0–200)

## 2017-05-24 LAB — C-REACTIVE PROTEIN: CRP: 4.5 mg/dL — AB (ref <1.0)

## 2017-05-24 LAB — SEDIMENTATION RATE: SED RATE: 10 mm/h (ref 0–16)

## 2017-05-24 LAB — PROTIME-INR
INR: 1.06
Prothrombin Time: 13.9 seconds (ref 11.4–15.2)

## 2017-05-24 LAB — I-STAT CG4 LACTIC ACID, ED: Lactic Acid, Venous: 0.88 mmol/L (ref 0.5–1.9)

## 2017-05-24 MED ORDER — MIRTAZAPINE 15 MG PO TABS
15.0000 mg | ORAL_TABLET | Freq: Every day | ORAL | Status: DC
  Administered 2017-05-24: 15 mg via ORAL
  Filled 2017-05-24: qty 1

## 2017-05-24 MED ORDER — HYDROMORPHONE HCL 1 MG/ML IJ SOLN
0.5000 mg | Freq: Once | INTRAMUSCULAR | Status: AC
  Administered 2017-05-24: 0.5 mg via INTRAVENOUS
  Filled 2017-05-24: qty 1

## 2017-05-24 MED ORDER — OXYCODONE-ACETAMINOPHEN 5-325 MG PO TABS
1.0000 | ORAL_TABLET | Freq: Four times a day (QID) | ORAL | Status: DC | PRN
  Administered 2017-05-25: 2 via ORAL
  Filled 2017-05-24: qty 2

## 2017-05-24 MED ORDER — SULFAMETHOXAZOLE-TRIMETHOPRIM 800-160 MG PO TABS: 1.0000 | ORAL_TABLET | Freq: Two times a day (BID) | ORAL | 0 refills | Status: AC

## 2017-05-24 MED ORDER — CEPHALEXIN 250 MG PO CAPS
500.0000 mg | ORAL_CAPSULE | Freq: Three times a day (TID) | ORAL | Status: DC
  Administered 2017-05-24 – 2017-05-25 (×3): 500 mg via ORAL
  Filled 2017-05-24 (×3): qty 2

## 2017-05-24 MED ORDER — SULFAMETHOXAZOLE-TRIMETHOPRIM 800-160 MG PO TABS
1.0000 | ORAL_TABLET | Freq: Two times a day (BID) | ORAL | Status: DC
  Administered 2017-05-24 – 2017-05-25 (×2): 1 via ORAL
  Filled 2017-05-24 (×2): qty 1

## 2017-05-24 MED ORDER — VANCOMYCIN HCL IN DEXTROSE 1-5 GM/200ML-% IV SOLN
1000.0000 mg | Freq: Once | INTRAVENOUS | Status: AC
  Administered 2017-05-24: 1000 mg via INTRAVENOUS
  Filled 2017-05-24: qty 200

## 2017-05-24 MED ORDER — GABAPENTIN 300 MG PO CAPS
300.0000 mg | ORAL_CAPSULE | Freq: Two times a day (BID) | ORAL | Status: DC
  Administered 2017-05-24 – 2017-05-25 (×2): 300 mg via ORAL
  Filled 2017-05-24 (×2): qty 1

## 2017-05-24 MED ORDER — CEPHALEXIN 500 MG PO CAPS: 500.0000 mg | ORAL_CAPSULE | Freq: Four times a day (QID) | ORAL | 0 refills | Status: AC

## 2017-05-24 MED ORDER — POLYETHYLENE GLYCOL 3350 17 G PO PACK
17.0000 g | PACK | Freq: Two times a day (BID) | ORAL | Status: DC
  Administered 2017-05-24: 17 g via ORAL
  Filled 2017-05-24 (×2): qty 1

## 2017-05-24 MED ORDER — VANCOMYCIN HCL IN DEXTROSE 750-5 MG/150ML-% IV SOLN: 750.0000 mg | INTRAVENOUS | Status: DC

## 2017-05-24 MED ORDER — LIDOCAINE-EPINEPHRINE (PF) 2 %-1:200000 IJ SOLN
10.0000 mL | Freq: Once | INTRAMUSCULAR | Status: AC
  Administered 2017-05-24: 10 mL via INTRADERMAL
  Filled 2017-05-24: qty 20

## 2017-05-24 NOTE — Progress Notes (Signed)
Pharmacy Antibiotic Note  Nicola Quesnell is a 83 y.o. male admitted on 05/24/2017 with left knee cellulitis. Pharmacy has been consulted for vancomycin dosing. Renal function wnl.  Vancomycin trough goal 10-15  Plan: 1) Vancomycin 1g IV x 1 then 750mg  IV q24 2) Follow renal function, cultures, LOT, level if needed  Height: 6\' 1"  (185.4 cm) Weight: 152 lb (68.9 kg) IBW/kg (Calculated) : 79.9  Temp (24hrs), Avg:99.1 F (37.3 C), Min:98.7 F (37.1 C), Max:99.5 F (37.5 C)   Recent Labs Lab 05/24/17 1631 05/24/17 1643  WBC 11.7*  --   CREATININE 1.03  --   LATICACIDVEN  --  0.88    Estimated Creatinine Clearance: 48.3 mL/min (by C-G formula based on SCr of 1.03 mg/dL).    Allergies  Allergen Reactions  . Peanuts [Peanut Oil] Shortness Of Breath    Shortness of breath---all nuts, but can have peanuts, cashews, and almonds  . Coconut Oil     Unknown childhood reaction.      Antimicrobials this admission: 6/23 Vancomycin >>  Dose adjustments this admission: n/a  Microbiology results: None yet  Thank you for allowing pharmacy to be a part of this patient's care.  Deboraha Sprang 05/24/2017 7:50 PM

## 2017-05-24 NOTE — ED Provider Notes (Signed)
[place] DEPT Provider Note   CSN: [id number] Arrival date & time: 05/24/17  1604     History   Chief Complaint Chief Complaint  Patient presents with  . Knee Pain    HPI Casey Gates is a 83 y.o. male.  HPI   83 yo M with PMHx chronic pain, CKD here with left knee pain. Pt has a h/o chronic knee pain and was seen yesterday for worsening knee pain. Plain films were unremarkable at that time and he was sent home with supportive care. Over the past 24 hours, pt states his knee pain has worsened and he now has increasing pain that is throbbing. He is unable to put any weight on his leg. He reports associated warmth, swelling, and pain that is progressively worsening. He has associated fevers and chills at home (T 100 orally). No recent trauma to the knee. Pain 10/10 in severity.  Past Medical History:  Diagnosis Date  . Asthma    AS A CHILD  . Blind left eye   . Cancer [facility])    PROSTATE CANCER  . Chronic kidney disease   . Chronic pain syndrome   . Constipation   . DDD (degenerative disc disease), lumbar   . Insomnia   . Kyphoscoliosis   . Restless leg     Patient Active Problem List   Diagnosis Date Noted  . Chest pain 07/29/2015  . OSA on CPAP 07/10/2015  . CPAP use counseling 07/10/2015  . Excessive daytime sleepiness 02/22/2015  . Sleep apnea 02/22/2015  . Snoring 02/22/2015  . Nocturia more than twice per night 02/22/2015  . Syncope 12/31/2012  . Bifascicular bundle branch block 12/31/2012    Class: Acute  . Cellulitis and abscess of leg, except foot 12/04/2012  . Chronic pain 12/04/2012  . Kyphosis 12/04/2012  . H/O unilateral nephrectomy 12/04/2012  . H/O prostate cancer 12/04/2012  . S/P TURP 12/04/2012    Past Surgical History:  Procedure Laterality Date  . CHOLECYSTECTOMY    . PROSTATECTOMY    . RIGHT CATARACT EXTRACTION  2013  . RIGTH NEPHRECTOMY  2006  . TONSILLECTOMY    . TRANSURETHRAL RESECTION OF PROSTATE  1997       Home  Medications    Prior to Admission medications   Medication Sig Start Date End Date Taking? Authorizing Provider  gabapentin (NEURONTIN) 300 MG capsule Take 300 mg by mouth 2 (two) times daily.    [provider]  mirtazapine (REMERON) 15 MG tablet Take 15 mg by mouth at bedtime.    [provider]  oxymorphone (OPANA) 10 MG tablet Take 20 mg by mouth 2 (two) times daily as needed for pain.  01/30/16   [provider]  polyethylene glycol (MIRALAX / GLYCOLAX) packet Take 17 g by mouth 2 (two) times daily. 08/28/16   [name], MD  triamcinolone ointment (KENALOG) 0.1 % Apply 1 application topically daily as needed for itching. 07/23/16   [provider]    Family History Family History  Problem Relation Age of Onset  . Bone cancer Mother   . Hypertension Mother   . Arthritis Mother     Social History Social History  Substance Use Topics  . Smoking status: Never Smoker  . Smokeless tobacco: Never Used  . Alcohol use No     Comment: QUIT ALCOHOL AT AGE 72     Allergies   Peanuts [peanut oil] and Coconut oil   Review of Systems Review of Systems  Constitutional:  Positive for fatigue.  Musculoskeletal: Positive for arthralgias and joint swelling.  Skin: Positive for rash.  All other systems reviewed and are negative.    Physical Exam Updated Vital Signs BP 129/66   Pulse 77   Temp 99.4 F (37.4 C) (Rectal)   Resp 16   Ht 6\' 1"  (1.854 m)   Wt 68.9 kg (152 lb)   SpO2 97%   BMI 20.05 kg/m   Physical Exam  Constitutional: He is oriented to person, place, and time. He appears well-developed and well-nourished. No distress.  HENT:  Head: Normocephalic and atraumatic.  Eyes: Conjunctivae are normal.  Neck: Neck supple.  Cardiovascular: Normal rate, regular rhythm and normal heart sounds.   Pulmonary/Chest: Effort normal. No respiratory distress. He has no wheezes.  Abdominal: He exhibits no distension.  Musculoskeletal: He  exhibits no edema.  Neurological: He is alert and oriented to person, place, and time. He exhibits normal muscle tone.  Skin: Skin is warm. Capillary refill takes less than 2 seconds. No rash noted.  Nursing note and vitals reviewed.   LOWER EXTREMITY EXAM: LEFT  INSPECTION & PALPATION: No gross deformity.  Moderate swelling of knee with exquisite TTP with any pROM Unable to put any weight on knee Moderate erythema extending from chronic wound on medial thigh to knee No drainage  SENSORY: sensation is intact to light touch in:  Superficial peroneal nerve distribution (over dorsum of foot) Deep peroneal nerve distribution (over first dorsal web space) Sural nerve distribution (over lateral aspect 5th metatarsal) Saphenous nerve distribution (over medial instep)  MOTOR:  + Motor EHL (great toe dorsiflexion) + FHL (great toe plantar flexion)  + TA (ankle dorsiflexion)  + GSC (ankle plantar flexion)  VASCULAR: 2+ dorsalis pedis and posterior tibialis pulses Capillary refill < 2 sec, toes warm and well-perfused  COMPARTMENTS: Soft, warm, well-perfused No pain with passive extension No parethesias   ED Treatments / Results  Labs (all labs ordered are listed, but only abnormal results are displayed) Labs Reviewed  COMPREHENSIVE METABOLIC PANEL - Abnormal; Notable for the following:       Result Value   Glucose, Bld 108 (*)    BUN 25 (*)    All other components within normal limits  CBC WITH DIFFERENTIAL/PLATELET - Abnormal; Notable for the following:    WBC 11.7 (*)    Neutro Abs 8.0 (*)    Monocytes Absolute 1.4 (*)    All other components within normal limits  C-REACTIVE PROTEIN - Abnormal; Notable for the following:    CRP 4.5 (*)    All other components within normal limits  SYNOVIAL CELL COUNT + DIFF, W/ CRYSTALS - Abnormal; Notable for the following:    Color, Synovial RED (*)    Appearance-Synovial TURBID (*)    WBC, Synovial 2,000 (*)    Neutrophil, Synovial  57 (*)    Lymphocytes-Synovial Fld 31 (*)    Monocyte-Macrophage-Synovial Fluid 12 (*)    All other components within normal limits  BODY FLUID CULTURE  ANAEROBIC CULTURE  CULTURE, BLOOD (ROUTINE X 2)  CULTURE, BLOOD (ROUTINE X 2)  PROTIME-INR  SEDIMENTATION RATE  I-STAT CG4 LACTIC ACID, ED    EKG  EKG Interpretation None       Radiology Dg Tibia/fibula Left  Result Date: 05/23/2017 CLINICAL DATA:  83 year old male with a history of fall EXAM: LEFT TIBIA AND FIBULA - 2 VIEW COMPARISON:  05/23/2017 FINDINGS: No acute displaced fracture. Mild soft tissue swelling on the lateral aspect of the  lower leg, nonspecific. Dense calcifications of the tibial vasculature. Knee joint and ankle joint appear congruent. No radiopaque foreign body. IMPRESSION: No acute bony abnormality. Nonspecific soft tissue swelling on the lateral aspect of the lower leg. Tibial vascular calcifications. Electronically Signed   By: Corrie Mckusick D.O.   On: 05/23/2017 12:08   Dg Knee Complete 4 Views Left  Result Date: 05/23/2017 CLINICAL DATA:  Fall. EXAM: LEFT KNEE - COMPLETE 4+ VIEW COMPARISON:  12/04/2012. FINDINGS: Degenerative changes left knee. Loose bodies appear to be present. Similar findings noted on prior exam. No evidence of fracture or dislocation. Suprapatellar soft tissue swelling noted. Small effusion cannot be excluded. Peripheral vascular calcification . IMPRESSION: 1. Suprapatellar soft tissue swelling. Small effusion cannot be excluded. 2. Diffuse degenerative change. Loose bodies appear to be present . Similar findings noted on prior exam . No evidence of fracture or dislocation. Electronically Signed   By: Marcello Moores  Register   On: 05/23/2017 12:09    Procedures .Joint Aspiration/Arthrocentesis Date/Time: 05/24/2017 6:15 PM Performed by: Duffy Bruce Authorized by: Duffy Bruce   Consent:    Consent obtained:  Verbal   Consent given by:  Patient   Risks discussed:  Bleeding, incomplete  drainage, infection, nerve damage, pain and poor cosmetic result   Alternatives discussed:  Delayed treatment, alternative treatment and observation Location:    Location:  Knee   Knee:  R knee Anesthesia (see MAR for exact dosages):    Anesthesia method:  Local infiltration   Local anesthetic:  Lidocaine 1% WITH epi Procedure details:    Preparation: Patient was prepped and draped in usual sterile fashion     Needle gauge:  20 G   Approach:  Lateral   Aspirate amount:  10   Aspirate characteristics:  Blood-tinged   Steroid injected: no     Specimen collected: yes   Post-procedure details:    Dressing:  Adhesive bandage   Patient tolerance of procedure:  Tolerated well, no immediate complications Comments:     Patient was prepped and draped in usual sterile fashion. A location along the medial inferior knee was chosen in an area that did not contain overlying skin cellulitis/redness.     (including critical care time)  Medications Ordered in ED Medications  gabapentin (NEURONTIN) capsule 300 mg (300 mg Oral Given 05/24/17 2259)  mirtazapine (REMERON) tablet 15 mg (15 mg Oral Given 05/24/17 2259)  polyethylene glycol (MIRALAX / GLYCOLAX) packet 17 g (not administered)  oxyCODONE-acetaminophen (PERCOCET/ROXICET) 5-325 MG per tablet 1-2 tablet (not administered)  cephALEXin (KEFLEX) capsule 500 mg (500 mg Oral Given 05/24/17 2259)  sulfamethoxazole-trimethoprim (BACTRIM DS,SEPTRA DS) 800-160 MG per tablet 1 tablet (1 tablet Oral Given 05/24/17 2258)  HYDROmorphone (DILAUDID) injection 0.5 mg (0.5 mg Intravenous Given 05/24/17 1824)  lidocaine-EPINEPHrine (XYLOCAINE W/EPI) 2 %-1:200000 (PF) injection 10 mL (10 mLs Intradermal Given by Other 05/24/17 1823)  vancomycin (VANCOCIN) IVPB 1000 mg/200 mL premix (0 mg Intravenous Stopped 05/24/17 2210)     Initial Impression / Assessment and Plan / ED Course  I have reviewed the triage vital signs and the nursing notes.  Pertinent labs &  imaging results that were available during my care of the patient were reviewed by me and considered in my medical decision making (see chart for details).     83 yo M here with left knee and leg pain and swelling. Exam is c/w superficial cellulitis, also with concern for knee effusion and possible septic arthritis. Knee aspiration performed and fortunately shows no  septic arthritis. However, pt does have significant leukocytosis, worsening pain, and difficulty ambulating. Started pt on Vancomycin and called [facility], Dr. Myna Hidalgo, for admission. Given lack of fever ( T 100.35F at home) or signs of sepsis and recent visit for same with outpt resources arranged, he does not feel admission is required and will not admit pt. On my repeat assessment, pain improving but pt has no assistance at home (his aid left), he has persistent difficulty walking, lives alone, and does not feel safe returning home at this time. Given that inpt team will not admit, will keep in Er for monitoring, initiation of oral ABX, and plan for CM/SW discussion in Am to ensure that pt family has arranged appropriate care for him at home to be discharged tomorrow.   Final Clinical Impressions(s) / ED Diagnoses   Final diagnoses:  Cellulitis of right leg  Other elevated white blood cell (WBC) count    New Prescriptions New Prescriptions   No medications on file     Duffy Bruce, MD 05/24/17 2300

## 2017-05-24 NOTE — ED Triage Notes (Signed)
Pt returning to ER from [facility] independent living for evaluation of left knee redness, swelling and pain that he was seen for yesterday. States took temperature at home and reading was 100.0. Left medial thigh noted to have wound, states has been receiving care for this.

## 2017-05-24 NOTE — ED Notes (Signed)
Pt in gown and on monitor 

## 2017-05-24 NOTE — ED Notes (Signed)
ED Provider at bedside. 

## 2017-05-24 NOTE — ED Notes (Signed)
Talked with charge nurse Luiz Iron, RN that pt will be staying in ED tonight, awaiting social work consult in am.  Pt has no one at home to assist him at this time.

## 2017-05-25 MED ORDER — OXYMORPHONE HCL 10 MG PO TABS: 20.0000 mg | ORAL_TABLET | Freq: Two times a day (BID) | ORAL | 0 refills | Status: DC | PRN

## 2017-05-25 NOTE — Progress Notes (Signed)
CSW spoke to pt's son who verified pt no longer wears the CPAP machine and as such does not need Engineer, manufacturing, nor at the SNF.  CSW will update EDP and have FL-2 re-signed. And sent to [facility] which is pt's son's choice.  Alphonse Guild. Yanelli Zapanta, LCSWA, Deon Pilling, CSI Clinical Social Worker Ph: [phone]

## 2017-05-25 NOTE — Discharge Instructions (Addendum)
You have developed a superficial skin infection, or cellulitis  It is VERY important that you take the antibiotics as prescribed. These have been electronically sent to the [facility] on [address]. Start taking them today.  Continue your usual pain medications.

## 2017-05-25 NOTE — Progress Notes (Signed)
Spoke with Son at length to clarify history. Son very upset that MD had assured placement to [facility] on Friday pm and that brief encounter with CSW had not ended in placement. CM assured son the process involved more than just the MD order and that placement was more  involved and would require a two [place] IP stay for which pt did not and does not qualify. Pt did, however FAIL out pt trial with in home caregivers provided by [employer] and hired by son as he had promised. CM called Deirdre Pippins, MD [place] after learning these facts and received a LOG for 5 days to SNF. CM called Jonathon, CSW and shared this info with him as well as son and pt. Will make Liu, MD (covering in [place] E today) aware in hopes of gaining DC summary for CSW. Made CSW aware discharge would need to happen today. CM will sign off for now but will be available should additional discharge needs arise or disposition change.

## 2017-05-25 NOTE — ED Notes (Signed)
Pt took home meds 1 tablet of oxymorphone, approved by dr Regenia Skeeter to give pt 1 tablet.

## 2017-05-25 NOTE — ED Notes (Signed)
Per report, pt and RN attempted multiple times to reach family, phone numbers at bedside.

## 2017-05-25 NOTE — Evaluation (Signed)
Physical Therapy Evaluation Patient Details Name: Casey Gates MRN: [id number] DOB: 10-17-1926 Today's Date: 05/25/2017   History of Present Illness  Patient is an 83 yo male arriving in ED on 05/24/17 with increasing pain in Lt knee with swelling, redness, and warm to touch.  Patient s/p fall at ILF.    PMH:  chronic back pain, blind Lt eye, CKD, DDD, kyphscoliosis, RLS, OSA on CPAP  Clinical Impression  Patient presents with problems listed below.  Will benefit from acute PT to maximize functional mobility prior to d/c.  Patient typically independent with RW in his apartment (short distances). Was having falls pta.  Today, patient requiring moderate assistance to stand, transfer, and try to take steps.  Patient is not safe to be in ILF alone - would need 24 hour assist (transfer to [facility] at night).  Recommend SNF at discharge for continued therapy with goal to return to prior functional level.    Follow Up Recommendations SNF;Supervision/Assistance - 24 hour    Equipment Recommendations  Wheelchair (measurements PT);Wheelchair cushion (measurements PT)    Recommendations for Other Services       Precautions / Restrictions Precautions Precautions: Fall Precaution Comments: Multiple falls in ILF Restrictions Weight Bearing Restrictions: No      Mobility  Bed Mobility Overal bed mobility: Modified Independent             General bed mobility comments: Increased time.  Transfers Overall transfer level: Needs assistance Equipment used: Rolling walker (2 wheeled) Transfers: Sit to/from Stand Sit to Stand: Mod assist         General transfer comment: Verbal cues for hand placement.  Mod assist to power up to standing.  Decreased weight bearing on LLE.  Ambulation/Gait Ambulation/Gait assistance: Mod assist Ambulation Distance (Feet): 1 Feet Assistive device: Rolling walker (2 wheeled) Gait Pattern/deviations: Step-to pattern;Decreased stride length;Decreased stance time  - left;Decreased step length - right;Decreased weight shift to left;Shuffle;Antalgic;Trunk flexed     General Gait Details: Patient able to take 1 step forward and backward with RW.  Required mod assist to maintain balance, and offload Lt LE to step with Rt LE.  Patient fearful of LLE "giving out".  Stairs            Wheelchair Mobility    Modified Rankin (Stroke Patients Only)       Balance Overall balance assessment: Needs assistance         Standing balance support: Bilateral upper extremity supported Standing balance-Leahy Scale: Poor Standing balance comment: Heavy use of UE's on RW to maintain balance.                             Pertinent Vitals/Pain Pain Assessment: 0-10 Pain Score: 8  Pain Location: Lt knee in stance Pain Descriptors / Indicators: Aching;Sore Pain Intervention(s): Limited activity within patient's tolerance;Monitored during session;Repositioned;Ice applied    Home Living Family/patient expects to be discharged to:: Skilled nursing facility Living Arrangements: Alone Available Help at Discharge: Family;Available PRN/intermittently Type of Home: Independent living facility Home Access: Level entry;Elevator (Patient in 3rd floor apartment)     Home Layout: One level Home Equipment: Walker - 2 wheels;Walker - 4 wheels;Electric scooter      Prior Function Level of Independence: Independent with assistive device(s);Needs assistance   Gait / Transfers Assistance Needed: Prior to knee pain, patient using RW/Rollator in apartment, and electric scooter to go to meals in facility.  ADL's / Homemaking Assistance Needed: Patient  takes sponge baths.  Facility provides meals.        Hand Dominance        Extremity/Trunk Assessment   Upper Extremity Assessment Upper Extremity Assessment: Generalized weakness    Lower Extremity Assessment Lower Extremity Assessment: Generalized weakness;LLE deficits/detail LLE Deficits /  Details: Decreased ROM in knee with -25* extension;  Strength decrease to 4/5 at knee due to pain.  Noted wound on Lt thigh with redness around it.  Redness, swelling, and warmth to Lt knee and lower thigh. LLE: Unable to fully assess due to pain    Cervical / Trunk Assessment Cervical / Trunk Assessment: Kyphotic (Scoliosis)  Communication   Communication: No difficulties  Cognition Arousal/Alertness: Awake/alert ("Sleepy" but easy to arouse.) Behavior During Therapy: Anxious;Flat affect Overall Cognitive Status: No family/caregiver present to determine baseline cognitive functioning                                 General Comments: Decreased short term memory.        General Comments      Exercises     Assessment/Plan    PT Assessment Patient needs continued PT services  PT Problem List Decreased strength;Decreased range of motion;Decreased activity tolerance;Decreased balance;Decreased mobility;Decreased knowledge of use of DME;Pain;Decreased skin integrity       PT Treatment Interventions DME instruction;Gait training;Therapeutic activities;Functional mobility training;Therapeutic exercise;Patient/family education    PT Goals (Current goals can be found in the Care Plan section)  Acute Rehab PT Goals Patient Stated Goal: To decrease pain PT Goal Formulation: With patient Time For Goal Achievement: 06/01/17 Potential to Achieve Goals: Fair    Frequency Min 3X/week   Barriers to discharge Decreased caregiver support Patient lives alone.    Co-evaluation               AM-PAC PT "6 Clicks" Daily Activity  Outcome Measure Difficulty turning over in bed (including adjusting bedclothes, sheets and blankets)?: A Little Difficulty moving from lying on back to sitting on the side of the bed? : A Little Difficulty sitting down on and standing up from a chair with arms (e.g., wheelchair, bedside commode, etc,.)?: A Lot Help needed moving to and from a  bed to chair (including a wheelchair)?: A Lot Help needed walking in hospital room?: A Lot Help needed climbing 3-5 steps with a railing? : A Lot 6 Click Score: 14    End of Session Equipment Utilized During Treatment: Gait belt Activity Tolerance: Patient limited by pain Patient left: in bed;with call bell/phone within reach Nurse Communication: Mobility status PT Visit Diagnosis: Unsteadiness on feet (R26.81);Other abnormalities of gait and mobility (R26.89);Repeated falls (R29.6);Muscle weakness (generalized) (M62.81);Pain Pain - Right/Left: Left Pain - part of body: Knee    Time: 8657-8469 PT Time Calculation (min) (ACUTE ONLY): 31 min   Charges:   PT Evaluation $PT Eval Moderate Complexity: 1 Procedure PT Treatments $Therapeutic Activity: 8-22 mins   PT G Codes:   PT G-Codes **NOT FOR INPATIENT CLASS** Functional Assessment Tool Used: AM-PAC 6 Clicks Basic Mobility Functional Limitation: Mobility: Walking and moving around Mobility: Walking and Moving Around Current Status (G2952): At least 40 percent but less than 60 percent impaired, limited or restricted Mobility: Walking and Moving Around Goal Status [phone]): At least 1 percent but less than 20 percent impaired, limited or restricted    Carita Pian. Sanjuana Kava, [facility] Acute Rehab Services Pager [place]  05/25/2017, 11:16 AM

## 2017-05-25 NOTE — Clinical Social Work Placement (Signed)
   CLINICAL SOCIAL WORK PLACEMENT  NOTE  Date:  05/25/2017  Patient Details  Name: Casey Gates MRN: [id number] Date of Birth: 1926/09/09  Clinical Social Work is seeking post-discharge placement for this patient at the [place] level of care (*CSW will initial, date and re-position this form in  chart as items are completed):  Yes   Patient/family provided with [place] Work Department's list of facilities offering this level of care within the geographic area requested by the patient (or if unable, by the patient's family).  Yes   Patient/family informed of their freedom to choose among providers that offer the needed level of care, that participate in Medicare, Medicaid or managed care program needed by the patient, have an available bed and are willing to accept the patient.  Yes   Patient/family informed of [place]'s ownership interest in [facility] and [facility], as well as of the fact that they are under no obligation to receive care at these facilities.  PASRR submitted to EDS on       PASRR number received on       Existing PASRR number confirmed on 05/25/17     FL2 transmitted to all facilities in geographic area requested by pt/family on       FL2 transmitted to all facilities within larger geographic area on 05/25/17     Patient informed that his/her managed care company has contracts with or will negotiate with certain facilities, including the following:        Yes   Patient/family informed of bed offers received.  Patient chooses bed at [facility] recommends and patient chooses bed at      Patient to be transferred to   on  [facility]).  Patient to be transferred to facility by [facility]     Patient family notified on 05/25/17 of transfer.  Name of family member notified:  [name] son of pt     PHYSICIAN Please prepare priority discharge summary, including medications      Additional Comment:    _______________________________________________ Claudine Mouton, LCSWA 05/25/2017, 2:01 PM

## 2017-05-25 NOTE — Care Management Note (Signed)
Case Management Note  Patient Details  Name: Casey Gates MRN: [id number] Date of Birth: Apr 18, 1926  Subjective/Objective:  83 y.o. M seen in the ED 05/23/2017 for L Knee pain and discharged with son who stated he would "pay privately for [facility] services", and make sure he is seen by PCP and cared for until he can ambulate safely as pt cc inability to return to his IL @ [employer]. Returned on 6/23 to [facility] as son is "out of town" . EDP unable to admit and pt has been held in ED for assist with placement. CM has called PT for eval to assess safety and ability. May need DME. Son is due to return Monday 05/26/2017. Spoke with Angelica Chessman, [place] who provides phone # [phone] for [facility] where CM spoke with Dawayne Cirri . [employer] @ [phone] is the pvt duty agency that provides nsg services and must be arranged by the family. There are no services available within the facility to assist such as transfer to AL, etc.                      Action/Plan:   Expected Discharge Date:                  Expected Discharge Plan:     In-House Referral:  Clinical Social Work  Discharge planning Services  CM Consult  Post Acute Care Choice:  Durable Medical Equipment, Home Health Choice offered to:  Patient  DME Arranged:    DME Agency:     HH Arranged:    [place] Agency:     Status of Service:  In process, will continue to follow  If discussed at Long Length of Stay Meetings, dates discussed:    Additional Comments:  Delrae Sawyers, RN 05/25/2017, 9:43 AM

## 2017-05-25 NOTE — Clinical Social Work Note (Signed)
Clinical Social Work Assessment  Patient Details  Name: Casey Gates MRN: [id number] Date of Birth: 1926/03/05  Date of referral:  05/25/17               Reason for consult:  Facility Placement, Family Concerns                Permission sought to share information with:  Facility Art therapist granted to share information::  Yes, Verbal Permission Granted  Name::        Agency::     Relationship::     Contact Information:     Housing/Transportation Living arrangements for the past 2 months:  [place] of Information:  Patient Patient Interpreter Needed:  None Criminal Activity/Legal Involvement Pertinent to Current Situation/Hospitalization:    Significant Relationships:  Adult Children Lives with:  Facility Resident Do you feel safe going back to the place where you live?  No Need for family participation in patient care:  Yes (Comment) (Pt asked that son [name] be involved)  Care giving concerns:  None  Facilities manager / plan:  CSW met with pt and confirmed pt's plan to be discharged to a [place] area SNF to live at discharge.  CSW provided active listening and validated pt's concerns.   CSW will complete FL-2 and send referrals out to SNF facilities via the hub per pt's request.  Pt has been living in [facility], prior to being admitted to [facility].  Employment status:  Retired Nurse, adult PT Recommendations:  [place] / Referral to community resources:     Patient/Family's Response to care:  Patient alert and oriented.  Patient and agreeable to plan.  Pt's son supportive and strongly involved in pt.'s care.  Pt pleasant and appreciated CSW intervention.    Patient/Family's Understanding of and Emotional Response to Diagnosis, Current Treatment, and Prognosis: Still assessing  Emotional Assessment Appearance:  Appears stated  age Attitude/Demeanor/Rapport:    Affect (typically observed):  Accepting, Adaptable, Calm, Pleasant Orientation:  Oriented to Self, Oriented to Place, Oriented to  Time, Oriented to Situation Alcohol / Substance use:    Psych involvement (Current and /or in the community):     Discharge Needs  Concerns to be addressed:  No discharge needs identified Readmission within the last 30 days:  No Current discharge risk:  None Barriers to Discharge:  No Barriers Identified   Claudine Mouton, LCSWA 05/25/2017, 12:14 PM

## 2017-05-25 NOTE — NC FL2 (Deleted)
[place] MEDICAID FL2 LEVEL OF CARE SCREENING TOOL     IDENTIFICATION  Patient Name: Casey Gates Birthdate: 29-Dec-1925 Sex: male Admission Date (Current Location): 05/24/2017  [facility] and [place] Number:  Herbalist and Address:  [facility], [place] [address] [place], [place] [address]      Provider Number: [id number]  Attending Physician Name and Address:  No att. providers found  Relative Name and Phone Number:       Current Level of Care: SNF Recommended Level of Care: [place] Prior Approval Number:    Date Approved/Denied:   PASRR Number: [id number]  Discharge Plan: SNF    Current Diagnoses: Patient Active Problem List   Diagnosis Date Noted  . Chest pain 07/29/2015  . OSA on CPAP 07/10/2015  . CPAP use counseling 07/10/2015  . Excessive daytime sleepiness 02/22/2015  . Sleep apnea 02/22/2015  . Snoring 02/22/2015  . Nocturia more than twice per night 02/22/2015  . Syncope 12/31/2012  . Bifascicular bundle branch block 12/31/2012    Class: Acute  . Cellulitis and abscess of leg, except foot 12/04/2012  . Chronic pain 12/04/2012  . Kyphosis 12/04/2012  . H/O unilateral nephrectomy 12/04/2012  . H/O prostate cancer 12/04/2012  . S/P TURP 12/04/2012    Orientation RESPIRATION BLADDER Height & Weight     Self, Time, Situation, Place  Normal Continent Weight: 152 lb (68.9 kg) Height:  6\' 1"  (185.4 cm)  BEHAVIORAL SYMPTOMS/MOOD NEUROLOGICAL BOWEL NUTRITION STATUS      Continent  (CPAP is listed in paperwork)  AMBULATORY STATUS COMMUNICATION OF NEEDS Skin   Limited Assist Verbally  ([place] on left knee from fluid being drained)                       Personal Care Assistance Level of Assistance  Bathing, Dressing Bathing Assistance: Limited assistance   Dressing Assistance: Limited assistance     Functional Limitations Info             SPECIAL CARE FACTORS FREQUENCY  PT (By licensed  PT), OT (By licensed OT)     PT Frequency: 5 OT Frequency: 5            Contractures Contractures Info: Not present    Additional Factors Info  Allergies, Code Status Code Status Info: Prior Allergies Info: Peanut oils, coconut oils           Current Medications (05/25/2017):  This is the current hospital active medication list Current Facility-Administered Medications  Medication Dose Route Frequency Provider Last Rate Last Dose  . cephALEXin (KEFLEX) capsule 500 mg  500 mg Oral Q8H Duffy Bruce, MD   500 mg at 05/25/17 0817  . gabapentin (NEURONTIN) capsule 300 mg  300 mg Oral BID Duffy Bruce, MD   300 mg at 05/24/17 2259  . mirtazapine (REMERON) tablet 15 mg  15 mg Oral QHS Duffy Bruce, MD   15 mg at 05/24/17 2259  . oxyCODONE-acetaminophen (PERCOCET/ROXICET) 5-325 MG per tablet 1-2 tablet  1-2 tablet Oral Q6H PRN Duffy Bruce, MD      . polyethylene glycol (MIRALAX / GLYCOLAX) packet 17 g  17 g Oral BID Duffy Bruce, MD   17 g at 05/24/17 2326  . sulfamethoxazole-trimethoprim (BACTRIM DS,SEPTRA DS) 800-160 MG per tablet 1 tablet  1 tablet Oral Q12H Duffy Bruce, MD   1 tablet at 05/24/17 2258   Current Outpatient Prescriptions  Medication Sig Dispense Refill  .  cephALEXin (KEFLEX) 500 MG capsule Take 1 capsule (500 mg total) by mouth 4 (four) times daily. 28 capsule 0  . gabapentin (NEURONTIN) 300 MG capsule Take 300 mg by mouth 2 (two) times daily.    . mirtazapine (REMERON) 15 MG tablet Take 15 mg by mouth at bedtime.    Marland Kitchen oxymorphone (OPANA) 10 MG tablet Take 20 mg by mouth 2 (two) times daily as needed for pain.   0  . polyethylene glycol (MIRALAX / GLYCOLAX) packet Take 17 g by mouth 2 (two) times daily. 14 each 0  . sulfamethoxazole-trimethoprim (BACTRIM DS,SEPTRA DS) 800-160 MG tablet Take 1 tablet by mouth 2 (two) times daily. 14 tablet 0  . triamcinolone ointment (KENALOG) 0.1 % Apply 1 application topically daily as needed for itching.        Discharge Medications: Please see discharge summary for a list of discharge medications.  Relevant Imaging Results:  Relevant Lab Results:   Additional Information [id number]  Alphonse Guild Ianna Salmela, LCSWA

## 2017-05-25 NOTE — ED Triage Notes (Signed)
PT meds counted by two RN's . Meds were then taken to [place] and count was verified by Pharmacist . Medications stored until DC in Pharmacy.

## 2017-05-25 NOTE — Progress Notes (Addendum)
CSW received call from CM stating pt presented to the [facility] ED on 6/23 after presenting to the [facility] ED on on 6/22.  After understanding that pt did not meet inpatient criteria that would allow the pt to be admitted to [facility], per notes, the pt's son is documented as telling the [facility] ED CSW that plan for the pt is to leave the [facility] ED , return home with home health, private pay around the clock care and then present to the pt's PCP at [facility] for assistance w/ possible placement.  Per notes, after leaving the [facility] ED on 6/23 the pt was then brought back to the [facility] ED on 6/23 where the pt was kept overnight for a SW consult on 6/24.  On 6/24 CSW received a call from pt's CM who is following up with pt's [place] to see if pt can receive around the clock private pay care, as well as home Health while either waiting to see the pt's PCP at [facility] or being possibly placed in the assisted living facility unit at [facility], instead of being cared for in the pt's Independent Living Unit at [facility].  CSW will continue to follow for D/C needs.    Alphonse Guild. Jamera Vanloan, Latanya Presser, LCAS Clinical Social Worker Ph: [phone]

## 2017-05-25 NOTE — Progress Notes (Addendum)
CSW received a call from CM stating pt's "outpatient trial" had failed and pt was unable to F/U on plan to see PCP for placement purposes due to a fall and as such Medical Director has authorized placement for the pt from the ED.  Medical Director verified with CSW pt can be placed from the ED and Medical Director authorize a "5 day LOG" for the pt to the accepting SNF facility.   CSW sending referrals out to [place] area SNF's with the permission of the pt who is alert and oriented X 4.  Pt asked that his son be notified when SNF's had beds available in order to assist in placement with the CSW.  CSW will continue to follow for D/C needs.  Casey Gates. Casey Gates, LCSWA, Deon Pilling, CSI Clinical Social Worker Ph: [phone]

## 2017-05-25 NOTE — ED Triage Notes (Signed)
PT's called. Telephone handed to PT.

## 2017-05-25 NOTE — NC FL2 (Signed)
[place] MEDICAID FL2 LEVEL OF CARE SCREENING TOOL     IDENTIFICATION  Patient Name: Casey Gates Birthdate: March 06, 1926 Sex: male Admission Date (Current Location): 05/24/2017  [facility] and [place] Number:  Herbalist and Address:  [facility], [place] [address] [place], [place] [address]      Provider Number: [id number]  Attending Physician Name and Address:  No att. providers found  Relative Name and Phone Number:       Current Level of Care: SNF Recommended Level of Care: [place] Prior Approval Number:    Date Approved/Denied:   PASRR Number: [id number]  Discharge Plan: SNF    Current Diagnoses: Patient Active Problem List   Diagnosis Date Noted  . Chest pain 07/29/2015  . OSA on CPAP 07/10/2015  . CPAP use counseling 07/10/2015  . Excessive daytime sleepiness 02/22/2015  . Sleep apnea 02/22/2015  . Snoring 02/22/2015  . Nocturia more than twice per night 02/22/2015  . Syncope 12/31/2012  . Bifascicular bundle branch block 12/31/2012    Class: Acute  . Cellulitis and abscess of leg, except foot 12/04/2012  . Chronic pain 12/04/2012  . Kyphosis 12/04/2012  . H/O unilateral nephrectomy 12/04/2012  . H/O prostate cancer 12/04/2012  . S/P TURP 12/04/2012    Orientation RESPIRATION BLADDER Height & Weight     Self, Time, Situation, Place  Normal Continent Weight: 152 lb (68.9 kg) Height:  6\' 1"  (185.4 cm)  BEHAVIORAL SYMPTOMS/MOOD NEUROLOGICAL BOWEL NUTRITION STATUS      Continent   AMBULATORY STATUS COMMUNICATION OF NEEDS Skin   Limited Assist Verbally  ([place]ss on left knee from fluid being drained)                       Personal Care Assistance Level of Assistance  Bathing, Dressing Bathing Assistance: Limited assistance   Dressing Assistance: Limited assistance     Functional Limitations Info             SPECIAL CARE FACTORS FREQUENCY  PT (By licensed PT), OT (By licensed OT)      PT Frequency: 5 OT Frequency: 5            Contractures Contractures Info: Not present    Additional Factors Info  Allergies, Code Status Code Status Info: Prior Allergies Info: Peanut oils, coconut oils           Current Medications (05/25/2017):  This is the current hospital active medication list Current Facility-Administered Medications  Medication Dose Route Frequency Provider Last Rate Last Dose  . cephALEXin (KEFLEX) capsule 500 mg  500 mg Oral Q8H Duffy Bruce, MD   500 mg at 05/25/17 0817  . gabapentin (NEURONTIN) capsule 300 mg  300 mg Oral BID Duffy Bruce, MD   300 mg at 05/25/17 1238  . mirtazapine (REMERON) tablet 15 mg  15 mg Oral QHS Duffy Bruce, MD   15 mg at 05/24/17 2259  . oxyCODONE-acetaminophen (PERCOCET/ROXICET) 5-325 MG per tablet 1-2 tablet  1-2 tablet Oral Q6H PRN Duffy Bruce, MD   2 tablet at 05/25/17 1238  . polyethylene glycol (MIRALAX / GLYCOLAX) packet 17 g  17 g Oral BID Duffy Bruce, MD   17 g at 05/24/17 2326  . sulfamethoxazole-trimethoprim (BACTRIM DS,SEPTRA DS) 800-160 MG per tablet 1 tablet  1 tablet Oral Q12H Duffy Bruce, MD   1 tablet at 05/25/17 1238   Current Outpatient Prescriptions  Medication Sig Dispense Refill  . cephALEXin (KEFLEX)  500 MG capsule Take 1 capsule (500 mg total) by mouth 4 (four) times daily. 28 capsule 0  . gabapentin (NEURONTIN) 300 MG capsule Take 300 mg by mouth 2 (two) times daily.    . mirtazapine (REMERON) 15 MG tablet Take 15 mg by mouth at bedtime.    Marland Kitchen oxymorphone (OPANA) 10 MG tablet Take 20 mg by mouth 2 (two) times daily as needed for pain.   0  . polyethylene glycol (MIRALAX / GLYCOLAX) packet Take 17 g by mouth 2 (two) times daily. 14 each 0  . sulfamethoxazole-trimethoprim (BACTRIM DS,SEPTRA DS) 800-160 MG tablet Take 1 tablet by mouth 2 (two) times daily. 14 tablet 0  . triamcinolone ointment (KENALOG) 0.1 % Apply 1 application topically daily as needed for itching.        Discharge Medications: Please see discharge summary for a list of discharge medications.  Relevant Imaging Results:  Relevant Lab Results:   Additional Information [id number]  Alphonse Guild Noeli Lavery, LCSWA

## 2017-05-25 NOTE — ED Triage Notes (Signed)
TC from Pt's. SON . Son reports DR Viviana Simpler at [facility] wanted PT ADM. To inpatient  Rehab.

## 2017-05-25 NOTE — Progress Notes (Signed)
CSW updated RN and pt.  Pt appreciative and thanked the CSW.  Alphonse Guild. Myracle Febres, LCSWA, Deon Pilling, CSI Clinical Social Worker Ph: [phone]

## 2017-05-25 NOTE — Progress Notes (Signed)
CSW received a call from [place] at [facility] has been accepted by: Helene Kelp Number for report is: [phone] Pt's room/bed number will be: 63 Accepting physician: SNF MD   Pt can arrive ASAP on 05/25/17  CSW will update RN and EDP.  Alphonse Guild. Casey Gates, Latanya Presser, LCAS Clinical Social Worker Ph: [phone]

## 2017-05-25 NOTE — ED Notes (Signed)
Pt provided bedside commode. Pt verbalized understanding of call bell within reach.

## 2017-05-25 NOTE — ED Triage Notes (Signed)
SW at bed side to assess . Pt for needs.

## 2017-05-27 ENCOUNTER — Ambulatory Visit: Payer: Medicare Other | Admitting: Internal Medicine

## 2017-05-27 ENCOUNTER — Non-Acute Institutional Stay (SKILLED_NURSING_FACILITY): Payer: Medicare Other | Admitting: Internal Medicine

## 2017-05-27 ENCOUNTER — Encounter: Payer: Self-pay | Admitting: Internal Medicine

## 2017-05-27 DIAGNOSIS — L03119 Cellulitis of unspecified part of limb: Secondary | ICD-10-CM | POA: Diagnosis not present

## 2017-05-27 DIAGNOSIS — G8929 Other chronic pain: Secondary | ICD-10-CM

## 2017-05-27 DIAGNOSIS — L02419 Cutaneous abscess of limb, unspecified: Secondary | ICD-10-CM | POA: Diagnosis not present

## 2017-05-27 NOTE — Assessment & Plan Note (Signed)
Ortho reassessment if pain fails to resolve

## 2017-05-27 NOTE — Patient Instructions (Signed)
See assessment and plan under each diagnosis in the problem list and acutely for this visit 

## 2017-05-27 NOTE — Assessment & Plan Note (Signed)
Continue dual antibiotic therapy with Keflex and Septra DS until 6/30 Follow-up on cultures which are pending

## 2017-05-27 NOTE — Progress Notes (Signed)
NURSING HOME LOCATION:  [facility] ROOM NUMBER:  [id number]  CODE STATUS:  DNR  PCP:  Velna Hatchet, [place] [address]   This is a comprehensive admission note to [facility] performed on this date less than 30 days from date of admission. Included are preadmission medical/surgical history;reconciled medication list; family history; social history and comprehensive review of systems.  Corrections and additions to the records were documented . Comprehensive physical exam was also performed. Additionally a clinical summary was entered for each active diagnosis pertinent to this admission in the Problem List to enhance continuity of care.  HPI: The patient was admitted to SNF for PT/OT following emergency room visit 05/24/17. He had been seen 6/22 for acute, progressive on chronic knee pain. The plain films were unremarkable and he was sent home with supportive care. He returned because of worsening pain and inability to bear weight. He also noted increased temperature and swelling around the knee. He described fever and chills with temperature up to 100F. There have been no history of recent trauma to the knee although in the last month the patient has fallen twice. He had previously fallen resulting in a wound to the left medial thigh. He been followed at the wound care clinic in [place] but has not been able to make his recent appointments. In the ED 10 mL of blood-tinged fluid was aspirated from the left knee. Superficial cellulitis was diagnosed and the patient was placed on Keflex and Septra DS until 6/30. As of 6/26 all cultures are pending. White count was 11,700 with slight left shift. C-reactive protein was 4.5 with normals less than 1. Sedimentation rate was normal at 10. Hospitalist service did not feel the patient required inpatient care and arrangements were made for the SNF admission as he has no family or support staff at home  The patient has  been essentially sedentary because of pain related to his kyphoscoliosis with lumbosacral degenerative disc disease for which he sees Dr. Nelva Bush. He states he's been on opioid pain medications for 20 years. He's had associated muscle loss related to sedentary lifestyle and lack of exercise.  Past medical and surgical history: Includes diagnoses of restless leg syndrome, chronic pain syndrome, and prostate cancer. He is blind in the left eye. Surgeries include TURP, prostatectomy, right nephrectomy, and cholecystectomy.  Social history: Drank up to two 5ths a day until 1985. Completed rehabilitation. Never in [place]. Never smoker  Family history: Reviewed  Review of systems: He states that his been seen by an orthopedist. MRI of knee revealed degenerative joint disease. No surgery recommended. His falls did not have any cardiac or neurologic prodrome. He states his knee "just collapses". He has chronic constipation related to pain medicines  Constitutional: No fever,significant weight change, fatigue  Eyes: No redness, discharge, pain, vision change ENT/mouth: No nasal congestion,  purulent discharge, earache,change in hearing ,sore throat  Cardiovascular: No chest pain, palpitations,paroxysmal nocturnal dyspnea, claudication, edema  Respiratory: No cough, sputum production,hemoptysis, DOE , significant snoring,apnea  Gastrointestinal: No heartburn,dysphagia,abdominal pain, nausea / vomiting,rectal bleeding, melena Genitourinary: No dysuria,hematuria, pyuria,  incontinence, nocturia Dermatologic: No rash, pruritus, change in appearance of skin Neurologic: No dizziness,headache,syncope, seizures, numbness , tingling Psychiatric: No significant anxiety , depression, insomnia, anorexia Endocrine: No change in hair/skin/ nails, excessive thirst, excessive h as unger, excessive urination  Hematologic/lymphatic: No significant bruising, lymphadenopathy,abnormal bleeding Allergy/immunology: No itchy/  watery eyes, significant sneezing, urticaria, angioedema  Physical exam:  Pertinent or positive findings: Appears  younger than stated age. Masklike facies suggested. He has a slightly prominent stare. OS is blind  & exhibits slight superior elevation and exotropia. He is missing 3 upper teeth anteriorly. Grade 1 systolic murmur at the right base with increased second heart sound. Wound on the left medial thigh is dressed. There is no significant erythema or effusion of the left knee. He has scattered abrasions of the lower extremities. Posterior tibial pulses are slightly decreased. He has interosseous wasting of the hands as well as marked calf wasting bilaterally   General appearance: no acute distress , increased work of breathing is present.   Lymphatic: No lymphadenopathy about the head, neck, axilla . Eyes: No conjunctival inflammation or lid edema is present. There is no scleral icterus. Ears:  External ear exam shows no significant lesions or deformities.   Nose:  External nasal examination shows no deformity or inflammation. Nasal mucosa are pink and moist without lesions ,exudates Oral exam: lips and gums are healthy appearing.There is no oropharyngeal erythema or exudate . Neck:  No thyromegaly, masses, tenderness noted.    Heart:  Normal rate and regular rhythm. S1  normal without gallop, click, rub .  Lungs:Chest clear to auscultation without wheezes, rhonchi,rales , rubs. Abdomen:Bowel sounds are normal. Abdomen is soft and nontender with no organomegaly, hernias,masses. GU: deferred  Extremities:  No cyanosis, clubbing,edema  Neurologic exam : Balance,Rhomberg,finger to nose testing could not be completed due to clinical state Skin: Warm & dry w/o tenting. No significant lesions or rash.  See clinical summary under each active problem in the Problem List with associated updated therapeutic plan

## 2017-05-27 NOTE — Progress Notes (Signed)
This encounter was created in error - please disregard.  This encounter was created in error - please disregard.

## 2017-05-28 LAB — BODY FLUID CULTURE: CULTURE: NO GROWTH

## 2017-05-29 LAB — CULTURE, BLOOD (ROUTINE X 2)
Culture: NO GROWTH
Culture: NO GROWTH
SPECIAL REQUESTS: ADEQUATE
SPECIAL REQUESTS: ADEQUATE

## 2017-05-29 LAB — ANAEROBIC CULTURE

## 2017-05-30 ENCOUNTER — Telehealth: Payer: Self-pay | Admitting: *Deleted

## 2017-05-30 NOTE — Telephone Encounter (Signed)
Post ED Visit - Positive Culture Follow-up  Culture report reviewed by antimicrobial stewardship pharmacist:  []  Elenor Quinones, Pharm.D. []  Heide Guile, Pharm.D., BCPS AQ-ID [x]  Parks Neptune, Pharm.D., BCPS []  Alycia Rossetti, Pharm.D., BCPS []  [place], [place].D., BCPS, AAHIVP []  Legrand Como, Pharm.D., BCPS, AAHIVP []  Salome Arnt, PharmD, BCPS []  Dimitri Ped, PharmD, BCPS []  [place] Hews, PharmD, BCPS  Positive urine culture Treated with Cephalexin and Sulfamethoxazole-Trimethoprim, organism sensitive to the same and no further patient follow-up is required at this time.  [facility] 05/30/2017, 10:49 AM

## 2017-06-10 ENCOUNTER — Other Ambulatory Visit: Payer: Self-pay

## 2017-06-10 MED ORDER — OXYMORPHONE HCL 10 MG PO TABS: 20.0000 mg | ORAL_TABLET | Freq: Two times a day (BID) | ORAL | 0 refills | Status: DC | PRN

## 2017-06-10 NOTE — Telephone Encounter (Signed)
Prescription request was received from:    [facility] [address] [place] [address]  Phone: [phone] Fax: [phone] 

## 2017-06-17 ENCOUNTER — Ambulatory Visit: Payer: Self-pay | Admitting: Orthopedic Surgery

## 2017-06-19 ENCOUNTER — Encounter (HOSPITAL_COMMUNITY)
Admission: RE | Admit: 2017-06-19 | Discharge: 2017-06-19 | Disposition: A | Discharge status: Home / Self Care | Payer: Medicare Other | Source: Ambulatory Visit | Attending: Orthopedic Surgery | Admitting: Orthopedic Surgery

## 2017-06-19 ENCOUNTER — Encounter (HOSPITAL_COMMUNITY): Payer: Self-pay | Admitting: *Deleted

## 2017-06-19 DIAGNOSIS — Z01818 Encounter for other preprocedural examination: Secondary | ICD-10-CM | POA: Diagnosis present

## 2017-06-19 LAB — BASIC METABOLIC PANEL
Anion gap: 7 (ref 5–15)
BUN: 12 mg/dL (ref 6–20)
CALCIUM: 9.1 mg/dL (ref 8.9–10.3)
CO2: 27 mmol/L (ref 22–32)
CREATININE: 0.96 mg/dL (ref 0.61–1.24)
Chloride: 107 mmol/L (ref 101–111)
GFR calc Af Amer: >60 mL/min (ref >60)
GFR calc non Af Amer: >60 mL/min (ref >60)
GLUCOSE: 100 mg/dL — AB (ref 65–99)
Potassium: 4.3 mmol/L (ref 3.5–5.1)
Sodium: 141 mmol/L (ref 135–145)

## 2017-06-19 LAB — CBC
HCT: 45.3 % (ref 39.0–52.0)
Hemoglobin: 14.4 g/dL (ref 13.0–17.0)
MCH: 29.1 pg (ref 26.0–34.0)
MCHC: 31.8 g/dL (ref 30.0–36.0)
MCV: 91.7 fL (ref 78.0–100.0)
PLATELETS: 190 10*3/uL (ref 150–400)
RBC: 4.94 MIL/uL (ref 4.22–5.81)
RDW: 13.1 % (ref 11.5–15.5)
WBC: 8.6 10*3/uL (ref 4.0–10.5)

## 2017-06-19 NOTE — Progress Notes (Signed)
Casey Gates was accompanied by his son.  Patients son has a clearance sheet he was given at surgeon's office. He reported that PCP's office called patient's home number and patient is at [facility], PCP is out of the office until after patient's surgery.  I called Margarita Grizzle , Dr Sid Falcon scheduler and informed her.  Margarita Grizzle said she will see if Dr Linna Darner, who saw patient at [facility] will be able to give patient clearance for surgery.  Patient's son voiced concern about patient taking Oxmorhone the morning, "It is a strong medication and with anesthesia, I am just supervised that he can take it."  Oxymohone is prn for patient, he has been on medication for years per patient and son.  I explained to the two of them that I am not saying that the patient has to take it, just that is is not prohibited. Casey Sellin , the patient said, "well I haven't had it today, I asked for it and I didn't get it before I left."  I

## 2017-06-19 NOTE — Pre-Procedure Instructions (Signed)
Casey Gates  06/19/2017    Your procedure is scheduled on Monday, July 23.  Report to [facility] Admitting at 7:45 AM                    Your surgery or procedure is scheduled for 9:45 A.M.   Call this number if you have problems the morning of surgery: [phone] (pre op desk)   Remember:  Do not eat food or drink liquids after midnight Sunday, July 22.  Take these medicines the morning of surgery with A SIP OF WATER : gabapentin (NEURONTIN).                     May take If needed: oxymorphone (OPANA).                1 Week prior to surgery STOP taking Aspirin, Aspirin Products (Goody Powder, Excedrin Migraine), Ibuprofen (Advil), Naproxen (Aleve), Vitamins and Herbal Products (ie Fish Oil).  Special instructions:   [place]- Preparing For Surgery  Before surgery, you can play an important role. Because skin is not sterile, your skin needs to be as free of germs as possible. You can reduce the number of germs on your skin by washing with CHG (chlorahexidine gluconate) Soap before surgery.  CHG is an antiseptic cleaner which kills germs and bonds with the skin to continue killing germs even after washing.  Please do not use if you have an allergy to CHG or antibacterial soaps. If your skin becomes reddened/irritated stop using the CHG.  Do not shave (including legs and underarms) for at least 48 hours prior to first CHG shower. It is OK to shave your face.  Please follow these instructions carefully.   1. Shower the NIGHT BEFORE SURGERY and the MORNING OF SURGERY with CHG.   2. If you chose to wash your hair, wash your hair first as usual with your normal shampoo.  3. After you shampoo, rinse your hair and body thoroughly to remove the shampoo.   Wash [employer] and private area with your regular soap.  4. Use CHG as you would any other liquid soap. You can apply CHG directly to the skin and wash gently with a scrungie or a clean washcloth.   5. Apply the CHG  Soap to your body ONLY FROM THE NECK DOWN.  Do not use on open wounds or open sores. Avoid contact with your eyes, ears, mouth and genitals (private parts). Wash genitals (private parts) with your normal soap.  6. Wash thoroughly, paying special attention to the area where your surgery will be performed.  7. Thoroughly rinse your body with warm water from the neck down.  8. DO NOT shower/wash with your normal soap after using and rinsing off the CHG Soap.  9. Pat yourself dry with a CLEAN TOWEL.   10. Wear CLEAN PAJAMAS   11. Place CLEAN SHEETS on your bed the night of your first shower and DO NOT SLEEP WITH PETS. 12.  Day of Surgery: Shower as above Do not apply any deodorants/lotions, powders or colognes. Please wear clean clothes to the hospital/surgery center. Do not wear jewelry, make-up or nail polish.  Do not shave 48 hours prior to surgery.  Men may shave face and neck.  Do not bring valuables to the hospital.  [facility] is not responsible for any belongings or valuables.  Contacts, dentures or bridgework may not be worn into surgery.  Leave your  suitcase in the car.  After surgery it may be brought to your room.  For patients admitted to the hospital, discharge time will be determined by your treatment team.  Patients discharged the day of surgery will not be allowed to drive home.   Name and phone number of your driver:  -   Please read over the following fact sheets that you were given: Pain Booklet, Incentive Spirometry, Surgical Site Infections.

## 2017-06-20 MED ORDER — CEFAZOLIN SODIUM-DEXTROSE 2-4 GM/100ML-% IV SOLN
2.0000 g | INTRAVENOUS | Status: DC
  Filled 2017-06-20: qty 100

## 2017-06-21 ENCOUNTER — Encounter (HOSPITAL_COMMUNITY): Payer: Self-pay | Admitting: Certified Registered"

## 2017-06-23 ENCOUNTER — Encounter (HOSPITAL_COMMUNITY)
Admission: RE | Disposition: A | Discharge status: Skilled Nursing Facility | Payer: Self-pay | Source: Ambulatory Visit | Attending: Orthopedic Surgery

## 2017-06-23 ENCOUNTER — Encounter (HOSPITAL_COMMUNITY): Payer: Self-pay | Admitting: Certified Registered"

## 2017-06-23 ENCOUNTER — Ambulatory Visit (HOSPITAL_COMMUNITY)
Admission: RE | Admit: 2017-06-23 | Discharge: 2017-06-23 | Disposition: A | Discharge status: Skilled Nursing Facility | Payer: Medicare Other | Source: Ambulatory Visit | Attending: Orthopedic Surgery | Admitting: Orthopedic Surgery

## 2017-06-23 DIAGNOSIS — R21 Rash and other nonspecific skin eruption: Secondary | ICD-10-CM | POA: Diagnosis not present

## 2017-06-23 DIAGNOSIS — Z539 Procedure and treatment not carried out, unspecified reason: Secondary | ICD-10-CM | POA: Insufficient documentation

## 2017-06-23 HISTORY — DX: Cellulitis, unspecified: L03.90

## 2017-06-23 SURGERY — REPAIR, MUSCLE, QUADRICEPS OR HAMSTRING
Anesthesia: General | Laterality: Left

## 2017-06-23 MED ORDER — SUCCINYLCHOLINE CHLORIDE 200 MG/10ML IV SOSY
PREFILLED_SYRINGE | INTRAVENOUS | Status: AC
  Filled 2017-06-23: qty 10

## 2017-06-23 MED ORDER — LIDOCAINE HCL (CARDIAC) 20 MG/ML IV SOLN
INTRAVENOUS | Status: AC
  Filled 2017-06-23: qty 5

## 2017-06-23 MED ORDER — FENTANYL CITRATE (PF) 250 MCG/5ML IJ SOLN
INTRAMUSCULAR | Status: AC
  Filled 2017-06-23: qty 5

## 2017-06-23 MED ORDER — LACTATED RINGERS IV SOLN
INTRAVENOUS | Status: DC
  Administered 2017-06-23: 09:00:00 via INTRAVENOUS

## 2017-06-23 MED ORDER — CHLORHEXIDINE GLUCONATE 4 % EX LIQD: 60.0000 mL | Freq: Once | CUTANEOUS | Status: DC

## 2017-06-23 MED ORDER — DEXAMETHASONE SODIUM PHOSPHATE 10 MG/ML IJ SOLN
INTRAMUSCULAR | Status: AC
  Filled 2017-06-23: qty 1

## 2017-06-23 MED ORDER — PROPOFOL 10 MG/ML IV BOLUS
INTRAVENOUS | Status: AC
  Filled 2017-06-23: qty 20

## 2017-06-23 MED ORDER — EPHEDRINE 5 MG/ML INJ
INTRAVENOUS | Status: AC
  Filled 2017-06-23: qty 10

## 2017-06-23 MED ORDER — ONDANSETRON HCL 4 MG/2ML IJ SOLN
INTRAMUSCULAR | Status: AC
  Filled 2017-06-23: qty 2

## 2017-06-23 MED ORDER — PHENYLEPHRINE 40 MCG/ML (10ML) SYRINGE FOR IV PUSH (FOR BLOOD PRESSURE SUPPORT)
PREFILLED_SYRINGE | INTRAVENOUS | Status: AC
  Filled 2017-06-23: qty 10

## 2017-06-23 MED ORDER — ROCURONIUM BROMIDE 10 MG/ML (PF) SYRINGE
PREFILLED_SYRINGE | INTRAVENOUS | Status: AC
  Filled 2017-06-23: qty 5

## 2017-06-23 NOTE — Anesthesia Preprocedure Evaluation (Deleted)
Anesthesia Evaluation    Airway        Dental   Pulmonary           Cardiovascular   '16 ECHO: EF 75-80%, mild AI   Neuro/Psych DDD    GI/Hepatic   Endo/Other    Renal/GU Renal InsufficiencyRenal disease (creat 0.96)     Musculoskeletal  (+) Arthritis ,   Abdominal   Peds  Hematology   Anesthesia Other Findings Prostate cancer  Reproductive/Obstetrics                             Anesthesia Physical Anesthesia Plan Anesthesia Quick Evaluation

## 2017-06-23 NOTE — Progress Notes (Signed)
Patient has rash over surgical site. Excoriated area, ? eczema. Discussed with patient and son increased risk of infection. Will reschedule after he sees dermatology and rash is resolved.

## 2017-06-23 NOTE — Progress Notes (Signed)
Dr. Lyla Glassing cancelled surgery due to rash on pt's left leg. Instructions given to pt/son to follow up with a dermatologist. They verbalize understanding of these instructions.

## 2017-06-26 ENCOUNTER — Non-Acute Institutional Stay (SKILLED_NURSING_FACILITY): Payer: Medicare Other | Admitting: Internal Medicine

## 2017-06-26 ENCOUNTER — Encounter: Payer: Self-pay | Admitting: Internal Medicine

## 2017-06-26 DIAGNOSIS — L309 Dermatitis, unspecified: Secondary | ICD-10-CM | POA: Insufficient documentation

## 2017-06-26 DIAGNOSIS — S76112D Strain of left quadriceps muscle, fascia and tendon, subsequent encounter: Secondary | ICD-10-CM | POA: Diagnosis not present

## 2017-06-26 DIAGNOSIS — S76112A Strain of left quadriceps muscle, fascia and tendon, initial encounter: Secondary | ICD-10-CM | POA: Insufficient documentation

## 2017-06-26 NOTE — Assessment & Plan Note (Signed)
Surgery will be rescheduled when the rash has resolved to decrease risk of perioperative infection

## 2017-06-26 NOTE — Progress Notes (Signed)
    NURSING HOME LOCATION:  [facility] ROOM NUMBER:  [id number]  CODE STATUS:  DNR  PCP:  Velna Hatchet, [place] [address]  This is a nursing facility follow up of chronic medical diagnoses  Interim medical record and care since last [place] visit was updated with review of diagnostic studies and change in clinical status since last visit were documented.  HPI: Doctor Swinteck diagnosed a left quad tendon tear and has recommended surgery. Surgery was scheduled for 7/23 but this was deferred because of a rash over the surgical site.Eczema was questioned & because of increased risk of perioperative infection dermatologic evaluation  was pursued.  The patient was seen by Dr. Allyson Sabal 06/24/17; triamcinolone cream was prescribed twice daily with prednisone 5 mg weaning over one week.Topical Aveeno or Cetaphil was recommended for dry skin. Abrasion left thigh was to be cleaned twice daily with application of Neosporin or Polysporin. The patient has history of asthma. He also has some rhinitis but no other extrinsic symptoms.  Review of systems: Constitutional: No fever,significant weight change  Respiratory: No cough, sputum production,wheezing Allergy/immunology: No itchy/ watery eyes, significant sneezing, urticaria, angioedema  Physical exam:  Pertinent or positive findings: Appears younger than his stated age. Prominent stare is present. Ptosis present on the left. There is intermittent exotropia of the left eye. There is slight splitting intermittently of the second heart sound. He has dramatic wasting of the lower extremities of thighs and calves. The left medial thigh exhibits irregular abrasion which is dressed. He has faint erythema in an irregular distribution over the left anterior thigh. This erythema blanches with pressure. Pedal pulses are decreased. General appearance: no acute distress , increased work of breathing is present.   Nose:  External  nasal examination shows no deformity or inflammation. Nasal mucosa are pink and moist without lesions ,exudates Oral exam: lips and gums are healthy appearing.There is no oropharyngeal erythema or exudate . Heart:  Normal rate and regular rhythm. S1  normal without gallop, murmur, click, rub .  Lungs:Chest clear to auscultation without wheezes, rhonchi,rales , rubs. Abdomen:Bowel sounds are normal. Abdomen is soft and nontender with no organomegaly, hernias,masses. GU: deferred  Extremities:  No cyanosis, clubbing,edema  Skin: Warm & dry w/o tenting.  See summary under each active diagnosis for this visit with associated updated therapeutic plan

## 2017-06-27 ENCOUNTER — Encounter: Payer: Self-pay | Admitting: Internal Medicine

## 2017-06-27 NOTE — Patient Instructions (Signed)
See assessment and plan under each diagnosis acutely for this visit  

## 2017-07-01 ENCOUNTER — Non-Acute Institutional Stay (SKILLED_NURSING_FACILITY): Payer: Medicare Other | Admitting: Internal Medicine

## 2017-07-01 ENCOUNTER — Encounter: Payer: Self-pay | Admitting: Internal Medicine

## 2017-07-01 DIAGNOSIS — L309 Dermatitis, unspecified: Secondary | ICD-10-CM | POA: Diagnosis not present

## 2017-07-01 DIAGNOSIS — S76112D Strain of left quadriceps muscle, fascia and tendon, subsequent encounter: Secondary | ICD-10-CM

## 2017-07-01 NOTE — Assessment & Plan Note (Signed)
07/01/17 I have contacted both Dr. Ledell Peoples and Dr. Sid Falcon offices to obtain contact information to allow transmissions of photos of the involved area to them. If they feel there's been adequate resolution, surgery can be rescheduled. Again I asked the patient to leave off the soft support brace of the left lower extremity when not ambulating as sweating could exacerbate the rash.

## 2017-07-01 NOTE — Patient Instructions (Signed)
See assessment and plan under each diagnosis in the problem list and acutely for this visit 

## 2017-07-01 NOTE — Assessment & Plan Note (Addendum)
07/01/17 clinically the rash appears to be essentially resolved I shall transmit photographs to Dr. Allyson Sabal and [place] for their opinion in reference to rescheduling surgery

## 2017-07-01 NOTE — Progress Notes (Signed)
    NURSING HOME LOCATION:  [facility] ROOM NUMBER:   [id number]  CODE STATUS:  Full Code  PCP:  Velna Hatchet, [place] [id number]   This is a nursing facility follow up of eczematoid rash left lower extremity.  Interim medical record and care since last Jay visit was updated with review of diagnostic studies and change in clinical status since last visit were documented.  HPI:Dr Swinteck, Orthopedist had canceled the surgery for surgical repair of rupture of the quadriceps tendon of the left lower extremity 7/23 due to eczematoid rash at the operative site. 7/24 Dr. Allyson Sabal initiated topical steroid, oral steroid wean and moisturizing treatments. Dr. Allyson Sabal has requested I assess the response of the eczematoid rash to the treatments to see the surgery can be rescheduled @ this time. Unrelated is a laceration sustained in a fall on the posterior aspect of left lower extremity which would not preclude surgery.  Review of systems: The patient denies any active symptoms except for intermittent night sweats. This occurred night before last without other constitutional symptoms. Both the CNA and the patient feel the rash has improved significantly. Constitutional: No fever,significant weight change, fatigue  Eyes: No redness, discharge, pain, vision change ENT/mouth: No nasal congestion,  purulent discharge, earache,change in hearing ,sore throat  Cardiovascular: No chest pain, palpitations,paroxysmal nocturnal dyspnea, claudication, edema  Respiratory: No cough, sputum production,hemoptysis, DOE , significant snoring,apnea   Endocrine: No change in hair/skin/ nails, excessive thirst, excessive hunger, excessive urination  Hematologic/lymphatic: No significant bruising, lymphadenopathy,abnormal bleeding Allergy/immunology: No itchy/ watery eyes, significant sneezing, urticaria, angioedema  Physical exam:  Pertinent or positive findings: The first heart  sound is accentuated. There is slurring of second heart sound. The most striking physical finding is the atrophy of the limbs particularly the lower extremities. Pedal pulses are decreased & are nonpalpable except for the left dorsalis pedis pulse. There is minimal erythema in an irregular distribution over the left inferior anterior thigh. There is slight blanching with pressure suggested. Photographs were taken to transmit to Drs. Lupton and Dr. Lyla Glassing to ascertain whether surgery can be pursued. Unfortunately I did not see the rash prior to cancellation of surgery on 7/23. The laceration of the left thigh was also assessed. There is minimal serous drainage on the dressing. The lesion appears well healed without evidence of cellulitis.  General appearance:Adequately nourished; no acute distress , increased work of breathing is present.   Lymphatic: No lymphadenopathy about the head, neck, axilla . Eyes: No conjunctival inflammation or lid edema is present. There is no scleral icterus. Neck:  No thyromegaly, masses, tenderness noted.    Heart:  Normal rate and regular rhythm. S1 and S2 normal without gallop, murmur, click, rub .  Lungs:Chest clear to auscultation without wheezes, rhonchi,rales , rubs. Abdomen:Bowel sounds are normal. Abdomen is soft and nontender with no organomegaly, hernias,masses. GU: deferred  Extremities:  No cyanosis, clubbing,edema  Skin: Warm & dry w/o tenting.  See summary under each active problem in the Problem List with associated updated therapeutic plan

## 2017-07-03 ENCOUNTER — Ambulatory Visit: Payer: Self-pay | Admitting: Orthopedic Surgery

## 2017-07-03 NOTE — Pre-Procedure Instructions (Signed)
    Casey Gates  07/03/2017    [name] procedure is scheduled on Monday, July 07, 2017 at 2:15 PM.   Report to [facility] Entrance "A" Admitting Office at 11:45 AM.   Call this number if you have problems the morning of surgery: [phone]   Questions prior to day of surgery, please call [phone] between 8 & 4 PM.   Remember:  Patient is not to eat food or drink liquids after midnight Sunday, 07/06/17.  Take these medicines the morning of surgery with A SIP OF WATER: Gabapentin (Neurontin), Oxymorphone or Tylenol   Do not wear jewelry.  Do not wear lotions, powders, colgone or deodorant.  Do not shave 48 hours prior to surgery.  Men may shave face and neck.  Do not bring valuables to the hospital.  [facility] is not responsible for any belongings or valuables.  Contacts, dentures or bridgework may not be worn into surgery.    For patients admitted to the hospital, discharge time will be determined by your treatment team.

## 2017-07-03 NOTE — Progress Notes (Signed)
Pt is a resident at [facility]. Spoke with [place], [place]. Pt had previous PAT on 06/19/17 and surgery was rescheduled due to rash on pt's leg. Clearance has been received from Dr. Allyson Sabal, Dermatologist that pt is cleared to have surgery. [place] verifies that there have been no changes to pt's allergies, medications or medical history. I faxed pre-op instructions to [place] at [phone]. I also called pt's son, Ovadia Lopp and gave him time of arrival for pt. He expressed his appreciation for the call.

## 2017-07-07 ENCOUNTER — Inpatient Hospital Stay (HOSPITAL_COMMUNITY): Payer: Medicare Other | Admitting: Certified Registered Nurse Anesthetist

## 2017-07-07 ENCOUNTER — Encounter (HOSPITAL_COMMUNITY): Payer: Self-pay | Admitting: *Deleted

## 2017-07-07 ENCOUNTER — Observation Stay (HOSPITAL_COMMUNITY)
Admission: RE | Admit: 2017-07-07 | Discharge: 2017-07-08 | Disposition: A | Discharge status: Skilled Nursing Facility | Payer: Medicare Other | Source: Ambulatory Visit | Attending: Orthopedic Surgery | Admitting: Orthopedic Surgery

## 2017-07-07 ENCOUNTER — Encounter (HOSPITAL_COMMUNITY)
Admission: RE | Disposition: A | Discharge status: Skilled Nursing Facility | Payer: Self-pay | Source: Ambulatory Visit | Attending: Orthopedic Surgery

## 2017-07-07 DIAGNOSIS — G2581 Restless legs syndrome: Secondary | ICD-10-CM | POA: Insufficient documentation

## 2017-07-07 DIAGNOSIS — M5136 Other intervertebral disc degeneration, lumbar region: Secondary | ICD-10-CM | POA: Diagnosis not present

## 2017-07-07 DIAGNOSIS — Z8546 Personal history of malignant neoplasm of prostate: Secondary | ICD-10-CM | POA: Diagnosis not present

## 2017-07-07 DIAGNOSIS — S76112A Strain of left quadriceps muscle, fascia and tendon, initial encounter: Secondary | ICD-10-CM | POA: Diagnosis not present

## 2017-07-07 DIAGNOSIS — G894 Chronic pain syndrome: Secondary | ICD-10-CM | POA: Diagnosis not present

## 2017-07-07 DIAGNOSIS — N189 Chronic kidney disease, unspecified: Secondary | ICD-10-CM | POA: Diagnosis not present

## 2017-07-07 DIAGNOSIS — Z7982 Long term (current) use of aspirin: Secondary | ICD-10-CM | POA: Diagnosis not present

## 2017-07-07 DIAGNOSIS — G4700 Insomnia, unspecified: Secondary | ICD-10-CM | POA: Insufficient documentation

## 2017-07-07 DIAGNOSIS — Z79899 Other long term (current) drug therapy: Secondary | ICD-10-CM | POA: Diagnosis not present

## 2017-07-07 HISTORY — PX: QUADRICEPS TENDON REPAIR: SHX756

## 2017-07-07 LAB — CBC
HCT: 45.9 % (ref 39.0–52.0)
Hemoglobin: 14.9 g/dL (ref 13.0–17.0)
MCH: 29.6 pg (ref 26.0–34.0)
MCHC: 32.5 g/dL (ref 30.0–36.0)
MCV: 91.3 fL (ref 78.0–100.0)
PLATELETS: 176 10*3/uL (ref 150–400)
RBC: 5.03 MIL/uL (ref 4.22–5.81)
RDW: 13.7 % (ref 11.5–15.5)
WBC: 9.5 10*3/uL (ref 4.0–10.5)

## 2017-07-07 LAB — BASIC METABOLIC PANEL
ANION GAP: 7 (ref 5–15)
BUN: 15 mg/dL (ref 6–20)
CO2: 24 mmol/L (ref 22–32)
Calcium: 8.9 mg/dL (ref 8.9–10.3)
Chloride: 111 mmol/L (ref 101–111)
Creatinine, Ser: 0.92 mg/dL (ref 0.61–1.24)
Glucose, Bld: 88 mg/dL (ref 65–99)
POTASSIUM: 4.1 mmol/L (ref 3.5–5.1)
SODIUM: 142 mmol/L (ref 135–145)

## 2017-07-07 SURGERY — REPAIR, TENDON, QUADRICEPS
Anesthesia: General | Laterality: Left

## 2017-07-07 MED ORDER — MIDAZOLAM HCL 2 MG/2ML IJ SOLN
1.0000 mg | Freq: Once | INTRAMUSCULAR | Status: AC
  Administered 2017-07-07: 1 mg via INTRAVENOUS

## 2017-07-07 MED ORDER — MAGNESIUM HYDROXIDE 400 MG/5ML PO SUSP: 30.0000 mL | Freq: Every day | ORAL | Status: DC | PRN

## 2017-07-07 MED ORDER — DEXAMETHASONE SODIUM PHOSPHATE 10 MG/ML IJ SOLN
INTRAMUSCULAR | Status: AC
  Filled 2017-07-07: qty 1

## 2017-07-07 MED ORDER — SENNA 8.6 MG PO TABS
1.0000 | ORAL_TABLET | Freq: Two times a day (BID) | ORAL | Status: DC
  Administered 2017-07-08: 8.6 mg via ORAL
  Filled 2017-07-07 (×2): qty 1

## 2017-07-07 MED ORDER — ACETAMINOPHEN 650 MG RE SUPP: 650.0000 mg | Freq: Four times a day (QID) | RECTAL | Status: DC | PRN

## 2017-07-07 MED ORDER — ONDANSETRON HCL 4 MG/2ML IJ SOLN
INTRAMUSCULAR | Status: AC
  Filled 2017-07-07: qty 2

## 2017-07-07 MED ORDER — BOOST / RESOURCE BREEZE PO LIQD: 1.0000 | Freq: Three times a day (TID) | ORAL | Status: DC

## 2017-07-07 MED ORDER — LIDOCAINE 2% (20 MG/ML) 5 ML SYRINGE
INTRAMUSCULAR | Status: AC
  Filled 2017-07-07: qty 5

## 2017-07-07 MED ORDER — HYDROMORPHONE HCL 1 MG/ML IJ SOLN: 0.5000 mg | INTRAMUSCULAR | Status: DC | PRN

## 2017-07-07 MED ORDER — ONDANSETRON HCL 4 MG/2ML IJ SOLN: 4.0000 mg | Freq: Once | INTRAMUSCULAR | Status: DC | PRN

## 2017-07-07 MED ORDER — MORPHINE SULFATE 15 MG PO TABS: 45.0000 mg | ORAL_TABLET | ORAL | Status: DC | PRN

## 2017-07-07 MED ORDER — ONDANSETRON HCL 4 MG/2ML IJ SOLN
INTRAMUSCULAR | Status: DC | PRN
  Administered 2017-07-07: 4 mg via INTRAVENOUS

## 2017-07-07 MED ORDER — LACTATED RINGERS IV SOLN
INTRAVENOUS | Status: DC
  Administered 2017-07-07 (×2): via INTRAVENOUS

## 2017-07-07 MED ORDER — MEPERIDINE HCL 25 MG/ML IJ SOLN: 6.2500 mg | INTRAMUSCULAR | Status: DC | PRN

## 2017-07-07 MED ORDER — ONDANSETRON HCL 4 MG PO TABS: 4.0000 mg | ORAL_TABLET | Freq: Four times a day (QID) | ORAL | Status: DC | PRN

## 2017-07-07 MED ORDER — EPHEDRINE 5 MG/ML INJ
INTRAVENOUS | Status: AC
  Filled 2017-07-07: qty 10

## 2017-07-07 MED ORDER — DOCUSATE SODIUM 100 MG PO CAPS
100.0000 mg | ORAL_CAPSULE | Freq: Two times a day (BID) | ORAL | Status: DC
  Administered 2017-07-08: 100 mg via ORAL
  Filled 2017-07-07 (×2): qty 1

## 2017-07-07 MED ORDER — ENOXAPARIN SODIUM 30 MG/0.3ML ~~LOC~~ SOLN
30.0000 mg | SUBCUTANEOUS | Status: DC
  Administered 2017-07-08: 30 mg via SUBCUTANEOUS
  Filled 2017-07-07: qty 0.3

## 2017-07-07 MED ORDER — ONDANSETRON HCL 4 MG/2ML IJ SOLN: 4.0000 mg | Freq: Four times a day (QID) | INTRAMUSCULAR | Status: DC | PRN

## 2017-07-07 MED ORDER — FENTANYL CITRATE (PF) 250 MCG/5ML IJ SOLN
INTRAMUSCULAR | Status: AC
  Filled 2017-07-07: qty 5

## 2017-07-07 MED ORDER — BISACODYL 10 MG RE SUPP: 10.0000 mg | Freq: Every day | RECTAL | Status: DC | PRN

## 2017-07-07 MED ORDER — POLYETHYLENE GLYCOL 3350 17 G PO PACK
17.0000 g | PACK | Freq: Two times a day (BID) | ORAL | Status: DC
  Administered 2017-07-08: 17 g via ORAL
  Filled 2017-07-07: qty 1

## 2017-07-07 MED ORDER — DEXAMETHASONE SODIUM PHOSPHATE 10 MG/ML IJ SOLN
INTRAMUSCULAR | Status: DC | PRN
  Administered 2017-07-07: 5 mg via INTRAVENOUS

## 2017-07-07 MED ORDER — MIDAZOLAM HCL 2 MG/2ML IJ SOLN
INTRAMUSCULAR | Status: AC
  Administered 2017-07-07: 1 mg via INTRAVENOUS
  Filled 2017-07-07: qty 2

## 2017-07-07 MED ORDER — METOCLOPRAMIDE HCL 5 MG/ML IJ SOLN: 5.0000 mg | Freq: Three times a day (TID) | INTRAMUSCULAR | Status: DC | PRN

## 2017-07-07 MED ORDER — CEFAZOLIN SODIUM-DEXTROSE 2-4 GM/100ML-% IV SOLN
2.0000 g | INTRAVENOUS | Status: AC
  Administered 2017-07-07: 2 g via INTRAVENOUS

## 2017-07-07 MED ORDER — ROPIVACAINE HCL 5 MG/ML IJ SOLN
INTRAMUSCULAR | Status: DC | PRN
  Administered 2017-07-07: 25 mL via PERINEURAL

## 2017-07-07 MED ORDER — PHENYLEPHRINE HCL 10 MG/ML IJ SOLN
INTRAVENOUS | Status: DC | PRN
  Administered 2017-07-07: 40 ug/min via INTRAVENOUS

## 2017-07-07 MED ORDER — EPHEDRINE SULFATE 50 MG/ML IJ SOLN
INTRAMUSCULAR | Status: DC | PRN
  Administered 2017-07-07 (×2): 10 mg via INTRAVENOUS

## 2017-07-07 MED ORDER — 0.9 % SODIUM CHLORIDE (POUR BTL) OPTIME
TOPICAL | Status: DC | PRN
  Administered 2017-07-07: 1000 mL

## 2017-07-07 MED ORDER — ACETAMINOPHEN 325 MG PO TABS: 650.0000 mg | ORAL_TABLET | Freq: Four times a day (QID) | ORAL | Status: DC | PRN

## 2017-07-07 MED ORDER — LIDOCAINE 2% (20 MG/ML) 5 ML SYRINGE
INTRAMUSCULAR | Status: DC | PRN
  Administered 2017-07-07: 100 mg via INTRAVENOUS

## 2017-07-07 MED ORDER — GABAPENTIN 300 MG PO CAPS
300.0000 mg | ORAL_CAPSULE | Freq: Two times a day (BID) | ORAL | Status: DC
  Administered 2017-07-07 – 2017-07-08 (×2): 300 mg via ORAL
  Filled 2017-07-07 (×2): qty 1

## 2017-07-07 MED ORDER — FENTANYL CITRATE (PF) 100 MCG/2ML IJ SOLN
INTRAMUSCULAR | Status: AC
  Administered 2017-07-07: 50 ug via INTRAVENOUS
  Filled 2017-07-07: qty 2

## 2017-07-07 MED ORDER — FENTANYL CITRATE (PF) 100 MCG/2ML IJ SOLN
50.0000 ug | Freq: Once | INTRAMUSCULAR | Status: AC
  Administered 2017-07-07: 50 ug via INTRAVENOUS

## 2017-07-07 MED ORDER — CHLORHEXIDINE GLUCONATE 4 % EX LIQD: 60.0000 mL | Freq: Once | CUTANEOUS | Status: DC

## 2017-07-07 MED ORDER — METOCLOPRAMIDE HCL 5 MG PO TABS: 5.0000 mg | ORAL_TABLET | Freq: Three times a day (TID) | ORAL | Status: DC | PRN

## 2017-07-07 MED ORDER — PROPOFOL 10 MG/ML IV BOLUS
INTRAVENOUS | Status: DC | PRN
Start: 2017-07-07 — End: 2017-07-07
  Administered 2017-07-07: 140 mg via INTRAVENOUS

## 2017-07-07 MED ORDER — PROPOFOL 10 MG/ML IV BOLUS
INTRAVENOUS | Status: AC
  Filled 2017-07-07: qty 20

## 2017-07-07 MED ORDER — MIRTAZAPINE 15 MG PO TABS
15.0000 mg | ORAL_TABLET | Freq: Every day | ORAL | Status: DC
  Administered 2017-07-07: 15 mg via ORAL
  Filled 2017-07-07: qty 1

## 2017-07-07 MED ORDER — FENTANYL CITRATE (PF) 100 MCG/2ML IJ SOLN: 25.0000 ug | INTRAMUSCULAR | Status: DC | PRN

## 2017-07-07 SURGICAL SUPPLY — 55 items
ALCOHOL 70% 16 OZ (MISCELLANEOUS) ×3 IMPLANT
BANDAGE ACE 4X5 VEL STRL LF (GAUZE/BANDAGES/DRESSINGS) ×3 IMPLANT
BANDAGE ACE 6X5 VEL STRL LF (GAUZE/BANDAGES/DRESSINGS) ×3 IMPLANT
CANISTER WOUND CARE 500ML ATS (WOUND CARE) ×3 IMPLANT
CHLORAPREP W/TINT 26ML (MISCELLANEOUS) ×3 IMPLANT
CUFF TOURNIQUET SINGLE 34IN LL (TOURNIQUET CUFF) ×3 IMPLANT
CUFF TOURNIQUET SINGLE 44IN (TOURNIQUET CUFF) IMPLANT
DERMABOND ADVANCED (GAUZE/BANDAGES/DRESSINGS) ×2
DERMABOND ADVANCED .7 DNX12 (GAUZE/BANDAGES/DRESSINGS) ×1 IMPLANT
DRAPE U-SHAPE 47X51 STRL (DRAPES) ×3 IMPLANT
DRSG AQUACEL AG ADV 3.5X10 (GAUZE/BANDAGES/DRESSINGS) ×3 IMPLANT
DRSG PAD ABDOMINAL 8X10 ST (GAUZE/BANDAGES/DRESSINGS) ×3 IMPLANT
ELECT REM PT RETURN 9FT ADLT (ELECTROSURGICAL) ×3
ELECTRODE REM PT RTRN 9FT ADLT (ELECTROSURGICAL) ×1 IMPLANT
GLOVE BIO SURGEON STRL SZ8.5 (GLOVE) ×3 IMPLANT
GLOVE BIOGEL PI IND STRL 6.5 (GLOVE) ×1 IMPLANT
GLOVE BIOGEL PI IND STRL 8 (GLOVE) ×1 IMPLANT
GLOVE BIOGEL PI IND STRL 8.5 (GLOVE) ×1 IMPLANT
GLOVE BIOGEL PI INDICATOR 6.5 (GLOVE) ×2
GLOVE BIOGEL PI INDICATOR 8 (GLOVE) ×2
GLOVE BIOGEL PI INDICATOR 8.5 (GLOVE) ×2
GLOVE SURG ORTHO 8.5 STRL (GLOVE) ×6 IMPLANT
GLOVE SURG SS PI 7.0 STRL IVOR (GLOVE) ×3 IMPLANT
GLOVE SURG SS PI 8.0 STRL IVOR (GLOVE) ×6 IMPLANT
GOWN STRL REUS W/ TWL LRG LVL3 (GOWN DISPOSABLE) ×1 IMPLANT
GOWN STRL REUS W/TWL 2XL LVL3 (GOWN DISPOSABLE) ×6 IMPLANT
GOWN STRL REUS W/TWL LRG LVL3 (GOWN DISPOSABLE) ×2
IMMOBILIZER KNEE 22 UNIV (SOFTGOODS) ×3 IMPLANT
KIT BASIN OR (CUSTOM PROCEDURE TRAY) ×3 IMPLANT
KIT PREVENA INCISION MGT20CM45 (CANNISTER) ×3 IMPLANT
KIT ROOM TURNOVER OR (KITS) ×3 IMPLANT
NS IRRIG 1000ML POUR BTL (IV SOLUTION) ×3 IMPLANT
PACK ORTHO EXTREMITY (CUSTOM PROCEDURE TRAY) ×3 IMPLANT
PAD ARMBOARD 7.5X6 YLW CONV (MISCELLANEOUS) ×6 IMPLANT
RETRIEVER SUT HEWSON (MISCELLANEOUS) ×3 IMPLANT
SPONGE LAP 18X18 X RAY DECT (DISPOSABLE) ×3 IMPLANT
SPONGE LAP 4X18 X RAY DECT (DISPOSABLE) ×3 IMPLANT
STAPLER VISISTAT 35W (STAPLE) ×3 IMPLANT
STOCKINETTE IMPERVIOUS 9X36 MD (GAUZE/BANDAGES/DRESSINGS) ×3 IMPLANT
SUT ETHILON 2 0 PSLX (SUTURE) ×6 IMPLANT
SUT FIBERWIRE #2 38 T-5 BLUE (SUTURE) ×6
SUT MON AB 2-0 CT1 36 (SUTURE) ×6 IMPLANT
SUT VIC AB 0 CT1 27 (SUTURE) ×4
SUT VIC AB 0 CT1 27XBRD ANBCTR (SUTURE) ×2 IMPLANT
SUT VIC AB 1 CT1 27 (SUTURE) ×2
SUT VIC AB 1 CT1 27XBRD ANBCTR (SUTURE) ×1 IMPLANT
SUT VIC AB 2-0 CT1 27 (SUTURE) ×2
SUT VIC AB 2-0 CT1 27XBRD (SUTURE) ×1 IMPLANT
SUTURE FIBERWR #2 38 T-5 BLUE (SUTURE) ×2 IMPLANT
TOWEL OR 17X24 6PK STRL BLUE (TOWEL DISPOSABLE) ×3 IMPLANT
TOWEL OR 17X26 10 PK STRL BLUE (TOWEL DISPOSABLE) ×3 IMPLANT
TUBE CONNECTING 12'X1/4 (SUCTIONS) ×1
TUBE CONNECTING 12X1/4 (SUCTIONS) ×2 IMPLANT
WATER STERILE IRR 1000ML POUR (IV SOLUTION) ×3 IMPLANT
YANKAUER SUCT BULB TIP NO VENT (SUCTIONS) ×3 IMPLANT

## 2017-07-07 NOTE — Anesthesia Procedure Notes (Signed)
Procedure Name: LMA Insertion Date/Time: 07/07/2017 3:25 PM Performed by: Candis Shine Pre-anesthesia Checklist: Patient identified, Emergency Drugs available, Suction available and Patient being monitored Patient Re-evaluated:Patient Re-evaluated prior to induction Oxygen Delivery Method: Circle System Utilized Preoxygenation: Pre-oxygenation with 100% oxygen Induction Type: IV induction Ventilation: Mask ventilation without difficulty LMA: LMA inserted LMA Size: 4.0 Number of attempts: 1 Placement Confirmation: positive ETCO2 Tube secured with: Tape Dental Injury: Teeth and Oropharynx as per pre-operative assessment

## 2017-07-07 NOTE — Anesthesia Preprocedure Evaluation (Signed)
Anesthesia Evaluation  Patient identified by MRN, date of birth, ID band Patient awake    Reviewed: Allergy & Precautions, H&P , Patient's Chart, lab work & pertinent test results, reviewed documented beta blocker date and time   Airway Mallampati: II  TM Distance: >3 FB Neck ROM: full    Dental no notable dental hx.    Pulmonary    Pulmonary exam normal breath sounds clear to auscultation       Cardiovascular  Rhythm:regular Rate:Normal     Neuro/Psych    GI/Hepatic   Endo/Other    Renal/GU      Musculoskeletal   Abdominal   Peds  Hematology   Anesthesia Other Findings - Left ventricle: The cavity size was normal. There was mild   concentric hypertrophy. Systolic function was vigorous. The   estimated ejection fraction was in the range of 75% to 80%. Wall   motion was normal; there were no regional wall motion   abnormalities. Doppler parameters are consistent with abnormal   left ventricular relaxation (grade 1 diastolic dysfunction).  Reproductive/Obstetrics                             Anesthesia Physical Anesthesia Plan  ASA: II  Anesthesia Plan: General   Post-op Pain Management:    Induction: Intravenous  PONV Risk Score and Plan: 2 and Ondansetron, Dexamethasone and Treatment may vary due to age or medical condition  Airway Management Planned: LMA  Additional Equipment:   Intra-op Plan:   Post-operative Plan:   Informed Consent: I have reviewed the patients History and Physical, chart, labs and discussed the procedure including the risks, benefits and alternatives for the proposed anesthesia with the patient or authorized representative who has indicated his/her understanding and acceptance.   Dental Advisory Given  Plan Discussed with: CRNA and Surgeon  Anesthesia Plan Comments: ( )        Anesthesia Quick Evaluation

## 2017-07-07 NOTE — Anesthesia Procedure Notes (Signed)
Anesthesia Regional Block: Femoral nerve block   Pre-Anesthetic Checklist: ,, timeout performed, Correct Patient, Correct Site, Correct Laterality, Correct Procedure, Correct Position, site marked, Risks and benefits discussed,  Surgical consent,  Pre-op evaluation,  At surgeon's request and post-op pain management  Laterality: Lower and Left  Prep: chloraprep       Needles:  Injection technique: Single-shot  Needle Type: Echogenic Stimulator Needle          Additional Needles:   Procedures: ultrasound guided,,,,,,,,  Narrative:  Start time: 07/07/2017 2:56 PM End time: 07/07/2017 3:03 PM Injection made incrementally with aspirations every 5 mL.  Performed by: Personally  Anesthesiologist: Kabao Leite  Additional Notes: H+P and labs reviewed, risks and benefits discussed with patient, procedure tolerated well without complications

## 2017-07-07 NOTE — Op Note (Signed)
Casey Gates, Casey Gates                ACCOUNT NO.:  [id number]  MEDICAL RECORD NO.:  [id number]  LOCATION:  [facility]                         FACILITY:  [facility]  PHYSICIAN:  Rod Can, MD     DATE OF BIRTH:  February 09, 1926  DATE OF PROCEDURE:  07/07/2017 DATE OF DISCHARGE:                              OPERATIVE REPORT   SURGEON:  Rod Can, MD.  ASSISTANT:  Ky Barban, RNFA.  PREOPERATIVE DIAGNOSIS:  Left quadriceps tendon rupture.  POSTOPERATIVE DIAGNOSIS:  Left quadriceps tendon rupture.  PROCEDURE PERFORMED:  Repair of left quadriceps tendon.  ESTIMATED BLOOD LOSS:  100 mL.  COMPLICATIONS:  None.  TUBES AND DRAINS:  Prevena wound VAC x1.  COMPLICATIONS:  None.  DISPOSITION:  Stable to PACU.  ANTIBIOTICS:  2 g of Ancef.  INDICATIONS:  The patient is an 83 year old male who resides at a nursing facility.  About 6 weeks ago, he began having a lot of falls, his left knee was buckling, MRI was obtained showing a quadriceps tendon rupture, he was indicated for repair of the quadriceps tendon.  Risks, benefits, and alternatives were explained to the patient and his son. They elected to proceed.  DESCRIPTION OF PROCEDURE IN DETAIL:  The patient was identified in the holding area using 2 identifiers.  Femoral nerve block was placed by the Anesthesia team.  The patient was taken to the operating room and placed supine on the operating table.  General anesthesia was induced. Nonsterile tourniquet was applied to the left thigh, but was not utilized during the procedure.  The left lower extremity was prepped and draped in a normal sterile surgical fashion.  Time-out was called verifying site and site of surgery.  I began by making a straight longitudinal incision, centered over the proximal pole of the patella. Full-thickness skin flaps were created.  The patient did have a thin layer of retinacular tissue present over the extensor mechanism.  I incised this and then I was able  to identify his quadriceps tendon tear. He had a full thickness tear off the superior pole of the patella.  He had about 2 cm of retraction proximally.  I used a Cobb elevator to free the quadriceps muscle off the anterior surface of the femur.  I debrided the superior pole of patella to bleeding bone with a rongeur.  I debrided the distal end of the quadriceps tendon sharply with a #10 blade.  I then used a 2-mm drill bit and I drilled 3 transosseous tunnels in a retrograde fashion.  I then used #2 FiberWire, 2 sutures utilizing a running Krackow stitch in the tendon, thus giving 4 limbs. The 4 limbs were passed through the 3 tunnels.  The knee was brought into full extension.  The sutures were held tight to reduce creep from the system.  The sutures were then tied and clipped.  I then repaired the retinaculum with #1 Vicryl suture, the redundant retinacular tissue anteriorly.  I repaired in a pants-over-vest fashion.  The knee was then irrigated again.  I closed the deep dermal layer with 2-0 interrupted Monocryl sutures.  The skin was closed with 2-0 nylon vertical mattress sutures.  Prevena wound VAC was  applied.  Knee immobilizer was placed after Ace wrap was applied.  The patient was then extubated and taken to PACU in stable condition.  Sponge, needle, and instrument counts were correct at the end of the case x2.  There were no known complications.  Postoperatively, we will admit the patient for overnight observation. He is to maintain the knee immobilizer at all times.  He is not to bend his knee.  We will plan for 1 week with the Prevena VAC.  We will place him on Lovenox for DVT prophylaxis.  He will work with physical therapy in the morning and then will likely be able to send him back to his nursing facility.  Plan was communicated with the family.          ______________________________ Rod Can, MD     BS/MEDQ  D:  07/07/2017  T:  07/07/2017  Job:  [id number]

## 2017-07-07 NOTE — H&P (Signed)
PREOPERATIVE H&P  Chief Complaint: Left quadricep tendon rupture  HPI: Casey Gates is a 83 y.o. male who presents for preoperative history and physical with a diagnosis of Left quadricep tendon rupture. Symptoms are rated as severe.  This is significantly impairing activities of daily living.  He has elected for surgical management.   Past Medical History:  Diagnosis Date  . Asthma    AS A CHILD  . Blind left eye   . Cancer [facility])    PROSTATE CANCER  . Cellulitis    frequently, "all over"  . Chronic kidney disease   . Chronic pain syndrome   . Constipation   . DDD (degenerative disc disease), lumbar   . Insomnia   . Kyphoscoliosis   . Restless leg    Past Surgical History:  Procedure Laterality Date  . CHOLECYSTECTOMY    . COLONOSCOPY    . PROSTATECTOMY    . RIGHT CATARACT EXTRACTION  2013  . RIGTH NEPHRECTOMY  2006  . TONSILLECTOMY    . TRANSURETHRAL RESECTION OF PROSTATE  1997   Social History   Social History  . Marital status: Widowed    Spouse name: N/A  . Number of children: N/A  . Years of education: N/A   Social History Main Topics  . Smoking status: Never Smoker  . Smokeless tobacco: Never Used  . Alcohol use No     Comment: quit 1985, 2 fifths/ week,S/P rehab  . Drug use: No  . Sexual activity: No   Other Topics Concern  . None   Social History Narrative   Caffeine 2-3 cups average.  Widowed,  Lives at [facility] [place]. [facility].  Retired Forensic psychologist.  One son.   Family History  Problem Relation Age of Onset  . Bone cancer Mother   . Hypertension Mother   . Arthritis Mother    Allergies  Allergen Reactions  . Peanuts [Peanut Oil] Shortness Of Breath    Shortness of breath---all nuts, but can have peanuts, cashews, and almonds  . Coconut Flavor [Flavoring Agent]     Anything that is related to coconut  . Coconut Oil Other (See Comments)    Unknown childhood reaction.     Prior to Admission medications   Medication Sig Start Date End  Date Taking? Authorizing Provider  acetaminophen (TYLENOL) 500 MG tablet Take 500 mg by mouth every 6 (six) hours as needed (for pain.).   Yes [provider]  gabapentin (NEURONTIN) 300 MG capsule Take 300 mg by mouth 2 (two) times daily. (0900 & 2100)   Yes [provider]  magnesium hydroxide (MILK OF MAGNESIA) 400 MG/5ML suspension Take 30 mLs by mouth daily as needed (for constipation).   Yes [provider]  mirtazapine (REMERON) 15 MG tablet Take 15 mg by mouth at bedtime. (2100)   Yes [provider]  neomycin-bacitracin-polymyxin (NEOSPORIN) [phone] ointment Apply 1 application topically 2 (two) times daily. Cleanse thighs twice daily and then apply ointment BID   Yes [provider]  oxymorphone (OPANA) 10 MG tablet Take 2 tablets (20 mg total) by mouth 2 (two) times daily as needed for pain. 06/10/17  Yes Lauree Chandler, NP  triamcinolone ointment (KENALOG) 0.1 % Apply 1 application topically. Apply BID to left upper leg for rash, apply once daily prn for itching to affected area   Yes [provider]  bisacodyl (DULCOLAX) 10 MG suppository Place 10 mg rectally daily as needed (for constipation).    [provider]  Nutritional Supplements (NUTRITIONAL SHAKE COMPLETE PO) Take 1 Dose by mouth 2 (two) times daily. Health Shake two times daily    [provider]  polyethylene glycol (MIRALAX / GLYCOLAX) packet Take 17 g by mouth 2 (two) times daily. 08/28/16   [name], MD  Sodium Phosphates (RA SALINE ENEMA RE) Place 1 Bottle rectally daily as needed (for constipation).    [provider]     Positive ROS: All other systems have been reviewed and were otherwise negative with the exception of those mentioned in the HPI and as above.  Physical Exam: General: Alert, no acute distress Cardiovascular: No pedal edema Respiratory: No cyanosis, no use of accessory musculature GI: No organomegaly,  abdomen is soft and non-tender Skin: No lesions in the area of chief complaint Neurologic: Sensation intact distally Psychiatric: Patient is competent for consent with normal mood and affect Lymphatic: No axillary or cervical lymphadenopathy  MUSCULOSKELETAL: Examination of the left knee reveals no skin wounds or lesions. He does have a palpable defect at the superior pole the patella. He is unable to perform a straight leg raise. He is neurovascularly intact.  Assessment: Left quadricep tendon rupture  Plan: Plan for Procedure(s): REPAIR LEFT QUADRICEP TENDON  The risks benefits and alternatives were discussed with the patient including but not limited to the risks of nonoperative treatment, versus surgical intervention including infection, bleeding, nerve injury,  blood clots, cardiopulmonary complications, morbidity, mortality, among others, and they were willing to proceed.   Vinessa Macconnell, Horald Pollen, MD Cell [phone]   07/07/2017 3:12 PM

## 2017-07-07 NOTE — Transfer of Care (Signed)
Immediate Anesthesia Transfer of Care Note  Patient: Casey Gates  Procedure(s) Performed: Procedure(s): REPAIR QUADRICEP TENDON (Left)  Patient Location: PACU  Anesthesia Type:GA combined with regional for post-op pain  Level of Consciousness: awake, alert  and oriented  Airway & Oxygen Therapy: Patient Spontanous Breathing and Patient connected to nasal cannula oxygen  Post-op Assessment: Report given to RN and Post -op Vital signs reviewed and stable  Post vital signs: Reviewed and stable  Last Vitals:  Vitals:   07/07/17 1509 07/07/17 1749  BP: 134/62 (!) 109/52  Pulse: 66 74  Resp: 18   Temp:      Last Pain:  Vitals:   07/07/17 1157  TempSrc: Oral      Patients Stated Pain Goal: 2 (96/88/64 8472)  Complications: No apparent anesthesia complications

## 2017-07-07 NOTE — Brief Op Note (Signed)
07/07/2017  5:07 PM  PATIENT:  Casey Gates  83 y.o. male  PRE-OPERATIVE DIAGNOSIS:  Left quadricep tendon rupture  POST-OPERATIVE DIAGNOSIS:  Left quadricep tendon rupture  PROCEDURE:  Procedure(s): REPAIR QUADRICEP TENDON (Left)  SURGEON:  Surgeon(s) and Role:    * Ryker Sudbury, Aaron Edelman, MD - Primary  PHYSICIAN ASSISTANT:   ASSISTANTS: justin queen, rnfa   ANESTHESIA:   regional and general  EBL:  Total I/O In: 1000 [I.V.:1000] Out: -   BLOOD ADMINISTERED:none  DRAINS: prevena VAC  LOCAL MEDICATIONS USED:  NONE  SPECIMEN:  No Specimen  DISPOSITION OF SPECIMEN:  N/A  COUNTS:  YES  TOURNIQUET:    DICTATION: .Other Dictation: Dictation Number [id number]  PLAN OF CARE: Admit for overnight observation  PATIENT DISPOSITION:  PACU - hemodynamically stable.   Delay start of Pharmacological VTE agent (>24hrs) due to surgical blood loss or risk of bleeding: no

## 2017-07-07 NOTE — Discharge Instructions (Signed)
Maintain knee immobilizer at all times. Do not bend your knee. Weightbearing as tolerated left lower extremity with knee immobilizer in place. Keep dressing clean and dry. Do not remove.

## 2017-07-08 ENCOUNTER — Encounter (HOSPITAL_COMMUNITY): Payer: Self-pay | Admitting: Orthopedic Surgery

## 2017-07-08 DIAGNOSIS — S76112A Strain of left quadriceps muscle, fascia and tendon, initial encounter: Secondary | ICD-10-CM | POA: Diagnosis not present

## 2017-07-08 LAB — BASIC METABOLIC PANEL
ANION GAP: 8 (ref 5–15)
BUN: 13 mg/dL (ref 6–20)
CALCIUM: 8.7 mg/dL — AB (ref 8.9–10.3)
CO2: 24 mmol/L (ref 22–32)
CREATININE: 0.82 mg/dL (ref 0.61–1.24)
Chloride: 107 mmol/L (ref 101–111)
GFR calc non Af Amer: >60 mL/min (ref >60)
GLUCOSE: 95 mg/dL (ref 65–99)
POTASSIUM: 4.1 mmol/L (ref 3.5–5.1)
Sodium: 139 mmol/L (ref 135–145)

## 2017-07-08 MED ORDER — ASPIRIN EC 325 MG PO TBEC: 325.0000 mg | DELAYED_RELEASE_TABLET | Freq: Two times a day (BID) | ORAL | 0 refills | Status: DC

## 2017-07-08 MED ORDER — DOCUSATE SODIUM 100 MG PO CAPS: 100.0000 mg | ORAL_CAPSULE | Freq: Two times a day (BID) | ORAL | 1 refills | Status: DC

## 2017-07-08 MED ORDER — SENNA 8.6 MG PO TABS: 1.0000 | ORAL_TABLET | Freq: Two times a day (BID) | ORAL | 0 refills | Status: DC

## 2017-07-08 NOTE — Evaluation (Signed)
Physical Therapy Evaluation Patient Details Name: Casey Gates MRN: [id number] DOB: [birth year] Today's Date: 07/08/2017   History of Present Illness  Pt is an 83 y/o M s/p Lt quad tendon repair. PMH: chronic back pain, blind Lt eye, CKD, DDD, kyphoscoliosis  Clinical Impression  Upon arrival pt eating lunch and MD educating pt on wound vac. Pt agreeable to mobilize with PT. Pt requires physical assistance with mobility due to posterior lean and decreased balance. Pt presents with short-term memory deficits and required reminders for use of knee immobilizer and wound vac. Pt presents with deficits listed in PT problem list below and will benefit from continued acute therapy for mobilization, safety, and DME education. Feel pt will be safe to discharge back to SNF and pt agreeable with this plan.      Follow Up Recommendations SNF;Supervision/Assistance - 24 hour    Equipment Recommendations  None recommended by PT    Recommendations for Other Services       Precautions / Restrictions Precautions Precautions: Fall Precaution Comments: Pt with wound vac for Lt LE Restrictions Weight Bearing Restrictions: Yes LLE Weight Bearing: Weight bearing as tolerated      Mobility  Bed Mobility Overal bed mobility: Needs Assistance Bed Mobility: Supine to Sit     Supine to sit: Min assist     General bed mobility comments: assist to elevate trunk from surface, cues for hand placement and increased time. pt maintains posterior lean in sitting  Transfers Overall transfer level: Needs assistance   Transfers: Sit to/from Stand Sit to Stand: Min assist         General transfer comment: cues for hand placement, assist to rise  Ambulation/Gait Ambulation/Gait assistance: Min assist Ambulation Distance (Feet): 15 Feet Assistive device: Rolling walker (2 wheeled) Gait Pattern/deviations: Decreased stride length;Shuffle;Trunk flexed   Gait velocity interpretation: Below normal speed  for age/gender General Gait Details: cues for posture, position in RW and safety with assist to limit posterior lean for anterior translation  Stairs            Wheelchair Mobility    Modified Rankin (Stroke Patients Only)       Balance Overall balance assessment: Needs assistance   Sitting balance-Leahy Scale: Poor       Standing balance-Leahy Scale: Poor                               Pertinent Vitals/Pain Pain Assessment: 0-10 Pain Score: 2  Pain Location: left foot Pain Descriptors / Indicators: Aching Pain Intervention(s): Limited activity within patient's tolerance;Repositioned    Home Living Family/patient expects to be discharged to:: Skilled nursing facility Living Arrangements: (P) Alone           Home Layout: One level Home Equipment: Walker - 2 wheels;Electric scooter      Prior Function Level of Independence: Needs assistance   Gait / Transfers Assistance Needed: Prior to knee pain, patient using RW/Rollator in apartment, and electric scooter to go to meals in facility. has been at [facility] 6 weeks with basic transfers to [facility]   ADL's / Homemaking Assistance Needed: Patient was sponge bathing, max assist for bathing and dressing for 6 wks since going to [place]        Extremity/Trunk Assessment   Upper Extremity Assessment Upper Extremity Assessment: Generalized weakness    Lower Extremity Assessment Lower Extremity Assessment: Generalized weakness  Cervical / Trunk Assessment Cervical / Trunk Assessment: Kyphotic  Communication   Communication: No difficulties  Cognition Arousal/Alertness: Awake/alert Behavior During Therapy: WFL for tasks assessed/performed Overall Cognitive Status: No family/caregiver present to determine baseline cognitive functioning Area of Impairment: Memory                     Memory: Decreased short-term memory         General Comments: needs  reinforcement and education for [facility] and KI use      General Comments      Exercises     Assessment/Plan    PT Assessment Patient needs continued PT services  PT Problem List Decreased strength;Decreased mobility;Decreased safety awareness;Decreased range of motion;Decreased activity tolerance;Decreased balance;Decreased knowledge of use of DME;Pain;Decreased cognition;Decreased knowledge of precautions       PT Treatment Interventions DME instruction;Gait training;Therapeutic exercise;Patient/family education;Functional mobility training;Therapeutic activities    PT Goals (Current goals can be found in the Care Plan section)  Acute Rehab PT Goals Patient Stated Goal: go home, walk PT Goal Formulation: With patient Time For Goal Achievement: 07/15/17 Potential to Achieve Goals: Fair    Frequency Min 3X/week   Barriers to discharge        Co-evaluation               AM-PAC PT "6 Clicks" Daily Activity  Outcome Measure Difficulty turning over in bed (including adjusting bedclothes, sheets and blankets)?: Total Difficulty moving from lying on back to sitting on the side of the bed? : Total Difficulty sitting down on and standing up from a chair with arms (e.g., wheelchair, bedside commode, etc,.)?: Total Help needed moving to and from a bed to chair (including a wheelchair)?: A Little Help needed walking in hospital room?: A Little Help needed climbing 3-5 steps with a railing? : A Lot 6 Click Score: 11    End of Session Equipment Utilized During Treatment: Gait belt;Left knee immobilizer Activity Tolerance: Patient tolerated treatment well Patient left: in chair;with chair alarm set;with nursing/sitter in room Nurse Communication: Mobility status;Weight bearing status;Precautions PT Visit Diagnosis: Other abnormalities of gait and mobility (R26.89);Unsteadiness on feet (R26.81);Muscle weakness (generalized) (M62.81);Repeated falls (R29.6)    Time: 1323-1350 PT  Time Calculation (min) (ACUTE ONLY): 27 min   Charges:   PT Evaluation $PT Eval Moderate Complexity: 1 Mod     PT G Codes:   PT G-Codes **NOT FOR INPATIENT CLASS** Functional Assessment Tool Used: AM-PAC 6 Clicks Basic Mobility;Clinical judgement Functional Limitation: Mobility: Walking and moving around Mobility: Walking and Moving Around Current Status (Y8144): At least 40 percent but less than 60 percent impaired, limited or restricted Mobility: Walking and Moving Around Goal Status [phone]): At least 20 percent but less than 40 percent impaired, limited or restricted  Elwyn Reach, [place]    [place] 07/08/2017, 1:59 PM

## 2017-07-08 NOTE — NC FL2 (Signed)
[place] MEDICAID FL2 LEVEL OF CARE SCREENING TOOL     IDENTIFICATION  Patient Name: Casey Gates Birthdate: [birth year] Sex: male Admission Date (Current Location): 07/07/2017  [facility] and [place] Number:  Herbalist and Address:  [facility]. [facility], [place] [address] [place], [place] [address]      Provider Number: [id number]  Attending Physician Name and Address:  Rod Can, MD  Relative Name and Phone Number:       Current Level of Care: Hospital Recommended Level of Care: [place] Prior Approval Number:    Date Approved/Denied: 07/08/17 PASRR Number: [id number]  Discharge Plan: SNF    Current Diagnoses: Patient Active Problem List   Diagnosis Date Noted  . Rupture of left quadriceps muscle 07/07/2017  . Rupture of left quadriceps tendon 06/26/2017  . Eczema 06/26/2017  . Chest pain 07/29/2015  . OSA on CPAP 07/10/2015  . CPAP use counseling 07/10/2015  . Excessive daytime sleepiness 02/22/2015  . Sleep apnea 02/22/2015  . Snoring 02/22/2015  . Nocturia more than twice per night 02/22/2015  . Syncope 12/31/2012  . Bifascicular bundle branch block 12/31/2012    Class: Acute  . Cellulitis and abscess of leg, except foot 12/04/2012  . Chronic pain 12/04/2012  . Kyphosis 12/04/2012  . H/O unilateral nephrectomy 12/04/2012  . H/O prostate cancer 12/04/2012  . S/P TURP 12/04/2012    Orientation RESPIRATION BLADDER Height & Weight     Self, Time, Situation, Place  Normal Continent Weight: 151 lb 14.3 oz (68.9 kg) Height:  5\' 9"  (175.3 cm)  BEHAVIORAL SYMPTOMS/MOOD NEUROLOGICAL BOWEL NUTRITION STATUS      Continent Diet (See DC Summary)  AMBULATORY STATUS COMMUNICATION OF NEEDS Skin   Limited Assist Verbally Wound Vac, Surgical wounds (Left knee closed incision. wound vac)                       Personal Care Assistance Level of Assistance  Bathing, Feeding, Dressing Bathing Assistance: Maximum  assistance Feeding assistance: Limited assistance Dressing Assistance: Maximum assistance     Functional Limitations Info  Sight Sight Info: Impaired        SPECIAL CARE FACTORS FREQUENCY  PT (By licensed PT), OT (By licensed OT)     PT Frequency: 3x week OT Frequency: 3x week            Contractures      Additional Factors Info  Code Status, Allergies, Psychotropic Code Status Info: Full Code Allergies Info: PEANUTS PEANUT OIL, COCONUT FLAVOR FLAVORING AGENT, COCONUT OIL  Psychotropic Info: Remeron         Current Medications (07/08/2017):  This is the current hospital active medication list Current Facility-Administered Medications  Medication Dose Route Frequency Provider Last Rate Last Dose  . acetaminophen (TYLENOL) tablet 650 mg  650 mg Oral Q6H PRN Enid Maultsby, Aaron Edelman, MD       Or  . acetaminophen (TYLENOL) suppository 650 mg  650 mg Rectal Q6H PRN Catia Todorov, Aaron Edelman, MD      . bisacodyl (DULCOLAX) suppository 10 mg  10 mg Rectal Daily PRN Jalan Bodi, Aaron Edelman, MD      . docusate sodium (COLACE) capsule 100 mg  100 mg Oral BID Rod Can, MD   100 mg at 07/08/17 6734  . enoxaparin (LOVENOX) injection 30 mg  30 mg Subcutaneous Q24H Rod Can, MD   30 mg at 07/08/17 0808  . feeding supplement (BOOST / RESOURCE BREEZE) liquid 1 Container  1 Container  Oral TID BM Verniece Encarnacion, Aaron Edelman, MD      . gabapentin (NEURONTIN) capsule 300 mg  300 mg Oral BID Rod Can, MD   300 mg at 07/08/17 0808  . HYDROmorphone (DILAUDID) injection 0.5-1 mg  0.5-1 mg Intravenous Q2H PRN Gaylin Bulthuis, Aaron Edelman, MD      . magnesium hydroxide (MILK OF MAGNESIA) suspension 30 mL  30 mL Oral Daily PRN Romero Letizia, Aaron Edelman, MD      . metoCLOPramide (REGLAN) tablet 5-10 mg  5-10 mg Oral Q8H PRN Vernella Niznik, Aaron Edelman, MD       Or  . metoCLOPramide (REGLAN) injection 5-10 mg  5-10 mg Intravenous Q8H PRN Algis Lehenbauer, Aaron Edelman, MD      . mirtazapine (REMERON) tablet 15 mg  15 mg Oral QHS Rod Can, MD   15 mg at  07/07/17 2203  . morphine (MSIR) tablet 45 mg  45 mg Oral Q4H PRN Quincie Haroon, Aaron Edelman, MD      . ondansetron [facility]) tablet 4 mg  4 mg Oral Q6H PRN Rever Pichette, Aaron Edelman, MD       Or  . ondansetron (ZOFRAN) injection 4 mg  4 mg Intravenous Q6H PRN Patriciann Becht, Aaron Edelman, MD      . polyethylene glycol (MIRALAX / GLYCOLAX) packet 17 g  17 g Oral BID Rod Can, MD   17 g at 07/08/17 3736  . senna (SENOKOT) tablet 8.6 mg  1 tablet Oral BID Rod Can, MD   8.6 mg at 07/08/17 0808     Discharge Medications: Please see discharge summary for a list of discharge medications.  Relevant Imaging Results:  Relevant Lab Results:   Additional Information SS#:[id number] [place], LCSW

## 2017-07-08 NOTE — Evaluation (Signed)
Occupational Therapy Evaluation Patient Details Name: Casey Gates MRN: [id number] DOB: 1926-07-03 Today's Date: 07/08/2017    History of Present Illness Pt is an 83 y/o M s/p Lt quad tendon repair. PMH: chronic back pain, blind Lt eye, CKD, DDD, kyphoscoliosis   Clinical Impression   Pt currently needs min to mod assist for basic selfcare tasks and functional transfers to the [facility].  Feel he is not safe to return back to his apartment and will need 24 hour supervision/assist and further therapy at SNF level to achieve this.     Follow Up Recommendations  SNF;Supervision/Assistance - 24 hour    Equipment Recommendations  Other (comment) (TBD next venue of care)       Precautions / Restrictions Precautions Precautions: Fall Precaution Comments: Pt with wound vac for Lt LE Required Braces or Orthoses: Knee Immobilizer - Left Knee Immobilizer - Left: On at all times Restrictions Weight Bearing Restrictions: Yes LLE Weight Bearing: Weight bearing as tolerated      Mobility Bed Mobility Overal bed mobility: Needs Assistance Bed Mobility: Supine to Sit     Supine to sit: Min assist     General bed mobility comments: assist to elevate trunk from surface, cues for hand placement and increased time. pt maintains posterior lean in sitting  Transfers Overall transfer level: Needs assistance Equipment used: Rolling walker (2 wheeled) Transfers: Sit to/from Stand Sit to Stand: Min assist         General transfer comment: cues for hand placement, assist to rise    Balance Overall balance assessment: Needs assistance   Sitting balance-Leahy Scale: Poor Sitting balance - Comments: Needs back support for bringing RLE up to donn sock.     Standing balance support: Bilateral upper extremity supported Standing balance-Leahy Scale: Poor Standing balance comment: Pt needs BUE support on walker for balance and support.                            ADL either performed or  assessed with clinical judgement   ADL Overall ADL's : Needs assistance/impaired Eating/Feeding: Independent;Sitting   Grooming: Wash/dry hands;Wash/dry face   Upper Body Bathing: Supervision/ safety;Sitting   Lower Body Bathing: Moderate assistance;Sit to/from stand   Upper Body Dressing : Sitting;Minimal assistance   Lower Body Dressing: Moderate assistance;Sit to/from stand   Toilet Transfer: Minimal assistance;BSC;Stand-pivot   Toileting- Clothing Manipulation and Hygiene: Minimal assistance;Sit to/from stand       Functional mobility during ADLs: Minimal assistance;Rolling walker General ADL Comments: Pt able to take 2-3 small steps with use of the RW and min assist to pivot around to the toilet with KI in place on the LLE.      Vision Baseline Vision/History: Wears glasses Wears Glasses: Reading only Patient Visual Report: No change from baseline Vision Assessment?: No apparent visual deficits            Pertinent Vitals/Pain Pain Assessment: Faces Pain Score: 2  Faces Pain Scale: Hurts a little bit Pain Location: left knee Pain Descriptors / Indicators: Discomfort Pain Intervention(s): Repositioned;Monitored during session     Hand Dominance Right   Extremity/Trunk Assessment Upper Extremity Assessment Upper Extremity Assessment: Overall WFL for tasks assessed   Lower Extremity Assessment Lower Extremity Assessment: Defer to PT evaluation   Cervical / Trunk Assessment Cervical / Trunk Assessment: Kyphotic   Communication Communication Communication: No difficulties   Cognition Arousal/Alertness: Awake/alert Behavior During Therapy: WFL for tasks assessed/performed Overall Cognitive Status: No  family/caregiver present to determine baseline cognitive functioning Area of Impairment: Memory                     Memory: Decreased short-term memory         General Comments: needs reinforcement and education for [facility] and KI use   General  Comments               Home Living Family/patient expects to be discharged to:: [place]: One level               Home Equipment: Walker - 2 wheels;Electric scooter   Additional Comments: Scooter is at [employer] where pt was staying prior to recent admission to SNF.      Prior Functioning/Environment Level of Independence: Needs assistance  Gait / Transfers Assistance Needed: Prior to knee pain, patient using RW/Rollator in apartment, and electric scooter to go to meals in facility. has been at [facility] 6 weeks with basic transfers to [facility]  ADL's / Homemaking Assistance Needed: Patient was sponge bathing, max assist for bathing and dressing for 6 wks since going to [place]            OT Problem List: Decreased strength;Impaired balance (sitting and/or standing);Pain;Decreased knowledge of use of DME or AE      OT Treatment/Interventions:      OT Goals(Current goals can be found in the care plan section) Acute Rehab OT Goals Patient Stated Goal: go home, walk  OT Frequency:                AM-PAC PT "6 Clicks" Daily Activity     Outcome Measure Help from another person eating meals?: None Help from another person taking care of personal grooming?: A Little Help from another person toileting, which includes using toliet, bedpan, or urinal?: A Little Help from another person bathing (including washing, rinsing, drying)?: A Little Help from another person to put on and taking off regular upper body clothing?: None Help from another person to put on and taking off regular lower body clothing?: A Lot 6 Click Score: 19   End of Session Equipment Utilized During Treatment: Rolling walker;Left knee immobilizer Nurse Communication: Mobility status  Activity Tolerance: Patient tolerated treatment well Patient left: in chair;with call bell/phone within reach;with chair alarm set  OT Visit  Diagnosis: Unsteadiness on feet (R26.81);Repeated falls (R29.6);History of falling (Z91.81)                Time: 3734-2876 OT Time Calculation (min): 30 min Charges:  OT General Charges $OT Visit: 1 Procedure OT Evaluation $OT Eval Moderate Complexity: 1 Procedure OT Treatments $Self Care/Home Management : 23-37 mins G-Codes: OT G-codes **NOT FOR INPATIENT CLASS** Functional Assessment Tool Used: AM-PAC 6 Clicks Daily Activity Functional Limitation: Self care Self Care Current Status (O1157): At least 60 percent but less than 80 percent impaired, limited or restricted Self Care Goal Status (W6203): At least 60 percent but less than 80 percent impaired, limited or restricted Self Care Discharge Status [phone]): At least 60 percent but less than 80 percent impaired, limited or restricted     Bertram OTR/L 07/08/2017, 3:20 PM

## 2017-07-08 NOTE — Discharge Summary (Signed)
Physician Discharge Summary  Patient ID: Casey Gates MRN: [id number] DOB/AGE: 07-May-1926 83 y.o.  Admit date: 07/07/2017 Discharge date: 07/08/2017  Admission Diagnoses:  Rupture of left quadriceps tendon  Discharge Diagnoses:  Principal Problem:   Rupture of left quadriceps tendon Active Problems:   Rupture of left quadriceps muscle   Past Medical History:  Diagnosis Date  . Asthma    AS A CHILD  . Blind left eye   . Cancer [facility])    PROSTATE CANCER  . Cellulitis    frequently, "all over"  . Chronic kidney disease   . Chronic pain syndrome   . Constipation   . DDD (degenerative disc disease), lumbar   . Insomnia   . Kyphoscoliosis   . Restless leg     Surgeries: Procedure(s): REPAIR QUADRICEP TENDON on 07/07/2017   Consultants (if any):   Discharged Condition: Improved  Hospital Course: Casey Gates is an 83 y.o. male who was admitted 07/07/2017 with a diagnosis of Rupture of left quadriceps tendon and went to the operating room on 07/07/2017 and underwent the above named procedures.    He was given perioperative antibiotics:  Anti-infectives    Start     Dose/Rate Route Frequency Ordered Stop   07/07/17 1146  ceFAZolin (ANCEF) IVPB 2g/100 mL premix     2 g 200 mL/hr over 30 Minutes Intravenous On call to O.R. 07/07/17 1146 07/07/17 1530    .  He was given sequential compression devices, early ambulation, and lovenox in house --> home on ASA for DVT prophylaxis.  Knee immobilizer at all times. Weight bearing as tolerated.   He benefited maximally from the hospital stay and there were no complications.    Recent vital signs:  Vitals:   07/08/17 0122 07/08/17 0621  BP: (!) 108/59 105/65  Pulse: 87 78  Resp: 20 17  Temp: 98.2 F (36.8 C) 98.3 F (36.8 C)    Recent laboratory studies:  Lab Results  Component Value Date   HGB 14.9 07/07/2017   HGB 14.4 06/19/2017   HGB 14.7 05/24/2017   Lab Results  Component Value Date   WBC 9.5 07/07/2017   PLT 176  07/07/2017   Lab Results  Component Value Date   INR 1.06 05/24/2017   Lab Results  Component Value Date   NA 139 07/08/2017   K 4.1 07/08/2017   CL 107 07/08/2017   CO2 24 07/08/2017   BUN 13 07/08/2017   CREATININE 0.82 07/08/2017   GLUCOSE 95 07/08/2017    Discharge Medications:   Allergies as of 07/08/2017      Reactions   Peanuts [peanut Oil] Shortness Of Breath   Shortness of breath---all nuts, but can have peanuts, cashews, and almonds   Coconut Flavor [flavoring Agent]    Anything that is related to coconut   Coconut Oil Other (See Comments)   Unknown childhood reaction.        Medication List    STOP taking these medications   neomycin-bacitracin-polymyxin [phone] ointment   triamcinolone ointment 0.1 % Commonly known as:  KENALOG     TAKE these medications   acetaminophen 500 MG tablet Commonly known as:  TYLENOL Take 500 mg by mouth every 6 (six) hours as needed (for pain.).   aspirin EC 325 MG tablet Take 1 tablet (325 mg total) by mouth 2 (two) times daily after a meal.   bisacodyl 10 MG suppository Commonly known as:  DULCOLAX Place 10 mg rectally daily as needed (for constipation).  docusate sodium 100 MG capsule Commonly known as:  COLACE Take 1 capsule (100 mg total) by mouth 2 (two) times daily.   gabapentin 300 MG capsule Commonly known as:  NEURONTIN Take 300 mg by mouth 2 (two) times daily. (0900 & 2100)   magnesium hydroxide 400 MG/5ML suspension Commonly known as:  MILK OF MAGNESIA Take 30 mLs by mouth daily as needed (for constipation).   mirtazapine 15 MG tablet Commonly known as:  REMERON Take 15 mg by mouth at bedtime. (2100)   NUTRITIONAL SHAKE COMPLETE PO Take 1 Dose by mouth 2 (two) times daily. Health Shake two times daily   oxymorphone 10 MG tablet Commonly known as:  OPANA Take 2 tablets (20 mg total) by mouth 2 (two) times daily as needed for pain.   polyethylene glycol packet Commonly known as:  MIRALAX /  GLYCOLAX Take 17 g by mouth 2 (two) times daily.   RA SALINE ENEMA RE Place 1 Bottle rectally daily as needed (for constipation).   senna 8.6 MG Tabs tablet Commonly known as:  SENOKOT Take 1 tablet (8.6 mg total) by mouth 2 (two) times daily.       Diagnostic Studies: No results found.  Disposition: 03-Skilled Nursing Facility  Discharge Instructions    Call MD / Call 911    Complete by:  As directed    If you experience chest pain or shortness of breath, CALL 911 and be transported to the hospital emergency room.  If you develope a fever above 101 F, pus (white drainage) or increased drainage or redness at the wound, or calf pain, call your surgeon's office.   Constipation Prevention    Complete by:  As directed    Drink plenty of fluids.  Prune juice may be helpful.  You may use a stool softener, such as Colace (over the counter) 100 mg twice a day.  Use MiraLax (over the counter) for constipation as needed.   Diet - low sodium heart healthy    Complete by:  As directed    Discharge instructions    Complete by:  As directed    Knee immobilizer at all times DO NOT bend the left knee weight bearing as tolerated in knee immobilizer Keep dressing clean and dry. Do not remove. Charge the Prevena VAC during night time daily   Increase activity slowly as tolerated    Complete by:  As directed       Follow-up Information    Mistey Hoffert, Aaron Edelman, MD. Schedule an appointment as soon as possible for a visit on 07/15/2017.   Specialty:  Orthopedic Surgery Why:  For wound VAC removal Contact information: [address] [address] [phone]            Signed: Elie Goody 07/08/2017, 1:24 PM

## 2017-07-08 NOTE — Anesthesia Postprocedure Evaluation (Signed)
Anesthesia Post Note  Patient: Romir Klimowicz  Procedure(s) Performed: Procedure(s) (LRB): REPAIR QUADRICEP TENDON (Left)     Patient location during evaluation: PACU Anesthesia Type: General Level of consciousness: awake and alert Pain management: pain level controlled Vital Signs Assessment: post-procedure vital signs reviewed and stable Respiratory status: spontaneous breathing, nonlabored ventilation, respiratory function stable and patient connected to nasal cannula oxygen Cardiovascular status: blood pressure returned to baseline and stable Postop Assessment: no signs of nausea or vomiting Anesthetic complications: no    Last Vitals:  Vitals:   07/08/17 0122 07/08/17 0621  BP: (!) 108/59 105/65  Pulse: 87 78  Resp: 20 17  Temp: 36.8 C 36.8 C    Last Pain:  Vitals:   07/08/17 0621  TempSrc: Oral  PainSc:                  Montez Hageman

## 2017-07-08 NOTE — Clinical Social Work Note (Signed)
Clinical Social Work Assessment  Patient Details  Name: Casey Gates MRN: [id number] Date of Birth: March 25, 1926  Date of referral:  07/08/17               Reason for consult:  Facility Placement                Permission sought to share information with:    Permission granted to share information::  Yes, Verbal Permission Granted  Name::        Agency::  [facility]  Relationship::     Contact Information:     Housing/Transportation Living arrangements for the past 2 months:  [place] of Information:  Patient Patient Interpreter Needed:  None Criminal Activity/Legal Involvement Pertinent to Current Situation/Hospitalization:  No - Comment as needed Significant Relationships:  Adult Children Lives with:  Self Do you feel safe going back to the place where you live?  No Need for family participation in patient care:  No (Coment)  Care giving concerns:  Pt resides at [place], however was at [facility] for impairment for the past 6 weeks. Patient reports that he has had many falls at [facility] and utilizes. Patient will return to SNF for continued short term rehab to address impairment.  Social Worker assessment / plan:  CSW met with patient at bedside to confirm his return to [place]. Pt agree with returning to SNF for continued short term rehab. CSW confirmed placement at [facility] and they are waiting to take him back.  FL2 pending OT evaluation. Passr confirmed. Offer will be sent to [facility].  Employment status:  Retired Nurse, adult PT Recommendations:  [name] / Referral to community resources:  [place]  Patient/Family's Response to care:  Patient appreciative of CSW assistance. No issues or concerns reported.  Patient/Family's Understanding of and Emotional Response to Diagnosis, Current Treatment, and Prognosis:  Patient  has good understanding of diagnosis, current treatment and prognosis and is hopeful that impairment will improve with SNF.  CSW explained her role and returning to [place] at DC.   FL2 complete. Passr confirmed. Offer sent to [facility].  Emotional Assessment Appearance:  Appears stated age Attitude/Demeanor/Rapport:   (Cooperative) Affect (typically observed):  Accepting, Appropriate Orientation:  Oriented to Self, Oriented to Place, Oriented to  Time, Oriented to Situation Alcohol / Substance use:  Not Applicable Psych involvement (Current and /or in the community):  No (Comment)  Discharge Needs  Concerns to be addressed:  Care Coordination Readmission within the last 30 days:  Yes Current discharge risk:  Physical Impairment, Dependent with Mobility Barriers to Discharge:  No Barriers Identified   Casey Baxter, LCSW 07/08/2017, 2:19 PM

## 2017-07-08 NOTE — Progress Notes (Signed)
PT Cancellation Note  Patient Details Name: Casey Gates MRN: [id number] DOB: 09/29/1926   Cancelled Treatment:    Reason Eval/Treat Not Completed: Other (comment) (await weight bearing status and clarification of orders prior to initiation of mobility and evaluation)   Casey Gates 07/08/2017, 10:31 AM  Elwyn Reach, [place]

## 2017-07-08 NOTE — Progress Notes (Signed)
   Subjective:  Patient reports pain as mild.  No c/o.  Objective:   VITALS:   Vitals:   07/07/17 2035 07/07/17 2111 07/08/17 0122 07/08/17 0621  BP: (!) 100/55 (!) 86/62 (!) 108/59 105/65  Pulse: 84 84 87 78  Resp: (!) 21 19 20 17   Temp: 97.7 F (36.5 C) 98.6 F (37 C) 98.2 F (36.8 C) 98.3 F (36.8 C)  TempSrc:  Oral Oral Oral  SpO2: 94% 96% 95% 97%  Weight:      Height:       NAD ABD soft Sensation intact distally Intact pulses distally Dorsiflexion/Plantar flexion intact Incision: dressing C/D/I Compartment soft VAC intact KI in place  Lab Results  Component Value Date   WBC 9.5 07/07/2017   HGB 14.9 07/07/2017   HCT 45.9 07/07/2017   MCV 91.3 07/07/2017   PLT 176 07/07/2017   BMET    Component Value Date/Time   NA 139 07/08/2017 0632   K 4.1 07/08/2017 0632   CL 107 07/08/2017 0632   CO2 24 07/08/2017 0632   GLUCOSE 95 07/08/2017 0632   BUN 13 07/08/2017 0632   CREATININE 0.82 07/08/2017 0632   CALCIUM 8.7 (L) 07/08/2017 0632   GFRNONAA >60 07/08/2017 0632   GFRAA >60 07/08/2017 4142     Assessment/Plan: 1 Day Post-Op   Active Problems:   Rupture of left quadriceps tendon   Rupture of left quadriceps muscle   KI at all times DO NOT bend the knee WBAT prevena VAC PT/OT D/C back to SNF today   Tal Kempker, Horald Pollen 07/08/2017, 1:18 PM   Rod Can, MD Cell [phone]

## 2017-07-08 NOTE — Social Work (Addendum)
Clinical Social Worker facilitated patient discharge including contacting patient family and facility to confirm patient discharge plans.  Clinical information faxed to facility and family agreeable with plan.   CSW arranged ambulance transport via [facility] to [place].   RN to call [phone] to give report prior to discharge. Room 211.  Clinical Social Worker will sign off for now as social work intervention is no longer needed. Please consult [place] again if new need arises.  Elissa Hefty, LCSW Clinical Social Worker [phone]

## 2017-07-08 NOTE — Care Management Obs Status (Signed)
[place] NOTIFICATION   Patient Details  Name: Dakai Braithwaite MRN: [id number] Date of Birth: 09/11/1926   Medicare Observation Status Notification Given:  Yes    Ninfa Meeker, RN 07/08/2017, 2:53 PM

## 2017-07-08 NOTE — Clinical Social Work Placement (Signed)
   CLINICAL SOCIAL WORK PLACEMENT  NOTE  Date:  07/08/2017  Patient Details  Name: Casey Gates MRN: [id number] Date of Birth: 04-10-1926  Clinical Social Work is seeking post-discharge placement for this patient at the [place] level of care (*CSW will initial, date and re-position this form in  chart as items are completed):  Yes   Patient/family provided with [place] Work Department's list of facilities offering this level of care within the geographic area requested by the patient (or if unable, by the patient's family).  Yes   Patient/family informed of their freedom to choose among providers that offer the needed level of care, that participate in Medicare, Medicaid or managed care program needed by the patient, have an available bed and are willing to accept the patient.  Yes   Patient/family informed of [facility]'s ownership interest in [facility] and [facility], as well as of the fact that they are under no obligation to receive care at these facilities.  PASRR submitted to EDS on       PASRR number received on       Existing PASRR number confirmed on 07/08/17     FL2 transmitted to all facilities in geographic area requested by pt/family on       FL2 transmitted to all facilities within larger geographic area on 07/08/17     Patient informed that his/her managed care company has contracts with or will negotiate with certain facilities, including the following:        Yes   Patient/family informed of bed offers received.  Patient chooses bed at [place] recommends and patient chooses bed at      Patient to be transferred to [facility] on 07/08/17.  Patient to be transferred to facility by [facility]     Patient family notified on 07/08/17 of transfer.  Name of family member notified:  Patient responsible for self and he indicated he has already called son.  PHYSICIAN Please  prepare priority discharge summary, including medications, Please prepare prescriptions, Please sign FL2     Additional Comment:    _______________________________________________ Normajean Baxter, LCSW 07/08/2017, 3:28 PM

## 2017-07-09 LAB — CBC AND DIFFERENTIAL
HCT: 45 (ref 41–53)
Hemoglobin: 14.9 (ref 13.5–17.5)
Platelets: 142 — AB (ref 150–399)
WBC: 12.8

## 2017-07-10 LAB — BASIC METABOLIC PANEL
BUN: 16 (ref 4–21)
CREATININE: 0.8 (ref 0.6–1.3)
Glucose: 91
POTASSIUM: 5 (ref 3.4–5.3)
SODIUM: 137 (ref 137–147)

## 2017-07-11 ENCOUNTER — Ambulatory Visit: Payer: Medicare Other | Admitting: Podiatry

## 2017-07-16 ENCOUNTER — Encounter: Payer: Self-pay | Admitting: Internal Medicine

## 2017-07-16 ENCOUNTER — Non-Acute Institutional Stay (SKILLED_NURSING_FACILITY): Payer: Medicare Other | Admitting: Internal Medicine

## 2017-07-16 DIAGNOSIS — S76112S Strain of left quadriceps muscle, fascia and tendon, sequela: Secondary | ICD-10-CM | POA: Diagnosis not present

## 2017-07-16 DIAGNOSIS — I959 Hypotension, unspecified: Secondary | ICD-10-CM | POA: Diagnosis not present

## 2017-07-16 DIAGNOSIS — G4700 Insomnia, unspecified: Secondary | ICD-10-CM

## 2017-07-16 DIAGNOSIS — K5900 Constipation, unspecified: Secondary | ICD-10-CM

## 2017-07-16 NOTE — Progress Notes (Signed)
Location:  [place] Room Number: 211-A Place of Service:  SNF (31) Provider:  Rene Kocher. Lyndel Safe, MD   Patient Care Team: Velna Hatchet, MD as PCP - General (Internal Medicine)  Extended Emergency Contact Information Primary Emergency Contact: Dawayne Patricia of [place] Phone: [phone] Mobile Phone: [phone] Relation: Son  Code Status:  DNR Goals of care: Advanced Directive information Advanced Directives 07/16/2017  Does Patient Have a Medical Advance Directive? Yes  Type of Advance Directive Out of facility DNR (pink MOST or yellow form)  Does patient want to make changes to medical advance directive? No - Patient declined  Copy of [place] in Chart? No - copy requested  Would patient like information on creating a medical advance directive? -  Pre-existing out of facility DNR order (yellow form or pink MOST form) -     Chief Complaint  Patient presents with  . Readmit To SNF    Readmission to [facility] S/P surgery    HPI:  Pt is a 83 y.o. male seen today for medical management of chronic diseases.  Patient underwent Repair of Left Quadricep Tendon repair on 08/06. After reviewing patients chart and getting history from patient . He has had multiple falls in the independent apartment he lives in [place]. Last Ed visit he was found to have Left knee pain , Fever and cellulitis. He was treated with Antibiotics. But since his pain persisted he was seen by Ortho and was found to have Quadricep Tendon tear. Since he was unable to put any weight on his leg he underwent surgery for repair.   He also had  Wound vac for his Left medial thigh wound which has now healed. Patient Lives in [place]. Was very independent before this. He is hoping he will be able to go back to his apartment when he is able to walk. His pain is controlled and he is requiring no more Opioids anymore. He has not put  any weight on that leg yet.   Past Medical History:  Diagnosis Date  . Asthma    AS A CHILD  . Blind left eye   . Cancer [facility])    PROSTATE CANCER  . Cellulitis    frequently, "all over"  . Chronic kidney disease   . Chronic pain syndrome   . Constipation   . DDD (degenerative disc disease), lumbar   . Insomnia   . Kyphoscoliosis   . Restless leg    Past Surgical History:  Procedure Laterality Date  . CHOLECYSTECTOMY    . COLONOSCOPY    . PROSTATECTOMY    . QUADRICEPS TENDON REPAIR Left 07/07/2017   Procedure: REPAIR QUADRICEP TENDON;  Surgeon: Rod Can, MD;  Location: [place];  Service: Orthopedics;  Laterality: Left;  . RIGHT CATARACT EXTRACTION  2013  . RIGTH NEPHRECTOMY  2006  . TONSILLECTOMY    . TRANSURETHRAL RESECTION OF PROSTATE  1997    Allergies  Allergen Reactions  . Peanuts [Peanut Oil] Shortness Of Breath    Shortness of breath---all nuts, but can have peanuts, cashews, and almonds  . Coconut Flavor [Flavoring Agent]     Anything that is related to coconut  . Coconut Oil Other (See Comments)    Unknown childhood reaction.      Outpatient Encounter Prescriptions as of 07/16/2017  Medication Sig  . acetaminophen (TYLENOL) 325 MG tablet Take 650 mg by mouth every 6 (six) hours as needed for fever.  Marland Kitchen  acetaminophen (TYLENOL) 500 MG tablet Take 500 mg by mouth every 6 (six) hours as needed (for pain.).  Marland Kitchen aspirin EC 325 MG tablet Take 1 tablet (325 mg total) by mouth 2 (two) times daily after a meal.  . bisacodyl (DULCOLAX) 10 MG suppository Place 10 mg rectally daily as needed (for constipation).  Marland Kitchen docusate sodium (COLACE) 100 MG capsule Take 1 capsule (100 mg total) by mouth 2 (two) times daily.  Marland Kitchen gabapentin (NEURONTIN) 300 MG capsule Take 300 mg by mouth 2 (two) times daily. (0900 & 2100)  . magnesium hydroxide (MILK OF MAGNESIA) 400 MG/5ML suspension Take 30 mLs by mouth daily as needed (for constipation).  . mirtazapine (REMERON) 15 MG tablet Take  15 mg by mouth at bedtime. (2100)  . Nutritional Supplements (NUTRITIONAL SHAKE COMPLETE PO) Take 1 Dose by mouth 2 (two) times daily. Health Shake two times daily  . oxymorphone (OPANA) 10 MG tablet Take 10 mg by mouth every 12 (twelve) hours as needed for pain.  . polyethylene glycol (MIRALAX / GLYCOLAX) packet Take 17 g by mouth 2 (two) times daily.  Marland Kitchen senna (SENOKOT) 8.6 MG TABS tablet Take 1 tablet (8.6 mg total) by mouth 2 (two) times daily.  . Sodium Phosphates (RA SALINE ENEMA RE) Place 1 Bottle rectally daily as needed (for constipation).  . [DISCONTINUED] oxymorphone (OPANA) 10 MG tablet Take 2 tablets (20 mg total) by mouth 2 (two) times daily as needed for pain.   No facility-administered encounter medications on file as of 07/16/2017.     Review of Systems  Review of Systems  Constitutional: Negative for activity change, appetite change, chills, diaphoresis, fatigue and fever.  HENT: Negative for mouth sores, postnasal drip, rhinorrhea, sinus pain and sore throat.   Respiratory: Negative for apnea, cough, chest tightness, shortness of breath and wheezing.   Cardiovascular: Negative for chest pain, palpitations and leg swelling.  Gastrointestinal: Negative for abdominal distention, abdominal pain, constipation, diarrhea, nausea and vomiting.  Genitourinary: Negative for dysuria and frequency.  Musculoskeletal: Negative for arthralgias, joint swelling and myalgias.  Skin: Negative for rash.  Neurological: Negative for dizziness, syncope, weakness, light-headedness and numbness.  Psychiatric/Behavioral: Negative for behavioral problems, confusion and sleep disturbance.     Immunization History  Administered Date(s) Administered  . Influenza-Unspecified 10/02/2013, 08/02/2014   Pertinent  Health Maintenance Due  Topic Date Due  . PNA vac Low Risk Adult (1 of 2 - PCV13) 10/11/1993  . INFLUENZA VACCINE  07/02/2017   No flowsheet data found. Functional Status Survey:     Vitals:   07/16/17 0859  BP: 95/63  Pulse: 71  Resp: 18  Temp: 98.6 F (37 C)  TempSrc: Oral  SpO2: 94%  Weight: 144 lb 12.8 oz (65.7 kg)  Height: 5\' 9"  (1.753 m)   Body mass index is 21.38 kg/m. Physical Exam  Constitutional: He is oriented to person, place, and time. He appears well-developed and well-nourished.  HENT:  Head: Normocephalic.  Mouth/Throat: Oropharynx is clear and moist.  Eyes: Pupils are equal, round, and reactive to light.  Neck: Neck supple.  Cardiovascular: Normal rate, regular rhythm and normal heart sounds.   Pulmonary/Chest: Effort normal and breath sounds normal. No respiratory distress. He has no wheezes. He has no rales.  Abdominal: Soft. Bowel sounds are normal. He exhibits no distension. There is no tenderness. There is no rebound.  Musculoskeletal: He exhibits no edema.  Neurological: He is alert and oriented to person, place, and time.  No Focal deficit except  surgical leg was not checked.  Skin: Skin is warm and dry.  Psychiatric: He has a normal mood and affect. His behavior is normal. Thought content normal.    Labs reviewed:  Recent Labs  06/19/17 1205 07/07/17 1153 07/08/17 0632 07/10/17  NA 141 142 139 137  K 4.3 4.1 4.1 5.0  CL 107 111 107  --   CO2 27 24 24   --   GLUCOSE 100* 88 95  --   BUN 12 15 13 16   CREATININE 0.96 0.92 0.82 0.8  CALCIUM 9.1 8.9 8.7*  --     Recent Labs  07/20/16 0810 05/24/17 1631  AST 25 31  ALT 21 34  ALKPHOS 58 85  BILITOT 0.8 0.9  PROT 5.9* 6.5  ALBUMIN 3.6 3.7    Recent Labs  07/20/16 0810 05/24/17 1631 06/19/17 1205 07/07/17 1153 07/09/17  WBC 6.6 11.7* 8.6 9.5 12.8  NEUTROABS 3.6 8.0*  --   --   --   HGB 13.3 14.7 14.4 14.9 14.9  HCT 41.1 44.6 45.3 45.9 45  MCV 89.7 90.5 91.7 91.3  --   PLT 157 167 190 176 142*   Lab Results  Component Value Date   TSH 1.468 12/31/2012   No results found for: HGBA1C No results found for: CHOL, HDL, LDLCALC, LDLDIRECT, TRIG,  CHOLHDL  Significant Diagnostic Results in last 30 days:  No results found.  Assessment/Plan  Status Post Quadriceps tendon repair  Patient doing well post op. Was seen by Ortho yesterday. He is doing therapy from today. Wants to eventually go to [facility] green Will d/c Oxymorphone as not using it. Continue aspirin BID  for 4 weeks and then once a day Hypotension His BP was slightly low today. Asymptomatic Check Vitals Q shift and follow Patient not on any Antihypertensive. Restless leg On Neurontin Constipation He is actually having loose stool will back off his Laxatives Insomnia Continue remeron   Family/ staff Communication:   Labs/tests ordered:  BMP and CBC in 1 week Total time spent in this patient care encounter was 45_ minutes; greater than 50% of the visit spent counseling patient, reviewing records , Labs and coordinating care for problems addressed at this encounter.

## 2017-12-30 ENCOUNTER — Other Ambulatory Visit: Payer: Self-pay

## 2017-12-30 ENCOUNTER — Emergency Department (HOSPITAL_COMMUNITY): Payer: Medicare Other

## 2017-12-30 ENCOUNTER — Encounter (HOSPITAL_COMMUNITY): Payer: Self-pay | Admitting: Emergency Medicine

## 2017-12-30 ENCOUNTER — Observation Stay (HOSPITAL_COMMUNITY)
Admission: EM | Admit: 2017-12-30 | Discharge: 2017-12-31 | Disposition: A | Discharge status: Home / Self Care | Payer: Medicare Other | Attending: Internal Medicine | Admitting: Internal Medicine

## 2017-12-30 DIAGNOSIS — Z905 Acquired absence of kidney: Secondary | ICD-10-CM | POA: Diagnosis not present

## 2017-12-30 DIAGNOSIS — Z7982 Long term (current) use of aspirin: Secondary | ICD-10-CM | POA: Insufficient documentation

## 2017-12-30 DIAGNOSIS — G2581 Restless legs syndrome: Secondary | ICD-10-CM | POA: Insufficient documentation

## 2017-12-30 DIAGNOSIS — I712 Thoracic aortic aneurysm, without rupture: Secondary | ICD-10-CM | POA: Diagnosis not present

## 2017-12-30 DIAGNOSIS — M419 Scoliosis, unspecified: Secondary | ICD-10-CM | POA: Diagnosis not present

## 2017-12-30 DIAGNOSIS — R079 Chest pain, unspecified: Secondary | ICD-10-CM | POA: Diagnosis present

## 2017-12-30 DIAGNOSIS — G894 Chronic pain syndrome: Secondary | ICD-10-CM | POA: Diagnosis not present

## 2017-12-30 DIAGNOSIS — G4700 Insomnia, unspecified: Secondary | ICD-10-CM | POA: Diagnosis not present

## 2017-12-30 DIAGNOSIS — Z8546 Personal history of malignant neoplasm of prostate: Secondary | ICD-10-CM

## 2017-12-30 DIAGNOSIS — I452 Bifascicular block: Secondary | ICD-10-CM | POA: Diagnosis not present

## 2017-12-30 DIAGNOSIS — Z91010 Allergy to peanuts: Secondary | ICD-10-CM | POA: Diagnosis not present

## 2017-12-30 DIAGNOSIS — Z9079 Acquired absence of other genital organ(s): Secondary | ICD-10-CM | POA: Insufficient documentation

## 2017-12-30 DIAGNOSIS — H5462 Unqualified visual loss, left eye, normal vision right eye: Secondary | ICD-10-CM | POA: Diagnosis not present

## 2017-12-30 DIAGNOSIS — R0600 Dyspnea, unspecified: Secondary | ICD-10-CM | POA: Diagnosis not present

## 2017-12-30 DIAGNOSIS — R0789 Other chest pain: Secondary | ICD-10-CM | POA: Diagnosis not present

## 2017-12-30 DIAGNOSIS — R0602 Shortness of breath: Secondary | ICD-10-CM

## 2017-12-30 DIAGNOSIS — Z9049 Acquired absence of other specified parts of digestive tract: Secondary | ICD-10-CM | POA: Diagnosis not present

## 2017-12-30 DIAGNOSIS — K5900 Constipation, unspecified: Secondary | ICD-10-CM | POA: Diagnosis not present

## 2017-12-30 DIAGNOSIS — Z79899 Other long term (current) drug therapy: Secondary | ICD-10-CM | POA: Insufficient documentation

## 2017-12-30 DIAGNOSIS — G8929 Other chronic pain: Secondary | ICD-10-CM | POA: Diagnosis present

## 2017-12-30 DIAGNOSIS — I517 Cardiomegaly: Secondary | ICD-10-CM | POA: Diagnosis not present

## 2017-12-30 DIAGNOSIS — G4733 Obstructive sleep apnea (adult) (pediatric): Secondary | ICD-10-CM | POA: Insufficient documentation

## 2017-12-30 DIAGNOSIS — R0609 Other forms of dyspnea: Secondary | ICD-10-CM | POA: Diagnosis not present

## 2017-12-30 DIAGNOSIS — Z91018 Allergy to other foods: Secondary | ICD-10-CM | POA: Insufficient documentation

## 2017-12-30 LAB — D-DIMER, QUANTITATIVE (NOT AT ARMC): D DIMER QUANT: 1.78 ug{FEU}/mL — AB (ref 0.00–0.50)

## 2017-12-30 LAB — BASIC METABOLIC PANEL
ANION GAP: 10 (ref 5–15)
BUN: 20 mg/dL (ref 6–20)
CO2: 24 mmol/L (ref 22–32)
Calcium: 9 mg/dL (ref 8.9–10.3)
Chloride: 104 mmol/L (ref 101–111)
Creatinine, Ser: 0.88 mg/dL (ref 0.61–1.24)
GFR calc Af Amer: >60 mL/min (ref >60)
GLUCOSE: 90 mg/dL (ref 65–99)
Potassium: 4.3 mmol/L (ref 3.5–5.1)
Sodium: 138 mmol/L (ref 135–145)

## 2017-12-30 LAB — CBC
HCT: 45.9 % (ref 39.0–52.0)
Hemoglobin: 15.1 g/dL (ref 13.0–17.0)
MCH: 30.1 pg (ref 26.0–34.0)
MCHC: 32.9 g/dL (ref 30.0–36.0)
MCV: 91.6 fL (ref 78.0–100.0)
Platelets: 151 10*3/uL (ref 150–400)
RBC: 5.01 MIL/uL (ref 4.22–5.81)
RDW: 13.7 % (ref 11.5–15.5)
WBC: 8.9 10*3/uL (ref 4.0–10.5)

## 2017-12-30 LAB — I-STAT TROPONIN, ED
Troponin i, poc: 0.01 ng/mL (ref 0.00–0.08)
Troponin i, poc: 0 ng/mL (ref 0.00–0.08)

## 2017-12-30 LAB — CBG MONITORING, ED: GLUCOSE-CAPILLARY: 84 mg/dL (ref 65–99)

## 2017-12-30 LAB — BRAIN NATRIURETIC PEPTIDE: B Natriuretic Peptide: 56.5 pg/mL (ref 0.0–100.0)

## 2017-12-30 LAB — TROPONIN I

## 2017-12-30 LAB — MAGNESIUM: Magnesium: 2.2 mg/dL (ref 1.7–2.4)

## 2017-12-30 MED ORDER — IOPAMIDOL (ISOVUE-370) INJECTION 76%
100.0000 mL | Freq: Once | INTRAVENOUS | Status: AC | PRN
  Administered 2017-12-30: 100 mL via INTRAVENOUS

## 2017-12-30 MED ORDER — GI COCKTAIL ~~LOC~~: 30.0000 mL | Freq: Four times a day (QID) | ORAL | Status: DC | PRN

## 2017-12-30 MED ORDER — ASPIRIN EC 325 MG PO TBEC
325.0000 mg | DELAYED_RELEASE_TABLET | Freq: Every day | ORAL | Status: DC
  Administered 2017-12-30 – 2017-12-31 (×2): 325 mg via ORAL
  Filled 2017-12-30 (×3): qty 1

## 2017-12-30 MED ORDER — ONDANSETRON HCL 4 MG/2ML IJ SOLN
4.0000 mg | Freq: Four times a day (QID) | INTRAMUSCULAR | Status: DC | PRN
Start: 2017-12-30 — End: 2017-12-31

## 2017-12-30 MED ORDER — ALBUTEROL SULFATE (2.5 MG/3ML) 0.083% IN NEBU
5.0000 mg | INHALATION_SOLUTION | Freq: Once | RESPIRATORY_TRACT | Status: DC
  Filled 2017-12-30: qty 6

## 2017-12-30 MED ORDER — GABAPENTIN 300 MG PO CAPS
300.0000 mg | ORAL_CAPSULE | Freq: Two times a day (BID) | ORAL | Status: DC
  Administered 2017-12-30 – 2017-12-31 (×2): 300 mg via ORAL
  Filled 2017-12-30 (×2): qty 1

## 2017-12-30 MED ORDER — OXYMORPHONE HCL 10 MG PO TABS: 10.0000 mg | ORAL_TABLET | Freq: Four times a day (QID) | ORAL | Status: DC | PRN

## 2017-12-30 MED ORDER — ACETAMINOPHEN 325 MG PO TABS
650.0000 mg | ORAL_TABLET | ORAL | Status: DC | PRN
  Administered 2017-12-31: 650 mg via ORAL
  Filled 2017-12-30: qty 2

## 2017-12-30 MED ORDER — ASPIRIN 81 MG PO CHEW
324.0000 mg | CHEWABLE_TABLET | Freq: Once | ORAL | Status: AC
  Administered 2017-12-30: 324 mg via ORAL
  Filled 2017-12-30: qty 4

## 2017-12-30 NOTE — ED Notes (Signed)
Ambulated pt in hall with walker. Pt ambulated with steady gait, O2 sat @ 92% RA with ambulation.

## 2017-12-30 NOTE — ED Provider Notes (Signed)
[facility] EMERGENCY DEPARTMENT Provider Note   CSN: [id number] Arrival date & time: 12/30/17  1215     History   Chief Complaint Chief Complaint  Patient presents with  . Shortness of Breath    HPI Casey Gates is a 84 y.o. male with history of restless leg syndrome, constipation, chronic pain syndrome on hydromorphone, prostate cancer s/p prostatectomy, OSA, here for evaluation of gradually worsening exertional shortness of breath for the last 1 month. States shortness of breath always happens with exertion when doing daily house chores that he was able to do before without any symptoms. Exertion also brings on left-sided chest pain described as "toothache" that lasts for a long time. Shortness of breath is worsened with exertion and alleviated with rest after a few minutes. Rest however does not improve the chest pain. Has tried hydromorphone but also does not alleviate the chest pain. States he just does not feel "right" with exertion. Denies fevers, chills, cough, URI symptoms, recent illness, nausea, vomiting, abdominal pain, dysuria, diarrhea. No history of hypertension, hyperlipidemia, diabetes, heart disease, tobacco abuse, DVT/PE. Had quadricep tendon surgical repair in August last 2018.  HPI  Past Medical History:  Diagnosis Date  . Asthma    AS A CHILD  . Blind left eye   . Cancer [facility])    PROSTATE CANCER  . Cellulitis    frequently, "all over"  . Chronic kidney disease   . Chronic pain syndrome   . Constipation   . DDD (degenerative disc disease), lumbar   . Insomnia   . Kyphoscoliosis   . Restless leg     Patient Active Problem List   Diagnosis Date Noted  . Rupture of left quadriceps muscle 07/07/2017  . Rupture of left quadriceps tendon 06/26/2017  . Eczema 06/26/2017  . Chest pain 07/29/2015  . OSA on CPAP 07/10/2015  . CPAP use counseling 07/10/2015  . Excessive daytime sleepiness 02/22/2015  . Sleep apnea 02/22/2015  . Snoring  02/22/2015  . Nocturia more than twice per night 02/22/2015  . Syncope 12/31/2012  . Bifascicular bundle branch block 12/31/2012    Class: Acute  . Cellulitis and abscess of leg, except foot 12/04/2012  . Chronic pain 12/04/2012  . Kyphosis 12/04/2012  . H/O unilateral nephrectomy 12/04/2012  . H/O prostate cancer 12/04/2012  . S/P TURP 12/04/2012    Past Surgical History:  Procedure Laterality Date  . CHOLECYSTECTOMY    . COLONOSCOPY    . PROSTATECTOMY    . QUADRICEPS TENDON REPAIR Left 07/07/2017   Procedure: REPAIR QUADRICEP TENDON;  Surgeon: Rod Can, MD;  Location: [place];  Service: Orthopedics;  Laterality: Left;  . RIGHT CATARACT EXTRACTION  2013  . RIGTH NEPHRECTOMY  2006  . TONSILLECTOMY    . TRANSURETHRAL RESECTION OF PROSTATE  1997       Home Medications    Prior to Admission medications   Medication Sig Start Date End Date Taking? Authorizing Provider  acetaminophen (TYLENOL) 325 MG tablet Take 650 mg by mouth every 6 (six) hours as needed for fever.   Yes [provider]  gabapentin (NEURONTIN) 300 MG capsule Take 300 mg by mouth 2 (two) times daily. (0900 & 2100)   Yes [provider]  oxymorphone (OPANA) 10 MG tablet Take 10 mg by mouth every 6 (six) hours as needed for pain.    Yes [provider]  aspirin EC 325 MG tablet Take 1 tablet (325 mg total) by mouth 2 (two) times  daily after a meal. Patient not taking: Reported on 12/30/2017 07/08/17   Rod Can, MD  docusate sodium (COLACE) 100 MG capsule Take 1 capsule (100 mg total) by mouth 2 (two) times daily. Patient not taking: Reported on 12/30/2017 07/08/17   Rod Can, MD  polyethylene glycol (MIRALAX / Floria Raveling) packet Take 17 g by mouth 2 (two) times daily. Patient not taking: Reported on 12/30/2017 08/28/16   [name], MD  senna (SENOKOT) 8.6 MG TABS tablet Take 1 tablet (8.6 mg total) by mouth 2 (two) times daily. Patient not taking: Reported on 12/30/2017  07/08/17   Rod Can, MD    Family History Family History  Problem Relation Age of Onset  . Bone cancer Mother   . Hypertension Mother   . Arthritis Mother     Social History Social History   Tobacco Use  . Smoking status: Never Smoker  . Smokeless tobacco: Never Used  Substance Use Topics  . Alcohol use: No    Alcohol/week: 0.0 oz    Comment: quit 1985, 2 fifths/ week,S/P rehab  . Drug use: No     Allergies   Peanuts [peanut oil]; Coconut flavor [flavoring agent]; and Coconut oil   Review of Systems Review of Systems  Respiratory: Positive for shortness of breath.   Cardiovascular: Positive for chest pain.  All other systems reviewed and are negative.    Physical Exam Updated Vital Signs BP (!) 152/68   Pulse 73   Temp 98.4 F (36.9 C) (Oral)   Resp 20   SpO2 97%   Physical Exam  Constitutional: He appears well-developed and well-nourished.  NAD.   HENT:  Head: Normocephalic and atraumatic.  Nose: Nose normal.  Moist mucous membranes. Tonsils and oropharynx normal  Eyes: Conjunctivae, EOM and lids are normal.  Neck: Trachea normal and normal range of motion.  Neck is supple Trachea midline No cervical adenopathy  Cardiovascular: Normal rate, regular rhythm, S1 normal, S2 normal and normal heart sounds.  Pulses:      Carotid pulses are 2+ on the right side, and 2+ on the left side.      Radial pulses are 2+ on the right side, and 2+ on the left side.       Dorsalis pedis pulses are 2+ on the right side, and 2+ on the left side.  RRR. No orthopnea. No LE edema or calf tenderness.   Pulmonary/Chest: Effort normal and breath sounds normal. No respiratory distress. He has no decreased breath sounds. He has no rhonchi.  No reproducible chest wall tenderness. CP not reproducible with AROM of upper extremities. No rales or wheezing.  Abdominal: Soft. Bowel sounds are normal. There is no tenderness.  No epigastric tenderness. No distention.     Neurological: He is alert. GCS eye subscore is 4. GCS verbal subscore is 5. GCS motor subscore is 6.  Skin: Skin is warm and dry. Capillary refill takes less than 2 seconds.  No rash to chest wall  Psychiatric: He has a normal mood and affect. His speech is normal and behavior is normal. Judgment and thought content normal. Cognition and memory are normal.     ED Treatments / Results  Labs (all labs ordered are listed, but only abnormal results are displayed) Labs Reviewed  D-DIMER, QUANTITATIVE (NOT AT [facility]) - Abnormal; Notable for the following components:      Result Value   D-Dimer, Quant 1.78 (*)    All other components within normal limits  BASIC METABOLIC PANEL  CBC  BRAIN NATRIURETIC PEPTIDE  MAGNESIUM  CBG MONITORING, ED  I-STAT TROPONIN, ED  I-STAT TROPONIN, ED    EKG  EKG Interpretation  Date/Time:  Tuesday December 30 2017 12:19:22 EST Ventricular Rate:  67 PR Interval:  170 QRS Duration: 132 QT Interval:  420 QTC Calculation: 443 R Axis:   -74 Text Interpretation:  Normal sinus rhythm Right bundle branch block Left anterior fascicular block ** Bifascicular block ** Left ventricular hypertrophy with QRS widening Abnormal ECG Confirmed by [name] [phone]) on 12/30/2017 4:59:45 PM       Radiology Dg Chest 2 View  Result Date: 12/30/2017 CLINICAL DATA:  Dyspnea EXAM: CHEST  2 VIEW COMPARISON:  04/14/2016 chest radiograph. FINDINGS: Stable cardiomediastinal silhouette with normal heart size. No pneumothorax. No pleural effusion. Mild eventration of the anterior right hemidiaphragm, stable. No pulmonary edema. Curvilinear opacities at both lung bases. No consolidative airspace disease. Cholecystectomy clips are seen in the right upper quadrant of the abdomen. IMPRESSION: Bibasilar curvilinear lung opacities, compatible with scarring or atelectasis. Electronically Signed   By: Ilona Sorrel M.D.   On: 12/30/2017 13:17   Ct Angio Chest Pe W And/or Wo  Contrast  Result Date: 12/30/2017 CLINICAL DATA:  Chest pain, positive D-dimer EXAM: CT ANGIOGRAPHY CHEST WITH CONTRAST TECHNIQUE: Multidetector CT imaging of the chest was performed using the standard protocol during bolus administration of intravenous contrast. Multiplanar CT image reconstructions and MIPs were obtained to evaluate the vascular anatomy. CONTRAST:  153mL ISOVUE-370 IOPAMIDOL (ISOVUE-370) INJECTION 76% COMPARISON:  Chest x-ray earlier today. FINDINGS: Cardiovascular: No filling defects in the pulmonary arteries to suggest pulmonary emboli. There is cardiomegaly. Dense diffuse coronary artery calcifications. Moderate aortic calcifications with tortuosity. Mild aneurysmal dilatation of the the proximal ascending thoracic aorta, 4.3 cm. Mediastinum/Nodes: No mediastinal, hilar, or axillary adenopathy. Lungs/Pleura: Areas of atelectasis in the lower lobes bilaterally at the lung bases. No effusions. Upper Abdomen: Imaging into the upper abdomen shows no acute findings. Prior cholecystectomy. Right kidney not visualized, question prior nephrectomy. Musculoskeletal: Chest wall soft tissues are unremarkable. No acute bony abnormality. Review of the MIP images confirms the above findings. IMPRESSION: No evidence of pulmonary embolus. Mild aneurysmal dilatation of the ascending thoracic aorta, 4.3 cm. Recommend annual imaging followup by CTA or MRA. This recommendation follows 2010 ACCF/AHA/AATS/ACR/ASA/SCA/SCAI/SIR/STS/SVM Guidelines for the Diagnosis and Management of Patients with Thoracic Aortic Disease. Circulation. 2010; 121: V956-L875 Cardiomegaly. Bibasilar atelectasis. Electronically Signed   By: Rolm Baptise M.D.   On: 12/30/2017 18:10    Procedures Procedures (including critical care time)  Medications Ordered in ED Medications  albuterol (PROVENTIL) (2.5 MG/3ML) 0.083% nebulizer solution 5 mg (5 mg Nebulization Not Given 12/30/17 1448)  aspirin chewable tablet 324 mg (324 mg Oral Given  12/30/17 1607)  iopamidol (ISOVUE-370) 76 % injection 100 mL (100 mLs Intravenous Contrast Given 12/30/17 1733)     Initial Impression / Assessment and Plan / ED Course  I have reviewed the triage vital signs and the nursing notes.  Pertinent labs & imaging results that were available during my care of the patient were reviewed by me and considered in my medical decision making (see chart for details).  Clinical Course as of Dec 30 1898  Tue Dec 30, 2017  1655 D-Dimer, Quant: (!) 1.78 [CG]  1705 IMPRESSION: Bibasilar curvilinear lung opacities, compatible with scarring or atelectasis. DG Chest 2 View [CG]  1705 Normal sinus rhythm Right bundle branch block Left anterior fascicular block Bifascicular block  Left ventricular hypertrophy with  QRS widening Abnormal ECG Confirmed by [name] [phone]) on 12/30/2017 4:59:45 PM ED EKG [CG]  1835 IMPRESSION: No evidence of pulmonary embolus.  Mild aneurysmal dilatation of the ascending thoracic aorta, 4.3 cm. Recommend annual imaging followup by CTA or MRA. This recommendation follows 2010 ACCF/AHA/AATS/ACR/ASA/SCA/SCAI/SIR/STS/SVM Guidelines for the Diagnosis and Management of Patients with Thoracic Aortic Disease. Circulation. 2010; 121: E423-N361  Cardiomegaly.  Bibasilar atelectasis.  CT Angio Chest PE W and/or Wo Contrast [CG]    Clinical Course User Index [CG] Kinnie Feil, PA-C   Concern for ACS, heart failure, pulmonary embolism, valvular regurgitation. Less likely pneumonia as he has no infectious symptoms. Patient is currently chest pain-free. We will attempt to ambulate with pulse ox symmetry, pending labs.  HEART score is 4. Seen by Dr Irish Lack (cardiology 2016). LV EF 75-80%, mild LVH, mild AI,  in echo 2016   1706: d-dimer elevated at 1.78, other lab work unremarkable. Chest x-ray shows bibasilar linear lung opacities possible from scarring or atelectasis. Will order CT angio to rule out PE. Final Clinical  Impressions(s) / ED Diagnoses   CT negative for PE. Given age, wrists, exertional symptoms, previous mild AI, LVH on EKG, heart score think patient will benefit from admission for chest pain. We'll consult hospitalist. Discussed plan with patient and son at bedside were agreeable. Final diagnoses:  Exertional shortness of breath    ED Discharge Orders    None       Arlean Hopping 12/30/17 1900    [name], MD 12/30/17 2332

## 2017-12-30 NOTE — ED Notes (Signed)
Pt to CT

## 2017-12-30 NOTE — H&P (Signed)
History and Physical    Teng Decou ZOX:[id number] DOB: [birth year] DOA: 12/30/2017  PCP: Velna Hatchet, MD  Patient coming from: Home  Chief Complaint: Dyspnea with exertion and exertional chest pain  HPI: Casey Gates is a 83 y.o. male with medical history significant of 1 month of exertional chest pain with shortness of breath that has been persisting but not escalating.  Patient reports that last 1-2 hours and is relieved usually with relaxation and rest.  He denies any cough.  Denies any fevers.  Denies any lower extremity edema or swelling.  He is never had a heart attack before.  He denies any cardiac workup in the past.  The pain is below his left breast and does not radiate anywhere.  It is not associated with nausea or vomiting.  Patient was walked in the hallway earlier and he did get short of breath but did not have any chest pain and his O2 sats were 92%.  Patient is being referred for chest pain.  Review of Systems: As per HPI otherwise 10 point review of systems negative.   Past Medical History:  Diagnosis Date  . Asthma    AS A CHILD  . Blind left eye   . Cancer [facility])    PROSTATE CANCER  . Cellulitis    frequently, "all over"  . Chronic kidney disease   . Chronic pain syndrome   . Constipation   . DDD (degenerative disc disease), lumbar   . Insomnia   . Kyphoscoliosis   . Restless leg     Past Surgical History:  Procedure Laterality Date  . CHOLECYSTECTOMY    . COLONOSCOPY    . PROSTATECTOMY    . QUADRICEPS TENDON REPAIR Left 07/07/2017   Procedure: REPAIR QUADRICEP TENDON;  Surgeon: Rod Can, MD;  Location: [place];  Service: Orthopedics;  Laterality: Left;  . RIGHT CATARACT EXTRACTION  2013  . RIGTH NEPHRECTOMY  2006  . TONSILLECTOMY    . TRANSURETHRAL RESECTION OF PROSTATE  1997     reports that  has never smoked. he has never used smokeless tobacco. He reports that he does not drink alcohol or use drugs.  Allergies  Allergen Reactions  .  Peanuts [Peanut Oil] Shortness Of Breath    Shortness of breath---all nuts, but can have peanuts, cashews, and almonds  . Coconut Flavor [Flavoring Agent]     Anything that is related to coconut  . Coconut Oil Other (See Comments)    Unknown childhood reaction.      Family History  Problem Relation Age of Onset  . Bone cancer Mother   . Hypertension Mother   . Arthritis Mother     Prior to Admission medications   Medication Sig Start Date End Date Taking? Authorizing Provider  acetaminophen (TYLENOL) 325 MG tablet Take 650 mg by mouth every 6 (six) hours as needed for fever.   Yes [provider]  gabapentin (NEURONTIN) 300 MG capsule Take 300 mg by mouth 2 (two) times daily. (0900 & 2100)   Yes [provider]  oxymorphone (OPANA) 10 MG tablet Take 10 mg by mouth every 6 (six) hours as needed for pain.    Yes [provider]  aspirin EC 325 MG tablet Take 1 tablet (325 mg total) by mouth 2 (two) times daily after a meal. Patient not taking: Reported on 12/30/2017 07/08/17   Rod Can, MD  docusate sodium (COLACE) 100 MG capsule Take 1 capsule (100 mg total) by mouth 2 (two) times  daily. Patient not taking: Reported on 12/30/2017 07/08/17   Rod Can, MD  polyethylene glycol (MIRALAX / Floria Raveling) packet Take 17 g by mouth 2 (two) times daily. Patient not taking: Reported on 12/30/2017 08/28/16   [name], MD  senna (SENOKOT) 8.6 MG TABS tablet Take 1 tablet (8.6 mg total) by mouth 2 (two) times daily. Patient not taking: Reported on 12/30/2017 07/08/17   Rod Can, MD    Physical Exam: Vitals:   12/30/17 1630 12/30/17 1908 12/30/17 1911 12/30/17 1930  BP: (!) 152/68  132/73 128/71  Pulse: 73 82 82 84  Resp: 20 14 17 15   Temp:      TempSrc:      SpO2: 97% 96% 94% 95%      Constitutional: NAD, calm, comfortable Vitals:   12/30/17 1630 12/30/17 1908 12/30/17 1911 12/30/17 1930  BP: (!) 152/68  132/73 128/71  Pulse: 73 82 82 84    Resp: 20 14 17 15   Temp:      TempSrc:      SpO2: 97% 96% 94% 95%   Eyes: PERRL, lids and conjunctivae normal ENMT: Mucous membranes are moist. Posterior pharynx clear of any exudate or lesions.Normal dentition.  Neck: normal, supple, no masses, no thyromegaly Respiratory: clear to auscultation bilaterally, no wheezing, no crackles. Normal respiratory effort. No accessory muscle use.  Cardiovascular: Regular rate and rhythm, no murmurs / rubs / gallops. No extremity edema. 2+ pedal pulses. No carotid bruits.  Abdomen: no tenderness, no masses palpated. No hepatosplenomegaly. Bowel sounds positive.  Musculoskeletal: no clubbing / cyanosis. No joint deformity upper and lower extremities. Good ROM, no contractures. Normal muscle tone.  Skin: no rashes, lesions, ulcers. No induration Neurologic: CN 2-12 grossly intact. Sensation intact, DTR normal. Strength 5/5 in all 4.  Psychiatric: Normal judgment and insight. Alert and oriented x 3. Normal mood.    Labs on Admission: I have personally reviewed following labs and imaging studies  CBC: Recent Labs  Lab 12/30/17 1558  WBC 8.9  HGB 15.1  HCT 45.9  MCV 91.6  PLT 937   Basic Metabolic Panel: Recent Labs  Lab 12/30/17 1558  NA 138  K 4.3  CL 104  CO2 24  GLUCOSE 90  BUN 20  CREATININE 0.88  CALCIUM 9.0  MG 2.2   GFR: CrCl cannot be calculated (Unknown ideal weight.). Liver Function Tests: No results for input(s): AST, ALT, ALKPHOS, BILITOT, PROT, ALBUMIN in the last 168 hours. No results for input(s): LIPASE, AMYLASE in the last 168 hours. No results for input(s): AMMONIA in the last 168 hours. Coagulation Profile: No results for input(s): INR, PROTIME in the last 168 hours. Cardiac Enzymes: No results for input(s): CKTOTAL, CKMB, CKMBINDEX, TROPONINI in the last 168 hours. BNP (last 3 results) No results for input(s): PROBNP in the last 8760 hours. HbA1C: No results for input(s): HGBA1C in the last 72  hours. CBG: Recent Labs  Lab 12/30/17 1611  GLUCAP 84   Lipid Profile: No results for input(s): CHOL, HDL, LDLCALC, TRIG, CHOLHDL, LDLDIRECT in the last 72 hours. Thyroid Function Tests: No results for input(s): TSH, T4TOTAL, FREET4, T3FREE, THYROIDAB in the last 72 hours. Anemia Panel: No results for input(s): VITAMINB12, FOLATE, FERRITIN, TIBC, IRON, RETICCTPCT in the last 72 hours. Urine analysis:    Component Value Date/Time   COLORURINE YELLOW 07/18/2015 2220   APPEARANCEUR CLOUDY (A) 07/18/2015 2220   LABSPEC 1.015 07/18/2015 2220   PHURINE 7.0 07/18/2015 2220   GLUCOSEU NEGATIVE 07/18/2015 2220  HGBUR NEGATIVE 07/18/2015 2220   BILIRUBINUR NEGATIVE 07/18/2015 2220   KETONESUR NEGATIVE 07/18/2015 2220   PROTEINUR NEGATIVE 07/18/2015 2220   UROBILINOGEN 0.2 07/18/2015 2220   NITRITE NEGATIVE 07/18/2015 2220   LEUKOCYTESUR NEGATIVE 07/18/2015 2220   Sepsis Labs: !!!!!!!!!!!!!!!!!!!!!!!!!!!!!!!!!!!!!!!!!!!! @LABRCNTIP (procalcitonin:4,lacticidven:4) )No results found for this or any previous visit (from the past 240 hour(s)).   Radiological Exams on Admission: Dg Chest 2 View  Result Date: 12/30/2017 CLINICAL DATA:  Dyspnea EXAM: CHEST  2 VIEW COMPARISON:  04/14/2016 chest radiograph. FINDINGS: Stable cardiomediastinal silhouette with normal heart size. No pneumothorax. No pleural effusion. Mild eventration of the anterior right hemidiaphragm, stable. No pulmonary edema. Curvilinear opacities at both lung bases. No consolidative airspace disease. Cholecystectomy clips are seen in the right upper quadrant of the abdomen. IMPRESSION: Bibasilar curvilinear lung opacities, compatible with scarring or atelectasis. Electronically Signed   By: Ilona Sorrel M.D.   On: 12/30/2017 13:17   Ct Angio Chest Pe W And/or Wo Contrast  Result Date: 12/30/2017 CLINICAL DATA:  Chest pain, positive D-dimer EXAM: CT ANGIOGRAPHY CHEST WITH CONTRAST TECHNIQUE: Multidetector CT imaging of the chest  was performed using the standard protocol during bolus administration of intravenous contrast. Multiplanar CT image reconstructions and MIPs were obtained to evaluate the vascular anatomy. CONTRAST:  175mL ISOVUE-370 IOPAMIDOL (ISOVUE-370) INJECTION 76% COMPARISON:  Chest x-ray earlier today. FINDINGS: Cardiovascular: No filling defects in the pulmonary arteries to suggest pulmonary emboli. There is cardiomegaly. Dense diffuse coronary artery calcifications. Moderate aortic calcifications with tortuosity. Mild aneurysmal dilatation of the the proximal ascending thoracic aorta, 4.3 cm. Mediastinum/Nodes: No mediastinal, hilar, or axillary adenopathy. Lungs/Pleura: Areas of atelectasis in the lower lobes bilaterally at the lung bases. No effusions. Upper Abdomen: Imaging into the upper abdomen shows no acute findings. Prior cholecystectomy. Right kidney not visualized, question prior nephrectomy. Musculoskeletal: Chest wall soft tissues are unremarkable. No acute bony abnormality. Review of the MIP images confirms the above findings. IMPRESSION: No evidence of pulmonary embolus. Mild aneurysmal dilatation of the ascending thoracic aorta, 4.3 cm. Recommend annual imaging followup by CTA or MRA. This recommendation follows 2010 ACCF/AHA/AATS/ACR/ASA/SCA/SCAI/SIR/STS/SVM Guidelines for the Diagnosis and Management of Patients with Thoracic Aortic Disease. Circulation. 2010; 121: D428-J681 Cardiomegaly. Bibasilar atelectasis. Electronically Signed   By: Rolm Baptise M.D.   On: 12/30/2017 18:10    EKG: Independently reviewed.  Bifascicular block which is old  Case discussed with EDP Old chart reviewed  Assessment/Plan 84 year old male with dyspnea on exertion and exertional chest pain Principal Problem:   Chest pain-serial cardiac enzymes.  Obtain cardiac echo in the morning.  Complete evaluation with outpatient stress testing if everything negative here.  Aspirin.  Active Problems:   Chronic pain-continue  home medications   H/O prostate cancer-noted   Dyspnea on exertion-CTA is negative for PE.  Chest x-ray no edema.  No evidence of heart failure.  Obtain cardiac echo.     DVT prophylaxis: SCDs Code Status: Full Family Communication: Son Disposition Plan: Per day team Consults called: None Admission status: Observation   Dell Briner A MD [facility]  If 7PM-7AM, please contact night-coverage [email or web address] Password [facility]  12/30/2017, 7:43 PM

## 2017-12-30 NOTE — ED Notes (Signed)
EDP states that pt can eat and take home pain medication that he has with him.

## 2017-12-30 NOTE — ED Triage Notes (Signed)
Patient brought in by family for chest pain, fatigue, and shortness of breath for the last "couple of weeks".  Patient states he has had a little shortness of breath this morning but denies any complaints at this time. Alert and oriented and in no apparent distress at this time.

## 2017-12-30 NOTE — ED Notes (Addendum)
This writer witnessed pt take oxymorphone 10 mg, 1 tablet and confirmed pt name on bottle. Pt had 3 in bottle and took one.

## 2017-12-31 ENCOUNTER — Other Ambulatory Visit (HOSPITAL_COMMUNITY): Payer: Medicare Other

## 2017-12-31 ENCOUNTER — Observation Stay (HOSPITAL_BASED_OUTPATIENT_CLINIC_OR_DEPARTMENT_OTHER): Payer: Medicare Other

## 2017-12-31 DIAGNOSIS — R0602 Shortness of breath: Secondary | ICD-10-CM | POA: Diagnosis not present

## 2017-12-31 DIAGNOSIS — G8929 Other chronic pain: Secondary | ICD-10-CM | POA: Diagnosis not present

## 2017-12-31 DIAGNOSIS — R072 Precordial pain: Secondary | ICD-10-CM

## 2017-12-31 DIAGNOSIS — R079 Chest pain, unspecified: Secondary | ICD-10-CM

## 2017-12-31 DIAGNOSIS — Z8546 Personal history of malignant neoplasm of prostate: Secondary | ICD-10-CM

## 2017-12-31 LAB — CBC WITH DIFFERENTIAL/PLATELET
BASOS ABS: 0 10*3/uL (ref 0.0–0.1)
BASOS PCT: 0 %
EOS PCT: 5 %
Eosinophils Absolute: 0.3 10*3/uL (ref 0.0–0.7)
HCT: 45.4 % (ref 39.0–52.0)
Hemoglobin: 14.9 g/dL (ref 13.0–17.0)
Lymphocytes Relative: 32 %
Lymphs Abs: 2.2 10*3/uL (ref 0.7–4.0)
MCH: 29.7 pg (ref 26.0–34.0)
MCHC: 32.8 g/dL (ref 30.0–36.0)
MCV: 90.6 fL (ref 78.0–100.0)
MONO ABS: 0.7 10*3/uL (ref 0.1–1.0)
MONOS PCT: 10 %
Neutro Abs: 3.7 10*3/uL (ref 1.7–7.7)
Neutrophils Relative %: 53 %
PLATELETS: 163 10*3/uL (ref 150–400)
RBC: 5.01 MIL/uL (ref 4.22–5.81)
RDW: 13.8 % (ref 11.5–15.5)
WBC: 7 10*3/uL (ref 4.0–10.5)

## 2017-12-31 LAB — MAGNESIUM: Magnesium: 2.2 mg/dL (ref 1.7–2.4)

## 2017-12-31 LAB — COMPREHENSIVE METABOLIC PANEL
ALT: 17 U/L (ref 17–63)
ANION GAP: 9 (ref 5–15)
AST: 29 U/L (ref 15–41)
Albumin: 3.5 g/dL (ref 3.5–5.0)
Alkaline Phosphatase: 73 U/L (ref 38–126)
BUN: 16 mg/dL (ref 6–20)
CHLORIDE: 106 mmol/L (ref 101–111)
CO2: 23 mmol/L (ref 22–32)
Calcium: 8.9 mg/dL (ref 8.9–10.3)
Creatinine, Ser: 0.81 mg/dL (ref 0.61–1.24)
GFR calc non Af Amer: >60 mL/min (ref >60)
Glucose, Bld: 90 mg/dL (ref 65–99)
POTASSIUM: 4.4 mmol/L (ref 3.5–5.1)
Sodium: 138 mmol/L (ref 135–145)
TOTAL PROTEIN: 6.1 g/dL — AB (ref 6.5–8.1)
Total Bilirubin: 0.9 mg/dL (ref 0.3–1.2)

## 2017-12-31 LAB — TROPONIN I: Troponin I: <0.03 ng/mL (ref <0.03)

## 2017-12-31 LAB — PHOSPHORUS: PHOSPHORUS: 2.9 mg/dL (ref 2.5–4.6)

## 2017-12-31 LAB — ECHOCARDIOGRAM COMPLETE
HEIGHTINCHES: 72 in
WEIGHTICAEL: 2377.44 [oz_av]

## 2017-12-31 LAB — MRSA PCR SCREENING: MRSA BY PCR: NEGATIVE

## 2017-12-31 MED ORDER — SENNOSIDES-DOCUSATE SODIUM 8.6-50 MG PO TABS
1.0000 | ORAL_TABLET | Freq: Two times a day (BID) | ORAL | Status: DC
  Administered 2017-12-31: 1 via ORAL
  Filled 2017-12-31: qty 1

## 2017-12-31 MED ORDER — ASPIRIN 325 MG PO TBEC: 325.0000 mg | DELAYED_RELEASE_TABLET | Freq: Every day | ORAL | 0 refills | Status: DC

## 2017-12-31 MED ORDER — POLYETHYLENE GLYCOL 3350 17 G PO PACK
17.0000 g | PACK | Freq: Two times a day (BID) | ORAL | Status: DC
  Administered 2017-12-31: 17 g via ORAL
  Filled 2017-12-31: qty 1

## 2017-12-31 NOTE — Progress Notes (Addendum)
CSW called pt's son. CSW had to leave voice message to son inquiring about pt's transportation back to the [facility]F.   CSW informed pt's nurse about leaving voice mail for pt's son about transportation.  CSW spoke with pt's son. Son will pick up pt around 6. Son will call pt's room to inform him when he gets here. Son plans to come to the [facility] to pick him up.   Wendelyn Breslow, [facility]  [phone]

## 2017-12-31 NOTE — Progress Notes (Addendum)
Pt is alert and oriented with no distress, ECHO completed and Miralax, senna  Given for patient complaining of hard stool.

## 2017-12-31 NOTE — Clinical Social Work Note (Signed)
Clinical Social Work Assessment  Patient Details  Name: Casey Gates MRN: [id number] Date of Birth: 26-Jan-1926  Date of referral:  12/31/17               Reason for consult:  Discharge Planning                Permission sought to share information with:  Chartered certified accountant granted to share information::  Yes, Verbal Permission Granted  Name::        Agency::  [facility]  Relationship::     Contact Information:     Housing/Transportation Living arrangements for the past 2 months:  [place] of Information:  Patient, Medical Team Patient Interpreter Needed:  None Criminal Activity/Legal Involvement Pertinent to Current Situation/Hospitalization:  No - Comment as needed Significant Relationships:  Adult Children Lives with:  Facility Resident Do you feel safe going back to the place where you live?  Yes Need for family participation in patient care:  Yes (Comment)  Care giving concerns:  Patient is a resident at [facility].   Social Worker assessment / plan:  CSW met with patient. No supports at bedside. CSW introduced role and explained that discharge planning would be discussed. Patient confirmed he is from [place] and plans to return at discharge. Per MD, waiting on cardiology to clear before discharge. Patient states family will transport him back to the facility. No further concerns. CSW encouraged patient to contact CSW as needed. CSW will continue to follow patient for support and facilitate discharge to SNF once medically stable.  Employment status:  Retired Nurse, adult PT Recommendations:  Not assessed at this time Information / Referral to community resources:  Other (Comment Required)(Plan is to return to ALF.)  Patient/Family's Response to care:  Patient agreeable to return to ALF. Patient's son supportive and involved in patient's care. Patient appreciated social work  intervention.  Patient/Family's Understanding of and Emotional Response to Diagnosis, Current Treatment, and Prognosis:  Patient has a good understanding of the reason for admission and plan to return to ALF once medically stable for discharge. Patient appears happy with hospital care.  Emotional Assessment Appearance:  Appears stated age Attitude/Demeanor/Rapport:  Engaged, Gracious Affect (typically observed):  Accepting, Appropriate, Calm, Pleasant Orientation:  Oriented to Self, Oriented to Place, Oriented to  Time, Oriented to Situation Alcohol / Substance use:  Never Used Psych involvement (Current and /or in the community):  No (Comment)  Discharge Needs  Concerns to be addressed:  Care Coordination Readmission within the last 30 days:  No Current discharge risk:  None Barriers to Discharge:  Continued Medical Work up   Candie Chroman, LCSW 12/31/2017, 2:19 PM

## 2017-12-31 NOTE — Progress Notes (Signed)
Echocardiogram 2D Echocardiogram has been performed.  Casey Gates 12/31/2017, 2:48 PM

## 2017-12-31 NOTE — Care Management Obs Status (Signed)
[facility] NOTIFICATION   Patient Details  Name: Casey Gates MRN: [id number] Date of Birth: 21-Aug-1926   Medicare Observation Status Notification Given:  Yes    Carles Collet, RN 12/31/2017, 9:51 AM

## 2017-12-31 NOTE — Consult Note (Signed)
Cardiology Consultation:   Patient ID: Casey Gates; [id number]; 03/18/1926   Admit date: 12/30/2017 Date of Consult: 12/31/2017  Primary Care Provider: Velna Hatchet, MD Primary Cardiologist: Dr. Irish Lack (last office visit 08/18/2015) Primary Electrophysiologist:  N/A   Patient Profile:   Casey Gates is a 84 y.o. male with a hx of degenerative disk disease, kyphoscoliosis, chronic pain syndrome on hydromorphone, and insomnia but no cardiac history who is being seen today for the evaluation of chest pain and shortness of breath at the request of Dr. Alfredia Ferguson.  History of Present Illness:   Casey Gates presented to the ED yesterday for worsening DOE and exertional chest pain for the past month. EKG showed NSR with no acute ST changes. Troponins were negative x3. D-dimer was elevated at 1.78 but CTA was negative for PE. CXR showed bibasilar curvilinear lung opacities, compatible with scarring or atelectasis.  He reports shortness of breath with minimal activity such as walking around his apartment.This started as an isolated event about 4-6 weeks ago but now occurs 3-4 times a week. More recently he has noticed that he gets short of breath when walking from his apartment to his car The shortness of breath is worse in the evenings. He states the shortness of breath will quickly resolve with rest/relaxation. He denies any orthopnea, PND, or lower extremity edema.   He has also had intermittent left sided chest pain for the past 4-6 weeks. He ranks the pain as a 3/10 at most. He notes onset of chest pain with exertion that last several hours and gradually resolves on its own with rest. Initially, he had this chest pain 2-3 times per week but now he is having this pain almost daily. No association with position or meals.   He denies palpitations, lightheadedness, and dizziness.   He has not been sick recently. He does state he has not been sleeping well and has barely slept in the past week. He does  have a history of insomnia.   Past Medical History:  Diagnosis Date  . Asthma    AS A CHILD  . Blind left eye   . Cancer [facility])    PROSTATE CANCER  . Cellulitis    frequently, "all over"  . Chronic kidney disease   . Chronic pain syndrome   . Constipation   . DDD (degenerative disc disease), lumbar   . Insomnia   . Kyphoscoliosis   . Restless leg     Past Surgical History:  Procedure Laterality Date  . CHOLECYSTECTOMY    . COLONOSCOPY    . PROSTATECTOMY    . QUADRICEPS TENDON REPAIR Left 07/07/2017   Procedure: REPAIR QUADRICEP TENDON;  Surgeon: Rod Can, MD;  Location: [facility];  Service: Orthopedics;  Laterality: Left;  . RIGHT CATARACT EXTRACTION  2013  . RIGTH NEPHRECTOMY  2006  . TONSILLECTOMY    . TRANSURETHRAL RESECTION OF PROSTATE  1997     Prior to Admission medications   Medication Sig Start Date End Date Taking? Authorizing Provider  acetaminophen (TYLENOL) 325 MG tablet Take 650 mg by mouth every 6 (six) hours as needed for fever.   Yes [provider]  gabapentin (NEURONTIN) 300 MG capsule Take 300 mg by mouth 2 (two) times daily. (0900 & 2100)   Yes [provider]  oxymorphone (OPANA) 10 MG tablet Take 10 mg by mouth every 6 (six) hours as needed for pain.    Yes [provider]  aspirin EC 325 MG tablet Take 1 tablet (  325 mg total) by mouth 2 (two) times daily after a meal. Patient not taking: Reported on 12/30/2017 07/08/17   Rod Can, MD  docusate sodium (COLACE) 100 MG capsule Take 1 capsule (100 mg total) by mouth 2 (two) times daily. Patient not taking: Reported on 12/30/2017 07/08/17   Rod Can, MD  polyethylene glycol (MIRALAX / Floria Raveling) packet Take 17 g by mouth 2 (two) times daily. Patient not taking: Reported on 12/30/2017 08/28/16   [name], MD  senna (SENOKOT) 8.6 MG TABS tablet Take 1 tablet (8.6 mg total) by mouth 2 (two) times daily. Patient not taking: Reported on 12/30/2017 07/08/17   Rod Can, MD    Inpatient Medications: Scheduled Meds: . aspirin EC  325 mg Oral Daily  . gabapentin  300 mg Oral BID  . polyethylene glycol  17 g Oral BID  . senna-docusate  1 tablet Oral BID   Continuous Infusions:  PRN Meds: acetaminophen, gi cocktail, ondansetron (ZOFRAN) IV, oxymorphone  Allergies:    Allergies  Allergen Reactions  . Peanuts [Peanut Oil] Shortness Of Breath    Shortness of breath---all nuts, but can have peanuts, cashews, and almonds  . Coconut Flavor [Flavoring Agent]     Anything that is related to coconut  . Coconut Oil Other (See Comments)    Unknown childhood reaction.      Social History:   Social History   Socioeconomic History  . Marital status: Widowed    Spouse name: Not on file  . Number of children: Not on file  . Years of education: Not on file  . Highest education level: Not on file  Social Needs  . Financial resource strain: Not on file  . Food insecurity - worry: Not on file  . Food insecurity - inability: Not on file  . Transportation needs - medical: Not on file  . Transportation needs - non-medical: Not on file  Occupational History  . Not on file  Tobacco Use  . Smoking status: Never Smoker  . Smokeless tobacco: Never Used  Substance and Sexual Activity  . Alcohol use: No    Alcohol/week: 0.0 oz    Comment: quit 1985, 2 fifths/ week,S/P rehab  . Drug use: No  . Sexual activity: No  Other Topics Concern  . Not on file  Social History Narrative   Caffeine 2-3 cups average.  Widowed,  Lives at [facility], [place]. Living.  Retired Forensic psychologist.  One son.    Family History:   Family History  Problem Relation Age of Onset  . Bone cancer Mother   . Hypertension Mother   . Arthritis Mother    Family Status:  Family Status  Relation Name Status  . Mother  Deceased  . Father  Deceased  . MGM  Deceased  . MGF  Deceased  . PGM  Deceased  . PGF  Deceased    ROS:  Please see the history of present illness.  All  other ROS reviewed and negative.     Physical Exam/Data:   Vitals:   12/30/17 2148 12/30/17 2337 12/31/17 0505 12/31/17 0900  BP: 135/77 135/77 132/76 (!) 121/57  Pulse: 81 81 88 (!) 57  Resp: 18 18 18 18   Temp: 98.2 F (36.8 C) 98.2 F (36.8 C) 98.4 F (36.9 C) (!) 97.5 F (36.4 C)  TempSrc: Oral Oral Oral Oral  SpO2: 96% 98% 98% 95%  Weight: 149 lb 9.6 oz (67.9 kg) 149 lb 9.6 oz (67.9 kg) 148 lb 9.4  oz (67.4 kg)   Height:  6' (1.829 m)      Intake/Output Summary (Last 24 hours) at 12/31/2017 1206 Last data filed at 12/31/2017 0900 Gross per 24 hour  Intake 120 ml  Output 1100 ml  Net -980 ml   Filed Weights   12/30/17 2148 12/30/17 2337 12/31/17 0505  Weight: 149 lb 9.6 oz (67.9 kg) 149 lb 9.6 oz (67.9 kg) 148 lb 9.4 oz (67.4 kg)   Body mass index is 20.15 kg/m.  General:  84 y.o. Caucasian thin male lying comfortably in bed in no acute distress; appears frail and drowsy HEENT: normal Lymph: no adenopathy Neck: no JVD appreciated  Endocrine:  No thryomegaly Vascular: No carotid bruits; upper extremity pulses 2+, lower extremity pulses 1+ Cardiac:  normal S1, S2; RRR; 2/6 systolic murmur  Lungs:  No increased work of breathing, few rales appreciated at bilateral bases (R>L), no wheezing or rhonchi noted Abd: soft, nontender, no hepatomegaly  Ext: no edema Musculoskeletal:  No deformities, BUE and BLE strength normal and equal Skin: warm and dry  Neuro:  CNs 2-12 intact, no focal abnormalities noted Psych:  Normal affect   EKG:  The EKG was personally reviewed and demonstrates:  NSR with rate of 67, RBBB, no acute ST changes, no significant changes from prior EKG Telemetry:  Telemetry was personally reviewed and demonstrates:  NSR  Relevant CV Studies:  ECHO 07/30/2015: Study Conclusions - Left ventricle: The cavity size was normal. There was mild   concentric hypertrophy. Systolic function was vigorous. The   estimated ejection fraction was in the range of 75%  to 80%. Wall   motion was normal; there were no regional wall motion   abnormalities. Doppler parameters are consistent with abnormal   left ventricular relaxation (grade 1 diastolic dysfunction). - Aortic valve: There was mild regurgitation.  Laboratory Data:  Chemistry Recent Labs  Lab 12/30/17 1558 12/31/17 0802  NA 138 138  K 4.3 4.4  CL 104 106  CO2 24 23  GLUCOSE 90 90  BUN 20 16  CREATININE 0.88 0.81  CALCIUM 9.0 8.9  GFRNONAA >60 >60  GFRAA >60 >60  ANIONGAP 10 9    Lab Results  Component Value Date   ALT 17 12/31/2017   AST 29 12/31/2017   ALKPHOS 73 12/31/2017   BILITOT 0.9 12/31/2017   Hematology Recent Labs  Lab 12/30/17 1558 12/31/17 0802  WBC 8.9 7.0  RBC 5.01 5.01  HGB 15.1 14.9  HCT 45.9 45.4  MCV 91.6 90.6  MCH 30.1 29.7  MCHC 32.9 32.8  RDW 13.7 13.8  PLT 151 163   Cardiac Enzymes Recent Labs  Lab 12/30/17 2024 12/30/17 2309 12/31/17 0303  TROPONINI <0.03 <0.03 <0.03    Recent Labs  Lab 12/30/17 1610 12/30/17 1903  TROPIPOC 0.01 0.00    BNP Recent Labs  Lab 12/30/17 1531  BNP 56.5    DDimer  Recent Labs  Lab 12/30/17 1558  DDIMER 1.78*   TSH:  Lab Results  Component Value Date   TSH 1.468 12/31/2012   Lipids:No results found for: CHOL, HDL, LDLCALC, LDLDIRECT, TRIG, CHOLHDL HgbA1c:No results found for: HGBA1C Magnesium:  Magnesium  Date Value Ref Range Status  12/31/2017 2.2 1.7 - 2.4 mg/dL Final     Radiology/Studies:  Dg Chest 2 View  Result Date: 12/30/2017 CLINICAL DATA:  Dyspnea EXAM: CHEST  2 VIEW COMPARISON:  04/14/2016 chest radiograph. FINDINGS: Stable cardiomediastinal silhouette with normal heart size. No pneumothorax. No pleural effusion.  Mild eventration of the anterior right hemidiaphragm, stable. No pulmonary edema. Curvilinear opacities at both lung bases. No consolidative airspace disease. Cholecystectomy clips are seen in the right upper quadrant of the abdomen. IMPRESSION: Bibasilar  curvilinear lung opacities, compatible with scarring or atelectasis. Electronically Signed   By: Ilona Sorrel M.D.   On: 12/30/2017 13:17   Ct Angio Chest Pe W And/or Wo Contrast  Result Date: 12/30/2017 CLINICAL DATA:  Chest pain, positive D-dimer EXAM: CT ANGIOGRAPHY CHEST WITH CONTRAST TECHNIQUE: Multidetector CT imaging of the chest was performed using the standard protocol during bolus administration of intravenous contrast. Multiplanar CT image reconstructions and MIPs were obtained to evaluate the vascular anatomy. CONTRAST:  131mL ISOVUE-370 IOPAMIDOL (ISOVUE-370) INJECTION 76% COMPARISON:  Chest x-ray earlier today. FINDINGS: Cardiovascular: No filling defects in the pulmonary arteries to suggest pulmonary emboli. There is cardiomegaly. Dense diffuse coronary artery calcifications. Moderate aortic calcifications with tortuosity. Mild aneurysmal dilatation of the the proximal ascending thoracic aorta, 4.3 cm. Mediastinum/Nodes: No mediastinal, hilar, or axillary adenopathy. Lungs/Pleura: Areas of atelectasis in the lower lobes bilaterally at the lung bases. No effusions. Upper Abdomen: Imaging into the upper abdomen shows no acute findings. Prior cholecystectomy. Right kidney not visualized, question prior nephrectomy. Musculoskeletal: Chest wall soft tissues are unremarkable. No acute bony abnormality. Review of the MIP images confirms the above findings. IMPRESSION: No evidence of pulmonary embolus. Mild aneurysmal dilatation of the ascending thoracic aorta, 4.3 cm. Recommend annual imaging followup by CTA or MRA. This recommendation follows 2010 ACCF/AHA/AATS/ACR/ASA/SCA/SCAI/SIR/STS/SVM Guidelines for the Diagnosis and Management of Patients with Thoracic Aortic Disease. Circulation. 2010; 121: Z169-C789 Cardiomegaly. Bibasilar atelectasis. Electronically Signed   By: Rolm Baptise M.D.   On: 12/30/2017 18:10    Assessment and Plan:   Principal Problem: 1. Chest pain, Atypical - Patient  complaining of intermittent exertional chest pain that takes several hours to resolve with rest for the past 4-6 weeks. No history of HTN, HLD, DM, or CAD. He has never had a heart cath or stress test. - EKG showed NSR with RBBB (old). Troponin negative x3.  - Patient does not currently have any chest pain.  - Echo has been ordered but has not been performed yet. - If Echo negative, can consider stress test as outpatient.  Active Problems:  2. Chronic pain - Per IM    3. Exertional shortness of breath - D-dimer elevated at 1.78 - CTA negative for PE. - Waiting on Echo. - Patient ambulated in the halls. He did get short of breath with this but did not have any chest pain and his O2 sats were 92%.   For questions or updates, please contact [facility] Please consult [email or web address] for contact info under Cardiology/STEMI.   Signed, TYSHON FANNING, Student-PA  12/31/2017 12:06 PM   I have personally seen and examined this patient. I agree with the assessment and plan as outlined above.  Mr. Ardito has no known CAD. He is known to have normal LV systolic function by echo in 2016. He has minimal valve disease by echo in 2016.  He has had chest pain on/off for years. He is now admitted with c/o left sided chest "aching" at times. He really does not think this is worsened with exertion. He has some dyspnea but this is not exertional. He is a poor historian.  Troponin is negative x 3. EKG shows sinus with RBBB, LAFB.  My exam:  General: Well developed, well nourished, NAD  HEENT: OP clear, mucus membranes moist  SKIN: warm, dry. No rashes. Neuro: No focal deficits  Musculoskeletal: Muscle strength 5/5 all ext  Psychiatric: Mood and affect normal  Neck: No JVD, no carotid bruits, no thyromegaly, no lymphadenopathy.  Lungs:Clear bilaterally, no wheezes, rhonci, crackles Cardiovascular: Regular rate and rhythm. No murmurs, gallops or rubs. Abdomen:Soft. Bowel sounds present. Non-tender.    Extremities: No lower extremity edema. Pulses are 2 + in the bilateral DP/PT.  Plan:  1. Atypical chest pain: No true exertional component. Pain occurs at night, mostly with rest per pt report today. No objective evidence of ischemia. If LV function is normal on echo and no evidence of significant valve disease, could discharge home today. We will arrange f/u in our office with Dr. Irish Lack and will likely arrange a stress test at that time.   Lauree Chandler 12/31/2017 2:35 PM

## 2017-12-31 NOTE — Discharge Summary (Signed)
Physician Discharge Summary  Cormac Wint EVO:[id number] DOB: 1926/02/25 DOA: 12/30/2017  PCP: Velna Hatchet, MD  Admit date: 12/30/2017 Discharge date: 12/31/2017  Admitted From: Home Disposition:  Home  Recommendations for Outpatient Follow-up:  1. Follow up with PCP in 1-2 weeks 2. Follow up with Cardiology Dr. Irish Lack as an outpatient for evaluation and arranging a Stress Test 3. Follow up as an outpatient for Annual Imaging of Thoracic Aortic Aneurysm  4. Please obtain CMP/CBC, Mag, Phos in one week 5. Please follow up on the following pending results:  Home Health: No  Equipment/Devices: None  Discharge Condition: Stable  CODE STATUS: FULL CODE  Diet recommendation: Heart Healthy Diet  Brief/Interim Summary: Casey Gates is a 84 y.o. male with medical history significant of 1 month of exertional chest pain with shortness of breath that has been persisting but not escalating.  Patient reports that last 1-2 hours and is relieved usually with relaxation and rest.  He denied any cough, fevers, or any lower extremity edema or swelling. He is never had a heart attack before.  He denies any cardiac workup in the past.  The pain is below his left breast and does not radiate anywhere.  It is not associated with nausea or vomiting.  Patient was walked in the hallway earlier and he did get short of breath but did not have any chest pain and his O2 sats were 92%.  Patient was admitted for CP and worked up. ECHO was done and Troponin were <0.03 x3. Cardiology evaluated and recommending outpatient Stress Test. Patient deemed medically stable to D/C home and will follow up with Cardiology Dr. Irish Lack as an outpatient.   Discharge Diagnoses:  Principal Problem:   Chest pain Active Problems:   Chronic pain   H/O prostate cancer   Exertional shortness of breath  Atypical Chest Pain, improved -Serial cardiac enzymes done and <0.03 x3.   -ECHO showed normal EF of 65-70% with normal Left  Ventricular Diastolic Function -Cardiology arranging follow up and with Dr. Irish Lack for follow up and for outpatient Stress Test  Chronic Pain Syndrome -C/w Home Gabapentin 300 mg po BID and with Oxymorphone 10 mg po q6hprn  Constipation -C/w Bowel Regimen as patient is on chronic Opiates  H/O Prostate Cancer -Follow up as an outpatient   Dyspnea on exertion, improved -D-Dimer Elevated at 1.78 -CTA is negative for PE.   -Chest x-ray no edema.  No evidence of heart failure.   -Obtained Cardiac Echo and EF was 65-70% and LVDF parameters were normal   Thoracic Aorta Aneyursm - Seen on CTA and showed mild aneurysmal dilatation of the ascending Thoracic Aorta -Follow up Annual Imaging by CTA or MRA as an outpatient   OSA -C/w CPAP  Discharge Instructions Discharge Instructions    Call MD for:  difficulty breathing, headache or visual disturbances   Complete by:  As directed    Call MD for:  extreme fatigue   Complete by:  As directed    Call MD for:  hives   Complete by:  As directed    Call MD for:  persistant dizziness or light-headedness   Complete by:  As directed    Call MD for:  persistant nausea and vomiting   Complete by:  As directed    Call MD for:  redness, tenderness, or signs of infection (pain, swelling, redness, odor or green/yellow discharge around incision site)   Complete by:  As directed    Call MD for:  severe uncontrolled  pain   Complete by:  As directed    Call MD for:  temperature >100.4   Complete by:  As directed    Diet - low sodium heart healthy   Complete by:  As directed    Discharge instructions   Complete by:  As directed    Follow up with PCP and Dr. Irish Lack as an outpatient. Patient will need Stress Test. Take all medications as prescribed. If symptoms change or worsen please return to the ED for evaluation.   Increase activity slowly   Complete by:  As directed      Allergies as of 12/31/2017      Reactions   Peanuts [peanut Oil]  Shortness Of Breath   Shortness of breath---all nuts, but can have peanuts, cashews, and almonds   Coconut Flavor [flavoring Agent]    Anything that is related to coconut   Coconut Oil Other (See Comments)   Unknown childhood reaction.        Medication List    TAKE these medications   acetaminophen 325 MG tablet Commonly known as:  TYLENOL Take 650 mg by mouth every 6 (six) hours as needed for fever.   aspirin 325 MG EC tablet Take 1 tablet (325 mg total) by mouth daily. Start taking on:  01/01/2018 What changed:  when to take this   docusate sodium 100 MG capsule Commonly known as:  COLACE Take 1 capsule (100 mg total) by mouth 2 (two) times daily.   gabapentin 300 MG capsule Commonly known as:  NEURONTIN Take 300 mg by mouth 2 (two) times daily. (0900 & 2100)   oxymorphone 10 MG tablet Commonly known as:  OPANA Take 10 mg by mouth every 6 (six) hours as needed for pain.   polyethylene glycol packet Commonly known as:  MIRALAX / GLYCOLAX Take 17 g by mouth 2 (two) times daily.   senna 8.6 MG Tabs tablet Commonly known as:  SENOKOT Take 1 tablet (8.6 mg total) by mouth 2 (two) times daily.       Allergies  Allergen Reactions  . Peanuts [Peanut Oil] Shortness Of Breath    Shortness of breath---all nuts, but can have peanuts, cashews, and almonds  . Coconut Flavor [Flavoring Agent]     Anything that is related to coconut  . Coconut Oil Other (See Comments)    Unknown childhood reaction.     Consultations:  Cardiology Dr. Angelena Form  Procedures/Studies: Dg Chest 2 View  Result Date: 12/30/2017 CLINICAL DATA:  Dyspnea EXAM: CHEST  2 VIEW COMPARISON:  04/14/2016 chest radiograph. FINDINGS: Stable cardiomediastinal silhouette with normal heart size. No pneumothorax. No pleural effusion. Mild eventration of the anterior right hemidiaphragm, stable. No pulmonary edema. Curvilinear opacities at both lung bases. No consolidative airspace disease. Cholecystectomy clips  are seen in the right upper quadrant of the abdomen. IMPRESSION: Bibasilar curvilinear lung opacities, compatible with scarring or atelectasis. Electronically Signed   By: Ilona Sorrel M.D.   On: 12/30/2017 13:17   Ct Angio Chest Pe W And/or Wo Contrast  Result Date: 12/30/2017 CLINICAL DATA:  Chest pain, positive D-dimer EXAM: CT ANGIOGRAPHY CHEST WITH CONTRAST TECHNIQUE: Multidetector CT imaging of the chest was performed using the standard protocol during bolus administration of intravenous contrast. Multiplanar CT image reconstructions and MIPs were obtained to evaluate the vascular anatomy. CONTRAST:  156mL ISOVUE-370 IOPAMIDOL (ISOVUE-370) INJECTION 76% COMPARISON:  Chest x-ray earlier today. FINDINGS: Cardiovascular: No filling defects in the pulmonary arteries to suggest pulmonary emboli. There is cardiomegaly.  Dense diffuse coronary artery calcifications. Moderate aortic calcifications with tortuosity. Mild aneurysmal dilatation of the the proximal ascending thoracic aorta, 4.3 cm. Mediastinum/Nodes: No mediastinal, hilar, or axillary adenopathy. Lungs/Pleura: Areas of atelectasis in the lower lobes bilaterally at the lung bases. No effusions. Upper Abdomen: Imaging into the upper abdomen shows no acute findings. Prior cholecystectomy. Right kidney not visualized, question prior nephrectomy. Musculoskeletal: Chest wall soft tissues are unremarkable. No acute bony abnormality. Review of the MIP images confirms the above findings. IMPRESSION: No evidence of pulmonary embolus. Mild aneurysmal dilatation of the ascending thoracic aorta, 4.3 cm. Recommend annual imaging followup by CTA or MRA. This recommendation follows 2010 ACCF/AHA/AATS/ACR/ASA/SCA/SCAI/SIR/STS/SVM Guidelines for the Diagnosis and Management of Patients with Thoracic Aortic Disease. Circulation. 2010; 121: U235-T614 Cardiomegaly. Bibasilar atelectasis. Electronically Signed   By: Rolm Baptise M.D.   On: 12/30/2017 18:10    ECHOCARDIOGRAM ------------------------------------------------------------------- Study Conclusions  - Left ventricle: The cavity size was normal. Systolic function was   vigorous. The estimated ejection fraction was in the range of 65%   to 70%. Images were inadequate for LV wall motion assessment.   Left ventricular diastolic function parameters were normal. - Aortic valve: Poorly visualized. Trileaflet; normal thickness,   mildly calcified leaflets. There was trivial regurgitation. - Mitral valve: Calcified annulus. - Left atrium: The atrium was mildly dilated. - Pulmonary arteries: Systolic pressure could not be accurately   estimated.  Subjective: Seen and examined and CP resolved. No Nausea or Vomiting. Feeling well and wanting to go home.  Discharge Exam: Vitals:   12/31/17 0900 12/31/17 1300  BP: (!) 121/57 104/68  Pulse: (!) 57 (!) 58  Resp: 18 18  Temp: (!) 97.5 F (36.4 C) 98 F (36.7 C)  SpO2: 95% 94%   Vitals:   12/30/17 2337 12/31/17 0505 12/31/17 0900 12/31/17 1300  BP: 135/77 132/76 (!) 121/57 104/68  Pulse: 81 88 (!) 57 (!) 58  Resp: 18 18 18 18   Temp: 98.2 F (36.8 C) 98.4 F (36.9 C) (!) 97.5 F (36.4 C) 98 F (36.7 C)  TempSrc: Oral Oral Oral Oral  SpO2: 98% 98% 95% 94%  Weight: 67.9 kg (149 lb 9.6 oz) 67.4 kg (148 lb 9.4 oz)    Height: 6' (1.829 m)      General: Pt is alert, awake, not in acute distress Cardiovascular: RRR, S1/S2 +, no rubs, no gallops Respiratory: CTA bilaterally, no wheezing, no rhonchi Abdominal: Soft, NT, ND, bowel sounds + Extremities: no edema, no cyanosis  The results of significant diagnostics from this hospitalization (including imaging, microbiology, ancillary and laboratory) are listed below for reference.    Microbiology: No results found for this or any previous visit (from the past 240 hour(s)).   Labs: BNP (last 3 results) Recent Labs    12/30/17 1531  BNP 43.1   Basic Metabolic Panel: Recent Labs   Lab 12/30/17 1558 12/31/17 0802  NA 138 138  K 4.3 4.4  CL 104 106  CO2 24 23  GLUCOSE 90 90  BUN 20 16  CREATININE 0.88 0.81  CALCIUM 9.0 8.9  MG 2.2 2.2  PHOS  --  2.9   Liver Function Tests: Recent Labs  Lab 12/31/17 0802  AST 29  ALT 17  ALKPHOS 73  BILITOT 0.9  PROT 6.1*  ALBUMIN 3.5   No results for input(s): LIPASE, AMYLASE in the last 168 hours. No results for input(s): AMMONIA in the last 168 hours. CBC: Recent Labs  Lab 12/30/17 1558 12/31/17 0802  WBC 8.9 7.0  NEUTROABS  --  3.7  HGB 15.1 14.9  HCT 45.9 45.4  MCV 91.6 90.6  PLT 151 163   Cardiac Enzymes: Recent Labs  Lab 12/30/17 2024 12/30/17 2309 12/31/17 0303  TROPONINI <0.03 <0.03 <0.03   BNP: Invalid input(s): POCBNP CBG: Recent Labs  Lab 12/30/17 1611  GLUCAP 84   D-Dimer Recent Labs    12/30/17 1558  DDIMER 1.78*   Hgb A1c No results for input(s): HGBA1C in the last 72 hours. Lipid Profile No results for input(s): CHOL, HDL, LDLCALC, TRIG, CHOLHDL, LDLDIRECT in the last 72 hours. Thyroid function studies No results for input(s): TSH, T4TOTAL, T3FREE, THYROIDAB in the last 72 hours.  Invalid input(s): FREET3 Anemia work up No results for input(s): VITAMINB12, FOLATE, FERRITIN, TIBC, IRON, RETICCTPCT in the last 72 hours. Urinalysis    Component Value Date/Time   COLORURINE YELLOW 07/18/2015 2220   APPEARANCEUR CLOUDY (A) 07/18/2015 2220   LABSPEC 1.015 07/18/2015 2220   PHURINE 7.0 07/18/2015 2220   GLUCOSEU NEGATIVE 07/18/2015 2220   HGBUR NEGATIVE 07/18/2015 2220   BILIRUBINUR NEGATIVE 07/18/2015 2220   KETONESUR NEGATIVE 07/18/2015 2220   PROTEINUR NEGATIVE 07/18/2015 2220   UROBILINOGEN 0.2 07/18/2015 2220   NITRITE NEGATIVE 07/18/2015 2220   LEUKOCYTESUR NEGATIVE 07/18/2015 2220   Sepsis Labs Invalid input(s): PROCALCITONIN,  WBC,  LACTICIDVEN Microbiology No results found for this or any previous visit (from the past 240 hour(s)).  Time coordinating  discharge: 35 minutes  SIGNED:  Kerney Elbe, DO [facility] 12/31/2017, 4:31 PM Pager [phone]  If 7PM-7AM, please contact night-coverage [email or web address] Password [email or web address]

## 2018-01-19 NOTE — Progress Notes (Signed)
Cardiology Office Note   Date:  01/20/2018   ID:  Casey Gates, DOB November 25, 1926, MRN [id number]  PCP:  Velna Hatchet, MD    No chief complaint on file.  Chest pain, post hospital  Wt Readings from Last 3 Encounters:  01/20/18 150 lb 12.8 oz (68.4 kg)  12/31/17 148 lb 9.4 oz (67.4 kg)  07/16/17 144 lb 12.8 oz (65.7 kg)       History of Present Illness: Casey Gates is a 84 y.o. male  with a history of sleep apnea. In early August 2016, he began a 30 day test of his C Pap in which he was told that if he did not wear the CPAP for at least 4 hours a night, it would no longer be covered by insurance. This was stressful for him. In early August 2016, he developed some left-sided chest pain about an hour after eating. It was mild. There is no associated nausea or sweating. He went to the emergency room to be checked out. His workup was negative. After this, he was walking at his usual pace. There were no exertional symptoms. He denied any chest discomfort or shortness of breath. Overall, he felt at baseline.  A few weeks later in 2016, he had a similar episode of discomfort in the left side of his chest. He went back to the emergency room and again had a negative workup. He ruled out for MI. Echocardiogram showed no left ventricular dysfunction.  He had mild LVH and evidence of diastolic dysfunction. There is mild AI. No significant structural heart abnormalities.    Since leaving the hospital, he has done well from a cardiac standpoint.  No chest pain.  He feels an abdominal fullness which she attributes to constipation.  He has been on chronic opioids for long-standing back pain.  His biggest complaint is back pain.  He comes to the office today with his son.  After reviewing the records from the hospital, he recalled that our visit today is very similar to the one we had 3 years ago, in which we discussed stress testing versus watchful waiting versus angiography for his chest pain.   Given his severe back pain and lack of cardiac symptoms, at that time we decided against any further cardiac workup.  He has basically done well for 3 years until presenting to the hospital.     Past Medical History:  Diagnosis Date  . Asthma    AS A CHILD  . Blind left eye   . Cancer [facility])    PROSTATE CANCER  . Cellulitis    frequently, "all over"  . Chronic kidney disease   . Chronic pain syndrome   . Constipation   . DDD (degenerative disc disease), lumbar   . Insomnia   . Kyphoscoliosis   . Restless leg     Past Surgical History:  Procedure Laterality Date  . CHOLECYSTECTOMY    . COLONOSCOPY    . PROSTATECTOMY    . QUADRICEPS TENDON REPAIR Left 07/07/2017   Procedure: REPAIR QUADRICEP TENDON;  Surgeon: Rod Can, MD;  Location: [facility];  Service: Orthopedics;  Laterality: Left;  . RIGHT CATARACT EXTRACTION  2013  . RIGTH NEPHRECTOMY  2006  . TONSILLECTOMY    . TRANSURETHRAL RESECTION OF PROSTATE  1997     Current Outpatient Medications  Medication Sig Dispense Refill  . aspirin EC 325 MG EC tablet Take 1 tablet (325 mg total) by mouth daily. 30 tablet 0  . gabapentin (  NEURONTIN) 300 MG capsule Take 300 mg by mouth 2 (two) times daily. (0900 & 2100)    . oxymorphone (OPANA) 10 MG tablet Take 10 mg by mouth every 6 (six) hours as needed for pain.     . polyethylene glycol (MIRALAX / GLYCOLAX) packet Take 17 g by mouth 2 (two) times daily. 14 each 0  . mirtazapine (REMERON) 7.5 MG tablet Take 7.5 mg by mouth daily as needed for sleep.  3  . MOVANTIK 25 MG TABS tablet Take 25 mg by mouth daily.  6   No current facility-administered medications for this visit.     Allergies:   Peanuts [peanut oil]; Coconut flavor [flavoring agent]; and Coconut oil    Social History:  The patient  reports that  has never smoked. he has never used smokeless tobacco. He reports that he does not drink alcohol or use drugs.   Family History:  The patient's family history includes  Arthritis in his mother; Bone cancer in his mother; Hypertension in his mother.    ROS:  Please see the history of present illness.   Otherwise, review of systems are positive for back pain.   All other systems are reviewed and negative.    PHYSICAL EXAM: VS:  BP 124/76   Pulse 72   Ht 6' (1.829 m)   Wt 150 lb 12.8 oz (68.4 kg)   SpO2 96%   BMI 20.45 kg/m  , BMI Body mass index is 20.45 kg/m. GEN: Well nourished, well developed, in no acute distress  HEENT: normal  Neck: no JVD, carotid bruits, or masses Cardiac: RRR; no murmurs, rubs, or gallops,no edema  Respiratory:  clear to auscultation bilaterally, normal work of breathing GI: soft, nontender, nondistended, + BS MS: no deformity or atrophy  Skin: warm and dry, no rash Neuro:  Strength and sensation are intact; uses walker Psych: euthymic mood, full affect   EKG:   The ekg ordered 12/30/17 demonstrates NSR, bifascicular block, no ST changes   Recent Labs: 12/30/2017: B Natriuretic Peptide 56.5 12/31/2017: ALT 17; BUN 16; Creatinine, Ser 0.81; Hemoglobin 14.9; Magnesium 2.2; Platelets 163; Potassium 4.4; Sodium 138   Lipid Panel No results found for: CHOL, TRIG, HDL, CHOLHDL, VLDL, LDLCALC, LDLDIRECT   Other studies Reviewed: Additional studies/ records that were reviewed today with results demonstrating: Hospital records reviewed..   ASSESSMENT AND PLAN:  1. Chest pain: THis has been very atypical in the past.  We did not pursue stress testing or cath when I saw him due to the nature of his sx.  He has done well and I have not seen him since 2016.  We again went over his symptoms that brought him to the hospital.  He currently has no discomfort in his chest.  He complains of only back pain and an abdominal fullness which is relieved when he has a bowel movement.  We had a long discussion regarding further cardiac workup.  Since his most limiting issue is his back which causes him to constantly bend forward and use a  walker, along with taking chronic opioids, but we will not pursue any cardiac testing.  He would not be able to walk on a treadmill.  When I described a pharmacologic stress test, he was even less interested.  He knows to seek medical attention if he has significant chest discomfort.  I think for this patient, if he ruled in for MI, he would be a candidate for angiogram.  Otherwise, we will try to  manage conservatively.  Both the patient nad his son are in agreement about avoiding the risks of testing based on his current sx. 2. Decrease aspirin to 81 mg daily.  LDL 86 in February 2018.  Continue healthy diet.    Current medicines are reviewed at length with the patient today.  The patient concerns regarding his medicines were addressed.  The following changes have been made:  No change  Labs/ tests ordered today include:  No orders of the defined types were placed in this encounter.   Recommend 150 minutes/week of aerobic exercise Low fat, low carb, high fiber diet recommended  Disposition:   FU in 1 year   Signed, Larae Grooms, MD  01/20/2018 11:04 AM    [facility] [address], [address], [place]  [address] Phone: [phone]; Fax: [phone]

## 2018-01-20 ENCOUNTER — Ambulatory Visit: Payer: Medicare Other | Admitting: Interventional Cardiology

## 2018-01-20 ENCOUNTER — Encounter: Payer: Self-pay | Admitting: Interventional Cardiology

## 2018-01-20 VITALS — BP 124/76 | HR 72 | Ht 72.0 in | Wt 150.8 lb

## 2018-01-20 DIAGNOSIS — G8929 Other chronic pain: Secondary | ICD-10-CM | POA: Diagnosis not present

## 2018-01-20 DIAGNOSIS — K5903 Drug induced constipation: Secondary | ICD-10-CM

## 2018-01-20 DIAGNOSIS — R072 Precordial pain: Secondary | ICD-10-CM | POA: Diagnosis not present

## 2018-01-20 DIAGNOSIS — K5900 Constipation, unspecified: Secondary | ICD-10-CM | POA: Insufficient documentation

## 2018-01-20 MED ORDER — ASPIRIN EC 81 MG PO TBEC: 81.0000 mg | DELAYED_RELEASE_TABLET | Freq: Every day | ORAL | 3 refills | Status: DC

## 2018-01-20 NOTE — Patient Instructions (Addendum)
Medication Instructions:  Your physician has recommended you make the following change in your medication:   DECREASE: Aspirin to 81 mg daily  Labwork: None ordered  Testing/Procedures: None ordered  Follow-Up: Your physician wants you to follow-up in: 1 year with Dr. Irish Lack. You will receive a reminder letter in the mail two months in advance. If you don't receive a letter, please call our office to schedule the follow-up appointment.   Any Other Special Instructions Will Be Listed Below (If Applicable).     If you need a refill on your cardiac medications before your next appointment, please call your pharmacy.

## 2018-01-30 DIAGNOSIS — R1900 Intra-abdominal and pelvic swelling, mass and lump, unspecified site: Secondary | ICD-10-CM

## 2018-01-30 HISTORY — DX: Intra-abdominal and pelvic swelling, mass and lump, unspecified site: R19.00

## 2018-02-05 DIAGNOSIS — M545 Low back pain, unspecified: Secondary | ICD-10-CM | POA: Insufficient documentation

## 2018-02-05 DIAGNOSIS — M5136 Other intervertebral disc degeneration, lumbar region: Secondary | ICD-10-CM | POA: Insufficient documentation

## 2018-02-05 DIAGNOSIS — G8929 Other chronic pain: Secondary | ICD-10-CM | POA: Insufficient documentation

## 2018-02-16 ENCOUNTER — Encounter (HOSPITAL_COMMUNITY)
Admission: EM | Disposition: A | Discharge status: Skilled Nursing Facility | Payer: Self-pay | Source: Home / Self Care | Attending: Internal Medicine

## 2018-02-16 ENCOUNTER — Inpatient Hospital Stay (HOSPITAL_COMMUNITY): Payer: Medicare Other | Admitting: Anesthesiology

## 2018-02-16 ENCOUNTER — Encounter (HOSPITAL_COMMUNITY): Payer: Self-pay | Admitting: Emergency Medicine

## 2018-02-16 ENCOUNTER — Inpatient Hospital Stay (HOSPITAL_COMMUNITY)
Admission: EM | Admit: 2018-02-16 | Discharge: 2018-02-24 | DRG: 543 | Disposition: A | Discharge status: Skilled Nursing Facility | Payer: Medicare Other | Attending: Internal Medicine | Admitting: Internal Medicine

## 2018-02-16 ENCOUNTER — Other Ambulatory Visit: Payer: Self-pay

## 2018-02-16 DIAGNOSIS — E441 Mild protein-calorie malnutrition: Secondary | ICD-10-CM | POA: Diagnosis not present

## 2018-02-16 DIAGNOSIS — T402X5S Adverse effect of other opioids, sequela: Secondary | ICD-10-CM | POA: Diagnosis not present

## 2018-02-16 DIAGNOSIS — Z905 Acquired absence of kidney: Secondary | ICD-10-CM

## 2018-02-16 DIAGNOSIS — G2581 Restless legs syndrome: Secondary | ICD-10-CM | POA: Diagnosis not present

## 2018-02-16 DIAGNOSIS — G4733 Obstructive sleep apnea (adult) (pediatric): Secondary | ICD-10-CM | POA: Diagnosis present

## 2018-02-16 DIAGNOSIS — E872 Acidosis: Secondary | ICD-10-CM | POA: Diagnosis present

## 2018-02-16 DIAGNOSIS — R062 Wheezing: Secondary | ICD-10-CM | POA: Diagnosis not present

## 2018-02-16 DIAGNOSIS — K250 Acute gastric ulcer with hemorrhage: Secondary | ICD-10-CM | POA: Diagnosis not present

## 2018-02-16 DIAGNOSIS — J45909 Unspecified asthma, uncomplicated: Secondary | ICD-10-CM | POA: Diagnosis present

## 2018-02-16 DIAGNOSIS — C49A2 Gastrointestinal stromal tumor of stomach: Principal | ICD-10-CM

## 2018-02-16 DIAGNOSIS — Z6820 Body mass index (BMI) 20.0-20.9, adult: Secondary | ICD-10-CM

## 2018-02-16 DIAGNOSIS — K922 Gastrointestinal hemorrhage, unspecified: Secondary | ICD-10-CM | POA: Diagnosis present

## 2018-02-16 DIAGNOSIS — Z91018 Allergy to other foods: Secondary | ICD-10-CM

## 2018-02-16 DIAGNOSIS — D72829 Elevated white blood cell count, unspecified: Secondary | ICD-10-CM | POA: Diagnosis not present

## 2018-02-16 DIAGNOSIS — K3189 Other diseases of stomach and duodenum: Secondary | ICD-10-CM | POA: Diagnosis present

## 2018-02-16 DIAGNOSIS — M6281 Muscle weakness (generalized): Secondary | ICD-10-CM | POA: Diagnosis not present

## 2018-02-16 DIAGNOSIS — I452 Bifascicular block: Secondary | ICD-10-CM | POA: Diagnosis present

## 2018-02-16 DIAGNOSIS — Z79891 Long term (current) use of opiate analgesic: Secondary | ICD-10-CM

## 2018-02-16 DIAGNOSIS — K921 Melena: Secondary | ICD-10-CM | POA: Diagnosis present

## 2018-02-16 DIAGNOSIS — K920 Hematemesis: Secondary | ICD-10-CM | POA: Diagnosis present

## 2018-02-16 DIAGNOSIS — G894 Chronic pain syndrome: Secondary | ICD-10-CM | POA: Diagnosis present

## 2018-02-16 DIAGNOSIS — R195 Other fecal abnormalities: Secondary | ICD-10-CM

## 2018-02-16 DIAGNOSIS — K5903 Drug induced constipation: Secondary | ICD-10-CM | POA: Diagnosis present

## 2018-02-16 DIAGNOSIS — M2550 Pain in unspecified joint: Secondary | ICD-10-CM | POA: Diagnosis not present

## 2018-02-16 DIAGNOSIS — D62 Acute posthemorrhagic anemia: Secondary | ICD-10-CM | POA: Diagnosis present

## 2018-02-16 DIAGNOSIS — H5462 Unqualified visual loss, left eye, normal vision right eye: Secondary | ICD-10-CM | POA: Diagnosis present

## 2018-02-16 DIAGNOSIS — G8929 Other chronic pain: Secondary | ICD-10-CM | POA: Diagnosis not present

## 2018-02-16 DIAGNOSIS — Z8546 Personal history of malignant neoplasm of prostate: Secondary | ICD-10-CM | POA: Diagnosis not present

## 2018-02-16 DIAGNOSIS — K222 Esophageal obstruction: Secondary | ICD-10-CM | POA: Diagnosis present

## 2018-02-16 DIAGNOSIS — R9431 Abnormal electrocardiogram [ECG] [EKG]: Secondary | ICD-10-CM | POA: Diagnosis not present

## 2018-02-16 DIAGNOSIS — T402X5A Adverse effect of other opioids, initial encounter: Secondary | ICD-10-CM | POA: Diagnosis present

## 2018-02-16 DIAGNOSIS — N179 Acute kidney failure, unspecified: Secondary | ICD-10-CM | POA: Diagnosis present

## 2018-02-16 DIAGNOSIS — Z91010 Allergy to peanuts: Secondary | ICD-10-CM | POA: Diagnosis not present

## 2018-02-16 DIAGNOSIS — Z9841 Cataract extraction status, right eye: Secondary | ICD-10-CM

## 2018-02-16 DIAGNOSIS — K449 Diaphragmatic hernia without obstruction or gangrene: Secondary | ICD-10-CM | POA: Diagnosis present

## 2018-02-16 DIAGNOSIS — I959 Hypotension, unspecified: Secondary | ICD-10-CM | POA: Diagnosis present

## 2018-02-16 DIAGNOSIS — Z960 Presence of urogenital implants: Secondary | ICD-10-CM | POA: Diagnosis not present

## 2018-02-16 DIAGNOSIS — R350 Frequency of micturition: Secondary | ICD-10-CM

## 2018-02-16 DIAGNOSIS — M549 Dorsalgia, unspecified: Secondary | ICD-10-CM | POA: Diagnosis not present

## 2018-02-16 DIAGNOSIS — I4510 Unspecified right bundle-branch block: Secondary | ICD-10-CM

## 2018-02-16 DIAGNOSIS — R339 Retention of urine, unspecified: Secondary | ICD-10-CM | POA: Diagnosis not present

## 2018-02-16 DIAGNOSIS — M545 Low back pain: Secondary | ICD-10-CM | POA: Diagnosis present

## 2018-02-16 DIAGNOSIS — Z9889 Other specified postprocedural states: Secondary | ICD-10-CM | POA: Diagnosis not present

## 2018-02-16 DIAGNOSIS — N3010 Interstitial cystitis (chronic) without hematuria: Secondary | ICD-10-CM | POA: Diagnosis not present

## 2018-02-16 DIAGNOSIS — Z9079 Acquired absence of other genital organ(s): Secondary | ICD-10-CM | POA: Diagnosis not present

## 2018-02-16 DIAGNOSIS — D131 Benign neoplasm of stomach: Secondary | ICD-10-CM | POA: Diagnosis not present

## 2018-02-16 DIAGNOSIS — Z79899 Other long term (current) drug therapy: Secondary | ICD-10-CM | POA: Diagnosis not present

## 2018-02-16 DIAGNOSIS — Z808 Family history of malignant neoplasm of other organs or systems: Secondary | ICD-10-CM

## 2018-02-16 DIAGNOSIS — M419 Scoliosis, unspecified: Secondary | ICD-10-CM | POA: Diagnosis present

## 2018-02-16 DIAGNOSIS — Z7982 Long term (current) use of aspirin: Secondary | ICD-10-CM

## 2018-02-16 HISTORY — PX: ESOPHAGOGASTRODUODENOSCOPY (EGD) WITH PROPOFOL: SHX5813

## 2018-02-16 HISTORY — DX: Gastrointestinal hemorrhage, unspecified: K92.2

## 2018-02-16 HISTORY — DX: Intra-abdominal and pelvic swelling, mass and lump, unspecified site: R19.00

## 2018-02-16 LAB — ABO/RH: ABO/RH(D): A POS

## 2018-02-16 LAB — I-STAT CG4 LACTIC ACID, ED
LACTIC ACID, VENOUS: 2.36 mmol/L — AB (ref 0.5–1.9)
Lactic Acid, Venous: 3.75 mmol/L (ref 0.5–1.9)

## 2018-02-16 LAB — I-STAT CHEM 8, ED
BUN: 58 mg/dL — AB (ref 6–20)
CALCIUM ION: 1.1 mmol/L — AB (ref 1.15–1.40)
CHLORIDE: 107 mmol/L (ref 101–111)
CREATININE: 1.1 mg/dL (ref 0.61–1.24)
Glucose, Bld: 126 mg/dL — ABNORMAL HIGH (ref 65–99)
HCT: 23 % — ABNORMAL LOW (ref 39.0–52.0)
Hemoglobin: 7.8 g/dL — ABNORMAL LOW (ref 13.0–17.0)
Potassium: 4.8 mmol/L (ref 3.5–5.1)
SODIUM: 141 mmol/L (ref 135–145)
TCO2: 22 mmol/L (ref 22–32)

## 2018-02-16 LAB — COMPREHENSIVE METABOLIC PANEL
ALK PHOS: 52 U/L (ref 38–126)
ALT: 16 U/L — ABNORMAL LOW (ref 17–63)
ANION GAP: 11 (ref 5–15)
AST: 28 U/L (ref 15–41)
Albumin: 2.7 g/dL — ABNORMAL LOW (ref 3.5–5.0)
BUN: 60 mg/dL — ABNORMAL HIGH (ref 6–20)
CO2: 19 mmol/L — AB (ref 22–32)
Calcium: 8.2 mg/dL — ABNORMAL LOW (ref 8.9–10.3)
Chloride: 109 mmol/L (ref 101–111)
Creatinine, Ser: 1.21 mg/dL (ref 0.61–1.24)
GFR calc non Af Amer: 51 mL/min — ABNORMAL LOW (ref >60)
GFR, EST AFRICAN AMERICAN: 59 mL/min — AB (ref >60)
Glucose, Bld: 134 mg/dL — ABNORMAL HIGH (ref 65–99)
Potassium: 4.9 mmol/L (ref 3.5–5.1)
SODIUM: 139 mmol/L (ref 135–145)
TOTAL PROTEIN: 4.7 g/dL — AB (ref 6.5–8.1)
Total Bilirubin: 0.6 mg/dL (ref 0.3–1.2)

## 2018-02-16 LAB — CBC
HCT: 24 % — ABNORMAL LOW (ref 39.0–52.0)
HEMOGLOBIN: 7.7 g/dL — AB (ref 13.0–17.0)
MCH: 30 pg (ref 26.0–34.0)
MCHC: 32.1 g/dL (ref 30.0–36.0)
MCV: 93.4 fL (ref 78.0–100.0)
Platelets: 188 10*3/uL (ref 150–400)
RBC: 2.57 MIL/uL — AB (ref 4.22–5.81)
RDW: 13.9 % (ref 11.5–15.5)
WBC: 17.2 10*3/uL — AB (ref 4.0–10.5)

## 2018-02-16 LAB — POC OCCULT BLOOD, ED: Fecal Occult Bld: POSITIVE — AB

## 2018-02-16 LAB — LACTIC ACID, PLASMA: Lactic Acid, Venous: 1.3 mmol/L (ref 0.5–1.9)

## 2018-02-16 LAB — HEMOGLOBIN AND HEMATOCRIT, BLOOD
HCT: 27 % — ABNORMAL LOW (ref 39.0–52.0)
HEMOGLOBIN: 9.1 g/dL — AB (ref 13.0–17.0)

## 2018-02-16 LAB — PREPARE RBC (CROSSMATCH)

## 2018-02-16 LAB — HEMOGLOBIN: HEMOGLOBIN: 9.7 g/dL — AB (ref 13.0–17.0)

## 2018-02-16 SURGERY — ESOPHAGOGASTRODUODENOSCOPY (EGD) WITH PROPOFOL
Anesthesia: General | Laterality: Left

## 2018-02-16 MED ORDER — SODIUM CHLORIDE 0.9 % IV SOLN
8.0000 mg/h | INTRAVENOUS | Status: AC
  Administered 2018-02-16 – 2018-02-19 (×5): 8 mg/h via INTRAVENOUS
  Filled 2018-02-16 (×11): qty 80

## 2018-02-16 MED ORDER — LIDOCAINE 2% (20 MG/ML) 5 ML SYRINGE
INTRAMUSCULAR | Status: DC | PRN
  Administered 2018-02-16: 80 mg via INTRAVENOUS

## 2018-02-16 MED ORDER — PHENYLEPHRINE 40 MCG/ML (10ML) SYRINGE FOR IV PUSH (FOR BLOOD PRESSURE SUPPORT)
PREFILLED_SYRINGE | INTRAVENOUS | Status: DC | PRN
  Administered 2018-02-16: 80 ug via INTRAVENOUS
  Administered 2018-02-16: 200 ug via INTRAVENOUS
  Administered 2018-02-16: 120 ug via INTRAVENOUS

## 2018-02-16 MED ORDER — FENTANYL CITRATE (PF) 100 MCG/2ML IJ SOLN
INTRAMUSCULAR | Status: AC
  Filled 2018-02-16: qty 2

## 2018-02-16 MED ORDER — MORPHINE SULFATE (PF) 4 MG/ML IV SOLN
1.0000 mg | INTRAVENOUS | Status: DC | PRN
  Administered 2018-02-18: 1 mg via INTRAVENOUS
  Filled 2018-02-16: qty 1

## 2018-02-16 MED ORDER — SODIUM CHLORIDE 0.9 % IV SOLN
INTRAVENOUS | Status: DC
  Administered 2018-02-17 – 2018-02-19 (×7): via INTRAVENOUS

## 2018-02-16 MED ORDER — ONDANSETRON HCL 4 MG/2ML IJ SOLN
INTRAMUSCULAR | Status: DC | PRN
  Administered 2018-02-16: 4 mg via INTRAVENOUS

## 2018-02-16 MED ORDER — SUCCINYLCHOLINE CHLORIDE 200 MG/10ML IV SOSY
PREFILLED_SYRINGE | INTRAVENOUS | Status: DC | PRN
  Administered 2018-02-16: 120 mg via INTRAVENOUS

## 2018-02-16 MED ORDER — PROPOFOL 10 MG/ML IV BOLUS
INTRAVENOUS | Status: DC | PRN
  Administered 2018-02-16: 120 mg via INTRAVENOUS

## 2018-02-16 MED ORDER — SODIUM CHLORIDE 0.9 % IV SOLN: INTRAVENOUS | Status: DC

## 2018-02-16 MED ORDER — FENTANYL CITRATE (PF) 100 MCG/2ML IJ SOLN
INTRAMUSCULAR | Status: DC | PRN
  Administered 2018-02-16: 50 ug via INTRAVENOUS

## 2018-02-16 MED ORDER — SODIUM CHLORIDE 0.9 % IV SOLN
Freq: Once | INTRAVENOUS | Status: AC
  Administered 2018-02-16: 16:00:00 via INTRAVENOUS

## 2018-02-16 MED ORDER — SODIUM CHLORIDE 0.9 % IV BOLUS (SEPSIS)
1000.0000 mL | Freq: Once | INTRAVENOUS | Status: AC
  Administered 2018-02-16: 1000 mL via INTRAVENOUS

## 2018-02-16 MED ORDER — SODIUM CHLORIDE 0.9 % IJ SOLN
PREFILLED_SYRINGE | INTRAMUSCULAR | Status: DC | PRN
  Administered 2018-02-16: 3 mL

## 2018-02-16 MED ORDER — METOCLOPRAMIDE HCL 5 MG/ML IJ SOLN
10.0000 mg | Freq: Once | INTRAMUSCULAR | Status: AC
  Administered 2018-02-16: 10 mg via INTRAVENOUS
  Filled 2018-02-16: qty 2

## 2018-02-16 MED ORDER — SODIUM CHLORIDE 0.9 % IV SOLN: 1000.0000 mg | INTRAVENOUS | Status: AC

## 2018-02-16 MED ORDER — PANTOPRAZOLE SODIUM 40 MG IV SOLR
40.0000 mg | Freq: Once | INTRAVENOUS | Status: AC
  Administered 2018-02-16: 40 mg via INTRAVENOUS

## 2018-02-16 MED ORDER — PANTOPRAZOLE SODIUM 40 MG IV SOLR
40.0000 mg | Freq: Once | INTRAVENOUS | Status: AC
  Administered 2018-02-16: 40 mg via INTRAVENOUS
  Filled 2018-02-16: qty 40

## 2018-02-16 MED ORDER — DEXAMETHASONE SODIUM PHOSPHATE 10 MG/ML IJ SOLN
INTRAMUSCULAR | Status: DC | PRN
  Administered 2018-02-16: 8 mg via INTRAVENOUS

## 2018-02-16 MED ORDER — PHENYLEPHRINE HCL 10 MG/ML IJ SOLN
INTRAVENOUS | Status: DC | PRN
  Administered 2018-02-16: 50 ug/min via INTRAVENOUS

## 2018-02-16 NOTE — Consult Note (Addendum)
[facility] Consult  Referring Provider: Lorenz Coaster) Primary Care Physician:  Velna Hatchet, MD Primary Gastroenterologist: Althia Forts  Reason for Consultation:  Melena, anemia  HPI: Casey Gates is a 84 y.o. male is a resident at [facility] called EMS as he had multiple episodes of black tarry stools overnight. Patient states he was in his usual state of health until yesterday night when he noticed black stools associated with dizziness and weakness. He was found to be hypotensive with a blood pressure of 70/40and has had multiple episodes of melanotic stool while in the ER. Patient takes a baby aspirin and denies additional NSAIDs use. Normally is constipated(takes oxymorphone 10 mg 4 times a day for back pain) and needs Movantic as well as MiraLAX,but denies prior episodes of black stool or bloody bowel movement. Patient denies abdominal pain, nausea, vomiting, recent change in weight, difficulty swallowing or pain on swallowing. Denies prior endoscopy or colonoscopy.   Past Medical History:  Diagnosis Date  . Asthma    AS A CHILD  . Blind left eye   . Cancer [facility])    PROSTATE CANCER  . Cellulitis    frequently, "all over"  . Chronic kidney disease   . Chronic pain syndrome   . Constipation   . DDD (degenerative disc disease), lumbar   . Insomnia   . Kyphoscoliosis   . Restless leg     Past Surgical History:  Procedure Laterality Date  . CHOLECYSTECTOMY    . COLONOSCOPY    . PROSTATECTOMY    . QUADRICEPS TENDON REPAIR Left 07/07/2017   Procedure: REPAIR QUADRICEP TENDON;  Surgeon: Rod Can, MD;  Location: [place];  Service: Orthopedics;  Laterality: Left;  . RIGHT CATARACT EXTRACTION  2013  . RIGTH NEPHRECTOMY  2006  . TONSILLECTOMY    . TRANSURETHRAL RESECTION OF PROSTATE  1997    Prior to Admission medications   Medication Sig Start Date End Date Taking? Authorizing Provider  aspirin EC 81 MG tablet Take 1 tablet (81  mg total) by mouth daily. 01/20/18   [name], MD  gabapentin (NEURONTIN) 300 MG capsule Take 300 mg by mouth 2 (two) times daily. (0900 & 2100)    [provider]  mirtazapine (REMERON) 7.5 MG tablet Take 7.5 mg by mouth daily as needed for sleep. 01/05/18   [provider]  MOVANTIK 25 MG TABS tablet Take 25 mg by mouth daily. 01/07/18   [provider]  oxymorphone (OPANA) 10 MG tablet Take 10 mg by mouth every 6 (six) hours as needed for pain.     [provider]  polyethylene glycol (MIRALAX / GLYCOLAX) packet Take 17 g by mouth 2 (two) times daily. 08/28/16   [name], MD    Current Facility-Administered Medications  Medication Dose Route Frequency Provider Last Rate Last Dose  . 0.9 %  sodium chloride infusion   Intravenous Once Deno Etienne, DO      . pantoprazole (PROTONIX) 80 mg in sodium chloride 0.9 % 250 mL (0.32 mg/mL) infusion  8 mg/hr Intravenous Continuous Deno Etienne, DO 25 mL/hr at 02/16/18 0900 8 mg/hr at 02/16/18 0900  . sodium chloride 0.9 % bolus 1,000 mL  1,000 mL Intravenous Once Shela Leff, MD       Current Outpatient Medications  Medication Sig Dispense Refill  . aspirin EC 81 MG tablet Take 1 tablet (81 mg total) by mouth daily. 90 tablet 3  . gabapentin (NEURONTIN) 300 MG capsule Take 300  mg by mouth 2 (two) times daily. (0900 & 2100)    . mirtazapine (REMERON) 7.5 MG tablet Take 7.5 mg by mouth daily as needed for sleep.  3  . MOVANTIK 25 MG TABS tablet Take 25 mg by mouth daily.  6  . oxymorphone (OPANA) 10 MG tablet Take 10 mg by mouth every 6 (six) hours as needed for pain.     . polyethylene glycol (MIRALAX / GLYCOLAX) packet Take 17 g by mouth 2 (two) times daily. 14 each 0    Allergies as of 02/16/2018 - Review Complete 02/16/2018  Allergen Reaction Noted  . Peanuts [peanut oil] Shortness Of Breath 12/04/2012  . Coconut flavor [flavoring agent]  07/07/2017  . Coconut oil Other (See Comments)  05/07/2013    Family History  Problem Relation Age of Onset  . Bone cancer Mother   . Hypertension Mother   . Arthritis Mother     Social History   Socioeconomic History  . Marital status: Widowed    Spouse name: Not on file  . Number of children: Not on file  . Years of education: Not on file  . Highest education level: Not on file  Social Needs  . Financial resource strain: Not on file  . Food insecurity - worry: Not on file  . Food insecurity - inability: Not on file  . Transportation needs - medical: Not on file  . Transportation needs - non-medical: Not on file  Occupational History  . Not on file  Tobacco Use  . Smoking status: Never Smoker  . Smokeless tobacco: Never Used  Substance and Sexual Activity  . Alcohol use: No    Alcohol/week: 0.0 oz    Comment: quit 1985, 2 fifths/ week,S/P rehab  . Drug use: No  . Sexual activity: No  Other Topics Concern  . Not on file  Social History Narrative   Caffeine 2-3 cups average.  Widowed,  Lives at [facility] [place].  Retired Forensic psychologist.  One son.    Review of Systems: Positive for: GI: Described in detail in HPI.    Gen: fatigue, weakness, malaise, Denies any fever, chills, rigors, night sweats, anorexia, involuntary weight loss, and sleep disorder CV: Denies chest pain, angina, palpitations, syncope, orthopnea, PND, peripheral edema, and claudication. Resp: Denies dyspnea, cough, sputum, wheezing, coughing up blood. GU : Denies urinary burning, blood in urine, urinary frequency, urinary hesitancy, nocturnal urination, and urinary incontinence. DG:UYQI pain ,Denies other joint pain or swelling.  Denies muscle weakness, cramps, atrophy.  Derm: Denies rash, itching, oral ulcerations, hives, unhealing ulcers.  Psych: Denies depression, anxiety, memory loss, suicidal ideation, hallucinations,  and confusion. Heme: Denies bruising and enlarged lymph nodes. Neuro:  Denies any headaches, dizziness,  paresthesias. Endo:  Denies any problems with DM, thyroid, adrenal function.  Physical Exam: Vital signs in last 24 hours: Temp:  [98.1 F (36.7 C)] 98.1 F (36.7 C) (03/18 0904) Pulse Rate:  [81-93] 89 (03/18 0919) Resp:  [17-44] 44 (03/18 0919) BP: (69-121)/(34-87) 100/58 (03/18 0919) SpO2:  [94 %-100 %] 95 % (03/18 0919)    General:   Alert,  Well-developed, well-nourished, pleasant and cooperative in NAD Head:  Normocephalic and atraumatic. Eyes:  Sclera clear, no icterus.   Prominent pallor Ears:  Normal auditory acuity. Nose:  No deformity, discharge,  or lesions. Mouth:  No deformity or lesions.  Oropharynx pink & moist. Neck:  Supple; no masses or thyromegaly. Lungs:  Clear throughout to auscultation.   No wheezes, crackles, or  rhonchi. No acute distress. Heart:  Regular rate and rhythm; no murmurs, clicks, rubs,  or gallops. Extremities:  Without clubbing or edema. Neurologic:  Alert and  oriented x4;  grossly normal neurologically. Skin:  Intact without significant lesions or rashes. Psych:  Alert and cooperative. Normal mood and affect. Abdomen:  Soft, nontender and nondistended. Mid-abdominal surgical scar, No masses, hepatosplenomegaly or hernias noted. Normal bowel sounds, without guarding, and without rebound.   Grossly melanotic stool noted.       Lab Results: Recent Labs    02/16/18 0650 02/16/18 0732  WBC 17.2*  --   HGB 7.7* 7.8*  HCT 24.0* 23.0*  PLT 188  --    BMET Recent Labs    02/16/18 0650 02/16/18 0732  NA 139 141  K 4.9 4.8  CL 109 107  CO2 19*  --   GLUCOSE 134* 126*  BUN 60* 58*  CREATININE 1.21 1.10  CALCIUM 8.2*  --    LFT Recent Labs    02/16/18 0650  PROT 4.7*  ALBUMIN 2.7*  AST 28  ALT 16*  ALKPHOS 52  BILITOT 0.6   PT/INR No results for input(s): LABPROT, INR in the last 72 hours.  Studies/Results: No results found.  Impression: 1.Melena-hypotensive on presentation. Blood pressure 100/58 currently, HR  91/min BUN/creatinine ratio 60/1.21 compatible with upper GI bleeding.  2. Anemia- hemoglobin 14.9 on 12/14/2017 and 7.7 on 02/16/2018. Appears to have dropped 7 units of Hb from acute blood loss. 3.Leukocytosis,lactic acidosis - lactic acid 3.75 4.Renal impairment, GFR 51,BUN 58, creatinine 1.1  Plan: 1. Agree with volume resuscitation, plan to transfuse 2 units PRBC. 2.H&H every 8 hours, transfuse if hemoglobin less than 7. 3. Protonix 80 mg IV 1 dose, Protonix 8 mg per hour IV drip. 4. Nothing by mouth except ice chips and small sips of water. 5. EGD in a.m.tomorrow after volume resuscitation.  Discussed about the risk and benefits of the procedure with the patient as well as his son by bedside. The understand and verbalize consent.   LOS: 0 days   [name], M.D.  02/16/2018, 9:33 AM  Pager [phone] If no answer or after 5 PM call [phone]

## 2018-02-16 NOTE — ED Notes (Signed)
Attending MD present at bedside - notified of 3 large dark bloody stools. Another NS bolus ordered and started

## 2018-02-16 NOTE — ED Notes (Signed)
Endo called - will transport for EGD today instead of tomorrow

## 2018-02-16 NOTE — H&P (Addendum)
Date: 02/16/2018               Patient Name:  Casey Gates MRN: [id number]  DOB: 20-Oct-1926 Age / Sex: 84 y.o., male   PCP: Velna Hatchet, MD         Medical Service: Internal Medicine Teaching Service         Attending Physician: Dr. Lucious Groves, DO    First Contact: Dr. Jari Favre Pager: [phone]  Second Contact: Dr. Frederico Hamman Pager: [phone]       After Hours (After 5p/  First Contact Pager: [phone]  weekends / holidays): Second Contact Pager: [phone]   Chief Complaint: black, tarry stool  History of Present Illness: Patient is a 84 year old male with a past medical history of asthma, prostate cancer presenting to the hospital from [facility] ([place]) for black, tarry diarrhea for 2 days.  Patient states he has chronic constipation for which he takes Movantik and Miralax.  States 2 days ago he started having black tarry liquid diarrhea; 20 episodes since yesterday.  Reports having diffuse abdominal pain and dizziness for the past 2 days. He denies having any nausea or vomiting. Reports having a low appetite for the past few days.  States he takes a baby aspirin daily.  Denies any NSAID use.  Son at bedside states patient lives independently but he comes and checks on him.  Per son, patient had a low-grade fever yesterday (99.4 F).  Patient denies having any chest pain, cough, or shortness of breath.  Denies having any dysuria.  Does state he was urinating more frequently yesterday as he was drinking a lot of water.  In the ED, initial blood pressure 70/40. Noted to have gross melena on exam. Per nursing staff, he has had 3 episodes of black clotted  and liquid consistency stool since he has been in the emergency room.  FOBT positive.  On labs, noted to have a seven-point drop in his hemoglobin.  Hemoglobin 7.7 on admission, was 14.9 a month ago.  Lactic acid 3.75.  White count 17.2.  BUN 60 and creatinine 1.2.  He received 1 L IV fluid bolus in the ED.  ED  physician spoke to GI (Dr. [name]) who recommended a PPI drip and will be evaluating the patient.   Meds:  No outpatient medications have been marked as taking for the 02/16/18 encounter [facility] Encounter).     Allergies: Allergies as of 02/16/2018 - Review Complete 02/16/2018  Allergen Reaction Noted  . Peanuts [peanut oil] Shortness Of Breath 12/04/2012  . Coconut flavor [flavoring agent]  07/07/2017  . Coconut oil Other (See Comments) 05/07/2013   Past Medical History:  Diagnosis Date  . Asthma    AS A CHILD  . Blind left eye   . Cancer [facility])    PROSTATE CANCER  . Cellulitis    frequently, "all over"  . Chronic kidney disease   . Chronic pain syndrome   . Constipation   . DDD (degenerative disc disease), lumbar   . Insomnia   . Kyphoscoliosis   . Restless leg     Family History: Mother with bone cancer, hypertension, and arthritis.  Social History: No tobacco, ethanol, or illicit drug use.  Review of Systems: A complete ROS was negative except as per HPI.  Physical Exam: Blood pressure 106/87, pulse 88, resp. rate (!) 21, SpO2 100 %. Physical Exam  Constitutional: He is oriented to person, place, and time. No distress.  Cachectic elderly  male lying comfortably in hospital stretcher   HENT:  Head: Normocephalic and atraumatic.  Mouth/Throat: Oropharynx is clear and moist.  Eyes:  Conjunctival pallor   Neck: Neck supple. No tracheal deviation present.  Cardiovascular: Normal rate, regular rhythm and intact distal pulses. Exam reveals no gallop and no friction rub.  Distant heart sounds   Pulmonary/Chest: Effort normal and breath sounds normal. No respiratory distress. He has no wheezes. He has no rales.  Abdominal: Soft. Bowel sounds are normal. He exhibits no distension. There is no tenderness. There is no guarding.  Genitourinary:  Genitourinary Comments: Gross melena   Musculoskeletal: He exhibits no edema.  Neurological: He is alert and oriented to  person, place, and time.  Skin: Skin is warm and dry.   EKG: personally reviewed my interpretation is sinus rhythm with PACs.  Right bundle branch block and left anterior fascicular block similar to prior EKG.  Questionable T wave inversion in lead V2.   Assessment & Plan by Problem: Active Problems:   GI bleed  GI bleed: Likely upper GI source. Patient is presenting with a 2-day history of black, tarry liquid stool and a seven-point drop in his hemoglobin. Hemoglobin 7.7 on admission, a month ago it was 14.9.  FOBT+. No prior colonoscopy or EGD results in the chart.  He presented hypotensive with a blood pressure of 70/40 and lactic acid 3.75.  Status post 1 L IV fluid in the ED.  2 units of blood have been ordered in the ED. GI planning EGD tomorrow morning after volume resuscitation.  -Cardiac monitoring  -Protonix 80 mg IV 1 dose, Protonix 8 mg per hour IV drip -Normal saline infusion + another 1 L bolus has been ordered -Continue trending lactic acid and IVF resuscitation  -H/H q8 hrs. Transfuse with packed red blood cells if Hgb <7.  -Orthostatic vitals  -Appreciate GI recs  Acute kidney injury: Likely prerenal in the setting of GI bleed. BUN 60 and creatinine 1.2.  Baseline creatinine is 0.8. -Continue IV fluid hydration -Monitor I/O -BMP in am  Leukocytosis: White count 17.2 likely related to stress. Lactic acid elevated at 3.75 in the setting of hypoperfusion. No symptoms to suggest respiratory tract infection. Afebrile in the hospital. Will check UA to r/o UTI in the setting of low grade fever at home and urinary frequency yesterday.  -CBC in a.m. -Continue to trend lactic acid -UA  Questionable TWI on EKG: Sinus rhythm with PACs.  Right bundle branch block and left anterior fascicular block similar to prior EKG.  Questionable T wave inversion in lead V2.  Patient appears very comfortable and denies having any chest pain or shortness of breath. -Repeat EKG in a.m.  Chronic  pain: Patient has chronic back and joint pains for which he takes Oxymorphone at home.  -Morphine 1 mg q4 prn  Diet: Nothing by mouth except ice chips and small sips of water  DVT ppx: SCDs  Code status: Patient wishes to be FULL CODE.   Dispo: Admit patient to Inpatient with expected length of stay greater than 2 midnights.  Signed: Shela Leff, MD 02/16/2018, 8:35 AM  Pager: [phone]

## 2018-02-16 NOTE — Brief Op Note (Signed)
02/16/2018  4:41 PM  PATIENT:  Casey Gates  84 y.o. male  PRE-OPERATIVE DIAGNOSIS:  melena, anemia  POST-OPERATIVE DIAGNOSIS:  esophagitis, gastric mass, with ulcer, epi ijected 2 clips placed, hiatal hernia, schatzki'sring  PROCEDURE:  Procedure(s): ESOPHAGOGASTRODUODENOSCOPY (EGD) WITH PROPOFOL (Left)  SURGEON:  Surgeon(s) and Role:    [name], MD - Primary  PHYSICIAN ASSISTANT:   ASSISTANTS:Jennifer Kappus, RN, Laurena Spies, Tech ANESTHESIA:   MAC  EBL:  None  BLOOD ADMINISTERED:none  DRAINS: none   LOCAL MEDICATIONS USED:  NONE  SPECIMEN:  No Specimen  DISPOSITION OF SPECIMEN:  N/A  COUNTS:  YES  TOURNIQUET:  * No tourniquets in log *  DICTATION: .Dragon Dictation  PLAN OF CARE: Admit to inpatient   PATIENT DISPOSITION:  PACU - hemodynamically stable.   Delay start of Pharmacological VTE agent (>24hrs) due to surgical blood loss or risk of bleeding: yes

## 2018-02-16 NOTE — ED Notes (Signed)
Pt was cleaned by staff. A large amount of black stool was in his brief and dried stool noted down to knees .  Reddened, raw areas to both hips.

## 2018-02-16 NOTE — ED Notes (Signed)
EDP Floyd to bedside.

## 2018-02-16 NOTE — ED Notes (Signed)
Attempted to call report x2

## 2018-02-16 NOTE — ED Provider Notes (Signed)
[facility] EMERGENCY DEPARTMENT Provider Note   CSN: [id number] Arrival date & time: 02/16/18  6269     History   Chief Complaint Chief Complaint  Patient presents with  . GI Bleeding    HPI Casey Gates is a 84 y.o. male.  84 yo M with a chief complaint of blood in his stool.  The patient has had multiple bowel movements since last night.  He was recently taking laxatives because he was constipated.  Started feeling lightheaded when he went to sit up this evening and so came to the emergency department.  Denied chest pain or shortness of breath.  Has had some transient abdominal cramping prior to bowel movements.  Denies syncopal event.  Denies history of the same.  Denies prior evaluation by a gastroenterologist.  He denies NSAID use.  Has been taking baby aspirin.   The history is provided by the patient.  Illness  This is a new problem. The current episode started 2 days ago. The problem occurs constantly. The problem has been gradually worsening. Pertinent negatives include no chest pain, no abdominal pain, no headaches and no shortness of breath. Nothing aggravates the symptoms. Nothing relieves the symptoms. He has tried nothing for the symptoms. The treatment provided no relief.    Past Medical History:  Diagnosis Date  . Asthma    AS A CHILD  . Blind left eye   . Cancer [facility])    PROSTATE CANCER  . Cellulitis    frequently, "all over"  . Chronic kidney disease   . Chronic pain syndrome   . Constipation   . DDD (degenerative disc disease), lumbar   . Insomnia   . Kyphoscoliosis   . Restless leg     Patient Active Problem List   Diagnosis Date Noted  . GI bleed 02/16/2018  . Constipation 01/20/2018  . Exertional shortness of breath 12/30/2017  . Rupture of left quadriceps muscle 07/07/2017  . Rupture of left quadriceps tendon 06/26/2017  . Eczema 06/26/2017  . Chest pain 07/29/2015  . OSA on CPAP 07/10/2015  . CPAP use counseling  07/10/2015  . Excessive daytime sleepiness 02/22/2015  . Sleep apnea 02/22/2015  . Snoring 02/22/2015  . Nocturia more than twice per night 02/22/2015  . Syncope 12/31/2012  . Bifascicular bundle branch block 12/31/2012    Class: Acute  . Cellulitis and abscess of leg, except foot 12/04/2012  . Chronic pain 12/04/2012  . Kyphosis 12/04/2012  . H/O unilateral nephrectomy 12/04/2012  . H/O prostate cancer 12/04/2012  . S/P TURP 12/04/2012    Past Surgical History:  Procedure Laterality Date  . CHOLECYSTECTOMY    . COLONOSCOPY    . PROSTATECTOMY    . QUADRICEPS TENDON REPAIR Left 07/07/2017   Procedure: REPAIR QUADRICEP TENDON;  Surgeon: Rod Can, MD;  Location: [name];  Service: Orthopedics;  Laterality: Left;  . RIGHT CATARACT EXTRACTION  2013  . RIGTH NEPHRECTOMY  2006  . TONSILLECTOMY    . TRANSURETHRAL RESECTION OF PROSTATE  1997       Home Medications    Prior to Admission medications   Medication Sig Start Date End Date Taking? Authorizing Provider  aspirin EC 81 MG tablet Take 1 tablet (81 mg total) by mouth daily. 01/20/18   [name], MD  gabapentin (NEURONTIN) 300 MG capsule Take 300 mg by mouth 2 (two) times daily. (0900 & 2100)    [provider]  mirtazapine (REMERON) 7.5 MG tablet Take 7.5 mg  by mouth daily as needed for sleep. 01/05/18   [provider]  MOVANTIK 25 MG TABS tablet Take 25 mg by mouth daily. 01/07/18   [provider]  oxymorphone (OPANA) 10 MG tablet Take 10 mg by mouth every 6 (six) hours as needed for pain.     [provider]  polyethylene glycol (MIRALAX / GLYCOLAX) packet Take 17 g by mouth 2 (two) times daily. 08/28/16   [name], MD    Family History Family History  Problem Relation Age of Onset  . Bone cancer Mother   . Hypertension Mother   . Arthritis Mother     Social History Social History   Tobacco Use  . Smoking status: Never Smoker  . Smokeless tobacco: Never  Used  Substance Use Topics  . Alcohol use: No    Alcohol/week: 0.0 oz    Comment: quit 1985, 2 fifths/ week,S/P rehab  . Drug use: No     Allergies   Peanuts [peanut oil]; Coconut flavor [flavoring agent]; and Coconut oil   Review of Systems Review of Systems  Constitutional: Negative for chills and fever.  HENT: Negative for congestion and facial swelling.   Eyes: Negative for discharge and visual disturbance.  Respiratory: Negative for shortness of breath.   Cardiovascular: Negative for chest pain and palpitations.  Gastrointestinal: Positive for blood in stool and diarrhea. Negative for abdominal pain and vomiting.  Musculoskeletal: Negative for arthralgias and myalgias.  Skin: Negative for color change and rash.  Neurological: Positive for light-headedness. Negative for tremors, syncope and headaches.  Psychiatric/Behavioral: Negative for confusion and dysphoric mood.     Physical Exam Updated Vital Signs BP 106/87   Pulse 88   Resp (!) 21   SpO2 100%   Physical Exam  Constitutional: He is oriented to person, place, and time. He appears well-developed and well-nourished.  HENT:  Head: Normocephalic and atraumatic.  Eyes: EOM are normal. Pupils are equal, round, and reactive to light.  Neck: Normal range of motion. Neck supple. No JVD present.  Cardiovascular: Normal rate and regular rhythm. Exam reveals no gallop and no friction rub.  No murmur heard. Pulmonary/Chest: No respiratory distress. He has no wheezes.  Abdominal: He exhibits no distension and no mass. There is no tenderness. There is no rebound and no guarding.  Genitourinary:  Genitourinary Comments: No hemorrhoids.  Gross melena  Musculoskeletal: Normal range of motion.  Neurological: He is alert and oriented to person, place, and time.  Skin: No rash noted. No pallor.  Psychiatric: He has a normal mood and affect. His behavior is normal.  Nursing note and vitals reviewed.    ED Treatments /  Results  Labs (all labs ordered are listed, but only abnormal results are displayed) Labs Reviewed  COMPREHENSIVE METABOLIC PANEL - Abnormal; Notable for the following components:      Result Value   CO2 19 (*)    Glucose, Bld 134 (*)    BUN 60 (*)    Calcium 8.2 (*)    Total Protein 4.7 (*)    Albumin 2.7 (*)    ALT 16 (*)    GFR calc non Af Amer 51 (*)    GFR calc Af Amer 59 (*)    All other components within normal limits  CBC - Abnormal; Notable for the following components:   WBC 17.2 (*)    RBC 2.57 (*)    Hemoglobin 7.7 (*)    HCT 24.0 (*)    All other  components within normal limits  POC OCCULT BLOOD, ED - Abnormal; Notable for the following components:   Fecal Occult Bld POSITIVE (*)    All other components within normal limits  I-STAT CG4 LACTIC ACID, ED - Abnormal; Notable for the following components:   Lactic Acid, Venous 3.75 (*)    All other components within normal limits  I-STAT CHEM 8, ED - Abnormal; Notable for the following components:   BUN 58 (*)    Glucose, Bld 126 (*)    Calcium, Ion 1.10 (*)    Hemoglobin 7.8 (*)    HCT 23.0 (*)    All other components within normal limits  I-STAT CG4 LACTIC ACID, ED  TYPE AND SCREEN  PREPARE RBC (CROSSMATCH)  ABO/RH    EKG  EKG Interpretation  Date/Time:  Monday February 16 2018 06:54:13 EDT Ventricular Rate:  77 PR Interval:    QRS Duration: 127 QT Interval:  398 QTC Calculation: 451 R Axis:   -77 Text Interpretation:  Sinus rhythm Atrial premature complex RBBB and LAFB Borderline ST elevation, lateral leads No significant change since last tracing Confirmed by Deno Etienne [phone]) on 02/16/2018 7:38:37 AM Also confirmed by Deno Etienne [phone]), editor Hattie Perch ([phone])  on 02/16/2018 7:59:15 AM       Radiology No results found.  Procedures Procedures (including critical care time)  Medications Ordered in ED Medications  0.9 %  sodium chloride infusion (not administered)  pantoprazole  (PROTONIX) 80 mg in sodium chloride 0.9 % 250 mL (0.32 mg/mL) infusion (not administered)  pantoprazole (PROTONIX) injection 40 mg (not administered)  sodium chloride 0.9 % bolus 1,000 mL (1,000 mLs Intravenous New Bag/Given 02/16/18 0726)  pantoprazole (PROTONIX) injection 40 mg (40 mg Intravenous Given 02/16/18 0745)     Initial Impression / Assessment and Plan / ED Course  I have reviewed the triage vital signs and the nursing notes.  Pertinent labs & imaging results that were available during my care of the patient were reviewed by me and considered in my medical decision making (see chart for details).     84 yo M with a chief complaint of blood in the stool.  The patient has gross melena on exam.  He is hypertensive initially.  We will give a liter of fluids.  Check labs.  The patient's blood pressure improved with IV fluids.  The patient continues to have some melanotic output.  With his initial hypotension and he has an 8 g drop of hemoglobin I will transfuse 2 units.  I discussed the case with gastroenterology, Dr. [name], recommended starting the patient on a Protonix drip post bolus.  She will evaluate.  Discussed with internal medicine for admission.  CRITICAL CARE Performed by: Cecilio Asper   Total critical care time: 35 minutes  Critical care time was exclusive of separately billable procedures and treating other patients.  Critical care was necessary to treat or prevent imminent or life-threatening deterioration.  Critical care was time spent personally by me on the following activities: development of treatment plan with patient and/or surrogate as well as nursing, discussions with consultants, evaluation of patient's response to treatment, examination of patient, obtaining history from patient or surrogate, ordering and performing treatments and interventions, ordering and review of laboratory studies, ordering and review of radiographic studies, pulse oximetry and  re-evaluation of patient's condition.   The patients results and plan were reviewed and discussed.   Any x-rays performed were independently reviewed by myself.   Differential diagnosis were  considered with the presenting HPI.  Medications  0.9 %  sodium chloride infusion (not administered)  pantoprazole (PROTONIX) 80 mg in sodium chloride 0.9 % 250 mL (0.32 mg/mL) infusion (not administered)  pantoprazole (PROTONIX) injection 40 mg (not administered)  sodium chloride 0.9 % bolus 1,000 mL (1,000 mLs Intravenous New Bag/Given 02/16/18 0726)  pantoprazole (PROTONIX) injection 40 mg (40 mg Intravenous Given 02/16/18 0745)    Vitals:   02/16/18 0654 02/16/18 0700 02/16/18 0715 02/16/18 0745  BP: (!) 84/49 (!) 69/50 (!) 85/59 106/87  Pulse: 81   88  Resp: 18 19 18  (!) 21  SpO2: 96%   100%    Final diagnoses:  Acute GI bleeding    Admission/ observation were discussed with the admitting physician, patient and/or family and they are comfortable with the plan.    Final Clinical Impressions(s) / ED Diagnoses   Final diagnoses:  Acute GI bleeding    ED Discharge Orders    None       Deno Etienne, DO 02/16/18 [phone]

## 2018-02-16 NOTE — ED Notes (Signed)
Attempted to call report x 1  

## 2018-02-16 NOTE — Anesthesia Procedure Notes (Signed)
Procedure Name: Intubation Date/Time: 02/16/2018 4:10 PM Performed by: Wilburn Cornelia, CRNA Pre-anesthesia Checklist: Patient identified, Emergency Drugs available, Suction available, Patient being monitored and Timeout performed Patient Re-evaluated:Patient Re-evaluated prior to induction Oxygen Delivery Method: Circle system utilized Preoxygenation: Pre-oxygenation with 100% oxygen Induction Type: IV induction, Rapid sequence and Cricoid Pressure applied Laryngoscope Size: Mac and 4 Grade View: Grade I Tube type: Oral Tube size: 7.5 mm Number of attempts: 1 Airway Equipment and Method: Stylet Placement Confirmation: ETT inserted through vocal cords under direct vision,  positive ETCO2 and breath sounds checked- equal and bilateral Secured at: 23 cm Tube secured with: Tape Dental Injury: Teeth and Oropharynx as per pre-operative assessment

## 2018-02-16 NOTE — ED Notes (Signed)
Per GI, EGD will be done tomorrow. Also, rectal tube NOT recommended per GI.

## 2018-02-16 NOTE — ED Notes (Signed)
Attending rounding at bedside °

## 2018-02-16 NOTE — H&P (View-Only) (Signed)
I was informed by the nurse that the patient had a moderate amount of bloody emesis. A small cupful size of clotted blood noted on floor at bedside. Patient currently remains hemodynamically stable,blood pressure was 120/67, heart rate 89/m. He has received 2 units of PRBC transfusion. Discussed with ENDO team,plan endoscopy today with propofol. Earliest anesthesia availability currently at 4 PM today. Discussed about performing an endoscopy today with the patient and son at bedside. Will give erythromycin 1000 mg IV 1 dose and Reglan 10 mg IV 1 dose,as patient may have clots in stomach which may interfere with visualization. Continue Protonix drip at 8 mg per hour.  [name], MD [facility]

## 2018-02-16 NOTE — ED Notes (Signed)
Pt cleaned from another stool. Smaller amount of stool, thicker consistency. MD made aware.

## 2018-02-16 NOTE — Op Note (Signed)
[facility] Patient Name: Casey Gates Procedure Date : 02/16/2018 MRN: [id number] Attending MD: [name] , MD Date of Birth: [birth year] CSN: [id number] Age: 84 Admit Type: Emergency Department Procedure:                Upper GI endoscopy Indications:              Hematemesis, Melena Providers:                [name], MD, Kingsley Plan, RN, Laurena Spies, Technician Referring MD:              Medicines:                Monitored Anesthesia Care Complications:            No immediate complications. Estimated Blood Loss:     Estimated blood loss: none. Procedure:                Pre-Anesthesia Assessment:                           - Prior to the procedure, a History and Physical                            was performed, and patient medications and                            allergies were reviewed. The patient's tolerance of                            previous anesthesia was also reviewed. The risks                            and benefits of the procedure and the sedation                            options and risks were discussed with the patient.                            All questions were answered, and informed consent                            was obtained. Prior Anticoagulants: The patient has                            taken aspirin, last dose was 1 day prior to                            procedure. ASA Grade Assessment: III - A patient                            with severe systemic disease. After reviewing the  risks and benefits, the patient was deemed in                            satisfactory condition to undergo the procedure.                           After obtaining informed consent, the endoscope was                            passed under direct vision. Throughout the                            procedure, the patient's blood pressure, pulse, and                            oxygen saturations  were monitored continuously. The                            ([id number]) [id number] was introduced through the                            mouth, and advanced to the second part of duodenum.                            The upper GI endoscopy was accomplished without                            difficulty. The patient tolerated the procedure                            well. Scope In: Scope Out: Findings:      The upper third of the esophagus and middle third of the esophagus were       normal.      A 5 cm hiatal hernia was present.      A widely patent Schatzki ring (acquired) was found in the lower third of       the esophagus.      A large amount of dark red,coffee ground colored as well as clotted       blood was found in the gastric body obscuring complete visualization of       the cardia and fundus..      A large, submucosal partially circumferential (involving one-third of       the lumen circumference) mass with no bleeding but stigmata of recent       bleeding was found on the lesser curvature of the stomach. There was a       small area of ulceration with pigmentation noted, this area was       successfully injected with 3 mL of a 1:10,000 solution of epinephrine       for hemostasis. For hemostasis, two hemostatic clips were successfully       placed. There was no bleeding at the end of the procedure.      The examined duodenum was normal. Impression:               - Normal upper third of esophagus and middle third  of esophagus.                           - 5 cm hiatal hernia.                           - Widely patent Schatzki ring.                           - Clotted blood in the gastric body.                           - Rule out malignancy, gastric tumor on the lesser                            curvature of the stomach. Injected. Clips were                            placed.                           - Normal examined duodenum.                            - No specimens collected. Moderate Sedation:      Patient did not receive moderate sedation for this procedure, but       instead received monitored anesthesia care. Recommendation:           - Clear liquid diet.                           - Continue present medications.                           - Perform a CT scan (computed tomography) of chest                            with contrast, abdomen with contrast and pelvis                            with contrast at appointment to be scheduled.                           - Return patient to hospital ward for ongoing care. Procedure Code(s):        --- Professional ---                           [phone], Esophagogastroduodenoscopy, flexible,                            transoral; with control of bleeding, any method Diagnosis Code(s):        --- Professional ---                           K44.9, Diaphragmatic hernia without obstruction or  gangrene                           K22.2, Esophageal obstruction                           K92.2, Gastrointestinal hemorrhage, unspecified                           D49.0, Neoplasm of unspecified behavior of                            digestive system                           K92.0, Hematemesis                           K92.1, Melena (includes Hematochezia) CPT copyright 2016 [employer]. All rights reserved. The codes documented in this report are preliminary and upon coder review may  be revised to meet current compliance requirements. [name], MD 02/16/2018 4:41:02 PM This report has been signed electronically. Number of Addenda: 0

## 2018-02-16 NOTE — Transfer of Care (Signed)
Immediate Anesthesia Transfer of Care Note  Patient: Casey Gates  Procedure(s) Performed: ESOPHAGOGASTRODUODENOSCOPY (EGD) WITH PROPOFOL (Left )  Patient Location: Endoscopy Unit  Anesthesia Type:General  Level of Consciousness: awake and alert   Airway & Oxygen Therapy: Patient Spontanous Breathing  Post-op Assessment: Report given to RN and Post -op Vital signs reviewed and stable  Post vital signs: Reviewed and stable  Last Vitals:  Vitals:   02/16/18 1500 02/16/18 1526  BP: (!) 125/54 123/60  Pulse: 94   Resp: (!) 25 (!) 22  Temp:  37.6 C  SpO2: 96% 96%    Last Pain:  Vitals:   02/16/18 1526  TempSrc: Oral         Complications: No apparent anesthesia complications

## 2018-02-16 NOTE — Progress Notes (Signed)
Notified MD on-call about patient temperature of 100.5. Will continue to monitor patient and report to next shift. Leanne Chang, RN

## 2018-02-16 NOTE — Progress Notes (Addendum)
I was informed by the nurse that the patient had a moderate amount of bloody emesis. A small cupful size of clotted blood noted on floor at bedside. Patient currently remains hemodynamically stable,blood pressure was 120/67, heart rate 89/m. He has received 2 units of PRBC transfusion. Discussed with ENDO team,plan endoscopy today with propofol. Earliest anesthesia availability currently at 4 PM today. Discussed about performing an endoscopy today with the patient and son at bedside. Will give erythromycin 1000 mg IV 1 dose and Reglan 10 mg IV 1 dose,as patient may have clots in stomach which may interfere with visualization. Continue Protonix drip at 8 mg per hour.  [name], MD [facility]

## 2018-02-16 NOTE — Interval H&P Note (Signed)
History and Physical Interval Note: 89/male with melena, hematemesis and anemia for EGD.  02/16/2018 3:50 PM  Casey Gates  has presented today for EGD, with the diagnosis of melena, anemia  The various methods of treatment have been discussed with the patient and family. After consideration of risks, benefits and other options for treatment, the patient has consented to  Procedure(s): ESOPHAGOGASTRODUODENOSCOPY (EGD) WITH PROPOFOL (Left) as a surgical intervention .  The patient's history has been reviewed, patient examined, no change in status, stable for surgery.  I have reviewed the patient's chart and labs.  Questions were answered to the patient's satisfaction.     [name]

## 2018-02-16 NOTE — Anesthesia Preprocedure Evaluation (Signed)
Anesthesia Evaluation  Patient identified by MRN, date of birth, ID band Patient awake    Reviewed: Allergy & Precautions, NPO status , Patient's Chart, lab work & pertinent test results  Airway Mallampati: II  TM Distance: >3 FB Neck ROM: Full    Dental  (+) Teeth Intact, Dental Advisory Given   Pulmonary asthma , sleep apnea ,    Pulmonary exam normal breath sounds clear to auscultation       Cardiovascular Normal cardiovascular exam+ dysrhythmias (Bundle branch block)  Rhythm:Regular Rate:Normal  Echo 12/31/17: Study Conclusions  - Left ventricle: The cavity size was normal. Systolic function was vigorous. The estimated ejection fraction was in the range of 65% to 70%. Images were inadequate for LV wall motion assessment. Left ventricular diastolic function parameters were normal. - Aortic valve: Poorly visualized. Trileaflet; normal thickness, mildly calcified leaflets. There was trivial regurgitation. - Mitral valve: Calcified annulus. - Left atrium: The atrium was mildly dilated. - Pulmonary arteries: Systolic pressure could not be accurately estimated.    Neuro/Psych negative neurological ROS  negative psych ROS   GI/Hepatic negative GI ROS, Neg liver ROS, Hematemesis    Endo/Other  negative endocrine ROS  Renal/GU Renal InsufficiencyRenal disease     Musculoskeletal  (+) Arthritis , Chronic pain syndrome    Abdominal   Peds  Hematology  (+) Blood dyscrasia, anemia ,   Anesthesia Other Findings Day of surgery medications reviewed with the patient.  Reproductive/Obstetrics                             Anesthesia Physical Anesthesia Plan  ASA: III and emergent  Anesthesia Plan: General   Post-op Pain Management:    Induction: Intravenous and Rapid sequence  PONV Risk Score and Plan: 2 and Treatment may vary due to age or medical condition, Dexamethasone and  Ondansetron  Airway Management Planned: Oral ETT  Additional Equipment:   Intra-op Plan:   Post-operative Plan: Extubation in OR  Informed Consent: I have reviewed the patients History and Physical, chart, labs and discussed the procedure including the risks, benefits and alternatives for the proposed anesthesia with the patient or authorized representative who has indicated his/her understanding and acceptance.   Dental advisory given  Plan Discussed with: CRNA  Anesthesia Plan Comments:         Anesthesia Quick Evaluation

## 2018-02-16 NOTE — Op Note (Signed)
EGD was performed as patient presented with melena and 1 episode of hematemesis. Findings:  Widely patent Schatzki's ring found in the lower third of esophagus with a 5 cm hiatal hernia. Coffee-ground colored fluid noted in esophagus, normal underlying mucosa on lavage. Large amount of dark red, coffee-ground colored as well as clotted blood, found in the gastric body obscuring complete visualization of cardia and fundus. A large submucosal partially circumferential involving one third of the luminal circumference, mass was noted in the lesser curvature of the stomach. There was an area of ulceration and pigmentation noted at the base. This ulcerated area was injected with 3 mL of 1 in 10,000 epinephrine and placement of 2 endoclips. Duodenal bulb and rest of the duodenum appeared unremarkable.   Recommendation: Clear liquid diet. CAT scan of the chest abdomen and pelvis, as gastric mass appears malignant and submucosal. Monitor H&H and transfuse to keep hemoglobin above 7. Continue Protonix drip for now.  [name], M.D.

## 2018-02-16 NOTE — ED Notes (Signed)
Moderate amount of bloody sputum/epigastric contents found on floor beside bed. Pt states he vomitted blood. MD notified

## 2018-02-16 NOTE — ED Triage Notes (Signed)
Per EMS pt from [facility]).  Black, tarry diarrhea x 2 days. Tonight became so weak he could not get up to the bathroom and felt he could not control his bowels.  Initial blood pressure 70/40, 400 mL NS given en route.

## 2018-02-17 ENCOUNTER — Inpatient Hospital Stay (HOSPITAL_COMMUNITY): Payer: Medicare Other

## 2018-02-17 ENCOUNTER — Other Ambulatory Visit: Payer: Self-pay

## 2018-02-17 ENCOUNTER — Encounter (HOSPITAL_COMMUNITY): Payer: Self-pay | Admitting: Gastroenterology

## 2018-02-17 DIAGNOSIS — Z9079 Acquired absence of other genital organ(s): Secondary | ICD-10-CM

## 2018-02-17 DIAGNOSIS — Z9889 Other specified postprocedural states: Secondary | ICD-10-CM

## 2018-02-17 DIAGNOSIS — D72829 Elevated white blood cell count, unspecified: Secondary | ICD-10-CM | POA: Diagnosis present

## 2018-02-17 DIAGNOSIS — K250 Acute gastric ulcer with hemorrhage: Secondary | ICD-10-CM

## 2018-02-17 DIAGNOSIS — D131 Benign neoplasm of stomach: Secondary | ICD-10-CM

## 2018-02-17 DIAGNOSIS — R339 Retention of urine, unspecified: Secondary | ICD-10-CM

## 2018-02-17 DIAGNOSIS — K3189 Other diseases of stomach and duodenum: Secondary | ICD-10-CM | POA: Diagnosis present

## 2018-02-17 DIAGNOSIS — Z960 Presence of urogenital implants: Secondary | ICD-10-CM

## 2018-02-17 LAB — BASIC METABOLIC PANEL
ANION GAP: 7 (ref 5–15)
BUN: 32 mg/dL — ABNORMAL HIGH (ref 6–20)
CALCIUM: 7.9 mg/dL — AB (ref 8.9–10.3)
CO2: 22 mmol/L (ref 22–32)
Chloride: 114 mmol/L — ABNORMAL HIGH (ref 101–111)
Creatinine, Ser: 0.96 mg/dL (ref 0.61–1.24)
GFR calc Af Amer: >60 mL/min (ref >60)
Glucose, Bld: 116 mg/dL — ABNORMAL HIGH (ref 65–99)
POTASSIUM: 4.5 mmol/L (ref 3.5–5.1)
SODIUM: 143 mmol/L (ref 135–145)

## 2018-02-17 LAB — CBC
HEMATOCRIT: 27.9 % — AB (ref 39.0–52.0)
HEMOGLOBIN: 9.1 g/dL — AB (ref 13.0–17.0)
MCH: 30.1 pg (ref 26.0–34.0)
MCHC: 32.6 g/dL (ref 30.0–36.0)
MCV: 92.4 fL (ref 78.0–100.0)
Platelets: 163 10*3/uL (ref 150–400)
RBC: 3.02 MIL/uL — ABNORMAL LOW (ref 4.22–5.81)
RDW: 14.2 % (ref 11.5–15.5)
WBC: 18.8 10*3/uL — AB (ref 4.0–10.5)

## 2018-02-17 LAB — HEMOGLOBIN AND HEMATOCRIT, BLOOD
HCT: 25 % — ABNORMAL LOW (ref 39.0–52.0)
HEMATOCRIT: 29.5 % — AB (ref 39.0–52.0)
HEMOGLOBIN: 9.4 g/dL — AB (ref 13.0–17.0)
HEMOGLOBIN: 8.1 g/dL — AB (ref 13.0–17.0)

## 2018-02-17 MED ORDER — LIDOCAINE HCL 2 % EX GEL: 1.0000 "application " | Freq: Once | CUTANEOUS | Status: DC

## 2018-02-17 MED ORDER — GABAPENTIN 300 MG PO CAPS
300.0000 mg | ORAL_CAPSULE | Freq: Two times a day (BID) | ORAL | Status: DC
  Administered 2018-02-17 – 2018-02-20 (×5): 300 mg via ORAL
  Filled 2018-02-17 (×6): qty 1

## 2018-02-17 MED ORDER — LIDOCAINE HCL 2 % EX GEL
1.0000 | Freq: Once | CUTANEOUS | Status: DC
Start: 2018-02-17 — End: 2018-02-24
  Filled 2018-02-17: qty 5

## 2018-02-17 MED ORDER — PNEUMOCOCCAL VAC POLYVALENT 25 MCG/0.5ML IJ INJ
0.5000 mL | INJECTION | INTRAMUSCULAR | Status: DC
  Filled 2018-02-17: qty 0.5

## 2018-02-17 MED ORDER — IOPAMIDOL (ISOVUE-300) INJECTION 61%
INTRAVENOUS | Status: AC
  Filled 2018-02-17: qty 30

## 2018-02-17 MED ORDER — IOPAMIDOL (ISOVUE-300) INJECTION 61%
INTRAVENOUS | Status: AC
  Administered 2018-02-17: 100 mL
  Filled 2018-02-17: qty 100

## 2018-02-17 MED ORDER — EPINEPHRINE PF 1 MG/10ML IJ SOSY
PREFILLED_SYRINGE | INTRAMUSCULAR | Status: AC
  Filled 2018-02-17: qty 10

## 2018-02-17 MED ORDER — SALINE SPRAY 0.65 % NA SOLN
1.0000 | NASAL | Status: DC | PRN
  Administered 2018-02-17: 1 via NASAL
  Filled 2018-02-17: qty 44

## 2018-02-17 NOTE — Progress Notes (Addendum)
Subjective: The medical team met with Casey Gates and his son Casey Gates today to discuss the plan of care. Casey Gates reports that he has been feeling "knocked out" referring to low energy while being admitted. He also reports leg pain attributed to being in the hospital bed for an extended period of time. There was one event last night where ~700cc of urinary retention was found on bladder scan and some blood at the tip of the catheter when trying to advance the in/out catheter. The son reports that Casey Gates has had no problems with urinary retention in the past. Most recently, his net output was at -885 mL as of this morning. Discussed the plan for CT scan later today and continued monitoring of hemoglobin levels. Patient and son confirmed understanding.  Objective: Vital signs in last 24 hours: Vitals:   02/16/18 1751 02/16/18 2158 02/17/18 0435 02/17/18 1351  BP: (!) 126/50 134/63 125/64 126/60  Pulse: 94 94 74 80  Resp:  (!) '21 18 19  '$ Temp: (!) 100.5 F (38.1 C) 99.8 F (37.7 C) 98.3 F (36.8 C) 98.1 F (36.7 C)  TempSrc: Oral Oral Oral Oral  SpO2: (!) 88% 99% 100% 99%  Weight:      Height:       Weight change:   Intake/Output Summary (Last 24 hours) at 02/17/2018 1524 Last data filed at 02/17/2018 1352 Gross per 24 hour  Intake 500 ml  Output 1900 ml  Net -1400 ml   BP 126/60 (BP Location: Right Arm)   Pulse 80   Temp 98.1 F (36.7 C) (Oral)   Resp 19   Ht 6' (1.829 m)   Wt 150 lb (68 kg)   SpO2 99%   BMI 20.34 kg/m   General Appearance:    Elderly gentlemen lying in bed, tired, cooperative, no distress, appears stated age  Head:    Normocephalic, without obvious abnormality, atraumatic  Mouth:    Some redness on dorsal aspect of tongue and mucosa indicative of recent hematemesis episodes  Lungs:     Clear to auscultation bilaterally, respirations unlabored  Chest wall:    No tenderness or deformity  Heart:    Regular rate and rhythm, S1 and S2 normal, no murmur, rub   or  gallop  Abdomen:     Soft, non-tender, bowel sounds active all four quadrants,    no masses, no organomegaly  Extremities:   Extremities normal, atraumatic, dry skin and excoriations present bilaterally; no cyanosis or edema  Pulses:   2+ and symmetric all extremities  Skin:   Skin color normal, texture, turgor diminished, no rashes or lesions   Lab Results: Recent Results - 86/76/7209  Basic metabolic panel     Status: Abnormal   Collection Time: 02/17/18  4:15 AM  Result Value Ref Range   Sodium 143 135 - 145 mmol/L   Potassium 4.5 3.5 - 5.1 mmol/L   Chloride 114 (H) 101 - 111 mmol/L   CO2 22 22 - 32 mmol/L   Glucose, Bld 116 (H) 65 - 99 mg/dL   BUN 32 (H) 6 - 20 mg/dL   Creatinine, Ser 0.96 0.61 - 1.24 mg/dL   Calcium 7.9 (L) 8.9 - 10.3 mg/dL   GFR calc non Af Amer >60 >60 mL/min   GFR calc Af Amer >60 >60 mL/min    Comment: (NOTE) The eGFR has been calculated using the CKD EPI equation. This calculation has not been validated in all clinical situations. eGFR's persistently <60 mL/min  signify possible Chronic Kidney Disease.    Anion gap 7 5 - 15    Comment: Performed at [facility] [address], [place], [place] [address]  CBC     Status: Abnormal   Collection Time: 02/17/18  4:15 AM  Result Value Ref Range   WBC 18.8 (H) 4.0 - 10.5 K/uL   RBC 3.02 (L) 4.22 - 5.81 MIL/uL   Hemoglobin 9.1 (L) 13.0 - 17.0 g/dL   HCT 27.9 (L) 39.0 - 52.0 %   MCV 92.4 78.0 - 100.0 fL   MCH 30.1 26.0 - 34.0 pg   MCHC 32.6 30.0 - 36.0 g/dL   RDW 14.2 11.5 - 15.5 %   Platelets 163 150 - 400 K/uL    Comment: Performed at [facility], [place] [address] [place], [place] [address]  Hemoglobin and hematocrit, blood     Status: Abnormal   Collection Time: 02/17/18 11:05 AM  Result Value Ref Range   Hemoglobin 9.4 (L) 13.0 - 17.0 g/dL   HCT 29.5 (L) 39.0 - 52.0 %    Comment: Performed at [facility], [place] [address], [place], [place] [address]    Micro Results: No  results found for this or any previous visit (from the past 240 hour(s)). Studies/Results: No results found. Medications: I have reviewed the patient's current medications. Scheduled Meds: . iopamidol      . lidocaine  1 application Urethral Once  . [START ON 02/18/2018] pneumococcal 23 valent vaccine  0.5 mL Intramuscular Tomorrow-1000   Continuous Infusions: . sodium chloride 150 mL/hr at 02/17/18 1110  . erythromycin    . pantoprozole (PROTONIX) infusion 8 mg/hr (02/17/18 0904)   PRN Meds:.morphine injection Assessment/Plan: Principal Problem:   Acute GI bleeding Active Problems:   Chronic pain   Acute blood loss anemia   Gastric mass   Leukocytosis  Casey Gates is a 84 y.o. Male patient with pmhx of asthma and chronic pain who p/w melena x 2 days and a 7 point drop in Hgb. Patient was hypotensive on arrival and soon had another episode of hematemesis while in the ED. GI scheduled an urgent EGD, completed yesterday. Study showed mass at lesser curvature of the stomach and area of ulceration and pigmentation noted at the base.  #GI Bleed. S/p 2u pRBCs and IVF. Hgb stabilized at 9.4 (previously 7.7 on admission). Suspected bleed from area of ulceration and mass detected on EGD. Likely aggravated due to recent NSAID use (started on Aspirin ~2 months ago). Patient undergoing CT scan today to better assess structural components of mass and possible metastases. - GI following closely, recommendations much appreciated. - Clear liquid diet - Continue Protonix - Monitor H/H - CT results pending  #AKI. Likely due to pre-renal etiology in the setting of GI bleed. Episode overnight where catheter was difficult to insert and found to have blood upon removal. Coude catheter placed. Current BUN at 32, Cr at 0.96. S/p prostatectomy with hx of prostate cancer. No prior history of urinary retention.  - Coude catheter  - Continue IV fluids - Monitor I/Os - Repeat BMP to f/u on Cr and  BUN  #Leukocytosis. Elevated white count on admission at 17.2. On repeat, WBCs at 18.8. Lactic acid was elevated to 3.75 in the setting of hypoperfusion, but soon stabilized s/p IVF hydration and 2u pRBCs (currently 1.3). Mildly febrile yesterday at 100.5, but temperature has since stabilized. Will continue to monitor. Can check UA to r/o UTI  if continues to be febrile.  - Repeat CBC, continue to monitor  #Questionable TWI on EKG: No TWI inversion seen on repeat EKG this AM. Troponin negative and patient denies chest pain.   #Chronic pain: Patient has chronic back and joint pains for which he takes Oxymorphone at home.  - Morphine 1 mg q4 prn  Dispo: Ongoing admission, potentially 3-4 days or as needed per GI, results of CT, and hemoglobin monitoring.  This is a Careers information officer Note.  The care of the patient was discussed with Dr. Isac Sarna and the assessment and plan formulated with their assistance.  Please see their attached note for official documentation of the daily encounter.   LOS: 1 day   Toy Cookey, Medical Student 02/17/2018, 3:24 PM

## 2018-02-17 NOTE — Progress Notes (Signed)
[facility] Gastroenterology Progress Note  Casey Gates 84 y.o. 10/12/1926  CC:  GI bleed   Subjective: patient and examined at bedside. Family at bedside. Patient denies abdominal pain, nausea or vomiting. Denied further bleeding episodes.  ROS : positive for weakness and fatigue. Negative for chest pain.   Objective: Vital signs in last 24 hours: Vitals:   02/16/18 2158 02/17/18 0435  BP: 134/63 125/64  Pulse: 94 74  Resp: (!) 21 18  Temp: 99.8 F (37.7 C) 98.3 F (36.8 C)  SpO2: 99% 100%    Physical Exam:  Gen. Elderly appearing patient. Not in acute distress Heart. RRR,  Murmur noted Abdomen. Soft, nontender, nondistended, bowel sounds present. LE : no edema   Lab Results: Recent Labs    02/16/18 0650 02/16/18 0732 02/17/18 0415  NA 139 141 143  K 4.9 4.8 4.5  CL 109 107 114*  CO2 19*  --  22  GLUCOSE 134* 126* 116*  BUN 60* 58* 32*  CREATININE 1.21 1.10 0.96  CALCIUM 8.2*  --  7.9*   Recent Labs    02/16/18 0650  AST 28  ALT 16*  ALKPHOS 52  BILITOT 0.6  PROT 4.7*  ALBUMIN 2.7*   Recent Labs    02/16/18 0650  02/16/18 1952 02/17/18 0415  WBC 17.2*  --   --  18.8*  HGB 7.7*   < > 9.1* 9.1*  HCT 24.0*   < > 27.0* 27.9*  MCV 93.4  --   --  92.4  PLT 188  --   --  163   < > = values in this interval not displayed.   No results for input(s): LABPROT, INR in the last 72 hours.    Assessment/Plan: - upper GI bleed with EGD yesterday showing large submucosal lesion towards lesser curvature of the stomach with the  area of ulceration and pigmentation.3 mL of epinephrine was injected.2 clips were placed. - Acute blood loss anemia - leukocytosis  Recommendations ------------------------- - awaiting CT abdomen pelvis with IV contrast.patient's creatinine is 0.96 today. BUN/creatinine trending down. Hemoglobin stable. - no need to repeat CT chest as patient had a CT chest on 12/30/2017. - continue Protonix drip for now.monitor H&H. - May need EUS  with possible FNA depending on CT scan results. - This was discussed with the family. - GI will follow  Otis Brace MD, [place] 02/17/2018, 10:09 AM  Contact #  [phone]

## 2018-02-17 NOTE — Progress Notes (Signed)
Notified MD on call regarding bladder scan showing 715 ml of urine in bladder. Received order for in and out catheter. Attempted catheterization with Will Bonnet, RN assisting. Unable to advance catheter into bladder due to resistance and pain for patient. When catheter was withdrawn, a small amount of blood was on the tip. Notified MD, will continue to monitor.

## 2018-02-17 NOTE — Progress Notes (Signed)
   Subjective:  Patient developed urinary retention overnight and Coude catheter placed after bladder scan revealed 700cc in the bladder. This has never happened before in the past. Patient states he is "hanging in there." Son present at bedside. States GI provider stopped by this AM and discussed results from EGD as well as further workup including CT abdomen/pelvis and possible US guided EGD for location of the mass. His son reports he was recently evaluated for chest pain by Dr. Scarlette Calico Patient and family verbalized understanding and are in agreement with plan.   Objective:  Vital signs in last 24 hours: Vitals:   02/16/18 1751 02/16/18 2158 02/17/18 0435 02/17/18 1351  BP: (!) 126/50 134/63 125/64 126/60  Pulse: 94 94 74 80  Resp:  (!) 21 18 19   Temp: (!) 100.5 F (38.1 C) 99.8 F (37.7 C) 98.3 F (36.8 C) 98.1 F (36.7 C)  TempSrc: Oral Oral Oral Oral  SpO2: (!) 88% 99% 100% 99%  Weight:      Height:       Physical Exam  Constitutional: He is oriented to person, place, and time.  Elderly male, tired-appearing lying in bed in no acute distress   Cardiovascular: Normal rate, regular rhythm and normal heart sounds. Exam reveals no gallop and no friction rub.  No murmur heard. Pulmonary/Chest: Effort normal and breath sounds normal. No respiratory distress. He has no wheezes. He has no rales.  Abdominal: Soft. Bowel sounds are normal. He exhibits no distension. There is tenderness (tenderness to palpation over lower quadrants ).  Musculoskeletal: He exhibits no edema.  Neurological: He is alert and oriented to person, place, and time.    Assessment/Plan:  Principal Problem:   Acute GI bleeding Active Problems:   Chronic pain   Acute blood loss anemia   Gastric mass   Leukocytosis  # GI bleed: Patient presented with 2-day history of multiple episodes of melena and found to have a gastric mass and lesser curvature on EGD associated with ulceration concerning for  malignancy. GI recommended CT abdomen/pelvis for localization of mass. He is currently hemodynamically stable and Hgb stable at 9.4 after 2 units of pRBCs. Will continue workup and management as below. - GI following, appreciate assistance and recommendations  - Follow up CT abdomen/pelvis  - Cardiac monitoring  - Continue Protonix gtt  - NS @ 150 cc/hr  - Will stop home aspirin on discharge   # Acute kidney injury: Resolved  # Acute urinary retention: Hx of prostate cancer s/p prostatectomy. No prior history of retention. Renal function stable. Suspect medication side effect. Will leave Coude catheter in place today and consider trial of void tomorrow.  - Coude catheter  - Continue IV fluid hydration - Continue to monitor I/O  # Leukocytosis: White count trending up, 18.8 today. This is in the setting of GI bleed. Low suspicion for infectious etiology at this time but will monitor for signs/symptoms of infection given likely abdominal malignancy and could be immunocompromised.  - Will continue to monitor   # Questionable TWI on EKG: No TWI inversion seen on repeat EKG this AM. Troponin negative and patient denies chest pain.   # Chronic pain: Patient has chronic back and joint pains for which he takes Oxymorphone at home.  - Morphine 1 mg q4 prn   Dispo: Anticipated discharge in approximately 2-4 day(s).   Welford Roche, MD 02/17/2018, 3:05 PM Pager: [phone]

## 2018-02-17 NOTE — Anesthesia Postprocedure Evaluation (Signed)
Anesthesia Post Note  Patient: Casey Gates  Procedure(s) Performed: ESOPHAGOGASTRODUODENOSCOPY (EGD) WITH PROPOFOL (Left )     Patient location during evaluation: Endoscopy Anesthesia Type: General Level of consciousness: awake and alert Pain management: pain level controlled Vital Signs Assessment: post-procedure vital signs reviewed and stable Respiratory status: spontaneous breathing, nonlabored ventilation and respiratory function stable Cardiovascular status: blood pressure returned to baseline and stable Postop Assessment: no apparent nausea or vomiting Anesthetic complications: no    Last Vitals:  Vitals:   02/16/18 2158 02/17/18 0435  BP: 134/63 125/64  Pulse: 94 74  Resp: (!) 21 18  Temp: 37.7 C 36.8 C  SpO2: 99% 100%    Last Pain:  Vitals:   02/17/18 0435  TempSrc: Oral                 Catalina Gravel

## 2018-02-18 ENCOUNTER — Inpatient Hospital Stay (HOSPITAL_COMMUNITY): Payer: Medicare Other

## 2018-02-18 ENCOUNTER — Encounter (HOSPITAL_COMMUNITY): Payer: Self-pay | Admitting: Physician Assistant

## 2018-02-18 LAB — BASIC METABOLIC PANEL
Anion gap: 4 — ABNORMAL LOW (ref 5–15)
BUN: 14 mg/dL (ref 6–20)
CALCIUM: 7.6 mg/dL — AB (ref 8.9–10.3)
CO2: 21 mmol/L — AB (ref 22–32)
CREATININE: 0.79 mg/dL (ref 0.61–1.24)
Chloride: 116 mmol/L — ABNORMAL HIGH (ref 101–111)
GFR calc Af Amer: >60 mL/min (ref >60)
GFR calc non Af Amer: >60 mL/min (ref >60)
GLUCOSE: 90 mg/dL (ref 65–99)
Potassium: 3.6 mmol/L (ref 3.5–5.1)
Sodium: 141 mmol/L (ref 135–145)

## 2018-02-18 LAB — CBC
HEMATOCRIT: 25.3 % — AB (ref 39.0–52.0)
Hemoglobin: 8.1 g/dL — ABNORMAL LOW (ref 13.0–17.0)
MCH: 30 pg (ref 26.0–34.0)
MCHC: 32 g/dL (ref 30.0–36.0)
MCV: 93.7 fL (ref 78.0–100.0)
Platelets: 177 10*3/uL (ref 150–400)
RBC: 2.7 MIL/uL — ABNORMAL LOW (ref 4.22–5.81)
RDW: 14.6 % (ref 11.5–15.5)
WBC: 15 10*3/uL — ABNORMAL HIGH (ref 4.0–10.5)

## 2018-02-18 LAB — PREPARE RBC (CROSSMATCH)

## 2018-02-18 LAB — HEMOGLOBIN AND HEMATOCRIT, BLOOD
HCT: 23.6 % — ABNORMAL LOW (ref 39.0–52.0)
HCT: 22.8 % — ABNORMAL LOW (ref 39.0–52.0)
HEMATOCRIT: 29 % — AB (ref 39.0–52.0)
HEMOGLOBIN: 7.4 g/dL — AB (ref 13.0–17.0)
HEMOGLOBIN: 9.3 g/dL — AB (ref 13.0–17.0)
Hemoglobin: 7.6 g/dL — ABNORMAL LOW (ref 13.0–17.0)

## 2018-02-18 MED ORDER — SODIUM CHLORIDE 0.9 % IV SOLN
Freq: Once | INTRAVENOUS | Status: AC
  Administered 2018-02-18: 16:00:00 via INTRAVENOUS

## 2018-02-18 NOTE — Progress Notes (Signed)
Have reviewed Endo report and photographs and yesterday's CT rept.  Have discussed case w/ Dr. Toni Amend Hoss of IR.  We both feel that percutaneous CT-directed bx is the best way to go in terms of getting a tissue dx (since lesion is submucosal, endoscopic bx's would probably be unrevealing; EUS would only allow FNA, not core bx's).  Discussed w/ pt and he understands and agrees.  IR has been consulted and hopes to do bx tomorrow.  Dr. Paulita Fujita will be covering our service tomorrow.  Cleotis Nipper, M.D. Pager [phone] If no answer or after 5 PM call [phone]

## 2018-02-18 NOTE — Progress Notes (Signed)
Subjective: The medical team met with Mr. Roots and his son today to discuss the plan of care. Patient reports that he had multiple bouts of vomiting over a 5 minute period of time during the night. Denies any hematemesis. Otherwise, reports that he is feeling generally improved from yesterday. Also endorses some leg pain that has also improved with the gabapentin. Still has significant bilateral foot pain.  Objective: Vital signs in last 24 hours: Vitals:   02/17/18 2031 02/18/18 0515 02/18/18 1431 02/18/18 1550  BP: (!) 120/47 (!) 106/47 (!) 91/45 (!) 106/46  Pulse: 73 93 69 66  Resp: 18 18 19 (!) 24  Temp: 98.8 F (37.1 C) 98 F (36.7 C) 98.4 F (36.9 C) 98.3 F (36.8 C)  TempSrc: Oral Oral Oral Oral  SpO2: 96% (!) 78% (!) 87% 100%  Weight:      Height:       Weight change:   Intake/Output Summary (Last 24 hours) at 02/18/2018 1618 Last data filed at 02/18/2018 1602 Gross per 24 hour  Intake 3215 ml  Output 800 ml  Net 2415 ml   BP (!) 100/48   Pulse 68   Temp 98.4 F (36.9 C) (Oral)   Resp 20   Ht 6' (1.829 m)   Wt 150 lb (68 kg)   SpO2 99%   BMI 20.34 kg/m   General Appearance:    Tired-appearing, lying in bed with intermittent leg restlessness. Cooperative, appears stated age  Head:    Normocephalic, without obvious abnormality, atraumatic  Lungs:     Clear to auscultation bilaterally, respirations unlabored  Chest wall:    No tenderness or deformity  Heart:    Regular rate and rhythm, S1 and S2 normal, no murmur, rub   or gallop  Abdomen:     Soft, slightly tender to palpation of upper quadrants, bowel sounds active all four quadrants, no masses, no organomegaly  Extremities:   Extremities normal, atraumatic, no cyanosis or edema  Pulses:   2+ and symmetric all extremities  Skin:   Skin color, texture, turgor normal, no rashes or lesions   Lab Results: Recent Results  CBC     Status: Abnormal   Collection Time: 02/18/18  4:36 AM  Result Value Ref Range   WBC 15.0 (H) 4.0 - 10.5 K/uL   RBC 2.70 (L) 4.22 - 5.81 MIL/uL   Hemoglobin 8.1 (L) 13.0 - 17.0 g/dL   HCT 25.3 (L) 39.0 - 52.0 %   MCV 93.7 78.0 - 100.0 fL   MCH 30.0 26.0 - 34.0 pg   MCHC 32.0 30.0 - 36.0 g/dL   RDW 14.6 11.5 - 15.5 %   Platelets 177 150 - 400 K/uL    Comment: Performed at [facility], [address] [place], [place] [address]  Basic metabolic panel     Status: Abnormal   Collection Time: 02/18/18  4:36 AM  Result Value Ref Range   Sodium 141 135 - 145 mmol/L   Potassium 3.6 3.5 - 5.1 mmol/L    Comment: DELTA CHECK NOTED   Chloride 116 (H) 101 - 111 mmol/L   CO2 21 (L) 22 - 32 mmol/L   Glucose, Bld 90 65 - 99 mg/dL   BUN 14 6 - 20 mg/dL   Creatinine, Ser 0.79 0.61 - 1.24 mg/dL   Calcium 7.6 (L) 8.9 - 10.3 mg/dL   GFR calc non Af Amer >60 >60 mL/min   GFR calc Af Amer >60 >60 mL/min      Comment: (NOTE) The eGFR has been calculated using the CKD EPI equation. This calculation has not been validated in all clinical situations. eGFR's persistently <60 mL/min signify possible Chronic Kidney Disease.    Anion gap 4 (L) 5 - 15    Comment: Performed at [facility], [address] [place], [place] [address]  Hemoglobin and hematocrit, blood     Status: Abnormal   Collection Time: 02/18/18 11:03 AM  Result Value Ref Range   Hemoglobin 7.6 (L) 13.0 - 17.0 g/dL   HCT 23.6 (L) 39.0 - 52.0 %    Comment: Performed at [facility], [address] [place], [place] [address]  Hemoglobin and hematocrit, blood     Status: Abnormal   Collection Time: 02/18/18  3:17 PM  Result Value Ref Range   Hemoglobin 7.4 (L) 13.0 - 17.0 g/dL   HCT 22.8 (L) 39.0 - 52.0 %    Comment: Performed at [facility], [address] [place], [place] [address]  Prepare RBC     Status: None   Collection Time: 02/18/18  3:25 PM  Result Value Ref Range   Order Confirmation      ORDER PROCESSED BY BLOOD BANK Performed at [facility], [address]  [place], [place] [address]     Micro Results: No results found for this or any previous visit (from the past 240 hour(s)). Studies/Results: Ct Abdomen Pelvis W Contrast  Result Date: 02/17/2018 CLINICAL DATA:  89-year-old male with history of submucosal ulcerated gastric mass noted on EGD along the lesser curvature of the stomach. EXAM: CT ABDOMEN AND PELVIS WITH CONTRAST TECHNIQUE: Multidetector CT imaging of the abdomen and pelvis was performed using the standard protocol following bolus administration of intravenous contrast. CONTRAST:  100 mL ISOVUE-300 IOPAMIDOL (ISOVUE-300) INJECTION 61% COMPARISON:  None. FINDINGS: Lower chest: Areas of cylindrical bronchiectasis, scarring and volume loss in the lower lobes of the lungs bilaterally. Atherosclerosis in the left main, left anterior descending, left circumflex and right coronary arteries. Calcifications of the aortic valve. Hepatobiliary: Gastric mass (discussed below) comes in close proximity to the undersurface of the left lobe of the liver, without definitive evidence of direct invasion. Liver has a slightly shrunken appearance and nodular contour, which could suggest mild cirrhosis. No discrete cystic or solid hepatic lesions. No intra or extrahepatic biliary ductal dilatation. Status post cholecystectomy. Pancreas: No pancreatic mass. No pancreatic ductal dilatation. No pancreatic or peripancreatic fluid or inflammatory changes. Spleen: Calcified granuloma in the spleen. Adrenals/Urinary Tract: Right kidney is either surgically or congenitally absent, or atrophic. 4.7 x 6.8 x 4.0 cm low-attenuation lesion in the lower pole of the left kidney is compatible with a large simple cyst. No hydroureteronephrosis. Urinary bladder is nearly completely decompressed with an indwelling Foley balloon catheter in place. 2.0 x 1.4 cm left adrenal nodule is incompletely characterized. Right adrenal gland is normal in appearance. Stomach/Bowel: Along the lesser curvature  of the distal stomach there is a large heterogeneously enhancing partially calcified gastric mass measuring 9.7 x 8.8 x 10.9 cm (axial image 34 of series 3 and coronal image 39 of series 6). Surgical clip extending into the gastric lumen from the left lateral margin of the mass, suggesting recent endoscopic biopsy. No pathologic dilatation of small bowel or colon. Normal appendix. Vascular/Lymphatic: Aortic atherosclerosis, without evidence of aneurysm or dissection in the abdominal or pelvic vasculature. No lymphadenopathy noted in the abdomen or pelvis. Reproductive: Prostate gland and seminal vesicles are   unremarkable in appearance. Other: No significant volume of ascites.  No pneumoperitoneum. Musculoskeletal: Severe levoscoliosis of the mid lumbar spine. There are no aggressive appearing lytic or blastic lesions noted in the visualized portions of the skeleton. IMPRESSION: 1. 9.7 x 8.8 x 10.9 cm aggressive appearing heterogeneously enhancing mass along the lesser curvature of the stomach, highly concerning for primary gastric neoplasm. This is intimately associated with the undersurface of the left lobe of the liver without definitive evidence of direct invasion (although superficial involvement of the hepatic capsule is suspected given the loss of the intervening fat plane). There is also an indeterminate left adrenal nodule measuring 2.0 x 1.4 cm, which could be metastatic. No other definite signs of metastatic disease elsewhere in the abdomen or pelvis. 2. Areas of cylindrical bronchiectasis and chronic scarring in the lower lobes of the lungs bilaterally. 3. Aortic atherosclerosis, in addition to left main and 3 vessel coronary artery disease. 4. Right kidney is either surgically or congenitally absent, or severely atrophic. 5. Additional incidental findings, as above. Electronically Signed   By: Daniel  Entrikin M.D.   On: 02/17/2018 16:03   Medications: I have reviewed the patient's current  medications. Scheduled Meds: . gabapentin  300 mg Oral BID  . lidocaine  1 application Urethral Once   Continuous Infusions: . sodium chloride 150 mL/hr at 02/18/18 1340  . pantoprozole (PROTONIX) infusion 8 mg/hr (02/18/18 0900)   PRN Meds:.morphine injection, sodium chloride Assessment/Plan: Principal Problem:   Acute GI bleeding Active Problems:   Chronic pain   Acute blood loss anemia   Gastric mass   Leukocytosis  Mr. Ayala is an 84 y.o. Male with past medical history of asthma and chronic pain who presented with melena x 2 and a significant hemoglobin drop. Recent CT suggested gastric mass to be evaluated by IR on 03/21.   #GI Bleed. CT abdomen found a gastric mass at the lesser curvature of the stomach with concern for malignancy. Hgb this morning was found to be stable at 8.1. Later in the day, there was a drop to 7.4. Patient is grossly asymptomatic. Will consider transfusing 1 pRBCs following repeat H/H. GI following. Plan to obtain CT-guided biopsy via IR on 03/21.  - GI following, recommendations appreciated - Percutaneous CT-guided biopsy of gastric mass on 03/21 - Continue NS 150 cc/hr - Continue Pantoprazole   #Acute Kidney Injury - resolved.  #Acute urinary retention. Hx of prostate cancer s/p TURP. No definite indication on radiology report whether prostate is present on CT abd/pelvis. No history of retention. Renal fxn stable. S/p Coude catheter placement. - Coude catheter - Continue IV fluid hydration - Continue to monitor I/O  #Leukocytosis. WBCs trending down in the setting of GI bleed. No s/s of infection. Most recent WBC found to be  15.0 (down from 18.8).  - Continue to monitor  #Chronic Pain. Patient takes home Oxymorphone d/t chronic back and joint pains.  - Morphine 1 mg q4 hrs prn  Dispo: Anticipated d/c in approximately 2-4 days  This is a Medical Student Note.  The care of the patient was discussed with Dr. Santos-Sanchez and the assessment and  plan formulated with their assistance.  Please see their attached note for official documentation of the daily encounter.   LOS: 2 days   ,  N, Medical Student 02/18/2018, 4:18 PM  

## 2018-02-18 NOTE — Progress Notes (Signed)
   Subjective:  No acute events overnight. Patient reports ongoing, intermittent abdominal pain and reports several episodes of emesis overnight (not documented). Denies BMs. Son present at bedside. Discussed concern for gastric malignancy, but will need further testing including biopsy to confirm diagnosis. Discussed we will await further GI recommendations today for further management. Patient and family voiced understanding and are in agreement with plan. All questions answered.   Objective:  Vital signs in last 24 hours: Vitals:   02/17/18 0435 02/17/18 1351 02/17/18 2031 02/18/18 0515  BP: 125/64 126/60 (!) 120/47 (!) 106/47  Pulse: 74 80 73 93  Resp: 18 19 18 18   Temp: 98.3 F (36.8 C) 98.1 F (36.7 C) 98.8 F (37.1 C) 98 F (36.7 C)  TempSrc: Oral Oral Oral Oral  SpO2: 100% 99% 96% (!) 78%  Weight:      Height:       Physical Exam  Constitutional: He is oriented to person, place, and time.  Elderly male, tired-appearing, lying in bed in no acute distress   Cardiovascular: Normal rate, regular rhythm and normal heart sounds. Exam reveals no gallop and no friction rub.  No murmur heard. Pulmonary/Chest: Effort normal. No respiratory distress.  Abdominal: Soft. He exhibits no distension.  Musculoskeletal: He exhibits no edema.  Neurological: He is alert and oriented to person, place, and time.    Assessment/Plan:  Principal Problem:   Acute GI bleeding Active Problems:   Chronic pain   Acute blood loss anemia   Gastric mass   Leukocytosis  # GI bleed: Patient presented with 2-day history of multiple episodes of melena and found to have a gastric mass and lesser curvature on EGD associated with ulceration concerning for malignancy. CT abdomen/pelvis showed gastric mass. He is currently hemodynamically stable. Hgb trending down at 8.1 today. Will continue workup and management as below. - GI following, appreciate assistance and recommendations. Plan for percutaneous  CT-directed biopsy of gastric mass, hopefully tomorrow.  - Continue Protonix gtt  - NS @ 150 cc/hr  - Will stop home aspirin on discharge   # Acute kidney injury: Resolved  # Acute urinary retention:  Hx of prostate cancer s/p TURP. However, prostate present on CT abd/pelvis per radiology report. No prior history of retention. Renal function stable and normal. Suspect medication side effect. Will leave Coude catheter in place.  - Coude catheter  - Continue IV fluid hydration - Continue to monitor I/O  # Leukocytosis: This is in the setting of GI bleed. WBC trending down. Low suspicion for infectious etiology at this time but will monitor for signs/symptoms of infection given likely abdominal malignancy and could be immunocompromised.  - Will continue to monitor   # Chronic pain: Patient has chronic back and joint pains for which he takes Oxymorphone at home.  - Morphine 1 mg q4 prn   Dispo: Anticipated discharge in approximately 2-4 day(s).   Welford Roche, MD 02/18/2018, 10:44 AM Pager: [phone]

## 2018-02-18 NOTE — H&P (Signed)
Chief Complaint: Stomach mass  Referring Physician(s): [name]  Supervising Physician: [name]  Patient Status: [facility]  History of Present Illness: [facility] is a 84 y.o. male who is a resident [facility].  He called EMS after having multiple episodes of black tarry stools overnight.  He states he was in his usual state of health until the night of 3/18 when he noticed black stools associated with dizziness and weakness.   He was found to be hypotensive with a blood pressure of 70/40and has had multiple episodes of melanotic stool while in the ER.  Patient takes a baby aspirin and denies additional NSAIDs use.  He denies prior episodes of black stool or bloody bowel movement. He also denies abdominal pain, nausea, vomiting, recent change in weight, difficulty swallowing or pain on swallowing.  Endoscopy done 3/18 by Dr. [name] = Coffee-ground colored fluid noted in esophagus, normal underlying mucosa on lavage. Large amount of dark red, coffee-ground colored as well as clotted blood, found in the gastric body obscuring complete visualization of cardia and fundus. A large submucosal partially circumferential involving one third of the luminal circumference, mass was noted in the lesser curvature of the stomach. There was an area of ulceration and pigmentation noted at the base. This ulcerated area was injected with 3 mL of 1 in 10,000 epinephrine and placement of 2 endoclips. Duodenal bulb and rest of the duodenum appeared unremarkable.  CT scan done 3/19 = IMPRESSION: 9.7 x 8.8 x 10.9 cm aggressive appearing heterogeneously enhancing mass along the lesser curvature of the stomach, highly concerning for primary gastric neoplasm. This is intimately associated with the undersurface of the left lobe of the liver without definitive evidence of direct invasion (although superficial involvement of the hepatic capsule is suspected given  the loss of the intervening fat plane). There is also an indeterminate left adrenal nodule measuring 2.0 x 1.4 cm, which could be metastatic. No other definite signs of metastatic disease elsewhere in the abdomen or pelvis.  We are asked to evaluate for image guided biopsy of the gastric mass.  Past Medical History:  Diagnosis Date  . Asthma    AS A CHILD  . Blind left eye   . Cancer [facility])    PROSTATE CANCER  . Cellulitis    frequently, "all over"  . Chronic kidney disease   . Chronic pain syndrome   . Constipation   . DDD (degenerative disc disease), lumbar   . GI bleeding   . Insomnia   . Kyphoscoliosis   . Mass in the abdomen 01/2018  . Restless leg     Past Surgical History:  Procedure Laterality Date  . CHOLECYSTECTOMY    . COLONOSCOPY    . ESOPHAGOGASTRODUODENOSCOPY (EGD) WITH PROPOFOL Left 02/16/2018   Procedure: ESOPHAGOGASTRODUODENOSCOPY (EGD) WITH PROPOFOL;  Surgeon: [name], MD;  [facility]: [facility];  Service: Gastroenterology;  Laterality: Left;  . PROSTATECTOMY    . QUADRICEPS TENDON REPAIR Left 07/07/2017   Procedure: REPAIR QUADRICEP TENDON;  Surgeon: Rod Can, MD;  Location: [facility];  Service: Orthopedics;  Laterality: Left;  . RIGHT CATARACT EXTRACTION  2013  . RIGTH NEPHRECTOMY  2006  . TONSILLECTOMY    . TRANSURETHRAL RESECTION OF PROSTATE  1997    Allergies: Peanuts [peanut oil]; Coconut flavor [flavoring agent]; and Coconut oil  Medications: Prior to Admission medications   Medication Sig Start Date End Date Taking? Authorizing Provider  aspirin EC 81 MG tablet Take 1  tablet (81 mg total) by mouth daily. 01/20/18  Yes [name], MD  gabapentin (NEURONTIN) 300 MG capsule Take 300 mg by mouth 2 (two) times daily. (0900 & 2100)   Yes [provider]  mirtazapine (REMERON) 7.5 MG tablet Take 7.5 mg by mouth at bedtime.   Yes [provider]  MOVANTIK 25 MG TABS tablet Take 25 mg by mouth daily. 01/07/18  Yes  [provider]  oxymorphone (OPANA) 10 MG tablet Take 10 mg by mouth every 6 (six) hours as needed for pain.    Yes [provider]  polyethylene glycol (MIRALAX / GLYCOLAX) packet Take 17 g by mouth 2 (two) times daily. 08/28/16  Yes [name], MD     Family History  Problem Relation Age of Onset  . Bone cancer Mother   . Hypertension Mother   . Arthritis Mother     Social History   Socioeconomic History  . Marital status: Widowed    Spouse name: None  . Number of children: None  . Years of education: None  . Highest education level: None  Social Needs  . Financial resource strain: None  . Food insecurity - worry: None  . Food insecurity - inability: None  . Transportation needs - medical: None  . Transportation needs - non-medical: None  Occupational History  . None  Tobacco Use  . Smoking status: Never Smoker  . Smokeless tobacco: Never Used  Substance and Sexual Activity  . Alcohol use: No    Alcohol/week: 0.0 oz    Comment: quit 1985, 2 fifths/ week,S/P rehab  . Drug use: No  . Sexual activity: No  Other Topics Concern  . None  Social History Narrative   Caffeine 2-3 cups average.  Widowed,  [facility]ives [facility].  Retired Forensic psychologist.  One son.   Review of Systems: A 12 point ROS discussed and pertinent positives are indicated in the HPI above.  All other systems are negative. Review of Systems  Vital Signs: BP (!) 106/47 (BP Location: Left Arm)   Pulse 93   Temp 98 F (36.7 C) (Oral)   Resp 18   Ht 6' (1.829 m)   Wt 150 lb (68 kg)   SpO2 (!) 78%   BMI 20.34 kg/m   Physical Exam  Constitutional: He is oriented to person, place, and time. He appears well-developed.  HENT:  Head: Normocephalic and atraumatic.  Eyes: EOM are normal.  Neck: Normal range of motion.  Cardiovascular: Normal rate, regular rhythm and normal heart sounds.  Pulmonary/Chest: Effort normal and breath sounds normal. No stridor. No  respiratory distress.  Abdominal: Soft. There is no tenderness.  Musculoskeletal: Normal range of motion.  Neurological: He is alert and oriented to person, place, and time.  Skin: Skin is warm and dry.  Psychiatric: He has a normal mood and affect. His behavior is normal. Judgment and thought content normal.  Vitals reviewed.   Imaging: Ct Abdomen Pelvis W Contrast  Result Date: 02/17/2018 CLINICAL DATA:  84 year old male with history of submucosal ulcerated gastric mass noted on EGD along the lesser curvature of the stomach. EXAM: CT ABDOMEN AND PELVIS WITH CONTRAST TECHNIQUE: Multidetector CT imaging of the abdomen and pelvis was performed using the standard protocol following bolus administration of intravenous contrast. CONTRAST:  100 mL ISOVUE-300 IOPAMIDOL (ISOVUE-300) INJECTION 61% COMPARISON:  None. FINDINGS: Lower chest: Areas of cylindrical bronchiectasis, scarring and volume loss in the lower lobes of the lungs bilaterally. Atherosclerosis in  the left main, left anterior descending, left circumflex and right coronary arteries. Calcifications of the aortic valve. Hepatobiliary: Gastric mass (discussed below) comes in close proximity to the undersurface of the left lobe of the liver, without definitive evidence of direct invasion. Liver has a slightly shrunken appearance and nodular contour, which could suggest mild cirrhosis. No discrete cystic or solid hepatic lesions. No intra or extrahepatic biliary ductal dilatation. Status post cholecystectomy. Pancreas: No pancreatic mass. No pancreatic ductal dilatation. No pancreatic or peripancreatic fluid or inflammatory changes. Spleen: Calcified granuloma in the spleen. Adrenals/Urinary Tract: Right kidney is either surgically or congenitally absent, or atrophic. 4.7 x 6.8 x 4.0 cm low-attenuation lesion in the lower pole of the left kidney is compatible with a large simple cyst. No hydroureteronephrosis. Urinary bladder is nearly completely  decompressed with an indwelling Foley balloon catheter in place. 2.0 x 1.4 cm left adrenal nodule is incompletely characterized. Right adrenal gland is normal in appearance. Stomach/Bowel: Along the lesser curvature of the distal stomach there is a large heterogeneously enhancing partially calcified gastric mass measuring 9.7 x 8.8 x 10.9 cm (axial image 34 of series 3 and coronal image 39 of series 6). Surgical clip extending into the gastric lumen from the left lateral margin of the mass, suggesting recent endoscopic biopsy. No pathologic dilatation of small bowel or colon. Normal appendix. Vascular/Lymphatic: Aortic atherosclerosis, without evidence of aneurysm or dissection in the abdominal or pelvic vasculature. No lymphadenopathy noted in the abdomen or pelvis. Reproductive: Prostate gland and seminal vesicles are unremarkable in appearance. Other: No significant volume of ascites.  No pneumoperitoneum. Musculoskeletal: Severe levoscoliosis of the mid lumbar spine. There are no aggressive appearing lytic or blastic lesions noted in the visualized portions of the skeleton. IMPRESSION: 1. 9.7 x 8.8 x 10.9 cm aggressive appearing heterogeneously enhancing mass along the lesser curvature of the stomach, highly concerning for primary gastric neoplasm. This is intimately associated with the undersurface of the left lobe of the liver without definitive evidence of direct invasion (although superficial involvement of the hepatic capsule is suspected given the loss of the intervening fat plane). There is also an indeterminate left adrenal nodule measuring 2.0 x 1.4 cm, which could be metastatic. No other definite signs of metastatic disease elsewhere in the abdomen or pelvis. 2. Areas of cylindrical bronchiectasis and chronic scarring in the lower lobes of the lungs bilaterally. 3. Aortic atherosclerosis, in addition to left main and 3 vessel coronary artery disease. 4. Right kidney is either surgically or congenitally  absent, or severely atrophic. 5. Additional incidental findings, as above. Electronically Signed   By: Vinnie Langton M.D.   On: 02/17/2018 16:03    Labs:  CBC: Recent Labs    12/31/17 0802 02/16/18 0650  02/17/18 0415 02/17/18 1105 02/17/18 2023 02/18/18 0436 02/18/18 1103  WBC 7.0 17.2*  --  18.8*  --   --  15.0*  --   HGB 14.9 7.7*   < > 9.1* 9.4* 8.1* 8.1* 7.6*  HCT 45.4 24.0*   < > 27.9* 29.5* 25.0* 25.3* 23.6*  PLT 163 188  --  163  --   --  177  --    < > = values in this interval not displayed.    COAGS: Recent Labs    05/24/17 1838  INR 1.06    BMP: Recent Labs    12/31/17 0802 02/16/18 0650 02/16/18 0732 02/17/18 0415 02/18/18 0436  NA 138 139 141 143 141  K 4.4 4.9 4.8 4.5  3.6  CL 106 109 107 114* 116*  CO2 23 19*  --  22 21*  GLUCOSE 90 134* 126* 116* 90  BUN 16 60* 58* 32* 14  CALCIUM 8.9 8.2*  --  7.9* 7.6*  CREATININE 0.81 1.21 1.10 0.96 0.79  GFRNONAA >60 51*  --  >60 >60  GFRAA >60 59*  --  >60 >60    LIVER FUNCTION TESTS: Recent Labs    05/24/17 1631 12/31/17 0802 02/16/18 0650  BILITOT 0.9 0.9 0.6  AST 31 29 28   ALT 34 17 16*  ALKPHOS 85 73 52  PROT 6.5 6.1* 4.7*  ALBUMIN 3.7 3.5 2.7*    TUMOR MARKERS: No results for input(s): AFPTM, CEA, CA199, CHROMGRNA in the last 8760 hours.  Assessment and Plan:  Gastric mass  Images reviewed by Dr. Barbie Banner.  Will proceed with CT guided biopsy tomorrow.  Risks and benefits discussed with the patient including, but not limited to bleeding, infection, damage to adjacent structures or low yield requiring additional tests.  All of the patient's questions were answered, patient is agreeable to proceed. Consent signed and in chart.  Thank you for this interesting consult.  I greatly enjoyed meeting Chord Takahashi and look forward to participating in their care.  A copy of this report was sent to the requesting provider on this date.  Electronically Signed: Murrell Redden, PA-C 02/18/2018,  1:31 PM   I spent a total of 40 Minutes in face to face in clinical consultation, greater than 50% of which was counseling/coordinating care for CT guided biopsy.

## 2018-02-18 NOTE — Progress Notes (Signed)
Pt's BP is 91/45 MD Frederico Hamman made aware. Will continue to assess.

## 2018-02-19 ENCOUNTER — Other Ambulatory Visit (HOSPITAL_COMMUNITY): Payer: Medicare Other

## 2018-02-19 ENCOUNTER — Inpatient Hospital Stay (HOSPITAL_COMMUNITY): Payer: Medicare Other

## 2018-02-19 DIAGNOSIS — M549 Dorsalgia, unspecified: Secondary | ICD-10-CM

## 2018-02-19 DIAGNOSIS — M2550 Pain in unspecified joint: Secondary | ICD-10-CM

## 2018-02-19 DIAGNOSIS — K3189 Other diseases of stomach and duodenum: Secondary | ICD-10-CM

## 2018-02-19 LAB — TYPE AND SCREEN
ABO/RH(D): A POS
Antibody Screen: NEGATIVE
UNIT DIVISION: 0
UNIT DIVISION: 0
UNIT DIVISION: 0

## 2018-02-19 LAB — BPAM RBC
BLOOD PRODUCT EXPIRATION DATE: 201903282359
Blood Product Expiration Date: 201903252359
Blood Product Expiration Date: 201903262359
ISSUE DATE / TIME: 201903180855
ISSUE DATE / TIME: 201903181053
ISSUE DATE / TIME: 201903201556
UNIT TYPE AND RH: 6200
Unit Type and Rh: 6200
Unit Type and Rh: 6200

## 2018-02-19 LAB — CBC
HEMATOCRIT: 29.1 % — AB (ref 39.0–52.0)
Hemoglobin: 9.4 g/dL — ABNORMAL LOW (ref 13.0–17.0)
MCH: 30.1 pg (ref 26.0–34.0)
MCHC: 32.3 g/dL (ref 30.0–36.0)
MCV: 93.3 fL (ref 78.0–100.0)
PLATELETS: 217 10*3/uL (ref 150–400)
RBC: 3.12 MIL/uL — ABNORMAL LOW (ref 4.22–5.81)
RDW: 15.3 % (ref 11.5–15.5)
WBC: 13.8 10*3/uL — ABNORMAL HIGH (ref 4.0–10.5)

## 2018-02-19 LAB — BASIC METABOLIC PANEL
Anion gap: 5 (ref 5–15)
BUN: 9 mg/dL (ref 6–20)
CALCIUM: 7.3 mg/dL — AB (ref 8.9–10.3)
CO2: 19 mmol/L — AB (ref 22–32)
CREATININE: 0.8 mg/dL (ref 0.61–1.24)
Chloride: 116 mmol/L — ABNORMAL HIGH (ref 101–111)
GFR calc non Af Amer: >60 mL/min (ref >60)
Glucose, Bld: 98 mg/dL (ref 65–99)
Potassium: 3.3 mmol/L — ABNORMAL LOW (ref 3.5–5.1)
SODIUM: 140 mmol/L (ref 135–145)

## 2018-02-19 LAB — HEMOGLOBIN AND HEMATOCRIT, BLOOD
HCT: 30.9 % — ABNORMAL LOW (ref 39.0–52.0)
HEMATOCRIT: 32 % — AB (ref 39.0–52.0)
Hemoglobin: 9.8 g/dL — ABNORMAL LOW (ref 13.0–17.0)
Hemoglobin: 10.3 g/dL — ABNORMAL LOW (ref 13.0–17.0)

## 2018-02-19 LAB — PROTIME-INR
INR: 1.22
Prothrombin Time: 15.3 seconds — ABNORMAL HIGH (ref 11.4–15.2)

## 2018-02-19 MED ORDER — FENTANYL CITRATE (PF) 100 MCG/2ML IJ SOLN
INTRAMUSCULAR | Status: AC
  Filled 2018-02-19: qty 2

## 2018-02-19 MED ORDER — LIDOCAINE HCL 1 % IJ SOLN
INTRAMUSCULAR | Status: AC
  Filled 2018-02-19: qty 20

## 2018-02-19 MED ORDER — MIDAZOLAM HCL 2 MG/2ML IJ SOLN
INTRAMUSCULAR | Status: AC
  Filled 2018-02-19: qty 2

## 2018-02-19 MED ORDER — POLYVINYL ALCOHOL 1.4 % OP SOLN
1.0000 [drp] | OPHTHALMIC | Status: DC | PRN
  Administered 2018-02-19 – 2018-02-23 (×2): 1 [drp] via OPHTHALMIC
  Filled 2018-02-19: qty 15

## 2018-02-19 MED ORDER — FENTANYL CITRATE (PF) 100 MCG/2ML IJ SOLN
INTRAMUSCULAR | Status: AC | PRN
  Administered 2018-02-19 (×2): 25 ug via INTRAVENOUS

## 2018-02-19 MED ORDER — MIDAZOLAM HCL 2 MG/2ML IJ SOLN
INTRAMUSCULAR | Status: AC | PRN
  Administered 2018-02-19: 1 mg via INTRAVENOUS

## 2018-02-19 MED ORDER — POTASSIUM CHLORIDE 10 MEQ/100ML IV SOLN
10.0000 meq | INTRAVENOUS | Status: AC
  Administered 2018-02-19 (×4): 10 meq via INTRAVENOUS
  Filled 2018-02-19 (×4): qty 100

## 2018-02-19 NOTE — Sedation Documentation (Signed)
Patient is resting comfortably. 

## 2018-02-19 NOTE — Progress Notes (Signed)
Subjective: The medical team met with [facility] and his family today to discuss the treatment course. He reports no overnight events and reports feeling significantly better today compared to yesterday. His family also endorses improvement in his overall condition. We discussed the plan for CT-guided biopsy today and answered questions for the family in regards to next steps once we receive the biopsy results. Patient and family were amenable to the plan.   Objective: Vital signs in last 24 hours: Vitals:   02/18/18 1617 02/18/18 1836 02/18/18 2056 02/19/18 0504  BP: (!) 100/48 (!) 111/56 (!) 126/55 (!) 124/54  Pulse: 68 65 71 70  Resp: 20 (!) '24 12 16  '$ Temp: 98.4 F (36.9 C) 98.4 F (36.9 C) 99 F (37.2 C) 97.7 F (36.5 C)  TempSrc: Oral Oral    SpO2: 99% 96% 98% 98%  Weight:      Height:       Weight change:   Intake/Output Summary (Last 24 hours) at 02/19/2018 1310 Last data filed at 02/19/2018 1051 Gross per 24 hour  Intake 4224.17 ml  Output 1605 ml  Net 2619.17 ml   BP (!) 124/54 (BP Location: Left Arm)   Pulse 70   Temp 97.7 F (36.5 C)   Resp 16   Ht 6' (1.829 m)   Wt 150 lb (68 kg)   SpO2 98%   BMI 20.34 kg/m   General Appearance:    Alert, cooperative, no distress, appears stated age  Head:    Normocephalic, without obvious abnormality, atraumatic  Lungs:     Clear to auscultation bilaterally, respirations unlabored  Chest wall:    No tenderness or deformity  Heart:    Regular rate and rhythm, S1 and S2 normal, no murmur, rub   or gallop  Abdomen:     Soft, non-tender, bowel sounds active all four quadrants,    no masses, no organomegaly  Extremities:   Extremities normal, atraumatic, no cyanosis or edema  Pulses:   2+ and symmetric all extremities  Skin:   Skin color, texture, turgor normal, no rashes or lesions   Lab Results: Recent Results   Collection Time: 02/19/18  4:34 AM  Result Value Ref Range   WBC 13.8 (H) 4.0 - 10.5 K/uL   RBC 3.12 (L) 4.22  - 5.81 MIL/uL   Hemoglobin 9.4 (L) 13.0 - 17.0 g/dL   HCT 29.1 (L) 39.0 - 52.0 %   MCV 93.3 78.0 - 100.0 fL   MCH 30.1 26.0 - 34.0 pg   MCHC 32.3 30.0 - 36.0 g/dL   RDW 15.3 11.5 - 15.5 %   Platelets 217 150 - 400 K/uL    Comment: Performed at [facility], [place] [address] [place], [place] [address]  Basic metabolic panel     Status: Abnormal   Collection Time: 02/19/18  4:34 AM  Result Value Ref Range   Sodium 140 135 - 145 mmol/L   Potassium 3.3 (L) 3.5 - 5.1 mmol/L   Chloride 116 (H) 101 - 111 mmol/L   CO2 19 (L) 22 - 32 mmol/L   Glucose, Bld 98 65 - 99 mg/dL   BUN 9 6 - 20 mg/dL   Creatinine, Ser 0.80 0.61 - 1.24 mg/dL   Calcium 7.3 (L) 8.9 - 10.3 mg/dL   GFR calc non Af Amer >60 >60 mL/min   GFR calc Af Amer >60 >60 mL/min    Comment: (NOTE) The eGFR has been calculated using the CKD EPI equation.  This calculation has not been validated in all clinical situations. eGFR's persistently <60 mL/min signify possible Chronic Kidney Disease.    Anion gap 5 5 - 15    Comment: Performed at [facility] [address], [place], [place] [address]  Protime-INR     Status: Abnormal   Collection Time: 02/19/18  4:34 AM  Result Value Ref Range   Prothrombin Time 15.3 (H) 11.4 - 15.2 seconds   INR 1.22     Comment: Performed at [facility] [address], [place], [place] [address]  Hemoglobin and hematocrit, blood     Status: Abnormal   Collection Time: 02/19/18 11:45 AM  Result Value Ref Range   Hemoglobin 9.8 (L) 13.0 - 17.0 g/dL   HCT 30.9 (L) 39.0 - 52.0 %    Comment: Performed at [facility], [name] [address] [place], [place] [address]    Micro Results: No results found for this or any previous visit (from the past 240 hour(s)). Studies/Results: No results found. Medications: I have reviewed the patient's current medications. Scheduled Meds: . gabapentin  300 mg Oral BID  . lidocaine  1 application Urethral Once   Continuous Infusions: .  sodium chloride 150 mL/hr at 02/19/18 1105  . potassium chloride 10 mEq (02/19/18 1220)   PRN Meds:.morphine injection, sodium chloride Assessment/Plan: Principal Problem:   Acute GI bleeding Active Problems:   Chronic pain   Acute blood loss anemia   Gastric mass   Leukocytosis  [facility] is an 84 y.o. Male with past medical history of asthma and chronic pain who presented with melena x 2 and a significant hemoglobin drop. Recent CT suggested gastric mass to be evaluated by IR on 03/21.   #GI Bleed. CT abdomen found a gastric mass at the lesser curvature of the stomach with concern for malignancy. Hgb this morning was found to be stable at 8.1. Later in the day, there was a drop to 7.4. Patient is grossly asymptomatic. Will consider transfusing 1 pRBCs following repeat H/H. Current Hgb at 9.4. GI following. Plan to obtain CT-guided biopsy via IR today.  - GI following, recommendations appreciated - Percutaneous CT-guided biopsy of gastric mass on 03/21 - Continue NS 150 cc/hr - Continue Pantoprazole   #Acute Kidney Injury - resolved.  #Acute urinary retention. Hx of prostate cancer s/p TURP. No definite indication on radiology report whether prostate is present on CT abd/pelvis. No history of retention. Renal fxn stable. S/p Coude catheter placement. - Coude catheter - Continue IV fluid hydration - Continue to monitor I/O - Replete potassium, level this AM at 3.3  #Leukocytosis. WBCs trending down in the setting of GI bleed. No s/s of infection. Most recent WBC found to be 13.8 (down from 15.0).  - Continue to monitor  #Chronic Pain. Patient takes home Oxymorphone d/t chronic back and joint pains.  - Morphine 1 mg q4 hrs prn  Dispo: Anticipated d/c in approximately 2-4 days, pending results   This is a Careers information officer Note.  The care of the patient was discussed with Dr. Isac Sarna and the assessment and plan formulated with their assistance.  Please see their attached  note for official documentation of the daily encounter.   LOS: 3 days   Toy Cookey, Medical Student 02/19/2018, 1:10 PM

## 2018-02-19 NOTE — Sedation Documentation (Signed)
Pt complains of pain at procedure site. See [facility]

## 2018-02-19 NOTE — Procedures (Signed)
  Procedure: CT core biopsy gastric mass   EBL:   minimal Complications:  none immediate  See full dictation in [facility].  Dillard Cannon MD Main # [phone] Pager  [phone]

## 2018-02-19 NOTE — Progress Notes (Signed)
   Subjective:  No acute events overnight. Patient is feeling better today and voices no complaints other than feeling tired. Discussed with patient will continue to work with GI and await biopsy results. Son and 2 granddaughters at bedside. They had several questions about further imaging and timeline for biopsy results. Patient and family verbalized understanding and are in agreement with plan. All questions answered.    Objective:  Vital signs in last 24 hours: Vitals:   02/18/18 1617 02/18/18 1836 02/18/18 2056 02/19/18 0504  BP: (!) 100/48 (!) 111/56 (!) 126/55 (!) 124/54  Pulse: 68 65 71 70  Resp: 20 (!) 24 12 16   Temp: 98.4 F (36.9 C) 98.4 F (36.9 C) 99 F (37.2 C) 97.7 F (36.5 C)  TempSrc: Oral Oral    SpO2: 99% 96% 98% 98%  Weight:      Height:       Physical Exam  Constitutional: He is oriented to person, place, and time.  Elderly male, tired appearing, lying in bed in no acute distress  Cardiovascular: Normal rate, regular rhythm and normal heart sounds. Exam reveals no gallop and no friction rub.  No murmur heard. Pulmonary/Chest: Effort normal. No respiratory distress.  Abdominal: Soft. Bowel sounds are normal. He exhibits no distension. There is no tenderness.  Musculoskeletal: He exhibits no edema.  Neurological: He is alert and oriented to person, place, and time.    Assessment/Plan:  Principal Problem:   Acute GI bleeding Active Problems:   Chronic pain   Acute blood loss anemia   Gastric mass   Leukocytosis  # GI bleed: Patient presented with 2-day history of multiple episodes of melena and found to have a gastric mass and lesser curvature on EGD associated with ulceration concerning for malignancy. CT abdomen/pelvis showed gastric mass. Patient is feeling better this morning and is hemodynamically stable. Hgb stable at 9.4 today. Plan for CT-guided biopsy today.  - GI following, appreciate assistance and recommendations. Plan for percutaneous  CT-directed biopsy of gastric mass today.  - Continue IV Protonix gtt  - NS @ 150 cc/hr  - Will stop home aspirin on discharge   # Acute urinary retention:  Hx of prostate cancer s/p TURP. However, prostate present on CT abd/pelvis per radiology report. No prior history of retention. Renal function stable and normal. Suspect medication side effect. Will leave Coude catheter in place.  - Continue IV fluid hydration - Continue to monitor I/Os  # Leukocytosis: This is in the setting of GI bleed. WBC trending down. Low suspicion for infectious etiology at this time but will monitor for signs/symptoms of infection given likely abdominal malignancy and could be immunocompromised.  - Will continue to monitor   # Chronic pain: Patient has chronic back and joint pains for which he takes Oxymorphone at home.  - Morphine 1 mg q4 prn   Dispo: Anticipated discharge in approximately 2-4 day(s).   Welford Roche, MD 02/19/2018, 6:21 AM Pager: [phone]

## 2018-02-19 NOTE — Sedation Documentation (Signed)
Pt resting, vitals stable, md at bedside. Procedure started

## 2018-02-20 DIAGNOSIS — Z79899 Other long term (current) drug therapy: Secondary | ICD-10-CM

## 2018-02-20 DIAGNOSIS — G2581 Restless legs syndrome: Secondary | ICD-10-CM

## 2018-02-20 LAB — CBC
HCT: 30 % — ABNORMAL LOW (ref 39.0–52.0)
Hemoglobin: 9.4 g/dL — ABNORMAL LOW (ref 13.0–17.0)
MCH: 29.1 pg (ref 26.0–34.0)
MCHC: 31.3 g/dL (ref 30.0–36.0)
MCV: 92.9 fL (ref 78.0–100.0)
PLATELETS: 272 10*3/uL (ref 150–400)
RBC: 3.23 MIL/uL — ABNORMAL LOW (ref 4.22–5.81)
RDW: 15.3 % (ref 11.5–15.5)
WBC: 12.1 10*3/uL — AB (ref 4.0–10.5)

## 2018-02-20 LAB — BASIC METABOLIC PANEL
ANION GAP: 8 (ref 5–15)
BUN: 8 mg/dL (ref 6–20)
CO2: 18 mmol/L — ABNORMAL LOW (ref 22–32)
Calcium: 7.4 mg/dL — ABNORMAL LOW (ref 8.9–10.3)
Chloride: 112 mmol/L — ABNORMAL HIGH (ref 101–111)
Creatinine, Ser: 0.81 mg/dL (ref 0.61–1.24)
GFR calc Af Amer: >60 mL/min (ref >60)
GLUCOSE: 84 mg/dL (ref 65–99)
Potassium: 3.7 mmol/L (ref 3.5–5.1)
SODIUM: 138 mmol/L (ref 135–145)

## 2018-02-20 LAB — HEMOGLOBIN AND HEMATOCRIT, BLOOD
HCT: 32 % — ABNORMAL LOW (ref 39.0–52.0)
HCT: 31.9 % — ABNORMAL LOW (ref 39.0–52.0)
HEMOGLOBIN: 10.2 g/dL — AB (ref 13.0–17.0)
Hemoglobin: 10.2 g/dL — ABNORMAL LOW (ref 13.0–17.0)

## 2018-02-20 MED ORDER — GABAPENTIN 300 MG PO CAPS
300.0000 mg | ORAL_CAPSULE | Freq: Three times a day (TID) | ORAL | Status: DC
  Administered 2018-02-20 – 2018-02-24 (×12): 300 mg via ORAL
  Filled 2018-02-20 (×12): qty 1

## 2018-02-20 NOTE — Progress Notes (Signed)
   Subjective:  No acute events overnight. Patient is s/p CT guided biopsy. Doing overall well but continues to report melena. Had 2 dark stools this morning. Continues to complain of bilateral leg that worsens at night. Patient's son is concern patient has been in bed for several days. Discussed with patient and son will continue to monitor blood counts and wait for biopsy results. Also discussed PT consult and  increase frequency of gabapentin for leg pain. Patient and family verbalized understanding. All questions answered.   Objective:  Vital signs in last 24 hours: Vitals:   02/19/18 1747 02/19/18 1750 02/19/18 1758 02/20/18 0540  BP: (!) 117/59 (!) 112/58 112/61 (!) 143/63  Pulse: 71 72 74 73  Resp: (!) 24 (!) 23 12 18   Temp:    98 F (36.7 C)  TempSrc:      SpO2: 97% 97% 98% 97%  Weight:      Height:       Physical Exam  Constitutional: He is oriented to person, place, and time.  Elderly male, tired-appearing, lying in bed in no acute distress   Cardiovascular: Normal rate, regular rhythm and normal heart sounds. Exam reveals no gallop and no friction rub.  No murmur heard. Pulmonary/Chest: Effort normal. No respiratory distress.  Abdominal: Soft. Bowel sounds are normal. He exhibits no distension. There is no tenderness.  Musculoskeletal: He exhibits no edema.  Neurological: He is alert and oriented to person, place, and time.    Assessment/Plan:  Principal Problem:   Acute GI bleeding Active Problems:   Chronic pain   Acute blood loss anemia   Gastric mass   Leukocytosis  #GI bleed:Patientpresented with 2-day history of multiple episodes of melena and found to have a gastric mass and lesser curvature on EGDassociated with ulceration concerning for malignancy. CT abdomen/pelvis showed gastric mass. He went for CT-guided core biopsy yesterday and awaiting results. He continues to do well but continues to report episodes of melena. He remains hemodynamically stable  and Hgb stable at ~9. Will continue to monitor blood counts and follow up biopsy results.  - GI following, appreciate assistance and recommendations - Continue to monitor H/H  - F/u biopsy results  -Continue IVProtonixgtt -NS @ 150 cc/hr  - Will stop home aspirin on discharge  # Acute urinary retention:  Hx of prostate cancer s/p TURP. However, prostate present on CT abd/pelvis per radiology report. No prior history of retention. Renal function stable and normal. Suspect medication side effect. Coude catheter in place.  - Continue IV fluid hydration -Continue to monitor I/Os  #Leukocytosis: Continues to trend down. This is in the setting of GI bleed. No signs/symptoms of infection.  - Will continue to monitor  #Chronic pain: Patient has chronic back and joint pains for which he takes Oxymorphone at home. Per son, patient has not been complaining of back pain and would like to continue IV morphine as needed.  - Morphine 1 mg q4 prn  # Restless leg syndrome: Patient continues to complain of bilateral leg pain that [place] worse at night. He takes gabapentin TID at home and has been taking 300 mg BID during this admission. Will increase to home dose.  - Increase gabapentin to 300 mg TID   Dispo: Anticipated discharge in approximately 2-3 day(s).   Welford Roche, MD 02/20/2018, 6:34 AM Pager: [phone]

## 2018-02-20 NOTE — Evaluation (Signed)
Physical Therapy Evaluation Patient Details Name: Casey Gates MRN: [id number] DOB: 11-Aug-1926 Today's Date: 02/20/2018   History of Present Illness  Casey Gates is an 84yo white male who comes to [facility] after noted melena, decreased apetite, and hematemsis x1 in the ED. While admitted, noted to have a large growth in stomach, now s/p Bx, awaiting results. H&H low and variabel whiel admitted, most recent, Hb: 10.2, HCT: 32.0. At baseline, pt lives at [place], uses a RW win his apartment with heavy AMB difficulty related to chronic low back pain and BLE weakness, and motorized WC/scooter outside of room.  PMH: asthma, OSA, PrCA, chronic low back pain, Lt eye blindness, Left quads tendon rupture s/p repair, zerop falls in past 3 months.   Clinical Impression  Pt admitted with above diagnosis. Pt currently with functional limitations due to the deficits listed below (see "PT Problem List"). Upon entry, the patient is received semirecumbent in bed, no family/caregiver present. The pt is awake and agreeable to participate. No acute distress noted at this time. Orthostatic vitals taken as good as possible (standing tolerance limited) with BP drop. Bed mobility is modI needing extra time, STS transfers is at [place] level with RW, but pivot transfer to chair requires modA. Pt tolerates standing less than 20 seconds per bout. Pt was mobilizing completely with modified independence PTA, but AMB in apartment was very limited in distance d/t chronic lumbar/LE impairment. Empirically, the patient demonstrates increased risk of recurrent falls AEB gait speed <0.71m/s, forward reach <5", and limited standing tolerance in session. Pt likely could not tolerate high frequency long duration skilled PT services offered at [facility], but would do well with HHPT services. Pt will need additional assistance at home at DC for mobility and ADL performance. Pt will benefit from skilled PT intervention to increase independence  and safety with basic mobility in preparation for discharge to the venue listed below.       Follow Up Recommendations Home health PT;Supervision for mobility/OOB;Supervision/Assistance - 24 hour    Equipment Recommendations  None recommended by PT    Recommendations for Other Services       Precautions / Restrictions        Mobility  Bed Mobility Overal bed mobility: Needs Assistance Bed Mobility: Supine to Sit     Supine to sit: Supervision     General bed mobility comments: moves well, slow and cautious   Transfers Overall transfer level: Needs assistance Equipment used: 1 person hand held assist;Rolling walker (2 wheeled) Transfers: Stand Pivot Transfers;Sit to/from Stand Sit to Stand: Min guard(c RW) Stand pivot transfers: Min assist(dependent style transfer)       General transfer comment: heavy effort, signficiant postural limitations from scoliosis, trunk flexion posture and flexed knee posture.   Ambulation/Gait Ambulation/Gait assistance: (not able at this time. )              Stairs            Wheelchair Mobility    Modified Rankin (Stroke Patients Only)       Balance Overall balance assessment: Modified Independent;Mild deficits observed, not formally tested                                           Pertinent Vitals/Pain Pain Assessment: No/denies pain    Home Living Family/patient expects to be discharged to:: Private residence Living Arrangements: Alone  Available Help at Discharge: Family;Available PRN/intermittently Type of Home: Independent living facility Home Access: Level entry;Elevator(third floor )     Home Layout: One level Home Equipment: Walker - 2 wheels;Electric scooter      Prior Function Level of Independence: Needs assistance   Gait / Transfers Assistance Needed: limited household distances with RW in apartment; scooter outside  ADL's / Homemaking Assistance Needed: Patient was sponge  bathing, independent for bathing and dressing         Hand Dominance   Dominant Hand: Right    Extremity/Trunk Assessment        Lower Extremity Assessment Lower Extremity Assessment: Generalized weakness       Communication   Communication: No difficulties  Cognition Arousal/Alertness: Awake/alert Behavior During Therapy: WFL for tasks assessed/performed Overall Cognitive Status: Within Functional Limits for tasks assessed                                        General Comments      Exercises     Assessment/Plan    PT Assessment Patient needs continued PT services  PT Problem List Decreased strength;Decreased activity tolerance;Decreased balance;Decreased mobility       PT Treatment Interventions Gait training;Patient/family education;Functional mobility training;Therapeutic activities;Therapeutic exercise    PT Goals (Current goals can be found in the Care Plan section)  Acute Rehab PT Goals Patient Stated Goal: regain independence for home living PT Goal Formulation: With patient Time For Goal Achievement: 03/06/18 Potential to Achieve Goals: Fair    Frequency Min 3X/week   Barriers to discharge Decreased caregiver support      Co-evaluation               AM-PAC PT "6 Clicks" Daily Activity  Outcome Measure Difficulty turning over in bed (including adjusting bedclothes, sheets and blankets)?: A Lot Difficulty moving from lying on back to sitting on the side of the bed? : A Lot Difficulty sitting down on and standing up from a chair with arms (e.g., wheelchair, bedside commode, etc,.)?: A Lot Help needed moving to and from a bed to chair (including a wheelchair)?: Total Help needed walking in hospital room?: Total Help needed climbing 3-5 steps with a railing? : Total 6 Click Score: 9    End of Session   Activity Tolerance: Patient limited by fatigue;Patient tolerated treatment well Patient left: in chair;with  family/visitor present;with call bell/phone within reach Nurse Communication: Mobility status PT Visit Diagnosis: Other abnormalities of gait and mobility (R26.89);Difficulty in walking, not elsewhere classified (R26.2);Dizziness and giddiness (R42)    Time: 6283-6629 PT Time Calculation (min) (ACUTE ONLY): 32 min   Charges:   PT Evaluation $PT Eval Moderate Complexity: 1 Mod PT Treatments $Therapeutic Activity: 8-22 mins   PT G Codes:        5:46 PM, 2018-03-08 Etta Grandchild, PT, DPT Physical Therapist - [place] [phone] (Pager)  [phone] (Office)      Keene Gilkey C 03/08/18, 5:41 PM

## 2018-02-20 NOTE — Care Management Important Message (Signed)
Important Message  Patient Details  Name: Casey Gates MRN: [id number] Date of Birth: 12/27/1925   Medicare Important Message Given:  Yes    Orbie Pyo 02/20/2018, 2:14 PM

## 2018-02-20 NOTE — Progress Notes (Signed)
Subjective: The medical team met with Casey Gates today. He reports feeling well overall. Reports multiple dark-colored bowel movements since yesterday afternoon. He feels that his bowel movements are increased from baseline. Also reports intermittent leg pain from knees to feet bilaterally and the consideration to increase Gabapentin dosing. Discussed the need to continue monitoring blood counts while awaiting biopsy results. Also discussed PT consult to work towards increasing patient's physical activity out of bed.   Objective: Vital signs in last 24 hours: Vitals:   02/19/18 1747 02/19/18 1750 02/19/18 1758 02/20/18 0540  BP: (!) 117/59 (!) 112/58 112/61 (!) 143/63  Pulse: 71 72 74 73  Resp: (!) 24 (!) '23 12 18  '$ Temp:    98 F (36.7 C)  TempSrc:      SpO2: 97% 97% 98% 97%  Weight:      Height:       Weight change:   Intake/Output Summary (Last 24 hours) at 02/20/2018 1234 Last data filed at 02/19/2018 1556 Gross per 24 hour  Intake 1878.33 ml  Output -  Net 1878.33 ml   BP (!) 143/63 (BP Location: Right Arm)   Pulse 73   Temp 98 F (36.7 C)   Resp 18   Ht 6' (1.829 m)   Wt 150 lb (68 kg)   SpO2 97%   BMI 20.34 kg/m   General Appearance:    Alert, cooperative, no distress, lying comfortably in bed  Lungs:     Clear to auscultation bilaterally, respirations unlabored  Chest wall:    No tenderness or deformity  Heart:    Regular rate and rhythm, S1 and S2 normal, no murmur, rub   or gallop  Abdomen:     Soft, non-tender, bowel sounds active all four quadrants,    no masses, no organomegaly  Extremities:   Extremities normal, atraumatic, no cyanosis or edema  Pulses:   2+ and symmetric all extremities  Skin:   Skin color, texture, turgor normal, no rashes or lesions   Lab Results: Recent Results  CBC     Status: Abnormal   Collection Time: 02/20/18  4:32 AM  Result Value Ref Range   WBC 12.1 (H) 4.0 - 10.5 K/uL   RBC 3.23 (L) 4.22 - 5.81 MIL/uL   Hemoglobin 9.4 (L)  13.0 - 17.0 g/dL   HCT 30.0 (L) 39.0 - 52.0 %   MCV 92.9 78.0 - 100.0 fL   MCH 29.1 26.0 - 34.0 pg   MCHC 31.3 30.0 - 36.0 g/dL   RDW 15.3 11.5 - 15.5 %   Platelets 272 150 - 400 K/uL    Comment: Performed at [facility], [place] [address] [place], [place] [address]  Basic metabolic panel     Status: Abnormal   Collection Time: 02/20/18  4:32 AM  Result Value Ref Range   Sodium 138 135 - 145 mmol/L   Potassium 3.7 3.5 - 5.1 mmol/L   Chloride 112 (H) 101 - 111 mmol/L   CO2 18 (L) 22 - 32 mmol/L   Glucose, Bld 84 65 - 99 mg/dL   BUN 8 6 - 20 mg/dL   Creatinine, Ser 0.81 0.61 - 1.24 mg/dL   Calcium 7.4 (L) 8.9 - 10.3 mg/dL   GFR calc non Af Amer >60 >60 mL/min   GFR calc Af Amer >60 >60 mL/min    Comment: (NOTE) The eGFR has been calculated using the CKD EPI equation. This calculation has not been validated in all clinical situations.  eGFR's persistently <60 mL/min signify possible Chronic Kidney Disease.    Anion gap 8 5 - 15    Comment: Performed at [facility] [address] [place], [place] [address]  Hemoglobin and hematocrit, blood     Status: Abnormal   Collection Time: 02/20/18 11:53 AM  Result Value Ref Range   Hemoglobin 10.2 (L) 13.0 - 17.0 g/dL   HCT 32.0 (L) 39.0 - 52.0 %    Comment: Performed at [facility], [place] [address], [place], [place] [address]    Micro Results: No results found for this or any previous visit (from the past 240 hour(s)). Studies/Results: Ct Biopsy  Result Date: 02/20/2018 CLINICAL DATA:  Gastric mass EXAM: CT GUIDED CORE BIOPSY OF GASTRIC MASS ANESTHESIA/SEDATION: Intravenous Fentanyl and Versed were administered as conscious sedation during continuous monitoring of the patient's level of consciousness and physiological / cardiorespiratory status by the radiology RN, with a total moderate sedation time of less than 20 minutes. PROCEDURE: The procedure risks, benefits, and alternatives were explained to the patient.  Questions regarding the procedure were encouraged and answered. The patient understands and consents to the procedure. Select axial scans through the abdomen obtained. The lesion was localized and an appropriate skin entry site was determined and marked. The operative field was prepped with chlorhexidinein a sterile fashion, and a sterile drape was applied covering the operative field. A sterile gown and sterile gloves were used for the procedure. Local anesthesia was provided with 1% Lidocaine. Under CT fluoroscopic guidance, a 17 gauge trocar needle was advanced to the margin of the lesion. Once needle tip position was confirmed, coaxial 18-gauge core biopsy samples were obtained, submitted in formalin to surgical pathology. The guide needle was removed. Postprocedure scans show no hemorrhage or other apparent complication. The patient tolerated the procedure well. COMPLICATIONS: None immediate FINDINGS: Complex gastric mass was localized. Representative core biopsy samples obtained as above. IMPRESSION: 1. Technically successful CT-guided core biopsy, gastric mass. Electronically Signed   By: Lucrezia Europe M.D.   On: 02/20/2018 08:33   Medications: I have reviewed the patient's current medications. Scheduled Meds: . gabapentin  300 mg Oral TID  . lidocaine  1 application Urethral Once   Continuous Infusions: . sodium chloride 150 mL/hr at 02/19/18 1855   PRN Meds:.morphine injection, polyvinyl alcohol, sodium chloride Assessment/Plan: Principal Problem:   Acute GI bleeding Active Problems:   Chronic pain   Acute blood loss anemia   Gastric mass   Leukocytosis  [name] is a 84 y.o. Male w/ PMH of asthma and chronic who p/w melena x 2 and significant Hgb drop. CT was significant for gastric mass. Biopsy was obtained on 03/21 via IR.   #GI Bleed. CT Abdomen found a gastric mass at the lesser curvature of the stomach with concern for malignancy. Hgb this morning was found to be stable at  9.4. As hemoglobin has fluctuated significantly, will continue to monitor. CT-guided biopsy via IR completed yesterday afternoon.  - GI following, recommendations appreciated - Awaiting biopsy results - Continue NS 150 cc/hr - Continue Pantoprazole - Continue to monitor H/H  #AKI - resolved #Acute Urinary retention. Hx of prostate cancer s/p TURP. No definitive indication on radiology report whether prostate is present on CT abd/pelvis. No history of retention. Renal function stable. S/p Coude catheter placement.  - Coude catheter - Continue IV fluid hydration - Monitor I/Os - Replete potassium, level currently at 3.7  #Leukocytosis. WBCs trending down. Currently at  12.1. No s/s of infection.  - Continue to monitor  #Restless Leg Syndrome #Chronic Pain. Patient reports ongoing leg pains while on Gabapentin BID dosing. No complaints of back pain. Takes Oxymorphone at home, currently on Morphine 1 mg q4hrs PRN.  - Continue Morphine 1 mg q4 hrs prn - Increase Gabapentin to 300 mg TID  Dispo: Anticipated discharge in approximately 2-3 days  This is a Careers information officer Note.  The care of the patient was discussed with Dr. Isac Sarna and the assessment and plan formulated with their assistance.  Please see their attached note for official documentation of the daily encounter.   LOS: 4 days   Toy Cookey, Medical Student 02/20/2018, 12:34 PM

## 2018-02-21 LAB — BASIC METABOLIC PANEL
ANION GAP: 8 (ref 5–15)
BUN: 7 mg/dL (ref 6–20)
CHLORIDE: 110 mmol/L (ref 101–111)
CO2: 21 mmol/L — ABNORMAL LOW (ref 22–32)
CREATININE: 0.76 mg/dL (ref 0.61–1.24)
Calcium: 7.5 mg/dL — ABNORMAL LOW (ref 8.9–10.3)
GFR calc non Af Amer: >60 mL/min (ref >60)
Glucose, Bld: 106 mg/dL — ABNORMAL HIGH (ref 65–99)
Potassium: 3.1 mmol/L — ABNORMAL LOW (ref 3.5–5.1)
SODIUM: 139 mmol/L (ref 135–145)

## 2018-02-21 LAB — CBC
HCT: 30.2 % — ABNORMAL LOW (ref 39.0–52.0)
HEMOGLOBIN: 9.8 g/dL — AB (ref 13.0–17.0)
MCH: 30.2 pg (ref 26.0–34.0)
MCHC: 32.5 g/dL (ref 30.0–36.0)
MCV: 93.2 fL (ref 78.0–100.0)
Platelets: 270 10*3/uL (ref 150–400)
RBC: 3.24 MIL/uL — AB (ref 4.22–5.81)
RDW: 15.6 % — ABNORMAL HIGH (ref 11.5–15.5)
WBC: 13.3 10*3/uL — AB (ref 4.0–10.5)

## 2018-02-21 LAB — HEMOGLOBIN AND HEMATOCRIT, BLOOD
HCT: 31 % — ABNORMAL LOW (ref 39.0–52.0)
HEMATOCRIT: 30.8 % — AB (ref 39.0–52.0)
HEMOGLOBIN: 9.8 g/dL — AB (ref 13.0–17.0)
Hemoglobin: 9.8 g/dL — ABNORMAL LOW (ref 13.0–17.0)

## 2018-02-21 LAB — MAGNESIUM: MAGNESIUM: 1.7 mg/dL (ref 1.7–2.4)

## 2018-02-21 MED ORDER — SENNA 8.6 MG PO TABS
1.0000 | ORAL_TABLET | Freq: Every day | ORAL | Status: DC | PRN
  Administered 2018-02-22: 8.6 mg via ORAL
  Filled 2018-02-21: qty 1

## 2018-02-21 MED ORDER — POTASSIUM CHLORIDE CRYS ER 20 MEQ PO TBCR
40.0000 meq | EXTENDED_RELEASE_TABLET | Freq: Two times a day (BID) | ORAL | Status: AC
  Administered 2018-02-21 (×2): 40 meq via ORAL
  Filled 2018-02-21 (×2): qty 2

## 2018-02-21 MED ORDER — MAGNESIUM SULFATE 2 GM/50ML IV SOLN
2.0000 g | Freq: Once | INTRAVENOUS | Status: AC
  Administered 2018-02-21: 2 g via INTRAVENOUS
  Filled 2018-02-21: qty 50

## 2018-02-21 MED ORDER — IPRATROPIUM-ALBUTEROL 0.5-2.5 (3) MG/3ML IN SOLN
3.0000 mL | Freq: Once | RESPIRATORY_TRACT | Status: AC
  Administered 2018-02-21: 3 mL via RESPIRATORY_TRACT
  Filled 2018-02-21: qty 3

## 2018-02-21 MED ORDER — POLYETHYLENE GLYCOL 3350 17 G PO PACK
17.0000 g | PACK | Freq: Every day | ORAL | Status: DC | PRN
  Administered 2018-02-21: 17 g via ORAL
  Filled 2018-02-21: qty 1

## 2018-02-21 MED ORDER — IPRATROPIUM-ALBUTEROL 0.5-2.5 (3) MG/3ML IN SOLN: 3.0000 mL | RESPIRATORY_TRACT | Status: DC | PRN

## 2018-02-21 NOTE — Evaluation (Signed)
Occupational Therapy Evaluation Patient Details Name: Casey Gates MRN: [id number] DOB: 18-Dec-1925 Today's Date: 02/21/2018    History of Present Illness Casey Gates is an 84yo male who comes to [facility] after noted melena, decreased apetite, and hematemsis x1 in the ED. While admitted, noted to have a large growth in stomach, now s/p Bx, awaiting results PMH includes: asthma, OSA, PrCA, chronic low back pain, Lt eye blindness, Left quads tendon rupture s/p repair, zero falls in past 3 months.    Clinical Impression   PTA Pt mod I for ADL in ILF environment. Pt took motorized scooter to dining hall. Was working with HHPT 2x a week prior to this episode. Presently, the Pt is mod A for LB ADL min guard for transfers and presents with generalized weakness, balance deficits and decreased activity tolerance. Pt will benefit from skilled OT in the acute setting and afterwards at [facility] level to maximize safety and independence in ADL and functional transfers - returning to PLOF.     Follow Up Recommendations  Home health OT;Supervision/Assistance - 24 hour(If 24 hour supervisin cannot be provided - Pt will need SNF)    Equipment Recommendations  None recommended by OT(Pt has appropriate DME)    Recommendations for Other Services Other (comment)(Palliative)     Precautions / Restrictions Precautions Precautions: Fall Restrictions Weight Bearing Restrictions: No      Mobility Bed Mobility               General bed mobility comments: Pt OOB in recliner for evaluation  Transfers Overall transfer level: Needs assistance Equipment used: Rolling walker (2 wheeled) Transfers: Sit to/from Stand Sit to Stand: Min guard         General transfer comment: heavy effort, signficiant postural limitations from scoliosis, trunk flexion posture and flexed knee posture. performed x3    Balance Overall balance assessment: Needs assistance Sitting-balance support: Bilateral upper extremity  supported;Feet supported Sitting balance-Leahy Scale: Good Sitting balance - Comments: in recliner, no LOB for LB dressing attempt   Standing balance support: Bilateral upper extremity supported;During functional activity Standing balance-Leahy Scale: Poor Standing balance comment: reliant on RW for balance                           ADL either performed or assessed with clinical judgement   ADL Overall ADL's : Needs assistance/impaired Eating/Feeding: Modified independent;Sitting   Grooming: Set up;Sitting Grooming Details (indicate cue type and reason): currently unable to maintain standing at sink for ADL Upper Body Bathing: Set up;Sitting Upper Body Bathing Details (indicate cue type and reason): for sponge bath Lower Body Bathing: Moderate assistance;Sitting/lateral leans   Upper Body Dressing : Min guard;Sitting   Lower Body Dressing: Moderate assistance Lower Body Dressing Details (indicate cue type and reason): Pt able to don/doff right sock but not left                     Vision         Perception     Praxis      Pertinent Vitals/Pain Pain Assessment: No/denies pain     Hand Dominance Right   Extremity/Trunk Assessment Upper Extremity Assessment Upper Extremity Assessment: Generalized weakness   Lower Extremity Assessment Lower Extremity Assessment: Generalized weakness   Cervical / Trunk Assessment Cervical / Trunk Assessment: Kyphotic;Other exceptions Cervical / Trunk Exceptions: chronic flexion due to pain   Communication Communication Communication: No difficulties   Cognition Arousal/Alertness: Awake/alert Behavior During  Therapy: WFL for tasks assessed/performed Overall Cognitive Status: Within Functional Limits for tasks assessed                                     General Comments  Pt is so pleasant and wonderful to work with. very determined individual    Exercises     Shoulder Instructions       Home Living Family/patient expects to be discharged to:: Private residence(ILF - [facility]) Living Arrangements: Alone Available Help at Discharge: Family;Available PRN/intermittently Type of Home: Independent living facility Home Access: Level entry;Elevator(3rd floor)     Home Layout: One level     Bathroom Shower/Tub: Occupational psychologist: Handicapped height Bathroom Accessibility: Yes How Accessible: Accessible via walker Home Equipment: [place] - 2 wheels;Electric scooter;Shower seat - built in;Grab bars - tub/shower;Hand held shower head          Prior Functioning/Environment Level of Independence: Needs assistance  Gait / Transfers Assistance Needed: limited household distances with RW in apartment; scooter outside ADL's / Homemaking Assistance Needed: Patient was sponge bathing, independent for bathing and dressing             OT Problem List: Decreased strength;Decreased range of motion;Decreased activity tolerance;Impaired balance (sitting and/or standing)      OT Treatment/Interventions: Self-care/ADL training;Therapeutic exercise;DME and/or AE instruction;Therapeutic activities;Manual therapy;Patient/family education;Balance training    OT Goals(Current goals can be found in the care plan section) Acute Rehab OT Goals Patient Stated Goal: regain independence for home living OT Goal Formulation: With patient Time For Goal Achievement: 03/07/18 Potential to Achieve Goals: Good ADL Goals Pt Will Perform Upper Body Bathing: with adaptive equipment;sitting;with set-up Pt Will Perform Lower Body Bathing: with set-up;with adaptive equipment;sit to/from stand Pt Will Perform Upper Body Dressing: with modified independence;sitting Pt Will Perform Lower Body Dressing: with modified independence;sit to/from stand Pt Will Transfer to Toilet: with supervision;ambulating Pt Will Perform Toileting - Clothing Manipulation and hygiene: with modified  independence;sit to/from stand  OT Frequency: Min 2X/week   Barriers to D/C: Decreased caregiver support  Pt lives in ILF and was getting HHPT 2x a week. At this time, he needs 24 hour assist - if ILF or family can provide then a return to home with HHOT would be appropriate. Otherwise he will need increased support from a facility       Co-evaluation              AM-PAC PT "6 Clicks" Daily Activity     Outcome Measure Help from another person eating meals?: None Help from another person taking care of personal grooming?: None(in sitting) Help from another person toileting, which includes using toliet, bedpan, or urinal?: A Lot Help from another person bathing (including washing, rinsing, drying)?: A Lot Help from another person to put on and taking off regular upper body clothing?: A Little Help from another person to put on and taking off regular lower body clothing?: A Lot 6 Click Score: 17   End of Session Equipment Utilized During Treatment: Gait belt;Rolling walker Nurse Communication: Mobility status  Activity Tolerance: Patient tolerated treatment well Patient left: in chair;with call bell/phone within reach  OT Visit Diagnosis: Unsteadiness on feet (R26.81);Other abnormalities of gait and mobility (R26.89);Muscle weakness (generalized) (M62.81)                Time: 6160-7371 OT Time Calculation (min): 34 min Charges:  OT  General Charges $OT Visit: 1 Visit OT Evaluation $OT Eval Moderate Complexity: 1 Mod OT Treatments $Self Care/Home Management : 8-22 mins G-Codes:     Hulda Humphrey OTR/L [place] 02/21/2018, 6:03 PM

## 2018-02-21 NOTE — Progress Notes (Addendum)
Spoke to Dr. Alen Blew with Oncology. No further workup needed for gastric mass while patient is in the hospital. This can be done as an outpatient. They will await biopsy results. Will call cancer center at the time of discharge to ensure patient has close follow up. Appreciate Dr. Hazeline Junker assistance and recommendations.

## 2018-02-21 NOTE — Progress Notes (Signed)
Pt c/o new onset wheezing and SOB. Lungs sound clear upon auscultation and O2 sats are 96, but when patient breathes hard he has an audible expiratory wheeze. Notified MD on call, received order for breathing treatment. Will continue to monitor.

## 2018-02-21 NOTE — Progress Notes (Signed)
Patient having irregular HR with PVC and short run of sinus tach (HR in 130's per central tele monitor) Looked at strip did not look like V Tach. Patient asleep. Paged MD. Will continue to monitor.

## 2018-02-21 NOTE — Progress Notes (Signed)
Subjective:  No acute events overnight. Patient eating breakfast this morning and reports he is doing well. Denies ongoing melena. I went back to patient's room in the afternoon and spoke to his son. He seemed concerned and stated that patient did not look well today. He believes patient is more fatigued and appears more tired than usual. Patient states he was not feeling well today and he does not feel ready to go back home. Discussed with patient we will continue to monitor his blood count and plan to consult oncology for further recommendations while we await for biopsy results. Patient and son verbalized understanding and are in agreement with plan.   Objective:  Vital signs in last 24 hours: Vitals:   02/20/18 1447 02/20/18 2158 02/21/18 0518 02/21/18 1347  BP: (!) 120/54 (!) 112/52 (!) 131/53 (!) 122/53  Pulse: 76 72 66 71  Resp: 18 18 18 20   Temp: 98.1 F (36.7 C) 99.8 F (37.7 C) (!) 97.5 F (36.4 C) 98.5 F (36.9 C)  TempSrc:  Oral Oral Oral  SpO2: 96% 97% 100% 98%  Weight:      Height:       Physical Exam  Constitutional: He is oriented to person, place, and time. He appears well-developed and well-nourished. No distress.  Cardiovascular: Normal rate, regular rhythm and normal heart sounds. Exam reveals no gallop and no friction rub.  No murmur heard. Pulmonary/Chest: Breath sounds normal. No respiratory distress. He has no wheezes. He has no rales.  Upper expiratory wheezes noted  Abdominal: Soft. Bowel sounds are normal. He exhibits no distension. There is no tenderness.  Musculoskeletal: He exhibits no edema.  Neurological: He is alert and oriented to person, place, and time.    Assessment/Plan:  Principal Problem:   Acute GI bleeding Active Problems:   Chronic pain   Acute blood loss anemia   Gastric mass   Leukocytosis   Restless leg syndrome  #GI bleed:Patientpresented with 2-day history of multiple episodes of melena and found to have a gastric mass  and lesser curvature on EGDassociated with ulceration concerning for malignancy. CT abdomen/pelvis showed gastric mass. CT-guided core biopsy performed on 3/21 and awaiting results. Doing well overall. He remains hemodynamically stable and Hgb stable at ~9. However, son concern that patient's status appears worsened today and he does not feel comfortable with discharging to independent-living. He would like to pursue an oncology consult at this time.  Will continue to monitor blood counts and follow up biopsy results.  - GI following, appreciate assistance and recommendations - Oncology consult  - Continue to monitor H/H  - F/u biopsy results  -ContinueIVProtonixgtt - Will stop home aspirin on discharge  # Acute urinary retention: Hx of prostate cancer s/p TURP. However, prostate present on CT abd/pelvis per radiology report. No prior history of retention. Renal function stable and normal. Suspect medication side effect. Coude catheter in place.  - Continue IV fluid hydration -Continue to monitor I/Os  #Leukocytosis: This is in the setting of GI bleed. No signs/symptoms of infection.  - Will continue to monitor  #Chronic pain: Patient has chronic back and joint pains for which he takes Oxymorphone at home. Per son, patient has not been complaining of back pain and would like to continue IV morphine as needed.  - Morphine 1 mg q4 prn  # Restless leg syndrome: - Continue gabapentin to 300 mg TID    Dispo: Anticipated discharge in approximately 1-2 day(s).   Welford Roche, MD 02/21/2018, 2:29 PM  Pager: [phone]

## 2018-02-22 LAB — CBC
HCT: 28.3 % — ABNORMAL LOW (ref 39.0–52.0)
Hemoglobin: 9 g/dL — ABNORMAL LOW (ref 13.0–17.0)
MCH: 29.5 pg (ref 26.0–34.0)
MCHC: 31.8 g/dL (ref 30.0–36.0)
MCV: 92.8 fL (ref 78.0–100.0)
Platelets: 277 10*3/uL (ref 150–400)
RBC: 3.05 MIL/uL — ABNORMAL LOW (ref 4.22–5.81)
RDW: 15.7 % — AB (ref 11.5–15.5)
WBC: 10.4 10*3/uL (ref 4.0–10.5)

## 2018-02-22 LAB — BASIC METABOLIC PANEL
Anion gap: 4 — ABNORMAL LOW (ref 5–15)
BUN: 7 mg/dL (ref 6–20)
CALCIUM: 7.7 mg/dL — AB (ref 8.9–10.3)
CO2: 21 mmol/L — ABNORMAL LOW (ref 22–32)
CREATININE: 0.81 mg/dL (ref 0.61–1.24)
Chloride: 114 mmol/L — ABNORMAL HIGH (ref 101–111)
GFR calc non Af Amer: >60 mL/min (ref >60)
GLUCOSE: 94 mg/dL (ref 65–99)
Potassium: 4.1 mmol/L (ref 3.5–5.1)
Sodium: 139 mmol/L (ref 135–145)

## 2018-02-22 LAB — MAGNESIUM: Magnesium: 1.9 mg/dL (ref 1.7–2.4)

## 2018-02-22 MED ORDER — MAGNESIUM SULFATE 2 GM/50ML IV SOLN
2.0000 g | Freq: Once | INTRAVENOUS | Status: AC
  Administered 2018-02-22: 2 g via INTRAVENOUS
  Filled 2018-02-22: qty 50

## 2018-02-22 MED ORDER — IPRATROPIUM-ALBUTEROL 0.5-2.5 (3) MG/3ML IN SOLN
3.0000 mL | Freq: Four times a day (QID) | RESPIRATORY_TRACT | Status: DC
  Administered 2018-02-22 – 2018-02-23 (×5): 3 mL via RESPIRATORY_TRACT
  Filled 2018-02-22 (×5): qty 3

## 2018-02-22 NOTE — Progress Notes (Signed)
   Subjective:  Patient denies nausea, vomiting, abd pain, headache, chest pain, shortness of breath, palpitations, dizziness.  His last BM was yesterday morning. He was able to eat most of his breakfast this morning which he states was more than he has eaten at one time in a while.   He does endorse wheezing.  Objective:  Vital signs in last 24 hours: Vitals:   02/21/18 0518 02/21/18 1347 02/21/18 2158 02/22/18 0613  BP: (!) 131/53 (!) 122/53 (!) 116/54 116/64  Pulse: 66 71 71 76  Resp: 18 20 18 20   Temp: (!) 97.5 F (36.4 C) 98.5 F (36.9 C) 98.5 F (36.9 C) 98.1 F (36.7 C)  TempSrc: Oral Oral Oral   SpO2: 100% 98% 95% 97%  Weight:      Height:       Constitutional: NAD, lying in bed comfortably CV: RRR, no M/R/G Resp: diffuse expiratory wheezing, no rales or rhonchi Abd: soft, NDNT, +BS Ext: no edema, warm  Assessment/Plan:  Principal Problem:   Acute GI bleeding Active Problems:   Chronic pain   Acute blood loss anemia   Gastric mass   Leukocytosis   Restless leg syndrome  GI bleed 2/2 gastric mass with submucosal ulceration:Patientpresented with 2-day history of multiple episodes of melena and found to have a gastric mass and lesser curvature on EGDassociated with ulceration concerning for malignancy. CT abdomen/pelvis showed gastric mass. CT-guided core biopsy performed on 3/21 and awaiting results. Dr. Frederico Hamman discussed with oncology yesterday who did not have further inpatient recommendations; they advised to call care coordinator once biopsy results are in to set up outpt appt if consistent with cancer. - F/u biopsy results - cytology not available on weekends  - Will stop home aspirin on discharge  Wheezing: No asthma or COPD history. Advised to use incentive spirometer q1hr while awake - patient demonstrated correct usage. Will add albuterol  Acute urinary retention: Hx of prostate cancer s/p TURP. No prior history of retention. Renal function stable  and normal. Suspect medication side effect. Coude catheter in place.  -Continue to monitor I/Os  Leukocytosis:  Resolved. This is in the setting of GI bleed. No signs/symptoms of infection.   Chronic pain: Patient has chronic back and joint pains for which he takes Oxymorphone at home. Per son, patient has not been complaining of back pain and would like to continue IV morphine as needed.  - Morphine 1 mg q4 prn  Restless leg syndrome: - Continue gabapentin to 300 mg TID   Deconditioning: PT/OT recommend HH PT/OT if 24hr assistance is available which patient and son state is not available at his ALF.  --SW consult for SNF  Dispo: Anticipated discharge in approximately 1-2 day(s).   Alphonzo Grieve, MD 02/22/2018, 12:33 PM Pager: [phone]

## 2018-02-22 NOTE — Progress Notes (Signed)
RN notified by CCMD of patient having 19 beat run of SVT, followed by 13 beat run of wide QRS. RN reviewed strips and on-call MD paged. Patient sleeping with no c/o chest pain or discomfort. Will continue to monitor.

## 2018-02-22 NOTE — Progress Notes (Signed)
Trigeminy PVCs noted by CCMD at 1400

## 2018-02-23 DIAGNOSIS — M6281 Muscle weakness (generalized): Secondary | ICD-10-CM

## 2018-02-23 DIAGNOSIS — N3010 Interstitial cystitis (chronic) without hematuria: Secondary | ICD-10-CM

## 2018-02-23 DIAGNOSIS — C49A2 Gastrointestinal stromal tumor of stomach: Secondary | ICD-10-CM

## 2018-02-23 DIAGNOSIS — R062 Wheezing: Secondary | ICD-10-CM

## 2018-02-23 LAB — CBC
HCT: 29.4 % — ABNORMAL LOW (ref 39.0–52.0)
Hemoglobin: 9.4 g/dL — ABNORMAL LOW (ref 13.0–17.0)
MCH: 29.8 pg (ref 26.0–34.0)
MCHC: 32 g/dL (ref 30.0–36.0)
MCV: 93.3 fL (ref 78.0–100.0)
Platelets: 299 10*3/uL (ref 150–400)
RBC: 3.15 MIL/uL — ABNORMAL LOW (ref 4.22–5.81)
RDW: 15.5 % (ref 11.5–15.5)
WBC: 10.9 10*3/uL — ABNORMAL HIGH (ref 4.0–10.5)

## 2018-02-23 LAB — BASIC METABOLIC PANEL
Anion gap: 7 (ref 5–15)
BUN: 6 mg/dL (ref 6–20)
CO2: 23 mmol/L (ref 22–32)
Calcium: 7.8 mg/dL — ABNORMAL LOW (ref 8.9–10.3)
Chloride: 110 mmol/L (ref 101–111)
Creatinine, Ser: 0.86 mg/dL (ref 0.61–1.24)
GFR calc Af Amer: >60 mL/min (ref >60)
GFR calc non Af Amer: >60 mL/min (ref >60)
Glucose, Bld: 100 mg/dL — ABNORMAL HIGH (ref 65–99)
Potassium: 3.6 mmol/L (ref 3.5–5.1)
Sodium: 140 mmol/L (ref 135–145)

## 2018-02-23 LAB — MAGNESIUM: Magnesium: 2 mg/dL (ref 1.7–2.4)

## 2018-02-23 MED ORDER — IPRATROPIUM-ALBUTEROL 0.5-2.5 (3) MG/3ML IN SOLN: 3.0000 mL | RESPIRATORY_TRACT | Status: DC | PRN

## 2018-02-23 MED ORDER — POTASSIUM CHLORIDE CRYS ER 20 MEQ PO TBCR
40.0000 meq | EXTENDED_RELEASE_TABLET | Freq: Once | ORAL | Status: AC
Start: 2018-02-23 — End: 2018-02-23
  Administered 2018-02-23: 40 meq via ORAL
  Filled 2018-02-23: qty 2

## 2018-02-23 NOTE — NC FL2 (Signed)
[place] MEDICAID FL2 LEVEL OF CARE SCREENING TOOL     IDENTIFICATION  Patient Name: Casey Gates Birthdate: Mar 14, 1926 Sex: male Admission Date (Current Location): 02/16/2018  [facility] and [place] Number:  Herbalist and Address:  [facility], [place] [address] [place],  [address]      Provider Number: [id number]  Attending Physician Name and Address:  Oda Kilts, MD  Relative Name and Phone Number:  [name], son, [phone]    Current Level of Care: Hospital Recommended Level of Care: [place] Prior Approval Number:    Date Approved/Denied:   PASRR Number: [id number]  Discharge Plan: SNF    Current Diagnoses: Patient Active Problem List   Diagnosis Date Noted  . Restless leg syndrome   . Gastric mass 02/17/2018  . Leukocytosis 02/17/2018  . Acute GI bleeding 02/16/2018  . Acute blood loss anemia 02/16/2018  . Constipation 01/20/2018  . Exertional shortness of breath 12/30/2017  . Rupture of left quadriceps muscle 07/07/2017  . Rupture of left quadriceps tendon 06/26/2017  . Eczema 06/26/2017  . Chest pain 07/29/2015  . OSA on CPAP 07/10/2015  . CPAP use counseling 07/10/2015  . Excessive daytime sleepiness 02/22/2015  . Sleep apnea 02/22/2015  . Snoring 02/22/2015  . Nocturia more than twice per night 02/22/2015  . Syncope 12/31/2012  . Bifascicular bundle branch block 12/31/2012    Class: Acute  . Cellulitis and abscess of leg, except foot 12/04/2012  . Chronic pain 12/04/2012  . Kyphosis 12/04/2012  . H/O unilateral nephrectomy 12/04/2012  . H/O prostate cancer 12/04/2012  . S/P TURP 12/04/2012    Orientation RESPIRATION BLADDER Height & Weight     Self, Time, Situation, Place  Normal Continent, Indwelling catheter Weight: 68 kg (150 lb) Height:  6' (182.9 cm)  BEHAVIORAL SYMPTOMS/MOOD NEUROLOGICAL BOWEL NUTRITION STATUS      Continent Diet(Please see DC Summary)  AMBULATORY  STATUS COMMUNICATION OF NEEDS Skin   Limited Assist Verbally Normal                       Personal Care Assistance Level of Assistance  Bathing, Feeding, Dressing Bathing Assistance: Maximum assistance Feeding assistance: Independent Dressing Assistance: Limited assistance     Functional Limitations Info             SPECIAL CARE FACTORS FREQUENCY  PT (By licensed PT), OT (By licensed OT)     PT Frequency: 5x/week OT Frequency: 3x/week            Contractures      Additional Factors Info  Code Status, Allergies Code Status Info: Full Allergies Info: Peanuts Peanut Oil, Coconut Flavor Flavoring Agent, Coconut Oil           Current Medications (02/23/2018):  This is the current hospital active medication list Current Facility-Administered Medications  Medication Dose Route Frequency Provider Last Rate Last Dose  . gabapentin (NEURONTIN) capsule 300 mg  300 mg Oral TID Welford Roche, MD   300 mg at 02/22/18 2134  . ipratropium-albuterol (DUONEB) 0.5-2.5 (3) MG/3ML nebulizer solution 3 mL  3 mL Nebulization Q6H Alphonzo Grieve, MD   3 mL at 02/23/18 0756  . lidocaine (XYLOCAINE) 2 % jelly 1 application  1 application Urethral Once Joni Reining C, DO      . morphine 4 MG/ML injection 1 mg  1 mg Intravenous Q4H PRN Shela Leff, MD   1 mg at 02/18/18 0651  . polyethylene  glycol (MIRALAX / GLYCOLAX) packet 17 g  17 g Oral Daily PRN Tawny Asal, MD   17 g at 02/21/18 2240  . polyvinyl alcohol (LIQUIFILM TEARS) 1.4 % ophthalmic solution 1 drop  1 drop Both Eyes PRN Joni Reining C, DO   1 drop at 02/19/18 2111  . senna (SENOKOT) tablet 8.6 mg  1 tablet Oral Daily PRN Tawny Asal, MD   8.6 mg at 02/22/18 0929  . sodium chloride (OCEAN) 0.65 % nasal spray 1 spray  1 spray Each Nare PRN Alphonzo Grieve, MD   1 spray at 02/17/18 2120     Discharge Medications: Please see discharge summary for a list of discharge medications.  Relevant Imaging  Results:  Relevant Lab Results:   Additional Information SS#:[name], [place]

## 2018-02-23 NOTE — Progress Notes (Addendum)
   Subjective:  No acute events overnight. Casey Gates reports he is doing better this morning. He had a bowel movement this morning that looked normal. Discussed with patient we continue to await for biopsy results. Also discussed SNF placement. Son not present at bedside. Patient expresses concern about being discharged prior to biopsy results. Discussed with patient we will follow up with biopsy results. Reassured him that blood counts are stable.   Objective:  Vital signs in last 24 hours: Vitals:   02/22/18 2123 02/22/18 2123 02/23/18 0456 02/23/18 0757  BP: (!) 118/58 (!) 108/56 107/61   Pulse: 72 73 83   Resp: 20 20 18    Temp: 99.6 F (37.6 C) 99.6 F (37.6 C) (!) 97.4 F (36.3 C)   TempSrc: Oral Oral Oral   SpO2: 94% 94% 96% 97%  Weight:      Height:       Physical Exam  Constitutional: He is oriented to person, place, and time. He appears well-developed and well-nourished. No distress.  Cardiovascular: Normal rate, regular rhythm and normal heart sounds. Exam reveals no gallop and no friction rub.  No murmur heard. Pulmonary/Chest: Effort normal and breath sounds normal. No respiratory distress. He has no wheezes. He has no rales.  Abdominal: Soft. Bowel sounds are normal. He exhibits no distension. There is no tenderness.  Musculoskeletal: He exhibits no edema.  Neurological: He is alert and oriented to person, place, and time.    Assessment/Plan:  Principal Problem:   Acute GI bleeding Active Problems:   Chronic pain   Acute blood loss anemia   Gastric mass   Leukocytosis   Restless leg syndrome  # GI bleed 2/2 gastric mass with submucosal ulceration:Patientpresented with 2-day history of multiple episodes of melena and found to have a gastric mass and lesser curvature on EGDassociated with ulceration concerning for malignancy. CT abdomen/pelvis showed gastric mass. CT-guided core biopsy performed on 3/21 and awaiting results. Spoke to pathology who reported GIST  (GI stromal tumor). Spoke to Dr. Lebron Conners with oncology who recommended surgery consult to assess if tumor is resectable and if so, whether surgery can be performed here vs a tertiary center. Hgb remains stable ~9 and he is hemodynamically stable.  - Surgery consult, appreciate assistance and recommendations  - Will stop home aspirin on discharge  # Wheezing: No asthma or COPD history. Advised to use incentive spirometer q1hr while awake - patient demonstrated correct usage. No wheezing appreciated on exam today.   # Acute urinary retention: Hx of prostate cancer s/p TURP. No prior history of retention. Renal function stable and normal. Suspect medication side effect. Coude catheter in place.  -Continue to monitor I/Os  # Leukocytosis:Resolved. This is in the setting of GI bleed.No signs/symptoms of infection.  # Chronic pain: Patient has chronic back and joint pains for which he takes Oxymorphone at home. - Morphine 1 mg q4 prn  # Restless leg syndrome: - Continue gabapentin to 300 mg TID  # Deconditioning: PT/OT recommend HH PT/OT if 24hr assistance is available which patient and son state is not available at his ALF. Working on SNF placement.  - SW consult for SNF, family in process of choosing facility   Dispo: Anticipated discharge in approximately 1-2 day(s) pending surgical consult.   Welford Roche, MD 02/23/2018, 10:37 AM Pager: [phone]

## 2018-02-23 NOTE — Progress Notes (Signed)
Subjective: The team met with Casey Gates today, who reports feeling well today. He reports one recent bowel movement with no blood and no dark stools. He also reports that his wheezing has improved over the past few days as well. We discussed the possibility of moving to a skilled nursing facility following discharge to manage care. Patient was hesitant to discuss this without his son present. Will defer the conversation to when the son arrives at the hospital.   Objective: Vital signs in last 24 hours: Vitals:   02/22/18 2123 02/22/18 2123 02/23/18 0456 02/23/18 0757  BP: (!) 118/58 (!) 108/56 107/61   Pulse: 72 73 83   Resp: '20 20 18   '$ Temp: 99.6 F (37.6 C) 99.6 F (37.6 C) (!) 97.4 F (36.3 C)   TempSrc: Oral Oral Oral   SpO2: 94% 94% 96% 97%  Weight:      Height:       Weight change:  No intake or output data in the 24 hours ending 02/23/18 1201 BP 107/61 (BP Location: Left Arm)   Pulse 83   Temp (!) 97.4 F (36.3 C) (Oral)   Resp 18   Ht 6' (1.829 m)   Wt 150 lb (68 kg)   SpO2 97%   BMI 20.34 kg/m   General Appearance:    Alert, cooperative, no distress, appears stated age  Head:    Normocephalic, without obvious abnormality, atraumatic  Lungs:     Clear to auscultation bilaterally, respirations unlabored  Chest wall:    No tenderness or deformity  Heart:    Regular rate and rhythm, S1 and S2 normal, no murmur, rub   or gallop  Abdomen:     Soft, non-tender, bowel sounds active all four quadrants,    no masses, no organomegaly  Extremities:   Extremities normal, atraumatic, no cyanosis or edema  Pulses:   2+ and symmetric all extremities   Lab Results: Recent Results  CBC     Status: Abnormal   Collection Time: 02/23/18  6:46 AM  Result Value Ref Range   WBC 10.9 (H) 4.0 - 10.5 K/uL   RBC 3.15 (L) 4.22 - 5.81 MIL/uL   Hemoglobin 9.4 (L) 13.0 - 17.0 g/dL   HCT 29.4 (L) 39.0 - 52.0 %   MCV 93.3 78.0 - 100.0 fL   MCH 29.8 26.0 - 34.0 pg   MCHC 32.0 30.0 - 36.0  g/dL   RDW 15.5 11.5 - 15.5 %   Platelets 299 150 - 400 K/uL    Comment: Performed at [facility], [address] [place], [place] [address]  Basic metabolic panel     Status: Abnormal   Collection Time: 02/23/18  6:46 AM  Result Value Ref Range   Sodium 140 135 - 145 mmol/L   Potassium 3.6 3.5 - 5.1 mmol/L   Chloride 110 101 - 111 mmol/L   CO2 23 22 - 32 mmol/L   Glucose, Bld 100 (H) 65 - 99 mg/dL   BUN 6 6 - 20 mg/dL   Creatinine, Ser 0.86 0.61 - 1.24 mg/dL   Calcium 7.8 (L) 8.9 - 10.3 mg/dL   GFR calc non Af Amer >60 >60 mL/min   GFR calc Af Amer >60 >60 mL/min    Comment: (NOTE) The eGFR has been calculated using the CKD EPI equation. This calculation has not been validated in all clinical situations. eGFR's persistently <60 mL/min signify possible Chronic Kidney Disease.    Anion gap 7 5 -  15    Comment: Performed at [facility], [place] [address], [place], [place] [address]  Magnesium     Status: None   Collection Time: 02/23/18  6:46 AM  Result Value Ref Range   Magnesium 2.0 1.7 - 2.4 mg/dL    Comment: Performed at [facility] [address], [place], [place] [address]    Micro Results: No results found for this or any previous visit (from the past 240 hour(s)). Studies/Results: No results found. Medications: I have reviewed the patient's current medications. Scheduled Meds: . gabapentin  300 mg Oral TID  . ipratropium-albuterol  3 mL Nebulization Q6H  . lidocaine  1 application Urethral Once  . potassium chloride  40 mEq Oral Once   Continuous Infusions: PRN Meds:.morphine injection, polyethylene glycol, polyvinyl alcohol, senna, sodium chloride Assessment/Plan: Principal Problem:   Acute GI bleeding Active Problems:   Chronic pain   Acute blood loss anemia   Gastric mass   Leukocytosis   Restless leg syndrome  Casey Gates is a 84 y.o. Male who presented with melena x 2 and a 7 pt hemoglobin drop, found to have a gastric mass on CT.  Biopsy results pending.   #GI Bleed 2/2 gastric mass with submucosal ulceration. Patient found to have gastric mass on CT abd/pelvis. CT-guided biopsy performed on 3/21, awaiting full report. Spoke to pathology today who report preliminary result of gastrointestinal stromal tumor (GIST). Will consider consults from GI and/or surgical oncology to determine course of action. Hgb remains stable at 9.0 today.  - F/u biopsy results.  - Stop home aspirin on d/c  #Wheezing. Patient has no h/x of asthma or COPD. Advised to use incentive spirometer q1hr while awake. Patient demonstrated correct usage. No wheezing on exam today. - Continue to monitor   #Acute Urinary Retention. H/x of Prostate Cancer s/p TURP. No h/x of urinary retention. Renal function stable. Coude catheter in place. - Continue to monitor I/Os  #Chronic Pain. Patient has chronic back and joint pains. Takes Oxymorphone at home. - Morphine 1 mg q4hrs  #Restless Leg Syndrome - Continue Gabapentin '300mg'$  TID  #Deconditioning. PT/OT recommend home health with 24hr assistance which is unavailable at patient's current facility. Working on [place] for SNF, family choosing facility  Dispo: Anticipated discharge approximately 1-2 days pending SNF placement  This is a Careers information officer Note.  The care of the patient was discussed with Dr. Isac Sarna and the assessment and plan formulated with their assistance.  Please see their attached note for official documentation of the daily encounter.   LOS: 7 days   Toy Cookey, Medical Student 02/23/2018, 12:01 PM

## 2018-02-23 NOTE — Progress Notes (Signed)
OT Cancellation Note  Patient Details Name: Casey Gates MRN: [id number] DOB: 05-13-1926   Cancelled Treatment:     OT made multiple attempts to provide intervention to pt but each time multiple clinicians present in room. OT will attempt treatment again on 02/24/18.  Gypsy Decant 02/23/2018, 4:25 PM

## 2018-02-23 NOTE — Clinical Social Work Note (Signed)
Clinical Social Work Assessment  Patient Details  Name: Casey Gates MRN: [id number] Date of Birth: November 26, 1926  Date of referral:  02/23/18               Reason for consult:  Facility Placement                Permission sought to share information with:  Facility Sport and exercise psychologist, Family Supports Permission granted to share information::  Yes, Verbal Permission Granted  Name::     Darra Lis::  SNFs  Relationship::  Son  Contact Information:  [phone]  Housing/Transportation Living arrangements for the past 2 months:  [place] of Information:  Patient, Adult Children Patient Interpreter Needed:  None Criminal Activity/Legal Involvement Pertinent to Current Situation/Hospitalization:  No - Comment as needed Significant Relationships:  Adult Children Lives with:  Facility Resident Do you feel safe going back to the place where you live?  No Need for family participation in patient care:  No (Coment)  Care giving concerns:  CSW received consult for possible SNF placement at time of discharge. CSW spoke with patient and his son regarding PT recommendation of SNF placement at time of discharge. Patient reported that his [place] at [facility] is not adequate support. Patient expressed understanding of PT recommendation and is agreeable to SNF placement at time of discharge. CSW to continue to follow and assist with discharge planning needs.   Social Worker assessment / plan:  CSW spoke with patient concerning possibility of rehab at [facility] before returning home.  Employment status:  Retired Nurse, adult PT Recommendations:  [place] / Referral to community resources:  [place]  Patient/Family's Response to care:  Patient recognizes need for rehab before returning home and is agreeable to a SNF in [place]. Patient's son reported preference for  [facility] because he has heard of it before, but will let CSW know when he arrives at the hospital.  Patient/Family's Understanding of and Emotional Response to Diagnosis, Current Treatment, and Prognosis:  Patient/family is realistic regarding therapy needs and expressed being hopeful for SNF placement. Patient expressed understanding of CSW role and discharge process as well as medical condition. No questions/concerns about plan or treatment.    Emotional Assessment Appearance:  Appears stated age Attitude/Demeanor/Rapport:  Engaged, Gracious Affect (typically observed):  Pleasant Orientation:  Oriented to Self, Oriented to Situation, Oriented to Place, Oriented to  Time Alcohol / Substance use:  Not Applicable Psych involvement (Current and /or in the community):  No (Comment)  Discharge Needs  Concerns to be addressed:  Care Coordination Readmission within the last 30 days:  No Current discharge risk:  None Barriers to Discharge:  No Barriers Identified   Benard Halsted, [place] 02/23/2018, 11:07 AM

## 2018-02-23 NOTE — Consult Note (Addendum)
Reason for Consult: GIST tumor Referring Physician: [facility] PCP:  Velna Hatchet, MD  CC: 2-day history of constipation; with bloody stools, on laxatives   Clifford Benninger is an 84 y.o. male.   HPI: Patient is an 84 year old male who has problems with constipation secondary to opioid medications for chronic pain.  He reports taking a laxative and having frequent black tarry diarrhea, diffuse abdominal pain and some dizziness.  In the ED he developed nausea and vomiting with with some bloody emesis.  Workup in the ED shows his initial blood pressure was 69/50.,  Heart rate was 81-94.  CMP shows glucose of 134, CO2 of 19, LFTs were normal.  WBC 17.2, hemoglobin 7.7, hematocrit 24, platelets 188,000.  He was transfused with 3 units of packed cells.  Fecal Hemoccult was positive.  He was admitted by medicine and seen in consultation by GI Dr. [name].  EGD done on 02/16/18: A 5 cm hiatal hernia, widely patent Schatzki's rings, clotted blood in the gastric body gastric tumor on the lesser curvature of the stomach was injected clips were placed.  Normal duodenum.  Pathology of the tumor shows from CT-guided core biopsy of the gastric mass by IR on 3/21 shows:  Soft Tissue Needle Core Biopsy, gastric- FINDINGS CONSISTENT WITH GASTROINTESTINAL STROMAL TUMOR (GIST).   CT scan obtained on 02/17/18 shows a 9.7 x 8.8 x 10.9 cm mass lesser curvature of the distal stomach .  We are asked to see. Marland Kitchen  Past Medical History:  Diagnosis Date  . Asthma    AS A CHILD  . Blind left eye   . Cancer [facility])    PROSTATE CANCER  . Cellulitis    frequently, "all over"  . Chronic kidney disease   . Chronic pain syndrome   . Constipation   . DDD (degenerative disc disease), lumbar   . GI bleeding   . Insomnia   . Kyphoscoliosis   . Mass in the abdomen 01/2018  . Restless leg     Past Surgical History:  Procedure Laterality Date  . CHOLECYSTECTOMY    . COLONOSCOPY    . ESOPHAGOGASTRODUODENOSCOPY (EGD) WITH  PROPOFOL Left 02/16/2018   Procedure: ESOPHAGOGASTRODUODENOSCOPY (EGD) WITH PROPOFOL;  Surgeon: [name], MD;  Location: [facility];  Service: Gastroenterology;  Laterality: Left;  . PROSTATECTOMY    . QUADRICEPS TENDON REPAIR Left 07/07/2017   Procedure: REPAIR QUADRICEP TENDON;  Surgeon: Rod Can, MD;  Location: [facility];  Service: Orthopedics;  Laterality: Left;  . RIGHT CATARACT EXTRACTION  2013  . RIGTH NEPHRECTOMY  2006  . TONSILLECTOMY    . TRANSURETHRAL RESECTION OF PROSTATE  1997    Family History  Problem Relation Age of Onset  . Bone cancer Mother   . Hypertension Mother   . Arthritis Mother     Social History:  reports that he has never smoked. He has never used smokeless tobacco. He reports that he does not drink alcohol or use drugs.  Allergies:  Allergies  Allergen Reactions  . Peanuts [Peanut Oil] Shortness Of Breath    Shortness of breath---all nuts, but can have peanuts, cashews, and almonds  . Coconut Flavor [Flavoring Agent]     Anything that is related to coconut  . Coconut Oil Other (See Comments)    Unknown childhood reaction.      Medications:  Prior to Admission:  Medications Prior to Admission  Medication Sig Dispense Refill Last Dose  . aspirin EC 81 MG tablet Take 1 tablet (81  mg total) by mouth daily. 90 tablet 3 02/15/2018 at Unknown time  . gabapentin (NEURONTIN) 300 MG capsule Take 300 mg by mouth 2 (two) times daily. (0900 & 2100)   02/15/2018 at Unknown time  . mirtazapine (REMERON) 7.5 MG tablet Take 7.5 mg by mouth at bedtime.   02/15/2018 at Unknown time  . MOVANTIK 25 MG TABS tablet Take 25 mg by mouth daily.  6 02/15/2018 at Unknown time  . oxymorphone (OPANA) 10 MG tablet Take 10 mg by mouth every 6 (six) hours as needed for pain.    02/15/2018 at Unknown time  . polyethylene glycol (MIRALAX / GLYCOLAX) packet Take 17 g by mouth 2 (two) times daily. 14 each 0 02/15/2018 at Unknown time   Continuous:  Anti-infectives (From admission,  onward)   Start     Dose/Rate Route Frequency Ordered Stop   02/16/18 1600  erythromycin 1,000 mg in sodium chloride 0.9 % 250 mL IVPB     1,000 mg 250 mL/hr over 60 Minutes Intravenous To Short Stay 02/16/18 1440 02/17/18 1600      Results for orders placed or performed during the hospital encounter of 02/16/18 (from the past 48 hour(s))  Hemoglobin and hematocrit, blood     Status: Abnormal   Collection Time: 02/21/18  8:32 PM  Result Value Ref Range   Hemoglobin 9.8 (L) 13.0 - 17.0 g/dL   HCT 31.0 (L) 39.0 - 52.0 %    Comment: Performed at [facility], [place] [address] [place], [place] [address]  CBC     Status: Abnormal   Collection Time: 02/22/18  4:48 AM  Result Value Ref Range   WBC 10.4 4.0 - 10.5 K/uL   RBC 3.05 (L) 4.22 - 5.81 MIL/uL   Hemoglobin 9.0 (L) 13.0 - 17.0 g/dL   HCT 28.3 (L) 39.0 - 52.0 %   MCV 92.8 78.0 - 100.0 fL   MCH 29.5 26.0 - 34.0 pg   MCHC 31.8 30.0 - 36.0 g/dL   RDW 15.7 (H) 11.5 - 15.5 %   Platelets 277 150 - 400 K/uL    Comment: Performed at [facility], [place] [address], [place], [place] [address]  Basic metabolic panel     Status: Abnormal   Collection Time: 02/22/18  4:48 AM  Result Value Ref Range   Sodium 139 135 - 145 mmol/L   Potassium 4.1 3.5 - 5.1 mmol/L    Comment: DELTA CHECK NOTED   Chloride 114 (H) 101 - 111 mmol/L   CO2 21 (L) 22 - 32 mmol/L   Glucose, Bld 94 65 - 99 mg/dL   BUN 7 6 - 20 mg/dL   Creatinine, Ser 0.81 0.61 - 1.24 mg/dL   Calcium 7.7 (L) 8.9 - 10.3 mg/dL   GFR calc non Af Amer >60 >60 mL/min   GFR calc Af Amer >60 >60 mL/min    Comment: (NOTE) The eGFR has been calculated using the CKD EPI equation. This calculation has not been validated in all clinical situations. eGFR's persistently <60 mL/min signify possible Chronic Kidney Disease.    Anion gap 4 (L) 5 - 15    Comment: Performed at [facility] [address] [place], [place] [address]  Magnesium     Status: None   Collection Time:  02/22/18  4:48 AM  Result Value Ref Range   Magnesium 1.9 1.7 - 2.4 mg/dL    Comment: Performed at [facility]  [address] [place], [place] [address]  CBC     Status: Abnormal   Collection Time: 02/23/18  6:46 AM  Result Value Ref Range   WBC 10.9 (H) 4.0 - 10.5 K/uL   RBC 3.15 (L) 4.22 - 5.81 MIL/uL   Hemoglobin 9.4 (L) 13.0 - 17.0 g/dL   HCT 29.4 (L) 39.0 - 52.0 %   MCV 93.3 78.0 - 100.0 fL   MCH 29.8 26.0 - 34.0 pg   MCHC 32.0 30.0 - 36.0 g/dL   RDW 15.5 11.5 - 15.5 %   Platelets 299 150 - 400 K/uL    Comment: Performed at [facility], [place] [address], [place], [place] [address]  Basic metabolic panel     Status: Abnormal   Collection Time: 02/23/18  6:46 AM  Result Value Ref Range   Sodium 140 135 - 145 mmol/L   Potassium 3.6 3.5 - 5.1 mmol/L   Chloride 110 101 - 111 mmol/L   CO2 23 22 - 32 mmol/L   Glucose, Bld 100 (H) 65 - 99 mg/dL   BUN 6 6 - 20 mg/dL   Creatinine, Ser 0.86 0.61 - 1.24 mg/dL   Calcium 7.8 (L) 8.9 - 10.3 mg/dL   GFR calc non Af Amer >60 >60 mL/min   GFR calc Af Amer >60 >60 mL/min    Comment: (NOTE) The eGFR has been calculated using the CKD EPI equation. This calculation has not been validated in all clinical situations. eGFR's persistently <60 mL/min signify possible Chronic Kidney Disease.    Anion gap 7 5 - 15    Comment: Performed at [facility] [address], [place], [place] [address]  Magnesium     Status: None   Collection Time: 02/23/18  6:46 AM  Result Value Ref Range   Magnesium 2.0 1.7 - 2.4 mg/dL    Comment: Performed at [facility] [address], [place], [place] [address]    No results found.  Review of Systems  Constitutional: Positive for malaise/fatigue and weight loss (Patient does not think he is lost weight because he is not been exercising.).  HENT: Negative.   Eyes: Negative.   Respiratory: Negative.   Cardiovascular: Negative.  Negative for leg swelling.  Gastrointestinal: Positive  for abdominal pain, blood in stool, constipation, melena, nausea (IN ED) and vomiting (in ED).  Genitourinary:       Foley catheter in now  Musculoskeletal: Positive for back pain and joint pain.       Chronic pain for years Dr. [facility]el[facility] pain clinic  Skin: Positive for rash.       Thin fragile skin  Neurological: Negative.   Endo/Heme/Allergies: Bruises/bleeds easily.  Psychiatric/Behavioral: Positive for depression, memory loss and substance abuse (Pt says he was a heavy drinker till age 31 when he quit). The patient is nervous/anxious.    Blood pressure 107/61, pulse 75, temperature 98.2 F (36.8 C), temperature source Oral, resp. rate 18, height 6' (1.829 m), weight 68 kg (150 lb), SpO2 97 %. Physical Exam  Constitutional: He is oriented to person, place, and time. No distress.  Elderly frail white male in no acute distress, but somewhat anxious.  HENT:  Head: Normocephalic and atraumatic.  Mouth/Throat: No oropharyngeal exudate.  Eyes: Right eye exhibits no discharge. Left eye exhibits no discharge. No scleral icterus.  Pupils are equal  Neck: Normal range of motion. Neck supple. No JVD present. No tracheal deviation present. No thyromegaly present.  Cardiovascular: Regular rhythm and  normal heart sounds.  No murmur heard. Slightly tachycardic right now I did not feel distal pulses.  Respiratory: Effort normal and breath sounds normal. No respiratory distress. He has no wheezes. He has no rales. He exhibits no tenderness.  GI: Soft. Bowel sounds are normal. He exhibits no distension and no mass. There is no tenderness. There is no rebound and no guarding.  He has a midline surgical scar and a right flank nephrectomy scar.  He cannot really remember what surgeries he has had in the past.  Genitourinary:  Genitourinary Comments: Foley in place with some penile edema  Musculoskeletal: He exhibits no edema or tenderness.  Lymphadenopathy:    He has no cervical adenopathy.   Neurological: He is alert and oriented to person, place, and time. No cranial nerve deficit.  Skin: Skin is warm and dry. No rash noted. He is not diaphoretic. No erythema. There is pallor.  Psychiatric: He has a normal mood and affect. His behavior is normal. Judgment and thought content normal.    Assessment/Plan: GI bleed with 9.7 x 8.8 x 10.9 cm gastric mass - GIST Tumor Chronic pain back and joints History of prostate cancer Chronic pain -back and joints -on Opana/morphine 10 mg every 6 hours p.o. Acute anemia Restless leg syndrome History of prostate cancer/TURP/acute urinary retention Obstructive sleep apnea/asthma Chronic constipation Chronic kidney disease Malnutrition/deconditioning  FEN: Regular diet ID: None DVT: SCDs Foley: In Follow-up: In hospital Dr. Dalbert Batman will follow this week.  He has an appointment at our office with Dr. Barry Dienes on 03/03/18 at 9:15 AM.  Plan: Patient is hemodynamically stable currently.  Hemoglobin and hematocrit are stable.  He has been seen in consultation by Dr. Fanny Skates.  Dr. Dalbert Batman has reviewed studies with Dr. Stark Klein.  Patient is not bleeding at this time.  He is anxious and would like to go forward with resection of the gastric mass.  Our plan currently is that if he remains stable, he can be transferred to the skilled nursing facility.  He has an appointment with Dr. Barry Dienes on 03/03/18 at 9:15 AM.  If he has a recurrence of his bleeding between now and discharge Dr. Dalbert Batman will follow up and take him to the OR for any emergent intervention.  He would need medical clearance for surgery.  His admission EKG shows a left bundle branch block and a left anterior fascicular block.  Would consider cardiology consult.  He had a CT of the chest on 12/30/17 that was negative for pulmonary embolus he did have a mild dilated ascending thoracic aneurysm 4.3 cm.  We will follow with you until discharge.  I have also ordered pre-albumin for tomorrow and  would recommend a nutrition consult for his malnutrition and deconditioning.  He has PT and OT ordered already.  Janautica Netzley 02/23/2018, 2:50 PM

## 2018-02-23 NOTE — Progress Notes (Signed)
Patient's son has chosen [employer]. They have received insurance authorization.   Percell Locus Jenya Putz LCSW [phone]

## 2018-02-23 NOTE — Clinical Social Work Placement (Signed)
   CLINICAL SOCIAL WORK PLACEMENT  NOTE  Date:  02/23/2018  Patient Details  Name: Casey Gates MRN: [id number] Date of Birth: 1926-07-04  Clinical Social Work is seeking post-discharge placement for this patient at the [place] level of care (*CSW will initial, date and re-position this form in  chart as items are completed):  Yes   Patient/family provided with [facility] Work Department's list of facilities offering this level of care within the geographic area requested by the patient (or if unable, by the patient's family).  Yes   Patient/family informed of their freedom to choose among providers that offer the needed level of care, that participate in Medicare, Medicaid or managed care program needed by the patient, have an available bed and are willing to accept the patient.  Yes   Patient/family informed of 's ownership interest in [facility] and [facility], as well as of the fact that they are under no obligation to receive care at these facilities.  PASRR submitted to EDS on       PASRR number received on       Existing PASRR number confirmed on 02/23/18     FL2 transmitted to all facilities in geographic area requested by pt/family on 02/23/18     FL2 transmitted to all facilities within larger geographic area on       Patient informed that his/her managed care company has contracts with or will negotiate with certain facilities, including the following:        Yes   Patient/family informed of bed offers received.  Patient chooses bed at [facility]     Physician recommends and patient chooses bed at      Patient to be transferred to [facility] on 02/23/18.  Patient to be transferred to facility by [facility]     Patient family notified on 02/23/18 of transfer.  Name of family member notified:  Son     PHYSICIAN Please sign FL2, Please prepare priority discharge summary, including medications     Additional  Comment:    _______________________________________________ Benard Halsted, [place] 02/23/2018, 1:09 PM

## 2018-02-24 DIAGNOSIS — R339 Retention of urine, unspecified: Secondary | ICD-10-CM

## 2018-02-24 DIAGNOSIS — C49A2 Gastrointestinal stromal tumor of stomach: Principal | ICD-10-CM

## 2018-02-24 LAB — CBC
HCT: 29.6 % — ABNORMAL LOW (ref 39.0–52.0)
Hemoglobin: 9.2 g/dL — ABNORMAL LOW (ref 13.0–17.0)
MCH: 29 pg (ref 26.0–34.0)
MCHC: 31.1 g/dL (ref 30.0–36.0)
MCV: 93.4 fL (ref 78.0–100.0)
Platelets: 319 10*3/uL (ref 150–400)
RBC: 3.17 MIL/uL — ABNORMAL LOW (ref 4.22–5.81)
RDW: 15.3 % (ref 11.5–15.5)
WBC: 10.4 10*3/uL (ref 4.0–10.5)

## 2018-02-24 LAB — COMPREHENSIVE METABOLIC PANEL
ALT: 15 U/L — AB (ref 17–63)
AST: 20 U/L (ref 15–41)
Albumin: 2.3 g/dL — ABNORMAL LOW (ref 3.5–5.0)
Alkaline Phosphatase: 62 U/L (ref 38–126)
Anion gap: 6 (ref 5–15)
BUN: 7 mg/dL (ref 6–20)
CO2: 25 mmol/L (ref 22–32)
CREATININE: 0.86 mg/dL (ref 0.61–1.24)
Calcium: 8.1 mg/dL — ABNORMAL LOW (ref 8.9–10.3)
Chloride: 108 mmol/L (ref 101–111)
GFR calc Af Amer: >60 mL/min (ref >60)
Glucose, Bld: 98 mg/dL (ref 65–99)
Potassium: 4 mmol/L (ref 3.5–5.1)
Sodium: 139 mmol/L (ref 135–145)
TOTAL PROTEIN: 4.2 g/dL — AB (ref 6.5–8.1)
Total Bilirubin: 0.6 mg/dL (ref 0.3–1.2)

## 2018-02-24 LAB — PREALBUMIN: Prealbumin: 12.9 mg/dL — ABNORMAL LOW (ref 18–38)

## 2018-02-24 MED ORDER — GABAPENTIN 300 MG PO CAPS: 300.0000 mg | ORAL_CAPSULE | Freq: Three times a day (TID) | ORAL | 0 refills | Status: DC

## 2018-02-24 MED ORDER — OXYMORPHONE HCL 10 MG PO TABS: 10.0000 mg | ORAL_TABLET | Freq: Four times a day (QID) | ORAL | 0 refills | Status: DC | PRN

## 2018-02-24 NOTE — Progress Notes (Signed)
Subjective: Casey Gates is an 84 y.o. Male who presented with melena x 2 and significant hemoglobin drop. Patient reports feeling well and voiding appropriately on his own. BM at ~0600, reported to be normal consistency and color. Patient feels fine about leaving the hospital to the SNF facility [facility]). Overall stable. Reviewed surgical plan with patient who is in agreement.   Objective: Vital signs in last 24 hours: Vitals:   02/23/18 1931 02/23/18 2055 02/24/18 0500 02/24/18 0525  BP:  (!) 110/58  (!) 118/55  Pulse:  85  69  Resp:  16  16  Temp:  99.8 F (37.7 C)  98 F (36.7 C)  TempSrc:  Oral  Oral  SpO2: 95% 97%  94%  Weight:   165 lb 9.1 oz (75.1 kg)   Height:       Weight change:   Intake/Output Summary (Last 24 hours) at 02/24/2018 1321 Last data filed at 02/24/2018 0900 Gross per 24 hour  Intake 358 ml  Output 625 ml  Net -267 ml   BP (!) 118/55 (BP Location: Left Arm)   Pulse 69   Temp 98 F (36.7 C) (Oral)   Resp 16   Ht 6' (1.829 m)   Wt 165 lb 9.1 oz (75.1 kg)   SpO2 94%   BMI 22.45 kg/m   General Appearance:    Alert, cooperative, no distress, appears stated age  Head:    Normocephalic, without obvious abnormality, atraumatic  Lungs:     Clear to auscultation bilaterally, respirations unlabored  Chest wall:    No tenderness or deformity  Heart:    Regular rate and rhythm, S1 and S2 normal, no murmur, rub   or gallop  Abdomen:     Soft, non-tender, bowel sounds active all four quadrants,    no masses, no organomegaly  Extremities:   Extremities normal, atraumatic, no cyanosis or edema  Pulses:   2+ and symmetric all extremities   Lab Results: Recent Results  Prealbumin     Status: Abnormal   Collection Time: 02/24/18  5:26 AM  Result Value Ref Range   Prealbumin 12.9 (L) 18 - 38 mg/dL    Comment: Performed at [facility], [address] [place], [place] [address]  CBC     Status: Abnormal   Collection Time: 02/24/18  5:26 AM  Result Value  Ref Range   WBC 10.4 4.0 - 10.5 K/uL   RBC 3.17 (L) 4.22 - 5.81 MIL/uL   Hemoglobin 9.2 (L) 13.0 - 17.0 g/dL   HCT 29.6 (L) 39.0 - 52.0 %   MCV 93.4 78.0 - 100.0 fL   MCH 29.0 26.0 - 34.0 pg   MCHC 31.1 30.0 - 36.0 g/dL   RDW 15.3 11.5 - 15.5 %   Platelets 319 150 - 400 K/uL    Comment: Performed at [facility], [place] [address], [place], [place] [address]  Comprehensive metabolic panel     Status: Abnormal   Collection Time: 02/24/18  5:26 AM  Result Value Ref Range   Sodium 139 135 - 145 mmol/L   Potassium 4.0 3.5 - 5.1 mmol/L   Chloride 108 101 - 111 mmol/L   CO2 25 22 - 32 mmol/L   Glucose, Bld 98 65 - 99 mg/dL   BUN 7 6 - 20 mg/dL   Creatinine, Ser 0.86 0.61 - 1.24 mg/dL   Calcium 8.1 (L) 8.9 - 10.3 mg/dL   Total Protein 4.2 (L) 6.5 - 8.1  g/dL   Albumin 2.3 (L) 3.5 - 5.0 g/dL   AST 20 15 - 41 U/L   ALT 15 (L) 17 - 63 U/L   Alkaline Phosphatase 62 38 - 126 U/L   Total Bilirubin 0.6 0.3 - 1.2 mg/dL   GFR calc non Af Amer >60 >60 mL/min   GFR calc Af Amer >60 >60 mL/min    Comment: (NOTE) The eGFR has been calculated using the CKD EPI equation. This calculation has not been validated in all clinical situations. eGFR's persistently <60 mL/min signify possible Chronic Kidney Disease.    Anion gap 6 5 - 15    Comment: Performed at [facility] [address], [place], [place] [address]    Micro Results: No results found for this or any previous visit (from the past 240 hour(s)). Studies/Results: No results found. Medications: I have reviewed the patient's current medications. Scheduled Meds: . gabapentin  300 mg Oral TID  . lidocaine  1 application Urethral Once   Continuous Infusions: PRN Meds:.ipratropium-albuterol, morphine injection, polyethylene glycol, polyvinyl alcohol, senna, sodium chloride Assessment/Plan: Principal Problem:   Acute GI bleeding Active Problems:   Chronic pain   Acute blood loss anemia   Gastric mass   Restless leg  syndrome   Gastrointestinal stromal tumor (GIST) of stomach (HCC)   Urinary retention  Casey Gates is a 84 y.o. Male who presented with melena x 2 and a 7 pt hemoglobin drop, found to have a gastric mass on CT. Biopsy showed gastrointestinal stromal tumor.  #GI Bleed 2/2 gastric mass with submucosal ulceration. Patient found to have gastric mass on CT abd/pelvis. CT-guided biopsy performed on 3/21, which showed gastrointestinal stromal tumor (GIST). Dr. Dalbert Batman met with patient and has considered emergent surgery if ongoing bleeding prior to appointment with Dr. Barry Dienes who will see patient on 04/02 for surgery consult.  - D/c to SNF [facility]) today - Continue PT/OT  - Stop home aspirin on d/c  #Wheezing. Patient has no h/x of asthma or COPD. Advised to use incentive spirometer q1hr while awake. Patient demonstrated correct usage. No wheezing on exam today. - Continue to monitor   #Acute Urinary Retention. H/x of Prostate Cancer s/p TURP. No h/x of urinary retention. Renal function stable. Coude catheter removed last night. Pt voiding spontaneously without problems. - Continue to monitor I/Os  #Chronic Pain. Patient has chronic back and joint pains. Takes Oxymorphone at home. - Morphine 1 mg q4hrs  #Restless Leg Syndrome - Continue Gabapentin '300mg'$  TID  Dispo: Anticipated discharge today  This is a Careers information officer Note.  The care of the patient was discussed with Dr. Isac Sarna and the assessment and plan formulated with their assistance.  Please see their attached note for official documentation of the daily encounter.   LOS: 8 days   Toy Cookey, Medical Student 02/24/2018, 1:21 PM

## 2018-02-24 NOTE — Progress Notes (Signed)
   Subjective:  No acute events overnight. Patient reports he feels well today. He had one bowel movement this morning that he described as normal. Denies any other complaints this morning. Discussed with patient his blood remains stable and he is medically stable for discharge to SNF. Patient was seen by surgery this AM and stated he is aware he has a follow up appointment with Dr. Barry Dienes on 4/2. All questions answered.   Objective:  Vital signs in last 24 hours: Vitals:   02/23/18 1931 02/23/18 2055 02/24/18 0500 02/24/18 0525  BP:  (!) 110/58  (!) 118/55  Pulse:  85  69  Resp:  16  16  Temp:  99.8 F (37.7 C)  98 F (36.7 C)  TempSrc:  Oral  Oral  SpO2: 95% 97%  94%  Weight:   165 lb 9.1 oz (75.1 kg)   Height:       Physical Exam  Constitutional: He is oriented to person, place, and time. He appears well-developed and well-nourished. No distress.  Elderly male lying comfortably in bed in no acute distress   Cardiovascular: Normal rate, regular rhythm and normal heart sounds. Exam reveals no gallop and no friction rub.  No murmur heard. Pulmonary/Chest: Effort normal. No respiratory distress.  Abdominal: Soft. Bowel sounds are normal. He exhibits no distension. There is no tenderness.  Musculoskeletal: He exhibits no edema.  Neurological: He is alert and oriented to person, place, and time.   Assessment/Plan:  Principal Problem:   Acute GI bleeding Active Problems:   Chronic pain   Acute blood loss anemia   Gastric mass   Restless leg syndrome   Gastrointestinal stromal tumor (GIST) of stomach (HCC)   Urinary retention  # GI bleed2/2 gastric mass with submucosal ulceration:Patientpresented with 2-day history of multiple episodes of melena and found to have a gastric mass and lesser curvature on EGDassociated with ulceration concerning for malignancy. CT abdomen/pelvis showed gastric mass. CT-guided core biopsy performed on 3/21 which showed GIST. Surgery consulted who  recommended surgery consult. Per Dr. Dalbert Batman (surgery), plan for resection with Dr. Barry Dienes vs surgery by Dr. Dalbert Batman if patient rebleeds this week. He has an appt with Dr. Barry Dienes on 4/2.  Hgb remains stable ~9 and he is hemodynamically stable.  - Surgery consult, appreciate assistance and recommendations  - Will stop home aspirin on discharge  # Wheezing: No asthma or COPD history. Advised to use incentive spirometer q1hr while awake - patient demonstrated correct usage. Respiratory status stable. Appears comfortable on room air.   # Acute urinary retention: Hx of prostate cancer s/p TURP. No prior history of retention. Renal function stable and normal. Suspect medication side effect. Coude catheter removed yesterday and patient required I/O cath x1. Has been able to void multiple times this morning.   -Continue to monitor I/Os  # Chronic pain: Patient has chronic back and joint pains for which he takes Oxymorphone at home. - Morphine 1 mg q4 prn  # Restless leg syndrome: - Continue gabapentin to 300 mg TID  # Deconditioning: PT/OT recommend HH PT/OT if 24hr assistance is available which patient and son state is not available at his ALF. Working on SNF placement.  - SW consult for SNF, anticipate discharge home today    Dispo: Anticipated discharge in approximately today day(s).   Welford Roche, MD 02/24/2018, 10:27 AM ZCHYI:[phone]

## 2018-02-24 NOTE — Progress Notes (Signed)
Occupational Therapy Treatment Patient Details Name: Casey Gates MRN: [id number] DOB: 23-Sep-1926 Today's Date: 02/24/2018    History of present illness Casey Gates is an 84yo male who comes to [facility] after noted melena, decreased apetite, and hematemsis x1 in the ED. While admitted, noted to have a large growth in stomach, now s/p Bx, awaiting results PMH includes: asthma, OSA, PrCA, chronic low back pain, Lt eye blindness, Left quads tendon rupture s/p repair, zero falls in past 3 months.    OT comments  Pt progressing towards OT goals. Completing squat pivot transfer to/from [facility] this session with MinA and requiring maxA for toileting ADLs. Pt continues to demonstrated decreased activity tolerance and generalized weakness. POC remains appropriate at this time. Will continue to follow to progress pt towards established OT goals.   Follow Up Recommendations  Home health OT;Supervision/Assistance - 24 hour(if 24hr not available, will need SNF)    Equipment Recommendations  None recommended by OT    Recommendations for Other Services Other (comment)(palliative)    Precautions / Restrictions Precautions Precautions: Fall Restrictions Weight Bearing Restrictions: No       Mobility Bed Mobility Overal bed mobility: Needs Assistance Bed Mobility: Supine to Sit;Sit to Supine     Supine to sit: Supervision Sit to supine: Supervision   General bed mobility comments: supervision for safety   Transfers Overall transfer level: Needs assistance Equipment used: 1 person hand held assist Transfers: Stand Pivot Transfers Sit to Stand: Min guard Stand pivot transfers: Min assist       General transfer comment: pt with significant postural limitations and foward flexed posture during transfer; transfers EOB<>BSC     Balance Overall balance assessment: Needs assistance Sitting-balance support: Bilateral upper extremity supported;Feet supported Sitting balance-Leahy Scale: Good       Standing balance-Leahy Scale: Poor Standing balance comment: reliant on UE support                            ADL either performed or assessed with clinical judgement   ADL Overall ADL's : Needs assistance/impaired                         Toilet Transfer: Minimal assistance;Stand-pivot;BSC   Toileting- Clothing Manipulation and Hygiene: Maximal assistance;Sit to/from stand Toileting - Clothing Manipulation Details (indicate cue type and reason): pt requires assist for pericare after BM      Functional mobility during ADLs: Minimal assistance                         Cognition Arousal/Alertness: Awake/alert Behavior During Therapy: WFL for tasks assessed/performed Overall Cognitive Status: Within Functional Limits for tasks assessed                                                            Pertinent Vitals/ Pain       Pain Assessment: No/denies pain  Frequency  Min 2X/week        Progress Toward Goals  OT Goals(current goals can now be found in the care plan section)  Progress towards OT goals: Progressing toward goals  Acute Rehab OT Goals Patient Stated Goal: regain independence for home living OT Goal Formulation: With patient Time For Goal Achievement: 03/07/18 Potential to Achieve Goals: Good  Plan Discharge plan remains appropriate                     AM-PAC PT "6 Clicks" Daily Activity     Outcome Measure   Help from another person eating meals?: None Help from another person taking care of personal grooming?: None(in sitting) Help from another person toileting, which includes using toliet, bedpan, or urinal?: A Lot Help from another person bathing (including washing, rinsing, drying)?: A Lot Help from another person to put on and taking off regular upper body clothing?: A Little Help from another person to  put on and taking off regular lower body clothing?: A Lot 6 Click Score: 17    End of Session    OT Visit Diagnosis: Unsteadiness on feet (R26.81);Other abnormalities of gait and mobility (R26.89);Muscle weakness (generalized) (M62.81)   Activity Tolerance Patient tolerated treatment well   Patient Left in bed;with call bell/phone within reach;with nursing/sitter in room   Nurse Communication Mobility status        Time: 9528-4132 OT Time Calculation (min): 16 min  Charges: OT General Charges $OT Visit: 1 Visit OT Treatments $Self Care/Home Management : 8-22 mins  Lou Cal, OT Pager [phone] 02/24/2018    [name] 02/24/2018, 10:47 AM

## 2018-02-24 NOTE — Clinical Social Work Placement (Signed)
   CLINICAL SOCIAL WORK PLACEMENT  NOTE  Date:  02/24/2018  Patient Details  Name: Casey Gates MRN: [id number] Date of Birth: [birth year]  Clinical Social Work is seeking post-discharge placement for this patient at the [place] level of care (*CSW will initial, date and re-position this form in  chart as items are completed):  Yes   Patient/family provided with [place] Work Department's list of facilities offering this level of care within the geographic area requested by the patient (or if unable, by the patient's family).  Yes   Patient/family informed of their freedom to choose among providers that offer the needed level of care, that participate in Medicare, Medicaid or managed care program needed by the patient, have an available bed and are willing to accept the patient.  Yes   Patient/family informed of 's ownership interest in [facility] and [facility], as well as of the fact that they are under no obligation to receive care at these facilities.  PASRR submitted to EDS on       PASRR number received on       Existing PASRR number confirmed on 02/23/18     FL2 transmitted to all facilities in geographic area requested by pt/family on 02/23/18     FL2 transmitted to all facilities within larger geographic area on       Patient informed that his/her managed care company has contracts with or will negotiate with certain facilities, including the following:        Yes   Patient/family informed of bed offers received.  Patient chooses bed at [facility]     Physician recommends and patient chooses bed at      Patient to be transferred to [facility] on 02/24/18.  Patient to be transferred to facility by [facility]     Patient family notified on 02/24/18 of transfer.  Name of family member notified:  Son     PHYSICIAN Please sign FL2, Please prepare priority discharge summary, including medications     Additional  Comment:    _______________________________________________ Benard Halsted, [place] 02/24/2018, 1:32 PM

## 2018-02-24 NOTE — Progress Notes (Signed)
Pt discharging to SNF, [facility].  Copy of D/C instructions given to transporters to place in pt information packet.   Pt d/c'd with belongings, transported by [facility]. Pt contacted his son to let him know he was leaving now.

## 2018-02-24 NOTE — Discharge Summary (Addendum)
Name: Casey Gates MRN: [id number] DOB: [birth year] 84 y.o. PCP: Velna Hatchet, MD  Date of Admission: 02/16/2018  6:32 AM Date of Discharge: 02/24/2018 Attending Physician: Lucious Groves, DO  Discharge Diagnosis: 1. Acute GI bleed  2. Gastrointestinal stromal tumor  3. Chronic pain  4. Restless leg syndrome  5. 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        Medication List    STOP taking these medications   aspirin EC 81 MG tablet   polyethylene glycol packet Commonly known as:  MIRALAX / GLYCOLAX     TAKE these medications   gabapentin 300 MG capsule Commonly known as:  NEURONTIN Take 1 capsule (300 mg total) by mouth 3 (three) times daily. (0900 & 2100) What changed:  when to take this   mirtazapine 7.5 MG tablet Commonly known as:  REMERON Take 7.5 mg by mouth at bedtime.   MOVANTIK 25 MG Tabs tablet Generic drug:  naloxegol oxalate Take 25 mg by mouth daily.   oxymorphone 10 MG tablet Commonly known as:  OPANA Take 10 mg by mouth every 6 (six) hours as needed for pain.       Disposition and follow-up:   [name] was discharged from [facility] in Stable condition.  At the hospital follow up visit please address:  1.  Please assess for ongoing hematemesis or melena as patient is at high risk for rebleeding. Please assess for urinary retention. Please ensure patient has follow up with Dr. Barry Dienes form  surgery.    2.  Labs / imaging needed at time of follow-up: CBC to check Hgb   3.  Pending labs/ test needing follow-up: None   Follow-up Appointments:  Contact information for follow-up providers    Stark Klein, MD Follow up on 03/03/2018.   Specialty:  General Surgery Why:  Your appointment is at 9:15 AM.  Be at the office 30 minutes early for check in.  Bring photo ID and insurance information.   Contact information: [address] [place] [address] [phone]            Contact information for after-discharge care    Destination    [facility] .   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 Acute GI bleed secondary to GIST tumor ulceration: Patient presented to the ED with a 2 day history of hematemesis and melena and was found to have a Hgb of 7.7 from a baseline  of 13-14. He received 2 units of pRBCs and GI consulted for emergent EGD given acuity of GI bleed. EGD showed an ulcerated gastric tumor on the lesser curvature of the stomach. CT abdomen/pelvis confirmed localization of the mass to the stomach. CT-guided core biopsy performed by IR which revealed a gastrointestinal stromal tumor (GIST). Surgery consulted (Dr. Dalbert Batman and Dr. Barry Dienes) who will plan for resection as an outpatient. Patient had several episodes of melena during admission requiring 1 more unit of blood after which his Hgb remained stable in the ~9s and he remains hemodynamically stable. He was discharged to SNF per PT/OT recommendations.    2. Chronic pain: Patient has a history of chronic lower back pain for which he takes oxymorphone at  home. He was treated with IV morphine PRN during this admission. His home oxymorphone was resumed on day of discharge. He is on scheduled Miralax at home which we recommend holding for now and resuming as needed for opioid-induced constipation.   3. Restless leg syndrome: Patient complained of bilateral feet pain worsened at night. Home gabapentin initially held in the setting of hemodynamic instability secondary to acute GI bleed. This medication was resumed once patient was hemodynamically stable with resolution on feet pain.   4. Urinary retention: Patient was noted to be retaining urine on HD#1. This was thought to be secondary to medication side effect as he did not have issues voiding in the past. Coude catheter placed as nursing staff was unable to advance catheter for I/O due to resistance and patient's pain. Catheter stayed in place until day prior to discharge were trial of void was performed. Patient voided without difficulty on day of discharge.    Discharge Vitals:   BP (!) 118/55 (BP Location: Left Arm)   Pulse 69   Temp 98 F (36.7 C) (Oral)   Resp 16   Ht 6' (1.829 m)   Wt 165 lb 9.1 oz (75.1 kg)   SpO2 94%   BMI 22.45 kg/m   Pertinent Labs, Studies, and Procedures:   CBC Latest Ref Rng & Units 02/24/2018 02/23/2018 02/22/2018  WBC 4.0 - 10.5 K/uL 10.4 10.9(H) 10.4  Hemoglobin 13.0 - 17.0 g/dL 9.2(L) 9.4(L) 9.0(L)  Hematocrit 39.0 - 52.0 % 29.6(L) 29.4(L) 28.3(L)  Platelets 150 - 400 K/uL 319 299 277   BMP Latest Ref Rng & Units 02/24/2018 02/23/2018 02/22/2018  Glucose 65 - 99 mg/dL 98 100(H) 94  BUN 6 - 20 mg/dL 7 6 7   Creatinine 0.61 - 1.24 mg/dL 0.86 0.86 0.81  Sodium 135 - 145 mmol/L 139 140 139  Potassium 3.5 - 5.1 mmol/L 4.0 3.6 4.1  Chloride 101 - 111 mmol/L 108 110 114(H)  CO2 22 - 32 mmol/L 25 23 21(L)  Calcium 8.9 - 10.3 mg/dL 8.1(L) 7.8(L) 7.7(L)    CT abdomen/pelvis 3/19: FINDINGS: Lower chest: Areas of cylindrical bronchiectasis, scarring and volume  loss in the lower lobes of the lungs bilaterally. Atherosclerosis in the left main, left anterior descending, left circumflex and right coronary arteries. Calcifications of the aortic valve.  Hepatobiliary: Gastric mass (discussed below) comes in close proximity to the undersurface of the left lobe of the liver, without definitive evidence of direct invasion. Liver has a slightly shrunken appearance and nodular contour, which could suggest mild cirrhosis. No discrete cystic or solid hepatic lesions. No intra or extrahepatic biliary ductal dilatation. Status post cholecystectomy.  Pancreas: No pancreatic mass. No pancreatic ductal dilatation. No pancreatic or peripancreatic fluid or inflammatory changes.  Spleen:  Calcified granuloma in the spleen.  Adrenals/Urinary Tract: Right kidney is either surgically or congenitally absent, or atrophic. 4.7 x 6.8 x 4.0 cm low-attenuation lesion in the lower pole of the left kidney is compatible with a large simple cyst. No hydroureteronephrosis. Urinary bladder is nearly completely decompressed with an indwelling Foley balloon catheter in place. 2.0 x 1.4 cm left adrenal nodule is incompletely characterized. Right adrenal gland is normal in appearance.  Stomach/Bowel: Along the lesser curvature of the distal stomach there is a large heterogeneously enhancing partially calcified gastric mass measuring 9.7 x 8.8 x 10.9 cm (axial image 34 of series 3 and coronal image 39 of series 6). Surgical clip extending into the gastric lumen from the left lateral margin of the mass, suggesting recent endoscopic biopsy. No pathologic dilatation of small bowel or colon. Normal appendix.  Vascular/Lymphatic: Aortic atherosclerosis, without evidence of aneurysm or dissection in the abdominal or pelvic vasculature. No lymphadenopathy noted in the abdomen or pelvis.  Reproductive: Prostate gland and seminal vesicles are unremarkable in  appearance.  Other: No significant volume of ascites.  No pneumoperitoneum.  Musculoskeletal: Severe levoscoliosis of the mid lumbar spine. There are no aggressive appearing lytic or blastic lesions noted in the visualized portions of the skeleton.   Discharge Instructions: Discharge Instructions    Call MD for:   Complete by:  As directed    Please return to the ED if you experience vomiting blood or start having many episodes of dark stools. Also if your fatigue or dizziness worsened.   Call MD for:  extreme fatigue   Complete by:  As directed    Call MD for:  persistant dizziness or light-headedness   Complete by:  As directed    Diet general   Complete by:  As directed    Discharge instructions   Complete by:  As directed    [name], Casey Gates were admitted to the hospital due to a severe GI bleed and you were found to have a mass in your stomach. Your blood counts were very low and you needed several units of blood while you were here. For your mass, you will need surgery. You have an appointment with Dr. Barry Dienes on 4/2 to discuss surgery further. If you experiencing similar symptoms (vomiting blood, dark stool, fatigue) prior to your appointment with Dr. Barry Dienes please return to the ED as you may need surgery more urgently.  Please make a hospital follow up appointment with your regular doctor within the next 1-2 weeks.   We increased the dose of your home gabapentin to 300 mg three times a day. You can continue taking your home oxymorphone as usual. We recommend you hold the Miralax for now as your are having regular BMs. You can resume it as needed when you feels constipated.   Please call us if you have any questions.   Increase activity slowly   Complete by:  As directed       Signed: Welford Roche, MD 02/24/2018, 10:37 AM   Pager: [phone]

## 2018-02-24 NOTE — Progress Notes (Signed)
Physical Therapy Treatment Patient Details Name: Casey Gates MRN: [id number] DOB: [birth year] Today's Date: 02/24/2018    History of Present Illness Casey Gates is an 84yo white male who comes to [facility] after noted melena, decreased apetite, and hematemsis x1 in the ED. While admitted, noted to have a large growth in stomach, now s/p Bx, awaiting results. H&H low and variabel whiel admitted, most recent, Hb: 10.2, HCT: 32.0. At baseline, pt lives at [place], uses a RW win his apartment with heavy AMB difficulty related to chronic low back pain and BLE weakness, and motorized WC/scooter outside of room.  PMH: asthma, OSA, PrCA, chronic low back pain, Lt eye blindness, Left quads tendon rupture s/p repair, zerop falls in past 3 months.     PT Comments    Pt is making slow progress towards his goals today, however is limited in his mobility by fear of diarrhea only agreeing to transfer from bed to recliner. Pt is currently supervision for bed mobility, and min guard for transfers and ambulation of 2 feet from bed to recliner. Pt will benefit from SNF level rehab at d/c to improve his strength and endurance with ambulation. PT will continue to follow acutely until d/c.   Follow Up Recommendations  SNF     Equipment Recommendations  None recommended by PT    Recommendations for Other Services       Precautions / Restrictions Precautions Precautions: Fall Restrictions Weight Bearing Restrictions: No    Mobility  Bed Mobility Overal bed mobility: Needs Assistance Bed Mobility: Supine to Sit     Supine to sit: Supervision Sit to supine: Supervision   General bed mobility comments: supervision for safety, requires increased time  Transfers Overall transfer level: Needs assistance Equipment used: 1 person hand held assist;Rolling walker (2 wheeled) Transfers: Stand Pivot Transfers;Sit to/from Stand Sit to Stand: Min guard(c RW) Stand pivot transfers: Min guard        General transfer comment: min guard with safety, unable to attain fully upright secondary to back pain  Ambulation/Gait Ambulation/Gait assistance: Min guard Ambulation Distance (Feet): 2 Feet Assistive device: Rolling walker (2 wheeled) Gait Pattern/deviations: Step-through pattern;Decreased step length - right;Decreased step length - left;Trunk flexed;Shuffle Gait velocity: slowed Gait velocity interpretation: Below normal speed for age/gender General Gait Details: pt with small, shuffling steps to transfer from bed to recliner, declines any further ambulation for fear of diarrhea     Balance Overall balance assessment: Needs assistance Sitting-balance support: Feet supported;No upper extremity supported Sitting balance-Leahy Scale: Fair     Standing balance support: Bilateral upper extremity supported;During functional activity Standing balance-Leahy Scale: Poor Standing balance comment: reliant on UE support                             Cognition Arousal/Alertness: Awake/alert Behavior During Therapy: WFL for tasks assessed/performed Overall Cognitive Status: Within Functional Limits for tasks assessed                                        Exercises General Exercises - Lower Extremity Ankle Circles/Pumps: AROM;Both;10 reps;Seated Long Arc Quad: AROM;Both;10 reps;Seated Hip Flexion/Marching: AROM;Both;10 reps;Seated        Pertinent Vitals/Pain Pain Assessment: Faces Faces Pain Scale: Hurts little more Pain Location: LBP with movement Pain Descriptors / Indicators: Grimacing;Guarding Pain Intervention(s): Limited activity within patient's tolerance;Monitored during session;Repositioned  PT Goals (current goals can now be found in the care plan section) Acute Rehab PT Goals Patient Stated Goal: regain independence for home living PT Goal Formulation: With patient Time For Goal Achievement: 03/06/18 Potential to Achieve  Goals: Fair Progress towards PT goals: Progressing toward goals    Frequency    Min 3X/week      PT Plan Discharge plan needs to be updated       AM-PAC PT "6 Clicks" Daily Activity  Outcome Measure  Difficulty turning over in bed (including adjusting bedclothes, sheets and blankets)?: A Lot Difficulty moving from lying on back to sitting on the side of the bed? : A Lot Difficulty sitting down on and standing up from a chair with arms (e.g., wheelchair, bedside commode, etc,.)?: A Lot Help needed moving to and from a bed to chair (including a wheelchair)?: A Little Help needed walking in hospital room?: A Little Help needed climbing 3-5 steps with a railing? : A Lot 6 Click Score: 14    End of Session Equipment Utilized During Treatment: Gait belt Activity Tolerance: Patient limited by pain Patient left: in chair;with family/visitor present;with call bell/phone within reach Nurse Communication: Mobility status PT Visit Diagnosis: Other abnormalities of gait and mobility (R26.89);Difficulty in walking, not elsewhere classified (R26.2);Dizziness and giddiness (R42)     Time: 9381-8299 PT Time Calculation (min) (ACUTE ONLY): 21 min  Charges:  $Gait Training: 8-22 mins                    G Codes:       Oline Belk B. Migdalia Dk PT, DPT Acute Rehabilitation  [phone] Pager [phone]     [place] 02/24/2018, 12:41 PM

## 2018-02-24 NOTE — Progress Notes (Signed)
Pt has had 3 BMs this am, this am he had 2 back to back loose stools, therapy working with pt at this time, reported pt had 1 BM then had to get back on [facility] to have another.  At 1200 pt had another BM, loose to watery staff reported, brown, no blood.   Dr Frederico Hamman notified, also informed her that pt had miralax on night of 3/23 and senokot on the am of 3/24.  Dr Frederico Hamman states pt takes miralax at home to help with constipation due to chronic opioid use at home.   No change in orders, no change in discharge.

## 2018-02-24 NOTE — Progress Notes (Signed)
Patient will DC to: [facility] Anticipated DC date: 02/24/18 Family notified: Son Transport by: Corey Harold   Per MD patient ready for DC to [facility]. RN, patient, patient's family, and facility notified of DC. Discharge Summary sent to facility. RN given number for report [phone]). DC packet on chart. Ambulance transport requested for patient.   CSW signing off.  Cedric Fishman, LCSW Clinical Social Worker [phone]

## 2018-02-24 NOTE — Progress Notes (Signed)
[facility] Progress Note  8 Days Post-Op  Subjective: CC- diarrhea Patient and son upset that they are likely being discharged today. States that he is still having loose stools and does not feel great. Denies abdominal pain. Denies n/v. Tolerating diet. Hemoglobin is stable and stools are not dark. VSS.  Objective: Vital signs in last 24 hours: Temp:  [98 F (36.7 C)-99.8 F (37.7 C)] 98 F (36.7 C) (03/26 0525) Pulse Rate:  [69-85] 69 (03/26 0525) Resp:  [16-18] 16 (03/26 0525) BP: (107-118)/(55-61) 118/55 (03/26 0525) SpO2:  [94 %-97 %] 94 % (03/26 0525) Weight:  [165 lb 9.1 oz (75.1 kg)] 165 lb 9.1 oz (75.1 kg) (03/26 0500) Last BM Date: 02/23/18  Intake/Output from previous day: 03/25 0701 - 03/26 0700 In: 120 [P.O.:120] Out: 625 [Urine:625] Intake/Output this shift: Total I/O In: 358 [P.O.:358] Out: -   PE: Gen:  Alert, NAD HEENT: EOM's intact, pupils equal and round Card:  RRR, no M/G/R heard Pulm:  CTAB, no W/R/R, effort normal Abd: Soft, NT/ND, +BS, no HSM, no hernia Ext:  Calves soft and nontender Psych: A&Ox3 Skin: no rashes noted, warm and dry  Lab Results:  Recent Labs    02/23/18 0646 02/24/18 0526  WBC 10.9* 10.4  HGB 9.4* 9.2*  HCT 29.4* 29.6*  PLT 299 319   BMET Recent Labs    02/23/18 0646 02/24/18 0526  NA 140 139  K 3.6 4.0  CL 110 108  CO2 23 25  GLUCOSE 100* 98  BUN 6 7  CREATININE 0.86 0.86  CALCIUM 7.8* 8.1*   PT/INR No results for input(s): LABPROT, INR in the last 72 hours. CMP     Component Value Date/Time   NA 139 02/24/2018 0526   NA 137 07/10/2017   K 4.0 02/24/2018 0526   CL 108 02/24/2018 0526   CO2 25 02/24/2018 0526   GLUCOSE 98 02/24/2018 0526   BUN 7 02/24/2018 0526   BUN 16 07/10/2017   CREATININE 0.86 02/24/2018 0526   CALCIUM 8.1 (L) 02/24/2018 0526   PROT 4.2 (L) 02/24/2018 0526   ALBUMIN 2.3 (L) 02/24/2018 0526   AST 20 02/24/2018 0526   ALT 15 (L) 02/24/2018 0526   ALKPHOS 62  02/24/2018 0526   BILITOT 0.6 02/24/2018 0526   GFRNONAA >60 02/24/2018 0526   GFRAA >60 02/24/2018 0526   Lipase  No results found for: LIPASE     Studies/Results: No results found.  Anti-infectives: Anti-infectives (From admission, onward)   Start     Dose/Rate Route Frequency Ordered Stop   02/16/18 1600  erythromycin 1,000 mg in sodium chloride 0.9 % 250 mL IVPB     1,000 mg 250 mL/hr over 60 Minutes Intravenous To Short Stay 02/16/18 1440 02/17/18 1600       Assessment/Plan GI bleed with 9.7 x 8.8 x 10.9 cm gastric mass - GIST Tumor Chronic pain back and joints History of prostate cancer Chronic pain -back and joints -on Opana/morphine 10 mg every 6 hours p.o. Acute anemia Restless leg syndrome History of prostate cancer/TURP/acute urinary retention Obstructive sleep apnea/asthma Chronic constipation Chronic kidney disease Malnutrition/deconditioning  FEN: Regular diet ID: None DVT: SCDs Foley: out  Plan: Patient likely being discharged today. He will follow up in our office with Dr. Barry Dienes in 1 week to discuss surgical options. If patient re-bleeds and returns to the hospital prior to his outpatient appointment Dr. Dalbert Batman will take the patient to the operating room.   LOS: 8 days  Wellington Hampshire , C[facility] 02/24/2018, 11:30 AM Pager: [phone] Consults: [phone] Mon-Fri 7:00 am-4:30 pm Sat-Sun 7:00 am-11:30 am

## 2018-03-02 NOTE — Progress Notes (Signed)
Agree with diagnosis of Malnutrition, severity is mild.

## 2018-03-03 ENCOUNTER — Other Ambulatory Visit: Payer: Self-pay | Admitting: General Surgery

## 2018-03-03 NOTE — Progress Notes (Unsigned)
No chief complaint on file.   Referring MD: ***  HISTORY: ***  Past Medical History:  Diagnosis Date  . Asthma    AS A CHILD  . Blind left eye   . Cancer [facility])    PROSTATE CANCER  . Cellulitis    frequently, "all over"  . Chronic kidney disease   . Chronic pain syndrome   . Constipation   . DDD (degenerative disc disease), lumbar   . GI bleeding   . Insomnia   . Kyphoscoliosis   . Mass in the abdomen 01/2018  . Restless leg     Past Surgical History:  Procedure Laterality Date  . CHOLECYSTECTOMY    . COLONOSCOPY    . ESOPHAGOGASTRODUODENOSCOPY (EGD) WITH PROPOFOL Left 02/16/2018   Procedure: ESOPHAGOGASTRODUODENOSCOPY (EGD) WITH PROPOFOL;  Surgeon: [name], MD;  Location: [place];  Service: Gastroenterology;  Laterality: Left;  . PROSTATECTOMY    . QUADRICEPS TENDON REPAIR Left 07/07/2017   Procedure: REPAIR QUADRICEP TENDON;  Surgeon: Rod Can, MD;  Location: [place];  Service: Orthopedics;  Laterality: Left;  . RIGHT CATARACT EXTRACTION  2013  . RIGTH NEPHRECTOMY  2006  . TONSILLECTOMY    . TRANSURETHRAL RESECTION OF PROSTATE  1997    Current Outpatient Medications  Medication Sig Dispense Refill  . gabapentin (NEURONTIN) 300 MG capsule Take 1 capsule (300 mg total) by mouth 3 (three) times daily. (0900 & 2100) 90 capsule 0  . mirtazapine (REMERON) 7.5 MG tablet Take 7.5 mg by mouth at bedtime.    Marland Kitchen MOVANTIK 25 MG TABS tablet Take 25 mg by mouth daily.  6  . oxymorphone (OPANA) 10 MG tablet Take 1 tablet (10 mg total) by mouth every 6 (six) hours as needed for pain. 120 tablet 0   No current facility-administered medications for this visit.      Allergies  Allergen Reactions  . Peanuts [Peanut Oil] Shortness Of Breath    Shortness of breath---all nuts, but can have peanuts, cashews, and almonds  . Coconut Flavor [Flavoring Agent]     Anything that is related to coconut  . Coconut Oil Other (See Comments)    Unknown childhood reaction.        Family History  Problem Relation Age of Onset  . Bone cancer Mother   . Hypertension Mother   . Arthritis Mother      Social History   Socioeconomic History  . Marital status: Widowed    Spouse name: Not on file  . Number of children: Not on file  . Years of education: Not on file  . Highest education level: Not on file  Occupational History  . Not on file  Social Needs  . Financial resource strain: Not on file  . Food insecurity:    Worry: Not on file    Inability: Not on file  . Transportation needs:    Medical: Not on file    Non-medical: Not on file  Tobacco Use  . Smoking status: Never Smoker  . Smokeless tobacco: Never Used  Substance and Sexual Activity  . Alcohol use: No    Alcohol/week: 0.0 oz    Comment: quit 1985, 2 fifths/ week,S/P rehab  . Drug use: No  . Sexual activity: Never  Lifestyle  . Physical activity:    Days per week: Not on file    Minutes per session: Not on file  . Stress: Not on file  Relationships  . Social connections:    Talks on phone: Not  on file    Gets together: Not on file    Attends religious service: Not on file    Active member of club or organization: Not on file    Attends meetings of clubs or organizations: Not on file    Relationship status: Not on file  Other Topics Concern  . Not on file  Social History Narrative   Caffeine 2-3 cups average.  Widowed,  Lives at [facility], [place]. Living.  Retired Forensic psychologist.  One son.     REVIEW OF SYSTEMS - PERTINENT POSITIVES ONLY: 12 point review of systems negative other than HPI and PMH except for ***  EXAM: There were no vitals filed for this visit.  Wt Readings from Last 3 Encounters:  02/24/18 75.1 kg (165 lb 9.1 oz)  01/20/18 68.4 kg (150 lb 12.8 oz)  12/31/17 67.4 kg (148 lb 9.4 oz)     Gen:  No acute distress.  Well nourished and well groomed.   Neurological: Alert and oriented to person, place, and time. Coordination normal.  Head: Normocephalic  and atraumatic.  Eyes: Conjunctivae are normal. Pupils are equal, round, and reactive to light. No scleral icterus.  Neck: Normal range of motion. Neck supple. No tracheal deviation or thyromegaly present.  Cardiovascular: Normal rate, regular rhythm, normal heart sounds and intact distal pulses.  Exam reveals no gallop and no friction rub.  No murmur heard. Breast: *** Respiratory: Effort normal.  No respiratory distress. No chest wall tenderness. Breath sounds normal.  No wheezes, rales or rhonchi.  GI: Soft. Bowel sounds are normal. The abdomen is soft and nontender.  There is no rebound and no guarding.  Rectal:  *** Musculoskeletal: Normal range of motion. Extremities are nontender.  Lymphadenopathy: No cervical, preauricular, postauricular or axillary adenopathy is present Skin: Skin is warm and dry. No rash noted. No diaphoresis. No erythema. No pallor. No clubbing, cyanosis, or edema.   Psychiatric: Normal mood and affect. Behavior is normal. Judgment and thought content normal.    LABORATORY RESULTS: Available labs are reviewed   Recent Results (from the past 2160 hour(s))  Brain natriuretic peptide (order if patient c/o SOB ONLY)     Status: None   Collection Time: 12/30/17  3:31 PM  Result Value Ref Range   B Natriuretic Peptide 56.5 0.0 - 100.0 pg/mL  Basic metabolic panel     Status: None   Collection Time: 12/30/17  3:58 PM  Result Value Ref Range   Sodium 138 135 - 145 mmol/L   Potassium 4.3 3.5 - 5.1 mmol/L   Chloride 104 101 - 111 mmol/L   CO2 24 22 - 32 mmol/L   Glucose, Bld 90 65 - 99 mg/dL   BUN 20 6 - 20 mg/dL   Creatinine, Ser 0.88 0.61 - 1.24 mg/dL   Calcium 9.0 8.9 - 10.3 mg/dL   GFR calc non Af Amer >60 >60 mL/min   GFR calc Af Amer >60 >60 mL/min    Comment: (NOTE) The eGFR has been calculated using the CKD EPI equation. This calculation has not been validated in all clinical situations. eGFR's persistently <60 mL/min signify possible Chronic  Kidney Disease.    Anion gap 10 5 - 15  CBC     Status: None   Collection Time: 12/30/17  3:58 PM  Result Value Ref Range   WBC 8.9 4.0 - 10.5 K/uL   RBC 5.01 4.22 - 5.81 MIL/uL   Hemoglobin 15.1 13.0 - 17.0 g/dL   HCT 45.9  39.0 - 52.0 %   MCV 91.6 78.0 - 100.0 fL   MCH 30.1 26.0 - 34.0 pg   MCHC 32.9 30.0 - 36.0 g/dL   RDW 13.7 11.5 - 15.5 %   Platelets 151 150 - 400 K/uL  Magnesium     Status: None   Collection Time: 12/30/17  3:58 PM  Result Value Ref Range   Magnesium 2.2 1.7 - 2.4 mg/dL  D-dimer, quantitative (not at [facility])     Status: Abnormal   Collection Time: 12/30/17  3:58 PM  Result Value Ref Range   D-Dimer, Quant 1.78 (H) 0.00 - 0.50 ug/mL-FEU    Comment: (NOTE) At the manufacturer cut-off of 0.50 ug/mL FEU, this assay has been documented to exclude PE with a sensitivity and negative predictive value of 97 to 99%.  At this time, this assay has not been approved by the FDA to exclude DVT/VTE. Results should be correlated with clinical presentation.   I-stat troponin, ED (0, 3)     Status: None   Collection Time: 12/30/17  4:10 PM  Result Value Ref Range   Troponin i, poc 0.01 0.00 - 0.08 ng/mL   Comment 3            Comment: Due to the release kinetics of cTnI, a negative result within the first hours of the onset of symptoms does not rule out myocardial infarction with certainty. If myocardial infarction is still suspected, repeat the test at appropriate intervals.   CBG monitoring, ED     Status: None   Collection Time: 12/30/17  4:11 PM  Result Value Ref Range   Glucose-Capillary 84 65 - 99 mg/dL  I-stat troponin, ED (0, 3)     Status: None   Collection Time: 12/30/17  7:03 PM  Result Value Ref Range   Troponin i, poc 0.00 0.00 - 0.08 ng/mL   Comment 3            Comment: Due to the release kinetics of cTnI, a negative result within the first hours of the onset of symptoms does not rule out myocardial infarction with certainty. If myocardial  infarction is still suspected, repeat the test at appropriate intervals.   Troponin I-serum (0, 3, 6 hours)     Status: None   Collection Time: 12/30/17  8:24 PM  Result Value Ref Range   Troponin I <0.03 <0.03 ng/mL  Troponin I-serum (0, 3, 6 hours)     Status: None   Collection Time: 12/30/17 11:09 PM  Result Value Ref Range   Troponin I <0.03 <0.03 ng/mL  Troponin I-serum (0, 3, 6 hours)     Status: None   Collection Time: 12/31/17  3:03 AM  Result Value Ref Range   Troponin I <0.03 <0.03 ng/mL  CBC with Differential/Platelet     Status: None   Collection Time: 12/31/17  8:02 AM  Result Value Ref Range   WBC 7.0 4.0 - 10.5 K/uL   RBC 5.01 4.22 - 5.81 MIL/uL   Hemoglobin 14.9 13.0 - 17.0 g/dL   HCT 45.4 39.0 - 52.0 %   MCV 90.6 78.0 - 100.0 fL   MCH 29.7 26.0 - 34.0 pg   MCHC 32.8 30.0 - 36.0 g/dL   RDW 13.8 11.5 - 15.5 %   Platelets 163 150 - 400 K/uL   Neutrophils Relative % 53 %   Neutro Abs 3.7 1.7 - 7.7 K/uL   Lymphocytes Relative 32 %   Lymphs Abs 2.2 0.7 -  4.0 K/uL   Monocytes Relative 10 %   Monocytes Absolute 0.7 0.1 - 1.0 K/uL   Eosinophils Relative 5 %   Eosinophils Absolute 0.3 0.0 - 0.7 K/uL   Basophils Relative 0 %   Basophils Absolute 0.0 0.0 - 0.1 K/uL  Comprehensive metabolic panel     Status: Abnormal   Collection Time: 12/31/17  8:02 AM  Result Value Ref Range   Sodium 138 135 - 145 mmol/L   Potassium 4.4 3.5 - 5.1 mmol/L   Chloride 106 101 - 111 mmol/L   CO2 23 22 - 32 mmol/L   Glucose, Bld 90 65 - 99 mg/dL   BUN 16 6 - 20 mg/dL   Creatinine, Ser 0.81 0.61 - 1.24 mg/dL   Calcium 8.9 8.9 - 10.3 mg/dL   Total Protein 6.1 (L) 6.5 - 8.1 g/dL   Albumin 3.5 3.5 - 5.0 g/dL   AST 29 15 - 41 U/L   ALT 17 17 - 63 U/L   Alkaline Phosphatase 73 38 - 126 U/L   Total Bilirubin 0.9 0.3 - 1.2 mg/dL   GFR calc non Af Amer >60 >60 mL/min   GFR calc Af Amer >60 >60 mL/min    Comment: (NOTE) The eGFR has been calculated using the CKD EPI equation. This  calculation has not been validated in all clinical situations. eGFR's persistently <60 mL/min signify possible Chronic Kidney Disease.    Anion gap 9 5 - 15  Magnesium     Status: None   Collection Time: 12/31/17  8:02 AM  Result Value Ref Range   Magnesium 2.2 1.7 - 2.4 mg/dL  Phosphorus     Status: None   Collection Time: 12/31/17  8:02 AM  Result Value Ref Range   Phosphorus 2.9 2.5 - 4.6 mg/dL  MRSA PCR Screening     Status: None   Collection Time: 12/31/17  2:40 PM  Result Value Ref Range   MRSA by PCR NEGATIVE NEGATIVE    Comment:        The GeneXpert MRSA Assay (FDA approved for NASAL specimens only), is one component of a comprehensive MRSA colonization surveillance program. It is not intended to diagnose MRSA infection nor to guide or monitor treatment for MRSA infections.   ECHOCARDIOGRAM COMPLETE     Status: None   Collection Time: 12/31/17  2:47 PM  Result Value Ref Range   Weight 2,377.44 oz   Height 72 in   BP 121/57 mmHg  Comprehensive metabolic panel     Status: Abnormal   Collection Time: 02/16/18  6:50 AM  Result Value Ref Range   Sodium 139 135 - 145 mmol/L   Potassium 4.9 3.5 - 5.1 mmol/L   Chloride 109 101 - 111 mmol/L   CO2 19 (L) 22 - 32 mmol/L   Glucose, Bld 134 (H) 65 - 99 mg/dL   BUN 60 (H) 6 - 20 mg/dL   Creatinine, Ser 1.21 0.61 - 1.24 mg/dL   Calcium 8.2 (L) 8.9 - 10.3 mg/dL   Total Protein 4.7 (L) 6.5 - 8.1 g/dL   Albumin 2.7 (L) 3.5 - 5.0 g/dL   AST 28 15 - 41 U/L   ALT 16 (L) 17 - 63 U/L   Alkaline Phosphatase 52 38 - 126 U/L   Total Bilirubin 0.6 0.3 - 1.2 mg/dL   GFR calc non Af Amer 51 (L) >60 mL/min   GFR calc Af Amer 59 (L) >60 mL/min    Comment: (NOTE) The eGFR  has been calculated using the CKD EPI equation. This calculation has not been validated in all clinical situations. eGFR's persistently <60 mL/min signify possible Chronic Kidney Disease.    Anion gap 11 5 - 15    Comment: Performed at [facility] [address], [place], [place] [address]  CBC     Status: Abnormal   Collection Time: 02/16/18  6:50 AM  Result Value Ref Range   WBC 17.2 (H) 4.0 - 10.5 K/uL   RBC 2.57 (L) 4.22 - 5.81 MIL/uL   Hemoglobin 7.7 (L) 13.0 - 17.0 g/dL   HCT 24.0 (L) 39.0 - 52.0 %   MCV 93.4 78.0 - 100.0 fL   MCH 30.0 26.0 - 34.0 pg   MCHC 32.1 30.0 - 36.0 g/dL   RDW 13.9 11.5 - 15.5 %   Platelets 188 150 - 400 K/uL    Comment: Performed at [facility], [place] [address], [place], [place] [address]  Type and screen [place]     Status: None   Collection Time: 02/16/18  6:50 AM  Result Value Ref Range   ABO/RH(D) A POS    Antibody Screen NEG    Sample Expiration 02/19/2018    Unit Number [id number]    Blood Component Type RED CELLS,LR    Unit division 00    Status of Unit ISSUED,FINAL    Transfusion Status OK TO TRANSFUSE    Crossmatch Result Compatible    Unit Number [id number]    Blood Component Type RED CELLS,LR    Unit division 00    Status of Unit ISSUED,FINAL    Transfusion Status OK TO TRANSFUSE    Crossmatch Result Compatible    Unit Number [id number]    Blood Component Type RED CELLS,LR    Unit division 00    Status of Unit ISSUED,FINAL    Transfusion Status OK TO TRANSFUSE    Crossmatch Result      Compatible Performed at [facility], [place] [address] [place], [place] [address]   ABO/Rh     Status: None   Collection Time: 02/16/18  6:50 AM  Result Value Ref Range   ABO/RH(D)      A POS Performed at [facility] [address] [place],  [address]   BPAM RBC     Status: None   Collection Time: 02/16/18  6:50 AM  Result Value Ref Range   ISSUE DATE / TIME 937342876811    Blood Product Unit Number [id number]    PRODUCT CODE [id number]    Unit Type and Rh 6200    Blood Product Expiration Date 638453646803    ISSUE DATE / TIME 212248250037    Blood Product Unit Number [id number]    PRODUCT CODE [id number]    Unit Type and Rh  4917    Blood Product Expiration Date 915056979480    ISSUE DATE / TIME 165537482707    Blood Product Unit Number [id number]    PRODUCT CODE [id number]    Unit Type and Rh 6200    Blood Product Expiration Date 325498264158   I-Stat CG4 Lactic Acid, ED     Status: Abnormal   Collection Time: 02/16/18  7:32 AM  Result Value Ref Range   Lactic Acid, Venous 3.75 (HH) 0.5 - 1.9 mmol/L   Comment NOTIFIED PHYSICIAN   I-Stat Chem 8, ED     Status: Abnormal   Collection Time: 02/16/18  7:32 AM  Result Value  Ref Range   Sodium 141 135 - 145 mmol/L   Potassium 4.8 3.5 - 5.1 mmol/L   Chloride 107 101 - 111 mmol/L   BUN 58 (H) 6 - 20 mg/dL   Creatinine, Ser 1.10 0.61 - 1.24 mg/dL   Glucose, Bld 126 (H) 65 - 99 mg/dL   Calcium, Ion 1.10 (L) 1.15 - 1.40 mmol/L   TCO2 22 22 - 32 mmol/L   Hemoglobin 7.8 (L) 13.0 - 17.0 g/dL   HCT 23.0 (L) 39.0 - 52.0 %  POC occult blood, ED     Status: Abnormal   Collection Time: 02/16/18  7:36 AM  Result Value Ref Range   Fecal Occult Bld POSITIVE (A) NEGATIVE  Prepare RBC     Status: None   Collection Time: 02/16/18  8:04 AM  Result Value Ref Range   Order Confirmation      ORDER PROCESSED BY BLOOD BANK Performed at [facility], [address] [place], [place] [address]   Hemoglobin     Status: Abnormal   Collection Time: 02/16/18 12:00 PM  Result Value Ref Range   Hemoglobin 9.7 (L) 13.0 - 17.0 g/dL    Comment: Performed at [facility], [place] [address], [place], [place] [address]  I-Stat CG4 Lactic Acid, ED     Status: Abnormal   Collection Time: 02/16/18  3:16 PM  Result Value Ref Range   Lactic Acid, Venous 2.36 (HH) 0.5 - 1.9 mmol/L   Comment NOTIFIED PHYSICIAN   Lactic acid, plasma     Status: None   Collection Time: 02/16/18  5:33 PM  Result Value Ref Range   Lactic Acid, Venous 1.3 0.5 - 1.9 mmol/L    Comment: Performed at [facility], [place] [address] [place], [place] [address]  Hemoglobin and hematocrit, blood      Status: Abnormal   Collection Time: 02/16/18  7:52 PM  Result Value Ref Range   Hemoglobin 9.1 (L) 13.0 - 17.0 g/dL   HCT 27.0 (L) 39.0 - 52.0 %    Comment: Performed at [facility] [address] [place], [place] [address]  Basic metabolic panel     Status: Abnormal   Collection Time: 02/17/18  4:15 AM  Result Value Ref Range   Sodium 143 135 - 145 mmol/L   Potassium 4.5 3.5 - 5.1 mmol/L   Chloride 114 (H) 101 - 111 mmol/L   CO2 22 22 - 32 mmol/L   Glucose, Bld 116 (H) 65 - 99 mg/dL   BUN 32 (H) 6 - 20 mg/dL   Creatinine, Ser 0.96 0.61 - 1.24 mg/dL   Calcium 7.9 (L) 8.9 - 10.3 mg/dL   GFR calc non Af Amer >60 >60 mL/min   GFR calc Af Amer >60 >60 mL/min    Comment: (NOTE) The eGFR has been calculated using the CKD EPI equation. This calculation has not been validated in all clinical situations. eGFR's persistently <60 mL/min signify possible Chronic Kidney Disease.    Anion gap 7 5 - 15    Comment: Performed at [facility] [address], [place], [place] [address]  CBC     Status: Abnormal   Collection Time: 02/17/18  4:15 AM  Result Value Ref Range   WBC 18.8 (H) 4.0 - 10.5 K/uL   RBC 3.02 (L) 4.22 - 5.81 MIL/uL   Hemoglobin 9.1 (L) 13.0 - 17.0 g/dL   HCT 27.9 (L) 39.0 - 52.0 %  MCV 92.4 78.0 - 100.0 fL   MCH 30.1 26.0 - 34.0 pg   MCHC 32.6 30.0 - 36.0 g/dL   RDW 14.2 11.5 - 15.5 %   Platelets 163 150 - 400 K/uL    Comment: Performed at [facility], [place] [address], [place], [place] [address]  Hemoglobin and hematocrit, blood     Status: Abnormal   Collection Time: 02/17/18 11:05 AM  Result Value Ref Range   Hemoglobin 9.4 (L) 13.0 - 17.0 g/dL   HCT 29.5 (L) 39.0 - 52.0 %    Comment: Performed at [facility], [place] [address] [place], [place] [address]  Hemoglobin and hematocrit, blood     Status: Abnormal   Collection Time: 02/17/18  8:23 PM  Result Value Ref Range   Hemoglobin 8.1 (L) 13.0 - 17.0 g/dL   HCT 25.0 (L) 39.0 - 52.0 %     Comment: Performed at [facility] [address], [place], [place] [address]  CBC     Status: Abnormal   Collection Time: 02/18/18  4:36 AM  Result Value Ref Range   WBC 15.0 (H) 4.0 - 10.5 K/uL   RBC 2.70 (L) 4.22 - 5.81 MIL/uL   Hemoglobin 8.1 (L) 13.0 - 17.0 g/dL   HCT 25.3 (L) 39.0 - 52.0 %   MCV 93.7 78.0 - 100.0 fL   MCH 30.0 26.0 - 34.0 pg   MCHC 32.0 30.0 - 36.0 g/dL   RDW 14.6 11.5 - 15.5 %   Platelets 177 150 - 400 K/uL    Comment: Performed at [facility], [place] [address] [place], [place] [address]  Basic metabolic panel     Status: Abnormal   Collection Time: 02/18/18  4:36 AM  Result Value Ref Range   Sodium 141 135 - 145 mmol/L   Potassium 3.6 3.5 - 5.1 mmol/L    Comment: DELTA CHECK NOTED   Chloride 116 (H) 101 - 111 mmol/L   CO2 21 (L) 22 - 32 mmol/L   Glucose, Bld 90 65 - 99 mg/dL   BUN 14 6 - 20 mg/dL   Creatinine, Ser 0.79 0.61 - 1.24 mg/dL   Calcium 7.6 (L) 8.9 - 10.3 mg/dL   GFR calc non Af Amer >60 >60 mL/min   GFR calc Af Amer >60 >60 mL/min    Comment: (NOTE) The eGFR has been calculated using the CKD EPI equation. This calculation has not been validated in all clinical situations. eGFR's persistently <60 mL/min signify possible Chronic Kidney Disease.    Anion gap 4 (L) 5 - 15    Comment: Performed at [facility] [address], [place], [place] [address]  Hemoglobin and hematocrit, blood     Status: Abnormal   Collection Time: 02/18/18 11:03 AM  Result Value Ref Range   Hemoglobin 7.6 (L) 13.0 - 17.0 g/dL   HCT 23.6 (L) 39.0 - 52.0 %    Comment: Performed at [facility], [place] [address], [place], [place] [address]  Hemoglobin and hematocrit, blood     Status: Abnormal   Collection Time: 02/18/18  3:17 PM  Result Value Ref Range   Hemoglobin 7.4 (L) 13.0 - 17.0 g/dL   HCT 22.8 (L) 39.0 - 52.0 %    Comment: Performed at [facility] [address] [place],  [address]  Prepare RBC     Status: None    Collection Time: 02/18/18  3:25  PM  Result Value Ref Range   Order Confirmation      ORDER PROCESSED BY BLOOD BANK Performed at [facility], [place] [address] [place], [place] [address]   Hemoglobin and hematocrit, blood     Status: Abnormal   Collection Time: 02/18/18  9:11 PM  Result Value Ref Range   Hemoglobin 9.3 (L) 13.0 - 17.0 g/dL    Comment: REPEATED TO VERIFY POST TRANSFUSION SPECIMEN    HCT 29.0 (L) 39.0 - 52.0 %    Comment: Performed at [facility] [address] [place], [place] [address]  CBC     Status: Abnormal   Collection Time: 02/19/18  4:34 AM  Result Value Ref Range   WBC 13.8 (H) 4.0 - 10.5 K/uL   RBC 3.12 (L) 4.22 - 5.81 MIL/uL   Hemoglobin 9.4 (L) 13.0 - 17.0 g/dL   HCT 29.1 (L) 39.0 - 52.0 %   MCV 93.3 78.0 - 100.0 fL   MCH 30.1 26.0 - 34.0 pg   MCHC 32.3 30.0 - 36.0 g/dL   RDW 15.3 11.5 - 15.5 %   Platelets 217 150 - 400 K/uL    Comment: Performed at [facility], [place] [address], [place], [place] [address]  Basic metabolic panel     Status: Abnormal   Collection Time: 02/19/18  4:34 AM  Result Value Ref Range   Sodium 140 135 - 145 mmol/L   Potassium 3.3 (L) 3.5 - 5.1 mmol/L   Chloride 116 (H) 101 - 111 mmol/L   CO2 19 (L) 22 - 32 mmol/L   Glucose, Bld 98 65 - 99 mg/dL   BUN 9 6 - 20 mg/dL   Creatinine, Ser 0.80 0.61 - 1.24 mg/dL   Calcium 7.3 (L) 8.9 - 10.3 mg/dL   GFR calc non Af Amer >60 >60 mL/min   GFR calc Af Amer >60 >60 mL/min    Comment: (NOTE) The eGFR has been calculated using the CKD EPI equation. This calculation has not been validated in all clinical situations. eGFR's persistently <60 mL/min signify possible Chronic Kidney Disease.    Anion gap 5 5 - 15    Comment: Performed at [facility] [address], [place], [place] [address]  Protime-INR     Status: Abnormal   Collection Time: 02/19/18  4:34 AM  Result Value Ref Range   Prothrombin Time 15.3 (H) 11.4 - 15.2 seconds   INR 1.22     Comment:  Performed at [facility] [address], [place], [place] [address]  Hemoglobin and hematocrit, blood     Status: Abnormal   Collection Time: 02/19/18 11:45 AM  Result Value Ref Range   Hemoglobin 9.8 (L) 13.0 - 17.0 g/dL   HCT 30.9 (L) 39.0 - 52.0 %    Comment: Performed at [facility], [place] [address], [place], [place] [address]  Hemoglobin and hematocrit, blood     Status: Abnormal   Collection Time: 02/19/18  7:47 PM  Result Value Ref Range   Hemoglobin 10.3 (L) 13.0 - 17.0 g/dL   HCT 32.0 (L) 39.0 - 52.0 %    Comment: Performed at [facility] [address], [place] [address]  CBC     Status: Abnormal   Collection Time: 02/20/18  4:32 AM  Result Value Ref Range   WBC 12.1 (H) 4.0 - 10.5 K/uL   RBC 3.23 (L) 4.22 - 5.81 MIL/uL  Hemoglobin 9.4 (L) 13.0 - 17.0 g/dL   HCT 30.0 (L) 39.0 - 52.0 %   MCV 92.9 78.0 - 100.0 fL   MCH 29.1 26.0 - 34.0 pg   MCHC 31.3 30.0 - 36.0 g/dL   RDW 15.3 11.5 - 15.5 %   Platelets 272 150 - 400 K/uL    Comment: Performed at [facility] [address] [place], [place] [address]  Basic metabolic panel     Status: Abnormal   Collection Time: 02/20/18  4:32 AM  Result Value Ref Range   Sodium 138 135 - 145 mmol/L   Potassium 3.7 3.5 - 5.1 mmol/L   Chloride 112 (H) 101 - 111 mmol/L   CO2 18 (L) 22 - 32 mmol/L   Glucose, Bld 84 65 - 99 mg/dL   BUN 8 6 - 20 mg/dL   Creatinine, Ser 0.81 0.61 - 1.24 mg/dL   Calcium 7.4 (L) 8.9 - 10.3 mg/dL   GFR calc non Af Amer >60 >60 mL/min   GFR calc Af Amer >60 >60 mL/min    Comment: (NOTE) The eGFR has been calculated using the CKD EPI equation. This calculation has not been validated in all clinical situations. eGFR's persistently <60 mL/min signify possible Chronic Kidney Disease.    Anion gap 8 5 - 15    Comment: Performed at [facility] [address] [place], [place] [address]  Hemoglobin and hematocrit, blood     Status: Abnormal   Collection Time:  02/20/18 11:53 AM  Result Value Ref Range   Hemoglobin 10.2 (L) 13.0 - 17.0 g/dL   HCT 32.0 (L) 39.0 - 52.0 %    Comment: Performed at [facility], [place] [address] [place], [place] [address]  Hemoglobin and hematocrit, blood     Status: Abnormal   Collection Time: 02/20/18  7:29 PM  Result Value Ref Range   Hemoglobin 10.2 (L) 13.0 - 17.0 g/dL   HCT 31.9 (L) 39.0 - 52.0 %    Comment: Performed at [facility], [place] [address], [place], [place] [address]  CBC     Status: Abnormal   Collection Time: 02/21/18  4:49 AM  Result Value Ref Range   WBC 13.3 (H) 4.0 - 10.5 K/uL   RBC 3.24 (L) 4.22 - 5.81 MIL/uL   Hemoglobin 9.8 (L) 13.0 - 17.0 g/dL   HCT 30.2 (L) 39.0 - 52.0 %   MCV 93.2 78.0 - 100.0 fL   MCH 30.2 26.0 - 34.0 pg   MCHC 32.5 30.0 - 36.0 g/dL   RDW 15.6 (H) 11.5 - 15.5 %   Platelets 270 150 - 400 K/uL    Comment: Performed at [facility], [place] [address], [place], [place] [address]  Basic metabolic panel     Status: Abnormal   Collection Time: 02/21/18  4:49 AM  Result Value Ref Range   Sodium 139 135 - 145 mmol/L   Potassium 3.1 (L) 3.5 - 5.1 mmol/L   Chloride 110 101 - 111 mmol/L   CO2 21 (L) 22 - 32 mmol/L   Glucose, Bld 106 (H) 65 - 99 mg/dL   BUN 7 6 - 20 mg/dL   Creatinine, Ser 0.76 0.61 - 1.24 mg/dL   Calcium 7.5 (L) 8.9 - 10.3 mg/dL   GFR calc non Af Amer >60 >60 mL/min   GFR calc Af Amer >60 >60 mL/min    Comment: (NOTE) The eGFR has been calculated using  the CKD EPI equation. This calculation has not been validated in all clinical situations. eGFR's persistently <60 mL/min signify possible Chronic Kidney Disease.    Anion gap 8 5 - 15    Comment: Performed at [facility] [address], [place], [place] [address]  Hemoglobin and hematocrit, blood     Status: Abnormal   Collection Time: 02/21/18 12:50 PM  Result Value Ref Range   Hemoglobin 9.8 (L) 13.0 - 17.0 g/dL   HCT 30.8 (L) 39.0 - 52.0 %    Comment: Performed at [facility] [address], [place], [place] [address]  Magnesium     Status: None   Collection Time: 02/21/18 12:50 PM  Result Value Ref Range   Magnesium 1.7 1.7 - 2.4 mg/dL    Comment: Performed at [facility], [place] [address] [place], [place] [address]  Hemoglobin and hematocrit, blood     Status: Abnormal   Collection Time: 02/21/18  8:32 PM  Result Value Ref Range   Hemoglobin 9.8 (L) 13.0 - 17.0 g/dL   HCT 31.0 (L) 39.0 - 52.0 %    Comment: Performed at [facility] [address] [place], [place] [address]  CBC     Status: Abnormal   Collection Time: 02/22/18  4:48 AM  Result Value Ref Range   WBC 10.4 4.0 - 10.5 K/uL   RBC 3.05 (L) 4.22 - 5.81 MIL/uL   Hemoglobin 9.0 (L) 13.0 - 17.0 g/dL   HCT 28.3 (L) 39.0 - 52.0 %   MCV 92.8 78.0 - 100.0 fL   MCH 29.5 26.0 - 34.0 pg   MCHC 31.8 30.0 - 36.0 g/dL   RDW 15.7 (H) 11.5 - 15.5 %   Platelets 277 150 - 400 K/uL    Comment: Performed at [facility], [place] [address], [place], [place] [address]  Basic metabolic panel     Status: Abnormal   Collection Time: 02/22/18  4:48 AM  Result Value Ref Range   Sodium 139 135 - 145 mmol/L   Potassium 4.1 3.5 - 5.1 mmol/L    Comment: DELTA CHECK NOTED   Chloride 114 (H) 101 - 111 mmol/L   CO2 21 (L) 22 - 32 mmol/L   Glucose, Bld 94 65 - 99 mg/dL   BUN 7 6 - 20 mg/dL   Creatinine, Ser 0.81 0.61 - 1.24 mg/dL   Calcium 7.7 (L) 8.9 - 10.3 mg/dL   GFR calc non Af Amer >60 >60 mL/min   GFR calc Af Amer >60 >60 mL/min    Comment: (NOTE) The eGFR has been calculated using the CKD EPI equation. This calculation has not been validated in all clinical situations. eGFR's persistently <60 mL/min signify possible Chronic Kidney Disease.    Anion gap 4 (L) 5 - 15    Comment: Performed at [facility] [address], [place], [place] [address]  Magnesium     Status: None   Collection Time: 02/22/18  4:48 AM  Result Value Ref Range   Magnesium 1.9 1.7 - 2.4 mg/dL     Comment: Performed at [facility] [address] [place] [address]  CBC     Status: Abnormal   Collection Time: 02/23/18  6:46 AM  Result Value Ref Range   WBC 10.9 (H) 4.0 - 10.5 K/uL   RBC 3.15 (L) 4.22 - 5.81 MIL/uL   Hemoglobin 9.4 (L) 13.0 - 17.0 g/dL  HCT 29.4 (L) 39.0 - 52.0 %   MCV 93.3 78.0 - 100.0 fL   MCH 29.8 26.0 - 34.0 pg   MCHC 32.0 30.0 - 36.0 g/dL   RDW 15.5 11.5 - 15.5 %   Platelets 299 150 - 400 K/uL    Comment: Performed at [facility] [address] [place], [place] [address]  Basic metabolic panel     Status: Abnormal   Collection Time: 02/23/18  6:46 AM  Result Value Ref Range   Sodium 140 135 - 145 mmol/L   Potassium 3.6 3.5 - 5.1 mmol/L   Chloride 110 101 - 111 mmol/L   CO2 23 22 - 32 mmol/L   Glucose, Bld 100 (H) 65 - 99 mg/dL   BUN 6 6 - 20 mg/dL   Creatinine, Ser 0.86 0.61 - 1.24 mg/dL   Calcium 7.8 (L) 8.9 - 10.3 mg/dL   GFR calc non Af Amer >60 >60 mL/min   GFR calc Af Amer >60 >60 mL/min    Comment: (NOTE) The eGFR has been calculated using the CKD EPI equation. This calculation has not been validated in all clinical situations. eGFR's persistently <60 mL/min signify possible Chronic Kidney Disease.    Anion gap 7 5 - 15    Comment: Performed at [facility] [address] [place], [place] [address]  Magnesium     Status: None   Collection Time: 02/23/18  6:46 AM  Result Value Ref Range   Magnesium 2.0 1.7 - 2.4 mg/dL    Comment: Performed at [facility] [address] [place], [place] [address]  Prealbumin     Status: Abnormal   Collection Time: 02/24/18  5:26 AM  Result Value Ref Range   Prealbumin 12.9 (L) 18 - 38 mg/dL    Comment: Performed at [facility] [address], [place], [place] [address]  CBC     Status: Abnormal   Collection Time: 02/24/18  5:26 AM  Result Value Ref Range   WBC 10.4 4.0 - 10.5 K/uL   RBC 3.17 (L) 4.22 - 5.81 MIL/uL   Hemoglobin 9.2 (L) 13.0 - 17.0 g/dL    HCT 29.6 (L) 39.0 - 52.0 %   MCV 93.4 78.0 - 100.0 fL   MCH 29.0 26.0 - 34.0 pg   MCHC 31.1 30.0 - 36.0 g/dL   RDW 15.3 11.5 - 15.5 %   Platelets 319 150 - 400 K/uL    Comment: Performed at [facility], [place] [address], [place],  [address]  Comprehensive metabolic panel     Status: Abnormal   Collection Time: 02/24/18  5:26 AM  Result Value Ref Range   Sodium 139 135 - 145 mmol/L   Potassium 4.0 3.5 - 5.1 mmol/L   Chloride 108 101 - 111 mmol/L   CO2 25 22 - 32 mmol/L   Glucose, Bld 98 65 - 99 mg/dL   BUN 7 6 - 20 mg/dL   Creatinine, Ser 0.86 0.61 - 1.24 mg/dL   Calcium 8.1 (L) 8.9 - 10.3 mg/dL   Total Protein 4.2 (L) 6.5 - 8.1 g/dL   Albumin 2.3 (L) 3.5 - 5.0 g/dL   AST 20 15 - 41 U/L   ALT 15 (L) 17 - 63 U/L   Alkaline Phosphatase 62 38 - 126 U/L   Total Bilirubin 0.6 0.3 - 1.2 mg/dL   GFR calc non Af Amer >60 >60 mL/min   GFR calc Af  Amer >60 >60 mL/min    Comment: (NOTE) The eGFR has been calculated using the CKD EPI equation. This calculation has not been validated in all clinical situations. eGFR's persistently <60 mL/min signify possible Chronic Kidney Disease.    Anion gap 6 5 - 15    Comment: Performed at [facility] [address] [place], [place] [address]     RADIOLOGY RESULTS: See E-Chart or I-Site for most recent results.  Images and reports are reviewed.  Ct Abdomen Pelvis W Contrast  Result Date: 02/17/2018 CLINICAL DATA:  84 year old male with history of submucosal ulcerated gastric mass noted on EGD along the lesser curvature of the stomach. EXAM: CT ABDOMEN AND PELVIS WITH CONTRAST TECHNIQUE: Multidetector CT imaging of the abdomen and pelvis was performed using the standard protocol following bolus administration of intravenous contrast. CONTRAST:  100 mL ISOVUE-300 IOPAMIDOL (ISOVUE-300) INJECTION 61% COMPARISON:  None. FINDINGS: Lower chest: Areas of cylindrical bronchiectasis, scarring and volume loss in the lower lobes of the  lungs bilaterally. Atherosclerosis in the left main, left anterior descending, left circumflex and right coronary arteries. Calcifications of the aortic valve. Hepatobiliary: Gastric mass (discussed below) comes in close proximity to the undersurface of the left lobe of the liver, without definitive evidence of direct invasion. Liver has a slightly shrunken appearance and nodular contour, which could suggest mild cirrhosis. No discrete cystic or solid hepatic lesions. No intra or extrahepatic biliary ductal dilatation. Status post cholecystectomy. Pancreas: No pancreatic mass. No pancreatic ductal dilatation. No pancreatic or peripancreatic fluid or inflammatory changes. Spleen: Calcified granuloma in the spleen. Adrenals/Urinary Tract: Right kidney is either surgically or congenitally absent, or atrophic. 4.7 x 6.8 x 4.0 cm low-attenuation lesion in the lower pole of the left kidney is compatible with a large simple cyst. No hydroureteronephrosis. Urinary bladder is nearly completely decompressed with an indwelling Foley balloon catheter in place. 2.0 x 1.4 cm left adrenal nodule is incompletely characterized. Right adrenal gland is normal in appearance. Stomach/Bowel: Along the lesser curvature of the distal stomach there is a large heterogeneously enhancing partially calcified gastric mass measuring 9.7 x 8.8 x 10.9 cm (axial image 34 of series 3 and coronal image 39 of series 6). Surgical clip extending into the gastric lumen from the left lateral margin of the mass, suggesting recent endoscopic biopsy. No pathologic dilatation of small bowel or colon. Normal appendix. Vascular/Lymphatic: Aortic atherosclerosis, without evidence of aneurysm or dissection in the abdominal or pelvic vasculature. No lymphadenopathy noted in the abdomen or pelvis. Reproductive: Prostate gland and seminal vesicles are unremarkable in appearance. Other: No significant volume of ascites.  No pneumoperitoneum. Musculoskeletal: Severe  levoscoliosis of the mid lumbar spine. There are no aggressive appearing lytic or blastic lesions noted in the visualized portions of the skeleton. IMPRESSION: 1. 9.7 x 8.8 x 10.9 cm aggressive appearing heterogeneously enhancing mass along the lesser curvature of the stomach, highly concerning for primary gastric neoplasm. This is intimately associated with the undersurface of the left lobe of the liver without definitive evidence of direct invasion (although superficial involvement of the hepatic capsule is suspected given the loss of the intervening fat plane). There is also an indeterminate left adrenal nodule measuring 2.0 x 1.4 cm, which could be metastatic. No other definite signs of metastatic disease elsewhere in the abdomen or pelvis. 2. Areas of cylindrical bronchiectasis and chronic scarring in the lower lobes of the lungs bilaterally. 3. Aortic atherosclerosis, in addition to left main and 3 vessel coronary artery disease.  4. Right kidney is either surgically or congenitally absent, or severely atrophic. 5. Additional incidental findings, as above. Electronically Signed   By: Vinnie Langton M.D.   On: 02/17/2018 16:03   Ct Biopsy  Result Date: 02/20/2018 CLINICAL DATA:  Gastric mass EXAM: CT GUIDED CORE BIOPSY OF GASTRIC MASS ANESTHESIA/SEDATION: Intravenous Fentanyl and Versed were administered as conscious sedation during continuous monitoring of the patient's level of consciousness and physiological / cardiorespiratory status by the radiology RN, with a total moderate sedation time of less than 20 minutes. PROCEDURE: The procedure risks, benefits, and alternatives were explained to the patient. Questions regarding the procedure were encouraged and answered. The patient understands and consents to the procedure. Select axial scans through the abdomen obtained. The lesion was localized and an appropriate skin entry site was determined and marked. The operative field was prepped with  chlorhexidinein a sterile fashion, and a sterile drape was applied covering the operative field. A sterile gown and sterile gloves were used for the procedure. Local anesthesia was provided with 1% Lidocaine. Under CT fluoroscopic guidance, a 17 gauge trocar needle was advanced to the margin of the lesion. Once needle tip position was confirmed, coaxial 18-gauge core biopsy samples were obtained, submitted in formalin to surgical pathology. The guide needle was removed. Postprocedure scans show no hemorrhage or other apparent complication. The patient tolerated the procedure well. COMPLICATIONS: None immediate FINDINGS: Complex gastric mass was localized. Representative core biopsy samples obtained as above. IMPRESSION: 1. Technically successful CT-guided core biopsy, gastric mass. Electronically Signed   By: Lucrezia Europe M.D.   On: 02/20/2018 08:33      ASSESSMENT AND PLAN: No problem-specific Assessment & Plan notes found for this encounter.     Milus Height MD Surgical Oncology, General and [facility]      Visit Diagnoses: No diagnosis found.  Primary Care Physician: Velna Hatchet, MD  Other care team ***

## 2018-03-09 NOTE — Pre-Procedure Instructions (Signed)
Obdulio Mash  03/09/2018      [id number] [address] - [place], [place] - [address] [place] [address] Phone: [phone] Fax: [phone]  [facility], [place] AT [place] [address] Phone: [phone] Fax: [phone]    Your procedure is scheduled on Thursday,March 12, 2018  Report to [facility] Admitting at 7:00 A.M.  Call this number if you have problems the morning of surgery:  [phone]   Remember: Drink the Pre-Surgery drink before leaving home on the morning of surgery.  Do not eat food or drink liquids after midnight Wednesday, March 11, 2018  Take these medicines the morning of surgery with A SIP OF WATER : gabapentin (NEURONTIN), if needed: oxymorphone (OPANA) for pain,  Lubricant eye drops for dry eyes,  Ocean nasal spray for congestion Stop taking Aspirin ( unless instructed otherwise by surgeon ) vitamins, fish oil, Melatonin and herbal medications. Do not take any NSAIDs ie: Ibuprofen, Advil, Naproxen (Aleve), Motrin, BC and Goody Powder; stop now.  Do not wear jewelry, make-up or nail polish.  Do not wear lotions, powders, or perfumes, or deodorant.  Do not shave 48 hours prior to surgery.  Men may shave face and neck.  Do not bring valuables to the hospital.  [facility] is not responsible for any belongings or valuables.  Contacts, dentures or bridgework may not be worn into surgery.  Leave your suitcase in the car.  After surgery it may be brought to your room. For patients admitted to the hospital, discharge time will be determined by your treatment team. Patients discharged the day of surgery will not be allowed to drive home.  Special instructions:  [facility] - Preparing for Surgery  Before surgery, you can play an important role.  Because skin is not sterile, your skin needs to be as free of  germs as possible.  You can reduce the number of germs on you skin by washing with CHG (chlorahexidine gluconate) soap before surgery.  CHG is an antiseptic cleaner which kills germs and bonds with the skin to continue killing germs even after washing.  Please DO NOT use if you have an allergy to CHG or antibacterial soaps.  If your skin becomes reddened/irritated stop using the CHG and inform your nurse when you arrive at Short Stay.  Do not shave (including legs and underarms) for at least 48 hours prior to the first CHG shower.  You may shave your face.  Please follow these instructions carefully:   1.  Shower with CHG Soap the night before surgery and the morning of Surgery.  2.  If you choose to wash your hair, wash your hair first as usual with your normal shampoo.  3.  After you shampoo, rinse your hair and body thoroughly to remove the Shampoo.  4.  Use CHG as you would any other liquid soap.  You can apply chg directly  to the skin and wash gently with scrungie or a clean washcloth.  5.  Apply the CHG Soap to your body ONLY FROM THE NECK DOWN.  Do not use on open wounds or open sores.  Avoid contact with your eyes, ears, mouth and genitals (private parts).  Wash genitals (private parts) with your normal soap.  6.  Wash thoroughly, paying special attention to the area where your surgery will be  performed.  7.  Thoroughly rinse your body with warm water from the neck down.  8.  DO NOT shower/wash with your normal soap after using and rinsing off the CHG Soap.  9.  Pat yourself dry with a clean towel.            10.  Wear clean pajamas.            11.  Place clean sheets on your bed the night of your first shower and do not sleep with pets.  Day of Surgery  Do not apply any lotions/deodorants the morning of surgery.  Please wear clean clothes to the hospital/surgery center.  Please read over the following fact sheets that you were given. Pain Booklet, Coughing and Deep Breathing and  Surgical Site Infection Prevention

## 2018-03-10 ENCOUNTER — Encounter (HOSPITAL_COMMUNITY)
Admission: RE | Admit: 2018-03-10 | Discharge: 2018-03-10 | Disposition: A | Discharge status: Home / Self Care | Payer: Medicare Other | Source: Ambulatory Visit | Attending: General Surgery | Admitting: General Surgery

## 2018-03-10 ENCOUNTER — Encounter (HOSPITAL_COMMUNITY): Payer: Self-pay

## 2018-03-10 ENCOUNTER — Other Ambulatory Visit: Payer: Self-pay

## 2018-03-10 HISTORY — DX: Anemia, unspecified: D64.9

## 2018-03-10 HISTORY — DX: Sleep apnea, unspecified: G47.30

## 2018-03-10 HISTORY — DX: Personal history of other medical treatment: Z92.89

## 2018-03-10 HISTORY — DX: Presence of spectacles and contact lenses: Z97.3

## 2018-03-10 HISTORY — DX: Gastrointestinal stromal tumor of stomach: C49.A2

## 2018-03-10 LAB — CBC WITH DIFFERENTIAL/PLATELET
BASOS PCT: 0 %
Basophils Absolute: 0 10*3/uL (ref 0.0–0.1)
EOS ABS: 0.5 10*3/uL (ref 0.0–0.7)
Eosinophils Relative: 8 %
HEMATOCRIT: 35.4 % — AB (ref 39.0–52.0)
Hemoglobin: 10.6 g/dL — ABNORMAL LOW (ref 13.0–17.0)
LYMPHS ABS: 1.6 10*3/uL (ref 0.7–4.0)
Lymphocytes Relative: 25 %
MCH: 27.7 pg (ref 26.0–34.0)
MCHC: 29.9 g/dL — AB (ref 30.0–36.0)
MCV: 92.4 fL (ref 78.0–100.0)
MONO ABS: 1 10*3/uL (ref 0.1–1.0)
MONOS PCT: 16 %
Neutro Abs: 3.3 10*3/uL (ref 1.7–7.7)
Neutrophils Relative %: 51 %
Platelets: 204 10*3/uL (ref 150–400)
RBC: 3.83 MIL/uL — ABNORMAL LOW (ref 4.22–5.81)
RDW: 14.3 % (ref 11.5–15.5)
WBC: 6.4 10*3/uL (ref 4.0–10.5)

## 2018-03-10 LAB — BASIC METABOLIC PANEL
Anion gap: 8 (ref 5–15)
BUN: 9 mg/dL (ref 6–20)
CALCIUM: 8.7 mg/dL — AB (ref 8.9–10.3)
CO2: 26 mmol/L (ref 22–32)
CREATININE: 1.03 mg/dL (ref 0.61–1.24)
Chloride: 105 mmol/L (ref 101–111)
GFR calc Af Amer: >60 mL/min (ref >60)
GFR calc non Af Amer: >60 mL/min (ref >60)
GLUCOSE: 106 mg/dL — AB (ref 65–99)
Potassium: 4.3 mmol/L (ref 3.5–5.1)
Sodium: 139 mmol/L (ref 135–145)

## 2018-03-10 LAB — TYPE AND SCREEN
ABO/RH(D): A POS
ANTIBODY SCREEN: NEGATIVE

## 2018-03-10 NOTE — Progress Notes (Addendum)
Pt accompanied by son, [name], to PAT visit.  Pt denies any acute cardiopulmonary issues. Pt under the care of Dr. Irish Lack, Cardiology. Pt denies having a stress test and cardiac cath. Pt resides at [facility]. Pre-op instructions reviewed with pt son (per pt request). Son was provided with a copy of instructions to give to nursing facility where pt resides. Both pt and son verbalized understanding of all pre-op instructions. Son to bring POA documentation on DOS. Pt chart forwarded to anesthesia for review of chest x ray, chest CT and EKG.

## 2018-03-11 NOTE — Progress Notes (Signed)
Anesthesia Chart Review: Patient is 84 year old male scheduled for laparoscopic assisted partial gastrectomy on 03/12/18 by Dr. Stark Klein.   History includes never smoker, GIST of stomach, anemia,rostate cancer (s/p prostatectomy), BPH (s/p TURP), right nephrectomy, CKD, OSA (no CPAP), chronic pain syndrome, childhood asthma, blind left eye, RLS, kyphoscoliosis, left quadricep tendon repair 07/07/17, tonsillectomy, cholecystectomy. CTA 12/30/17 showed 4.3 cm ascending TAA with one year follow-up recommended by radiology. - Admission 02/16/18-02/24/18 for acute GI bleed. EGD showed ulceration concerning for malignancy. 02/19/18 biopsy was positive for GIST. Out-patient resection planned. He also had issues with urinary retention and required short term Coude catheter.   - Admission 12/30/17-12/31/17 for chest pain. Troponin negative X 3. Echo showed normal LVEF. Cardiologist consulted with consideration of out-patient stress test. (Patient followed up with Dr. Beau Fanny and ultimately no stress test ordered. See below for visit details.)  He is currently a resident at [facility].  - PCP is Dr. Velna Hatchet. - Cardiologist is Dr. Larae Grooms. Last visit 01/20/18 for hospital follow-up for chest pain. He wrote, "Chest pain: THis has been very atypical in the past.  We did not pursue stress testing or cath when I saw him due to the nature of his sx.  He has done well and I have not seen him since 2016.  We again went over his symptoms that brought him to the hospital.  He currently has no discomfort in his chest.  He complains of only back pain and an abdominal fullness which is relieved when he has a bowel movement.  We had a long discussion regarding further cardiac workup.  Since his most limiting issue is his back which causes him to constantly bend forward and use a walker, along with taking chronic opioids, but we will not pursue any cardiac testing.  He would not be able to walk on a treadmill.  When I  described a pharmacologic stress test, he was even less interested.  He knows to seek medical attention if he has significant chest discomfort.  I think for this patient, if he ruled in for MI, he would be a candidate for angiogram.  Otherwise, we will try to manage conservatively.  Both the patient nad his son are in agreement about avoiding the risks of testing based on his current sx." I reviewed above with anesthesiologist Dr. Renold Don with recommendation to discuss surgery plans with Dr. Irish Lack. I called and spoke with Dr. Irish Lack about recent hospitalization and surgery plans. Currently, patient would not be an ideal candidate for anticoagulation given his recent GI bleed and GIST tumor diagnosis, and Dr. Irish Lack would not be inclined to pursue preoperative ischemic evaluation in an asymptomatic patient. Mr. Sine denied SOB and CP at PAT.   Meds include gabapentin, melatonin, Remeron, Movantik, Opana.   BP (!) 93/47 Comment: taken in R arm  Pulse 79   Temp 36.7 C   Resp 18   Ht 5\' 9"  (1.753 m)   Wt 150 lb (68 kg)   SpO2 95%   BMI 22.15 kg/m  BP 92/50, 93/47. Previous BP 02/24/18 118/55.   EKG 02/16/18: SR, atrial premature complex, right bundle branch block and left anterior fascicular block, borderline ST elevation, lateral leads.  No significant change since last tracing 12/30/17. (He has had incomplete RBBB and LAFB dating back to at least 2014. He progressed to RBBB sometime before 07/20/16.)   Echo 12/31/17: Study Conclusions - Left ventricle: The cavity size was normal. Systolic function was  vigorous. The estimated ejection fraction was in the range of 65%   to 70%. Images were inadequate for LV wall motion assessment.   Left ventricular diastolic function parameters were normal. - Aortic valve: Poorly visualized. Trileaflet; normal thickness,   mildly calcified leaflets. There was trivial regurgitation. - Mitral valve: Calcified annulus. - Left atrium: The atrium was  mildly dilated. - Pulmonary arteries: Systolic pressure could not be accurately   estimated.  Carotid U/S 05/11/13: Impression: Doppler velocities suggest less than 40% stenoses of the bilateral proximal internal carotid arteries.  CTA chest 12/30/17: IMPRESSION: - No evidence of pulmonary embolus. - Mild aneurysmal dilatation of the ascending thoracic aorta, 4.3 cm. Recommend annual imaging followup by CTA or MRA. This recommendation follows 2010 ACCF/AHA/AATS/ACR/ASA/SCA/SCAI/SIR/STS/SVM Guidelines for the Diagnosis and Management of Patients with Thoracic Aortic Disease. Circulation. 2010; 121: W466-Z993 - Cardiomegaly. - Bibasilar atelectasis.  CXR 12/30/17: IMPRESSION: Bibasilar curvilinear lung opacities, compatible with scarring or atelectasis.  Preoperative labs noted. Cr 1.03. Glucose 106. H/H 10.6/35.4, PLT 204. T&S done.   Mr. Beehler is without known history of CAD, but has seen cardiology intermittently over the years. Last visit was 01/2018 for atypical chest pain. Ultimately decision was made not to pursue ischemic evaluation. As above, I spoke with Dr. Beau Fanny today. If Mr. Kampf is without worrisome CV symptoms then it is anticipated that he can proceed as planned. Anesthesiologist to further evaluate on the day of surgery. I will send Dr. Barry Dienes a staff message with update.  George Hugh Spark M. [facility] Short Stay Center/Anesthesiology Phone [phone] 03/11/2018 2:46 PM

## 2018-03-12 ENCOUNTER — Encounter (HOSPITAL_COMMUNITY)
Admission: RE | Disposition: A | Discharge status: Skilled Nursing Facility | Payer: Self-pay | Source: Ambulatory Visit | Attending: General Surgery

## 2018-03-12 ENCOUNTER — Inpatient Hospital Stay (HOSPITAL_COMMUNITY)
Admission: RE | Admit: 2018-03-12 | Discharge: 2018-03-24 | DRG: 981 | Disposition: A | Discharge status: Skilled Nursing Facility | Payer: Medicare Other | Source: Ambulatory Visit | Attending: Internal Medicine | Admitting: Internal Medicine

## 2018-03-12 ENCOUNTER — Inpatient Hospital Stay (HOSPITAL_COMMUNITY): Payer: Medicare Other | Admitting: Certified Registered Nurse Anesthetist

## 2018-03-12 ENCOUNTER — Encounter (HOSPITAL_COMMUNITY): Payer: Self-pay | Admitting: Urology

## 2018-03-12 ENCOUNTER — Inpatient Hospital Stay (HOSPITAL_COMMUNITY): Payer: Medicare Other | Admitting: Vascular Surgery

## 2018-03-12 DIAGNOSIS — Z6822 Body mass index (BMI) 22.0-22.9, adult: Secondary | ICD-10-CM

## 2018-03-12 DIAGNOSIS — N179 Acute kidney failure, unspecified: Secondary | ICD-10-CM | POA: Diagnosis not present

## 2018-03-12 DIAGNOSIS — Z9079 Acquired absence of other genital organ(s): Secondary | ICD-10-CM

## 2018-03-12 DIAGNOSIS — Z79891 Long term (current) use of opiate analgesic: Secondary | ICD-10-CM | POA: Diagnosis not present

## 2018-03-12 DIAGNOSIS — Z905 Acquired absence of kidney: Secondary | ICD-10-CM | POA: Diagnosis not present

## 2018-03-12 DIAGNOSIS — G8929 Other chronic pain: Secondary | ICD-10-CM | POA: Diagnosis not present

## 2018-03-12 DIAGNOSIS — Z85528 Personal history of other malignant neoplasm of kidney: Secondary | ICD-10-CM | POA: Diagnosis not present

## 2018-03-12 DIAGNOSIS — J90 Pleural effusion, not elsewhere classified: Secondary | ICD-10-CM | POA: Diagnosis not present

## 2018-03-12 DIAGNOSIS — G894 Chronic pain syndrome: Secondary | ICD-10-CM | POA: Diagnosis present

## 2018-03-12 DIAGNOSIS — C49A2 Gastrointestinal stromal tumor of stomach: Secondary | ICD-10-CM | POA: Diagnosis present

## 2018-03-12 DIAGNOSIS — Z79899 Other long term (current) drug therapy: Secondary | ICD-10-CM | POA: Diagnosis not present

## 2018-03-12 DIAGNOSIS — J690 Pneumonitis due to inhalation of food and vomit: Secondary | ICD-10-CM | POA: Diagnosis not present

## 2018-03-12 DIAGNOSIS — Z8249 Family history of ischemic heart disease and other diseases of the circulatory system: Secondary | ICD-10-CM | POA: Diagnosis not present

## 2018-03-12 DIAGNOSIS — Z8546 Personal history of malignant neoplasm of prostate: Secondary | ICD-10-CM

## 2018-03-12 DIAGNOSIS — K5909 Other constipation: Secondary | ICD-10-CM | POA: Diagnosis not present

## 2018-03-12 DIAGNOSIS — G4733 Obstructive sleep apnea (adult) (pediatric): Secondary | ICD-10-CM | POA: Diagnosis present

## 2018-03-12 DIAGNOSIS — D62 Acute posthemorrhagic anemia: Secondary | ICD-10-CM | POA: Diagnosis not present

## 2018-03-12 DIAGNOSIS — F329 Major depressive disorder, single episode, unspecified: Secondary | ICD-10-CM | POA: Diagnosis present

## 2018-03-12 DIAGNOSIS — A419 Sepsis, unspecified organism: Secondary | ICD-10-CM | POA: Diagnosis not present

## 2018-03-12 DIAGNOSIS — D72829 Elevated white blood cell count, unspecified: Secondary | ICD-10-CM

## 2018-03-12 DIAGNOSIS — R0989 Other specified symptoms and signs involving the circulatory and respiratory systems: Secondary | ICD-10-CM

## 2018-03-12 DIAGNOSIS — E46 Unspecified protein-calorie malnutrition: Secondary | ICD-10-CM | POA: Diagnosis present

## 2018-03-12 DIAGNOSIS — Z5331 Laparoscopic surgical procedure converted to open procedure: Secondary | ICD-10-CM | POA: Diagnosis not present

## 2018-03-12 DIAGNOSIS — J189 Pneumonia, unspecified organism: Secondary | ICD-10-CM

## 2018-03-12 DIAGNOSIS — Z931 Gastrostomy status: Secondary | ICD-10-CM | POA: Diagnosis not present

## 2018-03-12 DIAGNOSIS — R197 Diarrhea, unspecified: Secondary | ICD-10-CM | POA: Diagnosis not present

## 2018-03-12 DIAGNOSIS — J918 Pleural effusion in other conditions classified elsewhere: Secondary | ICD-10-CM | POA: Diagnosis not present

## 2018-03-12 DIAGNOSIS — Z6823 Body mass index (BMI) 23.0-23.9, adult: Secondary | ICD-10-CM | POA: Diagnosis not present

## 2018-03-12 DIAGNOSIS — G2581 Restless legs syndrome: Secondary | ICD-10-CM | POA: Diagnosis present

## 2018-03-12 DIAGNOSIS — R0902 Hypoxemia: Secondary | ICD-10-CM | POA: Diagnosis not present

## 2018-03-12 DIAGNOSIS — G4700 Insomnia, unspecified: Secondary | ICD-10-CM | POA: Diagnosis present

## 2018-03-12 DIAGNOSIS — J9811 Atelectasis: Secondary | ICD-10-CM

## 2018-03-12 DIAGNOSIS — Z85028 Personal history of other malignant neoplasm of stomach: Secondary | ICD-10-CM | POA: Diagnosis not present

## 2018-03-12 DIAGNOSIS — F339 Major depressive disorder, recurrent, unspecified: Secondary | ICD-10-CM | POA: Diagnosis not present

## 2018-03-12 DIAGNOSIS — Z903 Acquired absence of stomach [part of]: Secondary | ICD-10-CM | POA: Diagnosis not present

## 2018-03-12 HISTORY — PX: LAPAROSCOPIC GASTRECTOMY: SHX5894

## 2018-03-12 HISTORY — PX: LAPAROSCOPIC PARTIAL GASTRECTOMY: SHX1933

## 2018-03-12 LAB — CBC
HEMATOCRIT: 28.4 % — AB (ref 39.0–52.0)
HEMOGLOBIN: 8.8 g/dL — AB (ref 13.0–17.0)
MCH: 28.1 pg (ref 26.0–34.0)
MCHC: 31 g/dL (ref 30.0–36.0)
MCV: 90.7 fL (ref 78.0–100.0)
Platelets: 173 10*3/uL (ref 150–400)
RBC: 3.13 MIL/uL — AB (ref 4.22–5.81)
RDW: 14.6 % (ref 11.5–15.5)
WBC: 7.1 10*3/uL (ref 4.0–10.5)

## 2018-03-12 LAB — CREATININE, SERUM
Creatinine, Ser: 0.91 mg/dL (ref 0.61–1.24)
GFR calc Af Amer: >60 mL/min (ref >60)

## 2018-03-12 SURGERY — GASTRECTOMY, LAPAROSCOPIC
Anesthesia: General | Site: Abdomen

## 2018-03-12 MED ORDER — SODIUM CHLORIDE 0.9% FLUSH: 9.0000 mL | INTRAVENOUS | Status: DC | PRN

## 2018-03-12 MED ORDER — BISACODYL 10 MG RE SUPP: 10.0000 mg | RECTAL | Status: DC | PRN

## 2018-03-12 MED ORDER — ENOXAPARIN SODIUM 40 MG/0.4ML ~~LOC~~ SOLN
40.0000 mg | SUBCUTANEOUS | Status: DC
  Administered 2018-03-13 – 2018-03-21 (×9): 40 mg via SUBCUTANEOUS
  Filled 2018-03-12 (×10): qty 0.4

## 2018-03-12 MED ORDER — BUPIVACAINE LIPOSOME 1.3 % IJ SUSP
20.0000 mL | INTRAMUSCULAR | Status: DC
  Filled 2018-03-12: qty 20

## 2018-03-12 MED ORDER — EPHEDRINE 5 MG/ML INJ
INTRAVENOUS | Status: AC
  Filled 2018-03-12: qty 10

## 2018-03-12 MED ORDER — PHENYLEPHRINE HCL 10 MG/ML IJ SOLN
INTRAMUSCULAR | Status: DC | PRN
  Administered 2018-03-12: 80 ug via INTRAVENOUS
  Administered 2018-03-12: 120 ug via INTRAVENOUS

## 2018-03-12 MED ORDER — METHOCARBAMOL 1000 MG/10ML IJ SOLN
500.0000 mg | Freq: Three times a day (TID) | INTRAVENOUS | Status: DC
  Administered 2018-03-12 – 2018-03-20 (×22): 500 mg via INTRAVENOUS
  Filled 2018-03-12: qty 5
  Filled 2018-03-12: qty 550
  Filled 2018-03-12: qty 5
  Filled 2018-03-12: qty 550
  Filled 2018-03-12 (×3): qty 5
  Filled 2018-03-12: qty 550
  Filled 2018-03-12: qty 5
  Filled 2018-03-12: qty 550
  Filled 2018-03-12 (×4): qty 5
  Filled 2018-03-12: qty 550
  Filled 2018-03-12 (×9): qty 5
  Filled 2018-03-12: qty 550
  Filled 2018-03-12: qty 5
  Filled 2018-03-12: qty 550

## 2018-03-12 MED ORDER — EPHEDRINE SULFATE 50 MG/ML IJ SOLN
INTRAMUSCULAR | Status: DC | PRN
  Administered 2018-03-12: 10 mg via INTRAVENOUS
  Administered 2018-03-12 (×3): 5 mg via INTRAVENOUS

## 2018-03-12 MED ORDER — FENTANYL CITRATE (PF) 250 MCG/5ML IJ SOLN
INTRAMUSCULAR | Status: AC
  Filled 2018-03-12: qty 5

## 2018-03-12 MED ORDER — SUCCINYLCHOLINE CHLORIDE 200 MG/10ML IV SOSY
PREFILLED_SYRINGE | INTRAVENOUS | Status: AC
  Filled 2018-03-12: qty 10

## 2018-03-12 MED ORDER — LIDOCAINE 2% (20 MG/ML) 5 ML SYRINGE
INTRAMUSCULAR | Status: DC | PRN
  Administered 2018-03-12: 60 mg via INTRAVENOUS

## 2018-03-12 MED ORDER — SOD CITRATE-CITRIC ACID 500-334 MG/5ML PO SOLN
30.0000 mL | Freq: Once | ORAL | Status: AC
  Administered 2018-03-12: 30 mL via ORAL
  Filled 2018-03-12: qty 30

## 2018-03-12 MED ORDER — ONDANSETRON HCL 4 MG/2ML IJ SOLN
4.0000 mg | Freq: Four times a day (QID) | INTRAMUSCULAR | Status: DC | PRN
  Administered 2018-03-14: 4 mg via INTRAVENOUS
  Filled 2018-03-12 (×2): qty 2

## 2018-03-12 MED ORDER — MELATONIN 3 MG PO TABS
3.0000 mg | ORAL_TABLET | Freq: Every day | ORAL | Status: DC
  Administered 2018-03-13 – 2018-03-23 (×10): 3 mg via ORAL
  Filled 2018-03-12 (×11): qty 1

## 2018-03-12 MED ORDER — FENTANYL CITRATE (PF) 100 MCG/2ML IJ SOLN
INTRAMUSCULAR | Status: AC
  Filled 2018-03-12: qty 2

## 2018-03-12 MED ORDER — DIPHENHYDRAMINE HCL 50 MG/ML IJ SOLN: 12.5000 mg | Freq: Four times a day (QID) | INTRAMUSCULAR | Status: DC | PRN

## 2018-03-12 MED ORDER — NALOXONE HCL 0.4 MG/ML IJ SOLN: 0.4000 mg | INTRAMUSCULAR | Status: DC | PRN

## 2018-03-12 MED ORDER — FAMOTIDINE IN NACL 20-0.9 MG/50ML-% IV SOLN
INTRAVENOUS | Status: AC
  Administered 2018-03-12: 20 mg via INTRAVENOUS
  Filled 2018-03-12: qty 50

## 2018-03-12 MED ORDER — SODIUM CHLORIDE 0.9 % IV SOLN
INTRAVENOUS | Status: DC | PRN
  Administered 2018-03-12: 50 mL

## 2018-03-12 MED ORDER — 0.9 % SODIUM CHLORIDE (POUR BTL) OPTIME
TOPICAL | Status: DC | PRN
  Administered 2018-03-12: 1000 mL

## 2018-03-12 MED ORDER — LIDOCAINE HCL 1 % IJ SOLN
INTRAMUSCULAR | Status: DC | PRN
  Administered 2018-03-12: 7 mL

## 2018-03-12 MED ORDER — CHLORHEXIDINE GLUCONATE CLOTH 2 % EX PADS: 6.0000 | MEDICATED_PAD | Freq: Once | CUTANEOUS | Status: DC

## 2018-03-12 MED ORDER — CETAPHIL MOISTURIZING EX LOTN
1.0000 "application " | TOPICAL_LOTION | Freq: Four times a day (QID) | CUTANEOUS | Status: DC | PRN
  Administered 2018-03-14: 1 via TOPICAL
  Filled 2018-03-12: qty 118

## 2018-03-12 MED ORDER — POLYVINYL ALCOHOL 1.4 % OP SOLN
1.0000 [drp] | Freq: Three times a day (TID) | OPHTHALMIC | Status: DC | PRN
  Filled 2018-03-12: qty 15

## 2018-03-12 MED ORDER — DIPHENHYDRAMINE HCL 12.5 MG/5ML PO ELIX: 12.5000 mg | ORAL_SOLUTION | Freq: Four times a day (QID) | ORAL | Status: DC | PRN

## 2018-03-12 MED ORDER — ONDANSETRON HCL 4 MG/2ML IJ SOLN: 4.0000 mg | Freq: Once | INTRAMUSCULAR | Status: DC | PRN

## 2018-03-12 MED ORDER — CEFAZOLIN SODIUM-DEXTROSE 2-4 GM/100ML-% IV SOLN
2.0000 g | Freq: Three times a day (TID) | INTRAVENOUS | Status: AC
  Administered 2018-03-12: 2 g via INTRAVENOUS
  Filled 2018-03-12: qty 100

## 2018-03-12 MED ORDER — PROPOFOL 500 MG/50ML IV EMUL
INTRAVENOUS | Status: DC | PRN
  Administered 2018-03-12: 15 ug/kg/min via INTRAVENOUS

## 2018-03-12 MED ORDER — FENTANYL CITRATE (PF) 250 MCG/5ML IJ SOLN
INTRAMUSCULAR | Status: DC | PRN
  Administered 2018-03-12: 50 ug via INTRAVENOUS
  Administered 2018-03-12: 100 ug via INTRAVENOUS

## 2018-03-12 MED ORDER — NALOXONE HCL 0.4 MG/ML IJ SOLN
0.4000 mg | INTRAMUSCULAR | Status: DC | PRN
Start: 2018-03-12 — End: 2018-03-12

## 2018-03-12 MED ORDER — METHOCARBAMOL 1000 MG/10ML IJ SOLN
500.0000 mg | Freq: Three times a day (TID) | INTRAMUSCULAR | Status: DC | PRN
  Filled 2018-03-12 (×2): qty 5

## 2018-03-12 MED ORDER — PROCHLORPERAZINE EDISYLATE 5 MG/ML IJ SOLN
5.0000 mg | Freq: Four times a day (QID) | INTRAMUSCULAR | Status: DC | PRN
  Filled 2018-03-12 (×2): qty 2

## 2018-03-12 MED ORDER — MORPHINE SULFATE 2 MG/ML IV SOLN
INTRAVENOUS | Status: DC
  Administered 2018-03-12: 13:00:00 via INTRAVENOUS
  Filled 2018-03-12: qty 30

## 2018-03-12 MED ORDER — NALOXEGOL OXALATE 25 MG PO TABS
25.0000 mg | ORAL_TABLET | Freq: Every day | ORAL | Status: DC
  Administered 2018-03-13 – 2018-03-17 (×5): 25 mg via ORAL
  Filled 2018-03-12 (×9): qty 1

## 2018-03-12 MED ORDER — PHENYLEPHRINE 40 MCG/ML (10ML) SYRINGE FOR IV PUSH (FOR BLOOD PRESSURE SUPPORT)
PREFILLED_SYRINGE | INTRAVENOUS | Status: AC
  Filled 2018-03-12: qty 10

## 2018-03-12 MED ORDER — PHENYLEPHRINE HCL 10 MG/ML IJ SOLN
INTRAVENOUS | Status: DC | PRN
  Administered 2018-03-12: 25 ug/min via INTRAVENOUS

## 2018-03-12 MED ORDER — KCL IN DEXTROSE-NACL 20-5-0.45 MEQ/L-%-% IV SOLN
INTRAVENOUS | Status: DC
  Administered 2018-03-12 – 2018-03-20 (×10): via INTRAVENOUS
  Filled 2018-03-12 (×13): qty 1000

## 2018-03-12 MED ORDER — GABAPENTIN 300 MG PO CAPS
300.0000 mg | ORAL_CAPSULE | ORAL | Status: DC
  Filled 2018-03-12: qty 1

## 2018-03-12 MED ORDER — ROCURONIUM BROMIDE 10 MG/ML (PF) SYRINGE
PREFILLED_SYRINGE | INTRAVENOUS | Status: DC | PRN
  Administered 2018-03-12: 20 mg via INTRAVENOUS
  Administered 2018-03-12: 40 mg via INTRAVENOUS

## 2018-03-12 MED ORDER — PROPOFOL 10 MG/ML IV BOLUS
INTRAVENOUS | Status: DC | PRN
  Administered 2018-03-12: 120 mg via INTRAVENOUS

## 2018-03-12 MED ORDER — SUGAMMADEX SODIUM 200 MG/2ML IV SOLN
INTRAVENOUS | Status: AC
  Filled 2018-03-12: qty 2

## 2018-03-12 MED ORDER — ACETAMINOPHEN 10 MG/ML IV SOLN
1000.0000 mg | Freq: Four times a day (QID) | INTRAVENOUS | Status: AC
  Administered 2018-03-12 – 2018-03-13 (×4): 1000 mg via INTRAVENOUS
  Filled 2018-03-12 (×4): qty 100

## 2018-03-12 MED ORDER — LACTATED RINGERS IV SOLN
INTRAVENOUS | Status: DC
  Administered 2018-03-12 (×2): via INTRAVENOUS

## 2018-03-12 MED ORDER — DEXAMETHASONE SODIUM PHOSPHATE 10 MG/ML IJ SOLN
INTRAMUSCULAR | Status: AC
  Filled 2018-03-12: qty 1

## 2018-03-12 MED ORDER — GABAPENTIN 300 MG PO CAPS
300.0000 mg | ORAL_CAPSULE | Freq: Three times a day (TID) | ORAL | Status: DC
  Administered 2018-03-13 – 2018-03-24 (×31): 300 mg via ORAL
  Filled 2018-03-12 (×30): qty 1

## 2018-03-12 MED ORDER — LIDOCAINE HCL (PF) 1 % IJ SOLN
INTRAMUSCULAR | Status: AC
  Filled 2018-03-12: qty 30

## 2018-03-12 MED ORDER — OXYCODONE HCL 5 MG PO TABS: 5.0000 mg | ORAL_TABLET | Freq: Once | ORAL | Status: DC | PRN

## 2018-03-12 MED ORDER — SALINE SPRAY 0.65 % NA SOLN
1.0000 | Freq: Four times a day (QID) | NASAL | Status: DC | PRN
  Filled 2018-03-12: qty 44

## 2018-03-12 MED ORDER — HYDROMORPHONE 1 MG/ML IV SOLN
INTRAVENOUS | Status: DC
  Administered 2018-03-12: 20:00:00 via INTRAVENOUS
  Administered 2018-03-12: 0.6 mg via INTRAVENOUS
  Administered 2018-03-13: 0.3 mg via INTRAVENOUS
  Administered 2018-03-13: 0 mg via INTRAVENOUS
  Administered 2018-03-13: 2.4 mg via INTRAVENOUS
  Administered 2018-03-13: 0.6 mg via INTRAVENOUS
  Administered 2018-03-13: 1.8 mg via INTRAVENOUS
  Administered 2018-03-14: 0.6 mg via INTRAVENOUS
  Administered 2018-03-14: 6 mg via INTRAVENOUS
  Administered 2018-03-14: 0.3 mg via INTRAVENOUS
  Administered 2018-03-14: 0.6 mg via INTRAVENOUS
  Administered 2018-03-14 – 2018-03-15 (×2): 0.3 mg via INTRAVENOUS
  Administered 2018-03-15: 0.6 mg via INTRAVENOUS
  Administered 2018-03-15: 1.5 mg via INTRAVENOUS
  Administered 2018-03-15: 0.6 mg via INTRAVENOUS
  Administered 2018-03-15: 0.3 mg via INTRAVENOUS
  Administered 2018-03-15: 0.6 mg via INTRAVENOUS
  Administered 2018-03-16: 0.9 mg via INTRAVENOUS
  Administered 2018-03-16: 1.5 mg via INTRAVENOUS
  Administered 2018-03-16: 0.3 mg via INTRAVENOUS
  Administered 2018-03-16: 0 mg via INTRAVENOUS
  Administered 2018-03-16: 0.9 mg via INTRAVENOUS
  Administered 2018-03-17: 0 mg via INTRAVENOUS
  Administered 2018-03-17 (×2): 0.3 mg via INTRAVENOUS
  Filled 2018-03-12: qty 25

## 2018-03-12 MED ORDER — ONDANSETRON HCL 4 MG/2ML IJ SOLN
INTRAMUSCULAR | Status: DC | PRN
  Administered 2018-03-12: 4 mg via INTRAVENOUS

## 2018-03-12 MED ORDER — LIDOCAINE IN D5W 4-5 MG/ML-% IV SOLN
INTRAVENOUS | Status: DC | PRN
  Administered 2018-03-12: 25 ug/kg/min via INTRAVENOUS

## 2018-03-12 MED ORDER — DIPHENHYDRAMINE HCL 50 MG/ML IJ SOLN
12.5000 mg | Freq: Four times a day (QID) | INTRAMUSCULAR | Status: DC | PRN
  Administered 2018-03-12: 12.5 mg via INTRAVENOUS
  Filled 2018-03-12: qty 1

## 2018-03-12 MED ORDER — EVICEL 5 ML EX KIT
PACK | CUTANEOUS | Status: AC
  Filled 2018-03-12: qty 1

## 2018-03-12 MED ORDER — FAMOTIDINE IN NACL 20-0.9 MG/50ML-% IV SOLN
20.0000 mg | Freq: Once | INTRAVENOUS | Status: AC
  Administered 2018-03-12: 20 mg via INTRAVENOUS

## 2018-03-12 MED ORDER — PANTOPRAZOLE SODIUM 40 MG IV SOLR
40.0000 mg | Freq: Every day | INTRAVENOUS | Status: DC
  Administered 2018-03-12 – 2018-03-13 (×2): 40 mg via INTRAVENOUS
  Filled 2018-03-12 (×2): qty 40

## 2018-03-12 MED ORDER — ONDANSETRON HCL 4 MG/2ML IJ SOLN
INTRAMUSCULAR | Status: AC
  Filled 2018-03-12: qty 2

## 2018-03-12 MED ORDER — CEFAZOLIN SODIUM-DEXTROSE 2-4 GM/100ML-% IV SOLN
2.0000 g | INTRAVENOUS | Status: AC
  Administered 2018-03-12: 2 g via INTRAVENOUS
  Filled 2018-03-12: qty 100

## 2018-03-12 MED ORDER — FENTANYL CITRATE (PF) 100 MCG/2ML IJ SOLN
25.0000 ug | INTRAMUSCULAR | Status: DC | PRN
  Administered 2018-03-12 (×4): 25 ug via INTRAVENOUS

## 2018-03-12 MED ORDER — MIRTAZAPINE 7.5 MG PO TABS
7.5000 mg | ORAL_TABLET | Freq: Every day | ORAL | Status: DC
  Administered 2018-03-13 – 2018-03-23 (×10): 7.5 mg via ORAL
  Filled 2018-03-12 (×10): qty 1

## 2018-03-12 MED ORDER — ACETAMINOPHEN 500 MG PO TABS
1000.0000 mg | ORAL_TABLET | ORAL | Status: AC
  Administered 2018-03-12: 1000 mg via ORAL
  Filled 2018-03-12: qty 2

## 2018-03-12 MED ORDER — BUPIVACAINE-EPINEPHRINE (PF) 0.25% -1:200000 IJ SOLN
INTRAMUSCULAR | Status: AC
  Filled 2018-03-12: qty 30

## 2018-03-12 MED ORDER — SUCCINYLCHOLINE CHLORIDE 20 MG/ML IJ SOLN
INTRAMUSCULAR | Status: DC | PRN
  Administered 2018-03-12: 120 mg via INTRAVENOUS

## 2018-03-12 MED ORDER — ONDANSETRON HCL 4 MG/2ML IJ SOLN: 4.0000 mg | Freq: Four times a day (QID) | INTRAMUSCULAR | Status: DC | PRN

## 2018-03-12 MED ORDER — HYDRALAZINE HCL 20 MG/ML IJ SOLN: 10.0000 mg | INTRAMUSCULAR | Status: DC | PRN

## 2018-03-12 MED ORDER — OXYCODONE HCL 5 MG/5ML PO SOLN: 5.0000 mg | Freq: Once | ORAL | Status: DC | PRN

## 2018-03-12 MED ORDER — PROCHLORPERAZINE MALEATE 10 MG PO TABS
10.0000 mg | ORAL_TABLET | Freq: Four times a day (QID) | ORAL | Status: DC | PRN
  Filled 2018-03-12: qty 1

## 2018-03-12 SURGICAL SUPPLY — 79 items
APPLIER CLIP 5 13 M/L LIGAMAX5 (MISCELLANEOUS)
BLADE CLIPPER SURG (BLADE) IMPLANT
BLADE SURG 10 STRL SS (BLADE) ×3 IMPLANT
CANISTER SUCT 3000ML PPV (MISCELLANEOUS) ×3 IMPLANT
CELLS DAT CNTRL 66122 CELL SVR (MISCELLANEOUS) IMPLANT
CHLORAPREP W/TINT 26ML (MISCELLANEOUS) ×3 IMPLANT
CLIP APPLIE 5 13 M/L LIGAMAX5 (MISCELLANEOUS) IMPLANT
CLIP VESOLOCK MED LG 6/CT (CLIP) ×3 IMPLANT
COVER MAYO STAND STRL (DRAPES) IMPLANT
COVER SURGICAL LIGHT HANDLE (MISCELLANEOUS) ×3 IMPLANT
DRAPE HALF SHEET 40X57 (DRAPES) IMPLANT
DRAPE UTILITY XL STRL (DRAPES) IMPLANT
DRSG OPSITE POSTOP 4X10 (GAUZE/BANDAGES/DRESSINGS) ×3 IMPLANT
DRSG OPSITE POSTOP 4X8 (GAUZE/BANDAGES/DRESSINGS) ×3 IMPLANT
DRSG TEGADERM 2-3/8X2-3/4 SM (GAUZE/BANDAGES/DRESSINGS) ×3 IMPLANT
ELECT BLADE 6.5 EXT (BLADE) IMPLANT
ELECT CAUTERY BLADE 6.4 (BLADE) ×3 IMPLANT
ELECT REM PT RETURN 9FT ADLT (ELECTROSURGICAL) ×3
ELECTRODE REM PT RTRN 9FT ADLT (ELECTROSURGICAL) ×1 IMPLANT
GAUZE SPONGE 2X2 8PLY STRL LF (GAUZE/BANDAGES/DRESSINGS) ×1 IMPLANT
GEL ULTRASOUND 20GR AQUASONIC (MISCELLANEOUS) IMPLANT
GLOVE BIO SURGEON STRL SZ 6 (GLOVE) ×9 IMPLANT
GLOVE BIO SURGEON STRL SZ7.5 (GLOVE) ×6 IMPLANT
GLOVE BIOGEL PI IND STRL 6 (GLOVE) ×1 IMPLANT
GLOVE BIOGEL PI IND STRL 7.0 (GLOVE) ×1 IMPLANT
GLOVE BIOGEL PI IND STRL 7.5 (GLOVE) ×2 IMPLANT
GLOVE BIOGEL PI INDICATOR 6 (GLOVE) ×2
GLOVE BIOGEL PI INDICATOR 7.0 (GLOVE) ×2
GLOVE BIOGEL PI INDICATOR 7.5 (GLOVE) ×4
GLOVE ECLIPSE 7.5 STRL STRAW (GLOVE) ×3 IMPLANT
GLOVE INDICATOR 6.5 STRL GRN (GLOVE) ×3 IMPLANT
GOWN STRL REUS W/ TWL LRG LVL3 (GOWN DISPOSABLE) ×2 IMPLANT
GOWN STRL REUS W/TWL 2XL LVL3 (GOWN DISPOSABLE) ×3 IMPLANT
GOWN STRL REUS W/TWL LRG LVL3 (GOWN DISPOSABLE) ×4
KIT BASIN OR (CUSTOM PROCEDURE TRAY) ×3 IMPLANT
KIT TURNOVER KIT B (KITS) ×3 IMPLANT
LIGASURE IMPACT 36 18CM CVD LR (INSTRUMENTS) IMPLANT
NS IRRIG 1000ML POUR BTL (IV SOLUTION) ×6 IMPLANT
PAD ARMBOARD 7.5X6 YLW CONV (MISCELLANEOUS) ×6 IMPLANT
PENCIL BUTTON HOLSTER BLD 10FT (ELECTRODE) ×3 IMPLANT
RELOAD STAPLER BLUE 60MM (STAPLE) ×5 IMPLANT
RTRCTR WOUND ALEXIS 18CM MED (MISCELLANEOUS)
SCISSORS LAP 5X35 DISP (ENDOMECHANICALS) ×3 IMPLANT
SET IRRIG TUBING LAPAROSCOPIC (IRRIGATION / IRRIGATOR) IMPLANT
SHEARS HARMONIC ACE PLUS 36CM (ENDOMECHANICALS) ×3 IMPLANT
SLEEVE ENDOPATH XCEL 5M (ENDOMECHANICALS) ×3 IMPLANT
SPECIMEN JAR LARGE (MISCELLANEOUS) ×3 IMPLANT
SPONGE GAUZE 2X2 STER 10/PKG (GAUZE/BANDAGES/DRESSINGS) ×2
SPONGE LAP 18X18 X RAY DECT (DISPOSABLE) ×9 IMPLANT
STAPLE ECHEON FLEX 60 POW ENDO (STAPLE) ×3 IMPLANT
STAPLER RELOAD BLUE 60MM (STAPLE) ×15
STAPLER VISISTAT 35W (STAPLE) ×6 IMPLANT
SUCTION POOLE TIP (SUCTIONS) ×3 IMPLANT
SURGILUBE 2OZ TUBE FLIPTOP (MISCELLANEOUS) IMPLANT
SUT PDS AB 1 TP1 96 (SUTURE) ×6 IMPLANT
SUT PROLENE 2 0 CT2 30 (SUTURE) IMPLANT
SUT PROLENE 2 0 KS (SUTURE) IMPLANT
SUT SILK 2 0 SH CR/8 (SUTURE) ×3 IMPLANT
SUT SILK 2 0 TIES 10X30 (SUTURE) ×3 IMPLANT
SUT SILK 3 0 SH CR/8 (SUTURE) ×3 IMPLANT
SUT SILK 3 0 TIES 10X30 (SUTURE) ×3 IMPLANT
SUT VIC AB 2-0 SH 27 (SUTURE) ×2
SUT VIC AB 2-0 SH 27XBRD (SUTURE) ×1 IMPLANT
SYR BULB IRRIGATION 50ML (SYRINGE) ×6 IMPLANT
SYS LAPSCP GELPORT 120MM (MISCELLANEOUS) ×3
SYSTEM LAPSCP GELPORT 120MM (MISCELLANEOUS) ×1 IMPLANT
TOWEL OR 17X26 10 PK STRL BLUE (TOWEL DISPOSABLE) ×6 IMPLANT
TRAY FOLEY W/METER SILVER 16FR (SET/KITS/TRAYS/PACK) ×3 IMPLANT
TRAY LAPAROSCOPIC MC (CUSTOM PROCEDURE TRAY) ×3 IMPLANT
TRAY PROCTOSCOPIC FIBER OPTIC (SET/KITS/TRAYS/PACK) IMPLANT
TROCAR XCEL 12X100 BLDLESS (ENDOMECHANICALS) ×3 IMPLANT
TROCAR XCEL BLUNT TIP 100MML (ENDOMECHANICALS) IMPLANT
TROCAR XCEL NON-BLD 11X100MML (ENDOMECHANICALS) IMPLANT
TROCAR XCEL NON-BLD 5MMX100MML (ENDOMECHANICALS) ×6 IMPLANT
TUBE CONNECTING 12'X1/4 (SUCTIONS) ×2
TUBE CONNECTING 12X1/4 (SUCTIONS) ×4 IMPLANT
TUBING INSUF HEATED (TUBING) ×3 IMPLANT
WATER STERILE IRR 1000ML POUR (IV SOLUTION) ×3 IMPLANT
YANKAUER SUCT BULB TIP NO VENT (SUCTIONS) ×3 IMPLANT

## 2018-03-12 NOTE — Discharge Instructions (Signed)
[facility]      [facility] °[phone] ° °ABDOMINAL SURGERY: POST OP INSTRUCTIONS ° °Always review your discharge instruction sheet given to you by the facility where your surgery was performed. ° °IF YOU HAVE DISABILITY OR FAMILY LEAVE FORMS, YOU MUST BRING THEM TO THE OFFICE FOR PROCESSING.  PLEASE DO NOT GIVE THEM TO YOUR DOCTOR. ° °1. A prescription for pain medication may be given to you upon discharge.  Take your pain medication as prescribed, if needed.  If narcotic pain medicine is not needed, then you may take acetaminophen (Tylenol) or ibuprofen (Advil) as needed. °2. Take your usually prescribed medications unless otherwise directed. °3. If you need a refill on your pain medication, please contact your pharmacy. They will contact our office to request authorization.  Prescriptions will not be filled after 5pm or on week-ends. °4. You should follow a light diet the first few days after arrival home, such as soup and crackers, pudding, etc.unless your doctor has advised otherwise. A high-fiber, low fat diet can be resumed as tolerated.   Be sure to include lots of fluids daily. Most patients will experience some swelling and bruising on the chest and neck area.  Ice packs will help.  Swelling and bruising can take several days to resolve °5. Most patients will experience some swelling and bruising in the area of the incision. Ice pack will help. Swelling and bruising can take several days to resolve..  °6. It is common to experience some constipation if taking pain medication after surgery.  Increasing fluid intake and taking a stool softener will usually help or prevent this problem from occurring.  A mild laxative (Milk of Magnesia or Miralax) should be taken according to package directions if there are no bowel movements after 48 hours. °7.  You may have steri-strips (small skin tapes) in place directly over the incision.  These strips should be left on the skin for 10-14 days.  If your  surgeon used skin glue on the incision, you may shower in 48 hours.  The glue will flake off over the next 2-3 weeks.  Any sutures or staples will be removed at the office during your follow-up visit. You may find that a light gauze bandage over your incision may keep your staples from being rubbed or pulled. You may shower and replace the bandage daily. °8. ACTIVITIES:  You may resume regular (light) daily activities beginning the next day--such as daily self-care, walking, climbing stairs--gradually increasing activities as tolerated.  You may have sexual intercourse when it is comfortable.  Refrain from any heavy lifting or straining until approved by your doctor. °a. You may drive when you no longer are taking prescription pain medication, you can comfortably wear a seatbelt, and you can safely maneuver your car and apply brakes °b. Return to Work: __________8 weeks if applicable_________________________ °9. You should see your doctor in the office for a follow-up appointment approximately two weeks after your surgery.  Make sure that you call for this appointment within a day or two after you arrive home to insure a convenient appointment time. °OTHER INSTRUCTIONS:  °_____________________________________________________________ °_____________________________________________________________ ° °WHEN TO CALL YOUR DOCTOR: °1. Fever over 101.0 °2. Inability to urinate °3. Nausea and/or vomiting °4. Extreme swelling or bruising °5. Continued bleeding from incision. °6. Increased pain, redness, or drainage from the incision. °7. Difficulty swallowing or breathing °8. Muscle cramping or spasms. °9. Numbness or tingling in hands or feet or around lips. ° °The clinic staff is   available to answer your questions during regular business hours.  Please don’t hesitate to call and ask to speak to one of the nurses if you have concerns. ° °For further questions, please visit [email or web address] ° ° ° °

## 2018-03-12 NOTE — Anesthesia Preprocedure Evaluation (Addendum)
Anesthesia Evaluation  Patient identified by MRN, date of birth, ID band Patient awake    Reviewed: Allergy & Precautions, NPO status , Patient's Chart, lab work & pertinent test results  Airway Mallampati: II  TM Distance: >3 FB Neck ROM: Full    Dental  (+) Dental Advisory Given, Missing   Pulmonary asthma , sleep apnea ,    Pulmonary exam normal breath sounds clear to auscultation       Cardiovascular Normal cardiovascular exam+ dysrhythmias (Bundle branch block)  Rhythm:Regular Rate:Normal  EKG 02/16/18: SR, atrial premature complex, RBBB and LAFB, borderline ST elevation, lateral leads.  No significant change since 12/30/17.   Echo 12/31/17: EF 65%to 70%.Trivial AI. Mildly dilated LA.   Carotid U/S 05/11/13: Doppler velocities suggest less than 40% stenoses of the bilateral proximal internal carotid arteries.  CTA chest 12/30/17: Mild aneurysmal dilatation of the ascending thoracic aorta, 4.3 cm. Cardiomegaly. Bibasilar atelectasis.     Neuro/Psych Left eye blindness, restless legs negative psych ROS   GI/Hepatic Neg liver ROS, GIST - states has not had any N/V recently  CT Abd. - 9.7 x 8.8 x 10.9 cm aggressive appearing heterogeneously enhancing mass along the lesser curvature of the stomach, highly concerning for primary gastric neoplasm. This is intimately associated with the undersurface of the left lobe of the liver without definitive evidence of direct invasion (although superficial involvement of the hepatic capsule is suspected given the loss of the intervening fat plane). There is also an indeterminate left adrenal nodule measuring 2.0 x 1.4 cm, which could be metastatic.  Areas of cylindrical bronchiectasis and chronic scarring in the lower lobes of the lungs bilaterally. Aortic atherosclerosis, in addition to left main and 3 vessel coronary artery disease. Right kidney is either surgically or congenitally absent,  or severely atrophic.   Endo/Other  negative endocrine ROS  Renal/GU CRFRenal diseaseS/p right nephrectomy   Prostate cancer    Musculoskeletal  (+) Arthritis , Chronic pain syndrome; kyphoscoliosis   Abdominal   Peds  Hematology  (+) anemia ,   Anesthesia Other Findings   Reproductive/Obstetrics                           Anesthesia Physical  Anesthesia Plan  ASA: III  Anesthesia Plan: General   Post-op Pain Management:    Induction: Intravenous and Rapid sequence  PONV Risk Score and Plan: 4 or greater and Treatment may vary due to age or medical condition, Ondansetron and Propofol infusion  Airway Management Planned: Oral ETT  Additional Equipment: None  Intra-op Plan:   Post-operative Plan: Extubation in OR  Informed Consent: I have reviewed the patients History and Physical, chart, labs and discussed the procedure including the risks, benefits and alternatives for the proposed anesthesia with the patient or authorized representative who has indicated his/her understanding and acceptance.   Dental advisory given  Plan Discussed with: CRNA  Anesthesia Plan Comments:         Anesthesia Quick Evaluation

## 2018-03-12 NOTE — Anesthesia Postprocedure Evaluation (Signed)
Anesthesia Post Note  Patient: Casey Gates  Procedure(s) Performed: LAPAROSCOPIC ASSISTED PARTIAL GASTRECTOMY ERAS PATHWAY (N/A Abdomen)     Patient location during evaluation: PACU Anesthesia Type: General Level of consciousness: awake and alert Pain management: pain level controlled Vital Signs Assessment: post-procedure vital signs reviewed and stable Respiratory status: spontaneous breathing, nonlabored ventilation and respiratory function stable Cardiovascular status: blood pressure returned to baseline and stable Postop Assessment: no apparent nausea or vomiting Anesthetic complications: no    Last Vitals:  Vitals:   03/12/18 1345 03/12/18 1347  BP:  (!) 150/40  Pulse: 71 73  Resp: 10 16  Temp:    SpO2: 100% 100%    Last Pain:  Vitals:   03/12/18 1304  TempSrc:   PainSc: [place]

## 2018-03-12 NOTE — Interval H&P Note (Signed)
History and Physical Interval Note:  03/12/2018 9:14 AM  Casey Gates  has presented today for surgery, with the diagnosis of GIST stomach Gastrointestinal stromal tumor  The various methods of treatment have been discussed with the patient and family. After consideration of risks, benefits and other options for treatment, the patient has consented to  Procedure(s): Lena (N/A) as a surgical intervention .  The patient's history has been reviewed, patient examined, no change in status, stable for surgery.  I have reviewed the patient's chart and labs.  Questions were answered to the patient's satisfaction.     Stark Klein

## 2018-03-12 NOTE — Op Note (Signed)
PRE-OPERATIVE DIAGNOSIS: Malignant GIST lesser curve, 11 cm  POST-OPERATIVE DIAGNOSIS:  Same  PROCEDURE:  Procedure(s): Laparoscopic assisted converted to open partial gastrectomy  SURGEON:  Surgeon(s): Stark Klein, MD  ASSISTANT:   Sharyn Dross, RNFA  ANESTHESIA:   local and general  DRAINS: none   LOCAL MEDICATIONS USED:  Exparel  SPECIMEN:  Source of Specimen:  Gastric mass, lesser curve  DISPOSITION OF SPECIMEN:  PATHOLOGY  COUNTS:  YES  PLAN OF CARE: Admit to inpatient   PATIENT DISPOSITION:  PACU - hemodynamically stable.   FINDINGS:  Necrotic and partially cystic mass with multiple rubbery nodules exophytic on lesser curve, very vascular.  PROCEDURE:  Pt was identified in the holding area and taken to the operating room and placed supine on the operating room table.  General anesthesia was induced.  A foley catheter was placed.  The abdomen was prepped and draped in sterile fashion.  A timeout was performed according to the surgical safety checklist.  When all was correct, we continued.    A infraumbilical 10 mm incision was made after administration of local anesthetic.  The fascia was elevated and opened with a #11 blade.  A pursestring suture of 0 Vicryl was placed around the fascial incision.  A Hassan trocar was introduced in the abdomen and secured with the tails of the suture.  Pneumoperitoneum was achieved to pressure 15 mmHg.  The patient was then turned into reverse Trendelenburg and rotated to the left.  2 5 mm trochars were placed in the right abdomen.  The mass was visible and the omental attachments as well as filmy adhesions were taken down with the harmonic.  Due to the size and weight of the tumor, the [facility] port was upsized to a hand port.  The mass was then able to be grasped and manipulated.   The lesser omentum was opened with the harmonic.  There were several large vessels in this location going directly to the tumor.  They were too large for the  harmonic.  Clips were applied to the vessel but it was quite friable and started to bleed.  A decision was made to convert to open in order to maximize the safety and minimize bleeding.  The vessels were then identified and clamped with Kellys.  These were then divided with the harmonic and suture ligated with 2-0 silk sutures.  The mass was very carefully handled and elevated and taken off of all of the additional attachments until it just remained on the wall of the stomach.  Five fires of the Echelon were used to divide the portion of the stomach wall on the lesser curve with the tumor attached.  Care was taken to hold adequate pressure.  The stomach was examined with each staple load to make sure that the lumen was not too narrowed.  Once the tumor was completely freed it was placed off the table.  The staple line was examined and was seen not to be bleeding.  The stomach on the specimen was then opened and examined to make sure the gross margin was negative.    The abdomen was irrigated and inspected for hemostasis.  There were several small areas of oozing that were suture ligated.  The abdomen was then irrigated with warm water irrigation.  No evidence of bleeding was seen.    The preperitoneal space on both sides of the incision was infiltrated with Exparel.  The fascia was closed with running #1 looped PDS suture.  The skin was  closed with skin staples.  The wounds were cleaned, dried, and dressed with soft sterile dressings. Needle, sponge, and instrument counts were correct.  The patient was awakened from anesthesia and taken to the PACU in stable condition.

## 2018-03-12 NOTE — Anesthesia Procedure Notes (Signed)
Procedure Name: Intubation Date/Time: 03/12/2018 9:30 AM Performed by: Bryson Corona, CRNA Pre-anesthesia Checklist: Patient identified, Emergency Drugs available, Suction available and Patient being monitored Patient Re-evaluated:Patient Re-evaluated prior to induction Oxygen Delivery Method: Circle System Utilized Preoxygenation: Pre-oxygenation with 100% oxygen Induction Type: Rapid sequence and Cricoid Pressure applied Laryngoscope Size: Mac and 3 Grade View: Grade I Tube type: Oral Tube size: 7.0 mm Number of attempts: 1 Airway Equipment and Method: Stylet Placement Confirmation: ETT inserted through vocal cords under direct vision,  positive ETCO2 and breath sounds checked- equal and bilateral Secured at: 22 cm Tube secured with: Tape Dental Injury: Teeth and Oropharynx as per pre-operative assessment

## 2018-03-12 NOTE — Progress Notes (Signed)
Patient ID: Casey Gates, male   DOB: [birth year], 84 y.o.   MRN: [id number] Called by RN to evaluate patient with inadequate pain control. He reports morphine PCA is not controlling his pain. Abd soft with dry stain. Patient reports he takes oxycodone 10mg  4 times a day at home. Will change to dilaudid PCA and schedule robaxin.  Georganna Skeans, MD, MPH, FACS Trauma: [phone] General Surgery: [phone]

## 2018-03-12 NOTE — H&P (Signed)
Casey Gates Documented: 03/03/2018 3:09 PM Location: [place] Office Patient #: [id number] DOB: 02-21-1926 Widowed / Language: Casey Gates / Race: White Male   History of Present Illness Stark Klein MD; 03/03/2018 6:03 PM) The patient is a 84 year old male who presents for a Follow-up for Diagnosis of Cancer. Patient is an 84 year old male who presented to [place] several weeks ago with a GI bleed. He had been started on half of a baby aspirin several weeks prior to that. He was admitted and transfused. EGD showed an ulcer and and submucosal mass. He was seen to have a large mass in the epigastric region which was felt to potentially be in the liver. Biopsy was performed which was positive for GI stromal tumor. He has not had any additional gross bleeding.  He has been at a skilled nursing facility since his recent discharge last week. Prior to this, he was living ing<?????<??en getting around a wheelchair due to rubbing his hamstring muscle and requiring surgery for that last fall. He has been working extensively with therapy. He has been able to live alone and perform his activities of daily living independently prior to this. He has had some weight loss and decreased appetite over the last few months. He had some chest pain in January which retrospectively was likely related to anemia. He has had a prior diagnosis of prostate cancer and renal cell cancer.    RADIOLOGY RESULTS: See E-Chart or I-Site for most recent results. Images and reports are reviewed.  Ct Abdomen Pelvis W Contrast  Result Date: 02/17/2018 CLINICAL DATA: 84 year old male with history of submucosal ulcerated gastric mass noted on EGD along the lesser curvature of the stomach. EXAM: CT ABDOMEN AND PELVIS WITH CONTRAST TECHNIQUE: Multidetector CT imaging of the abdomen and pelvis was performed using the standard protocol following bolus administration of intravenous contrast. CONTRAST: 100 mL ISOVUE-300  IOPAMIDOL (ISOVUE-300) INJECTION 61% COMPARISON: None. FINDINGS: Lower chest: Areas of cylindrical bronchiectasis, scarring and volume loss in the lower lobes of the lungs bilaterally. Atherosclerosis in the left main, left anterior descending, left circumflex and right coronary arteries. Calcifications of the aortic valve. Hepatobiliary: Gastric mass (discussed below) comes in close proximity to the undersurface of the left lobe of the liver, without definitive evidence of direct invasion. Liver has a slightly shrunken appearance and nodular contour, which could suggest mild cirrhosis. No discrete cystic or solid hepatic lesions. No intra or extrahepatic biliary ductal dilatation. Status post cholecystectomy. Pancreas: No pancreatic mass. No pancreatic ductal dilatation. No pancreatic or peripancreatic fluid or inflammatory changes. Spleen: Calcified granuloma in the spleen. Adrenals/Urinary Tract: Right kidney is either surgically or congenitally absent, or atrophic. 4.7 x 6.8 x 4.0 cm low-attenuation lesion in the lower pole of the left kidney is compatible with a large simple cyst. No hydroureteronephrosis. Urinary bladder is nearly completely decompressed with an indwelling Foley balloon catheter in place. 2.0 x 1.4 cm left adrenal nodule is incompletely characterized. Right adrenal gland is normal in appearance. Stomach/Bowel: Along the lesser curvature of the distal stomach there is a large heterogeneously enhancing partially calcified gastric mass measuring 9.7 x 8.8 x 10.9 cm (axial image 34 of series 3 and coronal image 39 of series 6). Surgical clip extending into the gastric lumen from the left lateral margin of the mass, suggesting recent endoscopic biopsy. No pathologic dilatation of small bowel or colon. Normal appendix. Vascular/Lymphatic: Aortic atherosclerosis, without evidence of aneurysm or dissection in the abdominal or pelvic vasculature. No lymphadenopathy noted  in the abdomen or pelvis.  Reproductive: Prostate gland and seminal vesicles are unremarkable in appearance. Other: No significant volume of ascites. No pneumoperitoneum. Musculoskeletal: Severe levoscoliosis of the mid lumbar spine. There are no aggressive appearing lytic or blastic lesions noted in the visualized portions of the skeleton. IMPRESSION: 1. 9.7 x 8.8 x 10.9 cm aggressive appearing heterogeneously enhancing mass along the lesser curvature of the stomach, highly concerning for primary gastric neoplasm. This is intimately associated with the undersurface of the left lobe of the liver without definitive evidence of direct invasion (although superficial involvement of the hepatic capsule is suspected given the loss of the intervening fat plane). There is also an indeterminate left adrenal nodule measuring 2.0 x 1.4 cm, which could be metastatic. No other definite signs of metastatic disease elsewhere in the abdomen or pelvis. 2. Areas of cylindrical bronchiectasis and chronic scarring in the lower lobes of the lungs bilaterally. 3. Aortic atherosclerosis, in addition to left main and 3 vessel coronary artery disease. 4. Right kidney is either surgically or congenitally absent, or severely atrophic. 5. Additional incidental findings, as above. Electronically Signed By: Vinnie Langton M.D. On: 02/17/2018 16:03  Ct Biopsy  Result Date: 02/20/2018 pathology Diagnosis Soft Tissue Needle Core Biopsy, gastric - FINDINGS CONSISTENT WITH GASTROINTESTINAL STROMAL TUMOR (GIST).  02/24/2018 CMET with albumin 2.3 CBC HCT 29.6 Prealbumin 12.9   Problem List/Past Medical (April Staton, CMA; 03/03/2018 3:19 PM) Alcohol Abuse  Asthma  Back Pain  Stomach Ulcer  kidney  gallbladder  prostate removal  Chest pain   Past Surgical History (April Staton, CMA; 03/03/2018 3:28 PM) Prostate Surgery - Removal  Gallbladder Surgery - Laparoscopic  open not laparoscopic  Diagnostic Studies History (April Staton, [place];  03/03/2018 3:20 PM) Colonoscopy   Allergies (April Staton, CMA; 03/03/2018 3:11 PM) Peanuts  Coconut Oil *CHEMICALS*   Medication History (April Staton, CMA; 03/03/2018 3:13 PM) Gabapentin (300MG  Capsule, Oral) Active. Melatonin (5MG  Capsule, Oral) Active. Mirtazapine (7.5MG  Tablet, Oral) Active. Movantik (25MG  Tablet, Oral) Active. Mupirocin (2% Ointment, External) Active. OxyMORphone HCl (10MG  Tablet, Oral) Active. Medications Reconciled  Social History (April Staton, CMA; 03/03/2018 3:21 PM) No alcohol use  Caffeine use  Tea. No drug use   Family History (April Staton, [place]; 03/03/2018 3:21 PM) Arthritis  Mother, Son. Other Cancer  Mother.    Review of Systems Stark Klein MD; 03/03/2018 6:03 PM) All other systems negative  Vitals (April Staton CMA; 03/03/2018 3:13 PM) 03/03/2018 3:13 PM Weight: 152.38 lb Height: 69in Body Surface Area: 1.84 m Body Mass Index: 22.5 kg/m  Temp.: 98.57F(Oral)  Pulse: 76 (Regular)  P.OX: 94% (Room air)       Physical Exam Stark Klein MD; 03/03/2018 6:03 PM) The physical exam findings are as follows: Note:in wheelchair, thin.  General Mental Status-Alert. General Appearance-Consistent with stated age. Hydration-Well hydrated. Voice-Normal.  Head and Neck Head-normocephalic, atraumatic with no lesions or palpable masses.  Eye Sclera/Conjunctiva - Bilateral-No scleral icterus.  Chest and Lung Exam Chest and lung exam reveals -quiet, even and easy respiratory effort with no use of accessory muscles. Inspection Chest Wall - Normal. Back - normal.  Breast - Did not examine.  Cardiovascular Cardiovascular examination reveals -normal pedal pulses bilaterally. Note: regular rate and rhythm  Abdomen Inspection-Inspection Normal. Palpation/Percussion Palpation and Percussion of the abdomen reveal - Soft, Non Tender, No Rebound tenderness, No Rigidity (guarding) and No  hepatosplenomegaly.  Peripheral Vascular Upper Extremity Inspection - Bilateral - Normal - No Clubbing, No Cyanosis, No Edema, Pulses Intact. Lower  Extremity Palpation - Edema - Bilateral - No edema.  Neurologic Neurologic evaluation reveals -alert and oriented x 3 with no impairment of recent or remote memory. Mental Status-Normal.  Musculoskeletal Global Assessment -Note: no gross deformities.  Normal Exam - Left-Upper Extremity Strength Normal and Lower Extremity Strength Normal. Normal Exam - Right-Upper Extremity Strength Normal and Lower Extremity Strength Normal.  Lymphatic Head & Neck  General Head & Neck Lymphatics: Bilateral - Description - Normal. Axillary  General Axillary Region: Bilateral - Description - Normal. Tenderness - Non Tender.    Assessment & Plan Stark Klein MD; 03/03/2018 6:07 PM) MALIGNANT GASTROINTESTINAL STROMAL TUMOR (GIST) OF STOMACH (C49.A2) Impression: I suspect that he bled into this tumor contributing to his pain and GI bleeding. However in retrospect I can see it on his scans in January. I would recommend resection as it is likely to bleed again. He is overall very functional and has reasonable health. I would plan to do a laparoscopic-assisted approach for this patient if possible. I discussed that I would need to make an incision large enough to remove the tumor and that might need to open if I needed to resect a large portion of the stomach. I discussed that a safe operation was more important than small incisions.  I reviewed with the patient and his son the risks of surgery including bleeding, infection, damage to adjacent structures, leak from the staple line, possible prolonged nausea or prolonged hospital stay, possible blood clot, possible heart or lung complications, possible prolonged nursing home stay, possible blood transfusion, and others. He would like to proceed as soon as possible.  35 min spent in evaluation,  examination, counseling, and coordination of care. >50% spent in counseling. Current Plans Schedule for Surgery Pt Education - [facility] Free Text Education/Instructions: discussed with patient and provided information.   Signed by Stark Klein, MD (03/03/2018 6:08 PM)

## 2018-03-12 NOTE — Transfer of Care (Signed)
Immediate Anesthesia Transfer of Care Note  Patient: Casey Gates  Procedure(s) Performed: LAPAROSCOPIC ASSISTED PARTIAL GASTRECTOMY ERAS PATHWAY (N/A Abdomen)  Patient Location: PACU  Anesthesia Type:General  Level of Consciousness: drowsy  Airway & Oxygen Therapy: Patient Spontanous Breathing and Patient connected to face mask oxygen  Post-op Assessment: Report given to RN and Post -op Vital signs reviewed and stable  Post vital signs: Reviewed and stable  Last Vitals:  Vitals Value Taken Time  BP 175/72 03/12/2018 11:47 AM  Temp    Pulse 62 03/12/2018 11:48 AM  Resp 13 03/12/2018 11:48 AM  SpO2 99 % 03/12/2018 11:48 AM  Vitals shown include unvalidated device data.  Last Pain:  Vitals:   03/12/18 0706  TempSrc:   PainSc: 0-No pain         Complications: No apparent anesthesia complications

## 2018-03-13 ENCOUNTER — Encounter (HOSPITAL_COMMUNITY): Payer: Self-pay | Admitting: General Surgery

## 2018-03-13 LAB — BASIC METABOLIC PANEL
Anion gap: 11 (ref 5–15)
BUN: 7 mg/dL (ref 6–20)
CALCIUM: 8 mg/dL — AB (ref 8.9–10.3)
CO2: 21 mmol/L — ABNORMAL LOW (ref 22–32)
CREATININE: 0.89 mg/dL (ref 0.61–1.24)
Chloride: 105 mmol/L (ref 101–111)
GFR calc Af Amer: >60 mL/min (ref >60)
GLUCOSE: 96 mg/dL (ref 65–99)
Potassium: 4.3 mmol/L (ref 3.5–5.1)
SODIUM: 137 mmol/L (ref 135–145)

## 2018-03-13 LAB — CBC
HCT: 32.1 % — ABNORMAL LOW (ref 39.0–52.0)
Hemoglobin: 10.1 g/dL — ABNORMAL LOW (ref 13.0–17.0)
MCH: 28.5 pg (ref 26.0–34.0)
MCHC: 31.5 g/dL (ref 30.0–36.0)
MCV: 90.7 fL (ref 78.0–100.0)
PLATELETS: 154 10*3/uL (ref 150–400)
RBC: 3.54 MIL/uL — ABNORMAL LOW (ref 4.22–5.81)
RDW: 14.8 % (ref 11.5–15.5)
WBC: 8.4 10*3/uL (ref 4.0–10.5)

## 2018-03-13 MED ORDER — MORPHINE SULFATE 15 MG PO TABS: 30.0000 mg | ORAL_TABLET | Freq: Four times a day (QID) | ORAL | Status: DC | PRN

## 2018-03-13 NOTE — Progress Notes (Signed)
1 Day Post-Op   Subjective/Chief Complaint: Having some pain and keeps taking off CO2 monitor.  No nausea.  Not hungry.    Objective: Vital signs in last 24 hours: Temp:  [97.3 F (36.3 C)-98.7 F (37.1 C)] 98.7 F (37.1 C) (04/12 1020) Pulse Rate:  [67-78] 74 (04/12 1020) Resp:  [10-22] 17 (04/12 1305) BP: (108-162)/(38-80) 116/63 (04/12 1020) SpO2:  [93 %-100 %] 96 % (04/12 1305) Last BM Date: 03/11/18  Intake/Output from previous day: 04/11 0701 - 04/12 0700 In: 3618.8 [I.V.:3308.8; IV Piggyback:310] Out: 9678 [Urine:1520; Blood:200] Intake/Output this shift: No intake/output data recorded.  General appearance: alert, cooperative and no distress Resp: breathing comfortably Cardio: regular rate and rhythm GI: soft, non distended, approp tender.  some serosang staining on dressing.  Lab Results:  Recent Labs    03/12/18 2157 03/13/18 0805  WBC 7.1 8.4  HGB 8.8* 10.1*  HCT 28.4* 32.1*  PLT 173 154   BMET Recent Labs    03/12/18 1621 03/13/18 0805  NA  --  137  K  --  4.3  CL  --  105  CO2  --  21*  GLUCOSE  --  96  BUN  --  7  CREATININE 0.91 0.89  CALCIUM  --  8.0*   PT/INR No results for input(s): LABPROT, INR in the last 72 hours. ABG No results for input(s): PHART, HCO3 in the last 72 hours.  Invalid input(s): PCO2, PO2  Studies/Results: No results found.  Anti-infectives: Anti-infectives (From admission, onward)   Start     Dose/Rate Route Frequency Ordered Stop   03/12/18 1700  ceFAZolin (ANCEF) IVPB 2g/100 mL premix     2 g 200 mL/hr over 30 Minutes Intravenous Every 8 hours 03/12/18 1605 03/12/18 1907   03/12/18 0644  ceFAZolin (ANCEF) IVPB 2g/100 mL premix     2 g 200 mL/hr over 30 Minutes Intravenous On call to O.R. 03/12/18 9381 03/12/18 0933      Assessment/Plan: s/p Procedure(s): LAPAROSCOPIC ASSISTED PARTIAL GASTRECTOMY ERAS PATHWAY (N/A) d/c foley  Clears today Restart home meds Fulls tomorrow.  LOS: 1 day    Stark Klein 03/13/2018

## 2018-03-14 LAB — BASIC METABOLIC PANEL
ANION GAP: 8 (ref 5–15)
BUN: 5 mg/dL — ABNORMAL LOW (ref 6–20)
CALCIUM: 8 mg/dL — AB (ref 8.9–10.3)
CO2: 25 mmol/L (ref 22–32)
CREATININE: 0.75 mg/dL (ref 0.61–1.24)
Chloride: 103 mmol/L (ref 101–111)
GFR calc non Af Amer: >60 mL/min (ref >60)
Glucose, Bld: 132 mg/dL — ABNORMAL HIGH (ref 65–99)
Potassium: 4 mmol/L (ref 3.5–5.1)
SODIUM: 136 mmol/L (ref 135–145)

## 2018-03-14 LAB — CBC
HCT: 29.6 % — ABNORMAL LOW (ref 39.0–52.0)
HEMOGLOBIN: 9 g/dL — AB (ref 13.0–17.0)
MCH: 27.1 pg (ref 26.0–34.0)
MCHC: 30.4 g/dL (ref 30.0–36.0)
MCV: 89.2 fL (ref 78.0–100.0)
Platelets: 206 10*3/uL (ref 150–400)
RBC: 3.32 MIL/uL — ABNORMAL LOW (ref 4.22–5.81)
RDW: 14.3 % (ref 11.5–15.5)
WBC: 10.5 10*3/uL (ref 4.0–10.5)

## 2018-03-14 MED ORDER — PANTOPRAZOLE SODIUM 40 MG PO PACK
40.0000 mg | PACK | Freq: Every day | ORAL | Status: DC
  Administered 2018-03-14 – 2018-03-19 (×5): 40 mg
  Filled 2018-03-14 (×5): qty 20

## 2018-03-14 NOTE — Progress Notes (Addendum)
2 Days Post-Op   Subjective/Chief Complaint: Having some pain - was not using PCA or aware how to push button.  Not hungry but tolerating liquids without n/v. Denies flatus/bm as of yet.  Objective: Vital signs in last 24 hours: Temp:  [98 F (36.7 C)-98.7 F (37.1 C)] 98 F (36.7 C) (04/13 0522) Pulse Rate:  [74-88] 79 (04/13 0522) Resp:  [12-19] 13 (04/13 0757) BP: (116-158)/(63-79) 124/68 (04/13 0522) SpO2:  [96 %-99 %] 97 % (04/13 0522) Last BM Date: 03/11/18  Intake/Output from previous day: 04/12 0701 - 04/13 0700 In: 2950 [P.O.:300; I.V.:2485; IV Piggyback:165] Out: 2125 [Urine:2125] Intake/Output this shift: Total I/O In: 0  Out: 900 [Urine:900]  General appearance: alert, cooperative and no distress Resp: normal respiratory effort Cardio: regular rate and rhythm GI: abdomen soft, NT/ND; incisions c/d/i  Lab Results:  Recent Labs    03/13/18 0805 03/14/18 0407  WBC 8.4 10.5  HGB 10.1* 9.0*  HCT 32.1* 29.6*  PLT 154 206   BMET Recent Labs    03/13/18 0805 03/14/18 0407  NA 137 136  K 4.3 4.0  CL 105 103  CO2 21* 25  GLUCOSE 96 132*  BUN 7 5*  CREATININE 0.89 0.75  CALCIUM 8.0* 8.0*   PT/INR No results for input(s): LABPROT, INR in the last 72 hours. ABG No results for input(s): PHART, HCO3 in the last 72 hours.  Invalid input(s): PCO2, PO2  Studies/Results: No results found.  Anti-infectives: Anti-infectives (From admission, onward)   Start     Dose/Rate Route Frequency Ordered Stop   03/12/18 1700  ceFAZolin (ANCEF) IVPB 2g/100 mL premix     2 g 200 mL/hr over 30 Minutes Intravenous Every 8 hours 03/12/18 1605 03/12/18 1907   03/12/18 0644  ceFAZolin (ANCEF) IVPB 2g/100 mL premix     2 g 200 mL/hr over 30 Minutes Intravenous On call to O.R. 03/12/18 5366 03/12/18 0933      Assessment/Plan: s/p Procedure(s): LAPAROSCOPIC ASSISTED PARTIAL GASTRECTOMY ERAS PATHWAY (N/A)  Full liquids; monitor. To remain on full liquids for at least  2 wks postop per Dr. Barry Dienes Continue home meds PT/OT - has not ambulated since surgery and is afraid to do so for fear of falling PPx: Lov, SCDs, PPI  LOS: 2 days    Ileana Roup 03/14/2018

## 2018-03-15 ENCOUNTER — Inpatient Hospital Stay (HOSPITAL_COMMUNITY): Payer: Medicare Other

## 2018-03-15 LAB — CBC WITH DIFFERENTIAL/PLATELET
BASOS ABS: 0 10*3/uL (ref 0.0–0.1)
Basophils Relative: 0 %
EOS PCT: 0 %
Eosinophils Absolute: 0 10*3/uL (ref 0.0–0.7)
HCT: 29.4 % — ABNORMAL LOW (ref 39.0–52.0)
Hemoglobin: 9 g/dL — ABNORMAL LOW (ref 13.0–17.0)
LYMPHS PCT: 8 %
Lymphs Abs: 2 10*3/uL (ref 0.7–4.0)
MCH: 27.1 pg (ref 26.0–34.0)
MCHC: 30.6 g/dL (ref 30.0–36.0)
MCV: 88.6 fL (ref 78.0–100.0)
Monocytes Absolute: 1.9 10*3/uL — ABNORMAL HIGH (ref 0.1–1.0)
Monocytes Relative: 8 %
NEUTROS ABS: 21 10*3/uL — AB (ref 1.7–7.7)
Neutrophils Relative %: 84 %
PLATELETS: 240 10*3/uL (ref 150–400)
RBC: 3.32 MIL/uL — AB (ref 4.22–5.81)
RDW: 14.6 % (ref 11.5–15.5)
WBC: 25 10*3/uL — AB (ref 4.0–10.5)

## 2018-03-15 LAB — URINALYSIS, ROUTINE W REFLEX MICROSCOPIC
Bilirubin Urine: NEGATIVE
Glucose, UA: NEGATIVE mg/dL
HGB URINE DIPSTICK: NEGATIVE
Ketones, ur: NEGATIVE mg/dL
Leukocytes, UA: NEGATIVE
NITRITE: NEGATIVE
PROTEIN: NEGATIVE mg/dL
Specific Gravity, Urine: 1.014 (ref 1.005–1.030)
pH: 5 (ref 5.0–8.0)

## 2018-03-15 LAB — CBC
HCT: 29.9 % — ABNORMAL LOW (ref 39.0–52.0)
Hemoglobin: 9.3 g/dL — ABNORMAL LOW (ref 13.0–17.0)
MCH: 27.6 pg (ref 26.0–34.0)
MCHC: 31.1 g/dL (ref 30.0–36.0)
MCV: 88.7 fL (ref 78.0–100.0)
PLATELETS: 238 10*3/uL (ref 150–400)
RBC: 3.37 MIL/uL — AB (ref 4.22–5.81)
RDW: 14.6 % (ref 11.5–15.5)
WBC: 17.5 10*3/uL — ABNORMAL HIGH (ref 4.0–10.5)

## 2018-03-15 LAB — BASIC METABOLIC PANEL
Anion gap: 8 (ref 5–15)
BUN: 6 mg/dL (ref 6–20)
CO2: 25 mmol/L (ref 22–32)
Calcium: 8.4 mg/dL — ABNORMAL LOW (ref 8.9–10.3)
Chloride: 103 mmol/L (ref 101–111)
Creatinine, Ser: 0.85 mg/dL (ref 0.61–1.24)
Glucose, Bld: 128 mg/dL — ABNORMAL HIGH (ref 65–99)
POTASSIUM: 4.1 mmol/L (ref 3.5–5.1)
SODIUM: 136 mmol/L (ref 135–145)

## 2018-03-15 MED ORDER — PIPERACILLIN-TAZOBACTAM 3.375 G IVPB
3.3750 g | Freq: Three times a day (TID) | INTRAVENOUS | Status: DC
  Administered 2018-03-15 – 2018-03-22 (×20): 3.375 g via INTRAVENOUS
  Filled 2018-03-15 (×23): qty 50

## 2018-03-15 MED ORDER — PIPERACILLIN-TAZOBACTAM 3.375 G IVPB 30 MIN
3.3750 g | Freq: Once | INTRAVENOUS | Status: AC
  Administered 2018-03-15: 3.375 g via INTRAVENOUS
  Filled 2018-03-15: qty 50

## 2018-03-15 NOTE — Evaluation (Addendum)
Occupational Therapy Evaluation Patient Details Name: Casey Gates MRN: [id number] DOB: August 28, 1926 Today's Date: 03/15/2018    History of Present Illness Pt is an 84 y.o. male s/p LAPAROSCOPIC ASSISTED PARTIAL GASTRECTOMY ERAS PATHWAY. PMHx: Blind L eye, Prostate cancer, Cellulitis, CKD, Chronic pain syndrome, DDD, GI bleed, Kyphoscoliosis, Gastrointestinal stromal tumor.   Clinical Impression   Pt reports he was mod I with BADL PTA. Currently pt requires min assist for stand pivot transfer, min assist for seated UB ADL, and max assist for LB ADL. Pt appears mildly confused at start of session but oriented to person, place, and situation. Cognition seemed to improve as session progressed, however, pt continued to require cues throughout functional tasks for sequencing and safety. At this time, recommending SNF for follow up to maximize independence and safety with ADL and functional mobility. Pt would benefit from continued skilled OT to address established goals.    Follow Up Recommendations  SNF;Supervision/Assistance - 24 hour    Equipment Recommendations  None recommended by OT    Recommendations for Other Services       Precautions / Restrictions Precautions Precautions: Fall Restrictions Weight Bearing Restrictions: No      Mobility Bed Mobility Overal bed mobility: Needs Assistance Bed Mobility: Supine to Sit     Supine to sit: Min guard;HOB elevated     General bed mobility comments: Increased time and effort, cues for sequencing and safety. HOB elevated with use of bed rails.  Transfers Overall transfer level: Needs assistance Equipment used: Rolling walker (2 wheeled) Transfers: Sit to/from [facility] Sit to Stand: Min assist Stand pivot transfers: Min assist       General transfer comment: Min assist for balance. Increased time and effort. Pt in forward flexed posture throughout transfer; unable to achieve full upright posture.     Balance Overall balance assessment: Needs assistance Sitting-balance support: Feet supported;Bilateral upper extremity supported Sitting balance-Leahy Scale: Good     Standing balance support: Bilateral upper extremity supported Standing balance-Leahy Scale: Poor                             ADL either performed or assessed with clinical judgement   ADL Overall ADL's : Needs assistance/impaired Eating/Feeding: Set up;Sitting   Grooming: Minimal assistance;Sitting   Upper Body Bathing: Minimal assistance;Sitting   Lower Body Bathing: Maximal assistance;Sit to/from stand   Upper Body Dressing : Minimal assistance;Sitting   Lower Body Dressing: Maximal assistance;Sit to/from stand   Toilet Transfer: Minimal assistance;Stand-pivot;RW Armed forces technical officer Details (indicate cue type and reason): Simulated by transfer EOB to chair         Functional mobility during ADLs: Minimal assistance;Rolling walker(for stand pivot only)       Vision         Perception     Praxis      Pertinent Vitals/Pain Pain Assessment: Faces Faces Pain Scale: Hurts even more Pain Location: abdomen Pain Descriptors / Indicators: Aching;Sharp Pain Intervention(s): Limited activity within patient's tolerance;Monitored during session;Repositioned;PCA encouraged     Hand Dominance Right   Extremity/Trunk Assessment Upper Extremity Assessment Upper Extremity Assessment: Generalized weakness   Lower Extremity Assessment Lower Extremity Assessment: Defer to PT evaluation   Cervical / Trunk Assessment Cervical / Trunk Assessment: Kyphotic   Communication Communication Communication: No difficulties   Cognition Arousal/Alertness: Awake/alert Behavior During Therapy: WFL for tasks assessed/performed Overall Cognitive Status: No family/caregiver present to determine baseline cognitive functioning Area of Impairment: Awareness;Problem solving;Safety/judgement  Safety/Judgement: Decreased awareness of safety Awareness: Emergent Problem Solving: Decreased initiation;Difficulty sequencing;Requires verbal cues General Comments: Pt initially reporting he needed to finish "putting the boards up" gesturing to the end of the bed. Pt oriented to person, place, and situation and appeared to become less confused with progression of movement. Required cues throughout tasks for sequencing and safety.   General Comments       Exercises     Shoulder Instructions      Home Living Family/patient expects to be discharged to:: Private residence Living Arrangements: Alone Available Help at Discharge: Family;Available PRN/intermittently Type of Home: Independent living facility Home Access: Level entry     Home Layout: One level     Bathroom Shower/Tub: Occupational psychologist: Handicapped height     Home Equipment: Environmental consultant - 2 wheels;Electric scooter;Shower seat - built in;Grab bars - tub/shower;Hand held shower head   Additional Comments: Pt reports he lives at [place]      Prior Functioning/Environment Level of Independence: Needs assistance  Gait / Transfers Assistance Needed: RW for household distances, uses scooter for mobility to dining room for meals ADL's / Homemaking Assistance Needed: Pt reports mod I with BADL.            OT Problem List: Decreased strength;Decreased range of motion;Decreased activity tolerance;Impaired balance (sitting and/or standing);Decreased cognition;Decreased safety awareness;Decreased knowledge of use of DME or AE;Decreased knowledge of precautions;Pain      OT Treatment/Interventions: Self-care/ADL training;Therapeutic exercise;Energy conservation;DME and/or AE instruction;Therapeutic activities;Patient/family education;Balance training    OT Goals(Current goals can be found in the care plan section) Acute Rehab OT Goals Patient Stated Goal: decrease pain OT Goal Formulation:  With patient Time For Goal Achievement: 03/29/18 Potential to Achieve Goals: Good ADL Goals Pt Will Perform Grooming: with min guard assist;standing Pt Will Perform Upper Body Bathing: with supervision;sitting Pt Will Perform Lower Body Bathing: sit to/from stand;with min guard assist Pt Will Transfer to Toilet: with min guard assist;ambulating;bedside commode Pt Will Perform Toileting - Clothing Manipulation and hygiene: with min guard assist;sit to/from stand  OT Frequency: Min 2X/week   Barriers to D/C: Decreased caregiver support          Co-evaluation              AM-PAC PT "6 Clicks" Daily Activity     Outcome Measure Help from another person eating meals?: A Little Help from another person taking care of personal grooming?: A Little Help from another person toileting, which includes using toliet, bedpan, or urinal?: A Lot Help from another person bathing (including washing, rinsing, drying)?: A Lot Help from another person to put on and taking off regular upper body clothing?: A Little Help from another person to put on and taking off regular lower body clothing?: A Lot 6 Click Score: 15   End of Session Equipment Utilized During Treatment: Rolling walker Nurse Communication: Mobility status  Activity Tolerance: Patient tolerated treatment well Patient left: in chair;with call bell/phone within reach;with chair alarm set  OT Visit Diagnosis: Unsteadiness on feet (R26.81);Other abnormalities of gait and mobility (R26.89);Muscle weakness (generalized) (M62.81);Pain Pain - part of body: (abdomen)                Time: 9326-7124 OT Time Calculation (min): 16 min Charges:  OT General Charges $OT Visit: 1 Visit OT Evaluation $OT Eval Moderate Complexity: 1 Mod G-Codes:     Elorah Dewing A. Ulice Brilliant, M.S., OTR/L Pager: [place] 03/15/2018, 10:54 AM

## 2018-03-15 NOTE — Progress Notes (Signed)
3 Days Post-Op   Subjective/Chief Complaint: Pain better today. Tolerating full liquids without n/v. Denies flatus/bm as of yet. Febrile to 38.3.  Objective: Vital signs in last 24 hours: Temp:  [98.4 F (36.9 C)-100.9 F (38.3 C)] 100 F (37.8 C) (04/14 0654) Pulse Rate:  [74-101] 101 (04/14 0547) Resp:  [14-20] 20 (04/14 0821) BP: (135-163)/(61-79) 139/61 (04/14 0547) SpO2:  [92 %-98 %] 92 % (04/14 0821) Last BM Date: 03/11/18  Intake/Output from previous day: 04/13 0701 - 04/14 0700 In: 2473.7 [P.O.:532; I.V.:1941.7] Out: 1950 [LTJQZ:0092] Intake/Output this shift: No intake/output data recorded.  General appearance: alert, cooperative and no distress Resp: normal respiratory effort Cardio: regular rate and rhythm GI: abdomen soft, NT/ND; incisions c/d/i  Lab Results:  Recent Labs    03/14/18 0407 03/15/18 0653  WBC 10.5 17.5*  HGB 9.0* 9.3*  HCT 29.6* 29.9*  PLT 206 238   BMET Recent Labs    03/14/18 0407 03/15/18 0653  NA 136 136  K 4.0 4.1  CL 103 103  CO2 25 25  GLUCOSE 132* 128*  BUN 5* 6  CREATININE 0.75 0.85  CALCIUM 8.0* 8.4*   PT/INR No results for input(s): LABPROT, INR in the last 72 hours. ABG No results for input(s): PHART, HCO3 in the last 72 hours.  Invalid input(s): PCO2, PO2  Studies/Results: No results found.  Anti-infectives: Anti-infectives (From admission, onward)   Start     Dose/Rate Route Frequency Ordered Stop   03/12/18 1700  ceFAZolin (ANCEF) IVPB 2g/100 mL premix     2 g 200 mL/hr over 30 Minutes Intravenous Every 8 hours 03/12/18 1605 03/12/18 1907   03/12/18 0644  ceFAZolin (ANCEF) IVPB 2g/100 mL premix     2 g 200 mL/hr over 30 Minutes Intravenous On call to O.R. 03/12/18 3300 03/12/18 0933      Assessment/Plan: s/p Procedure(s): LAPAROSCOPIC ASSISTED PARTIAL GASTRECTOMY ERAS PATHWAY (N/A)  Febrile to 38.3 with leukocytosis of 17. Abdominal discomfort improving, abdomen benign on exam. Will obtain CXR and  UA to further evaluate Continue liquids; monitor. To remain on full liquids for at least 2 wks postop per Dr. Barry Dienes Continue home meds PT/OT PPx: Lov, SCDs, PPI  LOS: 3 days   Ileana Roup 03/15/2018

## 2018-03-15 NOTE — Progress Notes (Signed)
Patient more confused this afternoon. Unable to void on his own. Straight cath done with 1000 cc urine returned. UA sent to lab.

## 2018-03-15 NOTE — Progress Notes (Signed)
Pharmacy Antibiotic Note Casey Gates is a 84 y.o. male admitted on 03/12/2018. Now with concern for aspiration pneumonia and to start Zosyn.   Plan: 1. Zosyn 3.375g IV q8h (4 hour infusion).  Weight: 150 lb (68 kg)  Temp (24hrs), Avg:99.9 F (37.7 C), Min:98.4 F (36.9 C), Max:100.9 F (38.3 C)  Recent Labs  Lab 03/10/18 1057 03/12/18 1621 03/12/18 2157 03/13/18 0805 03/14/18 0407 03/15/18 0653 03/15/18 0953  WBC 6.4  --  7.1 8.4 10.5 17.5* 25.0*  CREATININE 1.03 0.91  --  0.89 0.75 0.85  --     Estimated Creatinine Clearance: 56.7 mL/min (by C-G formula based on SCr of 0.85 mg/dL).    Allergies  Allergen Reactions  . Coconut Flavor [Flavoring Agent] Hypertension    Anything that is related to coconut---LOSS OF CONSCIOUSNESS (SYNCOPE)  . Coconut Oil Other (See Comments)    Anything that is related to coconut--- LOSS OF CONSCIOUSNESS (SYNCOPE)  . Food Anaphylaxis    ALLERGIC TO ALL NUTS WITH THE EXCEPTION OF CASHEWS, ALMONDS, AND PEANUTS.   Thank you for allowing pharmacy to be a part of this patient's care.  Vincenza Hews, PharmD, BCPS 03/15/2018, 3:55 PM

## 2018-03-15 NOTE — Evaluation (Signed)
Physical Therapy Evaluation Patient Details Name: Casey Gates MRN: [id number] DOB: 1926/04/18 Today's Date: 03/15/2018   History of Present Illness  Pt is an 84 y.o. male s/p LAPAROSCOPIC ASSISTED PARTIAL GASTRECTOMY ERAS PATHWAY. PMHx: Blind L eye, Prostate cancer, Cellulitis, CKD, Chronic pain syndrome, DDD, GI bleed, Kyphoscoliosis, Gastrointestinal stromal tumor.  Clinical Impression   Patient is s/p above surgery resulting in functional limitations due to the deficits listed below (see PT Problem List). Currently requires assistance for bed mobility and transfers; pain is limiting activity tolerance this evaluation;  Patient will benefit from skilled PT to increase their independence and safety with mobility to allow discharge to the venue listed below.       Follow Up Recommendations SNF    Equipment Recommendations  None recommended by PT    Recommendations for Other Services       Precautions / Restrictions Precautions Precautions: Fall      Mobility  Bed Mobility Overal bed mobility: Needs Assistance Bed Mobility: Supine to Sit     Supine to sit: Min assist     General bed mobility comments: Increased time and effort, cues for sequencing and safety. HOB elevated with use of bed rails.  Transfers Overall transfer level: Needs assistance Equipment used: Rolling walker (2 wheeled) Transfers: Sit to/from Stand Sit to Stand: Min assist         General transfer comment: Min assist for balance. Increased time and effort. Pt in forward flexed posture throughout transfer; unable to achieve full upright posture.  Ambulation/Gait Ambulation/Gait assistance: Min assist Ambulation Distance (Feet): (sidesteps at edge of bed) Assistive device: Rolling walker (2 wheeled) Gait Pattern/deviations: Shuffle     General Gait Details: small, shuffling steps; only agreed to small step up toward the [place]            Wheelchair Mobility    Modified Rankin  (Stroke Patients Only)       Balance     Sitting balance-Leahy Scale: Fair       Standing balance-Leahy Scale: Poor                               Pertinent Vitals/Pain Pain Assessment: Faces Faces Pain Scale: Hurts little more Pain Location: abdomen Pain Descriptors / Indicators: Aching;Sharp Pain Intervention(s): Monitored during session;PCA encouraged;Repositioned    Home Living Family/patient expects to be discharged to:: Private residence Living Arrangements: Alone Available Help at Discharge: Family;Available PRN/intermittently Type of Home: Independent living facility Home Access: Level entry     Home Layout: One level Home Equipment: Walker - 2 wheels;Electric scooter;Shower seat - built in;Grab bars - tub/shower;Hand held shower head Additional Comments: Pt reports he lives at [place]    Prior Function Level of Independence: Needs assistance   Gait / Transfers Assistance Needed: RW for household distances, uses scooter for mobility to dining room for meals  ADL's / Homemaking Assistance Needed: Pt reports mod I with BADL.        Hand Dominance   Dominant Hand: Right    Extremity/Trunk Assessment   Upper Extremity Assessment Upper Extremity Assessment: Defer to OT evaluation    Lower Extremity Assessment Lower Extremity Assessment: Generalized weakness    Cervical / Trunk Assessment Cervical / Trunk Assessment: Kyphotic  Communication   Communication: No difficulties  Cognition Arousal/Alertness: Awake/alert Behavior During Therapy: WFL for tasks assessed/performed Overall Cognitive Status: No family/caregiver present to determine baseline cognitive functioning Area of  Impairment: Awareness;Problem solving;Safety/judgement                         Safety/Judgement: Decreased awareness of safety Awareness: Emergent Problem Solving: Decreased initiation;Difficulty sequencing;Requires verbal cues         General Comments      Exercises     Assessment/Plan    PT Assessment Patient needs continued PT services  PT Problem List Decreased strength;Decreased activity tolerance;Decreased balance;Decreased mobility       PT Treatment Interventions Gait training;Patient/family education;Functional mobility training;Therapeutic activities;Therapeutic exercise    PT Goals (Current goals can be found in the Care Plan section)  Acute Rehab PT Goals Patient Stated Goal: decrease pain PT Goal Formulation: With patient Time For Goal Achievement: 03/29/18 Potential to Achieve Goals: Fair    Frequency Min 3X/week   Barriers to discharge Decreased caregiver support      Co-evaluation               AM-PAC PT "6 Clicks" Daily Activity  Outcome Measure Difficulty turning over in bed (including adjusting bedclothes, sheets and blankets)?: A Lot Difficulty moving from lying on back to sitting on the side of the bed? : A Lot Difficulty sitting down on and standing up from a chair with arms (e.g., wheelchair, bedside commode, etc,.)?: A Lot Help needed moving to and from a bed to chair (including a wheelchair)?: A Little Help needed walking in hospital room?: A Lot Help needed climbing 3-5 steps with a railing? : A Lot 6 Click Score: 13    End of Session Equipment Utilized During Treatment: Gait belt Activity Tolerance: Patient limited by pain Patient left: in bed;with call bell/phone within reach;with bed alarm set Nurse Communication: Mobility status PT Visit Diagnosis: Unsteadiness on feet (R26.81);Other abnormalities of gait and mobility (R26.89);Pain Pain - part of body: (abdomen)    Time: 1610-9604 PT Time Calculation (min) (ACUTE ONLY): 19 min   Charges:   PT Evaluation $PT Eval Moderate Complexity: 1 Mod     PT G Codes:        Roney Marion, PT  Acute Rehabilitation Services Pager [phone] Office [phone]    Colletta [place] 03/15/2018, 5:23 PM

## 2018-03-16 LAB — CBC
HEMATOCRIT: 25.6 % — AB (ref 39.0–52.0)
Hemoglobin: 7.9 g/dL — ABNORMAL LOW (ref 13.0–17.0)
MCH: 27.5 pg (ref 26.0–34.0)
MCHC: 30.9 g/dL (ref 30.0–36.0)
MCV: 89.2 fL (ref 78.0–100.0)
Platelets: 240 10*3/uL (ref 150–400)
RBC: 2.87 MIL/uL — ABNORMAL LOW (ref 4.22–5.81)
RDW: 15 % (ref 11.5–15.5)
WBC: 15.5 10*3/uL — AB (ref 4.0–10.5)

## 2018-03-16 MED ORDER — ADULT MULTIVITAMIN W/MINERALS CH
1.0000 | ORAL_TABLET | Freq: Every day | ORAL | Status: DC
  Administered 2018-03-16 – 2018-03-24 (×8): 1 via ORAL
  Filled 2018-03-16 (×9): qty 1

## 2018-03-16 MED ORDER — MORPHINE SULFATE 15 MG PO TABS
30.0000 mg | ORAL_TABLET | Freq: Four times a day (QID) | ORAL | Status: DC
  Administered 2018-03-16 – 2018-03-19 (×9): 30 mg via ORAL
  Filled 2018-03-16 (×10): qty 2

## 2018-03-16 MED ORDER — PREMIER PROTEIN SHAKE
11.0000 [oz_av] | Freq: Two times a day (BID) | ORAL | Status: DC
  Administered 2018-03-16 – 2018-03-23 (×5): 11 [oz_av] via ORAL
  Filled 2018-03-16 (×20): qty 325.31

## 2018-03-16 NOTE — Social Work (Signed)
Pt arrived from [facility], SNF recommended. Completing FL2 for placement if preferred at discharge for pt.   Alexander Mt, [place] Work [phone]

## 2018-03-16 NOTE — Care Management Important Message (Signed)
Important Message  Patient Details  Name: Casey Gates MRN: [id number] Date of Birth: 1926/04/06   Medicare Important Message Given:  Yes    Kelin Borum [address]ine [address] 03/16/2018, 1:33 PM

## 2018-03-16 NOTE — Progress Notes (Signed)
Initial Nutrition Assessment  DOCUMENTATION CODES:   Not applicable  INTERVENTION:   -Initiate 48 hour calorie count -Premier Protein BID, each supplement provides 160 kcals and 30 grams protein -MVI daily  NUTRITION DIAGNOSIS:   Increased nutrient needs related to post-op healing as evidenced by estimated needs.  GOAL:   Patient will meet greater than or equal to 90% of their needs  MONITOR:   PO intake, Supplement acceptance, Diet advancement, Labs, Weight trends, Skin, I & O's  REASON FOR ASSESSMENT:   Consult Calorie Count  ASSESSMENT:   Patient is an 84 year old male who presented to hospital several weeks ago with a GI bleed.  He had been started on half of a baby aspirin several weeks prior to that.  He was admitted and transfused.  EGD showed an ulcer and and submucosal mass.  He was seen to have a large mass in the epigastric region which was felt to potentially be in the liver.  Biopsy was performed which was positive for GI stromal tumor  4/11- s/p partial gastrectomy   Case discussed with RN, who reports pt has had periods of confusion. Hr was just transferred out of bed and has not eaten breakfast tray yet.   Spoke with pt, who complains on stomach pain after eating applesauce. Pt had been consuming juices without difficulty. He reports he had a good appetite PTA (Breakfast: eggs, toast with jelly, and coffee or Boost supplement, Lunch and Dinner: meat, starch, and vegetable). Pt confirms that he resides from [facility]; he reports he typically eats well, but sometimes does not consume breakfast (but will consume Boost supplement if he does not eat).   Observed breakfast tray; pt consumed 1/2 cup of ice cream, 1/2 cup of applesauce, 1/4 of orange juice, and 75% of cranberry juice. Pt did not eat grits because he does not eat them without eggs- estimate pt consumed approximately 200 kcals and 2 grams protein.   Pt denies weight loss; reports UBW of around  150-152#. Reviewed CareEverywhere; noted wt of 162# on 04/27/13.   Reviewed records from SNF; per MD notes pt with decreased appetite PTA, but stable wt.   Per general surgery notes, plan for pt to remain on full liquid diet for 2 weeks post-op.   Pt reports he has limited mobility, spends most of his time in a motorized wheelchair. RN confirms this is true, however, pt has been participating in walking with mobility tech. Noted mild muscle depletion in lower extremities, likely related to advanced age and decline in mobility.   Discussed with pt importance of good meal and supplement intake to promote healing. Encouraged pt to consume food off trays; provided pt with grape juice and vanilla ice cream per his request.   Medications reviewed and include remeron and melatonin.   Labs reviewed.   NUTRITION - FOCUSED PHYSICAL EXAM:    Most Recent Value  Orbital Region  No depletion  Upper Arm Region  No depletion  Thoracic and Lumbar Region  No depletion  Buccal Region  No depletion  Temple Region  No depletion  Clavicle Bone Region  No depletion  Clavicle and Acromion Bone Region  No depletion  Scapular Bone Region  No depletion  Dorsal Hand  No depletion  Patellar Region  Mild depletion  Anterior Thigh Region  Mild depletion  Posterior Calf Region  Mild depletion  Edema (RD Assessment)  None  Hair  Reviewed  Eyes  Reviewed  Mouth  Reviewed  Skin  Reviewed  Nails  Reviewed       Diet Order:  Diet full liquid Room service appropriate? Yes; Fluid consistency: Thin  EDUCATION NEEDS:   Education needs have been addressed  Skin:  Skin Assessment: Skin Integrity Issues: Skin Integrity Issues:: Incisions Incisions: closed abdominal incision  Last BM:  03/11/18  Height:   Ht Readings from Last 1 Encounters:  03/10/18 5\' 9"  (1.753 m)    Weight:   Wt Readings from Last 1 Encounters:  03/12/18 150 lb (68 kg)    Ideal Body Weight:  72.7 kg  BMI:  Body mass index is  22.15 kg/m.  Estimated Nutritional Needs:   Kcal:  1700-1900  Protein:  85-100 grams  Fluid:  1.7-1.9 L    Marvon Shillingburg A. Jimmye Norman, RD, LDN, CDE Pager: [phone] After hours Pager: [phone]

## 2018-03-16 NOTE — NC FL2 (Signed)
[place] MEDICAID FL2 LEVEL OF CARE SCREENING TOOL     IDENTIFICATION  Patient Name: Ashvin Adelson Birthdate: Dec 04, 1925 Sex: male Admission Date (Current Location): 03/12/2018  [facility] and [place] Number:  Herbalist and Address:  [facility] [place], [place] [address] [place], [place] [address]      Provider Number: [id number]  Attending Physician Name and Address:  Stark Klein, MD  Relative Name and Phone Number:  [name], son, [phone]    Current Level of Care: Hospital Recommended Level of Care: [place] Prior Approval Number:    Date Approved/Denied:   PASRR Number:  [id number]   Discharge Plan: SNF    Current Diagnoses: Patient Active Problem List   Diagnosis Date Noted  . Malignant gastrointestinal stromal tumor (GIST) of stomach ([place]) 03/12/2018  . Urinary retention 02/24/2018  . Gastrointestinal stromal tumor (GIST) of stomach ([place])   . Restless leg syndrome   . Gastric mass 02/17/2018  . Acute GI bleeding 02/16/2018  . Acute blood loss anemia 02/16/2018  . Constipation 01/20/2018  . Exertional shortness of breath 12/30/2017  . Rupture of left quadriceps muscle 07/07/2017  . Rupture of left quadriceps tendon 06/26/2017  . Eczema 06/26/2017  . Chest pain 07/29/2015  . OSA on CPAP 07/10/2015  . CPAP use counseling 07/10/2015  . Excessive daytime sleepiness 02/22/2015  . Sleep apnea 02/22/2015  . Snoring 02/22/2015  . Nocturia more than twice per night 02/22/2015  . Syncope 12/31/2012  . Bifascicular bundle branch block 12/31/2012    Class: Acute  . Cellulitis and abscess of leg, except foot 12/04/2012  . Chronic pain 12/04/2012  . Kyphosis 12/04/2012  . H/O unilateral nephrectomy 12/04/2012  . H/O prostate cancer 12/04/2012  . S/P TURP 12/04/2012    Orientation RESPIRATION BLADDER Height & Weight     Self, Situation, Time, Place  O2, Normal(nasal canula 2 L intermittant) Incontinent, External  catheter Weight: 150 lb (68 kg) Height:     BEHAVIORAL SYMPTOMS/MOOD NEUROLOGICAL BOWEL NUTRITION STATUS      Incontinent    AMBULATORY STATUS COMMUNICATION OF NEEDS Skin   Extensive Assist Verbally Surgical wounds(incision on abdomen with honeycomb dressing)                       Personal Care Assistance Level of Assistance  Bathing, Feeding, Dressing Bathing Assistance: Maximum assistance Feeding assistance: Independent Dressing Assistance: Maximum assistance     Functional Limitations Info  Sight, Hearing, Speech Sight Info: Adequate Hearing Info: Adequate Speech Info: Adequate    SPECIAL CARE FACTORS FREQUENCY  PT (By licensed PT), OT (By licensed OT)     PT Frequency: 5x week OT Frequency: 5x week            Contractures Contractures Info: Not present    Additional Factors Info  Code Status, Allergies Code Status Info: Full Code Allergies Info: COCONUT FLAVOR FLAVORING AGENT, COCONUT OIL, FOOD            Current Medications (03/16/2018):  This is the current hospital active medication list Current Facility-Administered Medications  Medication Dose Route Frequency Provider Last Rate Last Dose  . bisacodyl (DULCOLAX) suppository 10 mg  10 mg Rectal PRN Stark Klein, MD      . cetaphil lotion 1 application  1 application Topical QID PRN Stark Klein, MD   1 application at 67/61/95 0059  . dextrose 5 % and 0.45 % NaCl with KCl 20 mEq/L infusion   Intravenous  Continuous Stark Klein, MD 50 mL/hr at 03/16/18 1110    . diphenhydrAMINE (BENADRYL) 12.5 MG/5ML elixir 12.5 mg  12.5 mg Oral Q6H PRN Stark Klein, MD       Or  . diphenhydrAMINE (BENADRYL) injection 12.5 mg  12.5 mg Intravenous Q6H PRN Stark Klein, MD   12.5 mg at 03/12/18 2025  . diphenhydrAMINE (BENADRYL) injection 12.5 mg  12.5 mg Intravenous Q6H PRN Georganna Skeans, MD       Or  . diphenhydrAMINE (BENADRYL) 12.5 MG/5ML elixir 12.5 mg  12.5 mg Oral Q6H PRN Georganna Skeans, MD      .  enoxaparin (LOVENOX) injection 40 mg  40 mg Subcutaneous Q24H Stark Klein, MD   40 mg at 03/16/18 0814  . gabapentin (NEURONTIN) capsule 300 mg  300 mg Oral TID Stark Klein, MD   300 mg at 03/16/18 1718  . hydrALAZINE (APRESOLINE) injection 10 mg  10 mg Intravenous Q2H PRN Stark Klein, MD      . HYDROmorphone (DILAUDID) 1 mg/mL PCA injection   Intravenous Q4H Georganna Skeans, MD      . lactated ringers infusion   Intravenous Continuous Stark Klein, MD 10 mL/hr at 03/12/18 0708    . Melatonin TABS 3 mg  3 mg Oral QHS Stark Klein, MD   3 mg at 03/15/18 2107  . methocarbamol (ROBAXIN) 500 mg in dextrose 5 % 50 mL IVPB  500 mg Intravenous Q8H Georganna Skeans, MD   Stopped at 03/16/18 1347  . mirtazapine (REMERON) tablet 7.5 mg  7.5 mg Oral QHS Stark Klein, MD   7.5 mg at 03/15/18 2106  . morphine (MSIR) tablet 30 mg  30 mg Oral Q6H Stark Klein, MD   30 mg at 03/16/18 1511  . multivitamin with minerals tablet 1 tablet  1 tablet Oral Daily Stark Klein, MD   1 tablet at 03/16/18 1512  . naloxegol oxalate (MOVANTIK) tablet 25 mg  25 mg Oral Daily Stark Klein, MD   25 mg at 03/16/18 1251  . naloxone [facility]) injection 0.4 mg  0.4 mg Intravenous PRN Georganna Skeans, MD       And  . sodium chloride flush (NS) 0.9 % injection 9 mL  9 mL Intravenous PRN Georganna Skeans, MD      . ondansetron [facility]) injection 4 mg  4 mg Intravenous Q6H PRN Georganna Skeans, MD   4 mg at 03/14/18 0038  . pantoprazole sodium (PROTONIX) 40 mg/20 mL oral suspension 40 mg  40 mg Per Tube QHS Stark Klein, MD   40 mg at 03/15/18 2107  . piperacillin-tazobactam (ZOSYN) IVPB 3.375 g  3.375 g Intravenous Q8H Stark Klein, MD 12.5 mL/hr at 03/16/18 1708 3.375 g at 03/16/18 1708  . polyvinyl alcohol (LIQUIFILM TEARS) 1.4 % ophthalmic solution 1-2 drop  1-2 drop Both Eyes TID PRN Stark Klein, MD      . prochlorperazine (COMPAZINE) tablet 10 mg  10 mg Oral Q6H PRN Stark Klein, MD       Or  . prochlorperazine  (COMPAZINE) injection 5-10 mg  5-10 mg Intravenous Q6H PRN Stark Klein, MD      . protein supplement (PREMIER PROTEIN) liquid  11 oz Oral BID BM Stark Klein, MD   11 oz at 03/16/18 1517  . sodium chloride (OCEAN) 0.65 % nasal spray 1 spray  1 spray Each Nare QID PRN Stark Klein, MD         Discharge Medications: Please see discharge summary for a list of discharge medications.  Relevant Imaging Results:  Relevant Lab Results:   Additional Information SS# [id number]; ports on upper and lower R side  Alexander Mt, LCSWA

## 2018-03-16 NOTE — Progress Notes (Signed)
4 Days Post-Op   Subjective/Chief Complaint: Pain tolerable.  No n/v.  Coughing up "nasty stuff."  Objective: Vital signs in last 24 hours: Temp:  [98.5 F (36.9 C)-100.6 F (38.1 C)] 99.2 F (37.3 C) (04/15 1442) Pulse Rate:  [75-91] 81 (04/15 1442) Resp:  [12-20] 20 (04/15 1442) BP: (99-140)/(53-74) 99/53 (04/15 1442) SpO2:  [0 %-98 %] 95 % (04/15 1442) Last BM Date: 03/11/18  Intake/Output from previous day: 04/14 0701 - 04/15 0700 In: 1570 [P.O.:720; I.V.:800; IV Piggyback:50] Out: 1000 [Urine:1000] Intake/Output this shift: No intake/output data recorded.  General appearance: alert, cooperative and no distress Resp: normal respiratory effort Cardio: regular rate and rhythm GI: abdomen soft, NT/ND; incisions c/d/i  Lab Results:  Recent Labs    03/15/18 0953 03/16/18 0549  WBC 25.0* 15.5*  HGB 9.0* 7.9*  HCT 29.4* 25.6*  PLT 240 240   BMET Recent Labs    03/14/18 0407 03/15/18 0653  NA 136 136  K 4.0 4.1  CL 103 103  CO2 25 25  GLUCOSE 132* 128*  BUN 5* 6  CREATININE 0.75 0.85  CALCIUM 8.0* 8.4*   PT/INR No results for input(s): LABPROT, INR in the last 72 hours. ABG No results for input(s): PHART, HCO3 in the last 72 hours.  Invalid input(s): PCO2, PO2  Studies/Results: Dg Chest Port 1 View  Result Date: 03/15/2018 CLINICAL DATA:  Leukocytosis, shortness of breath EXAM: PORTABLE CHEST 1 VIEW COMPARISON:  Portable exam 0911 hours compared to 12/30/2017 FINDINGS: Enlargement of cardiac silhouette. Tracheal deviation to the RIGHT accentuated by slight rotation. Atelectasis and/or infiltrate at both lung bases greater on RIGHT. Upper lungs clear. No pleural effusion or pneumothorax. Gaseous distention of a bowel loop under the RIGHT diaphragm. Free air under the RIGHT hemidiaphragm, with patient post laparoscopic gastrectomy on 03/12/2018 per chart. Bones demineralized. IMPRESSION: Bibasilar atelectasis versus infiltrate greater on RIGHT. Postoperative  free intraperitoneal air under the RIGHT hemidiaphragm Electronically Signed   By: Lavonia Dana M.D.   On: 03/15/2018 10:03    Anti-infectives: Anti-infectives (From admission, onward)   Start     Dose/Rate Route Frequency Ordered Stop   03/15/18 2300  piperacillin-tazobactam (ZOSYN) IVPB 3.375 g     3.375 g 12.5 mL/hr over 240 Minutes Intravenous Every 8 hours 03/15/18 1553     03/15/18 1600  piperacillin-tazobactam (ZOSYN) IVPB 3.375 g     3.375 g 100 mL/hr over 30 Minutes Intravenous  Once 03/15/18 1553 03/15/18 1841   03/12/18 1700  ceFAZolin (ANCEF) IVPB 2g/100 mL premix     2 g 200 mL/hr over 30 Minutes Intravenous Every 8 hours 03/12/18 1605 03/12/18 1907   03/12/18 0644  ceFAZolin (ANCEF) IVPB 2g/100 mL premix     2 g 200 mL/hr over 30 Minutes Intravenous On call to O.R. 03/12/18 [id number] 03/12/18 0933      Assessment/Plan: s/p Procedure(s): LAPAROSCOPIC ASSISTED PARTIAL GASTRECTOMY ERAS PATHWAY (N/A)  Presumed aspiration pneumonia.   Add back standing narcotics.  D/c pca tomorrow.   PT/OT PPx: Lov, SCDs, PPI  LOS: 4 days   Stark Klein 03/16/2018

## 2018-03-17 LAB — CBC
HEMATOCRIT: 24.7 % — AB (ref 39.0–52.0)
Hemoglobin: 7.7 g/dL — ABNORMAL LOW (ref 13.0–17.0)
MCH: 27.7 pg (ref 26.0–34.0)
MCHC: 31.2 g/dL (ref 30.0–36.0)
MCV: 88.8 fL (ref 78.0–100.0)
Platelets: 272 10*3/uL (ref 150–400)
RBC: 2.78 MIL/uL — ABNORMAL LOW (ref 4.22–5.81)
RDW: 15.2 % (ref 11.5–15.5)
WBC: 12.1 10*3/uL — ABNORMAL HIGH (ref 4.0–10.5)

## 2018-03-17 MED ORDER — SENNOSIDES-DOCUSATE SODIUM 8.6-50 MG PO TABS
1.0000 | ORAL_TABLET | Freq: Two times a day (BID) | ORAL | Status: DC
  Administered 2018-03-17 (×2): 1 via ORAL
  Filled 2018-03-17 (×3): qty 1

## 2018-03-17 MED ORDER — PRO-STAT SUGAR FREE PO LIQD
30.0000 mL | Freq: Two times a day (BID) | ORAL | Status: DC
  Administered 2018-03-17: 30 mL via ORAL
  Filled 2018-03-17: qty 30

## 2018-03-17 MED ORDER — BISACODYL 10 MG RE SUPP
10.0000 mg | Freq: Every day | RECTAL | Status: DC
  Administered 2018-03-17 – 2018-03-18 (×2): 10 mg via RECTAL
  Filled 2018-03-17 (×2): qty 1

## 2018-03-17 NOTE — Progress Notes (Signed)
Calorie Count Note  48 hour calorie count ordered.  Diet: Full Liquid Supplements: Premier Protein BID (each supplement provides 160 kcals and 30 grams protein), MVI daily  Day 1 Breakfast: 200 kcals, 2 grams protein Lunch: 290 kcals, 4 grams protein Dinner: 245 kcals, 8 grams protein Supplements: 1 Premier Protein (160 kcals and 30 grams protein)  Total intake: 895 kcal (53% of minimum estimated needs)  44 grams protein (51% of minimum estimated needs)  Day 2 Breakfast: 166 kcals, 5 grams protein Lunch: meal has not occurred Dinner: meal has not occurrred Supplements: (AM dose of Premier Protein given, but not attempted)  Nutrition Dx: Increased nutrient needs related to post-op healing as evidenced by estimated needs; ongoing  Goal:  Patient will meet greater than or equal to 90% of their needs; progressing  Intervention:   -Continue 48 hour calorie count -Continue Premier Protein BID, each supplement provides 160 kcals and 30 grams protein -Continue MVI daily -30 ml Prostat BID, each supplement provides 100 kcals and 15 grams protein   Casey Gates A. Jimmye Norman, RD, LDN, CDE Pager: [phone] After hours Pager: [phone]

## 2018-03-17 NOTE — Clinical Social Work Note (Signed)
Clinical Social Work Assessment  Patient Details  Name: Casey Gates MRN: [id number] Date of Birth: 1926/01/26  Date of referral:  03/17/18               Reason for consult:  Facility Placement, Discharge Planning                Permission sought to share information with:  Facility Sport and exercise psychologist, Family Supports Permission granted to share information::  No  Name::     Jamie Belger  Agency::  [facility]   Relationship::  son  Contact Information:  [phone]  Housing/Transportation Living arrangements for the past 2 months:  [place], [place] of Information:  Adult Children Patient Interpreter Needed:  None Criminal Activity/Legal Involvement Pertinent to Current Situation/Hospitalization:  No - Comment as needed Significant Relationships:  Adult Children, Community Support Lives with:  Facility Resident Do you feel safe going back to the place where you live?  Yes Need for family participation in patient care:  Yes (Comment)(decision making; support with care)  Care giving concerns:  Pt lived at [facility], and most recently had been at [facility] for SNF. Pt requires assistance higher than what family can provide at home.    Social Worker assessment / plan:  CSW spoke with pt son via telephone as pt is not oriented. Pt son states that his dad had lived at [facility] independent living and then was requiring more assistance and SNF was recommended following previous hospital stay. Pt son states he was happy with placement at [facility] and would like pt to return there at discharge. Pt son aware of SNF process and gave permission for CSW to f/u with [facility].   Employment status:  Retired Nurse, adult PT Recommendations:  [place], [place] / Referral to community resources:  [place]  Patient/Family's Response to care:  Pt son aware SNF  recommended again and is in agreement for pt to return to [facility] pending medical stability.   Patient/Family's Understanding of and Emotional Response to Diagnosis, Current Treatment, and Prognosis: Pt son seems aware of pt care needs, diagnosis, current treatment, and prognosis. Pt son sounded audibly worried at beginning of assessment at thinking that his dad was being discharged. Once calm, pt son expressed good understanding of care needs and expectations for SNF placement.   Emotional Assessment Appearance:  Appears stated age Attitude/Demeanor/Rapport:  Unable to Assess Affect (typically observed):  Unable to Assess Orientation:  Fluctuating Orientation (Suspected and/or reported Sundowners), Oriented to Self, Oriented to Place Alcohol / Substance use:  Not Applicable Psych involvement (Current and /or in the community):  No (Comment)  Discharge Needs  Concerns to be addressed:  Discharge Planning Concerns, Care Coordination Readmission within the last 30 days:  Yes Current discharge risk:  Dependent with Mobility, Cognitively Impaired, Physical Impairment Barriers to Discharge:  Ship broker, Continued Medical Work up   [employer], [place] 03/17/2018, 11:33 AM

## 2018-03-17 NOTE — Progress Notes (Signed)
5 Days Post-Op   Subjective/Chief Complaint: Pt eating better.  Has not had BM.    Objective: Vital signs in last 24 hours: Temp:  [98.6 F (37 C)-100.3 F (37.9 C)] 98.6 F (37 C) (04/16 0606) Pulse Rate:  [75-81] 78 (04/16 0606) Resp:  [14-20] 17 (04/16 0606) BP: (94-119)/(52-62) 100/52 (04/16 0606) SpO2:  [91 %-98 %] 96 % (04/16 0606) Last BM Date: 03/11/18  Intake/Output from previous day: 04/15 0701 - 04/16 0700 In: 475 [P.O.:120; I.V.:250; IV Piggyback:105] Out: 1050 [Urine:1050] Intake/Output this shift: No intake/output data recorded.  General appearance: alert, cooperative and no distress Resp: normal respiratory effort Cardio: regular rate and rhythm GI: abdomen soft, NT/ND; incisions c/d/i  Lab Results:  Recent Labs    03/16/18 0549 03/17/18 0436  WBC 15.5* 12.1*  HGB 7.9* 7.7*  HCT 25.6* 24.7*  PLT 240 272   BMET Recent Labs    03/15/18 0653  NA 136  K 4.1  CL 103  CO2 25  GLUCOSE 128*  BUN 6  CREATININE 0.85  CALCIUM 8.4*   PT/INR No results for input(s): LABPROT, INR in the last 72 hours. ABG No results for input(s): PHART, HCO3 in the last 72 hours.  Invalid input(s): PCO2, PO2  Studies/Results: No results found.  Anti-infectives: Anti-infectives (From admission, onward)   Start     Dose/Rate Route Frequency Ordered Stop   03/15/18 2300  piperacillin-tazobactam (ZOSYN) IVPB 3.375 g     3.375 g 12.5 mL/hr over 240 Minutes Intravenous Every 8 hours 03/15/18 1553     03/15/18 1600  piperacillin-tazobactam (ZOSYN) IVPB 3.375 g     3.375 g 100 mL/hr over 30 Minutes Intravenous  Once 03/15/18 1553 03/15/18 1841   03/12/18 1700  ceFAZolin (ANCEF) IVPB 2g/100 mL premix     2 g 200 mL/hr over 30 Minutes Intravenous Every 8 hours 03/12/18 1605 03/12/18 1907   03/12/18 0644  ceFAZolin (ANCEF) IVPB 2g/100 mL premix     2 g 200 mL/hr over 30 Minutes Intravenous On call to O.R. 03/12/18 [id number] 03/12/18 0933      Assessment/Plan: s/p  Procedure(s): LAPAROSCOPIC ASSISTED PARTIAL GASTRECTOMY ERAS PATHWAY (N/A)  Presumed aspiration pneumonia.   D/c pca  PT/OT PPx: Lov, SCDs, PPI Suppository Path with GIST.  outpt referral to oncology.   LOS: 5 days   Casey Gates 03/17/2018

## 2018-03-17 NOTE — Progress Notes (Signed)
Physical Therapy Treatment Patient Details Name: Casey Gates MRN: [id number] DOB: [birth year] Today's Date: 03/17/2018    History of Present Illness Pt is an 84 y.o. male s/p LAPAROSCOPIC ASSISTED PARTIAL GASTRECTOMY ERAS PATHWAY. PMHx: Blind L eye, Prostate cancer, Cellulitis, CKD, Chronic pain syndrome, DDD, GI bleed, Kyphoscoliosis, Gastrointestinal stromal tumor.    PT Comments    Pt limited by abdominal pain that is worse when moving however, was agreeable to ambulation after having stood to use urinal. Even after urinating in urinal, pt with leakage while ambulating and decreased awareness of this. Min A for sit to stand and min A for 15' ambulation with RW. LE there ex performed in bed. PT will continue to follow.    Follow Up Recommendations  SNF     Equipment Recommendations  None recommended by PT    Recommendations for Other Services       Precautions / Restrictions Precautions Precautions: Fall Restrictions Weight Bearing Restrictions: No    Mobility  Bed Mobility Overal bed mobility: Needs Assistance Bed Mobility: Sit to Supine       Sit to supine: Min assist   General bed mobility comments: min A for LE's into bed but pt able to position self once in. Pain in abdomen when attempting to lift LE's  Transfers Overall transfer level: Needs assistance Equipment used: Rolling walker (2 wheeled) Transfers: Sit to/from Stand Sit to Stand: Min assist         General transfer comment: min A for power up with first stand. Needed seated rest after using urinal before being able to stand again to ambulate  Ambulation/Gait Ambulation/Gait assistance: Min assist Ambulation Distance (Feet): 15 Feet Assistive device: Rolling walker (2 wheeled) Gait Pattern/deviations: Shuffle;Trunk flexed Gait velocity: decreased Gait velocity interpretation: <1.31 ft/sec, indicative of household ambulator General Gait Details: small steps, needed a lot of encouragement to walk  insteady of just get in the bed   Stairs             Wheelchair Mobility    Modified Rankin (Stroke Patients Only)       Balance Overall balance assessment: Needs assistance Sitting-balance support: Feet supported;Bilateral upper extremity supported Sitting balance-Leahy Scale: Fair     Standing balance support: Single extremity supported Standing balance-Leahy Scale: Poor Standing balance comment: able to stand for 4 mins to use urinal with bilateral UE support except last 30 secs when stood with unilateral support,. Very fatigued after this time and needed to sit                            Cognition Arousal/Alertness: Awake/alert Behavior During Therapy: WFL for tasks assessed/performed Overall Cognitive Status: No family/caregiver present to determine baseline cognitive functioning Area of Impairment: Safety/judgement                         Safety/Judgement: Decreased awareness of safety     General Comments: pt alert today, meds have been decreased. Did seem unaware that he was urinating as he was ambulating but expect he wears Depends on a regular basis.       Exercises Total Joint Exercises Bridges: AROM;5 reps;Supine General Exercises - Lower Extremity Ankle Circles/Pumps: AROM;Both;10 reps;Supine Quad Sets: AROM;Both;10 reps;Supine Gluteal Sets: AROM;Both;10 reps;Supine Hip ABduction/ADduction: AROM;Both;10 reps;Supine Straight Leg Raises: AROM;Both;10 reps;Supine    General Comments General comments (skin integrity, edema, etc.): Pt very motivated to mobilize when here in March, pain causing  him to need more encouragement to mobilize this time      Pertinent Vitals/Pain Pain Assessment: Faces Faces Pain Scale: Hurts even more Pain Location: abdomen Pain Descriptors / Indicators: Aching;Sharp Pain Intervention(s): Limited activity within patient's tolerance;Monitored during session;Repositioned    Home Living                       Prior Function            PT Goals (current goals can now be found in the care plan section) Acute Rehab PT Goals Patient Stated Goal: decrease pain PT Goal Formulation: With patient Time For Goal Achievement: 03/29/18 Potential to Achieve Goals: Fair Progress towards PT goals: Progressing toward goals    Frequency    Min 3X/week      PT Plan Current plan remains appropriate    Co-evaluation              AM-PAC PT "6 Clicks" Daily Activity  Outcome Measure  Difficulty turning over in bed (including adjusting bedclothes, sheets and blankets)?: A Little Difficulty moving from lying on back to sitting on the side of the bed? : A Lot Difficulty sitting down on and standing up from a chair with arms (e.g., wheelchair, bedside commode, etc,.)?: Unable Help needed moving to and from a bed to chair (including a wheelchair)?: A Little Help needed walking in hospital room?: A Lot Help needed climbing 3-5 steps with a railing? : A Lot 6 Click Score: 13    End of Session Equipment Utilized During Treatment: Gait belt Activity Tolerance: Patient limited by pain Patient left: in bed;with call bell/phone within reach Nurse Communication: Mobility status PT Visit Diagnosis: Unsteadiness on feet (R26.81);Other abnormalities of gait and mobility (R26.89);Pain Pain - part of body: (abdomen)     Time: 5427-0623 PT Time Calculation (min) (ACUTE ONLY): 26 min  Charges:  $Gait Training: 8-22 mins $Therapeutic Exercise: 8-22 mins                    G Codes:       Leighton Roach, PT  Acute Rehab Services  [place] 03/17/2018, 2:01 PM

## 2018-03-18 ENCOUNTER — Inpatient Hospital Stay (HOSPITAL_COMMUNITY): Payer: Medicare Other

## 2018-03-18 DIAGNOSIS — A419 Sepsis, unspecified organism: Secondary | ICD-10-CM | POA: Diagnosis present

## 2018-03-18 DIAGNOSIS — R6521 Severe sepsis with septic shock: Secondary | ICD-10-CM | POA: Diagnosis present

## 2018-03-18 DIAGNOSIS — J189 Pneumonia, unspecified organism: Secondary | ICD-10-CM

## 2018-03-18 LAB — LACTIC ACID, PLASMA
Lactic Acid, Venous: 0.7 mmol/L (ref 0.5–1.9)
Lactic Acid, Venous: 0.8 mmol/L (ref 0.5–1.9)

## 2018-03-18 LAB — CBC
HEMATOCRIT: 27.7 % — AB (ref 39.0–52.0)
HEMOGLOBIN: 8.4 g/dL — AB (ref 13.0–17.0)
MCH: 26.8 pg (ref 26.0–34.0)
MCHC: 30.3 g/dL (ref 30.0–36.0)
MCV: 88.2 fL (ref 78.0–100.0)
Platelets: 330 10*3/uL (ref 150–400)
RBC: 3.14 MIL/uL — AB (ref 4.22–5.81)
RDW: 15.6 % — AB (ref 11.5–15.5)
WBC: 16.4 10*3/uL — AB (ref 4.0–10.5)

## 2018-03-18 LAB — URINALYSIS, ROUTINE W REFLEX MICROSCOPIC
BILIRUBIN URINE: NEGATIVE
Glucose, UA: NEGATIVE mg/dL
HGB URINE DIPSTICK: NEGATIVE
Ketones, ur: NEGATIVE mg/dL
Leukocytes, UA: NEGATIVE
NITRITE: NEGATIVE
PH: 5 (ref 5.0–8.0)
Protein, ur: NEGATIVE mg/dL
Specific Gravity, Urine: 1.033 — ABNORMAL HIGH (ref 1.005–1.030)

## 2018-03-18 LAB — BASIC METABOLIC PANEL
ANION GAP: 8 (ref 5–15)
BUN: 9 mg/dL (ref 6–20)
CALCIUM: 8 mg/dL — AB (ref 8.9–10.3)
CO2: 24 mmol/L (ref 22–32)
Chloride: 103 mmol/L (ref 101–111)
Creatinine, Ser: 1.04 mg/dL (ref 0.61–1.24)
GFR calc Af Amer: >60 mL/min (ref >60)
GFR calc non Af Amer: >60 mL/min (ref >60)
GLUCOSE: 101 mg/dL — AB (ref 65–99)
POTASSIUM: 4.2 mmol/L (ref 3.5–5.1)
Sodium: 135 mmol/L (ref 135–145)

## 2018-03-18 LAB — PROTIME-INR
INR: 1.43
PROTHROMBIN TIME: 17.3 s — AB (ref 11.4–15.2)

## 2018-03-18 LAB — PROCALCITONIN: Procalcitonin: 0.38 ng/mL

## 2018-03-18 LAB — APTT: aPTT: 31 seconds (ref 24–36)

## 2018-03-18 LAB — SURGICAL PCR SCREEN
MRSA, PCR: NEGATIVE
Staphylococcus aureus: NEGATIVE

## 2018-03-18 MED ORDER — IOPAMIDOL (ISOVUE-300) INJECTION 61%
INTRAVENOUS | Status: AC
  Filled 2018-03-18: qty 100

## 2018-03-18 MED ORDER — IOPAMIDOL (ISOVUE-300) INJECTION 61%
100.0000 mL | Freq: Once | INTRAVENOUS | Status: AC
  Administered 2018-03-18: 100 mL via INTRAVENOUS

## 2018-03-18 MED ORDER — NALOXONE HCL 0.4 MG/ML IJ SOLN
INTRAMUSCULAR | Status: AC
  Filled 2018-03-18: qty 1

## 2018-03-18 MED ORDER — IOPAMIDOL (ISOVUE-300) INJECTION 61%
30.0000 mL | Freq: Once | INTRAVENOUS | Status: DC | PRN
Start: 2018-03-18 — End: 2018-03-20

## 2018-03-18 MED ORDER — SODIUM CHLORIDE 0.9 % IV SOLN
2.0000 g | Freq: Once | INTRAVENOUS | Status: DC
  Administered 2018-03-18: 2 g via INTRAVENOUS
  Filled 2018-03-18: qty 2

## 2018-03-18 MED ORDER — MUPIROCIN 2 % EX OINT: 1.0000 "application " | TOPICAL_OINTMENT | Freq: Two times a day (BID) | CUTANEOUS | Status: DC

## 2018-03-18 MED ORDER — VANCOMYCIN HCL 10 G IV SOLR
1250.0000 mg | Freq: Once | INTRAVENOUS | Status: AC
  Administered 2018-03-18: 1250 mg via INTRAVENOUS
  Filled 2018-03-18: qty 1250

## 2018-03-18 MED ORDER — SODIUM CHLORIDE 0.9 % IV BOLUS
1000.0000 mL | Freq: Once | INTRAVENOUS | Status: AC
  Administered 2018-03-18: 1000 mL via INTRAVENOUS

## 2018-03-18 MED ORDER — IOPAMIDOL (ISOVUE-300) INJECTION 61%
INTRAVENOUS | Status: AC
  Filled 2018-03-18: qty 30

## 2018-03-18 MED ORDER — LACTATED RINGERS IV BOLUS (SEPSIS): 1000.0000 mL | Freq: Once | INTRAVENOUS | Status: DC

## 2018-03-18 MED ORDER — ACETAMINOPHEN 650 MG RE SUPP
650.0000 mg | RECTAL | Status: DC | PRN
  Administered 2018-03-18 (×2): 650 mg via RECTAL
  Filled 2018-03-18 (×2): qty 1

## 2018-03-18 MED ORDER — VANCOMYCIN HCL IN DEXTROSE 1-5 GM/200ML-% IV SOLN
1000.0000 mg | INTRAVENOUS | Status: DC
  Filled 2018-03-18: qty 200

## 2018-03-18 MED ORDER — GLUCERNA SHAKE PO LIQD
237.0000 mL | Freq: Two times a day (BID) | ORAL | Status: DC
  Administered 2018-03-19: 237 mL via ORAL
  Filled 2018-03-18: qty 237

## 2018-03-18 MED ORDER — CEFEPIME HCL 1 G IJ SOLR: 1.0000 g | INTRAMUSCULAR | Status: DC

## 2018-03-18 NOTE — Progress Notes (Signed)
Pt transferred to [place] [facility], son at bedside.

## 2018-03-18 NOTE — Progress Notes (Signed)
OT Cancellation Note  Patient Details Name: Casey Gates MRN: [id number] DOB: 1926/10/20   Cancelled Treatment:    Reason Eval/Treat Not Completed: Other (comment). Note medical decline and discontinuation order. If pt becomes appropriate for OT, please reorder. Thanks.  [place], OT/L  [phone] 03/18/2018 03/18/2018, 3:48 PM

## 2018-03-18 NOTE — Progress Notes (Signed)
Calorie Count Note  48 hour calorie count ordered.   Diet: Full Liquid Supplements: Premier Protein BID (each supplement provides 160 kcals and 30 grams protein), MVI daily, 30 ml Prostat BID (each supplement provides 100 kcals and 15 grams protein)  Day 1 Breakfast: 200 kcals, 2 grams protein Lunch: 290 kcals, 4 grams protein Dinner: 245 kcals, 8 grams protein Supplements: 1 Premier Protein (160 kcals and 30 grams protein)  Total intake: 895 kcal (53% of minimum estimated needs)  44 grams protein (51% of minimum estimated needs)  Day 2 Breakfast: 166 kcals, 5 grams protein Lunch: 0% intake Dinner: 0% intake Supplements: (AM dose of Premier Protein given, but not attempted); refuded Premier Protein and Prostat supplements  Total intake: 166 kcal (10% of minimum estimated needs)  5 grams protein (6% of minimum estimated needs)  AverageTotal intake: 531 kcal (31% of minimum estimated needs)  25 grams protein (29% of minimum estimated needs)  Nutrition Dx:  Increased nutrient needsrelated to post-op healingas evidenced by estimated needs; ongoing  Goal: Patient will meet greater than or equal to 90% of their needs; unmet  Intervention:   -D/c calorie count -D/c 30 ml Prostat BID, due to poor acceptance -Glucerna Shake po BID, each supplement provides 220 kcal and 10 grams of protein -Continue Premier Protein BID, each supplement provides 160 kcals and 30 grams protein -Continue MVI daily   Danaria Larsen A. Jimmye Norman, RD, LDN, CDE Pager: [phone] After hours Pager: [phone]

## 2018-03-18 NOTE — Significant Event (Signed)
Rapid Response Event Note  Overview: Time Called: 0824 Arrival Time: 0829 Event Type: Neurologic, Hypotension  Initial Focused Assessment: Patient with decreased LOC this am.  He will open his eyes to voice and stimulation.  Occasionally coughs on command.  Not answering any questions. Lung sounds with rhonchi and upper airway sounds.   Skin is hot to the touch.  Oral temp 99, patient unable to hold lips closed.  Rectal temp 103.6 BP 89/71  HR 93  RR 16  O2 sats 94% on 2-3 L [place] No increased work of breathing.  Interventions: 1L NS bolus Tylenol PR 2 new PIVs started  (infiltrated IV D/c'd by RN prior to my arrival) Labs done  [facility]) at bedside to assess patient.  Bp 107/63  HR 89  RR 16  O2 sat 93% on 3L [place], no inc work of breathing  PCXR done CT chest abd pelvis done  (IV contrast), Due to patient's mental status patient unable to drink contrast (MD notified, orders modified)  BP 1 hour post ns bolus  100/62  Temp 1016 Patient a little easier to arouse, but falls rapidly asleep again  Plan of Care (if not transferred): Awaiting MD assessment possible need for SD level care Increase frequency of VS RN to call if needed or patient worsens.  Event Summary: Name of Physician Notified: Obie Dredge  PA/  Barry Dienes at [phone]    at          [facility]

## 2018-03-18 NOTE — Progress Notes (Signed)
6 Days Post-Op   Subjective/Chief Complaint: Pt having issues this AM.  RN contacted gen surg team due to borderline hypotension and decreased mental status.  He denies nausea.  He has had stools.  Also dropped his sats.    Objective: Vital signs in last 24 hours: Temp:  [99 F (37.2 C)-103.6 F (39.8 C)] 101.6 F (38.7 C) (04/17 1029) Pulse Rate:  [33-100] 89 (04/17 1029) Resp:  [14-18] 16 (04/17 1029) BP: (89-141)/(52-71) 100/62 (04/17 1029) SpO2:  [91 %-100 %] 93 % (04/17 1029) Last BM Date: 03/12/18  Intake/Output from previous day: 04/16 0701 - 04/17 0700 In: 910 [P.O.:700; IV Piggyback:210] Out: 1375 [Urine:1375] Intake/Output this shift: Total I/O In: -  Out: 550 [Urine:550]  General appearance: decreased mental status, but I can get him to awaken and follow commands. Neuro:  No facial droop, good strength on both sides. Able to verbalize appropriately + rigors.   Resp: normal respiratory effort Cardio: regular rate and rhythm GI: abdomen soft, NT/ND; incisions c/d/i  Lab Results:  Recent Labs    03/17/18 0436 03/18/18 0854  WBC 12.1* 16.4*  HGB 7.7* 8.4*  HCT 24.7* 27.7*  PLT 272 330   BMET Recent Labs    03/18/18 0854  NA 135  K 4.2  CL 103  CO2 24  GLUCOSE 101*  BUN 9  CREATININE 1.04  CALCIUM 8.0*   PT/INR No results for input(s): LABPROT, INR in the last 72 hours. ABG No results for input(s): PHART, HCO3 in the last 72 hours.  Invalid input(s): PCO2, PO2  Studies/Results: Ct Chest W Contrast  Result Date: 03/18/2018 CLINICAL DATA:  Recent partial gastrectomy. Fever of unknown origin. EXAM: CT CHEST, ABDOMEN, AND PELVIS WITH CONTRAST TECHNIQUE: Multidetector CT imaging of the chest, abdomen and pelvis was performed following the standard protocol during bolus administration of intravenous contrast. CONTRAST:  187mL ISOVUE-300 IOPAMIDOL (ISOVUE-300) INJECTION 61% COMPARISON:  Chest CT 12/30/2017.  Abdominal CT 02/17/2018. FINDINGS: CT CHEST  FINDINGS Cardiovascular: Heart is mildly enlarged. Diffuse coronary artery and aortic atherosclerosis. No evidence of aortic aneurysm. Mediastinum/Nodes: No mediastinal, hilar, or axillary adenopathy. Lungs/Pleura: Moderate right pleural effusion and small left pleural effusion. Airspace disease with air bronchograms in both lower lobes could reflect compressive atelectasis although pneumonia is possible. Nodular airspace disease noted in the right upper lobe and right middle lobe concerning for pneumonia. Musculoskeletal: Diffuse degenerative disc changes. No acute bony abnormality. CT ABDOMEN PELVIS FINDINGS Hepatobiliary: Prior cholecystectomy.  No focal hepatic abnormality. Pancreas: No focal abnormality or ductal dilatation. Spleen: No focal abnormality.  Normal size. Adrenals/Urinary Tract: Small left adrenal nodule is stable. No right adrenal mass. Prior right nephrectomy. Cysts in the mid and lower pole of the left kidney. No hydronephrosis. Urinary bladder grossly unremarkable. Stomach/Bowel: Postoperative changes in the distal stomach from partial gastrectomy. No evidence of small bowel obstruction. Small amount of free air in the abdomen, likely postoperative. Vascular/Lymphatic: Diffuse aortic and iliac calcifications. Tortuous aorta. No evidence of adenopathy or aneurysm. Reproductive: No visible focal abnormality. Surgical clips in the lower pelvis compatible with prior prostatectomy. Other: No free fluid or free air. Surgical clips along the mid line from recent abdominal surgery. Musculoskeletal: Severe scoliosis and degenerative changes. No acute findings. IMPRESSION: Moderate right pleural effusion and small left pleural effusion. Compressive atelectasis versus pneumonia in the lower lobes. Nodular airspace disease throughout much of the right upper lobe and right middle lobe concerning for pneumonia. Cardiomegaly. Severe/diffuse coronary artery disease. Aortic atherosclerosis. No evidence of  aortic aneurysm. Postoperative changes from recent partial gastrectomy. Small amount of free air in the abdomen, likely related to post op state. No evidence of bowel obstruction. Prior remote right nephrectomy and prostatectomy. Electronically Signed   By: Rolm Baptise M.D.   On: 03/18/2018 10:35   Ct Abdomen Pelvis W Contrast  Result Date: 03/18/2018 CLINICAL DATA:  Recent partial gastrectomy. Fever of unknown origin. EXAM: CT CHEST, ABDOMEN, AND PELVIS WITH CONTRAST TECHNIQUE: Multidetector CT imaging of the chest, abdomen and pelvis was performed following the standard protocol during bolus administration of intravenous contrast. CONTRAST:  110mL ISOVUE-300 IOPAMIDOL (ISOVUE-300) INJECTION 61% COMPARISON:  Chest CT 12/30/2017.  Abdominal CT 02/17/2018. FINDINGS: CT CHEST FINDINGS Cardiovascular: Heart is mildly enlarged. Diffuse coronary artery and aortic atherosclerosis. No evidence of aortic aneurysm. Mediastinum/Nodes: No mediastinal, hilar, or axillary adenopathy. Lungs/Pleura: Moderate right pleural effusion and small left pleural effusion. Airspace disease with air bronchograms in both lower lobes could reflect compressive atelectasis although pneumonia is possible. Nodular airspace disease noted in the right upper lobe and right middle lobe concerning for pneumonia. Musculoskeletal: Diffuse degenerative disc changes. No acute bony abnormality. CT ABDOMEN PELVIS FINDINGS Hepatobiliary: Prior cholecystectomy.  No focal hepatic abnormality. Pancreas: No focal abnormality or ductal dilatation. Spleen: No focal abnormality.  Normal size. Adrenals/Urinary Tract: Small left adrenal nodule is stable. No right adrenal mass. Prior right nephrectomy. Cysts in the mid and lower pole of the left kidney. No hydronephrosis. Urinary bladder grossly unremarkable. Stomach/Bowel: Postoperative changes in the distal stomach from partial gastrectomy. No evidence of small bowel obstruction. Small amount of free air in the  abdomen, likely postoperative. Vascular/Lymphatic: Diffuse aortic and iliac calcifications. Tortuous aorta. No evidence of adenopathy or aneurysm. Reproductive: No visible focal abnormality. Surgical clips in the lower pelvis compatible with prior prostatectomy. Other: No free fluid or free air. Surgical clips along the mid line from recent abdominal surgery. Musculoskeletal: Severe scoliosis and degenerative changes. No acute findings. IMPRESSION: Moderate right pleural effusion and small left pleural effusion. Compressive atelectasis versus pneumonia in the lower lobes. Nodular airspace disease throughout much of the right upper lobe and right middle lobe concerning for pneumonia. Cardiomegaly. Severe/diffuse coronary artery disease. Aortic atherosclerosis. No evidence of aortic aneurysm. Postoperative changes from recent partial gastrectomy. Small amount of free air in the abdomen, likely related to post op state. No evidence of bowel obstruction. Prior remote right nephrectomy and prostatectomy. Electronically Signed   By: Rolm Baptise M.D.   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 Abnormal pulmonary auscultation. EXAM: PORTABLE CHEST 1 VIEW COMPARISON:  03/15/2018 and earlier. FINDINGS: Portable AP semi upright view at at 0852 hours. Progressed and now confluent and more extensive opacity in the right mid and lower lung. Skin fold artifact suspected. No pneumothorax. Small right pleural effusion is possible. Lower lung volumes than baseline with underlying chronic elevation of the right hemidiaphragm. Stable cardiac size and mediastinal contours. Calcified aortic atherosclerosis. Left lung appears clear allowing for portable technique. The postoperative intraperitoneal air is no longer evident. IMPRESSION: 1. Worsening opacity in the right mid and lower lung  suggestive of Multilobar Pneumonia. Possible small pleural effusion. 2. The left lung remains clear. Electronically Signed   By: Genevie Ann M.D.   On: 03/18/2018 09:22    Anti-infectives: Anti-infectives (From admission, onward)   Start     Dose/Rate Route  Frequency Ordered Stop   03/19/18 1400  vancomycin (VANCOCIN) IVPB 1000 mg/200 mL premix     1,000 mg 200 mL/hr over 60 Minutes Intravenous Every 24 hours 03/18/18 1228     03/19/18 1300  ceFEPIme (MAXIPIME) 1 g in sodium chloride 0.9 % 100 mL IVPB  Status:  Discontinued     1 g 200 mL/hr over 30 Minutes Intravenous Every 24 hours 03/18/18 1228 03/18/18 1311   03/18/18 1230  vancomycin (VANCOCIN) 1,250 mg in sodium chloride 0.9 % 250 mL IVPB     1,250 mg 166.7 mL/hr over 90 Minutes Intravenous  Once 03/18/18 1228     03/18/18 1230  ceFEPIme (MAXIPIME) 2 g in sodium chloride 0.9 % 100 mL IVPB  Status:  Discontinued     2 g 200 mL/hr over 30 Minutes Intravenous  Once 03/18/18 1228 03/18/18 1311   03/15/18 2300  piperacillin-tazobactam (ZOSYN) IVPB 3.375 g     3.375 g 12.5 mL/hr over 240 Minutes Intravenous Every 8 hours 03/15/18 1553     03/15/18 1600  piperacillin-tazobactam (ZOSYN) IVPB 3.375 g     3.375 g 100 mL/hr over 30 Minutes Intravenous  Once 03/15/18 1553 03/15/18 1841   03/12/18 1700  ceFAZolin (ANCEF) IVPB 2g/100 mL premix     2 g 200 mL/hr over 30 Minutes Intravenous Every 8 hours 03/12/18 1605 03/12/18 1907   03/12/18 0644  ceFAZolin (ANCEF) IVPB 2g/100 mL premix     2 g 200 mL/hr over 30 Minutes Intravenous On call to O.R. 03/12/18 1962 03/12/18 0933      Assessment/Plan: s/p Procedure(s): LAPAROSCOPIC ASSISTED PARTIAL GASTRECTOMY ERAS PATHWAY (N/A)  Presumed aspiration pneumonia.   CT C/A P showed multifocal pneumonia and no evidence of issues in the abdomen.    PT/OT PPx: Lov, SCDs, PPI Suppository Path with GIST.  outpt referral to oncology. [facility] consult for assistance with pneumonia.   Will need d/c to  SNF once medically stable.     LOS: 6 days   Stark Klein 03/18/2018

## 2018-03-18 NOTE — Progress Notes (Addendum)
Pharmacy Antibiotic Note  Casey Gates is a 84 y.o. male admitted on 03/12/2018 with black tarry stools.  Patient had a recent GIB with EGD showing mass and underwent gastrectomy on 02/16/18.  Pharmacy has been consulted for Zosyn dosing for presume aspiration PNA.  Febrile today with leukocytosis.  Cultures obtained.  Patient's renal function is relatively stable.   Plan: Zosyn EID 3.375gm IV Q8H Monitor renal fxn, micro data/clinical progress and the need to broaden coverage   Weight: 150 lb (68 kg)  Temp (24hrs), Avg:100.4 F (38 C), Min:99 F (37.2 C), Max:103.6 F (39.8 C)  Recent Labs  Lab 03/12/18 1621  03/13/18 0805 03/14/18 0407 03/15/18 0653 03/15/18 0953 03/16/18 0549 03/17/18 0436 03/18/18 0854  WBC  --    < > 8.4 10.5 17.5* 25.0* 15.5* 12.1* 16.4*  CREATININE 0.91  --  0.89 0.75 0.85  --   --   --  1.04  LATICACIDVEN  --   --   --   --   --   --   --   --  0.7   < > = values in this interval not displayed.    Estimated Creatinine Clearance: 46.3 mL/min (by C-G formula based on SCr of 1.04 mg/dL).    Allergies  Allergen Reactions  . Coconut Flavor [Flavoring Agent] Hypertension    Anything that is related to coconut---LOSS OF CONSCIOUSNESS (SYNCOPE)  . Coconut Oil Other (See Comments)    Anything that is related to coconut--- LOSS OF CONSCIOUSNESS (SYNCOPE)  . Food Anaphylaxis    ALLERGIC TO ALL NUTS WITH THE EXCEPTION OF CASHEWS, ALMONDS, AND PEANUTS.     Zosyn 4/14 >>  4/17 BCx -  4/17 UCx -   Devontaye Ground D. Mina Marble, PharmD, BCPS Pager:  [phone] 03/18/2018, 10:33 AM   ==================================   Addendum: Broaden coverage to vanc and cefepime   Plan: Vanc 1250mg  IV x 1, then 1gm IV Q24H Cefepime 2gm IV x 1, then 1g IV Q24H Monitor renal fxn, clinical progress, vanc trough as indicated   Loretta Kluender D. Mina Marble, PharmD, BCPS, [place] Pager:  [phone] 03/18/2018, 12:27 PM

## 2018-03-18 NOTE — Progress Notes (Signed)
Pt presented with change in mental status and fever 103.6; only responing to stimuli, RR called. [place]er[place] notified; Benjamine Mola PA came in to examine pt. See flow sheet

## 2018-03-18 NOTE — Progress Notes (Signed)
PT Cancellation Note  Patient Details Name: Rendell Thivierge MRN: [id number] DOB: 11-Mar-1926   Cancelled Treatment:    Reason Eval/Treat Not Completed: Patient at procedure or test/unavailable;Medical issues which prohibited therapy.  Per discussion with RN, pt with change in status this AM.  Currently off floor for CT.  Will f/u tomorrow as appropriate.    Michel Santee 03/18/2018, 11:06 AM

## 2018-03-18 NOTE — Consult Note (Signed)
Medical Consultation   Casey Gates  [id number]:[id number]  DOB: 1926/03/19  DOA: 03/12/2018  PCP: Velna Hatchet, MD   Requesting physician: Stark Klein  Reason for consultation: H/o GI bleed - massive stromal tumor s/p removal.  Thought to have aspiration PNA over the weekend, started on Zosyn.  Now obtunded, decreased BPs. Scan negative for GI issues.  He is getting worse despite being on therapy for PNA - she needs help with management of this issue.     History of Present Illness: Casey Gates is an 84 y.o. male with h/o OSA not on CPAP; RLS; CKD; chronic pain; prostate cancer; and recent diagnosis of GIST of stomach s/p tumor removal now with apparent sepsis.  He developed an apparent aspiration PNA with fever over the weekend.  He has been receiving Zosyn.  This AM, he was much worse with hypotension and AMS.  He is currently able to answer a few questions but generally obtunded.  He has a condom cath on and has made essentially no urine today; bladder scan shows 151mL in his bladder.     Review of Systems:  ROS Unable to effectively perform  PMH, PSH, SH, and FH reviewed in Epic.   Past Medical History: Past Medical History:  Diagnosis Date  . Anemia   . Asthma    AS A CHILD  . Blind left eye   . Cancer [facility])    PROSTATE CANCER  . Cellulitis    frequently, "all over"  . Chronic kidney disease   . Chronic pain syndrome   . Constipation   . DDD (degenerative disc disease), lumbar   . Gastrointestinal stromal tumor (GIST) of stomach ([place])   . GI bleeding   . History of blood transfusion    01/2018  . Insomnia   . Kyphoscoliosis   . Mass in the abdomen 01/2018  . Restless leg   . Sleep apnea    does not wear CPAP  . Wears glasses     Past Surgical History: Past Surgical History:  Procedure Laterality Date  . CHOLECYSTECTOMY    . COLONOSCOPY    . ESOPHAGOGASTRODUODENOSCOPY (EGD) WITH PROPOFOL Left 02/16/2018   Procedure:  ESOPHAGOGASTRODUODENOSCOPY (EGD) WITH PROPOFOL;  Surgeon: [name], MD;  Location: [place];  Service: Gastroenterology;  Laterality: Left;  . LAPAROSCOPIC GASTRECTOMY N/A 03/12/2018   Procedure: LAPAROSCOPIC ASSISTED PARTIAL GASTRECTOMY ERAS PATHWAY;  Surgeon: Stark Klein, MD;  Location: [place];  Service: General;  Laterality: N/A;  . LAPAROSCOPIC PARTIAL GASTRECTOMY  03/12/2018   LAPAROSCOPIC ASSISTED PARTIAL GASTRECTOMY ERAS PATHWAY  . PROSTATECTOMY    . QUADRICEPS TENDON REPAIR Left 07/07/2017   Procedure: REPAIR QUADRICEP TENDON;  Surgeon: Rod Can, MD;  Location: [place];  Service: Orthopedics;  Laterality: Left;  . RIGHT CATARACT EXTRACTION  2013  . RIGTH NEPHRECTOMY  2006  . TONSILLECTOMY    . TRANSURETHRAL RESECTION OF PROSTATE  1997     Allergies:   Allergies  Allergen Reactions  . Coconut Flavor [Flavoring Agent] Hypertension    Anything that is related to coconut---LOSS OF CONSCIOUSNESS ([place])  . Coconut Oil Other (See Comments)    Anything that is related to coconut--- LOSS OF CONSCIOUSNESS ([place])  . Food Anaphylaxis    ALLERGIC TO ALL NUTS WITH THE EXCEPTION OF CASHEWS, ALMONDS, AND PEANUTS.     Social History:  reports that he has never smoked. He has never used smokeless tobacco. He  reports that he does not drink alcohol or use drugs.   Family History: Family History  Problem Relation Age of Onset  . Bone cancer Mother   . Hypertension Mother   . Arthritis Mother       Physical Exam: Vitals:   03/18/18 0915 03/18/18 0916 03/18/18 0937 03/18/18 1029  BP:  (!) 104/59 (!) 105/52 100/62  Pulse:  87 (!) 33 89  Resp:  14  16  Temp:    (!) 101.6 F (38.7 C)  TempSrc:    Rectal  SpO2: 97% 98% 100% 93%  Weight:        Constitutional: Obtunded, opens eyes to loud voice/touch briefly and answers limited questions  Eyes: Left eye with cloudy cornea from blindness; does not focus well with right eye currently ENMT: external ears and nose  appear normal, hard of hearing, Lips appear normal Neck: neck appears normal, no masses, normal ROM, no thyromegaly, no JVD  CVS: S1-S2 clear, no murmur rubs or gallops, no LE edema, normal pedal pulses  Respiratory:  clear to auscultation bilaterally, no wheezing, rales or rhonchi. Respiratory effort normal. No accessory muscle use.  Abdomen: large midline incision, closed with staples, +BS Musculoskeletal: : no cyanosis, clubbing or edema noted bilaterally Neuro: unable to perform Psych: obtunded Skin: no rashes or lesions or ulcers on limited exam    Data reviewed:  I have personally reviewed the recent labs and imaging studies  Pertinent Labs:   Glucose 101 WBC 16.4 Hgb 8.4 Lactate 0.7, 0.8   Inpatient Medications:   Scheduled Meds: . bisacodyl  10 mg Rectal Daily  . enoxaparin (LOVENOX) injection  40 mg Subcutaneous Q24H  . feeding supplement (GLUCERNA SHAKE)  237 mL Oral BID BM  . gabapentin  300 mg Oral TID  . iopamidol      . iopamidol      . Melatonin  3 mg Oral QHS  . mirtazapine  7.5 mg Oral QHS  . morphine  30 mg Oral Q6H  . multivitamin with minerals  1 tablet Oral Daily  . naloxegol oxalate  25 mg Oral Daily  . naloxone      . pantoprazole sodium  40 mg Per Tube QHS  . protein supplement shake  11 oz Oral BID BM  . senna-docusate  1 tablet Oral BID   Continuous Infusions: . dextrose 5 % and 0.45 % NaCl with KCl 20 mEq/L 50 mL/hr at 03/18/18 1229  . lactated ringers 10 mL/hr at 03/12/18 0708  . methocarbamol (ROBAXIN)  IV Stopped (03/18/18 0544)  . piperacillin-tazobactam (ZOSYN)  IV Stopped (03/18/18 0830)  . sodium chloride 1,000 mL (03/18/18 1325)  . vancomycin    . [START ON 03/19/2018] vancomycin       Radiological Exams on Admission: Ct Chest W Contrast  Result Date: 03/18/2018 CLINICAL DATA:  Recent partial gastrectomy. Fever of unknown origin. EXAM: CT CHEST, ABDOMEN, AND PELVIS WITH CONTRAST TECHNIQUE: Multidetector CT imaging of the chest,  abdomen and pelvis was performed following the standard protocol during bolus administration of intravenous contrast. CONTRAST:  113mL ISOVUE-300 IOPAMIDOL (ISOVUE-300) INJECTION 61% COMPARISON:  Chest CT 12/30/2017.  Abdominal CT 02/17/2018. FINDINGS: CT CHEST FINDINGS Cardiovascular: Heart is mildly enlarged. Diffuse coronary artery and aortic atherosclerosis. No evidence of aortic aneurysm. Mediastinum/Nodes: No mediastinal, hilar, or axillary adenopathy. Lungs/Pleura: Moderate right pleural effusion and small left pleural effusion. Airspace disease with air bronchograms in both lower lobes could reflect compressive atelectasis although pneumonia is possible. Nodular airspace disease noted  in the right upper lobe and right middle lobe concerning for pneumonia. Musculoskeletal: Diffuse degenerative disc changes. No acute bony abnormality. CT ABDOMEN PELVIS FINDINGS Hepatobiliary: Prior cholecystectomy.  No focal hepatic abnormality. Pancreas: No focal abnormality or ductal dilatation. Spleen: No focal abnormality.  Normal size. Adrenals/Urinary Tract: Small left adrenal nodule is stable. No right adrenal mass. Prior right nephrectomy. Cysts in the mid and lower pole of the left kidney. No hydronephrosis. Urinary bladder grossly unremarkable. Stomach/Bowel: Postoperative changes in the distal stomach from partial gastrectomy. No evidence of small bowel obstruction. Small amount of free air in the abdomen, likely postoperative. Vascular/Lymphatic: Diffuse aortic and iliac calcifications. Tortuous aorta. No evidence of adenopathy or aneurysm. Reproductive: No visible focal abnormality. Surgical clips in the lower pelvis compatible with prior prostatectomy. Other: No free fluid or free air. Surgical clips along the mid line from recent abdominal surgery. Musculoskeletal: Severe scoliosis and degenerative changes. No acute findings. IMPRESSION: Moderate right pleural effusion and small left pleural effusion.  Compressive atelectasis versus pneumonia in the lower lobes. Nodular airspace disease throughout much of the right upper lobe and right middle lobe concerning for pneumonia. Cardiomegaly. Severe/diffuse coronary artery disease. Aortic atherosclerosis. No evidence of aortic aneurysm. Postoperative changes from recent partial gastrectomy. Small amount of free air in the abdomen, likely related to post op state. No evidence of bowel obstruction. Prior remote right nephrectomy and prostatectomy. Electronically Signed   By: Rolm Baptise M.D.   On: 03/18/2018 10:35   Ct Abdomen Pelvis W Contrast  Result Date: 03/18/2018 CLINICAL DATA:  Recent partial gastrectomy. Fever of unknown origin. EXAM: CT CHEST, ABDOMEN, AND PELVIS WITH CONTRAST TECHNIQUE: Multidetector CT imaging of the chest, abdomen and pelvis was performed following the standard protocol during bolus administration of intravenous contrast. CONTRAST:  132mL ISOVUE-300 IOPAMIDOL (ISOVUE-300) INJECTION 61% COMPARISON:  Chest CT 12/30/2017.  Abdominal CT 02/17/2018. FINDINGS: CT CHEST FINDINGS Cardiovascular: Heart is mildly enlarged. Diffuse coronary artery and aortic atherosclerosis. No evidence of aortic aneurysm. Mediastinum/Nodes: No mediastinal, hilar, or axillary adenopathy. Lungs/Pleura: Moderate right pleural effusion and small left pleural effusion. Airspace disease with air bronchograms in both lower lobes could reflect compressive atelectasis although pneumonia is possible. Nodular airspace disease noted in the right upper lobe and right middle lobe concerning for pneumonia. Musculoskeletal: Diffuse degenerative disc changes. No acute bony abnormality. CT ABDOMEN PELVIS FINDINGS Hepatobiliary: Prior cholecystectomy.  No focal hepatic abnormality. Pancreas: No focal abnormality or ductal dilatation. Spleen: No focal abnormality.  Normal size. Adrenals/Urinary Tract: Small left adrenal nodule is stable. No right adrenal mass. Prior right nephrectomy.  Cysts in the mid and lower pole of the left kidney. No hydronephrosis. Urinary bladder grossly unremarkable. Stomach/Bowel: Postoperative changes in the distal stomach from partial gastrectomy. No evidence of small bowel obstruction. Small amount of free air in the abdomen, likely postoperative. Vascular/Lymphatic: Diffuse aortic and iliac calcifications. Tortuous aorta. No evidence of adenopathy or aneurysm. Reproductive: No visible focal abnormality. Surgical clips in the lower pelvis compatible with prior prostatectomy. Other: No free fluid or free air. Surgical clips along the mid line from recent abdominal surgery. Musculoskeletal: Severe scoliosis and degenerative changes. No acute findings. IMPRESSION: Moderate right pleural effusion and small left pleural effusion. Compressive atelectasis versus pneumonia in the lower lobes. Nodular airspace disease throughout much of the right upper lobe and right middle lobe concerning for pneumonia. Cardiomegaly. Severe/diffuse coronary artery disease. Aortic atherosclerosis. No evidence of aortic aneurysm. Postoperative changes from recent partial gastrectomy. Small amount of free air in the  abdomen, likely related to post op state. No evidence of bowel obstruction. Prior remote right nephrectomy and prostatectomy. Electronically Signed   By: Rolm Baptise M.D.   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 Abnormal pulmonary auscultation. EXAM: PORTABLE CHEST 1 VIEW COMPARISON:  03/15/2018 and earlier. FINDINGS: Portable AP semi upright view at at 0852 hours. Progressed and now confluent and more extensive opacity in the right mid and lower lung. Skin fold artifact suspected. No pneumothorax. Small right pleural effusion is possible. Lower lung volumes than baseline with underlying chronic elevation of the right hemidiaphragm.  Stable cardiac size and mediastinal contours. Calcified aortic atherosclerosis. Left lung appears clear allowing for portable technique. The postoperative intraperitoneal air is no longer evident. IMPRESSION: 1. Worsening opacity in the right mid and lower lung suggestive of Multilobar Pneumonia. Possible small pleural effusion. 2. The left lung remains clear. Electronically Signed   By: Genevie Ann M.D.   On: 03/18/2018 09:22    Impression/Recommendations Principal Problem:   Multifocal pneumonia Active Problems:   Malignant gastrointestinal stromal tumor (GIST) of stomach ([facility])   Sepsis ([place])  HCAP and sepsis secondary to HCAP -Patient with appropriate recovery from gastric tumor surgery with development of apparent aspiration PNA over the weekend -Today with abrupt deterioration -SIRS criteria in this patient includes: Leukocytosis, fever -Patient has evidence of acute organ failure with altered mental status and borderline hypotension; he also has oliguria -While awaiting blood cultures, this appears to be a preseptic condition. -Sepsis protocol initiated -Patient has now received the 30 cc/kg IVF bolus. -Blood and urine cultures pending -Will transfer to SDU with telemetry and continue to monitor -Will order lower respiratory tract procalcitonin level.  Antibiotics would not be indicated for PCT <0.1 and probably should not be used for < 0.25.  >0.5 indicates infection and >>0.5 indicates more serious disease.  As the procalcitonin level normalizes, it will be reasonable to consider de-escalation of antibiotic coverage. -Patient's fever plus x-ray findings are consistent with multifocal HCAP. - IV Vancomycin and Zosyn for ongoing coverage of aspiration as well as HCAP -Now with oliguria; has h/o urinary retention although none currently (147 cc). -Will change his condom cath to a foley due to h/o retention and current inability to stand to void and to allow for closer I/O  monitoring.  GIST -Appears to be doing well from this standpoint. -Needs outpatient oncology f/u once he is recovered from other illness.  Thank you for this consultation.  Our [facility] hospitalist team will follow the patient with you.   Time Spent: 65 minutes  Total critical care time: 65 minutes Critical care time was exclusive of separately billable procedures and treating other patients. Critical care was necessary to treat or prevent imminent or life-threatening deterioration. Critical care was time spent personally by me on the following activities: development of treatment plan with patient and/or surrogate as well as nursing, discussions with consultants, evaluation of patient's response to treatment, examination of patient, obtaining history from patient or surrogate, ordering and performing treatments and interventions, ordering and review of laboratory studies, ordering and review of radiographic studies, pulse oximetry and re-evaluation of patient's condition.   Karmen Bongo M.D. [facility] 03/18/2018, 1:36 PM

## 2018-03-19 DIAGNOSIS — G8929 Other chronic pain: Secondary | ICD-10-CM

## 2018-03-19 DIAGNOSIS — C49A2 Gastrointestinal stromal tumor of stomach: Principal | ICD-10-CM

## 2018-03-19 DIAGNOSIS — J189 Pneumonia, unspecified organism: Secondary | ICD-10-CM

## 2018-03-19 DIAGNOSIS — A419 Sepsis, unspecified organism: Secondary | ICD-10-CM

## 2018-03-19 LAB — CREATININE, SERUM
Creatinine, Ser: 1.08 mg/dL (ref 0.61–1.24)
GFR calc Af Amer: >60 mL/min (ref >60)
GFR, EST NON AFRICAN AMERICAN: 59 mL/min — AB (ref >60)

## 2018-03-19 LAB — URINE CULTURE: Culture: NO GROWTH

## 2018-03-19 NOTE — Progress Notes (Signed)
7 Days Post-Op   Subjective/Chief Complaint: Pt looks much better today.  Transferred to stepdown yesterday.    Objective: Vital signs in last 24 hours: Temp:  [98.8 F (37.1 C)-99.5 F (37.5 C)] 98.8 F (37.1 C) (04/18 0801) Pulse Rate:  [76-83] 76 (04/18 0801) Resp:  [14-21] 17 (04/18 0801) BP: (99-106)/(52-57) 106/57 (04/18 0801) SpO2:  [93 %-98 %] 96 % (04/18 0801) Weight:  [73.6 kg (162 lb 4.1 oz)] 73.6 kg (162 lb 4.1 oz) (04/18 0527) Last BM Date: 03/19/18  Intake/Output from previous day: 04/17 0701 - 04/18 0700 In: 1065 [I.V.:555; IV Piggyback:510] Out: 1225 [Urine:1225] Intake/Output this shift: Total I/O In: 95 [I.V.:95] Out: 150 [Urine:150]  General appearance: alert and oriented Neuro:  Back to baseline. Resp: normal respiratory effort, decreased secretions. Cardio: regular rate and rhythm GI: abdomen soft, NT/ND; incisions c/d/i  Lab Results:  Recent Labs    03/17/18 0436 03/18/18 0854  WBC 12.1* 16.4*  HGB 7.7* 8.4*  HCT 24.7* 27.7*  PLT 272 330   BMET Recent Labs    03/18/18 0854 03/19/18 0451  NA 135  --   K 4.2  --   CL 103  --   CO2 24  --   GLUCOSE 101*  --   BUN 9  --   CREATININE 1.04 1.08  CALCIUM 8.0*  --    PT/INR Recent Labs    03/18/18 1430  LABPROT 17.3*  INR 1.43   ABG No results for input(s): PHART, HCO3 in the last 72 hours.  Invalid input(s): PCO2, PO2  Studies/Results: Ct Chest W Contrast  Result Date: 03/18/2018 CLINICAL DATA:  Recent partial gastrectomy. Fever of unknown origin. EXAM: CT CHEST, ABDOMEN, AND PELVIS WITH CONTRAST TECHNIQUE: Multidetector CT imaging of the chest, abdomen and pelvis was performed following the standard protocol during bolus administration of intravenous contrast. CONTRAST:  143mL ISOVUE-300 IOPAMIDOL (ISOVUE-300) INJECTION 61% COMPARISON:  Chest CT 12/30/2017.  Abdominal CT 02/17/2018. FINDINGS: CT CHEST FINDINGS Cardiovascular: Heart is mildly enlarged. Diffuse coronary artery and  aortic atherosclerosis. No evidence of aortic aneurysm. Mediastinum/Nodes: No mediastinal, hilar, or axillary adenopathy. Lungs/Pleura: Moderate right pleural effusion and small left pleural effusion. Airspace disease with air bronchograms in both lower lobes could reflect compressive atelectasis although pneumonia is possible. Nodular airspace disease noted in the right upper lobe and right middle lobe concerning for pneumonia. Musculoskeletal: Diffuse degenerative disc changes. No acute bony abnormality. CT ABDOMEN PELVIS FINDINGS Hepatobiliary: Prior cholecystectomy.  No focal hepatic abnormality. Pancreas: No focal abnormality or ductal dilatation. Spleen: No focal abnormality.  Normal size. Adrenals/Urinary Tract: Small left adrenal nodule is stable. No right adrenal mass. Prior right nephrectomy. Cysts in the mid and lower pole of the left kidney. No hydronephrosis. Urinary bladder grossly unremarkable. Stomach/Bowel: Postoperative changes in the distal stomach from partial gastrectomy. No evidence of small bowel obstruction. Small amount of free air in the abdomen, likely postoperative. Vascular/Lymphatic: Diffuse aortic and iliac calcifications. Tortuous aorta. No evidence of adenopathy or aneurysm. Reproductive: No visible focal abnormality. Surgical clips in the lower pelvis compatible with prior prostatectomy. Other: No free fluid or free air. Surgical clips along the mid line from recent abdominal surgery. Musculoskeletal: Severe scoliosis and degenerative changes. No acute findings. IMPRESSION: Moderate right pleural effusion and small left pleural effusion. Compressive atelectasis versus pneumonia in the lower lobes. Nodular airspace disease throughout much of the right upper lobe and right middle lobe concerning for pneumonia. Cardiomegaly. Severe/diffuse coronary artery disease. Aortic atherosclerosis. No evidence of aortic  aneurysm. Postoperative changes from recent partial gastrectomy. Small amount  of free air in the abdomen, likely related to post op state. No evidence of bowel obstruction. Prior remote right nephrectomy and prostatectomy. Electronically Signed   By: Rolm Baptise M.D.   On: 03/18/2018 10:35   Ct Abdomen Pelvis W Contrast  Result Date: 03/18/2018 CLINICAL DATA:  Recent partial gastrectomy. Fever of unknown origin. EXAM: CT CHEST, ABDOMEN, AND PELVIS WITH CONTRAST TECHNIQUE: Multidetector CT imaging of the chest, abdomen and pelvis was performed following the standard protocol during bolus administration of intravenous contrast. CONTRAST:  157mL ISOVUE-300 IOPAMIDOL (ISOVUE-300) INJECTION 61% COMPARISON:  Chest CT 12/30/2017.  Abdominal CT 02/17/2018. FINDINGS: CT CHEST FINDINGS Cardiovascular: Heart is mildly enlarged. Diffuse coronary artery and aortic atherosclerosis. No evidence of aortic aneurysm. Mediastinum/Nodes: No mediastinal, hilar, or axillary adenopathy. Lungs/Pleura: Moderate right pleural effusion and small left pleural effusion. Airspace disease with air bronchograms in both lower lobes could reflect compressive atelectasis although pneumonia is possible. Nodular airspace disease noted in the right upper lobe and right middle lobe concerning for pneumonia. Musculoskeletal: Diffuse degenerative disc changes. No acute bony abnormality. CT ABDOMEN PELVIS FINDINGS Hepatobiliary: Prior cholecystectomy.  No focal hepatic abnormality. Pancreas: No focal abnormality or ductal dilatation. Spleen: No focal abnormality.  Normal size. Adrenals/Urinary Tract: Small left adrenal nodule is stable. No right adrenal mass. Prior right nephrectomy. Cysts in the mid and lower pole of the left kidney. No hydronephrosis. Urinary bladder grossly unremarkable. Stomach/Bowel: Postoperative changes in the distal stomach from partial gastrectomy. No evidence of small bowel obstruction. Small amount of free air in the abdomen, likely postoperative. Vascular/Lymphatic: Diffuse aortic and iliac  calcifications. Tortuous aorta. No evidence of adenopathy or aneurysm. Reproductive: No visible focal abnormality. Surgical clips in the lower pelvis compatible with prior prostatectomy. Other: No free fluid or free air. Surgical clips along the mid line from recent abdominal surgery. Musculoskeletal: Severe scoliosis and degenerative changes. No acute findings. IMPRESSION: Moderate right pleural effusion and small left pleural effusion. Compressive atelectasis versus pneumonia in the lower lobes. Nodular airspace disease throughout much of the right upper lobe and right middle lobe concerning for pneumonia. Cardiomegaly. Severe/diffuse coronary artery disease. Aortic atherosclerosis. No evidence of aortic aneurysm. Postoperative changes from recent partial gastrectomy. Small amount of free air in the abdomen, likely related to post op state. No evidence of bowel obstruction. Prior remote right nephrectomy and prostatectomy. Electronically Signed   By: Rolm Baptise M.D.   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 Abnormal pulmonary auscultation. EXAM: PORTABLE CHEST 1 VIEW COMPARISON:  03/15/2018 and earlier. FINDINGS: Portable AP semi upright view at at 0852 hours. Progressed and now confluent and more extensive opacity in the right mid and lower lung. Skin fold artifact suspected. No pneumothorax. Small right pleural effusion is possible. Lower lung volumes than baseline with underlying chronic elevation of the right hemidiaphragm. Stable cardiac size and mediastinal contours. Calcified aortic atherosclerosis. Left lung appears clear allowing for portable technique. The postoperative intraperitoneal air is no longer evident. IMPRESSION: 1. Worsening opacity in the right mid and lower lung suggestive of Multilobar Pneumonia. Possible small pleural effusion. 2. The left  lung remains clear. Electronically Signed   By: Genevie Ann M.D.   On: 03/18/2018 09:22    Anti-infectives: Anti-infectives (From admission, onward)   Start     Dose/Rate Route  Frequency Ordered Stop   03/19/18 1400  vancomycin (VANCOCIN) IVPB 1000 mg/200 mL premix  Status:  Discontinued     1,000 mg 200 mL/hr over 60 Minutes Intravenous Every 24 hours 03/18/18 1228 03/19/18 0820   03/19/18 1300  ceFEPIme (MAXIPIME) 1 g in sodium chloride 0.9 % 100 mL IVPB  Status:  Discontinued     1 g 200 mL/hr over 30 Minutes Intravenous Every 24 hours 03/18/18 1228 03/18/18 1311   03/18/18 1230  vancomycin (VANCOCIN) 1,250 mg in sodium chloride 0.9 % 250 mL IVPB     1,250 mg 166.7 mL/hr over 90 Minutes Intravenous  Once 03/18/18 1228 03/18/18 1600   03/18/18 1230  ceFEPIme (MAXIPIME) 2 g in sodium chloride 0.9 % 100 mL IVPB  Status:  Discontinued     2 g 200 mL/hr over 30 Minutes Intravenous  Once 03/18/18 1228 03/18/18 1340   03/15/18 2300  piperacillin-tazobactam (ZOSYN) IVPB 3.375 g     3.375 g 12.5 mL/hr over 240 Minutes Intravenous Every 8 hours 03/15/18 1553     03/15/18 1600  piperacillin-tazobactam (ZOSYN) IVPB 3.375 g     3.375 g 100 mL/hr over 30 Minutes Intravenous  Once 03/15/18 1553 03/15/18 1841   03/12/18 1700  ceFAZolin (ANCEF) IVPB 2g/100 mL premix     2 g 200 mL/hr over 30 Minutes Intravenous Every 8 hours 03/12/18 1605 03/12/18 1907   03/12/18 0644  ceFAZolin (ANCEF) IVPB 2g/100 mL premix     2 g 200 mL/hr over 30 Minutes Intravenous On call to O.R. 03/12/18 [id number] 03/12/18 0933      Assessment/Plan: s/p Procedure(s): LAPAROSCOPIC ASSISTED PARTIAL GASTRECTOMY ERAS PATHWAY (N/A)  Presumed aspiration pneumonia.   CT C/A P showed multifocal pneumonia and no evidence of issues in the abdomen.    PT/OT PPx: Lov, SCDs, PPI  Path with GIST.  outpt referral to oncology. [facility] consult for assistance with pneumonia.   Soft diet.  Will need d/c to SNF once medically stable.      LOS: 7 days   Stark Klein 03/19/2018

## 2018-03-19 NOTE — Progress Notes (Signed)
CONSULT NOTE    Casey Gates   CBJ:[id number]  DOB: 1926-03-07  DOA: 03/12/2018 PCP: Velna Hatchet, MD   Brief Narrative:  Casey Gates is an 84 y/o with chronic pain, h/o prostate CA, OSA not on CPAP, nephrectomy, RLS and h/o GI bleeds with recent removal of GIST tumor (partial gastrectomy on 4/11). [facility] were called for management of sepsis due to aspiration pneumonia.    Subjective: Alert today. Has cough. No dyspnea.  ROS: no complaints of nausea, vomiting, constipation diarrhea, cough, dyspnea or dysuria. No other complaints.   Assessment & Plan:   Principal Problem:   Multifocal pneumonia/   Sepsis  - fever 103 on 4/17, WBC up to 16.4 from 12.1 the previous day - CT chest showing nodular infiltrated in right lung suggestive of infection and bibasilar atelectasis vs pneumonia, apparently he was quite lethargic as well - fevers have resolved, he is alert today and communicative - pulse ox 96% on 2 L - d/c Vanc and cont Zosyn for today- wean O2 as able  Active Problems:   Malignant gastrointestinal stromal tumor (GIST) of stomach  - per surgery- he has been on full liquids  Chronic lower pain with chronic constipation - on Neurontin and Opana PRN at home - cont Neurontain- he is on MSIR in the hospital - also on Robaxin Q8 hrs  H/o prostate CA    Antimicrobials:  Anti-infectives (From admission, onward)   Start     Dose/Rate Route Frequency Ordered Stop   03/19/18 1400  vancomycin (VANCOCIN) IVPB 1000 mg/200 mL premix  Status:  Discontinued     1,000 mg 200 mL/hr over 60 Minutes Intravenous Every 24 hours 03/18/18 1228 03/19/18 0820   03/19/18 1300  ceFEPIme (MAXIPIME) 1 g in sodium chloride 0.9 % 100 mL IVPB  Status:  Discontinued     1 g 200 mL/hr over 30 Minutes Intravenous Every 24 hours 03/18/18 1228 03/18/18 1311   03/18/18 1230  vancomycin (VANCOCIN) 1,250 mg in sodium chloride 0.9 % 250 mL IVPB     1,250 mg 166.7 mL/hr over 90 Minutes  Intravenous  Once 03/18/18 1228 03/18/18 1600   03/18/18 1230  ceFEPIme (MAXIPIME) 2 g in sodium chloride 0.9 % 100 mL IVPB  Status:  Discontinued     2 g 200 mL/hr over 30 Minutes Intravenous  Once 03/18/18 1228 03/18/18 1340   03/15/18 2300  piperacillin-tazobactam (ZOSYN) IVPB 3.375 g     3.375 g 12.5 mL/hr over 240 Minutes Intravenous Every 8 hours 03/15/18 1553     03/15/18 1600  piperacillin-tazobactam (ZOSYN) IVPB 3.375 g     3.375 g 100 mL/hr over 30 Minutes Intravenous  Once 03/15/18 1553 03/15/18 1841   03/12/18 1700  ceFAZolin (ANCEF) IVPB 2g/100 mL premix     2 g 200 mL/hr over 30 Minutes Intravenous Every 8 hours 03/12/18 1605 03/12/18 1907   03/12/18 0644  ceFAZolin (ANCEF) IVPB 2g/100 mL premix     2 g 200 mL/hr over 30 Minutes Intravenous On call to O.R. 03/12/18 0644 03/12/18 0933       Objective: Vitals:   03/18/18 2355 03/19/18 0527 03/19/18 0801 03/19/18 1200  BP: (!) 104/52 (!) 102/54 (!) 106/57 125/66  Pulse: 80 78 76 83  Resp: 14 (!) 21 17 15   Temp: 99.5 F (37.5 C) 99.1 F (37.3 C) 98.8 F (37.1 C) 98.6 F (37 C)  TempSrc: Oral Oral Oral Axillary  SpO2: 93% 94% 96% 95%  Weight:  73.6 kg (  162 lb 4.1 oz)      Intake/Output Summary (Last 24 hours) at 03/19/2018 1451 Last data filed at 03/19/2018 1411 Gross per 24 hour  Intake 1829.17 ml  Output 825 ml  Net 1004.17 ml   Filed Weights   03/12/18 0655 03/19/18 0527  Weight: 68 kg (150 lb) 73.6 kg (162 lb 4.1 oz)    Examination: General exam: Appears comfortable  HEENT: PERRLA, oral mucosa dry Respiratory system: coarse breath sounds bilaterally. Respiratory effort normal. Cardiovascular system: S1 & S2 heard, RRR.   Gastrointestinal system: Abdomen soft, Normal bowel sound, staples intact Extremities: No cyanosis, clubbing or edema    Data Reviewed: I have personally reviewed following labs and imaging studies  CBC: Recent Labs  Lab 03/15/18 0653 03/15/18 0953 03/16/18 0549  03/17/18 0436 03/18/18 0854  WBC 17.5* 25.0* 15.5* 12.1* 16.4*  NEUTROABS  --  21.0*  --   --   --   HGB 9.3* 9.0* 7.9* 7.7* 8.4*  HCT 29.9* 29.4* 25.6* 24.7* 27.7*  MCV 88.7 88.6 89.2 88.8 88.2  PLT 238 240 240 272 469   Basic Metabolic Panel: Recent Labs  Lab 03/13/18 0805 03/14/18 0407 03/15/18 0653 03/18/18 0854 03/19/18 0451  NA 137 136 136 135  --   K 4.3 4.0 4.1 4.2  --   CL 105 103 103 103  --   CO2 21* 25 25 24   --   GLUCOSE 96 132* 128* 101*  --   BUN 7 5* 6 9  --   CREATININE 0.89 0.75 0.85 1.04 1.08  CALCIUM 8.0* 8.0* 8.4* 8.0*  --    GFR: Estimated Creatinine Clearance: 46.4 mL/min (by C-G formula based on SCr of 1.08 mg/dL). Liver Function Tests: No results for input(s): AST, ALT, ALKPHOS, BILITOT, PROT, ALBUMIN in the last 168 hours. No results for input(s): LIPASE, AMYLASE in the last 168 hours. No results for input(s): AMMONIA in the last 168 hours. Coagulation Profile: Recent Labs  Lab 03/18/18 1430  INR 1.43   Cardiac Enzymes: No results for input(s): CKTOTAL, CKMB, CKMBINDEX, TROPONINI in the last 168 hours. BNP (last 3 results) No results for input(s): PROBNP in the last 8760 hours. HbA1C: No results for input(s): HGBA1C in the last 72 hours. CBG: No results for input(s): GLUCAP in the last 168 hours. Lipid Profile: No results for input(s): CHOL, HDL, LDLCALC, TRIG, CHOLHDL, LDLDIRECT in the last 72 hours. Thyroid Function Tests: No results for input(s): TSH, T4TOTAL, FREET4, T3FREE, THYROIDAB in the last 72 hours. Anemia Panel: No results for input(s): VITAMINB12, FOLATE, FERRITIN, TIBC, IRON, RETICCTPCT in the last 72 hours. Urine analysis:    Component Value Date/Time   COLORURINE YELLOW 03/18/2018 1500   APPEARANCEUR CLEAR 03/18/2018 1500   LABSPEC 1.033 (H) 03/18/2018 1500   PHURINE 5.0 03/18/2018 1500   GLUCOSEU NEGATIVE 03/18/2018 1500   HGBUR NEGATIVE 03/18/2018 1500   BILIRUBINUR NEGATIVE 03/18/2018 1500   KETONESUR NEGATIVE  03/18/2018 1500   PROTEINUR NEGATIVE 03/18/2018 1500   UROBILINOGEN 0.2 07/18/2015 2220   NITRITE NEGATIVE 03/18/2018 1500   LEUKOCYTESUR NEGATIVE 03/18/2018 1500   Sepsis Labs: @LABRCNTIP (procalcitonin:4,lacticidven:4) ) Recent Results (from the past 240 hour(s))  Culture, blood (routine x 2)     Status: None (Preliminary result)   Collection Time: 03/18/18  8:45 AM  Result Value Ref Range Status   Specimen Description BLOOD RIGHT HAND  Final   Special Requests   Final    BOTTLES DRAWN AEROBIC ONLY Blood Culture adequate volume  Culture   Final    NO GROWTH 1 DAY Performed at [facility], [place] [address], [place], [place] [address]    Report Status PENDING  Incomplete  Culture, blood (routine x 2)     Status: None (Preliminary result)   Collection Time: 03/18/18  8:55 AM  Result Value Ref Range Status   Specimen Description BLOOD LEFT ARM  Final   Special Requests   Final    BOTTLES DRAWN AEROBIC ONLY Blood Culture adequate volume   Culture   Final    NO GROWTH 1 DAY Performed at [name], [place] [address] [place], [place] [address]    Report Status PENDING  Incomplete  Culture, Urine     Status: None   Collection Time: 03/18/18  2:29 PM  Result Value Ref Range Status   Specimen Description URINE, CATHETERIZED  Final   Special Requests NONE  Final   Culture   Final    NO GROWTH Performed at [facility], [address] [place], [place] [address]    Report Status 03/19/2018 FINAL  Final  Surgical PCR screen     Status: None   Collection Time: 03/18/18  4:57 PM  Result Value Ref Range Status   MRSA, PCR NEGATIVE NEGATIVE Final   Staphylococcus aureus NEGATIVE NEGATIVE Final    Comment: (NOTE) The Xpert SA Assay (FDA approved for NASAL specimens in patients 79 years of age and older), is one component of a comprehensive surveillance program. It is not intended to diagnose infection nor to guide or monitor treatment. Performed at [name], [place] [address], [place], [place] [address]          Radiology Studies: Ct Chest W Contrast  Result Date: 03/18/2018 CLINICAL DATA:  Recent partial gastrectomy. Fever of unknown origin. EXAM: CT CHEST, ABDOMEN, AND PELVIS WITH CONTRAST TECHNIQUE: Multidetector CT imaging of the chest, abdomen and pelvis was performed following the standard protocol during bolus administration of intravenous contrast. CONTRAST:  11mL ISOVUE-300 IOPAMIDOL (ISOVUE-300) INJECTION 61% COMPARISON:  Chest CT 12/30/2017.  Abdominal CT 02/17/2018. FINDINGS: CT CHEST FINDINGS Cardiovascular: Heart is mildly enlarged. Diffuse coronary artery and aortic atherosclerosis. No evidence of aortic aneurysm. Mediastinum/Nodes: No mediastinal, hilar, or axillary adenopathy. Lungs/Pleura: Moderate right pleural effusion and small left pleural effusion. Airspace disease with air bronchograms in both lower lobes could reflect compressive atelectasis although pneumonia is possible. Nodular airspace disease noted in the right upper lobe and right middle lobe concerning for pneumonia. Musculoskeletal: Diffuse degenerative disc changes. No acute bony abnormality. CT ABDOMEN PELVIS FINDINGS Hepatobiliary: Prior cholecystectomy.  No focal hepatic abnormality. Pancreas: No focal abnormality or ductal dilatation. Spleen: No focal abnormality.  Normal size. Adrenals/Urinary Tract: Small left adrenal nodule is stable. No right adrenal mass. Prior right nephrectomy. Cysts in the mid and lower pole of the left kidney. No hydronephrosis. Urinary bladder grossly unremarkable. Stomach/Bowel: Postoperative changes in the distal stomach from partial gastrectomy. No evidence of small bowel obstruction. Small amount of free air in the abdomen, likely postoperative. Vascular/Lymphatic: Diffuse aortic and iliac calcifications. Tortuous aorta. No evidence of adenopathy or aneurysm. Reproductive: No visible focal abnormality. Surgical clips in the lower  pelvis compatible with prior prostatectomy. Other: No free fluid or free air. Surgical clips along the mid line from recent abdominal surgery. Musculoskeletal: Severe scoliosis and degenerative changes. No acute findings. IMPRESSION: Moderate right pleural effusion and small left pleural effusion. Compressive atelectasis versus pneumonia in the lower lobes.  Nodular airspace disease throughout much of the right upper lobe and right middle lobe concerning for pneumonia. Cardiomegaly. Severe/diffuse coronary artery disease. Aortic atherosclerosis. No evidence of aortic aneurysm. Postoperative changes from recent partial gastrectomy. Small amount of free air in the abdomen, likely related to post op state. No evidence of bowel obstruction. Prior remote right nephrectomy and prostatectomy. Electronically Signed   By: Rolm Baptise M.D.   On: 03/18/2018 10:35   Ct Abdomen Pelvis W Contrast  Result Date: 03/18/2018 CLINICAL DATA:  Recent partial gastrectomy. Fever of unknown origin. EXAM: CT CHEST, ABDOMEN, AND PELVIS WITH CONTRAST TECHNIQUE: Multidetector CT imaging of the chest, abdomen and pelvis was performed following the standard protocol during bolus administration of intravenous contrast. CONTRAST:  12mL ISOVUE-300 IOPAMIDOL (ISOVUE-300) INJECTION 61% COMPARISON:  Chest CT 12/30/2017.  Abdominal CT 02/17/2018. FINDINGS: CT CHEST FINDINGS Cardiovascular: Heart is mildly enlarged. Diffuse coronary artery and aortic atherosclerosis. No evidence of aortic aneurysm. Mediastinum/Nodes: No mediastinal, hilar, or axillary adenopathy. Lungs/Pleura: Moderate right pleural effusion and small left pleural effusion. Airspace disease with air bronchograms in both lower lobes could reflect compressive atelectasis although pneumonia is possible. Nodular airspace disease noted in the right upper lobe and right middle lobe concerning for pneumonia. Musculoskeletal: Diffuse degenerative disc changes. No acute bony abnormality. CT  ABDOMEN PELVIS FINDINGS Hepatobiliary: Prior cholecystectomy.  No focal hepatic abnormality. Pancreas: No focal abnormality or ductal dilatation. Spleen: No focal abnormality.  Normal size. Adrenals/Urinary Tract: Small left adrenal nodule is stable. No right adrenal mass. Prior right nephrectomy. Cysts in the mid and lower pole of the left kidney. No hydronephrosis. Urinary bladder grossly unremarkable. Stomach/Bowel: Postoperative changes in the distal stomach from partial gastrectomy. No evidence of small bowel obstruction. Small amount of free air in the abdomen, likely postoperative. Vascular/Lymphatic: Diffuse aortic and iliac calcifications. Tortuous aorta. No evidence of adenopathy or aneurysm. Reproductive: No visible focal abnormality. Surgical clips in the lower pelvis compatible with prior prostatectomy. Other: No free fluid or free air. Surgical clips along the mid line from recent abdominal surgery. Musculoskeletal: Severe scoliosis and degenerative changes. No acute findings. IMPRESSION: Moderate right pleural effusion and small left pleural effusion. Compressive atelectasis versus pneumonia in the lower lobes. Nodular airspace disease throughout much of the right upper lobe and right middle lobe concerning for pneumonia. Cardiomegaly. Severe/diffuse coronary artery disease. Aortic atherosclerosis. No evidence of aortic aneurysm. Postoperative changes from recent partial gastrectomy. Small amount of free air in the abdomen, likely related to post op state. No evidence of bowel obstruction. Prior remote right nephrectomy and prostatectomy. Electronically Signed   By: Rolm Baptise M.D.   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 Abnormal pulmonary auscultation. EXAM: PORTABLE CHEST 1 VIEW COMPARISON:  03/15/2018 and earlier. FINDINGS: Portable AP semi upright  view at at 0852 hours. Progressed and now confluent and more extensive opacity in the right mid and lower lung. Skin fold artifact suspected. No pneumothorax. Small right pleural effusion is possible. Lower lung volumes than baseline with underlying chronic elevation of the right hemidiaphragm. Stable cardiac size and mediastinal contours. Calcified aortic atherosclerosis. Left lung appears clear allowing for portable technique. The postoperative intraperitoneal air is no longer evident. IMPRESSION: 1. Worsening opacity in the right mid and lower lung suggestive of Multilobar Pneumonia. Possible small pleural effusion. 2. The left lung remains clear. Electronically Signed  By: Genevie Ann M.D.   On: 03/18/2018 09:22      Scheduled Meds: . bisacodyl  10 mg Rectal Daily  . enoxaparin (LOVENOX) injection  40 mg Subcutaneous Q24H  . feeding supplement (GLUCERNA SHAKE)  237 mL Oral BID BM  . gabapentin  300 mg Oral TID  . Melatonin  3 mg Oral QHS  . mirtazapine  7.5 mg Oral QHS  . morphine  30 mg Oral Q6H  . multivitamin with minerals  1 tablet Oral Daily  . naloxegol oxalate  25 mg Oral Daily  . pantoprazole sodium  40 mg Per Tube QHS  . protein supplement shake  11 oz Oral BID BM  . senna-docusate  1 tablet Oral BID   Continuous Infusions: . dextrose 5 % and 0.45 % NaCl with KCl 20 mEq/L 50 mL/hr at 03/18/18 1229  . lactated ringers 10 mL/hr at 03/12/18 0708  . methocarbamol (ROBAXIN)  IV Stopped (03/19/18 0558)  . piperacillin-tazobactam (ZOSYN)  IV Stopped (03/19/18 1008)     LOS: 7 days    Time spent in minutes: 35    Debbe Odea, MD [facility] Pager: [email or web address] Password [email or web address] 03/19/2018, 2:51 PM

## 2018-03-20 LAB — BASIC METABOLIC PANEL
ANION GAP: 6 (ref 5–15)
BUN: 7 mg/dL (ref 6–20)
CHLORIDE: 108 mmol/L (ref 101–111)
CO2: 23 mmol/L (ref 22–32)
Calcium: 7.5 mg/dL — ABNORMAL LOW (ref 8.9–10.3)
Creatinine, Ser: 0.89 mg/dL (ref 0.61–1.24)
GFR calc Af Amer: >60 mL/min (ref >60)
GLUCOSE: 118 mg/dL — AB (ref 65–99)
Potassium: 3.5 mmol/L (ref 3.5–5.1)
Sodium: 137 mmol/L (ref 135–145)

## 2018-03-20 LAB — CBC
HEMATOCRIT: 23.7 % — AB (ref 39.0–52.0)
HEMOGLOBIN: 7.3 g/dL — AB (ref 13.0–17.0)
MCH: 26.9 pg (ref 26.0–34.0)
MCHC: 30.8 g/dL (ref 30.0–36.0)
MCV: 87.5 fL (ref 78.0–100.0)
Platelets: 379 10*3/uL (ref 150–400)
RBC: 2.71 MIL/uL — AB (ref 4.22–5.81)
RDW: 15.7 % — ABNORMAL HIGH (ref 11.5–15.5)
WBC: 12.6 10*3/uL — AB (ref 4.0–10.5)

## 2018-03-20 MED ORDER — METHOCARBAMOL 500 MG PO TABS: 500.0000 mg | ORAL_TABLET | Freq: Three times a day (TID) | ORAL | Status: DC | PRN

## 2018-03-20 MED ORDER — SENNOSIDES-DOCUSATE SODIUM 8.6-50 MG PO TABS: 1.0000 | ORAL_TABLET | Freq: Every evening | ORAL | Status: DC | PRN

## 2018-03-20 MED ORDER — MORPHINE SULFATE 15 MG PO TABS
30.0000 mg | ORAL_TABLET | Freq: Four times a day (QID) | ORAL | Status: DC | PRN
  Administered 2018-03-21: 30 mg via ORAL
  Filled 2018-03-20: qty 2

## 2018-03-20 MED ORDER — POTASSIUM CHLORIDE CRYS ER 20 MEQ PO TBCR
40.0000 meq | EXTENDED_RELEASE_TABLET | Freq: Once | ORAL | Status: AC
  Administered 2018-03-20: 40 meq via ORAL
  Filled 2018-03-20: qty 2

## 2018-03-20 MED ORDER — PANTOPRAZOLE SODIUM 40 MG PO TBEC
40.0000 mg | DELAYED_RELEASE_TABLET | Freq: Every day | ORAL | Status: DC
  Administered 2018-03-20 – 2018-03-24 (×5): 40 mg via ORAL
  Filled 2018-03-20 (×5): qty 1

## 2018-03-20 NOTE — Progress Notes (Signed)
Progress Note: General Surgery Service   Assessment/Plan: Patient Active Problem List   Diagnosis Date Noted  . Multifocal pneumonia 03/18/2018  . Sepsis ([place]) 03/18/2018  . Malignant gastrointestinal stromal tumor (GIST) of stomach ([place]) 03/12/2018  . Urinary retention 02/24/2018  . Gastrointestinal stromal tumor (GIST) of stomach ([place])   . Restless leg syndrome   . Acute GI bleeding 02/16/2018  . Acute blood loss anemia 02/16/2018  . Constipation 01/20/2018  . Exertional shortness of breath 12/30/2017  . Rupture of left quadriceps muscle 07/07/2017  . Rupture of left quadriceps tendon 06/26/2017  . Eczema 06/26/2017  . Chest pain 07/29/2015  . OSA on CPAP 07/10/2015  . CPAP use counseling 07/10/2015  . Excessive daytime sleepiness 02/22/2015  . Sleep apnea 02/22/2015  . Snoring 02/22/2015  . Nocturia more than twice per night 02/22/2015  . Syncope 12/31/2012  . Bifascicular bundle branch block 12/31/2012    Class: Acute  . Cellulitis and abscess of leg, except foot 12/04/2012  . Chronic pain 12/04/2012  . Kyphosis 12/04/2012  . H/O unilateral nephrectomy 12/04/2012  . H/O prostate cancer 12/04/2012  . S/P TURP 12/04/2012   s/p Procedure(s): LAPAROSCOPIC ASSISTED PARTIAL GASTRECTOMY ERAS PATHWAY 03/12/2018  -continue abx -continue diet   LOS: 8 days  Chief Complaint/Subjective: No abdominal pain, not coughing much  Objective: Vital signs in last 24 hours: Temp:  [98.6 F (37 C)-100 F (37.8 C)] 98.9 F (37.2 C) (04/19 0826) Pulse Rate:  [64-83] 74 (04/19 0826) Resp:  [15-18] 17 (04/19 0411) BP: (102-125)/(55-66) 104/55 (04/19 0826) SpO2:  [94 %-96 %] 95 % (04/19 0411) Weight:  [75.8 kg (167 lb 1.7 oz)] 75.8 kg (167 lb 1.7 oz) (04/19 0411) Last BM Date: 03/19/18  Intake/Output from previous day: 04/18 0701 - 04/19 0700 In: 1975 [P.O.:720; I.V.:1045; IV Piggyback:210] Out: 500 [Urine:500] Intake/Output this shift: No intake/output data  recorded.  Lungs: CTAB  Cardiovascular: RRR  Abd: soft, NT, ND  Extremities: no edema  Neuro: AOx4  Lab Results: CBC  Recent Labs    03/18/18 0854 03/20/18 0325  WBC 16.4* 12.6*  HGB 8.4* 7.3*  HCT 27.7* 23.7*  PLT 330 379   BMET Recent Labs    03/18/18 0854 03/19/18 0451 03/20/18 0325  NA 135  --  137  K 4.2  --  3.5  CL 103  --  108  CO2 24  --  23  GLUCOSE 101*  --  118*  BUN 9  --  7  CREATININE 1.04 1.08 0.89  CALCIUM 8.0*  --  7.5*   PT/INR Recent Labs    03/18/18 1430  LABPROT 17.3*  INR 1.43   ABG No results for input(s): PHART, HCO3 in the last 72 hours.  Invalid input(s): PCO2, PO2  Studies/Results:  Anti-infectives: Anti-infectives (From admission, onward)   Start     Dose/Rate Route Frequency Ordered Stop   03/19/18 1400  vancomycin (VANCOCIN) IVPB 1000 mg/200 mL premix  Status:  Discontinued     1,000 mg 200 mL/hr over 60 Minutes Intravenous Every 24 hours 03/18/18 1228 03/19/18 0820   03/19/18 1300  ceFEPIme (MAXIPIME) 1 g in sodium chloride 0.9 % 100 mL IVPB  Status:  Discontinued     1 g 200 mL/hr over 30 Minutes Intravenous Every 24 hours 03/18/18 1228 03/18/18 1311   03/18/18 1230  vancomycin (VANCOCIN) 1,250 mg in sodium chloride 0.9 % 250 mL IVPB     1,250 mg 166.7 mL/hr over 90 Minutes Intravenous  Once  03/18/18 1228 03/18/18 1600   03/18/18 1230  ceFEPIme (MAXIPIME) 2 g in sodium chloride 0.9 % 100 mL IVPB  Status:  Discontinued     2 g 200 mL/hr over 30 Minutes Intravenous  Once 03/18/18 1228 03/18/18 1340   03/15/18 2300  piperacillin-tazobactam (ZOSYN) IVPB 3.375 g     3.375 g 12.5 mL/hr over 240 Minutes Intravenous Every 8 hours 03/15/18 1553     03/15/18 1600  piperacillin-tazobactam (ZOSYN) IVPB 3.375 g     3.375 g 100 mL/hr over 30 Minutes Intravenous  Once 03/15/18 1553 03/15/18 1841   03/12/18 1700  ceFAZolin (ANCEF) IVPB 2g/100 mL premix     2 g 200 mL/hr over 30 Minutes Intravenous Every 8 hours 03/12/18 1605  03/12/18 1907   03/12/18 0644  ceFAZolin (ANCEF) IVPB 2g/100 mL premix     2 g 200 mL/hr over 30 Minutes Intravenous On call to O.R. 03/12/18 5056 03/12/18 0933      Medications: Scheduled Meds: . enoxaparin (LOVENOX) injection  40 mg Subcutaneous Q24H  . feeding supplement (GLUCERNA SHAKE)  237 mL Oral BID BM  . gabapentin  300 mg Oral TID  . Melatonin  3 mg Oral QHS  . mirtazapine  7.5 mg Oral QHS  . multivitamin with minerals  1 tablet Oral Daily  . pantoprazole  40 mg Oral Daily  . protein supplement shake  11 oz Oral BID BM   Continuous Infusions: . piperacillin-tazobactam (ZOSYN)  IV 3.375 g (03/20/18 0603)   PRN Meds:.acetaminophen, cetaphil, diphenhydrAMINE **OR** diphenhydrAMINE, methocarbamol, morphine, polyvinyl alcohol, senna-docusate, sodium chloride  Mickeal Skinner, MD Pg# [phone] [facility]

## 2018-03-20 NOTE — Progress Notes (Addendum)
Subjective: Casey Gates is an 84 y/o with chronic pain, h/o prostate CA, OSA not on CPAP, nephrectomy, RLS and h/o GI bleeds with recent removal of GIST tumor (partial gastrectomy on 4/11). [facility] were called for management of sepsis due to aspiration pneumonia on 4/18. [facility] taking over care 4/19.   Objective: Patient was seen and examined at bedside this morning.  States he has been feeling weak and has low energy.  He has been sleeping a lot and does not have much of an appetite.  Denies having any nausea or vomiting.  Able to tolerate PO intake. States he has been having multiple bowel movements per day.  Denies having any pain.  States his breathing is okay and his cough has been improving; he has not coughed today.  No other complaints.  Vital signs in last 24 hours: Vitals:   03/19/18 1627 03/19/18 1933 03/19/18 2335 03/20/18 0411  BP: (!) 111/57 (!) 113/56 (!) 106/56 (!) 102/56  Pulse: 80 78 66 64  Resp: 17 15 18 17   Temp: 99 F (37.2 C) 100 F (37.8 C) 99.1 F (37.3 C) 98.7 F (37.1 C)  TempSrc: Oral Oral Oral Oral  SpO2: 94% 96% 94% 95%  Weight:    167 lb 1.7 oz (75.8 kg)   Physical Exam  Constitutional: He is oriented to person, place, and time. No distress.  Resting comfortably in hospital bed  HENT:  Mouth/Throat: Oropharynx is clear and moist.  Eyes: Right eye exhibits no discharge. Left eye exhibits no discharge.  Cardiovascular: Normal rate, regular rhythm and intact distal pulses.  Pulmonary/Chest: Effort normal and breath sounds normal. No respiratory distress. He has no wheezes. He has no rales.  Abdominal: Soft. Bowel sounds are normal. He exhibits no distension. There is no tenderness.  Surgical site: Staples intact.  No erythema or drainage.  Musculoskeletal: He exhibits no edema.  Neurological: He is alert and oriented to person, place, and time.  Skin: Skin is warm and dry.    Assessment/Plan:  Principal Problem:   Multifocal  pneumonia Active Problems:   Malignant gastrointestinal stromal tumor (GIST) of stomach ([facility])   Sepsis ([facility])  Aspiration pneumonia: Fever 103 on 4/17, WBC up to 16.4 from 12.1 the previous day. CT chest showing nodular infiltrate in right lung suggestive of infection and bibasilar atelectasis vs pneumonia. In addition, showing a moderate right pleural effusion and small left pleural effusion. Tmax 100 F in the past 48 hrs. White count now down to 12.6. Blood cx drawn 4/18 with NGTD. Currently requiring 3L O2 via [place]. Patient received Vancomycin on 4/17. Today is day #6 of Zosyn.  -Continue Zosyn -Supplemental O2 prn -Incentive spirometry  -PT/OT -CBC in am  Malignant gastrointestinal stromal tumor (GIST) of stomach: S/p lap partial gastrectomy on 4/11. POD #8. Hgb 7.3 today. K 3.5. Able to tolerate PO intake. Pain well controlled.  -Surgery following; appreciate recs  -Soft diet  -Will need outpatient oncology follow-up  -Continue to monitor electrolytes; replete prn -CBC in am  Chronic pain with chronic constipation: On Neurontin and Opana PRN at home. Patient reports having multiple BMs.  -Cont neurontin 300 mg TID -Morphine IR 30 mg q6 prn -Robaxin 500 mg Q8 hrs prn -Hold Dulcolax and Movantik -Senokot-S qhs prn  Depression/ insomnia -Continue Mirtazapine 7.5 mg qhs -Continue Melatonin 3 mg qhs  FEN/ GI: Soft diet; glucerna shakes, protein supplement, Protonix 40 mg daily   DVT ppx: Lovenox  Dispo: Anticipated discharge in approximately 2-3  day(s). PT recommended SNF.   Shela Leff, MD 03/20/2018, 7:12 AM Pager: [phone]

## 2018-03-20 NOTE — Discharge Summary (Signed)
Name: Casey Gates MRN: [id number] DOB: Jan 05, 1926 84 y.o. PCP: Velna Hatchet, MD  Date of Admission: 03/12/2018  6:39 AM Date of Discharge: 03/24/18 Attending Physician: Aldine Contes, MD  Discharge Diagnosis: 1.  Principal Problem:   Multifocal pneumonia Active Problems:   Malignant gastrointestinal stromal tumor (GIST) of stomach ([place])   Sepsis ([place])   Discharge Medications: Allergies as of 03/24/2018      Reactions   Coconut Flavor [flavoring Agent] Hypertension   Anything that is related to coconut---LOSS OF CONSCIOUSNESS ([place])   Coconut Oil Other (See Comments)   Anything that is related to coconut--- LOSS OF CONSCIOUSNESS (SYNCOPE)   Food Anaphylaxis   ALLERGIC TO ALL NUTS WITH THE EXCEPTION OF CASHEWS, ALMONDS, AND PEANUTS.      Medication List    STOP taking these medications   bisacodyl 10 MG suppository Commonly known as:  DULCOLAX   cetaphil cream Replaced by:  cetaphil lotion   LUBRICANT EYE DROPS 0.4-0.3 % Soln Generic drug:  Polyethyl Glycol-Propyl Glycol Replaced by:  polyvinyl alcohol 1.4 % ophthalmic solution   Melatonin 5 MG Caps Replaced by:  Melatonin 3 MG Tabs   MOVANTIK 25 MG Tabs tablet Generic drug:  naloxegol oxalate   oxymorphone 10 MG tablet Commonly known as:  OPANA Replaced by:  morphine 30 MG tablet   sodium chloride 0.65 % Soln nasal spray Commonly known as:  OCEAN     TAKE these medications   acetaminophen 650 MG suppository Commonly known as:  TYLENOL Place 1 suppository (650 mg total) rectally every 4 (four) hours as needed for fever.   amoxicillin-clavulanate 875-125 MG tablet Commonly known as:  AUGMENTIN Take 1 tablet by mouth 2 (two) times daily for 5 days.   cetaphil lotion Apply 1 application topically 4 (four) times daily as needed (FOR DRY SKIN.). Replaces:  cetaphil cream   feeding supplement (GLUCERNA SHAKE) Liqd Take 237 mLs by mouth 2 (two) times daily between meals.   gabapentin 300 MG  capsule Commonly known as:  NEURONTIN Take 1 capsule (300 mg total) by mouth 3 (three) times daily.   guaiFENesin 100 MG/5ML Soln Commonly known as:  ROBITUSSIN Take 5 mLs (100 mg total) by mouth every 4 (four) hours as needed for cough or to loosen phlegm.   loperamide 2 MG capsule Commonly known as:  IMODIUM Take 1 capsule (2 mg total) by mouth as needed for diarrhea or loose stools.   Melatonin 3 MG Tabs Take 1 tablet (3 mg total) by mouth at bedtime. Replaces:  Melatonin 5 MG Caps   methocarbamol 500 MG tablet Commonly known as:  ROBAXIN Take 1 tablet (500 mg total) by mouth every 8 (eight) hours as needed for muscle spasms.   mirtazapine 7.5 MG tablet Commonly known as:  REMERON Take 7.5 mg by mouth at bedtime.   morphine 30 MG tablet Commonly known as:  MSIR Take 1 tablet (30 mg total) by mouth every 6 (six) hours as needed for up to 5 days for severe pain. Replaces:  oxymorphone 10 MG tablet   multivitamin with minerals Tabs tablet Take 1 tablet by mouth daily. Start taking on:  03/25/2018   pantoprazole 40 MG tablet Commonly known as:  PROTONIX Take 1 tablet (40 mg total) by mouth daily. Start taking on:  03/25/2018   polyvinyl alcohol 1.4 % ophthalmic solution Commonly known as:  LIQUIFILM TEARS Place 1-2 drops into both eyes 3 (three) times daily as needed (FOR DRY EYES.). Replaces:  LUBRICANT EYE  DROPS 0.4-0.3 % Soln   protein supplement shake Liqd Commonly known as:  PREMIER PROTEIN Take 325 mLs (11 oz total) by mouth 2 (two) times daily between meals.   senna-docusate 8.6-50 MG tablet Commonly known as:  Senokot-S Take 1 tablet by mouth at bedtime as needed for mild constipation. What changed:    when to take this  reasons to take this       Disposition and follow-up:   Casey Gates was discharged from [facility] in Stable condition.  At the hospital follow up visit please address:  Malignant GI stromal cell tumor of  stomach  -follow up with surgery -follow up with oncolocy, discussed with son and he stated that Dr. Barry Dienes will be referring pt to oncologist  Aspiration Pneumonia  -continue course of augmentin until completion -will need repeat imaging of chest can begin with PA 2 view x-ray in about 4 weeks  Protein malnutrition  -will need continued protein supplementation   2.  Labs / imaging needed at time of follow-up: cbc, cmp, Chest x-ray  3.  Pending labs/ test needing follow-up: pleural fluid culture-expect that this will be negative, fluid cytology  Follow-up Appointments:  Contact information for follow-up providers    Stark Klein, MD In 2 weeks.   Specialty:  General Surgery Contact information: [place] [address] [phone]        Velna Hatchet, MD Follow up in 1 week(s).   Specialty:  Internal Medicine Why:  Please make a follow up appointment with your PCP Contact information: [place] [address] [phone]        [name], MD .   Specialties:  Cardiology, Radiology, Interventional Cardiology Contact information: [address] [place] [address] [phone]            Contact information for after-discharge care    Destination    [facility] .   Service:  Skilled Nursing Contact information: [place] [facility] Course by problem list: Principal Problem:   Multifocal pneumonia Active Problems:   Malignant gastrointestinal stromal tumor (GIST) of stomach ([place])   Sepsis ([place])   Pt was admitted for laparoscopic resection of a GI stromal cell tumor which was discerned by biopsy from an EGD secondary to a GI bleed.  He tolerated the procedure well, his abdominal pain was getting better, he was healing well from his surgery however he began to develop an multifocal pneumonia after  his procedure and there was concern for an aspiration event.  Therefore he was transferred to the medicine service for further management.  A CT was performed which showed a moderate right pleural effusion and small left pleural effusion.  He was started on broad-spectrum antibiotics.  Then switched to just Zosyn from Vancomycin and Zosyn.  Once transferred to the medicine service, he was transitioned to Unasyn.  Due to the nature of the effusion it was felt the patient would benefit from thoracentesis.  780cc of fluid was withdrawn from the pleural space it was exudative, the gram stain was negative and there was not growth on culture to date.  He received a total of 9 days of IV antibiotics.  Due to the nature of his infection he was transitioned to 5 days further of oral augmentin for a total of 14  days of antibiotic therapy.   Discharge Vitals:   BP 126/62 (BP Location: Right Arm)   Pulse 66   Temp 98.9 F (37.2 C) (Oral)   Resp (!) 28   Wt 157 lb 3 oz (71.3 kg)   SpO2 94%   BMI 23.21 kg/m   Pertinent Labs, Studies, and Procedures:  CBC Latest Ref Rng & Units 03/24/2018 03/23/2018 03/23/2018  WBC 4.0 - 10.5 K/uL 11.4(H) 11.7(H) 9.7  Hemoglobin 13.0 - 17.0 g/dL 8.3(L) 8.2(L) 6.6(LL)  Hematocrit 39.0 - 52.0 % 27.2(L) 26.5(L) 21.1(L)  Platelets 150 - 400 K/uL 470(H) 496(H) 375   BMP Latest Ref Rng & Units 03/24/2018 03/23/2018 03/23/2018  Glucose 65 - 99 mg/dL 99 115(H) 81  BUN 6 - 20 mg/dL 7 8 7   Creatinine 0.61 - 1.24 mg/dL 0.77 0.87 1.78(H)  Sodium 135 - 145 mmol/L 140 139 147(H)  Potassium 3.5 - 5.1 mmol/L 3.4(L) 3.6 2.9(L)  Chloride 101 - 111 mmol/L 109 108 111  CO2 22 - 32 mmol/L 25 24 22   Calcium 8.9 - 10.3 mg/dL 7.7(L) 7.8(L) 6.5(L)   Culture, body fluid-bottle [[id number]]Collected: 03/22/18 0928 Order Status: CompletedSpecimen: PleuraUpdated: 03/23/18 1306 Specimen DescriptionPLEURAL RIGHT Special RequestsNONE Culture-- NO GROWTH 1 DAY  Performed at [facility], [place] [address], [place], [place] [address]  Report StatusPENDING Culture, blood (routine x 2) [[id number]]Collected: 03/18/18 0845 Order Status: CompletedSpecimen: BLOOD RIGHT HANDUpdated: 03/23/18 1302 Specimen DescriptionBLOOD RIGHT HAND Special RequestsBOTTLES DRAWN AEROBIC ONLY Blood Culture adequate volume Culture-- NO GROWTH 5 DAYS  Performed at [facility], [place] [address] [place], [place] [address]  Report Status04/22/2019 FINAL Culture, blood (routine x 2) [[id number]]Collected: 03/18/18 0855 Order Status: CompletedSpecimen: BLOOD LEFT ARMUpdated: 03/23/18 1302 Specimen DescriptionBLOOD LEFT ARM Special RequestsBOTTLES DRAWN AEROBIC ONLY Blood Culture adequate volume Culture-- NO GROWTH 5 DAYS  Performed at [facility], [place] [address], [place], [place] [address]  Report Status04/22/2019 FINAL C difficile quick scan w PCR reflex [[id number]]Collected: 03/22/18 1452 Order Status: CompletedSpecimen: StoolUpdated: 03/22/18 1631 C Diff antigenNEGATIVE C Diff toxinNEGATIVE C Diff interpretationNo C. difficile detected.  Comment: Performed at [facility], [place] [address], [place], [place] [address]  Gram stain [[id number]]YJEHUDJSH: 03/22/18 7026 Order Status: CompletedSpecimen: PleuraUpdated: 03/22/18 1234 Specimen DescriptionPLEURAL RIGHT Special RequestsNONE Gram Stain-- RARE WBC PRESENT, PREDOMINANTLY MONONUCLEAR  NO ORGANISMS SEEN  Performed at [facility] [address] [place],  [address]  Report Status04/21/2019 FINAL Body fluid culture [[id number]]Collected: 03/22/18 8502 Order Status: CanceledSpecimen: Pleural Fluid from Pleural, Right Culture, Urine [[id number]]Collected: 03/18/18 1429 Order Status: CompletedSpecimen: Urine, CatheterizedUpdated: 03/19/18 1106 Specimen DescriptionURINE, CATHETERIZED Special RequestsNONE Culture-- NO GROWTH  Performed at [facility], [place] [address] [place],  [address]  Report Status04/18/2019 FINAL Surgical PCR screen  [[id number]]Collected: 03/18/18 1657 Order Status: CompletedSpecimen: Nasal Swab from Nasal MucosaUpdated: 03/18/18 1937 MRSA, PCRNEGATIVE Staphylococcus aureusNEGATIVE  Comment: (NOTE)     CLINICAL DATA:  Status post right thoracentesis today.   EXAM: CHEST  1 VIEW   COMPARISON:  CT chest and single view of the chest 03/18/2018.   FINDINGS: Right pleural effusion is decreased after thoracentesis. No pneumothorax. Patchy airspace disease throughout the right chest persists but is improved. Left basilar airspace disease is unchanged. Heart size is upper normal.   IMPRESSION: Negative for pneumothorax after right thoracentesis.   Patchy airspace disease throughout the right chest persists but is improved. Left basilar airspace disease is unchanged.     Electronically Signed  By: Inge Rise M.D.   On: 03/22/2018 09:40  CLINICAL DATA:  Recent partial gastrectomy. Fever of unknown origin.   EXAM: CT CHEST, ABDOMEN, AND PELVIS WITH CONTRAST   TECHNIQUE: Multidetector CT imaging of the chest, abdomen and pelvis was performed following the standard protocol during bolus administration of intravenous contrast.   CONTRAST:  182mL ISOVUE-300 IOPAMIDOL (ISOVUE-300) INJECTION 61%   COMPARISON:  Chest CT 12/30/2017.  Abdominal CT 02/17/2018.   FINDINGS: CT CHEST FINDINGS   Cardiovascular: Heart is mildly enlarged. Diffuse coronary artery and aortic atherosclerosis. No evidence of aortic aneurysm.   Mediastinum/Nodes: No mediastinal, hilar, or axillary adenopathy.   Lungs/Pleura: Moderate right pleural effusion and small left pleural effusion. Airspace disease with air bronchograms in both lower lobes could reflect compressive atelectasis although pneumonia is possible. Nodular airspace disease noted in the right upper lobe and right middle lobe concerning for pneumonia.   Musculoskeletal: Diffuse degenerative disc changes. No acute bony abnormality.   CT ABDOMEN  PELVIS FINDINGS   Hepatobiliary: Prior cholecystectomy.  No focal hepatic abnormality.   Pancreas: No focal abnormality or ductal dilatation.   Spleen: No focal abnormality.  Normal size.   Adrenals/Urinary Tract: Small left adrenal nodule is stable. No right adrenal mass. Prior right nephrectomy. Cysts in the mid and lower pole of the left kidney. No hydronephrosis. Urinary bladder grossly unremarkable.   Stomach/Bowel: Postoperative changes in the distal stomach from partial gastrectomy. No evidence of small bowel obstruction. Small amount of free air in the abdomen, likely postoperative.   Vascular/Lymphatic: Diffuse aortic and iliac calcifications. Tortuous aorta. No evidence of adenopathy or aneurysm.   Reproductive: No visible focal abnormality. Surgical clips in the lower pelvis compatible with prior prostatectomy.   Other: No free fluid or free air. Surgical clips along the mid line from recent abdominal surgery.   Musculoskeletal: Severe scoliosis and degenerative changes. No acute findings.   IMPRESSION: Moderate right pleural effusion and small left pleural effusion. Compressive atelectasis versus pneumonia in the lower lobes. Nodular airspace disease throughout much of the right upper lobe and right middle lobe concerning for pneumonia.   Cardiomegaly. Severe/diffuse coronary artery disease. Aortic atherosclerosis. No evidence of aortic aneurysm.   Postoperative changes from recent partial gastrectomy. Small amount of free air in the abdomen, likely related to post op state. No evidence of bowel obstruction.   Prior remote right nephrectomy and prostatectomy.     Electronically Signed   By: Rolm Baptise M.D.   On: 03/18/2018 10:35   INDICATION: Shortness of breath. Right-sided pleural effusion. Request diagnostic and therapeutic thoracentesis.   EXAM: ULTRASOUND GUIDED RIGHT THORACENTESIS   MEDICATIONS: None.   COMPLICATIONS: None immediate.     PROCEDURE: An ultrasound guided thoracentesis was thoroughly discussed with the patient and questions answered. The benefits, risks, alternatives and complications were also discussed. The patient understands and wishes to proceed with the procedure. Written consent was obtained.   Ultrasound was performed to localize and mark an adequate pocket of fluid in the right chest. The area was then prepped and draped in the normal sterile fashion. 1% Lidocaine was used for local anesthesia. Under ultrasound guidance a Safe-T-Centesis catheter was introduced. Thoracentesis was performed. The catheter was removed and a dressing applied.   FINDINGS: A total of approximately 780 mL of clear, amber colored fluid was removed. Samples were sent to the laboratory as requested by the clinical team.   IMPRESSION: Successful ultrasound guided right thoracentesis yielding 780 mL of pleural fluid.  Read by: Ascencion Dike PA-C     Electronically Signed   By: Jacqulynn Cadet M.D.   On: 03/22/2018 09:43  NOB09 - 192 U/L109 Comment: Performed at [facility], [place] [address], [place], [place] [address]   Status:  Final result  Visible to patient:  No (Not Released)  Next appt:  None   Ref Range & Units 2d ago  LD, Fluid 3 - 23 U/L 109High    Comment: (NOTE)  Results should be evaluated in conjunction with serum values   Fluid Type-FLDH  Pleural R   Comment: Performed at [facility], [place] [address], [place], [place] [address]  Resulting Agency  [facility]        Discharge Instructions: Discharge Instructions    Call MD for:  difficulty breathing, headache or visual disturbances   Complete by:  As directed    Call MD for:  extreme fatigue   Complete by:  As directed    Call MD for:  hives   Complete by:  As directed    Call MD for:  persistant dizziness or light-headedness   Complete by:  As directed    Call MD for:  persistant nausea and vomiting   Complete by:  As  directed    Call MD for:  redness, tenderness, or signs of infection (pain, swelling, redness, odor or green/yellow discharge around incision site)   Complete by:  As directed    Call MD for:  severe uncontrolled pain   Complete by:  As directed    Call MD for:  temperature >100.4   Complete by:  As directed    Diet - low sodium heart healthy   Complete by:  As directed    Discharge instructions   Complete by:  As directed    Casey Gates will need to continue and work on getting stronger with physical therapy.  He will need to increase his protein intake.  He will finish his course of antibiotics and need to follow his pneumonia for resolution with chest imaging as outlined in his discharge summary.  Please have him follow up with his PCP, Surgeon and the oncologist who has been chosen by the surgeon.  He will need continued support as he recovers from this ordeal.   Increase activity slowly   Complete by:  As directed       Signed: Katherine Roan, MD 03/24/2018, 10:59 AM

## 2018-03-20 NOTE — Progress Notes (Signed)
CSW continuing to follow. CSW to support with discharge back to SNF when medically ready.  Casey Gates, [name]

## 2018-03-21 DIAGNOSIS — Z79891 Long term (current) use of opiate analgesic: Secondary | ICD-10-CM

## 2018-03-21 DIAGNOSIS — Z79899 Other long term (current) drug therapy: Secondary | ICD-10-CM

## 2018-03-21 DIAGNOSIS — G4700 Insomnia, unspecified: Secondary | ICD-10-CM

## 2018-03-21 DIAGNOSIS — J690 Pneumonitis due to inhalation of food and vomit: Secondary | ICD-10-CM

## 2018-03-21 DIAGNOSIS — Z931 Gastrostomy status: Secondary | ICD-10-CM

## 2018-03-21 DIAGNOSIS — F339 Major depressive disorder, recurrent, unspecified: Secondary | ICD-10-CM

## 2018-03-21 DIAGNOSIS — K5909 Other constipation: Secondary | ICD-10-CM

## 2018-03-21 DIAGNOSIS — R0902 Hypoxemia: Secondary | ICD-10-CM

## 2018-03-21 LAB — BASIC METABOLIC PANEL
ANION GAP: 7 (ref 5–15)
BUN: 6 mg/dL (ref 6–20)
CALCIUM: 7.6 mg/dL — AB (ref 8.9–10.3)
CO2: 25 mmol/L (ref 22–32)
CREATININE: 0.91 mg/dL (ref 0.61–1.24)
Chloride: 107 mmol/L (ref 101–111)
GFR calc non Af Amer: >60 mL/min (ref >60)
Glucose, Bld: 106 mg/dL — ABNORMAL HIGH (ref 65–99)
Potassium: 3.9 mmol/L (ref 3.5–5.1)
SODIUM: 139 mmol/L (ref 135–145)

## 2018-03-21 LAB — CBC
HCT: 24.6 % — ABNORMAL LOW (ref 39.0–52.0)
HEMOGLOBIN: 7.6 g/dL — AB (ref 13.0–17.0)
MCH: 26.6 pg (ref 26.0–34.0)
MCHC: 30.9 g/dL (ref 30.0–36.0)
MCV: 86 fL (ref 78.0–100.0)
Platelets: 452 10*3/uL — ABNORMAL HIGH (ref 150–400)
RBC: 2.86 MIL/uL — ABNORMAL LOW (ref 4.22–5.81)
RDW: 16.1 % — ABNORMAL HIGH (ref 11.5–15.5)
WBC: 10.8 10*3/uL — AB (ref 4.0–10.5)

## 2018-03-21 MED ORDER — ENOXAPARIN SODIUM 40 MG/0.4ML ~~LOC~~ SOLN
40.0000 mg | SUBCUTANEOUS | Status: DC
  Administered 2018-03-23 – 2018-03-24 (×2): 40 mg via SUBCUTANEOUS
  Filled 2018-03-21 (×2): qty 0.4

## 2018-03-21 MED ORDER — LACTULOSE 10 GM/15ML PO SOLN: 20.0000 g | Freq: Every day | ORAL | Status: DC | PRN

## 2018-03-21 NOTE — Progress Notes (Signed)
  Date: 03/21/2018  Patient name: Casey Gates  Medical record number: [id number]  Date of birth: 1926-04-08   I have seen and evaluated this patient and I have discussed the plan of care with the house staff. Please see their note for complete details. I concur with their findings with the following additions/corrections:   Slowly improving, but still with persistent O2 requirement after 6 days of zosyn for presumed aspiration pneumonia. Review of CT shows fairly large parapneumonic effusion, will consult IR for possible thoracentesis as may improve his respiratory status given lingering hypoxia and, if complicated, may require chest tube.   Discussed at bedside with surgery attending, he is progressing well and ready for discharge from their standpoint. Just need to resolve his pneumonia and hypoxia first.   Lenice Pressman, M.D., Ph.D. 03/21/2018, 3:53 PM

## 2018-03-21 NOTE — Progress Notes (Signed)
   Subjective: Pt reports feeling well overall, cough has improved still having some productive cough. Minimal abdominal pain mostly positional, does not feel short of breath at rest. Requiring minimal supplemental O2.    Objective:  Vital signs in last 24 hours: Vitals:   03/21/18 0415 03/21/18 0838 03/21/18 0906 03/21/18 1122  BP: (!) 108/58 (!) 107/53 99/60 (!) 98/51  Pulse: 75 71 72 65  Resp: 18 17 17 15   Temp: 98.6 F (37 C) 97.8 F (36.6 C)  98.8 F (37.1 C)  TempSrc: Oral Oral    SpO2: 93% 93% 96% 94%  Weight: 163 lb 2.3 oz (74 kg)      Physical Exam  Constitutional: He is oriented to person, place, and time. No distress.  Resting comfortably in hospital bed  HENT:  Mouth/Throat: Oropharynx is clear and moist.  Eyes: Right eye exhibits no discharge. Left eye exhibits no discharge.  Cardiovascular: Normal rate, regular rhythm and intact distal pulses.  Pulmonary/Chest: Effort normal. No respiratory distress. He has no wheezes. He has rales (LLF).  Decreased breath sounds RLF  Abdominal: Soft. Bowel sounds are normal. He exhibits no distension. There is no tenderness.  Surgical site: Staples intact.  No erythema or drainage.  Musculoskeletal: He exhibits no edema.  Neurological: He is alert and oriented to person, place, and time.  Skin: Skin is warm and dry.   CBC Latest Ref Rng & Units 03/21/2018 03/20/2018 03/18/2018  WBC 4.0 - 10.5 K/uL 10.8(H) 12.6(H) 16.4(H)  Hemoglobin 13.0 - 17.0 g/dL 7.6(L) 7.3(L) 8.4(L)  Hematocrit 39.0 - 52.0 % 24.6(L) 23.7(L) 27.7(L)  Platelets 150 - 400 K/uL 452(H) 379 330   Assessment/Plan:  Principal Problem:   Multifocal pneumonia Active Problems:   Malignant gastrointestinal stromal tumor (GIST) of stomach ([facility])   Sepsis ([facility])  Aspiration pneumonia: Fever 103 on 4/17, WBC up to 16.4 from 12.1 the previous day. CT chest showing nodular infiltrate in right lung suggestive of infection and bibasilar atelectasis vs pneumonia. In  addition, showing a moderate right pleural effusion and small left pleural effusion. Tmax 100 F in the past 48 hrs. White count now down to 12.6. Blood cx drawn 4/18 with NGTD. Currently requiring 3L O2 via Force. Patient received Vancomycin on 4/17. Today is day #7 of Zosyn.   -Continue Zosyn -Supplemental O2 prn -Incentive spirometry  -PT/OT -white count trending down -IR consulted for thoracentesis will perform tomorrow  Malignant gastrointestinal stromal tumor (GIST) of stomach: S/p lap partial gastrectomy on 4/11. POD #8. Hgb 7.3 today. K 3.5. Able to tolerate PO intake. Pain well controlled.   -Surgery following; appreciate recs  -Soft diet  -Will need outpatient oncology follow-up    Chronic pain with chronic constipation: On Neurontin and Opana PRN at home. Patient reports having multiple BMs.   -Cont neurontin 300 mg TID -Morphine IR 30 mg q6 prn -Robaxin 500 mg Q8 hrs prn -Senokot-S qhs prn  Depression/ insomnia -Continue Mirtazapine 7.5 mg qhs -Continue Melatonin 3 mg qhs   Dispo: Anticipated discharge in approximately 2-5 day(s)   Katherine Roan, MD 03/21/2018, 1:12 PM Vickki Muff MD PGY-1 Internal Medicine Pager # [phone]

## 2018-03-21 NOTE — Progress Notes (Signed)
9 Days Post-Op   Subjective/Chief Complaint: Denies SOB Still very weak, deconditioned Tolerating po but appetite poor   Objective: Vital signs in last 24 hours: Temp:  [97.8 F (36.6 C)-99.5 F (37.5 C)] 97.8 F (36.6 C) (04/20 0838) Pulse Rate:  [70-75] 72 (04/20 0906) Resp:  [17-23] 17 (04/20 0906) BP: (99-116)/(53-86) 99/60 (04/20 0906) SpO2:  [90 %-100 %] 96 % (04/20 0906) Weight:  [74 kg (163 lb 2.3 oz)] 74 kg (163 lb 2.3 oz) (04/20 0415) Last BM Date: 03/20/18  Intake/Output from previous day: 04/19 0701 - 04/20 0700 In: 474 [P.O.:474] Out: 351 [Urine:350; Stool:1] Intake/Output this shift: No intake/output data recorded.  Exam: Awake and alert Lungs clear upper Abdomen soft, non-distended, incision clean  Lab Results:  Recent Labs    03/20/18 0325 03/21/18 0408  WBC 12.6* 10.8*  HGB 7.3* 7.6*  HCT 23.7* 24.6*  PLT 379 452*   BMET Recent Labs    03/20/18 0325 03/21/18 0408  NA 137 139  K 3.5 3.9  CL 108 107  CO2 23 25  GLUCOSE 118* 106*  BUN 7 6  CREATININE 0.89 0.91  CALCIUM 7.5* 7.6*   PT/INR Recent Labs    03/18/18 1430  LABPROT 17.3*  INR 1.43   ABG No results for input(s): PHART, HCO3 in the last 72 hours.  Invalid input(s): PCO2, PO2  Studies/Results: No results found.  Anti-infectives: Anti-infectives (From admission, onward)   Start     Dose/Rate Route Frequency Ordered Stop   03/19/18 1400  vancomycin (VANCOCIN) IVPB 1000 mg/200 mL premix  Status:  Discontinued     1,000 mg 200 mL/hr over 60 Minutes Intravenous Every 24 hours 03/18/18 1228 03/19/18 0820   03/19/18 1300  ceFEPIme (MAXIPIME) 1 g in sodium chloride 0.9 % 100 mL IVPB  Status:  Discontinued     1 g 200 mL/hr over 30 Minutes Intravenous Every 24 hours 03/18/18 1228 03/18/18 1311   03/18/18 1230  vancomycin (VANCOCIN) 1,250 mg in sodium chloride 0.9 % 250 mL IVPB     1,250 mg 166.7 mL/hr over 90 Minutes Intravenous  Once 03/18/18 1228 03/18/18 1600   03/18/18 1230  ceFEPIme (MAXIPIME) 2 g in sodium chloride 0.9 % 100 mL IVPB  Status:  Discontinued     2 g 200 mL/hr over 30 Minutes Intravenous  Once 03/18/18 1228 03/18/18 1340   03/15/18 2300  piperacillin-tazobactam (ZOSYN) IVPB 3.375 g     3.375 g 12.5 mL/hr over 240 Minutes Intravenous Every 8 hours 03/15/18 1553     03/15/18 1600  piperacillin-tazobactam (ZOSYN) IVPB 3.375 g     3.375 g 100 mL/hr over 30 Minutes Intravenous  Once 03/15/18 1553 03/15/18 1841   03/12/18 1700  ceFAZolin (ANCEF) IVPB 2g/100 mL premix     2 g 200 mL/hr over 30 Minutes Intravenous Every 8 hours 03/12/18 1605 03/12/18 1907   03/12/18 0644  ceFAZolin (ANCEF) IVPB 2g/100 mL premix     2 g 200 mL/hr over 30 Minutes Intravenous On call to O.R. 03/12/18 0254 03/12/18 0933      Assessment/Plan: s/p Procedure(s): LAPAROSCOPIC ASSISTED PARTIAL GASTRECTOMY ERAS PATHWAY (N/A)  Pneumonia   Clinically improving.  Discussed with Medical team who are planning a thoracentesis given pleural effusion.  Given deconditioning, plan eventual SNF  LOS: 9 days    Tanea Moga A 03/21/2018

## 2018-03-22 ENCOUNTER — Inpatient Hospital Stay (HOSPITAL_COMMUNITY): Payer: Medicare Other

## 2018-03-22 LAB — GRAM STAIN

## 2018-03-22 LAB — BASIC METABOLIC PANEL
ANION GAP: 8 (ref 5–15)
BUN: 6 mg/dL (ref 6–20)
CALCIUM: 7.8 mg/dL — AB (ref 8.9–10.3)
CO2: 25 mmol/L (ref 22–32)
Chloride: 105 mmol/L (ref 101–111)
Creatinine, Ser: 0.9 mg/dL (ref 0.61–1.24)
GFR calc Af Amer: >60 mL/min (ref >60)
GFR calc non Af Amer: >60 mL/min (ref >60)
GLUCOSE: 110 mg/dL — AB (ref 65–99)
Potassium: 3.7 mmol/L (ref 3.5–5.1)
Sodium: 138 mmol/L (ref 135–145)

## 2018-03-22 LAB — BODY FLUID CELL COUNT WITH DIFFERENTIAL
Eos, Fluid: 5 %
Lymphs, Fluid: 40 %
Monocyte-Macrophage-Serous Fluid: 27 % — ABNORMAL LOW (ref 50–90)
Neutrophil Count, Fluid: 28 % — ABNORMAL HIGH (ref 0–25)
Total Nucleated Cell Count, Fluid: 280 cu mm (ref 0–1000)

## 2018-03-22 LAB — GLUCOSE, PLEURAL OR PERITONEAL FLUID: Glucose, Fluid: 104 mg/dL

## 2018-03-22 LAB — CBC
HEMATOCRIT: 26.6 % — AB (ref 39.0–52.0)
Hemoglobin: 8.1 g/dL — ABNORMAL LOW (ref 13.0–17.0)
MCH: 26.2 pg (ref 26.0–34.0)
MCHC: 30.5 g/dL (ref 30.0–36.0)
MCV: 86.1 fL (ref 78.0–100.0)
Platelets: 488 10*3/uL — ABNORMAL HIGH (ref 150–400)
RBC: 3.09 MIL/uL — ABNORMAL LOW (ref 4.22–5.81)
RDW: 16 % — AB (ref 11.5–15.5)
WBC: 11.2 10*3/uL — AB (ref 4.0–10.5)

## 2018-03-22 LAB — C DIFFICILE QUICK SCREEN W PCR REFLEX
C Diff antigen: NEGATIVE
C Diff interpretation: NOT DETECTED
C Diff toxin: NEGATIVE

## 2018-03-22 LAB — LACTATE DEHYDROGENASE, PLEURAL OR PERITONEAL FLUID: LD, Fluid: 109 U/L — ABNORMAL HIGH (ref 3–23)

## 2018-03-22 LAB — PROTEIN, PLEURAL OR PERITONEAL FLUID: Total protein, fluid: <3 g/dL

## 2018-03-22 MED ORDER — SODIUM CHLORIDE 0.9 % IV SOLN
3.0000 g | Freq: Three times a day (TID) | INTRAVENOUS | Status: DC
  Administered 2018-03-22 – 2018-03-24 (×7): 3 g via INTRAVENOUS
  Filled 2018-03-22 (×8): qty 3

## 2018-03-22 MED ORDER — LOPERAMIDE HCL 2 MG PO CAPS
2.0000 mg | ORAL_CAPSULE | ORAL | Status: DC | PRN
  Administered 2018-03-22 – 2018-03-23 (×3): 2 mg via ORAL
  Filled 2018-03-22 (×3): qty 1

## 2018-03-22 MED ORDER — LIDOCAINE HCL (PF) 1 % IJ SOLN
INTRAMUSCULAR | Status: AC
  Filled 2018-03-22: qty 30

## 2018-03-22 NOTE — Progress Notes (Signed)
   Subjective: Evaluated patient this morning after thoracentesis.  Patient denies any issues overnight.   He reports some mild coughing after the thoracentesis but no complications.  Denies shortness of breath.   Objective:  Vital signs in last 24 hours: Vitals:   03/21/18 1440 03/21/18 1935 03/21/18 2339 03/22/18 0352  BP: (!) 108/59 (!) 106/56 115/62 120/61  Pulse: 71 70 72 73  Resp: 19 (!) 22 (!) 25 (!) 21  Temp: 99.7 F (37.6 C) 98.9 F (37.2 C) 99.2 F (37.3 C) 98.4 F (36.9 C)  TempSrc: Oral Oral Oral Oral  SpO2: 94% 95% 94% 92%  Weight:    162 lb 11.2 oz (73.8 kg)   Physical Exam  Constitutional: He is well-developed, well-nourished, and in no distress.  Cardiovascular: Normal rate, regular rhythm and normal heart sounds. Exam reveals no gallop and no friction rub.  No murmur heard. Pulmonary/Chest: Effort normal and breath sounds normal. No respiratory distress. He has no wheezes. He has no rales.     Assessment/Plan:  Principal Problem:   Multifocal pneumonia Active Problems:   Malignant gastrointestinal stromal tumor (GIST) of stomach (HCC)   Sepsis ([place])  Aspiration pneumonia During admission patient developed aspiration pneumonia. Patient has been treated with Zosyn (day 8).  He has a new oxygen requirement due to the pneumonia of 1-3 L via nasal cannula. Trying to weaning to room air.  He has remained afebrile for the past 72 hours and leukocytosis improving.  He had a CT chest on 4/17 that showed a moderate parapneumonic effusion and IR has been consulted for thoracentesis.   -switching zosyn to unasyn  - appreciate IR with Thoracentesis  Malignant gastrointestinal stromal tumor (GIST) of stomach: S/p lap partial gastrectomy on 4/11. POD #9. Hgb 8.1 today. K 3.7. Able to tolerate PO intake. Pain well controlled.  -Surgery following -Soft diet  -Will need outpatient oncology follow-up   Chronic pain On Neurontin and Opana PRN at home -Cont neurontin 300  mg TID -Morphine IR 30 mg q6 prn -Robaxin 500 mg Q8 hrs prn -Senokot-S qhs prn  Depression/ insomnia -Continue Mirtazapine 7.5 mg qhs -Continue Melatonin 3 mg qhs   Dispo: Anticipated discharge pending thoracentesis and continued clinical improvement.   Kalman Shan Olanta, DO 03/22/2018, 7:52 AM Pager: [phone]

## 2018-03-22 NOTE — Procedures (Signed)
PROCEDURE SUMMARY:  Successful US guided right thoracentesis. Yielded 780 mL of clear, amber colored fluid. Pt tolerated procedure well. No immediate complications.  Specimen was sent for labs. CXR ordered.  Ascencion Dike PA-C 03/22/2018 9:25 AM

## 2018-03-22 NOTE — Progress Notes (Signed)
Pharmacy Antibiotic Note  Casey Gates is a 84 y.o. male admitted on 03/12/2018 for planned surgery subsequently developing aspiration pneumonia .  Pharmacy has been consulted for unasyn dosing.  Pt received 7 days of tx with zosyn, requiring O2, afeb, improving WBC, IR now consulted for thoracentesis of parapneumonic effusion.  Renal function stable, SCr 0.9 - CrCl ~55 ml/min.  Plan: Unasyn 3g IV q8h F/u LOT and IR recommendations Monitor renal function   Weight: 162 lb 11.2 oz (73.8 kg)  Temp (24hrs), Avg:99 F (37.2 C), Min:98.4 F (36.9 C), Max:99.7 F (37.6 C)  Recent Labs  Lab 03/17/18 0436 03/18/18 0854 03/18/18 1149 03/19/18 0451 03/20/18 0325 03/21/18 0408 03/22/18 0450  WBC 12.1* 16.4*  --   --  12.6* 10.8* 11.2*  CREATININE  --  1.04  --  1.08 0.89 0.91 0.90  LATICACIDVEN  --  0.7 0.8  --   --   --   --     Estimated Creatinine Clearance: 55.6 mL/min (by C-G formula based on SCr of 0.9 mg/dL).    Allergies  Allergen Reactions  . Coconut Flavor [Flavoring Agent] Hypertension    Anything that is related to coconut---LOSS OF CONSCIOUSNESS (SYNCOPE)  . Coconut Oil Other (See Comments)    Anything that is related to coconut--- LOSS OF CONSCIOUSNESS (SYNCOPE)  . Food Anaphylaxis    ALLERGIC TO ALL NUTS WITH THE EXCEPTION OF CASHEWS, ALMONDS, AND PEANUTS.    Antimicrobials this admission: Zosyn 4/14 >> 4/21 Vanc 4/17 >>4/18 , 1 dose given Cefepime 4/17>4/17 1 dose given   Microbiology results: 4/17 BCx - ngtd 4/17 UCx - negative 4/17 MRSA PCR: negative   Bertis Ruddy, PharmD Pharmacy Resident Pager #: [phone] 03/22/2018 1:50 PM

## 2018-03-22 NOTE — Evaluation (Signed)
Physical Therapy Re-Evaluation Patient Details Name: Casey Gates MRN: [id number] DOB: [birth year] Today's Date: 03/22/2018   History of Present Illness  Pt is an 84 y.o. male s/p LAPAROSCOPIC ASSISTED PARTIAL GASTRECTOMY ERAS PATHWAY with recovery complicated by Respiratory Distress. PMHx: Blind L eye, Prostate cancer, Cellulitis, CKD, Chronic pain syndrome, DDD, GI bleed, Kyphoscoliosis, Gastrointestinal stromal tumor.  Clinical Impression   Patient is s/p above surgery resulting in functional limitations due to the deficits listed below (see PT Problem List). Presents more weak and fatigued than before transfer to stepdown; supplemental O2 2 L; Continue to recommend sNF for post-acute rehab;  Patient will benefit from skilled PT to increase their independence and safety with mobility to allow discharge to the venue listed below.       Follow Up Recommendations SNF    Equipment Recommendations  None recommended by PT    Recommendations for Other Services       Precautions / Restrictions Precautions Precautions: Fall Restrictions Weight Bearing Restrictions: No      Mobility  Bed Mobility Overal bed mobility: Needs Assistance Bed Mobility: Rolling;Sidelying to Sit Rolling: Min guard Sidelying to sit: Mod assist       General bed mobility comments: Cues for logroll and sidelie to sit; light mod assist to push up to sit  Transfers Overall transfer level: Needs assistance Equipment used: Rolling walker (2 wheeled) Transfers: Sit to/from Stand Sit to Stand: Mod assist Stand pivot transfers: Mod assist       General transfer comment: Mod assist to power up to stand; used RW; Light mod assist to steady during pivotal steps bed to recliner  Ambulation/Gait             General Gait Details: too fatigued today  Stairs            Wheelchair Mobility    Modified Rankin (Stroke Patients Only)       Balance     Sitting balance-Leahy Scale: Fair        Standing balance-Leahy Scale: Poor                               Pertinent Vitals/Pain Pain Assessment: Faces Faces Pain Scale: Hurts little more Pain Location: abdomen Pain Descriptors / Indicators: Aching Pain Intervention(s): Monitored during session    Home Living Family/patient expects to be discharged to:: Skilled nursing facility Living Arrangements: Alone Available Help at Discharge: Family;Available PRN/intermittently Type of Home: Independent living facility Home Access: Level entry     Home Layout: One level Home Equipment: Walker - 2 wheels;Electric scooter;Shower seat - built in;Grab bars - tub/shower;Hand held shower head Additional Comments: Pt reports he lives at [facility]    Prior Function Level of Independence: Needs assistance   Gait / Transfers Assistance Needed: RW for household distances, uses scooter for mobility to dining room for meals  ADL's / Homemaking Assistance Needed: Pt reports mod I with BADL.        Hand Dominance   Dominant Hand: Right    Extremity/Trunk Assessment   Upper Extremity Assessment Upper Extremity Assessment: Defer to OT evaluation    Lower Extremity Assessment Lower Extremity Assessment: Generalized weakness    Cervical / Trunk Assessment Cervical / Trunk Assessment: Kyphotic Cervical / Trunk Exceptions: chronic flexion due to pain  Communication   Communication: No difficulties  Cognition Arousal/Alertness: Awake/alert Behavior During Therapy: WFL for tasks assessed/performed Overall Cognitive Status: No family/caregiver present to  determine baseline cognitive functioning                                        General Comments      Exercises     Assessment/Plan    PT Assessment Patient needs continued PT services  PT Problem List Decreased strength;Decreased activity tolerance;Decreased balance;Decreased mobility       PT Treatment Interventions Gait  training;Patient/family education;Functional mobility training;Therapeutic activities;Therapeutic exercise    PT Goals (Current goals can be found in the Care Plan section)  Acute Rehab PT Goals Patient Stated Goal: decrease pain PT Goal Formulation: With patient Time For Goal Achievement: 04/05/18(goals set 4.14 continue to be appropriate) Potential to Achieve Goals: Fair    Frequency Min 3X/week   Barriers to discharge Decreased caregiver support      Co-evaluation               AM-PAC PT "6 Clicks" Daily Activity  Outcome Measure Difficulty turning over in bed (including adjusting bedclothes, sheets and blankets)?: A Lot Difficulty moving from lying on back to sitting on the side of the bed? : A Lot Difficulty sitting down on and standing up from a chair with arms (e.g., wheelchair, bedside commode, etc,.)?: Unable Help needed moving to and from a bed to chair (including a wheelchair)?: A Lot Help needed walking in hospital room?: A Lot Help needed climbing 3-5 steps with a railing? : A Lot 6 Click Score: 11    End of Session Equipment Utilized During Treatment: Gait belt Activity Tolerance: Patient limited by fatigue Patient left: in chair;with call bell/phone within reach;with nursing/sitter in room Nurse Communication: Mobility status PT Visit Diagnosis: Unsteadiness on feet (R26.81);Other abnormalities of gait and mobility (R26.89);Pain Pain - part of body: (abdomen)    Time: 6720-9470 PT Time Calculation (min) (ACUTE ONLY): 21 min   Charges:   PT Evaluation $PT Re-evaluation: 1 Re-eval     PT G Codes:        Roney Marion, PT  Acute Rehabilitation Services Pager [phone] Office [phone]   Colletta [place] 03/22/2018, 4:22 PM

## 2018-03-23 DIAGNOSIS — R197 Diarrhea, unspecified: Secondary | ICD-10-CM

## 2018-03-23 DIAGNOSIS — Z85028 Personal history of other malignant neoplasm of stomach: Secondary | ICD-10-CM

## 2018-03-23 DIAGNOSIS — J90 Pleural effusion, not elsewhere classified: Secondary | ICD-10-CM

## 2018-03-23 DIAGNOSIS — Z903 Acquired absence of stomach [part of]: Secondary | ICD-10-CM

## 2018-03-23 LAB — BASIC METABOLIC PANEL
ANION GAP: 14 (ref 5–15)
Anion gap: 7 (ref 5–15)
BUN: 7 mg/dL (ref 6–20)
BUN: 8 mg/dL (ref 6–20)
CHLORIDE: 111 mmol/L (ref 101–111)
CHLORIDE: 108 mmol/L (ref 101–111)
CO2: 22 mmol/L (ref 22–32)
CO2: 24 mmol/L (ref 22–32)
CREATININE: 0.87 mg/dL (ref 0.61–1.24)
Calcium: 6.5 mg/dL — ABNORMAL LOW (ref 8.9–10.3)
Calcium: 7.8 mg/dL — ABNORMAL LOW (ref 8.9–10.3)
Creatinine, Ser: 1.78 mg/dL — ABNORMAL HIGH (ref 0.61–1.24)
GFR calc Af Amer: 37 mL/min — ABNORMAL LOW (ref >60)
GFR calc Af Amer: >60 mL/min (ref >60)
GFR calc non Af Amer: 32 mL/min — ABNORMAL LOW (ref >60)
GFR calc non Af Amer: >60 mL/min (ref >60)
GLUCOSE: 81 mg/dL (ref 65–99)
GLUCOSE: 115 mg/dL — AB (ref 65–99)
POTASSIUM: 3.6 mmol/L (ref 3.5–5.1)
Potassium: 2.9 mmol/L — ABNORMAL LOW (ref 3.5–5.1)
Sodium: 147 mmol/L — ABNORMAL HIGH (ref 135–145)
Sodium: 139 mmol/L (ref 135–145)

## 2018-03-23 LAB — CULTURE, BLOOD (ROUTINE X 2)
CULTURE: NO GROWTH
Culture: NO GROWTH
Special Requests: ADEQUATE
Special Requests: ADEQUATE

## 2018-03-23 LAB — CBC
HCT: 26.5 % — ABNORMAL LOW (ref 39.0–52.0)
HEMATOCRIT: 21.1 % — AB (ref 39.0–52.0)
HEMOGLOBIN: 6.6 g/dL — AB (ref 13.0–17.0)
Hemoglobin: 8.2 g/dL — ABNORMAL LOW (ref 13.0–17.0)
MCH: 26.4 pg (ref 26.0–34.0)
MCH: 26.3 pg (ref 26.0–34.0)
MCHC: 31.3 g/dL (ref 30.0–36.0)
MCHC: 30.9 g/dL (ref 30.0–36.0)
MCV: 84.4 fL (ref 78.0–100.0)
MCV: 84.9 fL (ref 78.0–100.0)
Platelets: 375 10*3/uL (ref 150–400)
Platelets: 496 10*3/uL — ABNORMAL HIGH (ref 150–400)
RBC: 2.5 MIL/uL — ABNORMAL LOW (ref 4.22–5.81)
RBC: 3.12 MIL/uL — ABNORMAL LOW (ref 4.22–5.81)
RDW: 16.3 % — AB (ref 11.5–15.5)
RDW: 16.3 % — ABNORMAL HIGH (ref 11.5–15.5)
WBC: 9.7 10*3/uL (ref 4.0–10.5)
WBC: 11.7 10*3/uL — ABNORMAL HIGH (ref 4.0–10.5)

## 2018-03-23 LAB — LACTATE DEHYDROGENASE: LDH: 109 U/L (ref 98–192)

## 2018-03-23 LAB — PH, BODY FLUID: pH, Body Fluid: 7.6

## 2018-03-23 LAB — PREPARE RBC (CROSSMATCH)

## 2018-03-23 MED ORDER — SODIUM CHLORIDE 0.9 % IV BOLUS
500.0000 mL | Freq: Once | INTRAVENOUS | Status: AC
  Administered 2018-03-23: 500 mL via INTRAVENOUS

## 2018-03-23 MED ORDER — ALBUMIN HUMAN 5 % IV SOLN: 25.0000 g | Freq: Once | INTRAVENOUS | Status: DC

## 2018-03-23 MED ORDER — POTASSIUM CHLORIDE IN NACL 40-0.9 MEQ/L-% IV SOLN
INTRAVENOUS | Status: DC
Start: 2018-03-23 — End: 2018-03-23
  Filled 2018-03-23: qty 1000

## 2018-03-23 MED ORDER — SODIUM CHLORIDE 0.9 % IV SOLN: Freq: Once | INTRAVENOUS | Status: DC

## 2018-03-23 MED ORDER — SODIUM CHLORIDE 0.9 % IV SOLN: INTRAVENOUS | Status: DC

## 2018-03-23 MED ORDER — POTASSIUM CHLORIDE IN NACL 40-0.9 MEQ/L-% IV SOLN
INTRAVENOUS | Status: DC
  Filled 2018-03-23: qty 1000

## 2018-03-23 MED ORDER — GUAIFENESIN 100 MG/5ML PO SOLN
5.0000 mL | ORAL | Status: DC | PRN
Start: 2018-03-23 — End: 2018-03-24
  Administered 2018-03-23 (×2): 100 mg via ORAL
  Filled 2018-03-23: qty 5
  Filled 2018-03-23: qty 25

## 2018-03-23 NOTE — Progress Notes (Signed)
ANTIBIOTIC CONSULT NOTE   Pharmacy Consult for Unasyn Indication: pneumonia  Allergies  Allergen Reactions  . Coconut Flavor [Flavoring Agent] Hypertension    Anything that is related to coconut---LOSS OF CONSCIOUSNESS (SYNCOPE)  . Coconut Oil Other (See Comments)    Anything that is related to coconut--- LOSS OF CONSCIOUSNESS (SYNCOPE)  . Food Anaphylaxis    ALLERGIC TO ALL NUTS WITH THE EXCEPTION OF CASHEWS, ALMONDS, AND PEANUTS.    Patient Measurements: Weight: 157 lb 10.1 oz (71.5 kg) Adjusted Body Weight:    Vital Signs: Temp: 98 F (36.7 C) (04/22 1125) Temp Source: Oral (04/22 1125) BP: 113/61 (04/22 0820) Pulse Rate: 71 (04/22 1125) Intake/Output from previous day: 04/21 0701 - 04/22 0700 In: 660 [P.O.:360; IV Piggyback:300] Out: 1150 [Urine:1150] Intake/Output from this shift: Total I/O In: 480 [P.O.:480] Out: -   Labs: Recent Labs    03/22/18 0450 03/23/18 0532 03/23/18 0756 03/23/18 1032  WBC 11.2* 9.7 11.7*  --   HGB 8.1* 6.6* 8.2*  --   PLT 488* 375 496*  --   CREATININE 0.90 1.78*  --  0.87   Estimated Creatinine Clearance: 57.6 mL/min (by C-G formula based on SCr of 0.87 mg/dL). No results for input(s): VANCOTROUGH, VANCOPEAK, VANCORANDOM, GENTTROUGH, GENTPEAK, GENTRANDOM, TOBRATROUGH, TOBRAPEAK, TOBRARND, AMIKACINPEAK, AMIKACINTROU, AMIKACIN in the last 72 hours.   Microbiology:   Medical History: Past Medical History:  Diagnosis Date  . Anemia   . Asthma    AS A CHILD  . Blind left eye   . Cancer [facility])    PROSTATE CANCER  . Cellulitis    frequently, "all over"  . Chronic kidney disease   . Chronic pain syndrome   . Constipation   . DDD (degenerative disc disease), lumbar   . Gastrointestinal stromal tumor (GIST) of stomach ([place])   . GI bleeding   . History of blood transfusion    01/2018  . Insomnia   . Kyphoscoliosis   . Mass in the abdomen 01/2018  . Restless leg   . Sleep apnea    does not wear CPAP  . Wears glasses      Assessment: ID: presumed asp PNA - afeb, Afebrile, WBC 11.7, LA 0.8 CT C/A P showed multifocal pneumonia and no evidence of issues in the abdomen. Scr 0.87 stable. Path with GIST.  outpt referral to oncology.  Unasyn 4/21> Zosyn 4/14 >> 4/21 Vanc 4/17 >>4/18 , 1 dose given Cefepime 4/17>4/17 1 dose given  4/17 BCx - Negative 4/17 UCx - negative 4/17 MRSA PCR: negative  4/21: Cdiff: negative 4/21 Pleural fluid>>   Goal of Therapy:  Eradication of infection  Plan:  Unasyn 3g IV q 8 hrs Pharmacy will sign off. Please reconsult for further dosing assitance.   Remmi Armenteros S. Alford Highland, PharmD, [place] Clinical Staff Pharmacist Pager [phone]  Eilene Ghazi Stillinger 03/23/2018,1:21 PM

## 2018-03-23 NOTE — Progress Notes (Signed)
CSW confirmed patient's bed with [facility] today. SNF also starting patient's [facility] authorization; awaiting determination. CSW to support with discharge when medically ready.  Estanislado Emms, [place]

## 2018-03-23 NOTE — Progress Notes (Signed)
Internal Medicine Attending:   I saw and examined the patient. I reviewed the resident's note and I agree with the resident's findings and plan as documented in the resident's note.  Patient states that he feels well and has no new complaints today.  Patient was initially admitted for likely aspiration pneumonia status post lap partial gastrectomy on April 11.  Leukocytosis slowly resolving.  We will continue with Unasyn for now and transition to Augmentin when patient ready for discharge.  Patient's initial labs this morning showed worsening anemia as well as an AKI but repeat labs showed this was likely an error and creatinine and hemoglobin were at baseline.  We will continue to monitor closely.  Patient will need SNF on discharge.

## 2018-03-23 NOTE — Evaluation (Addendum)
Occupational Therapy Re-Evaluation Patient Details Name: Casey Gates MRN: [id number] DOB: 1926-04-17 Today's Date: 03/23/2018    History of Present Illness Pt is an 84 y.o. male s/p LAPAROSCOPIC ASSISTED PARTIAL GASTRECTOMY ERAS PATHWAY with recovery complicated by Respiratory Distress. PMHx: Blind L eye, Prostate cancer, Cellulitis, CKD, Chronic pain syndrome, DDD, GI bleed, Kyphoscoliosis, Gastrointestinal stromal tumor.   Clinical Impression   PTA, pt was living in independent living facility and reports completing basic ADL with modified independence using RW. He does use a motorized scooter to get to dining room for meals. Pt currently limited by decreased activity tolerance for ADL and generalized weakness. He presents with some decrease in functional participation in ADL as compared to initial OT evaluation prior to transfer to step-down unit. He requires total assist for toileting hygiene, max assist for LB ADL, and min assist for UB ADL. He is greatly limited by poor activity tolerance and was only able to tolerate standing for 10 seconds at edge of chair this session. Pt would benefit from continued OT services while admitted and recommend continued rehabilitation at SNF level post-acute D/C.     Follow Up Recommendations  SNF;Supervision/Assistance - 24 hour    Equipment Recommendations  None recommended by OT    Recommendations for Other Services       Precautions / Restrictions Precautions Precautions: Fall Restrictions Weight Bearing Restrictions: No      Mobility Bed Mobility               General bed mobility comments: OOB in recliner on my arrival.  Transfers Overall transfer level: Needs assistance Equipment used: Rolling walker (2 wheeled) Transfers: Sit to/from Stand Sit to Stand: Mod assist         General transfer comment: Mod assist to power up to standing. Able to maintain standing with RW for 10 seconds but unable to fully extend bilateral  knees. Unable to progress with ADL transfers due to pt needing to sit.     Balance Overall balance assessment: Needs assistance Sitting-balance support: Feet supported;Bilateral upper extremity supported Sitting balance-Leahy Scale: Fair     Standing balance support: Bilateral upper extremity supported;During functional activity Standing balance-Leahy Scale: Poor Standing balance comment: Requiring BUE support and only able to maintain standing for 10 seconds with assistance.                            ADL either performed or assessed with clinical judgement   ADL Overall ADL's : Needs assistance/impaired Eating/Feeding: Set up;Sitting   Grooming: Set up;Sitting   Upper Body Bathing: Minimal assistance;Sitting   Lower Body Bathing: Maximal assistance;Sit to/from stand   Upper Body Dressing : Minimal assistance;Sitting   Lower Body Dressing: Maximal assistance;Sit to/from Health and safety inspector Details (indicate cue type and reason): Only able to complete sit<>stand and maintain standing for 10 seconds this session. Unable to fully extend knees or maintain standing to achieve ADL transfer this session.  Toileting- Clothing Manipulation and Hygiene: Total assistance;Sit to/from stand         General ADL Comments: Pt significantly limited in participation in ADL due to decreased activity tolerance and generalized weakness.      Vision Baseline Vision/History: Wears glasses(blind in L eye) Wears Glasses: At all times Patient Visual Report: No change from baseline Additional Comments: Pt with blindness in L eye. Able to track well and visually identify objects during session.      Perception  Praxis      Pertinent Vitals/Pain Pain Assessment: Faces Faces Pain Scale: Hurts a little bit Pain Location: abdomen when standing Pain Descriptors / Indicators: Aching Pain Intervention(s): Monitored during session;Limited activity within patient's  tolerance;Repositioned     Hand Dominance Right   Extremity/Trunk Assessment Upper Extremity Assessment Upper Extremity Assessment: Generalized weakness   Lower Extremity Assessment Lower Extremity Assessment: Generalized weakness(unable to achieve knee extension in standing)       Communication Communication Communication: No difficulties   Cognition Arousal/Alertness: Awake/alert Behavior During Therapy: WFL for tasks assessed/performed Overall Cognitive Status: No family/caregiver present to determine baseline cognitive functioning Area of Impairment: Safety/judgement;Awareness                         Safety/Judgement: Decreased awareness of safety Awareness: Emergent   General Comments: Pt requiring reinforcement of education concerning safety but does report that he does not think he will be able to stand. Emergent awareness noted approaching anticipatory. Pt able to report that he will need rehabilitation prior to returning to ILF.    General Comments  Pt fearful of falling but motivated to participate with encouragement.     Exercises     Shoulder Instructions      Home Living Family/patient expects to be discharged to:: Skilled nursing facility Living Arrangements: Alone Available Help at Discharge: Family;Available PRN/intermittently Type of Home: Independent living facility Home Access: Level entry     Home Layout: One level     Bathroom Shower/Tub: Occupational psychologist: Handicapped height     Home Equipment: Environmental consultant - 2 wheels;Electric scooter;Shower seat - built in;Grab bars - tub/shower;Hand held shower head   Additional Comments: Pt living at [facility].       Prior Functioning/Environment Level of Independence: Needs assistance  Gait / Transfers Assistance Needed: Uses RW for household distances but uses motorized scooter to go to dining room.  ADL's / Homemaking Assistance Needed: Pt reports able to complete  basic ADL with RW            OT Problem List: Decreased strength;Decreased range of motion;Decreased activity tolerance;Impaired balance (sitting and/or standing);Decreased cognition;Decreased safety awareness;Decreased knowledge of use of DME or AE;Decreased knowledge of precautions;Pain      OT Treatment/Interventions: Self-care/ADL training;Therapeutic exercise;Energy conservation;DME and/or AE instruction;Therapeutic activities;Patient/family education;Balance training    OT Goals(Current goals can be found in the care plan section) Acute Rehab OT Goals Patient Stated Goal: decrease pain OT Goal Formulation: With patient Time For Goal Achievement: 03/29/18 Potential to Achieve Goals: Good ADL Goals Pt Will Perform Grooming: with min assist;standing Pt Will Perform Upper Body Bathing: with min guard assist;sitting Pt Will Perform Lower Body Bathing: with min assist;sit to/from stand Pt Will Transfer to Toilet: with min assist;stand pivot transfer;bedside commode Pt Will Perform Toileting - Clothing Manipulation and hygiene: with min assist;sit to/from stand Pt/caregiver will Perform Home Exercise Program: Increased strength;Both right and left upper extremity;With written HEP provided;With Supervision  OT Frequency: Min 2X/week   Barriers to D/C:            Co-evaluation              AM-PAC PT "6 Clicks" Daily Activity     Outcome Measure Help from another person eating meals?: A Little Help from another person taking care of personal grooming?: A Little Help from another person toileting, which includes using toliet, bedpan, or urinal?: A Lot Help from another person  bathing (including washing, rinsing, drying)?: A Lot Help from another person to put on and taking off regular upper body clothing?: A Little Help from another person to put on and taking off regular lower body clothing?: A Lot 6 Click Score: 15   End of Session Equipment Utilized During Treatment:  Rolling walker  Activity Tolerance: Patient tolerated treatment well Patient left: in chair;with call bell/phone within reach  OT Visit Diagnosis: Unsteadiness on feet (R26.81);Other abnormalities of gait and mobility (R26.89);Muscle weakness (generalized) (M62.81);Pain Pain - part of body: (abdomen)                Time: 0981-1914 OT Time Calculation (min): 20 min Charges:  OT General Charges $OT Visit: 1 Visit OT Evaluation $OT Eval Moderate Complexity: 1 Mod G-Codes:     Norman Herrlich, MS OTR/L  Pager: [place] A Phyllip Claw 03/23/2018, 5:22 PM

## 2018-03-23 NOTE — Progress Notes (Signed)
11 Days Post-Op   Subjective/Chief Complaint: SOB improved with thoracentesis. No n/v.    Objective: Vital signs in last 24 hours: Temp:  [97.6 F (36.4 C)-97.9 F (36.6 C)] 97.9 F (36.6 C) (04/22 0851) Pulse Rate:  [70-79] 70 (04/22 0851) Resp:  [13-23] 19 (04/22 0851) BP: (105-113)/(54-60) 105/60 (04/22 0349) SpO2:  [93 %-97 %] 97 % (04/22 0851) Weight:  [71.5 kg (157 lb 10.1 oz)] 71.5 kg (157 lb 10.1 oz) (04/22 0349) Last BM Date: 03/23/18  Intake/Output from previous day: 04/21 0701 - 04/22 0700 In: 660 [P.O.:360; IV Piggyback:300] Out: 1150 [Urine:1150] Intake/Output this shift: Total I/O In: 240 [P.O.:240] Out: -   Exam: Awake and alert Continues to cough up significant secretions. Abdomen soft, non-distended, incision clean  Lab Results:  Recent Labs    03/23/18 0532 03/23/18 0756  WBC 9.7 11.7*  HGB 6.6* 8.2*  HCT 21.1* 26.5*  PLT 375 496*   BMET Recent Labs    03/22/18 0450 03/23/18 0532  NA 138 147*  K 3.7 2.9*  CL 105 111  CO2 25 22  GLUCOSE 110* 81  BUN 6 7  CREATININE 0.90 1.78*  CALCIUM 7.8* 6.5*   PT/INR No results for input(s): LABPROT, INR in the last 72 hours. ABG No results for input(s): PHART, HCO3 in the last 72 hours.  Invalid input(s): PCO2, PO2  Studies/Results: Dg Chest 1 View  Result Date: 03/22/2018 CLINICAL DATA:  Status post right thoracentesis today. EXAM: CHEST  1 VIEW COMPARISON:  CT chest and single view of the chest 03/18/2018. FINDINGS: Right pleural effusion is decreased after thoracentesis. No pneumothorax. Patchy airspace disease throughout the right chest persists but is improved. Left basilar airspace disease is unchanged. Heart size is upper normal. IMPRESSION: Negative for pneumothorax after right thoracentesis. Patchy airspace disease throughout the right chest persists but is improved. Left basilar airspace disease is unchanged. Electronically Signed   By: Inge Rise M.D.   On: 03/22/2018 09:40   US  Thoracentesis Asp Pleural Space W/img Guide  Result Date: 03/22/2018 INDICATION: Shortness of breath. Right-sided pleural effusion. Request diagnostic and therapeutic thoracentesis. EXAM: ULTRASOUND GUIDED RIGHT THORACENTESIS MEDICATIONS: None. COMPLICATIONS: None immediate. PROCEDURE: An ultrasound guided thoracentesis was thoroughly discussed with the patient and questions answered. The benefits, risks, alternatives and complications were also discussed. The patient understands and wishes to proceed with the procedure. Written consent was obtained. Ultrasound was performed to localize and mark an adequate pocket of fluid in the right chest. The area was then prepped and draped in the normal sterile fashion. 1% Lidocaine was used for local anesthesia. Under ultrasound guidance a Safe-T-Centesis catheter was introduced. Thoracentesis was performed. The catheter was removed and a dressing applied. FINDINGS: A total of approximately 780 mL of clear, amber colored fluid was removed. Samples were sent to the laboratory as requested by the clinical team. IMPRESSION: Successful ultrasound guided right thoracentesis yielding 780 mL of pleural fluid. Read by: Ascencion Dike PA-C Electronically Signed   By: Jacqulynn Cadet M.D.   On: 03/22/2018 09:43    Anti-infectives: Anti-infectives (From admission, onward)   Start     Dose/Rate Route Frequency Ordered Stop   03/22/18 1400  Ampicillin-Sulbactam (UNASYN) 3 g in sodium chloride 0.9 % 100 mL IVPB     3 g 200 mL/hr over 30 Minutes Intravenous Every 8 hours 03/22/18 1345     03/19/18 1400  vancomycin (VANCOCIN) IVPB 1000 mg/200 mL premix  Status:  Discontinued     1,000 mg  200 mL/hr over 60 Minutes Intravenous Every 24 hours 03/18/18 1228 03/19/18 0820   03/19/18 1300  ceFEPIme (MAXIPIME) 1 g in sodium chloride 0.9 % 100 mL IVPB  Status:  Discontinued     1 g 200 mL/hr over 30 Minutes Intravenous Every 24 hours 03/18/18 1228 03/18/18 1311   03/18/18 1230   vancomycin (VANCOCIN) 1,250 mg in sodium chloride 0.9 % 250 mL IVPB     1,250 mg 166.7 mL/hr over 90 Minutes Intravenous  Once 03/18/18 1228 03/18/18 1600   03/18/18 1230  ceFEPIme (MAXIPIME) 2 g in sodium chloride 0.9 % 100 mL IVPB  Status:  Discontinued     2 g 200 mL/hr over 30 Minutes Intravenous  Once 03/18/18 1228 03/18/18 1340   03/15/18 2300  piperacillin-tazobactam (ZOSYN) IVPB 3.375 g  Status:  Discontinued     3.375 g 12.5 mL/hr over 240 Minutes Intravenous Every 8 hours 03/15/18 1553 03/22/18 1336   03/15/18 1600  piperacillin-tazobactam (ZOSYN) IVPB 3.375 g     3.375 g 100 mL/hr over 30 Minutes Intravenous  Once 03/15/18 1553 03/15/18 1841   03/12/18 1700  ceFAZolin (ANCEF) IVPB 2g/100 mL premix     2 g 200 mL/hr over 30 Minutes Intravenous Every 8 hours 03/12/18 1605 03/12/18 1907   03/12/18 0644  ceFAZolin (ANCEF) IVPB 2g/100 mL premix     2 g 200 mL/hr over 30 Minutes Intravenous On call to O.R. 03/12/18 3559 03/12/18 0933      Assessment/Plan: s/p Procedure(s): LAPAROSCOPIC ASSISTED PARTIAL GASTRECTOMY ERAS PATHWAY (N/A)  Pneumonia  ABL anemia on CBL anemia - transfuse AKI - transfusion should help.  Await stability to return to SNF.   LOS: 11 days    Stark Klein 03/23/2018

## 2018-03-23 NOTE — Progress Notes (Signed)
CRITICAL VALUE ALERT  Critical Value:  Hemoglobin 6.6  Date & Time Notied:  03/23/2018 at 0746  Provider Notified: Dr. Shan Levans  Orders Received/Actions taken: Repeat CBC stat (subsequent CBC was 8.2).

## 2018-03-23 NOTE — Progress Notes (Signed)
Received critical hemoglobin of 6.6 around 0730.  Spoke to resident, and ordered repeat CBC, since patient has not exhibited any signs of bleeding.  In addition, creatinine was 1.79 and potassium was 2.9.  Repeat BMP also ordered.  Repeat Hgb was 8.2, repeat creatinine was 0.87, and repeat potassium was 3.6, which are consistent with yesterday's labs.  PRBC transfusion no longer indicated, and IV fluids with potassium discontinued.  Updated patient and son.

## 2018-03-23 NOTE — Progress Notes (Signed)
   Subjective: Pt reports feeling well overall, says cough has improved a great deal. Minimal abdominal pain mostly positional, does not feel short of breath at rest. Requiring minimal supplemental O2.     Objective:  Vital signs in last 24 hours: Vitals:   03/22/18 1950 03/23/18 0046 03/23/18 0349 03/23/18 0851  BP: (!) 106/58 (!) 105/54 105/60   Pulse: 79 76 73 70  Resp: (!) 22 (!) 23 13 19   Temp: 97.9 F (36.6 C)  97.6 F (36.4 C) 97.9 F (36.6 C)  TempSrc: Oral  Oral Oral  SpO2: 94% 97% 96% 97%  Weight:   157 lb 10.1 oz (71.5 kg)    Physical Exam  Constitutional: He is oriented to person, place, and time. No distress.  Resting comfortably in hospital bed  HENT:  Mouth/Throat: Oropharynx is clear and moist.  Eyes: Right eye exhibits no discharge. Left eye exhibits no discharge.  Cardiovascular: Normal rate, regular rhythm and intact distal pulses.  Pulmonary/Chest: Effort normal. No respiratory distress. He has no wheezes. He has rales (LLF, mild in RLF).  Abdominal: Soft. Bowel sounds are normal. He exhibits no distension. There is no tenderness.  Surgical site: Staples intact.  No erythema or drainage.  Musculoskeletal: He exhibits no edema.  Neurological: He is alert and oriented to person, place, and time.  Skin: Skin is warm and dry.   CBC Latest Ref Rng & Units 03/23/2018 03/23/2018 03/22/2018  WBC 4.0 - 10.5 K/uL 11.7(H) 9.7 11.2(H)  Hemoglobin 13.0 - 17.0 g/dL 8.2(L) 6.6(LL) 8.1(L)  Hematocrit 39.0 - 52.0 % 26.5(L) 21.1(L) 26.6(L)  Platelets 150 - 400 K/uL 496(H) 375 488(H)   Assessment/Plan:  Principal Problem:   Multifocal pneumonia Active Problems:   Malignant gastrointestinal stromal tumor (GIST) of stomach (HCC)   Sepsis ([name])  Inaccurate lab results noted today, repeating BMP, CBC repeat shows Hgb 8.2  Aspiration pneumonia: Fever 103 on 4/17, WBC up to 16.4 from 12.1 the previous day. CT chest showing nodular infiltrate in right lung suggestive of  infection and bibasilar atelectasis vs pneumonia. In addition, showing a moderate right pleural effusion and small left pleural effusion. Tmax 100 F in the past 48 hrs. White count now down to 12.6. Blood cx drawn 4/18 with NGTD. Currently requiring 3L O2 via [place]. Patient received Vancomycin on 4/17.   -Now on Unasyn, will continue -Supplemental O2 prn -Incentive spirometry  -PT/OT -white count trending down -thoracentesis labs pending  Malignant gastrointestinal stromal tumor (GIST) of stomach: S/p lap partial gastrectomy on 4/11. POD #8. Hgb 7.3 today. K 3.5. Able to tolerate PO intake. Pain well controlled.   -Surgery following; appreciate recs  -Soft diet  -Will need outpatient oncology follow-up    Chronic pain with chronic constipation: On Neurontin and Opana PRN at home. Patient reports having multiple BMs.   -Cont neurontin 300 mg TID -Morphine IR 30 mg q6 prn -Robaxin 500 mg Q8 hrs prn -Senokot-S qhs prn  Depression/ insomnia -Continue Mirtazapine 7.5 mg qhs -Continue Melatonin 3 mg qhs  Diarrhea: C. Diff neg, could be due to laxatives and not requiring his usual heavy doses of opiates  -resolved- last episode 7pm 4/21, continue to monitor -will give some IV fluids today due to losses from day prior, Cr on initial lab likely inaccurate, repeating BMP  Dispo: Anticipated discharge in approximately 2-3 day(s)   Katherine Roan, MD 03/23/2018, 10:51 AM Vickki Muff MD PGY-1 Internal Medicine Pager # [phone]

## 2018-03-24 DIAGNOSIS — Z6823 Body mass index (BMI) 23.0-23.9, adult: Secondary | ICD-10-CM

## 2018-03-24 DIAGNOSIS — Z91018 Allergy to other foods: Secondary | ICD-10-CM

## 2018-03-24 DIAGNOSIS — E46 Unspecified protein-calorie malnutrition: Secondary | ICD-10-CM

## 2018-03-24 LAB — BASIC METABOLIC PANEL
ANION GAP: 6 (ref 5–15)
BUN: 7 mg/dL (ref 6–20)
CO2: 25 mmol/L (ref 22–32)
CREATININE: 0.77 mg/dL (ref 0.61–1.24)
Calcium: 7.7 mg/dL — ABNORMAL LOW (ref 8.9–10.3)
Chloride: 109 mmol/L (ref 101–111)
GFR calc non Af Amer: >60 mL/min (ref >60)
Glucose, Bld: 99 mg/dL (ref 65–99)
Potassium: 3.4 mmol/L — ABNORMAL LOW (ref 3.5–5.1)
SODIUM: 140 mmol/L (ref 135–145)

## 2018-03-24 LAB — CBC
HEMATOCRIT: 27.2 % — AB (ref 39.0–52.0)
Hemoglobin: 8.3 g/dL — ABNORMAL LOW (ref 13.0–17.0)
MCH: 26.3 pg (ref 26.0–34.0)
MCHC: 30.5 g/dL (ref 30.0–36.0)
MCV: 86.1 fL (ref 78.0–100.0)
Platelets: 470 10*3/uL — ABNORMAL HIGH (ref 150–400)
RBC: 3.16 MIL/uL — ABNORMAL LOW (ref 4.22–5.81)
RDW: 16.3 % — AB (ref 11.5–15.5)
WBC: 11.4 10*3/uL — AB (ref 4.0–10.5)

## 2018-03-24 MED ORDER — GABAPENTIN 300 MG PO CAPS: 300.0000 mg | ORAL_CAPSULE | Freq: Three times a day (TID) | ORAL | Status: DC

## 2018-03-24 MED ORDER — GUAIFENESIN 100 MG/5ML PO SOLN: 5.0000 mL | ORAL | 0 refills | Status: DC | PRN

## 2018-03-24 MED ORDER — METHOCARBAMOL 500 MG PO TABS: 500.0000 mg | ORAL_TABLET | Freq: Three times a day (TID) | ORAL | Status: DC | PRN

## 2018-03-24 MED ORDER — CETAPHIL MOISTURIZING EX LOTN: 1.0000 "application " | TOPICAL_LOTION | Freq: Four times a day (QID) | CUTANEOUS | 0 refills | Status: DC | PRN

## 2018-03-24 MED ORDER — MORPHINE SULFATE 30 MG PO TABS: 30.0000 mg | ORAL_TABLET | Freq: Four times a day (QID) | ORAL | 0 refills | Status: AC | PRN

## 2018-03-24 MED ORDER — ADULT MULTIVITAMIN W/MINERALS CH
1.0000 | ORAL_TABLET | Freq: Every day | ORAL | Status: DC
Start: 2018-03-25 — End: 2018-07-23

## 2018-03-24 MED ORDER — GLUCERNA SHAKE PO LIQD: 237.0000 mL | Freq: Two times a day (BID) | ORAL | 0 refills | Status: DC

## 2018-03-24 MED ORDER — POLYVINYL ALCOHOL 1.4 % OP SOLN: 1.0000 [drp] | Freq: Three times a day (TID) | OPHTHALMIC | 0 refills | Status: DC | PRN

## 2018-03-24 MED ORDER — AMOXICILLIN-POT CLAVULANATE 875-125 MG PO TABS: 1.0000 | ORAL_TABLET | Freq: Two times a day (BID) | ORAL | Status: AC

## 2018-03-24 MED ORDER — MELATONIN 3 MG PO TABS: 3.0000 mg | ORAL_TABLET | Freq: Every day | ORAL | 0 refills | Status: DC

## 2018-03-24 MED ORDER — LOPERAMIDE HCL 2 MG PO CAPS: 2.0000 mg | ORAL_CAPSULE | ORAL | 0 refills | Status: DC | PRN

## 2018-03-24 MED ORDER — SENNOSIDES-DOCUSATE SODIUM 8.6-50 MG PO TABS: 1.0000 | ORAL_TABLET | Freq: Every evening | ORAL | Status: DC | PRN

## 2018-03-24 MED ORDER — PREMIER PROTEIN SHAKE: 11.0000 [oz_av] | Freq: Two times a day (BID) | ORAL | 0 refills | Status: DC

## 2018-03-24 MED ORDER — PANTOPRAZOLE SODIUM 40 MG PO TBEC: 40.0000 mg | DELAYED_RELEASE_TABLET | Freq: Every day | ORAL | Status: DC

## 2018-03-24 MED ORDER — ACETAMINOPHEN 650 MG RE SUPP: 650.0000 mg | RECTAL | 0 refills | Status: DC | PRN

## 2018-03-24 NOTE — Care Management Important Message (Signed)
Important Message  Patient Details  Name: Casey Gates MRN: [id number] Date of Birth: [birth year]   Medicare Important Message Given:  Yes    [name] P [name] 03/24/2018, 2:12 PM

## 2018-03-24 NOTE — Progress Notes (Signed)
Internal Medicine Attending:   I saw and examined the patient. I reviewed the resident's note and I agree with the resident's findings and plan as documented in the resident's note.  Patient states he feels better today and has no new complaints.  Patient is initially admitted with an aspiration pneumonia status post recent partial gastrectomy on April 11 for a malignant gastrointestinal stromal tumor.  Patient is currently on room air with O2 sats in the 90s.  His hemoglobin is stable.  No further work-up at this time.  We will transition patient from Unasyn to Augmentin to complete his course of antibiotics for his aspiration pneumonia.  Patient is also status post a thoracentesis which was transudative and shows no growth on cultures till now.  Patient stable for discharge to SNF when bed is available.

## 2018-03-24 NOTE — Clinical Social Work Placement (Signed)
   CLINICAL SOCIAL WORK PLACEMENT  NOTE  Date:  03/24/2018  Patient Details  Name: Casey Gates MRN: [id number] Date of Birth: 17-Apr-1926  Clinical Social Work is seeking post-discharge placement for this patient at the [place] level of care (*CSW will initial, date and re-position this form in  chart as items are completed):  Yes   Patient/family provided with [place] Work Department's list of facilities offering this level of care within the geographic area requested by the patient (or if unable, by the patient's family).  Yes   Patient/family informed of their freedom to choose among providers that offer the needed level of care, that participate in Medicare, Medicaid or managed care program needed by the patient, have an available bed and are willing to accept the patient.  Yes   Patient/family informed of [facility]'s ownership interest in [facility] and [facility], as well as of the fact that they are under no obligation to receive care at these facilities.  PASRR submitted to EDS on       PASRR number received on       Existing PASRR number confirmed on 03/16/18     FL2 transmitted to all facilities in geographic area requested by pt/family on 03/16/18     FL2 transmitted to all facilities within larger geographic area on       Patient informed that his/her managed care company has contracts with or will negotiate with certain facilities, including the following:  [employer]     Yes   Patient/family informed of bed offers received.  Patient chooses bed at [facility]     Physician recommends and patient chooses bed at      Patient to be transferred to [facility] on 03/24/18.  Patient to be transferred to facility by [facility]     Patient family notified on 03/24/18 of transfer.  Name of family member notified:  Kaulana Brindle, son     PHYSICIAN Please prepare priority discharge summary, including medications, Please  prepare prescriptions     Additional Comment:    _______________________________________________ Estanislado Emms, LCSW 03/24/2018, 10:53 AM

## 2018-03-24 NOTE — Progress Notes (Signed)
12 Days Post-Op   Subjective/Chief Complaint: Pt continues to improve.  No n/v.  Working on appetite.    Objective: Vital signs in last 24 hours: Temp:  [98 F (36.7 C)-99.1 F (37.3 C)] 98.9 F (37.2 C) (04/23 0817) Pulse Rate:  [63-77] 66 (04/23 0817) Resp:  [15-29] 28 (04/23 0805) BP: (109-126)/(55-62) 126/62 (04/23 0817) SpO2:  [92 %-97 %] 94 % (04/23 0817) Weight:  [71.3 kg (157 lb 3 oz)] 71.3 kg (157 lb 3 oz) (04/23 0402) Last BM Date: 03/23/18  Intake/Output from previous day: 04/22 0701 - 04/23 0700 In: 1040 [P.O.:840; IV Piggyback:200] Out: 600 [Urine:600] Intake/Output this shift: Total I/O In: 340 [P.O.:240; IV Piggyback:100] Out: 200 [Urine:200]  Exam: Awake and alert Minimal cough today. Abdomen soft, non-distended, incision clean  Lab Results:  Recent Labs    03/23/18 0756 03/24/18 0342  WBC 11.7* 11.4*  HGB 8.2* 8.3*  HCT 26.5* 27.2*  PLT 496* 470*   BMET Recent Labs    03/23/18 1032 03/24/18 0342  NA 139 140  K 3.6 3.4*  CL 108 109  CO2 24 25  GLUCOSE 115* 99  BUN 8 7  CREATININE 0.87 0.77  CALCIUM 7.8* 7.7*   PT/INR No results for input(s): LABPROT, INR in the last 72 hours. ABG No results for input(s): PHART, HCO3 in the last 72 hours.  Invalid input(s): PCO2, PO2  Studies/Results: No results found.  Anti-infectives: Anti-infectives (From admission, onward)   Start     Dose/Rate Route Frequency Ordered Stop   03/24/18 0000  amoxicillin-clavulanate (AUGMENTIN) 875-125 MG tablet     1 tablet Oral 2 times daily 03/24/18 1059 03/29/18 2359   03/22/18 1400  Ampicillin-Sulbactam (UNASYN) 3 g in sodium chloride 0.9 % 100 mL IVPB     3 g 200 mL/hr over 30 Minutes Intravenous Every 8 hours 03/22/18 1345     03/19/18 1400  vancomycin (VANCOCIN) IVPB 1000 mg/200 mL premix  Status:  Discontinued     1,000 mg 200 mL/hr over 60 Minutes Intravenous Every 24 hours 03/18/18 1228 03/19/18 0820   03/19/18 1300  ceFEPIme (MAXIPIME) 1 g in  sodium chloride 0.9 % 100 mL IVPB  Status:  Discontinued     1 g 200 mL/hr over 30 Minutes Intravenous Every 24 hours 03/18/18 1228 03/18/18 1311   03/18/18 1230  vancomycin (VANCOCIN) 1,250 mg in sodium chloride 0.9 % 250 mL IVPB     1,250 mg 166.7 mL/hr over 90 Minutes Intravenous  Once 03/18/18 1228 03/18/18 1600   03/18/18 1230  ceFEPIme (MAXIPIME) 2 g in sodium chloride 0.9 % 100 mL IVPB  Status:  Discontinued     2 g 200 mL/hr over 30 Minutes Intravenous  Once 03/18/18 1228 03/18/18 1340   03/15/18 2300  piperacillin-tazobactam (ZOSYN) IVPB 3.375 g  Status:  Discontinued     3.375 g 12.5 mL/hr over 240 Minutes Intravenous Every 8 hours 03/15/18 1553 03/22/18 1336   03/15/18 1600  piperacillin-tazobactam (ZOSYN) IVPB 3.375 g     3.375 g 100 mL/hr over 30 Minutes Intravenous  Once 03/15/18 1553 03/15/18 1841   03/12/18 1700  ceFAZolin (ANCEF) IVPB 2g/100 mL premix     2 g 200 mL/hr over 30 Minutes Intravenous Every 8 hours 03/12/18 1605 03/12/18 1907   03/12/18 0644  ceFAZolin (ANCEF) IVPB 2g/100 mL premix     2 g 200 mL/hr over 30 Minutes Intravenous On call to O.R. 03/12/18 0814 03/12/18 0933      Assessment/Plan: s/p  Procedure(s): LAPAROSCOPIC ASSISTED PARTIAL GASTRECTOMY ERAS PATHWAY (N/A)   Pneumonia  ABL anemia on CBL anemia - stable post transfusion, AKI resolved.   D/c staples. Ok to transition to reg diet.  Await stability to return to SNF.   LOS: 12 days    Stark Klein 03/24/2018

## 2018-03-24 NOTE — Progress Notes (Addendum)
Patient will discharge to [facility] Anticipated discharge date: 03/24/18 Family notified: Isa Kohlenberg, son Transportation by: [facility]  Nurse to call report to [phone]. Patient will go to room 603P at the facility.   CSW signing off.  Estanislado Emms, [place]  Clinical Social Worker

## 2018-03-24 NOTE — Progress Notes (Signed)
   Subjective: Pt reports feeling well still, had some more diarrhea but has slowed down. Continues to have decreased.  Pt was 94% on room air this morning denies SOB.    Objective:  Vital signs in last 24 hours: Vitals:   03/23/18 1125 03/23/18 2029 03/23/18 2359 03/24/18 0402  BP:  (!) 116/59 113/61 (!) 109/55  Pulse: 71 77 73 67  Resp: 18 15 (!) 23 (!) 29  Temp: 98 F (36.7 C) 99.1 F (37.3 C) 98.9 F (37.2 C) 98.3 F (36.8 C)  TempSrc: Oral Oral Oral Oral  SpO2:  95% 97% 92%  Weight:    157 lb 3 oz (71.3 kg)   Physical Exam  Constitutional: He is oriented to person, place, and time. No distress.  Resting comfortably in hospital bed  HENT:  Mouth/Throat: Oropharynx is clear and moist.  Eyes: Right eye exhibits no discharge. Left eye exhibits no discharge.  Cardiovascular: Normal rate, regular rhythm and intact distal pulses.  Pulmonary/Chest: Effort normal. No respiratory distress. He has no wheezes. He has rales (LLF, mild in RLF).  Abdominal: Soft. Bowel sounds are normal. He exhibits no distension. There is no tenderness.  Surgical site: Staples intact.  No erythema or drainage.  Musculoskeletal: He exhibits no edema.  Neurological: He is alert and oriented to person, place, and time.  Skin: Skin is warm and dry.   CBC Latest Ref Rng & Units 03/24/2018 03/23/2018 03/23/2018  WBC 4.0 - 10.5 K/uL 11.4(H) 11.7(H) 9.7  Hemoglobin 13.0 - 17.0 g/dL 8.3(L) 8.2(L) 6.6(LL)  Hematocrit 39.0 - 52.0 % 27.2(L) 26.5(L) 21.1(L)  Platelets 150 - 400 K/uL 470(H) 496(H) 375   Assessment/Plan:  Principal Problem:   Multifocal pneumonia Active Problems:   Malignant gastrointestinal stromal tumor (GIST) of stomach (HCC)   Sepsis ([place])  Inaccurate lab results noted yesterday repeat CBC and BMP were more consistent  Aspiration pneumonia: Fever 103 on 4/17, WBC up to 16.4 from 12.1 the previous day. CT chest showing nodular infiltrate in right lung suggestive of infection and bibasilar  atelectasis vs pneumonia. In addition, showing a moderate right pleural effusion and small left pleural effusion. Tmax 100 F in the past 48 hrs. White count now down to 12.6. Blood cx drawn 4/18 with NGTD. Currently requiring 3L O2 via [place]. Patient received Vancomycin on 4/17.   -Now on Unasyn, will continue on discharge with 5 days of augmentin -Supplemental O2 prn -Incentive spirometry  -PT/OT -white count trending down -thoracentesis labs show exudate neg gram stain, neg to date for culture  Malignant gastrointestinal stromal tumor (GIST) of stomach: S/p lap partial gastrectomy on 4/11. POD #8. Hgb 7.3 today. K 3.5. Able to tolerate PO intake. Pain well controlled.   -Surgery following; appreciate recs  -Soft diet  -Will need outpatient oncology follow-up    Chronic pain with chronic constipation: On Neurontin and Opana PRN at home. Patient reports having multiple BMs.   -Cont neurontin 300 mg TID -Morphine IR 30 mg q6 prn -Robaxin 500 mg Q8 hrs prn -Senokot-S qhs prn  Depression/ insomnia -Continue Mirtazapine 7.5 mg qhs -Continue Melatonin 3 mg qhs  Diarrhea: C. Diff neg, could be due to laxatives and not requiring his usual heavy doses of opiates  -a few episodes overnight but overall much better, continue loperamide PRN   Dispo: Anticipated discharge to SNF today as pt medically stable  Katherine Roan, MD 03/24/2018, 7:31 AM Vickki Muff MD PGY-1 Internal Medicine Pager # [phone]

## 2018-03-24 NOTE — Progress Notes (Signed)
Casey Gates has authorization for patient to admit to SNF today. CSW to support with discharge.  Estanislado Emms, [place]

## 2018-03-24 NOTE — Progress Notes (Signed)
Pt has had 2 short bursts SVT nonsustained on telemetry while lying in bed.  VSS. Pt asymptomatic.  Will cont to monitor closely.

## 2018-03-25 LAB — BPAM RBC
Blood Product Expiration Date: 201905092359
Blood Product Expiration Date: 201905092359
ISSUE DATE / TIME: 201904212109
ISSUE DATE / TIME: 201904212109
Unit Type and Rh: 6200
Unit Type and Rh: 6200

## 2018-03-25 LAB — TYPE AND SCREEN
ABO/RH(D): A POS
Antibody Screen: NEGATIVE
Unit division: 0
Unit division: 0

## 2018-03-27 LAB — CULTURE, BODY FLUID W GRAM STAIN -BOTTLE: Culture: NO GROWTH

## 2018-04-01 DIAGNOSIS — C49A0 Gastrointestinal stromal tumor, unspecified site: Secondary | ICD-10-CM | POA: Diagnosis not present

## 2018-04-01 DIAGNOSIS — Z9889 Other specified postprocedural states: Secondary | ICD-10-CM | POA: Diagnosis not present

## 2018-04-01 DIAGNOSIS — J189 Pneumonia, unspecified organism: Secondary | ICD-10-CM | POA: Diagnosis not present

## 2018-04-01 DIAGNOSIS — Z8719 Personal history of other diseases of the digestive system: Secondary | ICD-10-CM | POA: Diagnosis not present

## 2018-04-09 ENCOUNTER — Encounter: Payer: Self-pay | Admitting: Nurse Practitioner

## 2018-04-09 ENCOUNTER — Telehealth: Payer: Self-pay | Admitting: Nurse Practitioner

## 2018-04-09 NOTE — Telephone Encounter (Signed)
Referring MD: Dr. Barry Dienes from [facility] for dx of GIST  Spoke to the pt's son [name] to get his father scheduled to see Wilnette Kales on 5/22 at 2pm. I offered an appt with Dr. Benay Spice at [place] on 5/22, but was unable to make that time. Aware that his father should arrive 30 minutes early to be checked in on time. Letter mailed to the son's address.

## 2018-04-22 ENCOUNTER — Ambulatory Visit: Payer: Medicare Other | Admitting: Oncology

## 2018-04-22 ENCOUNTER — Telehealth: Payer: Self-pay | Admitting: Nurse Practitioner

## 2018-04-22 ENCOUNTER — Inpatient Hospital Stay: Payer: Medicare Other | Attending: Oncology | Admitting: Nurse Practitioner

## 2018-04-22 ENCOUNTER — Encounter: Payer: Self-pay | Admitting: Nurse Practitioner

## 2018-04-22 ENCOUNTER — Inpatient Hospital Stay: Payer: Medicare Other

## 2018-04-22 VITALS — BP 135/76 | HR 83 | Temp 98.1°F | Resp 17 | Ht 69.0 in | Wt 148.0 lb

## 2018-04-22 DIAGNOSIS — D500 Iron deficiency anemia secondary to blood loss (chronic): Secondary | ICD-10-CM | POA: Insufficient documentation

## 2018-04-22 DIAGNOSIS — C49A2 Gastrointestinal stromal tumor of stomach: Secondary | ICD-10-CM

## 2018-04-22 DIAGNOSIS — N189 Chronic kidney disease, unspecified: Secondary | ICD-10-CM | POA: Insufficient documentation

## 2018-04-22 DIAGNOSIS — I2510 Atherosclerotic heart disease of native coronary artery without angina pectoris: Secondary | ICD-10-CM | POA: Insufficient documentation

## 2018-04-22 DIAGNOSIS — G4700 Insomnia, unspecified: Secondary | ICD-10-CM | POA: Diagnosis not present

## 2018-04-22 DIAGNOSIS — Z8546 Personal history of malignant neoplasm of prostate: Secondary | ICD-10-CM | POA: Diagnosis not present

## 2018-04-22 DIAGNOSIS — K449 Diaphragmatic hernia without obstruction or gangrene: Secondary | ICD-10-CM

## 2018-04-22 DIAGNOSIS — I700 Atherosclerosis of aorta: Secondary | ICD-10-CM | POA: Diagnosis not present

## 2018-04-22 DIAGNOSIS — K5909 Other constipation: Secondary | ICD-10-CM | POA: Insufficient documentation

## 2018-04-22 DIAGNOSIS — M5136 Other intervertebral disc degeneration, lumbar region: Secondary | ICD-10-CM | POA: Diagnosis not present

## 2018-04-22 DIAGNOSIS — Z79899 Other long term (current) drug therapy: Secondary | ICD-10-CM | POA: Diagnosis not present

## 2018-04-22 DIAGNOSIS — D62 Acute posthemorrhagic anemia: Secondary | ICD-10-CM

## 2018-04-22 LAB — CMP (CANCER CENTER ONLY)
ALBUMIN: 3.4 g/dL — AB (ref 3.5–5.0)
ALK PHOS: 102 U/L (ref 40–150)
ALT: 12 U/L (ref 0–55)
AST: 17 U/L (ref 5–34)
Anion gap: 8 (ref 3–11)
BILIRUBIN TOTAL: 0.3 mg/dL (ref 0.2–1.2)
BUN: 14 mg/dL (ref 7–26)
CALCIUM: 9.1 mg/dL (ref 8.4–10.4)
CO2: 27 mmol/L (ref 22–29)
CREATININE: 0.85 mg/dL (ref 0.70–1.30)
Chloride: 106 mmol/L (ref 98–109)
GFR, Est AFR Am: >60 mL/min (ref >60)
GFR, Estimated: >60 mL/min (ref >60)
Glucose, Bld: 93 mg/dL (ref 70–140)
Potassium: 4.2 mmol/L (ref 3.5–5.1)
SODIUM: 141 mmol/L (ref 136–145)
TOTAL PROTEIN: 6.6 g/dL (ref 6.4–8.3)

## 2018-04-22 LAB — CBC WITH DIFFERENTIAL (CANCER CENTER ONLY)
BASOS ABS: 0 10*3/uL (ref 0.0–0.1)
BASOS PCT: 1 %
EOS ABS: 0.4 10*3/uL (ref 0.0–0.5)
EOS PCT: 5 %
HCT: 36.9 % — ABNORMAL LOW (ref 38.4–49.9)
Hemoglobin: 11.4 g/dL — ABNORMAL LOW (ref 13.0–17.1)
LYMPHS PCT: 23 %
Lymphs Abs: 1.7 10*3/uL (ref 0.9–3.3)
MCH: 25.3 pg — ABNORMAL LOW (ref 27.2–33.4)
MCHC: 30.8 g/dL — ABNORMAL LOW (ref 32.0–36.0)
MCV: 82.2 fL (ref 79.3–98.0)
MONO ABS: 0.7 10*3/uL (ref 0.1–0.9)
Monocytes Relative: 10 %
Neutro Abs: 4.5 10*3/uL (ref 1.5–6.5)
Neutrophils Relative %: 61 %
PLATELETS: 214 10*3/uL (ref 140–400)
RBC: 4.49 MIL/uL (ref 4.20–5.82)
RDW: 21.1 % — AB (ref 11.0–14.6)
WBC Count: 7.3 10*3/uL (ref 4.0–10.3)

## 2018-04-22 NOTE — Telephone Encounter (Signed)
Scheduled appt per 5/22 los - sent reminder letter in the mail with appt date and time.

## 2018-04-22 NOTE — Progress Notes (Addendum)
[place]  [phone] [phone]) [phone]  Clinic New Consult Note   Patient Care Team: Velna Hatchet, MD as PCP - General (Internal Medicine) [name], MD as PCP - Cardiology (Cardiology) 04/22/2018  CHIEF COMPLAINTS/PURPOSE OF CONSULTATION:  Gastrointestinal stromal tumor (GIST)  REFERRED BY: Dr. Stark Klein   SUMMARY OF ONCOLOGY HISTORY    Malignant gastrointestinal stromal tumor (GIST) of stomach ([place])   02/16/2018 Procedure    Upper EGD per Dr. [name] Impression:  - Normal upper third of esophagus and middle third of esophagus. - 5 cm hiatal hernia. - Widely patent Schatzki ring. - Clotted blood in the gastric body. - Rule out malignancy, gastric tumor on the lesser curvature of the stomach. Injected. Clips were placed. - Normal examined duodenum. - No specimens collected.      02/17/2018 Imaging    CT AP IMPRESSION: 1. 9.7 x 8.8 x 10.9 cm aggressive appearing heterogeneously enhancing mass along the lesser curvature of the stomach, highly concerning for primary gastric neoplasm. This is intimately associated with the undersurface of the left lobe of the liver without definitive evidence of direct invasion (although superficial involvement of the hepatic capsule is suspected given the loss of the intervening fat plane). There is also an indeterminate left adrenal nodule measuring 2.0 x 1.4 cm, which could be metastatic. No other definite signs of metastatic disease elsewhere in the abdomen or pelvis. 2. Areas of cylindrical bronchiectasis and chronic scarring in the lower lobes of the lungs bilaterally. 3. Aortic atherosclerosis, in addition to left main and 3 vessel coronary artery disease. 4. Right kidney is either surgically or congenitally absent, or severely atrophic. 5. Additional incidental findings, as above.      02/19/2018 Initial Biopsy    VIA CT BIOPSY: Diagnosis -  Soft Tissue Needle Core Biopsy, gastric - FINDINGS  CONSISTENT WITH GASTROINTESTINAL STROMAL TUMOR (GIST). - SEE COMMENT.      03/12/2018 Initial Diagnosis    Malignant gastrointestinal stromal tumor (GIST) of stomach ([place])      03/12/2018 Pathology Results    Surgical path: Diagnosis Stomach, resection for tumor, Lesser curvature - GASTROINTESTINAL STROMAL TUMOR, 13.8 CM.      03/18/2018 Imaging    CT CAP IMPRESSION: -obtained d/t post op fever, leukocytosis, and respiratory change   Moderate right pleural effusion and small left pleural effusion. Compressive atelectasis versus pneumonia in the lower lobes. Nodular airspace disease throughout much of the right upper lobe and right middle lobe concerning for pneumonia.  Cardiomegaly. Severe/diffuse coronary artery disease. Aortic atherosclerosis. No evidence of aortic aneurysm.  Postoperative changes from recent partial gastrectomy. Small amount of free air in the abdomen, likely related to post op state. No evidence of bowel obstruction.  Prior remote right nephrectomy and prostatectomy.      03/22/2018 Procedure    Thoracentesis  Diagnosis PLEURAL FLUID, RIGHT (SPECIMEN 1 OF 1, COLLECTED ON 03/22/2018): NO MALIGNANT CELLS IDENTIFIED.        04/22/2018 Cancer Staging    Staging form: Gastrointestinal Stromal Tumor - Gastric and Omental GIST, AJCC 8th Edition - Pathologic stage from 04/22/2018: Stage IIIB (pT4, pN0, cM0, Mitotic Rate: High) - Signed by Alla Feeling, NP on 04/22/2018      HISTORY OF PRESENTING ILLNESS:  Casey Gates 84 y.o. male is here because of recent diagnosis of GIST. He presents with his son; was referred by Dr. Barry Dienes. He initially became symptomatic in January 2019 with decreased appetite, abdominal fullness, and intermittent atypical chest pain;  he went to ED for evaluation. CTA ruled out PE and he was discharged home with outpatient cardiology f/u.  He then presented to [facility] ED on 02/16/18 for rectal bleeding and hematemesis on aspirin therapy.  He was hypotensive with acute anemia in the emergency department, Hgb 7.7 (previously 14.9 in 12/2017). He was stabilized with IVF and 2 units RBCs and admitted for GI bleed work up.  He underwent upper endoscopy per Dr. [name] on 02/16/2018 which showed a large, submucosal partially circumferential mass with no bleeding but stigmata of recent bleeding on the lesser curvature of the stomach.  There was a small area of ulceration with pigmentation noted as well as a large amount of dark red, coffee-ground colored and clotted blood found in the gastric body.  The mass was biopsied, findings consistent with gastrointestinal stromal tumor.  He then underwent staging CT of the abdomen and pelvis on 02/17/2018 which noted the 9.7 x 8.8 x 10.9 enhancing mass along the area of concern which was intimately associated with the undersurface of the left lobe of the liver without definitive evidence of direct invasion.  There was an indeterminate left adrenal nodule measuring 2.0 x 1.4 cm which could be metastatic.  No other evidence of metastatic disease in the abdomen or pelvis.  Hemoglobin improved and he was discharged to [facility] facility with outpatient surgery follow-up scheduled with Dr. Barry Dienes.  Given his high risk for re-bleeding, he was taken to the operating room by Dr. Barry Dienes on 03/12/18 who performed a laparoscopic assisted converted to open partial gastrectomy. Surgical pathology confirmed gastrointestinal stromal tumor measuring 13.8 cm with associated mucosal ulceration extending into the underlying mass; predominately spindle features, mitotic rate greater than 20/50 high power field, grade 2. Final staging revealed pT4pNX. Post op course was complicated by pneumonia requiring thoracentesis. Cytology was negative for malignant cells. No abdominal complications were noted on postop imaging.  He was discharged to SNF on 03/24/2018 where he currently resides.   He has a personal history of  prostate cancer more than 10 years ago s/p TURP, OSA on CPAP, restless leg syndrome, and chronic back pain on oxymorphone. Is is s/p nephrectomy for "kidney cysts." He previously lived in independent housing at [facility] for 5 years prior to his diagnosis; he ambulated independently with a walker and had assistance with meal prep.  He has remote alcohol history, quit drinking 35 years ago.  He is a never smoker.  He is widowed.  Has 2 children, one living and healthy, one deceased from blood clot.  Family history of "bone" cancer in his mother, and prostate and a great-great grandfather.   Today he reports a slow recovery, energy level is low but improving.  He receives PT therapy daily except on weekends.  His legs are weak, presents in a wheelchair today.  Appetite is low but improving, not back to baseline.  Was previously on Remeron for appetite stimulant but not currently taking.  He has chronic constipation uses MiraLAX and prune juice as needed.  No recent blood in stool.  Denies abdominal pain, nausea or vomiting.  No recent fever, cough, dyspnea, chest pain, or wheezing.  MEDICAL HISTORY:  Past Medical History:  Diagnosis Date  . Anemia   . Asthma    AS A CHILD  . Blind left eye   . Cancer [facility])    PROSTATE CANCER  . Cellulitis    frequently, "all over"  . Chronic kidney disease   .  Chronic pain syndrome   . Constipation   . DDD (degenerative disc disease), lumbar   . Gastrointestinal stromal tumor (GIST) of stomach ([place])   . GI bleeding   . History of blood transfusion    01/2018  . Insomnia   . Kyphoscoliosis   . Mass in the abdomen 01/2018  . Restless leg   . Sleep apnea    does not wear CPAP  . Wears glasses     SURGICAL HISTORY: Past Surgical History:  Procedure Laterality Date  . CHOLECYSTECTOMY    . COLONOSCOPY    . ESOPHAGOGASTRODUODENOSCOPY (EGD) WITH PROPOFOL Left 02/16/2018   Procedure: ESOPHAGOGASTRODUODENOSCOPY (EGD) WITH PROPOFOL;  Surgeon: [name], MD;  Location: [facility];  Service: Gastroenterology;  Laterality: Left;  . LAPAROSCOPIC GASTRECTOMY N/A 03/12/2018   Procedure: LAPAROSCOPIC ASSISTED PARTIAL GASTRECTOMY ERAS PATHWAY;  Surgeon: Stark Klein, MD;  Location: [facility];  Service: General;  Laterality: N/A;  . LAPAROSCOPIC PARTIAL GASTRECTOMY  03/12/2018   LAPAROSCOPIC ASSISTED PARTIAL GASTRECTOMY ERAS PATHWAY  . PROSTATECTOMY    . QUADRICEPS TENDON REPAIR Left 07/07/2017   Procedure: REPAIR QUADRICEP TENDON;  Surgeon: Rod Can, MD;  Location: [facility];  Service: Orthopedics;  Laterality: Left;  . RIGHT CATARACT EXTRACTION  2013  . RIGTH NEPHRECTOMY  2006  . TONSILLECTOMY    . TRANSURETHRAL RESECTION OF PROSTATE  1997    SOCIAL HISTORY: Social History   Socioeconomic History  . Marital status: Widowed    Spouse name: Not on file  . Number of children: 2  . Years of education: Not on file  . Highest education level: Not on file  Occupational History  . Not on file  Social Needs  . Financial resource strain: Not on file  . Food insecurity:    Worry: Not on file    Inability: Not on file  . Transportation needs:    Medical: Not on file    Non-medical: Not on file  Tobacco Use  . Smoking status: Never Smoker  . Smokeless tobacco: Never Used  Substance and Sexual Activity  . Alcohol use: No    Alcohol/week: 0.0 oz    Comment: quit 1985, 2 fifths/ week,S/P rehab  . Drug use: No  . Sexual activity: Never  Lifestyle  . Physical activity:    Days per week: Not on file    Minutes per session: Not on file  . Stress: Not on file  Relationships  . Social connections:    Talks on phone: Not on file    Gets together: Not on file    Attends religious service: Not on file    Active member of club or organization: Not on file    Attends meetings of clubs or organizations: Not on file    Relationship status: Not on file  . Intimate partner violence:    Fear of current or ex partner: Not on file     Emotionally abused: Not on file    Physically abused: Not on file    Forced sexual activity: Not on file  Other Topics Concern  . Not on file  Social History Narrative   Caffeine 2-3 cups average.  Widowed,  Lives at [facility] [place] Living.  Retired Forensic psychologist.  One son.    FAMILY HISTORY: Family History  Problem Relation Age of Onset  . Bone cancer Mother   . Hypertension Mother   . Arthritis Mother   . Clotting disorder Daughter        died of  blood clot     ALLERGIES:  is allergic to coconut flavor [flavoring agent]; coconut oil; and food.  MEDICATIONS:  Current Outpatient Medications  Medication Sig Dispense Refill  . ferrous sulfate 325 (65 FE) MG EC tablet Take 325 mg by mouth daily with breakfast.    . gabapentin (NEURONTIN) 300 MG capsule Take 1 capsule (300 mg total) by mouth 3 (three) times daily.    Marland Kitchen ipratropium-albuterol (DUONEB) 0.5-2.5 (3) MG/3ML SOLN Take 3 mLs by nebulization every 6 (six) hours as needed.    . methocarbamol (ROBAXIN) 500 MG tablet Take 1 tablet (500 mg total) by mouth every 8 (eight) hours as needed for muscle spasms.    . Multiple Vitamin (MULTIVITAMIN WITH MINERALS) TABS tablet Take 1 tablet by mouth daily.    Marland Kitchen oxymorphone (OPANA ER) 10 MG 12 hr tablet Take 10 mg by mouth 4 (four) times daily as needed for pain.    . polyethylene glycol (MIRALAX / GLYCOLAX) packet Take 17 g by mouth daily.    Marland Kitchen acetaminophen (TYLENOL) 650 MG suppository Place 1 suppository (650 mg total) rectally every 4 (four) hours as needed for fever. (Patient not taking: Reported on 04/22/2018) 12 suppository 0  . cetaphil (CETAPHIL) lotion Apply 1 application topically 4 (four) times daily as needed (FOR DRY SKIN.). (Patient not taking: Reported on 04/22/2018) 236 mL 0  . feeding supplement, GLUCERNA SHAKE, (GLUCERNA SHAKE) LIQD Take 237 mLs by mouth 2 (two) times daily between meals. (Patient not taking: Reported on 04/22/2018)  0  . guaiFENesin (ROBITUSSIN) 100 MG/5ML  SOLN Take 5 mLs (100 mg total) by mouth every 4 (four) hours as needed for cough or to loosen phlegm. (Patient not taking: Reported on 04/22/2018) 1200 mL 0  . loperamide (IMODIUM) 2 MG capsule Take 1 capsule (2 mg total) by mouth as needed for diarrhea or loose stools. 30 capsule 0  . Melatonin 3 MG TABS Take 1 tablet (3 mg total) by mouth at bedtime. (Patient not taking: Reported on 04/22/2018)  0  . mirtazapine (REMERON) 7.5 MG tablet Take 7.5 mg by mouth at bedtime.    . pantoprazole (PROTONIX) 40 MG tablet Take 1 tablet (40 mg total) by mouth daily. (Patient not taking: Reported on 04/22/2018)    . polyvinyl alcohol (LIQUIFILM TEARS) 1.4 % ophthalmic solution Place 1-2 drops into both eyes 3 (three) times daily as needed (FOR DRY EYES.). (Patient not taking: Reported on 04/22/2018) 15 mL 0  . protein supplement shake (PREMIER PROTEIN) LIQD Take 325 mLs (11 oz total) by mouth 2 (two) times daily between meals. (Patient not taking: Reported on 04/22/2018)  0  . senna-docusate (SENOKOT-S) 8.6-50 MG tablet Take 1 tablet by mouth at bedtime as needed for mild constipation. (Patient not taking: Reported on 04/22/2018)     No current facility-administered medications for this visit.     REVIEW OF SYSTEMS:   Constitutional: Denies fevers, chills or abnormal night sweats (+) fatigue, slowly improving (+) low appetite, slowly improving  Respiratory: Denies cough, dyspnea or wheezes Cardiovascular: Denies palpitation, chest discomfort or lower extremity swelling Gastrointestinal:  Denies nausea, vomiting, diarrhea, abdominal pain, hematochezia, heartburn or change in bowel habits (+) chronic constipation, miralax and prune juice PRN (+) last BM 1-2 days ago  Skin: Denies abnormal skin rashes (+) sensitive skin (+) left leg with healing wound, dressed in bandage wrap from ankle to thigh  Lymphatics: Denies new lymphadenopathy or easy bruising Neurological:Denies numbness, tingling (+) weakness    Behavioral/Psych: Mood  is stable, no new changes  MSK: (+) chronic back pain (+) restless leg syndrome  All other systems were reviewed with the patient and are negative.  PHYSICAL EXAMINATION: ECOG PERFORMANCE STATUS: 3-4   Vitals:   04/22/18 1408  BP: 135/76  Pulse: 83  Resp: 17  Temp: 98.1 F (36.7 C)  SpO2: 97%   Filed Weights   04/22/18 1408  Weight: 148 lb (67.1 kg)    GENERAL:alert, no distress. Presents in wheelchair  SKIN: no rashes or significant lesions (+) left leg in gauze bandage from ankle to thigh, dressing appears to have serosanguinous drainage  EYES: normal, conjunctiva are pink and non-injected, sclera clear OROPHARYNX:no thrush or ulcers    LYMPH:  no palpable cervical, supraclavicular, or axillary lymphadenopathy  LUNGS: clear to auscultation with normal breathing effort HEART: regular rate & rhythm and no murmurs and no lower extremity edema ABDOMEN:abdomen soft, non-tender and normal bowel sounds. (+) midline abdominal incision is healing well, mild erythema, no drainage; few steri strips intact   Musculoskeletal:no cyanosis of digits and no clubbing  PSYCH: alert & oriented x 3 with fluent speech NEURO: generalized weakness  LABORATORY DATA:  I have reviewed the data as listed CBC Latest Ref Rng & Units 04/22/2018 03/24/2018 03/23/2018  WBC 4.0 - 10.3 K/uL 7.3 11.4(H) 11.7(H)  Hemoglobin 13.0 - 17.1 g/dL 11.4(L) 8.3(L) 8.2(L)  Hematocrit 38.4 - 49.9 % 36.9(L) 27.2(L) 26.5(L)  Platelets 140 - 400 K/uL 214 470(H) 496(H)   CMP Latest Ref Rng & Units 04/22/2018 03/24/2018 03/23/2018  Glucose 70 - 140 mg/dL 93 99 115(H)  BUN 7 - 26 mg/dL _0 Creatinine 0.70 - 1.30 mg/dL 0.85 0.77 0.87  Sodium 136 - 145 mmol/L 141 140 139  Potassium 3.5 - 5.1 mmol/L 4.2 3.4(L) 3.6  Chloride 98 - 109 mmol/L 106 109 108  CO2 22 - 29 mmol/L _1 Calcium 8.4 - 10.4 mg/dL 9.1 7.7(L) 7.8(L)  Total Protein 6.4 - 8.3 g/dL 6.6 - -  Total Bilirubin 0.2 - 1.2 mg/dL 0.3  - -  Alkaline Phos 40 - 150 U/L 102 - -  AST 5 - 34 U/L 17 - -  ALT 0 - 55 U/L 12 - -     PATHOLOGY  Diagnosis Stomach, resection for tumor, Lesser curvature - GASTROINTESTINAL STROMAL TUMOR, 13.8 CM. - SEE ONCOLOGY TABLE. Microscopic Comment GASTROINTESTINAL STROMAL TUMOR (GIST): Procedure: Resection of lesser gastric curvature mass. Tumor Site: Stomach, lesser curvature. Specify: Lesser curvature. Tumor Size: 13.8 cm. Tumor Focality: Unifocal GIST Subtype (spindle, epithelioid, mixed, etc.): Predominately spindled. Mitotic Rate: Greater than 20/50 HIGH POWER FIELD Necrosis: Present, focal. Histologic Grade: G2. G1: Low grade; mitotic rate 5/50 HIGH POWER FIELD. G2: High grade; mitotic rate >5/50 HIGH POWER FIELD. Risk Assessment: High risk. Margins: Free of tumor. Distance of tumor from closest margin: Less than 0.2 cm. Ancillary testing: Immunohistochemistry, see comment. Lymph nodes: number examined: 0; number positive: N/A Pathologic Staging: pT4 pNX. Comments: The tumor is 13.8 cm in greatest dimension and is associated with mucosal ulceration which extends into the underlying mass. The tumor has a predominate spindle cell pattern and the mitotic rate is greater than 20/50 high power fields. There is hypercellularity and variable nuclear atypia. Immunohistochemistry is performed and the tumor is positive with CD117 (C-Kit) and CD34 and negative with cytokeratin AE1/AE3. Ki-67 immunohistochemistry shows a Ki-67 index of 20%. (JDP:gt, 03/16/18)ss and Clin Specimen(s) Obtained: Stomach, resection for tumor, Lesser curvature Specimen Clinical Information GIST  stomach gastrointestinal stromal tumor (tl) Gross Received fresh is a 13.8 x 9.2 x 6.7 cm firm nodular mass with an attached 7.0 x 6.5 cm portion of stomach, clinically lesser curvature. The gastric mucosa shows a central 1.2 x 0.5 cm ulcer. The ulcer extends into the nodular mass. The cut surface of the mass is tan-white  with a 7.0 cm cavitary defect containing bloody fluid and clotted blood. There are focal areas of calcification. There is a line of staples present along one edge of the portion of stomach. Sections are submitted in ten cassettes. A-B = sections parallel to stapled gastric mucosa C = section of mass to include overlying ulcerated gastric mucosa D-J = sections of nodular mass. (GP:ah 03/13/18) Stain(s) used in Diagnosis: The following stain(s) were used in diagnosing the case: CD117 (C-KIT), KI-67-NO ACIS, CD 34, CK AE1AE3. The control(s) stained appropriately.    PROCEDURES:   EGD per Dr. [name]: Impression - - Normal upper third of esophagus and middle third of esophagus. - 5 cm hiatal hernia. - Widely patent Schatzki ring. - Clotted blood in the gastric body. - Rule out malignancy, gastric tumor on the lesser curvature of the stomach. Injected. Clips were placed. - Normal examined duodenum. - No specimens collected.   RADIOGRAPHIC STUDIES: I have personally reviewed the radiological images as listed and agreed with the findings in the report. No results found.  ASSESSMENT & PLAN: Mr. Delmont Prosch is a pleasant 84 year old caucasian male with PMH of prostate cancer presented with decreased appetite, abdominal fullness, GI bleeding, and anemia found to have large malignant mass in the stomach s/p partial gastrectomy  1. Gastrointestinal Stromal Tumor (GIST) of the lesser curvature of the stomach, 13.8 cm, pT4pNX, G2, mitotic rate >20/50 high power field, stage IIIB -We reviewed his medical records including labs, imaging, and pathology in detail with the patient and his son. He underwent complete resection per Dr. Barry Dienes on 03/12/18. Pathology revealed a large 13.8 cm GIST in the stomach. IHC was performed and the tumor is positive with CD117 (C-Kit). He has high risk features such as large size, high mitotic rate, and the location. His risk of recurrence is around 86%. Dr. Burr Medico reviewed  indication, risk, and benefit of TKI therapy with Gleevec. She discussed side effects including but not limited to fatigue, diarrhea, skin rash, fluid retention. He is elderly and has not completely recovered to his baseline, but Dr. Burr Medico recommends he try the medication from a low dose and gradually titrate to see if he can tolerate to reduce his risk of disease recurrence. Low threshold to stop therapy if he has side effects. -Will request molecular studies to see which type of KIT mutation he has, to determine if he would benefit from gleevec. If his features do not indicate likliehood of response, we would not offer him gleevec.  -in that case we would monitor his condition with close f/u including labs, physical exam, and surveillance CT ever 3-6 months. -He agrees to consider medical therapy while we await further studies.  -In the meantime Dr. Burr Medico will prescribe Gleevec and submit for insurance approval, plan to start at low dose 200 mg then gradually titrate to 400 mg full dose.  -will get labs today to check CBC, CMP, and iron studies  -F/u in 5-6 weeks.   2. Anemia, secondary to blood loss -He initially presented with GI bleed and symptomatic anemia, Hgb 7.7 on 02/16/18; he was transfused total 3 units with improvement to 9.2 -  Hgb began to trend down again at the time of his surgery in 03/2018; Hgb 6.6 one week post-op. Hgb up to 8.3 at discharge.  -He was prescribed oral iron therapy 1 tab daily beginning in 04/2018; iron studies not done initially  -Rechecked CBC today, Hgb improved to 11.4; iron studies pending.  -If iron studies remain low will arrange for IV Feraheme; otherwise continue oral iron therapy   3. Chronic constipation -He uses miralax and prune juice PRN -He is on chronic opioid use with oxymorphone for chronic back pain, this is contributing to constipation.  -Denies further rectal bleeding.  -I encouraged him to use laxative and stool softener routinely to maintain  regular BM, increase water intake and mobility.   4. Deconditioning  -He remains at SNF where he participates in daily PT; he requires assistance with all activities at this point. He was living in independent housing prior to hospitalization and diagnosis. I encouraged him to continue PT, eat well, and gain strength to improve nutrition and overall condition so he can tolerate gleevec better. He will try.   PLAN: -Labs today, CBC, CMP, iron studies  -Request molecular studies on path, if features indicate he would likely benefit from therapy, will recommend gleevec  -Rx: Gleevec plan to start at 200 mg daily and gradually titrate to full dose if tolerating, submit to insurance  -Lab, f/u in 5-6 weeks    Orders Placed This Encounter  Procedures  . CBC with Differential (Cancer Center Only)    Standing Status:   Standing    Number of Occurrences:   50    Standing Expiration Date:   04/22/2022  . CMP ([facility] only)    Standing Status:   Standing    Number of Occurrences:   50    Standing Expiration Date:   04/22/2022  . Iron and TIBC    Standing Status:   Standing    Number of Occurrences:   50    Standing Expiration Date:   04/22/2022  . Ferritin    Standing Status:   Standing    Number of Occurrences:   50    Standing Expiration Date:   04/22/2022    All questions were answered. The patient knows to call the clinic with any problems, questions or concerns. I spent 50 minutes counseling the patient face to face. The total time spent in the appointment was 75 minutes and more than 50% was on counseling, review of test results, and coordination of care.     Alla Feeling, NP 04/22/2018   Addendum  Mr. Paluch is a 84 yo male, previously living in assistant living, now in SNF, still frail, and has not recovered completely from his recent gastric surgery for GIST.  I reviewed his surgical pathology findings, which showed a 13 cm tumor, high mitotic rate, which predicts high risk of  recurrence (~85%).  His stating CT scan was negative for distant metastasis.  Due to the very high risk of recurrence, I recommend him to consider adjuvant Gleevec, potential benefits and side effects were discussed with him in details.  Given his advanced age and limited performance status, I recommend him to start from low-dose 229m daily and gradually titrate up to 4026mdaily if he can tolerate. I will check his c-kit mutation in his tumor, rule out TKI resistant mutations before he starts. If he does appearance significant side effects from GlDanielsonI will have low threshold to stop, given his advanced age.  This was discussed with patient and his son in great detail, both of them agree with the plan. I will call in the prescription, and plan to start in 3 weeks when his c-kit mutation returns.   Truitt Merle  04/22/2018

## 2018-04-22 NOTE — Progress Notes (Signed)
  Oncology Nurse Navigator Documentation  Navigator Location: [facility] (04/22/18 1607) Referral date to RadOnc/MedOnc: 04/07/18 (04/22/18 1607) )Navigator Encounter Type: Initial MedOnc (04/22/18 1607)  Introduced myself and the role of GI navigator to patient and his son. Patient lives in a SNF, has transportation and family support. No barriers identified. I provided my contact information to patient's son for further needs or questions.   Abnormal Finding Date: 02/16/18 (04/22/18 1607) Confirmed Diagnosis Date: 03/12/18 (04/22/18 1607) Surgery Date: 03/12/18 (04/22/18 1607)           Treatment Initiated Date: 03/12/18 (04/22/18 1607)     Barriers/Navigation Needs: No barriers at this time (04/22/18 1607)                Acuity: Level 1 (04/22/18 1607) Acuity Level 1: Minimal follow up required (04/22/18 1607)       Time Spent with Patient: 15 (04/22/18 1607)

## 2018-04-22 NOTE — Patient Instructions (Signed)
Imatinib (Gleevec) tablets What is this medicine? IMATINIB (i MAT in ib) is a medicine that targets proteins in cancer cells and stops the cancer cells from growing. It is used to treat certain leukemias, myelodysplastic syndromes, and other cancers. It is also used to treat specific digestive tract tumors called GISTs. This medicine may be used for other purposes; ask your health care provider or pharmacist if you have questions. COMMON BRAND NAME(S): Gleevec What should I tell my health care provider before I take this medicine? They need to know if you have any of these conditions: -bleeding problems -infection (especially a virus infection such as chickenpox, cold sores, or herpes) -heart disease -heart failure -kidney disease -liver disease -lung disease -stomach problems -an unusual or allergic reaction to imatinib, other medicines, foods, dyes, or preservatives -pregnant or trying to get pregnant -breast-feeding How should I use this medicine? Take this medicine by mouth with a full glass of water. Take it with food to decrease the chance of it upsetting your stomach. Do not take with grapefruit juice. Follow the directions on the prescription label. Take your medicine at regular intervals. Do not take it more often than directed. Do not stop taking except on your doctor's advice. If you have difficulty swallowing the tablets, let your doctor, pharmacist, or health care professional know. They can help you with advice. Talk to your pediatrician regarding the use of this medicine in children. While this drug may be prescribed for children as young as 1 year for selected conditions, precautions do apply. Overdosage: If you think you have taken too much of this medicine contact a poison control center or emergency room at once. NOTE: This medicine is only for you. Do not share this medicine with others. What if I miss a dose? If you miss a dose, take it as soon as you can. If it is  almost time for your next dose, take only that dose and skip your missed dose. Do not take double or extra doses. What may interact with this medicine? -antiviral medicines for HIV or AIDS -bosentan -cisapride -clarithromycin -cyclosporine -dexamethasone -diltiazem -ergot alkaloids like dihydroergotamine, ergonovine, ergotamine, methylergonovine -erythromycin -grapefruit or grapefruit juice -medicines for cholesterol like atorvastatin lovastatin, simvastatin -medicines for depression, anxiety, or psychotic disturbances -medicines for fungal infections like ketoconazole and itraconazole -medicines for irregular heart beat like amiodarone, bepridil, dofetilide, encainide, flecainide, propafenone, quinidine -medicines for seizures like carbamazepine, phenobarbital, phenytoin -medicines for sleep -NSAIDS, medicines for pain and inflammation, like ibuprofen or naproxen -pimozide -rifabutin -rifampin -sildenafil -sirolimus -St. John's wort -tacrolimus -vaccines -verapamil -warfarin Talk to your doctor or health care professional before taking any of these medicines: -acetaminophen -aspirin -ibuprofen -ketoprofen -naproxen This list may not describe all possible interactions. Give your health care provider a list of all the medicines, herbs, non-prescription drugs, or dietary supplements you use. Also tell them if you smoke, drink alcohol, or use illegal drugs. Some items may interact with your medicine. What should I watch for while using this medicine? Visit your doctor for checks on your progress. You will need to have regular blood tests while on this medicine. Report any new symptoms promptly. Call your doctor or health care professional for advice if you get a fever, chills or sore throat, or other symptoms of a cold or flu. Do not treat yourself. This drug decreases your body's ability to fight infections. Try to avoid being around people who are sick. This medicine may  increase your risk to bruise or bleed. Call  your doctor or health care professional if you notice any unusual bleeding. You may get drowsy or dizzy. Do not drive, use machinery, or do anything that needs mental alertness until you know how this medicine affects you. Do not become pregnant while taking this medicine or for 14 days after stopping it. Women should inform their doctor if they wish to become pregnant or think they might be pregnant. There is a potential for serious side effects to an unborn child. Talk to your health care professional or pharmacist for more information. Do not breast-feed an infant while taking this medicine or for 1 month after stopping it. What side effects may I notice from receiving this medicine? Side effects that you should report to your doctor or health care professional as soon as possible: -low blood counts - this medicine may decrease the number of white blood cells, red blood cells and platelets. You may be at increased risk for infections and bleeding. -signs of infection - fever or chills, cough, sore throat, pain or difficulty passing urine -signs of decreased platelets or bleeding - bruising, pinpoint red spots on the skin, black, tarry stools, blood in the urine, nosebleeds -signs of decreased red blood cells - unusually weak or tired, fainting spells, lightheadedness -allergic reactions like skin rash, itching or hives, swelling of the face, lips, or tongue -breathing problems -changes in vision -dark urine -general ill feeling or flu-like symptoms -light-colored stools -loss of appetite -mouth sores -redness, blistering, peeling or loosening of the skin, including inside the mouth -right upper belly pain -swelling of the legs or ankles -trouble passing urine or change in the amount of urine -vomiting -yellowing of the eyes or skin Side effects that usually do not require medical attention (report to your doctor or health care professional if they  continue or are bothersome): -decreased appetite -diarrhea -difficulty sleeping -headache -heartburn -joint pain -muscle cramps or pain -nausea -upset stomach This list may not describe all possible side effects. Call your doctor for medical advice about side effects. You may report side effects to FDA at 1-800-FDA-1088. Where should I keep my medicine? Keep out of reach of children. Store tablets at room temperature between 15 and 30 degrees C (59 and 86 degrees F). Protect from moisture. Keep tightly closed. Throw away any unused medicine after the expiration date. NOTE: This sheet is a summary. It may not cover all possible information. If you have questions about this medicine, talk to your doctor, pharmacist, or health care provider.  2018 Elsevier/Gold Standard (2015-08-01 15:36:35)

## 2018-04-23 ENCOUNTER — Other Ambulatory Visit (HOSPITAL_COMMUNITY)
Admission: RE | Admit: 2018-04-23 | Discharge: 2018-04-23 | Disposition: A | Discharge status: Home / Self Care | Payer: Medicare Other | Source: Ambulatory Visit | Attending: Hematology | Admitting: Hematology

## 2018-04-23 ENCOUNTER — Telehealth: Payer: Self-pay | Admitting: Pharmacist

## 2018-04-23 DIAGNOSIS — C49A2 Gastrointestinal stromal tumor of stomach: Secondary | ICD-10-CM | POA: Diagnosis present

## 2018-04-23 LAB — IRON AND TIBC
Iron: 17 ug/dL — ABNORMAL LOW (ref 42–163)
Saturation Ratios: 6 % — ABNORMAL LOW (ref 42–163)
TIBC: 279 ug/dL (ref 202–409)
UIBC: 261 ug/dL

## 2018-04-23 LAB — FERRITIN: FERRITIN: 46 ng/mL (ref 22–316)

## 2018-04-23 MED ORDER — IMATINIB MESYLATE 100 MG PO TABS: 200.0000 mg | ORAL_TABLET | Freq: Every day | ORAL | 0 refills | Status: DC

## 2018-04-23 NOTE — Addendum Note (Signed)
Addended by: Truitt Merle on: 04/23/2018 01:42 PM   Modules accepted: Orders

## 2018-04-23 NOTE — Telephone Encounter (Signed)
Oral Oncology Pharmacist Encounter  Received new referral for Gleevec (imatinib) for the treatment of newly-diagnosed, stage IIIB gastrointestinal stromal tumor with a high mitotic rate, planned duration 3-5 years of therapy.  Noted Gleevec will be initiated at '200mg'$  daily and slowly up-titrated to target dose of '400mg'$  daily  Initiation of Gleevec is pending c-kit mutational analysis to verify response to treatment with Gleevec. We will proceed with insurance authorization prior to treatment initiation.  Labs from Epic assessed, [place] for treatment. Est CrCl ~ 45 mL/min Max daily dose '600mg'$  / day, per d/w MD, Gleevec dose will not be increased past '400mg'$  daily d/t age and performance status  Patient with previous bifascicular bundle branch block 12/31/17 ECHO shoes LVEF 65-70%, noted to have vigorous systolic function Severe heart failure and left ventricular dysfunction have been reported with Gleevec use. Cardiac adverse events usually occur in patients with advanced age or comorbidities. Patient will be monitored for signs and symptoms of cardiac dysfunction.  Current medication list in Epic reviewed, moderate DDIs with Gleevec and Remeron identified due to Gleevec's moderate inhibition of CYP2D6 leading to possible decreased metabolism snd increased systemic exposure to Remeron. Patient will be monitored for increased somnolence.  Prescription will be sent to appropriate specialty pharmacy for dispensing once directed by MD.  Oral Oncology Clinic will continue to follow for insurance authorization, copayment issues, initial counseling and start date.  Johny Drilling, PharmD, BCPS, BCOP  04/23/2018 11:45 AM Oral Oncology Clinic [phone]

## 2018-04-24 ENCOUNTER — Telehealth: Payer: Self-pay | Admitting: Pharmacy Technician

## 2018-04-24 NOTE — Telephone Encounter (Signed)
Oral Oncology Patient Advocate Encounter  Prior Authorization for [employer] has been approved.    PA# [id number] Effective dates: 04/24/2018 through 12/01/2018  Oral Oncology Clinic will continue to follow.   [name]. [name], [place] Patient [place] [phone] 04/24/2018 2:52 PM

## 2018-04-24 NOTE — Telephone Encounter (Signed)
Oral Oncology Patient Advocate Encounter  Received notification from [facility] that prior authorization for [place] is required.  PA submitted on CoverMyMeds Key [id number] Status is pending  Oral Oncology Clinic will continue to follow.  [name]. [name], [name] Patient [name] [phone] 04/24/2018 9:16 AM

## 2018-05-01 MED ORDER — IMATINIB MESYLATE 100 MG PO TABS: ORAL_TABLET | ORAL | 0 refills | Status: DC

## 2018-05-01 NOTE — Telephone Encounter (Signed)
Oral Oncology Pharmacist Encounter  Received prescription for Gleevec from MD. Received insurance authorization.  Patient to be initiated on [place] on dose titration schedule to ensure tolerance.  Gleevec 134m tablets will be used for 1st 4-week supply  [facility] revealed copayment for 1st 4-week fill is $50.32  Gleevec written to take 1025mtablets, take 2 tablets (2005mby mouth once daily for weeks 1 and 2 Take 3 tablets (300m65my mouth once daily for weeks 3 and 4  Gleevec should be administered immediately after meals and with a full glass of water to decrease risk of GI irritations and nausea  If patient tolerates dose titration, Gleevec 400mg8mlets, 1 tablet by mouth once daily with be given from week 5 and onward. A new prescription for the 400mg 19mets will be sent if patient if to increase dose.  We are awaiting results of c-kit mutational analysis prior to initiation of Gleevec. I will wait to counsel patient and provide information on copayment until we are sure patient will begin therapy.  I will await direction from MD prior to counseling patient.  Oral Oncology Clinic will continue to follow.  Jesse Johny DrillingmD, BCPS, BCOP  05/01/2018 9:53 AM Oral Oncology Clinic [phone]

## 2018-05-03 ENCOUNTER — Encounter (HOSPITAL_COMMUNITY): Payer: Self-pay | Admitting: Emergency Medicine

## 2018-05-03 ENCOUNTER — Emergency Department (HOSPITAL_COMMUNITY): Payer: Medicare Other

## 2018-05-03 ENCOUNTER — Other Ambulatory Visit: Payer: Self-pay

## 2018-05-03 ENCOUNTER — Emergency Department (HOSPITAL_COMMUNITY)
Admission: EM | Admit: 2018-05-03 | Discharge: 2018-05-04 | Disposition: A | Discharge status: Home / Self Care | Payer: Medicare Other | Attending: Emergency Medicine | Admitting: Emergency Medicine

## 2018-05-03 DIAGNOSIS — Z9884 Bariatric surgery status: Secondary | ICD-10-CM | POA: Insufficient documentation

## 2018-05-03 DIAGNOSIS — Z8509 Personal history of malignant neoplasm of other digestive organs: Secondary | ICD-10-CM | POA: Insufficient documentation

## 2018-05-03 DIAGNOSIS — Z79899 Other long term (current) drug therapy: Secondary | ICD-10-CM | POA: Insufficient documentation

## 2018-05-03 DIAGNOSIS — Z9049 Acquired absence of other specified parts of digestive tract: Secondary | ICD-10-CM | POA: Insufficient documentation

## 2018-05-03 DIAGNOSIS — J690 Pneumonitis due to inhalation of food and vomit: Secondary | ICD-10-CM | POA: Diagnosis not present

## 2018-05-03 DIAGNOSIS — R1030 Lower abdominal pain, unspecified: Secondary | ICD-10-CM | POA: Diagnosis present

## 2018-05-03 DIAGNOSIS — R062 Wheezing: Secondary | ICD-10-CM

## 2018-05-03 DIAGNOSIS — N189 Chronic kidney disease, unspecified: Secondary | ICD-10-CM | POA: Diagnosis not present

## 2018-05-03 DIAGNOSIS — Z8546 Personal history of malignant neoplasm of prostate: Secondary | ICD-10-CM | POA: Insufficient documentation

## 2018-05-03 LAB — COMPREHENSIVE METABOLIC PANEL
ALBUMIN: 3.4 g/dL — AB (ref 3.5–5.0)
ALK PHOS: 76 U/L (ref 38–126)
ALT: 19 U/L (ref 17–63)
ANION GAP: 6 (ref 5–15)
AST: 29 U/L (ref 15–41)
BUN: 18 mg/dL (ref 6–20)
CALCIUM: 8.6 mg/dL — AB (ref 8.9–10.3)
CHLORIDE: 100 mmol/L — AB (ref 101–111)
CO2: 27 mmol/L (ref 22–32)
Creatinine, Ser: 0.82 mg/dL (ref 0.61–1.24)
GFR calc non Af Amer: >60 mL/min (ref >60)
GLUCOSE: 135 mg/dL — AB (ref 65–99)
POTASSIUM: 4.6 mmol/L (ref 3.5–5.1)
SODIUM: 133 mmol/L — AB (ref 135–145)
Total Bilirubin: 0.5 mg/dL (ref 0.3–1.2)
Total Protein: 6.7 g/dL (ref 6.5–8.1)

## 2018-05-03 LAB — I-STAT TROPONIN, ED: TROPONIN I, POC: 0 ng/mL (ref 0.00–0.08)

## 2018-05-03 LAB — CBC
HEMATOCRIT: 37.2 % — AB (ref 39.0–52.0)
Hemoglobin: 11.1 g/dL — ABNORMAL LOW (ref 13.0–17.0)
MCH: 24.7 pg — AB (ref 26.0–34.0)
MCHC: 29.8 g/dL — ABNORMAL LOW (ref 30.0–36.0)
MCV: 82.7 fL (ref 78.0–100.0)
Platelets: 250 10*3/uL (ref 150–400)
RBC: 4.5 MIL/uL (ref 4.22–5.81)
RDW: 17.4 % — ABNORMAL HIGH (ref 11.5–15.5)
WBC: 8.2 10*3/uL (ref 4.0–10.5)

## 2018-05-03 LAB — LIPASE, BLOOD: LIPASE: 26 U/L (ref 11–51)

## 2018-05-03 MED ORDER — MORPHINE SULFATE (PF) 4 MG/ML IV SOLN
4.0000 mg | Freq: Once | INTRAVENOUS | Status: AC
  Administered 2018-05-03: 4 mg via INTRAVENOUS
  Filled 2018-05-03: qty 1

## 2018-05-03 MED ORDER — ALBUTEROL SULFATE (2.5 MG/3ML) 0.083% IN NEBU
5.0000 mg | INHALATION_SOLUTION | Freq: Once | RESPIRATORY_TRACT | Status: AC
  Administered 2018-05-03: 5 mg via RESPIRATORY_TRACT
  Filled 2018-05-03: qty 6

## 2018-05-03 MED ORDER — IOPAMIDOL (ISOVUE-370) INJECTION 76%
100.0000 mL | Freq: Once | INTRAVENOUS | Status: AC | PRN
  Administered 2018-05-03: 100 mL via INTRAVENOUS

## 2018-05-03 MED ORDER — ONDANSETRON HCL 4 MG/2ML IJ SOLN
4.0000 mg | Freq: Once | INTRAMUSCULAR | Status: AC
  Administered 2018-05-03: 4 mg via INTRAVENOUS
  Filled 2018-05-03: qty 2

## 2018-05-03 NOTE — ED Provider Notes (Addendum)
[place] EMERGENCY DEPARTMENT Provider Note   CSN: [id number] Arrival date & time: 05/03/18  1951     History   Chief Complaint Chief Complaint  Patient presents with  . Abdominal Pain  . Chest Pain    HPI Casey Gates is a 84 y.o. male.  Patient is an 84 year old male with a history of prior multifocal pneumonia, asthma, chronic kidney disease, GI bleed and recent surgery in April for gastrointestinal stromal tumor who is currently at rehab presenting today with the pain started he said approximately 48 hours ago.  Started as a sharp pain initially across his lower abdomen that is moved up into the left side of his chest.  The pain is worse with taking deep breaths, coughing and certain movements.  He also states yesterday he developed a mild cough with some sputum production.  He continues to take his pain medication but states at times the pain will get to be an 8 out of 10.  He denies any bowel or bladder changes.  He has had no fever nausea or vomiting.  He states this feels like it did last time he had pneumonia.  However he has noticed some wheezing today but denies any shortness of breath.  He does not use inhalers regularly.  The history is provided by the patient.  Abdominal Pain   This is a new problem. The current episode started yesterday. The problem occurs constantly. The problem has been gradually worsening. The pain is associated with an unknown factor. The pain is located in the LUQ and LLQ (now in the left side of the chest). The quality of the pain is shooting and sharp. The pain is at a severity of 7/10. The pain is moderate. Associated symptoms include flatus. Pertinent negatives include anorexia, fever, diarrhea, melena, nausea, vomiting, dysuria and frequency. Associated symptoms comments: Cough started yesterday with some mild mucous.  No SOB. The symptoms are aggravated by deep breathing and activity. Nothing relieves the symptoms.  Chest Pain     Associated symptoms include abdominal pain. Pertinent negatives include no fever, no nausea and no vomiting.    Past Medical History:  Diagnosis Date  . Anemia   . Asthma    AS A CHILD  . Blind left eye   . Cancer [facility])    PROSTATE CANCER  . Cellulitis    frequently, "all over"  . Chronic kidney disease   . Chronic pain syndrome   . Constipation   . DDD (degenerative disc disease), lumbar   . Gastrointestinal stromal tumor (GIST) of stomach ([place])   . GI bleeding   . History of blood transfusion    01/2018  . Insomnia   . Kyphoscoliosis   . Mass in the abdomen 01/2018  . Restless leg   . Sleep apnea    does not wear CPAP  . Wears glasses     Patient Active Problem List   Diagnosis Date Noted  . Multifocal pneumonia 03/18/2018  . Sepsis ([place]) 03/18/2018  . Malignant gastrointestinal stromal tumor (GIST) of stomach ([place]) 03/12/2018  . Urinary retention 02/24/2018  . Gastrointestinal stromal tumor (GIST) of stomach ([place])   . Restless leg syndrome   . Acute GI bleeding 02/16/2018  . Acute blood loss anemia 02/16/2018  . Constipation 01/20/2018  . Exertional shortness of breath 12/30/2017  . Rupture of left quadriceps muscle 07/07/2017  . Rupture of left quadriceps tendon 06/26/2017  . Eczema 06/26/2017  . Chest pain 07/29/2015  .  OSA on CPAP 07/10/2015  . CPAP use counseling 07/10/2015  . Excessive daytime sleepiness 02/22/2015  . Sleep apnea 02/22/2015  . Snoring 02/22/2015  . Nocturia more than twice per night 02/22/2015  . Syncope 12/31/2012  . Bifascicular bundle branch block 12/31/2012    Class: Acute  . Cellulitis and abscess of leg, except foot 12/04/2012  . Chronic pain 12/04/2012  . Kyphosis 12/04/2012  . H/O unilateral nephrectomy 12/04/2012  . H/O prostate cancer 12/04/2012  . S/P TURP 12/04/2012    Past Surgical History:  Procedure Laterality Date  . CHOLECYSTECTOMY    . COLONOSCOPY    . ESOPHAGOGASTRODUODENOSCOPY (EGD) WITH PROPOFOL Left  02/16/2018   Procedure: ESOPHAGOGASTRODUODENOSCOPY (EGD) WITH PROPOFOL;  Surgeon: [name], MD;  Location: [facility];  Service: Gastroenterology;  Laterality: Left;  . LAPAROSCOPIC GASTRECTOMY N/A 03/12/2018   Procedure: LAPAROSCOPIC ASSISTED PARTIAL GASTRECTOMY ERAS PATHWAY;  Surgeon: Stark Klein, MD;  Location: [facility];  Service: General;  Laterality: N/A;  . LAPAROSCOPIC PARTIAL GASTRECTOMY  03/12/2018   LAPAROSCOPIC ASSISTED PARTIAL GASTRECTOMY ERAS PATHWAY  . PROSTATECTOMY    . QUADRICEPS TENDON REPAIR Left 07/07/2017   Procedure: REPAIR QUADRICEP TENDON;  Surgeon: Rod Can, MD;  Location: [place];  Service: Orthopedics;  Laterality: Left;  . RIGHT CATARACT EXTRACTION  2013  . RIGTH NEPHRECTOMY  2006  . TONSILLECTOMY    . TRANSURETHRAL RESECTION OF PROSTATE  1997        Home Medications    Prior to Admission medications   Medication Sig Start Date End Date Taking? Authorizing Provider  acetaminophen (TYLENOL) 650 MG suppository Place 1 suppository (650 mg total) rectally every 4 (four) hours as needed for fever. Patient not taking: Reported on 04/22/2018 03/24/18   Katherine Roan, MD  cetaphil (CETAPHIL) lotion Apply 1 application topically 4 (four) times daily as needed (FOR DRY SKIN.). Patient not taking: Reported on 04/22/2018 03/24/18   Katherine Roan, MD  feeding supplement, GLUCERNA SHAKE, (GLUCERNA SHAKE) LIQD Take 237 mLs by mouth 2 (two) times daily between meals. Patient not taking: Reported on 04/22/2018 03/24/18   Katherine Roan, MD  ferrous sulfate 325 (65 FE) MG EC tablet Take 325 mg by mouth daily with breakfast.    [provider]  gabapentin (NEURONTIN) 300 MG capsule Take 1 capsule (300 mg total) by mouth 3 (three) times daily. 03/24/18   Katherine Roan, MD  guaiFENesin (ROBITUSSIN) 100 MG/5ML SOLN Take 5 mLs (100 mg total) by mouth every 4 (four) hours as needed for cough or to loosen phlegm. Patient not taking: Reported on 04/22/2018  03/24/18   Katherine Roan, MD  imatinib (GLEEVEC) 100 MG tablet Week 1 & 2: Take 2 tabs (100mg ) by mouth daily. Week 3 & 4: Take 3 tab (300mg ) daily. Take immediately after meals with large glass of water 05/01/18   Truitt Merle, MD  ipratropium-albuterol (DUONEB) 0.5-2.5 (3) MG/3ML SOLN Take 3 mLs by nebulization every 6 (six) hours as needed.    [provider]  loperamide (IMODIUM) 2 MG capsule Take 1 capsule (2 mg total) by mouth as needed for diarrhea or loose stools. 03/24/18   Katherine Roan, MD  Melatonin 3 MG TABS Take 1 tablet (3 mg total) by mouth at bedtime. Patient not taking: Reported on 04/22/2018 03/24/18   Katherine Roan, MD  methocarbamol (ROBAXIN) 500 MG tablet Take 1 tablet (500 mg total) by mouth every 8 (eight) hours as needed for muscle spasms. 03/24/18   Winfrey,  Jenne Pane, MD  mirtazapine (REMERON) 7.5 MG tablet Take 7.5 mg by mouth at bedtime.    [provider]  Multiple Vitamin (MULTIVITAMIN WITH MINERALS) TABS tablet Take 1 tablet by mouth daily. 03/25/18   Katherine Roan, MD  oxymorphone (OPANA ER) 10 MG 12 hr tablet Take 10 mg by mouth 4 (four) times daily as needed for pain.    [provider]  pantoprazole (PROTONIX) 40 MG tablet Take 1 tablet (40 mg total) by mouth daily. Patient not taking: Reported on 04/22/2018 03/25/18   Katherine Roan, MD  polyethylene glycol [facility] Floria Raveling) packet Take 17 g by mouth daily.    [provider]  polyvinyl alcohol (LIQUIFILM TEARS) 1.4 % ophthalmic solution Place 1-2 drops into both eyes 3 (three) times daily as needed (FOR DRY EYES.). Patient not taking: Reported on 04/22/2018 03/24/18   Katherine Roan, MD  protein supplement shake (PREMIER PROTEIN) LIQD Take 325 mLs (11 oz total) by mouth 2 (two) times daily between meals. Patient not taking: Reported on 04/22/2018 03/24/18   Katherine Roan, MD  senna-docusate (SENOKOT-S) 8.6-50 MG tablet Take 1 tablet by mouth at bedtime as  needed for mild constipation. Patient not taking: Reported on 04/22/2018 03/24/18   Katherine Roan, MD    Family History Family History  Problem Relation Age of Onset  . Bone cancer Mother   . Hypertension Mother   . Arthritis Mother   . Clotting disorder Daughter        died of blood clot     Social History Social History   Tobacco Use  . Smoking status: Never Smoker  . Smokeless tobacco: Never Used  Substance Use Topics  . Alcohol use: No    Alcohol/week: 0.0 oz    Comment: quit 1985, 2 fifths/ week,S/P rehab  . Drug use: No     Allergies   Coconut flavor [flavoring agent]; Coconut oil; and Food   Review of Systems Review of Systems  Constitutional: Negative for fever.  Cardiovascular: Positive for chest pain.  Gastrointestinal: Positive for abdominal pain and flatus. Negative for anorexia, diarrhea, melena, nausea and vomiting.  Genitourinary: Negative for dysuria and frequency.  All other systems reviewed and are negative.    Physical Exam Updated Vital Signs BP (!) 151/82 (BP Location: Left Arm)   Pulse 68   Temp 98.6 F (37 C) (Oral)   Resp 18   Ht 5\' 9"  (1.753 m)   Wt 66.2 kg (146 lb)   SpO2 100%   BMI 21.56 kg/m   Physical Exam  Constitutional: He is oriented to person, place, and time. He appears well-developed and well-nourished. No distress.  HENT:  Head: Normocephalic and atraumatic.  Mouth/Throat: Oropharynx is clear and moist.  Eyes: Pupils are equal, round, and reactive to light. Conjunctivae and EOM are normal.  Neck: Normal range of motion. Neck supple.  Cardiovascular: Normal rate, regular rhythm and intact distal pulses.  No murmur heard. Pulmonary/Chest: Effort normal. No respiratory distress. He has wheezes. He has no rales.  Abdominal: Soft. He exhibits no distension. There is no tenderness. There is no rebound and no guarding.  Healing midline surgical scar in the abdomen.  No drainage or sign of wound infection.  No focal  abdominal tenderness  Musculoskeletal: Normal range of motion. He exhibits no edema or tenderness.  Neurological: He is alert and oriented to person, place, and time.  Skin: Skin is warm and dry. No rash noted. No erythema.  Psychiatric: He has a normal mood and affect. His behavior is normal.  Nursing note and vitals reviewed.    ED Treatments / Results  Labs (all labs ordered are listed, but only abnormal results are displayed) Labs Reviewed  COMPREHENSIVE METABOLIC PANEL - Abnormal; Notable for the following components:      Result Value   Sodium 133 (*)    Chloride 100 (*)    Glucose, Bld 135 (*)    Calcium 8.6 (*)    Albumin 3.4 (*)    All other components within normal limits  CBC - Abnormal; Notable for the following components:   Hemoglobin 11.1 (*)    HCT 37.2 (*)    MCH 24.7 (*)    MCHC 29.8 (*)    RDW 17.4 (*)    All other components within normal limits  LIPASE, BLOOD  URINALYSIS, ROUTINE W REFLEX MICROSCOPIC  I-STAT TROPONIN, ED    EKG EKG Interpretation  Date/Time:  Sunday May 03 2018 78:29:56 EDT Ventricular Rate:  67 PR Interval:  162 QRS Duration: 124 QT Interval:  446 QTC Calculation: 471 R Axis:   -71 Text Interpretation:  Sinus rhythm with Fusion complexes Right bundle branch block Left anterior fascicular block Septal infarct , age undetermined No significant change since last tracing Reconfirmed by Blanchie Dessert [phone]) on 05/03/2018 9:50:41 PM   Radiology Dg Chest 2 View  Result Date: 05/03/2018 CLINICAL DATA:  Cough and left-sided chest pain EXAM: CHEST - 2 VIEW COMPARISON:  03/22/2018 FINDINGS: Cardiac shadow is stable. Previously seen patchy airspace disease in the right lung has resolved in the interval. No sizable effusion is seen. No bony is noted. IMPRESSION: No acute abnormality seen. Electronically Signed   By: Inez Catalina M.D.   On: 05/03/2018 21:15    Procedures Procedures (including critical care time)  Medications Ordered  in ED Medications  ondansetron (ZOFRAN) injection 4 mg (has no administration in time range)  morphine 4 MG/ML injection 4 mg (has no administration in time range)     Initial Impression / Assessment and Plan / ED Course  I have reviewed the triage vital signs and the nursing notes.  Pertinent labs & imaging results that were available during my care of the patient were reviewed by me and considered in my medical decision making (see chart for details).  Clinical Course as of May 05 2111  Sun May 03, 2018  2030 Plan: CT scans pending   [HM]    Clinical Course User Index [HM] Muthersbaugh, Gwenlyn Perking    Elderly male presenting today with vague pain that started in his abdomen and moved to the left side of his chest.  Patient is wheezing on exam and has had a mild productive cough.  He denies any nausea vomiting or diarrhea.  His surgical incision appears to be healing well.  With patient's nonspecific pain concern for potential acute abdominal pathology and complication from recent surgery versus kidney stone versus PE versus pneumonia.  Lower suspicion for cardiac etiology as patient's EKG is unchanged and his troponin is within normal limits.  Patient's CBC shows a stable hemoglobin of 11.  CMP, lipase, chest x-ray pending.  Patient given pain control.  9:49 PM CXR without acute findings and CMP unchanged and lipase wnl.  Will get CTA of chest to r/o PE and Ct of abd to r/o acute pathology from recent surgery as the cause of pain.  Final Clinical Impressions(s) / ED Diagnoses   Final diagnoses:  Aspiration  pneumonia of right lower lobe, unspecified aspiration pneumonia type [facility])  Wheezing    ED Discharge Orders    None       Blanchie Dessert, MD 05/03/18 2150    Blanchie Dessert, MD 05/04/18 2112

## 2018-05-03 NOTE — ED Triage Notes (Addendum)
Pt reports sharp lower abd pain that started yesterday and radiates up L lateral side.  Pain worse with movement.  Also reports productive cough- unsure of color.  States abd pain has now resolved.  Mild sob.  History of pneumonia in April.  Pt stays at [facility].

## 2018-05-03 NOTE — ED Notes (Signed)
Pt in x-ray at this time

## 2018-05-04 MED ORDER — DOXYCYCLINE HYCLATE 100 MG PO TABS
100.0000 mg | ORAL_TABLET | Freq: Once | ORAL | Status: DC
  Filled 2018-05-04: qty 1

## 2018-05-04 MED ORDER — LEVOFLOXACIN 750 MG PO TABS
750.0000 mg | ORAL_TABLET | Freq: Once | ORAL | Status: AC
  Administered 2018-05-04: 750 mg via ORAL
  Filled 2018-05-04: qty 1

## 2018-05-04 MED ORDER — ALBUTEROL SULFATE HFA 108 (90 BASE) MCG/ACT IN AERS
2.0000 | INHALATION_SPRAY | RESPIRATORY_TRACT | Status: DC | PRN
  Administered 2018-05-04: 2 via RESPIRATORY_TRACT
  Filled 2018-05-04: qty 6.7

## 2018-05-04 MED ORDER — LEVOFLOXACIN 750 MG PO TABS: 750.0000 mg | ORAL_TABLET | Freq: Every day | ORAL | 0 refills | Status: AC

## 2018-05-04 MED ORDER — AEROCHAMBER PLUS FLO-VU LARGE MISC
1.0000 | Freq: Once | Status: AC
  Administered 2018-05-04: 1

## 2018-05-04 MED ORDER — AEROCHAMBER PLUS FLO-VU LARGE MISC
Status: AC
  Filled 2018-05-04: qty 1

## 2018-05-04 NOTE — ED Provider Notes (Signed)
Care assumed from Blanchie Dessert, MD.  Please see her full H&P.  In short,  Casey Gates is a 84 y.o. male with a history of multifocal pneumonia, asthma, chronic kidney disease, GI bleed and recent surgery in April for gastrointestinal stromal tumor presents for abdominal and left-sided chest pain.  Pain is worse with deep breaths and coughing.  Patient reports symptoms feel like the last time he had pneumonia.  She had wheezing on providers initial exam.  Physical Exam  BP (!) 119/59   Pulse 73   Temp 98.6 F (37 C) (Oral)   Resp 20   Ht 5\' 9"  (1.753 m)   Wt 66.2 kg (146 lb)   SpO2 96%   BMI 21.56 kg/m   Physical Exam  Constitutional: He appears well-developed and well-nourished. No distress.  HENT:  Head: Normocephalic.  Eyes: Conjunctivae are normal. No scleral icterus.  Neck: Normal range of motion.  Cardiovascular: Normal rate and intact distal pulses.  Pulmonary/Chest: Effort normal.  Musculoskeletal: Normal range of motion.  Neurological: He is alert.  Skin: Skin is warm and dry.  Nursing note and vitals reviewed.   ED Course/Procedures   Results for orders placed or performed during the hospital encounter of 05/03/18  Lipase, blood  Result Value Ref Range   Lipase 26 11 - 51 U/L  Comprehensive metabolic panel  Result Value Ref Range   Sodium 133 (L) 135 - 145 mmol/L   Potassium 4.6 3.5 - 5.1 mmol/L   Chloride 100 (L) 101 - 111 mmol/L   CO2 27 22 - 32 mmol/L   Glucose, Bld 135 (H) 65 - 99 mg/dL   BUN 18 6 - 20 mg/dL   Creatinine, Ser 0.82 0.61 - 1.24 mg/dL   Calcium 8.6 (L) 8.9 - 10.3 mg/dL   Total Protein 6.7 6.5 - 8.1 g/dL   Albumin 3.4 (L) 3.5 - 5.0 g/dL   AST 29 15 - 41 U/L   ALT 19 17 - 63 U/L   Alkaline Phosphatase 76 38 - 126 U/L   Total Bilirubin 0.5 0.3 - 1.2 mg/dL   GFR calc non Af Amer >60 >60 mL/min   GFR calc Af Amer >60 >60 mL/min   Anion gap 6 5 - 15  CBC  Result Value Ref Range   WBC 8.2 4.0 - 10.5 K/uL   RBC 4.50 4.22 - 5.81 MIL/uL    Hemoglobin 11.1 (L) 13.0 - 17.0 g/dL   HCT 37.2 (L) 39.0 - 52.0 %   MCV 82.7 78.0 - 100.0 fL   MCH 24.7 (L) 26.0 - 34.0 pg   MCHC 29.8 (L) 30.0 - 36.0 g/dL   RDW 17.4 (H) 11.5 - 15.5 %   Platelets 250 150 - 400 K/uL  I-stat troponin, ED  Result Value Ref Range   Troponin i, poc 0.00 0.00 - 0.08 ng/mL   Comment 3           Dg Chest 2 View  Result Date: 05/03/2018 CLINICAL DATA:  Cough and left-sided chest pain EXAM: CHEST - 2 VIEW COMPARISON:  03/22/2018 FINDINGS: Cardiac shadow is stable. Previously seen patchy airspace disease in the right lung has resolved in the interval. No sizable effusion is seen. No bony is noted. IMPRESSION: No acute abnormality seen. Electronically Signed   By: Inez Catalina M.D.   On: 05/03/2018 21:15   Ct Angio Chest Pe W And/or Wo Contrast  Result Date: 05/03/2018 CLINICAL DATA:  Shortness of breath. Generalized abdominal pain. Recent abdominal  surgery. EXAM: CT ANGIOGRAPHY CHEST CT ABDOMEN AND PELVIS WITH CONTRAST TECHNIQUE: Multidetector CT imaging of the chest was performed using the standard protocol during bolus administration of intravenous contrast. Multiplanar CT image reconstructions and MIPs were obtained to evaluate the vascular anatomy. Multidetector CT imaging of the abdomen and pelvis was performed using the standard protocol during bolus administration of intravenous contrast. CONTRAST:  152mL ISOVUE-370 IOPAMIDOL (ISOVUE-370) INJECTION 76% COMPARISON:  Chest, abdomen, pelvis CT 03/18/2018. Abdominal radiograph earlier this day FINDINGS: CTA CHEST FINDINGS Cardiovascular: There are no filling defects within the pulmonary arteries to suggest pulmonary embolus. Tortuous atherosclerotic thoracic aorta without dissection. Coronary artery calcifications or stents. No pericardial effusion. Mediastinum/Nodes: Small mediastinal lymph nodes not enlarged by size criteria. Upper esophagus minimally patulous. Visualized thyroid gland is normal. Lungs/Pleura: Debris  within the right lower lobe bronchus leads to short-segment bronchial filling and associated right lower lobe opacities. Left lower lobe opacities with bronchiectasis, with slight improvement from prior exam. Few patchy and ground-glass opacities in the right upper lobe, with improvement from prior exam. Right pleural effusion has diminished, small volume of pleural fluid persists. Few calcifications in the consolidated right lower lobe appear chronic. Musculoskeletal: Scoliosis and degenerative change in the spine. There are no acute or suspicious osseous abnormalities. Review of the MIP images confirms the above findings. CT ABDOMEN and PELVIS FINDINGS Hepatobiliary: No focal hepatic lesion. Clips in the gallbladder fossa postcholecystectomy. No biliary dilatation. Pancreas: Parenchymal atrophy. No ductal dilatation or inflammation. Spleen: Scattered calcified granuloma.  Normal in size. Adrenals/Urinary Tract: Prior right nephrectomy. Normal right adrenal gland. Low-density 16 mm left adrenal nodule. No left hydronephrosis. Parapelvic and simple cysts in the left kidney. Urinary bladder is partially distended, no bladder wall thickening. Stomach/Bowel: Partial gastrectomy, stomach physiologically distended. No evidence of bowel obstruction, wall thickening or inflammatory change. Moderate colonic stool burden. Mild diverticulosis of the sigmoid colon without diverticulitis. Appendix is not confidently visualized on the current exam. No findings to suggest appendicitis. Vascular/Lymphatic: Aortic atherosclerosis and tortuosity. No aneurysm. Mesenteric vessels are patent. No enlarged abdominal or pelvic lymph nodes, paucity of intra-abdominal fat partially limits assessment. Reproductive: Prior prostatectomy. Other: No abscess, free air free fluid. Prior free air in the abdomen has resolved. Postsurgical change of the anterior abdominal wall. Musculoskeletal: Scoliosis and multilevel degenerative change throughout  spine. No acute osseous abnormality. Review of the MIP images confirms the above findings. IMPRESSION: CTA chest: 1. No pulmonary embolus. 2. Debris within the right lower lobe bronchus with associated right lower lobe opacity, this can be seen with aspiration. 3. Improved right upper lobe aeration from prior consistent with resolving pneumonia. Near completely resolved right pleural effusion. 4. Chronic bilateral lower lobe bronchiectasis. Bilateral lower lobe airspace disease with some improvement from most recent prior. 5. Coronary artery calcifications. Aortic Atherosclerosis (ICD10-I70.0). CT abdomen/pelvis: 1. Large colonic stool burden, suggesting constipation. No bowel obstruction. 2. Post recent partial gastrectomy without evident complication. No intra-abdominal abscess. 3. Additional stable chronic findings as described. Aortic Atherosclerosis (ICD10-I70.0). Electronically Signed   By: Jeb Levering M.D.   On: 05/03/2018 23:07   Ct Abdomen Pelvis W Contrast  Result Date: 05/03/2018 CLINICAL DATA:  Shortness of breath. Generalized abdominal pain. Recent abdominal surgery. EXAM: CT ANGIOGRAPHY CHEST CT ABDOMEN AND PELVIS WITH CONTRAST TECHNIQUE: Multidetector CT imaging of the chest was performed using the standard protocol during bolus administration of intravenous contrast. Multiplanar CT image reconstructions and MIPs were obtained to evaluate the vascular anatomy. Multidetector CT imaging of the abdomen and  pelvis was performed using the standard protocol during bolus administration of intravenous contrast. CONTRAST:  170mL ISOVUE-370 IOPAMIDOL (ISOVUE-370) INJECTION 76% COMPARISON:  Chest, abdomen, pelvis CT 03/18/2018. Abdominal radiograph earlier this day FINDINGS: CTA CHEST FINDINGS Cardiovascular: There are no filling defects within the pulmonary arteries to suggest pulmonary embolus. Tortuous atherosclerotic thoracic aorta without dissection. Coronary artery calcifications or stents. No  pericardial effusion. Mediastinum/Nodes: Small mediastinal lymph nodes not enlarged by size criteria. Upper esophagus minimally patulous. Visualized thyroid gland is normal. Lungs/Pleura: Debris within the right lower lobe bronchus leads to short-segment bronchial filling and associated right lower lobe opacities. Left lower lobe opacities with bronchiectasis, with slight improvement from prior exam. Few patchy and ground-glass opacities in the right upper lobe, with improvement from prior exam. Right pleural effusion has diminished, small volume of pleural fluid persists. Few calcifications in the consolidated right lower lobe appear chronic. Musculoskeletal: Scoliosis and degenerative change in the spine. There are no acute or suspicious osseous abnormalities. Review of the MIP images confirms the above findings. CT ABDOMEN and PELVIS FINDINGS Hepatobiliary: No focal hepatic lesion. Clips in the gallbladder fossa postcholecystectomy. No biliary dilatation. Pancreas: Parenchymal atrophy. No ductal dilatation or inflammation. Spleen: Scattered calcified granuloma.  Normal in size. Adrenals/Urinary Tract: Prior right nephrectomy. Normal right adrenal gland. Low-density 16 mm left adrenal nodule. No left hydronephrosis. Parapelvic and simple cysts in the left kidney. Urinary bladder is partially distended, no bladder wall thickening. Stomach/Bowel: Partial gastrectomy, stomach physiologically distended. No evidence of bowel obstruction, wall thickening or inflammatory change. Moderate colonic stool burden. Mild diverticulosis of the sigmoid colon without diverticulitis. Appendix is not confidently visualized on the current exam. No findings to suggest appendicitis. Vascular/Lymphatic: Aortic atherosclerosis and tortuosity. No aneurysm. Mesenteric vessels are patent. No enlarged abdominal or pelvic lymph nodes, paucity of intra-abdominal fat partially limits assessment. Reproductive: Prior prostatectomy. Other: No  abscess, free air free fluid. Prior free air in the abdomen has resolved. Postsurgical change of the anterior abdominal wall. Musculoskeletal: Scoliosis and multilevel degenerative change throughout spine. No acute osseous abnormality. Review of the MIP images confirms the above findings. IMPRESSION: CTA chest: 1. No pulmonary embolus. 2. Debris within the right lower lobe bronchus with associated right lower lobe opacity, this can be seen with aspiration. 3. Improved right upper lobe aeration from prior consistent with resolving pneumonia. Near completely resolved right pleural effusion. 4. Chronic bilateral lower lobe bronchiectasis. Bilateral lower lobe airspace disease with some improvement from most recent prior. 5. Coronary artery calcifications. Aortic Atherosclerosis (ICD10-I70.0). CT abdomen/pelvis: 1. Large colonic stool burden, suggesting constipation. No bowel obstruction. 2. Post recent partial gastrectomy without evident complication. No intra-abdominal abscess. 3. Additional stable chronic findings as described. Aortic Atherosclerosis (ICD10-I70.0). Electronically Signed   By: Jeb Levering M.D.   On: 05/03/2018 23:07     Clinical Course as of May 05 23  Sun May 03, 2018  2030 Plan: CT scans pending   [HM]    Clinical Course User Index [HM] Nashali Ditmer, Jarrett Soho, PA-C    Procedures  MDM   CT scan of the chest shows no evidence of pulmonary embolism but does show debris within the right lower lobe bronchus and associated right lower lobe opacity, likely aspiration pneumonia.  CT of the abdomen shows large colonic stool burden but no bowel obstruction.  No evidence of intra-abdominal abscess from partial gastrectomy.  I personally evaluated these images.  Patient was recently taking doxycycline for skin infection, completing that course today.  Suspect this will not cover  his aspiration pneumonia due to the fact that he has been on it.  Will give Levaquin.  First dose given here in  the emergency department today.  Patient and son at bedside to return immediately to the emergency department for shortness of breath, dyspnea on exertion, fevers or any other concerns.  They state understanding and are in agreement with the plan.    Aspiration pneumonia of right lower lobe, unspecified aspiration pneumonia type [facility])  Wheezing      Cyra Spader, Gwenlyn Perking 05/04/18 Raford Pitcher, MD 05/04/18 2104

## 2018-05-04 NOTE — Discharge Instructions (Addendum)
1. Medications: Levaquin, Albuterol, usual home medications 2. Treatment: rest, drink plenty of fluids,  3. Follow Up: Please followup with your primary doctor in 2-3 days for discussion of your diagnoses and further evaluation after today's visit; if you do not have a primary care doctor use the resource guide provided to find one; Please return to the ER for fever, difficulty breathing, weakness or other concerns

## 2018-05-12 ENCOUNTER — Ambulatory Visit: Payer: Medicare Other | Admitting: Interventional Cardiology

## 2018-05-13 ENCOUNTER — Telehealth: Payer: Self-pay | Admitting: Pharmacist

## 2018-05-13 NOTE — Telephone Encounter (Signed)
Oral Chemotherapy Pharmacist Encounter   I spoke with patient's son, Casey Gates, for overview of [name] for the treatment of newly-diagnosed, stage IIIB gastrointestinal stromal tumor with a high mitotic rate, planned duration 3-5 years of therapy.  C-kit mutational analysis has returned showing that patient would experience benefit from treatment with [name].  Casey Gates states last dose of levofloxacin for aspiration pneumonia was taken this morning. He states his father is recovered and doing very well.  Counseled on administration, dosing, side effects, monitoring, drug-food interactions, safe handling, storage, and disposal.  Noted Gleevec will be initiated at 241m daily and slowly up-titrated to target dose of 4074mdaily  Weeks 1 and 2: Patient will take Gleevec 10025mablets, 2 tablets (200m46my mouth once daily with a meal and a large glass of water.  If tolerated, Weeks 3 and 4: Patient will take Gleevec 100mg67mlets, 3 tablets (300mg)62mmouth once daily with a meal and a large glass of water.  If tolerated, Week 5 and onward: Patient will take Gleevec 400mg t48mts, 1 tablets (400mg) b17muth once daily with a meal and a large glass of water.  Food may decrease stomach irritation and patient should maintain hydration while on treatment with Gleevec.  Jim counClair Gullinged that patient should avoid grapefruit and grapefruit juice.  Gleevec start date: 05/26/18  Adverse effects include but are not limited to: nausea, vomiting, diarrhea, fatigue, muscle cramps, lower extremity edema, rash, decreased blood counts, GI bleeding, and cardiac dysfunction.  Patient has anti-emetic on hand and knows to take it if nausea develops.   Patient will obtain anti diarrheal and alert the office of 4 or more loose stools above baseline.  Reviewed with patient importance of keeping a medication schedule and plan for any missed doses.  Jim voicClair Gullingunderstanding and appreciation.   All questions  answered. Medication reconciliation performed and medication/allergy list updated.  [name] [facility] We discussed the speciality pharmacy process. They will pick it up after office visit with Dr. Feng thaBurr Medicoy and start the following day.  Copayment for 1st fill $50.32, this is affordable to them at this time. They know to alert the office if copayment becomes an issue in the future.  Patient knows to call the office with questions or concerns. Oral Oncology Clinic will continue to follow.  Jim was Casey Gates able to take down my number when we were speaking. I attempted  X 3 to leave him a message with oral oncology clinic phone number, [name] ([phone], however, voicemail was full and I was not able to leave this message.  Thank you,  Jesse MaJohny Drilling, BCPS, BCOP  05/13/2018   11:04 AM Oral Oncology Clinic [phone]

## 2018-05-14 ENCOUNTER — Telehealth: Payer: Self-pay | Admitting: Pharmacist

## 2018-05-14 NOTE — Telephone Encounter (Signed)
Oral Oncology Pharmacist Encounter  I was able to leave voicemail for patient's son, [name], due to not being able to LVM yesterday. I LVM with:  Direct number to oral oncology clinic [phone])  Information that [place] will be made ready for pick up on 05/2018  Dispensing pharmacy is the [facility] (address: [name], [place])  Copayment for 1st fill $50.32  Oral Oncology Clinic will continue to follow.  Johny Drilling, PharmD, BCPS, BCOP  05/14/2018 12:17 PM Oral Oncology Clinic [phone]

## 2018-05-21 MED FILL — IMATINIB MESYLATE 100 MG TA: 100 | 28 days supply | Qty: 70 | Fill #0

## 2018-05-23 NOTE — Progress Notes (Signed)
[place]  [phone] [phone]  Clinic Follow Up Note   Patient Care Team: Velna Hatchet, MD as PCP - General (Internal Medicine) [name], MD as PCP - Cardiology (Cardiology) 05/25/2018  CHIEF COMPLAINTS:  Follow Up gastric GIST    SUMMARY OF ONCOLOGY HISTORY  Oncology History   Cancer Staging Malignant gastrointestinal stromal tumor (GIST) of stomach ([place]) Staging form: Gastrointestinal Stromal Tumor - Gastric and Omental GIST, AJCC 8th Edition - Pathologic stage from 04/22/2018: Stage IIIB (pT4, pN0, cM0, Mitotic Rate: High) - Signed by Alla Feeling, NP on 04/22/2018       Malignant gastrointestinal stromal tumor (GIST) of stomach ([place])   02/16/2018 Procedure    Upper EGD per Dr. [name] Impression:  - Normal upper third of esophagus and middle third of esophagus. - 5 cm hiatal hernia. - Widely patent Schatzki ring. - Clotted blood in the gastric body. - Rule out malignancy, gastric tumor on the lesser curvature of the stomach. Injected. Clips were placed. - Normal examined duodenum. - No specimens collected.      02/17/2018 Imaging    CT AP IMPRESSION: 1. 9.7 x 8.8 x 10.9 cm aggressive appearing heterogeneously enhancing mass along the lesser curvature of the stomach, highly concerning for primary gastric neoplasm. This is intimately associated with the undersurface of the left lobe of the liver without definitive evidence of direct invasion (although superficial involvement of the hepatic capsule is suspected given the loss of the intervening fat plane). There is also an indeterminate left adrenal nodule measuring 2.0 x 1.4 cm, which could be metastatic. No other definite signs of metastatic disease elsewhere in the abdomen or pelvis. 2. Areas of cylindrical bronchiectasis and chronic scarring in the lower lobes of the lungs bilaterally. 3. Aortic atherosclerosis, in addition to left main and 3 vessel coronary artery  disease. 4. Right kidney is either surgically or congenitally absent, or severely atrophic. 5. Additional incidental findings, as above.      02/19/2018 Initial Biopsy    VIA CT BIOPSY: Diagnosis -  Soft Tissue Needle Core Biopsy, gastric - FINDINGS CONSISTENT WITH GASTROINTESTINAL STROMAL TUMOR (GIST). - SEE COMMENT.      02/19/2018 Pathology Results    Diagnosis 02/19/2018 Soft Tissue Needle Core Biopsy, gastric - FINDINGS CONSISTENT WITH GASTROINTESTINAL STROMAL TUMOR (GIST).      03/12/2018 Initial Diagnosis    Malignant gastrointestinal stromal tumor (GIST) of stomach ([place])      03/12/2018 Pathology Results    Surgical path: Diagnosis Stomach, resection for tumor, Lesser curvature - GASTROINTESTINAL STROMAL TUMOR, 13.8 CM.      03/18/2018 Imaging    CT CAP IMPRESSION: -obtained d/t post op fever, leukocytosis, and respiratory change   Moderate right pleural effusion and small left pleural effusion. Compressive atelectasis versus pneumonia in the lower lobes. Nodular airspace disease throughout much of the right upper lobe and right middle lobe concerning for pneumonia.  Cardiomegaly. Severe/diffuse coronary artery disease. Aortic atherosclerosis. No evidence of aortic aneurysm.  Postoperative changes from recent partial gastrectomy. Small amount of free air in the abdomen, likely related to post op state. No evidence of bowel obstruction.  Prior remote right nephrectomy and prostatectomy.      03/22/2018 Procedure    Thoracentesis  Diagnosis PLEURAL FLUID, RIGHT (SPECIMEN 1 OF 1, COLLECTED ON 03/22/2018): NO MALIGNANT CELLS IDENTIFIED.        04/22/2018 Cancer Staging    Staging form: Gastrointestinal Stromal Tumor - Gastric and Omental GIST, AJCC 8th  Edition - Pathologic stage from 04/22/2018: Stage IIIB (pT4, pN0, cM0, Mitotic Rate: High) - Signed by Alla Feeling, NP on 04/22/2018      05/03/2018 Imaging    05/03/2018 CT Abdomen and Pelvis W/  Contrast IMPRESSION: CTA chest:  1. No pulmonary embolus. 2. Debris within the right lower lobe bronchus with associated right lower lobe opacity, this can be seen with aspiration. 3. Improved right upper lobe aeration from prior consistent with resolving pneumonia. Near completely resolved right pleural effusion. 4. Chronic bilateral lower lobe bronchiectasis. Bilateral lower lobe airspace disease with some improvement from most recent prior. 5. Coronary artery calcifications. Aortic Atherosclerosis (ICD10-I70.0).  CT abdomen/pelvis:  1. Large colonic stool burden, suggesting constipation. No bowel obstruction. 2. Post recent partial gastrectomy without evident complication. No intra-abdominal abscess. 3. Additional stable chronic findings as described. Aortic Atherosclerosis (ICD10-I70.0).      05/03/2018 Imaging    05/03/2018 CT Angio Chest PE W or WO Contrast IMPRESSION: CTA chest:  1. No pulmonary embolus. 2. Debris within the right lower lobe bronchus with associated right lower lobe opacity, this can be seen with aspiration. 3. Improved right upper lobe aeration from prior consistent with resolving pneumonia. Near completely resolved right pleural effusion. 4. Chronic bilateral lower lobe bronchiectasis. Bilateral lower lobe airspace disease with some improvement from most recent prior. 5. Coronary artery calcifications. Aortic Atherosclerosis (ICD10-I70.0).      HISTORY OF PRESENTING ILLNESS:  Casey Gates 84 y.o. male is here because of recent diagnosis of GIST. He presents with his son; was referred by Dr. Barry Dienes. He initially became symptomatic in January 2019 with decreased appetite, abdominal fullness, and intermittent atypical chest pain; he went to ED for evaluation. CTA ruled out PE and he was discharged home with outpatient cardiology f/u.  He then presented to [facility] ED on 02/16/18 for rectal bleeding and hematemesis on aspirin therapy. He was  hypotensive with acute anemia in the emergency department, Hgb 7.7 (previously 14.9 in 12/2017). He was stabilized with IVF and 2 units RBCs and admitted for GI bleed work up.  He underwent upper endoscopy per Dr. [name] on 02/16/2018 which showed a large, submucosal partially circumferential mass with no bleeding but stigmata of recent bleeding on the lesser curvature of the stomach.  There was a small area of ulceration with pigmentation noted as well as a large amount of dark red, coffee-ground colored and clotted blood found in the gastric body.  The mass was biopsied, findings consistent with gastrointestinal stromal tumor.  He then underwent staging CT of the abdomen and pelvis on 02/17/2018 which noted the 9.7 x 8.8 x 10.9 enhancing mass along the area of concern which was intimately associated with the undersurface of the left lobe of the liver without definitive evidence of direct invasion.  There was an indeterminate left adrenal nodule measuring 2.0 x 1.4 cm which could be metastatic.  No other evidence of metastatic disease in the abdomen or pelvis.  Hemoglobin improved and he was discharged to [facility] facility with outpatient surgery follow-up scheduled with Dr. Barry Dienes.  Given his high risk for re-bleeding, he was taken to the operating room by Dr. Barry Dienes on 03/12/18 who performed a laparoscopic assisted converted to open partial gastrectomy. Surgical pathology confirmed gastrointestinal stromal tumor measuring 13.8 cm with associated mucosal ulceration extending into the underlying mass; predominately spindle features, mitotic rate greater than 20/50 high power field, grade 2. Final staging revealed pT4pNX. Post op course was complicated by pneumonia  requiring thoracentesis. Cytology was negative for malignant cells. No abdominal complications were noted on postop imaging.  He was discharged to SNF on 03/24/2018 where he currently resides.   He has a personal history of prostate  cancer more than 10 years ago s/p TURP, OSA on CPAP, restless leg syndrome, and chronic back pain on oxymorphone. Is is s/p nephrectomy for "kidney cysts." He previously lived in independent housing at [facility] for 5 years prior to his diagnosis; he ambulated independently with a walker and had assistance with meal prep.  He has remote alcohol history, quit drinking 35 years ago.  He is a never smoker.  He is widowed.  Has 2 children, one living and healthy, one deceased from blood clot.  Family history of "bone" cancer in his mother, and prostate and a great-great grandfather.   Today he reports a slow recovery, energy level is low but improving.  He receives PT therapy daily except on weekends.  His legs are weak, presents in a wheelchair today.  Appetite is low but improving, not back to baseline.  Was previously on Remeron for appetite stimulant but not currently taking.  He has chronic constipation uses MiraLAX and prune juice as needed.  No recent blood in stool.  Denies abdominal pain, nausea or vomiting.  No recent fever, cough, dyspnea, chest pain, or wheezing.  CURRENT THERAPY: Adjuvant Gleevec, starting at 236m daily and gradually increase to 4064mdaily if tolerates, starting 05/26/2018  INTERVAL HISTORY: Casey Steinfeldts a [age].o. male who is here today for follow-up. He is here today with his son. He feels well over all. He complains of loss of appetite. He had a recent fall in the bathroom when he injured his right arm. He is still constipated, and he takes Miralax.  He had aspiration pneumonia recently. He has recovered well.   He states that he lives alone in an assistant living facility.    MEDICAL HISTORY:  Past Medical History:  Diagnosis Date  . Anemia   . Asthma    AS A CHILD  . Blind left eye   . [facility]   PROSTATE CANCER  . Cellulitis    frequently, "all over"  . Chronic kidney disease   . Chronic pain syndrome   . Constipation   . DDD (degenerative  disc disease), lumbar   . [place]astrointestinal [place]  . GI bleeding   . History of blood transfusion    01/2018  . Insomnia   . Kyphoscoliosis   . Mass in the abdomen 01/2018  . Restless leg   . Sleep apnea    does not wear CPAP  . Wears glasses     SURGICAL HISTORY: Past Surgical History:  Procedure Laterality Date  . CHOLECYSTECTOMY    . COLONOSCOPY    . ESOPHAGOGASTRODUODENOSCOPY (EGD) WITH PROPOFOL Left 02/16/2018   Procedure: ESOPHAGOGASTRODUODENOSCOPY (EGD) WITH PROPOFOL;  Surgeon: KaRonnette JuniperMD;  [facility]: Gastro[facility]ology;  Laterality: Left;  . LAPAROSCOPIC GASTRECTOMY N/A 03/12/2018   Procedure: LAPAROSCOPIC ASSISTED PARTIAL GASTRECTOMY ERAS PATHWAY;  Surgeon: ByStark KleinMD;  [facility] Service: General;  Laterality: N/A;  . LAPAROSCOPIC PARTIAL GASTRECTOMY  03/12/2018   LAPAROSCOPIC ASSISTED PARTIAL GASTRECTOMY ERAS PATHWAY  . PROSTATECTOMY    . QUADRICEPS TENDON REPAIR Left 07/07/2017   Procedure: REPAIR QUADRICEP TENDON;  Surgeon: SwRod CanMD;  [facility] Service: Orthopedics;  Laterality: Left;  . RIGHT CATARACT EXTRACTION  2013  . [place]  2006  . TONSILLECTOMY    . TRANSURETHRAL RESECTION OF PROSTATE  1997    SOCIAL HISTORY: Social History   Socioeconomic History  . Marital status: Widowed    Spouse name: Not on file  . Number of children: 2  . Years of education: Not on file  . Highest education level: Not on file  Occupational History  . Not on file  Social Needs  . Financial resource strain: Not on file  . Food insecurity:    Worry: Not on file    Inability: Not on file  . Transportation needs:    Medical: Not on file    Non-medical: Not on file  Tobacco Use  . Smoking status: Never Smoker  . Smokeless tobacco: Never Used  Substance and Sexual Activity  . Alcohol use: No    Alcohol/week: 0.0 oz    Comment: quit 1985, 2 fifths/ week,S/P rehab  . Drug use:  No  . Sexual activity: Never  Lifestyle  . Physical activity:    Days per week: Not on file    Minutes per session: Not on file  . Stress: Not on file  Relationships  . Social connections:    Talks on phone: Not on file    Gets together: Not on file    Attends religious service: Not on file    Active member of club or organization: Not on file    Attends meetings of clubs or organizations: Not on file    Relationship status: Not on file  . Intimate partner violence:    Fear of current or ex partner: Not on file    Emotionally abused: Not on file    Physically abused: Not on file    Forced sexual activity: Not on file  Other Topics Concern  . Not on file  Social History Narrative   Caffeine 2-3 cups average.  Widowed,  Lives at [facility] [place].  Retired Forensic psychologist.  One son.    FAMILY HISTORY: Family History  Problem Relation Age of Onset  . Bone cancer Mother   . Hypertension Mother   . Arthritis Mother   . Clotting disorder Daughter        died of blood clot     ALLERGIES:  is allergic to coconut flavor [flavoring agent]; coconut oil; and food.  MEDICATIONS:  Current Outpatient Medications  Medication Sig Dispense Refill  . gabapentin (NEURONTIN) 300 MG capsule Take 1 capsule (300 mg total) by mouth 3 (three) times daily.    Marland Kitchen oxymorphone (OPANA ER) 10 MG 12 hr tablet Take 10 mg by mouth 4 (four) times daily as needed for pain.    Marland Kitchen acetaminophen (TYLENOL) 650 MG suppository Place 1 suppository (650 mg total) rectally every 4 (four) hours as needed for fever. (Patient not taking: Reported on 05/25/2018) 12 suppository 0  . bacitracin ointment Apply 1 application topically 2 (two) times daily.    . cetaphil (CETAPHIL) lotion Apply 1 application topically 4 (four) times daily as needed (FOR DRY SKIN.). (Patient not taking: Reported on 04/22/2018) 236 mL 0  . doxycycline (VIBRAMYCIN) 50 MG capsule Take 100 mg by mouth 2 (two) times daily.    . feeding  supplement, GLUCERNA SHAKE, (GLUCERNA SHAKE) LIQD Take 237 mLs by mouth 2 (two) times daily between meals. (Patient not taking: Reported on 04/22/2018)  0  . ferrous sulfate 325 (65 FE) MG EC tablet Take 325 mg by mouth daily with breakfast.    .  guaiFENesin (ROBITUSSIN) 100 MG/5ML SOLN Take 5 mLs (100 mg total) by mouth every 4 (four) hours as needed for cough or to loosen phlegm. (Patient not taking: Reported on 04/22/2018) 1200 mL 0  . imatinib (GLEEVEC) 100 MG tablet Week 1 & 2: Take 2 tabs (135m) by mouth daily. Week 3 & 4: Take 3 tab (3059m daily. Take immediately after meals with large glass of water (Patient not taking: Reported on 05/03/2018) 70 tablet 0  . ipratropium-albuterol (DUONEB) 0.5-2.5 (3) MG/3ML SOLN Take 3 mLs by nebulization every 6 (six) hours as needed.    . loperamide (IMODIUM) 2 MG capsule Take 1 capsule (2 mg total) by mouth as needed for diarrhea or loose stools. (Patient not taking: Reported on 05/03/2018) 30 capsule 0  . Melatonin 3 MG TABS Take 1 tablet (3 mg total) by mouth at bedtime. (Patient not taking: Reported on 05/25/2018)  0  . methocarbamol (ROBAXIN) 500 MG tablet Take 1 tablet (500 mg total) by mouth every 8 (eight) hours as needed for muscle spasms. (Patient not taking: Reported on 05/03/2018)    . mirtazapine (REMERON) 7.5 MG tablet Take 1 tablet (7.5 mg total) by mouth at bedtime. 30 tablet 1  . Multiple Vitamin (MULTIVITAMIN WITH MINERALS) TABS tablet Take 1 tablet by mouth daily. (Patient not taking: Reported on 05/25/2018)    . pantoprazole (PROTONIX) 40 MG tablet Take 1 tablet (40 mg total) by mouth daily. (Patient not taking: Reported on 05/25/2018)    . polyethylene glycol (MIRALAX / GLYCOLAX) packet Take 17 g by mouth daily.    . polyvinyl alcohol (LIQUIFILM TEARS) 1.4 % ophthalmic solution Place 1-2 drops into both eyes 3 (three) times daily as needed (FOR DRY EYES.). (Patient not taking: Reported on 05/25/2018) 15 mL 0  . predniSONE (DELTASONE) 10 MG tablet  Take 10-50 mg by mouth See admin instructions. Taper down from 50 mg every 2 days    . protein supplement shake (PREMIER PROTEIN) LIQD Take 325 mLs (11 oz total) by mouth 2 (two) times daily between meals. (Patient not taking: Reported on 04/22/2018)  0  . senna-docusate (SENOKOT-S) 8.6-50 MG tablet Take 1 tablet by mouth at bedtime as needed for mild constipation. (Patient not taking: Reported on 05/25/2018)     No current facility-administered medications for this visit.     REVIEW OF SYSTEMS:  Constitutional: Denies fevers, chills or abnormal night sweats (+) loss of appetite (+) fatigue, slowly improving (+) ambulates with wheelchair Respiratory: Denies cough, dyspnea or wheezes Cardiovascular: Denies palpitation, chest discomfort or lower extremity swelling Gastrointestinal:  Denies nausea, vomiting, diarrhea, abdominal pain, hematochezia, heartburn or change in bowel habits (+) chronic constipation Skin: Denies abnormal skin rashes. Lymphatics: Denies new lymphadenopathy or easy bruising Neurological:Denies numbness, tingling  Behavioral/Psych: Mood is stable, no new changes  MSK: (+) chronic back pain (+) restless leg syndrome (+) right arm pain after fall All other systems were reviewed with the patient and are negative.  PHYSICAL EXAMINATION: ECOG PERFORMANCE STATUS: 3   Vitals:   05/25/18 1423  BP: 117/62  Pulse: 69  Resp: 18  Temp: 98.8 F (37.1 C)  SpO2: 94%   Filed Weights   05/25/18 1423  Weight: 140 lb (63.5 kg)    GENERAL:alert, no distress (+) wheelchair SKIN: no rashes or significant lesions  EYES: normal, conjunctiva are pink and non-injected, sclera clear OROPHARYNX:no thrush or ulcers    LYMPH:  no palpable cervical, supraclavicular, or axillary lymphadenopathy  LUNGS: clear to auscultation with normal  breathing effort HEART: regular rate & rhythm and no murmurs and no lower extremity edema ABDOMEN:abdomen soft, non-tender and normal bowel sounds. (+)  midline abdominal incision is healing well, mild erythema, no drainage.  Musculoskeletal:no cyanosis of digits and no clubbing  PSYCH: alert & oriented x 3 with fluent speech NEURO: generalized weakness  LABORATORY DATA:  I have reviewed the data as listed CBC Latest Ref Rng & Units 05/25/2018 05/03/2018 04/22/2018  WBC 4.0 - 10.3 K/uL 7.9 8.2 7.3  Hemoglobin 13.0 - 17.1 g/dL 12.0(L) 11.1(L) 11.4(L)  Hematocrit 38.4 - 49.9 % 38.9 37.2(L) 36.9(L)  Platelets 140 - 400 K/uL 169 250 214   CMP Latest Ref Rng & Units 05/25/2018 05/03/2018 04/22/2018  Glucose 70 - 140 mg/dL 109 135(H) 93  BUN 7 - 26 mg/dL _0 Creatinine 0.70 - 1.30 mg/dL 1.00 0.82 0.85  Sodium 136 - 145 mmol/L 141 133(L) 141  Potassium 3.5 - 5.1 mmol/L 4.3 4.6 4.2  Chloride 98 - 109 mmol/L 108 100(L) 106  CO2 22 - 29 mmol/L _1 Calcium 8.4 - 10.4 mg/dL 8.9 8.6(L) 9.1  Total Protein 6.4 - 8.3 g/dL 6.1(L) 6.7 6.6  Total Bilirubin 0.2 - 1.2 mg/dL 0.3 0.5 0.3  Alkaline Phos 40 - 150 U/L 83 76 102  AST 5 - 34 U/L _2 ALT 0 - 55 U/L _3 PATHOLOGY  Diagnosis 03/12/2018 Stomach, resection for tumor, Lesser curvature - GASTROINTESTINAL STROMAL TUMOR, 13.8 CM. - SEE ONCOLOGY TABLE. Microscopic Comment GASTROINTESTINAL STROMAL TUMOR (GIST): Procedure: Resection of lesser gastric curvature mass. Tumor Site: Stomach, lesser curvature. Specify: Lesser curvature. Tumor Size: 13.8 cm. Tumor Focality: Unifocal GIST Subtype (spindle, epithelioid, mixed, etc.): Predominately spindled. Mitotic Rate: Greater than 20/50 HIGH POWER FIELD Necrosis: Present, focal. Histologic Grade: G2. G1: Low grade; mitotic rate 5/50 HIGH POWER FIELD. G2: High grade; mitotic rate >5/50 HIGH POWER FIELD. Risk Assessment: High risk. Margins: Free of tumor. Distance of tumor from closest margin: Less than 0.2 cm. Ancillary testing: Immunohistochemistry, see comment. Lymph nodes: number examined: 0; number positive:  N/A Pathologic Staging: pT4 pNX. Comments: The tumor is 13.8 cm in greatest dimension and is associated with mucosal ulceration which extends into the underlying mass. The tumor has a predominate spindle cell pattern and the mitotic rate is greater than 20/50 high power fields. There is hypercellularity and variable nuclear atypia. Immunohistochemistry is performed and the tumor is positive with CD117 (C-Kit) and CD34 and negative with cytokeratin AE1/AE3. Ki-67 immunohistochemistry shows a Ki-67 index of 20%. (JDP:gt, 03/16/18)ss and Clin Specimen(s) Obtained: Stomach, resection for tumor, Lesser curvature Specimen Clinical Information GIST stomach gastrointestinal stromal tumor (tl) Gross Received fresh is a 13.8 x 9.2 x 6.7 cm firm nodular mass with an attached 7.0 x 6.5 cm portion of stomach, clinically lesser curvature. The gastric mucosa shows a central 1.2 x 0.5 cm ulcer. The ulcer extends into the nodular mass. The cut surface of the mass is tan-white with a 7.0 cm cavitary defect containing bloody fluid and clotted blood. There are focal areas of calcification. There is a line of staples present along one edge of the portion of stomach. Sections are submitted in ten cassettes. A-B = sections parallel to stapled gastric mucosa C = section of mass to include overlying ulcerated gastric mucosa D-J = sections of nodular mass. (GP:ah 03/13/18) Stain(s) used in Diagnosis: The following stain(s) were used in diagnosing the case: CD117 (C-KIT), KI-67-NO ACIS,  CD 34, CK AE1AE3. The control(s) stained appropriately.  Diagnosis 02/19/2018 Soft Tissue Needle Core Biopsy, gastric - FINDINGS CONSISTENT WITH GASTROINTESTINAL STROMAL TUMOR (GIST). Microscopic Comment Dr. Vicente Males has reviewed the case and concurs with this interpretation. Specimen(s) Obtained: Soft Tissue Needle Core Biopsy, gastric Specimen Clinical Information gastric mass (cm) Gross The specimen is received in formalin and  consists of four cores of tan-white to red soft tissue, ranging from 0.6 to 2.7 cm in length x 0.1 cm in diameter. The specimen is entirely submitted in two cassettes.  PROCEDURES:   02/16/2018 Upper Endoscopy per Dr. [name] Impression: - Normal upper third of esophagus and middle third of esophagus. - 5 cm hiatal hernia. - Widely patent Schatzki ring. - Clotted blood in the gastric body. - Rule out malignancy, gastric tumor on the lesser curvature of the stomach. Injected. Clips were placed. - Normal examined duodenum. - No specimens collected.   RADIOGRAPHIC STUDIES: I have personally reviewed the radiological images as listed and agreed with the findings in the report. Dg Chest 2 View  Result Date: 05/03/2018 CLINICAL DATA:  Cough and left-sided chest pain EXAM: CHEST - 2 VIEW COMPARISON:  03/22/2018 FINDINGS: Cardiac shadow is stable. Previously seen patchy airspace disease in the right lung has resolved in the interval. No sizable effusion is seen. No bony is noted. IMPRESSION: No acute abnormality seen. Electronically Signed   By: Inez Catalina M.D.   On: 05/03/2018 21:15   Ct Angio Chest Pe W And/or Wo Contrast  Result Date: 05/03/2018 CLINICAL DATA:  Shortness of breath. Generalized abdominal pain. Recent abdominal surgery. EXAM: CT ANGIOGRAPHY CHEST CT ABDOMEN AND PELVIS WITH CONTRAST TECHNIQUE: Multidetector CT imaging of the chest was performed using the standard protocol during bolus administration of intravenous contrast. Multiplanar CT image reconstructions and MIPs were obtained to evaluate the vascular anatomy. Multidetector CT imaging of the abdomen and pelvis was performed using the standard protocol during bolus administration of intravenous contrast. CONTRAST:  174m ISOVUE-370 IOPAMIDOL (ISOVUE-370) INJECTION 76% COMPARISON:  Chest, abdomen, pelvis CT 03/18/2018. Abdominal radiograph earlier this day FINDINGS: CTA CHEST FINDINGS Cardiovascular: There are no filling defects  within the pulmonary arteries to suggest pulmonary embolus. Tortuous atherosclerotic thoracic aorta without dissection. Coronary artery calcifications or stents. No pericardial effusion. Mediastinum/Nodes: Small mediastinal lymph nodes not enlarged by size criteria. Upper esophagus minimally patulous. Visualized thyroid gland is normal. Lungs/Pleura: Debris within the right lower lobe bronchus leads to short-segment bronchial filling and associated right lower lobe opacities. Left lower lobe opacities with bronchiectasis, with slight improvement from prior exam. Few patchy and ground-glass opacities in the right upper lobe, with improvement from prior exam. Right pleural effusion has diminished, small volume of pleural fluid persists. Few calcifications in the consolidated right lower lobe appear chronic. Musculoskeletal: Scoliosis and degenerative change in the spine. There are no acute or suspicious osseous abnormalities. Review of the MIP images confirms the above findings. CT ABDOMEN and PELVIS FINDINGS Hepatobiliary: No focal hepatic lesion. Clips in the gallbladder fossa postcholecystectomy. No biliary dilatation. Pancreas: Parenchymal atrophy. No ductal dilatation or inflammation. Spleen: Scattered calcified granuloma.  Normal in size. Adrenals/Urinary Tract: Prior right nephrectomy. Normal right adrenal gland. Low-density 16 mm left adrenal nodule. No left hydronephrosis. Parapelvic and simple cysts in the left kidney. Urinary bladder is partially distended, no bladder wall thickening. Stomach/Bowel: Partial gastrectomy, stomach physiologically distended. No evidence of bowel obstruction, wall thickening or inflammatory change. Moderate colonic stool burden. Mild diverticulosis of the sigmoid colon without diverticulitis. Appendix  is not confidently visualized on the current exam. No findings to suggest appendicitis. Vascular/Lymphatic: Aortic atherosclerosis and tortuosity. No aneurysm. Mesenteric vessels  are patent. No enlarged abdominal or pelvic lymph nodes, paucity of intra-abdominal fat partially limits assessment. Reproductive: Prior prostatectomy. Other: No abscess, free air free fluid. Prior free air in the abdomen has resolved. Postsurgical change of the anterior abdominal wall. Musculoskeletal: Scoliosis and multilevel degenerative change throughout spine. No acute osseous abnormality. Review of the MIP images confirms the above findings. IMPRESSION: CTA chest: 1. No pulmonary embolus. 2. Debris within the right lower lobe bronchus with associated right lower lobe opacity, this can be seen with aspiration. 3. Improved right upper lobe aeration from prior consistent with resolving pneumonia. Near completely resolved right pleural effusion. 4. Chronic bilateral lower lobe bronchiectasis. Bilateral lower lobe airspace disease with some improvement from most recent prior. 5. Coronary artery calcifications. Aortic Atherosclerosis (ICD10-I70.0). CT abdomen/pelvis: 1. Large colonic stool burden, suggesting constipation. No bowel obstruction. 2. Post recent partial gastrectomy without evident complication. No intra-abdominal abscess. 3. Additional stable chronic findings as described. Aortic Atherosclerosis (ICD10-I70.0). Electronically Signed   By: Jeb Levering M.D.   On: 05/03/2018 23:07   Ct Abdomen Pelvis W Contrast  Result Date: 05/03/2018 CLINICAL DATA:  Shortness of breath. Generalized abdominal pain. Recent abdominal surgery. EXAM: CT ANGIOGRAPHY CHEST CT ABDOMEN AND PELVIS WITH CONTRAST TECHNIQUE: Multidetector CT imaging of the chest was performed using the standard protocol during bolus administration of intravenous contrast. Multiplanar CT image reconstructions and MIPs were obtained to evaluate the vascular anatomy. Multidetector CT imaging of the abdomen and pelvis was performed using the standard protocol during bolus administration of intravenous contrast. CONTRAST:  171m [id number]  IOPAMIDOL ([id number]) INJECTION 76% COMPARISON:  Chest, abdomen, pelvis CT 03/18/2018. Abdominal radiograph earlier this day FINDINGS: CTA CHEST FINDINGS Cardiovascular: There are no filling defects within the pulmonary arteries to suggest pulmonary embolus. Tortuous atherosclerotic thoracic aorta without dissection. Coronary artery calcifications or stents. No pericardial effusion. Mediastinum/Nodes: Small mediastinal lymph nodes not enlarged by size criteria. Upper esophagus minimally patulous. Visualized thyroid gland is normal. Lungs/Pleura: Debris within the right lower lobe bronchus leads to short-segment bronchial filling and associated right lower lobe opacities. Left lower lobe opacities with bronchiectasis, with slight improvement from prior exam. Few patchy and ground-glass opacities in the right upper lobe, with improvement from prior exam. Right pleural effusion has diminished, small volume of pleural fluid persists. Few calcifications in the consolidated right lower lobe appear chronic. Musculoskeletal: Scoliosis and degenerative change in the spine. There are no acute or suspicious osseous abnormalities. Review of the MIP images confirms the above findings. CT ABDOMEN and PELVIS FINDINGS Hepatobiliary: No focal hepatic lesion. Clips in the gallbladder fossa postcholecystectomy. No biliary dilatation. Pancreas: Parenchymal atrophy. No ductal dilatation or inflammation. Spleen: Scattered calcified granuloma.  Normal in size. Adrenals/Urinary Tract: Prior right nephrectomy. Normal right adrenal gland. Low-density 16 mm left adrenal nodule. No left hydronephrosis. Parapelvic and simple cysts in the left kidney. Urinary bladder is partially distended, no bladder wall thickening. Stomach/Bowel: Partial gastrectomy, stomach physiologically distended. No evidence of bowel obstruction, wall thickening or inflammatory change. Moderate colonic stool burden. Mild diverticulosis of the sigmoid colon without  diverticulitis. Appendix is not confidently visualized on the current exam. No findings to suggest appendicitis. Vascular/Lymphatic: Aortic atherosclerosis and tortuosity. No aneurysm. Mesenteric vessels are patent. No enlarged abdominal or pelvic lymph nodes, paucity of intra-abdominal fat partially limits assessment. Reproductive: Prior prostatectomy. Other: No abscess, free air free fluid. Prior  free air in the abdomen has resolved. Postsurgical change of the anterior abdominal wall. Musculoskeletal: Scoliosis and multilevel degenerative change throughout spine. No acute osseous abnormality. Review of the MIP images confirms the above findings. IMPRESSION: CTA chest: 1. No pulmonary embolus. 2. Debris within the right lower lobe bronchus with associated right lower lobe opacity, this can be seen with aspiration. 3. Improved right upper lobe aeration from prior consistent with resolving pneumonia. Near completely resolved right pleural effusion. 4. Chronic bilateral lower lobe bronchiectasis. Bilateral lower lobe airspace disease with some improvement from most recent prior. 5. Coronary artery calcifications. Aortic Atherosclerosis (ICD10-I70.0). CT abdomen/pelvis: 1. Large colonic stool burden, suggesting constipation. No bowel obstruction. 2. Post recent partial gastrectomy without evident complication. No intra-abdominal abscess. 3. Additional stable chronic findings as described. Aortic Atherosclerosis (ICD10-I70.0). Electronically Signed   By: Jeb Levering M.D.   On: 05/03/2018 23:07    ASSESSMENT & PLAN:  Casey Gates is a pleasant 84 y.o. caucasian male with PMH of prostate cancer presented with decreased appetite, abdominal fullness, GI bleeding, and anemia found to have large malignant mass in the stomach s/p partial gastrectomy  1. 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 He underwent complete resection per Dr. Barry Dienes on 03/12/18. Pathology revealed a large 13.8 cm GIST in the stomach. IHC was performed and the tumor is positive with CD117 (C-Kit). He has high risk features such as large size, high mitotic rate, and the location. His risk of recurrence is around 86%.  -I recommend him to try adjuvant Gleevec to reduce the risk of recurrence.  Potential side effects reviewed with him and his son again, especially his management of diarrhea, he voiced good understanding.  He would like to try.  I recommend him to start a low dose 200 mg daily for 2 weeks, if he tolerates it well, will increase to 300 mg daily for 2 weeks, then increase to 400 mg daily as for dose.   -he has recovered well from recent aspiration pneumonia, will pick up [place] later today, and start tomorrow. -Due to her advanced age, comorbidities, and limited performance status, if he does have significant side effects from [place], I have low threshold to stop it.   2. Anemia, secondary to blood loss -He initially presented with GI bleed and symptomatic anemia, Hgb 7.7 on 02/16/18; he was transfused total 3 units with improvement to 9.2 -Hgb began to trend down again at the time of his surgery in 03/2018; Hgb 6.6 one week post-op. Hgb up to 8.3 at discharge.  -He was prescribed oral iron therapy 1 tab daily beginning in 04/2018; iron studies not done initially  -Rechecked CBC today, Hgb improved to 12, iron study showed mild iron deficiency -Continue oral ferrous sulfate daily  3. Chronic constipation -He uses miralax and prune juice PRN -He is on chronic opioid use with oxymorphone for chronic back pain, this is contributing to constipation.  -Denies further rectal bleeding.  -I encouraged him to use laxative and stool softener routinely to maintain regular BM, increase water intake and mobility.   4.  Deconditioning, fatigue and low appetite  -He was at [facility] where he participates in daily PT; he requires assistance with all activities then. He is now back to independent housing, and that his son is considering  moving him to assisted living, due to his recent fall. -I have called in mirtazapine 7.5 mg daily for his low appetite today   PLAN: -He will start [place] at 200 mg daily tomorrow, for 2 weeks, then increase to 300 mg daily for 2 weeks before increase to 472m daily  -I will see him back in 3 to 4 weeks     No orders of the defined types were placed in this encounter.   All questions were answered. The patient knows to call the clinic with any problems, questions or concerns. I spent 20 minutes counseling the patient face to face. The total time spent in the appointment was 25 minutes and more than 50% was on counseling, review of test results, and coordination of care.   IDierdre SearlesDweik am acting as scribe for Dr. YTruitt Merle  I have reviewed the above documentation for accuracy and completeness, and I agree with the above.    YTruitt Merle MD 05/25/2018

## 2018-05-24 IMAGING — US US THORACENTESIS ASP PLEURAL SPACE W/IMG GUIDE
1 series · 4 of 4 positions shown · non-contrast
Comparison: none

INDICATION: Shortness of breath. Right-sided pleural effusion. Request
diagnostic and therapeutic thoracentesis.

[Series 1: us thoracentesis asp pleural space w/img guide · 0.23mm/px · 4 of 4 slices shown]
[im 1/4]
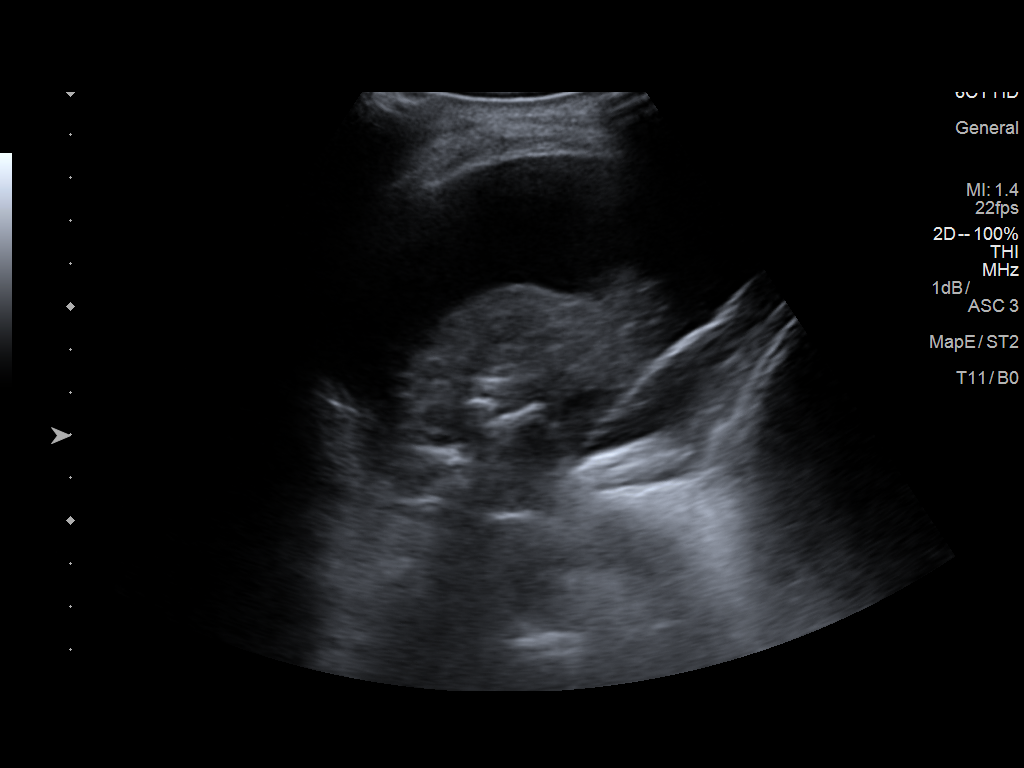
[im 2/4]
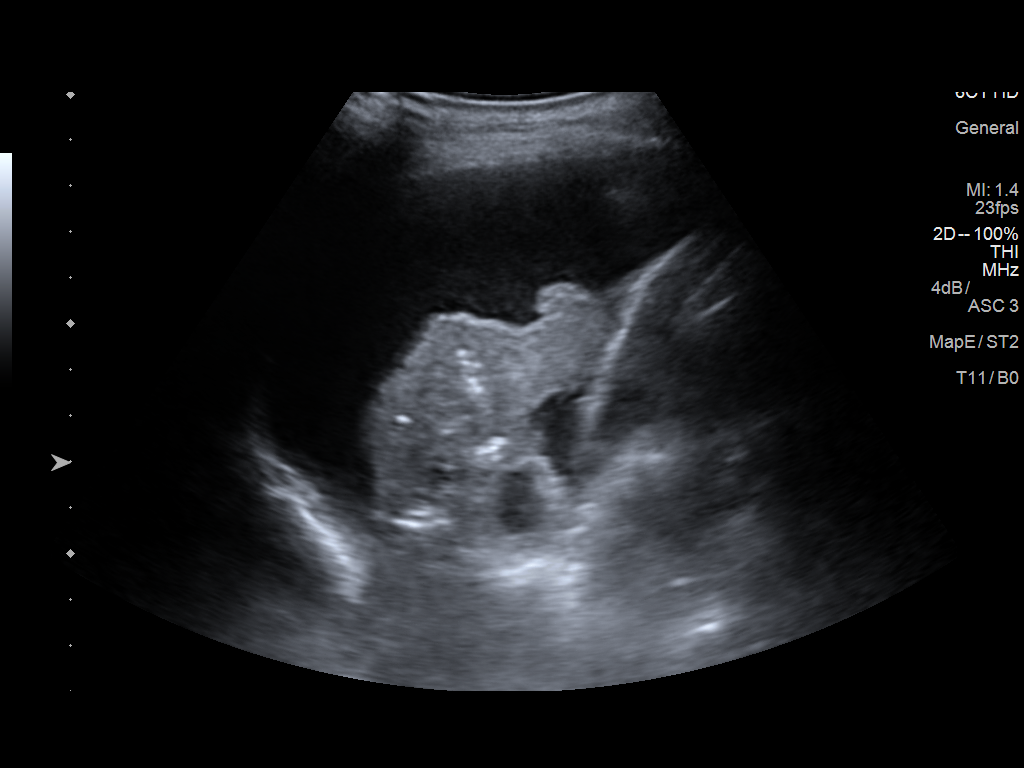
[im 3/4]
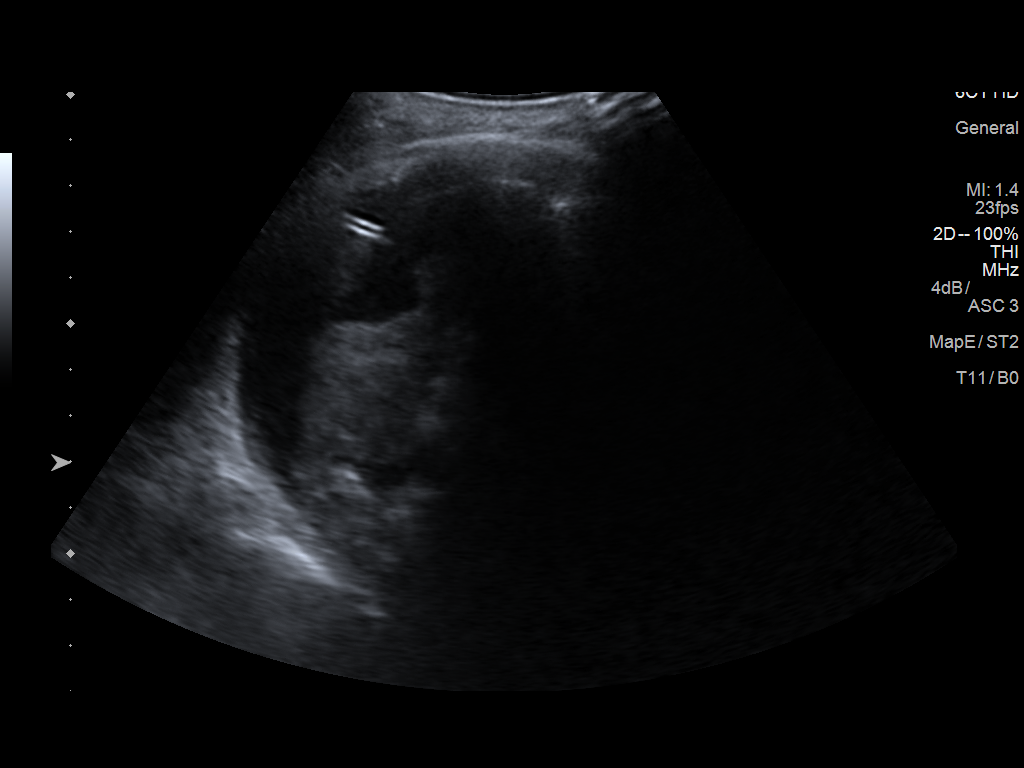
[im 4/4]
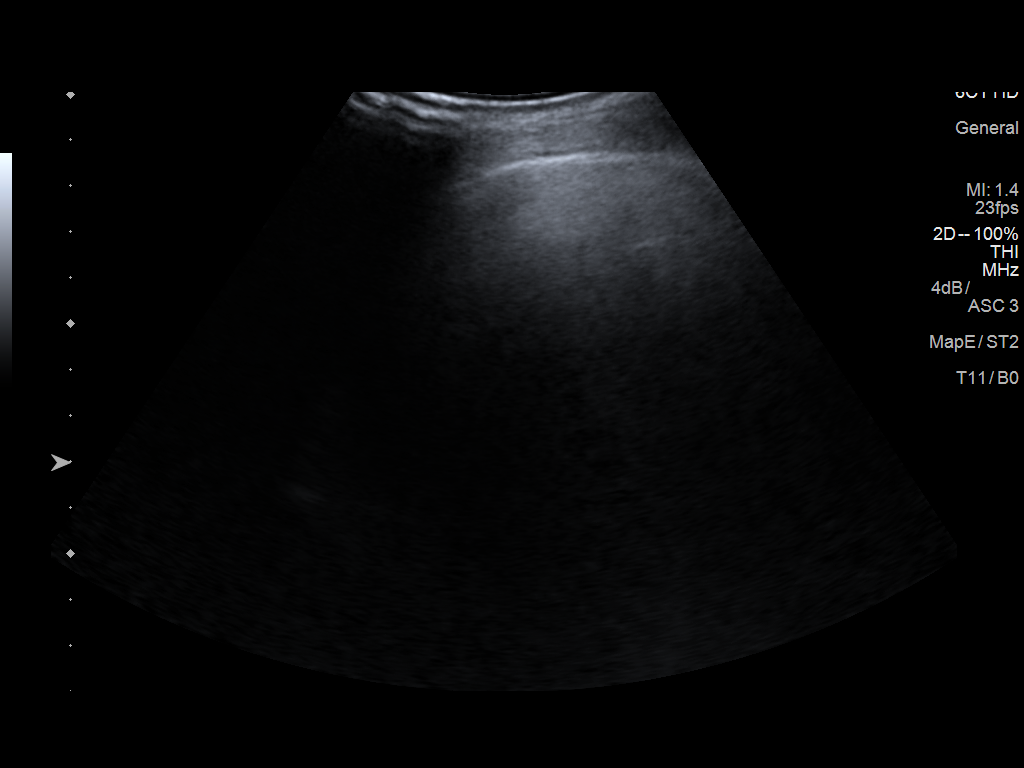

[4 of 4 positions shown; findings below may reference images not displayed]

EXAM:
ULTRASOUND GUIDED RIGHT THORACENTESIS

MEDICATIONS:
None.

COMPLICATIONS:
None immediate.

PROCEDURE:
An ultrasound guided thoracentesis was thoroughly discussed with the
patient and questions answered. The benefits, risks, alternatives
and complications were also discussed. The patient understands and
wishes to proceed with the procedure. Written consent was obtained.

Ultrasound was performed to localize and mark an adequate pocket of
fluid in the right chest. The area was then prepped and draped in
the normal sterile fashion. 1% Lidocaine was used for local
anesthesia. Under ultrasound guidance a Safe-T-Centesis catheter was
introduced. Thoracentesis was performed. The catheter was removed
and a dressing applied.
FINDINGS: A total of approximately 780 mL of clear, amber colored fluid was
removed. Samples were sent to the laboratory as requested by the
clinical team.
IMPRESSION: Successful ultrasound guided right thoracentesis yielding 780 mL of
pleural fluid.

## 2018-05-25 ENCOUNTER — Inpatient Hospital Stay: Payer: Medicare Other

## 2018-05-25 ENCOUNTER — Telehealth: Payer: Self-pay | Admitting: Hematology

## 2018-05-25 ENCOUNTER — Inpatient Hospital Stay: Payer: Medicare Other | Attending: Oncology | Admitting: Hematology

## 2018-05-25 ENCOUNTER — Encounter: Payer: Self-pay | Admitting: Hematology

## 2018-05-25 VITALS — BP 117/62 | HR 69 | Temp 98.8°F | Resp 18 | Ht 69.0 in | Wt 140.0 lb

## 2018-05-25 DIAGNOSIS — K222 Esophageal obstruction: Secondary | ICD-10-CM | POA: Diagnosis not present

## 2018-05-25 DIAGNOSIS — C49A2 Gastrointestinal stromal tumor of stomach: Secondary | ICD-10-CM

## 2018-05-25 DIAGNOSIS — K5900 Constipation, unspecified: Secondary | ICD-10-CM

## 2018-05-25 DIAGNOSIS — Z8546 Personal history of malignant neoplasm of prostate: Secondary | ICD-10-CM | POA: Diagnosis not present

## 2018-05-25 DIAGNOSIS — J470 Bronchiectasis with acute lower respiratory infection: Secondary | ICD-10-CM

## 2018-05-25 DIAGNOSIS — D649 Anemia, unspecified: Secondary | ICD-10-CM | POA: Diagnosis not present

## 2018-05-25 DIAGNOSIS — Z79899 Other long term (current) drug therapy: Secondary | ICD-10-CM | POA: Diagnosis not present

## 2018-05-25 DIAGNOSIS — K449 Diaphragmatic hernia without obstruction or gangrene: Secondary | ICD-10-CM

## 2018-05-25 DIAGNOSIS — J45909 Unspecified asthma, uncomplicated: Secondary | ICD-10-CM | POA: Diagnosis not present

## 2018-05-25 DIAGNOSIS — I700 Atherosclerosis of aorta: Secondary | ICD-10-CM

## 2018-05-25 DIAGNOSIS — M5136 Other intervertebral disc degeneration, lumbar region: Secondary | ICD-10-CM | POA: Diagnosis not present

## 2018-05-25 LAB — CMP (CANCER CENTER ONLY)
ALK PHOS: 83 U/L (ref 40–150)
ALT: 15 U/L (ref 0–55)
ANION GAP: 7 (ref 3–11)
AST: 24 U/L (ref 5–34)
Albumin: 3.4 g/dL — ABNORMAL LOW (ref 3.5–5.0)
BILIRUBIN TOTAL: 0.3 mg/dL (ref 0.2–1.2)
BUN: 20 mg/dL (ref 7–26)
CALCIUM: 8.9 mg/dL (ref 8.4–10.4)
CO2: 26 mmol/L (ref 22–29)
Chloride: 108 mmol/L (ref 98–109)
Creatinine: 1 mg/dL (ref 0.70–1.30)
GFR, Est AFR Am: >60 mL/min (ref >60)
Glucose, Bld: 109 mg/dL (ref 70–140)
POTASSIUM: 4.3 mmol/L (ref 3.5–5.1)
Sodium: 141 mmol/L (ref 136–145)
TOTAL PROTEIN: 6.1 g/dL — AB (ref 6.4–8.3)

## 2018-05-25 LAB — CBC WITH DIFFERENTIAL (CANCER CENTER ONLY)
BASOS ABS: 0 10*3/uL (ref 0.0–0.1)
BASOS PCT: 0 %
EOS PCT: 3 %
Eosinophils Absolute: 0.2 10*3/uL (ref 0.0–0.5)
HCT: 38.9 % (ref 38.4–49.9)
Hemoglobin: 12 g/dL — ABNORMAL LOW (ref 13.0–17.1)
LYMPHS PCT: 23 %
Lymphs Abs: 1.8 10*3/uL (ref 0.9–3.3)
MCH: 25.4 pg — ABNORMAL LOW (ref 27.2–33.4)
MCHC: 30.8 g/dL — ABNORMAL LOW (ref 32.0–36.0)
MCV: 82.4 fL (ref 79.3–98.0)
MONO ABS: 0.7 10*3/uL (ref 0.1–0.9)
Monocytes Relative: 9 %
Neutro Abs: 5.1 10*3/uL (ref 1.5–6.5)
Neutrophils Relative %: 65 %
PLATELETS: 169 10*3/uL (ref 140–400)
RBC: 4.72 MIL/uL (ref 4.20–5.82)
RDW: 19.5 % — AB (ref 11.0–14.6)
WBC: 7.9 10*3/uL (ref 4.0–10.3)

## 2018-05-25 LAB — IRON AND TIBC
Iron: 23 ug/dL — ABNORMAL LOW (ref 42–163)
SATURATION RATIOS: 9 % — AB (ref 42–163)
TIBC: 256 ug/dL (ref 202–409)
UIBC: 234 ug/dL

## 2018-05-25 LAB — FERRITIN: FERRITIN: 34 ng/mL (ref 22–316)

## 2018-05-25 MED ORDER — MIRTAZAPINE 7.5 MG PO TABS: 7.5000 mg | ORAL_TABLET | Freq: Every day | ORAL | 1 refills | Status: DC

## 2018-05-25 NOTE — Telephone Encounter (Signed)
Appointments scheduled , letter / calendar mailed to patient per 6/24 los

## 2018-06-16 ENCOUNTER — Other Ambulatory Visit: Payer: Self-pay | Admitting: Hematology

## 2018-06-16 DIAGNOSIS — C49A2 Gastrointestinal stromal tumor of stomach: Secondary | ICD-10-CM

## 2018-06-17 NOTE — Progress Notes (Signed)
[name]  [phone] Fax:([phone]  Clinic Follow Up Note   Patient Care Team: Velna Hatchet, MD as PCP - General (Internal Medicine) [name], MD as PCP - Cardiology (Cardiology) 06/22/2018  CHIEF COMPLAINTS:  Follow Up gastric GIST    SUMMARY OF ONCOLOGY HISTORY  Oncology History   Cancer Staging Malignant gastrointestinal stromal tumor (GIST) of stomach ([place]) Staging form: Gastrointestinal Stromal Tumor - Gastric and Omental GIST, AJCC 8th Edition - Pathologic stage from 04/22/2018: Stage IIIB (pT4, pN0, cM0, Mitotic Rate: High) - Signed by Alla Feeling, NP on 04/22/2018       Malignant gastrointestinal stromal tumor (GIST) of stomach ([place])   02/16/2018 Procedure    Upper EGD per Dr. [name] Impression:  - Normal upper third of esophagus and middle third of esophagus. - 5 cm hiatal hernia. - Widely patent Schatzki ring. - Clotted blood in the gastric body. - Rule out malignancy, gastric tumor on the lesser curvature of the stomach. Injected. Clips were placed. - Normal examined duodenum. - No specimens collected.      02/17/2018 Imaging    CT AP IMPRESSION: 1. 9.7 x 8.8 x 10.9 cm aggressive appearing heterogeneously enhancing mass along the lesser curvature of the stomach, highly concerning for primary gastric neoplasm. This is intimately associated with the undersurface of the left lobe of the liver without definitive evidence of direct invasion (although superficial involvement of the hepatic capsule is suspected given the loss of the intervening fat plane). There is also an indeterminate left adrenal nodule measuring 2.0 x 1.4 cm, which could be metastatic. No other definite signs of metastatic disease elsewhere in the abdomen or pelvis. 2. Areas of cylindrical bronchiectasis and chronic scarring in the lower lobes of the lungs bilaterally. 3. Aortic atherosclerosis, in addition to left main and 3 vessel coronary artery  disease. 4. Right kidney is either surgically or congenitally absent, or severely atrophic. 5. Additional incidental findings, as above.      02/19/2018 Initial Biopsy    VIA CT BIOPSY: Diagnosis -  Soft Tissue Needle Core Biopsy, gastric - FINDINGS CONSISTENT WITH GASTROINTESTINAL STROMAL TUMOR (GIST). - SEE COMMENT.      02/19/2018 Pathology Results    Diagnosis 02/19/2018 Soft Tissue Needle Core Biopsy, gastric - FINDINGS CONSISTENT WITH GASTROINTESTINAL STROMAL TUMOR (GIST).      03/12/2018 Initial Diagnosis    Malignant gastrointestinal stromal tumor (GIST) of stomach ([place])      03/12/2018 Pathology Results    Surgical path: Diagnosis Stomach, resection for tumor, Lesser curvature - GASTROINTESTINAL STROMAL TUMOR, 13.8 CM.      03/18/2018 Imaging    CT CAP IMPRESSION: -obtained d/t post op fever, leukocytosis, and respiratory change   Moderate right pleural effusion and small left pleural effusion. Compressive atelectasis versus pneumonia in the lower lobes. Nodular airspace disease throughout much of the right upper lobe and right middle lobe concerning for pneumonia.  Cardiomegaly. Severe/diffuse coronary artery disease. Aortic atherosclerosis. No evidence of aortic aneurysm.  Postoperative changes from recent partial gastrectomy. Small amount of free air in the abdomen, likely related to post op state. No evidence of bowel obstruction.  Prior remote right nephrectomy and prostatectomy.      03/22/2018 Procedure    Thoracentesis  Diagnosis PLEURAL FLUID, RIGHT (SPECIMEN 1 OF 1, COLLECTED ON 03/22/2018): NO MALIGNANT CELLS IDENTIFIED.        04/22/2018 Cancer Staging    Staging form: Gastrointestinal Stromal Tumor - Gastric and Omental GIST, AJCC 8th  Edition - Pathologic stage from 04/22/2018: Stage IIIB (pT4, pN0, cM0, Mitotic Rate: High) - Signed by Alla Feeling, NP on 04/22/2018      05/03/2018 Imaging    05/03/2018 CT Abdomen and Pelvis W/  Contrast IMPRESSION: CTA chest:  1. No pulmonary embolus. 2. Debris within the right lower lobe bronchus with associated right lower lobe opacity, this can be seen with aspiration. 3. Improved right upper lobe aeration from prior consistent with resolving pneumonia. Near completely resolved right pleural effusion. 4. Chronic bilateral lower lobe bronchiectasis. Bilateral lower lobe airspace disease with some improvement from most recent prior. 5. Coronary artery calcifications. Aortic Atherosclerosis (ICD10-I70.0).  CT abdomen/pelvis:  1. Large colonic stool burden, suggesting constipation. No bowel obstruction. 2. Post recent partial gastrectomy without evident complication. No intra-abdominal abscess. 3. Additional stable chronic findings as described. Aortic Atherosclerosis (ICD10-I70.0).      05/03/2018 Imaging    05/03/2018 CT Angio Chest PE W or WO Contrast IMPRESSION: CTA chest:  1. No pulmonary embolus. 2. Debris within the right lower lobe bronchus with associated right lower lobe opacity, this can be seen with aspiration. 3. Improved right upper lobe aeration from prior consistent with resolving pneumonia. Near completely resolved right pleural effusion. 4. Chronic bilateral lower lobe bronchiectasis. Bilateral lower lobe airspace disease with some improvement from most recent prior. 5. Coronary artery calcifications. Aortic Atherosclerosis (ICD10-I70.0).      05/03/2018 Imaging    05/03/2018 CT CAP IMPRESSION: CTA chest: 1. No pulmonary embolus. 2. Debris within the right lower lobe bronchus with associated right lower lobe opacity, this can be seen with aspiration. 3. Improved right upper lobe aeration from prior consistent with resolving pneumonia. Near completely resolved right pleural effusion. 4. Chronic bilateral lower lobe bronchiectasis. Bilateral lower lobe airspace disease with some improvement from most recent prior. 5. Coronary artery  calcifications. Aortic Atherosclerosis (ICD10-I70.0).  CT abdomen/pelvis: 1. Large colonic stool burden, suggesting constipation. No bowel obstruction. 2. Post recent partial gastrectomy without evident complication. No intra-abdominal abscess. 3. Additional stable chronic findings as described. Aortic Atherosclerosis (ICD10-I70.0).       HISTORY OF PRESENTING ILLNESS:  Rush Salce 84 y.o. male is here because of recent diagnosis of GIST. He presents with his son; was referred by Dr. Barry Dienes. He initially became symptomatic in January 2019 with decreased appetite, abdominal fullness, and intermittent atypical chest pain; he went to ED for evaluation. CTA ruled out PE and he was discharged home with outpatient cardiology f/u.  He then presented to [facility] ED on 02/16/18 for rectal bleeding and hematemesis on aspirin therapy. He was hypotensive with acute anemia in the emergency department, Hgb 7.7 (previously 14.9 in 12/2017). He was stabilized with IVF and 2 units RBCs and admitted for GI bleed work up.  He underwent upper endoscopy per Dr. [name] on 02/16/2018 which showed a large, submucosal partially circumferential mass with no bleeding but stigmata of recent bleeding on the lesser curvature of the stomach.  There was a small area of ulceration with pigmentation noted as well as a large amount of dark red, coffee-ground colored and clotted blood found in the gastric body.  The mass was biopsied, findings consistent with gastrointestinal stromal tumor.  He then underwent staging CT of the abdomen and pelvis on 02/17/2018 which noted the 9.7 x 8.8 x 10.9 enhancing mass along the area of concern which was intimately associated with the undersurface of the left lobe of the liver without definitive evidence of direct invasion.  There was  an indeterminate left adrenal nodule measuring 2.0 x 1.4 cm which could be metastatic.  No other evidence of metastatic disease in the abdomen or pelvis.  Hemoglobin  improved and he was discharged to [facility] facility with outpatient surgery follow-up scheduled with Dr. Barry Dienes.  Given his high risk for re-bleeding, he was taken to the operating room by Dr. Barry Dienes on 03/12/18 who performed a laparoscopic assisted converted to open partial gastrectomy. Surgical pathology confirmed gastrointestinal stromal tumor measuring 13.8 cm with associated mucosal ulceration extending into the underlying mass; predominately spindle features, mitotic rate greater than 20/50 high power field, grade 2. Final staging revealed pT4pNX. Post op course was complicated by pneumonia requiring thoracentesis. Cytology was negative for malignant cells. No abdominal complications were noted on postop imaging.  He was discharged to SNF on 03/24/2018 where he currently resides.   He has a personal history of prostate cancer more than 10 years ago s/p TURP, OSA on CPAP, restless leg syndrome, and chronic back pain on oxymorphone. Is is s/p nephrectomy for "kidney cysts." He previously lived in independent housing at [facility] for 5 years prior to his diagnosis; he ambulated independently with a walker and had assistance with meal prep.  He has remote alcohol history, quit drinking 35 years ago.  He is a never smoker.  He is widowed.  Has 2 children, one living and healthy, one deceased from blood clot.  Family history of "bone" cancer in his mother, and prostate and a great-great grandfather.   Today he reports a slow recovery, energy level is low but improving.  He receives PT therapy daily except on weekends.  His legs are weak, presents in a wheelchair today.  Appetite is low but improving, not back to baseline.  Was previously on Remeron for appetite stimulant but not currently taking.  He has chronic constipation uses MiraLAX and prune juice as needed.  No recent blood in stool.  Denies abdominal pain, nausea or vomiting.  No recent fever, cough, dyspnea, chest pain, or  wheezing.  CURRENT THERAPY: Adjuvant Gleevec, starting at 21m daily started on 05/26/2018 and gradually increased to 4067mdaily on 06/23/2018  INTERVAL HISTORY: MiGavino Fouchs a [age].o. male who is here today for follow-up. He is here today with his son. He feels well over all. He's been doing well with PT. His son says that he easily gets skin infections. He had a left infection recently and used oral antibiotics. No side effects of Gleevec.He complains of constipation, he tried Senna 2 pills at night and one in the morning, but it's not helping.      MEDICAL HISTORY:  Past Medical History:  Diagnosis Date  . Anemia   . Asthma    AS A CHILD  . Blind left eye   . [facility]   PROSTATE CANCER  . Cellulitis    frequently, "all over"  . Chronic kidney disease   . Chronic pain syndrome   . Constipation   . DDD (degenerative disc disease), lumbar   . Gastrointestinal stromal tumor (GIST) of stomach ([facility]  . GI bleeding   . History of blood transfusion    01/2018  . Insomnia   . Kyphoscoliosis   . Mass in the abdomen 01/2018  . Restless leg   . Sleep apnea    does not wear CPAP  . Wears glasses     SURGICAL HISTORY: Past Surgical History:  Procedure Laterality Date  . CHOLECYSTECTOMY    .  COLONOSCOPY    . ESOPHAGOGASTRODUODENOSCOPY (EGD) WITH PROPOFOL Left 02/16/2018   Procedure: ESOPHAGOGASTRODUODENOSCOPY (EGD) WITH PROPOFOL;  Surgeon: [name], MD;  Location: [facility];  Service: Gastroenterology;  Laterality: Left;  . LAPAROSCOPIC GASTRECTOMY N/A 03/12/2018   Procedure: LAPAROSCOPIC ASSISTED PARTIAL GASTRECTOMY ERAS PATHWAY;  Surgeon: Stark Klein, MD;  Location: [place];  Service: General;  Laterality: N/A;  . LAPAROSCOPIC PARTIAL GASTRECTOMY  03/12/2018   LAPAROSCOPIC ASSISTED PARTIAL GASTRECTOMY ERAS PATHWAY  . PROSTATECTOMY    . QUADRICEPS TENDON REPAIR Left 07/07/2017   Procedure: REPAIR QUADRICEP TENDON;  Surgeon: Rod Can, MD;  Location: [place];   Service: Orthopedics;  Laterality: Left;  . RIGHT CATARACT EXTRACTION  2013  . RIGTH NEPHRECTOMY  2006  . TONSILLECTOMY    . TRANSURETHRAL RESECTION OF PROSTATE  1997    SOCIAL HISTORY: Social History   Socioeconomic History  . Marital status: Widowed    Spouse name: Not on file  . Number of children: 2  . Years of education: Not on file  . Highest education level: Not on file  Occupational History  . Not on file  Social Needs  . Financial resource strain: Not on file  . Food insecurity:    Worry: Not on file    Inability: Not on file  . Transportation needs:    Medical: Not on file    Non-medical: Not on file  Tobacco Use  . Smoking status: Never Smoker  . Smokeless tobacco: Never Used  Substance and Sexual Activity  . Alcohol use: No    Alcohol/week: 0.0 oz    Comment: quit 1985, 2 fifths/ week,S/P rehab  . Drug use: No  . Sexual activity: Never  Lifestyle  . Physical activity:    Days per week: Not on file    Minutes per session: Not on file  . Stress: Not on file  Relationships  . Social connections:    Talks on phone: Not on file    Gets together: Not on file    Attends religious service: Not on file    Active member of club or organization: Not on file    Attends meetings of clubs or organizations: Not on file    Relationship status: Not on file  . Intimate partner violence:    Fear of current or ex partner: Not on file    Emotionally abused: Not on file    Physically abused: Not on file    Forced sexual activity: Not on file  Other Topics Concern  . Not on file  Social History Narrative   Caffeine 2-3 cups average.  Widowed,  Lives at [facility] [place].  Retired Forensic psychologist.  One son.    FAMILY HISTORY: Family History  Problem Relation Age of Onset  . Bone cancer Mother   . Hypertension Mother   . Arthritis Mother   . Clotting disorder Daughter        died of blood clot     ALLERGIES:  is allergic to coconut flavor [flavoring  agent]; coconut oil; and food.  MEDICATIONS:  Current Outpatient Medications  Medication Sig Dispense Refill  . bacitracin ointment Apply 1 application topically 2 (two) times daily.    . cetaphil (CETAPHIL) lotion Apply 1 application topically 4 (four) times daily as needed (FOR DRY SKIN.). 236 mL 0  . doxycycline (VIBRAMYCIN) 50 MG capsule Take 100 mg by mouth 2 (two) times daily.    Marland Kitchen gabapentin (NEURONTIN) 300 MG capsule Take 1 capsule (300 mg total)  by mouth 3 (three) times daily.    Marland Kitchen imatinib (GLEEVEC) 100 MG tablet WEEK 1 & 2: TAKE 2 TABS (100MG) BY MOUTH DAILY. WEEK 3 & 4: TAKE 3 TAB (300MG) DAILY. TAKE IMMEDIATELY AFTER MEALS WITH LARGE GLASS OF WATER 70 tablet 0  . mirtazapine (REMERON) 7.5 MG tablet Take 1 tablet (7.5 mg total) by mouth at bedtime. 30 tablet 1  . pantoprazole (PROTONIX) 40 MG tablet Take 1 tablet (40 mg total) by mouth daily.    . polyethylene glycol (MIRALAX / GLYCOLAX) packet Take 17 g by mouth daily.    . polyvinyl alcohol (LIQUIFILM TEARS) 1.4 % ophthalmic solution Place 1-2 drops into both eyes 3 (three) times daily as needed (FOR DRY EYES.). 15 mL 0  . protein supplement shake (PREMIER PROTEIN) LIQD Take 325 mLs (11 oz total) by mouth 2 (two) times daily between meals.  0  . senna-docusate (SENOKOT-S) 8.6-50 MG tablet Take 1 tablet by mouth at bedtime as needed for mild constipation.    Marland Kitchen acetaminophen (TYLENOL) 650 MG suppository Place 1 suppository (650 mg total) rectally every 4 (four) hours as needed for fever. (Patient not taking: Reported on 05/25/2018) 12 suppository 0  . feeding supplement, GLUCERNA SHAKE, (GLUCERNA SHAKE) LIQD Take 237 mLs by mouth 2 (two) times daily between meals. (Patient not taking: Reported on 04/22/2018)  0  . ferrous sulfate 325 (65 FE) MG EC tablet Take 325 mg by mouth daily with breakfast.    . guaiFENesin (ROBITUSSIN) 100 MG/5ML SOLN Take 5 mLs (100 mg total) by mouth every 4 (four) hours as needed for cough or to loosen phlegm.  (Patient not taking: Reported on 04/22/2018) 1200 mL 0  . ipratropium-albuterol (DUONEB) 0.5-2.5 (3) MG/3ML SOLN Take 3 mLs by nebulization every 6 (six) hours as needed.    . loperamide (IMODIUM) 2 MG capsule Take 1 capsule (2 mg total) by mouth as needed for diarrhea or loose stools. (Patient not taking: Reported on 05/03/2018) 30 capsule 0  . Melatonin 3 MG TABS Take 1 tablet (3 mg total) by mouth at bedtime. (Patient not taking: Reported on 05/25/2018)  0  . methocarbamol (ROBAXIN) 500 MG tablet Take 1 tablet (500 mg total) by mouth every 8 (eight) hours as needed for muscle spasms. (Patient not taking: Reported on 05/03/2018)    . Multiple Vitamin (MULTIVITAMIN WITH MINERALS) TABS tablet Take 1 tablet by mouth daily. (Patient not taking: Reported on 05/25/2018)    . oxymorphone (OPANA ER) 10 MG 12 hr tablet Take 10 mg by mouth 4 (four) times daily as needed for pain.    . predniSONE (DELTASONE) 10 MG tablet Take 10-50 mg by mouth See admin instructions. Taper down from 50 mg every 2 days     No current facility-administered medications for this visit.     REVIEW OF SYSTEMS:  Constitutional: Denies fevers, chills or abnormal night sweats (+) loss of appetite (+) fatigue, slowly improving (+) ambulates with wheelchair Respiratory: Denies cough, dyspnea or wheezes Cardiovascular: Denies palpitation, chest discomfort or lower extremity swelling Gastrointestinal:  Denies nausea, vomiting, diarrhea, abdominal pain, hematochezia, heartburn or change in bowel habits (+) chronic constipation Skin: Denies abnormal skin rashes. Lymphatics: Denies new lymphadenopathy or easy bruising Neurological:Denies numbness, tingling  Behavioral/Psych: Mood is stable, no new changes  MSK: (+) chronic back pain (+) restless leg syndrome (+) left leg infection and bruising  All other systems were reviewed with the patient and are negative.  PHYSICAL EXAMINATION: ECOG PERFORMANCE STATUS: 3  Vitals:   06/22/18 1258   BP: (!) 107/51  Pulse: 68  Resp: 18  Temp: 98.5 F (36.9 C)  SpO2: 97%   Filed Weights   06/22/18 1258  Weight: 143 lb 14.4 oz (65.3 kg)    GENERAL:alert, no distress (+) wheelchair SKIN: no rashes or significant lesions  EYES: normal, conjunctiva are pink and non-injected, sclera clear OROPHARYNX:no thrush or ulcers    LYMPH:  no palpable cervical, supraclavicular, or axillary lymphadenopathy  LUNGS: clear to auscultation with normal breathing effort HEART: regular rate & rhythm and no murmurs and no lower extremity edema ABDOMEN:abdomen soft, non-tender and normal bowel sounds. (+) midline abdominal incision is healing well, mild erythema, no drainage.  Musculoskeletal:no cyanosis of digits and no clubbing  (+) left leg redness and bruising PSYCH: alert & oriented x 3 with fluent speech NEURO: generalized weakness  LABORATORY DATA:  I have reviewed the data as listed CBC Latest Ref Rng & Units 06/22/2018 05/25/2018 05/03/2018  WBC 4.0 - 10.3 K/uL 6.2 7.9 8.2  Hemoglobin 13.0 - 17.1 g/dL 12.0(L) 12.0(L) 11.1(L)  Hematocrit 38.4 - 49.9 % 38.4 38.9 37.2(L)  Platelets 140 - 400 K/uL 139(L) 169 250   CMP Latest Ref Rng & Units 06/22/2018 05/25/2018 05/03/2018  Glucose 70 - 99 mg/dL 100(H) 109 135(H)  BUN 8 - 23 mg/dL _0 Creatinine 0.61 - 1.24 mg/dL 1.01 1.00 0.82  Sodium 135 - 145 mmol/L 138 141 133(L)  Potassium 3.5 - 5.1 mmol/L 4.6 4.3 4.6  Chloride 98 - 111 mmol/L 104 108 100(L)  CO2 22 - 32 mmol/L _1 Calcium 8.9 - 10.3 mg/dL 8.7(L) 8.9 8.6(L)  Total Protein 6.5 - 8.1 g/dL 6.0(L) 6.1(L) 6.7  Total Bilirubin 0.3 - 1.2 mg/dL 0.2(L) 0.3 0.5  Alkaline Phos 38 - 126 U/L 91 83 76  AST 15 - 41 U/L _2 ALT 0 - 44 U/L _3 PATHOLOGY  Diagnosis 03/12/2018 Stomach, resection for tumor, Lesser curvature - GASTROINTESTINAL STROMAL TUMOR, 13.8 CM. - SEE ONCOLOGY TABLE. Microscopic Comment GASTROINTESTINAL STROMAL TUMOR (GIST): Procedure: Resection of  lesser gastric curvature mass. Tumor Site: Stomach, lesser curvature. Specify: Lesser curvature. Tumor Size: 13.8 cm. Tumor Focality: Unifocal GIST Subtype (spindle, epithelioid, mixed, etc.): Predominately spindled. Mitotic Rate: Greater than 20/50 HIGH POWER FIELD Necrosis: Present, focal. Histologic Grade: G2. G1: Low grade; mitotic rate 5/50 HIGH POWER FIELD. G2: High grade; mitotic rate >5/50 HIGH POWER FIELD. Risk Assessment: High risk. Margins: Free of tumor. Distance of tumor from closest margin: Less than 0.2 cm. Ancillary testing: Immunohistochemistry, see comment. Lymph nodes: number examined: 0; number positive: N/A Pathologic Staging: pT4 pNX. Comments: The tumor is 13.8 cm in greatest dimension and is associated with mucosal ulceration which extends into the underlying mass. The tumor has a predominate spindle cell pattern and the mitotic rate is greater than 20/50 high power fields. There is hypercellularity and variable nuclear atypia. Immunohistochemistry is performed and the tumor is positive with CD117 (C-Kit) and CD34 and negative with cytokeratin AE1/AE3. Ki-67 immunohistochemistry shows a Ki-67 index of 20%. (JDP:gt, 03/16/18)ss and Clin Specimen(s) Obtained: Stomach, resection for tumor, Lesser curvature Specimen Clinical Information GIST stomach gastrointestinal stromal tumor (tl) Gross Received fresh is a 13.8 x 9.2 x 6.7 cm firm nodular mass with an attached 7.0 x 6.5 cm portion of stomach, clinically lesser curvature. The gastric mucosa shows a central 1.2 x 0.5 cm ulcer. The ulcer extends into the  nodular mass. The cut surface of the mass is tan-white with a 7.0 cm cavitary defect containing bloody fluid and clotted blood. There are focal areas of calcification. There is a line of staples present along one edge of the portion of stomach. Sections are submitted in ten cassettes. A-B = sections parallel to stapled gastric mucosa C = section of mass to include  overlying ulcerated gastric mucosa D-J = sections of nodular mass. (GP:ah 03/13/18) Stain(s) used in Diagnosis: The following stain(s) were used in diagnosing the case: CD117 (C-KIT), KI-67-NO ACIS, CD 34, CK AE1AE3. The control(s) stained appropriately.  Diagnosis 02/19/2018 Soft Tissue Needle Core Biopsy, gastric - FINDINGS CONSISTENT WITH GASTROINTESTINAL STROMAL TUMOR (GIST). Microscopic Comment Dr. Vicente Males has reviewed the case and concurs with this interpretation. Specimen(s) Obtained: Soft Tissue Needle Core Biopsy, gastric Specimen Clinical Information gastric mass (cm) Gross The specimen is received in formalin and consists of four cores of tan-white to red soft tissue, ranging from 0.6 to 2.7 cm in length x 0.1 cm in diameter. The specimen is entirely submitted in two cassettes.  PROCEDURES:   02/16/2018 Upper Endoscopy per Dr. [name] Impression: - Normal upper third of esophagus and middle third of esophagus. - 5 cm hiatal hernia. - Widely patent Schatzki ring. - Clotted blood in the gastric body. - Rule out malignancy, gastric tumor on the lesser curvature of the stomach. Injected. Clips were placed. - Normal examined duodenum. - No specimens collected.   RADIOGRAPHIC STUDIES: I have personally reviewed the radiological images as listed and agreed with the findings in the report.  05/03/2018 CT CAP IMPRESSION: CTA chest: 1. No pulmonary embolus. 2. Debris within the right lower lobe bronchus with associated right lower lobe opacity, this can be seen with aspiration. 3. Improved right upper lobe aeration from prior consistent with resolving pneumonia. Near completely resolved right pleural effusion. 4. Chronic bilateral lower lobe bronchiectasis. Bilateral lower lobe airspace disease with some improvement from most recent prior. 5. Coronary artery calcifications. Aortic Atherosclerosis (ICD10-I70.0).  CT abdomen/pelvis: 1. Large colonic stool burden,  suggesting constipation. No bowel obstruction. 2. Post recent partial gastrectomy without evident complication. No intra-abdominal abscess. 3. Additional stable chronic findings as described. Aortic Atherosclerosis (ICD10-I70.0).  ASSESSMENT & PLAN:  [place]y Gates is a pleasant 84 y.o. caucasian male with PMH of prostate cancer presented with decreased appetite, abdominal fullness, GI bleeding, and anemia found to have large malignant mass in the stomach s/p partial gastrectomy  1. Gastrointestinal Stromal Tumor (GIST) of the lesser curvature of the stomach, 13.8 cm, pT4pNX, G2, mitotic rate >20/50 high power field, stage IIIB -We reviewed his medical records including labs, imaging, and pathology in detail with the patient and his son. He underwent complete resection per Dr. Barry Dienes on 03/12/18. Pathology revealed a large 13.8 cm GIST in the stomach. IHC was performed and the tumor is positive with CD117 (C-Kit). 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 His risk of recurrence is around 86%.  -He has started adjuvant Gleevec, he has been tolerating low-dose well in the past months, I will increase his dose to full dose 400 mg daily from tomorrow  -Due to her advanced age, comorbidities, and limited performance status, if he does have significant side effects from [employer], I have low threshold to stop it. -f/u in one month   2. Anemia, secondary to blood loss -He initially presented  with GI bleed and symptomatic anemia, Hgb 7.7 on 02/16/18; he was transfused total 3 units with improvement to 9.2 -Hgb began to trend down again at the time of his surgery in 03/2018; Hgb 6.6 one week post-op. Hgb up to 8.3 at discharge.  -He was prescribed oral iron therapy 1 tab daily beginning in 04/2018; iron studies not done initially  -Rechecked CBC today, Hgb improved to 12, iron  study showed mild iron deficiency -Continue oral ferrous sulfate daily  3. Chronic constipation -He uses miralax and prune juice PRN -He is on chronic opioid use with oxymorphone for chronic back pain, this is contributing to constipation.  -Denies further rectal bleeding.  -I encouraged him to use laxative and stool softener routinely to maintain regular BM, increase water intake and mobility.  -He uses Senna pills 3 times daily. I adviced to increase the dose to 6 tabs/day, and his primary care physician also gave him some sample Linzess, I encouraged him to try   4. Deconditioning, fatigue and low appetite  -He was at [facility] where he participates in daily PT; he requires assistance with all activities then. He is now back to independent housing, and that his son is considering moving him to assisted living, due to his recent fall. -I previously called in mirtazapine 7.5 mg daily for his low appetite -overall improved lately   5. Left leg cellulitis  -His son showed me a picture of his father's recent leg infection, that was red and swollen. He received oral antibiotics doxycycline which was prescribed by his primary care physician. -Is resolved near now   PLAN: -He will increase Gleevec to 400 mg daily tomorrow -I will see him back in 4 weeks -Constipation management reviewed with patient and his son again.    No orders of the defined types were placed in this encounter.   All questions were answered. The patient knows to call the clinic with any problems, questions or concerns. I spent 20 minutes counseling the patient face to face. The total time spent in the appointment was 25 minutes and more than 50% was on counseling, review of test results, and coordination of care.   Dierdre Searles Dweik am acting as scribe for Dr. Truitt Merle.  I have reviewed the above documentation for accuracy and completeness, and I agree with the above.   Truitt Merle, MD 06/22/2018

## 2018-06-19 MED FILL — IMATINIB MESYLATE 100 MG TA: 100 | 28 days supply | Qty: 70 | Fill #0

## 2018-06-22 ENCOUNTER — Inpatient Hospital Stay (HOSPITAL_BASED_OUTPATIENT_CLINIC_OR_DEPARTMENT_OTHER): Payer: Medicare Other | Admitting: Hematology

## 2018-06-22 ENCOUNTER — Inpatient Hospital Stay: Payer: Medicare Other | Attending: Oncology

## 2018-06-22 ENCOUNTER — Encounter: Payer: Self-pay | Admitting: Hematology

## 2018-06-22 VITALS — BP 107/51 | HR 68 | Temp 98.5°F | Resp 18 | Ht 69.0 in | Wt 143.9 lb

## 2018-06-22 DIAGNOSIS — G8929 Other chronic pain: Secondary | ICD-10-CM

## 2018-06-22 DIAGNOSIS — K449 Diaphragmatic hernia without obstruction or gangrene: Secondary | ICD-10-CM | POA: Diagnosis not present

## 2018-06-22 DIAGNOSIS — K222 Esophageal obstruction: Secondary | ICD-10-CM | POA: Diagnosis not present

## 2018-06-22 DIAGNOSIS — Z8546 Personal history of malignant neoplasm of prostate: Secondary | ICD-10-CM

## 2018-06-22 DIAGNOSIS — Z79899 Other long term (current) drug therapy: Secondary | ICD-10-CM | POA: Insufficient documentation

## 2018-06-22 DIAGNOSIS — M5136 Other intervertebral disc degeneration, lumbar region: Secondary | ICD-10-CM

## 2018-06-22 DIAGNOSIS — I2510 Atherosclerotic heart disease of native coronary artery without angina pectoris: Secondary | ICD-10-CM

## 2018-06-22 DIAGNOSIS — F1021 Alcohol dependence, in remission: Secondary | ICD-10-CM

## 2018-06-22 DIAGNOSIS — G2581 Restless legs syndrome: Secondary | ICD-10-CM | POA: Insufficient documentation

## 2018-06-22 DIAGNOSIS — K5900 Constipation, unspecified: Secondary | ICD-10-CM | POA: Insufficient documentation

## 2018-06-22 DIAGNOSIS — J90 Pleural effusion, not elsewhere classified: Secondary | ICD-10-CM

## 2018-06-22 DIAGNOSIS — C49A2 Gastrointestinal stromal tumor of stomach: Secondary | ICD-10-CM

## 2018-06-22 DIAGNOSIS — G4730 Sleep apnea, unspecified: Secondary | ICD-10-CM | POA: Insufficient documentation

## 2018-06-22 DIAGNOSIS — I700 Atherosclerosis of aorta: Secondary | ICD-10-CM

## 2018-06-22 DIAGNOSIS — M549 Dorsalgia, unspecified: Secondary | ICD-10-CM | POA: Diagnosis not present

## 2018-06-22 DIAGNOSIS — Z808 Family history of malignant neoplasm of other organs or systems: Secondary | ICD-10-CM

## 2018-06-22 DIAGNOSIS — D500 Iron deficiency anemia secondary to blood loss (chronic): Secondary | ICD-10-CM

## 2018-06-22 LAB — CMP (CANCER CENTER ONLY)
ALBUMIN: 3.5 g/dL (ref 3.5–5.0)
ALT: 23 U/L (ref 0–44)
AST: 26 U/L (ref 15–41)
Alkaline Phosphatase: 91 U/L (ref 38–126)
Anion gap: 4 — ABNORMAL LOW (ref 5–15)
BILIRUBIN TOTAL: 0.2 mg/dL — AB (ref 0.3–1.2)
BUN: 18 mg/dL (ref 8–23)
CALCIUM: 8.7 mg/dL — AB (ref 8.9–10.3)
CO2: 30 mmol/L (ref 22–32)
CREATININE: 1.01 mg/dL (ref 0.61–1.24)
Chloride: 104 mmol/L (ref 98–111)
GFR, Estimated: >60 mL/min (ref >60)
GLUCOSE: 100 mg/dL — AB (ref 70–99)
Potassium: 4.6 mmol/L (ref 3.5–5.1)
SODIUM: 138 mmol/L (ref 135–145)
TOTAL PROTEIN: 6 g/dL — AB (ref 6.5–8.1)

## 2018-06-22 LAB — CBC WITH DIFFERENTIAL (CANCER CENTER ONLY)
Basophils Absolute: 0 10*3/uL (ref 0.0–0.1)
Basophils Relative: 1 %
EOS ABS: 0.4 10*3/uL (ref 0.0–0.5)
Eosinophils Relative: 7 %
HEMATOCRIT: 38.4 % (ref 38.4–49.9)
Hemoglobin: 12 g/dL — ABNORMAL LOW (ref 13.0–17.1)
LYMPHS ABS: 2.4 10*3/uL (ref 0.9–3.3)
Lymphocytes Relative: 39 %
MCH: 26 pg — AB (ref 27.2–33.4)
MCHC: 31.3 g/dL — AB (ref 32.0–36.0)
MCV: 83.1 fL (ref 79.3–98.0)
MONOS PCT: 12 %
Monocytes Absolute: 0.8 10*3/uL (ref 0.1–0.9)
NEUTROS PCT: 41 %
Neutro Abs: 2.6 10*3/uL (ref 1.5–6.5)
Platelet Count: 139 10*3/uL — ABNORMAL LOW (ref 140–400)
RBC: 4.62 MIL/uL (ref 4.20–5.82)
RDW: 19.5 % — ABNORMAL HIGH (ref 11.0–14.6)
WBC: 6.2 10*3/uL (ref 4.0–10.3)

## 2018-06-22 LAB — IRON AND TIBC
Iron: 61 ug/dL (ref 42–163)
Saturation Ratios: 24 % — ABNORMAL LOW (ref 42–163)
TIBC: 256 ug/dL (ref 202–409)
UIBC: 196 ug/dL

## 2018-06-22 LAB — FERRITIN: Ferritin: 30 ng/mL (ref 24–336)

## 2018-06-23 ENCOUNTER — Telehealth: Payer: Self-pay | Admitting: Hematology

## 2018-06-23 NOTE — Telephone Encounter (Signed)
appts scheduled per 7/22 los - pt son aware of appt date and time.

## 2018-06-24 ENCOUNTER — Other Ambulatory Visit: Payer: Self-pay | Admitting: Pharmacist

## 2018-06-26 ENCOUNTER — Telehealth: Payer: Self-pay | Admitting: Pharmacist

## 2018-06-26 DIAGNOSIS — C49A2 Gastrointestinal stromal tumor of stomach: Secondary | ICD-10-CM

## 2018-06-26 MED ORDER — IMATINIB MESYLATE 100 MG PO TABS: ORAL_TABLET | ORAL | 0 refills | Status: DC

## 2018-06-26 NOTE — Telephone Encounter (Signed)
Oral Chemotherapy Pharmacist Encounter   Spoke with patient's son, [name], with questions about [place] administration.  [name] states the directions for [place] administration are not clear. Patient has been taking imatinib 100 mg tablets, 1 tablet by mouth 3 times daily after food. [name] states that they did not understand that patient should be taking his medication once daily. I discussed with patient son administration of imatinib.  New prescription for Gleevec 100 mg tablets, take 4 tablets (400 mg) by mouth once daily after food with a glass of water has been E scribed to the [facility]  I discussed with [name] that we will change his dad's prescription to the 400 mg tablets, take 1 tablet by mouth once daily after food and with a glass of water once we know that he can tolerate the 400 mg at one time.  An email with the above information for [place] administration will be sent to patient's son.  [name] knows to call the office with questions or concerns. Oral Oncology Clinic will continue to follow.  Thank you,  Johny Drilling, PharmD, BCPS, BCOP  06/26/2018 2:56 PM Oral Oncology Clinic [phone]

## 2018-06-29 ENCOUNTER — Telehealth: Payer: Self-pay | Admitting: Pharmacist

## 2018-06-29 NOTE — Telephone Encounter (Signed)
Oral Chemotherapy Pharmacist Encounter   [name] reached out to clinic today with a question about [place] administration. I returned his call. As previously document in the 06/26/18 telephone encounter, Casey Gates was spacing his Gleevec tablets through out the day instead of taking all of the tablets in one sitting. According to his son, he worked up to 3 tablets a day. His son was concerned about having his father take 4 tablets once daily when he was previously taking only one at a time. Discussed with [name] that his father was taking a totally of 300mg  in a day and he should tolerate going up to 400mg  even with the change of taking the 400mg  once daily.   He knows to call the clinic with any concerns.    Darl Pikes, PharmD, BCPS, [facility] Hematology/Oncology Clinical Pharmacist [facility] [phone]  06/29/2018 1:50 PM

## 2018-07-07 MED FILL — IMATINIB MESYLATE 100 MG TA: 100 | 30 days supply | Qty: 120 | Fill #0

## 2018-07-08 ENCOUNTER — Emergency Department (HOSPITAL_COMMUNITY)
Admission: EM | Admit: 2018-07-08 | Discharge: 2018-07-08 | Disposition: A | Discharge status: Home / Self Care | Payer: Medicare Other | Attending: Emergency Medicine | Admitting: Emergency Medicine

## 2018-07-08 ENCOUNTER — Emergency Department (HOSPITAL_COMMUNITY): Payer: Medicare Other

## 2018-07-08 ENCOUNTER — Other Ambulatory Visit: Payer: Self-pay

## 2018-07-08 ENCOUNTER — Encounter (HOSPITAL_COMMUNITY): Payer: Self-pay

## 2018-07-08 DIAGNOSIS — N189 Chronic kidney disease, unspecified: Secondary | ICD-10-CM | POA: Diagnosis not present

## 2018-07-08 DIAGNOSIS — J45909 Unspecified asthma, uncomplicated: Secondary | ICD-10-CM | POA: Insufficient documentation

## 2018-07-08 DIAGNOSIS — W06XXXA Fall from bed, initial encounter: Secondary | ICD-10-CM | POA: Diagnosis not present

## 2018-07-08 DIAGNOSIS — W19XXXA Unspecified fall, initial encounter: Secondary | ICD-10-CM

## 2018-07-08 DIAGNOSIS — Z79899 Other long term (current) drug therapy: Secondary | ICD-10-CM | POA: Diagnosis not present

## 2018-07-08 DIAGNOSIS — Y9389 Activity, other specified: Secondary | ICD-10-CM | POA: Diagnosis not present

## 2018-07-08 DIAGNOSIS — Y999 Unspecified external cause status: Secondary | ICD-10-CM | POA: Diagnosis not present

## 2018-07-08 DIAGNOSIS — Z8546 Personal history of malignant neoplasm of prostate: Secondary | ICD-10-CM | POA: Insufficient documentation

## 2018-07-08 DIAGNOSIS — Z85028 Personal history of other malignant neoplasm of stomach: Secondary | ICD-10-CM | POA: Diagnosis not present

## 2018-07-08 DIAGNOSIS — Y92009 Unspecified place in unspecified non-institutional (private) residence as the place of occurrence of the external cause: Secondary | ICD-10-CM

## 2018-07-08 DIAGNOSIS — Y92003 Bedroom of unspecified non-institutional (private) residence as the place of occurrence of the external cause: Secondary | ICD-10-CM | POA: Insufficient documentation

## 2018-07-08 DIAGNOSIS — M25551 Pain in right hip: Secondary | ICD-10-CM | POA: Diagnosis present

## 2018-07-08 NOTE — ED Triage Notes (Signed)
Patient reports that he was sitting on the side of the bed and slipped off the bed 4 days ago. Patient c/o right hip pain that increases with movement.

## 2018-07-08 NOTE — ED Provider Notes (Signed)
[facility] DEPT Provider Note   CSN: [id number] Arrival date & time: 07/08/18  6384     History   Chief Complaint Chief Complaint  Patient presents with  . Fall    HPI Casey Gates is a 84 y.o. male.  The history is provided by the patient and medical records. No language interpreter was used.  Hip Pain  This is a new problem. The current episode started more than 2 days ago. The problem occurs constantly. The problem has not changed since onset.Pertinent negatives include no chest pain, no abdominal pain, no headaches and no shortness of breath. The symptoms are aggravated by walking. The symptoms are relieved by ice. He has tried a cold compress and rest for the symptoms. The treatment provided mild relief.    Past Medical History:  Diagnosis Date  . Anemia   . Asthma    AS A CHILD  . Blind left eye   . Cancer [facility])    PROSTATE CANCER  . Cellulitis    frequently, "all over"  . Chronic kidney disease   . Chronic pain syndrome   . Constipation   . DDD (degenerative disc disease), lumbar   . Gastrointestinal stromal tumor (GIST) of stomach ([place])   . GI bleeding   . History of blood transfusion    01/2018  . Insomnia   . Kyphoscoliosis   . Mass in the abdomen 01/2018  . Restless leg   . Sleep apnea    does not wear CPAP  . Wears glasses     Patient Active Problem List   Diagnosis Date Noted  . Multifocal pneumonia 03/18/2018  . Sepsis ([place]) 03/18/2018  . Malignant gastrointestinal stromal tumor (GIST) of stomach ([place]) 03/12/2018  . Urinary retention 02/24/2018  . Gastrointestinal stromal tumor (GIST) of stomach ([place])   . Restless leg syndrome   . Acute GI bleeding 02/16/2018  . Acute blood loss anemia 02/16/2018  . Constipation 01/20/2018  . Exertional shortness of breath 12/30/2017  . Rupture of left quadriceps muscle 07/07/2017  . Rupture of left quadriceps tendon 06/26/2017  . Eczema 06/26/2017  . Chest pain 07/29/2015    . OSA on CPAP 07/10/2015  . CPAP use counseling 07/10/2015  . Excessive daytime sleepiness 02/22/2015  . Sleep apnea 02/22/2015  . Snoring 02/22/2015  . Nocturia more than twice per night 02/22/2015  . Syncope 12/31/2012  . Bifascicular bundle branch block 12/31/2012    Class: Acute  . Cellulitis and abscess of leg, except foot 12/04/2012  . Chronic pain 12/04/2012  . Kyphosis 12/04/2012  . H/O unilateral nephrectomy 12/04/2012  . H/O prostate cancer 12/04/2012  . S/P TURP 12/04/2012    Past Surgical History:  Procedure Laterality Date  . CHOLECYSTECTOMY    . COLONOSCOPY    . ESOPHAGOGASTRODUODENOSCOPY (EGD) WITH PROPOFOL Left 02/16/2018   Procedure: ESOPHAGOGASTRODUODENOSCOPY (EGD) WITH PROPOFOL;  Surgeon: [name], MD;  Location: [place];  Service: Gastroenterology;  Laterality: Left;  . LAPAROSCOPIC GASTRECTOMY N/A 03/12/2018   Procedure: LAPAROSCOPIC ASSISTED PARTIAL GASTRECTOMY ERAS PATHWAY;  Surgeon: Stark Klein, MD;  Location: [facility];  Service: General;  Laterality: N/A;  . LAPAROSCOPIC PARTIAL GASTRECTOMY  03/12/2018   LAPAROSCOPIC ASSISTED PARTIAL GASTRECTOMY ERAS PATHWAY  . PROSTATECTOMY    . QUADRICEPS TENDON REPAIR Left 07/07/2017   Procedure: REPAIR QUADRICEP TENDON;  Surgeon: Rod Can, MD;  Location: [facility];  Service: Orthopedics;  Laterality: Left;  . RIGHT CATARACT EXTRACTION  2013  . RIGTH NEPHRECTOMY  2006  .  TONSILLECTOMY    . TRANSURETHRAL RESECTION OF PROSTATE  1997        Home Medications    Prior to Admission medications   Medication Sig Start Date End Date Taking? Authorizing Provider  acetaminophen (TYLENOL) 650 MG suppository Place 1 suppository (650 mg total) rectally every 4 (four) hours as needed for fever. Patient not taking: Reported on 05/25/2018 03/24/18   Katherine Roan, MD  bacitracin ointment Apply 1 application topically 2 (two) times daily.    [provider]  cetaphil (CETAPHIL) lotion Apply 1 application  topically 4 (four) times daily as needed (FOR DRY SKIN.). 03/24/18   Katherine Roan, MD  doxycycline (VIBRAMYCIN) 50 MG capsule Take 100 mg by mouth 2 (two) times daily. 04/24/18   [provider]  feeding supplement, GLUCERNA SHAKE, (GLUCERNA SHAKE) LIQD Take 237 mLs by mouth 2 (two) times daily between meals. Patient not taking: Reported on 04/22/2018 03/24/18   Katherine Roan, MD  ferrous sulfate 325 (65 FE) MG EC tablet Take 325 mg by mouth daily with breakfast.    [provider]  gabapentin (NEURONTIN) 300 MG capsule Take 1 capsule (300 mg total) by mouth 3 (three) times daily. 03/24/18   Katherine Roan, MD  guaiFENesin (ROBITUSSIN) 100 MG/5ML SOLN Take 5 mLs (100 mg total) by mouth every 4 (four) hours as needed for cough or to loosen phlegm. Patient not taking: Reported on 04/22/2018 03/24/18   Katherine Roan, MD  imatinib (GLEEVEC) 100 MG tablet Take 4 tablets (400mg ) by mouth once daily on a full stomach and a glass of water. 06/26/18   Truitt Merle, MD  ipratropium-albuterol (DUONEB) 0.5-2.5 (3) MG/3ML SOLN Take 3 mLs by nebulization every 6 (six) hours as needed.    [provider]  loperamide (IMODIUM) 2 MG capsule Take 1 capsule (2 mg total) by mouth as needed for diarrhea or loose stools. Patient not taking: Reported on 05/03/2018 03/24/18   Katherine Roan, MD  Melatonin 3 MG TABS Take 1 tablet (3 mg total) by mouth at bedtime. Patient not taking: Reported on 05/25/2018 03/24/18   Katherine Roan, MD  methocarbamol (ROBAXIN) 500 MG tablet Take 1 tablet (500 mg total) by mouth every 8 (eight) hours as needed for muscle spasms. Patient not taking: Reported on 05/03/2018 03/24/18   Katherine Roan, MD  mirtazapine (REMERON) 7.5 MG tablet Take 1 tablet (7.5 mg total) by mouth at bedtime. 05/25/18   Truitt Merle, MD  Multiple Vitamin (MULTIVITAMIN WITH MINERALS) TABS tablet Take 1 tablet by mouth daily. Patient not taking: Reported on 05/25/2018 03/25/18    Katherine Roan, MD  oxymorphone (OPANA ER) 10 MG 12 hr tablet Take 10 mg by mouth 4 (four) times daily as needed for pain.    [provider]  pantoprazole (PROTONIX) 40 MG tablet Take 1 tablet (40 mg total) by mouth daily. 03/25/18   Katherine Roan, MD  polyethylene glycol (MIRALAX / GLYCOLAX) packet Take 17 g by mouth daily.    [provider]  polyvinyl alcohol (LIQUIFILM TEARS) 1.4 % ophthalmic solution Place 1-2 drops into both eyes 3 (three) times daily as needed (FOR DRY EYES.). 03/24/18   Katherine Roan, MD  predniSONE (DELTASONE) 10 MG tablet Take 10-50 mg by mouth See admin instructions. Taper down from 50 mg every 2 days 05/03/18   [provider]  protein supplement shake (PREMIER PROTEIN) LIQD Take 325 mLs (11 oz total) by mouth 2 (two)  times daily between meals. 03/24/18   Katherine Roan, MD  senna-docusate (SENOKOT-S) 8.6-50 MG tablet Take 1 tablet by mouth at bedtime as needed for mild constipation. 03/24/18   Katherine Roan, MD    Family History Family History  Problem Relation Age of Onset  . Bone cancer Mother   . Hypertension Mother   . Arthritis Mother   . Clotting disorder Daughter        died of blood clot     Social History Social History   Tobacco Use  . Smoking status: Never Smoker  . Smokeless tobacco: Never Used  Substance Use Topics  . Alcohol use: No    Alcohol/week: 0.0 oz    Comment: quit 1985, 2 fifths/ week,S/P rehab  . Drug use: No     Allergies   Coconut flavor [flavoring agent]; Coconut oil; and Food   Review of Systems Review of Systems  Constitutional: Negative for activity change, chills, diaphoresis, fatigue and fever.  HENT: Negative for congestion and rhinorrhea.   Eyes: Negative for visual disturbance.  Respiratory: Negative for cough, chest tightness, shortness of breath, wheezing and stridor.   Cardiovascular: Negative for chest pain, palpitations and leg swelling.  Gastrointestinal:  Negative for abdominal distention, abdominal pain, blood in stool, constipation, diarrhea, nausea and vomiting.  Genitourinary: Negative for difficulty urinating, dysuria, flank pain and frequency.  Musculoskeletal: Negative for back pain, gait problem, neck pain and neck stiffness.  Skin: Negative for rash and wound.  Neurological: Negative for dizziness, weakness, light-headedness and headaches.  Psychiatric/Behavioral: Negative for agitation.  All other systems reviewed and are negative.    Physical Exam Updated Vital Signs BP 120/61 (BP Location: Right Arm)   Pulse 73   Temp 97.9 F (36.6 C) (Oral)   Resp 16   Ht 5\' 9"  (1.753 m)   Wt 64.9 kg (143 lb)   SpO2 94%   BMI 21.12 kg/m   Physical Exam  Constitutional: He appears well-developed and well-nourished. No distress.  HENT:  Head: Normocephalic and atraumatic.  Mouth/Throat: Oropharynx is clear and moist. No oropharyngeal exudate.  Eyes: Pupils are equal, round, and reactive to light. Conjunctivae and EOM are normal.  Neck: Normal range of motion. Neck supple.  Cardiovascular: Normal rate and regular rhythm.  No murmur heard. Pulmonary/Chest: Effort normal and breath sounds normal. No respiratory distress. He has no wheezes. He exhibits no tenderness.  Abdominal: Soft. There is no tenderness.  Musculoskeletal: He exhibits tenderness and deformity (r leg shorter than L). He exhibits no edema.       Right hip: He exhibits tenderness and deformity. He exhibits normal range of motion, normal strength, no swelling and no laceration.       Legs: Lymphadenopathy:    He has no cervical adenopathy.  Neurological: He is alert. No sensory deficit. He exhibits normal muscle tone.  Skin: Skin is warm and dry. Capillary refill takes less than 2 seconds. He is not diaphoretic. No erythema. No pallor.  Psychiatric: He has a normal mood and affect.  Nursing note and vitals reviewed.    ED Treatments / Results  Labs (all labs  ordered are listed, but only abnormal results are displayed) Labs Reviewed - No data to display  EKG None  Radiology Dg Hip Unilat  With Pelvis 2-3 Views Right  Result Date: 07/08/2018 CLINICAL DATA:  Right hip pain secondary to a fall 4 days ago. EXAM: DG HIP (WITH OR WITHOUT PELVIS) 2-3V RIGHT COMPARISON:  None. FINDINGS: There  is no evidence of hip fracture or dislocation. There is no evidence of arthropathy or other focal bone abnormality of the right hip. Severe degenerative disc disease in the lower lumbar spine. IMPRESSION: No significant abnormality of the right hip. Electronically Signed   By: Lorriane Shire M.D.   On: 07/08/2018 10:33    Procedures Procedures (including critical care time)  Medications Ordered in ED Medications - No data to display   Initial Impression / Assessment and Plan / ED Course  I have reviewed the triage vital signs and the nursing notes.  Pertinent labs & imaging results that were available during my care of the patient were reviewed by me and considered in my medical decision making (see chart for details).     Casey Gates is a 84 y.o. male with a past medical history significant for prostate cancer status post TURP and prostatectomy, sleep apnea on CPAP at night, and  currently undergoing treatment for malignant gastrointestinal stromal tumor of the stomach who presents with right hip pain after a fall.  Patient reports that 4 days ago, he was getting up and rolled out of bed striking his right hip on the side of the bed in the ground.  He denies hitting his head or any loss of consciousness.  He reports he has had severe pain for the last 4 days.  He reports that he went to another facility to be seen but the wait was too long several days ago.  He reports that he is able to get around with his walker with severe pain.  He reports that getting up to a 10 out of 10 when he tries to walk but is currently a 3 out of 10 at rest.  He denies any numbness,  tingling, or weakness of the leg.  He reports chronic constipation but no new incontinence of urine or bowel.  He denies abdominal pain chest pain or other back pain.  He denies any low back pain.  He denies any preceding symptoms or other problems.  On exam, patient has some tenderness with right hip movement.  Minimal tenderness to palpation.  Slight leg length difference on the right compared to left.  Patient normal sensation, strength in the ankle, and pulses distally.  No knee or ankle tenderness to palpation and movement.  No midline back tenderness or lumbar tenderness.  Lungs clear and chest nontender.  Clinically due to the leg length difference I am concerned about hip or pelvis fracture.  Patient with x-ray initially to evaluate.  If x-ray is negative, will discuss possibility of occult fracture and discuss if further imaging is needed to rule out fracture today.  Patient decided he did not want pain medication until images have returned.  Anticipate reassessment after work-up.  12:09 PM X-ray shows no evidence of fracture or dislocation.  Degenerative disc disease was seen.  Patient was reexamined and he was able to move his hip around without significant pain.  Suspect a soft tissue injury.  A shared decision-making conversation was held with family about occult fracture and if further imaging was indicated.  They would rather go home and see his PCP in several days and take his home pain medicine.  They understood that a small fracture may be missed however, they did not want MRI today.    Patient will be discharged home to follow with PCP and was informed if he returns, patient may need MRI of the hip to look for occult injury.  Patient and family agreed with plan of care and return precautions.  Patient had no other worsens or concerns and was discharged in good condition.   Final Clinical Impressions(s) / ED Diagnoses   Final diagnoses:  Fall in home, initial encounter    Right hip pain    ED Discharge Orders    None      Clinical Impression: 1. Fall in home, initial encounter   2. Right hip pain     Disposition: Discharge  Condition: Good  I have discussed the results, Dx and Tx plan with the pt(& family if present). He/she/they expressed understanding and agree(s) with the plan. Discharge instructions discussed at great length. Strict return precautions discussed and pt &/or family have verbalized understanding of the instructions. No further questions at time of discharge.    New Prescriptions   No medications on file    Follow Up: Velna Hatchet, [place] [address] [place] DEPT [place] [id number] [place]       Keyon Liller, Gwenyth Allegra, MD 07/08/18 [phone]

## 2018-07-08 NOTE — ED Notes (Signed)
Pt provided ice water 

## 2018-07-08 NOTE — Discharge Instructions (Signed)
Your x-ray today did not show evidence of fracture or dislocation.  We discussed the paucity of a hidden fracture.  Risks recommend following up with your primary doctor in several days and use her home pain medicine.  Please be careful not to fall.  If any symptoms change or worsen, please return to the nearest emergency department for reevaluation and further management.

## 2018-07-20 NOTE — Progress Notes (Signed)
[facility]  [phone] [phone]  Clinic Follow Up Note   Patient Care Team: Velna Hatchet, MD as PCP - General (Internal Medicine) [name], MD as PCP - Cardiology (Cardiology) 07/23/2018  CHIEF COMPLAINTS:  Follow Up gastric GIST    SUMMARY OF ONCOLOGY HISTORY  Oncology History   Cancer Staging Malignant gastrointestinal stromal tumor (GIST) of stomach ([place]) Staging form: Gastrointestinal Stromal Tumor - Gastric and Omental GIST, AJCC 8th Edition - Pathologic stage from 04/22/2018: Stage IIIB (pT4, pN0, cM0, Mitotic Rate: High) - Signed by Alla Feeling, NP on 04/22/2018       Malignant gastrointestinal stromal tumor (GIST) of stomach ([place])   02/16/2018 Procedure    Upper EGD per Dr. [name] Impression:  - Normal upper third of esophagus and middle third of esophagus. - 5 cm hiatal hernia. - Widely patent Schatzki ring. - Clotted blood in the gastric body. - Rule out malignancy, gastric tumor on the lesser curvature of the stomach. Injected. Clips were placed. - Normal examined duodenum. - No specimens collected.    02/17/2018 Imaging    CT AP IMPRESSION: 1. 9.7 x 8.8 x 10.9 cm aggressive appearing heterogeneously enhancing mass along the lesser curvature of the stomach, highly concerning for primary gastric neoplasm. This is intimately associated with the undersurface of the left lobe of the liver without definitive evidence of direct invasion (although superficial involvement of the hepatic capsule is suspected given the loss of the intervening fat plane). There is also an indeterminate left adrenal nodule measuring 2.0 x 1.4 cm, which could be metastatic. No other definite signs of metastatic disease elsewhere in the abdomen or pelvis. 2. Areas of cylindrical bronchiectasis and chronic scarring in the lower lobes of the lungs bilaterally. 3. Aortic atherosclerosis, in addition to left main and 3 vessel coronary artery  disease. 4. Right kidney is either surgically or congenitally absent, or severely atrophic. 5. Additional incidental findings, as above.    02/19/2018 Initial Biopsy    VIA CT BIOPSY: Diagnosis -  Soft Tissue Needle Core Biopsy, gastric - FINDINGS CONSISTENT WITH GASTROINTESTINAL STROMAL TUMOR (GIST). - SEE COMMENT.    02/19/2018 Pathology Results    Diagnosis 02/19/2018 Soft Tissue Needle Core Biopsy, gastric - FINDINGS CONSISTENT WITH GASTROINTESTINAL STROMAL TUMOR (GIST).    03/12/2018 Initial Diagnosis    Malignant gastrointestinal stromal tumor (GIST) of stomach ([place])    03/12/2018 Pathology Results    Surgical path: Diagnosis Stomach, resection for tumor, Lesser curvature - GASTROINTESTINAL STROMAL TUMOR, 13.8 CM.    03/18/2018 Imaging    CT CAP IMPRESSION: -obtained d/t post op fever, leukocytosis, and respiratory change   Moderate right pleural effusion and small left pleural effusion. Compressive atelectasis versus pneumonia in the lower lobes. Nodular airspace disease throughout much of the right upper lobe and right middle lobe concerning for pneumonia.  Cardiomegaly. Severe/diffuse coronary artery disease. Aortic atherosclerosis. No evidence of aortic aneurysm.  Postoperative changes from recent partial gastrectomy. Small amount of free air in the abdomen, likely related to post op state. No evidence of bowel obstruction.  Prior remote right nephrectomy and prostatectomy.    03/22/2018 Procedure    Thoracentesis  Diagnosis PLEURAL FLUID, RIGHT (SPECIMEN 1 OF 1, COLLECTED ON 03/22/2018): NO MALIGNANT CELLS IDENTIFIED.      04/22/2018 Cancer Staging    Staging form: Gastrointestinal Stromal Tumor - Gastric and Omental GIST, AJCC 8th Edition - Pathologic stage from 04/22/2018: Stage IIIB (pT4, pN0, cM0, Mitotic Rate: High) - Signed  by Alla Feeling, NP on 04/22/2018    05/03/2018 Imaging    05/03/2018 CT Abdomen and Pelvis W/ Contrast IMPRESSION: CTA chest:  1.  No pulmonary embolus. 2. Debris within the right lower lobe bronchus with associated right lower lobe opacity, this can be seen with aspiration. 3. Improved right upper lobe aeration from prior consistent with resolving pneumonia. Near completely resolved right pleural effusion. 4. Chronic bilateral lower lobe bronchiectasis. Bilateral lower lobe airspace disease with some improvement from most recent prior. 5. Coronary artery calcifications. Aortic Atherosclerosis (ICD10-I70.0).  CT abdomen/pelvis:  1. Large colonic stool burden, suggesting constipation. No bowel obstruction. 2. Post recent partial gastrectomy without evident complication. No intra-abdominal abscess. 3. Additional stable chronic findings as described. Aortic Atherosclerosis (ICD10-I70.0).    05/03/2018 Imaging    05/03/2018 CT Angio Chest PE W or WO Contrast IMPRESSION: CTA chest:  1. No pulmonary embolus. 2. Debris within the right lower lobe bronchus with associated right lower lobe opacity, this can be seen with aspiration. 3. Improved right upper lobe aeration from prior consistent with resolving pneumonia. Near completely resolved right pleural effusion. 4. Chronic bilateral lower lobe bronchiectasis. Bilateral lower lobe airspace disease with some improvement from most recent prior. 5. Coronary artery calcifications. Aortic Atherosclerosis (ICD10-I70.0).    05/03/2018 Imaging    05/03/2018 CT CAP IMPRESSION: CTA chest: 1. No pulmonary embolus. 2. Debris within the right lower lobe bronchus with associated right lower lobe opacity, this can be seen with aspiration. 3. Improved right upper lobe aeration from prior consistent with resolving pneumonia. Near completely resolved right pleural effusion. 4. Chronic bilateral lower lobe bronchiectasis. Bilateral lower lobe airspace disease with some improvement from most recent prior. 5. Coronary artery calcifications. Aortic  Atherosclerosis (ICD10-I70.0).  CT abdomen/pelvis: 1. Large colonic stool burden, suggesting constipation. No bowel obstruction. 2. Post recent partial gastrectomy without evident complication. No intra-abdominal abscess. 3. Additional stable chronic findings as described. Aortic Atherosclerosis (ICD10-I70.0).     HISTORY OF PRESENTING ILLNESS:  Casey Gates 84 y.o. male is here because of recent diagnosis of GIST. He presents with his son; was referred by Dr. Barry Dienes. He initially became symptomatic in January 2019 with decreased appetite, abdominal fullness, and intermittent atypical chest pain; he went to ED for evaluation. CTA ruled out PE and he was discharged home with outpatient cardiology f/u.  He then presented to [facility] ED on 02/16/18 for rectal bleeding and hematemesis on aspirin therapy. He was hypotensive with acute anemia in the emergency department, Hgb 7.7 (previously 14.9 in 12/2017). He was stabilized with IVF and 2 units RBCs and admitted for GI bleed work up.  He underwent upper endoscopy per Dr. [name] on 02/16/2018 which showed a large, submucosal partially circumferential mass with no bleeding but stigmata of recent bleeding on the lesser curvature of the stomach.  There was a small area of ulceration with pigmentation noted as well as a large amount of dark red, coffee-ground colored and clotted blood found in the gastric body.  The mass was biopsied, findings consistent with gastrointestinal stromal tumor.  He then underwent staging CT of the abdomen and pelvis on 02/17/2018 which noted the 9.7 x 8.8 x 10.9 enhancing mass along the area of concern which was intimately associated with the undersurface of the left lobe of the liver without definitive evidence of direct invasion.  There was an indeterminate left adrenal nodule measuring 2.0 x 1.4 cm which could be metastatic.  No other evidence of metastatic disease in the abdomen  or pelvis.  Hemoglobin improved and he was  discharged to [place] facility with outpatient surgery follow-up scheduled with Dr. Barry Dienes.  Given his high risk for re-bleeding, he was taken to the operating room by Dr. Barry Dienes on 03/12/18 who performed a laparoscopic assisted converted to open partial gastrectomy. Surgical pathology confirmed gastrointestinal stromal tumor measuring 13.8 cm with associated mucosal ulceration extending into the underlying mass; predominately spindle features, mitotic rate greater than 20/50 high power field, grade 2. Final staging revealed pT4pNX. Post op course was complicated by pneumonia requiring thoracentesis. Cytology was negative for malignant cells. No abdominal complications were noted on postop imaging.  He was discharged to SNF on 03/24/2018 where he currently resides.   He has a personal history of prostate cancer more than 10 years ago s/p TURP, OSA on CPAP, restless leg syndrome, and chronic back pain on oxymorphone. Is is s/p nephrectomy for "kidney cysts." He previously lived in independent housing at [facility] for 5 years prior to his diagnosis; he ambulated independently with a walker and had assistance with meal prep.  He has remote alcohol history, quit drinking 35 years ago.  He is a never smoker.  He is widowed.  Has 2 children, one living and healthy, one deceased from blood clot.  Family history of "bone" cancer in his mother, and prostate and a great-great grandfather.   Today he reports a slow recovery, energy level is low but improving.  He receives PT therapy daily except on weekends.  His legs are weak, presents in a wheelchair today.  Appetite is low but improving, not back to baseline.  Was previously on Remeron for appetite stimulant but not currently taking.  He has chronic constipation uses MiraLAX and prune juice as needed.  No recent blood in stool.  Denies abdominal pain, nausea or vomiting.  No recent fever, cough, dyspnea, chest pain, or wheezing.  CURRENT  THERAPY: Adjuvant Gleevec, starting at '200mg'$  daily started on 05/26/2018 and gradually increased to '400mg'$  daily on 06/23/2018  INTERVAL HISTORY: Casey Gates is a 84 y.o. male who is here today for follow-up. He sustained a fall on 07/08/2018 for which he went to the ER, but had no fractures. He is here with a family member. He complains of appetite loss. He is on mirtazapine, but it's not helping.       MEDICAL HISTORY:  Past Medical History:  Diagnosis Date  . Anemia   . Asthma    AS A CHILD  . Blind left eye   . Cancer [facility])    PROSTATE CANCER  . Cellulitis    frequently, "all over"  . Chronic kidney disease   . Chronic pain syndrome   . Constipation   . DDD (degenerative disc disease), lumbar   . Gastrointestinal stromal tumor (GIST) of stomach ([place])   . GI bleeding   . History of blood transfusion    01/2018  . Insomnia   . Kyphoscoliosis   . Mass in the abdomen 01/2018  . Restless leg   . Sleep apnea    does not wear CPAP  . Wears glasses     SURGICAL HISTORY: Past Surgical History:  Procedure Laterality Date  . CHOLECYSTECTOMY    . COLONOSCOPY    . ESOPHAGOGASTRODUODENOSCOPY (EGD) WITH PROPOFOL Left 02/16/2018   Procedure: ESOPHAGOGASTRODUODENOSCOPY (EGD) WITH PROPOFOL;  Surgeon: [name], MD;  Location: [facility];  Service: Gastroenterology;  Laterality: Left;  . LAPAROSCOPIC GASTRECTOMY N/A 03/12/2018   Procedure: LAPAROSCOPIC ASSISTED  PARTIAL GASTRECTOMY ERAS PATHWAY;  Surgeon: Stark Klein, MD;  Location: [facility];  Service: General;  Laterality: N/A;  . LAPAROSCOPIC PARTIAL GASTRECTOMY  03/12/2018   LAPAROSCOPIC ASSISTED PARTIAL GASTRECTOMY ERAS PATHWAY  . PROSTATECTOMY    . QUADRICEPS TENDON REPAIR Left 07/07/2017   Procedure: REPAIR QUADRICEP TENDON;  Surgeon: Rod Can, MD;  Location: [facility];  Service: Orthopedics;  Laterality: Left;  . RIGHT CATARACT EXTRACTION  2013  . RIGTH NEPHRECTOMY  2006  . TONSILLECTOMY    . TRANSURETHRAL RESECTION OF  PROSTATE  1997    SOCIAL HISTORY: Social History   Socioeconomic History  . Marital status: Widowed    Spouse name: Not on file  . Number of children: 2  . Years of education: Not on file  . Highest education level: Not on file  Occupational History  . Not on file  Social Needs  . Financial resource strain: Not on file  . Food insecurity:    Worry: Not on file    Inability: Not on file  . Transportation needs:    Medical: Not on file    Non-medical: Not on file  Tobacco Use  . Smoking status: Never Smoker  . Smokeless tobacco: Never Used  Substance and Sexual Activity  . Alcohol use: No    Alcohol/week: 0.0 standard drinks    Comment: quit 1985, 2 fifths/ week,S/P rehab  . Drug use: No  . Sexual activity: Never  Lifestyle  . Physical activity:    Days per week: Not on file    Minutes per session: Not on file  . Stress: Not on file  Relationships  . Social connections:    Talks on phone: Not on file    Gets together: Not on file    Attends religious service: Not on file    Active member of club or organization: Not on file    Attends meetings of clubs or organizations: Not on file    Relationship status: Not on file  . Intimate partner violence:    Fear of current or ex partner: Not on file    Emotionally abused: Not on file    Physically abused: Not on file    Forced sexual activity: Not on file  Other Topics Concern  . Not on file  Social History Narrative   Caffeine 2-3 cups average.  Widowed,  Lives at [facility] [place]. Living.  Retired Forensic psychologist.  One son.    FAMILY HISTORY: Family History  Problem Relation Age of Onset  . Bone cancer Mother   . Hypertension Mother   . Arthritis Mother   . Clotting disorder Daughter        died of blood clot     ALLERGIES:  is allergic to coconut flavor [flavoring agent]; coconut oil; and food.  MEDICATIONS:  Current Outpatient Medications  Medication Sig Dispense Refill  . feeding supplement (BOOST HIGH  PROTEIN) LIQD Take 1 Container by mouth daily.    Marland Kitchen gabapentin (NEURONTIN) 300 MG capsule Take 1 capsule (300 mg total) by mouth 3 (three) times daily. (Patient taking differently: Take 300 mg by mouth 2 (two) times daily. )    . imatinib (GLEEVEC) 100 MG tablet Take 4 tablets ('400mg'$ ) by mouth once daily on a full stomach and a glass of water. 120 tablet 0  . mirtazapine (REMERON) 7.5 MG tablet TAKE 1 TABLET(7.5 MG) BY MOUTH AT BEDTIME 30 tablet 0  . oxymorphone (OPANA) 10 MG tablet Take 10 mg by mouth every 6 (  six) hours as needed for pain.     . polyvinyl alcohol (LIQUIFILM TEARS) 1.4 % ophthalmic solution Place 1-2 drops into both eyes 3 (three) times daily as needed (FOR DRY EYES.). 15 mL 0  . protein supplement shake (PREMIER PROTEIN) LIQD Take 325 mLs (11 oz total) by mouth 2 (two) times daily between meals.  0  . senna-docusate (SENOKOT-S) 8.6-50 MG tablet Take 1 tablet by mouth at bedtime as needed for mild constipation.     No current facility-administered medications for this visit.     REVIEW OF SYSTEMS:  Constitutional: Denies fevers, chills or abnormal night sweats (+) loss of appetite (+) fatigue, slowly improving (+) ambulates with wheelchair (+) recent fall Respiratory: Denies cough, dyspnea or wheezes Cardiovascular: Denies palpitation, chest discomfort or lower extremity swelling Gastrointestinal:  Denies nausea, vomiting, diarrhea, abdominal pain, hematochezia, heartburn or change in bowel habits (+) chronic constipation Skin: Denies abnormal skin rashes. (+) left arm bruise due to trauma  Lymphatics: Denies new lymphadenopathy or easy bruising Neurological:Denies numbness, tingling  Behavioral/Psych: Mood is stable, no new changes  MSK: (+) chronic back pain (+) restless leg syndrome   All other systems were reviewed with the patient and are negative.  PHYSICAL EXAMINATION: ECOG PERFORMANCE STATUS: 3   Vitals:   07/23/18 0954  BP: (!) 118/58  Pulse: 82  Resp: 18   Temp: 98.6 F (37 C)  SpO2: 97%   Filed Weights   07/23/18 0954  Weight: 146 lb 4.8 oz (66.4 kg)    GENERAL:alert, no distress (+) wheelchair SKIN: no rashes or significant lesions (+) left arm bruise EYES: normal, conjunctiva are pink and non-injected, sclera clear OROPHARYNX:no thrush or ulcers    LYMPH:  no palpable cervical, supraclavicular, or axillary lymphadenopathy  LUNGS: clear to auscultation with normal breathing effort HEART: regular rate & rhythm and no murmurs and no lower extremity edema ABDOMEN:abdomen soft, non-tender and normal bowel sounds. (+) midline abdominal incision is healing well, mild erythema, no drainage.  Musculoskeletal:no cyanosis of digits and no clubbing   PSYCH: alert & oriented x 3 with fluent speech NEURO: generalized weakness  LABORATORY DATA:  I have reviewed the data as listed CBC Latest Ref Rng & Units 07/23/2018 06/22/2018 05/25/2018  WBC 4.0 - 10.3 K/uL 4.1 6.2 7.9  Hemoglobin 13.0 - 17.1 g/dL 12.3(L) 12.0(L) 12.0(L)  Hematocrit 38.4 - 49.9 % 38.2(L) 38.4 38.9  Platelets 140 - 400 K/uL 123(L) 139(L) 169   CMP Latest Ref Rng & Units 07/23/2018 06/22/2018 05/25/2018  Glucose 70 - 99 mg/dL 113(H) 100(H) 109  BUN 8 - 23 mg/dL '17 18 20  '$ Creatinine 0.61 - 1.24 mg/dL 0.98 1.01 1.00  Sodium 135 - 145 mmol/L 142 138 141  Potassium 3.5 - 5.1 mmol/L 4.3 4.6 4.3  Chloride 98 - 111 mmol/L 105 104 108  CO2 22 - 32 mmol/L '30 30 26  '$ Calcium 8.9 - 10.3 mg/dL 8.9 8.7(L) 8.9  Total Protein 6.5 - 8.1 g/dL 6.1(L) 6.0(L) 6.1(L)  Total Bilirubin 0.3 - 1.2 mg/dL 0.2(L) 0.2(L) 0.3  Alkaline Phos 38 - 126 U/L 94 91 83  AST 15 - 41 U/L '26 26 24  '$ ALT 0 - 44 U/L '25 23 15     '$ PATHOLOGY  Diagnosis 03/12/2018 Stomach, resection for tumor, Lesser curvature - GASTROINTESTINAL STROMAL TUMOR, 13.8 CM. - SEE ONCOLOGY TABLE. Microscopic Comment GASTROINTESTINAL STROMAL TUMOR (GIST): Procedure: Resection of lesser gastric curvature mass. Tumor Site: Stomach,  lesser curvature. Specify: Lesser curvature. Tumor Size: 13.8  cm. Tumor Focality: Unifocal GIST Subtype (spindle, epithelioid, mixed, etc.): Predominately spindled. Mitotic Rate: Greater than 20/50 HIGH POWER FIELD Necrosis: Present, focal. Histologic Grade: G2. G1: Low grade; mitotic rate 5/50 HIGH POWER FIELD. G2: High grade; mitotic rate >5/50 HIGH POWER FIELD. Risk Assessment: High risk. Margins: Free of tumor. Distance of tumor from closest margin: Less than 0.2 cm. Ancillary testing: Immunohistochemistry, see comment. Lymph nodes: number examined: 0; number positive: N/A Pathologic Staging: pT4 pNX. Comments: The tumor is 13.8 cm in greatest dimension and is associated with mucosal ulceration which extends into the underlying mass. The tumor has a predominate spindle cell pattern and the mitotic rate is greater than 20/50 high power fields. There is hypercellularity and variable nuclear atypia. Immunohistochemistry is performed and the tumor is positive with CD117 (C-Kit) and CD34 and negative with cytokeratin AE1/AE3. Ki-67 immunohistochemistry shows a Ki-67 index of 20%. (JDP:gt, 03/16/18)ss and Clin Specimen(s) Obtained: Stomach, resection for tumor, Lesser curvature Specimen Clinical Information GIST stomach gastrointestinal stromal tumor (tl) Gross Received fresh is a 13.8 x 9.2 x 6.7 cm firm nodular mass with an attached 7.0 x 6.5 cm portion of stomach, clinically lesser curvature. The gastric mucosa shows a central 1.2 x 0.5 cm ulcer. The ulcer extends into the nodular mass. The cut surface of the mass is tan-white with a 7.0 cm cavitary defect containing bloody fluid and clotted blood. There are focal areas of calcification. There is a line of staples present along one edge of the portion of stomach. Sections are submitted in ten cassettes. A-B = sections parallel to stapled gastric mucosa C = section of mass to include overlying ulcerated gastric mucosa D-J = sections of  nodular mass. (GP:ah 03/13/18) Stain(s) used in Diagnosis: The following stain(s) were used in diagnosing the case: CD117 (C-KIT), KI-67-NO ACIS, CD 34, CK AE1AE3. The control(s) stained appropriately.  Diagnosis 02/19/2018 Soft Tissue Needle Core Biopsy, gastric - FINDINGS CONSISTENT WITH GASTROINTESTINAL STROMAL TUMOR (GIST). Microscopic Comment Dr. Vicente Males has reviewed the case and concurs with this interpretation. Specimen(s) Obtained: Soft Tissue Needle Core Biopsy, gastric Specimen Clinical Information gastric mass (cm) Gross The specimen is received in formalin and consists of four cores of tan-white to red soft tissue, ranging from 0.6 to 2.7 cm in length x 0.1 cm in diameter. The specimen is entirely submitted in two cassettes.  PROCEDURES:   02/16/2018 Upper Endoscopy per Dr. [name] Impression: - Normal upper third of esophagus and middle third of esophagus. - 5 cm hiatal hernia. - Widely patent Schatzki ring. - Clotted blood in the gastric body. - Rule out malignancy, gastric tumor on the lesser curvature of the stomach. Injected. Clips were placed. - Normal examined duodenum. - No specimens collected.   RADIOGRAPHIC STUDIES: I have personally reviewed the radiological images as listed and agreed with the findings in the report.  05/03/2018 CT CAP IMPRESSION: CTA chest: 1. No pulmonary embolus. 2. Debris within the right lower lobe bronchus with associated right lower lobe opacity, this can be seen with aspiration. 3. Improved right upper lobe aeration from prior consistent with resolving pneumonia. Near completely resolved right pleural effusion. 4. Chronic bilateral lower lobe bronchiectasis. Bilateral lower lobe airspace disease with some improvement from most recent prior. 5. Coronary artery calcifications. Aortic Atherosclerosis (ICD10-I70.0).  CT abdomen/pelvis: 1. Large colonic stool burden, suggesting constipation. No bowel obstruction. 2. Post  recent partial gastrectomy without evident complication. No intra-abdominal abscess. 3. Additional stable chronic findings as described. Aortic Atherosclerosis (ICD10-I70.0).  ASSESSMENT & PLAN:  Casey Gates is a pleasant 84 y.o. caucasian male with PMH of prostate cancer presented with decreased appetite, abdominal fullness, GI bleeding, and anemia found to have large malignant mass in the stomach s/p partial gastrectomy  1. Gastrointestinal Stromal Tumor (GIST) of the lesser curvature of the stomach, 13.8 cm, pT4pNX, G2, mitotic rate >20/50 high power field, stage IIIB -We reviewed his medical records including labs, imaging, and pathology in detail with the patient and his son. He underwent complete resection per Dr. Barry Dienes on 03/12/18. Pathology revealed a large 13.8 cm GIST in the stomach. IHC was performed and the tumor is positive with CD117 (C-Kit). 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 His risk of recurrence is around 86%.  -He has started adjuvant Gleevec, he has been tolerating full dose well overall, and had one episode of diarrhea in the last months, no other noticeable side effects. -Due to her advanced age, comorbidities, and limited performance status, if he does have significant side effects from [place], I have low threshold to stop it. -I advised him to increase dose of mirtazapine for his appetite, and try to be physically active. -Tinea adjuvant Gleevec for 3 years, if he can tolerate. -f/u in 2 months  2. Anemia, secondary to blood loss -He initially presented with GI bleed and symptomatic anemia, Hgb 7.7 on 02/16/18; he was transfused total 3 units with improvement to 9.2 -Hgb began to trend down again at the time of his surgery in 03/2018; Hgb 6.6 one week post-op. Hgb up to 8.3 at discharge.  -He was prescribed oral iron therapy 1 tab  daily beginning in 04/2018; iron studies not done initially  -Rechecked CBC today, Hgb improved to 12.3, iron study still pending -Continue oral ferrous sulfate daily  3. Chronic constipation -He uses miralax and prune juice PRN -He is on chronic opioid use with oxymorphone for chronic back pain, this is contributing to constipation.  -Denies further rectal bleeding.  -I encouraged him to use laxative and stool softener routinely to maintain regular BM, increase water intake and mobility.  -He uses Senna pills 3 times daily. I adviced to increase the dose to 6 tabs/day, and his primary care physician also gave him some sample Linzess, I encouraged him to try   4. Deconditioning, fatigue and low appetite  -He was at [facility] where he participates in daily PT; he requires assistance with all activities then. He is now back to independent housing, and that his son is considering moving him to assisted living, due to his recent fall. -I previously called in mirtazapine 7.5 mg daily for his low appetite, I recommend him to increase to 15 mg daily -overall improved lately     PLAN: -Continue Gleevec 400 mg daily, he is tolerating well.   -Increase mirtazapine to 15 mg daily  -I will see him back in 2 months -Repeat scan every 6-12 months   All questions were answered. The patient knows to call the clinic with any problems, questions or concerns. I spent 20 minutes counseling the patient face to face. The total time spent in the appointment was 25 minutes and more than 50% was on counseling, review of test results, and coordination of care.   Dierdre Searles Dweik am acting as scribe for Dr. Truitt Merle.  I have reviewed the above documentation for accuracy and completeness, and I agree with the above.  Truitt Merle, MD 07/23/2018

## 2018-07-21 ENCOUNTER — Other Ambulatory Visit: Payer: Self-pay | Admitting: Hematology

## 2018-07-23 ENCOUNTER — Inpatient Hospital Stay: Payer: Medicare Other

## 2018-07-23 ENCOUNTER — Inpatient Hospital Stay: Payer: Medicare Other | Attending: Oncology | Admitting: Hematology

## 2018-07-23 ENCOUNTER — Telehealth: Payer: Self-pay | Admitting: Hematology

## 2018-07-23 ENCOUNTER — Encounter: Payer: Self-pay | Admitting: Hematology

## 2018-07-23 VITALS — BP 118/58 | HR 82 | Temp 98.6°F | Resp 18 | Ht 69.0 in | Wt 146.3 lb

## 2018-07-23 DIAGNOSIS — J470 Bronchiectasis with acute lower respiratory infection: Secondary | ICD-10-CM | POA: Insufficient documentation

## 2018-07-23 DIAGNOSIS — Z79891 Long term (current) use of opiate analgesic: Secondary | ICD-10-CM | POA: Diagnosis not present

## 2018-07-23 DIAGNOSIS — K5900 Constipation, unspecified: Secondary | ICD-10-CM | POA: Diagnosis not present

## 2018-07-23 DIAGNOSIS — N189 Chronic kidney disease, unspecified: Secondary | ICD-10-CM | POA: Diagnosis not present

## 2018-07-23 DIAGNOSIS — I2510 Atherosclerotic heart disease of native coronary artery without angina pectoris: Secondary | ICD-10-CM

## 2018-07-23 DIAGNOSIS — Z905 Acquired absence of kidney: Secondary | ICD-10-CM

## 2018-07-23 DIAGNOSIS — G894 Chronic pain syndrome: Secondary | ICD-10-CM | POA: Diagnosis not present

## 2018-07-23 DIAGNOSIS — Z8546 Personal history of malignant neoplasm of prostate: Secondary | ICD-10-CM | POA: Diagnosis not present

## 2018-07-23 DIAGNOSIS — Z808 Family history of malignant neoplasm of other organs or systems: Secondary | ICD-10-CM | POA: Diagnosis not present

## 2018-07-23 DIAGNOSIS — C49A2 Gastrointestinal stromal tumor of stomach: Secondary | ICD-10-CM

## 2018-07-23 DIAGNOSIS — J90 Pleural effusion, not elsewhere classified: Secondary | ICD-10-CM | POA: Diagnosis not present

## 2018-07-23 DIAGNOSIS — K222 Esophageal obstruction: Secondary | ICD-10-CM | POA: Insufficient documentation

## 2018-07-23 DIAGNOSIS — Z79899 Other long term (current) drug therapy: Secondary | ICD-10-CM | POA: Insufficient documentation

## 2018-07-23 DIAGNOSIS — Z8042 Family history of malignant neoplasm of prostate: Secondary | ICD-10-CM

## 2018-07-23 DIAGNOSIS — M5136 Other intervertebral disc degeneration, lumbar region: Secondary | ICD-10-CM | POA: Diagnosis not present

## 2018-07-23 DIAGNOSIS — I517 Cardiomegaly: Secondary | ICD-10-CM | POA: Diagnosis not present

## 2018-07-23 DIAGNOSIS — R5383 Other fatigue: Secondary | ICD-10-CM

## 2018-07-23 DIAGNOSIS — J45909 Unspecified asthma, uncomplicated: Secondary | ICD-10-CM | POA: Diagnosis not present

## 2018-07-23 DIAGNOSIS — R630 Anorexia: Secondary | ICD-10-CM | POA: Diagnosis not present

## 2018-07-23 DIAGNOSIS — K449 Diaphragmatic hernia without obstruction or gangrene: Secondary | ICD-10-CM | POA: Insufficient documentation

## 2018-07-23 DIAGNOSIS — I700 Atherosclerosis of aorta: Secondary | ICD-10-CM | POA: Insufficient documentation

## 2018-07-23 DIAGNOSIS — D500 Iron deficiency anemia secondary to blood loss (chronic): Secondary | ICD-10-CM | POA: Insufficient documentation

## 2018-07-23 DIAGNOSIS — G4700 Insomnia, unspecified: Secondary | ICD-10-CM | POA: Diagnosis not present

## 2018-07-23 LAB — CMP (CANCER CENTER ONLY)
ALK PHOS: 94 U/L (ref 38–126)
ALT: 25 U/L (ref 0–44)
ANION GAP: 7 (ref 5–15)
AST: 26 U/L (ref 15–41)
Albumin: 3.4 g/dL — ABNORMAL LOW (ref 3.5–5.0)
BILIRUBIN TOTAL: 0.2 mg/dL — AB (ref 0.3–1.2)
BUN: 17 mg/dL (ref 8–23)
CALCIUM: 8.9 mg/dL (ref 8.9–10.3)
CO2: 30 mmol/L (ref 22–32)
CREATININE: 0.98 mg/dL (ref 0.61–1.24)
Chloride: 105 mmol/L (ref 98–111)
Glucose, Bld: 113 mg/dL — ABNORMAL HIGH (ref 70–99)
Potassium: 4.3 mmol/L (ref 3.5–5.1)
Sodium: 142 mmol/L (ref 135–145)
TOTAL PROTEIN: 6.1 g/dL — AB (ref 6.5–8.1)

## 2018-07-23 LAB — CBC WITH DIFFERENTIAL (CANCER CENTER ONLY)
BASOS ABS: 0 10*3/uL (ref 0.0–0.1)
BASOS PCT: 1 %
EOS ABS: 0.3 10*3/uL (ref 0.0–0.5)
Eosinophils Relative: 8 %
HEMATOCRIT: 38.2 % — AB (ref 38.4–49.9)
Hemoglobin: 12.3 g/dL — ABNORMAL LOW (ref 13.0–17.1)
Lymphocytes Relative: 32 %
Lymphs Abs: 1.3 10*3/uL (ref 0.9–3.3)
MCH: 27.2 pg (ref 27.2–33.4)
MCHC: 32.1 g/dL (ref 32.0–36.0)
MCV: 84.7 fL (ref 79.3–98.0)
MONOS PCT: 13 %
Monocytes Absolute: 0.5 10*3/uL (ref 0.1–0.9)
NEUTROS ABS: 1.9 10*3/uL (ref 1.5–6.5)
NEUTROS PCT: 46 %
Platelet Count: 123 10*3/uL — ABNORMAL LOW (ref 140–400)
RBC: 4.52 MIL/uL (ref 4.20–5.82)
RDW: 20.6 % — ABNORMAL HIGH (ref 11.0–14.6)
WBC Count: 4.1 10*3/uL (ref 4.0–10.3)

## 2018-07-23 LAB — IRON AND TIBC
IRON: 46 ug/dL (ref 42–163)
Saturation Ratios: 18 % — ABNORMAL LOW (ref 42–163)
TIBC: 261 ug/dL (ref 202–409)
UIBC: 215 ug/dL

## 2018-07-23 LAB — FERRITIN: FERRITIN: 38 ng/mL (ref 24–336)

## 2018-07-23 NOTE — Telephone Encounter (Signed)
Appts scheduled AVS/Calendar provided per 8/22 los

## 2018-07-24 DIAGNOSIS — S7001XA Contusion of right hip, initial encounter: Secondary | ICD-10-CM | POA: Insufficient documentation

## 2018-07-30 ENCOUNTER — Other Ambulatory Visit: Payer: Self-pay | Admitting: Hematology

## 2018-07-30 DIAGNOSIS — C49A2 Gastrointestinal stromal tumor of stomach: Secondary | ICD-10-CM

## 2018-08-04 ENCOUNTER — Telehealth: Payer: Self-pay

## 2018-08-04 NOTE — Telephone Encounter (Signed)
Left voice message for patient that iron levels are borderline low, start oral iron supplement (ferrous sulfate) 1 tab daily, if already taking increase to 1 tab twice daily.

## 2018-08-04 NOTE — Telephone Encounter (Signed)
-----   Message from Alla Feeling, NP sent at 07/31/2018  4:57 PM EDT ----- Please let him know iron levels are borderline low, and to start po ferrous sulfate 1 tab daily. If already on oral iron, please increase to BID. Will check routinely.  Thanks, Regan Rakers NP

## 2018-08-06 MED FILL — IMATINIB MESYLATE 100 MG TA: 100 | 30 days supply | Qty: 120 | Fill #0

## 2018-08-25 ENCOUNTER — Other Ambulatory Visit: Payer: Self-pay | Admitting: Nurse Practitioner

## 2018-08-25 DIAGNOSIS — C49A2 Gastrointestinal stromal tumor of stomach: Secondary | ICD-10-CM

## 2018-09-07 MED FILL — IMATINIB MESYLATE 100 MG TA: 100 | 30 days supply | Qty: 120 | Fill #0

## 2018-09-22 NOTE — Progress Notes (Signed)
[place]  [phone] [phone]  Clinic Follow Up Note   Patient Care Team: Velna Hatchet, MD as PCP - General (Internal Medicine) [name], MD as PCP - Cardiology (Cardiology) 09/24/2018  CHIEF COMPLAINTS:  Follow Up gastric GIST    SUMMARY OF ONCOLOGY HISTORY  Oncology History   Cancer Staging Malignant gastrointestinal stromal tumor (GIST) of stomach ([place]) Staging form: Gastrointestinal Stromal Tumor - Gastric and Omental GIST, AJCC 8th Edition - Pathologic stage from 04/22/2018: Stage IIIB (pT4, pN0, cM0, Mitotic Rate: High) - Signed by Alla Feeling, NP on 04/22/2018       Malignant gastrointestinal stromal tumor (GIST) of stomach ([place])   02/16/2018 Procedure    Upper EGD per Dr. [name] Impression:  - Normal upper third of esophagus and middle third of esophagus. - 5 cm hiatal hernia. - Widely patent Schatzki ring. - Clotted blood in the gastric body. - Rule out malignancy, gastric tumor on the lesser curvature of the stomach. Injected. Clips were placed. - Normal examined duodenum. - No specimens collected.    02/17/2018 Imaging    CT AP IMPRESSION: 1. 9.7 x 8.8 x 10.9 cm aggressive appearing heterogeneously enhancing mass along the lesser curvature of the stomach, highly concerning for primary gastric neoplasm. This is intimately associated with the undersurface of the left lobe of the liver without definitive evidence of direct invasion (although superficial involvement of the hepatic capsule is suspected given the loss of the intervening fat plane). There is also an indeterminate left adrenal nodule measuring 2.0 x 1.4 cm, which could be metastatic. No other definite signs of metastatic disease elsewhere in the abdomen or pelvis. 2. Areas of cylindrical bronchiectasis and chronic scarring in the lower lobes of the lungs bilaterally. 3. Aortic atherosclerosis, in addition to left main and 3 vessel coronary artery  disease. 4. Right kidney is either surgically or congenitally absent, or severely atrophic. 5. Additional incidental findings, as above.    02/19/2018 Initial Biopsy    VIA CT BIOPSY: Diagnosis -  Soft Tissue Needle Core Biopsy, gastric - FINDINGS CONSISTENT WITH GASTROINTESTINAL STROMAL TUMOR (GIST). - SEE COMMENT.    02/19/2018 Pathology Results    Diagnosis 02/19/2018 Soft Tissue Needle Core Biopsy, gastric - FINDINGS CONSISTENT WITH GASTROINTESTINAL STROMAL TUMOR (GIST).    03/12/2018 Initial Diagnosis    Malignant gastrointestinal stromal tumor (GIST) of stomach ([place])    03/12/2018 Pathology Results    Surgical path: Diagnosis Stomach, resection for tumor, Lesser curvature - GASTROINTESTINAL STROMAL TUMOR, 13.8 CM.    03/18/2018 Imaging    CT CAP IMPRESSION: -obtained d/t post op fever, leukocytosis, and respiratory change   Moderate right pleural effusion and small left pleural effusion. Compressive atelectasis versus pneumonia in the lower lobes. Nodular airspace disease throughout much of the right upper lobe and right middle lobe concerning for pneumonia.  Cardiomegaly. Severe/diffuse coronary artery disease. Aortic atherosclerosis. No evidence of aortic aneurysm.  Postoperative changes from recent partial gastrectomy. Small amount of free air in the abdomen, likely related to post op state. No evidence of bowel obstruction.  Prior remote right nephrectomy and prostatectomy.    03/22/2018 Procedure    Thoracentesis  Diagnosis PLEURAL FLUID, RIGHT (SPECIMEN 1 OF 1, COLLECTED ON 03/22/2018): NO MALIGNANT CELLS IDENTIFIED.      04/22/2018 Cancer Staging    Staging form: Gastrointestinal Stromal Tumor - Gastric and Omental GIST, AJCC 8th Edition - Pathologic stage from 04/22/2018: Stage IIIB (pT4, pN0, cM0, Mitotic Rate: High) - Signed  by Alla Feeling, NP on 04/22/2018    05/03/2018 Imaging    05/03/2018 CT Abdomen and Pelvis W/ Contrast IMPRESSION: CTA chest:  1.  No pulmonary embolus. 2. Debris within the right lower lobe bronchus with associated right lower lobe opacity, this can be seen with aspiration. 3. Improved right upper lobe aeration from prior consistent with resolving pneumonia. Near completely resolved right pleural effusion. 4. Chronic bilateral lower lobe bronchiectasis. Bilateral lower lobe airspace disease with some improvement from most recent prior. 5. Coronary artery calcifications. Aortic Atherosclerosis (ICD10-I70.0).  CT abdomen/pelvis:  1. Large colonic stool burden, suggesting constipation. No bowel obstruction. 2. Post recent partial gastrectomy without evident complication. No intra-abdominal abscess. 3. Additional stable chronic findings as described. Aortic Atherosclerosis (ICD10-I70.0).    05/03/2018 Imaging    05/03/2018 CT Angio Chest PE W or WO Contrast IMPRESSION: CTA chest:  1. No pulmonary embolus. 2. Debris within the right lower lobe bronchus with associated right lower lobe opacity, this can be seen with aspiration. 3. Improved right upper lobe aeration from prior consistent with resolving pneumonia. Near completely resolved right pleural effusion. 4. Chronic bilateral lower lobe bronchiectasis. Bilateral lower lobe airspace disease with some improvement from most recent prior. 5. Coronary artery calcifications. Aortic Atherosclerosis (ICD10-I70.0).    05/03/2018 Imaging    05/03/2018 CT CAP IMPRESSION: CTA chest: 1. No pulmonary embolus. 2. Debris within the right lower lobe bronchus with associated right lower lobe opacity, this can be seen with aspiration. 3. Improved right upper lobe aeration from prior consistent with resolving pneumonia. Near completely resolved right pleural effusion. 4. Chronic bilateral lower lobe bronchiectasis. Bilateral lower lobe airspace disease with some improvement from most recent prior. 5. Coronary artery calcifications. Aortic  Atherosclerosis (ICD10-I70.0).  CT abdomen/pelvis: 1. Large colonic stool burden, suggesting constipation. No bowel obstruction. 2. Post recent partial gastrectomy without evident complication. No intra-abdominal abscess. 3. Additional stable chronic findings as described. Aortic Atherosclerosis (ICD10-I70.0).     HISTORY OF PRESENTING ILLNESS:  Momodou Consiglio 84 y.o. male is here because of recent diagnosis of GIST. He presents with his son; was referred by Dr. Barry Dienes. He initially became symptomatic in January 2019 with decreased appetite, abdominal fullness, and intermittent atypical chest pain; he went to ED for evaluation. CTA ruled out PE and he was discharged home with outpatient cardiology f/u.  He then presented to [facility] ED on 02/16/18 for rectal bleeding and hematemesis on aspirin therapy. He was hypotensive with acute anemia in the emergency department, Hgb 7.7 (previously 14.9 in 12/2017). He was stabilized with IVF and 2 units RBCs and admitted for GI bleed work up.  He underwent upper endoscopy per Dr. [name] on 02/16/2018 which showed a large, submucosal partially circumferential mass with no bleeding but stigmata of recent bleeding on the lesser curvature of the stomach.  There was a small area of ulceration with pigmentation noted as well as a large amount of dark red, coffee-ground colored and clotted blood found in the gastric body.  The mass was biopsied, findings consistent with gastrointestinal stromal tumor.  He then underwent staging CT of the abdomen and pelvis on 02/17/2018 which noted the 9.7 x 8.8 x 10.9 enhancing mass along the area of concern which was intimately associated with the undersurface of the left lobe of the liver without definitive evidence of direct invasion.  There was an indeterminate left adrenal nodule measuring 2.0 x 1.4 cm which could be metastatic.  No other evidence of metastatic disease in the abdomen  or pelvis.  Hemoglobin improved and he was  discharged to [place] facility with outpatient surgery follow-up scheduled with Dr. Barry Dienes.  Given his high risk for re-bleeding, he was taken to the operating room by Dr. Barry Dienes on 03/12/18 who performed a laparoscopic assisted converted to open partial gastrectomy. Surgical pathology confirmed gastrointestinal stromal tumor measuring 13.8 cm with associated mucosal ulceration extending into the underlying mass; predominately spindle features, mitotic rate greater than 20/50 high power field, grade 2. Final staging revealed pT4pNX. Post op course was complicated by pneumonia requiring thoracentesis. Cytology was negative for malignant cells. No abdominal complications were noted on postop imaging.  He was discharged to SNF on 03/24/2018 where he currently resides.   He has a personal history of prostate cancer more than 10 years ago s/p TURP, OSA on CPAP, restless leg syndrome, and chronic back pain on oxymorphone. Is is s/p nephrectomy for "kidney cysts." He previously lived in independent housing at [facility] for [facility] years prior to his diagnosis; he ambulated independently with a walker and had assistance with meal prep.  He has remote alcohol history, quit drinking 35 years ago.  He is a never smoker.  He is widowed.  Has 2 children, one living and healthy, one deceased from blood clot.  Family history of "bone" cancer in his mother, and prostate and a great-great grandfather.   Today he reports a slow recovery, energy level is low but improving.  He receives PT therapy daily except on weekends.  His legs are weak, presents in a wheelchair today.  Appetite is low but improving, not back to baseline.  Was previously on Remeron for appetite stimulant but not currently taking.  He has chronic constipation uses MiraLAX and prune juice as needed.  No recent blood in stool.  Denies abdominal pain, nausea or vomiting.  No recent fever, cough, dyspnea, chest pain, or wheezing.  CURRENT  THERAPY: Adjuvant Gleevec, starting at '200mg'$  daily started on 05/26/2018 and gradually increased to '400mg'$  daily on 06/23/2018  INTERVAL HISTORY: Eliav Mechling is a 84 y.o. male who is here today for follow-up. He was informed last month that his iron levels are borderline low, and was advised to take oral iron pills.  Today, he is here with his family member. He is doing well, but is feeling tired today. He's been having diarrhea since last week. He had 2 BM today, and takes imodium as needed. He is usually constipated.  He denies abdominal pain, bloating or nausea.    MEDICAL HISTORY:  Past Medical History:  Diagnosis Date  . Anemia   . Asthma    AS A CHILD  . Blind left eye   . Cancer [facility])    PROSTATE CANCER  . Cellulitis    frequently, "all over"  . Chronic kidney disease   . Chronic pain syndrome   . Constipation   . DDD (degenerative disc disease), lumbar   . Gastrointestinal stromal tumor (GIST) of stomach ([place])   . GI bleeding   . History of blood transfusion    01/2018  . Insomnia   . Kyphoscoliosis   . Mass in the abdomen 01/2018  . Restless leg   . Sleep apnea    does not wear CPAP  . Wears glasses     SURGICAL HISTORY: Past Surgical History:  Procedure Laterality Date  . CHOLECYSTECTOMY    . COLONOSCOPY    . ESOPHAGOGASTRODUODENOSCOPY (EGD) WITH PROPOFOL Left 02/16/2018   Procedure: ESOPHAGOGASTRODUODENOSCOPY (EGD) WITH  PROPOFOL;  Surgeon: [name], MD;  Location: [place];  Service: Gastroenterology;  Laterality: Left;  . LAPAROSCOPIC GASTRECTOMY N/A 03/12/2018   Procedure: LAPAROSCOPIC ASSISTED PARTIAL GASTRECTOMY ERAS PATHWAY;  Surgeon: Stark Klein, MD;  Location: [place];  Service: General;  Laterality: N/A;  . LAPAROSCOPIC PARTIAL GASTRECTOMY  03/12/2018   LAPAROSCOPIC ASSISTED PARTIAL GASTRECTOMY ERAS PATHWAY  . PROSTATECTOMY    . QUADRICEPS TENDON REPAIR Left 07/07/2017   Procedure: REPAIR QUADRICEP TENDON;  Surgeon: Rod Can, MD;  Location: [place];  Service: Orthopedics;  Laterality: Left;  . RIGHT CATARACT EXTRACTION  2013  . RIGTH NEPHRECTOMY  2006  . TONSILLECTOMY    . TRANSURETHRAL RESECTION OF PROSTATE  1997    SOCIAL HISTORY: Social History   Socioeconomic History  . Marital status: Widowed    Spouse name: Not on file  . Number of children: 2  . Years of education: Not on file  . Highest education level: Not on file  Occupational History  . Not on file  Social Needs  . Financial resource strain: Not on file  . Food insecurity:    Worry: Not on file    Inability: Not on file  . Transportation needs:    Medical: Not on file    Non-medical: Not on file  Tobacco Use  . Smoking status: Never Smoker  . Smokeless tobacco: Never Used  Substance and Sexual Activity  . Alcohol use: No    Alcohol/week: 0.0 standard drinks    Comment: quit 1985, 2 fifths/ week,S/P rehab  . Drug use: No  . Sexual activity: Never  Lifestyle  . Physical activity:    Days per week: Not on file    Minutes per session: Not on file  . Stress: Not on file  Relationships  . Social connections:    Talks on phone: Not on file    Gets together: Not on file    Attends religious service: Not on file    Active member of club or organization: Not on file    Attends meetings of clubs or organizations: Not on file    Relationship status: Not on file  . Intimate partner violence:    Fear of current or ex partner: Not on file    Emotionally abused: Not on file    Physically abused: Not on file    Forced sexual activity: Not on file  Other Topics Concern  . Not on file  Social History Narrative   Caffeine 2-3 cups average.  Widowed,  Lives at [facility], [place]. Living.  Retired Forensic psychologist.  One son.    FAMILY HISTORY: Family History  Problem Relation Age of Onset  . Bone cancer Mother   . Hypertension Mother   . Arthritis Mother   . Clotting disorder Daughter        died of blood clot     ALLERGIES:  is allergic to coconut  flavor [flavoring agent]; coconut oil; and food.  MEDICATIONS:  Current Outpatient Medications  Medication Sig Dispense Refill  . ferrous sulfate 325 (65 FE) MG tablet Take 325 mg by mouth daily with breakfast.    . feeding supplement (BOOST HIGH PROTEIN) LIQD Take 1 Container by mouth daily.    Marland Kitchen gabapentin (NEURONTIN) 300 MG capsule Take 1 capsule (300 mg total) by mouth 3 (three) times daily. (Patient taking differently: Take 300 mg by mouth 2 (two) times daily. )    . imatinib (GLEEVEC) 100 MG tablet TAKE 4 TABLETS ('400MG'$ ) BY MOUTH  ONCE DAILY ON A FULL STOMACH AND A GLASS OF WATER. 120 tablet 0  . mirtazapine (REMERON) 7.5 MG tablet TAKE 1 TABLET(7.5 MG) BY MOUTH AT BEDTIME 30 tablet 0  . oxymorphone (OPANA) 10 MG tablet Take 10 mg by mouth every 6 (six) hours as needed for pain.     . polyvinyl alcohol (LIQUIFILM TEARS) 1.4 % ophthalmic solution Place 1-2 drops into both eyes 3 (three) times daily as needed (FOR DRY EYES.). 15 mL 0  . protein supplement shake (PREMIER PROTEIN) LIQD Take 325 mLs (11 oz total) by mouth 2 (two) times daily between meals.  0  . senna-docusate (SENOKOT-S) 8.6-50 MG tablet Take 1 tablet by mouth at bedtime as needed for mild constipation.     No current facility-administered medications for this visit.     REVIEW OF SYSTEMS:  Constitutional: Denies fevers, chills or abnormal night sweats (+) fatigue Respiratory: Denies cough, dyspnea or wheezes Cardiovascular: Denies palpitation, chest discomfort or lower extremity swelling Gastrointestinal:  Denies nausea, vomiting, diarrhea, abdominal pain, hematochezia, heartburn or change in bowel habits (+) chronic constipation, recently changed to diarrhea Skin: Denies abnormal skin rashes. (+) left arm bruise due to trauma  Lymphatics: Denies new lymphadenopathy or easy bruising Neurological:Denies numbness, tingling  Behavioral/Psych: Mood is stable, no new changes  MSK: (+) chronic back pain (+) restless leg  syndrome   All other systems were reviewed with the patient and are negative.  PHYSICAL EXAMINATION: ECOG PERFORMANCE STATUS: 3   Vitals:   09/24/18 1025  BP: 130/67  Pulse: 76  Resp: 17  Temp: 98.1 F (36.7 C)  SpO2: 97%   Filed Weights   09/24/18 1025  Weight: 150 lb 4.8 oz (68.2 kg)    GENERAL:alert, no distress, (+) fatigued  SKIN: no rashes or significant lesions  EYES: normal, conjunctiva are pink and non-injected, sclera clear OROPHARYNX:no thrush or ulcers    LYMPH:  no palpable cervical, supraclavicular, or axillary lymphadenopathy  LUNGS: clear to auscultation with normal breathing effort HEART: regular rate & rhythm and no murmurs and no lower extremity edema ABDOMEN:abdomen soft, non-tender and normal bowel sounds. (+) midline abdominal scar is healing well, mild erythema, no drainage.  Musculoskeletal:no cyanosis of digits and no clubbing (+) right hip pain (+) back pain PSYCH: alert & oriented x 3 with fluent speech NEURO: generalized weakness  LABORATORY DATA:  I have reviewed the data as listed CBC Latest Ref Rng & Units 09/24/2018 07/23/2018 06/22/2018  WBC 4.0 - 10.5 K/uL 4.7 4.1 6.2  Hemoglobin 13.0 - 17.0 g/dL 12.2(L) 12.3(L) 12.0(L)  Hematocrit 39.0 - 52.0 % 39.4 38.2(L) 38.4  Platelets 150 - 400 K/uL 133(L) 123(L) 139(L)   CMP Latest Ref Rng & Units 09/24/2018 07/23/2018 06/22/2018  Glucose 70 - 99 mg/dL 82 113(H) 100(H)  BUN 8 - 23 mg/dL '15 17 18  '$ Creatinine 0.61 - 1.24 mg/dL 0.88 0.98 1.01  Sodium 135 - 145 mmol/L 141 142 138  Potassium 3.5 - 5.1 mmol/L 4.6 4.3 4.6  Chloride 98 - 111 mmol/L 105 105 104  CO2 22 - 32 mmol/L '28 30 30  '$ Calcium 8.9 - 10.3 mg/dL 8.7(L) 8.9 8.7(L)  Total Protein 6.5 - 8.1 g/dL 6.0(L) 6.1(L) 6.0(L)  Total Bilirubin 0.3 - 1.2 mg/dL 0.4 0.2(L) 0.2(L)  Alkaline Phos 38 - 126 U/L 88 94 91  AST 15 - 41 U/L '25 26 26  '$ ALT 0 - 44 U/L '16 25 23     '$ PATHOLOGY  Diagnosis 03/12/2018 Stomach, resection  for tumor, Lesser  curvature - GASTROINTESTINAL STROMAL TUMOR, 13.8 CM. - SEE ONCOLOGY TABLE. Microscopic Comment GASTROINTESTINAL STROMAL TUMOR (GIST): Procedure: Resection of lesser gastric curvature mass. Tumor Site: Stomach, lesser curvature. Specify: Lesser curvature. Tumor Size: 13.8 cm. Tumor Focality: Unifocal GIST Subtype (spindle, epithelioid, mixed, etc.): Predominately spindled. Mitotic Rate: Greater than 20/50 HIGH POWER FIELD Necrosis: Present, focal. Histologic Grade: G2. G1: Low grade; mitotic rate 5/50 HIGH POWER FIELD. G2: High grade; mitotic rate >5/50 HIGH POWER FIELD. Risk Assessment: High risk. Margins: Free of tumor. Distance of tumor from closest margin: Less than 0.2 cm. Ancillary testing: Immunohistochemistry, see comment. Lymph nodes: number examined: 0; number positive: N/A Pathologic Staging: pT4 pNX. Comments: The tumor is 13.8 cm in greatest dimension and is associated with mucosal ulceration which extends into the underlying mass. The tumor has a predominate spindle cell pattern and the mitotic rate is greater than 20/50 high power fields. There is hypercellularity and variable nuclear atypia. Immunohistochemistry is performed and the tumor is positive with CD117 (C-Kit) and CD34 and negative with cytokeratin AE1/AE3. Ki-67 immunohistochemistry shows a Ki-67 index of 20%. (JDP:gt, 03/16/18)ss and Clin Specimen(s) Obtained: Stomach, resection for tumor, Lesser curvature Specimen Clinical Information GIST stomach gastrointestinal stromal tumor (tl) Gross Received fresh is a 13.8 x 9.2 x 6.7 cm firm nodular mass with an attached 7.0 x 6.5 cm portion of stomach, clinically lesser curvature. The gastric mucosa shows a central 1.2 x 0.5 cm ulcer. The ulcer extends into the nodular mass. The cut surface of the mass is tan-white with a 7.0 cm cavitary defect containing bloody fluid and clotted blood. There are focal areas of calcification. There is a line of staples present along one  edge of the portion of stomach. Sections are submitted in ten cassettes. A-B = sections parallel to stapled gastric mucosa C = section of mass to include overlying ulcerated gastric mucosa D-J = sections of nodular mass. (GP:ah 03/13/18) Stain(s) used in Diagnosis: The following stain(s) were used in diagnosing the case: CD117 (C-KIT), KI-67-NO ACIS, CD 34, CK AE1AE3. The control(s) stained appropriately.  Diagnosis 02/19/2018 Soft Tissue Needle Core Biopsy, gastric - FINDINGS CONSISTENT WITH GASTROINTESTINAL STROMAL TUMOR (GIST). Microscopic Comment Dr. Vicente Males has reviewed the case and concurs with this interpretation. Specimen(s) Obtained: Soft Tissue Needle Core Biopsy, gastric Specimen Clinical Information gastric mass (cm) Gross The specimen is received in formalin and consists of four cores of tan-white to red soft tissue, ranging from 0.6 to 2.7 cm in length x 0.1 cm in diameter. The specimen is entirely submitted in two cassettes.  PROCEDURES:   02/16/2018 Upper Endoscopy per Dr. [name] Impression: - Normal upper third of esophagus and middle third of esophagus. - 5 cm hiatal hernia. - Widely patent Schatzki ring. - Clotted blood in the gastric body. - Rule out malignancy, gastric tumor on the lesser curvature of the stomach. Injected. Clips were placed. - Normal examined duodenum. - No specimens collected.   RADIOGRAPHIC STUDIES: I have personally reviewed the radiological images as listed and agreed with the findings in the report.  05/03/2018 CT CAP IMPRESSION: CTA chest: 1. No pulmonary embolus. 2. Debris within the right lower lobe bronchus with associated right lower lobe opacity, this can be seen with aspiration. 3. Improved right upper lobe aeration from prior consistent with resolving pneumonia. Near completely resolved right pleural effusion. 4. Chronic bilateral lower lobe bronchiectasis. Bilateral lower lobe airspace disease with some improvement  from most recent prior. 5. Coronary artery calcifications. Aortic Atherosclerosis (  ICD10-I70.0).  CT abdomen/pelvis: 1. Large colonic stool burden, suggesting constipation. No bowel obstruction. 2. Post recent partial gastrectomy without evident complication. No intra-abdominal abscess. 3. Additional stable chronic findings as described. Aortic Atherosclerosis (ICD10-I70.0).  ASSESSMENT & PLAN:  Anthonymichael Munday is a pleasant 84 y.o. caucasian male with PMH of prostate cancer presented with decreased appetite, abdominal fullness, GI bleeding, and anemia found to have large malignant mass in the stomach s/p partial gastrectomy  1. Gastrointestinal Stromal Tumor (GIST) of the lesser curvature of the stomach, 13.8 cm, pT4pNX, G2, mitotic rate >20/50 high power field, stage IIIB -We reviewed his medical records including labs, imaging, and pathology in detail with the patient and his son. He underwent complete resection per Dr. Barry Dienes on 03/12/18. Pathology revealed a large 13.8 cm GIST in the stomach. IHC was performed and the tumor is positive with CD117 (C-Kit). 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 His risk of recurrence is around 86%.  -Due to her advanced age, comorbidities, and limited performance status, if he does have significant side effects from [place], I have low threshold to stop it. -Plan for adjuvant Gleevec for 3 years, if he can tolerate. -He is tolerating full dose Gleevec very well, with occasional diarrhea, no other noticeable side effects.  He overall is doing well. -Will repeat scan in December. -f/u in 2 months  2. Anemia, secondary to blood loss -He initially presented with GI bleed and symptomatic anemia, Hgb 7.7 on 02/16/18; he was transfused total 3 units with improvement to 9.2 -Hgb began to trend down again at the time of his surgery  in 03/2018; Hgb 6.6 one week post-op. Hgb up to 8.3 at discharge.  -He was prescribed oral iron therapy 1 tab daily beginning in 04/2018; iron studies not done initially  -Rechecked CBC today, Hgb is 12.2, iron study still pending -Continue oral ferrous sulfate daily  3. Chronic constipation -He uses miralax and prune juice PRN -He is on chronic opioid use with oxymorphone for chronic back pain, this is contributing to constipation.  -Denies further rectal bleeding.  -I encouraged him to use laxative and stool softener routinely to maintain regular BM, increase water intake and mobility.  -He uses Senna pills 3 times daily. I adviced to increase the dose to 6 tabs/day, and his primary care physician also gave him some sample Linzess, I encouraged him to try   4. Deconditioning, fatigue and low appetite  -[facility] where he participates in daily PT; he requires assistance with all activities then. He is now back to independent housing, and that his son is considering moving him to assisted living, due to his recent fall. -I previously called in mirtazapine 7.5 mg daily for his low appetite, I recommend him to increase to 15 mg daily -overall improved lately, continue mirtazapine    PLAN: -Continue Gleevec 400 mg daily, he is tolerating well.   -f/u in 2 months -repeat CT CAP with contrast before next visit    All questions were answered. The patient knows to call the clinic with any problems, questions or concerns. I spent 20 minutes counseling the patient face to face. The total time spent in the appointment was 25 minutes and more than 50% was on counseling, review of test results, and coordination of care.   Dierdre Searles Dweik am acting as scribe for Dr. Truitt Merle.  I have reviewed the above documentation for accuracy and completeness, and I agree with the above.   Truitt Merle, MD 09/24/2018

## 2018-09-24 ENCOUNTER — Inpatient Hospital Stay (HOSPITAL_BASED_OUTPATIENT_CLINIC_OR_DEPARTMENT_OTHER): Payer: Medicare Other | Admitting: Hematology

## 2018-09-24 ENCOUNTER — Telehealth: Payer: Self-pay | Admitting: Hematology

## 2018-09-24 ENCOUNTER — Inpatient Hospital Stay: Payer: Medicare Other | Attending: Oncology

## 2018-09-24 ENCOUNTER — Encounter: Payer: Self-pay | Admitting: Hematology

## 2018-09-24 VITALS — BP 130/67 | HR 76 | Temp 98.1°F | Resp 17 | Ht 69.0 in | Wt 150.3 lb

## 2018-09-24 DIAGNOSIS — Z79899 Other long term (current) drug therapy: Secondary | ICD-10-CM | POA: Diagnosis not present

## 2018-09-24 DIAGNOSIS — C49A2 Gastrointestinal stromal tumor of stomach: Secondary | ICD-10-CM | POA: Diagnosis present

## 2018-09-24 DIAGNOSIS — K449 Diaphragmatic hernia without obstruction or gangrene: Secondary | ICD-10-CM | POA: Insufficient documentation

## 2018-09-24 DIAGNOSIS — G4700 Insomnia, unspecified: Secondary | ICD-10-CM | POA: Diagnosis not present

## 2018-09-24 DIAGNOSIS — M5136 Other intervertebral disc degeneration, lumbar region: Secondary | ICD-10-CM

## 2018-09-24 DIAGNOSIS — K5909 Other constipation: Secondary | ICD-10-CM

## 2018-09-24 DIAGNOSIS — J45909 Unspecified asthma, uncomplicated: Secondary | ICD-10-CM | POA: Diagnosis not present

## 2018-09-24 DIAGNOSIS — I700 Atherosclerosis of aorta: Secondary | ICD-10-CM | POA: Diagnosis not present

## 2018-09-24 DIAGNOSIS — I2510 Atherosclerotic heart disease of native coronary artery without angina pectoris: Secondary | ICD-10-CM | POA: Diagnosis not present

## 2018-09-24 DIAGNOSIS — D500 Iron deficiency anemia secondary to blood loss (chronic): Secondary | ICD-10-CM | POA: Diagnosis not present

## 2018-09-24 DIAGNOSIS — Z8546 Personal history of malignant neoplasm of prostate: Secondary | ICD-10-CM | POA: Insufficient documentation

## 2018-09-24 DIAGNOSIS — J90 Pleural effusion, not elsewhere classified: Secondary | ICD-10-CM | POA: Insufficient documentation

## 2018-09-24 DIAGNOSIS — J470 Bronchiectasis with acute lower respiratory infection: Secondary | ICD-10-CM

## 2018-09-24 LAB — IRON AND TIBC
IRON: 68 ug/dL (ref 42–163)
Saturation Ratios: 28 % — ABNORMAL LOW (ref 42–163)
TIBC: 238 ug/dL (ref 202–409)
UIBC: 170 ug/dL

## 2018-09-24 LAB — FERRITIN: FERRITIN: 52 ng/mL (ref 24–336)

## 2018-09-24 LAB — CBC WITH DIFFERENTIAL (CANCER CENTER ONLY)
ABS IMMATURE GRANULOCYTES: 0.05 10*3/uL (ref 0.00–0.07)
Basophils Absolute: 0 10*3/uL (ref 0.0–0.1)
Basophils Relative: 0 %
Eosinophils Absolute: 0.2 10*3/uL (ref 0.0–0.5)
Eosinophils Relative: 4 %
HCT: 39.4 % (ref 39.0–52.0)
HEMOGLOBIN: 12.2 g/dL — AB (ref 13.0–17.0)
Immature Granulocytes: 1 %
LYMPHS PCT: 22 %
Lymphs Abs: 1 10*3/uL (ref 0.7–4.0)
MCH: 31 pg (ref 26.0–34.0)
MCHC: 31 g/dL (ref 30.0–36.0)
MCV: 100 fL (ref 80.0–100.0)
Monocytes Absolute: 0.6 10*3/uL (ref 0.1–1.0)
Monocytes Relative: 14 %
NEUTROS ABS: 2.7 10*3/uL (ref 1.7–7.7)
NRBC: 0 % (ref 0.0–0.2)
Neutrophils Relative %: 59 %
PLATELETS: 133 10*3/uL — AB (ref 150–400)
RBC: 3.94 MIL/uL — AB (ref 4.22–5.81)
RDW: 15.3 % (ref 11.5–15.5)
WBC: 4.7 10*3/uL (ref 4.0–10.5)

## 2018-09-24 LAB — CMP (CANCER CENTER ONLY)
ALT: 16 U/L (ref 0–44)
AST: 25 U/L (ref 15–41)
Albumin: 3.4 g/dL — ABNORMAL LOW (ref 3.5–5.0)
Alkaline Phosphatase: 88 U/L (ref 38–126)
Anion gap: 8 (ref 5–15)
BUN: 15 mg/dL (ref 8–23)
CHLORIDE: 105 mmol/L (ref 98–111)
CO2: 28 mmol/L (ref 22–32)
Calcium: 8.7 mg/dL — ABNORMAL LOW (ref 8.9–10.3)
Creatinine: 0.88 mg/dL (ref 0.61–1.24)
Glucose, Bld: 82 mg/dL (ref 70–99)
POTASSIUM: 4.6 mmol/L (ref 3.5–5.1)
Sodium: 141 mmol/L (ref 135–145)
Total Bilirubin: 0.4 mg/dL (ref 0.3–1.2)
Total Protein: 6 g/dL — ABNORMAL LOW (ref 6.5–8.1)

## 2018-09-24 NOTE — Telephone Encounter (Signed)
Scheduled appt per 10/24 los - gave patient AVS and calender per los . 

## 2018-09-29 ENCOUNTER — Other Ambulatory Visit: Payer: Self-pay | Admitting: Hematology

## 2018-09-29 DIAGNOSIS — C49A2 Gastrointestinal stromal tumor of stomach: Secondary | ICD-10-CM

## 2018-10-02 MED FILL — IMATINIB MESYLATE 100 MG TA: 100 | 30 days supply | Qty: 120 | Fill #0

## 2018-10-05 ENCOUNTER — Telehealth: Payer: Self-pay

## 2018-10-05 NOTE — Telephone Encounter (Signed)
Left voice message for patient that his iron studies were good per Cira Rue NP, continue oral iron once daily.

## 2018-10-05 NOTE — Telephone Encounter (Signed)
-----   Message from Alla Feeling, NP sent at 10/02/2018  5:39 PM EDT ----- Please let him know iron studies. Continue oral iron once daily.  Thanks, Regan Rakers

## 2018-10-11 ENCOUNTER — Other Ambulatory Visit: Payer: Self-pay

## 2018-10-11 ENCOUNTER — Emergency Department (HOSPITAL_COMMUNITY)
Admission: EM | Admit: 2018-10-11 | Discharge: 2018-10-11 | Disposition: A | Discharge status: Home / Self Care | Payer: Medicare Other | Attending: Emergency Medicine | Admitting: Emergency Medicine

## 2018-10-11 ENCOUNTER — Encounter (HOSPITAL_COMMUNITY): Payer: Self-pay

## 2018-10-11 ENCOUNTER — Emergency Department (HOSPITAL_COMMUNITY): Payer: Medicare Other

## 2018-10-11 DIAGNOSIS — N189 Chronic kidney disease, unspecified: Secondary | ICD-10-CM | POA: Diagnosis not present

## 2018-10-11 DIAGNOSIS — Z79899 Other long term (current) drug therapy: Secondary | ICD-10-CM | POA: Diagnosis not present

## 2018-10-11 DIAGNOSIS — L98422 Non-pressure chronic ulcer of back with fat layer exposed: Secondary | ICD-10-CM

## 2018-10-11 DIAGNOSIS — M79671 Pain in right foot: Secondary | ICD-10-CM | POA: Diagnosis present

## 2018-10-11 DIAGNOSIS — M549 Dorsalgia, unspecified: Secondary | ICD-10-CM | POA: Diagnosis not present

## 2018-10-11 DIAGNOSIS — L03119 Cellulitis of unspecified part of limb: Secondary | ICD-10-CM

## 2018-10-11 DIAGNOSIS — J45909 Unspecified asthma, uncomplicated: Secondary | ICD-10-CM | POA: Insufficient documentation

## 2018-10-11 DIAGNOSIS — Z8546 Personal history of malignant neoplasm of prostate: Secondary | ICD-10-CM | POA: Diagnosis not present

## 2018-10-11 LAB — BASIC METABOLIC PANEL
ANION GAP: 4 — AB (ref 5–15)
BUN: 16 mg/dL (ref 8–23)
CALCIUM: 8.7 mg/dL — AB (ref 8.9–10.3)
CO2: 28 mmol/L (ref 22–32)
Chloride: 105 mmol/L (ref 98–111)
Creatinine, Ser: 1.05 mg/dL (ref 0.61–1.24)
GFR calc Af Amer: >60 mL/min (ref >60)
Glucose, Bld: 107 mg/dL — ABNORMAL HIGH (ref 70–99)
POTASSIUM: 4 mmol/L (ref 3.5–5.1)
Sodium: 137 mmol/L (ref 135–145)

## 2018-10-11 LAB — CBC WITH DIFFERENTIAL/PLATELET
ABS IMMATURE GRANULOCYTES: 0.01 10*3/uL (ref 0.00–0.07)
BASOS PCT: 1 %
Basophils Absolute: 0 10*3/uL (ref 0.0–0.1)
EOS ABS: 0.3 10*3/uL (ref 0.0–0.5)
Eosinophils Relative: 9 %
HCT: 37.1 % — ABNORMAL LOW (ref 39.0–52.0)
Hemoglobin: 11.6 g/dL — ABNORMAL LOW (ref 13.0–17.0)
Immature Granulocytes: 0 %
Lymphocytes Relative: 25 %
Lymphs Abs: 0.9 10*3/uL (ref 0.7–4.0)
MCH: 31.4 pg (ref 26.0–34.0)
MCHC: 31.3 g/dL (ref 30.0–36.0)
MCV: 100.3 fL — AB (ref 80.0–100.0)
MONOS PCT: 14 %
Monocytes Absolute: 0.5 10*3/uL (ref 0.1–1.0)
NEUTROS PCT: 51 %
NRBC: 0 % (ref 0.0–0.2)
Neutro Abs: 1.9 10*3/uL (ref 1.7–7.7)
PLATELETS: 132 10*3/uL — AB (ref 150–400)
RBC: 3.7 MIL/uL — ABNORMAL LOW (ref 4.22–5.81)
RDW: 14 % (ref 11.5–15.5)
WBC: 3.6 10*3/uL — ABNORMAL LOW (ref 4.0–10.5)

## 2018-10-11 MED ORDER — HYDROMORPHONE HCL 1 MG/ML IJ SOLN: 1.0000 mg | Freq: Once | INTRAMUSCULAR | Status: DC

## 2018-10-11 MED ORDER — CEPHALEXIN 250 MG PO CAPS
500.0000 mg | ORAL_CAPSULE | Freq: Once | ORAL | Status: AC
  Administered 2018-10-11: 500 mg via ORAL
  Filled 2018-10-11: qty 2

## 2018-10-11 MED ORDER — CEPHALEXIN 500 MG PO CAPS: 500.0000 mg | ORAL_CAPSULE | Freq: Three times a day (TID) | ORAL | 0 refills | Status: DC

## 2018-10-11 NOTE — ED Provider Notes (Signed)
[facility] EMERGENCY DEPARTMENT Provider Note   CSN: [id number] Arrival date & time: 10/11/18  1632     History   Chief Complaint Chief Complaint  Patient presents with  . Foot Pain  . Back Pain    HPI Casey Gates is a 84 y.o. male history of anemia, asthma, CKD, chronic back pain on oxycodone, here presenting with right foot pain.  Patient states that he has right second toe deformity that is chronic.  Patient has noticed right second toe pain and swelling for the last several days.  Patient also noticed redness of the right foot yesterday.  Patient denies any trauma or injury to the foot.  Patient denies any calf pain or trouble breathing.  Denies any previous history of blood clots.  Also has some back pain as well and has a known sacral decub ulcer but denies any drainage from the ulcer.  Patient used to have home health for wound care but it stopped about a month ago.   The history is provided by the patient.    Past Medical History:  Diagnosis Date  . Anemia   . Asthma    AS A CHILD  . Blind left eye   . Cancer [facility])    PROSTATE CANCER  . Cellulitis    frequently, "all over"  . Chronic kidney disease   . Chronic pain syndrome   . Constipation   . DDD (degenerative disc disease), lumbar   . Gastrointestinal stromal tumor (GIST) of stomach ([place])   . GI bleeding   . History of blood transfusion    01/2018  . Insomnia   . Kyphoscoliosis   . Mass in the abdomen 01/2018  . Restless leg   . Sleep apnea    does not wear CPAP  . Wears glasses     Patient Active Problem List   Diagnosis Date Noted  . Multifocal pneumonia 03/18/2018  . Sepsis ([place]) 03/18/2018  . Malignant gastrointestinal stromal tumor (GIST) of stomach ([place]) 03/12/2018  . Urinary retention 02/24/2018  . Gastrointestinal stromal tumor (GIST) of stomach ([place])   . Restless leg syndrome   . Acute GI bleeding 02/16/2018  . Acute blood loss anemia 02/16/2018  . Constipation  01/20/2018  . Exertional shortness of breath 12/30/2017  . Rupture of left quadriceps muscle 07/07/2017  . Rupture of left quadriceps tendon 06/26/2017  . Eczema 06/26/2017  . Chest pain 07/29/2015  . OSA on CPAP 07/10/2015  . CPAP use counseling 07/10/2015  . Excessive daytime sleepiness 02/22/2015  . Sleep apnea 02/22/2015  . Snoring 02/22/2015  . Nocturia more than twice per night 02/22/2015  . Syncope 12/31/2012  . Bifascicular bundle branch block 12/31/2012    Class: Acute  . Cellulitis and abscess of leg, except foot 12/04/2012  . Chronic pain 12/04/2012  . Kyphosis 12/04/2012  . H/O unilateral nephrectomy 12/04/2012  . H/O prostate cancer 12/04/2012  . S/P TURP 12/04/2012    Past Surgical History:  Procedure Laterality Date  . CHOLECYSTECTOMY    . COLONOSCOPY    . ESOPHAGOGASTRODUODENOSCOPY (EGD) WITH PROPOFOL Left 02/16/2018   Procedure: ESOPHAGOGASTRODUODENOSCOPY (EGD) WITH PROPOFOL;  Surgeon: [name], MD;  Location: [facility];  Service: Gastroenterology;  Laterality: Left;  . LAPAROSCOPIC GASTRECTOMY N/A 03/12/2018   Procedure: LAPAROSCOPIC ASSISTED PARTIAL GASTRECTOMY ERAS PATHWAY;  Surgeon: Stark Klein, MD;  Location: [facility];  Service: General;  Laterality: N/A;  . LAPAROSCOPIC PARTIAL GASTRECTOMY  03/12/2018   LAPAROSCOPIC ASSISTED PARTIAL GASTRECTOMY ERAS PATHWAY  .  PROSTATECTOMY    . QUADRICEPS TENDON REPAIR Left 07/07/2017   Procedure: REPAIR QUADRICEP TENDON;  Surgeon: Rod Can, MD;  [facility];  Service: Orthopedics;  Laterality: Left;  . RIGHT CATARACT EXTRACTION  2013  . RIGTH NEPHRECTOMY  2006  . TONSILLECTOMY    . TRANSURETHRAL RESECTION OF PROSTATE  1997        Home Medications    Prior to Admission medications   Medication Sig Start Date End Date Taking? Authorizing Provider  Carboxymethylcellul-Glycerin (CLEAR EYES FOR DRY EYES OP) Apply 1-2 drops to eye 2 (two) times daily as needed (for dry eyes).   Yes [provider]  feeding supplement (BOOST HIGH PROTEIN) LIQD Take 1 Container by mouth daily.   Yes [provider]  ferrous sulfate 325 (65 FE) MG tablet Take 325 mg by mouth daily with breakfast.   Yes [provider]  gabapentin (NEURONTIN) 300 MG capsule Take 1 capsule (300 mg total) by mouth 3 (three) times daily. Patient taking differently: Take 300 mg by mouth See admin instructions. Take 300 mg by mouth two to three times a day 03/24/18  Yes Katherine Roan, MD  imatinib (GLEEVEC) 100 MG tablet TAKE 4 TABLETS (400MG ) BY MOUTH ONCE DAILY ON A FULL STOMACH AND A GLASS OF WATER. Patient taking differently: Take 400 mg by mouth daily. Take 4 tablets (400mg ) by mouth once daily on a full stomach and a glass of water. 09/29/18  Yes Truitt Merle, MD  mirtazapine (REMERON) 45 MG tablet Take 45 mg by mouth at bedtime.   Yes [provider]  oxymorphone (OPANA) 10 MG tablet Take 10 mg by mouth every 6 (six) hours as needed for pain.  07/03/18  Yes [provider]  senna-docusate (SENOKOT-S) 8.6-50 MG tablet Take 1 tablet by mouth at bedtime as needed for mild constipation. Patient taking differently: Take 1-4 tablets by mouth at bedtime as needed for mild constipation.  03/24/18  Yes Katherine Roan, MD  Sodium Phosphates (FLEET ENEMA RE) Place 1 enema rectally as needed (for moderate to severe constipation).   Yes [provider]  mirtazapine (REMERON) 7.5 MG tablet TAKE 1 TABLET(7.5 MG) BY MOUTH AT BEDTIME Patient not taking: No sig reported 07/22/18   Alla Feeling, NP  polyvinyl alcohol (LIQUIFILM TEARS) 1.4 % ophthalmic solution Place 1-2 drops into both eyes 3 (three) times daily as needed (FOR DRY EYES.). Patient not taking: Reported on 10/11/2018 03/24/18   Katherine Roan, MD  protein supplement shake (PREMIER PROTEIN) LIQD Take 325 mLs (11 oz total) by mouth 2 (two) times daily between meals. Patient not taking: Reported on 10/11/2018 03/24/18   Katherine Roan, MD    Family History Family History  Problem Relation Age of Onset  . Bone cancer Mother   . Hypertension Mother   . Arthritis Mother   . Clotting disorder Daughter        died of blood clot     Social History Social History   Tobacco Use  . Smoking status: Never Smoker  . Smokeless tobacco: Never Used  Substance Use Topics  . Alcohol use: No    Alcohol/week: 0.0 standard drinks    Comment: quit 1985, 2 fifths/ week,S/P rehab  . Drug use: No     Allergies   Coconut flavor [flavoring agent]; Coconut oil; and Food   Review of Systems Review of Systems  Musculoskeletal: Positive for back pain.       R  foot pain   All other systems reviewed and are negative.    Physical Exam Updated Vital Signs BP 127/66   Pulse 72   Temp 98.3 F (36.8 C) (Oral)   Resp 16   Ht 6' (1.829 m)   Wt 64.9 kg   SpO2 90%   BMI 19.39 kg/m   Physical Exam  Constitutional: He appears well-developed.  Well appearing for age   HENT:  Head: Normocephalic.  Mouth/Throat: Oropharynx is clear and moist.  Eyes: Pupils are equal, round, and reactive to light. Conjunctivae and EOM are normal.  Neck: Normal range of motion. Neck supple.  Cardiovascular: Normal rate, regular rhythm and normal heart sounds.  Pulmonary/Chest: Effort normal and breath sounds normal. No stridor. No respiratory distress.  Abdominal: Soft. Bowel sounds are normal. He exhibits no distension. There is no tenderness.  Musculoskeletal:  R 2nd toe with some erythema, no obvious purulent drainage. There is surrounding erythema extending to mid foot. Good pulses. No calf tenderness. There is stage 2 sacral decub ulcer with no obvious purulent drainage   Neurological: He is alert.  Skin: Skin is warm. Capillary refill takes less than 2 seconds. There is erythema.  Psychiatric: He has a normal mood and affect.  Nursing note and vitals reviewed.    ED Treatments / Results  Labs (all labs ordered are listed,  but only abnormal results are displayed) Labs Reviewed  CBC WITH DIFFERENTIAL/PLATELET - Abnormal; Notable for the following components:      Result Value   WBC 3.6 (*)    RBC 3.70 (*)    Hemoglobin 11.6 (*)    HCT 37.1 (*)    MCV 100.3 (*)    Platelets 132 (*)    All other components within normal limits  BASIC METABOLIC PANEL - Abnormal; Notable for the following components:   Glucose, Bld 107 (*)    Calcium 8.7 (*)    Anion gap 4 (*)    All other components within normal limits    EKG None  Radiology Dg Foot Complete Right  Result Date: 10/11/2018 CLINICAL DATA:  Swelling and pain of the second toe of the right foot for 36 hours. EXAM: RIGHT FOOT COMPLETE - 3+ VIEW COMPARISON:  07/19/2013 FINDINGS: The MTP joints are a hyper extended and the interphalangeal joints are flexed suggesting hammertoe deformities of the second through fifth toes. As result, the toes are malaligned with respect to the imaging projections, markedly reduced in sensitivity and specificity in assessment. Hallux valgus. Vascular calcifications noted. No malalignment at the Lisfranc joint. I do not see a clear or obvious fracture of the second toe. The middle phalanx is borderline subluxed anteriorly with respect to the proximal phalanx although this may be due to the chronic hammertoe deformity. Mild dorsal soft tissue swelling along the forefoot. Plantar calcaneal spur. Flattened longitudinal arch of the foot compatible with pes planus (also shown in 2014 with a dedicated weight-bearing view). IMPRESSION: 1. Although I do not see a fracture, sensitivity is notably reduced due to the hammertoe deformities which cause the toes to project in a non ideal fashion. 2. Hallux valgus. 3. No bony destructive findings are identified. 4. Mild soft tissue swelling in the dorsum of the forefoot. Pes planus. 5. Plantar calcaneal spur. Electronically Signed   By: Van Clines M.D.   On: 10/11/2018 17:34     Procedures Procedures (including critical care time)  Medications Ordered in ED Medications  cephALEXin (KEFLEX) capsule 500 mg (500 mg Oral  Given 10/11/18 1723)     Initial Impression / Assessment and Plan / ED Course  I have reviewed the triage vital signs and the nursing notes.  Pertinent labs & imaging results that were available during my care of the patient were reviewed by me and considered in my medical decision making (see chart for details).    Casey Gates is a 84 y.o. male here with R foot pain and redness. Likely early cellulitis. Afebrile, well appearing. Will get CBC, xrays. Will give keflex. Patient doesn't appear septic.   6:53 PM Xray showed no osteo or fractures. WBC nl. I think early cellulitis vs local inflammation. Will dc home with keflex and have him follow up with podiatry. Left message with case management to get nursing care for his sacral wounds.    Final Clinical Impressions(s) / ED Diagnoses   Final diagnoses:  None    ED Discharge Orders    None       Drenda Freeze, MD 10/11/18 [phone]

## 2018-10-11 NOTE — Discharge Instructions (Addendum)
Take keflex three times daily for your foot ulcer   Continue your current meds otherwise, including your pain meds.   Case management will call you tomorrow regarding home health needs   See podiatry for follow up   Follow up with your doctor  Return to ER if you have worse toe pain, foot swelling or redness, fevers.

## 2018-10-11 NOTE — ED Triage Notes (Signed)
Pt from [facility] with son c/o right foot pain and swelling, and a sacral wound. Pt states he noticed swelling and redness in his right foot yesterday, worsening today. Pt c/o 3/10 pain. Pt also endorses lower back pain, pt rolled on his side to reveal a sacral wound, pt believes this is chronic. Pt son states pt had home health for wound care, but it stopped x1 month ago.

## 2018-10-11 NOTE — ED Notes (Signed)
Patient verbalizes understanding of discharge instructions. Opportunity for questioning and answers were provided. Armband removed by staff, pt discharged from ED.  

## 2018-10-19 ENCOUNTER — Ambulatory Visit: Payer: Medicare Other | Admitting: Podiatry

## 2018-10-26 ENCOUNTER — Other Ambulatory Visit: Payer: Self-pay | Admitting: Hematology

## 2018-10-26 DIAGNOSIS — C49A2 Gastrointestinal stromal tumor of stomach: Secondary | ICD-10-CM

## 2018-10-26 MED FILL — IMATINIB MESYLATE 100 MG TA: 100 | 30 days supply | Qty: 120 | Fill #0

## 2018-11-03 ENCOUNTER — Encounter (HOSPITAL_BASED_OUTPATIENT_CLINIC_OR_DEPARTMENT_OTHER): Payer: Self-pay | Admitting: *Deleted

## 2018-11-03 ENCOUNTER — Other Ambulatory Visit (HOSPITAL_COMMUNITY): Payer: Self-pay | Admitting: Orthopedic Surgery

## 2018-11-03 ENCOUNTER — Other Ambulatory Visit: Payer: Self-pay

## 2018-11-05 ENCOUNTER — Ambulatory Visit (HOSPITAL_BASED_OUTPATIENT_CLINIC_OR_DEPARTMENT_OTHER): Payer: Medicare Other | Admitting: Anesthesiology

## 2018-11-05 ENCOUNTER — Ambulatory Visit (HOSPITAL_BASED_OUTPATIENT_CLINIC_OR_DEPARTMENT_OTHER)
Admission: RE | Admit: 2018-11-05 | Discharge: 2018-11-06 | Disposition: A | Discharge status: Home / Self Care | Payer: Medicare Other | Source: Ambulatory Visit | Attending: Orthopedic Surgery | Admitting: Orthopedic Surgery

## 2018-11-05 ENCOUNTER — Encounter (HOSPITAL_BASED_OUTPATIENT_CLINIC_OR_DEPARTMENT_OTHER)
Admission: RE | Disposition: A | Discharge status: Home / Self Care | Payer: Self-pay | Source: Ambulatory Visit | Attending: Orthopedic Surgery

## 2018-11-05 ENCOUNTER — Encounter (HOSPITAL_BASED_OUTPATIENT_CLINIC_OR_DEPARTMENT_OTHER): Payer: Self-pay

## 2018-11-05 ENCOUNTER — Other Ambulatory Visit: Payer: Self-pay

## 2018-11-05 DIAGNOSIS — Z905 Acquired absence of kidney: Secondary | ICD-10-CM | POA: Diagnosis not present

## 2018-11-05 DIAGNOSIS — Z79891 Long term (current) use of opiate analgesic: Secondary | ICD-10-CM | POA: Insufficient documentation

## 2018-11-05 DIAGNOSIS — G4700 Insomnia, unspecified: Secondary | ICD-10-CM | POA: Insufficient documentation

## 2018-11-05 DIAGNOSIS — Z91018 Allergy to other foods: Secondary | ICD-10-CM | POA: Insufficient documentation

## 2018-11-05 DIAGNOSIS — N189 Chronic kidney disease, unspecified: Secondary | ICD-10-CM | POA: Insufficient documentation

## 2018-11-05 DIAGNOSIS — M5136 Other intervertebral disc degeneration, lumbar region: Secondary | ICD-10-CM | POA: Diagnosis not present

## 2018-11-05 DIAGNOSIS — Z8500 Personal history of malignant neoplasm of unspecified digestive organ: Secondary | ICD-10-CM | POA: Diagnosis not present

## 2018-11-05 DIAGNOSIS — H5462 Unqualified visual loss, left eye, normal vision right eye: Secondary | ICD-10-CM | POA: Insufficient documentation

## 2018-11-05 DIAGNOSIS — G894 Chronic pain syndrome: Secondary | ICD-10-CM | POA: Insufficient documentation

## 2018-11-05 DIAGNOSIS — M2041 Other hammer toe(s) (acquired), right foot: Secondary | ICD-10-CM | POA: Insufficient documentation

## 2018-11-05 DIAGNOSIS — G4730 Sleep apnea, unspecified: Secondary | ICD-10-CM | POA: Diagnosis not present

## 2018-11-05 DIAGNOSIS — Z79899 Other long term (current) drug therapy: Secondary | ICD-10-CM | POA: Insufficient documentation

## 2018-11-05 DIAGNOSIS — L97519 Non-pressure chronic ulcer of other part of right foot with unspecified severity: Secondary | ICD-10-CM | POA: Diagnosis not present

## 2018-11-05 DIAGNOSIS — L97512 Non-pressure chronic ulcer of other part of right foot with fat layer exposed: Secondary | ICD-10-CM | POA: Diagnosis present

## 2018-11-05 DIAGNOSIS — Z8546 Personal history of malignant neoplasm of prostate: Secondary | ICD-10-CM | POA: Insufficient documentation

## 2018-11-05 HISTORY — PX: AMPUTATION TOE: SHX6595

## 2018-11-05 HISTORY — DX: Non-pressure chronic ulcer of other part of right foot with unspecified severity: L97.519

## 2018-11-05 SURGERY — AMPUTATION, TOE
Anesthesia: Monitor Anesthesia Care | Site: Foot | Laterality: Right

## 2018-11-05 MED ORDER — GABAPENTIN 300 MG PO CAPS
ORAL_CAPSULE | ORAL | Status: AC
  Filled 2018-11-05: qty 1

## 2018-11-05 MED ORDER — BUPIVACAINE-EPINEPHRINE 0.25% -1:200000 IJ SOLN
INTRAMUSCULAR | Status: DC | PRN
  Administered 2018-11-05: 10 mL

## 2018-11-05 MED ORDER — DOCUSATE SODIUM 100 MG PO CAPS
100.0000 mg | ORAL_CAPSULE | Freq: Two times a day (BID) | ORAL | Status: DC
  Administered 2018-11-05: 100 mg via ORAL
  Filled 2018-11-05: qty 1

## 2018-11-05 MED ORDER — SODIUM CHLORIDE 0.9 % IV SOLN
INTRAVENOUS | Status: DC
  Administered 2018-11-05: 16:00:00 via INTRAVENOUS

## 2018-11-05 MED ORDER — SODIUM CHLORIDE 0.9 % IV SOLN: INTRAVENOUS | Status: DC

## 2018-11-05 MED ORDER — OXYMORPHONE HCL 10 MG PO TABS
10.0000 mg | ORAL_TABLET | Freq: Four times a day (QID) | ORAL | Status: DC | PRN
  Administered 2018-11-06: 10 mg via ORAL

## 2018-11-05 MED ORDER — CHLORHEXIDINE GLUCONATE 4 % EX LIQD: 60.0000 mL | Freq: Once | CUTANEOUS | Status: DC

## 2018-11-05 MED ORDER — SCOPOLAMINE 1 MG/3DAYS TD PT72: 1.0000 | MEDICATED_PATCH | Freq: Once | TRANSDERMAL | Status: DC | PRN

## 2018-11-05 MED ORDER — MUPIROCIN 2 % EX OINT
1.0000 "application " | TOPICAL_OINTMENT | Freq: Two times a day (BID) | CUTANEOUS | Status: DC
  Administered 2018-11-05: 1 via NASAL

## 2018-11-05 MED ORDER — ENOXAPARIN SODIUM 40 MG/0.4ML ~~LOC~~ SOLN: 40.0000 mg | SUBCUTANEOUS | Status: DC

## 2018-11-05 MED ORDER — ONDANSETRON HCL 4 MG/2ML IJ SOLN: 4.0000 mg | Freq: Four times a day (QID) | INTRAMUSCULAR | Status: DC | PRN

## 2018-11-05 MED ORDER — MIRTAZAPINE 45 MG PO TABS: 45.0000 mg | ORAL_TABLET | Freq: Every day | ORAL | Status: DC

## 2018-11-05 MED ORDER — ONDANSETRON HCL 4 MG PO TABS: 4.0000 mg | ORAL_TABLET | Freq: Four times a day (QID) | ORAL | Status: DC | PRN

## 2018-11-05 MED ORDER — MORPHINE SULFATE (PF) 4 MG/ML IV SOLN: 0.5000 mg | INTRAVENOUS | Status: DC | PRN

## 2018-11-05 MED ORDER — FENTANYL CITRATE (PF) 100 MCG/2ML IJ SOLN: 50.0000 ug | INTRAMUSCULAR | Status: DC | PRN

## 2018-11-05 MED ORDER — MIDAZOLAM HCL 2 MG/2ML IJ SOLN: 1.0000 mg | INTRAMUSCULAR | Status: DC | PRN

## 2018-11-05 MED ORDER — FERROUS SULFATE 325 (65 FE) MG PO TABS: 325.0000 mg | ORAL_TABLET | Freq: Every day | ORAL | Status: DC

## 2018-11-05 MED ORDER — IMATINIB MESYLATE 100 MG PO TABS: 600.0000 mg | ORAL_TABLET | Freq: Every day | ORAL | Status: DC

## 2018-11-05 MED ORDER — CEFAZOLIN SODIUM-DEXTROSE 2-4 GM/100ML-% IV SOLN
2.0000 g | INTRAVENOUS | Status: AC
  Administered 2018-11-05: 2 g via INTRAVENOUS

## 2018-11-05 MED ORDER — SENNOSIDES-DOCUSATE SODIUM 8.6-50 MG PO TABS: 1.0000 | ORAL_TABLET | Freq: Every evening | ORAL | Status: DC | PRN

## 2018-11-05 MED ORDER — LACTATED RINGERS IV SOLN
INTRAVENOUS | Status: DC
  Administered 2018-11-05: 13:00:00 via INTRAVENOUS

## 2018-11-05 MED ORDER — ACETAMINOPHEN 325 MG PO TABS: 325.0000 mg | ORAL_TABLET | Freq: Four times a day (QID) | ORAL | Status: DC | PRN

## 2018-11-05 MED ORDER — CEFAZOLIN SODIUM-DEXTROSE 2-4 GM/100ML-% IV SOLN
INTRAVENOUS | Status: AC
  Filled 2018-11-05: qty 100

## 2018-11-05 MED ORDER — PROPOFOL 10 MG/ML IV BOLUS
INTRAVENOUS | Status: DC | PRN
  Administered 2018-11-05 (×6): 10 mg via INTRAVENOUS

## 2018-11-05 MED ORDER — GABAPENTIN 300 MG PO CAPS
300.0000 mg | ORAL_CAPSULE | Freq: Three times a day (TID) | ORAL | Status: DC
  Administered 2018-11-05: 300 mg via ORAL
  Filled 2018-11-05: qty 1

## 2018-11-05 SURGICAL SUPPLY — 59 items
BLADE AVERAGE 25MMX9MM (BLADE)
BLADE AVERAGE 25X9 (BLADE) IMPLANT
BLADE OSC/SAG .038X5.5 CUT EDG (BLADE) IMPLANT
BLADE SURG 10 STRL SS (BLADE) IMPLANT
BLADE SURG 15 STRL LF DISP TIS (BLADE) ×1 IMPLANT
BLADE SURG 15 STRL SS (BLADE) ×2
BNDG COHESIVE 4X5 TAN STRL (GAUZE/BANDAGES/DRESSINGS) ×3 IMPLANT
BNDG CONFORM 3 STRL LF (GAUZE/BANDAGES/DRESSINGS) IMPLANT
BNDG ESMARK 4X9 LF (GAUZE/BANDAGES/DRESSINGS) ×3 IMPLANT
CHLORAPREP W/TINT 26ML (MISCELLANEOUS) ×3 IMPLANT
COVER BACK TABLE 60X90IN (DRAPES) ×3 IMPLANT
COVER WAND RF STERILE (DRAPES) IMPLANT
CUFF TOURNIQUET SINGLE 24IN (TOURNIQUET CUFF) IMPLANT
DECANTER SPIKE VIAL GLASS SM (MISCELLANEOUS) IMPLANT
DRAPE EXTREMITY T 121X128X90 (DRAPE) ×3 IMPLANT
DRAPE SURG 17X23 STRL (DRAPES) ×3 IMPLANT
DRAPE U-SHAPE 47X51 STRL (DRAPES) ×3 IMPLANT
DRSG EMULSION OIL 3X3 NADH (GAUZE/BANDAGES/DRESSINGS) IMPLANT
DRSG MEPITEL 4X7.2 (GAUZE/BANDAGES/DRESSINGS) ×3 IMPLANT
DRSG PAD ABDOMINAL 8X10 ST (GAUZE/BANDAGES/DRESSINGS) IMPLANT
ELECT REM PT RETURN 9FT ADLT (ELECTROSURGICAL) ×3
ELECTRODE REM PT RTRN 9FT ADLT (ELECTROSURGICAL) ×1 IMPLANT
GAUZE SPONGE 4X4 12PLY STRL (GAUZE/BANDAGES/DRESSINGS) ×3 IMPLANT
GLOVE BIO SURGEON STRL SZ8 (GLOVE) ×3 IMPLANT
GLOVE BIOGEL PI IND STRL 8 (GLOVE) ×3 IMPLANT
GLOVE BIOGEL PI INDICATOR 8 (GLOVE) ×6
GLOVE ECLIPSE 8.0 STRL XLNG CF (GLOVE) ×3 IMPLANT
GOWN STRL REUS W/ TWL LRG LVL3 (GOWN DISPOSABLE) ×1 IMPLANT
GOWN STRL REUS W/ TWL XL LVL3 (GOWN DISPOSABLE) ×2 IMPLANT
GOWN STRL REUS W/TWL LRG LVL3 (GOWN DISPOSABLE) ×2
GOWN STRL REUS W/TWL XL LVL3 (GOWN DISPOSABLE) ×4
NDL SAFETY ECLIPSE 18X1.5 (NEEDLE) IMPLANT
NEEDLE HYPO 18GX1.5 SHARP (NEEDLE)
NEEDLE HYPO 25X1 1.5 SAFETY (NEEDLE) IMPLANT
NS IRRIG 1000ML POUR BTL (IV SOLUTION) ×3 IMPLANT
PACK BASIN DAY SURGERY FS (CUSTOM PROCEDURE TRAY) ×3 IMPLANT
PAD CAST 4YDX4 CTTN HI CHSV (CAST SUPPLIES) IMPLANT
PADDING CAST COTTON 4X4 STRL (CAST SUPPLIES)
PENCIL BUTTON HOLSTER BLD 10FT (ELECTRODE) ×3 IMPLANT
SANITIZER HAND PURELL 535ML FO (MISCELLANEOUS) ×3 IMPLANT
SHEET MEDIUM DRAPE 40X70 STRL (DRAPES) ×3 IMPLANT
SLEEVE SCD COMPRESS KNEE MED (MISCELLANEOUS) ×3 IMPLANT
SPONGE LAP 18X18 RF (DISPOSABLE) ×3 IMPLANT
STOCKINETTE 6  STRL (DRAPES) ×2
STOCKINETTE 6 STRL (DRAPES) ×1 IMPLANT
SUCTION FRAZIER HANDLE 10FR (MISCELLANEOUS)
SUCTION TUBE FRAZIER 10FR DISP (MISCELLANEOUS) IMPLANT
SUT ETHILON 2 0 FS 18 (SUTURE) IMPLANT
SUT ETHILON 2 0 FSLX (SUTURE) IMPLANT
SUT ETHILON 3 0 PS 1 (SUTURE) IMPLANT
SUT MNCRL AB 3-0 PS2 18 (SUTURE) IMPLANT
SWAB COLLECTION DEVICE MRSA (MISCELLANEOUS) IMPLANT
SWAB CULTURE ESWAB REG 1ML (MISCELLANEOUS) IMPLANT
SYR BULB 3OZ (MISCELLANEOUS) ×3 IMPLANT
SYR CONTROL 10ML LL (SYRINGE) IMPLANT
TOWEL GREEN STERILE FF (TOWEL DISPOSABLE) ×3 IMPLANT
TUBE CONNECTING 20'X1/4 (TUBING)
TUBE CONNECTING 20X1/4 (TUBING) IMPLANT
UNDERPAD 30X30 (UNDERPADS AND DIAPERS) ×3 IMPLANT

## 2018-11-05 NOTE — H&P (Signed)
Casey Gates is an 84 y.o. male.   Chief Complaint: Right foot pain HPI: The patient is a 84 year old male with a complex medical history.  He has a long history of a right second hammertoe deformity and has developed an ulcer on the dorsum that is not healing.  He has failed nonoperative treatment for this ulcer including shoewear modification, antibiotics and local wound care.  He presents today for right foot second toe amputation.  Past Medical History:  Diagnosis Date  . Anemia   . Asthma    AS A CHILD  . Blind left eye   . Cancer [facility])    PROSTATE CANCER  . Cellulitis    frequently, "all over"  . Chronic kidney disease   . Chronic pain syndrome   . Chronic ulcer of toe, right, with unspecified severity ([place])    2nd toe  . Constipation   . DDD (degenerative disc disease), lumbar   . Gastrointestinal stromal tumor (GIST) of stomach ([place])   . GI bleeding   . History of blood transfusion    01/2018  . Insomnia   . Kyphoscoliosis   . Mass in the abdomen 01/2018  . Restless leg   . Sleep apnea    does not wear CPAP  . Wears glasses     Past Surgical History:  Procedure Laterality Date  . CHOLECYSTECTOMY    . COLONOSCOPY    . ESOPHAGOGASTRODUODENOSCOPY (EGD) WITH PROPOFOL Left 02/16/2018   Procedure: ESOPHAGOGASTRODUODENOSCOPY (EGD) WITH PROPOFOL;  Surgeon: [name], MD;  Location: [facility];  Service: Gastroenterology;  Laterality: Left;  . LAPAROSCOPIC GASTRECTOMY N/A 03/12/2018   Procedure: LAPAROSCOPIC ASSISTED PARTIAL GASTRECTOMY ERAS PATHWAY;  Surgeon: Stark Klein, MD;  Location: [facility];  Service: General;  Laterality: N/A;  . LAPAROSCOPIC PARTIAL GASTRECTOMY  03/12/2018   LAPAROSCOPIC ASSISTED PARTIAL GASTRECTOMY ERAS PATHWAY  . PROSTATECTOMY    . QUADRICEPS TENDON REPAIR Left 07/07/2017   Procedure: REPAIR QUADRICEP TENDON;  Surgeon: Rod Can, MD;  Location: [facility];  Service: Orthopedics;  Laterality: Left;  . RIGHT CATARACT EXTRACTION  2013  . RIGTH  NEPHRECTOMY  2006  . TONSILLECTOMY    . TRANSURETHRAL RESECTION OF PROSTATE  1997    Family History  Problem Relation Age of Onset  . Bone cancer Mother   . Hypertension Mother   . Arthritis Mother   . Clotting disorder Daughter        died of blood clot    Social History:  reports that he has never smoked. He has never used smokeless tobacco. He reports that he does not drink alcohol or use drugs.  Allergies:  Allergies  Allergen Reactions  . Coconut Flavor [Flavoring Agent] Hypertension    Anything that is related to coconut---LOSS OF CONSCIOUSNESS ([place])  . Coconut Oil Other (See Comments)    Anything that is related to coconut--- LOSS OF CONSCIOUSNESS ([place])  . Food Anaphylaxis    ALLERGIC TO ALL NUTS WITH THE EXCEPTION OF CASHEWS, ALMONDS, AND PEANUTS ("Peanuts" are legumes, however)    Medications Prior to Admission  Medication Sig Dispense Refill  . doxycycline (VIBRAMYCIN) 100 MG capsule Take 100 mg by mouth 2 (two) times daily.    . feeding supplement (BOOST HIGH PROTEIN) LIQD Take 1 Container by mouth daily.    . ferrous sulfate 325 (65 FE) MG tablet Take 325 mg by mouth daily with breakfast.    . gabapentin (NEURONTIN) 300 MG capsule Take 1 capsule (300 mg total) by mouth 3 (three)  times daily. (Patient taking differently: Take 300 mg by mouth See admin instructions. Take 300 mg by mouth two to three times a day)    . imatinib (GLEEVEC) 100 MG tablet TAKE 4 TABLETS (400MG ) BY MOUTH ONCE DAILY ON A FULL STOMACH AND A GLASS OF WATER. 120 tablet 0  . mirtazapine (REMERON) 45 MG tablet Take 45 mg by mouth at bedtime.    . mupirocin ointment (BACTROBAN) 2 % Place 1 application into the nose 2 (two) times daily.    Marland Kitchen oxymorphone (OPANA) 10 MG tablet Take 10 mg by mouth every 6 (six) hours as needed for pain.     Marland Kitchen senna-docusate (SENOKOT-S) 8.6-50 MG tablet Take 1 tablet by mouth at bedtime as needed for mild constipation. (Patient taking differently: Take 1-4 tablets  by mouth at bedtime as needed for mild constipation. )    . Sodium Phosphates (FLEET ENEMA RE) Place 1 enema rectally as needed (for moderate to severe constipation).    . Carboxymethylcellul-Glycerin (CLEAR EYES FOR DRY EYES OP) Apply 1-2 drops to eye 2 (two) times daily as needed (for dry eyes).      No results found for this or any previous visit (from the past 48 hour(s)). No results found.  ROS no recent fever, chills, nausea, vomiting or changes in his appetite  Blood pressure (!) 124/59, pulse 71, temperature 98.4 F (36.9 C), temperature source Oral, resp. rate 16, height 5\' 11"  (1.803 m), weight 70 kg, SpO2 97 %. Physical Exam  Well-nourished well-developed elderly male in no apparent distress.  He is alert and oriented to person and place.  Extraocular motions are intact.  Respirations are unlabored.  His right foot has a significant hammertoe deformity.  There is an ulcer over the dorsum of the PIP joint.  No cellulitis.  Brisk capillary refill to the toes.  1+ dorsalis pedis pulse.  No lymphadenopathy.  Diminished sensibility to the right foot dorsally and plantarly.  4 out of 5 strength in plantar flexion and dorsiflexion of the ankle.  Assessment/Plan Right foot chronic hammertoe deformity with nonhealing dorsal ulcer -to the operating room today for amputation of the second toe.  The risks and benefits of the alternative treatment options have been discussed in detail.  The patient wishes to proceed with surgery and specifically understands risks of bleeding, infection, nerve damage, blood clots, need for additional surgery, amputation and death.   Wylene Simmer, MD Nov 08, 2018, 1:48 PM

## 2018-11-05 NOTE — Anesthesia Postprocedure Evaluation (Signed)
Anesthesia Post Note  Patient: Casey Gates  Procedure(s) Performed: Right 2nd toe ampuation (Right Foot)     Patient location during evaluation: PACU Anesthesia Type: MAC Level of consciousness: awake and alert Pain management: pain level controlled Vital Signs Assessment: post-procedure vital signs reviewed and stable Respiratory status: spontaneous breathing, nonlabored ventilation, respiratory function stable and patient connected to nasal cannula oxygen Cardiovascular status: stable and blood pressure returned to baseline Postop Assessment: no apparent nausea or vomiting Anesthetic complications: no    Last Vitals:  Vitals:   11/05/18 1500 11/05/18 1530  BP: 138/73 132/67  Pulse: 74 72  Resp: 14 16  Temp:  36.5 C  SpO2: 97% 97%    Last Pain:  Vitals:   11/05/18 1530  TempSrc:   PainSc: 0-No pain                 Dejay Kronk

## 2018-11-05 NOTE — Op Note (Signed)
11/05/2018  2:29 PM  PATIENT:  Casey Gates  84 y.o. male  PRE-OPERATIVE DIAGNOSIS: 1.  Chronic right second toe ulcer      2.  Right second hammertoe deformity  POST-OPERATIVE DIAGNOSIS: Same  Procedure(s): Right second toe amputation through the MTP joint  SURGEON:  Wylene Simmer, MD  ASSISTANT: None  ANESTHESIA:   Local, MAC  EBL:  minimal   TOURNIQUET: Less than 10 minutes with an ankle Esmarch  COMPLICATIONS:  None apparent  DISPOSITION:  Extubated, awake and stable to recovery.  INDICATION FOR PROCEDURE: The patient is a 84 year old male with a long history of a right second hammertoe deformity.  He has developed a chronic ulcer over the PIP joint that has led to several infections.  He has failed nonoperative treatment to date including antibiotics, local wound care and shoewear modification.  He presents today for right second toe amputation.  The risks and benefits of the alternative treatment options have been discussed in detail.  The patient wishes to proceed with surgery and specifically understands risks of bleeding, infection, nerve damage, blood clots, need for additional surgery, amputation and death.  PROCEDURE IN DETAIL:  After pre operative consent was obtained, and the correct operative site was identified, the patient was brought to the operating room and placed supine on the OR table.  Anesthesia was administered.  Pre-operative antibiotics were administered.  A surgical timeout was taken.  The right lower extremity was prepped and draped in standard sterile fashion.  The second toe was anesthetized with a metatarsal block of quarter percent Marcaine with epinephrine.  Once adequate anesthesia was achieved the foot was exsanguinated and a 4 inch Esmarch tourniquet wrapped around the ankle.  A racquet style incision was marked on the skin around the base of the toe.  The incision was made and dissection was carried down through the subcutaneous tissues.  Subperiosteal  dissection was then carried to the base of the proximal phalanx, and the toe was disarticulated through the MTP joint.  The toe was passed off the field.  The wound was irrigated.  Neurovascular bundles were cauterized.  The incision was closed with 2-0 nylon horizontal mattress sutures.  Sterile dressings were applied followed by a compression wrap.  The tourniquet was released after application of the dressings.  The patient was awakened from anesthesia and transported to the recovery room in stable condition.  FOLLOW UP PLAN: The patient will be weightbearing as tolerated in a flat postop shoe.  He will be observed overnight for blood pressure, hydration status, pain control and fall prevention.

## 2018-11-05 NOTE — Transfer of Care (Signed)
Immediate Anesthesia Transfer of Care Note  Patient: Casey Gates  Procedure(s) Performed: Right 2nd toe ampuation (Right Foot)  Patient Location: PACU  Anesthesia Type:MAC  Level of Consciousness: awake, alert , oriented and patient cooperative  Airway & Oxygen Therapy: Patient Spontanous Breathing and Patient connected to face mask oxygen  Post-op Assessment: Report given to RN and Post -op Vital signs reviewed and stable  Post vital signs: Reviewed and stable  Last Vitals:  Vitals Value Taken Time  BP 106/63 11/05/2018  2:30 PM  Temp    Pulse 73 11/05/2018  2:32 PM  Resp 17 11/05/2018  2:32 PM  SpO2 100 % 11/05/2018  2:32 PM  Vitals shown include unvalidated device data.  Last Pain:  Vitals:   11/05/18 1215  TempSrc: Oral  PainSc: 0-No pain         Complications: No apparent anesthesia complications

## 2018-11-05 NOTE — Anesthesia Preprocedure Evaluation (Addendum)
Anesthesia Evaluation  Patient identified by MRN, date of birth, ID band Patient awake    Reviewed: Allergy & Precautions, NPO status , Patient's Chart, lab work & pertinent test results  Airway Mallampati: II  TM Distance: >3 FB Neck ROM: Full    Dental  (+) Dental Advisory Given, Missing, Chipped, Poor Dentition   Pulmonary asthma , sleep apnea ,    Pulmonary exam normal breath sounds clear to auscultation       Cardiovascular Normal cardiovascular exam+ dysrhythmias (Bundle branch block)  Rhythm:Regular Rate:Normal  EKG 02/16/18: SR, atrial premature complex, RBBB and LAFB, borderline ST elevation, lateral leads.  No significant change since 12/30/17.   Echo 12/31/17: EF 65%to 70%.Trivial AI. Mildly dilated LA.   Carotid U/S 05/11/13: Doppler velocities suggest less than 40% stenoses of the bilateral proximal internal carotid arteries.  CTA chest 12/30/17: Mild aneurysmal dilatation of the ascending thoracic aorta, 4.3 cm. Cardiomegaly. Bibasilar atelectasis.     Neuro/Psych Left eye blindness, restless legs negative psych ROS   GI/Hepatic Neg liver ROS, GIST - states has not had any N/V recently  CT Abd. - 9.7 x 8.8 x 10.9 cm aggressive appearing heterogeneously enhancing mass along the lesser curvature of the stomach, highly concerning for primary gastric neoplasm. This is intimately associated with the undersurface of the left lobe of the liver without definitive evidence of direct invasion (although superficial involvement of the hepatic capsule is suspected given the loss of the intervening fat plane). There is also an indeterminate left adrenal nodule measuring 2.0 x 1.4 cm, which could be metastatic.  Areas of cylindrical bronchiectasis and chronic scarring in the lower lobes of the lungs bilaterally. Aortic atherosclerosis, in addition to left main and 3 vessel coronary artery disease. Right kidney is either surgically  or congenitally absent, or severely atrophic.   Endo/Other  negative endocrine ROS  Renal/GU CRFRenal diseaseS/p right nephrectomy   Prostate cancer    Musculoskeletal  (+) Arthritis , Chronic pain syndrome; kyphoscoliosis   Abdominal   Peds  Hematology  (+) anemia ,   Anesthesia Other Findings   Reproductive/Obstetrics                            Anesthesia Physical  Anesthesia Plan  ASA: III  Anesthesia Plan: MAC   Post-op Pain Management:    Induction:   PONV Risk Score and Plan: 4 or greater and Treatment may vary due to age or medical condition and Propofol infusion  Airway Management Planned: Natural Airway, Nasal Cannula and Mask  Additional Equipment: None  Intra-op Plan:   Post-operative Plan: Extubation in OR  Informed Consent: I have reviewed the patients History and Physical, chart, labs and discussed the procedure including the risks, benefits and alternatives for the proposed anesthesia with the patient or authorized representative who has indicated his/her understanding and acceptance.   Dental advisory given  Plan Discussed with: CRNA, Anesthesiologist and Surgeon  Anesthesia Plan Comments:        Anesthesia Quick Evaluation

## 2018-11-05 NOTE — Discharge Instructions (Addendum)
Casey Simmer, MD [place]  Please read the following information regarding your care after surgery.  Medications  You only need a prescription for the narcotic pain medicine (ex. oxycodone, Percocet, Norco).  All of the other medicines listed below are available over the counter. X acetominophen (Tylenol) 650 mg every 4-6 hours as you need for minor to moderate pain X Opana as prescribed for severe pain   Weight Bearing ? Bear weight when you are able on your operated leg or foot. X Bear weight only on your operated foot in the post-op shoe. ? Do not bear any weight on the operated leg or foot.  Cast / Splint / Dressing X Keep your splint, cast or dressing clean and dry.  Dont put anything (coat hanger, pencil, etc) down inside of it.  If it gets damp, use a hair dryer on the cool setting to dry it.  If it gets soaked, call the office to schedule an appointment for a cast change. ? Remove your dressing 3 days after surgery and cover the incisions with dry dressings.    After your dressing, cast or splint is removed; you may shower, but do not soak or scrub the wound.  Allow the water to run over it, and then gently pat it dry.  Swelling It is normal for you to have swelling where you had surgery.  To reduce swelling and pain, keep your toes above your nose for at least 3 days after surgery.  It may be necessary to keep your foot or leg elevated for several weeks.  If it hurts, it should be elevated.  Follow Up Call my office at [phone] when you are discharged from the hospital or surgery center to schedule an appointment to be seen two weeks after surgery.  Call my office at [phone] if you develop a fever >101.5 F, nausea, vomiting, bleeding from the surgical site or severe pain.         Post Anesthesia Home Care Instructions  Activity: Get plenty of rest for the remainder of the day. A responsible individual must stay with you for 24 hours following  the procedure.  For the next 24 hours, DO NOT: -Drive a car -Paediatric nurse -Drink alcoholic beverages -Take any medication unless instructed by your physician -Make any legal decisions or sign important papers.  Meals: Start with liquid foods such as gelatin or soup. Progress to regular foods as tolerated. Avoid greasy, spicy, heavy foods. If nausea and/or vomiting occur, drink only clear liquids until the nausea and/or vomiting subsides. Call your physician if vomiting continues.  Special Instructions/Symptoms: Your throat may feel dry or sore from the anesthesia or the breathing tube placed in your throat during surgery. If this causes discomfort, gargle with warm salt water. The discomfort should disappear within 24 hours.  If you had a scopolamine patch placed behind your ear for the management of post- operative nausea and/or vomiting:  1. The medication in the patch is effective for 72 hours, after which it should be removed.  Wrap patch in a tissue and discard in the trash. Wash hands thoroughly with soap and water. 2. You may remove the patch earlier than 72 hours if you experience unpleasant side effects which may include dry mouth, dizziness or visual disturbances. 3. Avoid touching the patch. Wash your hands with soap and water after contact with the patch.

## 2018-11-06 ENCOUNTER — Encounter (HOSPITAL_BASED_OUTPATIENT_CLINIC_OR_DEPARTMENT_OTHER): Payer: Self-pay | Admitting: Orthopedic Surgery

## 2018-11-06 MED ORDER — FENTANYL CITRATE (PF) 100 MCG/2ML IJ SOLN: 25.0000 ug | INTRAMUSCULAR | Status: DC | PRN

## 2018-11-06 MED ORDER — MEPERIDINE HCL 25 MG/ML IJ SOLN: 6.2500 mg | INTRAMUSCULAR | Status: DC | PRN

## 2018-11-11 ENCOUNTER — Emergency Department (HOSPITAL_COMMUNITY)
Admission: EM | Admit: 2018-11-11 | Discharge: 2018-11-12 | Disposition: A | Discharge status: Home / Self Care | Payer: Medicare Other | Attending: Emergency Medicine | Admitting: Emergency Medicine

## 2018-11-11 ENCOUNTER — Encounter (HOSPITAL_COMMUNITY): Payer: Self-pay | Admitting: Emergency Medicine

## 2018-11-11 ENCOUNTER — Other Ambulatory Visit: Payer: Self-pay

## 2018-11-11 DIAGNOSIS — L03115 Cellulitis of right lower limb: Secondary | ICD-10-CM

## 2018-11-11 DIAGNOSIS — R609 Edema, unspecified: Secondary | ICD-10-CM

## 2018-11-11 DIAGNOSIS — J45909 Unspecified asthma, uncomplicated: Secondary | ICD-10-CM | POA: Insufficient documentation

## 2018-11-11 DIAGNOSIS — N189 Chronic kidney disease, unspecified: Secondary | ICD-10-CM | POA: Insufficient documentation

## 2018-11-11 DIAGNOSIS — Z79899 Other long term (current) drug therapy: Secondary | ICD-10-CM | POA: Insufficient documentation

## 2018-11-11 LAB — CBC WITH DIFFERENTIAL/PLATELET
Abs Immature Granulocytes: 0.01 10*3/uL (ref 0.00–0.07)
BASOS PCT: 1 %
Basophils Absolute: 0 10*3/uL (ref 0.0–0.1)
EOS PCT: 13 %
Eosinophils Absolute: 0.5 10*3/uL (ref 0.0–0.5)
HEMATOCRIT: 37.5 % — AB (ref 39.0–52.0)
Hemoglobin: 11.8 g/dL — ABNORMAL LOW (ref 13.0–17.0)
Immature Granulocytes: 0 %
Lymphocytes Relative: 25 %
Lymphs Abs: 1.1 10*3/uL (ref 0.7–4.0)
MCH: 31.6 pg (ref 26.0–34.0)
MCHC: 31.5 g/dL (ref 30.0–36.0)
MCV: 100.5 fL — ABNORMAL HIGH (ref 80.0–100.0)
Monocytes Absolute: 0.5 10*3/uL (ref 0.1–1.0)
Monocytes Relative: 13 %
Neutro Abs: 2 10*3/uL (ref 1.7–7.7)
Neutrophils Relative %: 48 %
Platelets: 171 10*3/uL (ref 150–400)
RBC: 3.73 MIL/uL — ABNORMAL LOW (ref 4.22–5.81)
RDW: 13.1 % (ref 11.5–15.5)
WBC: 4.2 10*3/uL (ref 4.0–10.5)
nRBC: 0 % (ref 0.0–0.2)

## 2018-11-11 LAB — COMPREHENSIVE METABOLIC PANEL
ALT: 16 U/L (ref 0–44)
AST: 27 U/L (ref 15–41)
Albumin: 3.3 g/dL — ABNORMAL LOW (ref 3.5–5.0)
Alkaline Phosphatase: 60 U/L (ref 38–126)
Anion gap: 6 (ref 5–15)
BUN: 14 mg/dL (ref 8–23)
CALCIUM: 8.7 mg/dL — AB (ref 8.9–10.3)
CO2: 29 mmol/L (ref 22–32)
Chloride: 102 mmol/L (ref 98–111)
Creatinine, Ser: 0.97 mg/dL (ref 0.61–1.24)
GFR calc Af Amer: >60 mL/min (ref >60)
GFR calc non Af Amer: >60 mL/min (ref >60)
Glucose, Bld: 116 mg/dL — ABNORMAL HIGH (ref 70–99)
Potassium: 4.5 mmol/L (ref 3.5–5.1)
Sodium: 137 mmol/L (ref 135–145)
Total Bilirubin: 0.4 mg/dL (ref 0.3–1.2)
Total Protein: 6.1 g/dL — ABNORMAL LOW (ref 6.5–8.1)

## 2018-11-11 LAB — URINALYSIS, ROUTINE W REFLEX MICROSCOPIC
Bacteria, UA: NONE SEEN
Bilirubin Urine: NEGATIVE
Glucose, UA: NEGATIVE mg/dL
Hgb urine dipstick: NEGATIVE
Ketones, ur: NEGATIVE mg/dL
Nitrite: NEGATIVE
PH: 5 (ref 5.0–8.0)
Protein, ur: NEGATIVE mg/dL
Specific Gravity, Urine: 1.017 (ref 1.005–1.030)

## 2018-11-11 LAB — I-STAT CG4 LACTIC ACID, ED: LACTIC ACID, VENOUS: 1.03 mmol/L (ref 0.5–1.9)

## 2018-11-11 NOTE — ED Notes (Signed)
Both set of blood cultures drawn and in main lab.

## 2018-11-11 NOTE — ED Triage Notes (Signed)
Pt presents with redness and swelling to right leg s/p toe amputation last week. Extremity warm to touch, afebrile.

## 2018-11-12 ENCOUNTER — Emergency Department (HOSPITAL_COMMUNITY): Payer: Medicare Other

## 2018-11-12 ENCOUNTER — Emergency Department (EMERGENCY_DEPARTMENT_HOSPITAL)
Admit: 2018-11-12 | Discharge: 2018-11-12 | Disposition: A | Discharge status: Home / Self Care | Payer: Medicare Other | Attending: Emergency Medicine | Admitting: Emergency Medicine

## 2018-11-12 DIAGNOSIS — R609 Edema, unspecified: Secondary | ICD-10-CM

## 2018-11-12 MED ORDER — CEPHALEXIN 500 MG PO CAPS: 500.0000 mg | ORAL_CAPSULE | Freq: Four times a day (QID) | ORAL | 0 refills | Status: DC

## 2018-11-12 MED ORDER — CEPHALEXIN 250 MG PO CAPS
500.0000 mg | ORAL_CAPSULE | Freq: Once | ORAL | Status: AC
  Administered 2018-11-12: 500 mg via ORAL
  Filled 2018-11-12: qty 2

## 2018-11-12 MED ORDER — SULFAMETHOXAZOLE-TRIMETHOPRIM 800-160 MG PO TABS: 1.0000 | ORAL_TABLET | Freq: Two times a day (BID) | ORAL | 0 refills | Status: AC

## 2018-11-12 MED ORDER — VANCOMYCIN HCL 10 G IV SOLR
1250.0000 mg | Freq: Once | INTRAVENOUS | Status: AC
  Administered 2018-11-12: 1250 mg via INTRAVENOUS
  Filled 2018-11-12: qty 1250

## 2018-11-12 NOTE — ED Provider Notes (Signed)
[facility] EMERGENCY DEPARTMENT Provider Note   CSN: [id number] Arrival date & time: 11/11/18  2149     History   Chief Complaint Chief Complaint  Patient presents with  . Wound Infection    HPI Casey Gates is a 84 y.o. male.  HPI  84 year old male with history of CKD, chronic ulcer of right toe status post amputation of the second toe 1 week ago, GIST on Gleevec comes in with chief complaint of swelling and redness in his right leg.  Patient reports that he woke up this morning and noted redness in his right calf.  Over time the redness has gotten worse along with swelling therefore he came to the ER.  He has no history of DVT, PE.  Patient denies any associated nausea, vomiting, fevers, chills.  However patient does indicate that he has history of cellulitis on multiple occasion and he has tendency to easily get infections.  Past Medical History:  Diagnosis Date  . Anemia   . Asthma    AS A CHILD  . Blind left eye   . Cancer [facility])    PROSTATE CANCER  . Cellulitis    frequently, "all over"  . Chronic kidney disease   . Chronic pain syndrome   . Chronic ulcer of toe, right, with unspecified severity ([place])    2nd toe  . Constipation   . DDD (degenerative disc disease), lumbar   . Gastrointestinal stromal tumor (GIST) of stomach ([place])   . GI bleeding   . History of blood transfusion    01/2018  . Insomnia   . Kyphoscoliosis   . Mass in the abdomen 01/2018  . Restless leg   . Sleep apnea    does not wear CPAP  . Wears glasses     Patient Active Problem List   Diagnosis Date Noted  . Chronic ulcer of right foot with fat layer exposed ([place]) 11/05/2018  . Multifocal pneumonia 03/18/2018  . Sepsis ([place]) 03/18/2018  . Malignant gastrointestinal stromal tumor (GIST) of stomach ([place]) 03/12/2018  . Urinary retention 02/24/2018  . Gastrointestinal stromal tumor (GIST) of stomach ([place])   . Restless leg syndrome   . Acute GI bleeding 02/16/2018  .  Acute blood loss anemia 02/16/2018  . Constipation 01/20/2018  . Exertional shortness of breath 12/30/2017  . Rupture of left quadriceps muscle 07/07/2017  . Rupture of left quadriceps tendon 06/26/2017  . Eczema 06/26/2017  . Chest pain 07/29/2015  . OSA on CPAP 07/10/2015  . CPAP use counseling 07/10/2015  . Excessive daytime sleepiness 02/22/2015  . Sleep apnea 02/22/2015  . Snoring 02/22/2015  . Nocturia more than twice per night 02/22/2015  . Syncope 12/31/2012  . Bifascicular bundle branch block 12/31/2012    Class: Acute  . Cellulitis and abscess of leg, except foot 12/04/2012  . Chronic pain 12/04/2012  . Kyphosis 12/04/2012  . H/O unilateral nephrectomy 12/04/2012  . H/O prostate cancer 12/04/2012  . S/P TURP 12/04/2012    Past Surgical History:  Procedure Laterality Date  . AMPUTATION TOE Right 11/05/2018   Procedure: Right 2nd toe ampuation;  Surgeon: Wylene Simmer, MD;  Location: [facility];  Service: Orthopedics;  Laterality: Right;  48min  . CHOLECYSTECTOMY    . COLONOSCOPY    . ESOPHAGOGASTRODUODENOSCOPY (EGD) WITH PROPOFOL Left 02/16/2018   Procedure: ESOPHAGOGASTRODUODENOSCOPY (EGD) WITH PROPOFOL;  Surgeon: [name], MD;  Location: [facility];  Service: Gastroenterology;  Laterality: Left;  . LAPAROSCOPIC GASTRECTOMY N/A 03/12/2018  Procedure: LAPAROSCOPIC ASSISTED PARTIAL GASTRECTOMY ERAS PATHWAY;  Surgeon: Stark Klein, MD;  Location: [facility];  Service: General;  Laterality: N/A;  . LAPAROSCOPIC PARTIAL GASTRECTOMY  03/12/2018   LAPAROSCOPIC ASSISTED PARTIAL GASTRECTOMY ERAS PATHWAY  . PROSTATECTOMY    . QUADRICEPS TENDON REPAIR Left 07/07/2017   Procedure: REPAIR QUADRICEP TENDON;  Surgeon: Rod Can, MD;  Location: [facility];  Service: Orthopedics;  Laterality: Left;  . RIGHT CATARACT EXTRACTION  2013  . RIGTH NEPHRECTOMY  2006  . TONSILLECTOMY    . TRANSURETHRAL RESECTION OF PROSTATE  1997        Home Medications    Prior to  Admission medications   Medication Sig Start Date End Date Taking? Authorizing Provider  ferrous sulfate 325 (65 FE) MG tablet Take 325 mg by mouth daily with breakfast.   Yes [provider]  gabapentin (NEURONTIN) 300 MG capsule Take 1 capsule (300 mg total) by mouth 3 (three) times daily. Patient taking differently: Take 300 mg by mouth See admin instructions. Take 300 mg by mouth two to three times a day 03/24/18  Yes Winfrey, Jenne Pane, MD  imatinib (GLEEVEC) 100 MG tablet TAKE 4 TABLETS (400MG ) BY MOUTH ONCE DAILY ON A FULL STOMACH AND A GLASS OF WATER. Patient taking differently: Take 400 mg by mouth daily.  10/26/18  Yes Truitt Merle, MD  mirtazapine (REMERON) 45 MG tablet Take 45 mg by mouth at bedtime.   Yes [provider]  oxymorphone (OPANA) 10 MG tablet Take 10 mg by mouth every 6 (six) hours as needed for pain.  07/03/18  Yes [provider]  senna-docusate (SENOKOT-S) 8.6-50 MG tablet Take 1 tablet by mouth at bedtime as needed for mild constipation. Patient taking differently: Take 1-4 tablets by mouth at bedtime as needed for mild constipation.  03/24/18  Yes Katherine Roan, MD  cephALEXin (KEFLEX) 500 MG capsule Take 1 capsule (500 mg total) by mouth 4 (four) times daily. 11/12/18   Varney Biles, MD  sulfamethoxazole-trimethoprim (BACTRIM DS,SEPTRA DS) 800-160 MG tablet Take 1 tablet by mouth 2 (two) times daily for 7 days. 11/12/18 11/19/18  Varney Biles, MD    Family History Family History  Problem Relation Age of Onset  . Bone cancer Mother   . Hypertension Mother   . Arthritis Mother   . Clotting disorder Daughter        died of blood clot     Social History Social History   Tobacco Use  . Smoking status: Never Smoker  . Smokeless tobacco: Never Used  Substance Use Topics  . Alcohol use: No    Alcohol/week: 0.0 standard drinks    Comment: quit 1985, 2 fifths/ week,S/P rehab  . Drug use: No     Allergies   Coconut flavor  [flavoring agent]; Coconut oil; and Food   Review of Systems Review of Systems  Constitutional: Positive for activity change.  Gastrointestinal: Negative for nausea and vomiting.  Skin: Positive for rash.  Allergic/Immunologic: Positive for immunocompromised state.  Hematological: Does not bruise/bleed easily.  All other systems reviewed and are negative.    Physical Exam Updated Vital Signs BP (!) 142/71   Pulse 78   Temp 98.6 F (37 C) (Oral)   Resp 18   SpO2 98%   Physical Exam Vitals signs and nursing note reviewed.  Constitutional:      Appearance: He is well-developed.  HENT:     Head: Atraumatic.  Neck:     Musculoskeletal: Neck supple.  Cardiovascular:     Rate and Rhythm: Normal rate.  Pulmonary:     Effort: Pulmonary effort is normal.  Musculoskeletal:        General: Swelling and tenderness present.     Comments: Patient has right lower extremity edema, erythema and calor over the entire leg. There is tenderness to palpation over the calf region.  At the actual surgical wound site, there is no evidence of erythema or drainage.  The entire foot is not erythematous either.  Skin:    General: Skin is warm.     Findings: Rash present.  Neurological:     Mental Status: He is alert and oriented to person, place, and time.      ED Treatments / Results  Labs (all labs ordered are listed, but only abnormal results are displayed) Labs Reviewed  COMPREHENSIVE METABOLIC PANEL - Abnormal; Notable for the following components:      Result Value   Glucose, Bld 116 (*)    Calcium 8.7 (*)    Total Protein 6.1 (*)    Albumin 3.3 (*)    All other components within normal limits  CBC WITH DIFFERENTIAL/PLATELET - Abnormal; Notable for the following components:   RBC 3.73 (*)    Hemoglobin 11.8 (*)    HCT 37.5 (*)    MCV 100.5 (*)    All other components within normal limits  URINALYSIS, ROUTINE W REFLEX MICROSCOPIC - Abnormal; Notable for the following  components:   Leukocytes, UA SMALL (*)    All other components within normal limits  I-STAT CG4 LACTIC ACID, ED  I-STAT CG4 LACTIC ACID, ED    EKG None  Radiology Dg Tibia/fibula Right  Result Date: 11/12/2018 CLINICAL DATA:  84 year old male with redness and swelling of the right leg status post toe amputation. Evaluate for soft tissue gas. EXAM: RIGHT TIBIA AND FIBULA - 2 VIEW COMPARISON:  Right foot radiograph dated 10/11/2018 FINDINGS: There is no acute fracture or dislocation. The bones are mildly osteopenic. The ankle mortise is intact. There is mild soft tissue swelling around the ankle. No radiopaque foreign object or soft tissue gas. If there is clinical concern for fracture, dedicated ankle radiograph may provide better evaluation. IMPRESSION: 1. No acute fracture or dislocation. 2. Soft tissue swelling of the ankle.  No soft tissue gas. Electronically Signed   By: Anner Crete M.D.   On: 11/12/2018 02:25    Procedures Procedures (including critical care time)  Medications Ordered in ED Medications  vancomycin (VANCOCIN) 1,250 mg in sodium chloride 0.9 % 250 mL IVPB (1,250 mg Intravenous Started During Downtime 11/12/18 0300)  cephALEXin (KEFLEX) capsule 500 mg (500 mg Oral Given 11/12/18 0054)     Initial Impression / Assessment and Plan / ED Course  I have reviewed the triage vital signs and the nursing notes.  Pertinent labs & imaging results that were available during my care of the patient were reviewed by me and considered in my medical decision making (see chart for details).     84 year old on [place] because of guest, recent right second toe amputation and history of multiple cellulitis comes in with chief complaint of leg pain and swelling. Once the dressing was taken off, it became clear that the surgical wound site actually looks clean. Differential diagnosis essentially cellulitis versus DVT. Plan is to give patient IV antibiotics here and get the  ultrasound to rule out DVT.  His ultrasound will be done in the morning and his care will be signed  out to the incoming team.  Final Clinical Impressions(s) / ED Diagnoses   Final diagnoses:  Cellulitis of right lower extremity    ED Discharge Orders         Ordered    LE VENOUS     11/12/18 0048    cephALEXin (KEFLEX) 500 MG capsule  4 times daily     11/12/18 0057    sulfamethoxazole-trimethoprim (BACTRIM DS,SEPTRA DS) 800-160 MG tablet  2 times daily     11/12/18 0057           Varney Biles, MD 11/12/18 [phone]

## 2018-11-12 NOTE — ED Notes (Signed)
Pt and family voice understanding of discharge instructions. Transporting home with POA.

## 2018-11-12 NOTE — Progress Notes (Signed)
RLE venous duplex       has been completed. Preliminary results can be found under CV proc through chart review. Draden Cottingham Eunice, RDMS, RVT   

## 2018-11-12 NOTE — Discharge Instructions (Addendum)
We saw in the ER for redness in your leg. It appears that the symptoms are likely infection.  We gave you antibiotics while you are in the ER.   An ultrasound was done on your leg and did not show any blood clot in the leg.  Follow-up with your doctor as soon as possible for recheck on your leg.  Return to the emergency department if your symptoms are worsening.

## 2018-11-16 DIAGNOSIS — Z89429 Acquired absence of other toe(s), unspecified side: Secondary | ICD-10-CM | POA: Insufficient documentation

## 2018-11-18 ENCOUNTER — Other Ambulatory Visit: Payer: Self-pay | Admitting: Hematology

## 2018-11-18 ENCOUNTER — Ambulatory Visit (HOSPITAL_COMMUNITY)
Admission: RE | Admit: 2018-11-18 | Discharge: 2018-11-18 | Disposition: A | Discharge status: Home / Self Care | Payer: Medicare Other | Source: Ambulatory Visit | Attending: Hematology | Admitting: Hematology

## 2018-11-18 ENCOUNTER — Inpatient Hospital Stay: Payer: Medicare Other | Attending: Oncology

## 2018-11-18 DIAGNOSIS — Z79899 Other long term (current) drug therapy: Secondary | ICD-10-CM | POA: Diagnosis not present

## 2018-11-18 DIAGNOSIS — Z905 Acquired absence of kidney: Secondary | ICD-10-CM | POA: Insufficient documentation

## 2018-11-18 DIAGNOSIS — C49A2 Gastrointestinal stromal tumor of stomach: Secondary | ICD-10-CM

## 2018-11-18 DIAGNOSIS — Z9221 Personal history of antineoplastic chemotherapy: Secondary | ICD-10-CM | POA: Insufficient documentation

## 2018-11-18 DIAGNOSIS — Z89421 Acquired absence of other right toe(s): Secondary | ICD-10-CM | POA: Insufficient documentation

## 2018-11-18 DIAGNOSIS — G894 Chronic pain syndrome: Secondary | ICD-10-CM | POA: Insufficient documentation

## 2018-11-18 DIAGNOSIS — Z8546 Personal history of malignant neoplasm of prostate: Secondary | ICD-10-CM | POA: Diagnosis not present

## 2018-11-18 DIAGNOSIS — Z79891 Long term (current) use of opiate analgesic: Secondary | ICD-10-CM | POA: Diagnosis not present

## 2018-11-18 DIAGNOSIS — D509 Iron deficiency anemia, unspecified: Secondary | ICD-10-CM | POA: Diagnosis not present

## 2018-11-18 DIAGNOSIS — M549 Dorsalgia, unspecified: Secondary | ICD-10-CM | POA: Insufficient documentation

## 2018-11-18 LAB — CMP (CANCER CENTER ONLY)
ALBUMIN: 3.9 g/dL (ref 3.5–5.0)
ALT: 25 U/L (ref 0–44)
AST: 36 U/L (ref 15–41)
Alkaline Phosphatase: 76 U/L (ref 38–126)
Anion gap: 6 (ref 5–15)
BUN: 15 mg/dL (ref 8–23)
CO2: 27 mmol/L (ref 22–32)
CREATININE: 0.97 mg/dL (ref 0.61–1.24)
Calcium: 9 mg/dL (ref 8.9–10.3)
Chloride: 102 mmol/L (ref 98–111)
GFR, Est AFR Am: >60 mL/min (ref >60)
GFR, Estimated: >60 mL/min (ref >60)
GLUCOSE: 81 mg/dL (ref 70–99)
Potassium: 4.7 mmol/L (ref 3.5–5.1)
Sodium: 135 mmol/L (ref 135–145)
Total Bilirubin: 0.5 mg/dL (ref 0.3–1.2)
Total Protein: 6.6 g/dL (ref 6.5–8.1)

## 2018-11-18 LAB — CBC WITH DIFFERENTIAL (CANCER CENTER ONLY)
Abs Immature Granulocytes: 0 10*3/uL (ref 0.00–0.07)
Basophils Absolute: 0 10*3/uL (ref 0.0–0.1)
Basophils Relative: 1 %
EOS PCT: 12 %
Eosinophils Absolute: 0.5 10*3/uL (ref 0.0–0.5)
HCT: 38.1 % — ABNORMAL LOW (ref 39.0–52.0)
Hemoglobin: 12.3 g/dL — ABNORMAL LOW (ref 13.0–17.0)
Immature Granulocytes: 0 %
Lymphocytes Relative: 33 %
Lymphs Abs: 1.5 10*3/uL (ref 0.7–4.0)
MCH: 31.9 pg (ref 26.0–34.0)
MCHC: 32.3 g/dL (ref 30.0–36.0)
MCV: 98.7 fL (ref 80.0–100.0)
Monocytes Absolute: 0.5 10*3/uL (ref 0.1–1.0)
Monocytes Relative: 12 %
Neutro Abs: 2 10*3/uL (ref 1.7–7.7)
Neutrophils Relative %: 42 %
Platelet Count: 194 10*3/uL (ref 150–400)
RBC: 3.86 MIL/uL — ABNORMAL LOW (ref 4.22–5.81)
RDW: 13.3 % (ref 11.5–15.5)
WBC Count: 4.6 10*3/uL (ref 4.0–10.5)
nRBC: 0 % (ref 0.0–0.2)

## 2018-11-18 LAB — FERRITIN: Ferritin: 80 ng/mL (ref 24–336)

## 2018-11-18 LAB — IRON AND TIBC
Iron: 119 ug/dL (ref 42–163)
Saturation Ratios: 49 % (ref 20–55)
TIBC: 244 ug/dL (ref 202–409)
UIBC: 125 ug/dL (ref 117–376)

## 2018-11-18 MED ORDER — IOHEXOL 300 MG/ML  SOLN
100.0000 mL | Freq: Once | INTRAMUSCULAR | Status: AC | PRN
  Administered 2018-11-18: 100 mL via INTRAVENOUS

## 2018-11-18 MED ORDER — SODIUM CHLORIDE (PF) 0.9 % IJ SOLN
INTRAMUSCULAR | Status: AC
  Filled 2018-11-18: qty 50

## 2018-11-20 ENCOUNTER — Inpatient Hospital Stay (HOSPITAL_BASED_OUTPATIENT_CLINIC_OR_DEPARTMENT_OTHER): Payer: Medicare Other | Admitting: Hematology

## 2018-11-20 ENCOUNTER — Telehealth: Payer: Self-pay | Admitting: Hematology

## 2018-11-20 ENCOUNTER — Telehealth: Payer: Self-pay

## 2018-11-20 ENCOUNTER — Encounter: Payer: Self-pay | Admitting: Hematology

## 2018-11-20 VITALS — BP 122/63 | HR 74 | Temp 98.5°F | Resp 18 | Ht 71.0 in | Wt 147.1 lb

## 2018-11-20 DIAGNOSIS — M549 Dorsalgia, unspecified: Secondary | ICD-10-CM

## 2018-11-20 DIAGNOSIS — C49A2 Gastrointestinal stromal tumor of stomach: Secondary | ICD-10-CM

## 2018-11-20 DIAGNOSIS — Z9221 Personal history of antineoplastic chemotherapy: Secondary | ICD-10-CM | POA: Diagnosis not present

## 2018-11-20 DIAGNOSIS — G894 Chronic pain syndrome: Secondary | ICD-10-CM

## 2018-11-20 DIAGNOSIS — Z8546 Personal history of malignant neoplasm of prostate: Secondary | ICD-10-CM

## 2018-11-20 DIAGNOSIS — Z89421 Acquired absence of other right toe(s): Secondary | ICD-10-CM

## 2018-11-20 DIAGNOSIS — Z79899 Other long term (current) drug therapy: Secondary | ICD-10-CM

## 2018-11-20 DIAGNOSIS — D509 Iron deficiency anemia, unspecified: Secondary | ICD-10-CM | POA: Diagnosis not present

## 2018-11-20 DIAGNOSIS — Z79891 Long term (current) use of opiate analgesic: Secondary | ICD-10-CM

## 2018-11-20 DIAGNOSIS — Z905 Acquired absence of kidney: Secondary | ICD-10-CM

## 2018-11-20 NOTE — Progress Notes (Signed)
[place]   [phone] [phone]   Clinic Follow up Note   Patient Care Team: Velna Hatchet, MD as PCP - General (Internal Medicine) [name], MD as PCP - Cardiology (Cardiology)  Date of Service:  11/20/2018  CHIEF COMPLAINT: F/u of GIST  SUMMARY OF ONCOLOGIC HISTORY: Oncology History   Cancer Staging Malignant gastrointestinal stromal tumor (GIST) of stomach ([place]y Gates) Staging form: Gastrointestinal Stromal Tumor - Gastric and Omental GIST, AJCC 8th Edition - Pathologic stage from 04/22/2018: Stage IIIB (pT4, pN0, cM0, Mitotic Rate: High) - Signed by Alla Feeling, NP on 04/22/2018       Malignant gastrointestinal stromal tumor (GIST) of stomach (Casey Gates)   02/16/2018 Procedure    Upper EGD per Dr. [name] Impression:  - Normal upper third of esophagus and middle third of esophagus. - 5 cm hiatal hernia. - Widely patent Schatzki ring. - Clotted blood in the gastric body. - Rule out malignancy, gastric tumor on the lesser curvature of the stomach. Injected. Clips were placed. - Normal examined duodenum. - No specimens collected.    02/17/2018 Imaging    CT AP IMPRESSION: 1. 9.7 x 8.8 x 10.9 cm aggressive appearing heterogeneously enhancing mass along the lesser curvature of the stomach, highly concerning for primary gastric neoplasm. This is intimately associated with the undersurface of the left lobe of the liver without definitive evidence of direct invasion (although superficial involvement of the hepatic capsule is suspected given the loss of the intervening fat plane). There is also an indeterminate left adrenal nodule measuring 2.0 x 1.4 cm, which could be metastatic. No other definite signs of metastatic disease elsewhere in the abdomen or pelvis. 2. Areas of cylindrical bronchiectasis and chronic scarring in the lower lobes of the lungs bilaterally. 3. Aortic atherosclerosis, in addition to left main and 3 vessel coronary  artery disease. 4. Right kidney is either surgically or congenitally absent, or severely atrophic. 5. Additional incidental findings, as above.    02/19/2018 Initial Biopsy    VIA CT BIOPSY: Diagnosis -  Soft Tissue Needle Core Biopsy, gastric - FINDINGS CONSISTENT WITH GASTROINTESTINAL STROMAL TUMOR (GIST). - SEE COMMENT.    02/19/2018 Pathology Results    Diagnosis 02/19/2018 Soft Tissue Needle Core Biopsy, gastric - FINDINGS CONSISTENT WITH GASTROINTESTINAL STROMAL TUMOR (GIST).    03/12/2018 Initial Diagnosis    Malignant gastrointestinal stromal tumor (GIST) of stomach (Casey Gates)    03/12/2018 Pathology Results    Surgical path: Diagnosis Stomach, resection for tumor, Lesser curvature - GASTROINTESTINAL STROMAL TUMOR, 13.8 CM.    03/18/2018 Imaging    CT CAP IMPRESSION: -obtained d/t post op fever, leukocytosis, and respiratory change   Moderate right pleural effusion and small left pleural effusion. Compressive atelectasis versus pneumonia in the lower lobes. Nodular airspace disease throughout much of the right upper lobe and right middle lobe concerning for pneumonia.  Cardiomegaly. Severe/diffuse coronary artery disease. Aortic atherosclerosis. No evidence of aortic aneurysm.  Postoperative changes from recent partial gastrectomy. Small amount of free air in the abdomen, likely related to post op state. No evidence of bowel obstruction.  Prior remote right nephrectomy and prostatectomy.    03/22/2018 Procedure    Thoracentesis  Diagnosis PLEURAL FLUID, RIGHT (SPECIMEN 1 OF 1, COLLECTED ON 03/22/2018): NO MALIGNANT CELLS IDENTIFIED.      04/22/2018 Cancer Staging    Staging form: Gastrointestinal Stromal Tumor - Gastric and Omental GIST, AJCC 8th Edition - Pathologic stage from 04/22/2018: Stage IIIB (pT4, pN0, cM0, Mitotic Rate: High) -  Signed by Alla Feeling, NP on 04/22/2018    05/03/2018 Imaging    05/03/2018 CT Abdomen and Pelvis W/ Contrast IMPRESSION: CTA  chest:  1. No pulmonary embolus. 2. Debris within the right lower lobe bronchus with associated right lower lobe opacity, this can be seen with aspiration. 3. Improved right upper lobe aeration from prior consistent with resolving pneumonia. Near completely resolved right pleural effusion. 4. Chronic bilateral lower lobe bronchiectasis. Bilateral lower lobe airspace disease with some improvement from most recent prior. 5. Coronary artery calcifications. Aortic Atherosclerosis (ICD10-I70.0).  CT abdomen/pelvis:  1. Large colonic stool burden, suggesting constipation. No bowel obstruction. 2. Post recent partial gastrectomy without evident complication. No intra-abdominal abscess. 3. Additional stable chronic findings as described. Aortic Atherosclerosis (ICD10-I70.0).    05/03/2018 Imaging    05/03/2018 CT Angio Chest PE W or WO Contrast IMPRESSION: CTA chest:  1. No pulmonary embolus. 2. Debris within the right lower lobe bronchus with associated right lower lobe opacity, this can be seen with aspiration. 3. Improved right upper lobe aeration from prior consistent with resolving pneumonia. Near completely resolved right pleural effusion. 4. Chronic bilateral lower lobe bronchiectasis. Bilateral lower lobe airspace disease with some improvement from most recent prior. 5. Coronary artery calcifications. Aortic Atherosclerosis (ICD10-I70.0).    05/03/2018 Imaging    05/03/2018 CT CAP IMPRESSION: CTA chest: 1. No pulmonary embolus. 2. Debris within the right lower lobe bronchus with associated right lower lobe opacity, this can be seen with aspiration. 3. Improved right upper lobe aeration from prior consistent with resolving pneumonia. Near completely resolved right pleural effusion. 4. Chronic bilateral lower lobe bronchiectasis. Bilateral lower lobe airspace disease with some improvement from most recent prior. 5. Coronary artery calcifications. Aortic  Atherosclerosis (ICD10-I70.0).  CT abdomen/pelvis: 1. Large colonic stool burden, suggesting constipation. No bowel obstruction. 2. Post recent partial gastrectomy without evident complication. No intra-abdominal abscess. 3. Additional stable chronic findings as described. Aortic Atherosclerosis (ICD10-I70.0).     05/26/2018 -  Chemotherapy    Adjuvant Gleevec, starting on 05/26/18 at 265m daily and gradually increased to 4012mdaily since 06/23/2018    11/19/2018 Imaging    CT CAP  IMPRESSION: 1. Stable exam. No new or progressive interval findings. No features to suggest metastatic disease in the chest, abdomen, or pelvis. 2. Bronchiectasis/scarring in the lung bases, similar to prior. 3. Stable small left adrenal nodule. 4.  Aortic Atherosclerois (ICD10-170.0)      CURRENT THERAPY:  Adjuvant Gleevec, starting on 05/26/18 at 20020maily and gradually increased to 400m72mily since 06/23/2018  INTERVAL HISTORY:  [place]elford Wingerthere for a follow up of GIST. Since last visit he has been to the ED for cellulitis of his right foot and had a right 2nd toe amputation on 11/05/18 followed by a right leg infection. He was treated with 2 antibiotics which he completed this yesterday. His toe stiches were removed today.  He presents to the clinic today with his son. He notes still having skin erythema of right lower leg. He notes going from diarrhea to constipation but mostly constipation. He uses Senna for this but has recently not been working well as he is not sure how much to take. He has not used Lynzest yet. He notes he has increased fluid intake but does not have a good appetite. He has been drinking boost daily and intakes 2 meals a day.     REVIEW OF SYSTEMS:   Constitutional: Denies fevers, chills or abnormal weight loss (+)  low appetite  Eyes: Denies blurriness of vision Ears, nose, mouth, throat, and face: Denies mucositis or sore throat Respiratory: Denies cough, dyspnea or  wheezes Cardiovascular: Denies palpitation, chest discomfort or lower extremity swelling MSK: (+) s/p right 2nd toe amputation Gastrointestinal:  Denies nausea, heartburn (+) constipation > Diarrhea  Skin: Denies abnormal skin rashes (+) right lower leg skin erythema Lymphatics: Denies new lymphadenopathy or easy bruising Neurological:Denies numbness, tingling or new weaknesses Behavioral/Psych: Mood is stable, no new changes  All other systems were reviewed with the patient and are negative.  MEDICAL HISTORY:  Past Medical History:  Diagnosis Date  . Anemia   . Asthma    AS A CHILD  . Blind left eye   . Cancer [facility])    PROSTATE CANCER  . Cellulitis    frequently, "all over"  . Chronic kidney disease   . Chronic pain syndrome   . Chronic ulcer of toe, right, with unspecified severity ([place])    2nd toe  . Constipation   . DDD (degenerative disc disease), lumbar   . Gastrointestinal stromal tumor (GIST) of stomach ([place])   . GI bleeding   . History of blood transfusion    01/2018  . Insomnia   . Kyphoscoliosis   . Mass in the abdomen 01/2018  . Restless leg   . Sleep apnea    does not wear CPAP  . Wears glasses     SURGICAL HISTORY: Past Surgical History:  Procedure Laterality Date  . AMPUTATION TOE Right 11/05/2018   Procedure: Right 2nd toe ampuation;  Surgeon: Wylene Simmer, MD;  Location: [facility];  Service: Orthopedics;  Laterality: Right;  14mn  . CHOLECYSTECTOMY    . COLONOSCOPY    . ESOPHAGOGASTRODUODENOSCOPY (EGD) WITH PROPOFOL Left 02/16/2018   Procedure: ESOPHAGOGASTRODUODENOSCOPY (EGD) WITH PROPOFOL;  Surgeon: KRonnette Juniper MD;  [place] [facility]  Service: Gastroenterology;  Laterality: Left;  . LAPAROSCOPIC GASTRECTOMY N/A 03/12/2018   Procedure: LAPAROSCOPIC ASSISTED PARTIAL GASTRECTOMY ERAS PATHWAY;  Surgeon: BStark Klein MD;  [place] [facility]  Service: General;  Laterality: N/A;  . LAPAROSCOPIC PARTIAL GASTRECTOMY  03/12/2018    LAPAROSCOPIC ASSISTED PARTIAL GASTRECTOMY ERAS PATHWAY  . PROSTATECTOMY    . QUADRICEPS TENDON REPAIR Left 07/07/2017   Procedure: REPAIR QUADRICEP TENDON;  Surgeon: SRod Can MD;  [place] [facility]  Service: Orthopedics;  Laterality: Left;  . RIGHT CATARACT EXTRACTION  2013  . RIGTH NEPHRECTOMY  2006  . TONSILLECTOMY    . TRANSURETHRAL RESECTION OF PROSTATE  1997    I have reviewed the social history and family history with the patient and they are unchanged from previous note.  ALLERGIES:  is allergic to coconut flavor [flavoring agent]; coconut oil; and food.  MEDICATIONS:  Current Outpatient Medications  Medication Sig Dispense Refill  . ferrous sulfate 325 (65 FE) MG tablet Take 325 mg by mouth daily with breakfast.    . gabapentin (NEURONTIN) 300 MG capsule Take 1 capsule (300 mg total) by mouth 3 (three) times daily. (Patient taking differently: Take 300 mg by mouth See admin instructions. Take 300 mg by mouth two to three times a day)    . imatinib (GLEEVEC) 100 MG tablet TAKE 4 TABLETS (400MG) BY MOUTH ONCE DAILY ON A FULL STOMACH AND A GLASS OF WATER. 120 tablet 0  . mirtazapine (REMERON) 45 MG tablet Take 45 mg by mouth at bedtime.    .Marland Kitchenoxymorphone (OPANA) 10 MG tablet Take 10 mg by mouth every 6 (six) hours  as needed for pain.     Marland Kitchen senna-docusate (SENOKOT-S) 8.6-50 MG tablet Take 1 tablet by mouth at bedtime as needed for mild constipation. (Patient taking differently: Take 1-4 tablets by mouth at bedtime as needed for mild constipation. )     No current facility-administered medications for this visit.     PHYSICAL EXAMINATION: ECOG PERFORMANCE STATUS: 3 - Symptomatic, >50% confined to bed  Vitals:   11/20/18 1015  BP: 122/63  Pulse: 74  Resp: 18  Temp: 98.5 F (36.9 C)  SpO2: 96%   Filed Weights   11/20/18 1015  Weight: 147 lb 1.6 oz (66.7 kg)    GENERAL:alert, no distress and comfortable SKIN: skin color, texture, turgor are normal, no rashes or  significant lesions (+) right lower leg skin erythema  EYES: normal, Conjunctiva are pink and non-injected, sclera clear OROPHARYNX:no exudate, no erythema and lips, buccal mucosa, and tongue normal  NECK: supple, thyroid normal size, non-tender, without nodularity LYMPH:  no palpable lymphadenopathy in the cervical, axillary or inguinal LUNGS: clear to auscultation and percussion with normal breathing effort HEART: regular rate & rhythm and no murmurs and no lower extremity edema ABDOMEN:abdomen soft, non-tender and normal bowel sounds Musculoskeletal:no cyanosis of digits and no clubbing (+)s/p right 2nd toe amputation, presented in wheelchair  NEURO: alert & oriented x 3 with fluent speech, no focal motor/sensory deficits  LABORATORY DATA:  I have reviewed the data as listed CBC Latest Ref Rng & Units 11/18/2018 11/11/2018 10/11/2018  WBC 4.0 - 10.5 K/uL 4.6 4.2 3.6(L)  Hemoglobin 13.0 - 17.0 g/dL 12.3(L) 11.8(L) 11.6(L)  Hematocrit 39.0 - 52.0 % 38.1(L) 37.5(L) 37.1(L)  Platelets 150 - 400 K/uL 194 171 132(L)     CMP Latest Ref Rng & Units 11/18/2018 11/11/2018 10/11/2018  Glucose 70 - 99 mg/dL 81 116(H) 107(H)  BUN 8 - 23 mg/dL _0 Creatinine 0.61 - 1.24 mg/dL 0.97 0.97 1.05  Sodium 135 - 145 mmol/L 135 137 137  Potassium 3.5 - 5.1 mmol/L 4.7 4.5 4.0  Chloride 98 - 111 mmol/L 102 102 105  CO2 22 - 32 mmol/L _1 Calcium 8.9 - 10.3 mg/dL 9.0 8.7(L) 8.7(L)  Total Protein 6.5 - 8.1 g/dL 6.6 6.1(L) -  Total Bilirubin 0.3 - 1.2 mg/dL 0.5 0.4 -  Alkaline Phos 38 - 126 U/L 76 60 -  AST 15 - 41 U/L 36 27 -  ALT 0 - 44 U/L 25 16 -      RADIOGRAPHIC STUDIES: I have personally reviewed the radiological images as listed and agreed with the findings in the report. Ct Chest W Contrast  Result Date: 11/19/2018 CLINICAL DATA:  GI stromal tumor of lesser curvature. EXAM: CT CHEST, ABDOMEN, AND PELVIS WITH CONTRAST TECHNIQUE: Multidetector CT imaging of the chest, abdomen  and pelvis was performed following the standard protocol during bolus administration of intravenous contrast. CONTRAST:  146m OMNIPAQUE IOHEXOL 300 MG/ML  SOLN COMPARISON:  05/03/2018 FINDINGS: CT CHEST FINDINGS Cardiovascular: Heart size upper normal. Coronary artery calcification is evident. Atherosclerotic calcification is noted in the wall of the thoracic aorta. Mediastinum/Nodes: No mediastinal lymphadenopathy. There is no hilar lymphadenopathy. The esophagus has normal imaging features. There is no axillary lymphadenopathy. Lungs/Pleura: The central tracheobronchial airways are patent. Bronchiectasis and scarring noted in the lung bases bilaterally. No suspicious pulmonary nodule or mass. No pleural effusion. Musculoskeletal: No worrisome lytic or sclerotic osseous abnormality. CT ABDOMEN PELVIS FINDINGS Hepatobiliary: No focal abnormality within the liver parenchyma. Gallbladder  surgically absent. No intrahepatic or extrahepatic biliary dilation. Pancreas: No focal mass lesion. No dilatation of the main duct. No intraparenchymal cyst. No peripancreatic edema. Spleen: Small hypoattenuating lesion in the spleen is stable in the interval and likely a cyst or pseudocyst. Adrenals/Urinary Tract: 16 mm left adrenal nodule is similar. Thickening of the right adrenal gland evident without mass lesion. Right kidney surgically absent. Lower pole cyst in the left kidney is similar to prior. Bladder decompressed. Stomach/Bowel: Surgical changes noted in the stomach as before. Duodenum is normally positioned as is the ligament of Treitz. No small bowel wall thickening. No small bowel dilatation. The terminal ileum is normal. No gross colonic mass. No colonic wall thickening. Vascular/Lymphatic: There is abdominal aortic atherosclerosis without aneurysm. There is no gastrohepatic or hepatoduodenal ligament lymphadenopathy. No intraperitoneal or retroperitoneal lymphadenopathy. No pelvic sidewall lymphadenopathy.  Reproductive: Prostate surgically absent. Other: No intraperitoneal free fluid. Musculoskeletal: No worrisome lytic or sclerotic osseous abnormality. Thoracolumbar scoliosis degenerative changes in the lumbar spine. IMPRESSION: 1. Stable exam. No new or progressive interval findings. No features to suggest metastatic disease in the chest, abdomen, or pelvis. 2. Bronchiectasis/scarring in the lung bases, similar to prior. 3. Stable small left adrenal nodule. 4.  Aortic Atherosclerois (ICD10-170.0) Electronically Signed   By: Misty Stanley M.D.   On: 11/19/2018 10:14   Ct Abdomen Pelvis W Contrast  Result Date: 11/19/2018 CLINICAL DATA:  GI stromal tumor of lesser curvature. EXAM: CT CHEST, ABDOMEN, AND PELVIS WITH CONTRAST TECHNIQUE: Multidetector CT imaging of the chest, abdomen and pelvis was performed following the standard protocol during bolus administration of intravenous contrast. CONTRAST:  163m OMNIPAQUE IOHEXOL 300 MG/ML  SOLN COMPARISON:  05/03/2018 FINDINGS: CT CHEST FINDINGS Cardiovascular: Heart size upper normal. Coronary artery calcification is evident. Atherosclerotic calcification is noted in the wall of the thoracic aorta. Mediastinum/Nodes: No mediastinal lymphadenopathy. There is no hilar lymphadenopathy. The esophagus has normal imaging features. There is no axillary lymphadenopathy. Lungs/Pleura: The central tracheobronchial airways are patent. Bronchiectasis and scarring noted in the lung bases bilaterally. No suspicious pulmonary nodule or mass. No pleural effusion. Musculoskeletal: No worrisome lytic or sclerotic osseous abnormality. CT ABDOMEN PELVIS FINDINGS Hepatobiliary: No focal abnormality within the liver parenchyma. Gallbladder surgically absent. No intrahepatic or extrahepatic biliary dilation. Pancreas: No focal mass lesion. No dilatation of the main duct. No intraparenchymal cyst. No peripancreatic edema. Spleen: Small hypoattenuating lesion in the spleen is stable in the  interval and likely a cyst or pseudocyst. Adrenals/Urinary Tract: 16 mm left adrenal nodule is similar. Thickening of the right adrenal gland evident without mass lesion. Right kidney surgically absent. Lower pole cyst in the left kidney is similar to prior. Bladder decompressed. Stomach/Bowel: Surgical changes noted in the stomach as before. Duodenum is normally positioned as is the ligament of Treitz. No small bowel wall thickening. No small bowel dilatation. The terminal ileum is normal. No gross colonic mass. No colonic wall thickening. Vascular/Lymphatic: There is abdominal aortic atherosclerosis without aneurysm. There is no gastrohepatic or hepatoduodenal ligament lymphadenopathy. No intraperitoneal or retroperitoneal lymphadenopathy. No pelvic sidewall lymphadenopathy. Reproductive: Prostate surgically absent. Other: No intraperitoneal free fluid. Musculoskeletal: No worrisome lytic or sclerotic osseous abnormality. Thoracolumbar scoliosis degenerative changes in the lumbar spine. IMPRESSION: 1. Stable exam. No new or progressive interval findings. No features to suggest metastatic disease in the chest, abdomen, or pelvis. 2. Bronchiectasis/scarring in the lung bases, similar to prior. 3. Stable small left adrenal nodule. 4.  Aortic Atherosclerois (ICD10-170.0) Electronically Signed   By: ERandall Hiss  Tery Sanfilippo M.D.   On: 11/19/2018 10:14     ASSESSMENT & PLAN:  Casey Gates is a 84 y.o. male with    1. Gastrointestinal Stromal Tumor (GIST) of the lesser curvature of the stomach, 13.8 cm, pT4pNX, G2, mitotic rate >20/50 high power field, stage IIIB -He was diagnosed in 01/2018. He is s/p tumor resection. Pathology revealed a large 13.8 cm GIST in the stomach. IHC was performed and the tumor is positive with CD117 (C-Kit). Genomic testing showed Kit Exon 11 mutation/insertion, which predicts benefit of Gleevec  -He has been on Gleevec since 05/26/18, currently at 456m daily. Plan for 3 years. He is tolerating  well  -Due to her advanced age, comorbidities, and limited performance status, if he does have significant side effects from GDix I have low threshold to stop it. -His CT CAP from 11/19/18 shows no evidence of recurrence.  I reviewed the image myself, and discussed with patient and his son. Will continue Gleevec for total of 3 years.  -F/u in 3 months   2. Anemia, iron deficient  -He has been treated with blood transfusion  -Currently on oral iron therapy 1 tab daily beginning in 04/2018, continue daily.  -Labs reveiwed, his iron deficiency resolved, Hg at 12.3 (11/18/18), improved    3. Chronic constipation, intermittent diarrhea  -He is on chronic opioid use with oxymorphone for chronic back pain, this is contributing to constipation.   -He gives himself enemas weekly if no bowel movement in 3 days.  -He can take Senna pills up to 5 times a day. If he develops diarrhea he can switch to stool softener such as colace.  -His PCP prescribed him Lynzest, I encouraged him to try it.     4. Low appetite  -He remains to have low appetite but overall stable weight.  -I encouraged him to increase his nutritional supplement to BID and continue Mirtazapine.   5. S/p right 2nd toe amputation, right lower leg infection  -He completed antibiotics this week -right leg erythema still present -Will monitor. F/u with PCP    PLAN: -Continue Gleevec 400 mg daily, he is tolerating well.   -Lab and f/u in 3 months    No problem-specific Assessment & Plan notes found for this encounter.   No orders of the defined types were placed in this encounter.  All questions were answered. The patient knows to call the clinic with any problems, questions or concerns. No barriers to learning was detected. I spent 20 minutes counseling the patient face to face. The total time spent in the appointment was 25 minutes and more than 50% was on counseling and review of test results     YTruitt Merle  MD 11/20/2018   I, AJoslyn Devon am acting as scribe for YTruitt Merle MD.   I have reviewed the above documentation for accuracy and completeness, and I agree with the above.

## 2018-11-20 NOTE — Telephone Encounter (Signed)
Printed avs and calender of upcoming appointment. Per 12/20 los 

## 2018-11-20 NOTE — Telephone Encounter (Signed)
Spoke with patient son, about appointments for March per 12/20 los.

## 2018-11-23 MED FILL — IMATINIB MESYLATE 100 MG TA: 100 | 30 days supply | Qty: 120 | Fill #0

## 2018-12-14 ENCOUNTER — Ambulatory Visit: Payer: Medicare Other | Admitting: Podiatry

## 2018-12-14 DIAGNOSIS — G8929 Other chronic pain: Secondary | ICD-10-CM | POA: Insufficient documentation

## 2018-12-14 DIAGNOSIS — C61 Malignant neoplasm of prostate: Secondary | ICD-10-CM | POA: Insufficient documentation

## 2018-12-14 DIAGNOSIS — B351 Tinea unguium: Secondary | ICD-10-CM

## 2018-12-14 DIAGNOSIS — M79676 Pain in unspecified toe(s): Secondary | ICD-10-CM | POA: Diagnosis not present

## 2018-12-14 DIAGNOSIS — M549 Dorsalgia, unspecified: Secondary | ICD-10-CM

## 2018-12-14 DIAGNOSIS — R609 Edema, unspecified: Secondary | ICD-10-CM | POA: Insufficient documentation

## 2018-12-16 NOTE — Progress Notes (Signed)
   Subjective: 85 year old male presenting today with a chief complaint of elongated, thickened toenails of the bilateral feet. He is unable to trim his own nails. Patient is here for further evaluation and treatment.   Past Medical History:  Diagnosis Date  . Anemia   . Asthma    AS A CHILD  . Blind left eye   . Cancer [facility])    PROSTATE CANCER  . Cellulitis    frequently, "all over"  . Chronic kidney disease   . Chronic pain syndrome   . Chronic ulcer of toe, right, with unspecified severity ([place])    2nd toe  . Constipation   . DDD (degenerative disc disease), lumbar   . Gastrointestinal stromal tumor (GIST) of stomach ([place])   . GI bleeding   . History of blood transfusion    01/2018  . Insomnia   . Kyphoscoliosis   . Mass in the abdomen 01/2018  . Restless leg   . Sleep apnea    does not wear CPAP  . Wears glasses     Objective:  General: Well developed, nourished, in no acute distress, alert and oriented x3   Dermatology: Hyperkeratotic, discolored, thickened, onychodystrophy of the bilateral great toenails. Nails are tender, long, thickened and dystrophic with subungual debris, consistent with onychomycosis, 1-5 bilateral. No signs of infection noted. Skin is warm, dry and supple bilateral lower extremities. Negative for open lesions or macerations.  Vascular: Dorsalis Pedis artery and Posterior Tibial artery pedal pulses palpable. No lower extremity edema noted.   Neruologic: Grossly intact via light touch bilateral.  Musculoskeletal: Muscular strength within normal limits in all groups bilateral. Normal range of motion noted to all pedal and ankle joints.   Assessment:  #1 Dystrophic nail of the bilateral great toes #2 Onychodystrophic nails 1-5 bilateral with hyperkeratosis of nails.  #3 Onychomycosis of nail due to dermatophyte bilateral   Plan of Care:  1. Patient evaluated.  2. Discussed treatment alternatives and plan of care. Explained nail avulsion  procedure and post procedure course to patient. 3. Patient opted for total temporary nail avulsion of the bilateral great toenails.  4. Procedure performed without local anesthesia.  5. Light dressing applied. 6. Instructed to maintain good pedal hygiene and foot care.  7. Mechanical debridement of nails 1-5 bilaterally performed using a nail nipper. Filed with dremel without incident.  8. Return to clinic in three months.   Edrick Kins, DPM [facility]  Dr. Edrick Kins, [address]                                        [place], [place] [address]                Office [phone]  Fax [phone]

## 2018-12-29 ENCOUNTER — Other Ambulatory Visit: Payer: Self-pay | Admitting: Hematology

## 2018-12-29 DIAGNOSIS — C49A2 Gastrointestinal stromal tumor of stomach: Secondary | ICD-10-CM

## 2018-12-31 MED FILL — IMATINIB MESYLATE 100 MG TA: 100 | 30 days supply | Qty: 120 | Fill #0

## 2019-01-21 ENCOUNTER — Other Ambulatory Visit: Payer: Self-pay | Admitting: Hematology

## 2019-01-21 DIAGNOSIS — C49A2 Gastrointestinal stromal tumor of stomach: Secondary | ICD-10-CM

## 2019-01-28 MED FILL — IMATINIB MESYLATE 100 MG TA: 100 | 30 days supply | Qty: 120 | Fill #0

## 2019-02-06 IMAGING — CT CT BIOPSY
1 of 2 series · 15 of 32 positions shown, 19 images · non-contrast
Comparison: none

CLINICAL DATA: Gastric mass

[Series 2: i-spiral 5.0 b40f · axial · 0.88mm/px · z∈[+911,+1103]mm · 15 of 61 slices shown, 19 images]
[im 3/61  soft-tissue]
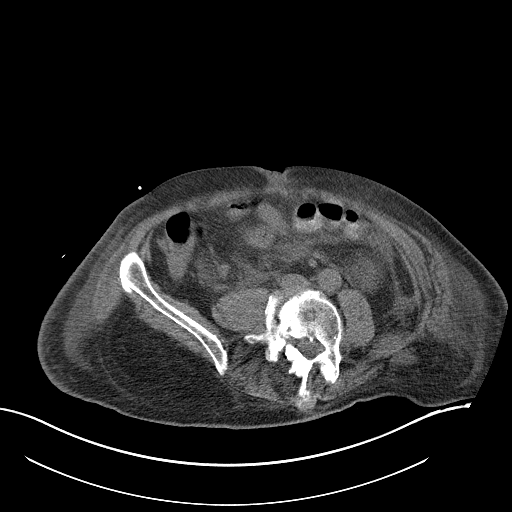
[im 3/61  bone]
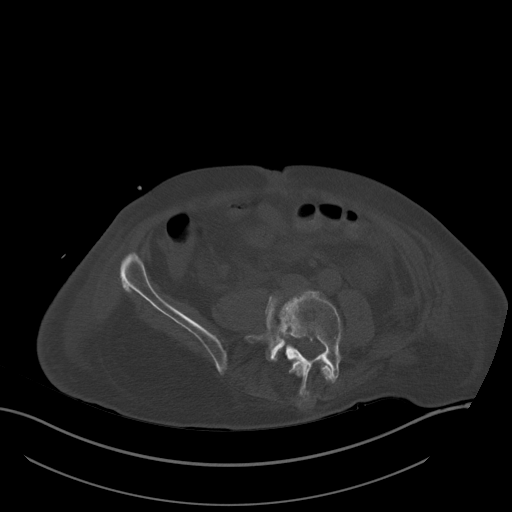
[im 8/61  soft-tissue]
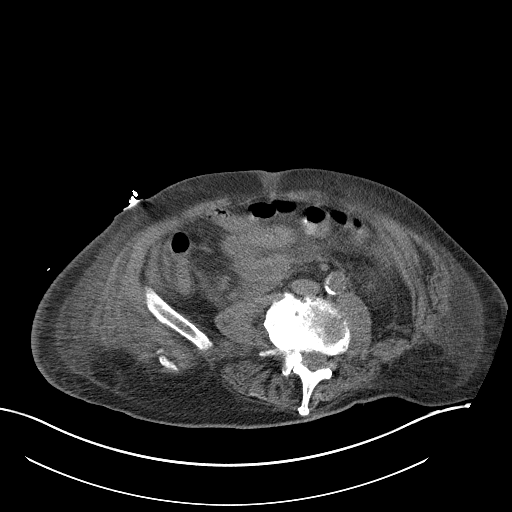
[im 13/61  soft-tissue]
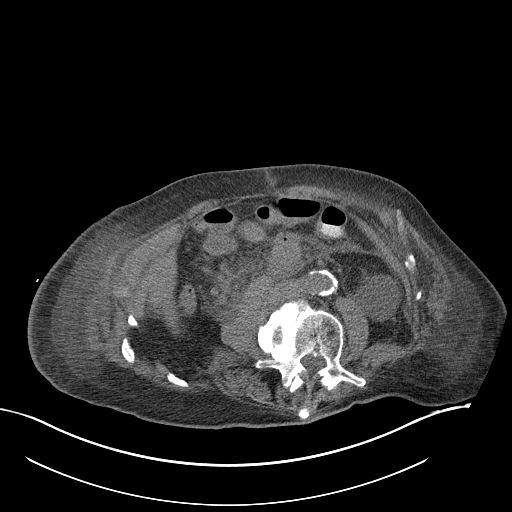
[im 18/61  soft-tissue]
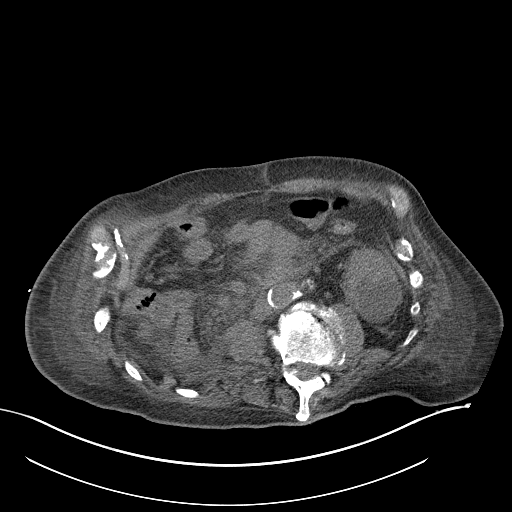
[im 21/61  soft-tissue]
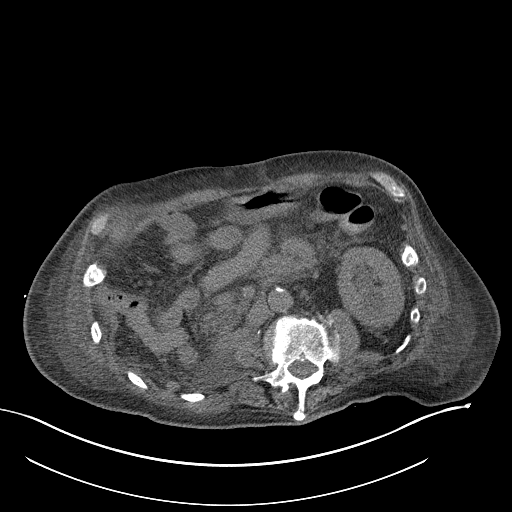
[im 26/61  soft-tissue]
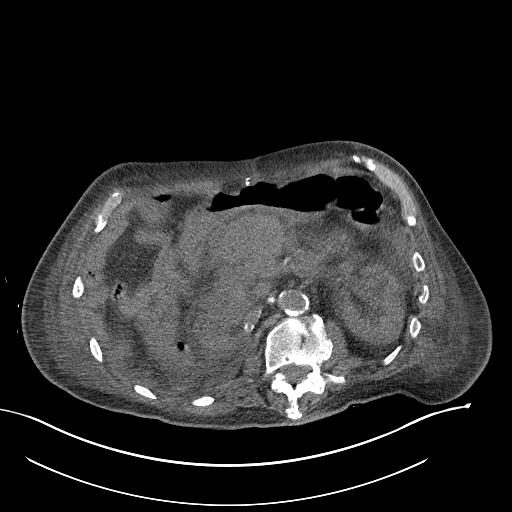
[im 31/61  soft-tissue]
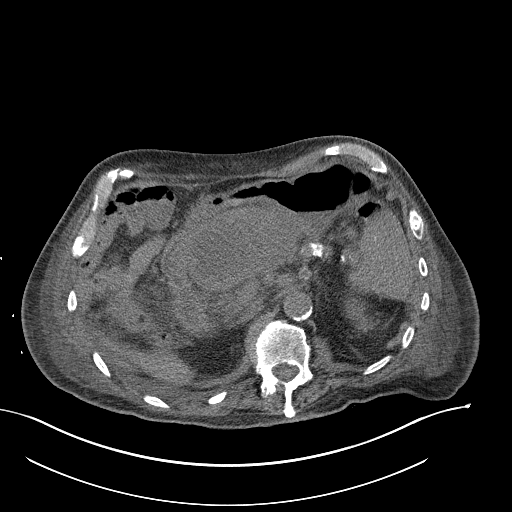
[im 36/61  soft-tissue]
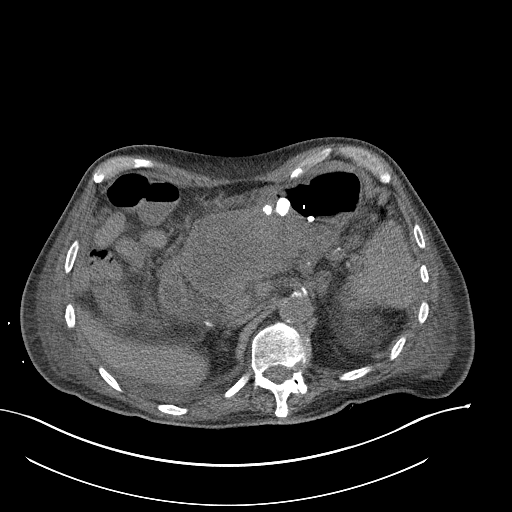
[im 41/61  soft-tissue]
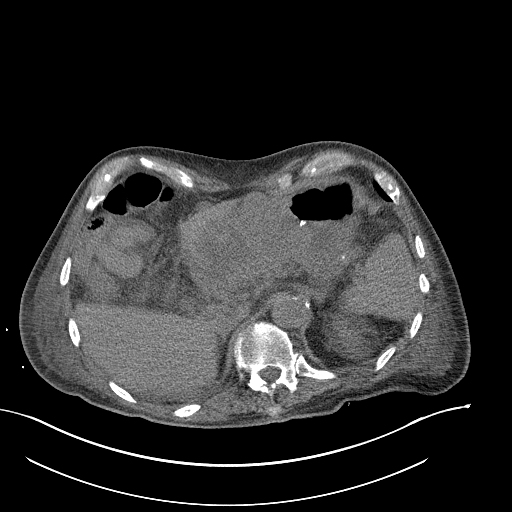
[im 41/61  bone]
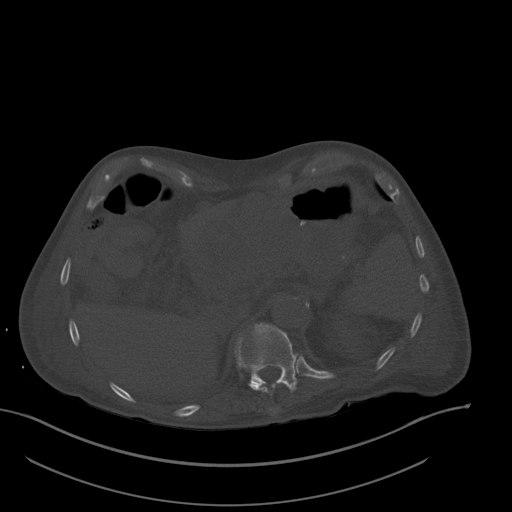
[im 43/61  soft-tissue]
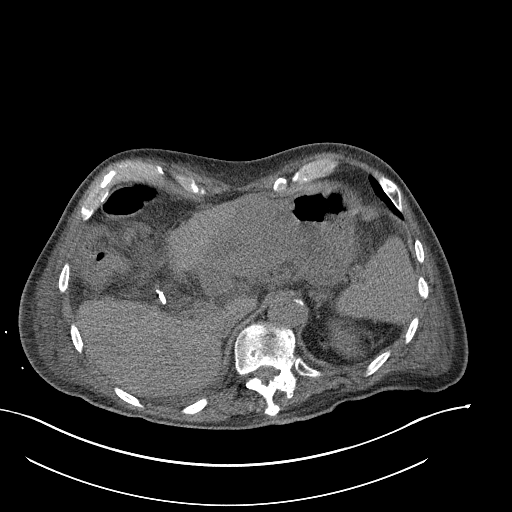
[im 48/61  soft-tissue]
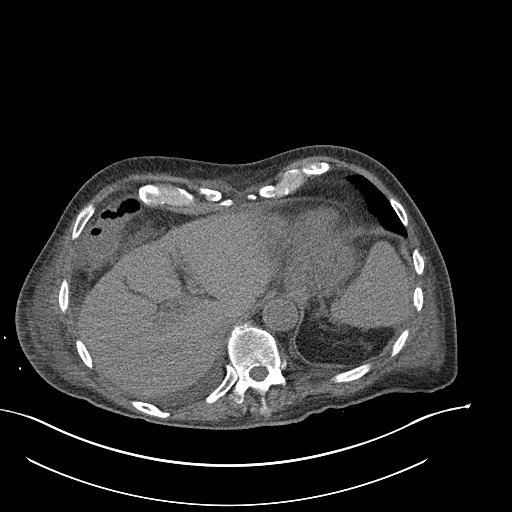
[im 51/61  lung]
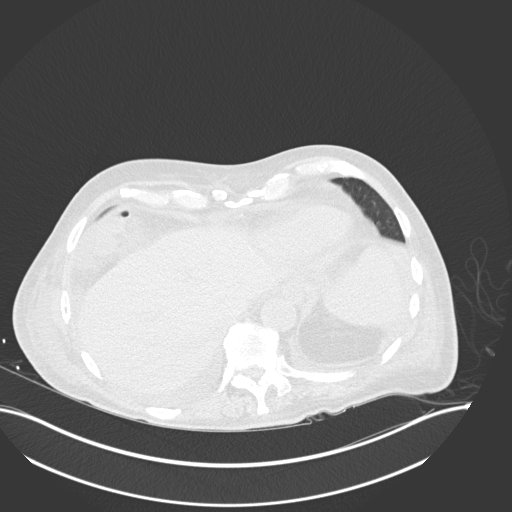
[im 53/61  soft-tissue]
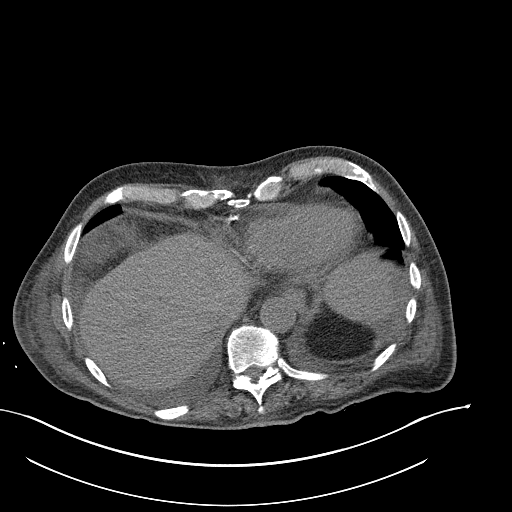
[im 53/61  lung]
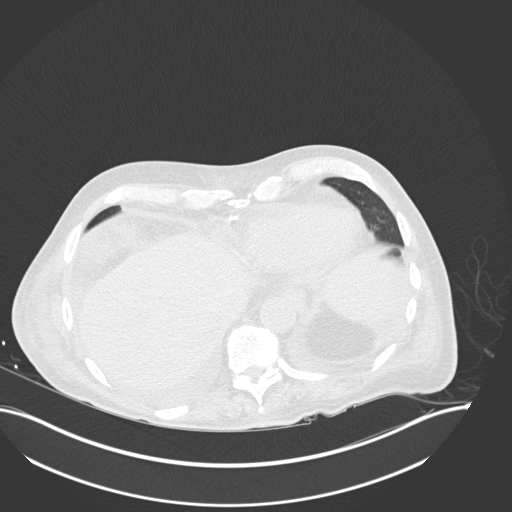
[im 56/61  lung]
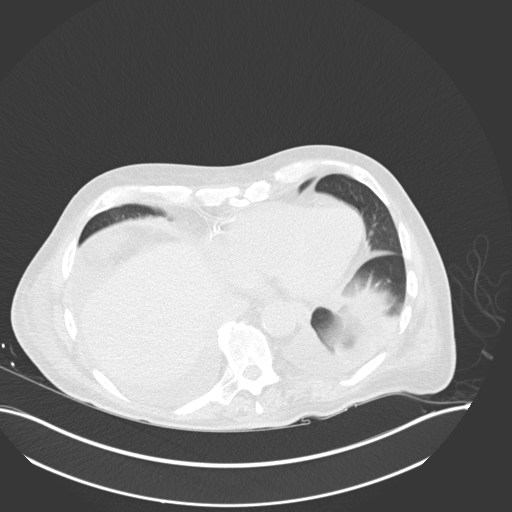
[im 58/61  soft-tissue]
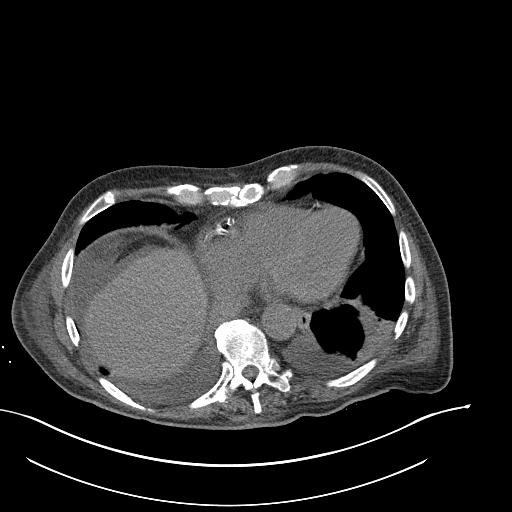
[im 58/61  lung]
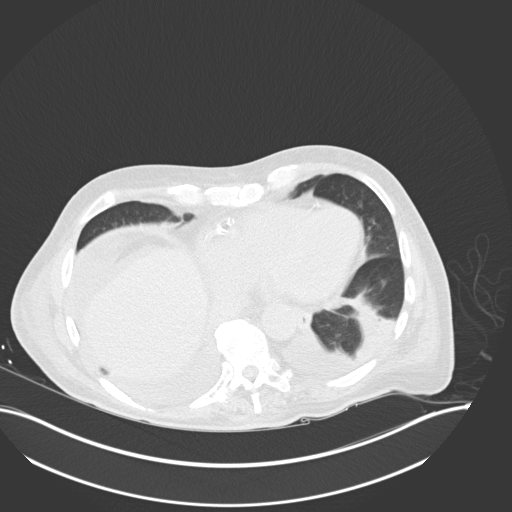

[15 of 32 positions shown; findings below may reference images not displayed]

EXAM:
CT GUIDED CORE BIOPSY OF GASTRIC MASS

ANESTHESIA/SEDATION:
Intravenous Fentanyl and Versed were administered as conscious
sedation during continuous monitoring of the patient's level of
consciousness and physiological / cardiorespiratory status by the
radiology RN, with a total moderate sedation time of less than 20
minutes.

PROCEDURE:
The procedure risks, benefits, and alternatives were explained to
the patient. Questions regarding the procedure were encouraged and
answered. The patient understands and consents to the procedure.

Select axial scans through the abdomen obtained. The lesion was
localized and an appropriate skin entry site was determined and
marked.

The operative field was prepped with chlorhexidinein a sterile
fashion, and a sterile drape was applied covering the operative
field. A sterile gown and sterile gloves were used for the
procedure. Local anesthesia was provided with 1% Lidocaine.

Under CT fluoroscopic guidance, a 17 gauge trocar needle was
advanced to the margin of the lesion. Once needle tip position was
confirmed, coaxial 18-gauge core biopsy samples were obtained,
submitted in formalin to surgical pathology. The guide needle was
removed. Postprocedure scans show no hemorrhage or other apparent
complication. The patient tolerated the procedure well.

COMPLICATIONS:
None immediate
FINDINGS: Complex gastric mass was localized. Representative core biopsy
samples obtained as above.
IMPRESSION: 1. Technically successful CT-guided core biopsy, gastric mass.

## 2019-02-16 ENCOUNTER — Other Ambulatory Visit: Payer: Self-pay | Admitting: Hematology

## 2019-02-16 DIAGNOSIS — C49A2 Gastrointestinal stromal tumor of stomach: Secondary | ICD-10-CM

## 2019-02-18 ENCOUNTER — Telehealth: Payer: Self-pay | Admitting: Hematology

## 2019-02-18 NOTE — Telephone Encounter (Signed)
Cancelled and r/s appt on 3/20 per Dr. Burr Medico due to Covid 19 - unable to reach patient . Left message for patient with appt date and time of new appt.

## 2019-02-19 ENCOUNTER — Inpatient Hospital Stay: Payer: Medicare Other

## 2019-02-19 ENCOUNTER — Inpatient Hospital Stay: Payer: Medicare Other | Admitting: Hematology

## 2019-02-23 MED FILL — IMATINIB MESYLATE 100 MG TA: 100 | 30 days supply | Qty: 120 | Fill #0

## 2019-03-15 ENCOUNTER — Ambulatory Visit: Payer: Medicare Other | Admitting: Podiatry

## 2019-03-18 NOTE — Progress Notes (Signed)
[facility]   [phone] [phone]   Clinic Follow up Note   Patient Care Team: Velna Hatchet, MD as PCP - General (Internal Medicine) [name], MD as PCP - Cardiology (Cardiology)   I connected with Casey Gates on 03/22/2019 at 11:15 AM EDT by telephone visit and verified that I am speaking with the correct person using two identifiers.  I discussed the limitations, risks, security and privacy concerns of performing an evaluation and management service by telephone and the availability of in person appointments. I also discussed with the patient that there may be a patient responsible charge related to this service. The patient expressed understanding and agreed to proceed.   Other persons participating in the visit and their role in the encounter:  His son Casey Gates and wound nurse   [facility]  Provider's location:  My office   CHIEF COMPLAINT:  F/u of GIST  SUMMARY OF ONCOLOGIC HISTORY: Oncology History   Cancer Staging Malignant gastrointestinal stromal tumor (GIST) of stomach ([place]) Staging form: Gastrointestinal Stromal Tumor - Gastric and Omental GIST, AJCC 8th Edition - Pathologic stage from 04/22/2018: Stage IIIB (pT4, pN0, cM0, Mitotic Rate: High) - Signed by Alla Feeling, NP on 04/22/2018       Malignant gastrointestinal stromal tumor (GIST) of stomach ([place])   02/16/2018 Procedure    Upper EGD per Dr. [name] Impression:  - Normal upper third of esophagus and middle third of esophagus. - 5 cm hiatal hernia. - Widely patent Schatzki ring. - Clotted blood in the gastric body. - Rule out malignancy, gastric tumor on the lesser curvature of the stomach. Injected. Clips were placed. - Normal examined duodenum. - No specimens collected.    02/17/2018 Imaging    CT AP IMPRESSION: 1. 9.7 x 8.8 x 10.9 cm aggressive appearing heterogeneously enhancing mass along the lesser curvature of the stomach,  highly concerning for primary gastric neoplasm. This is intimately associated with the undersurface of the left lobe of the liver without definitive evidence of direct invasion (although superficial involvement of the hepatic capsule is suspected given the loss of the intervening fat plane). There is also an indeterminate left adrenal nodule measuring 2.0 x 1.4 cm, which could be metastatic. No other definite signs of metastatic disease elsewhere in the abdomen or pelvis. 2. Areas of cylindrical bronchiectasis and chronic scarring in the lower lobes of the lungs bilaterally. 3. Aortic atherosclerosis, in addition to left main and 3 vessel coronary artery disease. 4. Right kidney is either surgically or congenitally absent, or severely atrophic. 5. Additional incidental findings, as above.    02/19/2018 Initial Biopsy    VIA CT BIOPSY: Diagnosis -  Soft Tissue Needle Core Biopsy, gastric - FINDINGS CONSISTENT WITH GASTROINTESTINAL STROMAL TUMOR (GIST). - SEE COMMENT.    02/19/2018 Pathology Results    Diagnosis 02/19/2018 Soft Tissue Needle Core Biopsy, gastric - FINDINGS CONSISTENT WITH GASTROINTESTINAL STROMAL TUMOR (GIST).    03/12/2018 Initial Diagnosis    Malignant gastrointestinal stromal tumor (GIST) of stomach ([place])    03/12/2018 Pathology Results    Surgical path: Diagnosis Stomach, resection for tumor, Lesser curvature - GASTROINTESTINAL STROMAL TUMOR, 13.8 CM.    03/18/2018 Imaging    CT CAP IMPRESSION: -obtained d/t post op fever, leukocytosis, and respiratory change   Moderate right pleural effusion and small left pleural effusion. Compressive atelectasis versus pneumonia in the lower lobes. Nodular airspace disease throughout much of the right upper lobe and right middle lobe  concerning for pneumonia.  Cardiomegaly. Severe/diffuse coronary artery disease. Aortic atherosclerosis. No evidence of aortic aneurysm.  Postoperative changes from recent partial gastrectomy. Small  amount of free air in the abdomen, likely related to post op state. No evidence of bowel obstruction.  Prior remote right nephrectomy and prostatectomy.    03/22/2018 Procedure    Thoracentesis  Diagnosis PLEURAL FLUID, RIGHT (SPECIMEN 1 OF 1, COLLECTED ON 03/22/2018): NO MALIGNANT CELLS IDENTIFIED.      04/22/2018 Cancer Staging    Staging form: Gastrointestinal Stromal Tumor - Gastric and Omental GIST, AJCC 8th Edition - Pathologic stage from 04/22/2018: Stage IIIB (pT4, pN0, cM0, Mitotic Rate: High) - Signed by Alla Feeling, NP on 04/22/2018    05/03/2018 Imaging    05/03/2018 CT Abdomen and Pelvis W/ Contrast IMPRESSION: CTA chest:  1. No pulmonary embolus. 2. Debris within the right lower lobe bronchus with associated right lower lobe opacity, this can be seen with aspiration. 3. Improved right upper lobe aeration from prior consistent with resolving pneumonia. Near completely resolved right pleural effusion. 4. Chronic bilateral lower lobe bronchiectasis. Bilateral lower lobe airspace disease with some improvement from most recent prior. 5. Coronary artery calcifications. Aortic Atherosclerosis (ICD10-I70.0).  CT abdomen/pelvis:  1. Large colonic stool burden, suggesting constipation. No bowel obstruction. 2. Post recent partial gastrectomy without evident complication. No intra-abdominal abscess. 3. Additional stable chronic findings as described. Aortic Atherosclerosis (ICD10-I70.0).    05/03/2018 Imaging    05/03/2018 CT Angio Chest PE W or WO Contrast IMPRESSION: CTA chest:  1. No pulmonary embolus. 2. Debris within the right lower lobe bronchus with associated right lower lobe opacity, this can be seen with aspiration. 3. Improved right upper lobe aeration from prior consistent with resolving pneumonia. Near completely resolved right pleural effusion. 4. Chronic bilateral lower lobe bronchiectasis. Bilateral lower lobe airspace disease with some  improvement from most recent prior. 5. Coronary artery calcifications. Aortic Atherosclerosis (ICD10-I70.0).    05/03/2018 Imaging    05/03/2018 CT CAP IMPRESSION: CTA chest: 1. No pulmonary embolus. 2. Debris within the right lower lobe bronchus with associated right lower lobe opacity, this can be seen with aspiration. 3. Improved right upper lobe aeration from prior consistent with resolving pneumonia. Near completely resolved right pleural effusion. 4. Chronic bilateral lower lobe bronchiectasis. Bilateral lower lobe airspace disease with some improvement from most recent prior. 5. Coronary artery calcifications. Aortic Atherosclerosis (ICD10-I70.0).  CT abdomen/pelvis: 1. Large colonic stool burden, suggesting constipation. No bowel obstruction. 2. Post recent partial gastrectomy without evident complication. No intra-abdominal abscess. 3. Additional stable chronic findings as described. Aortic Atherosclerosis (ICD10-I70.0).     05/26/2018 -  Chemotherapy    Adjuvant Gleevec, starting on 05/26/18 at '200mg'$  daily and gradually increased to '400mg'$  daily since 06/23/2018    11/19/2018 Imaging    CT CAP  IMPRESSION: 1. Stable exam. No new or progressive interval findings. No features to suggest metastatic disease in the chest, abdomen, or pelvis. 2. Bronchiectasis/scarring in the lung bases, similar to prior. 3. Stable small left adrenal nodule. 4.  Aortic Atherosclerois (ICD10-170.0)      CURRENT THERAPY:  Adjuvant Gleevec, starting on 05/26/18 at '200mg'$  daily and gradually increased to '400mg'$  daily since 06/23/2018  INTERVAL HISTORY:  Casey Gates is here for a follow up of GIST. He was able to identify himself by birth date. He notes he is doing well. He has been living in a retirement home for over 6 years. He denies any new changes since last visit. He  overall is stable. At retirement home he was able to do limited exercise programs before  COVID-19 due to his back. He  takes oxymorphone for his back pain and has been on it for over 5 years. He is able to ambulate with a walker or powered scooter. He is able to get around by himself that way. He is no longer being seen by a PT. He does get help with showers. He continues to eat his meals now delivered to his room. He notes knee pain as well.  His son notes he has a sacral bed sore that has not gone away. His is being seen by wound care nurse. She notes his shallow bed sore has mild tunneling about 0.8cm. She continues to pack it and cover it. She notes he has Temp of 57 F today. He was not recommended to use doughnut.  He is still taking Gleevec and tolerating with no issues.     REVIEW OF SYSTEMS:   Constitutional: Denies fevers, chills or abnormal weight loss Eyes: Denies blurriness of vision Ears, nose, mouth, throat, and face: Denies mucositis or sore throat Respiratory: Denies cough, dyspnea or wheezes Cardiovascular: Denies palpitation, chest discomfort or lower extremity swelling Gastrointestinal:  Denies nausea, heartburn or change in bowel habits Skin: Denies abnormal skin rashes (+) bed sore of sacrum  MSK: (+) Chronic back pain, ambulates with walker or scooter Lymphatics: Denies new lymphadenopathy or easy bruising Neurological:Denies numbness, tingling or new weaknesses Behavioral/Psych: Mood is stable, no new changes  All other systems were reviewed with the patient and are negative.  MEDICAL HISTORY:  Past Medical History:  Diagnosis Date  . Anemia   . Asthma    AS A CHILD  . Blind left eye   . Cancer [facility])    PROSTATE CANCER  . Cellulitis    frequently, "all over"  . Chronic kidney disease   . Chronic pain syndrome   . Chronic ulcer of toe, right, with unspecified severity ([place])    2nd toe  . Constipation   . DDD (degenerative disc disease), lumbar   . Gastrointestinal stromal tumor (GIST) of stomach ([place])   . GI bleeding   . History of blood transfusion    01/2018  . Insomnia    . Kyphoscoliosis   . Mass in the abdomen 01/2018  . Restless leg   . Sleep apnea    does not wear CPAP  . Wears glasses     SURGICAL HISTORY: Past Surgical History:  Procedure Laterality Date  . AMPUTATION TOE Right 11/05/2018   Procedure: Right 2nd toe ampuation;  Surgeon: Wylene Simmer, MD;  Location: [facility];  Service: Orthopedics;  Laterality: Right;  16mn  . CHOLECYSTECTOMY    . COLONOSCOPY    . ESOPHAGOGASTRODUODENOSCOPY (EGD) WITH PROPOFOL Left 02/16/2018   Procedure: ESOPHAGOGASTRODUODENOSCOPY (EGD) WITH PROPOFOL;  Surgeon: KRonnette Juniper MD;  Location[facility]  Service: Gastroenterology;  Laterality: Left;  . LAPAROSCOPIC GASTRECTOMY N/A 03/12/2018   Procedure: LAPAROSCOPIC ASSISTED PARTIAL GASTRECTOMY ERAS PATHWAY;  Surgeon: BStark Klein MD;  [place] [facility]  Service: General;  Laterality: N/A;  . LAPAROSCOPIC PARTIAL GASTRECTOMY  03/12/2018   LAPAROSCOPIC ASSISTED PARTIAL GASTRECTOMY ERAS PATHWAY  . PROSTATECTOMY    . QUADRICEPS TENDON REPAIR Left 07/07/2017   Procedure: REPAIR QUADRICEP TENDON;  Surgeon: SRod Can MD;  [facility] [facility]  Service: Orthopedics;  Laterality: Left;  . RIGHT CATARACT EXTRACTION  2013  . RIGTH NEPHRECTOMY  2006  . TONSILLECTOMY    .  TRANSURETHRAL RESECTION OF PROSTATE  1997    I have reviewed the social history and family history with the patient and they are unchanged from previous note.  ALLERGIES:  is allergic to coconut flavor [flavoring agent]; coconut oil; and food.  MEDICATIONS:  Current Outpatient Medications  Medication Sig Dispense Refill  . ferrous sulfate 325 (65 FE) MG tablet Take 325 mg by mouth daily with breakfast.    . gabapentin (NEURONTIN) 300 MG capsule Take 1 capsule (300 mg total) by mouth 3 (three) times daily. (Patient taking differently: Take 300 mg by mouth See admin instructions. Take 300 mg by mouth two to three times a day)    . imatinib (GLEEVEC) 100 MG tablet TAKE 4 TABLETS  ('400MG'$ ) BY MOUTH ONCE DAILY ON A FULL STOMACH AND A GLASS OF WATER. 120 tablet 0  . mirtazapine (REMERON) 45 MG tablet Take 45 mg by mouth at bedtime.    Marland Kitchen oxymorphone (OPANA) 10 MG tablet Take 10 mg by mouth every 6 (six) hours as needed for pain.     Marland Kitchen senna-docusate (SENOKOT-S) 8.6-50 MG tablet Take 1 tablet by mouth at bedtime as needed for mild constipation. (Patient taking differently: Take 1-4 tablets by mouth at bedtime as needed for mild constipation. )     No current facility-administered medications for this visit.     PHYSICAL EXAMINATION: ECOG PERFORMANCE STATUS: 3 - Symptomatic, >50% confined to bed  ** No vitals taken today, Exam not performed today **  LABORATORY DATA:  I have reviewed the data as listed CBC Latest Ref Rng & Units 11/18/2018 11/11/2018 10/11/2018  WBC 4.0 - 10.5 K/uL 4.6 4.2 3.6(L)  Hemoglobin 13.0 - 17.0 g/dL 12.3(L) 11.8(L) 11.6(L)  Hematocrit 39.0 - 52.0 % 38.1(L) 37.5(L) 37.1(L)  Platelets 150 - 400 K/uL 194 171 132(L)     CMP Latest Ref Rng & Units 11/18/2018 11/11/2018 10/11/2018  Glucose 70 - 99 mg/dL 81 116(H) 107(H)  BUN 8 - 23 mg/dL '15 14 16  '$ Creatinine 0.61 - 1.24 mg/dL 0.97 0.97 1.05  Sodium 135 - 145 mmol/L 135 137 137  Potassium 3.5 - 5.1 mmol/L 4.7 4.5 4.0  Chloride 98 - 111 mmol/L 102 102 105  CO2 22 - 32 mmol/L '27 29 28  '$ Calcium 8.9 - 10.3 mg/dL 9.0 8.7(L) 8.7(L)  Total Protein 6.5 - 8.1 g/dL 6.6 6.1(L) -  Total Bilirubin 0.3 - 1.2 mg/dL 0.5 0.4 -  Alkaline Phos 38 - 126 U/L 76 60 -  AST 15 - 41 U/L 36 27 -  ALT 0 - 44 U/L 25 16 -      RADIOGRAPHIC STUDIES: I have personally reviewed the radiological images as listed and agreed with the findings in the report. No results found.   ASSESSMENT & PLAN:  Kelcy Baeten is a 85 y.o. male with   1. Gastrointestinal Stromal Tumor (GIST) of the lesser curvature of the stomach, 13.8 cm, pT4pNX, G2, mitotic rate >20/50 high power field, stage IIIB -He was diagnosed in 01/2018. He is  s/p tumor resection. Pathology revealed a large 13.8 cm GIST in the stomach. IHC was performed and the tumor is positive with CD117 (C-Kit). Genomic testing showed Kit Exon 11 mutation/insertion, which predicts benefit of Gleevec  -He has been on Gleevec since 05/26/18, currently at '400mg'$  daily. Plan for 3 years. He is tolerating well  -Due to her advanced age, comorbidities, and limited performance status, if he does have significant side effects from [place], I have low threshold to  stop it. -From a GIST standpoint he is clinically doing well. He will continue Gleevec for total of 3 years.  -F/u in 3 months. He can do lab at wound clinic or in our clinic in interim.   2. Anemia, iron deficient  -He has been treated with blood transfusion  -Currently on oral iron therapy 1 tab daily beginning in 04/2018, continue daily.    3. Chronic constipation, intermittent diarrhea  -He is on chronic opioid use with oxymorphone for chronic back pain, this is contributing to constipation.   -He gives himself enemas weekly if no bowel movement in 3 days.  -He can take Senna pills up to 5 times a day. If he develops diarrhea he can switch to stool softener such as colace.  -His PCP prescribed him Lynzest, I previously encouraged him to try it.    4. Low appetite  -He remains to have low appetite but overall stable weight.  -I encouraged him to increase his nutritional supplement to BID and continue Mirtazapine.  -He is eating overall adequately at retirement home.   5. S/p right 2nd toe amputation, right lower leg infection -He completed antibiotics this week -right leg erythema still present -Will monitor. F/u with PCP   6. Chronic Back pain, Decubitus pressure ulcer  -He has been on long term oxymorphone as needed for his chronic back pain, which is the main factor limiting his activities . -he has developed a pressure sore of sacrum recently. He has been having slow healing. I spoke with his  wound nurse today  -Will continue to follow up with wound nurse MWF. He plans to be seen by wound clinic soon.    PLAN: -Continue Gleevec 400 mg daily, he is tolerating well.Refilled today  -Lab and f/u in 3 months, he may get lab at wound clinic or here in interim     No problem-specific Assessment & Plan notes found for this encounter.   No orders of the defined types were placed in this encounter.  I discussed the assessment and treatment plan with the patient. The patient was provided an opportunity to ask questions and all were answered. The patient agreed with the plan and demonstrated an understanding of the instructions.  The patient was advised to call back or seek an in-person evaluation if the symptoms worsen or if the condition fails to improve as anticipated.   I provided 25 minutes of non face-to-face telephone visit time during this encounter, and > 50% was spent counseling as documented under my assessment & plan.    Truitt Merle, MD 03/22/2019   I, Joslyn Devon, am acting as scribe for Truitt Merle, MD.   I have reviewed the above documentation for accuracy and completeness, and I agree with the above.

## 2019-03-20 ENCOUNTER — Other Ambulatory Visit: Payer: Self-pay | Admitting: Hematology

## 2019-03-20 DIAGNOSIS — C49A2 Gastrointestinal stromal tumor of stomach: Secondary | ICD-10-CM

## 2019-03-22 ENCOUNTER — Inpatient Hospital Stay: Payer: Medicare Other | Attending: Hematology | Admitting: Hematology

## 2019-03-22 ENCOUNTER — Telehealth: Payer: Self-pay | Admitting: Hematology

## 2019-03-22 ENCOUNTER — Other Ambulatory Visit: Payer: Self-pay

## 2019-03-22 ENCOUNTER — Encounter: Payer: Self-pay | Admitting: Hematology

## 2019-03-22 ENCOUNTER — Other Ambulatory Visit: Payer: Medicare Other

## 2019-03-22 DIAGNOSIS — M549 Dorsalgia, unspecified: Secondary | ICD-10-CM | POA: Diagnosis not present

## 2019-03-22 DIAGNOSIS — K5900 Constipation, unspecified: Secondary | ICD-10-CM | POA: Diagnosis not present

## 2019-03-22 DIAGNOSIS — Z79899 Other long term (current) drug therapy: Secondary | ICD-10-CM

## 2019-03-22 DIAGNOSIS — C49A2 Gastrointestinal stromal tumor of stomach: Secondary | ICD-10-CM | POA: Diagnosis not present

## 2019-03-22 DIAGNOSIS — G8929 Other chronic pain: Secondary | ICD-10-CM

## 2019-03-22 DIAGNOSIS — Z79891 Long term (current) use of opiate analgesic: Secondary | ICD-10-CM

## 2019-03-22 NOTE — Telephone Encounter (Signed)
Scheduled appt per 4/20 los. °

## 2019-03-23 MED FILL — IMATINIB MESYLATE 100 MG TA: 100 | 30 days supply | Qty: 120 | Fill #0

## 2019-03-26 ENCOUNTER — Encounter (HOSPITAL_BASED_OUTPATIENT_CLINIC_OR_DEPARTMENT_OTHER): Payer: Medicare Other | Attending: Internal Medicine

## 2019-03-26 DIAGNOSIS — Z8546 Personal history of malignant neoplasm of prostate: Secondary | ICD-10-CM | POA: Insufficient documentation

## 2019-03-26 DIAGNOSIS — G4730 Sleep apnea, unspecified: Secondary | ICD-10-CM | POA: Insufficient documentation

## 2019-03-26 DIAGNOSIS — L89154 Pressure ulcer of sacral region, stage 4: Secondary | ICD-10-CM | POA: Diagnosis not present

## 2019-03-26 DIAGNOSIS — Z9221 Personal history of antineoplastic chemotherapy: Secondary | ICD-10-CM | POA: Insufficient documentation

## 2019-03-26 DIAGNOSIS — Z85028 Personal history of other malignant neoplasm of stomach: Secondary | ICD-10-CM | POA: Insufficient documentation

## 2019-03-26 DIAGNOSIS — Z89421 Acquired absence of other right toe(s): Secondary | ICD-10-CM | POA: Insufficient documentation

## 2019-04-07 ENCOUNTER — Inpatient Hospital Stay: Payer: Medicare Other

## 2019-04-07 ENCOUNTER — Encounter: Payer: Self-pay | Admitting: Nurse Practitioner

## 2019-04-07 ENCOUNTER — Ambulatory Visit (HOSPITAL_COMMUNITY)
Admission: RE | Admit: 2019-04-07 | Discharge: 2019-04-07 | Disposition: A | Discharge status: Home / Self Care | Payer: Medicare Other | Source: Ambulatory Visit | Attending: Nurse Practitioner | Admitting: Nurse Practitioner

## 2019-04-07 ENCOUNTER — Inpatient Hospital Stay: Payer: Medicare Other | Attending: Hematology | Admitting: Nurse Practitioner

## 2019-04-07 ENCOUNTER — Other Ambulatory Visit: Payer: Self-pay | Admitting: Nurse Practitioner

## 2019-04-07 ENCOUNTER — Telehealth: Payer: Self-pay

## 2019-04-07 ENCOUNTER — Other Ambulatory Visit: Payer: Self-pay

## 2019-04-07 VITALS — BP 126/56 | HR 66 | Temp 99.6°F | Resp 17 | Ht 71.0 in | Wt 146.5 lb

## 2019-04-07 DIAGNOSIS — G894 Chronic pain syndrome: Secondary | ICD-10-CM | POA: Insufficient documentation

## 2019-04-07 DIAGNOSIS — I2510 Atherosclerotic heart disease of native coronary artery without angina pectoris: Secondary | ICD-10-CM | POA: Diagnosis not present

## 2019-04-07 DIAGNOSIS — R42 Dizziness and giddiness: Secondary | ICD-10-CM

## 2019-04-07 DIAGNOSIS — N189 Chronic kidney disease, unspecified: Secondary | ICD-10-CM | POA: Insufficient documentation

## 2019-04-07 DIAGNOSIS — I700 Atherosclerosis of aorta: Secondary | ICD-10-CM | POA: Diagnosis not present

## 2019-04-07 DIAGNOSIS — C49A2 Gastrointestinal stromal tumor of stomach: Secondary | ICD-10-CM | POA: Diagnosis present

## 2019-04-07 DIAGNOSIS — R630 Anorexia: Secondary | ICD-10-CM | POA: Diagnosis not present

## 2019-04-07 DIAGNOSIS — I959 Hypotension, unspecified: Secondary | ICD-10-CM | POA: Diagnosis not present

## 2019-04-07 DIAGNOSIS — J90 Pleural effusion, not elsewhere classified: Secondary | ICD-10-CM | POA: Insufficient documentation

## 2019-04-07 DIAGNOSIS — K449 Diaphragmatic hernia without obstruction or gangrene: Secondary | ICD-10-CM | POA: Insufficient documentation

## 2019-04-07 DIAGNOSIS — J45909 Unspecified asthma, uncomplicated: Secondary | ICD-10-CM | POA: Insufficient documentation

## 2019-04-07 DIAGNOSIS — D509 Iron deficiency anemia, unspecified: Secondary | ICD-10-CM | POA: Diagnosis not present

## 2019-04-07 DIAGNOSIS — K5900 Constipation, unspecified: Secondary | ICD-10-CM

## 2019-04-07 DIAGNOSIS — G4730 Sleep apnea, unspecified: Secondary | ICD-10-CM | POA: Insufficient documentation

## 2019-04-07 DIAGNOSIS — Z79899 Other long term (current) drug therapy: Secondary | ICD-10-CM | POA: Insufficient documentation

## 2019-04-07 DIAGNOSIS — M5136 Other intervertebral disc degeneration, lumbar region: Secondary | ICD-10-CM | POA: Insufficient documentation

## 2019-04-07 DIAGNOSIS — G4700 Insomnia, unspecified: Secondary | ICD-10-CM | POA: Insufficient documentation

## 2019-04-07 DIAGNOSIS — R1012 Left upper quadrant pain: Secondary | ICD-10-CM | POA: Diagnosis not present

## 2019-04-07 DIAGNOSIS — K5909 Other constipation: Secondary | ICD-10-CM | POA: Insufficient documentation

## 2019-04-07 LAB — CBC WITH DIFFERENTIAL (CANCER CENTER ONLY)
Abs Immature Granulocytes: 0.01 10*3/uL (ref 0.00–0.07)
Basophils Absolute: 0 10*3/uL (ref 0.0–0.1)
Basophils Relative: 1 %
Eosinophils Absolute: 0.4 10*3/uL (ref 0.0–0.5)
Eosinophils Relative: 9 %
HCT: 35.9 % — ABNORMAL LOW (ref 39.0–52.0)
Hemoglobin: 11.4 g/dL — ABNORMAL LOW (ref 13.0–17.0)
Immature Granulocytes: 0 %
Lymphocytes Relative: 25 %
Lymphs Abs: 1.1 10*3/uL (ref 0.7–4.0)
MCH: 31.8 pg (ref 26.0–34.0)
MCHC: 31.8 g/dL (ref 30.0–36.0)
MCV: 100 fL (ref 80.0–100.0)
Monocytes Absolute: 0.5 10*3/uL (ref 0.1–1.0)
Monocytes Relative: 10 %
Neutro Abs: 2.4 10*3/uL (ref 1.7–7.7)
Neutrophils Relative %: 55 %
Platelet Count: 108 10*3/uL — ABNORMAL LOW (ref 150–400)
RBC: 3.59 MIL/uL — ABNORMAL LOW (ref 4.22–5.81)
RDW: 13.4 % (ref 11.5–15.5)
WBC Count: 4.4 10*3/uL (ref 4.0–10.5)
nRBC: 0 % (ref 0.0–0.2)

## 2019-04-07 LAB — CMP (CANCER CENTER ONLY)
ALT: 25 U/L (ref 0–44)
AST: 40 U/L (ref 15–41)
Albumin: 3.5 g/dL (ref 3.5–5.0)
Alkaline Phosphatase: 59 U/L (ref 38–126)
Anion gap: 5 (ref 5–15)
BUN: 24 mg/dL — ABNORMAL HIGH (ref 8–23)
CO2: 28 mmol/L (ref 22–32)
Calcium: 8 mg/dL — ABNORMAL LOW (ref 8.9–10.3)
Chloride: 106 mmol/L (ref 98–111)
Creatinine: 0.86 mg/dL (ref 0.61–1.24)
GFR, Est AFR Am: >60 mL/min (ref >60)
GFR, Estimated: >60 mL/min (ref >60)
Glucose, Bld: 97 mg/dL (ref 70–99)
Potassium: 4.2 mmol/L (ref 3.5–5.1)
Sodium: 139 mmol/L (ref 135–145)
Total Bilirubin: 0.4 mg/dL (ref 0.3–1.2)
Total Protein: 5.5 g/dL — ABNORMAL LOW (ref 6.5–8.1)

## 2019-04-07 LAB — IRON AND TIBC
Iron: 87 ug/dL (ref 42–163)
Saturation Ratios: 37 % (ref 20–55)
TIBC: 234 ug/dL (ref 202–409)
UIBC: 147 ug/dL (ref 117–376)

## 2019-04-07 LAB — FERRITIN: Ferritin: 47 ng/mL (ref 24–336)

## 2019-04-07 MED ORDER — SODIUM CHLORIDE 0.9 % IV SOLN
Freq: Once | INTRAVENOUS | Status: AC
  Administered 2019-04-07: 14:00:00 via INTRAVENOUS
  Filled 2019-04-07: qty 250

## 2019-04-07 MED ORDER — POLYETHYLENE GLYCOL 3350 17 G PO PACK: 17.0000 g | PACK | Freq: Every day | ORAL | 0 refills | Status: DC | PRN

## 2019-04-07 MED ORDER — PANTOPRAZOLE SODIUM 20 MG PO TBEC: 20.0000 mg | DELAYED_RELEASE_TABLET | Freq: Every day | ORAL | 0 refills | Status: DC

## 2019-04-07 NOTE — Progress Notes (Addendum)
[place]   [phone] [phone]   Clinic Follow up Note   Patient Care Team: Velna Hatchet, MD as PCP - General (Internal Medicine) [name], MD as PCP - Cardiology (Cardiology)  Date of Service: 04/07/2019   CHIEF COMPLAINT: abdominal pain   SUMMARY OF ONCOLOGIC HISTORY: Oncology History   Cancer Staging Malignant gastrointestinal stromal tumor (GIST) of stomach ([place]) Staging form: Gastrointestinal Stromal Tumor - Gastric and Omental GIST, AJCC 8th Edition - Pathologic stage from 04/22/2018: Stage IIIB (pT4, pN0, cM0, Mitotic Rate: High) - Signed by Alla Feeling, NP on 04/22/2018       Malignant gastrointestinal stromal tumor (GIST) of stomach ([place])   02/16/2018 Procedure    Upper EGD per Dr. [name] Impression:  - Normal upper third of esophagus and middle third of esophagus. - 5 cm hiatal hernia. - Widely patent Schatzki ring. - Clotted blood in the gastric body. - Rule out malignancy, gastric tumor on the lesser curvature of the stomach. Injected. Clips were placed. - Normal examined duodenum. - No specimens collected.    02/17/2018 Imaging    CT AP IMPRESSION: 1. 9.7 x 8.8 x 10.9 cm aggressive appearing heterogeneously enhancing mass along the lesser curvature of the stomach, highly concerning for primary gastric neoplasm. This is intimately associated with the undersurface of the left lobe of the liver without definitive evidence of direct invasion (although superficial involvement of the hepatic capsule is suspected given the loss of the intervening fat plane). There is also an indeterminate left adrenal nodule measuring 2.0 x 1.4 cm, which could be metastatic. No other definite signs of metastatic disease elsewhere in the abdomen or pelvis. 2. Areas of cylindrical bronchiectasis and chronic scarring in the lower lobes of the lungs bilaterally. 3. Aortic atherosclerosis, in addition to left main and 3 vessel coronary  artery disease. 4. Right kidney is either surgically or congenitally absent, or severely atrophic. 5. Additional incidental findings, as above.    02/19/2018 Initial Biopsy    VIA CT BIOPSY: Diagnosis -  Soft Tissue Needle Core Biopsy, gastric - FINDINGS CONSISTENT WITH GASTROINTESTINAL STROMAL TUMOR (GIST). - SEE COMMENT.    02/19/2018 Pathology Results    Diagnosis 02/19/2018 Soft Tissue Needle Core Biopsy, gastric - FINDINGS CONSISTENT WITH GASTROINTESTINAL STROMAL TUMOR (GIST).    03/12/2018 Initial Diagnosis    Malignant gastrointestinal stromal tumor (GIST) of stomach ([place])    03/12/2018 Pathology Results    Surgical path: Diagnosis Stomach, resection for tumor, Lesser curvature - GASTROINTESTINAL STROMAL TUMOR, 13.8 CM.    03/18/2018 Imaging    CT CAP IMPRESSION: -obtained d/t post op fever, leukocytosis, and respiratory change   Moderate right pleural effusion and small left pleural effusion. Compressive atelectasis versus pneumonia in the lower lobes. Nodular airspace disease throughout much of the right upper lobe and right middle lobe concerning for pneumonia.  Cardiomegaly. Severe/diffuse coronary artery disease. Aortic atherosclerosis. No evidence of aortic aneurysm.  Postoperative changes from recent partial gastrectomy. Small amount of free air in the abdomen, likely related to post op state. No evidence of bowel obstruction.  Prior remote right nephrectomy and prostatectomy.    03/22/2018 Procedure    Thoracentesis  Diagnosis PLEURAL FLUID, RIGHT (SPECIMEN 1 OF 1, COLLECTED ON 03/22/2018): NO MALIGNANT CELLS IDENTIFIED.      04/22/2018 Cancer Staging    Staging form: Gastrointestinal Stromal Tumor - Gastric and Omental GIST, AJCC 8th Edition - Pathologic stage from 04/22/2018: Stage IIIB (pT4, pN0, cM0, Mitotic Rate: High) -  Signed by Alla Feeling, NP on 04/22/2018    05/03/2018 Imaging    05/03/2018 CT Abdomen and Pelvis W/ Contrast IMPRESSION: CTA  chest:  1. No pulmonary embolus. 2. Debris within the right lower lobe bronchus with associated right lower lobe opacity, this can be seen with aspiration. 3. Improved right upper lobe aeration from prior consistent with resolving pneumonia. Near completely resolved right pleural effusion. 4. Chronic bilateral lower lobe bronchiectasis. Bilateral lower lobe airspace disease with some improvement from most recent prior. 5. Coronary artery calcifications. Aortic Atherosclerosis (ICD10-I70.0).  CT abdomen/pelvis:  1. Large colonic stool burden, suggesting constipation. No bowel obstruction. 2. Post recent partial gastrectomy without evident complication. No intra-abdominal abscess. 3. Additional stable chronic findings as described. Aortic Atherosclerosis (ICD10-I70.0).    05/03/2018 Imaging    05/03/2018 CT Angio Chest PE W or WO Contrast IMPRESSION: CTA chest:  1. No pulmonary embolus. 2. Debris within the right lower lobe bronchus with associated right lower lobe opacity, this can be seen with aspiration. 3. Improved right upper lobe aeration from prior consistent with resolving pneumonia. Near completely resolved right pleural effusion. 4. Chronic bilateral lower lobe bronchiectasis. Bilateral lower lobe airspace disease with some improvement from most recent prior. 5. Coronary artery calcifications. Aortic Atherosclerosis (ICD10-I70.0).    05/03/2018 Imaging    05/03/2018 CT CAP IMPRESSION: CTA chest: 1. No pulmonary embolus. 2. Debris within the right lower lobe bronchus with associated right lower lobe opacity, this can be seen with aspiration. 3. Improved right upper lobe aeration from prior consistent with resolving pneumonia. Near completely resolved right pleural effusion. 4. Chronic bilateral lower lobe bronchiectasis. Bilateral lower lobe airspace disease with some improvement from most recent prior. 5. Coronary artery calcifications. Aortic  Atherosclerosis (ICD10-I70.0).  CT abdomen/pelvis: 1. Large colonic stool burden, suggesting constipation. No bowel obstruction. 2. Post recent partial gastrectomy without evident complication. No intra-abdominal abscess. 3. Additional stable chronic findings as described. Aortic Atherosclerosis (ICD10-I70.0).     05/26/2018 -  Chemotherapy    Adjuvant Gleevec, starting on 05/26/18 at '200mg'$  daily and gradually increased to '400mg'$  daily since 06/23/2018    11/19/2018 Imaging    CT CAP  IMPRESSION: 1. Stable exam. No new or progressive interval findings. No features to suggest metastatic disease in the chest, abdomen, or pelvis. 2. Bronchiectasis/scarring in the lung bases, similar to prior. 3. Stable small left adrenal nodule. 4.  Aortic Atherosclerois (ICD10-170.0)     CURRENT THERAPY: Gleevec 400 mg daily   INTERVAL HISTORY: Mr. Laski presents for unscheduled visit for abdominal pain. He noticed dull ache in left upper abdomen 3 weeks to 1 month ago that has been increasing. Pain is intermittent. This morning he was eating breakfast and pain became severe. He rates it 7/10. Occasionally radiates to his back. Nothing makes it worse or better. He has not changed his diet or any medication. He has a BM every other day with enema; constipation is chronic. Stools are not black or bloody. No nausea or vomiting. Drinks boost daily. No significant weight loss. Denies fever, cough, chest pain, dyspnea. He is occasionally dizzy with position change and on standing. He notes she is followed by wound clinic and pain specialist.    REVIEW OF SYSTEMS:   Constitutional: Denies fevers, chills or abnormal weight loss  Respiratory: Denies cough, dyspnea or wheezes Cardiovascular: Denies palpitation, chest discomfort or lower extremity swelling Gastrointestinal:  Denies nausea, vomiting, diarrhea, heartburn, GI bleeding, or change in bowel habits (+) chronic constipation (+) abdominal pain  LUQ,  increasing  Lymphatics: Denies new lymphadenopathy or easy bruising Neurological:Denies numbness, tingling or new weaknesses (+) positional dizziness  Behavioral/Psych: Mood is stable, no new changes  All other systems were reviewed with the patient and are negative.  MEDICAL HISTORY:  Past Medical History:  Diagnosis Date  . Anemia   . Asthma    AS A CHILD  . Blind left eye   . Cancer [facility])    PROSTATE CANCER  . Cellulitis    frequently, "all over"  . Chronic kidney disease   . Chronic pain syndrome   . Chronic ulcer of toe, right, with unspecified severity ([place])    2nd toe  . Constipation   . DDD (degenerative disc disease), lumbar   . Gastrointestinal stromal tumor (GIST) of stomach ([place])   . GI bleeding   . History of blood transfusion    01/2018  . Insomnia   . Kyphoscoliosis   . Mass in the abdomen 01/2018  . Restless leg   . Sleep apnea    does not wear CPAP  . Wears glasses     SURGICAL HISTORY: Past Surgical History:  Procedure Laterality Date  . AMPUTATION TOE Right 11/05/2018   Procedure: Right 2nd toe ampuation;  Surgeon: Wylene Simmer, MD;  Location: [facility];  Service: Orthopedics;  Laterality: Right;  11mn  . CHOLECYSTECTOMY    . COLONOSCOPY    . ESOPHAGOGASTRODUODENOSCOPY (EGD) WITH PROPOFOL Left 02/16/2018   Procedure: ESOPHAGOGASTRODUODENOSCOPY (EGD) WITH PROPOFOL;  Surgeon: KRonnette Juniper MD;  [place]  Service: Gastroenterology;  Laterality: Left;  . LAPAROSCOPIC GASTRECTOMY N/A 03/12/2018   Procedure: LAPAROSCOPIC ASSISTED PARTIAL GASTRECTOMY ERAS PATHWAY;  Surgeon: BStark Klein MD;  [place] [facility]  Service: General;  Laterality: N/A;  . LAPAROSCOPIC PARTIAL GASTRECTOMY  03/12/2018   LAPAROSCOPIC ASSISTED PARTIAL GASTRECTOMY ERAS PATHWAY  . PROSTATECTOMY    . QUADRICEPS TENDON REPAIR Left 07/07/2017   Procedure: REPAIR QUADRICEP TENDON;  Surgeon: SRod Can MD;  [place] [facility]  Service: Orthopedics;   Laterality: Left;  . RIGHT CATARACT EXTRACTION  2013  . RIGTH NEPHRECTOMY  2006  . TONSILLECTOMY    . TRANSURETHRAL RESECTION OF PROSTATE  1997    I have reviewed the social history and family history with the patient and they are unchanged from previous note.  ALLERGIES:  is allergic to coconut flavor [flavoring agent]; coconut oil; and food.  MEDICATIONS:  Current Outpatient Medications  Medication Sig Dispense Refill  . gabapentin (NEURONTIN) 300 MG capsule Take 1 capsule (300 mg total) by mouth 3 (three) times daily. (Patient taking differently: Take 300 mg by mouth See admin instructions. Take 300 mg by mouth two to three times a day)    . imatinib (GLEEVEC) 100 MG tablet TAKE 4 TABLETS ('400MG'$ ) BY MOUTH ONCE DAILY ON A FULL STOMACH AND A GLASS OF WATER. 120 tablet 0  . oxymorphone (OPANA) 10 MG tablet Take 10 mg by mouth every 6 (six) hours as needed for pain.     . ferrous sulfate 325 (65 FE) MG tablet Take 325 mg by mouth daily with breakfast.    . mirtazapine (REMERON) 45 MG tablet Take 45 mg by mouth at bedtime.    . pantoprazole (PROTONIX) 20 MG tablet Take 1 tablet (20 mg total) by mouth daily. 30 tablet 0  . polyethylene glycol (MIRALAX) 17 g packet Take 17 g by mouth daily as needed. 14 each 0   No current facility-administered medications for this visit.  PHYSICAL EXAMINATION: ECOG PERFORMANCE STATUS: 3 - Symptomatic, >50% confined to bed  Vitals:   04/07/19 1143  BP: (!) 126/56  Pulse: 66  Resp: 17  Temp: 99.6 F (37.6 C)  SpO2: 97%   Filed Weights   04/07/19 1143  Weight: 146 lb 8 oz (66.5 kg)    GENERAL:alert, no distress and comfortable. In wheelchair  SKIN: no obvious rash EYES: sclera clear LUNGS: with normal breathing effort HEART: regular rate & rhythm  ABDOMEN: decreased bowel sounds. Abdomen soft, slightly tender to LUQ deep palpation. No palpable mass  NEURO: alert & oriented x 3 with fluent speech, generalized weakness   LABORATORY DATA:   I have reviewed the data as listed CBC Latest Ref Rng & Units 04/07/2019 11/18/2018 11/11/2018  WBC 4.0 - 10.5 K/uL 4.4 4.6 4.2  Hemoglobin 13.0 - 17.0 g/dL 11.4(L) 12.3(L) 11.8(L)  Hematocrit 39.0 - 52.0 % 35.9(L) 38.1(L) 37.5(L)  Platelets 150 - 400 K/uL 108(L) 194 171     CMP Latest Ref Rng & Units 04/07/2019 11/18/2018 11/11/2018  Glucose 70 - 99 mg/dL 97 81 116(H)  BUN 8 - 23 mg/dL 24(H) 15 14  Creatinine 0.61 - 1.24 mg/dL 0.86 0.97 0.97  Sodium 135 - 145 mmol/L 139 135 137  Potassium 3.5 - 5.1 mmol/L 4.2 4.7 4.5  Chloride 98 - 111 mmol/L 106 102 102  CO2 22 - 32 mmol/L '28 27 29  '$ Calcium 8.9 - 10.3 mg/dL 8.0(L) 9.0 8.7(L)  Total Protein 6.5 - 8.1 g/dL 5.5(L) 6.6 6.1(L)  Total Bilirubin 0.3 - 1.2 mg/dL 0.4 0.5 0.4  Alkaline Phos 38 - 126 U/L 59 76 60  AST 15 - 41 U/L 40 36 27  ALT 0 - 44 U/L '25 25 16      '$ RADIOGRAPHIC STUDIES: I have personally reviewed the radiological images as listed and agreed with the findings in the report. Dg Abd 2 Views  Result Date: 04/07/2019 CLINICAL DATA:  85 year old male with left upper quadrant pain EXAM: ABDOMEN - 2 VIEW COMPARISON:  CT 11/18/2018 FINDINGS: Gas within stomach, small bowel, colon. No abnormal distention. No air-fluid levels. No free air identified on the decubitus images. Surgical changes of the right abdomen and the pelvis. Vascular calcifications. Degenerative scoliotic curvature of the lumbar spine. No displaced fracture. IMPRESSION: Nonobstructive bowel gas pattern. Surgical changes of the abdomen/pelvis. Vascular calcifications. Electronically Signed   By: Corrie Mckusick D.O.   On: 04/07/2019 17:27     ASSESSMENT & PLAN: 85 yo male   1. Acute LUQ abdominal pain  -increasing over last 3 weeks to 1 month, became severe this morning after eating breakfast -he is having bowel movements with enema every other day, no vomiting  -WBC is normal, no fever  -due to pain and low bowel sounds we obtained ABD xray which is negative for  obstruction.  -will proceed with CT AP to rule out cancer recurrence, orders placed  -in the mean time he can try protonix daily, I sent prescription -the patient was seen by Dr. Burr Medico as well  2. Gastrointestinal Stromal Tumor (GIST) of the lesser curvature of the stomach, 13.8 cm, pT4pNX, G2, mitotic rate >20/50 high power field, stage IIIB -diagnosed in 01/2018 s/p resection. 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 Hypotension, dizziness  -BP 126/56, he is not on anti-hypertensive medication -he notes being dizzy with position changes and while getting onto exam table today -did not attempt orthostatic BP due to generalized weakness today -he will get 1 liter IVF over 2 hours, I encouraged him to drink more fluids.   3. Chronic constipation  -for approximately 1 year -likely related to chronic opioid use  -he does not tolerate senokot well, today she was switched to miralax  -BM today  4. Anemia, iron deficiency  -previously on iron but not in the last month -Hgb 11.4 -iron studies in normal range, he can remain off iron for now   5. Decreased appetite  -he was prescribed mirtazapine but did not notice benefit, he did no refill his prescription -drinks 1 boost per day -no significant weight change from last f/u in 09/2018 -monitoring   6. Chronic back pain, skin wound -followed by pain team and wound care  -on oxymorphone   PLAN: -ABD xray today, negative for obstruction -IVF today for low BP and positional dizziness  -CT AP 04/14/19 -f/u few days after CT for results  -Rx: protonix and miralax  -I spoke to his son [name] to update him    Orders Placed This Encounter  Procedures  . DG Abd 2 Views     Standing Status:   Future    Number of Occurrences:   1    Standing Expiration Date:   04/06/2020    Order Specific Question:   Reason for Exam (SYMPTOM  OR DIAGNOSIS REQUIRED)    Answer:   abdominal pain, decreased bowel sounds    Order Specific Question:   Preferred imaging location?    Answer:   [facility]    Order Specific Question:   Radiology Contrast Protocol - do NOT remove file path    Answer:   \\[email or web address]  . CT Abdomen Pelvis W Contrast    Standing Status:   Future    Standing Expiration Date:   04/06/2020    Order Specific Question:   ** REASON FOR EXAM (FREE TEXT)    Answer:   GIST s/p resection, on Gleevec    Order Specific Question:   If indicated for the ordered procedure, I authorize the administration of contrast media per Radiology protocol    Answer:   Yes    Order Specific Question:   Preferred imaging location?    Answer:   [facility]    Order Specific Question:   Is Oral Contrast requested for this exam?    Answer:   Yes, Per Radiology protocol    Order Specific Question:   Radiology Contrast Protocol - do NOT remove file path    Answer:   \\[email or web address]   All questions were answered. The patient knows to call the clinic with any problems, questions or concerns. No barriers to learning was detected.    Alla Feeling, NP 04/08/19    Addendum  I have seen the patient, examined him. I agree with the assessment and and plan and have edited the notes.   Mr. Quant has developed significant abdominal pain for the past 3 weeks, due to his high risk for GIST recurrence, will obtain a CT abdomen for further evaluation. Agree with the supportive care.   Truitt Merle  04/08/2019

## 2019-04-07 NOTE — Patient Instructions (Signed)
Dehydration, Adult  Dehydration is a condition in which there is not enough fluid or water in the body. This happens when you lose more fluids than you take in. Important organs, such as the kidneys, brain, and heart, cannot function without a proper amount of fluids. Any loss of fluids from the body can lead to dehydration. Dehydration can range from mild to severe. This condition should be treated right away to prevent it from becoming severe. What are the causes? This condition may be caused by:  Vomiting.  Diarrhea.  Excessive sweating, such as from heat exposure or exercise.  Not drinking enough fluid, especially: ? When ill. ? While doing activity that requires a lot of energy.  Excessive urination.  Fever.  Infection.  Certain medicines, such as medicines that cause the body to lose excess fluid (diuretics).  Inability to access safe drinking water.  Reduced physical ability to get adequate water and food. What increases the risk? This condition is more likely to develop in people:  Who have a poorly controlled long-term (chronic) illness, such as diabetes, heart disease, or kidney disease.  Who are age 65 or older.  Who are disabled.  Who live in a place with high altitude.  Who play endurance sports. What are the signs or symptoms? Symptoms of mild dehydration may include:  Thirst.  Dry lips.  Slightly dry mouth.  Dry, warm skin.  Dizziness. Symptoms of moderate dehydration may include:  Very dry mouth.  Muscle cramps.  Dark urine. Urine may be the color of tea.  Decreased urine production.  Decreased tear production.  Heartbeat that is irregular or faster than normal (palpitations).  Headache.  Light-headedness, especially when you stand up from a sitting position.  Fainting (syncope). Symptoms of severe dehydration may include:  Changes in skin, such as: ? Cold and clammy skin. ? Blotchy (mottled) or pale skin. ? Skin that does  not quickly return to normal after being lightly pinched and released (poor skin turgor).  Changes in body fluids, such as: ? Extreme thirst. ? No tear production. ? Inability to sweat when body temperature is high, such as in hot weather. ? Very little urine production.  Changes in vital signs, such as: ? Weak pulse. ? Pulse that is more than 100 beats a minute when sitting still. ? Rapid breathing. ? Low blood pressure.  Other changes, such as: ? Sunken eyes. ? Cold hands and feet. ? Confusion. ? Lack of energy (lethargy). ? Difficulty waking up from sleep. ? Short-term weight loss. ? Unconsciousness. How is this diagnosed? This condition is diagnosed based on your symptoms and a physical exam. Blood and urine tests may be done to help confirm the diagnosis. How is this treated? Treatment for this condition depends on the severity. Mild or moderate dehydration can often be treated at home. Treatment should be started right away. Do not wait until dehydration becomes severe. Severe dehydration is an emergency and it needs to be treated in a hospital. Treatment for mild dehydration may include:  Drinking more fluids.  Replacing salts and minerals in your blood (electrolytes) that you may have lost. Treatment for moderate dehydration may include:  Drinking an oral rehydration solution (ORS). This is a drink that helps you replace fluids and electrolytes (rehydrate). It can be found at pharmacies and retail stores. Treatment for severe dehydration may include:  Receiving fluids through an IV tube.  Receiving an electrolyte solution through a feeding tube that is passed through your nose and   into your stomach (nasogastric tube, or NG tube).  Correcting any abnormalities in electrolytes.  Treating the underlying cause of dehydration. Follow these instructions at home:  If directed by your health care provider, drink an ORS: ? Make an ORS by following instructions on the  package. ? Start by drinking small amounts, about  cup (120 mL) every 5-10 minutes. ? Slowly increase how much you drink until you have taken the amount recommended by your health care provider.  Drink enough clear fluid to keep your urine clear or pale yellow. If you were told to drink an ORS, finish the ORS first, then start slowly drinking other clear fluids. Drink fluids such as: ? Water. Do not drink only water. Doing that can lead to having too little salt (sodium) in the body (hyponatremia). ? Ice chips. ? Fruit juice that you have added water to (diluted fruit juice). ? Low-calorie sports drinks.  Avoid: ? Alcohol. ? Drinks that contain a lot of sugar. These include high-calorie sports drinks, fruit juice that is not diluted, and soda. ? Caffeine. ? Foods that are greasy or contain a lot of fat or sugar.  Take over-the-counter and prescription medicines only as told by your health care provider.  Do not take sodium tablets. This can lead to having too much sodium in the body (hypernatremia).  Eat foods that contain a healthy balance of electrolytes, such as bananas, oranges, potatoes, tomatoes, and spinach.  Keep all follow-up visits as told by your health care provider. This is important. Contact a health care provider if:  You have abdominal pain that: ? Gets worse. ? Stays in one area (localizes).  You have a rash.  You have a stiff neck.  You are more irritable than usual.  You are sleepier or more difficult to wake up than usual.  You feel weak or dizzy.  You feel very thirsty.  You have urinated only a small amount of very dark urine over 6-8 hours. Get help right away if:  You have symptoms of severe dehydration.  You cannot drink fluids without vomiting.  Your symptoms get worse with treatment.  You have a fever.  You have a severe headache.  You have vomiting or diarrhea that: ? Gets worse. ? Does not go away.  You have blood or green matter  (bile) in your vomit.  You have blood in your stool. This may cause stool to look black and tarry.  You have not urinated in 6-8 hours.  You faint.  Your heart rate while sitting still is over 100 beats a minute.  You have trouble breathing. This information is not intended to replace advice given to you by your health care provider. Make sure you discuss any questions you have with your health care provider. Document Released: 11/18/2005 Document Revised: 06/14/2016 Document Reviewed: 01/12/2016 Elsevier Interactive Patient Education  2019 [employer]  

## 2019-04-07 NOTE — Telephone Encounter (Signed)
Patient's son has called stating his father is having severe abdominal pain and requesting he be seen.  Scheduled him with Cira Rue NP today 11:15 labs and 11:45 f/u with Lacie.

## 2019-04-08 ENCOUNTER — Telehealth: Payer: Self-pay | Admitting: Nurse Practitioner

## 2019-04-08 ENCOUNTER — Encounter: Payer: Self-pay | Admitting: Nurse Practitioner

## 2019-04-08 ENCOUNTER — Telehealth: Payer: Self-pay

## 2019-04-08 NOTE — Telephone Encounter (Signed)
Per Casey Gates scheduled patients CT scan for next week Wednesday 04/14/19 at [facility] [address]. Appointment is at 11am but patient is to arrive by 10:45am Pt asked for me to call son Casey Gates [phone]) to let him know of appointment date and time. Son aware of Appointment and that patient should not eat 4 hours before appointment. Son also stated that he would come pick up contrast from cancer center today. No further problems or concerns at this time.

## 2019-04-08 NOTE — Telephone Encounter (Signed)
No los per 5/6.

## 2019-04-09 ENCOUNTER — Telehealth: Payer: Self-pay | Admitting: Nurse Practitioner

## 2019-04-09 ENCOUNTER — Encounter (HOSPITAL_BASED_OUTPATIENT_CLINIC_OR_DEPARTMENT_OTHER): Payer: Medicare Other | Attending: Internal Medicine

## 2019-04-09 DIAGNOSIS — L89154 Pressure ulcer of sacral region, stage 4: Secondary | ICD-10-CM | POA: Diagnosis not present

## 2019-04-09 DIAGNOSIS — Z9221 Personal history of antineoplastic chemotherapy: Secondary | ICD-10-CM | POA: Diagnosis not present

## 2019-04-09 NOTE — Telephone Encounter (Signed)
Scheduled appt per sch msg. Called son, left msg about appt date and time

## 2019-04-12 ENCOUNTER — Telehealth: Payer: Self-pay

## 2019-04-12 NOTE — Telephone Encounter (Signed)
Spoke with Katharine Look from [place] @ in regards to Pantoprozole(Protonix) causing a rash. Per Cira Rue NP ok to stop taking medication if it's causing a rash. Nurse verbalized understanding.

## 2019-04-14 ENCOUNTER — Ambulatory Visit (HOSPITAL_COMMUNITY)
Admission: RE | Admit: 2019-04-14 | Discharge: 2019-04-14 | Disposition: A | Discharge status: Home / Self Care | Payer: Medicare Other | Source: Ambulatory Visit | Attending: Nurse Practitioner | Admitting: Nurse Practitioner

## 2019-04-14 ENCOUNTER — Other Ambulatory Visit: Payer: Self-pay

## 2019-04-14 ENCOUNTER — Other Ambulatory Visit: Payer: Self-pay | Admitting: Hematology

## 2019-04-14 DIAGNOSIS — C49A2 Gastrointestinal stromal tumor of stomach: Secondary | ICD-10-CM

## 2019-04-14 DIAGNOSIS — R1012 Left upper quadrant pain: Secondary | ICD-10-CM | POA: Diagnosis not present

## 2019-04-14 MED ORDER — IOHEXOL 300 MG/ML  SOLN
100.0000 mL | Freq: Once | INTRAMUSCULAR | Status: AC | PRN
  Administered 2019-04-14: 100 mL via INTRAVENOUS

## 2019-04-14 MED ORDER — SODIUM CHLORIDE (PF) 0.9 % IJ SOLN
INTRAMUSCULAR | Status: AC
  Filled 2019-04-14: qty 50

## 2019-04-15 ENCOUNTER — Telehealth: Payer: Self-pay | Admitting: Nurse Practitioner

## 2019-04-15 ENCOUNTER — Inpatient Hospital Stay: Payer: Medicare Other | Admitting: Nurse Practitioner

## 2019-04-15 NOTE — Telephone Encounter (Signed)
I called the patient this morning for an update on his condition. The patient reports he is doing "pretty good." Abdominal pain has subsided, he has not had any in several days. He has BM every other day with enema. He tried miralax which did not help. He is able to eat and drink, he thinks his intake is adequate. He took 2 doses of pantoprazole and developed a rash. He has stopped for 1 week now and rash is improving. I spoke to his son Casey Gates (on information release form) separately who also thinks the pain is gone. He believes it was related to gas. I reviewed CT AP with the patient and his son which is negative for cancer recurrence or metastatic disease which is good news. I recommend to continue Gleevec 400 mg daily, patient agrees. I offered referral to GI, he declined at this time. I strongly encouraged him to call back if his pain returns. We can consider referral at that time. He is followed by wound care for bed sore 3 times per week. Patient states his BP is checked regularly by home health service. I encouraged this to continue. He verbalizes understanding of the above plan and appreciates the call. Will cancel today's appointment. He is already scheduled for f/u in July, pt's son prefers to keep this as is. They know to call for in-person visit sooner if needed. I discussed the plan with Dr. Burr Medico who agrees.  Cira Rue, NP  04/15/2019

## 2019-04-19 ENCOUNTER — Ambulatory Visit: Payer: Medicare Other | Admitting: Podiatry

## 2019-04-19 MED FILL — IMATINIB MESYLATE 100 MG TA: 100 | 30 days supply | Qty: 120 | Fill #0

## 2019-04-20 IMAGING — CT CT ABD-PELV W/ CM
3 of 12 series · 12 of 46 positions shown, 16 images · IV contrast (Omni 300)
Comparison: Chest, abdomen, pelvis CT 03/18/2018. Abdominal
radiograph earlier this day

CLINICAL DATA: Shortness of breath. Generalized abdominal pain.
Recent abdominal surgery.

EXAM:
CT ANGIOGRAPHY CHEST
CT ABDOMEN AND PELVIS WITH CONTRAST
TECHNIQUE: Multidetector CT imaging of the chest was performed using the
standard protocol during bolus administration of intravenous
contrast. Multiplanar CT image reconstructions and MIPs were
obtained to evaluate the vascular anatomy. Multidetector CT imaging
of the abdomen and pelvis was performed using the standard protocol
during bolus administration of intravenous contrast.
CONTRAST:  100mL N6V5JX-13N IOPAMIDOL (N6V5JX-13N) INJECTION 76%

[Series 8: pe thins · axial · 0.75mm/px · z∈[-140,+98]mm · 8 of 286 slices shown]
[im 24/286  soft-tissue]
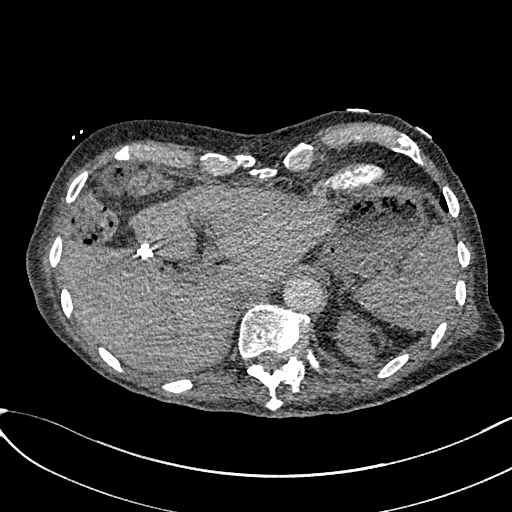
[im 72/286  soft-tissue]
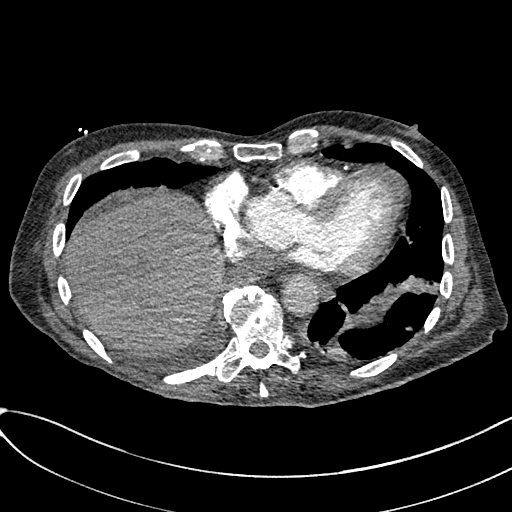
[im 96/286  soft-tissue]
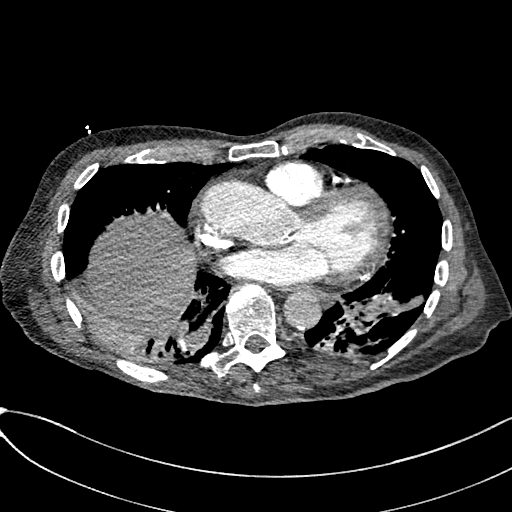
[im 119/286  soft-tissue]
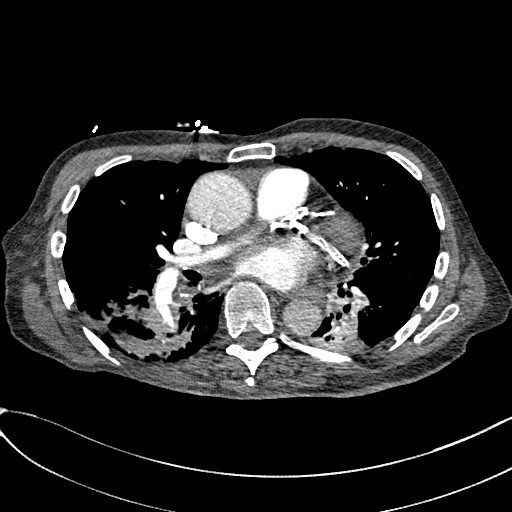
[im 167/286  soft-tissue]
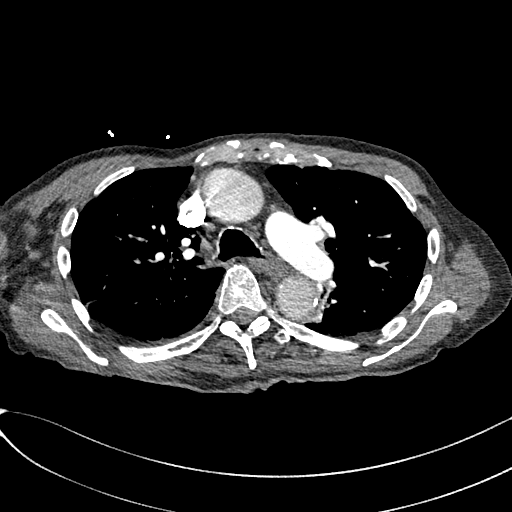
[im 191/286  soft-tissue]
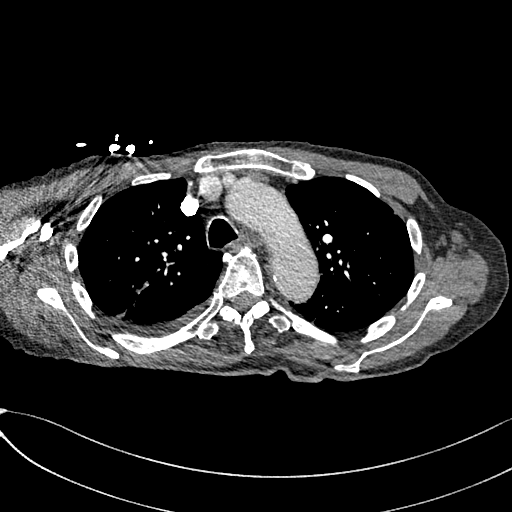
[im 214/286  soft-tissue]
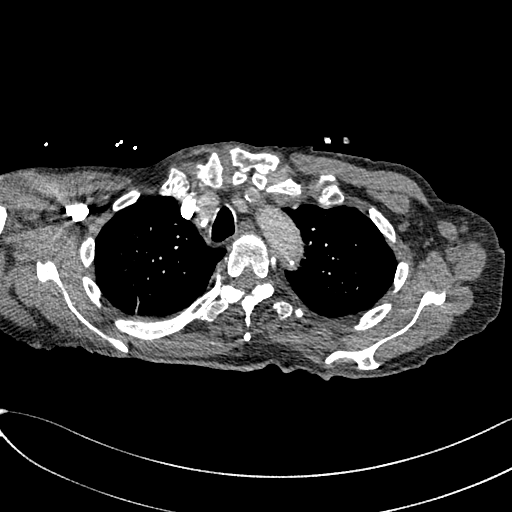
[im 262/286  soft-tissue]
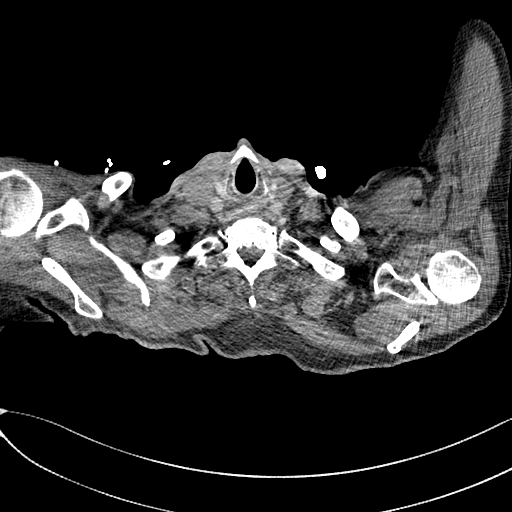

[Series 13: a/p w/ 5mm · axial · 0.93mm/px · z∈[-363,-198]mm · 2 of 99 slices shown, 5 images]
[im 33/99  soft-tissue]
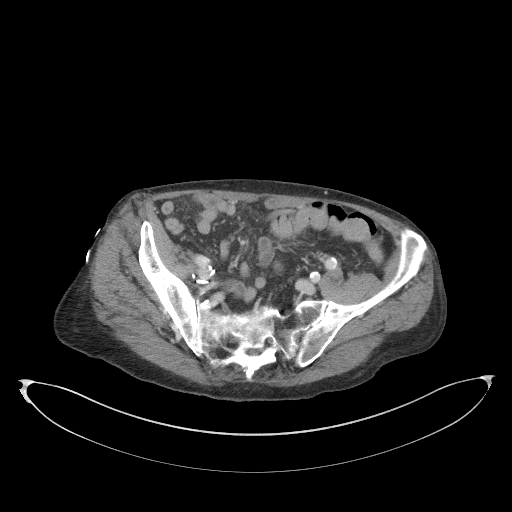
[im 33/99  lung]
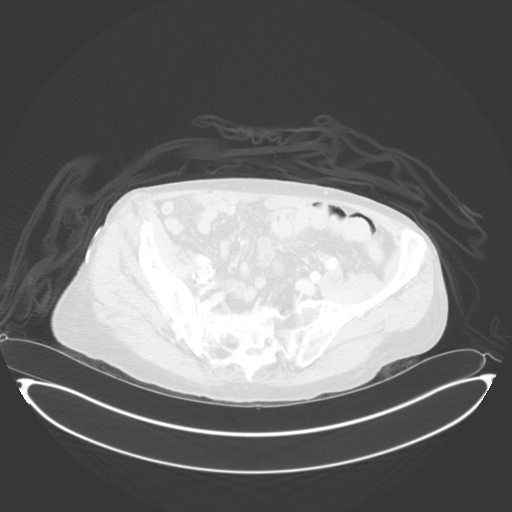
[im 33/99  bone]
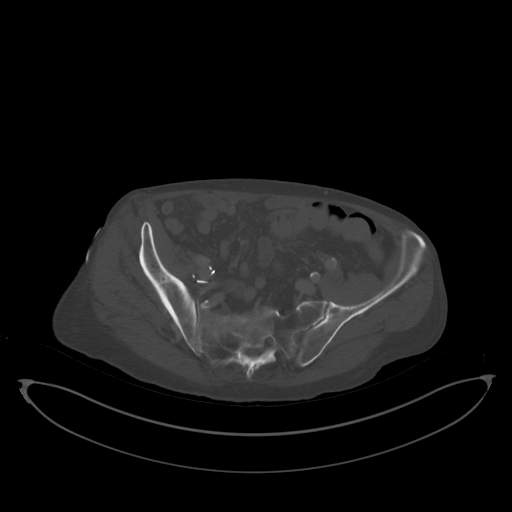
[im 66/99  soft-tissue]
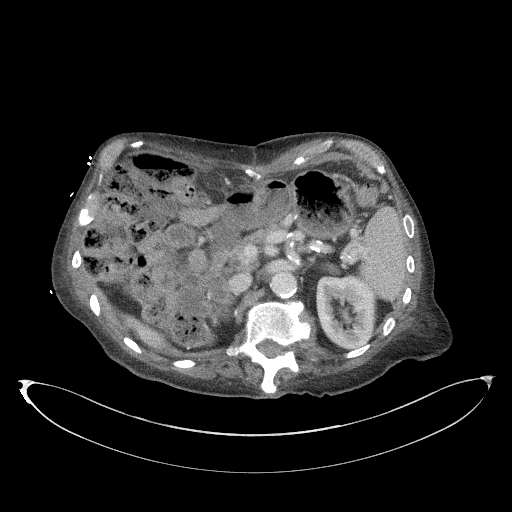
[im 66/99  lung]
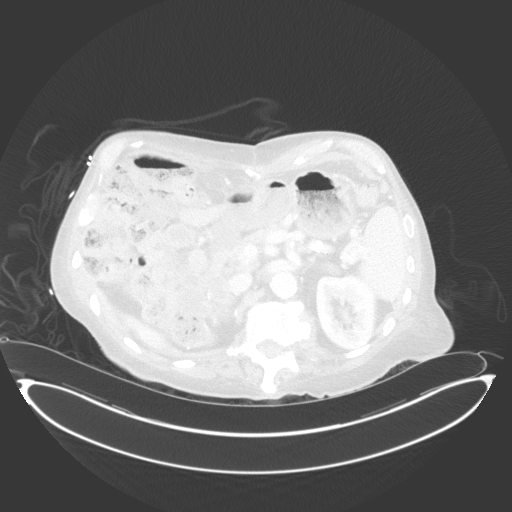

[Series 16: a/p w/ cor · coronal · 0.84mm/px · 2 of 115 slices shown, 3 images]
[im 39/115  soft-tissue]
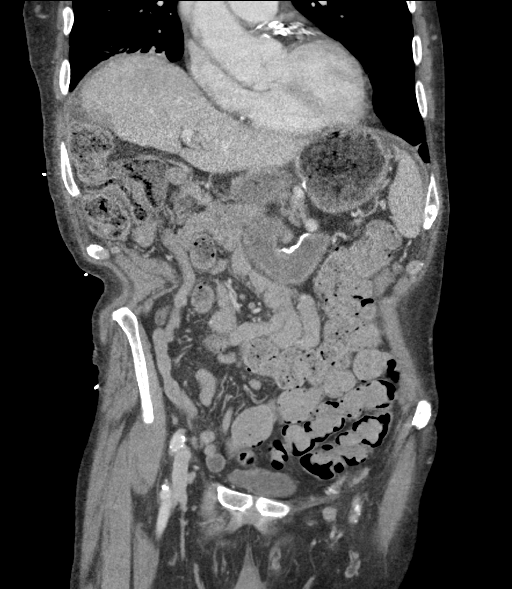
[im 39/115  bone]
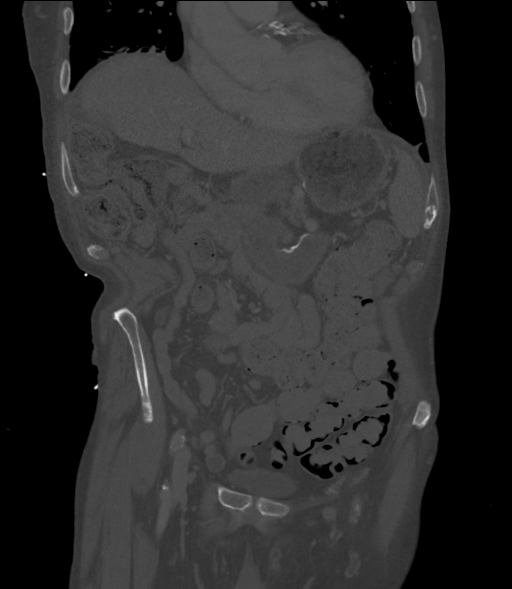
[im 77/115  soft-tissue]
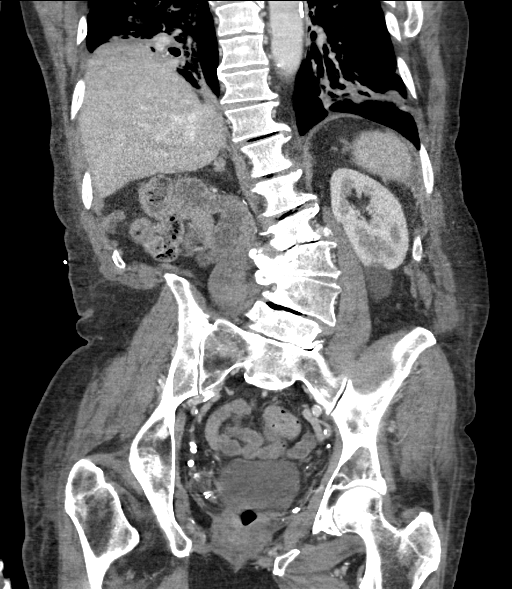

[12 of 46 positions shown; findings below may reference images not displayed]

FINDINGS: CTA CHEST FINDINGS

Cardiovascular: There are no filling defects within the pulmonary
arteries to suggest pulmonary embolus. Tortuous atherosclerotic
thoracic aorta without dissection. Coronary artery calcifications or
stents. No pericardial effusion.

Mediastinum/Nodes: Small mediastinal lymph nodes not enlarged by
size criteria. Upper esophagus minimally patulous. Visualized
thyroid gland is normal.

Lungs/Pleura: Debris within the right lower lobe bronchus leads to
short-segment bronchial filling and associated right lower lobe
opacities. Left lower lobe opacities with bronchiectasis, with
slight improvement from prior exam. Few patchy and ground-glass
opacities in the right upper lobe, with improvement from prior exam.
Right pleural effusion has diminished, small volume of pleural fluid
persists. Few calcifications in the consolidated right lower lobe
appear chronic.

Musculoskeletal: Scoliosis and degenerative change in the spine.
There are no acute or suspicious osseous abnormalities.

Review of the MIP images confirms the above findings.

CT ABDOMEN and PELVIS FINDINGS

Hepatobiliary: No focal hepatic lesion. Clips in the gallbladder
fossa postcholecystectomy. No biliary dilatation.

Pancreas: Parenchymal atrophy. No ductal dilatation or inflammation.

Spleen: Scattered calcified granuloma.  Normal in size.

Adrenals/Urinary Tract: Prior right nephrectomy. Normal right
adrenal gland. Low-density 16 mm left adrenal nodule. No left
hydronephrosis. Parapelvic and simple cysts in the left kidney.
Urinary bladder is partially distended, no bladder wall thickening.

Stomach/Bowel: Partial gastrectomy, stomach physiologically
distended. No evidence of bowel obstruction, wall thickening or
inflammatory change. Moderate colonic stool burden. Mild
diverticulosis of the sigmoid colon without diverticulitis. Appendix
is not confidently visualized on the current exam. No findings to
suggest appendicitis.

Vascular/Lymphatic: Aortic atherosclerosis and tortuosity. No
aneurysm. Mesenteric vessels are patent. No enlarged abdominal or
pelvic lymph nodes, paucity of intra-abdominal fat partially limits
assessment.

Reproductive: Prior prostatectomy.

Other: No abscess, free air free fluid. Prior free air in the
abdomen has resolved. Postsurgical change of the anterior abdominal
wall.

Musculoskeletal: Scoliosis and multilevel degenerative change
throughout spine. No acute osseous abnormality.

Review of the MIP images confirms the above findings.
IMPRESSION: CTA chest:

1. No pulmonary embolus.
2. Debris within the right lower lobe bronchus with associated right
lower lobe opacity, this can be seen with aspiration.
3. Improved right upper lobe aeration from prior consistent with
resolving pneumonia. Near completely resolved right pleural
effusion.
4. Chronic bilateral lower lobe bronchiectasis. Bilateral lower lobe
airspace disease with some improvement from most recent prior.
5. Coronary artery calcifications. Aortic Atherosclerosis
(XCVTM-RXZ.Z).

CT abdomen/pelvis:

1. Large colonic stool burden, suggesting constipation. No bowel
obstruction.
2. Post recent partial gastrectomy without evident complication. No
intra-abdominal abscess.
3. Additional stable chronic findings as described. Aortic
Atherosclerosis (XCVTM-RXZ.Z).

## 2019-04-23 DIAGNOSIS — L89154 Pressure ulcer of sacral region, stage 4: Secondary | ICD-10-CM | POA: Diagnosis not present

## 2019-05-04 ENCOUNTER — Other Ambulatory Visit: Payer: Self-pay | Admitting: Nurse Practitioner

## 2019-05-04 DIAGNOSIS — R1012 Left upper quadrant pain: Secondary | ICD-10-CM

## 2019-05-07 ENCOUNTER — Encounter (HOSPITAL_BASED_OUTPATIENT_CLINIC_OR_DEPARTMENT_OTHER): Payer: Medicare Other | Attending: Internal Medicine

## 2019-05-07 DIAGNOSIS — Z9221 Personal history of antineoplastic chemotherapy: Secondary | ICD-10-CM | POA: Insufficient documentation

## 2019-05-07 DIAGNOSIS — L308 Other specified dermatitis: Secondary | ICD-10-CM | POA: Insufficient documentation

## 2019-05-07 DIAGNOSIS — L89154 Pressure ulcer of sacral region, stage 4: Secondary | ICD-10-CM | POA: Diagnosis present

## 2019-05-14 ENCOUNTER — Other Ambulatory Visit: Payer: Self-pay | Admitting: Pharmacist

## 2019-05-14 ENCOUNTER — Other Ambulatory Visit: Payer: Self-pay | Admitting: Hematology

## 2019-05-14 DIAGNOSIS — C49A2 Gastrointestinal stromal tumor of stomach: Secondary | ICD-10-CM

## 2019-05-14 MED ORDER — IMATINIB MESYLATE 100 MG PO TABS: ORAL_TABLET | ORAL | 2 refills | Status: DC

## 2019-05-14 MED ORDER — IMATINIB MESYLATE 100 MG PO TABS: 400.0000 mg | ORAL_TABLET | Freq: Every day | ORAL | 2 refills | Status: DC

## 2019-05-17 MED FILL — IMATINIB MESYLATE 100 MG TA: 100 | 30 days supply | Qty: 120 | Fill #0

## 2019-05-21 ENCOUNTER — Ambulatory Visit: Payer: Medicare Other | Admitting: Hematology

## 2019-05-21 ENCOUNTER — Other Ambulatory Visit: Payer: Medicare Other

## 2019-05-21 DIAGNOSIS — L89154 Pressure ulcer of sacral region, stage 4: Secondary | ICD-10-CM | POA: Diagnosis not present

## 2019-06-11 ENCOUNTER — Encounter (HOSPITAL_BASED_OUTPATIENT_CLINIC_OR_DEPARTMENT_OTHER): Payer: Medicare Other | Attending: Internal Medicine

## 2019-06-11 DIAGNOSIS — Z9221 Personal history of antineoplastic chemotherapy: Secondary | ICD-10-CM | POA: Insufficient documentation

## 2019-06-11 DIAGNOSIS — L89154 Pressure ulcer of sacral region, stage 4: Secondary | ICD-10-CM | POA: Diagnosis not present

## 2019-06-14 MED FILL — IMATINIB MESYLATE 100 MG TA: 100 | 30 days supply | Qty: 120 | Fill #1

## 2019-06-15 ENCOUNTER — Telehealth: Payer: Self-pay | Admitting: Hematology

## 2019-06-15 NOTE — Telephone Encounter (Signed)
YF out 7/20 moved appointments to 7/28. Per patient appointments confirmed with son. [name] [phone]).

## 2019-06-21 ENCOUNTER — Ambulatory Visit: Payer: Medicare Other | Admitting: Hematology

## 2019-06-21 ENCOUNTER — Other Ambulatory Visit: Payer: Medicare Other

## 2019-06-25 DIAGNOSIS — L89154 Pressure ulcer of sacral region, stage 4: Secondary | ICD-10-CM | POA: Diagnosis not present

## 2019-06-25 NOTE — Progress Notes (Signed)
[facility]   [phone] [phone]   Clinic Follow up Note   Patient Care Team: Velna Hatchet, MD as PCP - General (Internal Medicine) [name], MD as PCP - Cardiology (Cardiology)  Date of Service:  06/29/2019  CHIEF COMPLAINT: F/u of GIST  SUMMARY OF ONCOLOGIC HISTORY: Oncology History Overview Note  Cancer Staging Malignant gastrointestinal stromal tumor (GIST) of stomach ([place]) Staging form: Gastrointestinal Stromal Tumor - Gastric and Omental GIST, AJCC 8th Edition - Pathologic stage from 04/22/2018: Stage IIIB (pT4, pN0, cM0, Mitotic Rate: High) - Signed by Alla Feeling, NP on 04/22/2018     Malignant gastrointestinal stromal tumor (GIST) of stomach ([place])  02/16/2018 Procedure   Upper EGD per Dr. [name] Impression:  - Normal upper third of esophagus and middle third of esophagus. - 5 cm hiatal hernia. - Widely patent Schatzki ring. - Clotted blood in the gastric body. - Rule out malignancy, gastric tumor on the lesser curvature of the stomach. Injected. Clips were placed. - Normal examined duodenum. - No specimens collected.   02/17/2018 Imaging   CT AP IMPRESSION: 1. 9.7 x 8.8 x 10.9 cm aggressive appearing heterogeneously enhancing mass along the lesser curvature of the stomach, highly concerning for primary gastric neoplasm. This is intimately associated with the undersurface of the left lobe of the liver without definitive evidence of direct invasion (although superficial involvement of the hepatic capsule is suspected given the loss of the intervening fat plane). There is also an indeterminate left adrenal nodule measuring 2.0 x 1.4 cm, which could be metastatic. No other definite signs of metastatic disease elsewhere in the abdomen or pelvis. 2. Areas of cylindrical bronchiectasis and chronic scarring in the lower lobes of the lungs bilaterally. 3. Aortic atherosclerosis, in addition to left main and 3 vessel coronary  artery disease. 4. Right kidney is either surgically or congenitally absent, or severely atrophic. 5. Additional incidental findings, as above.   02/19/2018 Initial Biopsy   VIA CT BIOPSY: Diagnosis -  Soft Tissue Needle Core Biopsy, gastric - FINDINGS CONSISTENT WITH GASTROINTESTINAL STROMAL TUMOR (GIST). - SEE COMMENT.   02/19/2018 Pathology Results   Diagnosis 02/19/2018 Soft Tissue Needle Core Biopsy, gastric - FINDINGS CONSISTENT WITH GASTROINTESTINAL STROMAL TUMOR (GIST).   03/12/2018 Initial Diagnosis   Malignant gastrointestinal stromal tumor (GIST) of stomach ([place])   03/12/2018 Pathology Results   Surgical path: Diagnosis Stomach, resection for tumor, Lesser curvature - GASTROINTESTINAL STROMAL TUMOR, 13.8 CM.   03/18/2018 Imaging   CT CAP IMPRESSION: -obtained d/t post op fever, leukocytosis, and respiratory change   Moderate right pleural effusion and small left pleural effusion. Compressive atelectasis versus pneumonia in the lower lobes. Nodular airspace disease throughout much of the right upper lobe and right middle lobe concerning for pneumonia.  Cardiomegaly. Severe/diffuse coronary artery disease. Aortic atherosclerosis. No evidence of aortic aneurysm.  Postoperative changes from recent partial gastrectomy. Small amount of free air in the abdomen, likely related to post op state. No evidence of bowel obstruction.  Prior remote right nephrectomy and prostatectomy.   03/22/2018 Procedure   Thoracentesis  Diagnosis PLEURAL FLUID, RIGHT (SPECIMEN 1 OF 1, COLLECTED ON 03/22/2018): NO MALIGNANT CELLS IDENTIFIED.     04/22/2018 Cancer Staging   Staging form: Gastrointestinal Stromal Tumor - Gastric and Omental GIST, AJCC 8th Edition - Pathologic stage from 04/22/2018: Stage IIIB (pT4, pN0, cM0, Mitotic Rate: High) - Signed by Alla Feeling, NP on 04/22/2018   05/03/2018 Imaging   05/03/2018 CT Abdomen and  Pelvis W/ Contrast IMPRESSION: CTA chest:  1. No  pulmonary embolus. 2. Debris within the right lower lobe bronchus with associated right lower lobe opacity, this can be seen with aspiration. 3. Improved right upper lobe aeration from prior consistent with resolving pneumonia. Near completely resolved right pleural effusion. 4. Chronic bilateral lower lobe bronchiectasis. Bilateral lower lobe airspace disease with some improvement from most recent prior. 5. Coronary artery calcifications. Aortic Atherosclerosis (ICD10-I70.0).  CT abdomen/pelvis:  1. Large colonic stool burden, suggesting constipation. No bowel obstruction. 2. Post recent partial gastrectomy without evident complication. No intra-abdominal abscess. 3. Additional stable chronic findings as described. Aortic Atherosclerosis (ICD10-I70.0).   05/03/2018 Imaging   05/03/2018 CT Angio Chest PE W or WO Contrast IMPRESSION: CTA chest:  1. No pulmonary embolus. 2. Debris within the right lower lobe bronchus with associated right lower lobe opacity, this can be seen with aspiration. 3. Improved right upper lobe aeration from prior consistent with resolving pneumonia. Near completely resolved right pleural effusion. 4. Chronic bilateral lower lobe bronchiectasis. Bilateral lower lobe airspace disease with some improvement from most recent prior. 5. Coronary artery calcifications. Aortic Atherosclerosis (ICD10-I70.0).   05/03/2018 Imaging   05/03/2018 CT CAP IMPRESSION: CTA chest: 1. No pulmonary embolus. 2. Debris within the right lower lobe bronchus with associated right lower lobe opacity, this can be seen with aspiration. 3. Improved right upper lobe aeration from prior consistent with resolving pneumonia. Near completely resolved right pleural effusion. 4. Chronic bilateral lower lobe bronchiectasis. Bilateral lower lobe airspace disease with some improvement from most recent prior. 5. Coronary artery calcifications. Aortic Atherosclerosis (ICD10-I70.0).  CT  abdomen/pelvis: 1. Large colonic stool burden, suggesting constipation. No bowel obstruction. 2. Post recent partial gastrectomy without evident complication. No intra-abdominal abscess. 3. Additional stable chronic findings as described. Aortic Atherosclerosis (ICD10-I70.0).    05/26/2018 -  Chemotherapy   Adjuvant Gleevec, starting on 05/26/18 at 257m daily and gradually increased to 4058mdaily since 06/23/2018   11/19/2018 Imaging   CT CAP  IMPRESSION: 1. Stable exam. No new or progressive interval findings. No features to suggest metastatic disease in the chest, abdomen, or pelvis. 2. Bronchiectasis/scarring in the lung bases, similar to prior. 3. Stable small left adrenal nodule. 4.  Aortic Atherosclerois (ICD10-170.0)      CURRENT THERAPY:  Adjuvant Gleevec, startingon 6/25/19at 20070maily and gradually increased to 400m79mily since7/23/2019  INTERVAL HISTORY:  MillMarquavis Hannenhere for a follow up GIST. He presents to the clinic alone. He notes he is doing fair. He notes he only has occasional abdominal pain now. He notes his back pain is stable. He continues oxymorphone TID.  [place] tolerating well. He notes having constant constipation. He notes miralax and senna is too strong for him and will cause diarrhea. He has been using enemas. He feels he has lost weight. His son notes patient had 1 large bruise a month ago which has resolved. Patient feels down due to COVID isolation and concerns of catching COVID.    REVIEW OF SYSTEMS:   Constitutional: Denies fevers, chills or abnormal weight loss Eyes: Denies blurriness of vision Ears, nose, mouth, throat, and face: Denies mucositis or sore throat Respiratory: Denies cough, dyspnea or wheezes Cardiovascular: Denies palpitation, chest discomfort or lower extremity swelling Gastrointestinal:  Denies nausea, heartburn (+) constipation Skin: Denies abnormal skin rashes MSK: (+) chronic back  pain Lymphatics: Denies new lymphadenopathy or easy bruising Neurological:Denies numbness, tingling or new weaknesses Behavioral/Psych: Mood is stable, no  new changes  All other systems were reviewed with the patient and are negative.  MEDICAL HISTORY:  Past Medical History:  Diagnosis Date  . Anemia   . Asthma    AS A CHILD  . Blind left eye   . Cancer [facility])    PROSTATE CANCER  . Cellulitis    frequently, "all over"  . Chronic kidney disease   . Chronic pain syndrome   . Chronic ulcer of toe, right, with unspecified severity ([place])    2nd toe  . Constipation   . DDD (degenerative disc disease), lumbar   . Gastrointestinal stromal tumor (GIST) of stomach ([place])   . GI bleeding   . History of blood transfusion    01/2018  . Insomnia   . Kyphoscoliosis   . Mass in the abdomen 01/2018  . Restless leg   . Sleep apnea    does not wear CPAP  . Wears glasses     SURGICAL HISTORY: Past Surgical History:  Procedure Laterality Date  . AMPUTATION TOE Right 11/05/2018   Procedure: Right 2nd toe ampuation;  Surgeon: Wylene Simmer, MD;  Location: [facility];  Service: Orthopedics;  Laterality: Right;  82mn  . CHOLECYSTECTOMY    . COLONOSCOPY    . ESOPHAGOGASTRODUODENOSCOPY (EGD) WITH PROPOFOL Left 02/16/2018   Procedure: ESOPHAGOGASTRODUODENOSCOPY (EGD) WITH PROPOFOL;  Surgeon: KRonnette Juniper MD;  Location: [facility]  Service: Gastroenterology;  Laterality: Left;  . LAPAROSCOPIC GASTRECTOMY N/A 03/12/2018   Procedure: LAPAROSCOPIC ASSISTED PARTIAL GASTRECTOMY ERAS PATHWAY;  Surgeon: BStark Klein MD;  Location[place] [facility]  Service: General;  Laterality: N/A;  . LAPAROSCOPIC PARTIAL GASTRECTOMY  03/12/2018   LAPAROSCOPIC ASSISTED PARTIAL GASTRECTOMY ERAS PATHWAY  . PROSTATECTOMY    . QUADRICEPS TENDON REPAIR Left 07/07/2017   Procedure: REPAIR QUADRICEP TENDON;  Surgeon: SRod Can MD;  Location: [facility]  Service: Orthopedics;  Laterality: Left;  . RIGHT CATARACT  EXTRACTION  2013  . RIGTH NEPHRECTOMY  2006  . TONSILLECTOMY    . TRANSURETHRAL RESECTION OF PROSTATE  1997    I have reviewed the social history and family history with the patient and they are unchanged from previous note.  ALLERGIES:  is allergic to coconut flavor [flavoring agent]; coconut oil; and food.  MEDICATIONS:  Current Outpatient Medications  Medication Sig Dispense Refill  . gabapentin (NEURONTIN) 300 MG capsule Take 1 capsule (300 mg total) by mouth 3 (three) times daily. (Patient taking differently: Take 300 mg by mouth See admin instructions. Take 300 mg by mouth two to three times a day)    . imatinib (GLEEVEC) 100 MG tablet Take 4 tablets (400 mg total) by mouth daily. Take with meals and large glass of water.Caution:Chemotherapy 120 tablet 2  . oxymorphone (OPANA) 10 MG tablet Take 10 mg by mouth every 6 (six) hours as needed for pain.     . ferrous sulfate 325 (65 FE) MG tablet Take 325 mg by mouth daily with breakfast.    . mirtazapine (REMERON) 45 MG tablet Take 45 mg by mouth at bedtime.    . pantoprazole (PROTONIX) 20 MG tablet TAKE 1 TABLET(20 MG) BY MOUTH DAILY (Patient not taking: Reported on 06/29/2019) 90 tablet 0   No current facility-administered medications for this visit.     PHYSICAL EXAMINATION: ECOG PERFORMANCE STATUS: 3 - Symptomatic, >50% confined to bed  Vitals:   06/29/19 1216  BP: (!) 134/56  Pulse: 71  Resp: 17  Temp: 99.1 F (37.3 C)  SpO2: 97%  Filed Weights   06/29/19 1216  Weight: 144 lb 12.8 oz (65.7 kg)    GENERAL:alert, no distress and comfortable SKIN: skin color, texture, turgor are normal, no rashes or significant lesions EYES: normal, Conjunctiva are pink and non-injected, sclera clear  NECK: supple, thyroid normal size, non-tender, without nodularity LYMPH:  no palpable lymphadenopathy in the cervical, axillary  LUNGS: clear to auscultation and percussion with normal breathing effort HEART: regular rate & rhythm and  no murmurs and no lower extremity edema ABDOMEN:abdomen soft, non-tender and normal bowel sounds Musculoskeletal:no cyanosis of digits and no clubbing  NEURO: alert & oriented x 3 with fluent speech, no focal motor/sensory deficits Exam done in wheelchair today   LABORATORY DATA:  I have reviewed the data as listed CBC Latest Ref Rng & Units 06/29/2019 04/07/2019 11/18/2018  WBC 4.0 - 10.5 K/uL 7.4 4.4 4.6  Hemoglobin 13.0 - 17.0 g/dL 11.2(L) 11.4(L) 12.3(L)  Hematocrit 39.0 - 52.0 % 34.2(L) 35.9(L) 38.1(L)  Platelets 150 - 400 K/uL 112(L) 108(L) 194     CMP Latest Ref Rng & Units 06/29/2019 04/07/2019 11/18/2018  Glucose 70 - 99 mg/dL 97 97 81  BUN 8 - 23 mg/dL 23 24(H) 15  Creatinine 0.61 - 1.24 mg/dL 0.94 0.86 0.97  Sodium 135 - 145 mmol/L 138 139 135  Potassium 3.5 - 5.1 mmol/L 4.5 4.2 4.7  Chloride 98 - 111 mmol/L 104 106 102  CO2 22 - 32 mmol/L _0 Calcium 8.9 - 10.3 mg/dL 8.1(L) 8.0(L) 9.0  Total Protein 6.5 - 8.1 g/dL 5.5(L) 5.5(L) 6.6  Total Bilirubin 0.3 - 1.2 mg/dL 0.5 0.4 0.5  Alkaline Phos 38 - 126 U/L 54 59 76  AST 15 - 41 U/L 45(H) 40 36  ALT 0 - 44 U/L _1 RADIOGRAPHIC STUDIES: I have personally reviewed the radiological images as listed and agreed with the findings in the report. No results found.   ASSESSMENT & PLAN:  Casey Gates is a 85 y.o. male with   1. Gastrointestinal Stromal Tumor (GIST) of the lesser curvature of the stomach, 13.8 cm, pT4pNX, G2, mitotic rate >20/50 high power field, stage IIIB -He was diagnosed in 01/2018. He is s/p tumor resection.Pathology revealed a large 13.8 cm GIST in the stomach. IHC was performed and the tumor is positive with CD117 (C-Kit). Genomic testing showed Kit Exon 11 mutation/insertion, which predicts benefit of Gleevec  -He has been on Gleevec since 05/26/18, currently at 476m daily. Plan for 3 years.He is tolerating well -recent CT scan in 04/2019 showed NED  -Due to her advanced age,  comorbidities, and limited performance status, if he does have significant side effects from G[place] I have low threshold to stop it. -From a GIST standpoint he is clinically doing well. Labs reviewed, CBC and CMP WNL except Hg 11.2, PLT 112K, Ca 8.1, protein 5.5, AST 45. Iron panel normal. Physical exam (done in wheelchair) unremarkable.  -I discussed his mild anemia and thrombocytopenia is likely related to his Gleevec. I also recommend he start OTC calcium daily.  -He will continue Gleevecfor total of 3 years. Tolerating well -Next scan in Spring 2021.  -F/u in441month    2. Anemia, iron deficient -Hehas been treated with blood transfusion -Currently onoral iron therapy 1 tab daily beginning in 04/2018, continue daily.  -Hg at 11.2 today, Iron panel normal (06/29/19)  3. Chronic constipation, intermittent diarrhea -He is on chronic opioid use with oxymorphone for chronic back pain,  this is contributing to constipation. -He gives himself enemas weekly if no bowel movement in 3 days because miralax and senna is too strong and colase does not work for him.  -His PCP prescribed him Lynzest, he is not using it.   4.Low appetite, Depression -He remains to have low appetite but overall stable weight.  -I encouraged him to increase his nutritional supplement to BID -He stopped taking mirtazapine given he did not feel it was working for him.  -He feels stressed about social isolation from COVID-19 has him depressed and effecting his eating.  -He is also very concerned about contracting COVID-19. His living facility is taking precautions to limit exposure.   5. S/p right 2nd toe amputation, right lower leg infection -He completed antibiotics this week -right leg erythema still present -Will monitor.F/u with PCP  6. Chronic Back pain, Decubitus pressure ulcer  -He has been on long term oxymorphone as needed for his chronic back pain, which is the main factor limiting his  activities . He uses it TID now, no less than 5 hours a part.  -He had previous sore of sacrum recently. He was previously seen by wound nurse and wound clinic.    PLAN: -He is clinically doing well  -Continue Gleevec 400 mg daily, he is tolerating well. -Lab and f/u in1month -I called his son and updated him    No problem-specific Assessment & Plan notes found for this encounter.   No orders of the defined types were placed in this encounter.  All questions were answered. The patient knows to call the clinic with any problems, questions or concerns. No barriers to learning was detected. I spent 15 minutes counseling the patient face to face. The total time spent in the appointment was 20 minutes and more than 50% was on counseling and review of test results     YTruitt Merle MD 06/29/2019   I, AJoslyn Devon am acting as scribe for YTruitt Merle MD.   I have reviewed the above documentation for accuracy and completeness, and I agree with the above.

## 2019-06-29 ENCOUNTER — Other Ambulatory Visit: Payer: Self-pay

## 2019-06-29 ENCOUNTER — Inpatient Hospital Stay: Payer: Medicare Other | Attending: Hematology

## 2019-06-29 ENCOUNTER — Telehealth: Payer: Self-pay | Admitting: Hematology

## 2019-06-29 ENCOUNTER — Inpatient Hospital Stay (HOSPITAL_BASED_OUTPATIENT_CLINIC_OR_DEPARTMENT_OTHER): Payer: Medicare Other | Admitting: Hematology

## 2019-06-29 ENCOUNTER — Encounter: Payer: Self-pay | Admitting: Hematology

## 2019-06-29 VITALS — BP 134/56 | HR 71 | Temp 99.1°F | Resp 17 | Ht 71.0 in | Wt 144.8 lb

## 2019-06-29 DIAGNOSIS — R630 Anorexia: Secondary | ICD-10-CM | POA: Insufficient documentation

## 2019-06-29 DIAGNOSIS — K5900 Constipation, unspecified: Secondary | ICD-10-CM | POA: Insufficient documentation

## 2019-06-29 DIAGNOSIS — D509 Iron deficiency anemia, unspecified: Secondary | ICD-10-CM

## 2019-06-29 DIAGNOSIS — I2510 Atherosclerotic heart disease of native coronary artery without angina pectoris: Secondary | ICD-10-CM | POA: Insufficient documentation

## 2019-06-29 DIAGNOSIS — I700 Atherosclerosis of aorta: Secondary | ICD-10-CM

## 2019-06-29 DIAGNOSIS — N189 Chronic kidney disease, unspecified: Secondary | ICD-10-CM

## 2019-06-29 DIAGNOSIS — J45909 Unspecified asthma, uncomplicated: Secondary | ICD-10-CM

## 2019-06-29 DIAGNOSIS — Z79899 Other long term (current) drug therapy: Secondary | ICD-10-CM | POA: Insufficient documentation

## 2019-06-29 DIAGNOSIS — C49A2 Gastrointestinal stromal tumor of stomach: Secondary | ICD-10-CM | POA: Insufficient documentation

## 2019-06-29 DIAGNOSIS — F329 Major depressive disorder, single episode, unspecified: Secondary | ICD-10-CM | POA: Insufficient documentation

## 2019-06-29 DIAGNOSIS — G4730 Sleep apnea, unspecified: Secondary | ICD-10-CM

## 2019-06-29 DIAGNOSIS — I517 Cardiomegaly: Secondary | ICD-10-CM

## 2019-06-29 DIAGNOSIS — R197 Diarrhea, unspecified: Secondary | ICD-10-CM | POA: Diagnosis not present

## 2019-06-29 DIAGNOSIS — G4700 Insomnia, unspecified: Secondary | ICD-10-CM | POA: Insufficient documentation

## 2019-06-29 DIAGNOSIS — J90 Pleural effusion, not elsewhere classified: Secondary | ICD-10-CM | POA: Diagnosis not present

## 2019-06-29 DIAGNOSIS — M5136 Other intervertebral disc degeneration, lumbar region: Secondary | ICD-10-CM | POA: Insufficient documentation

## 2019-06-29 LAB — CBC WITH DIFFERENTIAL (CANCER CENTER ONLY)
Abs Immature Granulocytes: 0.02 10*3/uL (ref 0.00–0.07)
Basophils Absolute: 0 10*3/uL (ref 0.0–0.1)
Basophils Relative: 0 %
Eosinophils Absolute: 0.3 10*3/uL (ref 0.0–0.5)
Eosinophils Relative: 4 %
HCT: 34.2 % — ABNORMAL LOW (ref 39.0–52.0)
Hemoglobin: 11.2 g/dL — ABNORMAL LOW (ref 13.0–17.0)
Immature Granulocytes: 0 %
Lymphocytes Relative: 15 %
Lymphs Abs: 1.1 10*3/uL (ref 0.7–4.0)
MCH: 32.4 pg (ref 26.0–34.0)
MCHC: 32.7 g/dL (ref 30.0–36.0)
MCV: 98.8 fL (ref 80.0–100.0)
Monocytes Absolute: 0.7 10*3/uL (ref 0.1–1.0)
Monocytes Relative: 9 %
Neutro Abs: 5.3 10*3/uL (ref 1.7–7.7)
Neutrophils Relative %: 72 %
Platelet Count: 112 10*3/uL — ABNORMAL LOW (ref 150–400)
RBC: 3.46 MIL/uL — ABNORMAL LOW (ref 4.22–5.81)
RDW: 13.4 % (ref 11.5–15.5)
WBC Count: 7.4 10*3/uL (ref 4.0–10.5)
nRBC: 0 % (ref 0.0–0.2)

## 2019-06-29 LAB — IRON AND TIBC
Iron: 62 ug/dL (ref 42–163)
Saturation Ratios: 27 % (ref 20–55)
TIBC: 230 ug/dL (ref 202–409)
UIBC: 168 ug/dL (ref 117–376)

## 2019-06-29 LAB — CMP (CANCER CENTER ONLY)
ALT: 19 U/L (ref 0–44)
AST: 45 U/L — ABNORMAL HIGH (ref 15–41)
Albumin: 3.6 g/dL (ref 3.5–5.0)
Alkaline Phosphatase: 54 U/L (ref 38–126)
Anion gap: 5 (ref 5–15)
BUN: 23 mg/dL (ref 8–23)
CO2: 29 mmol/L (ref 22–32)
Calcium: 8.1 mg/dL — ABNORMAL LOW (ref 8.9–10.3)
Chloride: 104 mmol/L (ref 98–111)
Creatinine: 0.94 mg/dL (ref 0.61–1.24)
GFR, Est AFR Am: >60 mL/min (ref >60)
GFR, Estimated: >60 mL/min (ref >60)
Glucose, Bld: 97 mg/dL (ref 70–99)
Potassium: 4.5 mmol/L (ref 3.5–5.1)
Sodium: 138 mmol/L (ref 135–145)
Total Bilirubin: 0.5 mg/dL (ref 0.3–1.2)
Total Protein: 5.5 g/dL — ABNORMAL LOW (ref 6.5–8.1)

## 2019-06-29 LAB — FERRITIN: Ferritin: 55 ng/mL (ref 24–336)

## 2019-06-29 NOTE — Telephone Encounter (Signed)
Scheduled appt per 7/28 los. ° °Printed and mailed appt calendar. °

## 2019-07-14 MED FILL — IMATINIB MESYLATE 100 MG TA: 100 | 30 days supply | Qty: 120 | Fill #2

## 2019-08-06 ENCOUNTER — Ambulatory Visit (INDEPENDENT_AMBULATORY_CARE_PROVIDER_SITE_OTHER): Payer: Medicare Other | Admitting: Podiatry

## 2019-08-06 ENCOUNTER — Encounter: Payer: Self-pay | Admitting: Podiatry

## 2019-08-06 ENCOUNTER — Other Ambulatory Visit: Payer: Self-pay

## 2019-08-06 DIAGNOSIS — B351 Tinea unguium: Secondary | ICD-10-CM

## 2019-08-06 DIAGNOSIS — M79676 Pain in unspecified toe(s): Secondary | ICD-10-CM

## 2019-08-06 MED FILL — IMATINIB MESYLATE 100 MG TA: 100 | 30 days supply | Qty: 120 | Fill #0

## 2019-08-06 NOTE — Progress Notes (Signed)
Complaint:  Visit Type: Patient returns to my office for continued preventative foot care services. Complaint: Patient states" my nails have grown long and thick and become painful to walk and wear shoes"  The patient presents for preventative foot care services. No changes to ROS.Patient has amputated second toe right foot.    Podiatric Exam: Vascular: dorsalis pedis and posterior tibial pulses are palpable bilateral. Capillary return is immediate. Temperature gradient is WNL. Skin turgor WNL  Sensorium: Normal Semmes Weinstein monofilament test. Normal tactile sensation bilaterally. Nail Exam: Pt has thick disfigured discolored nails with subungual debris noted bilateral entire nail hallux through fifth toenails Ulcer Exam: There is no evidence of ulcer or pre-ulcerative changes or infection. Orthopedic Exam: Muscle tone and strength are WNL. No limitations in general ROM. No crepitus or effusions noted. Foot type and digits show no abnormalities. Bony prominences are unremarkable. Skin: No Porokeratosis. No infection or ulcers  Diagnosis:  Onychomycosis, , Pain in right toe, pain in left toes  Treatment & Plan Procedures and Treatment: Consent by patient was obtained for treatment procedures.   Debridement of mycotic and hypertrophic toenails, 1 through 5 bilateral and clearing of subungual debris. No ulceration, no infection noted.  Return Visit-Office Procedure: Patient instructed to return to the office for a follow up visit 3 months for continued evaluation and treatment.    Gardiner Barefoot DPM

## 2019-08-24 ENCOUNTER — Telehealth: Payer: Self-pay

## 2019-08-24 ENCOUNTER — Telehealth: Payer: Self-pay | Admitting: Hematology

## 2019-08-24 NOTE — Telephone Encounter (Signed)
Scheduled appt per 9/22 sch message - pt aware of appt date and time   

## 2019-08-24 NOTE — Telephone Encounter (Signed)
Patient's son called and left a voicemail stating that patient had notice blood in his underwear the past 2 mornings. Called son back to gather more information and see if patient would be interested in coming in to be seen by our PA in [facility]. Patient's son stated patient was not more fatigues, not SOB, and did not appear pale. Reported that patient had a BM yesterday and did not observe any blood in the stool or toilet bowl. As we talked it seemed that patient's son just wanted to make Dr. Burr Medico aware of the situation because he stated he didn't feel the patient needed to be seen in [facility], and that he planned to reach out to patient's PCP for a follow up visit. Son denied any other needs at this time and knows to call our office back if the patient's status changes.   Sent in-basket message to Dr. Burr Medico and Regan Rakers Burton-NP with information above.

## 2019-08-24 NOTE — Telephone Encounter (Signed)
Received phone call from [place] at [facility] that patient was had lab work done and was seen by [employer] CBC showed that patient's platelets were 31,000 and Mendel Ryder felt patient needed to be evaluated by Dr. Burr Medico ASAP. Fax number given to Anderson Malta to so patient's lab work can be sent over for Dr. Burr Medico to review. High priority scheduling message sent to add patient to either Dr. Burr Medico or Regan Rakers Burton-NP schedules.  Appointment made for 11:20 on 08/25/19. Son called with update and verbalized understanding and agreement of plan.

## 2019-08-25 ENCOUNTER — Inpatient Hospital Stay: Payer: Medicare Other

## 2019-08-25 ENCOUNTER — Other Ambulatory Visit: Payer: Self-pay

## 2019-08-25 ENCOUNTER — Inpatient Hospital Stay: Payer: Medicare Other | Attending: Hematology | Admitting: Hematology

## 2019-08-25 ENCOUNTER — Encounter: Payer: Self-pay | Admitting: Hematology

## 2019-08-25 ENCOUNTER — Ambulatory Visit: Payer: Medicare Other

## 2019-08-25 ENCOUNTER — Telehealth: Payer: Self-pay

## 2019-08-25 VITALS — BP 130/59 | HR 66 | Temp 98.9°F | Resp 16 | Ht 71.0 in | Wt 142.5 lb

## 2019-08-25 DIAGNOSIS — K5900 Constipation, unspecified: Secondary | ICD-10-CM | POA: Insufficient documentation

## 2019-08-25 DIAGNOSIS — D696 Thrombocytopenia, unspecified: Secondary | ICD-10-CM | POA: Diagnosis not present

## 2019-08-25 DIAGNOSIS — I2510 Atherosclerotic heart disease of native coronary artery without angina pectoris: Secondary | ICD-10-CM | POA: Diagnosis not present

## 2019-08-25 DIAGNOSIS — J45909 Unspecified asthma, uncomplicated: Secondary | ICD-10-CM | POA: Insufficient documentation

## 2019-08-25 DIAGNOSIS — D509 Iron deficiency anemia, unspecified: Secondary | ICD-10-CM | POA: Insufficient documentation

## 2019-08-25 DIAGNOSIS — M5136 Other intervertebral disc degeneration, lumbar region: Secondary | ICD-10-CM | POA: Diagnosis not present

## 2019-08-25 DIAGNOSIS — Z89421 Acquired absence of other right toe(s): Secondary | ICD-10-CM | POA: Insufficient documentation

## 2019-08-25 DIAGNOSIS — Z79891 Long term (current) use of opiate analgesic: Secondary | ICD-10-CM | POA: Insufficient documentation

## 2019-08-25 DIAGNOSIS — Z79899 Other long term (current) drug therapy: Secondary | ICD-10-CM | POA: Diagnosis not present

## 2019-08-25 DIAGNOSIS — R197 Diarrhea, unspecified: Secondary | ICD-10-CM | POA: Diagnosis not present

## 2019-08-25 DIAGNOSIS — F329 Major depressive disorder, single episode, unspecified: Secondary | ICD-10-CM | POA: Diagnosis not present

## 2019-08-25 DIAGNOSIS — C49A2 Gastrointestinal stromal tumor of stomach: Secondary | ICD-10-CM

## 2019-08-25 DIAGNOSIS — Z8546 Personal history of malignant neoplasm of prostate: Secondary | ICD-10-CM | POA: Insufficient documentation

## 2019-08-25 DIAGNOSIS — K5909 Other constipation: Secondary | ICD-10-CM | POA: Diagnosis not present

## 2019-08-25 DIAGNOSIS — G8929 Other chronic pain: Secondary | ICD-10-CM | POA: Diagnosis not present

## 2019-08-25 DIAGNOSIS — M549 Dorsalgia, unspecified: Secondary | ICD-10-CM | POA: Diagnosis not present

## 2019-08-25 DIAGNOSIS — I700 Atherosclerosis of aorta: Secondary | ICD-10-CM | POA: Insufficient documentation

## 2019-08-25 DIAGNOSIS — J479 Bronchiectasis, uncomplicated: Secondary | ICD-10-CM | POA: Insufficient documentation

## 2019-08-25 LAB — CMP (CANCER CENTER ONLY)
ALT: 16 U/L (ref 0–44)
AST: 43 U/L — ABNORMAL HIGH (ref 15–41)
Albumin: 3.7 g/dL (ref 3.5–5.0)
Alkaline Phosphatase: 51 U/L (ref 38–126)
Anion gap: 6 (ref 5–15)
BUN: 17 mg/dL (ref 8–23)
CO2: 27 mmol/L (ref 22–32)
Calcium: 8.4 mg/dL — ABNORMAL LOW (ref 8.9–10.3)
Chloride: 106 mmol/L (ref 98–111)
Creatinine: 0.97 mg/dL (ref 0.61–1.24)
GFR, Est AFR Am: >60 mL/min (ref >60)
GFR, Estimated: >60 mL/min (ref >60)
Glucose, Bld: 93 mg/dL (ref 70–99)
Potassium: 4.2 mmol/L (ref 3.5–5.1)
Sodium: 139 mmol/L (ref 135–145)
Total Bilirubin: 0.4 mg/dL (ref 0.3–1.2)
Total Protein: 5.5 g/dL — ABNORMAL LOW (ref 6.5–8.1)

## 2019-08-25 LAB — CBC WITH DIFFERENTIAL (CANCER CENTER ONLY)
Abs Immature Granulocytes: 0.01 10*3/uL (ref 0.00–0.07)
Basophils Absolute: 0 10*3/uL (ref 0.0–0.1)
Basophils Relative: 1 %
Eosinophils Absolute: 0.5 10*3/uL (ref 0.0–0.5)
Eosinophils Relative: 10 %
HCT: 35.1 % — ABNORMAL LOW (ref 39.0–52.0)
Hemoglobin: 11.3 g/dL — ABNORMAL LOW (ref 13.0–17.0)
Immature Granulocytes: 0 %
Lymphocytes Relative: 25 %
Lymphs Abs: 1.2 10*3/uL (ref 0.7–4.0)
MCH: 32.2 pg (ref 26.0–34.0)
MCHC: 32.2 g/dL (ref 30.0–36.0)
MCV: 100 fL (ref 80.0–100.0)
Monocytes Absolute: 0.6 10*3/uL (ref 0.1–1.0)
Monocytes Relative: 12 %
Neutro Abs: 2.5 10*3/uL (ref 1.7–7.7)
Neutrophils Relative %: 52 %
Platelet Count: 117 10*3/uL — ABNORMAL LOW (ref 150–400)
RBC: 3.51 MIL/uL — ABNORMAL LOW (ref 4.22–5.81)
RDW: 13.2 % (ref 11.5–15.5)
WBC Count: 4.7 10*3/uL (ref 4.0–10.5)
nRBC: 0 % (ref 0.0–0.2)

## 2019-08-25 LAB — FERRITIN: Ferritin: 70 ng/mL (ref 24–336)

## 2019-08-25 NOTE — Telephone Encounter (Signed)
Error

## 2019-08-25 NOTE — Progress Notes (Signed)
[facility]   Telephone:([phone]) [phone] Fax:(336) [phone]   Clinic Follow up Note   Patient Care Team: Velna Hatchet, MD as PCP - General (Internal Medicine) [name], MD as PCP - Cardiology (Cardiology)  Date of Service:  08/25/2019  CHIEF COMPLAINT: F/u of GIST  SUMMARY OF ONCOLOGIC HISTORY: Oncology History Overview Note  Cancer Staging Malignant gastrointestinal stromal tumor (GIST) of stomach ([place]) Staging form: Gastrointestinal Stromal Tumor - Gastric and Omental GIST, AJCC 8th Edition - Pathologic stage from 04/22/2018: Stage IIIB (pT4, pN0, cM0, Mitotic Rate: High) - Signed by Alla Feeling, NP on 04/22/2018     Malignant gastrointestinal stromal tumor (GIST) of stomach ([place])  02/16/2018 Procedure   Upper EGD per Dr. [name] Impression:  - Normal upper third of esophagus and middle third of esophagus. - 5 cm hiatal hernia. - Widely patent Schatzki ring. - Clotted blood in the gastric body. - Rule out malignancy, gastric tumor on the lesser curvature of the stomach. Injected. Clips were placed. - Normal examined duodenum. - No specimens collected.   02/17/2018 Imaging   CT AP IMPRESSION: 1. 9.7 x 8.8 x 10.9 cm aggressive appearing heterogeneously enhancing mass along the lesser curvature of the stomach, highly concerning for primary gastric neoplasm. This is intimately associated with the undersurface of the left lobe of the liver without definitive evidence of direct invasion (although superficial involvement of the hepatic capsule is suspected given the loss of the intervening fat plane). There is also an indeterminate left adrenal nodule measuring 2.0 x 1.4 cm, which could be metastatic. No other definite signs of metastatic disease elsewhere in the abdomen or pelvis. 2. Areas of cylindrical bronchiectasis and chronic scarring in the lower lobes of the lungs bilaterally. 3. Aortic atherosclerosis, in addition to left main and 3 vessel coronary  artery disease. 4. Right kidney is either surgically or congenitally absent, or severely atrophic. 5. Additional incidental findings, as above.   02/19/2018 Initial Biopsy   VIA CT BIOPSY: Diagnosis -  Soft Tissue Needle Core Biopsy, gastric - FINDINGS CONSISTENT WITH GASTROINTESTINAL STROMAL TUMOR (GIST). - SEE COMMENT.   02/19/2018 Pathology Results   Diagnosis 02/19/2018 Soft Tissue Needle Core Biopsy, gastric - FINDINGS CONSISTENT WITH GASTROINTESTINAL STROMAL TUMOR (GIST).   03/12/2018 Initial Diagnosis   Malignant gastrointestinal stromal tumor (GIST) of stomach ([place])   03/12/2018 Pathology Results   Surgical path: Diagnosis Stomach, resection for tumor, Lesser curvature - GASTROINTESTINAL STROMAL TUMOR, 13.8 CM.   03/18/2018 Imaging   CT CAP IMPRESSION: -obtained d/t post op fever, leukocytosis, and respiratory change   Moderate right pleural effusion and small left pleural effusion. Compressive atelectasis versus pneumonia in the lower lobes. Nodular airspace disease throughout much of the right upper lobe and right middle lobe concerning for pneumonia.  Cardiomegaly. Severe/diffuse coronary artery disease. Aortic atherosclerosis. No evidence of aortic aneurysm.  Postoperative changes from recent partial gastrectomy. Small amount of free air in the abdomen, likely related to post op state. No evidence of bowel obstruction.  Prior remote right nephrectomy and prostatectomy.   03/22/2018 Procedure   Thoracentesis  Diagnosis PLEURAL FLUID, RIGHT (SPECIMEN 1 OF 1, COLLECTED ON 03/22/2018): NO MALIGNANT CELLS IDENTIFIED.     04/22/2018 Cancer Staging   Staging form: Gastrointestinal Stromal Tumor - Gastric and Omental GIST, AJCC 8th Edition - Pathologic stage from 04/22/2018: Stage IIIB (pT4, pN0, cM0, Mitotic Rate: High) - Signed by Alla Feeling, NP on 04/22/2018   05/03/2018 Imaging   05/03/2018 CT Abdomen and  Pelvis W/ Contrast IMPRESSION: CTA chest:  1. No  pulmonary embolus. 2. Debris within the right lower lobe bronchus with associated right lower lobe opacity, this can be seen with aspiration. 3. Improved right upper lobe aeration from prior consistent with resolving pneumonia. Near completely resolved right pleural effusion. 4. Chronic bilateral lower lobe bronchiectasis. Bilateral lower lobe airspace disease with some improvement from most recent prior. 5. Coronary artery calcifications. Aortic Atherosclerosis (ICD10-I70.0).  CT abdomen/pelvis:  1. Large colonic stool burden, suggesting constipation. No bowel obstruction. 2. Post recent partial gastrectomy without evident complication. No intra-abdominal abscess. 3. Additional stable chronic findings as described. Aortic Atherosclerosis (ICD10-I70.0).   05/03/2018 Imaging   05/03/2018 CT Angio Chest PE W or WO Contrast IMPRESSION: CTA chest:  1. No pulmonary embolus. 2. Debris within the right lower lobe bronchus with associated right lower lobe opacity, this can be seen with aspiration. 3. Improved right upper lobe aeration from prior consistent with resolving pneumonia. Near completely resolved right pleural effusion. 4. Chronic bilateral lower lobe bronchiectasis. Bilateral lower lobe airspace disease with some improvement from most recent prior. 5. Coronary artery calcifications. Aortic Atherosclerosis (ICD10-I70.0).   05/03/2018 Imaging   05/03/2018 CT CAP IMPRESSION: CTA chest: 1. No pulmonary embolus. 2. Debris within the right lower lobe bronchus with associated right lower lobe opacity, this can be seen with aspiration. 3. Improved right upper lobe aeration from prior consistent with resolving pneumonia. Near completely resolved right pleural effusion. 4. Chronic bilateral lower lobe bronchiectasis. Bilateral lower lobe airspace disease with some improvement from most recent prior. 5. Coronary artery calcifications. Aortic Atherosclerosis (ICD10-I70.0).  CT  abdomen/pelvis: 1. Large colonic stool burden, suggesting constipation. No bowel obstruction. 2. Post recent partial gastrectomy without evident complication. No intra-abdominal abscess. 3. Additional stable chronic findings as described. Aortic Atherosclerosis (ICD10-I70.0).    05/26/2018 -  Chemotherapy   Adjuvant Gleevec, starting on 05/26/18 at '200mg'$  daily and gradually increased to '400mg'$  daily since 06/23/2018   11/19/2018 Imaging   CT CAP  IMPRESSION: 1. Stable exam. No new or progressive interval findings. No features to suggest metastatic disease in the chest, abdomen, or pelvis. 2. Bronchiectasis/scarring in the lung bases, similar to prior. 3. Stable small left adrenal nodule. 4.  Aortic Atherosclerois (ICD10-170.0)      CURRENT THERAPY:  Adjuvant Gleevec, startingon 6/25/19at '200mg'$  daily and gradually increased to '400mg'$  daily since7/23/2019. Held in 08/2019 given thrombocytopenia and GI bleeding.   INTERVAL HISTORY:  Casey Gates is here for a follow up. His son was called to be included in the visit today. He notes his stomach pain improved today and has no GI bleeding on pad today. His son is concerned about recent GI bleeding on his underwear pad mildly. He denies any other bleeding or bruising. He is still in independent living where he can live by himself and they will prepare meals for him and deliver it to him due to COVID-19. He notes he has constant constipation. He has had to take oral laxative Movantik everyday recently. This upsets his stomach. His PCP says his constipation is related to his pain medication use. He has not been taking Protonix.    REVIEW OF SYSTEMS:   Constitutional: Denies fevers, chills or abnormal weight loss Eyes: Denies blurriness of vision Ears, nose, mouth, throat, and face: Denies mucositis or sore throat Respiratory: Denies cough, dyspnea or wheezes Cardiovascular: Denies palpitation, chest discomfort or lower extremity  swelling Gastrointestinal:  Denies nausea, heartburn (+) Constipation (+) GI bleeding  Skin: Denies  abnormal skin rashes Lymphatics: Denies new lymphadenopathy or easy bruising Neurological:Denies numbness, tingling or new weaknesses Behavioral/Psych: Mood is stable, no new changes  All other systems were reviewed with the patient and are negative.  MEDICAL HISTORY:  Past Medical History:  Diagnosis Date  . Anemia   . Asthma    AS A CHILD  . Blind left eye   . Cancer [facility])    PROSTATE CANCER  . Cellulitis    frequently, "all over"  . Chronic kidney disease   . Chronic pain syndrome   . Chronic ulcer of toe, right, with unspecified severity ([place])    2nd toe  . Constipation   . DDD (degenerative disc disease), lumbar   . Gastrointestinal stromal tumor (GIST) of stomach ([place])   . GI bleeding   . History of blood transfusion    01/2018  . Insomnia   . Kyphoscoliosis   . Mass in the abdomen 01/2018  . Restless leg   . Sleep apnea    does not wear CPAP  . Wears glasses     SURGICAL HISTORY: Past Surgical History:  Procedure Laterality Date  . AMPUTATION TOE Right 11/05/2018   Procedure: Right 2nd toe ampuation;  Surgeon: Wylene Simmer, MD;  Location: [place];  Service: Orthopedics;  Laterality: Right;  62mn  . CHOLECYSTECTOMY    . COLONOSCOPY    . ESOPHAGOGASTRODUODENOSCOPY (EGD) WITH PROPOFOL Left 02/16/2018   Procedure: ESOPHAGOGASTRODUODENOSCOPY (EGD) WITH PROPOFOL;  Surgeon: KRonnette Juniper MD;  [place] [employer]  Service: Gastroenterology;  Laterality: Left;  . LAPAROSCOPIC GASTRECTOMY N/A 03/12/2018   Procedure: LAPAROSCOPIC ASSISTED PARTIAL GASTRECTOMY ERAS PATHWAY;  Surgeon: BStark Klein MD;  [place]  Service: General;  Laterality: N/A;  . LAPAROSCOPIC PARTIAL GASTRECTOMY  03/12/2018   LAPAROSCOPIC ASSISTED PARTIAL GASTRECTOMY ERAS PATHWAY  . PROSTATECTOMY    . QUADRICEPS TENDON REPAIR Left 07/07/2017   Procedure: REPAIR QUADRICEP  TENDON;  Surgeon: SRod Can MD;  [place]  Service: Orthopedics;  Laterality: Left;  . RIGHT CATARACT EXTRACTION  2013  . RIGTH NEPHRECTOMY  2006  . TONSILLECTOMY    . TRANSURETHRAL RESECTION OF PROSTATE  1997    I have reviewed the social history and family history with the patient and they are unchanged from previous note.  ALLERGIES:  is allergic to coconut flavor [flavoring agent]; coconut oil; and food.  MEDICATIONS:  Current Outpatient Medications  Medication Sig Dispense Refill  . ferrous sulfate 325 (65 FE) MG tablet Take 325 mg by mouth daily with breakfast.    . gabapentin (NEURONTIN) 300 MG capsule Take 1 capsule (300 mg total) by mouth 3 (three) times daily. (Patient taking differently: Take 300 mg by mouth See admin instructions. Take 300 mg by mouth two to three times a day)    . imatinib (GLEEVEC) 100 MG tablet Take 4 tablets (400 mg total) by mouth daily. Take with meals and large glass of water.Caution:Chemotherapy 120 tablet 2  . mirtazapine (REMERON) 45 MG tablet Take 45 mg by mouth at bedtime.    .Marland Kitchenoxymorphone (OPANA) 10 MG tablet Take 10 mg by mouth every 6 (six) hours as needed for pain.      No current facility-administered medications for this visit.     PHYSICAL EXAMINATION: ECOG PERFORMANCE STATUS: 3 - Symptomatic, >50% confined to bed  Vitals:   08/25/19 1143  BP: (!) 130/59  Pulse: 66  Resp: 16  Temp: 98.9 F (37.2 C)  SpO2: 99%   Filed  Weights   08/25/19 1143  Weight: 142 lb 8 oz (64.6 kg)    GENERAL:alert, no distress and comfortable SKIN: skin color, texture, turgor are normal, no rashes or significant lesions EYES: normal, Conjunctiva are pink and non-injected, sclera clear  NECK: supple, thyroid normal size, non-tender, without nodularity LYMPH:  no palpable lymphadenopathy in the cervical, axillary  LUNGS: clear to auscultation and percussion with normal breathing effort HEART: regular rate & rhythm and no murmurs and no  lower extremity edema ABDOMEN:abdomen soft, non-tender and normal bowel sounds Musculoskeletal:no cyanosis of digits and no clubbing  NEURO: alert & oriented x 3 with fluent speech, no focal motor/sensory deficits  LABORATORY DATA:  I have reviewed the data as listed CBC Latest Ref Rng & Units 06/29/2019 04/07/2019 11/18/2018  WBC 4.0 - 10.5 K/uL 7.4 4.4 4.6  Hemoglobin 13.0 - 17.0 g/dL 11.2(L) 11.4(L) 12.3(L)  Hematocrit 39.0 - 52.0 % 34.2(L) 35.9(L) 38.1(L)  Platelets 150 - 400 K/uL 112(L) 108(L) 194     CMP Latest Ref Rng & Units 06/29/2019 04/07/2019 11/18/2018  Glucose 70 - 99 mg/dL 97 97 81  BUN 8 - 23 mg/dL 23 24(H) 15  Creatinine 0.61 - 1.24 mg/dL 0.94 0.86 0.97  Sodium 135 - 145 mmol/L 138 139 135  Potassium 3.5 - 5.1 mmol/L 4.5 4.2 4.7  Chloride 98 - 111 mmol/L 104 106 102  CO2 22 - 32 mmol/L '29 28 27  '$ Calcium 8.9 - 10.3 mg/dL 8.1(L) 8.0(L) 9.0  Total Protein 6.5 - 8.1 g/dL 5.5(L) 5.5(L) 6.6  Total Bilirubin 0.3 - 1.2 mg/dL 0.5 0.4 0.5  Alkaline Phos 38 - 126 U/L 54 59 76  AST 15 - 41 U/L 45(H) 40 36  ALT 0 - 44 U/L '19 25 25      '$ RADIOGRAPHIC STUDIES: I have personally reviewed the radiological images as listed and agreed with the findings in the report. No results found.   ASSESSMENT & PLAN:  Casey Gates is a 85 y.o. male with   1. Gastrointestinal Stromal Tumor (GIST) of the lesser curvature of the stomach, 13.8 cm, pT4pNX, G2, mitotic rate >20/50 high power field, stage IIIB -He was diagnosed in 01/2018. He is s/p tumor resection.Pathology revealed a large 13.8 cm GIST in the stomach. IHC was performed and the tumor is positive with CD117 (C-Kit). Genomic testing showed Kit Exon 11 mutation/insertion, which predicts benefit of Gleevec  -He has been on Gleevec since 05/26/18, currently at '400mg'$  daily. Plan for 3 years.He is tolerating well -recent CT scan in 04/2019 showed NED  -Due to his advanced age, comorbidities, and limited performance status, if he does have  significant side effects from [name], I have low threshold to stop it. -Outside labs reviewed from yesterday show plt 31K (repeated).  He has no petechia or ecchymosis on exam today.  We discussed his thrombocytopenia is probably related to [place].  Will hold [place] for now. Will monitor his platelet count. Will repeat today.   -Lab in 2 weeks and f/u in 4 weeks.    2.  Hematochezia -He notes mild GI bleeding occasionally recently.  -He also had stomach pain. Mild and intermittent  -He has no other bleeding or bruising.  -Could be related to Thrombocytopenia. I encouraged him to watch for skin bruising and signs of bleeding.   3. Anemia, iron deficient -Hehas been treated with blood transfusion -Currently onoral iron therapy 1 tab daily beginning in 04/2018, continue daily.   4. Chronic constipation, intermittent diarrhea -He is  on chronic opioid use with oxymorphone for chronic back pain, this is contributing to constipation. -He was giving himself enemas weekly because Miralax and senna is too strong and colase does not work for him.  -His PCP previously prescribed him Lynzest. He is currently on Movantik daily which works for him. He does note upset stomach.  -I encouraged him to continue Movantik maybe in addition to Miralax as constipation can make his GI bleeding worse.   5.Low appetite, Depression -He remains to have low appetite but overall stable weight.  -I encouraged him to increase his nutritional supplement to BID -He stopped taking mirtazapine given he did not feel it was working for him.  -He feels stressed about social isolation from COVID-19 has him depressed and effecting his eating.  -He is also very concerned about contracting COVID-19. His living facility is taking precautions to limit exposure.   6. S/p right 2nd toe amputation, right lower leg infection -He completed antibiotics this week -right leg erythema still present -Will monitor.F/u with  PCP  7. Chronic Back pain, Decubitus pressure ulcer  -He has been on long term oxymorphone as needed for hischronicback pain, which is the main factor limiting his activities. He uses it TID now, no less than 5 hours a part.  -He had previous sore of sacrumrecently. He was previously seen by wound nurse and wound clinic.    PLAN: -Continue to hold Gleevec due to thrombocytopenia  -repeat lab today  -Lab in 2 weeks  -Lab and f/u in 4 weeks    No problem-specific Assessment & Plan notes found for this encounter.   No orders of the defined types were placed in this encounter.  All questions were answered. The patient knows to call the clinic with any problems, questions or concerns. No barriers to learning was detected. I spent 15 minutes counseling the patient face to face. The total time spent in the appointment was 20 minutes and more than 50% was on counseling and review of test results     Truitt Merle, MD 08/25/2019   I, Joslyn Devon, am acting as scribe for Truitt Merle, MD.   I have reviewed the above documentation for accuracy and completeness, and I agree with the above.    Addendum  His CBC today showed plt 112K, I informed his son. Will hold [place] for a month to see if his mild thrombocytopenia resolves.  Truitt Merle  08/25/2019

## 2019-08-26 ENCOUNTER — Telehealth: Payer: Self-pay | Admitting: Hematology

## 2019-08-26 NOTE — Telephone Encounter (Signed)
Scheduled appt per 9/23 los.  SPoke with patient and he is aware of his appt date and time.

## 2019-08-27 ENCOUNTER — Telehealth: Payer: Self-pay

## 2019-08-27 NOTE — Telephone Encounter (Signed)
TC to pt per Cira Rue NP to let him know that his iron in normal range. Pt verbalized understanding. No further problems or concerns at this time.

## 2019-09-08 ENCOUNTER — Other Ambulatory Visit: Payer: Self-pay

## 2019-09-08 ENCOUNTER — Inpatient Hospital Stay: Payer: Medicare Other | Attending: Hematology

## 2019-09-08 DIAGNOSIS — G4730 Sleep apnea, unspecified: Secondary | ICD-10-CM | POA: Diagnosis not present

## 2019-09-08 DIAGNOSIS — D509 Iron deficiency anemia, unspecified: Secondary | ICD-10-CM | POA: Diagnosis not present

## 2019-09-08 DIAGNOSIS — K5909 Other constipation: Secondary | ICD-10-CM | POA: Insufficient documentation

## 2019-09-08 DIAGNOSIS — Z905 Acquired absence of kidney: Secondary | ICD-10-CM | POA: Diagnosis not present

## 2019-09-08 DIAGNOSIS — Z8546 Personal history of malignant neoplasm of prostate: Secondary | ICD-10-CM | POA: Diagnosis not present

## 2019-09-08 DIAGNOSIS — N189 Chronic kidney disease, unspecified: Secondary | ICD-10-CM | POA: Diagnosis not present

## 2019-09-08 DIAGNOSIS — J45909 Unspecified asthma, uncomplicated: Secondary | ICD-10-CM | POA: Diagnosis not present

## 2019-09-08 DIAGNOSIS — D696 Thrombocytopenia, unspecified: Secondary | ICD-10-CM | POA: Insufficient documentation

## 2019-09-08 DIAGNOSIS — Z9049 Acquired absence of other specified parts of digestive tract: Secondary | ICD-10-CM | POA: Insufficient documentation

## 2019-09-08 DIAGNOSIS — Z9079 Acquired absence of other genital organ(s): Secondary | ICD-10-CM | POA: Diagnosis not present

## 2019-09-08 DIAGNOSIS — Z79899 Other long term (current) drug therapy: Secondary | ICD-10-CM | POA: Diagnosis not present

## 2019-09-08 DIAGNOSIS — C49A2 Gastrointestinal stromal tumor of stomach: Secondary | ICD-10-CM

## 2019-09-08 DIAGNOSIS — G2581 Restless legs syndrome: Secondary | ICD-10-CM | POA: Diagnosis not present

## 2019-09-08 LAB — CMP (CANCER CENTER ONLY)
ALT: 16 U/L (ref 0–44)
AST: 41 U/L (ref 15–41)
Albumin: 3.8 g/dL (ref 3.5–5.0)
Alkaline Phosphatase: 56 U/L (ref 38–126)
Anion gap: 6 (ref 5–15)
BUN: 19 mg/dL (ref 8–23)
CO2: 31 mmol/L (ref 22–32)
Calcium: 8.8 mg/dL — ABNORMAL LOW (ref 8.9–10.3)
Chloride: 104 mmol/L (ref 98–111)
Creatinine: 0.89 mg/dL (ref 0.61–1.24)
GFR, Est AFR Am: >60 mL/min (ref >60)
GFR, Estimated: >60 mL/min (ref >60)
Glucose, Bld: 103 mg/dL — ABNORMAL HIGH (ref 70–99)
Potassium: 4.1 mmol/L (ref 3.5–5.1)
Sodium: 141 mmol/L (ref 135–145)
Total Bilirubin: 0.4 mg/dL (ref 0.3–1.2)
Total Protein: 5.9 g/dL — ABNORMAL LOW (ref 6.5–8.1)

## 2019-09-08 LAB — CBC WITH DIFFERENTIAL (CANCER CENTER ONLY)
Abs Immature Granulocytes: 0.02 10*3/uL (ref 0.00–0.07)
Basophils Absolute: 0 10*3/uL (ref 0.0–0.1)
Basophils Relative: 0 %
Eosinophils Absolute: 0.3 10*3/uL (ref 0.0–0.5)
Eosinophils Relative: 7 %
HCT: 34.8 % — ABNORMAL LOW (ref 39.0–52.0)
Hemoglobin: 11.2 g/dL — ABNORMAL LOW (ref 13.0–17.0)
Immature Granulocytes: 0 %
Lymphocytes Relative: 25 %
Lymphs Abs: 1.2 10*3/uL (ref 0.7–4.0)
MCH: 32.5 pg (ref 26.0–34.0)
MCHC: 32.2 g/dL (ref 30.0–36.0)
MCV: 100.9 fL — ABNORMAL HIGH (ref 80.0–100.0)
Monocytes Absolute: 0.7 10*3/uL (ref 0.1–1.0)
Monocytes Relative: 15 %
Neutro Abs: 2.6 10*3/uL (ref 1.7–7.7)
Neutrophils Relative %: 53 %
Platelet Count: 127 10*3/uL — ABNORMAL LOW (ref 150–400)
RBC: 3.45 MIL/uL — ABNORMAL LOW (ref 4.22–5.81)
RDW: 13 % (ref 11.5–15.5)
WBC Count: 4.9 10*3/uL (ref 4.0–10.5)
nRBC: 0 % (ref 0.0–0.2)

## 2019-09-08 LAB — FERRITIN: Ferritin: 43 ng/mL (ref 24–336)

## 2019-09-14 ENCOUNTER — Telehealth: Payer: Self-pay

## 2019-09-14 NOTE — Telephone Encounter (Signed)
-----   Message from Zola Button, RN sent at 09/14/2019 10:55 AM EDT ----- Regarding: Missed Calling This Patient Hello--I some how missed this message and did not call the patient last week. Do either of you have time to call the patient with the below information please?  Thanks,  Katelin ----- Message ----- From: Alla Feeling, NP Sent: 09/10/2019   9:53 AM EDT To: Truitt Merle, MD, Zola Button, RN  Katelin, I have reviewed CBC, his plt count is up to 127K. If he is not having any bleeding or other issues and agrees, he can restart gleevec 200 mg daily (2 tabs). He will return for lab and f/u in 2 weeks. If Dr. Burr Medico agrees with my plan please call the patient and let him know.  Thanks, Regan Rakers

## 2019-09-14 NOTE — Telephone Encounter (Signed)
Left voice message for patient to restart Gleevec 200 mg daily if he is currently not having any bleeding issues. Lab and f/u already scheduled.  Encouraged him to call back if he has any questions.

## 2019-09-15 ENCOUNTER — Telehealth: Payer: Self-pay | Admitting: Hematology

## 2019-09-15 NOTE — Telephone Encounter (Signed)
Per YF changed time of 10/21 lab/LB. Confirmed with patient.

## 2019-09-22 ENCOUNTER — Inpatient Hospital Stay (HOSPITAL_BASED_OUTPATIENT_CLINIC_OR_DEPARTMENT_OTHER): Payer: Medicare Other | Admitting: Nurse Practitioner

## 2019-09-22 ENCOUNTER — Inpatient Hospital Stay: Payer: Medicare Other

## 2019-09-22 ENCOUNTER — Other Ambulatory Visit: Payer: Self-pay

## 2019-09-22 ENCOUNTER — Encounter: Payer: Self-pay | Admitting: Nurse Practitioner

## 2019-09-22 VITALS — BP 160/74 | HR 78 | Temp 98.3°F | Resp 17 | Ht 71.0 in | Wt 132.7 lb

## 2019-09-22 DIAGNOSIS — C49A2 Gastrointestinal stromal tumor of stomach: Secondary | ICD-10-CM

## 2019-09-22 LAB — CMP (CANCER CENTER ONLY)
ALT: 28 U/L (ref 0–44)
AST: 50 U/L — ABNORMAL HIGH (ref 15–41)
Albumin: 4.1 g/dL (ref 3.5–5.0)
Alkaline Phosphatase: 68 U/L (ref 38–126)
Anion gap: 9 (ref 5–15)
BUN: 26 mg/dL — ABNORMAL HIGH (ref 8–23)
CO2: 26 mmol/L (ref 22–32)
Calcium: 9.2 mg/dL (ref 8.9–10.3)
Chloride: 105 mmol/L (ref 98–111)
Creatinine: 0.88 mg/dL (ref 0.61–1.24)
GFR, Est AFR Am: >60 mL/min (ref >60)
GFR, Estimated: >60 mL/min (ref >60)
Glucose, Bld: 97 mg/dL (ref 70–99)
Potassium: 4.2 mmol/L (ref 3.5–5.1)
Sodium: 140 mmol/L (ref 135–145)
Total Bilirubin: 0.7 mg/dL (ref 0.3–1.2)
Total Protein: 6.5 g/dL (ref 6.5–8.1)

## 2019-09-22 LAB — CBC WITH DIFFERENTIAL (CANCER CENTER ONLY)
Abs Immature Granulocytes: 0.04 10*3/uL (ref 0.00–0.07)
Basophils Absolute: 0 10*3/uL (ref 0.0–0.1)
Basophils Relative: 0 %
Eosinophils Absolute: 0.2 10*3/uL (ref 0.0–0.5)
Eosinophils Relative: 2 %
HCT: 35.8 % — ABNORMAL LOW (ref 39.0–52.0)
Hemoglobin: 11.9 g/dL — ABNORMAL LOW (ref 13.0–17.0)
Immature Granulocytes: 1 %
Lymphocytes Relative: 17 %
Lymphs Abs: 1.4 10*3/uL (ref 0.7–4.0)
MCH: 32.5 pg (ref 26.0–34.0)
MCHC: 33.2 g/dL (ref 30.0–36.0)
MCV: 97.8 fL (ref 80.0–100.0)
Monocytes Absolute: 0.9 10*3/uL (ref 0.1–1.0)
Monocytes Relative: 11 %
Neutro Abs: 5.8 10*3/uL (ref 1.7–7.7)
Neutrophils Relative %: 69 %
Platelet Count: 144 10*3/uL — ABNORMAL LOW (ref 150–400)
RBC: 3.66 MIL/uL — ABNORMAL LOW (ref 4.22–5.81)
RDW: 12.1 % (ref 11.5–15.5)
WBC Count: 8.3 10*3/uL (ref 4.0–10.5)
nRBC: 0 % (ref 0.0–0.2)

## 2019-09-22 NOTE — Progress Notes (Signed)
Casey Gates   [phone] [phone]   Clinic Follow up Note   Patient Care Team: Velna Hatchet, MD as PCP - General (Internal Medicine) [name], MD as PCP - Cardiology (Cardiology) 09/22/2019  CHIEF COMPLAINT: F/u GIST  SUMMARY OF ONCOLOGIC HISTORY: Oncology History Overview Note  Cancer Staging Malignant gastrointestinal stromal tumor (GIST) of stomach ([place]) Staging form: Gastrointestinal Stromal Tumor - Gastric and Omental GIST, AJCC 8th Edition - Pathologic stage from 04/22/2018: Stage IIIB (pT4, pN0, cM0, Mitotic Rate: High) - Signed by Alla Feeling, NP on 04/22/2018     Malignant gastrointestinal stromal tumor (GIST) of stomach ([place])  02/16/2018 Procedure   Upper EGD per Dr. [name] Impression:  - Normal upper third of esophagus and middle third of esophagus. - 5 cm hiatal hernia. - Widely patent Schatzki ring. - Clotted blood in Casey gastric body. - Rule out malignancy, gastric tumor on Casey lesser curvature of Casey stomach. Injected. Clips were placed. - Normal examined duodenum. - No specimens collected.   02/17/2018 Imaging   CT AP IMPRESSION: 1. 9.7 x 8.8 x 10.9 cm aggressive appearing heterogeneously enhancing mass along Casey lesser curvature of Casey stomach, highly concerning for primary gastric neoplasm. This is intimately associated with Casey undersurface of Casey left lobe of Casey liver without definitive evidence of direct invasion (although superficial involvement of Casey hepatic capsule is suspected given Casey loss of Casey intervening fat plane). There is also an indeterminate left adrenal nodule measuring 2.0 x 1.4 cm, which could be metastatic. No other definite signs of metastatic disease elsewhere in Casey abdomen or pelvis. 2. Areas of cylindrical bronchiectasis and chronic scarring in Casey lower lobes of Casey lungs bilaterally. 3. Aortic atherosclerosis, in addition to left main and 3 vessel coronary artery disease. 4.  Right kidney is either surgically or congenitally absent, or severely atrophic. 5. Additional incidental findings, as above.   02/19/2018 Initial Biopsy   VIA CT BIOPSY: Diagnosis -  Soft Tissue Needle Core Biopsy, gastric - FINDINGS CONSISTENT WITH GASTROINTESTINAL STROMAL TUMOR (GIST). - SEE COMMENT.   02/19/2018 Pathology Results   Diagnosis 02/19/2018 Soft Tissue Needle Core Biopsy, gastric - FINDINGS CONSISTENT WITH GASTROINTESTINAL STROMAL TUMOR (GIST).   03/12/2018 Initial Diagnosis   Malignant gastrointestinal stromal tumor (GIST) of stomach ([place])   03/12/2018 Pathology Results   Surgical path: Diagnosis Stomach, resection for tumor, Lesser curvature - GASTROINTESTINAL STROMAL TUMOR, 13.8 CM.   03/18/2018 Imaging   CT CAP IMPRESSION: -obtained d/t post op fever, leukocytosis, and respiratory change   Moderate right pleural effusion and small left pleural effusion. Compressive atelectasis versus pneumonia in Casey lower lobes. Nodular airspace disease throughout much of Casey right upper lobe and right middle lobe concerning for pneumonia.  Cardiomegaly. Severe/diffuse coronary artery disease. Aortic atherosclerosis. No evidence of aortic aneurysm.  Postoperative changes from recent partial gastrectomy. Small amount of free air in Casey abdomen, likely related to post op state. No evidence of bowel obstruction.  Prior remote right nephrectomy and prostatectomy.   03/22/2018 Procedure   Thoracentesis  Diagnosis PLEURAL FLUID, RIGHT (SPECIMEN 1 OF 1, COLLECTED ON 03/22/2018): NO MALIGNANT CELLS IDENTIFIED.     04/22/2018 Cancer Staging   Staging form: Gastrointestinal Stromal Tumor - Gastric and Omental GIST, AJCC 8th Edition - Pathologic stage from 04/22/2018: Stage IIIB (pT4, pN0, cM0, Mitotic Rate: High) - Signed by Alla Feeling, NP on 04/22/2018   05/03/2018 Imaging   05/03/2018 CT Abdomen and Pelvis W/ Contrast IMPRESSION: CTA  1. No pulmonary embolus. °2.  Debris within Casey right lower lobe bronchus with associated right °lower lobe opacity, this can be seen with aspiration. °3. Improved right upper lobe aeration from prior consistent with °resolving pneumonia. Near completely resolved right pleural °effusion. °4. Chronic bilateral lower lobe bronchiectasis. Bilateral lower lobe °airspace disease with some improvement from most recent prior. °5. Coronary artery calcifications. Aortic Atherosclerosis °(ICD10-I70.0). °  °CT abdomen/pelvis: °  °1. Large colonic stool burden, suggesting constipation. No bowel °obstruction. °2. Post recent partial gastrectomy without evident complication. No °intra-abdominal abscess. °3. Additional stable chronic findings as described. Aortic °Atherosclerosis (ICD10-I70.0). °  °05/03/2018 Imaging  ° 05/03/2018 °CT Angio Chest PE W or WO Contrast °IMPRESSION: °CTA chest: °  °1. No pulmonary embolus. °2. Debris within Casey right lower lobe bronchus with associated right °lower lobe opacity, this can be seen with aspiration. °3. Improved right upper lobe aeration from prior consistent with °resolving pneumonia. Near completely resolved right pleural °effusion. °4. Chronic bilateral lower lobe bronchiectasis. Bilateral lower lobe °airspace disease with some improvement from most recent prior. °5. Coronary artery calcifications. Aortic Atherosclerosis °(ICD10-I70.0). °  °05/03/2018 Imaging  ° 05/03/2018 CT CAP °IMPRESSION: °CTA chest: ° 1. No pulmonary embolus. °2. Debris within Casey right lower lobe bronchus with associated right °lower lobe opacity, this can be seen with aspiration. °3. Improved right upper lobe aeration from prior consistent with °resolving pneumonia. Near completely resolved right pleural °effusion. °4. Chronic bilateral lower lobe bronchiectasis. Bilateral lower lobe °airspace disease with some improvement from most recent prior. °5. Coronary artery calcifications. Aortic Atherosclerosis °(ICD10-I70.0). °  °CT abdomen/pelvis: ° 1.  Large colonic stool burden, suggesting constipation. No bowel °obstruction. °2. Post recent partial gastrectomy without evident complication. No °intra-abdominal abscess. °3. Additional stable chronic findings as described. Aortic °Atherosclerosis (ICD10-I70.0). ° °  °05/26/2018 -  Chemotherapy  ° Adjuvant Gleevec, starting on 05/26/18 at 200mg daily and gradually increased to 400mg daily since 06/23/2018 °  °11/19/2018 Imaging  ° CT CAP  °IMPRESSION: °1. Stable exam. No new or progressive interval findings. No features °to suggest metastatic disease in Casey chest, abdomen, or pelvis. °2. Bronchiectasis/scarring in Casey lung bases, similar to prior. °3. Stable small left adrenal nodule. °4.  Aortic Atherosclerois (ICD10-170.0) °  °04/14/2019 Imaging  ° IMPRESSION: °Status post partial gastrectomy. No evidence of recurrent or metastatic disease. °  °Status post cholecystectomy. Status post right nephrectomy. Status post prostatectomy with pelvic lymph node dissection. °  °Additional stable ancillary findings as above. °  ° ° °CURRENT THERAPY: Adjuvant Gleevec, starting on 05/26/18 at 200mg daily and gradually increased to 400mg daily since 06/23/2018. Held in 08/2019 given thrombocytopenia and GI bleeding.  ° °INTERVAL HISTORY: Casey Gates returns for f/u as scheduled. He was last seen by Dr. Feng on 08/25/19. Gleevec was held for thrombocytopenia. Labs repeated on 10/7 showed improvement and he was called/informed to restart at 200 mg daily but did not get Casey message, he remains off Gleevec. He can't tell a difference in Casey way he feels if he takes it or not. Has lost some weight since last visit, not really interested in eating. He attributes this to fear of having frequent BM or constipation. Drinks ensure once daily. Has a hard BM every other day, no recent GI bleeding. Takes Movantik once per day. No n/v. Denies new or worsening abdominal pain. His activity level is low but stable. He is limited in what he can do at his  facility due to COVID19. He is now able to eat in [facility] cafeteria but 6   ft away from others. He fell recently when he got tangled up in portable commode, he "bumped" right leg but did not sustain any other major bleeding or injury.  ° ° °MEDICAL HISTORY:  °Past Medical History:  °Diagnosis Date  °• Anemia   °• Asthma   ° AS A CHILD  °• Blind left eye   °• Cancer (HCC)   ° PROSTATE CANCER  °• Cellulitis   ° frequently, "all over"  °• Chronic kidney disease   °• Chronic pain syndrome   °• Chronic ulcer of toe, right, with unspecified severity (HCC)   ° 2nd toe  °• Constipation   °• DDD (degenerative disc disease), lumbar   °• Gastrointestinal stromal tumor (GIST) of stomach (HCC)   °• GI bleeding   °• History of blood transfusion   ° 01/2018  °• Insomnia   °• Kyphoscoliosis   °• Mass in Casey abdomen 01/2018  °• Restless leg   °• Sleep apnea   ° does not wear CPAP  °• Wears glasses   ° ° °SURGICAL HISTORY: °Past Surgical History:  °Procedure Laterality Date  °• AMPUTATION TOE Right 11/05/2018  ° Procedure: Right 2nd toe ampuation;  Surgeon: Hewitt, John, MD;  Location: [facility];  Service: Orthopedics;  Laterality: Right;  60min  °• CHOLECYSTECTOMY    °• COLONOSCOPY    °• ESOPHAGOGASTRODUODENOSCOPY (EGD) WITH PROPOFOL Left 02/16/2018  ° Procedure: ESOPHAGOGASTRODUODENOSCOPY (EGD) WITH PROPOFOL;  Surgeon: Karki, Arya, MD;  Location: [facility];  Service: Gastroenterology;  Laterality: Left;  °• LAPAROSCOPIC GASTRECTOMY N/A 03/12/2018  ° Procedure: LAPAROSCOPIC ASSISTED PARTIAL GASTRECTOMY ERAS PATHWAY;  Surgeon: Byerly, Faera, MD;  Location: [facility];  Service: General;  Laterality: N/A;  °• LAPAROSCOPIC PARTIAL GASTRECTOMY  03/12/2018  ° LAPAROSCOPIC ASSISTED PARTIAL GASTRECTOMY ERAS PATHWAY  °• PROSTATECTOMY    °• QUADRICEPS TENDON REPAIR Left 07/07/2017  ° Procedure: REPAIR QUADRICEP TENDON;  Surgeon: Swinteck, Brian, MD;  Location: [facility];  Service: Orthopedics;  Laterality: Left;  °• RIGHT CATARACT EXTRACTION   2013  °• RIGTH NEPHRECTOMY  2006  °• TONSILLECTOMY    °• TRANSURETHRAL RESECTION OF PROSTATE  1997  ° ° °I have reviewed Casey social history and family history with Casey patient and they are unchanged from previous note. ° °ALLERGIES:  is allergic to coconut flavor [flavoring agent]; coconut oil; and food. ° °MEDICATIONS:  °Current Outpatient Medications  °Medication Sig Dispense Refill  °• Calcium Carbonate-Vitamin D (CALCIUM 500/D PO) Take 1 tablet by mouth daily. Unsure of dosage    °• gabapentin (NEURONTIN) 300 MG capsule Take 1 capsule (300 mg total) by mouth 3 (three) times daily. (Patient taking differently: Take 300 mg by mouth See admin instructions. Take 300 mg by mouth two to three times a day)    °• oxymorphone (OPANA) 10 MG tablet Take 10 mg by mouth every 6 (six) hours as needed for pain.     °• ferrous sulfate 325 (65 FE) MG tablet Take 325 mg by mouth daily with breakfast.    °• mirtazapine (REMERON) 45 MG tablet Take 45 mg by mouth at bedtime.    °• MOVANTIK 25 MG TABS tablet Take 1 tablet by mouth daily.    ° °No current facility-administered medications for this visit.   ° ° °PHYSICAL EXAMINATION: °ECOG PERFORMANCE STATUS: 2-3 ° °Vitals:  ° 09/22/19 1405  °BP: (!) 160/74  °Pulse: 78  °Resp: 17  °Temp: 98.3 °F (36.8 °C)  °SpO2: 95%  ° °Filed Weights  °   09/22/19 1405  Weight: 132 lb 11.2 oz (60.2 kg)    GENERAL:alert, no distress and comfortable SKIN: no rash  EYES: sclera clear LUNGS: clear with normal breathing effort HEART: regular rate & rhythm, no lower extremity edema ABDOMEN:abdomen soft, non-tender and normal bowel sounds. No hepatomegaly or mass  NEURO: alert & oriented x 3 with fluent speech, presents in wheelchair   LABORATORY DATA:  I have reviewed Casey data as listed CBC Latest Ref Rng & Units 09/22/2019 09/08/2019 08/25/2019  WBC 4.0 - 10.5 K/uL 8.3 4.9 4.7  Hemoglobin 13.0 - 17.0 g/dL 11.9(L) 11.2(L) 11.3(L)  Hematocrit 39.0 - 52.0 % 35.8(L) 34.8(L) 35.1(L)  Platelets 150  - 400 K/uL 144(L) 127(L) 117(L)     CMP Latest Ref Rng & Units 09/22/2019 09/08/2019 08/25/2019  Glucose 70 - 99 mg/dL 97 103(H) 93  BUN 8 - 23 mg/dL 26(H) 19 17  Creatinine 0.61 - 1.24 mg/dL 0.88 0.89 0.97  Sodium 135 - 145 mmol/L 140 141 139  Potassium 3.5 - 5.1 mmol/L 4.2 4.1 4.2  Chloride 98 - 111 mmol/L 105 104 106  CO2 22 - 32 mmol/L _0 Calcium 8.9 - 10.3 mg/dL 9.2 8.8(L) 8.4(L)  Total Protein 6.5 - 8.1 g/dL 6.5 5.9(L) 5.5(L)  Total Bilirubin 0.3 - 1.2 mg/dL 0.7 0.4 0.4  Alkaline Phos 38 - 126 U/L 68 56 51  AST 15 - 41 U/L 50(H) 41 43(H)  ALT 0 - 44 U/L _1 RADIOGRAPHIC STUDIES: I have personally reviewed Casey radiological images as listed and agreed with Casey findings in Casey report. No results found.   ASSESSMENT & PLAN: 85 yo male   1. Gastrointestinal Stromal Tumor (GIST) of Casey lesser curvature of Casey stomach, 13.8 cm, pT4pNX, G2, mitotic rate >20/50 high power field, stage IIIB -diagnosed in 01/2018 s/p resection. 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  2. Hypotension, dizziness  -resolved  3. Chronic constipation  -for approximately 1 year -likely related to chronic opioid use  -he does not tolerate senokot or miralax well -has hard BM every other day with Movantik, no recent GI bleeding    4. Anemia, iron deficiency  -on oral iron intermittently -ferritin 10/7 was 43 -I recommend to take oral iron every other day as it can contribute to constipation  -Hgb 11.9 today, stable  5. Decreased appetite  -10 lbs weight loss in Casey last month -he was previously prescribed mirtazapine but did not notice benefit, he did no refill his prescription -drinks 1 boost per day, I encouraged him to increase to BID  6. Chronic back pain, skin wound -followed by pain team and wound  care  -on oxymorphone   Disposition:  Casey Gates appears stable. His PS is stable but has lost 10 lbs since last visit which is somewhat concerning. I think some of his anorexia is related to [place] isolation at his facility. He previously did not benefit from mirtazapine. I encouraged him to increase activity as tolerated and increase Boost/ensure to 2 per day. He has no other clinical signs of recurrence. Last scan in 04/2019 showed NED.  Labs reviewed, CMP is stable. CBC shows improved thrombocytopenia, PLT 144K. He denies bleeding. Ferritin on 10/7 is in normal range. He can continue oral iron once every other day,  as this can contribute to constipation.   We discussed goals of care, Casey Gates seems somewhat indifferent but his son is interested to continue [place] if he can tolerate it. Casey patient is agreeable. I recommend to restart at 200 mg daily. He will return for scheduled lab and f/u on 11/27. If he is not tolerating well or he develops recurrent cytopenias, I would likely recommend to stop it and proceed with observation only.   We reviewed signs of cancer recurrence such as new abdominal pain or worsening fatigue and weight loss. His son is very involved in his care and he was present for today's visit. He will call if there are concerns. I reviewed Casey plan with Dr. Burr Medico.   No problem-specific Assessment & Plan notes found for this encounter.   No orders of Casey defined types were placed in this encounter.  All questions were answered. Casey patient knows to call Casey clinic with any problems, questions or concerns. No barriers to learning was detected. I spent 20 minutes counseling Casey patient face to face. Casey total time spent in Casey appointment was 25 minutes and more than 50% was on counseling and review of test results     Alla Feeling, NP 09/22/19

## 2019-09-23 ENCOUNTER — Telehealth: Payer: Self-pay | Admitting: Nurse Practitioner

## 2019-09-23 NOTE — Telephone Encounter (Signed)
No los per 10/21. °

## 2019-09-27 ENCOUNTER — Telehealth: Payer: Self-pay | Admitting: Hematology

## 2019-09-27 NOTE — Telephone Encounter (Signed)
YF PAL 11/27. Appointments moved from 11/27 to 11/24. Confirmed with patient and son [name].

## 2019-10-20 ENCOUNTER — Other Ambulatory Visit: Payer: Self-pay | Admitting: Hematology

## 2019-10-20 DIAGNOSIS — C49A2 Gastrointestinal stromal tumor of stomach: Secondary | ICD-10-CM

## 2019-10-20 MED FILL — IMATINIB MESYLATE 100 MG TA: 100 | 30 days supply | Qty: 120 | Fill #0

## 2019-10-25 ENCOUNTER — Telehealth: Payer: Self-pay | Admitting: Hematology

## 2019-10-25 NOTE — Telephone Encounter (Signed)
Confirmed telephone visit for 11/24 and verified pt's information

## 2019-10-25 NOTE — Progress Notes (Signed)
[facility]   [phone] [phone]   Clinic Follow up Note   Patient Care Team: Velna Hatchet, MD as PCP - General (Internal Medicine) [name], MD as PCP - Cardiology (Cardiology)   I connected with Casey Gates on 10/26/2019 at 10:20 AM EST by telephone visit and verified that I am speaking with the correct person using two identifiers.  I discussed the limitations, risks, security and privacy concerns of performing an evaluation and management service by telephone and the availability of in person appointments. I also discussed with the patient that there may be a patient responsible charge related to this service. The patient expressed understanding and agreed to proceed.   Other persons participating in the visit and their role in the encounter:  His son  Patient's location:  His home  Provider's location:  My office  CHIEF COMPLAINT: F/u of GIST  SUMMARY OF ONCOLOGIC HISTORY: Oncology History Overview Note  Cancer Staging Malignant gastrointestinal stromal tumor (GIST) of stomach ([place]) Staging form: Gastrointestinal Stromal Tumor - Gastric and Omental GIST, AJCC 8th Edition - Pathologic stage from 04/22/2018: Stage IIIB (pT4, pN0, cM0, Mitotic Rate: High) - Signed by Alla Feeling, NP on 04/22/2018     Malignant gastrointestinal stromal tumor (GIST) of stomach ([place])  02/16/2018 Procedure   Upper EGD per Dr. [name] Impression:  - Normal upper third of esophagus and middle third of esophagus. - 5 cm hiatal hernia. - Widely patent Schatzki ring. - Clotted blood in the gastric body. - Rule out malignancy, gastric tumor on the lesser curvature of the stomach. Injected. Clips were placed. - Normal examined duodenum. - No specimens collected.   02/17/2018 Imaging   CT AP IMPRESSION: 1. 9.7 x 8.8 x 10.9 cm aggressive appearing heterogeneously enhancing mass along the lesser curvature of the stomach, highly concerning for  primary gastric neoplasm. This is intimately associated with the undersurface of the left lobe of the liver without definitive evidence of direct invasion (although superficial involvement of the hepatic capsule is suspected given the loss of the intervening fat plane). There is also an indeterminate left adrenal nodule measuring 2.0 x 1.4 cm, which could be metastatic. No other definite signs of metastatic disease elsewhere in the abdomen or pelvis. 2. Areas of cylindrical bronchiectasis and chronic scarring in the lower lobes of the lungs bilaterally. 3. Aortic atherosclerosis, in addition to left main and 3 vessel coronary artery disease. 4. Right kidney is either surgically or congenitally absent, or severely atrophic. 5. Additional incidental findings, as above.   02/19/2018 Initial Biopsy   VIA CT BIOPSY: Diagnosis -  Soft Tissue Needle Core Biopsy, gastric - FINDINGS CONSISTENT WITH GASTROINTESTINAL STROMAL TUMOR (GIST). - SEE COMMENT.   02/19/2018 Pathology Results   Diagnosis 02/19/2018 Soft Tissue Needle Core Biopsy, gastric - FINDINGS CONSISTENT WITH GASTROINTESTINAL STROMAL TUMOR (GIST).   03/12/2018 Initial Diagnosis   Malignant gastrointestinal stromal tumor (GIST) of stomach ([place])   03/12/2018 Pathology Results   Surgical path: Diagnosis Stomach, resection for tumor, Lesser curvature - GASTROINTESTINAL STROMAL TUMOR, 13.8 CM.   03/18/2018 Imaging   CT CAP IMPRESSION: -obtained d/t post op fever, leukocytosis, and respiratory change   Moderate right pleural effusion and small left pleural effusion. Compressive atelectasis versus pneumonia in the lower lobes. Nodular airspace disease throughout much of the right upper lobe and right middle lobe concerning for pneumonia.  Cardiomegaly. Severe/diffuse coronary artery disease. Aortic atherosclerosis. No evidence of aortic aneurysm.  Postoperative changes from recent partial  gastrectomy. Small amount of free air in the abdomen,  likely related to post op state. No evidence of bowel obstruction.  Prior remote right nephrectomy and prostatectomy.   03/22/2018 Procedure   Thoracentesis  Diagnosis PLEURAL FLUID, RIGHT (SPECIMEN 1 OF 1, COLLECTED ON 03/22/2018): NO MALIGNANT CELLS IDENTIFIED.     04/22/2018 Cancer Staging   Staging form: Gastrointestinal Stromal Tumor - Gastric and Omental GIST, AJCC 8th Edition - Pathologic stage from 04/22/2018: Stage IIIB (pT4, pN0, cM0, Mitotic Rate: High) - Signed by Alla Feeling, NP on 04/22/2018   05/03/2018 Imaging   05/03/2018 CT Abdomen and Pelvis W/ Contrast IMPRESSION: CTA chest:  1. No pulmonary embolus. 2. Debris within the right lower lobe bronchus with associated right lower lobe opacity, this can be seen with aspiration. 3. Improved right upper lobe aeration from prior consistent with resolving pneumonia. Near completely resolved right pleural effusion. 4. Chronic bilateral lower lobe bronchiectasis. Bilateral lower lobe airspace disease with some improvement from most recent prior. 5. Coronary artery calcifications. Aortic Atherosclerosis (ICD10-I70.0).  CT abdomen/pelvis:  1. Large colonic stool burden, suggesting constipation. No bowel obstruction. 2. Post recent partial gastrectomy without evident complication. No intra-abdominal abscess. 3. Additional stable chronic findings as described. Aortic Atherosclerosis (ICD10-I70.0).   05/03/2018 Imaging   05/03/2018 CT Angio Chest PE W or WO Contrast IMPRESSION: CTA chest:  1. No pulmonary embolus. 2. Debris within the right lower lobe bronchus with associated right lower lobe opacity, this can be seen with aspiration. 3. Improved right upper lobe aeration from prior consistent with resolving pneumonia. Near completely resolved right pleural effusion. 4. Chronic bilateral lower lobe bronchiectasis. Bilateral lower lobe airspace disease with some improvement from most recent prior. 5. Coronary  artery calcifications. Aortic Atherosclerosis (ICD10-I70.0).   05/03/2018 Imaging   05/03/2018 CT CAP IMPRESSION: CTA chest: 1. No pulmonary embolus. 2. Debris within the right lower lobe bronchus with associated right lower lobe opacity, this can be seen with aspiration. 3. Improved right upper lobe aeration from prior consistent with resolving pneumonia. Near completely resolved right pleural effusion. 4. Chronic bilateral lower lobe bronchiectasis. Bilateral lower lobe airspace disease with some improvement from most recent prior. 5. Coronary artery calcifications. Aortic Atherosclerosis (ICD10-I70.0).  CT abdomen/pelvis: 1. Large colonic stool burden, suggesting constipation. No bowel obstruction. 2. Post recent partial gastrectomy without evident complication. No intra-abdominal abscess. 3. Additional stable chronic findings as described. Aortic Atherosclerosis (ICD10-I70.0).    05/26/2018 -  Chemotherapy   Adjuvant Gleevec, starting on 05/26/18 at 230m daily and gradually increased to 4095mdaily since 06/23/2018   11/19/2018 Imaging   CT CAP  IMPRESSION: 1. Stable exam. No new or progressive interval findings. No features to suggest metastatic disease in the chest, abdomen, or pelvis. 2. Bronchiectasis/scarring in the lung bases, similar to prior. 3. Stable small left adrenal nodule. 4.  Aortic Atherosclerois (ICD10-170.0)   04/14/2019 Imaging   IMPRESSION: Status post partial gastrectomy. No evidence of recurrent or metastatic disease.   Status post cholecystectomy. Status post right nephrectomy. Status post prostatectomy with pelvic lymph node dissection.   Additional stable ancillary findings as above.      CURRENT THERAPY:  Adjuvant Gleevec, startingon 6/25/19at 2004maily and gradually increased to 400m71mily since7/23/2019. Held in 08/2019 given thrombocytopenia and GI bleeding. Restarted 09/22/19 at 200mg42mly. Increase 300mg 50m5/20 for 2 weeks then  increase to 400mg. 74mNTERVAL HISTORY:  MillardTraylon Schimminge for a follow up of GIST. He notes he restarted Gleevec 200mg41m  daily. He denies any issues from for Casey Gates. He denies having diarrhea. He is still at living facility and stable. He denies any recent concerns. He denies any recent blood in stool, nausea or significant pain.     REVIEW OF SYSTEMS:   Constitutional: Denies fevers, chills or abnormal weight loss Eyes: Denies blurriness of vision Ears, nose, mouth, throat, and face: Denies mucositis or sore throat Respiratory: Denies cough, dyspnea or wheezes Cardiovascular: Denies palpitation, chest discomfort or lower extremity swelling Gastrointestinal:  Denies nausea, heartburn or change in bowel habits Skin: Denies abnormal skin rashes Lymphatics: Denies new lymphadenopathy or easy bruising Neurological:Denies numbness, tingling or new weaknesses Behavioral/Psych: Mood is stable, no new changes  All other systems were reviewed with the patient and are negative.  MEDICAL HISTORY:  Past Medical History:  Diagnosis Date  . Anemia   . Asthma    AS A CHILD  . Blind left eye   . Cancer [facility])    PROSTATE CANCER  . Cellulitis    frequently, "all over"  . Chronic kidney disease   . Chronic pain syndrome   . Chronic ulcer of toe, right, with unspecified severity ([place])    2nd toe  . Constipation   . DDD (degenerative disc disease), lumbar   . Gastrointestinal stromal tumor (GIST) of stomach ([place])   . GI bleeding   . History of blood transfusion    01/2018  . Insomnia   . Kyphoscoliosis   . Mass in the abdomen 01/2018  . Restless leg   . Sleep apnea    does not wear CPAP  . Wears glasses     SURGICAL HISTORY: Past Surgical History:  Procedure Laterality Date  . AMPUTATION TOE Right 11/05/2018   Procedure: Right 2nd toe ampuation;  Surgeon: Wylene Simmer, MD;  Location: [facility];  Service: Orthopedics;  Laterality: Right;  74mn  . CHOLECYSTECTOMY     . COLONOSCOPY    . ESOPHAGOGASTRODUODENOSCOPY (EGD) WITH PROPOFOL Left 02/16/2018   Procedure: ESOPHAGOGASTRODUODENOSCOPY (EGD) WITH PROPOFOL;  Surgeon: KRonnette Juniper MD;  [facility]  Service: Gastroenterology;  Laterality: Left;  . LAPAROSCOPIC GASTRECTOMY N/A 03/12/2018   Procedure: LAPAROSCOPIC ASSISTED PARTIAL GASTRECTOMY ERAS PATHWAY;  Surgeon: BStark Klein MD;  [facility] [facility]  Service: General;  Laterality: N/A;  . LAPAROSCOPIC PARTIAL GASTRECTOMY  03/12/2018   LAPAROSCOPIC ASSISTED PARTIAL GASTRECTOMY ERAS PATHWAY  . PROSTATECTOMY    . QUADRICEPS TENDON REPAIR Left 07/07/2017   Procedure: REPAIR QUADRICEP TENDON;  Surgeon: SRod Can MD;  [place] [facility]  Service: Orthopedics;  Laterality: Left;  . RIGHT CATARACT EXTRACTION  2013  . RIGTH NEPHRECTOMY  2006  . TONSILLECTOMY    . TRANSURETHRAL RESECTION OF PROSTATE  1997    I have reviewed the social history and family history with the patient and they are unchanged from previous note.  ALLERGIES:  is allergic to coconut flavor [flavoring agent]; coconut oil; and food.  MEDICATIONS:  Current Outpatient Medications  Medication Sig Dispense Refill  . Calcium Carbonate-Vitamin D (CALCIUM 500/D PO) Take 1 tablet by mouth daily. Unsure of dosage    . ferrous sulfate 325 (65 FE) MG tablet Take 325 mg by mouth daily with breakfast.    . gabapentin (NEURONTIN) 300 MG capsule Take 1 capsule (300 mg total) by mouth 3 (three) times daily. (Patient taking differently: Take 300 mg by mouth See admin instructions. Take 300 mg by mouth two to three times a day)    . imatinib (GLEEVEC)  100 MG tablet TAKE 4 TABLETS (400MG) BY MOUTH ONCE DAILY ON A FULL STOMACH AND A GLASS OF WATER. 120 tablet 0  . mirtazapine (REMERON) 45 MG tablet Take 45 mg by mouth at bedtime.    Marland Kitchen MOVANTIK 25 MG TABS tablet Take 1 tablet by mouth daily.    Marland Kitchen oxymorphone (OPANA) 10 MG tablet Take 10 mg by mouth every 6 (six) hours as needed for pain.       No current facility-administered medications for this visit.     PHYSICAL EXAMINATION: ECOG PERFORMANCE STATUS: 2 - Symptomatic, <50% confined to bed  No vitals taken today, Exam not performed today   LABORATORY DATA:  I have reviewed the data as listed CBC Latest Ref Rng & Units 09/22/2019 09/08/2019 08/25/2019  WBC 4.0 - 10.5 K/uL 8.3 4.9 4.7  Hemoglobin 13.0 - 17.0 g/dL 11.9(L) 11.2(L) 11.3(L)  Hematocrit 39.0 - 52.0 % 35.8(L) 34.8(L) 35.1(L)  Platelets 150 - 400 K/uL 144(L) 127(L) 117(L)     CMP Latest Ref Rng & Units 09/22/2019 09/08/2019 08/25/2019  Glucose 70 - 99 mg/dL 97 103(H) 93  BUN 8 - 23 mg/dL 26(H) 19 17  Creatinine 0.61 - 1.24 mg/dL 0.88 0.89 0.97  Sodium 135 - 145 mmol/L 140 141 139  Potassium 3.5 - 5.1 mmol/L 4.2 4.1 4.2  Chloride 98 - 111 mmol/L 105 104 106  CO2 22 - 32 mmol/L _0 Calcium 8.9 - 10.3 mg/dL 9.2 8.8(L) 8.4(L)  Total Protein 6.5 - 8.1 g/dL 6.5 5.9(L) 5.5(L)  Total Bilirubin 0.3 - 1.2 mg/dL 0.7 0.4 0.4  Alkaline Phos 38 - 126 U/L 68 56 51  AST 15 - 41 U/L 50(H) 41 43(H)  ALT 0 - 44 U/L _1 RADIOGRAPHIC STUDIES: I have personally reviewed the radiological images as listed and agreed with the findings in the report. No results found.   ASSESSMENT & PLAN:  Eudell Mcphee is a 85 y.o. male with   1. Gastrointestinal Stromal Tumor (GIST) of the lesser curvature of the stomach, 13.8 cm, pT4pNX, G2, mitotic rate >20/50 high power field, stage IIIB -He was diagnosed in 01/2018. He is s/p tumor resection.Pathology revealed a large 13.8 cm GIST in the stomach. IHC was performed and the tumor is positive with CD117 (C-Kit). Genomic testing showed Kit Exon 11 mutation/insertion, which predicts benefit from [place].  -He has been on [place] since 05/26/18. Plan for 2 years (instead of standard 3 years due to his advanced age).He has tolerating well. It was held in Sep 2020 due to thrombocytopenia. He has restarted one month ago with 251m  daily, tolerating wel, no signs of bleeding.  -He notes to being clinically stable with no concerns. Given he is doing well on Gleevec, will increase to 3094mtomorrow for 2 weeks then increase to 40073mfter two weeks.   -Plan to repeat CT scan in 04/2020.  -F/u in 4 weeks   2. Hematochezia -resolved.   3. Anemia, iron deficient -Hehas been treated with blood transfusionin the past -Currently onoral iron therapy 1 tab daily beginning in 04/2018, continue daily.  4. Chronic constipation, intermittent diarrhea -Related to chronic opioid use with oxymorphone -He does not tolerate Miralax and senna, Lynzest and colase does not work for him. -He uses enemas and Oral Movantik.   5.Low appetite, Depression -He previously stopped Mirtazapine due to lack of benefit. -I encouraged him to increase his nutritional supplement to BID -He remains to have  low appetite but overall stable weight.  -He has stress and concerns about contracting COVID.   6. S/p right 2nd toe amputation  7. Chronic Back pain, Decubitus pressure ulcer  -He has been on long term oxymorphone as needed for hischronicback pain, which is the main factor limiting his activities. He uses it TID now, no less than 5 hours a part.  -He has healed and pain is manageable.    PLAN: -Continue Gleevec, increase from 253m to 3081mdaily tomorrow for 2 weeks then increase to 40045m  -Lab and f/u in 4 weeks, pt is not able to get lab done at his facility    No problem-specific Assessment & Plan notes found for this encounter.   No orders of the defined types were placed in this encounter.  I discussed the assessment and treatment plan with the patient. The patient was provided an opportunity to ask questions and all were answered. The patient agreed with the plan and demonstrated an understanding of the instructions.  The patient was advised to call back or seek an in-person evaluation if the symptoms worsen or  if the condition fails to improve as anticipated.  I provided 15 minutes of non face-to-face telephone visit time during this encounter, and > 50% was spent counseling as documented under my assessment & plan.    YanTruitt MerleD 10/26/2019   I, AmoJoslyn Devonm acting as scribe for YanTruitt MerleD.   I have reviewed the above documentation for accuracy and completeness, and I agree with the above.

## 2019-10-25 NOTE — Telephone Encounter (Signed)
Changed appt on 11/24 to phone call - per 11/22 sch message - pt aware of appt change .

## 2019-10-26 ENCOUNTER — Inpatient Hospital Stay: Payer: Medicare Other | Attending: Hematology | Admitting: Hematology

## 2019-10-26 ENCOUNTER — Telehealth: Payer: Self-pay

## 2019-10-26 ENCOUNTER — Inpatient Hospital Stay: Payer: Medicare Other

## 2019-10-26 DIAGNOSIS — C49A2 Gastrointestinal stromal tumor of stomach: Secondary | ICD-10-CM | POA: Diagnosis not present

## 2019-10-26 NOTE — Telephone Encounter (Signed)
Left message for patient after speaking to [place], according to [place] was delivered on 11/19 at 10:26 in the morning.  I asked him to check with his office at the facility and she if they have the package.  Instructed him to call me back and let me know.

## 2019-10-27 ENCOUNTER — Encounter: Payer: Self-pay | Admitting: Hematology

## 2019-10-27 ENCOUNTER — Telehealth: Payer: Self-pay | Admitting: Hematology

## 2019-10-27 NOTE — Telephone Encounter (Signed)
Scheduled appt per 11/24 los.  Spoke with pt and they are aware of their appt date and time,

## 2019-10-29 ENCOUNTER — Other Ambulatory Visit: Payer: Medicare Other

## 2019-10-29 ENCOUNTER — Ambulatory Visit: Payer: Medicare Other | Admitting: Hematology

## 2019-11-05 ENCOUNTER — Telehealth: Payer: Self-pay

## 2019-11-05 NOTE — Telephone Encounter (Signed)
Patient's son Ched Nedrow calls stating that he had increased the [place] to 3 a day, for past 2 days has had a small amount of blood in his underwear, no bleeding with bowel movements or pain.   Consulted with Dr. Burr Medico, called him back to let him know unsure if related to [facility] however he can cut it back to two a day or observe.  He opts to cut it back to 2 a day, instructed him to call back if bleeding increasing or does not resolve.  He verbalized an understanding.

## 2019-11-10 ENCOUNTER — Ambulatory Visit: Payer: Medicare Other | Admitting: Podiatry

## 2019-11-12 ENCOUNTER — Ambulatory Visit: Payer: Medicare Other | Admitting: Podiatry

## 2019-11-22 ENCOUNTER — Other Ambulatory Visit: Payer: Self-pay | Admitting: Hematology

## 2019-11-22 DIAGNOSIS — C49A2 Gastrointestinal stromal tumor of stomach: Secondary | ICD-10-CM

## 2019-11-22 MED FILL — IMATINIB MESYLATE 100 MG TA: 100 | 30 days supply | Qty: 120 | Fill #0

## 2019-11-29 NOTE — Progress Notes (Addendum)
[facility]   Telephone:(336) [phone] [phone]) [phone]   Clinic Follow up Note   Patient Care Team: Velna Hatchet, MD as PCP - General (Internal Medicine) [name], MD as PCP - Cardiology (Cardiology)  Date of Service:  12/01/2019   I connected with Casey Gates on 12/01/2019 by video enabled telemedicine visit and verified that I am speaking with the correct person using two identifiers.   I discussed the limitations, risks, security and privacy concerns of performing an evaluation and management service by telephone and the availability of in person appointments. I also discussed with the patient that there may be a patient responsible charge related to this service. The patient expressed understanding and agreed to proceed.   Other persons participating in the visit and their role in the encounter: Pt's son [name] and my scribe Fredrik Rigger    Patient's location: office  Provider's location:  Home  CHIEF COMPLAINT: F/u of GIST  SUMMARY OF ONCOLOGIC HISTORY: Oncology History Overview Note  Cancer Staging Malignant gastrointestinal stromal tumor (GIST) of stomach ([place]) Staging form: Gastrointestinal Stromal Tumor - Gastric and Omental GIST, AJCC 8th Edition - Pathologic stage from 04/22/2018: Stage IIIB (pT4, pN0, cM0, Mitotic Rate: High) - Signed by Alla Feeling, NP on 04/22/2018     Malignant gastrointestinal stromal tumor (GIST) of stomach ([place])  02/16/2018 Procedure   Upper EGD per Dr. [name] Impression:  - Normal upper third of esophagus and middle third of esophagus. - 5 cm hiatal hernia. - Widely patent Schatzki ring. - Clotted blood in the gastric body. - Rule out malignancy, gastric tumor on the lesser curvature of the stomach. Injected. Clips were placed. - Normal examined duodenum. - No specimens collected.   02/17/2018 Imaging   CT AP IMPRESSION: 1. 9.7 x 8.8 x 10.9 cm aggressive appearing heterogeneously enhancing mass  along the lesser curvature of the stomach, highly concerning for primary gastric neoplasm. This is intimately associated with the undersurface of the left lobe of the liver without definitive evidence of direct invasion (although superficial involvement of the hepatic capsule is suspected given the loss of the intervening fat plane). There is also an indeterminate left adrenal nodule measuring 2.0 x 1.4 cm, which could be metastatic. No other definite signs of metastatic disease elsewhere in the abdomen or pelvis. 2. Areas of cylindrical bronchiectasis and chronic scarring in the lower lobes of the lungs bilaterally. 3. Aortic atherosclerosis, in addition to left main and 3 vessel coronary artery disease. 4. Right kidney is either surgically or congenitally absent, or severely atrophic. 5. Additional incidental findings, as above.   02/19/2018 Initial Biopsy   VIA CT BIOPSY: Diagnosis -  Soft Tissue Needle Core Biopsy, gastric - FINDINGS CONSISTENT WITH GASTROINTESTINAL STROMAL TUMOR (GIST). - SEE COMMENT.   02/19/2018 Pathology Results   Diagnosis 02/19/2018 Soft Tissue Needle Core Biopsy, gastric - FINDINGS CONSISTENT WITH GASTROINTESTINAL STROMAL TUMOR (GIST).   03/12/2018 Initial Diagnosis   Malignant gastrointestinal stromal tumor (GIST) of stomach ([place])   03/12/2018 Pathology Results   Surgical path: Diagnosis Stomach, resection for tumor, Lesser curvature - GASTROINTESTINAL STROMAL TUMOR, 13.8 CM.   03/18/2018 Imaging   CT CAP IMPRESSION: -obtained d/t post op fever, leukocytosis, and respiratory change   Moderate right pleural effusion and small left pleural effusion. Compressive atelectasis versus pneumonia in the lower lobes. Nodular airspace disease throughout much of the right upper lobe and right middle lobe concerning for pneumonia.  Cardiomegaly. Severe/diffuse coronary artery disease. Aortic atherosclerosis. No evidence  of aortic aneurysm.  Postoperative changes from  recent partial gastrectomy. Small amount of free air in the abdomen, likely related to post op state. No evidence of bowel obstruction.  Prior remote right nephrectomy and prostatectomy.   03/22/2018 Procedure   Thoracentesis  Diagnosis PLEURAL FLUID, RIGHT (SPECIMEN 1 OF 1, COLLECTED ON 03/22/2018): NO MALIGNANT CELLS IDENTIFIED.     04/22/2018 Cancer Staging   Staging form: Gastrointestinal Stromal Tumor - Gastric and Omental GIST, AJCC 8th Edition - Pathologic stage from 04/22/2018: Stage IIIB (pT4, pN0, cM0, Mitotic Rate: High) - Signed by Alla Feeling, NP on 04/22/2018   05/03/2018 Imaging   05/03/2018 CT Abdomen and Pelvis W/ Contrast IMPRESSION: CTA chest:  1. No pulmonary embolus. 2. Debris within the right lower lobe bronchus with associated right lower lobe opacity, this can be seen with aspiration. 3. Improved right upper lobe aeration from prior consistent with resolving pneumonia. Near completely resolved right pleural effusion. 4. Chronic bilateral lower lobe bronchiectasis. Bilateral lower lobe airspace disease with some improvement from most recent prior. 5. Coronary artery calcifications. Aortic Atherosclerosis (ICD10-I70.0).  CT abdomen/pelvis:  1. Large colonic stool burden, suggesting constipation. No bowel obstruction. 2. Post recent partial gastrectomy without evident complication. No intra-abdominal abscess. 3. Additional stable chronic findings as described. Aortic Atherosclerosis (ICD10-I70.0).   05/03/2018 Imaging   05/03/2018 CT Angio Chest PE W or WO Contrast IMPRESSION: CTA chest:  1. No pulmonary embolus. 2. Debris within the right lower lobe bronchus with associated right lower lobe opacity, this can be seen with aspiration. 3. Improved right upper lobe aeration from prior consistent with resolving pneumonia. Near completely resolved right pleural effusion. 4. Chronic bilateral lower lobe bronchiectasis. Bilateral lower lobe airspace  disease with some improvement from most recent prior. 5. Coronary artery calcifications. Aortic Atherosclerosis (ICD10-I70.0).   05/03/2018 Imaging   05/03/2018 CT CAP IMPRESSION: CTA chest: 1. No pulmonary embolus. 2. Debris within the right lower lobe bronchus with associated right lower lobe opacity, this can be seen with aspiration. 3. Improved right upper lobe aeration from prior consistent with resolving pneumonia. Near completely resolved right pleural effusion. 4. Chronic bilateral lower lobe bronchiectasis. Bilateral lower lobe airspace disease with some improvement from most recent prior. 5. Coronary artery calcifications. Aortic Atherosclerosis (ICD10-I70.0).  CT abdomen/pelvis: 1. Large colonic stool burden, suggesting constipation. No bowel obstruction. 2. Post recent partial gastrectomy without evident complication. No intra-abdominal abscess. 3. Additional stable chronic findings as described. Aortic Atherosclerosis (ICD10-I70.0).    05/26/2018 -  Chemotherapy   Adjuvant Gleevec, starting on 05/26/18 at '200mg'$  daily and gradually increased to '400mg'$  daily since 06/23/2018   11/19/2018 Imaging   CT CAP  IMPRESSION: 1. Stable exam. No new or progressive interval findings. No features to suggest metastatic disease in the chest, abdomen, or pelvis. 2. Bronchiectasis/scarring in the lung bases, similar to prior. 3. Stable small left adrenal nodule. 4.  Aortic Atherosclerois (ICD10-170.0)   04/14/2019 Imaging   IMPRESSION: Status post partial gastrectomy. No evidence of recurrent or metastatic disease.   Status post cholecystectomy. Status post right nephrectomy. Status post prostatectomy with pelvic lymph node dissection.   Additional stable ancillary findings as above.      CURRENT THERAPY:  Adjuvant Gleevec, startingon 6/25/19at '200mg'$  daily and gradually increased to '400mg'$  daily since7/23/2019. Held in 08/2019 given thrombocytopenia and GI bleeding.  Restarted 09/22/19 at '200mg'$  daily. Increase '300mg'$  10/27/19 for 2 weeks then increase to '400mg'$ .     INTERVAL HISTORY:  Casey Gates is here for a  follow up of GIST. He presents to the clinic with his son. He notes he is doing fairly well and stable. He is currently on '400mg'$  Gleevec for 1 week now since his last rectal bleeding. This has not recurred since.  He notes having left abdominal pain occasionally along with constipation. He notes his appetite is still low but able to maintain and gain some weight. He notes he has been eating better and taking boost supplemental nutrition. He is in living facility and plans to get COVID19 vaccine when available.     REVIEW OF SYSTEMS:   Constitutional: Denies fevers, chills (+) Low appetite, weight gain Eyes: Denies blurriness of vision Ears, nose, mouth, throat, and face: Denies mucositis or sore throat Respiratory: Denies cough, dyspnea or wheezes Cardiovascular: Denies palpitation, chest discomfort or lower extremity swelling Gastrointestinal:  Denies nausea, heartburn (+) Occasional left abdominal pain and constipation Skin: Denies abnormal skin rashes Lymphatics: Denies new lymphadenopathy or easy bruising Neurological:Denies numbness, tingling or new weaknesses Behavioral/Psych: Mood is stable, no new changes  All other systems were reviewed with the patient and are negative.  MEDICAL HISTORY:  Past Medical History:  Diagnosis Date  . Anemia   . Asthma    AS A CHILD  . Blind left eye   . Cancer [facility])    PROSTATE CANCER  . Cellulitis    frequently, "all over"  . Chronic kidney disease   . Chronic pain syndrome   . Chronic ulcer of toe, right, with unspecified severity ([place])    2nd toe  . Constipation   . DDD (degenerative disc disease), lumbar   . Gastrointestinal stromal tumor (GIST) of stomach ([place])   . GI bleeding   . History of blood transfusion    01/2018  . Insomnia   . Kyphoscoliosis   . Mass in the abdomen 01/2018  .  Restless leg   . Sleep apnea    does not wear CPAP  . Wears glasses     SURGICAL HISTORY: Past Surgical History:  Procedure Laterality Date  . AMPUTATION TOE Right 11/05/2018   Procedure: Right 2nd toe ampuation;  Surgeon: Wylene Simmer, MD;  Location: [facility];  Service: Orthopedics;  Laterality: Right;  29mn  . CHOLECYSTECTOMY    . COLONOSCOPY    . ESOPHAGOGASTRODUODENOSCOPY (EGD) WITH PROPOFOL Left 02/16/2018   Procedure: ESOPHAGOGASTRODUODENOSCOPY (EGD) WITH PROPOFOL;  Surgeon: KRonnette Juniper MD;  Location[facility]  Service: Gastroenterology;  Laterality: Left;  . LAPAROSCOPIC GASTRECTOMY N/A 03/12/2018   Procedure: LAPAROSCOPIC ASSISTED PARTIAL GASTRECTOMY ERAS PATHWAY;  Surgeon: BStark Klein MD;  [facility]  Service: General;  Laterality: N/A;  . LAPAROSCOPIC PARTIAL GASTRECTOMY  03/12/2018   LAPAROSCOPIC ASSISTED PARTIAL GASTRECTOMY ERAS PATHWAY  . PROSTATECTOMY    . QUADRICEPS TENDON REPAIR Left 07/07/2017   Procedure: REPAIR QUADRICEP TENDON;  Surgeon: SRod Can MD;  [place] [facility]  Service: Orthopedics;  Laterality: Left;  . RIGHT CATARACT EXTRACTION  2013  . RIGTH NEPHRECTOMY  2006  . TONSILLECTOMY    . TRANSURETHRAL RESECTION OF PROSTATE  1997    I have reviewed the social history and family history with the patient and they are unchanged from previous note.  ALLERGIES:  is allergic to coconut flavor [flavoring agent]; coconut oil; and food.  MEDICATIONS:  Current Outpatient Medications  Medication Sig Dispense Refill  . Calcium Carbonate-Vitamin D (CALCIUM 500/D PO) Take 1 tablet by mouth daily. Unsure of dosage    . ferrous sulfate 325 (65 FE)  MG tablet Take 325 mg by mouth daily with breakfast.    . gabapentin (NEURONTIN) 300 MG capsule Take 1 capsule (300 mg total) by mouth 3 (three) times daily. (Patient taking differently: Take 300 mg by mouth See admin instructions. Take 300 mg by mouth two to three times a day)    . imatinib  (GLEEVEC) 100 MG tablet TAKE 4 TABLETS ('400MG'$ ) BY MOUTH ONCE DAILY ON A FULL STOMACH AND A GLASS OF WATER. 120 tablet 0  . MOVANTIK 25 MG TABS tablet Take 1 tablet by mouth daily.    Marland Kitchen oxymorphone (OPANA) 10 MG tablet Take 10 mg by mouth every 6 (six) hours as needed for pain.      No current facility-administered medications for this visit.    PHYSICAL EXAMINATION: ECOG PERFORMANCE STATUS: 3 - Symptomatic, >50% confined to bed  Vitals:   12/01/19 0922  BP: (!) 156/67  Pulse: 72  Resp: 17  Temp: 98.5 F (36.9 C)  SpO2: 99%   Filed Weights   12/01/19 0922  Weight: 143 lb 11.2 oz (65.2 kg)    Exam not performed today per virtual visit  LABORATORY DATA:  I have reviewed the data as listed CBC Latest Ref Rng & Units 12/01/2019 09/22/2019 09/08/2019  WBC 4.0 - 10.5 K/uL 5.5 8.3 4.9  Hemoglobin 13.0 - 17.0 g/dL 12.0(L) 11.9(L) 11.2(L)  Hematocrit 39.0 - 52.0 % 37.1(L) 35.8(L) 34.8(L)  Platelets 150 - 400 K/uL 136(L) 144(L) 127(L)     CMP Latest Ref Rng & Units 09/22/2019 09/08/2019 08/25/2019  Glucose 70 - 99 mg/dL 97 103(H) 93  BUN 8 - 23 mg/dL 26(H) 19 17  Creatinine 0.61 - 1.24 mg/dL 0.88 0.89 0.97  Sodium 135 - 145 mmol/L 140 141 139  Potassium 3.5 - 5.1 mmol/L 4.2 4.1 4.2  Chloride 98 - 111 mmol/L 105 104 106  CO2 22 - 32 mmol/L '26 31 27  '$ Calcium 8.9 - 10.3 mg/dL 9.2 8.8(L) 8.4(L)  Total Protein 6.5 - 8.1 g/dL 6.5 5.9(L) 5.5(L)  Total Bilirubin 0.3 - 1.2 mg/dL 0.7 0.4 0.4  Alkaline Phos 38 - 126 U/L 68 56 51  AST 15 - 41 U/L 50(H) 41 43(H)  ALT 0 - 44 U/L '28 16 16      '$ RADIOGRAPHIC STUDIES: I have personally reviewed the radiological images as listed and agreed with the findings in the report. No results found.   ASSESSMENT & PLAN:  Casey Gates is a 85 y.o. male with   1. Gastrointestinal Stromal Tumor (GIST) of the lesser curvature of the stomach, 13.8 cm, pT4pNX, G2, mitotic rate >20/50 high power field, stage IIIB -He was diagnosed in 01/2018. He is s/p  tumor resection.Pathology revealed a large 13.8 cm GIST in the stomach. IHC was performed and the tumor is positive with CD117 (C-Kit). Genomic testing showed Kit Exon 11 mutation/insertion, which predicts benefit from [place].  -He has been on [place] since 05/26/18. Plan for 2 years (instead of standard 3 years due to his advanced age). It was held in Sep 2020 due to thrombocytopenia. He has restarted in 09/22/2019, and increased dose to full dose '400mg'$  dialy, tolerating well, no signs of recurrent bleeding.  -He is mostly clinically stable with low appetite but has been able to gain and maintain weight.  -He has been back to '400mg'$  Gleevec for 1 week. He has tolerated well with no GI bleeding recurrence. Labs reviewed, Hg 12 and plt 136K. Plan to continue at current dose, and complete 2  years treatment in 05/2020. Plan to repeat scan before completion.  -F/u in 3 months with lab   2.Hematochezia -resolved. No recurrence after returning to full dose Gleevec.   3. Anemia, iron deficient -Hehas been treated with blood transfusionin the past -Currently onoral iron therapy 1 tab daily beginning in 04/2018, continue daily.Stable.   4. Chronic constipation, intermittent diarrhea -Related to chronic opioid use with oxymorphone -He does not tolerate Miralaxand senna, Lynzest and colase does not work for him. -He uses enemas and Oral Movantik.   5.Low appetite, Depression -He previously stopped Mirtazapine due to lack of benefit. -He has stress and concerns about contracting COVID. He plans to get Vaccine when it becomes available in his facility. I encouraged him to get it.  -He remains to have low appetite but able to eat adequately use nutritional supplement to maintain and gain weight.    6. S/p right 2nd toe amputation  7. Chronic Back pain, Decubitus pressure ulcer  -He has been on long term oxymorphone as needed for hischronicback pain, which is the main factor limiting his  activities. He uses it TID now, no less than 5 hours a part.  -Not mentioned today, ulcer likely resolved.    PLAN: -Continue Gleevec '400mg'$  daily -Lab and F/u in 3 months    No problem-specific Assessment & Plan notes found for this encounter.   No orders of the defined types were placed in this encounter.  All questions were answered. The patient knows to call the clinic with any problems, questions or concerns. No barriers to learning was detected. I spent 20 minutes counseling the patient face to face. The total time spent in the appointment was 25 minutes and more than 50% was on counseling and review of test results     Truitt Merle, MD 12/01/2019   I, Joslyn Devon, am acting as scribe for Truitt Merle, MD.   I have reviewed the above documentation for accuracy and completeness, and I agree with the above.

## 2019-12-01 ENCOUNTER — Inpatient Hospital Stay: Payer: Medicare Other | Attending: Hematology

## 2019-12-01 ENCOUNTER — Telehealth: Payer: Self-pay | Admitting: Hematology

## 2019-12-01 ENCOUNTER — Encounter: Payer: Self-pay | Admitting: Hematology

## 2019-12-01 ENCOUNTER — Other Ambulatory Visit: Payer: Self-pay

## 2019-12-01 ENCOUNTER — Inpatient Hospital Stay: Payer: Medicare Other | Admitting: Hematology

## 2019-12-01 VITALS — BP 156/67 | HR 72 | Temp 98.5°F | Resp 17 | Ht 71.0 in | Wt 143.7 lb

## 2019-12-01 DIAGNOSIS — C49A2 Gastrointestinal stromal tumor of stomach: Secondary | ICD-10-CM | POA: Diagnosis not present

## 2019-12-01 LAB — CMP (CANCER CENTER ONLY)
ALT: 18 U/L (ref 0–44)
AST: 40 U/L (ref 15–41)
Albumin: 3.6 g/dL (ref 3.5–5.0)
Alkaline Phosphatase: 73 U/L (ref 38–126)
Anion gap: 7 (ref 5–15)
BUN: 22 mg/dL (ref 8–23)
CO2: 26 mmol/L (ref 22–32)
Calcium: 8.1 mg/dL — ABNORMAL LOW (ref 8.9–10.3)
Chloride: 106 mmol/L (ref 98–111)
Creatinine: 0.81 mg/dL (ref 0.61–1.24)
GFR, Est AFR Am: >60 mL/min (ref >60)
GFR, Estimated: >60 mL/min (ref >60)
Glucose, Bld: 96 mg/dL (ref 70–99)
Potassium: 4 mmol/L (ref 3.5–5.1)
Sodium: 139 mmol/L (ref 135–145)
Total Bilirubin: 0.4 mg/dL (ref 0.3–1.2)
Total Protein: 5.9 g/dL — ABNORMAL LOW (ref 6.5–8.1)

## 2019-12-01 LAB — CBC WITH DIFFERENTIAL (CANCER CENTER ONLY)
Abs Immature Granulocytes: 0.01 10*3/uL (ref 0.00–0.07)
Basophils Absolute: 0 10*3/uL (ref 0.0–0.1)
Basophils Relative: 1 %
Eosinophils Absolute: 0.4 10*3/uL (ref 0.0–0.5)
Eosinophils Relative: 8 %
HCT: 37.1 % — ABNORMAL LOW (ref 39.0–52.0)
Hemoglobin: 12 g/dL — ABNORMAL LOW (ref 13.0–17.0)
Immature Granulocytes: 0 %
Lymphocytes Relative: 21 %
Lymphs Abs: 1.2 10*3/uL (ref 0.7–4.0)
MCH: 31 pg (ref 26.0–34.0)
MCHC: 32.3 g/dL (ref 30.0–36.0)
MCV: 95.9 fL (ref 80.0–100.0)
Monocytes Absolute: 0.7 10*3/uL (ref 0.1–1.0)
Monocytes Relative: 12 %
Neutro Abs: 3.2 10*3/uL (ref 1.7–7.7)
Neutrophils Relative %: 58 %
Platelet Count: 136 10*3/uL — ABNORMAL LOW (ref 150–400)
RBC: 3.87 MIL/uL — ABNORMAL LOW (ref 4.22–5.81)
RDW: 13.3 % (ref 11.5–15.5)
WBC Count: 5.5 10*3/uL (ref 4.0–10.5)
nRBC: 0 % (ref 0.0–0.2)

## 2019-12-01 LAB — FERRITIN: Ferritin: 59 ng/mL (ref 24–336)

## 2019-12-01 NOTE — Telephone Encounter (Signed)
Scheduled appt per 12/30 los.  Sent a message to HIM pool to get a calendar mailed out. 

## 2019-12-17 ENCOUNTER — Other Ambulatory Visit: Payer: Self-pay | Admitting: Hematology

## 2019-12-17 DIAGNOSIS — C49A2 Gastrointestinal stromal tumor of stomach: Secondary | ICD-10-CM

## 2019-12-20 MED FILL — IMATINIB MESYLATE 100 MG TA: 100 | 30 days supply | Qty: 120 | Fill #0

## 2019-12-29 ENCOUNTER — Telehealth: Payer: Self-pay

## 2019-12-29 NOTE — Telephone Encounter (Signed)
Reference number for Imatinib authorization at [facility] is [id number]

## 2019-12-29 NOTE — Telephone Encounter (Signed)
Mr Graves's son dropped off letter from Mr [employer] that states they will only authorized 90 tablets per month.  I called [phone] and completed questionnaire.  I was told there was a 72 hour turn around time for approval.  I was told this will be faxed to this office.

## 2019-12-30 ENCOUNTER — Telehealth: Payer: Self-pay

## 2019-12-30 NOTE — Telephone Encounter (Signed)
Left voice message for patient's son Casey Gates regarding [place] to notify him the it had been approved by [facility].  Encouraged him to call back if he has any questions.

## 2020-01-17 ENCOUNTER — Other Ambulatory Visit: Payer: Self-pay | Admitting: Hematology

## 2020-01-17 DIAGNOSIS — C49A2 Gastrointestinal stromal tumor of stomach: Secondary | ICD-10-CM

## 2020-01-17 NOTE — Telephone Encounter (Signed)
Medication refill

## 2020-01-18 ENCOUNTER — Telehealth: Payer: Self-pay | Admitting: Hematology

## 2020-01-18 NOTE — Telephone Encounter (Signed)
Moved appts per MD.  Casey Gates with pt and he is aware of his appt date and time.  Per the patient request, sent HIM pool a message to get a calendar mailed out.

## 2020-01-31 ENCOUNTER — Other Ambulatory Visit: Payer: Self-pay | Admitting: Pharmacist

## 2020-01-31 DIAGNOSIS — C49A2 Gastrointestinal stromal tumor of stomach: Secondary | ICD-10-CM

## 2020-01-31 MED ORDER — IMATINIB MESYLATE 100 MG PO TABS: 400.0000 mg | ORAL_TABLET | Freq: Every day | ORAL | 0 refills | Status: DC

## 2020-02-01 ENCOUNTER — Telehealth: Payer: Self-pay

## 2020-02-01 MED FILL — IMATINIB MESYLATE 100 MG TA: 100 | 30 days supply | Qty: 120 | Fill #0

## 2020-02-01 NOTE — Telephone Encounter (Signed)
Oral Oncology Patient Advocate Encounter  Prior Authorization for [place] has been approved.    PA# [id number] Effective dates: 01/31/20 through 12/01/20  Patients co-pay is $100  Oral Oncology Clinic will continue to follow.   [place] Patient [place] Phone [phone] Fax [phone] 02/01/2020 8:09 AM

## 2020-02-01 NOTE — Telephone Encounter (Signed)
Oral Oncology Patient Advocate Encounter  Received notification from [facility] that prior authorization for [place] is required.  PA submitted on CoverMyMeds Key [id number] Status is pending  Oral Oncology Clinic will continue to follow.  [place] Patient [place] Phone [phone] Fax [phone] 02/01/2020 8:08 AM

## 2020-02-18 ENCOUNTER — Other Ambulatory Visit: Payer: Self-pay | Admitting: Hematology

## 2020-02-18 DIAGNOSIS — C49A2 Gastrointestinal stromal tumor of stomach: Secondary | ICD-10-CM

## 2020-02-21 NOTE — Telephone Encounter (Signed)
Refill request

## 2020-02-21 NOTE — Progress Notes (Signed)
[place]   Telephone:([phone]) [phone] Fax:(336) [phone]   Clinic Follow up Note   Patient Care Team: Velna Hatchet, MD as PCP - General (Internal Medicine) [name], MD as PCP - Cardiology (Cardiology)  Date of Service:  03/02/2020  CHIEF COMPLAINT: F/u of GIST  SUMMARY OF ONCOLOGIC HISTORY: Oncology History Overview Note  Cancer Staging Malignant gastrointestinal stromal tumor (GIST) of stomach ([place]) Staging form: Gastrointestinal Stromal Tumor - Gastric and Omental GIST, AJCC 8th Edition - Pathologic stage from 04/22/2018: Stage IIIB (pT4, pN0, cM0, Mitotic Rate: High) - Signed by Alla Feeling, NP on 04/22/2018     Malignant gastrointestinal stromal tumor (GIST) of stomach ([place])  02/16/2018 Procedure   Upper EGD per Dr. [name] Impression:  - Normal upper third of esophagus and middle third of esophagus. - 5 cm hiatal hernia. - Widely patent Schatzki ring. - Clotted blood in the gastric body. - Rule out malignancy, gastric tumor on the lesser curvature of the stomach. Injected. Clips were placed. - Normal examined duodenum. - No specimens collected.   02/17/2018 Imaging   CT AP IMPRESSION: 1. 9.7 x 8.8 x 10.9 cm aggressive appearing heterogeneously enhancing mass along the lesser curvature of the stomach, highly concerning for primary gastric neoplasm. This is intimately associated with the undersurface of the left lobe of the liver without definitive evidence of direct invasion (although superficial involvement of the hepatic capsule is suspected given the loss of the intervening fat plane). There is also an indeterminate left adrenal nodule measuring 2.0 x 1.4 cm, which could be metastatic. No other definite signs of metastatic disease elsewhere in the abdomen or pelvis. 2. Areas of cylindrical bronchiectasis and chronic scarring in the lower lobes of the lungs bilaterally. 3. Aortic atherosclerosis, in addition to left main and 3 vessel coronary  artery disease. 4. Right kidney is either surgically or congenitally absent, or severely atrophic. 5. Additional incidental findings, as above.   02/19/2018 Initial Biopsy   VIA CT BIOPSY: Diagnosis -  Soft Tissue Needle Core Biopsy, gastric - FINDINGS CONSISTENT WITH GASTROINTESTINAL STROMAL TUMOR (GIST). - SEE COMMENT.   02/19/2018 Pathology Results   Diagnosis 02/19/2018 Soft Tissue Needle Core Biopsy, gastric - FINDINGS CONSISTENT WITH GASTROINTESTINAL STROMAL TUMOR (GIST).   03/12/2018 Initial Diagnosis   Malignant gastrointestinal stromal tumor (GIST) of stomach ([place])   03/12/2018 Pathology Results   Surgical path: Diagnosis Stomach, resection for tumor, Lesser curvature - GASTROINTESTINAL STROMAL TUMOR, 13.8 CM.   03/18/2018 Imaging   CT CAP IMPRESSION: -obtained d/t post op fever, leukocytosis, and respiratory change   Moderate right pleural effusion and small left pleural effusion. Compressive atelectasis versus pneumonia in the lower lobes. Nodular airspace disease throughout much of the right upper lobe and right middle lobe concerning for pneumonia.  Cardiomegaly. Severe/diffuse coronary artery disease. Aortic atherosclerosis. No evidence of aortic aneurysm.  Postoperative changes from recent partial gastrectomy. Small amount of free air in the abdomen, likely related to post op state. No evidence of bowel obstruction.  Prior remote right nephrectomy and prostatectomy.   03/22/2018 Procedure   Thoracentesis  Diagnosis PLEURAL FLUID, RIGHT (SPECIMEN 1 OF 1, COLLECTED ON 03/22/2018): NO MALIGNANT CELLS IDENTIFIED.     04/22/2018 Cancer Staging   Staging form: Gastrointestinal Stromal Tumor - Gastric and Omental GIST, AJCC 8th Edition - Pathologic stage from 04/22/2018: Stage IIIB (pT4, pN0, cM0, Mitotic Rate: High) - Signed by Alla Feeling, NP on 04/22/2018   05/03/2018 Imaging   05/03/2018 CT Abdomen and  Pelvis W/ Contrast IMPRESSION: CTA chest:  1. No  pulmonary embolus. 2. Debris within the right lower lobe bronchus with associated right lower lobe opacity, this can be seen with aspiration. 3. Improved right upper lobe aeration from prior consistent with resolving pneumonia. Near completely resolved right pleural effusion. 4. Chronic bilateral lower lobe bronchiectasis. Bilateral lower lobe airspace disease with some improvement from most recent prior. 5. Coronary artery calcifications. Aortic Atherosclerosis (ICD10-I70.0).  CT abdomen/pelvis:  1. Large colonic stool burden, suggesting constipation. No bowel obstruction. 2. Post recent partial gastrectomy without evident complication. No intra-abdominal abscess. 3. Additional stable chronic findings as described. Aortic Atherosclerosis (ICD10-I70.0).   05/03/2018 Imaging   05/03/2018 CT Angio Chest PE W or WO Contrast IMPRESSION: CTA chest:  1. No pulmonary embolus. 2. Debris within the right lower lobe bronchus with associated right lower lobe opacity, this can be seen with aspiration. 3. Improved right upper lobe aeration from prior consistent with resolving pneumonia. Near completely resolved right pleural effusion. 4. Chronic bilateral lower lobe bronchiectasis. Bilateral lower lobe airspace disease with some improvement from most recent prior. 5. Coronary artery calcifications. Aortic Atherosclerosis (ICD10-I70.0).   05/03/2018 Imaging   05/03/2018 CT CAP IMPRESSION: CTA chest: 1. No pulmonary embolus. 2. Debris within the right lower lobe bronchus with associated right lower lobe opacity, this can be seen with aspiration. 3. Improved right upper lobe aeration from prior consistent with resolving pneumonia. Near completely resolved right pleural effusion. 4. Chronic bilateral lower lobe bronchiectasis. Bilateral lower lobe airspace disease with some improvement from most recent prior. 5. Coronary artery calcifications. Aortic Atherosclerosis (ICD10-I70.0).  CT  abdomen/pelvis: 1. Large colonic stool burden, suggesting constipation. No bowel obstruction. 2. Post recent partial gastrectomy without evident complication. No intra-abdominal abscess. 3. Additional stable chronic findings as described. Aortic Atherosclerosis (ICD10-I70.0).    05/26/2018 -  Chemotherapy   Adjuvant Gleevec, starting on 05/26/18 at '200mg'$  daily and gradually increased to '400mg'$  daily since 06/23/2018   11/19/2018 Imaging   CT CAP  IMPRESSION: 1. Stable exam. No new or progressive interval findings. No features to suggest metastatic disease in the chest, abdomen, or pelvis. 2. Bronchiectasis/scarring in the lung bases, similar to prior. 3. Stable small left adrenal nodule. 4.  Aortic Atherosclerois (ICD10-170.0)   04/14/2019 Imaging   IMPRESSION: Status post partial gastrectomy. No evidence of recurrent or metastatic disease.   Status post cholecystectomy. Status post right nephrectomy. Status post prostatectomy with pelvic lymph node dissection.   Additional stable ancillary findings as above.      CURRENT THERAPY:  Adjuvant Gleevec, startingon 6/25/19at '200mg'$  daily and gradually increased to '400mg'$  daily since7/23/2019. Held in 08/2019 given thrombocytopenia and GI bleeding.Restarted 09/22/19 at '200mg'$  daily.Increase '300mg'$  10/27/19 for 2 weeks then increase to '400mg'$ .   INTERVAL HISTORY:  Casey Gates is here for a follow up of GIST. He presents to the clinic with his son. He is stable with constipation. He is worried about it so he does not eat. He has lost 6 pounds. His son notes 2.5 weeks ago a cat punctured his right thigh and was infected. He is on antibiotic for 1 week to treat. He is keeping it clean but he notes it is causes him pain. He is on Movantik and with mild relief. He can still not have BM for up to 3 days. He notes he is on Ompana (oxymorphome) managed by Dr. Nelva Bush. He still lives in independent living center and get around with walker. His  constipation does not come with  pain but with mental concern. He does not want to liver in assisted living if he can manage.  He is on Gleevec 4 pills daily.     REVIEW OF SYSTEMS:   Constitutional: Denies fevers, chills or abnormal weight loss Eyes: Denies blurriness of vision Ears, nose, mouth, throat, and face: Denies mucositis or sore throat Respiratory: Denies cough, dyspnea or wheezes Cardiovascular: Denies palpitation, chest discomfort or lower extremity swelling Gastrointestinal:  Denies nausea, heartburn (+) Constipation Skin: Denies abnormal skin rashes (+) Puncture of right anterior thigh, healing  Lymphatics: Denies new lymphadenopathy or easy bruising Neurological:Denies numbness, tingling or new weaknesses Behavioral/Psych: Mood is stable, no new changes  All other systems were reviewed with the patient and are negative.  MEDICAL HISTORY:  Past Medical History:  Diagnosis Date  . Anemia   . Asthma    AS A CHILD  . Blind left eye   . Cancer [facility])    PROSTATE CANCER  . Cellulitis    frequently, "all over"  . Chronic kidney disease   . Chronic pain syndrome   . Chronic ulcer of toe, right, with unspecified severity ([place])    2nd toe  . Constipation   . DDD (degenerative disc disease), lumbar   . Gastrointestinal stromal tumor (GIST) of stomach ([place])   . GI bleeding   . History of blood transfusion    01/2018  . Insomnia   . Kyphoscoliosis   . Mass in the abdomen 01/2018  . Restless leg   . Sleep apnea    does not wear CPAP  . Wears glasses     SURGICAL HISTORY: Past Surgical History:  Procedure Laterality Date  . AMPUTATION TOE Right 11/05/2018   Procedure: Right 2nd toe ampuation;  Surgeon: Wylene Simmer, MD;  Location: [place];  Service: Orthopedics;  Laterality: Right;  88mn  . CHOLECYSTECTOMY    . COLONOSCOPY    . ESOPHAGOGASTRODUODENOSCOPY (EGD) WITH PROPOFOL Left 02/16/2018   Procedure: ESOPHAGOGASTRODUODENOSCOPY (EGD) WITH  PROPOFOL;  Surgeon: KRonnette Juniper MD;  [place]  Service: Gastroenterology;  Laterality: Left;  . LAPAROSCOPIC GASTRECTOMY N/A 03/12/2018   Procedure: LAPAROSCOPIC ASSISTED PARTIAL GASTRECTOMY ERAS PATHWAY;  Surgeon: BStark Klein MD;  Location[place]  Service: General;  Laterality: N/A;  . LAPAROSCOPIC PARTIAL GASTRECTOMY  03/12/2018   LAPAROSCOPIC ASSISTED PARTIAL GASTRECTOMY ERAS PATHWAY  . PROSTATECTOMY    . QUADRICEPS TENDON REPAIR Left 07/07/2017   Procedure: REPAIR QUADRICEP TENDON;  Surgeon: SRod Can MD;  [place]  Service: Orthopedics;  Laterality: Left;  . RIGHT CATARACT EXTRACTION  2013  . RIGTH NEPHRECTOMY  2006  . TONSILLECTOMY    . TRANSURETHRAL RESECTION OF PROSTATE  1997    I have reviewed the social history and family history with the patient and they are unchanged from previous note.  ALLERGIES:  is allergic to coconut flavor [flavoring agent]; coconut oil; and food.  MEDICATIONS:  Current Outpatient Medications  Medication Sig Dispense Refill  . Calcium Carbonate-Vitamin D (CALCIUM 500/D PO) Take 1 tablet by mouth daily. Unsure of dosage    . ferrous sulfate 325 (65 FE) MG tablet Take 325 mg by mouth as directed. 3 times a week    . gabapentin (NEURONTIN) 300 MG capsule Take 1 capsule (300 mg total) by mouth 3 (three) times daily. (Patient taking differently: Take 300 mg by mouth See admin instructions. Take 300 mg by mouth two to three times a day)    . imatinib (GLEEVEC) 100 MG  tablet Take 4 tablets (400 mg total) by mouth daily. Take with meals and large glass of water. 120 tablet 1  . MOVANTIK 25 MG TABS tablet Take 1 tablet by mouth daily.    . mupirocin ointment (BACTROBAN) 2 %     . neomycin-polymyxin b-dexamethasone (MAXITROL) 3.5-10000-0.1 SUSP     . oxymorphone (OPANA) 10 MG tablet Take 10 mg by mouth every 6 (six) hours as needed for pain.      No current facility-administered medications for this visit.    PHYSICAL  EXAMINATION: ECOG PERFORMANCE STATUS: 3 - Symptomatic, >50% confined to bed  Vitals:   03/02/20 1400  BP: (!) 139/59  Pulse: 65  Resp: 16  Temp: 98.9 F (37.2 C)  SpO2: 99%   Filed Weights   03/02/20 1400  Weight: 137 lb (62.1 kg)    GENERAL:alert, no distress and comfortable SKIN: skin color, texture, turgor are normal, no rashes or significant lesions (+) Healed skin wound on upper right thigh, tender (+) Mild skin rash in that area   EYES: normal, Conjunctiva are pink and non-injected, sclera clear  NECK: supple, thyroid normal size, non-tender, without nodularity LYMPH:  no palpable lymphadenopathy in the cervical, axillary  LUNGS: clear to auscultation and percussion with normal breathing effort HEART: regular rate & rhythm and no murmurs and no lower extremity edema ABDOMEN:abdomen soft, non-tender and normal bowel sounds Musculoskeletal:no cyanosis of digits and no clubbing  NEURO: alert & oriented x 3 with fluent speech, no focal motor/sensory deficits  LABORATORY DATA:  I have reviewed the data as listed CBC Latest Ref Rng & Units 03/02/2020 12/01/2019 09/22/2019  WBC 4.0 - 10.5 K/uL 3.5(L) 5.5 8.3  Hemoglobin 13.0 - 17.0 g/dL 11.1(L) 12.0(L) 11.9(L)  Hematocrit 39.0 - 52.0 % 34.4(L) 37.1(L) 35.8(L)  Platelets 150 - 400 K/uL 112(L) 136(L) 144(L)     CMP Latest Ref Rng & Units 03/02/2020 12/01/2019 09/22/2019  Glucose 70 - 99 mg/dL 97 96 97  BUN 8 - 23 mg/dL 15 22 26(H)  Creatinine 0.61 - 1.24 mg/dL 0.86 0.81 0.88  Sodium 135 - 145 mmol/L 138 139 140  Potassium 3.5 - 5.1 mmol/L 4.1 4.0 4.2  Chloride 98 - 111 mmol/L 105 106 105  CO2 22 - 32 mmol/L '26 26 26  '$ Calcium 8.9 - 10.3 mg/dL 8.2(L) 8.1(L) 9.2  Total Protein 6.5 - 8.1 g/dL 5.4(L) 5.9(L) 6.5  Total Bilirubin 0.3 - 1.2 mg/dL 0.6 0.4 0.7  Alkaline Phos 38 - 126 U/L 51 73 68  AST 15 - 41 U/L 38 40 50(H)  ALT 0 - 44 U/L '16 18 28      '$ RADIOGRAPHIC STUDIES: I have personally reviewed the radiological images  as listed and agreed with the findings in the report. No results found.   ASSESSMENT & PLAN:  Casey Gates is a 86 y.o. male with   1. Gastrointestinal Stromal Tumor (GIST) of the lesser curvature of the stomach, 13.8 cm, pT4pNX, G2, mitotic rate >20/50 high power field, stage IIIB -He was diagnosed in 01/2018. He is s/p tumor resection.Pathology revealed a large 13.8 cm GIST in the stomach. IHC was performed and the tumor is positive with CD117 (C-Kit). Genomic testing showed Kit Exon 11 mutation/insertion, which predicts benefitfromGleevec. -He has been on adjuvant Gleevec since 05/26/18. Plan for2years (instead of standard 3 years due to his advanced age). It was held in Sep 2020 due to thrombocytopenia. He has restarted in 09/22/2019, and increased dose to full dose '400mg'$  daily, tolerating  well, no signs of recurrent bleeding. -Labs reviewed, WBC 3.5, Hg 11.1, plt 112K, ANC 1.5, Ca 8.2. I dicussed he can continue Gleevec '400mg'$ . I discussed stopping after 2 years in 06/2020. He is agreeable. I will repeat CT scan at that time.  -F/u in 3-4 months then 6 months.   2.Hematochezia, resolved.No recurrence after returning to full dose Gleevec.    3. Anemia, iron deficient -Hehas been treated with blood transfusionin the past -Currently onoral iron therapy 1 tab daily beginning in 04/2018, continue daily.Stable and mild.    4. Chronic constipation -Related to chronicopioiduse withoxymorphone managed Dr. Nelva Bush -He does not tolerate Miralaxand senna, Lynzest andcolase does not work for him. -He uses enemas and OralMovantik with mild relief. He still may not have BM for up to 3 days. This concerns him and he is not eating as to try to avoid this and leaded to weight loss. -I recommended adding Miralax up from once daily to TID. If not enough he can use Magnesium Citrate as rescue medicine if no other options. He is willing to try.  -I encouraged him to increase nutritional  supplement, increase vegetables in diet and to work on gaining weight   5.Low appetite, Depression -Hepreviouslystopped Mirtazapine due to lack of benefit. -He has stress and concerns about contracting COVID.He plans to get Vaccine when it becomes available in his facility. I encouraged him to get it.  -He remains to have low appetite but able to eat adequately use nutritional supplement to maintain and gain weight.    6. S/p right 2nd toe amputation  7. Chronic Back pain -He has been on long term oxymorphone as needed for hischronicback pain, which is the main factor limiting his activities.He uses it TID now, no less than 5 hours a part.  8. Right thigh skin rash, healing  -He had infection from a cat scratch s/p antibiotics now. He has residual rash in that area and  I recommend OTC hydrocortisone and heating pad.   9. Hypocalcemia  -Ca 8.2 today (03/02/20).  -He has Calcium and Vit D but may forget to take it. I encouraged him to continue or use Calcium with Vit D in it. I also recommend he increase calcium in diet.    PLAN: -Continue Gleevec '400mg'$  daily for 3 more months then stop, refilled today  -Phone visit in 3-4 months with lab and CT CAP w contrast a few days before    No problem-specific Assessment & Plan notes found for this encounter.   Orders Placed This Encounter  Procedures  . CT Abdomen Pelvis W Contrast    Standing Status:   Future    Standing Expiration Date:   03/02/2021    Order Specific Question:   If indicated for the ordered procedure, I authorize the administration of contrast media per Radiology protocol    Answer:   Yes    Order Specific Question:   Preferred imaging location?    Answer:   [facility]    Order Specific Question:   Is Oral Contrast requested for this exam?    Answer:   Yes, Per Radiology protocol    Order Specific Question:   Radiology Contrast Protocol - do NOT remove file path    Answer:    \\[email or web address]  . CT Chest W Contrast    Standing Status:   Future    Standing Expiration Date:   03/02/2021    Order Specific Question:   If indicated for the ordered procedure,  I authorize the administration of contrast media per Radiology protocol    Answer:   Yes    Order Specific Question:   Preferred imaging location?    Answer:   [facility]    Order Specific Question:   Radiology Contrast Protocol - do NOT remove file path    Answer:   \\[email or web address]   All questions were answered. The patient knows to call the clinic with any problems, questions or concerns. No barriers to learning was detected. The total time spent in the appointment was 30 minutes.     Truitt Merle, MD 03/02/2020   I, Joslyn Devon, am acting as scribe for Truitt Merle, MD.   I have reviewed the above documentation for accuracy and completeness, and I agree with the above.

## 2020-02-29 MED FILL — IMATINIB MESYLATE 100 MG TA: 100 | 30 days supply | Qty: 120 | Fill #0

## 2020-03-01 ENCOUNTER — Ambulatory Visit: Payer: Medicare Other | Admitting: Hematology

## 2020-03-01 ENCOUNTER — Other Ambulatory Visit: Payer: Medicare Other

## 2020-03-02 ENCOUNTER — Inpatient Hospital Stay: Payer: Medicare PPO | Attending: Hematology

## 2020-03-02 ENCOUNTER — Inpatient Hospital Stay (HOSPITAL_BASED_OUTPATIENT_CLINIC_OR_DEPARTMENT_OTHER): Payer: Medicare PPO | Admitting: Hematology

## 2020-03-02 ENCOUNTER — Telehealth: Payer: Self-pay | Admitting: Hematology

## 2020-03-02 ENCOUNTER — Other Ambulatory Visit: Payer: Self-pay

## 2020-03-02 ENCOUNTER — Encounter: Payer: Self-pay | Admitting: Hematology

## 2020-03-02 VITALS — BP 139/59 | HR 65 | Temp 98.9°F | Resp 16 | Ht 71.0 in | Wt 137.0 lb

## 2020-03-02 DIAGNOSIS — C49A2 Gastrointestinal stromal tumor of stomach: Secondary | ICD-10-CM | POA: Insufficient documentation

## 2020-03-02 DIAGNOSIS — K5909 Other constipation: Secondary | ICD-10-CM | POA: Insufficient documentation

## 2020-03-02 DIAGNOSIS — M5136 Other intervertebral disc degeneration, lumbar region: Secondary | ICD-10-CM | POA: Diagnosis not present

## 2020-03-02 DIAGNOSIS — R630 Anorexia: Secondary | ICD-10-CM | POA: Insufficient documentation

## 2020-03-02 DIAGNOSIS — G4730 Sleep apnea, unspecified: Secondary | ICD-10-CM | POA: Diagnosis not present

## 2020-03-02 DIAGNOSIS — G894 Chronic pain syndrome: Secondary | ICD-10-CM | POA: Insufficient documentation

## 2020-03-02 DIAGNOSIS — J470 Bronchiectasis with acute lower respiratory infection: Secondary | ICD-10-CM | POA: Insufficient documentation

## 2020-03-02 DIAGNOSIS — D696 Thrombocytopenia, unspecified: Secondary | ICD-10-CM | POA: Diagnosis not present

## 2020-03-02 DIAGNOSIS — F329 Major depressive disorder, single episode, unspecified: Secondary | ICD-10-CM | POA: Diagnosis not present

## 2020-03-02 DIAGNOSIS — K449 Diaphragmatic hernia without obstruction or gangrene: Secondary | ICD-10-CM | POA: Insufficient documentation

## 2020-03-02 DIAGNOSIS — R21 Rash and other nonspecific skin eruption: Secondary | ICD-10-CM | POA: Diagnosis not present

## 2020-03-02 DIAGNOSIS — I700 Atherosclerosis of aorta: Secondary | ICD-10-CM | POA: Insufficient documentation

## 2020-03-02 DIAGNOSIS — I2510 Atherosclerotic heart disease of native coronary artery without angina pectoris: Secondary | ICD-10-CM | POA: Insufficient documentation

## 2020-03-02 DIAGNOSIS — D509 Iron deficiency anemia, unspecified: Secondary | ICD-10-CM | POA: Insufficient documentation

## 2020-03-02 DIAGNOSIS — M549 Dorsalgia, unspecified: Secondary | ICD-10-CM | POA: Diagnosis not present

## 2020-03-02 DIAGNOSIS — K222 Esophageal obstruction: Secondary | ICD-10-CM | POA: Diagnosis not present

## 2020-03-02 DIAGNOSIS — N189 Chronic kidney disease, unspecified: Secondary | ICD-10-CM | POA: Diagnosis not present

## 2020-03-02 DIAGNOSIS — K5900 Constipation, unspecified: Secondary | ICD-10-CM | POA: Insufficient documentation

## 2020-03-02 DIAGNOSIS — Z79899 Other long term (current) drug therapy: Secondary | ICD-10-CM | POA: Diagnosis not present

## 2020-03-02 LAB — CBC WITH DIFFERENTIAL (CANCER CENTER ONLY)
Abs Immature Granulocytes: 0 10*3/uL (ref 0.00–0.07)
Basophils Absolute: 0 10*3/uL (ref 0.0–0.1)
Basophils Relative: 1 %
Eosinophils Absolute: 0.3 10*3/uL (ref 0.0–0.5)
Eosinophils Relative: 9 %
HCT: 34.4 % — ABNORMAL LOW (ref 39.0–52.0)
Hemoglobin: 11.1 g/dL — ABNORMAL LOW (ref 13.0–17.0)
Immature Granulocytes: 0 %
Lymphocytes Relative: 35 %
Lymphs Abs: 1.2 10*3/uL (ref 0.7–4.0)
MCH: 32.3 pg (ref 26.0–34.0)
MCHC: 32.3 g/dL (ref 30.0–36.0)
MCV: 100 fL (ref 80.0–100.0)
Monocytes Absolute: 0.5 10*3/uL (ref 0.1–1.0)
Monocytes Relative: 13 %
Neutro Abs: 1.5 10*3/uL — ABNORMAL LOW (ref 1.7–7.7)
Neutrophils Relative %: 42 %
Platelet Count: 112 10*3/uL — ABNORMAL LOW (ref 150–400)
RBC: 3.44 MIL/uL — ABNORMAL LOW (ref 4.22–5.81)
RDW: 14.2 % (ref 11.5–15.5)
WBC Count: 3.5 10*3/uL — ABNORMAL LOW (ref 4.0–10.5)
nRBC: 0 % (ref 0.0–0.2)

## 2020-03-02 LAB — CMP (CANCER CENTER ONLY)
ALT: 16 U/L (ref 0–44)
AST: 38 U/L (ref 15–41)
Albumin: 3.5 g/dL (ref 3.5–5.0)
Alkaline Phosphatase: 51 U/L (ref 38–126)
Anion gap: 7 (ref 5–15)
BUN: 15 mg/dL (ref 8–23)
CO2: 26 mmol/L (ref 22–32)
Calcium: 8.2 mg/dL — ABNORMAL LOW (ref 8.9–10.3)
Chloride: 105 mmol/L (ref 98–111)
Creatinine: 0.86 mg/dL (ref 0.61–1.24)
GFR, Est AFR Am: >60 mL/min (ref >60)
GFR, Estimated: >60 mL/min (ref >60)
Glucose, Bld: 97 mg/dL (ref 70–99)
Potassium: 4.1 mmol/L (ref 3.5–5.1)
Sodium: 138 mmol/L (ref 135–145)
Total Bilirubin: 0.6 mg/dL (ref 0.3–1.2)
Total Protein: 5.4 g/dL — ABNORMAL LOW (ref 6.5–8.1)

## 2020-03-02 MED ORDER — IMATINIB MESYLATE 100 MG PO TABS: 400.0000 mg | ORAL_TABLET | Freq: Every day | ORAL | 1 refills | Status: DC

## 2020-03-02 NOTE — Telephone Encounter (Signed)
Scheduled appts per 4/1 los. Gave pt a print out of AVS.

## 2020-03-03 LAB — FERRITIN: Ferritin: 94 ng/mL (ref 24–336)

## 2020-03-23 MED FILL — IMATINIB MESYLATE 100 MG TA: 100 | 30 days supply | Qty: 120 | Fill #0

## 2020-03-28 ENCOUNTER — Telehealth: Payer: Self-pay

## 2020-03-28 NOTE — Telephone Encounter (Signed)
Mr. Jeppsen son left vm stating that Mr Kulczyk has been complaining of stomach pain after eating.  I returned Mr Alberda call.  He states his father is eating very little secondary to the stomach pain after eating.  He does not complain of nausea and has not vomited.  Mr Phou has been commenting "the cancer is back".  I told Mr Banner I would discuss this with Dr. Burr Medico tomorrow and cal him back.  He verbalized understanding.

## 2020-03-29 ENCOUNTER — Ambulatory Visit (INDEPENDENT_AMBULATORY_CARE_PROVIDER_SITE_OTHER): Payer: Self-pay | Admitting: Podiatry

## 2020-03-29 ENCOUNTER — Telehealth: Payer: Self-pay | Admitting: Hematology

## 2020-03-29 ENCOUNTER — Other Ambulatory Visit: Payer: Self-pay | Admitting: Hematology

## 2020-03-29 ENCOUNTER — Encounter: Payer: Self-pay | Admitting: Podiatry

## 2020-03-29 ENCOUNTER — Other Ambulatory Visit: Payer: Self-pay

## 2020-03-29 DIAGNOSIS — M79676 Pain in unspecified toe(s): Secondary | ICD-10-CM

## 2020-03-29 DIAGNOSIS — B351 Tinea unguium: Secondary | ICD-10-CM

## 2020-03-29 MED ORDER — PANTOPRAZOLE SODIUM 40 MG PO TBEC
40.0000 mg | DELAYED_RELEASE_TABLET | Freq: Every day | ORAL | 0 refills | Status: DC
Start: 2020-03-29 — End: 2020-03-30

## 2020-03-29 NOTE — Telephone Encounter (Signed)
I called pt's son [name] regarding his concerns of patient's epigastric pain.  I recommend him to try PPI first, I called in Protonix 40 mg daily to his pharmacy today.  I will move his neck scan to next 1-2 weeks, and I will see him after the scan.  If scan is negative, or he is not responding to PPI, I will refer him back to his GI Dr. [name] for endoscopy.  I encouraged [name] to call me back if he has any other further questions.  Truitt Merle  03/29/2020

## 2020-03-29 NOTE — Progress Notes (Signed)
This patient returns to the office for evaluation and treatment of long thick painful nails .  This patient is unable to trim his own nails since the patient cannot reach the feet.  Patient says the nails are painful walking and wearing his shoes.  He returns for preventive foot care services. Amputated toe right foot.  General Appearance  Alert, conversant and in no acute stress.  Vascular  Dorsalis pedis and posterior tibial  pulses are weakly  palpable  bilaterally.  Capillary return is within normal limits  bilaterally. Temperature is within normal limits  bilaterally.  Neurologic  Senn-Weinstein monofilament wire test within normal limits  bilaterally. Muscle power within normal limits bilaterally.  Nails Thick disfigured discolored nails with subungual debris  from hallux to fifth toes except amputated toe right foot.. No evidence of bacterial infection or drainage bilaterally.  Orthopedic  No limitations of motion  feet .  No crepitus or effusions noted.  No bony pathology or digital deformities noted.  Skin  normotropic skin with no porokeratosis noted bilaterally.  No signs of infections or ulcers noted.     Onychomycosis  Pain in toes right foot  Pain in toes left foot  Debridement  of nails  1-5 left and 1-4 right with a nail nipper.  Nails were then filed using a dremel tool with no incidents.    RTC 6 months.   Gardiner Barefoot DPM

## 2020-04-03 DIAGNOSIS — R238 Other skin changes: Secondary | ICD-10-CM | POA: Diagnosis not present

## 2020-04-03 DIAGNOSIS — D0359 Melanoma in situ of other part of trunk: Secondary | ICD-10-CM | POA: Diagnosis not present

## 2020-04-03 DIAGNOSIS — L300 Nummular dermatitis: Secondary | ICD-10-CM | POA: Diagnosis not present

## 2020-04-03 DIAGNOSIS — L0101 Non-bullous impetigo: Secondary | ICD-10-CM | POA: Diagnosis not present

## 2020-04-03 DIAGNOSIS — L989 Disorder of the skin and subcutaneous tissue, unspecified: Secondary | ICD-10-CM | POA: Diagnosis not present

## 2020-04-03 DIAGNOSIS — R21 Rash and other nonspecific skin eruption: Secondary | ICD-10-CM | POA: Diagnosis not present

## 2020-04-03 DIAGNOSIS — D485 Neoplasm of uncertain behavior of skin: Secondary | ICD-10-CM | POA: Diagnosis not present

## 2020-04-04 ENCOUNTER — Telehealth: Payer: Self-pay | Admitting: Hematology

## 2020-04-04 ENCOUNTER — Telehealth: Payer: Self-pay

## 2020-04-04 NOTE — Telephone Encounter (Signed)
I spoke with Mr Casey Gates's son Casey Gates I reviewed the CT scan appt and instructions.  I rescheduled Mr Casey Gates's appt with Dr. Burr Medico as Casey Gates is unable to bring him on Monday 04/10/2020

## 2020-04-04 NOTE — Telephone Encounter (Signed)
Scheduled appt per 5/4 sch message - unable to reach pt - left message with appt date and time   

## 2020-04-05 DIAGNOSIS — Z1159 Encounter for screening for other viral diseases: Secondary | ICD-10-CM | POA: Diagnosis not present

## 2020-04-05 DIAGNOSIS — Z20828 Contact with and (suspected) exposure to other viral communicable diseases: Secondary | ICD-10-CM | POA: Diagnosis not present

## 2020-04-05 DIAGNOSIS — H04123 Dry eye syndrome of bilateral lacrimal glands: Secondary | ICD-10-CM | POA: Diagnosis not present

## 2020-04-05 DIAGNOSIS — H0100B Unspecified blepharitis left eye, upper and lower eyelids: Secondary | ICD-10-CM | POA: Diagnosis not present

## 2020-04-05 DIAGNOSIS — H0100A Unspecified blepharitis right eye, upper and lower eyelids: Secondary | ICD-10-CM | POA: Diagnosis not present

## 2020-04-06 ENCOUNTER — Other Ambulatory Visit: Payer: Self-pay

## 2020-04-06 ENCOUNTER — Inpatient Hospital Stay: Payer: Medicare PPO | Attending: Hematology

## 2020-04-06 ENCOUNTER — Ambulatory Visit (HOSPITAL_COMMUNITY)
Admission: RE | Admit: 2020-04-06 | Discharge: 2020-04-06 | Disposition: A | Discharge status: Home / Self Care | Payer: Medicare PPO | Source: Ambulatory Visit | Attending: Hematology | Admitting: Hematology

## 2020-04-06 ENCOUNTER — Encounter (HOSPITAL_COMMUNITY): Payer: Self-pay

## 2020-04-06 DIAGNOSIS — G894 Chronic pain syndrome: Secondary | ICD-10-CM | POA: Diagnosis not present

## 2020-04-06 DIAGNOSIS — C49A2 Gastrointestinal stromal tumor of stomach: Secondary | ICD-10-CM | POA: Diagnosis not present

## 2020-04-06 DIAGNOSIS — K5900 Constipation, unspecified: Secondary | ICD-10-CM | POA: Diagnosis not present

## 2020-04-06 DIAGNOSIS — C269 Malignant neoplasm of ill-defined sites within the digestive system: Secondary | ICD-10-CM | POA: Diagnosis not present

## 2020-04-06 DIAGNOSIS — K449 Diaphragmatic hernia without obstruction or gangrene: Secondary | ICD-10-CM | POA: Insufficient documentation

## 2020-04-06 DIAGNOSIS — N189 Chronic kidney disease, unspecified: Secondary | ICD-10-CM | POA: Diagnosis not present

## 2020-04-06 DIAGNOSIS — Z8546 Personal history of malignant neoplasm of prostate: Secondary | ICD-10-CM | POA: Insufficient documentation

## 2020-04-06 DIAGNOSIS — I700 Atherosclerosis of aorta: Secondary | ICD-10-CM | POA: Insufficient documentation

## 2020-04-06 DIAGNOSIS — M549 Dorsalgia, unspecified: Secondary | ICD-10-CM | POA: Insufficient documentation

## 2020-04-06 DIAGNOSIS — D649 Anemia, unspecified: Secondary | ICD-10-CM | POA: Insufficient documentation

## 2020-04-06 DIAGNOSIS — R21 Rash and other nonspecific skin eruption: Secondary | ICD-10-CM | POA: Insufficient documentation

## 2020-04-06 DIAGNOSIS — I517 Cardiomegaly: Secondary | ICD-10-CM | POA: Insufficient documentation

## 2020-04-06 DIAGNOSIS — Z79899 Other long term (current) drug therapy: Secondary | ICD-10-CM | POA: Insufficient documentation

## 2020-04-06 DIAGNOSIS — R630 Anorexia: Secondary | ICD-10-CM | POA: Insufficient documentation

## 2020-04-06 DIAGNOSIS — D509 Iron deficiency anemia, unspecified: Secondary | ICD-10-CM | POA: Diagnosis not present

## 2020-04-06 DIAGNOSIS — I2510 Atherosclerotic heart disease of native coronary artery without angina pectoris: Secondary | ICD-10-CM | POA: Insufficient documentation

## 2020-04-06 DIAGNOSIS — F329 Major depressive disorder, single episode, unspecified: Secondary | ICD-10-CM | POA: Insufficient documentation

## 2020-04-06 DIAGNOSIS — K5909 Other constipation: Secondary | ICD-10-CM | POA: Insufficient documentation

## 2020-04-06 DIAGNOSIS — K222 Esophageal obstruction: Secondary | ICD-10-CM | POA: Insufficient documentation

## 2020-04-06 DIAGNOSIS — G4730 Sleep apnea, unspecified: Secondary | ICD-10-CM | POA: Diagnosis not present

## 2020-04-06 DIAGNOSIS — Z85028 Personal history of other malignant neoplasm of stomach: Secondary | ICD-10-CM | POA: Insufficient documentation

## 2020-04-06 DIAGNOSIS — Z9221 Personal history of antineoplastic chemotherapy: Secondary | ICD-10-CM | POA: Insufficient documentation

## 2020-04-06 DIAGNOSIS — C49A0 Gastrointestinal stromal tumor, unspecified site: Secondary | ICD-10-CM | POA: Diagnosis not present

## 2020-04-06 LAB — CMP (CANCER CENTER ONLY)
ALT: 22 U/L (ref 0–44)
AST: 44 U/L — ABNORMAL HIGH (ref 15–41)
Albumin: 3.6 g/dL (ref 3.5–5.0)
Alkaline Phosphatase: 58 U/L (ref 38–126)
Anion gap: 6 (ref 5–15)
BUN: 27 mg/dL — ABNORMAL HIGH (ref 8–23)
CO2: 28 mmol/L (ref 22–32)
Calcium: 8.7 mg/dL — ABNORMAL LOW (ref 8.9–10.3)
Chloride: 105 mmol/L (ref 98–111)
Creatinine: 0.96 mg/dL (ref 0.61–1.24)
GFR, Est AFR Am: >60 mL/min (ref >60)
GFR, Estimated: >60 mL/min (ref >60)
Glucose, Bld: 112 mg/dL — ABNORMAL HIGH (ref 70–99)
Potassium: 4.6 mmol/L (ref 3.5–5.1)
Sodium: 139 mmol/L (ref 135–145)
Total Bilirubin: 0.4 mg/dL (ref 0.3–1.2)
Total Protein: 5.6 g/dL — ABNORMAL LOW (ref 6.5–8.1)

## 2020-04-06 LAB — CBC WITH DIFFERENTIAL (CANCER CENTER ONLY)
Abs Immature Granulocytes: 0.01 10*3/uL (ref 0.00–0.07)
Basophils Absolute: 0 10*3/uL (ref 0.0–0.1)
Basophils Relative: 0 %
Eosinophils Absolute: 0 10*3/uL (ref 0.0–0.5)
Eosinophils Relative: 0 %
HCT: 36.9 % — ABNORMAL LOW (ref 39.0–52.0)
Hemoglobin: 11.8 g/dL — ABNORMAL LOW (ref 13.0–17.0)
Immature Granulocytes: 0 %
Lymphocytes Relative: 10 %
Lymphs Abs: 0.6 10*3/uL — ABNORMAL LOW (ref 0.7–4.0)
MCH: 32.1 pg (ref 26.0–34.0)
MCHC: 32 g/dL (ref 30.0–36.0)
MCV: 100.3 fL — ABNORMAL HIGH (ref 80.0–100.0)
Monocytes Absolute: 0.1 10*3/uL (ref 0.1–1.0)
Monocytes Relative: 2 %
Neutro Abs: 4.9 10*3/uL (ref 1.7–7.7)
Neutrophils Relative %: 88 %
Platelet Count: 136 10*3/uL — ABNORMAL LOW (ref 150–400)
RBC: 3.68 MIL/uL — ABNORMAL LOW (ref 4.22–5.81)
RDW: 14 % (ref 11.5–15.5)
WBC Count: 5.6 10*3/uL (ref 4.0–10.5)
nRBC: 0 % (ref 0.0–0.2)

## 2020-04-06 LAB — FERRITIN: Ferritin: 89 ng/mL (ref 24–336)

## 2020-04-06 MED ORDER — SODIUM CHLORIDE (PF) 0.9 % IJ SOLN
INTRAMUSCULAR | Status: AC
  Filled 2020-04-06: qty 50

## 2020-04-06 MED ORDER — IOHEXOL 300 MG/ML  SOLN
100.0000 mL | Freq: Once | INTRAMUSCULAR | Status: AC | PRN
  Administered 2020-04-06: 15:00:00 100 mL via INTRAVENOUS

## 2020-04-07 DIAGNOSIS — L309 Dermatitis, unspecified: Secondary | ICD-10-CM | POA: Diagnosis not present

## 2020-04-10 ENCOUNTER — Ambulatory Visit: Payer: Medicare PPO | Admitting: Hematology

## 2020-04-10 NOTE — Progress Notes (Signed)
Casey Gates   [phone] Fax:([phone]) [phone]   Clinic Follow up Note   Patient Care Team: Velna Hatchet, MD as PCP - General (Internal Medicine) [name], MD as PCP - Cardiology (Cardiology)  Date of Service:  04/12/2020  CHIEF COMPLAINT: F/u of GIST  SUMMARY OF ONCOLOGIC HISTORY: Oncology History Overview Note  Cancer Staging Malignant gastrointestinal stromal tumor (GIST) of stomach ([place]) Staging form: Gastrointestinal Stromal Tumor - Gastric and Omental GIST, AJCC 8th Edition - Pathologic stage from 04/22/2018: Stage IIIB (pT4, pN0, cM0, Mitotic Rate: High) - Signed by Alla Feeling, NP on 04/22/2018     Malignant gastrointestinal stromal tumor (GIST) of stomach ([place])  02/16/2018 Procedure   Upper EGD per Dr. [name] Impression:  - Normal upper third of esophagus and middle third of esophagus. - 5 cm hiatal hernia. - Widely patent Schatzki ring. - Clotted blood in the gastric body. - Rule out malignancy, gastric tumor on the lesser curvature of the stomach. Injected. Clips were placed. - Normal examined duodenum. - No specimens collected.   02/17/2018 Imaging   CT AP IMPRESSION: 1. 9.7 x 8.8 x 10.9 cm aggressive appearing heterogeneously enhancing mass along the lesser curvature of the stomach, highly concerning for primary gastric neoplasm. This is intimately associated with the undersurface of the left lobe of the liver without definitive evidence of direct invasion (although superficial involvement of the hepatic capsule is suspected given the loss of the intervening fat plane). There is also an indeterminate left adrenal nodule measuring 2.0 x 1.4 cm, which could be metastatic. No other definite signs of metastatic disease elsewhere in the abdomen or pelvis. 2. Areas of cylindrical bronchiectasis and chronic scarring in the lower lobes of the lungs bilaterally. 3. Aortic atherosclerosis, in addition to left main and 3 vessel coronary  artery disease. 4. Right kidney is either surgically or congenitally absent, or severely atrophic. 5. Additional incidental findings, as above.   02/19/2018 Initial Biopsy   VIA CT BIOPSY: Diagnosis -  Soft Tissue Needle Core Biopsy, gastric - FINDINGS CONSISTENT WITH GASTROINTESTINAL STROMAL TUMOR (GIST). - SEE COMMENT.   02/19/2018 Pathology Results   Diagnosis 02/19/2018 Soft Tissue Needle Core Biopsy, gastric - FINDINGS CONSISTENT WITH GASTROINTESTINAL STROMAL TUMOR (GIST).   03/12/2018 Initial Diagnosis   Malignant gastrointestinal stromal tumor (GIST) of stomach ([place])   03/12/2018 Pathology Results   Surgical path: Diagnosis Stomach, resection for tumor, Lesser curvature - GASTROINTESTINAL STROMAL TUMOR, 13.8 CM.   03/18/2018 Imaging   CT CAP IMPRESSION: -obtained d/t post op fever, leukocytosis, and respiratory change   Moderate right pleural effusion and small left pleural effusion. Compressive atelectasis versus pneumonia in the lower lobes. Nodular airspace disease throughout much of the right upper lobe and right middle lobe concerning for pneumonia.  Cardiomegaly. Severe/diffuse coronary artery disease. Aortic atherosclerosis. No evidence of aortic aneurysm.  Postoperative changes from recent partial gastrectomy. Small amount of free air in the abdomen, likely related to post op state. No evidence of bowel obstruction.  Prior remote right nephrectomy and prostatectomy.   03/22/2018 Procedure   Thoracentesis  Diagnosis PLEURAL FLUID, RIGHT (SPECIMEN 1 OF 1, COLLECTED ON 03/22/2018): NO MALIGNANT CELLS IDENTIFIED.     04/22/2018 Cancer Staging   Staging form: Gastrointestinal Stromal Tumor - Gastric and Omental GIST, AJCC 8th Edition - Pathologic stage from 04/22/2018: Stage IIIB (pT4, pN0, cM0, Mitotic Rate: High) - Signed by Alla Feeling, NP on 04/22/2018   05/03/2018 Imaging   05/03/2018 CT Abdomen and  Pelvis W/ Contrast IMPRESSION: CTA chest:  1. No  pulmonary embolus. 2. Debris within the right lower lobe bronchus with associated right lower lobe opacity, this can be seen with aspiration. 3. Improved right upper lobe aeration from prior consistent with resolving pneumonia. Near completely resolved right pleural effusion. 4. Chronic bilateral lower lobe bronchiectasis. Bilateral lower lobe airspace disease with some improvement from most recent prior. 5. Coronary artery calcifications. Aortic Atherosclerosis (ICD10-I70.0).  CT abdomen/pelvis:  1. Large colonic stool burden, suggesting constipation. No bowel obstruction. 2. Post recent partial gastrectomy without evident complication. No intra-abdominal abscess. 3. Additional stable chronic findings as described. Aortic Atherosclerosis (ICD10-I70.0).   05/03/2018 Imaging   05/03/2018 CT Angio Chest PE W or WO Contrast IMPRESSION: CTA chest:  1. No pulmonary embolus. 2. Debris within the right lower lobe bronchus with associated right lower lobe opacity, this can be seen with aspiration. 3. Improved right upper lobe aeration from prior consistent with resolving pneumonia. Near completely resolved right pleural effusion. 4. Chronic bilateral lower lobe bronchiectasis. Bilateral lower lobe airspace disease with some improvement from most recent prior. 5. Coronary artery calcifications. Aortic Atherosclerosis (ICD10-I70.0).   05/03/2018 Imaging   05/03/2018 CT CAP IMPRESSION: CTA chest: 1. No pulmonary embolus. 2. Debris within the right lower lobe bronchus with associated right lower lobe opacity, this can be seen with aspiration. 3. Improved right upper lobe aeration from prior consistent with resolving pneumonia. Near completely resolved right pleural effusion. 4. Chronic bilateral lower lobe bronchiectasis. Bilateral lower lobe airspace disease with some improvement from most recent prior. 5. Coronary artery calcifications. Aortic Atherosclerosis (ICD10-I70.0).  CT  abdomen/pelvis: 1. Large colonic stool burden, suggesting constipation. No bowel obstruction. 2. Post recent partial gastrectomy without evident complication. No intra-abdominal abscess. 3. Additional stable chronic findings as described. Aortic Atherosclerosis (ICD10-I70.0).    05/26/2018 - 05/31/2020 Chemotherapy   Adjuvant Gleevec, starting on 05/26/18 at '200mg'$  daily and gradually increased to '400mg'$  daily since 06/23/2018. Complete end of June 2021   11/19/2018 Imaging   CT CAP  IMPRESSION: 1. Stable exam. No new or progressive interval findings. No features to suggest metastatic disease in the chest, abdomen, or pelvis. 2. Bronchiectasis/scarring in the lung bases, similar to prior. 3. Stable small left adrenal nodule. 4.  Aortic Atherosclerois (ICD10-170.0)   04/14/2019 Imaging   IMPRESSION: Status post partial gastrectomy. No evidence of recurrent or metastatic disease.   Status post cholecystectomy. Status post right nephrectomy. Status post prostatectomy with pelvic lymph node dissection.   Additional stable ancillary findings as above.   04/06/2020 Imaging   CT CAP W contrast  IMPRESSION: Chest Impression:   1. No evidence of thoracic metastasis. 2. Coronary artery calcification and aortic atherosclerotic calcification. 3. Stable bronchiectasis at the lung bases.   Abdomen / Pelvis Impression:   1. No evidence of gastrointestinal stromal tumor recurrence. Partial gastrectomy anatomy. 2. No metastatic disease in the abdomen pelvis. 3. No bowel obstruction. 4. Post RIGHT nephrectomy.      CURRENT THERAPY:  Adjuvant Gleevec, startingon 6/25/19at '200mg'$  daily and gradually increased to '400mg'$  daily since7/23/2019. Held in 08/2019 given thrombocytopenia and GI bleeding.Restarted 09/22/19 at '200mg'$  daily.Increase '300mg'$  10/27/19 for 2 weeks then increase to '400mg'$ .   INTERVAL HISTORY:  Glyndon Tursi is here for a follow up of GIST. Her presents to the clinic with  his son. He notes he has been having stomach issues which make him feel uncomfortable. He feels him sitting around and worsening back pain impacts his abdomen. He feels currently  feels pressure of abdomen. He notes Protonix did not help him. In a comfortable position his abdomen hurts less. He is not sure if his back pain medication (with Oxycodone 65m up to 4 times a day) can help his stomach discomfort. His activity level he is able to get around his place with walker. He denies recent fall. He denies chest pain, cough or SOB. He notes stable chronic constipation. He uses enema most of the time for help.     REVIEW OF SYSTEMS:   Constitutional: Denies fevers, chills or abnormal weight loss Eyes: Denies blurriness of vision Ears, nose, mouth, throat, and face: Denies mucositis or sore throat Respiratory: Denies cough, dyspnea or wheezes Cardiovascular: Denies palpitation, chest discomfort or lower extremity swelling Gastrointestinal:  Denies nausea, heartburn (+) Constipation (+) Abdominal pressure and discomfort  Skin: Denies abnormal skin rashes Lymphatics: Denies new lymphadenopathy or easy bruising Neurological:Denies numbness, tingling or new weaknesses Behavioral/Psych: Mood is stable, no new changes  All other systems were reviewed with the patient and are negative.  MEDICAL HISTORY:  Past Medical History:  Diagnosis Date  . Anemia   . Asthma    AS A CHILD  . Blind left eye   . Cancer ([facility]    PROSTATE CANCER  . Cellulitis    frequently, "all over"  . Chronic kidney disease   . Chronic pain syndrome   . Chronic ulcer of toe, right, with unspecified severity ([place]    2nd toe  . Constipation   . DDD (degenerative disc disease), lumbar   . Gastrointestinal stromal tumor (GIST) of stomach ([place]   . GI bleeding   . History of blood transfusion    01/2018  . Insomnia   . Kyphoscoliosis   . Mass in the abdomen 01/2018  . Restless leg   . Sleep apnea    does not wear CPAP    . Wears glasses     SURGICAL HISTORY: Past Surgical History:  Procedure Laterality Date  . AMPUTATION TOE Right 11/05/2018   Procedure: Right 2nd toe ampuation;  Surgeon: HWylene Simmer MD;  [place] [facility]  Service: Orthopedics;  Laterality: Right;  679m  . CHOLECYSTECTOMY    . COLONOSCOPY    . ESOPHAGOGASTRODUODENOSCOPY (EGD) WITH PROPOFOL Left 02/16/2018   Procedure: ESOPHAGOGASTRODUODENOSCOPY (EGD) WITH PROPOFOL;  Surgeon: KaRonnette JuniperMD;  [place] [facility] Service: G[place]ology;  Laterality: Left;  . LAPAROSCOPIC GASTRECTOMY N/A 03/12/2018   Procedure: LAPAROSCOPIC ASSISTED PARTIAL GASTRECTOMY ERAS PATHWAY;  Surgeon: ByStark KleinMD;  [facility] Service: General;  Laterality: N/A;  . LAPAROSCOPIC PARTIAL GASTRECTOMY  03/12/2018   LAPAROSCOPIC ASSISTED PARTIAL GASTRECTOMY ERAS PATHWAY  . PROSTATECTOMY    . QUADRICEPS TENDON REPAIR Left 07/07/2017   Procedure: REPAIR QUADRICEP TENDON;  Surgeon: SwRod CanMD;  [place] [facility] Service: Orthopedics;  Laterality: Left;  . RIGHT CATARACT EXTRACTION  2013  . RIGTH NEPHRECTOMY  2006  . TONSILLECTOMY    . TRANSURETHRAL RESECTION OF PROSTATE  1997    I have reviewed the social history and family history with the patient and they are unchanged from previous note.  ALLERGIES:  is allergic to coconut flavor [flavoring agent]; coconut oil; doxycycline hyclate; and food.  MEDICATIONS:  Current Outpatient Medications  Medication Sig Dispense Refill  . Carboxymethylcellulose Sodium (LUBRICANT EYE DROPS OP) Apply 1 drop to eye at bedtime.    . Vladimir Fasterlycol-Propyl Glycol (SYSTANE) 0.4-0.3 % GEL ophthalmic gel Place 1 application into both eyes 2 (  two) times daily.    . Calcium Carbonate-Vitamin D (CALCIUM 500/D PO) Take 1 tablet by mouth daily. Unsure of dosage    . clobetasol ointment (TEMOVATE) 0.05 % Apply  a small amount to affected area twice a day as needed    . ferrous sulfate 325 (65  FE) MG tablet Take 325 mg by mouth as directed. 3 times a week    . gabapentin (NEURONTIN) 300 MG capsule Take 1 capsule (300 mg total) by mouth 3 (three) times daily. (Patient taking differently: Take 300 mg by mouth See admin instructions. Take 300 mg by mouth two to three times a day)    . imatinib (GLEEVEC) 100 MG tablet Take 4 tablets (400 mg total) by mouth daily. Take with meals and large glass of water. 120 tablet 0  . MOVANTIK 25 MG TABS tablet Take 1 tablet by mouth daily.    . mupirocin ointment (BACTROBAN) 2 %     . neomycin-polymyxin b-dexamethasone (MAXITROL) 3.5-10000-0.1 SUSP     . oxymorphone (OPANA) 10 MG tablet Take 10 mg by mouth every 6 (six) hours as needed for pain.     . pantoprazole (PROTONIX) 40 MG tablet TAKE 1 TABLET(40 MG) BY MOUTH DAILY 90 tablet 0  . predniSONE (DELTASONE) 10 MG tablet Take 3 tabs for 5 days Take 2 tabs for 5 days Take 1 tab for 5 days Take 1/2 tab for 5 days    . triamcinolone cream (KENALOG) 0.1 %     . triamcinolone ointment (KENALOG) 0.1 % Apply to body BID daily     No current facility-administered medications for this visit.    PHYSICAL EXAMINATION: ECOG PERFORMANCE STATUS: 3 - Symptomatic, >50% confined to bed  Vitals:   04/12/20 1136  BP: 125/61  Pulse: 65  Resp: 18  Temp: 98.5 F (36.9 C)  SpO2: 96%   Filed Weights   04/12/20 1136  Weight: 140 lb 9.6 oz (63.8 kg)    Due to COVID19 we will limit examination to appearance. Patient had no complaints.  GENERAL:alert, no distress and comfortable SKIN: skin color normal, no rashes or significant lesions EYES: normal, Conjunctiva are pink and non-injected, sclera clear  NEURO: alert & oriented x 3 with fluent speech   LABORATORY DATA:  I have reviewed the data as listed CBC Latest Ref Rng & Units 04/06/2020 03/02/2020 12/01/2019  WBC 4.0 - 10.5 K/uL 5.6 3.5(L) 5.5  Hemoglobin 13.0 - 17.0 g/dL 11.8(L) 11.1(L) 12.0(L)  Hematocrit 39.0 - 52.0 % 36.9(L) 34.4(L) 37.1(L)  Platelets  150 - 400 K/uL 136(L) 112(L) 136(L)     CMP Latest Ref Rng & Units 04/06/2020 03/02/2020 12/01/2019  Glucose 70 - 99 mg/dL 112(H) 97 96  BUN 8 - 23 mg/dL 27(H) 15 22  Creatinine 0.61 - 1.24 mg/dL 0.96 0.86 0.81  Sodium 135 - 145 mmol/L 139 138 139  Potassium 3.5 - 5.1 mmol/L 4.6 4.1 4.0  Chloride 98 - 111 mmol/L 105 105 106  CO2 22 - 32 mmol/L _0 Calcium 8.9 - 10.3 mg/dL 8.7(L) 8.2(L) 8.1(L)  Total Protein 6.5 - 8.1 g/dL 5.6(L) 5.4(L) 5.9(L)  Total Bilirubin 0.3 - 1.2 mg/dL 0.4 0.6 0.4  Alkaline Phos 38 - 126 U/L 58 51 73  AST 15 - 41 U/L 44(H) 38 40  ALT 0 - 44 U/L _1 RADIOGRAPHIC STUDIES: I have personally reviewed the radiological images as listed and agreed with the findings in the report.  No results found.   ASSESSMENT & PLAN:  Casey Gates is a 86 y.o. male with    1. Gastrointestinal Stromal Tumor (GIST) of the lesser curvature of the stomach, 13.8 cm, pT4pNX, G2, mitotic rate >20/50 high power field, stage IIIB -He was diagnosed in 01/2018. He is s/p tumor resection.Pathology revealed a large 13.8 cm GIST in the stomach. IHC was performed and the tumor is positive with CD117 (C-Kit). Genomic testing showed Kit Exon 11 mutation/insertion, which predicts benefitfromGleevec. -He has been on adjuvant Gleevec since 05/26/18. Plan for2years (instead of standard 3 years due to his advanced age). It was held in Sep 2020 due to thrombocytopenia. He has restartedin 09/22/2019,and increased dose to full dose '400mg'$  daily,tolerating well, no signs ofrecurrentbleeding. -He has been having abdominal discomfort based on position, activity, and high food intake. He did not respond to Protonix, so he can stop. Pt feels it's related to his back pain  -His CT CAP from 04/06/20 shows NED and no other concerning abnormality. Labs from 04/06/20 show Hg 11.8, plt 136K, BG 112, BUN 27, Ca 8.7, AST 44.  -I discussed proceeding with Endoscopy for further evaluation with Dr [name]  which would require anesthesia. He would like to wait for now. I suspect this is related to his chronic constipation but he can follow up with Dr. [name] for evaluation if need. Pt feels his abdominal pain is stable and manageable.   -He will continue Gleevec and complete his 2 years in 6 weeks by the end of June.  -f/u in 3 months    2.Hematochezia, resolved.No recurrence after returning to fulldoseGleevec.   3. Anemia, iron deficient -Hehas been treated with blood transfusionin the past -Currently onoral iron therapy 1 tab daily beginning in 04/2018, continue daily.Stable and mild.   4. Chronic constipation -Related to chronicopioiduse withoxymorphone managed Dr. Nelva Bush -He does not tolerate Magnesium Citrate, Miralaxand senna, Lynzest andcolase does not work for him. -He uses enemas and OralMovantik with mild relief. He still may not have BM for up to 3 days. This concerns him and he is not eating as to try to avoid this and leaded to weight loss.  5.Low appetite, Depression -Hepreviouslystopped Mirtazapine due to lack of benefit. -He has stress and concerns about contracting COVID.He plans to get Vaccine when it becomes available in his facility. I encouraged him to get it.  -He remains to have low appetite but able to eat adequately usenutritional supplementto maintain and gain weight.   6. S/p right 2nd toe amputation  7. Chronic Back pain -He has been on long term oxymorphone as needed for hischronicback pain, which is the main factor limiting his activities.He uses it TID now, no less than 5 hours a part.  8. Right thigh skin rash, healing  -He had infection from a cat scratch s/p antibiotics now. He has residual rash in that area and  I recommend OTC hydrocortisone and heating pad.  -Not mentioned today, likely resolved now.   9. Hypocalcemia  -He has Calcium and Vit D but may forget to take it. I encouraged him to continue or use Calcium  with Vit D in it.    PLAN: -CT CAP reviewed, NED -Continue Gleevec '400mg'$  daily and stop at the end of June (he will complete 2 years therapy) -Lab and F/u in 3 months    No problem-specific Assessment & Plan notes found for this encounter.   No orders of the defined types were placed in this encounter.  All questions  were answered. The patient knows to call the clinic with any problems, questions or concerns. No barriers to learning was detected. The total time spent in the appointment was 30 minutes.     Truitt Merle, MD 04/12/2020   I, Joslyn Devon, am acting as scribe for Truitt Merle, MD.   I have reviewed the above documentation for accuracy and completeness, and I agree with the above.

## 2020-04-12 ENCOUNTER — Telehealth: Payer: Self-pay | Admitting: Hematology

## 2020-04-12 ENCOUNTER — Encounter: Payer: Self-pay | Admitting: Hematology

## 2020-04-12 ENCOUNTER — Inpatient Hospital Stay: Payer: Medicare PPO | Admitting: Hematology

## 2020-04-12 ENCOUNTER — Other Ambulatory Visit: Payer: Self-pay

## 2020-04-12 VITALS — BP 125/61 | HR 65 | Temp 98.5°F | Resp 18 | Ht 71.0 in | Wt 140.6 lb

## 2020-04-12 DIAGNOSIS — C49A2 Gastrointestinal stromal tumor of stomach: Secondary | ICD-10-CM

## 2020-04-12 DIAGNOSIS — Z20828 Contact with and (suspected) exposure to other viral communicable diseases: Secondary | ICD-10-CM | POA: Diagnosis not present

## 2020-04-12 DIAGNOSIS — K5900 Constipation, unspecified: Secondary | ICD-10-CM | POA: Diagnosis not present

## 2020-04-12 DIAGNOSIS — I517 Cardiomegaly: Secondary | ICD-10-CM | POA: Diagnosis not present

## 2020-04-12 DIAGNOSIS — I2510 Atherosclerotic heart disease of native coronary artery without angina pectoris: Secondary | ICD-10-CM | POA: Diagnosis not present

## 2020-04-12 DIAGNOSIS — K449 Diaphragmatic hernia without obstruction or gangrene: Secondary | ICD-10-CM | POA: Diagnosis not present

## 2020-04-12 DIAGNOSIS — K222 Esophageal obstruction: Secondary | ICD-10-CM | POA: Diagnosis not present

## 2020-04-12 DIAGNOSIS — Z1159 Encounter for screening for other viral diseases: Secondary | ICD-10-CM | POA: Diagnosis not present

## 2020-04-12 DIAGNOSIS — Z85028 Personal history of other malignant neoplasm of stomach: Secondary | ICD-10-CM | POA: Diagnosis not present

## 2020-04-12 DIAGNOSIS — D649 Anemia, unspecified: Secondary | ICD-10-CM | POA: Diagnosis not present

## 2020-04-12 DIAGNOSIS — I700 Atherosclerosis of aorta: Secondary | ICD-10-CM | POA: Diagnosis not present

## 2020-04-12 DIAGNOSIS — N189 Chronic kidney disease, unspecified: Secondary | ICD-10-CM | POA: Diagnosis not present

## 2020-04-12 MED ORDER — IMATINIB MESYLATE 100 MG PO TABS: 400.0000 mg | ORAL_TABLET | Freq: Every day | ORAL | 0 refills | Status: DC

## 2020-04-12 NOTE — Telephone Encounter (Signed)
Scheduled per los. Patient declined printout  

## 2020-04-19 DIAGNOSIS — Z20828 Contact with and (suspected) exposure to other viral communicable diseases: Secondary | ICD-10-CM | POA: Diagnosis not present

## 2020-04-19 DIAGNOSIS — Z1159 Encounter for screening for other viral diseases: Secondary | ICD-10-CM | POA: Diagnosis not present

## 2020-04-20 DIAGNOSIS — M5136 Other intervertebral disc degeneration, lumbar region: Secondary | ICD-10-CM | POA: Diagnosis not present

## 2020-04-20 DIAGNOSIS — Z79891 Long term (current) use of opiate analgesic: Secondary | ICD-10-CM | POA: Diagnosis not present

## 2020-04-24 MED FILL — IMATINIB MESYLATE 100 MG TA: 100 | 30 days supply | Qty: 120 | Fill #1

## 2020-04-26 DIAGNOSIS — Z1159 Encounter for screening for other viral diseases: Secondary | ICD-10-CM | POA: Diagnosis not present

## 2020-04-26 DIAGNOSIS — Z20828 Contact with and (suspected) exposure to other viral communicable diseases: Secondary | ICD-10-CM | POA: Diagnosis not present

## 2020-05-03 DIAGNOSIS — Z20828 Contact with and (suspected) exposure to other viral communicable diseases: Secondary | ICD-10-CM | POA: Diagnosis not present

## 2020-05-03 DIAGNOSIS — Z1159 Encounter for screening for other viral diseases: Secondary | ICD-10-CM | POA: Diagnosis not present

## 2020-05-04 DIAGNOSIS — C4359 Malignant melanoma of other part of trunk: Secondary | ICD-10-CM | POA: Diagnosis not present

## 2020-05-04 DIAGNOSIS — L905 Scar conditions and fibrosis of skin: Secondary | ICD-10-CM | POA: Diagnosis not present

## 2020-05-10 DIAGNOSIS — Z1159 Encounter for screening for other viral diseases: Secondary | ICD-10-CM | POA: Diagnosis not present

## 2020-05-10 DIAGNOSIS — Z20828 Contact with and (suspected) exposure to other viral communicable diseases: Secondary | ICD-10-CM | POA: Diagnosis not present

## 2020-05-17 DIAGNOSIS — Z1159 Encounter for screening for other viral diseases: Secondary | ICD-10-CM | POA: Diagnosis not present

## 2020-05-17 DIAGNOSIS — Z20828 Contact with and (suspected) exposure to other viral communicable diseases: Secondary | ICD-10-CM | POA: Diagnosis not present

## 2020-05-31 ENCOUNTER — Inpatient Hospital Stay: Payer: Medicare PPO

## 2020-05-31 DIAGNOSIS — R21 Rash and other nonspecific skin eruption: Secondary | ICD-10-CM | POA: Diagnosis not present

## 2020-05-31 DIAGNOSIS — F330 Major depressive disorder, recurrent, mild: Secondary | ICD-10-CM | POA: Diagnosis not present

## 2020-05-31 DIAGNOSIS — K5909 Other constipation: Secondary | ICD-10-CM | POA: Diagnosis not present

## 2020-05-31 DIAGNOSIS — G894 Chronic pain syndrome: Secondary | ICD-10-CM | POA: Diagnosis not present

## 2020-05-31 DIAGNOSIS — Z20828 Contact with and (suspected) exposure to other viral communicable diseases: Secondary | ICD-10-CM | POA: Diagnosis not present

## 2020-05-31 DIAGNOSIS — Z1159 Encounter for screening for other viral diseases: Secondary | ICD-10-CM | POA: Diagnosis not present

## 2020-06-01 ENCOUNTER — Ambulatory Visit: Payer: Medicare PPO | Admitting: Hematology

## 2020-06-14 DIAGNOSIS — Z1159 Encounter for screening for other viral diseases: Secondary | ICD-10-CM | POA: Diagnosis not present

## 2020-06-14 DIAGNOSIS — Z20828 Contact with and (suspected) exposure to other viral communicable diseases: Secondary | ICD-10-CM | POA: Diagnosis not present

## 2020-06-21 DIAGNOSIS — Z1159 Encounter for screening for other viral diseases: Secondary | ICD-10-CM | POA: Diagnosis not present

## 2020-06-21 DIAGNOSIS — Z20828 Contact with and (suspected) exposure to other viral communicable diseases: Secondary | ICD-10-CM | POA: Diagnosis not present

## 2020-06-25 ENCOUNTER — Encounter (HOSPITAL_COMMUNITY): Payer: Self-pay

## 2020-06-25 ENCOUNTER — Emergency Department (HOSPITAL_COMMUNITY)
Admission: EM | Admit: 2020-06-25 | Discharge: 2020-06-26 | Disposition: A | Discharge status: Home / Self Care | Payer: Medicare PPO | Attending: Emergency Medicine | Admitting: Emergency Medicine

## 2020-06-25 ENCOUNTER — Emergency Department (HOSPITAL_COMMUNITY): Payer: Medicare PPO

## 2020-06-25 ENCOUNTER — Other Ambulatory Visit: Payer: Self-pay

## 2020-06-25 DIAGNOSIS — Z743 Need for continuous supervision: Secondary | ICD-10-CM | POA: Diagnosis not present

## 2020-06-25 DIAGNOSIS — Z8546 Personal history of malignant neoplasm of prostate: Secondary | ICD-10-CM | POA: Insufficient documentation

## 2020-06-25 DIAGNOSIS — W19XXXA Unspecified fall, initial encounter: Secondary | ICD-10-CM

## 2020-06-25 DIAGNOSIS — M25561 Pain in right knee: Secondary | ICD-10-CM | POA: Insufficient documentation

## 2020-06-25 DIAGNOSIS — I4891 Unspecified atrial fibrillation: Secondary | ICD-10-CM | POA: Insufficient documentation

## 2020-06-25 DIAGNOSIS — Y939 Activity, unspecified: Secondary | ICD-10-CM | POA: Diagnosis not present

## 2020-06-25 DIAGNOSIS — J45909 Unspecified asthma, uncomplicated: Secondary | ICD-10-CM | POA: Insufficient documentation

## 2020-06-25 DIAGNOSIS — J9811 Atelectasis: Secondary | ICD-10-CM | POA: Diagnosis not present

## 2020-06-25 DIAGNOSIS — N189 Chronic kidney disease, unspecified: Secondary | ICD-10-CM | POA: Diagnosis not present

## 2020-06-25 DIAGNOSIS — W010XXA Fall on same level from slipping, tripping and stumbling without subsequent striking against object, initial encounter: Secondary | ICD-10-CM | POA: Insufficient documentation

## 2020-06-25 DIAGNOSIS — Y929 Unspecified place or not applicable: Secondary | ICD-10-CM | POA: Insufficient documentation

## 2020-06-25 DIAGNOSIS — Y999 Unspecified external cause status: Secondary | ICD-10-CM | POA: Diagnosis not present

## 2020-06-25 DIAGNOSIS — R52 Pain, unspecified: Secondary | ICD-10-CM | POA: Diagnosis not present

## 2020-06-25 DIAGNOSIS — R0902 Hypoxemia: Secondary | ICD-10-CM | POA: Diagnosis not present

## 2020-06-25 DIAGNOSIS — G8911 Acute pain due to trauma: Secondary | ICD-10-CM | POA: Diagnosis not present

## 2020-06-25 MED ORDER — OXYCODONE-ACETAMINOPHEN 5-325 MG PO TABS
1.0000 | ORAL_TABLET | Freq: Once | ORAL | Status: AC
  Administered 2020-06-25: 1 via ORAL
  Filled 2020-06-25: qty 1

## 2020-06-25 MED ORDER — BACITRACIN ZINC 500 UNIT/GM EX OINT
TOPICAL_OINTMENT | Freq: Two times a day (BID) | CUTANEOUS | Status: DC
  Administered 2020-06-26: 1 via TOPICAL
  Filled 2020-06-25: qty 0.9

## 2020-06-25 NOTE — ED Triage Notes (Signed)
Pt BIB EMS from [facility]. Pt c/o right knee pain x5 days. Pt fell last Tuesday 7/20. Pt c/o left buttock pain. Pt not on blood thinners. Pt normally ambulatory with walker at baseline. Pt A&Ox4.   135/75 95 HR 94% RA GCS 15

## 2020-06-25 NOTE — ED Provider Notes (Signed)
[facility] DEPT Provider Note   CSN: [id number] Arrival date & time: 06/25/20  2207     History Chief Complaint  Patient presents with  . Fall    Casey Gates is a 86 y.o. male presents to ER by EMS from [facility] for evaluation of right knee pain. Patient states he was arranging things in his closet on Tuesday and he fell backwards landing on his buttock and right knee.  Pain was mild in the knee but has worsened in the last 2 days. Worse with movement, weight bearing. Usually walks with walker and states he was using walker during the fall.  Unsure of what happened but thinks he just lost balance.  Has been walking still with walker but with pain.  Also reports a wound in the left buttock that is very "sore". He states it took him 1.5 hr to get up from the floor after the fall and was crawling on his buttock on the floor to the chair to get up. Eventually got up on his own and went to sleep. States he forgot he could've pushed the buzzer to get help.  Has been putting ointment on left buttock wound.  Denies prodromal chest pain, palpitations, light headedness during fall.  States he may have hit his head but he didn't pass out. No headache, neck pain.  No anticoagulants.  Chronic back pain that he takes pain medicine for, unchanged.  Denies any other physical injuries from fall.  Has been feeling well and at his baseline the last few days.  States he has an appointment with dermatologist tomorrow morning for this rash he has had around his left nipple. This is red with honey crusting over it and tender. Was prescribed mupirocin ointment for it.   HPI     Past Medical History:  Diagnosis Date  . Anemia   . Asthma    AS A CHILD  . Blind left eye   . Cancer [facility])    PROSTATE CANCER  . Cellulitis    frequently, "all over"  . Chronic kidney disease   . Chronic pain syndrome   . Chronic ulcer of toe, right, with unspecified severity ([place])    2nd toe    . Constipation   . DDD (degenerative disc disease), lumbar   . Gastrointestinal stromal tumor (GIST) of stomach ([place])   . GI bleeding   . History of blood transfusion    01/2018  . Insomnia   . Kyphoscoliosis   . Mass in the abdomen 01/2018  . Restless leg   . Sleep apnea    does not wear CPAP  . Wears glasses     Patient Active Problem List   Diagnosis Date Noted  . Back pain, chronic 12/14/2018  . Edema 12/14/2018  . Prostate cancer ([place]) 12/14/2018  . History of amputation of lesser toe ([place]) 11/16/2018  . Chronic ulcer of right foot with fat layer exposed ([place]) 11/05/2018  . Contusion of right hip region 07/24/2018  . Multifocal pneumonia 03/18/2018  . Sepsis ([place]) 03/18/2018  . Malignant gastrointestinal stromal tumor (GIST) of stomach ([place]) 03/12/2018  . Urinary retention 02/24/2018  . Gastrointestinal stromal tumor (GIST) of stomach ([place])   . Restless leg syndrome   . Acute GI bleeding 02/16/2018  . Acute blood loss anemia 02/16/2018  . Chronic low back pain 02/05/2018  . Degeneration of lumbar intervertebral disc 02/05/2018  . Constipation 01/20/2018  . Exertional shortness of breath 12/30/2017  . Rupture  of left quadriceps muscle 07/07/2017  . Rupture of left quadriceps tendon 06/26/2017  . Eczema 06/26/2017  . Chest pain 07/29/2015  . OSA on CPAP 07/10/2015  . CPAP use counseling 07/10/2015  . Excessive daytime sleepiness 02/22/2015  . Sleep apnea 02/22/2015  . Snoring 02/22/2015  . Nocturia more than twice per night 02/22/2015  . Syncope 12/31/2012  . Bifascicular bundle branch block 12/31/2012    Class: Acute  . Cellulitis and abscess of leg, except foot 12/04/2012  . Chronic pain 12/04/2012  . Kyphosis 12/04/2012  . H/O unilateral nephrectomy 12/04/2012  . H/O prostate cancer 12/04/2012  . S/P TURP 12/04/2012    Past Surgical History:  Procedure Laterality Date  . AMPUTATION TOE Right 11/05/2018   Procedure: Right 2nd toe ampuation;  Surgeon:  Wylene Simmer, MD;  Location: [place];  Service: Orthopedics;  Laterality: Right;  50min  . CHOLECYSTECTOMY    . COLONOSCOPY    . ESOPHAGOGASTRODUODENOSCOPY (EGD) WITH PROPOFOL Left 02/16/2018   Procedure: ESOPHAGOGASTRODUODENOSCOPY (EGD) WITH PROPOFOL;  Surgeon: [name], MD;  Location: [facility];  Service: Gastroenterology;  Laterality: Left;  . LAPAROSCOPIC GASTRECTOMY N/A 03/12/2018   Procedure: LAPAROSCOPIC ASSISTED PARTIAL GASTRECTOMY ERAS PATHWAY;  Surgeon: Stark Klein, MD;  Location: [place];  Service: General;  Laterality: N/A;  . LAPAROSCOPIC PARTIAL GASTRECTOMY  03/12/2018   LAPAROSCOPIC ASSISTED PARTIAL GASTRECTOMY ERAS PATHWAY  . PROSTATECTOMY    . QUADRICEPS TENDON REPAIR Left 07/07/2017   Procedure: REPAIR QUADRICEP TENDON;  Surgeon: Rod Can, MD;  Location: [place];  Service: Orthopedics;  Laterality: Left;  . RIGHT CATARACT EXTRACTION  2013  . RIGTH NEPHRECTOMY  2006  . TONSILLECTOMY    . TRANSURETHRAL RESECTION OF PROSTATE  1997       Family History  Problem Relation Age of Onset  . Bone cancer Mother   . Hypertension Mother   . Arthritis Mother   . Clotting disorder Daughter        died of blood clot     Social History   Tobacco Use  . Smoking status: Never Smoker  . Smokeless tobacco: Never Used  Vaping Use  . Vaping Use: Never used  Substance Use Topics  . Alcohol use: No    Alcohol/week: 0.0 standard drinks    Comment: quit 1985, 2 fifths/ week,S/P rehab  . Drug use: No    Home Medications Prior to Admission medications   Medication Sig Start Date End Date Taking? Authorizing Provider  Calcium Carbonate-Vitamin D (CALCIUM 500/D PO) Take 1 tablet by mouth daily. Unsure of dosage    [provider]  Carboxymethylcellulose Sodium (LUBRICANT EYE DROPS OP) Apply 1 drop to eye at bedtime.    [provider]  clobetasol ointment (TEMOVATE) 0.05 % Apply  a small amount to affected area twice a day as needed  03/31/20   [provider]  diltiazem (CARDIZEM SR) 120 MG 12 hr capsule Take 1 capsule (120 mg total) by mouth 2 (two) times daily. 06/26/20 07/26/20  Kinnie Feil, PA-C  ferrous sulfate 325 (65 FE) MG tablet Take 325 mg by mouth as directed. 3 times a week    [provider]  gabapentin (NEURONTIN) 300 MG capsule Take 1 capsule (300 mg total) by mouth 3 (three) times daily. Patient taking differently: Take 300 mg by mouth See admin instructions. Take 300 mg by mouth two to three times a day 03/24/18   Katherine Roan, MD  imatinib (GLEEVEC) 100 MG tablet Take  4 tablets (400 mg total) by mouth daily. Take with meals and large glass of water. 04/12/20   Truitt Merle, MD  MOVANTIK 25 MG TABS tablet Take 1 tablet by mouth daily. 09/10/19   [provider]  mupirocin ointment (BACTROBAN) 2 %  02/17/20   [provider]  neomycin-polymyxin b-dexamethasone (MAXITROL) 3.5-10000-0.1 SUSP  02/02/20   [provider]  oxymorphone (OPANA) 10 MG tablet Take 10 mg by mouth every 6 (six) hours as needed for pain.  07/03/18   [provider]  pantoprazole (PROTONIX) 40 MG tablet TAKE 1 TABLET(40 MG) BY MOUTH DAILY 03/30/20   Truitt Merle, MD  Polyethyl Glycol-Propyl Glycol (SYSTANE) 0.4-0.3 % GEL ophthalmic gel Place 1 application into both eyes 2 (two) times daily.    [provider]  predniSONE (DELTASONE) 10 MG tablet Take 3 tabs for 5 days Take 2 tabs for 5 days Take 1 tab for 5 days Take 1/2 tab for 5 days 04/03/20   [provider]  triamcinolone cream (KENALOG) 0.1 %  03/24/20   [provider]  triamcinolone ointment (KENALOG) 0.1 % Apply to body BID daily 04/03/20   [provider]    Allergies    Coconut flavor [flavoring agent], Coconut oil, Doxycycline hyclate, and Food  Review of Systems   Review of Systems  Musculoskeletal: Positive for arthralgias and gait problem.  Skin: Positive for wound.  All other systems  reviewed and are negative.   Physical Exam Updated Vital Signs BP (!) 140/77   Pulse 98   Temp 98.4 F (36.9 C) (Oral)   Resp 18   SpO2 95%   Physical Exam Vitals and nursing note reviewed.  Constitutional:      General: He is not in acute distress.    Appearance: He is well-developed.     Comments: NAD.  HENT:     Head: Normocephalic and atraumatic.     Right Ear: External ear normal.     Left Ear: External ear normal.     Nose: Nose normal.  Eyes:     General: No scleral icterus.    Conjunctiva/sclera: Conjunctivae normal.  Cardiovascular:     Rate and Rhythm: Tachycardia present. Rhythm irregular.     Heart sounds: Normal heart sounds.     Comments: 1+ DP pulses bilaterally. No LE edema. No calf tenderness.  Pulmonary:     Effort: Pulmonary effort is normal.     Breath sounds: Normal breath sounds.  Musculoskeletal:        General: Tenderness present. Normal range of motion.     Cervical back: Normal range of motion and neck supple.     Comments:  TL spine: chronic spine curved deformity ?scoliosis. No midline or paraspinal tenderness. No back contusions  Pelvis: stable, full ROM of hips bilaterally.   Right knee: mild lateral joint line tenderness. No quad/patellar tendon, patellar or tibial tuberosity tenderness.  Normal J tracking of patella.  No joint laxity.  No joint contusions, abrasions, edema, erythema, fluctuance. Full ROM of knee without pain.  Patient able to sit up on bed, rearrange himself without assistance or obvious discomfort   Skin:    General: Skin is warm and dry.     Capillary Refill: Capillary refill takes less than 2 seconds.     Findings: Abrasion present.     Comments: Quarter sized circular abrasion left buttock mildly locally tender, mildly erythematous. No edema, warmth, fluctuance, drainage.    Neurological:  Mental Status: He is alert and oriented to person, place, and time.     Comments: Alert and oriented to self, place, year,  events.  Speech is fluent.  Strength 5/5 with hand grip and ankle F/E.  Sensation to light touch intact in face, hands and feet. Sits on side of the bed without truncal sway. No pronator drift. No leg drop. CN I-III not tested.  CN IV-XII grossly normal.   Psychiatric:        Behavior: Behavior normal.        Thought Content: Thought content normal.        Judgment: Judgment normal.     ED Results / Procedures / Treatments   Labs (all labs ordered are listed, but only abnormal results are displayed) Labs Reviewed  BASIC METABOLIC PANEL - Abnormal; Notable for the following components:      Result Value   Glucose, Bld 112 (*)    All other components within normal limits  CBC - Abnormal; Notable for the following components:   RBC 3.63 (*)    Hemoglobin 11.8 (*)    HCT 37.3 (*)    MCV 102.8 (*)    All other components within normal limits  MAGNESIUM  TSH    EKG EKG Interpretation  Date/Time:  Sunday June 25 2020 23:58:45 EDT Ventricular Rate:  115 PR Interval:    QRS Duration: 129 QT Interval:  338 QTC Calculation: 468 R Axis:   -81 Text Interpretation: Atrial fibrillation RBBB and LAFB Confirmed by Virgel Manifold [phone]) on 06/26/2020 12:09:46 AM   Radiology DG Chest 1 View  Result Date: 06/26/2020 CLINICAL DATA:  New onset AFib EXAM: CHEST  1 VIEW COMPARISON:  May 03, 2018 FINDINGS: The heart size is stable. There is some atelectasis at the lung bases. There is no pneumothorax. No significant pleural effusion. There is a rounded density at the left lung base measuring approximately 1.6 cm. This is favored to represent a nipple shadow given the lack of a significant finding on the patient's recent CT chest. IMPRESSION: No acute cardiopulmonary process. Electronically Signed   By: Constance Holster M.D.   On: 06/26/2020 00:31   DG Knee Complete 4 Views Right  Result Date: 06/25/2020 CLINICAL DATA:  Recent fall with right knee pain, initial encounter EXAM: RIGHT KNEE -  COMPLETE 4+ VIEW COMPARISON:  None. FINDINGS: No evidence of fracture, dislocation, or joint effusion. No evidence of arthropathy or other focal bone abnormality. Soft tissues are unremarkable. IMPRESSION: No acute abnormality noted. Electronically Signed   By: Inez Catalina M.D.   On: 06/25/2020 22:59    Procedures Procedures (including critical care time)  Medications Ordered in ED Medications  bacitracin ointment (1 application Topical Given 06/26/20 0000)  oxyCODONE-acetaminophen (PERCOCET/ROXICET) 5-325 MG per tablet 1 tablet (1 tablet Oral Given 06/25/20 2345)  diltiazem (CARDIZEM) injection 10 mg (10 mg Intravenous Given 06/26/20 0110)    ED Course  I have reviewed the triage vital signs and the nursing notes.  Pertinent labs & imaging results that were available during my care of the patient were reviewed by me and considered in my medical decision making (see chart for details).  Clinical Course as of Jun 27 323  Nancy Fetter Jun 25, 2020  2326 Pulse Rate(!): 121 [CG]  Mon Jun 26, 2020  0150 Pulse Rate(!): 121 [CG]  0150 Pulse Rate: 89 [CG]  0150 BP: 118/77 [CG]  0150 BP: 124/82 [CG]    Clinical Course User Index [CG] Carmon Sails  Morton Stall   MDM Rules/Calculators/A&P                          86 year old male presents after what he describes as a mechanical fall 5 days ago.  Comes in for evaluation of ongoing right knee pain.  Hemodynamically stable on arrival.  Alert.  Right knee exam is reassuring.  I have ordered right knee x-ray, Percocet, knee sleeve.  Right buttock wound is superficial, not infected likely from floor/rug burn.  He has been putting petrolatum ointment.  Discussed wound care instructions. Will place dressing prior to discharge.   He denies any other physical injury.  No signs of significant head, CTL spine, pelvis injury.  No anticoagulants.  He thinks he may have hit his head but denies loss of consciousness.  No significant signs of facial or scalp bone  injury.  Normal neuro exam.  Doubt significant intracranial injury or bleed.    He denies any red flags to suggest more occult or serious reason for the fall, prodromal symptoms. However HR 121, noted irregular on exam.  EKG shows atrial fibrillation HR 120s.  Patient denies palpitations, chest pain, light headedness, syncope, shortness of breath.  Patient and son deny known history of atrial fibrillation.  No pleuritic CP. No recent illnesses.  On aspirin daily. No recent illnesses. Denies respiratory or urinary symptoms.  EMR reviewed, seen by cardiology 2019 for atypical CP. Echo showed no LV dysfunction, mild LVG and diastolic dysfunction, mild AI.  Cardiology did not pursue further cardiac testing or LHC.   Will obtain labs, TSH, mag, chest x-ray. Ordered continuous cardiac monitor, pulse ox. Will give cardizem, reassess.   0015: Patient shared with EDP [place].  Will monitor HR response.  If stable, asymptomatic will dc with afib clinic follow up and cardizem SR.  Patient not considered good candidate for [facility] given age, history of falls and GIB. Not good candidate for CV here. He is asymptomatic, HD stable, and unsure duration/onset of afib. CHAD2DS2VASC score is 2, already on aspirin.   0320: ER work up personally reviewed and interpreted.  Minimal anemia, stable. Electrolytes normal. TSH normal. HR significantly improved in 80-90s.  Remains asymptomatic. I personally ambulated patient with RN at bedside. He got out of bed and ambulated to door and back without assistance. States he feels "great'. Son at bedside states his gait is unchanged. HR remained <100. He denies palpitations, light headedness.   Will dc with cardizem SR, afib follow up. Discussed symptoms that would warrant return to ER. Patient eager to be discharged. Son states he will assist with fu with afib clinic.   Final Clinical Impression(s) / ED Diagnoses Final diagnoses:  Fall, initial encounter  Acute pain of right knee    Atrial fibrillation, unspecified type [facility])    Rx / DC Orders ED Discharge Orders         Ordered    Amb referral to AFIB Clinic     Discontinue  Reprint     06/26/20 0005    diltiazem (CARDIZEM SR) 120 MG 12 hr capsule  2 times daily     Discontinue     06/26/20 Benedict, Neha Waight J, PA-C 06/26/20 8144    Virgel Manifold, MD 06/26/20 1626

## 2020-06-25 NOTE — ED Notes (Signed)
Pt to xray

## 2020-06-26 ENCOUNTER — Emergency Department (HOSPITAL_COMMUNITY): Payer: Medicare PPO

## 2020-06-26 DIAGNOSIS — J9811 Atelectasis: Secondary | ICD-10-CM | POA: Diagnosis not present

## 2020-06-26 LAB — CBC
HCT: 37.3 % — ABNORMAL LOW (ref 39.0–52.0)
Hemoglobin: 11.8 g/dL — ABNORMAL LOW (ref 13.0–17.0)
MCH: 32.5 pg (ref 26.0–34.0)
MCHC: 31.6 g/dL (ref 30.0–36.0)
MCV: 102.8 fL — ABNORMAL HIGH (ref 80.0–100.0)
Platelets: 162 10*3/uL (ref 150–400)
RBC: 3.63 MIL/uL — ABNORMAL LOW (ref 4.22–5.81)
RDW: 12.4 % (ref 11.5–15.5)
WBC: 6.8 10*3/uL (ref 4.0–10.5)
nRBC: 0 % (ref 0.0–0.2)

## 2020-06-26 LAB — BASIC METABOLIC PANEL
Anion gap: 9 (ref 5–15)
BUN: 21 mg/dL (ref 8–23)
CO2: 28 mmol/L (ref 22–32)
Calcium: 8.9 mg/dL (ref 8.9–10.3)
Chloride: 104 mmol/L (ref 98–111)
Creatinine, Ser: 0.97 mg/dL (ref 0.61–1.24)
GFR calc Af Amer: >60 mL/min (ref >60)
GFR calc non Af Amer: >60 mL/min (ref >60)
Glucose, Bld: 112 mg/dL — ABNORMAL HIGH (ref 70–99)
Potassium: 4.5 mmol/L (ref 3.5–5.1)
Sodium: 141 mmol/L (ref 135–145)

## 2020-06-26 LAB — MAGNESIUM: Magnesium: 2.2 mg/dL (ref 1.7–2.4)

## 2020-06-26 LAB — TSH: TSH: 3.046 u[IU]/mL (ref 0.350–4.500)

## 2020-06-26 MED ORDER — DILTIAZEM HCL ER 120 MG PO CP12
120.0000 mg | ORAL_CAPSULE | Freq: Two times a day (BID) | ORAL | 0 refills | Status: DC
Start: 2020-06-26 — End: 2020-07-14

## 2020-06-26 MED ORDER — DILTIAZEM HCL 25 MG/5ML IV SOLN
10.0000 mg | Freq: Once | INTRAVENOUS | Status: AC
  Administered 2020-06-26: 10 mg via INTRAVENOUS
  Filled 2020-06-26: qty 5

## 2020-06-26 NOTE — ED Provider Notes (Signed)
Medical screening examination/treatment/procedure(s) were conducted as a shared visit with non-physician practitioner(s) and myself.  I personally evaluated the patient during the encounter.  EKG Interpretation  Date/Time:  Sunday June 25 2020 23:58:45 EDT Ventricular Rate:  115 PR Interval:    QRS Duration: 129 QT Interval:  338 QTC Calculation: 468 R Axis:   -81 Text Interpretation: Atrial fibrillation RBBB and LAFB Confirmed by Virgel Manifold [phone]) on 06/26/2020 12:70:86 AM  86 year old male with right knee pain.  Mechanical fall 5 days ago.  Persistent pain in his knee and left buttock since then.  He scooted on his buttock try to get himself up.  Her rate noted to be in the 120s.  Irregular.  EKG showing atrial fibrillation.  This appears to be a new diagnosis.  Unclear onset based on symptoms.  He seems completely asymptomatic with regards to this though.  Deferring from anticoagulation given advanced age, fall risk and history of upper GI bleed.  Will obtain basic labs and TSH.  Assuming we can get rate reasonably controlled will discharge on Cardizem and have him follow-up in atrial fibrillation clinic.  Imaging of his right knee w/o acute abnormality.   Virgel Manifold, MD 06/26/20 [phone]

## 2020-06-26 NOTE — Discharge Instructions (Signed)
You were seen in the ER after a fall with right knee pain. X-ray was normal.  Likely a contusion. Take tylenol, ice, wear knee brace. Follow up with primary care doctor if knee pain is not improving  Your heart was in irregular rhythm (atrial fibrillation).  You were given cardizem and heart rate improved.  Please take cardizem as prescribed. Referral to atrial fibrillation clinic sent. Please call them   Atrial Fibrillation Clinic at [facility] [place], [place] [address]

## 2020-06-28 ENCOUNTER — Ambulatory Visit (HOSPITAL_COMMUNITY)
Admission: RE | Admit: 2020-06-28 | Discharge: 2020-06-28 | Disposition: A | Discharge status: Home / Self Care | Payer: Medicare PPO | Source: Ambulatory Visit | Attending: Nurse Practitioner | Admitting: Nurse Practitioner

## 2020-06-28 ENCOUNTER — Other Ambulatory Visit: Payer: Self-pay

## 2020-06-28 ENCOUNTER — Encounter (HOSPITAL_COMMUNITY): Payer: Self-pay | Admitting: Nurse Practitioner

## 2020-06-28 VITALS — BP 110/46 | HR 92 | Ht 71.0 in | Wt 130.6 lb

## 2020-06-28 DIAGNOSIS — G4730 Sleep apnea, unspecified: Secondary | ICD-10-CM | POA: Insufficient documentation

## 2020-06-28 DIAGNOSIS — Z8719 Personal history of other diseases of the digestive system: Secondary | ICD-10-CM | POA: Insufficient documentation

## 2020-06-28 DIAGNOSIS — Z1159 Encounter for screening for other viral diseases: Secondary | ICD-10-CM | POA: Diagnosis not present

## 2020-06-28 DIAGNOSIS — Z9181 History of falling: Secondary | ICD-10-CM | POA: Diagnosis not present

## 2020-06-28 DIAGNOSIS — Z79899 Other long term (current) drug therapy: Secondary | ICD-10-CM | POA: Insufficient documentation

## 2020-06-28 DIAGNOSIS — D509 Iron deficiency anemia, unspecified: Secondary | ICD-10-CM | POA: Insufficient documentation

## 2020-06-28 DIAGNOSIS — I4891 Unspecified atrial fibrillation: Secondary | ICD-10-CM | POA: Insufficient documentation

## 2020-06-28 DIAGNOSIS — N189 Chronic kidney disease, unspecified: Secondary | ICD-10-CM | POA: Diagnosis not present

## 2020-06-28 DIAGNOSIS — Z8249 Family history of ischemic heart disease and other diseases of the circulatory system: Secondary | ICD-10-CM | POA: Insufficient documentation

## 2020-06-28 DIAGNOSIS — Z20828 Contact with and (suspected) exposure to other viral communicable diseases: Secondary | ICD-10-CM | POA: Diagnosis not present

## 2020-06-28 DIAGNOSIS — Z89421 Acquired absence of other right toe(s): Secondary | ICD-10-CM | POA: Insufficient documentation

## 2020-06-28 MED ORDER — APIXABAN 2.5 MG PO TABS: 2.5000 mg | ORAL_TABLET | Freq: Two times a day (BID) | ORAL | 3 refills | Status: DC

## 2020-06-28 NOTE — Progress Notes (Signed)
Primary Care Physician: Velna Hatchet, MD Referring Physician: [facility] ER  F/U    Casey Gates is a 86 y.o. male with  that lives independently at [facility] and was evaluated in the ER for a mechanical  Fall 06/25/20 . He was arranging things in his closet and fell backward landing on his buttock and striking his rt knee. He had to scoot on his bottom to get to  a chair. This resulted in an abrasion to his buttock.This process took around 1.5 hours. His EKG showed afib with RVR, which is a new finding.Pt was asymptomatic.  ER physician conferred with cardiology. They did not recommend any further testing. Suggested rate control. He was not determined to be a good candidate for anticoagulation due to advanced age and falls, previous GI bleed.   In the office today, pt is with his son who is primarily the historian for the pt. He states that he had a GI bleed in the past due to a tumor and once he had surgery to remove this he has not had any further bleeding issues. He has had this one recent  fall but no other falls that he is aware. Ekg  shows today that he is rate controlled. He is not on daily asa. He has had both covid shots.    Today, he denies symptoms of palpitations, chest pain, shortness of breath, orthopnea, PND, lower extremity edema, dizziness, presyncope, syncope, or neurologic sequela. The patient is tolerating medications without difficulties and is otherwise without complaint today.   Past Medical History:  Diagnosis Date  . Anemia   . Asthma    AS A CHILD  . Blind left eye   . Cancer [facility])    PROSTATE CANCER  . Cellulitis    frequently, "all over"  . Chronic kidney disease   . Chronic pain syndrome   . Chronic ulcer of toe, right, with unspecified severity ([place])    2nd toe  . Constipation   . DDD (degenerative disc disease), lumbar   . Gastrointestinal stromal tumor (GIST) of stomach ([place])   . GI bleeding   . History of blood transfusion    01/2018  . Insomnia     . Kyphoscoliosis   . Mass in the abdomen 01/2018  . Restless leg   . Sleep apnea    does not wear CPAP  . Wears glasses    Past Surgical History:  Procedure Laterality Date  . AMPUTATION TOE Right 11/05/2018   Procedure: Right 2nd toe ampuation;  Surgeon: Wylene Simmer, MD;  Location: [place];  Service: Orthopedics;  Laterality: Right;  24min  . CHOLECYSTECTOMY    . COLONOSCOPY    . ESOPHAGOGASTRODUODENOSCOPY (EGD) WITH PROPOFOL Left 02/16/2018   Procedure: ESOPHAGOGASTRODUODENOSCOPY (EGD) WITH PROPOFOL;  Surgeon: [name], MD;  Location: [place];  Service: Gastroenterology;  Laterality: Left;  . LAPAROSCOPIC GASTRECTOMY N/A 03/12/2018   Procedure: LAPAROSCOPIC ASSISTED PARTIAL GASTRECTOMY ERAS PATHWAY;  Surgeon: Stark Klein, MD;  Location: [place];  Service: General;  Laterality: N/A;  . LAPAROSCOPIC PARTIAL GASTRECTOMY  03/12/2018   LAPAROSCOPIC ASSISTED PARTIAL GASTRECTOMY ERAS PATHWAY  . PROSTATECTOMY    . QUADRICEPS TENDON REPAIR Left 07/07/2017   Procedure: REPAIR QUADRICEP TENDON;  Surgeon: Rod Can, MD;  Location: [place];  Service: Orthopedics;  Laterality: Left;  . RIGHT CATARACT EXTRACTION  2013  . RIGTH NEPHRECTOMY  2006  . TONSILLECTOMY    . TRANSURETHRAL RESECTION OF PROSTATE  1997    Current  Outpatient Medications  Medication Sig Dispense Refill  . Carboxymethylcellulose Sodium (LUBRICANT EYE DROPS OP) Apply 1 drop to eye at bedtime.    Marland Kitchen diltiazem (CARDIZEM SR) 120 MG 12 hr capsule Take 1 capsule (120 mg total) by mouth 2 (two) times daily. 60 capsule 0  . gabapentin (NEURONTIN) 300 MG capsule Take 1 capsule (300 mg total) by mouth 3 (three) times daily. (Patient taking differently: Take 300 mg by mouth See admin instructions. Take 300 mg by mouth two to three times a day)    . mirtazapine (REMERON) 30 MG tablet Take 30 mg by mouth at bedtime.    Marland Kitchen MOVANTIK 25 MG TABS tablet Take 1 tablet by mouth daily.    . mupirocin ointment (BACTROBAN)  2 %     . oxymorphone (OPANA) 10 MG tablet Take 10 mg by mouth every 6 (six) hours as needed for pain.     Vladimir Faster Glycol-Propyl Glycol (SYSTANE) 0.4-0.3 % GEL ophthalmic gel Place 1 application into both eyes 2 (two) times daily.    Marland Kitchen apixaban (ELIQUIS) 2.5 MG TABS tablet Take 1 tablet (2.5 mg total) by mouth 2 (two) times daily. 60 tablet 3   No current facility-administered medications for this encounter.    Allergies  Allergen Reactions  . Coconut Flavor [Flavoring Agent] Hypertension    Anything that is related to coconut---LOSS OF CONSCIOUSNESS ([place]NCOPE)  . Coconut Oil Other (See Comments)    Anything that is related to coconut--- LOSS OF CONSCIOUSNESS (SYNCOPE)  . Doxycycline Hyclate Hives  . Food Anaphylaxis    ALLERGIC TO ALL NUTS WITH THE EXCEPTION OF CASHEWS, ALMONDS    Social History   Socioeconomic History  . Marital status: Widowed    Spouse name: Not on file  . Number of children: 2  . Years of education: Not on file  . Highest education level: Not on file  Occupational History  . Not on file  Tobacco Use  . Smoking status: Never Smoker  . Smokeless tobacco: Never Used  Vaping Use  . Vaping Use: Never used  Substance and Sexual Activity  . Alcohol use: No    Alcohol/week: 0.0 standard drinks    Comment: quit 1985, 2 fifths/ week,S/P rehab  . Drug use: No  . Sexual activity: Never  Other Topics Concern  . Not on file  Social History Narrative   Caffeine 2-3 cups average.  Widowed,  Lives at [facility], [place]. Living.  Retired Forensic psychologist.  One son.   Social Determinants of Health   Financial Resource Strain:   . Difficulty of Paying Living Expenses:   Food Insecurity:   . Worried About Charity fundraiser in the Last Year:   . Arboriculturist in the Last Year:   Transportation Needs:   . Film/video editor (Medical):   Marland Kitchen Lack of Transportation (Non-Medical):   Physical Activity:   . Days of Exercise per Week:   . Minutes of Exercise  per Session:   Stress:   . Feeling of Stress :   Social Connections:   . Frequency of Communication with Friends and Family:   . Frequency of Social Gatherings with Friends and Family:   . Attends Religious Services:   . Active Member of Clubs or Organizations:   . Attends Archivist Meetings:   Marland Kitchen Marital Status:   Intimate Partner Violence:   . Fear of Current or Ex-Partner:   . Emotionally Abused:   Marland Kitchen Physically Abused:   .  Sexually Abused:     Family History  Problem Relation Age of Onset  . Bone cancer Mother   . Hypertension Mother   . Arthritis Mother   . Clotting disorder Daughter        died of blood clot     ROS- All systems are reviewed and negative except as per the HPI above  Physical Exam: Vitals:   06/28/20 1015  BP: (!) 110/46  Pulse: 92  Weight: 59.2 kg  Height: 5\' 11"  (1.803 m)   Wt Readings from Last 3 Encounters:  06/28/20 59.2 kg  04/12/20 63.8 kg  03/02/20 62.1 kg    Labs: Lab Results  Component Value Date   NA 141 06/26/2020   K 4.5 06/26/2020   CL 104 06/26/2020   CO2 28 06/26/2020   GLUCOSE 112 (H) 06/26/2020   BUN 21 06/26/2020   CREATININE 0.97 06/26/2020   CALCIUM 8.9 06/26/2020   PHOS 2.9 12/31/2017   MG 2.2 06/26/2020   Lab Results  Component Value Date   INR 1.43 03/18/2018   No results found for: CHOL, HDL, LDLCALC, TRIG   GEN- The patient is well appearing, alert and oriented x 3 today.   Head- normocephalic, atraumatic Eyes-  Sclera clear, conjunctiva pink Ears- hearing intact Oropharynx- clear Neck- supple, no JVP Lymph- no cervical lymphadenopathy Lungs- Clear to ausculation bilaterally, normal work of breathing Heart- irregular rate and rhythm, no murmurs, rubs or gallops, PMI not laterally displaced GI- soft, NT, ND, + BS Extremities- no clubbing, cyanosis, or edema MS- no significant deformity or atrophy Skin- no rash or lesion Psych- euthymic mood, full affect Neuro- strength and sensation  are intact  EKG-afib at 92 bpm, qrs int 134 ms, qtc 472 ms  ER records reviewed   Assessment and Plan: 1. New onset afib  General  education re afib  Pt seems to be asymptomatic  He is rate controlled  Continue  cardizem 120 mg bid   2. CHA2DS2VASc score of 2 (age)  Discussed with pt/son  that ER MD did not start anticoagulation as he did not feel he was a candidate for advanced age, risk of falls and previous GI bleed The son state that this is his first fall and surgery corrected the prior GI bleed and no further bleeding since then.  We discussed risk vrs benefit of anticoagulation and that he has a 5x greater risk of having a stroke now in afib The pt then spoke up and he stated that he did not want a stroke and wanted to go on blood thinner.  Therefore, will start eliquis at the lower dose of 2.5 mg bid (age over 32, weight less than 60 kg)  He is chronically anemic but appears stable, plts normal  I will see back in 3 weeks for a repeat cbc/bmet Advised to watch for signs of bleeding  in urine and stool  If he does fall again and strikes his head , that is an automatic ER visit to evaluate for early cerebral bleeding They are aware there is an immediate  IV reversal agent for eliquis if needed   I will send Dr. Ardeth Perfect my note and inquire if he has  any knowledge that would negate the start of eliquis   Otherwise, I will see the pt back in 3 weeks  Butch Penny C. Aldina Porta, [facility] [address] [place], [place] [address] [phone]

## 2020-06-28 NOTE — Patient Instructions (Signed)
Start Eliquis 2.5mg  twice day

## 2020-06-30 ENCOUNTER — Other Ambulatory Visit: Payer: Self-pay | Admitting: Hematology

## 2020-07-05 DIAGNOSIS — Z1159 Encounter for screening for other viral diseases: Secondary | ICD-10-CM | POA: Diagnosis not present

## 2020-07-05 DIAGNOSIS — Z20828 Contact with and (suspected) exposure to other viral communicable diseases: Secondary | ICD-10-CM | POA: Diagnosis not present

## 2020-07-10 NOTE — Progress Notes (Signed)
[place]   [phone] [phone]   Clinic Follow up Note   Patient Care Team: Velna Hatchet, MD as PCP - General (Internal Medicine) [name], MD as PCP - Cardiology (Cardiology)  Date of Service:  07/13/2020  CHIEF COMPLAINT: F/u of GIST  SUMMARY OF ONCOLOGIC HISTORY: Oncology History Overview Note  Cancer Staging Malignant gastrointestinal stromal tumor (GIST) of stomach ([place]) Staging form: Gastrointestinal Stromal Tumor - Gastric and Omental GIST, AJCC 8th Edition - Pathologic stage from 04/22/2018: Stage IIIB (pT4, pN0, cM0, Mitotic Rate: High) - Signed by Alla Feeling, NP on 04/22/2018     Malignant gastrointestinal stromal tumor (GIST) of stomach ([place])  02/16/2018 Procedure   Upper EGD per Dr. [name] Impression:  - Normal upper third of esophagus and middle third of esophagus. - 5 cm hiatal hernia. - Widely patent Schatzki ring. - Clotted blood in the gastric body. - Rule out malignancy, gastric tumor on the lesser curvature of the stomach. Injected. Clips were placed. - Normal examined duodenum. - No specimens collected.   02/17/2018 Imaging   CT AP IMPRESSION: 1. 9.7 x 8.8 x 10.9 cm aggressive appearing heterogeneously enhancing mass along the lesser curvature of the stomach, highly concerning for primary gastric neoplasm. This is intimately associated with the undersurface of the left lobe of the liver without definitive evidence of direct invasion (although superficial involvement of the hepatic capsule is suspected given the loss of the intervening fat plane). There is also an indeterminate left adrenal nodule measuring 2.0 x 1.4 cm, which could be metastatic. No other definite signs of metastatic disease elsewhere in the abdomen or pelvis. 2. Areas of cylindrical bronchiectasis and chronic scarring in the lower lobes of the lungs bilaterally. 3. Aortic atherosclerosis, in addition to left main and 3 vessel coronary  artery disease. 4. Right kidney is either surgically or congenitally absent, or severely atrophic. 5. Additional incidental findings, as above.   02/19/2018 Initial Biopsy   VIA CT BIOPSY: Diagnosis -  Soft Tissue Needle Core Biopsy, gastric - FINDINGS CONSISTENT WITH GASTROINTESTINAL STROMAL TUMOR (GIST). - SEE COMMENT.   02/19/2018 Pathology Results   Diagnosis 02/19/2018 Soft Tissue Needle Core Biopsy, gastric - FINDINGS CONSISTENT WITH GASTROINTESTINAL STROMAL TUMOR (GIST).   03/12/2018 Initial Diagnosis   Malignant gastrointestinal stromal tumor (GIST) of stomach ([place])   03/12/2018 Pathology Results   Surgical path: Diagnosis Stomach, resection for tumor, Lesser curvature - GASTROINTESTINAL STROMAL TUMOR, 13.8 CM.   03/18/2018 Imaging   CT CAP IMPRESSION: -obtained d/t post op fever, leukocytosis, and respiratory change   Moderate right pleural effusion and small left pleural effusion. Compressive atelectasis versus pneumonia in the lower lobes. Nodular airspace disease throughout much of the right upper lobe and right middle lobe concerning for pneumonia.  Cardiomegaly. Severe/diffuse coronary artery disease. Aortic atherosclerosis. No evidence of aortic aneurysm.  Postoperative changes from recent partial gastrectomy. Small amount of free air in the abdomen, likely related to post op state. No evidence of bowel obstruction.  Prior remote right nephrectomy and prostatectomy.   03/22/2018 Procedure   Thoracentesis  Diagnosis PLEURAL FLUID, RIGHT (SPECIMEN 1 OF 1, COLLECTED ON 03/22/2018): NO MALIGNANT CELLS IDENTIFIED.     04/22/2018 Cancer Staging   Staging form: Gastrointestinal Stromal Tumor - Gastric and Omental GIST, AJCC 8th Edition - Pathologic stage from 04/22/2018: Stage IIIB (pT4, pN0, cM0, Mitotic Rate: High) - Signed by Alla Feeling, NP on 04/22/2018   05/03/2018 Imaging   05/03/2018 CT Abdomen and  Pelvis W/ Contrast IMPRESSION: CTA chest:  1. No  pulmonary embolus. 2. Debris within the right lower lobe bronchus with associated right lower lobe opacity, this can be seen with aspiration. 3. Improved right upper lobe aeration from prior consistent with resolving pneumonia. Near completely resolved right pleural effusion. 4. Chronic bilateral lower lobe bronchiectasis. Bilateral lower lobe airspace disease with some improvement from most recent prior. 5. Coronary artery calcifications. Aortic Atherosclerosis (ICD10-I70.0).  CT abdomen/pelvis:  1. Large colonic stool burden, suggesting constipation. No bowel obstruction. 2. Post recent partial gastrectomy without evident complication. No intra-abdominal abscess. 3. Additional stable chronic findings as described. Aortic Atherosclerosis (ICD10-I70.0).   05/03/2018 Imaging   05/03/2018 CT Angio Chest PE W or WO Contrast IMPRESSION: CTA chest:  1. No pulmonary embolus. 2. Debris within the right lower lobe bronchus with associated right lower lobe opacity, this can be seen with aspiration. 3. Improved right upper lobe aeration from prior consistent with resolving pneumonia. Near completely resolved right pleural effusion. 4. Chronic bilateral lower lobe bronchiectasis. Bilateral lower lobe airspace disease with some improvement from most recent prior. 5. Coronary artery calcifications. Aortic Atherosclerosis (ICD10-I70.0).   05/03/2018 Imaging   05/03/2018 CT CAP IMPRESSION: CTA chest: 1. No pulmonary embolus. 2. Debris within the right lower lobe bronchus with associated right lower lobe opacity, this can be seen with aspiration. 3. Improved right upper lobe aeration from prior consistent with resolving pneumonia. Near completely resolved right pleural effusion. 4. Chronic bilateral lower lobe bronchiectasis. Bilateral lower lobe airspace disease with some improvement from most recent prior. 5. Coronary artery calcifications. Aortic Atherosclerosis (ICD10-I70.0).  CT  abdomen/pelvis: 1. Large colonic stool burden, suggesting constipation. No bowel obstruction. 2. Post recent partial gastrectomy without evident complication. No intra-abdominal abscess. 3. Additional stable chronic findings as described. Aortic Atherosclerosis (ICD10-I70.0).    05/26/2018 - 05/31/2020 Chemotherapy   Adjuvant Gleevec, starting on 05/26/18 at '200mg'$  daily and gradually increased to '400mg'$  daily since 06/23/2018. Complete end of June 2021   11/19/2018 Imaging   CT CAP  IMPRESSION: 1. Stable exam. No new or progressive interval findings. No features to suggest metastatic disease in the chest, abdomen, or pelvis. 2. Bronchiectasis/scarring in the lung bases, similar to prior. 3. Stable small left adrenal nodule. 4.  Aortic Atherosclerois (ICD10-170.0)   04/14/2019 Imaging   IMPRESSION: Status post partial gastrectomy. No evidence of recurrent or metastatic disease.   Status post cholecystectomy. Status post right nephrectomy. Status post prostatectomy with pelvic lymph node dissection.   Additional stable ancillary findings as above.   04/06/2020 Imaging   CT CAP W contrast  IMPRESSION: Chest Impression:   1. No evidence of thoracic metastasis. 2. Coronary artery calcification and aortic atherosclerotic calcification. 3. Stable bronchiectasis at the lung bases.   Abdomen / Pelvis Impression:   1. No evidence of gastrointestinal stromal tumor recurrence. Partial gastrectomy anatomy. 2. No metastatic disease in the abdomen pelvis. 3. No bowel obstruction. 4. Post RIGHT nephrectomy.      CURRENT THERAPY:  Surveillance   INTERVAL HISTORY:  Casey Gates is here for a follow up of GIST. He presents to the clinic with his son. He had fall but did not tell his son. He was having right leg pain and went to ED on 06/25/20. His son plans to change him to assisted living. He is going to f/u with his Afib Clinic with [facility] who plans to put him on Eliquis.  He notes off  [place] he has not noticed any difference. He will  have intermittent LLQ pain. He notes this pain flares bases on his position. His son notes he is not eating enough and he has lost 8 pounds over 6 weeks based on our scales. He notes having constipation on Oxymorphone for his back pain. He plans to take Movantik today when he gets home so he can have BM.     REVIEW OF SYSTEMS:   Constitutional: Denies fevers, chills (+) Low appetite, weight loss Eyes: Denies blurriness of vision Ears, nose, mouth, throat, and face: Denies mucositis or sore throat Respiratory: Denies cough, dyspnea or wheezes Cardiovascular: Denies palpitation, chest discomfort or lower extremity swelling Gastrointestinal:  Denies nausea, heartburn (+) constipation  (+) LLQ pain, intermittent  Skin: Denies abnormal skin rashes MSK: (+) Right knee pain  Lymphatics: Denies new lymphadenopathy or easy bruising Neurological:Denies numbness, tingling or new weaknesses Behavioral/Psych: Mood is stable, no new changes  All other systems were reviewed with the patient and are negative.  MEDICAL HISTORY:  Past Medical History:  Diagnosis Date  . Anemia   . Asthma    AS A CHILD  . Blind left eye   . Cancer [name])    PROSTATE CANCER  . Cellulitis    frequently, "all over"  . Chronic kidney disease   . Chronic pain syndrome   . Chronic ulcer of toe, right, with unspecified severity ([place])    2nd toe  . Constipation   . DDD (degenerative disc disease), lumbar   . Gastrointestinal stromal tumor (GIST) of stomach ([place])   . GI bleeding   . History of blood transfusion    01/2018  . Insomnia   . Kyphoscoliosis   . Mass in the abdomen 01/2018  . Restless leg   . Sleep apnea    does not wear CPAP  . Wears glasses     SURGICAL HISTORY: Past Surgical History:  Procedure Laterality Date  . AMPUTATION TOE Right 11/05/2018   Procedure: Right 2nd toe ampuation;  Surgeon: Wylene Simmer, MD;  Location: [facility];   Service: Orthopedics;  Laterality: Right;  28mn  . CHOLECYSTECTOMY    . COLONOSCOPY    . ESOPHAGOGASTRODUODENOSCOPY (EGD) WITH PROPOFOL Left 02/16/2018   Procedure: ESOPHAGOGASTRODUODENOSCOPY (EGD) WITH PROPOFOL;  Surgeon: KRonnette Juniper MD;  [place] [facility]  Service: Gastroenterology;  Laterality: Left;  . LAPAROSCOPIC GASTRECTOMY N/A 03/12/2018   Procedure: LAPAROSCOPIC ASSISTED PARTIAL GASTRECTOMY ERAS PATHWAY;  Surgeon: BStark Klein MD;  [place]  Service: General;  Laterality: N/A;  . LAPAROSCOPIC PARTIAL GASTRECTOMY  03/12/2018   LAPAROSCOPIC ASSISTED PARTIAL GASTRECTOMY ERAS PATHWAY  . PROSTATECTOMY    . QUADRICEPS TENDON REPAIR Left 07/07/2017   Procedure: REPAIR QUADRICEP TENDON;  Surgeon: SRod Can MD;  [place]  Service: Orthopedics;  Laterality: Left;  . RIGHT CATARACT EXTRACTION  2013  . RIGTH NEPHRECTOMY  2006  . TONSILLECTOMY    . TRANSURETHRAL RESECTION OF PROSTATE  1997    I have reviewed the social history and family history with the patient and they are unchanged from previous note.  ALLERGIES:  is allergic to coconut flavor [flavoring agent], coconut oil, doxycycline hyclate, and food.  MEDICATIONS:  Current Outpatient Medications  Medication Sig Dispense Refill  . apixaban (ELIQUIS) 2.5 MG TABS tablet Take 1 tablet (2.5 mg total) by mouth 2 (two) times daily. 60 tablet 3  . Carboxymethylcellulose Sodium (LUBRICANT EYE DROPS OP) Apply 1 drop to eye at bedtime.    .Marland Kitchendiltiazem (CARDIZEM SR) 120 MG 12 hr capsule  Take 1 capsule (120 mg total) by mouth 2 (two) times daily. 60 capsule 0  . gabapentin (NEURONTIN) 300 MG capsule Take 1 capsule (300 mg total) by mouth 3 (three) times daily. (Patient taking differently: Take 300 mg by mouth See admin instructions. Take 300 mg by mouth two to three times a day)    . mirtazapine (REMERON) 30 MG tablet Take 30 mg by mouth at bedtime.    Marland Kitchen MOVANTIK 25 MG TABS tablet Take 1 tablet by mouth daily.    .  mupirocin ointment (BACTROBAN) 2 %     . oxymorphone (OPANA) 10 MG tablet Take 10 mg by mouth every 6 (six) hours as needed for pain.     Vladimir Faster Glycol-Propyl Glycol (SYSTANE) 0.4-0.3 % GEL ophthalmic gel Place 1 application into both eyes 2 (two) times daily.     No current facility-administered medications for this visit.    PHYSICAL EXAMINATION: ECOG PERFORMANCE STATUS: 3 - Symptomatic, >50% confined to bed  There were no vitals filed for this visit. There were no vitals filed for this visit.  GENERAL:alert, no distress and comfortable SKIN: skin color, texture, turgor are normal, no rashes or significant lesions EYES: normal, Conjunctiva are pink and non-injected, sclera clear  NECK: supple, thyroid normal size, non-tender, without nodularity LYMPH:  no palpable lymphadenopathy in the cervical, axillary  LUNGS: clear to auscultation and percussion with normal breathing effort HEART: regular rate & rhythm and no murmurs and no lower extremity edema ABDOMEN:abdomen soft, non-tender and normal bowel sounds Musculoskeletal:no cyanosis of digits and no clubbing  NEURO: alert & oriented x 3 with fluent speech, no focal motor/sensory deficits EXAM DONE IN CHAIR TODAY   LABORATORY DATA:  I have reviewed the data as listed CBC Latest Ref Rng & Units 07/13/2020 06/26/2020 04/06/2020  WBC 4.0 - 10.5 K/uL 6.9 6.8 5.6  Hemoglobin 13.0 - 17.0 g/dL 12.1(L) 11.8(L) 11.8(L)  Hematocrit 39 - 52 % 38.5(L) 37.3(L) 36.9(L)  Platelets 150 - 400 K/uL 151 162 136(L)     CMP Latest Ref Rng & Units 07/13/2020 06/26/2020 04/06/2020  Glucose 70 - 99 mg/dL 58(L) 112(H) 112(H)  BUN 8 - 23 mg/dL 19 21 27(H)  Creatinine 0.61 - 1.24 mg/dL 1.05 0.97 0.96  Sodium 135 - 145 mmol/L 142 141 139  Potassium 3.5 - 5.1 mmol/L 4.2 4.5 4.6  Chloride 98 - 111 mmol/L 108 104 105  CO2 22 - 32 mmol/L '28 28 28  '$ Calcium 8.9 - 10.3 mg/dL 9.3 8.9 8.7(L)  Total Protein 6.5 - 8.1 g/dL 5.9(L) - 5.6(L)  Total Bilirubin 0.3 -  1.2 mg/dL 0.4 - 0.4  Alkaline Phos 38 - 126 U/L 69 - 58  AST 15 - 41 U/L 37 - 44(H)  ALT 0 - 44 U/L 21 - 22      RADIOGRAPHIC STUDIES: I have personally reviewed the radiological images as listed and agreed with the findings in the report. No results found.   ASSESSMENT & PLAN:  Casey Gates is a 86 y.o. male with    1. Gastrointestinal Stromal Tumor (GIST) of the lesser curvature of the stomach, 13.8 cm, pT4pNX, G2, mitotic rate >20/50 high power field, stage IIIB -He was diagnosed in 01/2018. He is s/p tumor resection.Pathology revealed a large 13.8 cm GIST in the stomach. IHC was performed and the tumor is positive with CD117 (C-Kit). Genomic testing showed Kit Exon 11 mutation/insertion, which predicts benefitfromGleevec. -He completed adjuvantGleevec for2years (instead of standard 3 years due to his  advanced age). It was held in Sep 2020 due to thrombocytopenia. He completed in the end of 05/2020.  -From a GIST standpoint he is clinically stable. Labs reviewed, CBC And CMP WNL except Hg 12.1, BG 58, protein 5.9, Ferritin 49.  -Proceed with surveillance. At his age I do not plan to repeat scan too often, will repeat one in May 2022 -F/u in 3 months    2.Hematochezia,resolved.No recurrence after returning to fulldoseGleevec.   3. Anemia, iron deficient -Hehas been treated with blood transfusionin the past -Currently onoral iron therapy 1 tab daily beginning in 04/2018, continue daily.Stableand mild. -Ferritin 49 today (07/13/20)  4. Chronic constipation -Related to chronicopioiduse withoxymorphonemanaged Dr. Nelva Bush -He does not tolerate Magnesium Citrate, Miralaxand senna, Lynzest andcolase does not work for him. -He uses enemas and OralMovantikwith mild relief. He still may not have BM for up to 3 days.   5.Low appetite, Depression -Hepreviouslystopped Mirtazapine due to lack of benefit. -He has stress and concerns about contracting COVID.He  plans to get Vaccine when it becomes available in his facility. I encouraged him to get it.  -He remains to have low appetite and weight continues to trend down. He did have Fall in 06/2020 due to weakness from low food intake and weight loss.  -BG at 58 today and protein 5.9 (07/13/20). He ate this morning, will give food in clinic today.  -I recommend he try Mirtazapine again. If not enough I will call in [facility]. I reviewed side effects with them. -His son plans to switch him to assisted living so he has more help.   6. S/p right 2nd toe amputation  7. Chronic Back pain -He has been on long term oxymorphone as needed for hischronicback pain, which is the main factor limiting his activities.He uses it TID now, no less than 5 hours a part. -Pain medication use does contribute to his constipation.   8. Atrial Fibrillation  -He was recently found to have Afib. He plans to see Afib Clinic this week and may be started on Eliquis. I discussed this can increase risk of bleeding and urged him to avoid fall or injury.    PLAN: -Lab and F/u in 3 months    No problem-specific Assessment & Plan notes found for this encounter.   No orders of the defined types were placed in this encounter.  All questions were answered. The patient knows to call the clinic with any problems, questions or concerns. No barriers to learning was detected. The total time spent in the appointment was 30 minutes.     Truitt Merle, MD 07/13/2020   I, Joslyn Devon, am acting as scribe for Truitt Merle, MD.   I have reviewed the above documentation for accuracy and completeness, and I agree with the above.

## 2020-07-13 ENCOUNTER — Encounter: Payer: Self-pay | Admitting: Hematology

## 2020-07-13 ENCOUNTER — Other Ambulatory Visit: Payer: Self-pay

## 2020-07-13 ENCOUNTER — Inpatient Hospital Stay: Payer: Medicare PPO | Attending: Hematology | Admitting: Hematology

## 2020-07-13 ENCOUNTER — Inpatient Hospital Stay: Payer: Medicare PPO

## 2020-07-13 ENCOUNTER — Telehealth: Payer: Self-pay | Admitting: Hematology

## 2020-07-13 DIAGNOSIS — C49A2 Gastrointestinal stromal tumor of stomach: Secondary | ICD-10-CM

## 2020-07-13 DIAGNOSIS — Z7901 Long term (current) use of anticoagulants: Secondary | ICD-10-CM | POA: Diagnosis not present

## 2020-07-13 DIAGNOSIS — K921 Melena: Secondary | ICD-10-CM | POA: Insufficient documentation

## 2020-07-13 DIAGNOSIS — I2510 Atherosclerotic heart disease of native coronary artery without angina pectoris: Secondary | ICD-10-CM | POA: Diagnosis not present

## 2020-07-13 DIAGNOSIS — M549 Dorsalgia, unspecified: Secondary | ICD-10-CM | POA: Insufficient documentation

## 2020-07-13 DIAGNOSIS — K5909 Other constipation: Secondary | ICD-10-CM | POA: Insufficient documentation

## 2020-07-13 DIAGNOSIS — F329 Major depressive disorder, single episode, unspecified: Secondary | ICD-10-CM | POA: Diagnosis not present

## 2020-07-13 DIAGNOSIS — I517 Cardiomegaly: Secondary | ICD-10-CM | POA: Diagnosis not present

## 2020-07-13 DIAGNOSIS — I4891 Unspecified atrial fibrillation: Secondary | ICD-10-CM | POA: Diagnosis not present

## 2020-07-13 DIAGNOSIS — N189 Chronic kidney disease, unspecified: Secondary | ICD-10-CM | POA: Insufficient documentation

## 2020-07-13 DIAGNOSIS — D696 Thrombocytopenia, unspecified: Secondary | ICD-10-CM | POA: Insufficient documentation

## 2020-07-13 DIAGNOSIS — D649 Anemia, unspecified: Secondary | ICD-10-CM | POA: Insufficient documentation

## 2020-07-13 DIAGNOSIS — G894 Chronic pain syndrome: Secondary | ICD-10-CM | POA: Insufficient documentation

## 2020-07-13 DIAGNOSIS — M5136 Other intervertebral disc degeneration, lumbar region: Secondary | ICD-10-CM | POA: Insufficient documentation

## 2020-07-13 DIAGNOSIS — G4730 Sleep apnea, unspecified: Secondary | ICD-10-CM | POA: Diagnosis not present

## 2020-07-13 DIAGNOSIS — K449 Diaphragmatic hernia without obstruction or gangrene: Secondary | ICD-10-CM | POA: Insufficient documentation

## 2020-07-13 DIAGNOSIS — D509 Iron deficiency anemia, unspecified: Secondary | ICD-10-CM | POA: Insufficient documentation

## 2020-07-13 DIAGNOSIS — I700 Atherosclerosis of aorta: Secondary | ICD-10-CM | POA: Insufficient documentation

## 2020-07-13 DIAGNOSIS — Z79899 Other long term (current) drug therapy: Secondary | ICD-10-CM | POA: Diagnosis not present

## 2020-07-13 DIAGNOSIS — Z85028 Personal history of other malignant neoplasm of stomach: Secondary | ICD-10-CM | POA: Insufficient documentation

## 2020-07-13 LAB — CBC WITH DIFFERENTIAL (CANCER CENTER ONLY)
Abs Immature Granulocytes: 0.04 10*3/uL (ref 0.00–0.07)
Basophils Absolute: 0.1 10*3/uL (ref 0.0–0.1)
Basophils Relative: 1 %
Eosinophils Absolute: 0.6 10*3/uL — ABNORMAL HIGH (ref 0.0–0.5)
Eosinophils Relative: 9 %
HCT: 38.5 % — ABNORMAL LOW (ref 39.0–52.0)
Hemoglobin: 12.1 g/dL — ABNORMAL LOW (ref 13.0–17.0)
Immature Granulocytes: 1 %
Lymphocytes Relative: 30 %
Lymphs Abs: 2.1 10*3/uL (ref 0.7–4.0)
MCH: 31.3 pg (ref 26.0–34.0)
MCHC: 31.4 g/dL (ref 30.0–36.0)
MCV: 99.5 fL (ref 80.0–100.0)
Monocytes Absolute: 0.9 10*3/uL (ref 0.1–1.0)
Monocytes Relative: 13 %
Neutro Abs: 3.2 10*3/uL (ref 1.7–7.7)
Neutrophils Relative %: 46 %
Platelet Count: 151 10*3/uL (ref 150–400)
RBC: 3.87 MIL/uL — ABNORMAL LOW (ref 4.22–5.81)
RDW: 12.3 % (ref 11.5–15.5)
WBC Count: 6.9 10*3/uL (ref 4.0–10.5)
nRBC: 0 % (ref 0.0–0.2)

## 2020-07-13 LAB — CMP (CANCER CENTER ONLY)
ALT: 21 U/L (ref 0–44)
AST: 37 U/L (ref 15–41)
Albumin: 3.5 g/dL (ref 3.5–5.0)
Alkaline Phosphatase: 69 U/L (ref 38–126)
Anion gap: 6 (ref 5–15)
BUN: 19 mg/dL (ref 8–23)
CO2: 28 mmol/L (ref 22–32)
Calcium: 9.3 mg/dL (ref 8.9–10.3)
Chloride: 108 mmol/L (ref 98–111)
Creatinine: 1.05 mg/dL (ref 0.61–1.24)
GFR, Est AFR Am: >60 mL/min (ref >60)
GFR, Estimated: >60 mL/min (ref >60)
Glucose, Bld: 58 mg/dL — ABNORMAL LOW (ref 70–99)
Potassium: 4.2 mmol/L (ref 3.5–5.1)
Sodium: 142 mmol/L (ref 135–145)
Total Bilirubin: 0.4 mg/dL (ref 0.3–1.2)
Total Protein: 5.9 g/dL — ABNORMAL LOW (ref 6.5–8.1)

## 2020-07-13 LAB — FERRITIN: Ferritin: 49 ng/mL (ref 24–336)

## 2020-07-13 NOTE — Telephone Encounter (Signed)
Scheduled appointments per 8/12 los. Gave patient updated calendar at time of service.

## 2020-07-14 ENCOUNTER — Ambulatory Visit (HOSPITAL_COMMUNITY)
Admission: RE | Admit: 2020-07-14 | Discharge: 2020-07-14 | Disposition: A | Discharge status: Home / Self Care | Payer: Medicare PPO | Source: Ambulatory Visit | Attending: Nurse Practitioner | Admitting: Nurse Practitioner

## 2020-07-14 ENCOUNTER — Encounter (HOSPITAL_COMMUNITY): Payer: Self-pay | Admitting: Nurse Practitioner

## 2020-07-14 VITALS — BP 92/60 | HR 103 | Ht 71.0 in | Wt 129.0 lb

## 2020-07-14 DIAGNOSIS — I4891 Unspecified atrial fibrillation: Secondary | ICD-10-CM | POA: Diagnosis not present

## 2020-07-14 DIAGNOSIS — C49A2 Gastrointestinal stromal tumor of stomach: Secondary | ICD-10-CM | POA: Diagnosis not present

## 2020-07-14 DIAGNOSIS — D649 Anemia, unspecified: Secondary | ICD-10-CM | POA: Insufficient documentation

## 2020-07-14 DIAGNOSIS — Z881 Allergy status to other antibiotic agents status: Secondary | ICD-10-CM | POA: Diagnosis not present

## 2020-07-14 DIAGNOSIS — Z89421 Acquired absence of other right toe(s): Secondary | ICD-10-CM | POA: Diagnosis not present

## 2020-07-14 DIAGNOSIS — N189 Chronic kidney disease, unspecified: Secondary | ICD-10-CM | POA: Insufficient documentation

## 2020-07-14 DIAGNOSIS — D6869 Other thrombophilia: Secondary | ICD-10-CM

## 2020-07-14 DIAGNOSIS — Z905 Acquired absence of kidney: Secondary | ICD-10-CM | POA: Insufficient documentation

## 2020-07-14 DIAGNOSIS — Z9049 Acquired absence of other specified parts of digestive tract: Secondary | ICD-10-CM | POA: Diagnosis not present

## 2020-07-14 DIAGNOSIS — W19XXXA Unspecified fall, initial encounter: Secondary | ICD-10-CM | POA: Insufficient documentation

## 2020-07-14 DIAGNOSIS — G894 Chronic pain syndrome: Secondary | ICD-10-CM | POA: Insufficient documentation

## 2020-07-14 DIAGNOSIS — M5136 Other intervertebral disc degeneration, lumbar region: Secondary | ICD-10-CM | POA: Diagnosis not present

## 2020-07-14 DIAGNOSIS — Z8546 Personal history of malignant neoplasm of prostate: Secondary | ICD-10-CM | POA: Insufficient documentation

## 2020-07-14 DIAGNOSIS — Z79899 Other long term (current) drug therapy: Secondary | ICD-10-CM | POA: Insufficient documentation

## 2020-07-14 DIAGNOSIS — Z7901 Long term (current) use of anticoagulants: Secondary | ICD-10-CM | POA: Insufficient documentation

## 2020-07-14 DIAGNOSIS — Z9079 Acquired absence of other genital organ(s): Secondary | ICD-10-CM | POA: Insufficient documentation

## 2020-07-14 DIAGNOSIS — S30810A Abrasion of lower back and pelvis, initial encounter: Secondary | ICD-10-CM | POA: Diagnosis not present

## 2020-07-14 MED ORDER — DILTIAZEM HCL ER 120 MG PO CP12: 120.0000 mg | ORAL_CAPSULE | Freq: Two times a day (BID) | ORAL | 5 refills | Status: DC

## 2020-07-14 NOTE — Progress Notes (Signed)
Primary Care Physician: Velna Hatchet, MD Referring Physician: [facility] ER  F/U    Casey Gates is a 86 y.o. male with  that lives independently at [facility] and was evaluated in the ER for a mechanical  Fall 06/25/20 . He was arranging things in his closet and fell backward landing on his buttock and striking his rt knee. He had to scoot on his bottom to get to  a chair. This resulted in an abrasion to his buttock.This process took around 1.5 hours. His EKG showed afib with RVR, which is a new finding.Pt was asymptomatic.  ER physician conferred with cardiology. They did not recommend any further testing. Suggested rate control. He was not determined to be a good candidate for anticoagulation due to advanced age and falls, previous GI bleed.   In the office today, pt is with his son who is primarily the historian for the pt. He states that he had a GI bleed in the past due to a tumor and once he had surgery to remove this he has not had any further bleeding issues. He has had this one recent  fall but no other falls that he is aware. Ekg  shows today that he is rate controlled. He is not on daily asa. He has had both covid shots.   F/u in afib clinic, 8.13. Pt is doing well on anticoagulation per son. No further falls.  Pt denies any blood seen in urine or stool. He had labs yesterday at the Cancer center and CBC was actually improved form last CBC. His HR is the cliinc in at 100 bpm, which is acceptable with pt's advanced age and sedentary. I can't increase rate control as pt has soft BP. Son will monitor HR /BP at home and call in readings in about one week.   Today, he denies symptoms of palpitations, chest pain, shortness of breath, orthopnea, PND, lower extremity edema, dizziness, presyncope, syncope, or neurologic sequela. The patient is tolerating medications without difficulties and is otherwise without complaint today.   Past Medical History:  Diagnosis Date  . Anemia   . Asthma    AS  A CHILD  . Blind left eye   . Cancer [facility])    PROSTATE CANCER  . Cellulitis    frequently, "all over"  . Chronic kidney disease   . Chronic pain syndrome   . Chronic ulcer of toe, right, with unspecified severity ([place])    2nd toe  . Constipation   . DDD (degenerative disc disease), lumbar   . Gastrointestinal stromal tumor (GIST) of stomach ([place])   . GI bleeding   . History of blood transfusion    01/2018  . Insomnia   . Kyphoscoliosis   . Mass in the abdomen 01/2018  . Restless leg   . Sleep apnea    does not wear CPAP  . Wears glasses    Past Surgical History:  Procedure Laterality Date  . AMPUTATION TOE Right 11/05/2018   Procedure: Right 2nd toe ampuation;  Surgeon: Wylene Simmer, MD;  Location: [place];  Service: Orthopedics;  Laterality: Right;  32min  . CHOLECYSTECTOMY    . COLONOSCOPY    . ESOPHAGOGASTRODUODENOSCOPY (EGD) WITH PROPOFOL Left 02/16/2018   Procedure: ESOPHAGOGASTRODUODENOSCOPY (EGD) WITH PROPOFOL;  Surgeon: [name], MD;  Location: [place];  Service: Gastroenterology;  Laterality: Left;  . LAPAROSCOPIC GASTRECTOMY N/A 03/12/2018   Procedure: LAPAROSCOPIC ASSISTED PARTIAL GASTRECTOMY ERAS PATHWAY;  Surgeon: Stark Klein, MD;  Location: [facility]  OR;  Service: General;  Laterality: N/A;  . LAPAROSCOPIC PARTIAL GASTRECTOMY  03/12/2018   LAPAROSCOPIC ASSISTED PARTIAL GASTRECTOMY ERAS PATHWAY  . PROSTATECTOMY    . QUADRICEPS TENDON REPAIR Left 07/07/2017   Procedure: REPAIR QUADRICEP TENDON;  Surgeon: Rod Can, MD;  Location: [place];  Service: Orthopedics;  Laterality: Left;  . RIGHT CATARACT EXTRACTION  2013  . RIGTH NEPHRECTOMY  2006  . TONSILLECTOMY    . TRANSURETHRAL RESECTION OF PROSTATE  1997    Current Outpatient Medications  Medication Sig Dispense Refill  . apixaban (ELIQUIS) 2.5 MG TABS tablet Take 1 tablet (2.5 mg total) by mouth 2 (two) times daily. 60 tablet 3  . Carboxymethylcellulose Sodium (LUBRICANT EYE DROPS OP)  Apply 1 drop to eye at bedtime.    Marland Kitchen diltiazem (CARDIZEM SR) 120 MG 12 hr capsule Take 1 capsule (120 mg total) by mouth 2 (two) times daily. 60 capsule 5  . gabapentin (NEURONTIN) 300 MG capsule Take 1 capsule (300 mg total) by mouth 3 (three) times daily. (Patient taking differently: Take 300 mg by mouth See admin instructions. Take 300 mg by mouth two to three times a day)    . mirtazapine (REMERON) 30 MG tablet Take 30 mg by mouth at bedtime.    Marland Kitchen MOVANTIK 25 MG TABS tablet Take 1 tablet by mouth daily.    . mupirocin ointment (BACTROBAN) 2 %     . oxymorphone (OPANA) 10 MG tablet Take 10 mg by mouth every 6 (six) hours as needed for pain.     Vladimir Faster Glycol-Propyl Glycol (SYSTANE) 0.4-0.3 % GEL ophthalmic gel Place 1 application into both eyes 2 (two) times daily.     No current facility-administered medications for this encounter.    Allergies  Allergen Reactions  . Coconut Flavor [Flavoring Agent] Hypertension    Anything that is related to coconut---LOSS OF CONSCIOUSNESS ([place])  . Coconut Oil Other (See Comments)    Anything that is related to coconut--- LOSS OF CONSCIOUSNESS (SYNCOPE)  . Doxycycline Hyclate Hives  . Food Anaphylaxis    ALLERGIC TO ALL NUTS WITH THE EXCEPTION OF CASHEWS, ALMONDS    Social History   Socioeconomic History  . Marital status: Widowed    Spouse name: Not on file  . Number of children: 2  . Years of education: Not on file  . Highest education level: Not on file  Occupational History  . Not on file  Tobacco Use  . Smoking status: Never Smoker  . Smokeless tobacco: Never Used  Vaping Use  . Vaping Use: Never used  Substance and Sexual Activity  . Alcohol use: No    Alcohol/week: 0.0 standard drinks    Comment: quit 1985, 2 fifths/ week,S/P rehab  . Drug use: No  . Sexual activity: Never  Other Topics Concern  . Not on file  Social History Narrative   Caffeine 2-3 cups average.  Widowed,  Lives at [facility] [place]. Living.   Retired Forensic psychologist.  One son.   Social Determinants of Health   Financial Resource Strain:   . Difficulty of Paying Living Expenses:   Food Insecurity:   . Worried About Charity fundraiser in the Last Year:   . Arboriculturist in the Last Year:   Transportation Needs:   . Film/video editor (Medical):   Marland Kitchen Lack of Transportation (Non-Medical):   Physical Activity:   . Days of Exercise per Week:   . Minutes of Exercise per Session:  Stress:   . Feeling of Stress :   Social Connections:   . Frequency of Communication with Friends and Family:   . Frequency of Social Gatherings with Friends and Family:   . Attends Religious Services:   . Active Member of Clubs or Organizations:   . Attends Archivist Meetings:   Marland Kitchen Marital Status:   Intimate Partner Violence:   . Fear of Current or Ex-Partner:   . Emotionally Abused:   Marland Kitchen Physically Abused:   . Sexually Abused:     Family History  Problem Relation Age of Onset  . Bone cancer Mother   . Hypertension Mother   . Arthritis Mother   . Clotting disorder Daughter        died of blood clot     ROS- All systems are reviewed and negative except as per the HPI above  Physical Exam: Vitals:   07/14/20 1026  BP: 92/60  Pulse: (!) 103  Weight: 58.5 kg  Height: 5\' 11"  (1.803 m)   Wt Readings from Last 3 Encounters:  07/14/20 58.5 kg  06/28/20 59.2 kg  04/12/20 63.8 kg    Labs: Lab Results  Component Value Date   NA 142 07/13/2020   K 4.2 07/13/2020   CL 108 07/13/2020   CO2 28 07/13/2020   GLUCOSE 58 (L) 07/13/2020   BUN 19 07/13/2020   CREATININE 1.05 07/13/2020   CALCIUM 9.3 07/13/2020   PHOS 2.9 12/31/2017   MG 2.2 06/26/2020   Lab Results  Component Value Date   INR 1.43 03/18/2018   No results found for: CHOL, HDL, LDLCALC, TRIG   GEN- The patient is well appearing, alert and oriented x 3 today.   Head- normocephalic, atraumatic Eyes-  Sclera clear, conjunctiva pink Ears- hearing  intact Oropharynx- clear Neck- supple, no JVP Lymph- no cervical lymphadenopathy Lungs- Clear to ausculation bilaterally, normal work of breathing Heart- irregular rate and rhythm, no murmurs, rubs or gallops, PMI not laterally displaced GI- soft, NT, ND, + BS Extremities- no clubbing, cyanosis, or edema MS- no significant deformity or atrophy Skin- no rash or lesion Psych- euthymic mood, full affect Neuro- strength and sensation are intact  EKG-afib at 103 bpm, qrs int 140 ms, qtc 484 ms  ER records reviewed   Assessment and Plan: 1. New onset afib  General  education re afib  Pt seems to be asymptomatic  He is reasonably  rate controlled  Continue  cardizem 120 mg bid  Son will check BP/HR over the next week and call in readings   2. CHA2DS2VASc score of 2 (age)  Continue  eliquis 2.5 mg bid   F/u will depend on home readings ( most likely see him back in one month)   Butch Penny C. Wendy Hoback, [facility] [address] [place], [place] [address] [phone]

## 2020-07-19 DIAGNOSIS — Z1159 Encounter for screening for other viral diseases: Secondary | ICD-10-CM | POA: Diagnosis not present

## 2020-07-19 DIAGNOSIS — Z20828 Contact with and (suspected) exposure to other viral communicable diseases: Secondary | ICD-10-CM | POA: Diagnosis not present

## 2020-07-24 ENCOUNTER — Other Ambulatory Visit (HOSPITAL_COMMUNITY): Payer: Self-pay | Admitting: *Deleted

## 2020-07-24 ENCOUNTER — Telehealth (HOSPITAL_COMMUNITY): Payer: Self-pay | Admitting: *Deleted

## 2020-07-24 MED ORDER — DILTIAZEM HCL ER 120 MG PO CP12: 120.0000 mg | ORAL_CAPSULE | Freq: Two times a day (BID) | ORAL | 5 refills | Status: DC

## 2020-07-24 MED ORDER — APIXABAN 2.5 MG PO TABS: 2.5000 mg | ORAL_TABLET | Freq: Two times a day (BID) | ORAL | 3 refills | Status: AC

## 2020-07-24 NOTE — Telephone Encounter (Signed)
BP readings  8/14 - 90/50 HR 105 8/15 - 105/66 HR 87 8/16 - 111/59 HR 87 8/17 - 112/60 HR 87 8/18 - 111/59 HR 82 8/19 - 89/45 HR 98 8/20 - 87/64 HR 97  Per Roderic Palau NP HRs improved see in 1 month. Son notified.

## 2020-07-26 DIAGNOSIS — Z1159 Encounter for screening for other viral diseases: Secondary | ICD-10-CM | POA: Diagnosis not present

## 2020-07-26 DIAGNOSIS — Z20828 Contact with and (suspected) exposure to other viral communicable diseases: Secondary | ICD-10-CM | POA: Diagnosis not present

## 2020-07-27 ENCOUNTER — Emergency Department (HOSPITAL_COMMUNITY)
Admission: EM | Admit: 2020-07-27 | Discharge: 2020-07-27 | Disposition: A | Discharge status: Home / Self Care | Payer: Medicare PPO | Attending: Emergency Medicine | Admitting: Emergency Medicine

## 2020-07-27 ENCOUNTER — Encounter (HOSPITAL_COMMUNITY): Payer: Self-pay

## 2020-07-27 ENCOUNTER — Other Ambulatory Visit: Payer: Self-pay

## 2020-07-27 DIAGNOSIS — Z20822 Contact with and (suspected) exposure to covid-19: Secondary | ICD-10-CM | POA: Diagnosis not present

## 2020-07-27 DIAGNOSIS — R197 Diarrhea, unspecified: Secondary | ICD-10-CM

## 2020-07-27 DIAGNOSIS — J45909 Unspecified asthma, uncomplicated: Secondary | ICD-10-CM | POA: Diagnosis not present

## 2020-07-27 DIAGNOSIS — Z8546 Personal history of malignant neoplasm of prostate: Secondary | ICD-10-CM | POA: Diagnosis not present

## 2020-07-27 DIAGNOSIS — Z79899 Other long term (current) drug therapy: Secondary | ICD-10-CM | POA: Diagnosis not present

## 2020-07-27 LAB — COMPREHENSIVE METABOLIC PANEL
ALT: 26 U/L (ref 0–44)
AST: 37 U/L (ref 15–41)
Albumin: 4.1 g/dL (ref 3.5–5.0)
Alkaline Phosphatase: 69 U/L (ref 38–126)
Anion gap: 10 (ref 5–15)
BUN: 22 mg/dL (ref 8–23)
CO2: 27 mmol/L (ref 22–32)
Calcium: 9.2 mg/dL (ref 8.9–10.3)
Chloride: 102 mmol/L (ref 98–111)
Creatinine, Ser: 1.08 mg/dL (ref 0.61–1.24)
GFR calc Af Amer: >60 mL/min (ref >60)
GFR calc non Af Amer: 60 mL/min — ABNORMAL LOW (ref >60)
Glucose, Bld: 102 mg/dL — ABNORMAL HIGH (ref 70–99)
Potassium: 4.4 mmol/L (ref 3.5–5.1)
Sodium: 139 mmol/L (ref 135–145)
Total Bilirubin: 0.6 mg/dL (ref 0.3–1.2)
Total Protein: 6.7 g/dL (ref 6.5–8.1)

## 2020-07-27 LAB — CBC
HCT: 42.2 % (ref 39.0–52.0)
Hemoglobin: 13.3 g/dL (ref 13.0–17.0)
MCH: 30.7 pg (ref 26.0–34.0)
MCHC: 31.5 g/dL (ref 30.0–36.0)
MCV: 97.5 fL (ref 80.0–100.0)
Platelets: 167 10*3/uL (ref 150–400)
RBC: 4.33 MIL/uL (ref 4.22–5.81)
RDW: 12.6 % (ref 11.5–15.5)
WBC: 8.7 10*3/uL (ref 4.0–10.5)
nRBC: 0 % (ref 0.0–0.2)

## 2020-07-27 LAB — URINALYSIS, ROUTINE W REFLEX MICROSCOPIC
Bilirubin Urine: NEGATIVE
Glucose, UA: NEGATIVE mg/dL
Hgb urine dipstick: NEGATIVE
Ketones, ur: NEGATIVE mg/dL
Leukocytes,Ua: NEGATIVE
Nitrite: NEGATIVE
Protein, ur: NEGATIVE mg/dL
Specific Gravity, Urine: 1.018 (ref 1.005–1.030)
pH: 5 (ref 5.0–8.0)

## 2020-07-27 LAB — SARS CORONAVIRUS 2 BY RT PCR (HOSPITAL ORDER, PERFORMED IN ~~LOC~~ HOSPITAL LAB): SARS Coronavirus 2: NEGATIVE

## 2020-07-27 LAB — LIPASE, BLOOD: Lipase: 21 U/L (ref 11–51)

## 2020-07-27 MED ORDER — SODIUM CHLORIDE 0.9 % IV BOLUS
1000.0000 mL | Freq: Once | INTRAVENOUS | Status: AC
  Administered 2020-07-27: 1000 mL via INTRAVENOUS

## 2020-07-27 NOTE — ED Notes (Signed)
Son called to get patient

## 2020-07-27 NOTE — ED Triage Notes (Signed)
Pt presents with c/o diarrhea for 5 days. Pt reports that he did call his doctor about this but reports that his son talked to his doctor so he is unsure of what the doctor said.

## 2020-07-27 NOTE — ED Provider Notes (Signed)
[facility] DEPT Provider Note   CSN: [id number] Arrival date & time: 07/27/20  8099     History Chief Complaint  Patient presents with  . Diarrhea    Casey Gates is a 86 y.o. male.  The history is provided by the patient and medical records. No language interpreter was used.  Diarrhea Quality:  Watery Severity:  Moderate Onset quality:  Gradual Number of episodes:  Numerous Duration:  5 days Timing:  Constant Progression:  Unchanged Relieved by:  Nothing Worsened by:  Nothing Ineffective treatments:  Anti-motility medications Associated symptoms: no abdominal pain, no arthralgias, no chills, no recent cough, no diaphoresis, no fever, no headaches, no myalgias, no URI and no vomiting   Risk factors: sick contacts        Past Medical History:  Diagnosis Date  . Anemia   . Asthma    AS A CHILD  . Blind left eye   . Cancer [facility])    PROSTATE CANCER  . Cellulitis    frequently, "all over"  . Chronic kidney disease   . Chronic pain syndrome   . Chronic ulcer of toe, right, with unspecified severity ([place])    2nd toe  . Constipation   . DDD (degenerative disc disease), lumbar   . Gastrointestinal stromal tumor (GIST) of stomach ([place])   . GI bleeding   . History of blood transfusion    01/2018  . Insomnia   . Kyphoscoliosis   . Mass in the abdomen 01/2018  . Restless leg   . Sleep apnea    does not wear CPAP  . Wears glasses     Patient Active Problem List   Diagnosis Date Noted  . Back pain, chronic 12/14/2018  . Edema 12/14/2018  . Prostate cancer ([place]) 12/14/2018  . History of amputation of lesser toe ([place]) 11/16/2018  . Chronic ulcer of right foot with fat layer exposed ([place]) 11/05/2018  . Contusion of right hip region 07/24/2018  . Multifocal pneumonia 03/18/2018  . Sepsis ([place]) 03/18/2018  . Malignant gastrointestinal stromal tumor (GIST) of stomach ([place]) 03/12/2018  . Urinary retention 02/24/2018  .  Gastrointestinal stromal tumor (GIST) of stomach ([place])   . Restless leg syndrome   . Acute GI bleeding 02/16/2018  . Acute blood loss anemia 02/16/2018  . Chronic low back pain 02/05/2018  . Degeneration of lumbar intervertebral disc 02/05/2018  . Constipation 01/20/2018  . Exertional shortness of breath 12/30/2017  . Rupture of left quadriceps muscle 07/07/2017  . Rupture of left quadriceps tendon 06/26/2017  . Eczema 06/26/2017  . Chest pain 07/29/2015  . OSA on CPAP 07/10/2015  . CPAP use counseling 07/10/2015  . Excessive daytime sleepiness 02/22/2015  . Sleep apnea 02/22/2015  . Snoring 02/22/2015  . Nocturia more than twice per night 02/22/2015  . Syncope 12/31/2012  . Bifascicular bundle branch block 12/31/2012    Class: Acute  . Cellulitis and abscess of leg, except foot 12/04/2012  . Chronic pain 12/04/2012  . Kyphosis 12/04/2012  . H/O unilateral nephrectomy 12/04/2012  . H/O prostate cancer 12/04/2012  . S/P TURP 12/04/2012    Past Surgical History:  Procedure Laterality Date  . AMPUTATION TOE Right 11/05/2018   Procedure: Right 2nd toe ampuation;  Surgeon: Wylene Simmer, MD;  Location: [place];  Service: Orthopedics;  Laterality: Right;  33min  . CHOLECYSTECTOMY    . COLONOSCOPY    . ESOPHAGOGASTRODUODENOSCOPY (EGD) WITH PROPOFOL Left 02/16/2018   Procedure: ESOPHAGOGASTRODUODENOSCOPY (EGD) WITH  PROPOFOL;  Surgeon: [name], MD;  Location: [place];  Service: Gastroenterology;  Laterality: Left;  . LAPAROSCOPIC GASTRECTOMY N/A 03/12/2018   Procedure: LAPAROSCOPIC ASSISTED PARTIAL GASTRECTOMY ERAS PATHWAY;  Surgeon: Stark Klein, MD;  Location: [place];  Service: General;  Laterality: N/A;  . LAPAROSCOPIC PARTIAL GASTRECTOMY  03/12/2018   LAPAROSCOPIC ASSISTED PARTIAL GASTRECTOMY ERAS PATHWAY  . PROSTATECTOMY    . QUADRICEPS TENDON REPAIR Left 07/07/2017   Procedure: REPAIR QUADRICEP TENDON;  Surgeon: Rod Can, MD;  Location: [place];   Service: Orthopedics;  Laterality: Left;  . RIGHT CATARACT EXTRACTION  2013  . RIGTH NEPHRECTOMY  2006  . TONSILLECTOMY    . TRANSURETHRAL RESECTION OF PROSTATE  1997       Family History  Problem Relation Age of Onset  . Bone cancer Mother   . Hypertension Mother   . Arthritis Mother   . Clotting disorder Daughter        died of blood clot     Social History   Tobacco Use  . Smoking status: Never Smoker  . Smokeless tobacco: Never Used  Vaping Use  . Vaping Use: Never used  Substance Use Topics  . Alcohol use: No    Alcohol/week: 0.0 standard drinks    Comment: quit 1985, 2 fifths/ week,S/P rehab  . Drug use: No    Home Medications Prior to Admission medications   Medication Sig Start Date End Date Taking? Authorizing Provider  apixaban (ELIQUIS) 2.5 MG TABS tablet Take 1 tablet (2.5 mg total) by mouth 2 (two) times daily. 07/24/20   Sherran Needs, NP  Carboxymethylcellulose Sodium (LUBRICANT EYE DROPS OP) Apply 1 drop to eye at bedtime.    [provider]  diltiazem (CARDIZEM SR) 120 MG 12 hr capsule Take 1 capsule (120 mg total) by mouth 2 (two) times daily. 07/24/20 08/23/20  Sherran Needs, NP  gabapentin (NEURONTIN) 300 MG capsule Take 1 capsule (300 mg total) by mouth 3 (three) times daily. Patient taking differently: Take 300 mg by mouth See admin instructions. Take 300 mg by mouth two to three times a day 03/24/18   Katherine Roan, MD  mirtazapine (REMERON) 30 MG tablet Take 30 mg by mouth at bedtime. 05/31/20   [provider]  MOVANTIK 25 MG TABS tablet Take 1 tablet by mouth daily. 09/10/19   [provider]  mupirocin ointment (BACTROBAN) 2 %  02/17/20   [provider]  oxymorphone (OPANA) 10 MG tablet Take 10 mg by mouth every 6 (six) hours as needed for pain.  07/03/18   [provider]  Polyethyl Glycol-Propyl Glycol (SYSTANE) 0.4-0.3 % GEL ophthalmic gel Place 1 application into both eyes 2 (two) times daily.     [provider]    Allergies    Coconut flavor [flavoring agent], Coconut oil, Doxycycline hyclate, and Food  Review of Systems   Review of Systems  Constitutional: Negative for chills, diaphoresis, fatigue and fever.  HENT: Negative for congestion.   Eyes: Negative for visual disturbance.  Respiratory: Negative for cough, chest tightness, shortness of breath and wheezing.   Cardiovascular: Negative for chest pain, palpitations and leg swelling.  Gastrointestinal: Positive for diarrhea. Negative for abdominal pain, constipation, nausea and vomiting.  Genitourinary: Negative for dysuria, flank pain and frequency.       Hesitancy  Musculoskeletal: Negative for arthralgias, back pain, myalgias and neck stiffness.  Skin: Negative for wound.  Neurological: Negative for dizziness, syncope, weakness, light-headedness and headaches.  Psychiatric/Behavioral:  Negative for agitation and confusion.  All other systems reviewed and are negative.   Physical Exam Updated Vital Signs BP 113/72 (BP Location: Left Arm)   Pulse (!) 57   Temp 97.7 F (36.5 C) (Oral)   Resp 18   SpO2 99%   Physical Exam Vitals and nursing note reviewed.  Constitutional:      General: He is not in acute distress.    Appearance: He is well-developed. He is not ill-appearing, toxic-appearing or diaphoretic.  HENT:     Head: Normocephalic and atraumatic.     Nose: No congestion or rhinorrhea.     Mouth/Throat:     Mouth: Mucous membranes are moist.     Pharynx: No oropharyngeal exudate or posterior oropharyngeal erythema.  Eyes:     Extraocular Movements: Extraocular movements intact.     Conjunctiva/sclera: Conjunctivae normal.     Pupils: Pupils are equal, round, and reactive to light.  Cardiovascular:     Rate and Rhythm: Normal rate and regular rhythm.     Heart sounds: No murmur heard.   Pulmonary:     Effort: Pulmonary effort is normal. No respiratory distress.     Breath sounds: Normal  breath sounds. No wheezing, rhonchi or rales.  Chest:     Chest wall: No tenderness.  Abdominal:     General: Abdomen is flat.     Palpations: Abdomen is soft.     Tenderness: There is no abdominal tenderness. There is no right CVA tenderness, left CVA tenderness, guarding or rebound.  Musculoskeletal:        General: No tenderness.     Cervical back: Neck supple. No tenderness.     Right lower leg: No edema.     Left lower leg: No edema.  Skin:    General: Skin is warm and dry.     Capillary Refill: Capillary refill takes less than 2 seconds.     Findings: No erythema.  Neurological:     General: No focal deficit present.     Mental Status: He is alert.     Sensory: No sensory deficit.     Motor: No weakness.  Psychiatric:        Mood and Affect: Mood normal.     ED Results / Procedures / Treatments   Labs (all labs ordered are listed, but only abnormal results are displayed) Labs Reviewed  COMPREHENSIVE METABOLIC PANEL - Abnormal; Notable for the following components:      Result Value   Glucose, Bld 102 (*)    GFR calc non Af Amer 60 (*)    All other components within normal limits  SARS CORONAVIRUS 2 BY RT PCR (HOSPITAL ORDER, [facility])  URINE CULTURE  LIPASE, BLOOD  CBC  URINALYSIS, ROUTINE W REFLEX MICROSCOPIC    EKG None  Radiology No results found.  Procedures Procedures (including critical care time)  Medications Ordered in ED Medications  sodium chloride 0.9 % bolus 1,000 mL (0 mLs Intravenous Stopped 07/27/20 1659)    ED Course  I have reviewed the triage vital signs and the nursing notes.  Pertinent labs & imaging results that were available during my care of the patient were reviewed by me and considered in my medical decision making (see chart for details).    MDM Rules/Calculators/A&P                          Nijel Flink is a 86  y.o. male with a past medical history significant for prior prostate cancer,  unilateral nephrectomy, CKD, prior GI bleeding, atrial fibrillation on Eliquis, and asthma who presents with diarrhea.  Patient reports that he is chronically on opioids for chronic pains and several days ago was having constipation.  He reports taking a Movantik to help move his bowels and since then has had diarrhea constantly.  He reports that his diarrhea has been numerous but has not any bleeding.  He denies recent antibiotics and has no history of C. difficile or exposure to this.  He denies any fevers or chills, congestion, or cough.  Denies any chest pain, palpitations, lightheadedness, shortness of breath, or syncope.  When he told his PCP has had diarrhea for the last 5 days, the PCP told him to come get evaluated to make sure he does not have any AKI or electrolyte imbalance or significant dehydration.    Son reports that he tried to use Imodium on the patient and did not seem to help yesterday.  Patient is coming by son who reports that he had some diarrhea the other day and thinks he may have given his father a viral diarrhea illness.  Patient otherwise denies any symptoms and is resting comfortably.  On monitor evaluation, vital signs are reassuring.  Is not hypotensive or tachycardic.  Patient's lungs are clear and chest is nontender.  Abdomen is nontender with normal bowel sounds.  No evidence of trauma seen patient resting comfortably.  Patient had laboratory screening work-up including urinalysis given his urinary symptoms of some hesitancy.  Urinalysis does not show infection.  Covid test is negative.  CMP and CBC reassuring with no evidence of AKI.  Lipase not elevated.  Patient was given some fluids and he reported feeling better after the fluids.  Suspect he was slightly dehydrated but not significant enough for needing admission.  Patient likely is a combination of the viral diarrhea from his son as well as using the motility medication with his narcotic related constipation  beforehand.  I suspect he is nearing completion of this diarrheal illness and given his lack of recent antibiotics have low suspicion for C. difficile.  Patient will follow with PCP in the neck several days after he was monitored for over 7 hours with well appearance.  Patient agrees with plan of discharge and was discharged in good condition to maintain hydration at home and follow-up with PCP.    Final Clinical Impression(s) / ED Diagnoses Final diagnoses:  Diarrhea, unspecified type    Rx / DC Orders ED Discharge Orders    None      Clinical Impression: 1. Diarrhea, unspecified type     Disposition: Discharge  Condition: Good  I have discussed the results, Dx and Tx plan with the pt(& family if present). He/she/they expressed understanding and agree(s) with the plan. Discharge instructions discussed at great length. Strict return precautions discussed and pt &/or family have verbalized understanding of the instructions. No further questions at time of discharge.    Discharge Medication List as of 07/27/2020  4:49 PM      Follow Up: Velna Hatchet, [place] [address] [place] DEPT [place] [id number] [facility] [address] [place] [phone]       Sofya Moustafa, Gwenyth Allegra, MD 07/27/20 [phone]

## 2020-07-27 NOTE — ED Notes (Signed)
Provider to bedside

## 2020-07-27 NOTE — Discharge Instructions (Addendum)
Your history and exams are consistent with diarrhea leading to some mild dehydration.  Your labs were overall reassuring with no evidence of acute kidney injury or significant electrolyte imbalance.  Your urinalysis did not show any evidence of urinary tract infection as we were concerned of.  A culture was sent however.  Your vital signs remained reassuring after rehydration.  We did test you for Covid and it is still in process.  Please follow-up with your PCP for these results.  Based on your reassuring observation for over 7 hours in the emergency department, we feel you are safe for discharge home.  Please rest and stay hydrated as I suspect this is combination of a viral diarrhea as well as a combination of your motility medication.  If any symptoms change or worsen, please return to the nearest emergency department.

## 2020-07-28 DIAGNOSIS — M6281 Muscle weakness (generalized): Secondary | ICD-10-CM | POA: Diagnosis not present

## 2020-07-28 DIAGNOSIS — R488 Other symbolic dysfunctions: Secondary | ICD-10-CM | POA: Diagnosis not present

## 2020-07-28 DIAGNOSIS — R41841 Cognitive communication deficit: Secondary | ICD-10-CM | POA: Diagnosis not present

## 2020-07-28 DIAGNOSIS — R2681 Unsteadiness on feet: Secondary | ICD-10-CM | POA: Diagnosis not present

## 2020-07-28 LAB — URINE CULTURE: Culture: 10000 — AB

## 2020-08-01 DIAGNOSIS — Z111 Encounter for screening for respiratory tuberculosis: Secondary | ICD-10-CM | POA: Diagnosis not present

## 2020-08-02 DIAGNOSIS — Z1159 Encounter for screening for other viral diseases: Secondary | ICD-10-CM | POA: Diagnosis not present

## 2020-08-02 DIAGNOSIS — Z20828 Contact with and (suspected) exposure to other viral communicable diseases: Secondary | ICD-10-CM | POA: Diagnosis not present

## 2020-08-02 DIAGNOSIS — M6281 Muscle weakness (generalized): Secondary | ICD-10-CM | POA: Diagnosis not present

## 2020-08-03 DIAGNOSIS — I4891 Unspecified atrial fibrillation: Secondary | ICD-10-CM | POA: Diagnosis not present

## 2020-08-03 DIAGNOSIS — R2681 Unsteadiness on feet: Secondary | ICD-10-CM | POA: Diagnosis not present

## 2020-08-03 DIAGNOSIS — G4700 Insomnia, unspecified: Secondary | ICD-10-CM | POA: Diagnosis not present

## 2020-08-03 DIAGNOSIS — G894 Chronic pain syndrome: Secondary | ICD-10-CM | POA: Diagnosis not present

## 2020-08-03 DIAGNOSIS — L0101 Non-bullous impetigo: Secondary | ICD-10-CM | POA: Diagnosis not present

## 2020-08-03 DIAGNOSIS — F330 Major depressive disorder, recurrent, mild: Secondary | ICD-10-CM | POA: Diagnosis not present

## 2020-08-03 DIAGNOSIS — M6281 Muscle weakness (generalized): Secondary | ICD-10-CM | POA: Diagnosis not present

## 2020-08-03 DIAGNOSIS — G2581 Restless legs syndrome: Secondary | ICD-10-CM | POA: Diagnosis not present

## 2020-08-03 DIAGNOSIS — Z85820 Personal history of malignant melanoma of skin: Secondary | ICD-10-CM | POA: Diagnosis not present

## 2020-08-03 DIAGNOSIS — Z029 Encounter for administrative examinations, unspecified: Secondary | ICD-10-CM | POA: Diagnosis not present

## 2020-08-03 DIAGNOSIS — L905 Scar conditions and fibrosis of skin: Secondary | ICD-10-CM | POA: Diagnosis not present

## 2020-08-03 DIAGNOSIS — M5416 Radiculopathy, lumbar region: Secondary | ICD-10-CM | POA: Diagnosis not present

## 2020-08-03 DIAGNOSIS — R21 Rash and other nonspecific skin eruption: Secondary | ICD-10-CM | POA: Diagnosis not present

## 2020-08-03 DIAGNOSIS — K5909 Other constipation: Secondary | ICD-10-CM | POA: Diagnosis not present

## 2020-08-04 DIAGNOSIS — M6281 Muscle weakness (generalized): Secondary | ICD-10-CM | POA: Diagnosis not present

## 2020-08-04 DIAGNOSIS — R488 Other symbolic dysfunctions: Secondary | ICD-10-CM | POA: Diagnosis not present

## 2020-08-04 DIAGNOSIS — R41841 Cognitive communication deficit: Secondary | ICD-10-CM | POA: Diagnosis not present

## 2020-08-08 DIAGNOSIS — R2681 Unsteadiness on feet: Secondary | ICD-10-CM | POA: Diagnosis not present

## 2020-08-08 DIAGNOSIS — M6281 Muscle weakness (generalized): Secondary | ICD-10-CM | POA: Diagnosis not present

## 2020-08-09 DIAGNOSIS — Z1159 Encounter for screening for other viral diseases: Secondary | ICD-10-CM | POA: Diagnosis not present

## 2020-08-09 DIAGNOSIS — Z20828 Contact with and (suspected) exposure to other viral communicable diseases: Secondary | ICD-10-CM | POA: Diagnosis not present

## 2020-08-09 DIAGNOSIS — R488 Other symbolic dysfunctions: Secondary | ICD-10-CM | POA: Diagnosis not present

## 2020-08-09 DIAGNOSIS — R41841 Cognitive communication deficit: Secondary | ICD-10-CM | POA: Diagnosis not present

## 2020-08-10 DIAGNOSIS — R488 Other symbolic dysfunctions: Secondary | ICD-10-CM | POA: Diagnosis not present

## 2020-08-10 DIAGNOSIS — R41841 Cognitive communication deficit: Secondary | ICD-10-CM | POA: Diagnosis not present

## 2020-08-11 DIAGNOSIS — M6281 Muscle weakness (generalized): Secondary | ICD-10-CM | POA: Diagnosis not present

## 2020-08-15 DIAGNOSIS — R41841 Cognitive communication deficit: Secondary | ICD-10-CM | POA: Diagnosis not present

## 2020-08-15 DIAGNOSIS — R488 Other symbolic dysfunctions: Secondary | ICD-10-CM | POA: Diagnosis not present

## 2020-08-15 DIAGNOSIS — M6281 Muscle weakness (generalized): Secondary | ICD-10-CM | POA: Diagnosis not present

## 2020-08-16 DIAGNOSIS — R2681 Unsteadiness on feet: Secondary | ICD-10-CM | POA: Diagnosis not present

## 2020-08-16 DIAGNOSIS — Z20828 Contact with and (suspected) exposure to other viral communicable diseases: Secondary | ICD-10-CM | POA: Diagnosis not present

## 2020-08-16 DIAGNOSIS — M6281 Muscle weakness (generalized): Secondary | ICD-10-CM | POA: Diagnosis not present

## 2020-08-16 DIAGNOSIS — Z1159 Encounter for screening for other viral diseases: Secondary | ICD-10-CM | POA: Diagnosis not present

## 2020-08-16 DIAGNOSIS — R488 Other symbolic dysfunctions: Secondary | ICD-10-CM | POA: Diagnosis not present

## 2020-08-16 DIAGNOSIS — R41841 Cognitive communication deficit: Secondary | ICD-10-CM | POA: Diagnosis not present

## 2020-08-18 DIAGNOSIS — G894 Chronic pain syndrome: Secondary | ICD-10-CM | POA: Diagnosis not present

## 2020-08-18 DIAGNOSIS — K5903 Drug induced constipation: Secondary | ICD-10-CM | POA: Diagnosis not present

## 2020-08-21 DIAGNOSIS — M6281 Muscle weakness (generalized): Secondary | ICD-10-CM | POA: Diagnosis not present

## 2020-08-22 DIAGNOSIS — Z20828 Contact with and (suspected) exposure to other viral communicable diseases: Secondary | ICD-10-CM | POA: Diagnosis not present

## 2020-08-22 DIAGNOSIS — M6281 Muscle weakness (generalized): Secondary | ICD-10-CM | POA: Diagnosis not present

## 2020-08-22 DIAGNOSIS — R488 Other symbolic dysfunctions: Secondary | ICD-10-CM | POA: Diagnosis not present

## 2020-08-22 DIAGNOSIS — R2681 Unsteadiness on feet: Secondary | ICD-10-CM | POA: Diagnosis not present

## 2020-08-22 DIAGNOSIS — R41841 Cognitive communication deficit: Secondary | ICD-10-CM | POA: Diagnosis not present

## 2020-08-22 DIAGNOSIS — Z1159 Encounter for screening for other viral diseases: Secondary | ICD-10-CM | POA: Diagnosis not present

## 2020-08-23 DIAGNOSIS — M6281 Muscle weakness (generalized): Secondary | ICD-10-CM | POA: Diagnosis not present

## 2020-08-24 ENCOUNTER — Other Ambulatory Visit: Payer: Self-pay

## 2020-08-24 ENCOUNTER — Ambulatory Visit (HOSPITAL_COMMUNITY)
Admission: RE | Admit: 2020-08-24 | Discharge: 2020-08-24 | Disposition: A | Discharge status: Home / Self Care | Payer: Medicare PPO | Source: Ambulatory Visit | Attending: Nurse Practitioner | Admitting: Nurse Practitioner

## 2020-08-24 ENCOUNTER — Encounter (HOSPITAL_COMMUNITY): Payer: Self-pay | Admitting: Nurse Practitioner

## 2020-08-24 VITALS — BP 104/64 | HR 98 | Ht 71.0 in | Wt 123.2 lb

## 2020-08-24 DIAGNOSIS — Z7901 Long term (current) use of anticoagulants: Secondary | ICD-10-CM | POA: Insufficient documentation

## 2020-08-24 DIAGNOSIS — Z79899 Other long term (current) drug therapy: Secondary | ICD-10-CM | POA: Diagnosis not present

## 2020-08-24 DIAGNOSIS — I4510 Unspecified right bundle-branch block: Secondary | ICD-10-CM | POA: Insufficient documentation

## 2020-08-24 DIAGNOSIS — R634 Abnormal weight loss: Secondary | ICD-10-CM | POA: Insufficient documentation

## 2020-08-24 DIAGNOSIS — R2681 Unsteadiness on feet: Secondary | ICD-10-CM | POA: Diagnosis not present

## 2020-08-24 DIAGNOSIS — Z8546 Personal history of malignant neoplasm of prostate: Secondary | ICD-10-CM | POA: Diagnosis not present

## 2020-08-24 DIAGNOSIS — I4819 Other persistent atrial fibrillation: Secondary | ICD-10-CM | POA: Diagnosis not present

## 2020-08-24 DIAGNOSIS — N189 Chronic kidney disease, unspecified: Secondary | ICD-10-CM | POA: Insufficient documentation

## 2020-08-24 DIAGNOSIS — D6869 Other thrombophilia: Secondary | ICD-10-CM

## 2020-08-24 DIAGNOSIS — M6281 Muscle weakness (generalized): Secondary | ICD-10-CM | POA: Diagnosis not present

## 2020-08-24 DIAGNOSIS — R41841 Cognitive communication deficit: Secondary | ICD-10-CM | POA: Diagnosis not present

## 2020-08-24 DIAGNOSIS — R488 Other symbolic dysfunctions: Secondary | ICD-10-CM | POA: Diagnosis not present

## 2020-08-24 LAB — CBC
HCT: 42.8 % (ref 39.0–52.0)
Hemoglobin: 13.3 g/dL (ref 13.0–17.0)
MCH: 29.5 pg (ref 26.0–34.0)
MCHC: 31.1 g/dL (ref 30.0–36.0)
MCV: 94.9 fL (ref 80.0–100.0)
Platelets: 148 10*3/uL — ABNORMAL LOW (ref 150–400)
RBC: 4.51 MIL/uL (ref 4.22–5.81)
RDW: 12.9 % (ref 11.5–15.5)
WBC: 7.3 10*3/uL (ref 4.0–10.5)
nRBC: 0 % (ref 0.0–0.2)

## 2020-08-24 NOTE — Progress Notes (Signed)
Primary Care Physician: Velna Hatchet, MD Referring Physician: [facility] ER  F/U    Casey Gates is a 86 y.o. male with  that lives independently at [facility] and was evaluated in the ER for a mechanical  Fall 06/25/20 . He was arranging things in his closet and fell backward landing on his buttock and striking his rt knee. He had to scoot on his bottom to get to  a chair. This resulted in an abrasion to his buttock.This process took around 1.5 hours. His EKG showed afib with RVR, which is a new finding.Pt was asymptomatic.  ER physician conferred with cardiology. They did not recommend any further testing. Suggested rate control. He was not determined to be a good candidate for anticoagulation due to advanced age and falls, previous GI bleed.   In the office today, pt is with his son who is primarily the historian for the pt. He states that he had a GI bleed in the past due to a tumor and once he had surgery to remove this he has not had any further bleeding issues. He has had this one recent  fall but no other falls that he is aware. Ekg  shows today that he is rate controlled. He is not on daily asa. He has had both covid shots.   F/u in afib clinic, 8.13. Pt is doing well on anticoagulation per son. No further falls.  Pt denies any blood seen in urine or stool. He [facility] and CBC was actually improved form last CBC. His HR is the cliinc in at 100 bpm, which is acceptable with pt's advanced age and sedentary. I can't increase rate control as pt has soft BP. Son will monitor HR /BP at home and call in readings in about one week.   F/u in afib clinic, 08/25/20. He remains rate controlled. No further falls. He has had continued weight loss and sees his PCP about this tomorrow. He has been moved into assisted care now at [facility]. He has not seen any blood in his urine or stool.he is pending 10 teeth to be extracted and the dentist is anticipating him coming off the  blood thinner x 2 doses.    Today, he denies symptoms of palpitations, chest pain, shortness of breath, orthopnea, PND, lower extremity edema, dizziness, presyncope, syncope, or neurologic sequela. The patient is tolerating medications without difficulties and is otherwise without complaint today.   Past Medical History:  Diagnosis Date  . Anemia   . Asthma    AS A CHILD  . Blind left eye   . Cancer [facility])    PROSTATE CANCER  . Cellulitis    frequently, "all over"  . Chronic kidney disease   . Chronic pain syndrome   . Chronic ulcer of toe, right, with unspecified severity ([place])    2nd toe  . Constipation   . DDD (degenerative disc disease), lumbar   . Gastrointestinal stromal tumor (GIST) of stomach ([place])   . GI bleeding   . History of blood transfusion    01/2018  . Insomnia   . Kyphoscoliosis   . Mass in the abdomen 01/2018  . Restless leg   . Sleep apnea    does not wear CPAP  . Wears glasses    Past Surgical History:  Procedure Laterality Date  . AMPUTATION TOE Right 11/05/2018   Procedure: Right 2nd toe ampuation;  Surgeon: Wylene Simmer, MD;  Location: [place];  Service: Orthopedics;  Laterality: Right;  26min  . CHOLECYSTECTOMY    . COLONOSCOPY    . ESOPHAGOGASTRODUODENOSCOPY (EGD) WITH PROPOFOL Left 02/16/2018   Procedure: ESOPHAGOGASTRODUODENOSCOPY (EGD) WITH PROPOFOL;  Surgeon: [name], MD;  Location: [place];  Service: Gastroenterology;  Laterality: Left;  . LAPAROSCOPIC GASTRECTOMY N/A 03/12/2018   Procedure: LAPAROSCOPIC ASSISTED PARTIAL GASTRECTOMY ERAS PATHWAY;  Surgeon: Stark Klein, MD;  Location: [place];  Service: General;  Laterality: N/A;  . LAPAROSCOPIC PARTIAL GASTRECTOMY  03/12/2018   LAPAROSCOPIC ASSISTED PARTIAL GASTRECTOMY ERAS PATHWAY  . PROSTATECTOMY    . QUADRICEPS TENDON REPAIR Left 07/07/2017   Procedure: REPAIR QUADRICEP TENDON;  Surgeon: Rod Can, MD;  Location: [place];  Service: Orthopedics;  Laterality: Left;    . RIGHT CATARACT EXTRACTION  2013  . RIGTH NEPHRECTOMY  2006  . TONSILLECTOMY    . TRANSURETHRAL RESECTION OF PROSTATE  1997    Current Outpatient Medications  Medication Sig Dispense Refill  . apixaban (ELIQUIS) 2.5 MG TABS tablet Take 1 tablet (2.5 mg total) by mouth 2 (two) times daily. 60 tablet 3  . diltiazem (CARDIZEM SR) 120 MG 12 hr capsule Take 1 capsule (120 mg total) by mouth 2 (two) times daily. 60 capsule 5  . gabapentin (NEURONTIN) 300 MG capsule Take 1 capsule (300 mg total) by mouth 3 (three) times daily. (Patient taking differently: Take 300 mg by mouth as needed. )    . mirtazapine (REMERON) 30 MG tablet Take 30 mg by mouth at bedtime.    Marland Kitchen MOVANTIK 25 MG TABS tablet Take 1 tablet by mouth daily.    Marland Kitchen oxymorphone (OPANA) 10 MG tablet Take 10 mg by mouth every 6 (six) hours as needed for pain.     Vladimir Faster Glycol-Propyl Glycol (SYSTANE) 0.4-0.3 % GEL ophthalmic gel Place 1 application into both eyes 2 (two) times daily.     No current facility-administered medications for this encounter.    Allergies  Allergen Reactions  . Coconut Flavor [Flavoring Agent] Hypertension    Anything that is related to coconut---LOSS OF CONSCIOUSNESS ([place])  . Coconut Oil Other (See Comments)    Anything that is related to coconut--- LOSS OF CONSCIOUSNESS (SYNCOPE)  . Doxycycline Hyclate Hives  . Food Anaphylaxis    ALLERGIC TO ALL NUTS WITH THE EXCEPTION OF CASHEWS, ALMONDS    Social History   Socioeconomic History  . Marital status: Widowed    Spouse name: Not on file  . Number of children: 2  . Years of education: Not on file  . Highest education level: Not on file  Occupational History  . Not on file  Tobacco Use  . Smoking status: Never Smoker  . Smokeless tobacco: Never Used  Vaping Use  . Vaping Use: Never used  Substance and Sexual Activity  . Alcohol use: No    Alcohol/week: 0.0 standard drinks    Comment: quit 1985, 2 fifths/ week,S/P rehab  . Drug use:  No  . Sexual activity: Never  Other Topics Concern  . Not on file  Social History Narrative   Caffeine 2-3 cups average.  Widowed,  Lives at [facility], [place]. Living.  Retired Forensic psychologist.  One son.   Social Determinants of Health   Financial Resource Strain:   . Difficulty of Paying Living Expenses: Not on file  Food Insecurity:   . Worried About Charity fundraiser in the Last Year: Not on file  . Ran Out of Food in the Last Year: Not on file  Transportation Needs:   . Film/video editor (Medical): Not on file  . Lack of Transportation (Non-Medical): Not on file  Physical Activity:   . Days of Exercise per Week: Not on file  . Minutes of Exercise per Session: Not on file  Stress:   . Feeling of Stress : Not on file  Social Connections:   . Frequency of Communication with Friends and Family: Not on file  . Frequency of Social Gatherings with Friends and Family: Not on file  . Attends Religious Services: Not on file  . Active Member of Clubs or Organizations: Not on file  . Attends Archivist Meetings: Not on file  . Marital Status: Not on file  Intimate Partner Violence:   . Fear of Current or Ex-Partner: Not on file  . Emotionally Abused: Not on file  . Physically Abused: Not on file  . Sexually Abused: Not on file    Family History  Problem Relation Age of Onset  . Bone cancer Mother   . Hypertension Mother   . Arthritis Mother   . Clotting disorder Daughter        died of blood clot     ROS- All systems are reviewed and negative except as per the HPI above  Physical Exam: Vitals:   08/24/20 1017  BP: 104/64  Pulse: 98  Weight: 55.9 kg  Height: 5\' 11"  (1.803 m)   Wt Readings from Last 3 Encounters:  08/24/20 55.9 kg  07/14/20 58.5 kg  06/28/20 59.2 kg    Labs: Lab Results  Component Value Date   NA 139 07/27/2020   K 4.4 07/27/2020   CL 102 07/27/2020   CO2 27 07/27/2020   GLUCOSE 102 (H) 07/27/2020   BUN 22 07/27/2020    CREATININE 1.08 07/27/2020   CALCIUM 9.2 07/27/2020   PHOS 2.9 12/31/2017   MG 2.2 06/26/2020   Lab Results  Component Value Date   INR 1.43 03/18/2018   No results found for: CHOL, HDL, LDLCALC, TRIG   GEN- The patient is well appearing, alert and oriented x 3 today.   Head- normocephalic, atraumatic Eyes-  Sclera clear, conjunctiva pink Ears- hearing intact Oropharynx- clear Neck- supple, no JVP Lymph- no cervical lymphadenopathy Lungs- Clear to ausculation bilaterally, normal work of breathing Heart- irregular rate and rhythm, no murmurs, rubs or gallops, PMI not laterally displaced GI- soft, NT, ND, + BS Extremities- no clubbing, cyanosis, or edema MS- no significant deformity or atrophy Skin- no rash or lesion Psych- euthymic mood, full affect Neuro- strength and sensation are intact  EKG-afib at 98 bpm, qrs int 140 ms, qtc 484 ms  ER records reviewed   Assessment and Plan: 1. Persistent  afib    Pt seems to be asymptomatic  He is reasonably  rate controlled  Continue  cardizem 120 mg bid    2. CHA2DS2VASc score of 2 (age)  Continue  eliquis 2.5 mg bid  Cbc today   3. Weight  loss  Has pcp f/u tomorrow to evaluate    I will see back in 3 months    Butch Penny C. Phoebe Marter, [facility] [address] [place], [place] [address] [phone]

## 2020-08-25 DIAGNOSIS — Z1159 Encounter for screening for other viral diseases: Secondary | ICD-10-CM | POA: Diagnosis not present

## 2020-08-25 DIAGNOSIS — R41841 Cognitive communication deficit: Secondary | ICD-10-CM | POA: Diagnosis not present

## 2020-08-25 DIAGNOSIS — D692 Other nonthrombocytopenic purpura: Secondary | ICD-10-CM | POA: Diagnosis not present

## 2020-08-25 DIAGNOSIS — K5909 Other constipation: Secondary | ICD-10-CM | POA: Diagnosis not present

## 2020-08-25 DIAGNOSIS — F330 Major depressive disorder, recurrent, mild: Secondary | ICD-10-CM | POA: Diagnosis not present

## 2020-08-25 DIAGNOSIS — Z9181 History of falling: Secondary | ICD-10-CM | POA: Diagnosis not present

## 2020-08-25 DIAGNOSIS — Z20828 Contact with and (suspected) exposure to other viral communicable diseases: Secondary | ICD-10-CM | POA: Diagnosis not present

## 2020-08-25 DIAGNOSIS — G894 Chronic pain syndrome: Secondary | ICD-10-CM | POA: Diagnosis not present

## 2020-08-25 DIAGNOSIS — I4891 Unspecified atrial fibrillation: Secondary | ICD-10-CM | POA: Diagnosis not present

## 2020-08-25 DIAGNOSIS — M6281 Muscle weakness (generalized): Secondary | ICD-10-CM | POA: Diagnosis not present

## 2020-08-25 DIAGNOSIS — R488 Other symbolic dysfunctions: Secondary | ICD-10-CM | POA: Diagnosis not present

## 2020-08-25 DIAGNOSIS — D6869 Other thrombophilia: Secondary | ICD-10-CM | POA: Diagnosis not present

## 2020-08-25 DIAGNOSIS — G4700 Insomnia, unspecified: Secondary | ICD-10-CM | POA: Diagnosis not present

## 2020-08-28 DIAGNOSIS — R41841 Cognitive communication deficit: Secondary | ICD-10-CM | POA: Diagnosis not present

## 2020-08-28 DIAGNOSIS — R488 Other symbolic dysfunctions: Secondary | ICD-10-CM | POA: Diagnosis not present

## 2020-08-28 DIAGNOSIS — R2681 Unsteadiness on feet: Secondary | ICD-10-CM | POA: Diagnosis not present

## 2020-08-28 DIAGNOSIS — M6281 Muscle weakness (generalized): Secondary | ICD-10-CM | POA: Diagnosis not present

## 2020-08-29 DIAGNOSIS — Z1159 Encounter for screening for other viral diseases: Secondary | ICD-10-CM | POA: Diagnosis not present

## 2020-08-29 DIAGNOSIS — Z20828 Contact with and (suspected) exposure to other viral communicable diseases: Secondary | ICD-10-CM | POA: Diagnosis not present

## 2020-08-29 DIAGNOSIS — M6281 Muscle weakness (generalized): Secondary | ICD-10-CM | POA: Diagnosis not present

## 2020-08-31 DIAGNOSIS — M6281 Muscle weakness (generalized): Secondary | ICD-10-CM | POA: Diagnosis not present

## 2020-08-31 DIAGNOSIS — R2681 Unsteadiness on feet: Secondary | ICD-10-CM | POA: Diagnosis not present

## 2020-09-01 ENCOUNTER — Emergency Department (HOSPITAL_COMMUNITY): Payer: Medicare PPO

## 2020-09-01 ENCOUNTER — Other Ambulatory Visit: Payer: Self-pay

## 2020-09-01 ENCOUNTER — Emergency Department (HOSPITAL_COMMUNITY)
Admission: EM | Admit: 2020-09-01 | Discharge: 2020-09-01 | Disposition: A | Discharge status: Home / Self Care | Payer: Medicare PPO | Attending: Emergency Medicine | Admitting: Emergency Medicine

## 2020-09-01 ENCOUNTER — Encounter (HOSPITAL_COMMUNITY): Payer: Self-pay | Admitting: *Deleted

## 2020-09-01 ENCOUNTER — Encounter (HOSPITAL_COMMUNITY): Payer: Self-pay | Admitting: Nurse Practitioner

## 2020-09-01 DIAGNOSIS — W19XXXA Unspecified fall, initial encounter: Secondary | ICD-10-CM

## 2020-09-01 DIAGNOSIS — S80211A Abrasion, right knee, initial encounter: Secondary | ICD-10-CM | POA: Diagnosis not present

## 2020-09-01 DIAGNOSIS — Z7901 Long term (current) use of anticoagulants: Secondary | ICD-10-CM | POA: Diagnosis not present

## 2020-09-01 DIAGNOSIS — M545 Low back pain, unspecified: Secondary | ICD-10-CM | POA: Diagnosis not present

## 2020-09-01 DIAGNOSIS — S80911A Unspecified superficial injury of right knee, initial encounter: Secondary | ICD-10-CM | POA: Diagnosis present

## 2020-09-01 DIAGNOSIS — W010XXA Fall on same level from slipping, tripping and stumbling without subsequent striking against object, initial encounter: Secondary | ICD-10-CM | POA: Diagnosis not present

## 2020-09-01 DIAGNOSIS — I959 Hypotension, unspecified: Secondary | ICD-10-CM | POA: Diagnosis not present

## 2020-09-01 DIAGNOSIS — M79641 Pain in right hand: Secondary | ICD-10-CM | POA: Diagnosis not present

## 2020-09-01 DIAGNOSIS — R519 Headache, unspecified: Secondary | ICD-10-CM | POA: Diagnosis not present

## 2020-09-01 DIAGNOSIS — I4891 Unspecified atrial fibrillation: Secondary | ICD-10-CM | POA: Diagnosis not present

## 2020-09-01 DIAGNOSIS — R0902 Hypoxemia: Secondary | ICD-10-CM | POA: Diagnosis not present

## 2020-09-01 DIAGNOSIS — M25561 Pain in right knee: Secondary | ICD-10-CM | POA: Diagnosis not present

## 2020-09-01 DIAGNOSIS — S0081XA Abrasion of other part of head, initial encounter: Secondary | ICD-10-CM | POA: Insufficient documentation

## 2020-09-01 DIAGNOSIS — M25551 Pain in right hip: Secondary | ICD-10-CM | POA: Insufficient documentation

## 2020-09-01 DIAGNOSIS — R52 Pain, unspecified: Secondary | ICD-10-CM

## 2020-09-01 DIAGNOSIS — S0990XA Unspecified injury of head, initial encounter: Secondary | ICD-10-CM | POA: Diagnosis not present

## 2020-09-01 DIAGNOSIS — Z043 Encounter for examination and observation following other accident: Secondary | ICD-10-CM | POA: Diagnosis not present

## 2020-09-01 HISTORY — DX: Unspecified atrial fibrillation: I48.91

## 2020-09-01 LAB — CBC
HCT: 43.5 % (ref 39.0–52.0)
Hemoglobin: 13.4 g/dL (ref 13.0–17.0)
MCH: 29.2 pg (ref 26.0–34.0)
MCHC: 30.8 g/dL (ref 30.0–36.0)
MCV: 94.8 fL (ref 80.0–100.0)
Platelets: 148 10*3/uL — ABNORMAL LOW (ref 150–400)
RBC: 4.59 MIL/uL (ref 4.22–5.81)
RDW: 13.5 % (ref 11.5–15.5)
WBC: 9.1 10*3/uL (ref 4.0–10.5)
nRBC: 0 % (ref 0.0–0.2)

## 2020-09-01 LAB — SAMPLE TO BLOOD BANK

## 2020-09-01 LAB — COMPREHENSIVE METABOLIC PANEL
ALT: 35 U/L (ref 0–44)
AST: 39 U/L (ref 15–41)
Albumin: 3.8 g/dL (ref 3.5–5.0)
Alkaline Phosphatase: 67 U/L (ref 38–126)
Anion gap: 8 (ref 5–15)
BUN: 24 mg/dL — ABNORMAL HIGH (ref 8–23)
CO2: 29 mmol/L (ref 22–32)
Calcium: 9.4 mg/dL (ref 8.9–10.3)
Chloride: 106 mmol/L (ref 98–111)
Creatinine, Ser: 0.98 mg/dL (ref 0.61–1.24)
GFR calc Af Amer: >60 mL/min (ref >60)
GFR calc non Af Amer: >60 mL/min (ref >60)
Glucose, Bld: 88 mg/dL (ref 70–99)
Potassium: 4 mmol/L (ref 3.5–5.1)
Sodium: 143 mmol/L (ref 135–145)
Total Bilirubin: 0.4 mg/dL (ref 0.3–1.2)
Total Protein: 6.3 g/dL — ABNORMAL LOW (ref 6.5–8.1)

## 2020-09-01 LAB — PROTIME-INR
INR: 1.2 (ref 0.8–1.2)
Prothrombin Time: 14.3 seconds (ref 11.4–15.2)

## 2020-09-01 LAB — LACTIC ACID, PLASMA: Lactic Acid, Venous: 1.2 mmol/L (ref 0.5–1.9)

## 2020-09-01 NOTE — ED Notes (Signed)
Actual first o2 sat was 97 % on RA not 16

## 2020-09-01 NOTE — Progress Notes (Signed)
Orthopedic Tech Progress Note Patient Details:  Casey Gates 12/02/1873 [id number] Level 2 trauma Patient ID: Casey Gates, male   DOB: 12/02/1873, 86 y.o.   MRN: [id number]   [name] 09/01/2020, 7:49 AM

## 2020-09-01 NOTE — ED Provider Notes (Signed)
[place] EMERGENCY DEPARTMENT Provider Note   CSN: [id number] Arrival date & time: 09/01/20  0744     History No chief complaint on file.   Casey Gates is a 86 y.o. male who presents form assisted memory care at [facility] via EMS for ground level fall on Eliquis. Hx is gathered by patient and EMS.The patient states that he fell  About 3 months ago and has had pain in the R hip, leg and knee since then. He had PT yesterday and feels like his pain was worse this morning which he feels contributed to his mechanical fall. The patient states that he tripped over his feet and fell forward hitting his head and R knee. He c/o pain in these 2 areas. Patient is chornically anticoagulated on Eliquis and took a dose this morning. He has a hx of left eye blindness.  HPI     No past medical history on file.  There are no problems to display for this patient.        No family history on file.  Social History   Tobacco Use  . Smoking status: Not on file  Substance Use Topics  . Alcohol use: Not on file  . Drug use: Not on file    Home Medications Prior to Admission medications   Not on File    Allergies    Patient has no allergy information on record.  Review of Systems   Review of Systems Ten systems reviewed and are negative for acute change, except as noted in the HPI.   Physical Exam Updated Vital Signs There were no vitals taken for this visit.  Physical Exam Vitals reviewed.  Constitutional:      Appearance: He is underweight. He is not ill-appearing.  HENT:     Head: Normocephalic. Abrasion present.      Right Ear: Tympanic membrane normal. No hemotympanum.     Left Ear: Tympanic membrane normal. No hemotympanum.     Nose: Nose normal.     Left Nostril: No epistaxis.  Eyes:     Extraocular Movements: Extraocular movements intact.     Pupils: Pupils are equal, round, and reactive to light.  Cardiovascular:     Rate and Rhythm:  Normal rate.     Pulses: Normal pulses.     Heart sounds: Normal heart sounds.  Pulmonary:     Effort: Pulmonary effort is normal.     Breath sounds: Normal breath sounds.  Chest:     Chest wall: No deformity, tenderness or crepitus.  Abdominal:     General: Abdomen is scaphoid.     Tenderness: There is no abdominal tenderness.  Musculoskeletal:        General: No swelling or deformity. Normal range of motion.     Cervical back: Normal range of motion.     Comments: Moves upper and lower extremities without difficulty.  Pelvis does not give to pressure  Skin:    Findings: Abrasion present.       Neurological:     Mental Status: He is alert.     ED Results / Procedures / Treatments   Labs (all labs ordered are listed, but only abnormal results are displayed) Labs Reviewed  COMPREHENSIVE METABOLIC PANEL  CBC  URINALYSIS, ROUTINE W REFLEX MICROSCOPIC  LACTIC ACID, PLASMA  PROTIME-INR  I-STAT CHEM 8, ED  SAMPLE TO BLOOD BANK    EKG None  Radiology No results found.  Procedures Procedures (including critical care time)  Medications Ordered in ED Medications - No data to display  ED Course  I have reviewed the triage vital signs and the nursing notes.  Pertinent labs & imaging results that were available during my care of the patient were reviewed by me and considered in my medical decision making (see chart for details).    MDM Rules/Calculators/A&P                         8:08 AM BP 105/81 (BP Location: Left Arm)   Pulse 89   Temp (!) 97.5 F (36.4 C) (Oral)   Resp 16   Ht 6' (1.829 m)   Wt 56.7 kg   SpO2 (!) 16%   BMI 16.95 kg/m  Patient seen on arrival as Level 2 trauma. Injuries seem limited to Face and R knee. Injuries appear minor.  Patient with mechanical fall.  I ordered labs including CBC, CMP without significant abnormality, PT/INR and lactic acid within normal limits.  I ordered and reviewed CT head, C-spine, plain films of the chest,  pelvis, lumbar spine, right knee and hand all of which show no abnormalities.  EKG shows rate controlled A. fib at a rate of 97 unchanged from previous.  Findings discussed with the patient's son who is at bedside.  He appears appropriate for discharge back to his facility at this time.  Final Clinical Impression(s) / ED Diagnoses Final diagnoses:  Fall  Fall  Fall    Rx / DC Orders ED Discharge Orders    None       Margarita Mail, PA-C 09/01/20 1501    Margette Fast, MD 09/06/20 2015

## 2020-09-01 NOTE — ED Notes (Signed)
Patient and family updated , aware they are waiitng on xray results.

## 2020-09-01 NOTE — Discharge Instructions (Addendum)
There were no abnormalities on the CT scans. You may take tylenol  for  pain. Follow up with your primary care doctor. Return for any new or worsening conditions.

## 2020-09-01 NOTE — ED Triage Notes (Signed)
Patient presents to ed via [facility] , states he was walking with his walker and slipped on the hardwood floor, abrasion to right knee, small red abrasion above right eye, no loc. Patient is alert and oriented at baseline

## 2020-09-04 DIAGNOSIS — M6281 Muscle weakness (generalized): Secondary | ICD-10-CM | POA: Diagnosis not present

## 2020-09-05 DIAGNOSIS — Z20828 Contact with and (suspected) exposure to other viral communicable diseases: Secondary | ICD-10-CM | POA: Diagnosis not present

## 2020-09-05 DIAGNOSIS — R2681 Unsteadiness on feet: Secondary | ICD-10-CM | POA: Diagnosis not present

## 2020-09-05 DIAGNOSIS — Z1159 Encounter for screening for other viral diseases: Secondary | ICD-10-CM | POA: Diagnosis not present

## 2020-09-05 DIAGNOSIS — M6281 Muscle weakness (generalized): Secondary | ICD-10-CM | POA: Diagnosis not present

## 2020-09-06 DIAGNOSIS — M6281 Muscle weakness (generalized): Secondary | ICD-10-CM | POA: Diagnosis not present

## 2020-09-07 DIAGNOSIS — M6281 Muscle weakness (generalized): Secondary | ICD-10-CM | POA: Diagnosis not present

## 2020-09-07 DIAGNOSIS — R2681 Unsteadiness on feet: Secondary | ICD-10-CM | POA: Diagnosis not present

## 2020-09-08 DIAGNOSIS — M6281 Muscle weakness (generalized): Secondary | ICD-10-CM | POA: Diagnosis not present

## 2020-09-08 DIAGNOSIS — Z1159 Encounter for screening for other viral diseases: Secondary | ICD-10-CM | POA: Diagnosis not present

## 2020-09-08 DIAGNOSIS — Z20828 Contact with and (suspected) exposure to other viral communicable diseases: Secondary | ICD-10-CM | POA: Diagnosis not present

## 2020-09-11 DIAGNOSIS — M6281 Muscle weakness (generalized): Secondary | ICD-10-CM | POA: Diagnosis not present

## 2020-09-11 DIAGNOSIS — R2681 Unsteadiness on feet: Secondary | ICD-10-CM | POA: Diagnosis not present

## 2020-09-12 DIAGNOSIS — M6281 Muscle weakness (generalized): Secondary | ICD-10-CM | POA: Diagnosis not present

## 2020-09-12 DIAGNOSIS — Z1159 Encounter for screening for other viral diseases: Secondary | ICD-10-CM | POA: Diagnosis not present

## 2020-09-12 DIAGNOSIS — R7612 Nonspecific reaction to cell mediated immunity measurement of gamma interferon antigen response without active tuberculosis: Secondary | ICD-10-CM | POA: Diagnosis not present

## 2020-09-12 DIAGNOSIS — Z20828 Contact with and (suspected) exposure to other viral communicable diseases: Secondary | ICD-10-CM | POA: Diagnosis not present

## 2020-09-13 DIAGNOSIS — M6281 Muscle weakness (generalized): Secondary | ICD-10-CM | POA: Diagnosis not present

## 2020-09-13 DIAGNOSIS — R2681 Unsteadiness on feet: Secondary | ICD-10-CM | POA: Diagnosis not present

## 2020-09-15 DIAGNOSIS — M6281 Muscle weakness (generalized): Secondary | ICD-10-CM | POA: Diagnosis not present

## 2020-09-15 DIAGNOSIS — R2681 Unsteadiness on feet: Secondary | ICD-10-CM | POA: Diagnosis not present

## 2020-09-15 DIAGNOSIS — Z1159 Encounter for screening for other viral diseases: Secondary | ICD-10-CM | POA: Diagnosis not present

## 2020-09-15 DIAGNOSIS — Z20828 Contact with and (suspected) exposure to other viral communicable diseases: Secondary | ICD-10-CM | POA: Diagnosis not present

## 2020-09-16 DIAGNOSIS — R2681 Unsteadiness on feet: Secondary | ICD-10-CM | POA: Diagnosis not present

## 2020-09-16 DIAGNOSIS — M6281 Muscle weakness (generalized): Secondary | ICD-10-CM | POA: Diagnosis not present

## 2020-09-18 DIAGNOSIS — M6281 Muscle weakness (generalized): Secondary | ICD-10-CM | POA: Diagnosis not present

## 2020-09-18 DIAGNOSIS — R2681 Unsteadiness on feet: Secondary | ICD-10-CM | POA: Diagnosis not present

## 2020-09-19 DIAGNOSIS — Z20828 Contact with and (suspected) exposure to other viral communicable diseases: Secondary | ICD-10-CM | POA: Diagnosis not present

## 2020-09-19 DIAGNOSIS — Z1159 Encounter for screening for other viral diseases: Secondary | ICD-10-CM | POA: Diagnosis not present

## 2020-09-19 DIAGNOSIS — M6281 Muscle weakness (generalized): Secondary | ICD-10-CM | POA: Diagnosis not present

## 2020-09-19 DIAGNOSIS — H02105 Unspecified ectropion of left lower eyelid: Secondary | ICD-10-CM | POA: Diagnosis not present

## 2020-09-19 DIAGNOSIS — H52201 Unspecified astigmatism, right eye: Secondary | ICD-10-CM | POA: Diagnosis not present

## 2020-09-19 DIAGNOSIS — H2522 Age-related cataract, morgagnian type, left eye: Secondary | ICD-10-CM | POA: Diagnosis not present

## 2020-09-20 DIAGNOSIS — M6281 Muscle weakness (generalized): Secondary | ICD-10-CM | POA: Diagnosis not present

## 2020-09-20 DIAGNOSIS — R2681 Unsteadiness on feet: Secondary | ICD-10-CM | POA: Diagnosis not present

## 2020-09-21 DIAGNOSIS — Z111 Encounter for screening for respiratory tuberculosis: Secondary | ICD-10-CM | POA: Diagnosis not present

## 2020-09-21 DIAGNOSIS — M6281 Muscle weakness (generalized): Secondary | ICD-10-CM | POA: Diagnosis not present

## 2020-09-22 DIAGNOSIS — Z20828 Contact with and (suspected) exposure to other viral communicable diseases: Secondary | ICD-10-CM | POA: Diagnosis not present

## 2020-09-22 DIAGNOSIS — Z1159 Encounter for screening for other viral diseases: Secondary | ICD-10-CM | POA: Diagnosis not present

## 2020-09-25 DIAGNOSIS — M6281 Muscle weakness (generalized): Secondary | ICD-10-CM | POA: Diagnosis not present

## 2020-09-26 DIAGNOSIS — M6281 Muscle weakness (generalized): Secondary | ICD-10-CM | POA: Diagnosis not present

## 2020-09-26 DIAGNOSIS — Z20828 Contact with and (suspected) exposure to other viral communicable diseases: Secondary | ICD-10-CM | POA: Diagnosis not present

## 2020-09-26 DIAGNOSIS — R2681 Unsteadiness on feet: Secondary | ICD-10-CM | POA: Diagnosis not present

## 2020-09-26 DIAGNOSIS — Z1159 Encounter for screening for other viral diseases: Secondary | ICD-10-CM | POA: Diagnosis not present

## 2020-09-27 ENCOUNTER — Encounter: Payer: Self-pay | Admitting: Podiatry

## 2020-09-27 ENCOUNTER — Ambulatory Visit: Payer: Medicare PPO | Admitting: Podiatry

## 2020-09-27 ENCOUNTER — Other Ambulatory Visit: Payer: Self-pay

## 2020-09-27 DIAGNOSIS — B351 Tinea unguium: Secondary | ICD-10-CM

## 2020-09-27 DIAGNOSIS — D689 Coagulation defect, unspecified: Secondary | ICD-10-CM | POA: Diagnosis not present

## 2020-09-27 DIAGNOSIS — M6281 Muscle weakness (generalized): Secondary | ICD-10-CM | POA: Diagnosis not present

## 2020-09-27 DIAGNOSIS — M79676 Pain in unspecified toe(s): Secondary | ICD-10-CM | POA: Diagnosis not present

## 2020-09-27 NOTE — Progress Notes (Signed)
This patient returns to the office for evaluation and treatment of long thick painful nails .  This patient is unable to trim his own nails since the patient cannot reach the feet.  Patient says the nails are painful walking and wearing his shoes.  He returns for preventive foot care services. Amputated toe right foot.  General Appearance  Alert, conversant and in no acute stress.  Vascular  Dorsalis pedis and posterior tibial  pulses are weakly  palpable  bilaterally.  Capillary return is within normal limits  bilaterally. Temperature is within normal limits  bilaterally.  Neurologic  Senn-Weinstein monofilament wire test within normal limits  bilaterally. Muscle power within normal limits bilaterally.  Nails Thick disfigured discolored nails with subungual debris  from hallux to fifth toes except amputated toe right foot.. No evidence of bacterial infection or drainage bilaterally.  Orthopedic  No limitations of motion  feet .  No crepitus or effusions noted.  No bony pathology or digital deformities noted.  Amputation second toe right foot.  Skin  normotropic skin with no porokeratosis noted bilaterally.  No signs of infections or ulcers noted.     Onychomycosis  Pain in toes right foot  Pain in toes left foot  Debridement  of nails  1-5 left and 1-4 right with a nail nipper.  Nails were then filed using a dremel tool with no incidents.    RTC 6 months.   Gardiner Barefoot DPM

## 2020-09-28 DIAGNOSIS — M6281 Muscle weakness (generalized): Secondary | ICD-10-CM | POA: Diagnosis not present

## 2020-09-28 DIAGNOSIS — R2681 Unsteadiness on feet: Secondary | ICD-10-CM | POA: Diagnosis not present

## 2020-09-29 DIAGNOSIS — Z20828 Contact with and (suspected) exposure to other viral communicable diseases: Secondary | ICD-10-CM | POA: Diagnosis not present

## 2020-09-29 DIAGNOSIS — M6281 Muscle weakness (generalized): Secondary | ICD-10-CM | POA: Diagnosis not present

## 2020-09-29 DIAGNOSIS — Z1159 Encounter for screening for other viral diseases: Secondary | ICD-10-CM | POA: Diagnosis not present

## 2020-10-02 DIAGNOSIS — R2681 Unsteadiness on feet: Secondary | ICD-10-CM | POA: Diagnosis not present

## 2020-10-02 DIAGNOSIS — M6281 Muscle weakness (generalized): Secondary | ICD-10-CM | POA: Diagnosis not present

## 2020-10-03 DIAGNOSIS — Z1159 Encounter for screening for other viral diseases: Secondary | ICD-10-CM | POA: Diagnosis not present

## 2020-10-03 DIAGNOSIS — M6281 Muscle weakness (generalized): Secondary | ICD-10-CM | POA: Diagnosis not present

## 2020-10-03 DIAGNOSIS — Z20828 Contact with and (suspected) exposure to other viral communicable diseases: Secondary | ICD-10-CM | POA: Diagnosis not present

## 2020-10-04 DIAGNOSIS — M6281 Muscle weakness (generalized): Secondary | ICD-10-CM | POA: Diagnosis not present

## 2020-10-04 DIAGNOSIS — R2681 Unsteadiness on feet: Secondary | ICD-10-CM | POA: Diagnosis not present

## 2020-10-05 DIAGNOSIS — M6281 Muscle weakness (generalized): Secondary | ICD-10-CM | POA: Diagnosis not present

## 2020-10-06 DIAGNOSIS — M6281 Muscle weakness (generalized): Secondary | ICD-10-CM | POA: Diagnosis not present

## 2020-10-06 DIAGNOSIS — R2681 Unsteadiness on feet: Secondary | ICD-10-CM | POA: Diagnosis not present

## 2020-10-09 DIAGNOSIS — M6281 Muscle weakness (generalized): Secondary | ICD-10-CM | POA: Diagnosis not present

## 2020-10-10 DIAGNOSIS — Z20828 Contact with and (suspected) exposure to other viral communicable diseases: Secondary | ICD-10-CM | POA: Diagnosis not present

## 2020-10-10 DIAGNOSIS — Z1159 Encounter for screening for other viral diseases: Secondary | ICD-10-CM | POA: Diagnosis not present

## 2020-10-10 DIAGNOSIS — R2681 Unsteadiness on feet: Secondary | ICD-10-CM | POA: Diagnosis not present

## 2020-10-10 DIAGNOSIS — M6281 Muscle weakness (generalized): Secondary | ICD-10-CM | POA: Diagnosis not present

## 2020-10-11 DIAGNOSIS — M6281 Muscle weakness (generalized): Secondary | ICD-10-CM | POA: Diagnosis not present

## 2020-10-11 NOTE — Progress Notes (Signed)
[place]   [phone] [phone]   Clinic Follow up Note   Patient Care Team: Velna Hatchet, MD as PCP - General (Internal Medicine) [name], MD as PCP - Cardiology (Cardiology) Velna Hatchet, MD (Internal Medicine)  Date of Service:  10/13/2020  CHIEF COMPLAINT: F/u of GIST  SUMMARY OF ONCOLOGIC HISTORY: Oncology History Overview Note  Cancer Staging Malignant gastrointestinal stromal tumor (GIST) of stomach ([place]) Staging form: Gastrointestinal Stromal Tumor - Gastric and Omental GIST, AJCC 8th Edition - Pathologic stage from 04/22/2018: Stage IIIB (pT4, pN0, cM0, Mitotic Rate: High) - Signed by Alla Feeling, NP on 04/22/2018     Malignant gastrointestinal stromal tumor (GIST) of stomach ([place])  02/16/2018 Procedure   Upper EGD per Dr. [name] Impression:  - Normal upper third of esophagus and middle third of esophagus. - 5 cm hiatal hernia. - Widely patent Schatzki ring. - Clotted blood in the gastric body. - Rule out malignancy, gastric tumor on the lesser curvature of the stomach. Injected. Clips were placed. - Normal examined duodenum. - No specimens collected.   02/17/2018 Imaging   CT AP IMPRESSION: 1. 9.7 x 8.8 x 10.9 cm aggressive appearing heterogeneously enhancing mass along the lesser curvature of the stomach, highly concerning for primary gastric neoplasm. This is intimately associated with the undersurface of the left lobe of the liver without definitive evidence of direct invasion (although superficial involvement of the hepatic capsule is suspected given the loss of the intervening fat plane). There is also an indeterminate left adrenal nodule measuring 2.0 x 1.4 cm, which could be metastatic. No other definite signs of metastatic disease elsewhere in the abdomen or pelvis. 2. Areas of cylindrical bronchiectasis and chronic scarring in the lower lobes of the lungs bilaterally. 3. Aortic atherosclerosis, in  addition to left main and 3 vessel coronary artery disease. 4. Right kidney is either surgically or congenitally absent, or severely atrophic. 5. Additional incidental findings, as above.   02/19/2018 Initial Biopsy   VIA CT BIOPSY: Diagnosis -  Soft Tissue Needle Core Biopsy, gastric - FINDINGS CONSISTENT WITH GASTROINTESTINAL STROMAL TUMOR (GIST). - SEE COMMENT.   02/19/2018 Pathology Results   Diagnosis 02/19/2018 Soft Tissue Needle Core Biopsy, gastric - FINDINGS CONSISTENT WITH GASTROINTESTINAL STROMAL TUMOR (GIST).   03/12/2018 Initial Diagnosis   Malignant gastrointestinal stromal tumor (GIST) of stomach ([place])   03/12/2018 Pathology Results   Surgical path: Diagnosis Stomach, resection for tumor, Lesser curvature - GASTROINTESTINAL STROMAL TUMOR, 13.8 CM.   03/18/2018 Imaging   CT CAP IMPRESSION: -obtained d/t post op fever, leukocytosis, and respiratory change   Moderate right pleural effusion and small left pleural effusion. Compressive atelectasis versus pneumonia in the lower lobes. Nodular airspace disease throughout much of the right upper lobe and right middle lobe concerning for pneumonia.  Cardiomegaly. Severe/diffuse coronary artery disease. Aortic atherosclerosis. No evidence of aortic aneurysm.  Postoperative changes from recent partial gastrectomy. Small amount of free air in the abdomen, likely related to post op state. No evidence of bowel obstruction.  Prior remote right nephrectomy and prostatectomy.   03/22/2018 Procedure   Thoracentesis  Diagnosis PLEURAL FLUID, RIGHT (SPECIMEN 1 OF 1, COLLECTED ON 03/22/2018): NO MALIGNANT CELLS IDENTIFIED.     04/22/2018 Cancer Staging   Staging form: Gastrointestinal Stromal Tumor - Gastric and Omental GIST, AJCC 8th Edition - Pathologic stage from 04/22/2018: Stage IIIB (pT4, pN0, cM0, Mitotic Rate: High) - Signed by Alla Feeling, NP on 04/22/2018   05/03/2018 Imaging  05/03/2018 CT Abdomen and Pelvis W/  Contrast IMPRESSION: CTA chest:  1. No pulmonary embolus. 2. Debris within the right lower lobe bronchus with associated right lower lobe opacity, this can be seen with aspiration. 3. Improved right upper lobe aeration from prior consistent with resolving pneumonia. Near completely resolved right pleural effusion. 4. Chronic bilateral lower lobe bronchiectasis. Bilateral lower lobe airspace disease with some improvement from most recent prior. 5. Coronary artery calcifications. Aortic Atherosclerosis (ICD10-I70.0).  CT abdomen/pelvis:  1. Large colonic stool burden, suggesting constipation. No bowel obstruction. 2. Post recent partial gastrectomy without evident complication. No intra-abdominal abscess. 3. Additional stable chronic findings as described. Aortic Atherosclerosis (ICD10-I70.0).   05/03/2018 Imaging   05/03/2018 CT Angio Chest PE W or WO Contrast IMPRESSION: CTA chest:  1. No pulmonary embolus. 2. Debris within the right lower lobe bronchus with associated right lower lobe opacity, this can be seen with aspiration. 3. Improved right upper lobe aeration from prior consistent with resolving pneumonia. Near completely resolved right pleural effusion. 4. Chronic bilateral lower lobe bronchiectasis. Bilateral lower lobe airspace disease with some improvement from most recent prior. 5. Coronary artery calcifications. Aortic Atherosclerosis (ICD10-I70.0).   05/03/2018 Imaging   05/03/2018 CT CAP IMPRESSION: CTA chest: 1. No pulmonary embolus. 2. Debris within the right lower lobe bronchus with associated right lower lobe opacity, this can be seen with aspiration. 3. Improved right upper lobe aeration from prior consistent with resolving pneumonia. Near completely resolved right pleural effusion. 4. Chronic bilateral lower lobe bronchiectasis. Bilateral lower lobe airspace disease with some improvement from most recent prior. 5. Coronary artery calcifications.  Aortic Atherosclerosis (ICD10-I70.0).  CT abdomen/pelvis: 1. Large colonic stool burden, suggesting constipation. No bowel obstruction. 2. Post recent partial gastrectomy without evident complication. No intra-abdominal abscess. 3. Additional stable chronic findings as described. Aortic Atherosclerosis (ICD10-I70.0).    05/26/2018 - 05/31/2020 Chemotherapy   Adjuvant Gleevec, starting on 05/26/18 at $RemoveBe'200mg'HDpPtPrYw$  daily and gradually increased to $RemoveBefo'400mg'NaMyJstZWNe$  daily since 06/23/2018. Complete end of June 2021   11/19/2018 Imaging   CT CAP  IMPRESSION: 1. Stable exam. No new or progressive interval findings. No features to suggest metastatic disease in the chest, abdomen, or pelvis. 2. Bronchiectasis/scarring in the lung bases, similar to prior. 3. Stable small left adrenal nodule. 4.  Aortic Atherosclerois (ICD10-170.0)   04/14/2019 Imaging   IMPRESSION: Status post partial gastrectomy. No evidence of recurrent or metastatic disease.   Status post cholecystectomy. Status post right nephrectomy. Status post prostatectomy with pelvic lymph node dissection.   Additional stable ancillary findings as above.   04/06/2020 Imaging   CT CAP W contrast  IMPRESSION: Chest Impression:   1. No evidence of thoracic metastasis. 2. Coronary artery calcification and aortic atherosclerotic calcification. 3. Stable bronchiectasis at the lung bases.   Abdomen / Pelvis Impression:   1. No evidence of gastrointestinal stromal tumor recurrence. Partial gastrectomy anatomy. 2. No metastatic disease in the abdomen pelvis. 3. No bowel obstruction. 4. Post RIGHT nephrectomy.      CURRENT THERAPY:  Surveillance   INTERVAL HISTORY:  Casey Gates. is here for a follow up of GIST. He presents to the clinic with his son. He notes he has little appetite but makes himself eat. He also uses Boost which helps him maintain his weight. He notes his pain is occasional now. He notes his BM is still constipated. He  will constantly use enema He denies blood in stool and denies nausea. He ambulates with walker and uses wheelchair.  REVIEW OF SYSTEMS:   Constitutional: Denies fevers, chills or abnormal weight loss Eyes: Denies blurriness of vision Ears, nose, mouth, throat, and face: Denies mucositis or sore throat Respiratory: Denies cough, dyspnea or wheezes Cardiovascular: Denies palpitation, chest discomfort or lower extremity swelling Gastrointestinal:  Denies nausea, heartburn or change in bowel habits Skin: Denies abnormal skin rashes Lymphatics: Denies new lymphadenopathy or easy bruising Neurological:Denies numbness, tingling or new weaknesses Behavioral/Psych: Mood is stable, no new changes  All other systems were reviewed with the patient and are negative.  MEDICAL HISTORY:  Past Medical History:  Diagnosis Date  . Anemia   . Asthma    AS A CHILD  . Atrial fibrillation (HCC)   . Blind left eye   . Cancer [facility])    PROSTATE CANCER  . Cellulitis    frequently, "all over"  . Chronic kidney disease   . Chronic pain syndrome   . Chronic ulcer of toe, right, with unspecified severity (HCC)    2nd toe  . Constipation   . DDD (degenerative disc disease), lumbar   . Gastrointestinal stromal tumor (GIST) of stomach (H[facility])   . GI bleeding   . History of blood transfusion    01/2018  . Insomnia   . Kyphoscoliosis   . Mass in the abdomen 01/2018  . Restless leg   . Sleep apnea    does not wear CPAP  . Wears glasses     SURGICAL HISTORY: Past Surgical History:  Procedure Laterality Date  . AMPUTATION TOE Right 11/05/2018   Procedure: Right 2nd toe ampuation;  Surgeon: Toni Arthurs, MD;  Location: [facility];  Service: Orthopedics;  Laterality: Right;   . CHOLECYSTECTOMY    . COLONOSCOPY    . ESOPHAGOGASTRODUODENOSCOPY (EGD) WITH PROPOFOL Left 02/16/2018   Procedure: ESOPHAGOGASTRODUODENOSCOPY (EGD) WITH PROPOFOL;  Surgeon: Kerin Salen, MD;  Location: [facility]  ENDOSCOPY;  Service: Gastroenterology;  Laterality: Left;  . LAPAROSCOPIC GASTRECTOMY N/A 03/12/2018   Procedure: LAPAROSCOPIC ASSISTED PARTIAL GASTRECTOMY ERAS PATHWAY;  Surgeon: Almond Lint, MD;  Location: [facility] OR;  Service: General;  Laterality: N/A;  . LAPAROSCOPIC PARTIAL GASTRECTOMY  03/12/2018   LAPAROSCOPIC ASSISTED PARTIAL GASTRECTOMY ERAS PATHWAY  . PROSTATECTOMY    . QUADRICEPS TENDON REPAIR Left 07/07/2017   Procedure: REPAIR QUADRICEP TENDON;  Surgeon: Samson Frederic, MD;  Location: [facility] OR;  Service: Orthopedics;  Laterality: Left;  . RIGHT CATARACT EXTRACTION  2013  . RIGTH NEPHRECTOMY  2006  . TONSILLECTOMY    . TRANSURETHRAL RESECTION OF PROSTATE  1997    I have reviewed the social history and family history with the patient and they are unchanged from previous note.  ALLERGIES:  is allergic to coconut flavor [flavoring agent], coconut oil, doxycycline hyclate, food, black walnut flavor, coconut oil, and doxycycline.  MEDICATIONS:  Current Outpatient Medications  Medication Sig Dispense Refill  . apixaban (ELIQUIS) 2.5 MG TABS tablet Take 1 tablet (2.5 mg total) by mouth 2 (two) times daily. 60 tablet 3  . diltiazem (CARDIZEM SR) 120 MG 12 hr capsule Take 1 capsule (120 mg total) by mouth 2 (two) times daily. 60 capsule 5  . diltiazem (CARDIZEM SR) 120 MG 12 hr capsule Take 120 mg by mouth in the morning and at bedtime.    Marland Kitchen ELIQUIS 2.5 MG TABS tablet Take 2.5 mg by mouth 2 (two) times daily.    Marland Kitchen gabapentin (NEURONTIN) 300 MG capsule Take 1 capsule (300 mg total) by mouth 3 (three) times daily. (Patient taking differently:  Take 300 mg by mouth as needed. )    . gabapentin (NEURONTIN) 300 MG capsule Take 300 mg by mouth daily as needed. Restless leg    . mirtazapine (REMERON) 15 MG tablet     . MOVANTIK 25 MG TABS tablet Take 1 tablet by mouth daily.    Marland Kitchen MOVANTIK 25 MG TABS tablet Take 25 mg by mouth daily.    Marland Kitchen nystatin-triamcinolone (MYCOLOG II) cream Apply topically 2  (two) times daily.    Marland Kitchen oxymorphone (OPANA) 10 MG tablet Take 10 mg by mouth every 6 (six) hours as needed for pain.     Marland Kitchen oxymorphone (OPANA) 10 MG tablet Take 10 mg by mouth every 6 (six) hours as needed for pain.    Vladimir Faster Glycol-Propyl Glycol (SYSTANE) 0.4-0.3 % GEL ophthalmic gel Place 1 application into both eyes 2 (two) times daily.    Marland Kitchen Propylene Glycol (SYSTANE BALANCE) 0.6 % SOLN Place 1 drop into both eyes daily as needed (dry eyes).    . triamcinolone ointment (KENALOG) 0.1 % Apply topically 2 (two) times daily.     No current facility-administered medications for this visit.    PHYSICAL EXAMINATION: ECOG PERFORMANCE STATUS: 3 - Symptomatic, >50% confined to bed  Vitals:   10/13/20 0846  BP: 115/66  Pulse: 94  Resp: 18  Temp: (!) 97.5 F (36.4 C)  SpO2: 97%   Filed Weights   10/13/20 0846  Weight: 129 lb 9.6 oz (58.8 kg)    GENERAL:alert, no distress and comfortable SKIN: skin color, texture, turgor are normal, no rashes or significant lesions EYES: normal, Conjunctiva are pink and non-injected, sclera clear  NECK: supple, thyroid normal size, non-tender, without nodularity LYMPH:  no palpable lymphadenopathy in the cervical, axillary  LUNGS: clear to auscultation and percussion with normal breathing effort HEART: regular rate & rhythm and no murmurs and no lower extremity edema ABDOMEN:abdomen soft, non-tender and normal bowel sounds. NO hepatomegaly  Musculoskeletal:no cyanosis of digits and no clubbing  NEURO: alert & oriented x 3 with fluent speech, no focal motor/sensory deficits  LABORATORY DATA:  I have reviewed the data as listed CBC Latest Ref Rng & Units 10/13/2020 09/01/2020 08/24/2020  WBC 4.0 - 10.5 K/uL 7.4 9.1 7.3  Hemoglobin 13.0 - 17.0 g/dL 13.0 13.4 13.3  Hematocrit 39 - 52 % 41.6 43.5 42.8  Platelets 150 - 400 K/uL 148(L) 148(L) 148(L)     CMP Latest Ref Rng & Units 10/13/2020 09/01/2020 07/27/2020  Glucose 70 - 99 mg/dL 102(H) 88  102(H)  BUN 8 - 23 mg/dL 20 24(H) 22  Creatinine 0.61 - 1.24 mg/dL 0.93 0.98 1.08  Sodium 135 - 145 mmol/L 143 143 139  Potassium 3.5 - 5.1 mmol/L 3.7 4.0 4.4  Chloride 98 - 111 mmol/L 107 106 102  CO2 22 - 32 mmol/L $RemoveB'30 29 27  'PazVtAbK$ Calcium 8.9 - 10.3 mg/dL 8.9 9.4 9.2  Total Protein 6.5 - 8.1 g/dL 6.2(L) 6.3(L) 6.7  Total Bilirubin 0.3 - 1.2 mg/dL 0.6 0.4 0.6  Alkaline Phos 38 - 126 U/L 78 67 69  AST 15 - 41 U/L 33 39 37  ALT 0 - 44 U/L 22 35 26      RADIOGRAPHIC STUDIES: I have personally reviewed the radiological images as listed and agreed with the findings in the report. No results found.   ASSESSMENT & PLAN:  Casey Gates. is a 86 y.o. male with    1. Gastrointestinal Stromal Tumor (GIST) of the lesser  curvature of the stomach, 13.8 cm, pT4pNX, G2, mitotic rate >20/50 high power field, stage IIIB -He was diagnosed in 01/2018. He is s/p tumor resection.Pathology revealed a large 13.8 cm GIST in the stomach. IHC was performed and the tumor is positive with CD117 (C-Kit). Genomic testing showed Kit Exon 11 mutation/insertion, which predicts benefitfromGleevec. -He completed adjuvantGleevec for2years (instead of standard 3 years due to his advanced age). It was held in Sep 2020 due to thrombocytopenia. He completed in the end of 05/2020.  -He is clinically doing well. Labs reviewed, CBC and CMP WNL except plt 148K. Physical exam benign.  -Continue surveillance. At his age I do not plan to repeat scan too often, will repeat one in May 2022 -F/u in 4 months with a repeated CT AP    2.Hematochezia,resolved.No recurrence after returning to fulldoseGleevec.   3. Anemia, iron deficient -Hehas been treated with blood transfusionin the past -Currently onoral iron therapy 1 tab daily beginning in 04/2018, continue daily.Stableand mild.  4. Chronic constipation -Related to chronicopioiduse withoxymorphonemanaged Dr. Nelva Bush -He does not tolerate Magnesium  Citrate, Miralaxand senna, Lynzest andcolase does not work for him. -He uses enemas and OralMovantikwith mild relief. He still may not have BM for up to 3 days. Stable.   5.Low appetite, Depression -Hepreviouslystopped Mirtazapine due to lack of benefit. I recommended he try again.  -He remains to have low appetite, but continues to eat and uses boost once daily. He is able to maintain weight lately.   6. S/p right 2nd toe amputation. He ambulates with walker and uses wheelchair   7. Chronic Back pain -He has been on long term oxymorphone as needed for hischronicback pain, which is the main factor limiting his activities.He uses it TID now, no less than 5 hours a part. -His pain is now occasional   8. Atrial Fibrillation  -He was recently found to have Afib. He plans to see [facility] this week and may be started on Eliquis. I discussed this can increase risk of bleeding and urged him to avoid fall or injury.  -Stable.    PLAN: - F/u in 4 months with lab and CT AP w contrast a few days before     No problem-specific Assessment & Plan notes found for this encounter.   Orders Placed This Encounter  Procedures  . CT Abdomen Pelvis W Contrast    Standing Status:   Future    Standing Expiration Date:   10/13/2021    Order Specific Question:   If indicated for the ordered procedure, I authorize the administration of contrast media per Radiology protocol    Answer:   Yes    Order Specific Question:   Preferred imaging location?    Answer:   [name]    Order Specific Question:   Release to patient    Answer:   Immediate    Order Specific Question:   Is Oral Contrast requested for this exam?    Answer:   Yes, Per Radiology protocol   All questions were answered. The patient knows to call the clinic with any problems, questions or concerns. No barriers to learning was detected. The total time spent in the appointment was 30 minutes.     Truitt Merle,  MD 10/13/2020   I, Joslyn Devon, am acting as scribe for Truitt Merle, MD.   I have reviewed the above documentation for accuracy and completeness, and I agree with the above.

## 2020-10-12 DIAGNOSIS — M6281 Muscle weakness (generalized): Secondary | ICD-10-CM | POA: Diagnosis not present

## 2020-10-12 DIAGNOSIS — R2681 Unsteadiness on feet: Secondary | ICD-10-CM | POA: Diagnosis not present

## 2020-10-13 ENCOUNTER — Inpatient Hospital Stay: Payer: Medicare PPO | Attending: Hematology

## 2020-10-13 ENCOUNTER — Other Ambulatory Visit: Payer: Self-pay

## 2020-10-13 ENCOUNTER — Encounter: Payer: Self-pay | Admitting: Hematology

## 2020-10-13 ENCOUNTER — Inpatient Hospital Stay (HOSPITAL_BASED_OUTPATIENT_CLINIC_OR_DEPARTMENT_OTHER): Payer: Medicare PPO | Admitting: Hematology

## 2020-10-13 VITALS — BP 115/66 | HR 94 | Temp 97.5°F | Resp 18 | Ht 72.0 in | Wt 129.6 lb

## 2020-10-13 DIAGNOSIS — I4891 Unspecified atrial fibrillation: Secondary | ICD-10-CM | POA: Diagnosis not present

## 2020-10-13 DIAGNOSIS — D696 Thrombocytopenia, unspecified: Secondary | ICD-10-CM | POA: Insufficient documentation

## 2020-10-13 DIAGNOSIS — D509 Iron deficiency anemia, unspecified: Secondary | ICD-10-CM | POA: Diagnosis not present

## 2020-10-13 DIAGNOSIS — K5909 Other constipation: Secondary | ICD-10-CM | POA: Diagnosis not present

## 2020-10-13 DIAGNOSIS — Z85028 Personal history of other malignant neoplasm of stomach: Secondary | ICD-10-CM | POA: Insufficient documentation

## 2020-10-13 DIAGNOSIS — C49A2 Gastrointestinal stromal tumor of stomach: Secondary | ICD-10-CM | POA: Diagnosis not present

## 2020-10-13 DIAGNOSIS — G894 Chronic pain syndrome: Secondary | ICD-10-CM | POA: Diagnosis not present

## 2020-10-13 DIAGNOSIS — Z1159 Encounter for screening for other viral diseases: Secondary | ICD-10-CM | POA: Diagnosis not present

## 2020-10-13 DIAGNOSIS — K449 Diaphragmatic hernia without obstruction or gangrene: Secondary | ICD-10-CM | POA: Insufficient documentation

## 2020-10-13 DIAGNOSIS — F329 Major depressive disorder, single episode, unspecified: Secondary | ICD-10-CM | POA: Diagnosis not present

## 2020-10-13 DIAGNOSIS — Z20828 Contact with and (suspected) exposure to other viral communicable diseases: Secondary | ICD-10-CM | POA: Diagnosis not present

## 2020-10-13 DIAGNOSIS — K921 Melena: Secondary | ICD-10-CM | POA: Diagnosis not present

## 2020-10-13 DIAGNOSIS — M6281 Muscle weakness (generalized): Secondary | ICD-10-CM | POA: Diagnosis not present

## 2020-10-13 LAB — CMP (CANCER CENTER ONLY)
ALT: 22 U/L (ref 0–44)
AST: 33 U/L (ref 15–41)
Albumin: 3.6 g/dL (ref 3.5–5.0)
Alkaline Phosphatase: 78 U/L (ref 38–126)
Anion gap: 6 (ref 5–15)
BUN: 20 mg/dL (ref 8–23)
CO2: 30 mmol/L (ref 22–32)
Calcium: 8.9 mg/dL (ref 8.9–10.3)
Chloride: 107 mmol/L (ref 98–111)
Creatinine: 0.93 mg/dL (ref 0.61–1.24)
GFR, Estimated: >60 mL/min (ref >60)
Glucose, Bld: 102 mg/dL — ABNORMAL HIGH (ref 70–99)
Potassium: 3.7 mmol/L (ref 3.5–5.1)
Sodium: 143 mmol/L (ref 135–145)
Total Bilirubin: 0.6 mg/dL (ref 0.3–1.2)
Total Protein: 6.2 g/dL — ABNORMAL LOW (ref 6.5–8.1)

## 2020-10-13 LAB — CBC WITH DIFFERENTIAL (CANCER CENTER ONLY)
Abs Immature Granulocytes: 0.03 10*3/uL (ref 0.00–0.07)
Basophils Absolute: 0 10*3/uL (ref 0.0–0.1)
Basophils Relative: 1 %
Eosinophils Absolute: 0.5 10*3/uL (ref 0.0–0.5)
Eosinophils Relative: 6 %
HCT: 41.6 % (ref 39.0–52.0)
Hemoglobin: 13 g/dL (ref 13.0–17.0)
Immature Granulocytes: 0 %
Lymphocytes Relative: 27 %
Lymphs Abs: 2 10*3/uL (ref 0.7–4.0)
MCH: 28.1 pg (ref 26.0–34.0)
MCHC: 31.3 g/dL (ref 30.0–36.0)
MCV: 90 fL (ref 80.0–100.0)
Monocytes Absolute: 0.7 10*3/uL (ref 0.1–1.0)
Monocytes Relative: 10 %
Neutro Abs: 4.2 10*3/uL (ref 1.7–7.7)
Neutrophils Relative %: 56 %
Platelet Count: 148 10*3/uL — ABNORMAL LOW (ref 150–400)
RBC: 4.62 MIL/uL (ref 4.22–5.81)
RDW: 15.1 % (ref 11.5–15.5)
WBC Count: 7.4 10*3/uL (ref 4.0–10.5)
nRBC: 0 % (ref 0.0–0.2)

## 2020-10-13 LAB — FERRITIN: Ferritin: 42 ng/mL (ref 24–336)

## 2020-10-16 DIAGNOSIS — M6281 Muscle weakness (generalized): Secondary | ICD-10-CM | POA: Diagnosis not present

## 2020-10-17 DIAGNOSIS — Z1159 Encounter for screening for other viral diseases: Secondary | ICD-10-CM | POA: Diagnosis not present

## 2020-10-17 DIAGNOSIS — M6281 Muscle weakness (generalized): Secondary | ICD-10-CM | POA: Diagnosis not present

## 2020-10-17 DIAGNOSIS — Z20828 Contact with and (suspected) exposure to other viral communicable diseases: Secondary | ICD-10-CM | POA: Diagnosis not present

## 2020-10-18 DIAGNOSIS — M6281 Muscle weakness (generalized): Secondary | ICD-10-CM | POA: Diagnosis not present

## 2020-10-20 DIAGNOSIS — M6281 Muscle weakness (generalized): Secondary | ICD-10-CM | POA: Diagnosis not present

## 2020-10-20 DIAGNOSIS — Z20828 Contact with and (suspected) exposure to other viral communicable diseases: Secondary | ICD-10-CM | POA: Diagnosis not present

## 2020-10-20 DIAGNOSIS — Z1159 Encounter for screening for other viral diseases: Secondary | ICD-10-CM | POA: Diagnosis not present

## 2020-10-22 ENCOUNTER — Other Ambulatory Visit (HOSPITAL_COMMUNITY): Payer: Self-pay | Admitting: Nurse Practitioner

## 2020-10-23 DIAGNOSIS — R2681 Unsteadiness on feet: Secondary | ICD-10-CM | POA: Diagnosis not present

## 2020-10-23 DIAGNOSIS — M6281 Muscle weakness (generalized): Secondary | ICD-10-CM | POA: Diagnosis not present

## 2020-10-24 DIAGNOSIS — Z20828 Contact with and (suspected) exposure to other viral communicable diseases: Secondary | ICD-10-CM | POA: Diagnosis not present

## 2020-10-24 DIAGNOSIS — Z1159 Encounter for screening for other viral diseases: Secondary | ICD-10-CM | POA: Diagnosis not present

## 2020-10-24 DIAGNOSIS — R2681 Unsteadiness on feet: Secondary | ICD-10-CM | POA: Diagnosis not present

## 2020-10-24 DIAGNOSIS — M6281 Muscle weakness (generalized): Secondary | ICD-10-CM | POA: Diagnosis not present

## 2020-10-25 DIAGNOSIS — M6281 Muscle weakness (generalized): Secondary | ICD-10-CM | POA: Diagnosis not present

## 2020-10-28 DIAGNOSIS — R2681 Unsteadiness on feet: Secondary | ICD-10-CM | POA: Diagnosis not present

## 2020-10-28 DIAGNOSIS — M6281 Muscle weakness (generalized): Secondary | ICD-10-CM | POA: Diagnosis not present

## 2020-10-31 DIAGNOSIS — Z20828 Contact with and (suspected) exposure to other viral communicable diseases: Secondary | ICD-10-CM | POA: Diagnosis not present

## 2020-10-31 DIAGNOSIS — M6281 Muscle weakness (generalized): Secondary | ICD-10-CM | POA: Diagnosis not present

## 2020-10-31 DIAGNOSIS — Z1159 Encounter for screening for other viral diseases: Secondary | ICD-10-CM | POA: Diagnosis not present

## 2020-11-01 DIAGNOSIS — R2681 Unsteadiness on feet: Secondary | ICD-10-CM | POA: Diagnosis not present

## 2020-11-01 DIAGNOSIS — M6281 Muscle weakness (generalized): Secondary | ICD-10-CM | POA: Diagnosis not present

## 2020-11-02 DIAGNOSIS — M6281 Muscle weakness (generalized): Secondary | ICD-10-CM | POA: Diagnosis not present

## 2020-11-03 DIAGNOSIS — R2681 Unsteadiness on feet: Secondary | ICD-10-CM | POA: Diagnosis not present

## 2020-11-03 DIAGNOSIS — Z1159 Encounter for screening for other viral diseases: Secondary | ICD-10-CM | POA: Diagnosis not present

## 2020-11-03 DIAGNOSIS — M6281 Muscle weakness (generalized): Secondary | ICD-10-CM | POA: Diagnosis not present

## 2020-11-03 DIAGNOSIS — Z20828 Contact with and (suspected) exposure to other viral communicable diseases: Secondary | ICD-10-CM | POA: Diagnosis not present

## 2020-11-06 DIAGNOSIS — M6281 Muscle weakness (generalized): Secondary | ICD-10-CM | POA: Diagnosis not present

## 2020-11-06 DIAGNOSIS — R2681 Unsteadiness on feet: Secondary | ICD-10-CM | POA: Diagnosis not present

## 2020-11-07 DIAGNOSIS — M6281 Muscle weakness (generalized): Secondary | ICD-10-CM | POA: Diagnosis not present

## 2020-11-07 DIAGNOSIS — Z1159 Encounter for screening for other viral diseases: Secondary | ICD-10-CM | POA: Diagnosis not present

## 2020-11-07 DIAGNOSIS — Z20828 Contact with and (suspected) exposure to other viral communicable diseases: Secondary | ICD-10-CM | POA: Diagnosis not present

## 2020-11-07 DIAGNOSIS — R2681 Unsteadiness on feet: Secondary | ICD-10-CM | POA: Diagnosis not present

## 2020-11-09 DIAGNOSIS — R2681 Unsteadiness on feet: Secondary | ICD-10-CM | POA: Diagnosis not present

## 2020-11-09 DIAGNOSIS — M6281 Muscle weakness (generalized): Secondary | ICD-10-CM | POA: Diagnosis not present

## 2020-11-10 DIAGNOSIS — Z20828 Contact with and (suspected) exposure to other viral communicable diseases: Secondary | ICD-10-CM | POA: Diagnosis not present

## 2020-11-10 DIAGNOSIS — Z1159 Encounter for screening for other viral diseases: Secondary | ICD-10-CM | POA: Diagnosis not present

## 2020-11-13 DIAGNOSIS — R2681 Unsteadiness on feet: Secondary | ICD-10-CM | POA: Diagnosis not present

## 2020-11-13 DIAGNOSIS — M6281 Muscle weakness (generalized): Secondary | ICD-10-CM | POA: Diagnosis not present

## 2020-11-14 DIAGNOSIS — Z1159 Encounter for screening for other viral diseases: Secondary | ICD-10-CM | POA: Diagnosis not present

## 2020-11-14 DIAGNOSIS — Z20828 Contact with and (suspected) exposure to other viral communicable diseases: Secondary | ICD-10-CM | POA: Diagnosis not present

## 2020-11-16 DIAGNOSIS — M810 Age-related osteoporosis without current pathological fracture: Secondary | ICD-10-CM | POA: Diagnosis not present

## 2020-11-16 DIAGNOSIS — H9190 Unspecified hearing loss, unspecified ear: Secondary | ICD-10-CM | POA: Diagnosis not present

## 2020-11-16 DIAGNOSIS — M6281 Muscle weakness (generalized): Secondary | ICD-10-CM | POA: Diagnosis not present

## 2020-11-16 DIAGNOSIS — F330 Major depressive disorder, recurrent, mild: Secondary | ICD-10-CM | POA: Diagnosis not present

## 2020-11-16 DIAGNOSIS — G894 Chronic pain syndrome: Secondary | ICD-10-CM | POA: Diagnosis not present

## 2020-11-16 DIAGNOSIS — K5909 Other constipation: Secondary | ICD-10-CM | POA: Diagnosis not present

## 2020-11-16 DIAGNOSIS — I4891 Unspecified atrial fibrillation: Secondary | ICD-10-CM | POA: Diagnosis not present

## 2020-11-16 DIAGNOSIS — R2681 Unsteadiness on feet: Secondary | ICD-10-CM | POA: Diagnosis not present

## 2020-11-16 DIAGNOSIS — D692 Other nonthrombocytopenic purpura: Secondary | ICD-10-CM | POA: Diagnosis not present

## 2020-11-16 DIAGNOSIS — D6869 Other thrombophilia: Secondary | ICD-10-CM | POA: Diagnosis not present

## 2020-11-17 DIAGNOSIS — Z20828 Contact with and (suspected) exposure to other viral communicable diseases: Secondary | ICD-10-CM | POA: Diagnosis not present

## 2020-11-17 DIAGNOSIS — Z1159 Encounter for screening for other viral diseases: Secondary | ICD-10-CM | POA: Diagnosis not present

## 2020-11-18 DIAGNOSIS — R2681 Unsteadiness on feet: Secondary | ICD-10-CM | POA: Diagnosis not present

## 2020-11-18 DIAGNOSIS — M6281 Muscle weakness (generalized): Secondary | ICD-10-CM | POA: Diagnosis not present

## 2020-11-21 DIAGNOSIS — M6281 Muscle weakness (generalized): Secondary | ICD-10-CM | POA: Diagnosis not present

## 2020-11-21 DIAGNOSIS — Z1159 Encounter for screening for other viral diseases: Secondary | ICD-10-CM | POA: Diagnosis not present

## 2020-11-21 DIAGNOSIS — Z20828 Contact with and (suspected) exposure to other viral communicable diseases: Secondary | ICD-10-CM | POA: Diagnosis not present

## 2020-11-21 DIAGNOSIS — R2681 Unsteadiness on feet: Secondary | ICD-10-CM | POA: Diagnosis not present

## 2020-11-23 DIAGNOSIS — Z1159 Encounter for screening for other viral diseases: Secondary | ICD-10-CM | POA: Diagnosis not present

## 2020-11-23 DIAGNOSIS — Z20828 Contact with and (suspected) exposure to other viral communicable diseases: Secondary | ICD-10-CM | POA: Diagnosis not present

## 2020-11-27 ENCOUNTER — Ambulatory Visit (HOSPITAL_COMMUNITY): Payer: Medicare PPO | Admitting: Nurse Practitioner

## 2020-11-28 DIAGNOSIS — Z1159 Encounter for screening for other viral diseases: Secondary | ICD-10-CM | POA: Diagnosis not present

## 2020-11-28 DIAGNOSIS — Z20828 Contact with and (suspected) exposure to other viral communicable diseases: Secondary | ICD-10-CM | POA: Diagnosis not present

## 2020-12-05 DIAGNOSIS — R2681 Unsteadiness on feet: Secondary | ICD-10-CM | POA: Diagnosis not present

## 2020-12-05 DIAGNOSIS — M6281 Muscle weakness (generalized): Secondary | ICD-10-CM | POA: Diagnosis not present

## 2020-12-06 DIAGNOSIS — R2681 Unsteadiness on feet: Secondary | ICD-10-CM | POA: Diagnosis not present

## 2020-12-06 DIAGNOSIS — M6281 Muscle weakness (generalized): Secondary | ICD-10-CM | POA: Diagnosis not present

## 2020-12-07 DIAGNOSIS — R2681 Unsteadiness on feet: Secondary | ICD-10-CM | POA: Diagnosis not present

## 2020-12-07 DIAGNOSIS — M6281 Muscle weakness (generalized): Secondary | ICD-10-CM | POA: Diagnosis not present

## 2020-12-08 DIAGNOSIS — Z20828 Contact with and (suspected) exposure to other viral communicable diseases: Secondary | ICD-10-CM | POA: Diagnosis not present

## 2020-12-08 DIAGNOSIS — Z1159 Encounter for screening for other viral diseases: Secondary | ICD-10-CM | POA: Diagnosis not present

## 2020-12-14 DIAGNOSIS — Z20828 Contact with and (suspected) exposure to other viral communicable diseases: Secondary | ICD-10-CM | POA: Diagnosis not present

## 2020-12-16 DIAGNOSIS — R2681 Unsteadiness on feet: Secondary | ICD-10-CM | POA: Diagnosis not present

## 2020-12-16 DIAGNOSIS — M6281 Muscle weakness (generalized): Secondary | ICD-10-CM | POA: Diagnosis not present

## 2020-12-17 ENCOUNTER — Encounter (HOSPITAL_COMMUNITY): Payer: Self-pay | Admitting: Obstetrics and Gynecology

## 2020-12-17 ENCOUNTER — Other Ambulatory Visit: Payer: Self-pay

## 2020-12-17 ENCOUNTER — Inpatient Hospital Stay (HOSPITAL_COMMUNITY)
Admission: EM | Admit: 2020-12-17 | Discharge: 2021-01-10 | DRG: 871 | Disposition: A | Discharge status: Skilled Nursing Facility | Payer: Medicare PPO | Source: Skilled Nursing Facility | Attending: Internal Medicine | Admitting: Internal Medicine

## 2020-12-17 ENCOUNTER — Inpatient Hospital Stay (HOSPITAL_COMMUNITY): Payer: Medicare PPO

## 2020-12-17 ENCOUNTER — Emergency Department (HOSPITAL_COMMUNITY): Payer: Medicare PPO

## 2020-12-17 DIAGNOSIS — J90 Pleural effusion, not elsewhere classified: Secondary | ICD-10-CM

## 2020-12-17 DIAGNOSIS — E876 Hypokalemia: Secondary | ICD-10-CM | POA: Diagnosis not present

## 2020-12-17 DIAGNOSIS — E870 Hyperosmolality and hypernatremia: Secondary | ICD-10-CM

## 2020-12-17 DIAGNOSIS — Z8249 Family history of ischemic heart disease and other diseases of the circulatory system: Secondary | ICD-10-CM

## 2020-12-17 DIAGNOSIS — I5043 Acute on chronic combined systolic (congestive) and diastolic (congestive) heart failure: Secondary | ICD-10-CM | POA: Diagnosis not present

## 2020-12-17 DIAGNOSIS — G9341 Metabolic encephalopathy: Secondary | ICD-10-CM | POA: Diagnosis not present

## 2020-12-17 DIAGNOSIS — H5462 Unqualified visual loss, left eye, normal vision right eye: Secondary | ICD-10-CM | POA: Diagnosis present

## 2020-12-17 DIAGNOSIS — E43 Unspecified severe protein-calorie malnutrition: Secondary | ICD-10-CM | POA: Diagnosis present

## 2020-12-17 DIAGNOSIS — R918 Other nonspecific abnormal finding of lung field: Secondary | ICD-10-CM | POA: Diagnosis not present

## 2020-12-17 DIAGNOSIS — A419 Sepsis, unspecified organism: Principal | ICD-10-CM | POA: Diagnosis present

## 2020-12-17 DIAGNOSIS — T797XXA Traumatic subcutaneous emphysema, initial encounter: Secondary | ICD-10-CM | POA: Diagnosis not present

## 2020-12-17 DIAGNOSIS — Z8261 Family history of arthritis: Secondary | ICD-10-CM

## 2020-12-17 DIAGNOSIS — E440 Moderate protein-calorie malnutrition: Secondary | ICD-10-CM | POA: Diagnosis present

## 2020-12-17 DIAGNOSIS — Z91018 Allergy to other foods: Secondary | ICD-10-CM

## 2020-12-17 DIAGNOSIS — M419 Scoliosis, unspecified: Secondary | ICD-10-CM | POA: Diagnosis present

## 2020-12-17 DIAGNOSIS — R0902 Hypoxemia: Secondary | ICD-10-CM | POA: Diagnosis not present

## 2020-12-17 DIAGNOSIS — J189 Pneumonia, unspecified organism: Secondary | ICD-10-CM | POA: Diagnosis not present

## 2020-12-17 DIAGNOSIS — I2510 Atherosclerotic heart disease of native coronary artery without angina pectoris: Secondary | ICD-10-CM | POA: Diagnosis not present

## 2020-12-17 DIAGNOSIS — I4819 Other persistent atrial fibrillation: Secondary | ICD-10-CM | POA: Diagnosis not present

## 2020-12-17 DIAGNOSIS — I452 Bifascicular block: Secondary | ICD-10-CM | POA: Diagnosis present

## 2020-12-17 DIAGNOSIS — Z89421 Acquired absence of other right toe(s): Secondary | ICD-10-CM

## 2020-12-17 DIAGNOSIS — J9690 Respiratory failure, unspecified, unspecified whether with hypoxia or hypercapnia: Secondary | ICD-10-CM | POA: Diagnosis not present

## 2020-12-17 DIAGNOSIS — Z20822 Contact with and (suspected) exposure to covid-19: Secondary | ICD-10-CM | POA: Diagnosis not present

## 2020-12-17 DIAGNOSIS — Z7189 Other specified counseling: Secondary | ICD-10-CM | POA: Diagnosis not present

## 2020-12-17 DIAGNOSIS — N189 Chronic kidney disease, unspecified: Secondary | ICD-10-CM | POA: Diagnosis present

## 2020-12-17 DIAGNOSIS — J9601 Acute respiratory failure with hypoxia: Secondary | ICD-10-CM | POA: Diagnosis not present

## 2020-12-17 DIAGNOSIS — I712 Thoracic aortic aneurysm, without rupture: Secondary | ICD-10-CM | POA: Diagnosis not present

## 2020-12-17 DIAGNOSIS — Y95 Nosocomial condition: Secondary | ICD-10-CM | POA: Diagnosis present

## 2020-12-17 DIAGNOSIS — R6521 Severe sepsis with septic shock: Secondary | ICD-10-CM | POA: Diagnosis not present

## 2020-12-17 DIAGNOSIS — Z808 Family history of malignant neoplasm of other organs or systems: Secondary | ICD-10-CM

## 2020-12-17 DIAGNOSIS — Z903 Acquired absence of stomach [part of]: Secondary | ICD-10-CM

## 2020-12-17 DIAGNOSIS — J984 Other disorders of lung: Secondary | ICD-10-CM | POA: Diagnosis not present

## 2020-12-17 DIAGNOSIS — R0602 Shortness of breath: Secondary | ICD-10-CM | POA: Diagnosis not present

## 2020-12-17 DIAGNOSIS — J9811 Atelectasis: Secondary | ICD-10-CM | POA: Diagnosis not present

## 2020-12-17 DIAGNOSIS — M2550 Pain in unspecified joint: Secondary | ICD-10-CM | POA: Diagnosis not present

## 2020-12-17 DIAGNOSIS — Z6821 Body mass index (BMI) 21.0-21.9, adult: Secondary | ICD-10-CM | POA: Diagnosis not present

## 2020-12-17 DIAGNOSIS — J181 Lobar pneumonia, unspecified organism: Secondary | ICD-10-CM | POA: Diagnosis not present

## 2020-12-17 DIAGNOSIS — J470 Bronchiectasis with acute lower respiratory infection: Secondary | ICD-10-CM | POA: Diagnosis present

## 2020-12-17 DIAGNOSIS — I4821 Permanent atrial fibrillation: Secondary | ICD-10-CM | POA: Diagnosis present

## 2020-12-17 DIAGNOSIS — Z66 Do not resuscitate: Secondary | ICD-10-CM | POA: Diagnosis present

## 2020-12-17 DIAGNOSIS — D6489 Other specified anemias: Secondary | ICD-10-CM | POA: Diagnosis present

## 2020-12-17 DIAGNOSIS — I4891 Unspecified atrial fibrillation: Secondary | ICD-10-CM | POA: Diagnosis not present

## 2020-12-17 DIAGNOSIS — J869 Pyothorax without fistula: Secondary | ICD-10-CM | POA: Diagnosis not present

## 2020-12-17 DIAGNOSIS — J918 Pleural effusion in other conditions classified elsewhere: Secondary | ICD-10-CM | POA: Diagnosis not present

## 2020-12-17 DIAGNOSIS — Z9049 Acquired absence of other specified parts of digestive tract: Secondary | ICD-10-CM

## 2020-12-17 DIAGNOSIS — R54 Age-related physical debility: Secondary | ICD-10-CM | POA: Diagnosis present

## 2020-12-17 DIAGNOSIS — Z881 Allergy status to other antibiotic agents status: Secondary | ICD-10-CM

## 2020-12-17 DIAGNOSIS — Z832 Family history of diseases of the blood and blood-forming organs and certain disorders involving the immune mechanism: Secondary | ICD-10-CM

## 2020-12-17 DIAGNOSIS — R091 Pleurisy: Secondary | ICD-10-CM | POA: Diagnosis not present

## 2020-12-17 DIAGNOSIS — R570 Cardiogenic shock: Secondary | ICD-10-CM | POA: Diagnosis not present

## 2020-12-17 DIAGNOSIS — Z8500 Personal history of malignant neoplasm of unspecified digestive organ: Secondary | ICD-10-CM

## 2020-12-17 DIAGNOSIS — Z978 Presence of other specified devices: Secondary | ICD-10-CM | POA: Diagnosis not present

## 2020-12-17 DIAGNOSIS — Z85831 Personal history of malignant neoplasm of soft tissue: Secondary | ICD-10-CM

## 2020-12-17 DIAGNOSIS — C61 Malignant neoplasm of prostate: Secondary | ICD-10-CM | POA: Diagnosis present

## 2020-12-17 DIAGNOSIS — R4781 Slurred speech: Secondary | ICD-10-CM | POA: Diagnosis present

## 2020-12-17 DIAGNOSIS — J9382 Other air leak: Secondary | ICD-10-CM | POA: Diagnosis not present

## 2020-12-17 DIAGNOSIS — R339 Retention of urine, unspecified: Secondary | ICD-10-CM | POA: Diagnosis present

## 2020-12-17 DIAGNOSIS — R1310 Dysphagia, unspecified: Secondary | ICD-10-CM | POA: Diagnosis present

## 2020-12-17 DIAGNOSIS — R579 Shock, unspecified: Secondary | ICD-10-CM | POA: Diagnosis not present

## 2020-12-17 DIAGNOSIS — I5033 Acute on chronic diastolic (congestive) heart failure: Secondary | ICD-10-CM | POA: Diagnosis not present

## 2020-12-17 DIAGNOSIS — Z9889 Other specified postprocedural states: Secondary | ICD-10-CM | POA: Diagnosis not present

## 2020-12-17 DIAGNOSIS — Z8546 Personal history of malignant neoplasm of prostate: Secondary | ICD-10-CM

## 2020-12-17 DIAGNOSIS — H9190 Unspecified hearing loss, unspecified ear: Secondary | ICD-10-CM | POA: Diagnosis present

## 2020-12-17 DIAGNOSIS — R4182 Altered mental status, unspecified: Secondary | ICD-10-CM | POA: Diagnosis not present

## 2020-12-17 DIAGNOSIS — D649 Anemia, unspecified: Secondary | ICD-10-CM | POA: Diagnosis not present

## 2020-12-17 DIAGNOSIS — I700 Atherosclerosis of aorta: Secondary | ICD-10-CM | POA: Diagnosis present

## 2020-12-17 DIAGNOSIS — I517 Cardiomegaly: Secondary | ICD-10-CM | POA: Diagnosis not present

## 2020-12-17 DIAGNOSIS — Z7401 Bed confinement status: Secondary | ICD-10-CM | POA: Diagnosis not present

## 2020-12-17 DIAGNOSIS — K5900 Constipation, unspecified: Secondary | ICD-10-CM | POA: Diagnosis present

## 2020-12-17 DIAGNOSIS — J95811 Postprocedural pneumothorax: Secondary | ICD-10-CM | POA: Diagnosis not present

## 2020-12-17 DIAGNOSIS — G2581 Restless legs syndrome: Secondary | ICD-10-CM | POA: Diagnosis present

## 2020-12-17 DIAGNOSIS — Z7901 Long term (current) use of anticoagulants: Secondary | ICD-10-CM

## 2020-12-17 DIAGNOSIS — J690 Pneumonitis due to inhalation of food and vomit: Secondary | ICD-10-CM | POA: Diagnosis not present

## 2020-12-17 DIAGNOSIS — G4733 Obstructive sleep apnea (adult) (pediatric): Secondary | ICD-10-CM | POA: Diagnosis present

## 2020-12-17 DIAGNOSIS — R911 Solitary pulmonary nodule: Secondary | ICD-10-CM | POA: Diagnosis not present

## 2020-12-17 DIAGNOSIS — T8182XA Emphysema (subcutaneous) resulting from a procedure, initial encounter: Secondary | ICD-10-CM | POA: Diagnosis not present

## 2020-12-17 DIAGNOSIS — M5136 Other intervertebral disc degeneration, lumbar region: Secondary | ICD-10-CM | POA: Diagnosis present

## 2020-12-17 DIAGNOSIS — Z515 Encounter for palliative care: Secondary | ICD-10-CM | POA: Diagnosis not present

## 2020-12-17 DIAGNOSIS — Z905 Acquired absence of kidney: Secondary | ICD-10-CM

## 2020-12-17 DIAGNOSIS — G8929 Other chronic pain: Secondary | ICD-10-CM | POA: Diagnosis present

## 2020-12-17 DIAGNOSIS — Z9841 Cataract extraction status, right eye: Secondary | ICD-10-CM

## 2020-12-17 DIAGNOSIS — Z9079 Acquired absence of other genital organ(s): Secondary | ICD-10-CM

## 2020-12-17 HISTORY — DX: Other persistent atrial fibrillation: I48.19

## 2020-12-17 LAB — CBC WITH DIFFERENTIAL/PLATELET
Abs Immature Granulocytes: 0.16 10*3/uL — ABNORMAL HIGH (ref 0.00–0.07)
Basophils Absolute: 0 10*3/uL (ref 0.0–0.1)
Basophils Relative: 0 %
Eosinophils Absolute: 0 10*3/uL (ref 0.0–0.5)
Eosinophils Relative: 0 %
HCT: 42.6 % (ref 39.0–52.0)
Hemoglobin: 13.1 g/dL (ref 13.0–17.0)
Immature Granulocytes: 1 %
Lymphocytes Relative: 4 %
Lymphs Abs: 0.9 10*3/uL (ref 0.7–4.0)
MCH: 28.4 pg (ref 26.0–34.0)
MCHC: 30.8 g/dL (ref 30.0–36.0)
MCV: 92.4 fL (ref 80.0–100.0)
Monocytes Absolute: 1.7 10*3/uL — ABNORMAL HIGH (ref 0.1–1.0)
Monocytes Relative: 8 %
Neutro Abs: 20.1 10*3/uL — ABNORMAL HIGH (ref 1.7–7.7)
Neutrophils Relative %: 87 %
Platelets: 262 10*3/uL (ref 150–400)
RBC: 4.61 MIL/uL (ref 4.22–5.81)
RDW: 14.6 % (ref 11.5–15.5)
WBC: 22.9 10*3/uL — ABNORMAL HIGH (ref 4.0–10.5)
nRBC: 0 % (ref 0.0–0.2)

## 2020-12-17 LAB — URINALYSIS, ROUTINE W REFLEX MICROSCOPIC
Bacteria, UA: NONE SEEN
Bilirubin Urine: NEGATIVE
Glucose, UA: NEGATIVE mg/dL
Ketones, ur: NEGATIVE mg/dL
Leukocytes,Ua: NEGATIVE
Nitrite: NEGATIVE
Protein, ur: 30 mg/dL — AB
Specific Gravity, Urine: 1.025 (ref 1.005–1.030)
pH: 5 (ref 5.0–8.0)

## 2020-12-17 LAB — COMPREHENSIVE METABOLIC PANEL
ALT: 33 U/L (ref 0–44)
AST: 54 U/L — ABNORMAL HIGH (ref 15–41)
Albumin: 3.1 g/dL — ABNORMAL LOW (ref 3.5–5.0)
Alkaline Phosphatase: 81 U/L (ref 38–126)
Anion gap: 6 (ref 5–15)
BUN: 43 mg/dL — ABNORMAL HIGH (ref 8–23)
CO2: 33 mmol/L — ABNORMAL HIGH (ref 22–32)
Calcium: 9.5 mg/dL (ref 8.9–10.3)
Chloride: 108 mmol/L (ref 98–111)
Creatinine, Ser: 1.13 mg/dL (ref 0.61–1.24)
GFR, Estimated: >60 mL/min (ref >60)
Glucose, Bld: 145 mg/dL — ABNORMAL HIGH (ref 70–99)
Potassium: 4 mmol/L (ref 3.5–5.1)
Sodium: 147 mmol/L — ABNORMAL HIGH (ref 135–145)
Total Bilirubin: 0.8 mg/dL (ref 0.3–1.2)
Total Protein: 7 g/dL (ref 6.5–8.1)

## 2020-12-17 LAB — SARS CORONAVIRUS 2 (TAT 6-24 HRS): SARS Coronavirus 2: NEGATIVE

## 2020-12-17 LAB — LACTIC ACID, PLASMA
Lactic Acid, Venous: 1.6 mmol/L (ref 0.5–1.9)
Lactic Acid, Venous: 1.7 mmol/L (ref 0.5–1.9)

## 2020-12-17 MED ORDER — MORPHINE SULFATE 15 MG PO TABS
30.0000 mg | ORAL_TABLET | Freq: Four times a day (QID) | ORAL | Status: DC | PRN
  Administered 2020-12-20 (×2): 30 mg via ORAL
  Filled 2020-12-17 (×2): qty 2

## 2020-12-17 MED ORDER — APIXABAN 2.5 MG PO TABS
2.5000 mg | ORAL_TABLET | Freq: Two times a day (BID) | ORAL | Status: DC
  Administered 2020-12-17 – 2020-12-25 (×15): 2.5 mg via ORAL
  Filled 2020-12-17 (×17): qty 1

## 2020-12-17 MED ORDER — ONDANSETRON HCL 4 MG/2ML IJ SOLN: 4.0000 mg | Freq: Four times a day (QID) | INTRAMUSCULAR | Status: DC | PRN

## 2020-12-17 MED ORDER — IPRATROPIUM-ALBUTEROL 0.5-2.5 (3) MG/3ML IN SOLN: 3.0000 mL | RESPIRATORY_TRACT | Status: DC | PRN

## 2020-12-17 MED ORDER — SODIUM CHLORIDE 0.9 % IV SOLN
1.0000 g | Freq: Once | INTRAVENOUS | Status: AC
  Administered 2020-12-17: 1 g via INTRAVENOUS
  Filled 2020-12-17: qty 10

## 2020-12-17 MED ORDER — SODIUM CHLORIDE 0.9 % IV BOLUS
1000.0000 mL | Freq: Once | INTRAVENOUS | Status: AC
  Administered 2020-12-17: 1000 mL via INTRAVENOUS

## 2020-12-17 MED ORDER — ARTIFICIAL TEARS OPHTHALMIC OINT
1.0000 "application " | TOPICAL_OINTMENT | Freq: Three times a day (TID) | OPHTHALMIC | Status: DC
  Administered 2020-12-17 – 2021-01-10 (×60): 1 via OPHTHALMIC
  Filled 2020-12-17 (×3): qty 3.5

## 2020-12-17 MED ORDER — ACETAMINOPHEN 650 MG RE SUPP
650.0000 mg | Freq: Four times a day (QID) | RECTAL | Status: DC | PRN
  Administered 2020-12-28: 650 mg via RECTAL
  Filled 2020-12-17: qty 1

## 2020-12-17 MED ORDER — IOHEXOL 300 MG/ML  SOLN
75.0000 mL | Freq: Once | INTRAMUSCULAR | Status: AC | PRN
  Administered 2020-12-17: 75 mL via INTRAVENOUS

## 2020-12-17 MED ORDER — NALOXEGOL OXALATE 25 MG PO TABS
25.0000 mg | ORAL_TABLET | Freq: Every day | ORAL | Status: DC
  Administered 2020-12-18 – 2020-12-25 (×6): 25 mg via ORAL
  Filled 2020-12-17 (×9): qty 1

## 2020-12-17 MED ORDER — SODIUM CHLORIDE 0.9 % IV SOLN
500.0000 mg | Freq: Once | INTRAVENOUS | Status: AC
  Administered 2020-12-17: 500 mg via INTRAVENOUS
  Filled 2020-12-17: qty 500

## 2020-12-17 MED ORDER — ONDANSETRON HCL 4 MG PO TABS: 4.0000 mg | ORAL_TABLET | Freq: Four times a day (QID) | ORAL | Status: DC | PRN

## 2020-12-17 MED ORDER — MIRTAZAPINE 15 MG PO TABS
30.0000 mg | ORAL_TABLET | Freq: Every day | ORAL | Status: DC
  Administered 2020-12-17 – 2020-12-27 (×11): 30 mg via ORAL
  Filled 2020-12-17 (×2): qty 2
  Filled 2020-12-17: qty 1
  Filled 2020-12-17 (×3): qty 2
  Filled 2020-12-17: qty 1
  Filled 2020-12-17 (×4): qty 2

## 2020-12-17 MED ORDER — ACETAMINOPHEN 325 MG PO TABS
650.0000 mg | ORAL_TABLET | Freq: Four times a day (QID) | ORAL | Status: DC | PRN
  Administered 2020-12-19 – 2020-12-28 (×9): 650 mg via ORAL
  Filled 2020-12-17 (×11): qty 2

## 2020-12-17 MED ORDER — DILTIAZEM LOAD VIA INFUSION
5.0000 mg | Freq: Once | INTRAVENOUS | Status: AC
  Administered 2020-12-17: 5 mg via INTRAVENOUS
  Filled 2020-12-17: qty 5

## 2020-12-17 MED ORDER — GABAPENTIN 300 MG PO CAPS
300.0000 mg | ORAL_CAPSULE | Freq: Three times a day (TID) | ORAL | Status: DC | PRN
Start: 2020-12-17 — End: 2020-12-28
  Administered 2020-12-20 – 2020-12-27 (×6): 300 mg via ORAL
  Filled 2020-12-17 (×7): qty 1

## 2020-12-17 MED ORDER — DILTIAZEM HCL-DEXTROSE 125-5 MG/125ML-% IV SOLN (PREMIX)
5.0000 mg/h | INTRAVENOUS | Status: DC
  Administered 2020-12-17: 5 mg/h via INTRAVENOUS
  Administered 2020-12-18 – 2020-12-21 (×8): 15 mg/h via INTRAVENOUS
  Administered 2020-12-21 – 2020-12-22 (×4): 10 mg/h via INTRAVENOUS
  Administered 2020-12-23: 15 mg/h via INTRAVENOUS
  Filled 2020-12-17 (×14): qty 125

## 2020-12-17 NOTE — ED Provider Notes (Signed)
[facility] DEPT Provider Note   CSN: [id number] Arrival date & time: 12/17/20  0940     History Chief Complaint  Patient presents with  . Urinary Tract Infection    Smith Mattia. is a 87 y.o. male.  HPI Patient is here by private vehicle, with his son who gives history.  For 3 days patient has had increasing confusion and his caregivers were going to get a urinalysis but this has not been done yet.  Son reports patient has been eating less than usual but is apparently taking his medications as prescribed.  Patient's son states he was more confused yesterday and today is a little more lucid and alert.  Patient continues to be able to ambulate with his walker, which is his usual.  He lives in an assisted living situation, his own apartment.  The patient had a COVID infection, last month, but apparently has been released from isolation for that.  Level 5 caveat-altered mental status    Past Medical History:  Diagnosis Date  . Anemia   . Asthma    AS A CHILD  . Atrial fibrillation ([place])   . Blind left eye   . Cancer [facility])    PROSTATE CANCER  . Cellulitis    frequently, "all over"  . Chronic kidney disease   . Chronic pain syndrome   . Chronic ulcer of toe, right, with unspecified severity ([place])    2nd toe  . Constipation   . DDD (degenerative disc disease), lumbar   . Gastrointestinal stromal tumor (GIST) of stomach ([place])   . GI bleeding   . History of blood transfusion    01/2018  . Insomnia   . Kyphoscoliosis   . Mass in the abdomen 01/2018  . Restless leg   . Sleep apnea    does not wear CPAP  . Wears glasses     Patient Active Problem List   Diagnosis Date Noted  . Atrial fibrillation with RVR ([place]) 12/17/2020  . Acute metabolic encephalopathy 123456  . Hypernatremia 12/17/2020  . Coagulation defect ([place]) 09/27/2020  . Back pain, chronic 12/14/2018  . Edema 12/14/2018  . Prostate cancer ([place]) 12/14/2018  . History of  amputation of lesser toe ([place]) 11/16/2018  . Chronic ulcer of right foot with fat layer exposed ([place]) 11/05/2018  . Contusion of right hip region 07/24/2018  . Multifocal pneumonia 03/18/2018  . Sepsis ([place]) 03/18/2018  . Malignant gastrointestinal stromal tumor (GIST) of stomach ([place]) 03/12/2018  . Urinary retention 02/24/2018  . Gastrointestinal stromal tumor (GIST) of stomach ([place])   . Restless leg syndrome   . Acute GI bleeding 02/16/2018  . Acute blood loss anemia 02/16/2018  . Chronic low back pain 02/05/2018  . Degeneration of lumbar intervertebral disc 02/05/2018  . Constipation 01/20/2018  . Exertional shortness of breath 12/30/2017  . Rupture of left quadriceps muscle 07/07/2017  . Rupture of left quadriceps tendon 06/26/2017  . Eczema 06/26/2017  . Chest pain 07/29/2015  . OSA on CPAP 07/10/2015  . CPAP use counseling 07/10/2015  . Excessive daytime sleepiness 02/22/2015  . Sleep apnea 02/22/2015  . Snoring 02/22/2015  . Nocturia more than twice per night 02/22/2015  . Syncope 12/31/2012  . Bifascicular bundle branch block 12/31/2012    Class: Acute  . Cellulitis and abscess of leg, except foot 12/04/2012  . Chronic pain 12/04/2012  . Kyphosis 12/04/2012  . H/O unilateral nephrectomy 12/04/2012  . H/O prostate cancer 12/04/2012  . S/P  TURP 12/04/2012    Past Surgical History:  Procedure Laterality Date  . AMPUTATION TOE Right 11/05/2018   Procedure: Right 2nd toe ampuation;  Surgeon: Wylene Simmer, MD;  Location: [facility];  Service: Orthopedics;  Laterality: Right;  59min  . CHOLECYSTECTOMY    . COLONOSCOPY    . ESOPHAGOGASTRODUODENOSCOPY (EGD) WITH PROPOFOL Left 02/16/2018   Procedure: ESOPHAGOGASTRODUODENOSCOPY (EGD) WITH PROPOFOL;  Surgeon: [name], MD;  Location: [facility];  Service: Gastroenterology;  Laterality: Left;  . LAPAROSCOPIC GASTRECTOMY N/A 03/12/2018   Procedure: LAPAROSCOPIC ASSISTED PARTIAL GASTRECTOMY ERAS PATHWAY;   Surgeon: Stark Klein, MD;  Location: [facility];  Service: General;  Laterality: N/A;  . LAPAROSCOPIC PARTIAL GASTRECTOMY  03/12/2018   LAPAROSCOPIC ASSISTED PARTIAL GASTRECTOMY ERAS PATHWAY  . PROSTATECTOMY    . QUADRICEPS TENDON REPAIR Left 07/07/2017   Procedure: REPAIR QUADRICEP TENDON;  Surgeon: Rod Can, MD;  Location: [facility];  Service: Orthopedics;  Laterality: Left;  . RIGHT CATARACT EXTRACTION  2013  . RIGTH NEPHRECTOMY  2006  . TONSILLECTOMY    . TRANSURETHRAL RESECTION OF PROSTATE  1997       Family History  Problem Relation Age of Onset  . Bone cancer Mother   . Hypertension Mother   . Arthritis Mother   . Clotting disorder Daughter        died of blood clot     Social History   Tobacco Use  . Smoking status: Never Smoker  . Smokeless tobacco: Never Used  Vaping Use  . Vaping Use: Never used  Substance Use Topics  . Alcohol use: Not Currently    Comment: quit 1985, 2 fifths/ week,S/P rehab  . Drug use: Never    Home Medications Prior to Admission medications   Medication Sig Start Date End Date Taking? Authorizing Provider  apixaban (ELIQUIS) 2.5 MG TABS tablet Take 1 tablet (2.5 mg total) by mouth 2 (two) times daily. 07/24/20   Sherran Needs, NP  diltiazem (CARDIZEM SR) 120 MG 12 hr capsule Take 1 capsule (120 mg total) by mouth 2 (two) times daily. 07/24/20 08/24/20  Sherran Needs, NP  diltiazem (CARDIZEM SR) 120 MG 12 hr capsule Take 120 mg by mouth in the morning and at bedtime. 08/22/20   [provider]  ELIQUIS 2.5 MG TABS tablet TAKE 1 TABLET(2.5 MG) BY MOUTH TWICE DAILY 10/23/20   Sherran Needs, NP  gabapentin (NEURONTIN) 300 MG capsule Take 1 capsule (300 mg total) by mouth 3 (three) times daily. Patient taking differently: Take 300 mg by mouth as needed.  03/24/18   Katherine Roan, MD  gabapentin (NEURONTIN) 300 MG capsule Take 300 mg by mouth daily as needed. Restless leg 05/14/20   [provider]  mirtazapine (REMERON)  15 MG tablet  07/22/20   [provider]  mirtazapine (REMERON) 30 MG tablet Take 30 mg by mouth at bedtime. 11/21/20   [provider]  MOVANTIK 25 MG TABS tablet Take 1 tablet by mouth daily. 09/10/19   [provider]  MOVANTIK 25 MG TABS tablet Take 25 mg by mouth daily. 06/27/20   [provider]  nystatin-triamcinolone (MYCOLOG II) cream Apply topically 2 (two) times daily. 05/31/20   [provider]  oxymorphone (OPANA) 10 MG tablet Take 10 mg by mouth every 6 (six) hours as needed for pain.  07/03/18   [provider]  oxymorphone (OPANA) 10 MG tablet Take 10 mg by mouth every 6 (six) hours as needed for  pain. 08/23/20   [provider]  Polyethyl Glycol-Propyl Glycol (SYSTANE) 0.4-0.3 % GEL ophthalmic gel Place 1 application into both eyes 2 (two) times daily.    [provider]  Propylene Glycol (SYSTANE BALANCE) 0.6 % SOLN Place 1 drop into both eyes daily as needed (dry eyes).    [provider]  triamcinolone ointment (KENALOG) 0.1 % Apply topically 2 (two) times daily. 08/03/20   [provider]    Allergies    Coconut flavor [flavoring agent], Coconut oil, Doxycycline hyclate, Food, Black walnut flavor, Coconut oil, and Doxycycline  Review of Systems   Review of Systems  Unable to perform ROS: Mental status change    Physical Exam Updated Vital Signs BP 107/84 (BP Location: Left Arm)   Pulse (!) 126   Temp 99 F (37.2 C) (Rectal)   Resp 19   SpO2 92%   Physical Exam Vitals and nursing note reviewed.  Constitutional:      General: He is not in acute distress.    Appearance: He is well-developed and well-nourished. He is ill-appearing. He is not toxic-appearing or diaphoretic.     Comments: Elderly, frail  HENT:     Head: Normocephalic and atraumatic.     Right Ear: External ear normal.     Left Ear: External ear normal.     Mouth/Throat:     Mouth: Mucous membranes are dry.      Pharynx: No oropharyngeal exudate or posterior oropharyngeal erythema.  Eyes:     Extraocular Movements: EOM normal.     Conjunctiva/sclera: Conjunctivae normal.     Pupils: Pupils are equal, round, and reactive to light.  Neck:     Trachea: Phonation normal.  Cardiovascular:     Rate and Rhythm: Tachycardia present. Rhythm irregular.     Heart sounds: Normal heart sounds.  Pulmonary:     Effort: Pulmonary effort is normal. No respiratory distress.     Breath sounds: Normal breath sounds. No stridor.  Chest:     Chest wall: No bony tenderness.  Abdominal:     Palpations: Abdomen is soft.     Tenderness: There is no abdominal tenderness.  Musculoskeletal:        General: Normal range of motion.     Cervical back: Normal range of motion and neck supple.     Right lower leg: No edema.     Left lower leg: No edema.  Skin:    General: Skin is warm, dry and intact.  Neurological:     Mental Status: He is alert.     Cranial Nerves: No cranial nerve deficit.     Motor: No abnormal muscle tone.     Coordination: Coordination normal.  Psychiatric:        Mood and Affect: Mood and affect and mood normal.        Behavior: Behavior normal.     ED Results / Procedures / Treatments   Labs (all labs ordered are listed, but only abnormal results are displayed) Labs Reviewed  COMPREHENSIVE METABOLIC PANEL - Abnormal; Notable for the following components:      Result Value   Sodium 147 (*)    CO2 33 (*)    Glucose, Bld 145 (*)    BUN 43 (*)    Albumin 3.1 (*)    AST 54 (*)    All other components within normal limits  CBC WITH DIFFERENTIAL/PLATELET - Abnormal; Notable for the following components:   WBC 22.9 (*)  Neutro Abs 20.1 (*)    Monocytes Absolute 1.7 (*)    Abs Immature Granulocytes 0.16 (*)    All other components within normal limits  URINALYSIS, ROUTINE W REFLEX MICROSCOPIC - Abnormal; Notable for the following components:   APPearance HAZY (*)    Hgb urine dipstick  SMALL (*)    Protein, ur 30 (*)    All other components within normal limits  CULTURE, BLOOD (ROUTINE X 2)  CULTURE, BLOOD (ROUTINE X 2)  SARS CORONAVIRUS 2 (TAT 6-24 HRS)  LACTIC ACID, PLASMA  LACTIC ACID, PLASMA    EKG EKG Interpretation  Date/Time:  Sunday December 17 2020 10:07:27 EST Ventricular Rate:  131 PR Interval:    QRS Duration: 140 QT Interval:  333 QTC Calculation: 492 R Axis:   -82 Text Interpretation: Atrial fibrillation RBBB and LAFB Baseline wander Since last tracing rate faster Otherwise no significant change Confirmed by Daleen Bo [phone]) on 12/17/2020 10:59:02 AM   Radiology DG Chest Port 1 View  Result Date: 12/17/2020 CLINICAL DATA:  87 year old male with altered mental status. Confusion. History of gastrointestinal cancer stated on prior CT Chest, Abdomen, and Pelvis - most recently 04/06/2020. EXAM: PORTABLE CHEST 1 VIEW COMPARISON:  Portable chest 09/11/2020 and earlier, including chest CT 04/26/2020. FINDINGS: Portable AP semi upright view at 1141 hours. Moderate veiling opacity in the right lower lung is new since October. Obscured right hemidiaphragm. Lung volumes appear stable. Mediastinal contours appear stable. Allowing for portable technique the left lung is clear. No pneumothorax or pulmonary edema. Visualized tracheal air column is within normal limits. Stable surgical clips in the right upper quadrant. Chronic right lateral rib fractures appear stable. IMPRESSION: 1. Moderate size new veiling opacity in the right lower lung. This could be a moderate size right pleural effusion with atelectasis, or a combination of pleural fluid with pneumonia or new lung mass (history of GI stromal tumor). Chest CT (IV contrast preferred) would best characterize further. 2. Left lung remains clear. Electronically Signed   By: Genevie Ann M.D.   On: 12/17/2020 11:59    Procedures .Critical Care Performed by: Daleen Bo, MD Authorized by: Daleen Bo, MD    Critical care provider statement:    Critical care time (minutes):  45   Critical care start time:  12/17/2020 10:05 AM   Critical care end time:  12/17/2020 2:51 PM   Critical care time was exclusive of:  Separately billable procedures and treating other patients   Critical care was necessary to treat or prevent imminent or life-threatening deterioration of the following conditions:  Cardiac failure   Critical care was time spent personally by me on the following activities:  Blood draw for specimens, development of treatment plan with patient or surrogate, discussions with consultants, evaluation of patient's response to treatment, examination of patient, obtaining history from patient or surrogate, ordering and performing treatments and interventions, ordering and review of laboratory studies, pulse oximetry, re-evaluation of patient's condition, review of old charts and ordering and review of radiographic studies   (including critical care time)  Medications Ordered in ED Medications  diltiazem (CARDIZEM) 1 mg/mL load via infusion 5 mg (has no administration in time range)    And  diltiazem (CARDIZEM) 125 mg in dextrose 5% 125 mL (1 mg/mL) infusion (has no administration in time range)  azithromycin (ZITHROMAX) 500 mg in sodium chloride 0.9 % 250 mL IVPB (has no administration in time range)  sodium chloride 0.9 % bolus 1,000 mL (0 mLs Intravenous Stopped  12/17/20 1305)  cefTRIAXone (ROCEPHIN) 1 g in sodium chloride 0.9 % 100 mL IVPB (0 g Intravenous Stopped 12/17/20 1305)  sodium chloride 0.9 % bolus 1,000 mL (0 mLs Intravenous Stopped 12/17/20 1504)    ED Course  I have reviewed the triage vital signs and the nursing notes.  Pertinent labs & imaging results that were available during my care of the patient were reviewed by me and considered in my medical decision making (see chart for details).  Clinical Course as of 12/17/20 1508  Nancy Fetter Dec 17, 2020  1020 Initiated sepsis protocol,  will cover with Rocephin for possible UTI.  Infection suspected at this time however initial temperature is normal.  Patient may be simply dehydrated, accounting for tachycardia.  Expectant treatment planned. [EW]  1307 Persistent tachycardia now about 120, irregular.  Rectal temperature normal, will order additional IV fluid bolus.  Anticipate starting diltiazem drip soon. [EW]    Clinical Course User Index [EW] Daleen Bo, MD   MDM Rules/Calculators/A&P                           Patient Vitals for the past 24 hrs:  BP Temp Temp src Pulse Resp SpO2  12/17/20 1202 107/84 99 F (37.2 C) Rectal (!) 126 19 92 %  12/17/20 1200 120/78 - - (!) 103 (!) 21 (!) 89 %  12/17/20 1145 107/68 - - (!) 125 20 92 %  12/17/20 0951 103/76 99.3 F (37.4 C) Oral (!) 139 20 90 %     Medical Decision Making:  This patient is presenting for evaluation of confusion and dehydration, which does require a range of treatment options, and is a complaint that involves a high risk of morbidity and mortality. The differential diagnoses include acute infection, metabolic disorder, dehydration. I decided to review old records, and in summary elderly male, living in assisted living with altered mental status, and symptoms similar to when he previously had a urinary tract infection.  I obtained additional historical information from son at the bedside.  Clinical Laboratory Tests Ordered, included lactate CBC, Metabolic panel, Urinalysis and Lactate, blood cultures, COVID test. Review indicates sodium high, CO2 high, glucose high, BUN high, albumin low, AST high, urinalysis catheter sample, increased blood, protein present, white count high. Radiologic Tests Ordered, included chest x-ray.  I independently Visualized: Radiologic images, which show right lung opacity, nonspecific, differential includes recurrent cancer, pneumonia or pleural effusion  Cardiac Monitor Tracing which shows atrial fibrillation with rapid  ventricular rate    Critical Interventions-clinical evaluation, empiric treatment with antibiotics, laboratory testing, chest x-ray, observation, CT chest ordered, with IV contrast, to evaluate right lung mass.  Diltiazem started for rate control for atrial fibrillation with rapid ventricular response.  Care of atrial fibrillation and is currently on anticoagulant medication  After These Interventions, the Patient was reevaluated and was found with persistent tachycardia, Lung mass, and elevated white count.  Suspected UTI, but urinalysis does not indicate infection.  Added Zithromax, to Rocephin for possible community-acquired pneumonia.  Rectal temperature normal.  Patient will require hospitalization for stabilization.  CRITICAL CARE-yes Performed by: Daleen Bo   Nursing Notes Reviewed/ Care Coordinated Applicable Imaging Reviewed Interpretation of Laboratory Data incorporated into ED treatment'  3:00 PM-Consult complete with hospitalist. Patient case explained and discussed.  He agrees to admit patient for further evaluation and treatment. Call ended at 3:08 PM    Final Clinical Impression(s) / ED Diagnoses Final diagnoses:  Atrial fibrillation  with RVR ([place])  Lung mass    Rx / DC Orders ED Discharge Orders    None       Daleen Bo, MD 12/17/20 [phone]

## 2020-12-17 NOTE — ED Notes (Signed)
In and Out Cath was performed by tech Georgeanne Nim) and assisted by the RN nurse [facility]). Sent pt. Urine and urine culture to the lab. Nurse aware.

## 2020-12-17 NOTE — ED Triage Notes (Signed)
Patient reports to the ER for possible UTI or infection. Patient reports he is confused and felt like he was "falling while in bed" Patient is in Afib.

## 2020-12-17 NOTE — H&P (Signed)
History and Physical  Patient Name: Casey Gates.     GUR:[id number]    DOB: Jan 19, 1926    DOA: 12/17/2020 PCP: Velna Hatchet, MD  Patient coming from: Nursing facility  Chief Complaint: Altered mental status    HPI: Casey Gates. is a 87 y.o. male with hx history of A. fib, CKD, chronic pain, OSA (does not use BiPAP) who presented to the hospital 12/17/2020 by son for altered mental status.  At the time exam, patient is confused, most difficult to obtain complete and thorough HPI review of systems from patient.  50 of history obtained from patient's son bedside.  The patient's son states that he was called by the patient's nursing facility on Thursday that he was altered.  However the patient's son was being quarantined with COVID and could not see him until today, 12/17/2020.  Nursing facility with concern for UTI however they did not do any further testing.  When the son saw the patient, he definitely thought he was off from his baseline.  Thus he brought the patient in for evaluation.  The son denies any recent changes in meds.  He typically gets around with a walker but has been weaker.  He did have COVID in the end of December however was fairly asymptomatic.  He also has not been eating as well.  ED course: -Initial presentation, afebrile, heart rate 138, blood pressure 103/76, respiratory rate 20. -Relevant labs on initial admission: Sodium 147, potassium 4, chloride 108, bicarb 33, glucose 245, BUN 43, creatinine 1.13, lactic acid 1.6, WBC 22.9, hemoglobin 13.1 -Chest x-ray showed new right lower lung vague opacity - EKG showed A. fib with RVR -Patient was given fluid, azithromycin, Rocephin, placed on a Cardizem drip and the hospitalist service was contacted for further evaluation management.     ROS: Unable to fully obtain due to patient's status      Past Medical History:  Diagnosis Date  . Anemia   . Asthma    AS A CHILD  . Atrial fibrillation ([place])    . Blind left eye   . Cancer [facility])    PROSTATE CANCER  . Cellulitis    frequently, "all over"  . Chronic kidney disease   . Chronic pain syndrome   . Chronic ulcer of toe, right, with unspecified severity ([place])    2nd toe  . Constipation   . DDD (degenerative disc disease), lumbar   . Gastrointestinal stromal tumor (GIST) of stomach ([place])   . GI bleeding   . History of blood transfusion    01/2018  . Insomnia   . Kyphoscoliosis   . Mass in the abdomen 01/2018  . Restless leg   . Sleep apnea    does not wear CPAP  . Wears glasses     Past Surgical History:  Procedure Laterality Date  . AMPUTATION TOE Right 11/05/2018   Procedure: Right 2nd toe ampuation;  Surgeon: Wylene Simmer, MD;  Location: [place];  Service: Orthopedics;  Laterality: Right;  47min  . CHOLECYSTECTOMY    . COLONOSCOPY    . ESOPHAGOGASTRODUODENOSCOPY (EGD) WITH PROPOFOL Left 02/16/2018   Procedure: ESOPHAGOGASTRODUODENOSCOPY (EGD) WITH PROPOFOL;  Surgeon: [name], MD;  Location: [facility];  Service: Gastroenterology;  Laterality: Left;  . LAPAROSCOPIC GASTRECTOMY N/A 03/12/2018   Procedure: LAPAROSCOPIC ASSISTED PARTIAL GASTRECTOMY ERAS PATHWAY;  Surgeon: Stark Klein, MD;  Location: [place];  Service: General;  Laterality: N/A;  . LAPAROSCOPIC PARTIAL GASTRECTOMY  03/12/2018  LAPAROSCOPIC ASSISTED PARTIAL GASTRECTOMY ERAS PATHWAY  . PROSTATECTOMY    . QUADRICEPS TENDON REPAIR Left 07/07/2017   Procedure: REPAIR QUADRICEP TENDON;  Surgeon: Rod Can, MD;  Location: [place];  Service: Orthopedics;  Laterality: Left;  . RIGHT CATARACT EXTRACTION  2013  . RIGTH NEPHRECTOMY  2006  . TONSILLECTOMY    . TRANSURETHRAL RESECTION OF PROSTATE  1997    Social History: Patient lives at a nursing facility.  The patient walks with a walker.  Non current smoker.  Allergies  Allergen Reactions  . Coconut Flavor [Flavoring Agent] Hypertension    Anything that is related to coconut---LOSS OF  CONSCIOUSNESS ([place])  . Coconut Oil Other (See Comments)    Anything that is related to coconut--- LOSS OF CONSCIOUSNESS ([place])  . Doxycycline Hyclate Hives  . Food Anaphylaxis    ALLERGIC TO ALL NUTS WITH THE EXCEPTION OF CASHEWS, ALMONDS  . Black [employer]  Other (See Comments)    Passes out ( can only eat almonds or peanuts)  . Coconut Oil Other (See Comments)    Faints   . Doxycycline Rash    Family history: family history includes Arthritis in his mother; Bone cancer in his mother; Clotting disorder in his daughter; Hypertension in his mother.  Prior to Admission medications   Medication Sig Start Date End Date Taking? Authorizing Provider  apixaban (ELIQUIS) 2.5 MG TABS tablet Take 1 tablet (2.5 mg total) by mouth 2 (two) times daily. 07/24/20  Yes Sherran Needs, NP  diltiazem (CARDIZEM SR) 120 MG 12 hr capsule Take 1 capsule (120 mg total) by mouth 2 (two) times daily. 07/24/20 08/24/20 Yes Sherran Needs, NP  gabapentin (NEURONTIN) 300 MG capsule Take 300 mg by mouth 3 (three) times daily as needed (legs-per MAR). Restless leg 05/14/20  Yes [provider]  mirtazapine (REMERON) 30 MG tablet Take 30 mg by mouth at bedtime. 11/21/20  Yes [provider]  MOVANTIK 25 MG TABS tablet Take 25 mg by mouth daily. 09/10/19  Yes [provider]  oxymorphone (OPANA) 10 MG tablet Take 10 mg by mouth every 6 (six) hours as needed for pain.  07/03/18  Yes [provider]  Jay Schlichter Oil (ARTIFICIAL TEARS) ointment Place 1 application into the left eye 3 (three) times daily. Left lower eye lid   Yes [provider]       Physical Exam: BP 107/84 (BP Location: Left Arm)   Pulse (!) 126   Temp 99 F (37.2 C) (Rectal)   Resp 19   SpO2 92%  General appearance: Frail appearing, adult male, alert and in no acute distress, but encephalopathic.     ENT: No nasal deformity, discharge, epistaxis.  Hearing slightly poor.  Poor  dentition Neck: No neck masses.  Trachea midline.  No thyromegaly/tenderness. Lymph: No cervical or supraclavicular lymphadenopathy. Skin: Warm and dry.  No jaundice.  No suspicious rashes or lesions. Cardiac: Tachycardic, irregularly irregular, nl S1-S2, no murmurs appreciated.  Trace LE edema.  Respiratory: Normal respiratory rate and rhythm.  Diminished breath sounds on the right Abdomen: Abdomen soft.  Nontender to palpation. No ascites, distension, hepatosplenomegaly.   MSK: No deformities or effusions of the large joints of the upper or lower extremities bilaterally.   Neuro: Cranial nerves 2 through 12 grossly intact.  Sensation intact to light touch. Psych: Confused at times, not agitated     Labs on Admission:  I have personally reviewed following labs and imaging studies: CBC: Recent Labs  Lab 12/17/20 1138  WBC 22.9*  NEUTROABS 20.1*  HGB 13.1  HCT 42.6  MCV 92.4  PLT 536   Basic Metabolic Panel: Recent Labs  Lab 12/17/20 1138  NA 147*  K 4.0  CL 108  CO2 33*  GLUCOSE 145*  BUN 43*  CREATININE 1.13  CALCIUM 9.5   GFR: CrCl cannot be calculated (Unknown ideal weight.).  Liver Function Tests: Recent Labs  Lab 12/17/20 1138  AST 54*  ALT 33  ALKPHOS 81  BILITOT 0.8  PROT 7.0  ALBUMIN 3.1*   No results for input(s): LIPASE, AMYLASE in the last 168 hours. No results for input(s): AMMONIA in the last 168 hours. Coagulation Profile: No results for input(s): INR, PROTIME in the last 168 hours. Cardiac Enzymes: No results for input(s): CKTOTAL, CKMB, CKMBINDEX, TROPONINI in the last 168 hours. BNP (last 3 results) No results for input(s): PROBNP in the last 8760 hours. HbA1C: No results for input(s): HGBA1C in the last 72 hours. CBG: No results for input(s): GLUCAP in the last 168 hours. Lipid Profile: No results for input(s): CHOL, HDL, LDLCALC, TRIG, CHOLHDL, LDLDIRECT in the last 72 hours. Thyroid Function Tests: No results for input(s): TSH,  T4TOTAL, FREET4, T3FREE, THYROIDAB in the last 72 hours. Anemia Panel: No results for input(s): VITAMINB12, FOLATE, FERRITIN, TIBC, IRON, RETICCTPCT in the last 72 hours.   No results found for this or any previous visit (from the past 240 hour(s)).         Radiological Exams on Admission: Personally reviewed personally by me.  Showed findings concerning for new right lung pathology.  DG Chest Port 1 View  Result Date: 12/17/2020 CLINICAL DATA:  87 year old male with altered mental status. Confusion. History of gastrointestinal cancer stated on prior CT Chest, Abdomen, and Pelvis - most recently 04/06/2020. EXAM: PORTABLE CHEST 1 VIEW COMPARISON:  Portable chest 09/11/2020 and earlier, including chest CT 04/26/2020. FINDINGS: Portable AP semi upright view at 1141 hours. Moderate veiling opacity in the right lower lung is new since October. Obscured right hemidiaphragm. Lung volumes appear stable. Mediastinal contours appear stable. Allowing for portable technique the left lung is clear. No pneumothorax or pulmonary edema. Visualized tracheal air column is within normal limits. Stable surgical clips in the right upper quadrant. Chronic right lateral rib fractures appear stable. IMPRESSION: 1. Moderate size new veiling opacity in the right lower lung. This could be a moderate size right pleural effusion with atelectasis, or a combination of pleural fluid with pneumonia or new lung mass (history of GI stromal tumor). Chest CT (IV contrast preferred) would best characterize further. 2. Left lung remains clear. Electronically Signed   By: Genevie Ann M.D.   On: 12/17/2020 11:59    EKG: Independently reviewed.  Shows A. fib with RVR    Assessment/Plan   1.  Acute metabolic encephalopathy - At baseline, patient has normal mentation.  Definitely not at his baseline per son - Specific etiology unknown at this time, possibly related to infection.  No recent changes in meds per son. -Fall precautions -  Delirium precautions - We will order CT head  2.  A. fib with RVR - History of A. fib, diagnosed in the past several months - At home on rate control with Cardizem, and anticoagulation with Eliquis 2.5 mg BID - Placed on Cardizem drip in the ED, continue - Continue home Eliquis - Telemetry  3.  New moderate size right lower lung opacity, differential includes pleural effusion, atelectasis, pneumonia, and/or new lung  mass - Chest x-ray on admission showed new right lower lung opacity - CT chest ordered - Patient was given azithromycin and Rocephin in the ED, which will cover for 24 hours.  We will hold off on further antibiotics until scan returns  4.  Hypernatremia - Likely secondary to poor p.o. intake - On admission sodium 147 - Fluid boluses given in ED - Follow-up labs ordered  5.  Chronic pain -Continue home pain regimen equivalent and home laxative  6.  Leukocytosis, POA - Possibly related to infection, pneumonia.  UA unimpressive for UTI - Follow-up urine and blood cultures -Patient was given azithromycin and Rocephin in the ED, which will cover for 24 hours.  We will hold off on further antibiotics until further results      DVT prophylaxis: Eliquis Code Status: DNR Family Communication: The son bedside Disposition Plan: Anticipate discharge back to SNF when able Consults called: None Admission status: Inpatient    Medical decision making: Patient seen at 3:44 PM on 12/17/2020.  The patient was discussed with ER provider.  What exists of the patient's chart was reviewed in depth and summarized above.  Clinical condition: Watcher.        Doran Heater [facility] Please page though [place] or Epic secure chat:  For password, contact charge nurse

## 2020-12-18 ENCOUNTER — Encounter (HOSPITAL_COMMUNITY): Payer: Self-pay | Admitting: Internal Medicine

## 2020-12-18 ENCOUNTER — Inpatient Hospital Stay (HOSPITAL_COMMUNITY): Payer: Medicare PPO

## 2020-12-18 DIAGNOSIS — R0602 Shortness of breath: Secondary | ICD-10-CM

## 2020-12-18 DIAGNOSIS — E870 Hyperosmolality and hypernatremia: Secondary | ICD-10-CM | POA: Diagnosis not present

## 2020-12-18 DIAGNOSIS — I4891 Unspecified atrial fibrillation: Secondary | ICD-10-CM | POA: Diagnosis not present

## 2020-12-18 DIAGNOSIS — G9341 Metabolic encephalopathy: Secondary | ICD-10-CM | POA: Diagnosis not present

## 2020-12-18 LAB — COMPREHENSIVE METABOLIC PANEL
ALT: 34 U/L (ref 0–44)
AST: 52 U/L — ABNORMAL HIGH (ref 15–41)
Albumin: 2.6 g/dL — ABNORMAL LOW (ref 3.5–5.0)
Alkaline Phosphatase: 82 U/L (ref 38–126)
Anion gap: 12 (ref 5–15)
BUN: 32 mg/dL — ABNORMAL HIGH (ref 8–23)
CO2: 26 mmol/L (ref 22–32)
Calcium: 8.7 mg/dL — ABNORMAL LOW (ref 8.9–10.3)
Chloride: 111 mmol/L (ref 98–111)
Creatinine, Ser: 0.84 mg/dL (ref 0.61–1.24)
GFR, Estimated: >60 mL/min (ref >60)
Glucose, Bld: 96 mg/dL (ref 70–99)
Potassium: 3.8 mmol/L (ref 3.5–5.1)
Sodium: 149 mmol/L — ABNORMAL HIGH (ref 135–145)
Total Bilirubin: 0.7 mg/dL (ref 0.3–1.2)
Total Protein: 6 g/dL — ABNORMAL LOW (ref 6.5–8.1)

## 2020-12-18 LAB — CBC
HCT: 40.6 % (ref 39.0–52.0)
Hemoglobin: 12.7 g/dL — ABNORMAL LOW (ref 13.0–17.0)
MCH: 28.7 pg (ref 26.0–34.0)
MCHC: 31.3 g/dL (ref 30.0–36.0)
MCV: 91.9 fL (ref 80.0–100.0)
Platelets: 294 10*3/uL (ref 150–400)
RBC: 4.42 MIL/uL (ref 4.22–5.81)
RDW: 14.7 % (ref 11.5–15.5)
WBC: 22.1 10*3/uL — ABNORMAL HIGH (ref 4.0–10.5)
nRBC: 0 % (ref 0.0–0.2)

## 2020-12-18 LAB — BODY FLUID CELL COUNT WITH DIFFERENTIAL
Eos, Fluid: 0 %
Lymphs, Fluid: 0 %
Monocyte-Macrophage-Serous Fluid: 3 % — ABNORMAL LOW (ref 50–90)
Neutrophil Count, Fluid: 97 % — ABNORMAL HIGH (ref 0–25)
Total Nucleated Cell Count, Fluid: 1176 cu mm — ABNORMAL HIGH (ref 0–1000)

## 2020-12-18 LAB — ALBUMIN, PLEURAL OR PERITONEAL FLUID: Albumin, Fluid: 2 g/dL

## 2020-12-18 LAB — LACTATE DEHYDROGENASE, PLEURAL OR PERITONEAL FLUID: LD, Fluid: 1318 U/L — ABNORMAL HIGH (ref 3–23)

## 2020-12-18 LAB — PROTEIN, PLEURAL OR PERITONEAL FLUID: Total protein, fluid: 3.9 g/dL

## 2020-12-18 LAB — MAGNESIUM: Magnesium: 2.2 mg/dL (ref 1.7–2.4)

## 2020-12-18 MED ORDER — LIDOCAINE HCL 1 % IJ SOLN
INTRAMUSCULAR | Status: AC
  Filled 2020-12-18: qty 20

## 2020-12-18 MED ORDER — SODIUM CHLORIDE 0.9 % IV SOLN
500.0000 mg | INTRAVENOUS | Status: DC
  Administered 2020-12-18 – 2020-12-20 (×3): 500 mg via INTRAVENOUS
  Filled 2020-12-18 (×4): qty 500

## 2020-12-18 MED ORDER — SODIUM CHLORIDE 0.9 % IV SOLN
1.0000 g | INTRAVENOUS | Status: AC
  Administered 2020-12-18 – 2020-12-24 (×7): 1 g via INTRAVENOUS
  Filled 2020-12-18 (×3): qty 10
  Filled 2020-12-18: qty 1
  Filled 2020-12-18 (×2): qty 10
  Filled 2020-12-18: qty 1

## 2020-12-18 MED ORDER — SODIUM CHLORIDE 0.9 % IV SOLN: 1.0000 g | INTRAVENOUS | Status: DC

## 2020-12-18 MED ORDER — DEXTROSE-NACL 5-0.45 % IV SOLN: INTRAVENOUS | Status: DC

## 2020-12-18 MED ORDER — DIGOXIN 0.25 MG/ML IJ SOLN
0.2500 mg | Freq: Once | INTRAMUSCULAR | Status: AC
  Administered 2020-12-18: 0.25 mg via INTRAVENOUS
  Filled 2020-12-18: qty 1

## 2020-12-18 NOTE — ED Notes (Signed)
Paged on call provider about pt being maxed out on cardizem and was still in afib in the 120s-130s. Awaiting orders at this time. Pt resting comfortably in stretcher at this time.

## 2020-12-18 NOTE — ED Notes (Signed)
Pt to ultrasound

## 2020-12-18 NOTE — ED Notes (Signed)
MD notified about pt in Afib now and about updated EKG of pts rhythm change and titration of Cardizem

## 2020-12-18 NOTE — ED Notes (Signed)
Nurse in room. Repeat EKG printed on pt. Pt VS updated. Cardizem drip titrated to help with pts HR.

## 2020-12-18 NOTE — Progress Notes (Signed)
PROGRESS NOTE    Casey Gates.  SNK:[id number] DOB: Dec 12, 1925 DOA: 12/17/2020 PCP: Velna Hatchet, MD    Chief Complaint  Patient presents with  . Urinary Tract Infection    Brief Narrative:   87 year old gentleman prior history of atrial fibrillation on anticoagulation, obstructive sleep apnea, chronic pain syndrome presents to ED for altered mental status. On arrival to ED he was afebrile, tachycardic chest x-ray showed right lower lung opacity.  CT of the chest with contrast shows Patchy peribronchovascular opacity in the left lower suspicious for pneumonia. Small to moderate-sized partly loculated right pleural effusion. Significant atelectasis of the right lower lobe, with a smaller area of atelectasis/scarring of the right middle lobe.  He was started on IV Rocephin and Zithromax. Assessment & Plan:   Active Problems:   Atrial fibrillation with RVR (HCC)   Acute metabolic encephalopathy   Hypernatremia   Sepsis secondary to left lower lobe pneumonia On arrival to ED patient was tachycardic, WBC count around 22,000 and CT chest shows left lower lobe pneumonia. Lactic acid within normal limits. Started on IV Rocephin and IV Zithromax. Nasal cannula oxygen to keep sats greater than 90%.     Atrial fibrillation with RVR Probably driven by respiratory issues and pneumonia Rate controlled with Cardizem gtt. and on Eliquis for anticoagulation.    Hypernatremia probably secondary to poor oral intake and free water deficit    Acute metabolic encephalopathy probably secondary to hypernatremia and pneumonia.    Right moderate loculated pleural effusion Ultrasound thoracentesis order and pleural fluid to be sent for analysis if able to aspirate. Incentive spirometry will be ordered.   Dietary consulted for nutrition.     DVT prophylaxis: Eliquis Code Status: DNR Family Communication: Discussed with son at bedside and granddaughter over the  phone Disposition:   Status is: Inpatient  Remains inpatient appropriate because:Ongoing diagnostic testing needed not appropriate for outpatient work up, Unsafe d/c plan, IV treatments appropriate due to intensity of illness or inability to take PO and Inpatient level of care appropriate due to severity of illness   Dispo: The patient is from: SNF              Anticipated d/c is to: SNF              Anticipated d/c date is: > 3 days              Patient currently is not medically stable to d/c.       Consultants:   Palliative care consult  Procedures: Ultrasound thoracentesis ordered  Antimicrobials: IV Rocephin and Zithromax since admission   Subjective: No new complaints  Objective: Vitals:   12/18/20 0645 12/18/20 0945 12/18/20 1230 12/18/20 1303  BP: 124/78 (!) 125/94 104/75 (!) 113/18  Pulse: (!) 115 (!) 117 (!) 112   Resp: (!) 25 (!) 26 18   Temp:      TempSrc:      SpO2: 93% 94% 93%   Weight:      Height:        Intake/Output Summary (Last 24 hours) at 12/18/2020 1316 Last data filed at 12/17/2020 1504 Gross per 24 hour  Intake 1000 ml  Output --  Net 1000 ml   Filed Weights   12/17/20 2323  Weight: 63.5 kg    Examination:  General exam: Cachectic looking gentleman on room air, confused, does not appear to be in any kind of distress Respiratory system: Diminished air entry on the right scattered rhonchi  Cardiovascular system: S1 & S2 heard, irregularly irregular and tachycardic no pedal edema. Gastrointestinal system: Abdomen is nondistended, soft and nontender. Normal bowel sounds heard. Central nervous system: Confused opening eyes to verbal cues able to move all extremities spontaneously Extremities: No pedal edema Skin: No rashes seen psychiatry: Cannot be assessed due to confusion    Data Reviewed: I have personally reviewed following labs and imaging studies  CBC: Recent Labs  Lab 12/17/20 1138 12/18/20 0425  WBC 22.9* 22.1*   NEUTROABS 20.1*  --   HGB 13.1 12.7*  HCT 42.6 40.6  MCV 92.4 91.9  PLT 262 034    Basic Metabolic Panel: Recent Labs  Lab 12/17/20 1138 12/18/20 0425  NA 147* 149*  K 4.0 3.8  CL 108 111  CO2 33* 26  GLUCOSE 145* 96  BUN 43* 32*  CREATININE 1.13 0.84  CALCIUM 9.5 8.7*  MG  --  2.2    GFR: Estimated Creatinine Clearance: 50.4 mL/min (by C-G formula based on SCr of 0.84 mg/dL).  Liver Function Tests: Recent Labs  Lab 12/17/20 1138 12/18/20 0425  AST 54* 52*  ALT 33 34  ALKPHOS 81 82  BILITOT 0.8 0.7  PROT 7.0 6.0*  ALBUMIN 3.1* 2.6*    CBG: No results for input(s): GLUCAP in the last 168 hours.   Recent Results (from the past 240 hour(s))  Culture, blood (routine x 2)     Status: None (Preliminary result)   Collection Time: 12/17/20 10:20 AM   Specimen: BLOOD  Result Value Ref Range Status   Specimen Description   Final    BLOOD BLOOD RIGHT FOREARM Performed at [facility] [address] [place], [place] [address]    Special Requests   Final    BOTTLES DRAWN AEROBIC AND ANAEROBIC Blood Culture adequate volume Performed at [facility] [address] [place], [place] [address]    Culture   Final    NO GROWTH < 24 HOURS Performed at [facility] [address], [place], [place] [address]    Report Status PENDING  Incomplete  Culture, blood (routine x 2)     Status: None (Preliminary result)   Collection Time: 12/17/20 10:25 AM   Specimen: BLOOD  Result Value Ref Range Status   Specimen Description   Final    BLOOD BLOOD LEFT FOREARM Performed at [facility] [address] [place], [place] [address]    Special Requests   Final    BOTTLES DRAWN AEROBIC AND ANAEROBIC Blood Culture results may not be optimal due to an inadequate volume of blood received in culture bottles Performed at [facility] [address], [place], [place] [address]    Culture   Final     NO GROWTH < 24 HOURS Performed at [facility] [address] [place], [place] [address]    Report Status PENDING  Incomplete  SARS CORONAVIRUS 2 (TAT 6-24 HRS) Nasopharyngeal Nasopharyngeal Swab     Status: None   Collection Time: 12/17/20  3:02 PM   Specimen: Nasopharyngeal Swab  Result Value Ref Range Status   SARS Coronavirus 2 NEGATIVE NEGATIVE Final    Comment: (NOTE) SARS-CoV-2 target nucleic acids are NOT DETECTED.  The SARS-CoV-2 RNA is generally detectable in upper and lower respiratory specimens during the acute phase of infection. Negative results do not preclude SARS-CoV-2 infection, do not rule out co-infections with other pathogens, and should not be used as the  sole basis for treatment or other patient management decisions. Negative results must be combined with clinical observations, patient history, and epidemiological information. The expected result is Negative.  Fact Sheet for Patients: SugarRoll.be  Fact Sheet for Healthcare Providers: https://www.woods-mathews.com/  This test is not yet approved or cleared by the [place] FDA and  has been authorized for detection and/or diagnosis of SARS-CoV-2 by FDA under an Emergency Use Authorization (EUA). This EUA will remain  in effect (meaning this test can be used) for the duration of the COVID-19 declaration under Se ction 564(b)(1) of the Act, 21 U.S.C. section 360bbb-3(b)(1), unless the authorization is terminated or revoked sooner.  Performed at [facility], [place] [address], [place], [place] [address]          Radiology Studies: CT HEAD WO CONTRAST  Result Date: 12/17/2020 CLINICAL DATA:  UTI with AMS Patient had 75 cc of isvoue 300 at [id number] EXAM: CT HEAD WITHOUT CONTRAST TECHNIQUE: Contiguous axial images were obtained from the base of the skull through the vertex without intravenous contrast. COMPARISON:  09/01/2020 a lesion FINDINGS:  Brain: No evidence of acute infarction, hemorrhage, hydrocephalus, extra-axial collection or mass lesion/mass effect. There is ventricular sulcal enlargement reflecting diffuse atrophy stable from the prior study. Mild patchy areas of white matter hypoattenuation are also present and stable consistent with chronic microvascular ischemic change. Vascular: No hyperdense vessel or unexpected calcification. Skull: Normal. Negative for fracture or focal lesion. Sinuses/Orbits: Globes and orbits are unremarkable. Visualized sinuses are essentially clear. Other: None. IMPRESSION: 1. No acute intracranial abnormalities. 2. Atrophy and mild chronic microvascular ischemic change stable from prior study. Electronically Signed   By: Lajean Manes M.D.   On: 12/17/2020 18:27   CT Chest W Contrast  Result Date: 12/17/2020 CLINICAL DATA:  Follow-up for abnormal chest radiograph. Patient history of confusion/altered mental status. History of gastrointestinal carcinoma. EXAM: CT CHEST WITH CONTRAST TECHNIQUE: Multidetector CT imaging of the chest was performed during intravenous contrast administration. CONTRAST:  81mL OMNIPAQUE IOHEXOL 300 MG/ML  SOLN COMPARISON:  04/06/2020 and current chest radiograph FINDINGS: Cardiovascular: Heart is mildly enlarged. Dense three-vessel coronary artery calcifications. Ascending aorta dilated to 4.2 cm. Aortic atherosclerotic calcifications. Mediastinum/Nodes: No neck base, mediastinal or hilar masses or enlarged lymph nodes. Trachea is unremarkable esophagus is normal in caliber. Lungs/Pleura: Moderate partly loculated right pleural effusion. Loculated fluid is noted along the mid to upper oblique fissure. Bronchus intermedius is partly effaced. There is atelectasis a significant portion of the right lower lobe. Central opacity with mild bronchiectasis is noted in the posteroinferior right middle lobe. There is patchy opacity in a peribronchovascular distribution in the left lower lobe that  may also be atelectasis, although has a configuration suggesting at least a component of pneumonia. 17 mm focus of ground-glass opacity is noted peripherally in the left upper lobe, centered on image 61, series 5. No evidence of pulmonary edema. No lung mass or suspicious nodule. No pneumothorax. Upper Abdomen: No acute findings. Status post cholecystectomy. Increase in colonic stool burden. Musculoskeletal: No fracture or acute finding.  No bone lesion. IMPRESSION: 1. Patchy peribronchovascular opacity in the left lower suspicious for pneumonia. 2. Small to moderate-sized partly loculated right pleural effusion. Significant atelectasis of the right lower lobe, with a smaller area of atelectasis/scarring of the right middle lobe. 3. Small focus of ground-glass opacity in the peripheral left upper lobe. This may reflect additional infection. It also qualifies as a ground-glass nodule in follow-up is recommended. Initial follow-up with  CT at 6-12 months is recommended to confirm persistence. If persistent, repeat CT is recommended every 2 years until 5 years of stability has been established. This recommendation follows the consensus statement: Guidelines for Management of Incidental Pulmonary Nodules Detected on CT Images: From the Fleischner Society 2017; Radiology 2017; 284:228-243. 4. Mild cardiomegaly. Dense three-vessel coronary artery calcifications. Aortic atherosclerosis. 5. Ascending aorta dilated to 4.1 cm. Recommend annual imaging followup by CTA or MRA. This recommendation follows 2010 ACCF/AHA/AATS/ACR/ASA/SCA/SCAI/SIR/STS/SVM Guidelines for the Diagnosis and Management of Patients with Thoracic Aortic Disease. Circulation. 2010; [id number]. Aortic aneurysm NOS (ICD10-I71.9) Aortic Atherosclerosis (ICD10-I70.0). Electronically Signed   By: Lajean Manes M.D.   On: 12/17/2020 16:25   DG Chest Port 1 View  Result Date: 12/17/2020 CLINICAL DATA:  87 year old male with altered mental status.  Confusion. History of gastrointestinal cancer stated on prior CT Chest, Abdomen, and Pelvis - most recently 04/06/2020. EXAM: PORTABLE CHEST 1 VIEW COMPARISON:  Portable chest 09/11/2020 and earlier, including chest CT 04/26/2020. FINDINGS: Portable AP semi upright view at 1141 hours. Moderate veiling opacity in the right lower lung is new since October. Obscured right hemidiaphragm. Lung volumes appear stable. Mediastinal contours appear stable. Allowing for portable technique the left lung is clear. No pneumothorax or pulmonary edema. Visualized tracheal air column is within normal limits. Stable surgical clips in the right upper quadrant. Chronic right lateral rib fractures appear stable. IMPRESSION: 1. Moderate size new veiling opacity in the right lower lung. This could be a moderate size right pleural effusion with atelectasis, or a combination of pleural fluid with pneumonia or new lung mass (history of GI stromal tumor). Chest CT (IV contrast preferred) would best characterize further. 2. Left lung remains clear. Electronically Signed   By: Genevie Ann M.D.   On: 12/17/2020 11:59        Scheduled Meds: . lidocaine      . apixaban  2.5 mg Oral BID  . artificial tears  1 application Left Eye TID  . mirtazapine  30 mg Oral QHS  . naloxegol oxalate  25 mg Oral Daily   Continuous Infusions: . azithromycin    . cefTRIAXone (ROCEPHIN)  IV 1 g (12/18/20 1139)  . dextrose 5 % and 0.45% NaCl 75 mL/hr at 12/18/20 1139  . diltiazem (CARDIZEM) infusion 15 mg/hr (12/18/20 0726)     LOS: 1 day        Hosie Poisson, MD [facility]   To contact the attending provider between 7A-7P or the covering provider during after hours 7P-7A, please log into the web site [email or web address] and access using universal [place] password for that web site. If you do not have the password, please call the hospital operator.  12/18/2020, 1:16 PM

## 2020-12-18 NOTE — ED Notes (Signed)
RN attempted to feed patient. Patient ate some of his grits and drank all his milk but refused to eat more than that. Patient drank juice.

## 2020-12-18 NOTE — Procedures (Signed)
Ultrasound-guided diagnostic and therapeutic right thoracentesis performed yielding 225 cc of hazy, amber fluid. No immediate complications. Follow-up chest x-ray pending. The fluid was sent to the lab for preordered studies. The pleural effusion was multiloculated by [place] today.

## 2020-12-19 ENCOUNTER — Encounter (HOSPITAL_COMMUNITY): Payer: Self-pay | Admitting: Internal Medicine

## 2020-12-19 DIAGNOSIS — R0602 Shortness of breath: Secondary | ICD-10-CM | POA: Diagnosis not present

## 2020-12-19 DIAGNOSIS — I4891 Unspecified atrial fibrillation: Secondary | ICD-10-CM | POA: Diagnosis not present

## 2020-12-19 DIAGNOSIS — E870 Hyperosmolality and hypernatremia: Secondary | ICD-10-CM | POA: Diagnosis not present

## 2020-12-19 DIAGNOSIS — A419 Sepsis, unspecified organism: Principal | ICD-10-CM

## 2020-12-19 DIAGNOSIS — I4819 Other persistent atrial fibrillation: Secondary | ICD-10-CM

## 2020-12-19 DIAGNOSIS — G9341 Metabolic encephalopathy: Secondary | ICD-10-CM | POA: Diagnosis not present

## 2020-12-19 LAB — CBC WITH DIFFERENTIAL/PLATELET
Abs Immature Granulocytes: 0.28 10*3/uL — ABNORMAL HIGH (ref 0.00–0.07)
Basophils Absolute: 0.1 10*3/uL (ref 0.0–0.1)
Basophils Relative: 0 %
Eosinophils Absolute: 0.1 10*3/uL (ref 0.0–0.5)
Eosinophils Relative: 0 %
HCT: 39.4 % (ref 39.0–52.0)
Hemoglobin: 12.5 g/dL — ABNORMAL LOW (ref 13.0–17.0)
Immature Granulocytes: 1 %
Lymphocytes Relative: 6 %
Lymphs Abs: 1.2 10*3/uL (ref 0.7–4.0)
MCH: 28.8 pg (ref 26.0–34.0)
MCHC: 31.7 g/dL (ref 30.0–36.0)
MCV: 90.8 fL (ref 80.0–100.0)
Monocytes Absolute: 1.5 10*3/uL — ABNORMAL HIGH (ref 0.1–1.0)
Monocytes Relative: 7 %
Neutro Abs: 17.7 10*3/uL — ABNORMAL HIGH (ref 1.7–7.7)
Neutrophils Relative %: 86 %
Platelets: 358 10*3/uL (ref 150–400)
RBC: 4.34 MIL/uL (ref 4.22–5.81)
RDW: 14.6 % (ref 11.5–15.5)
WBC: 20.8 10*3/uL — ABNORMAL HIGH (ref 4.0–10.5)
nRBC: 0 % (ref 0.0–0.2)

## 2020-12-19 LAB — LACTATE DEHYDROGENASE: LDH: 160 U/L (ref 98–192)

## 2020-12-19 LAB — BASIC METABOLIC PANEL
Anion gap: 10 (ref 5–15)
BUN: 20 mg/dL (ref 8–23)
CO2: 28 mmol/L (ref 22–32)
Calcium: 8.3 mg/dL — ABNORMAL LOW (ref 8.9–10.3)
Chloride: 109 mmol/L (ref 98–111)
Creatinine, Ser: 0.79 mg/dL (ref 0.61–1.24)
GFR, Estimated: >60 mL/min (ref >60)
Glucose, Bld: 131 mg/dL — ABNORMAL HIGH (ref 70–99)
Potassium: 3.3 mmol/L — ABNORMAL LOW (ref 3.5–5.1)
Sodium: 147 mmol/L — ABNORMAL HIGH (ref 135–145)

## 2020-12-19 LAB — CYTOLOGY - NON PAP

## 2020-12-19 LAB — MRSA PCR SCREENING: MRSA by PCR: POSITIVE — AB

## 2020-12-19 MED ORDER — CHLORHEXIDINE GLUCONATE CLOTH 2 % EX PADS
6.0000 | MEDICATED_PAD | Freq: Every day | CUTANEOUS | Status: AC
  Administered 2020-12-20 – 2020-12-24 (×4): 6 via TOPICAL

## 2020-12-19 MED ORDER — ENSURE ENLIVE PO LIQD
237.0000 mL | Freq: Two times a day (BID) | ORAL | Status: DC
  Administered 2020-12-19 – 2021-01-10 (×30): 237 mL via ORAL

## 2020-12-19 MED ORDER — CHLORHEXIDINE GLUCONATE CLOTH 2 % EX PADS
6.0000 | MEDICATED_PAD | Freq: Every day | CUTANEOUS | Status: DC
  Administered 2020-12-19 – 2021-01-10 (×22): 6 via TOPICAL

## 2020-12-19 MED ORDER — MUPIROCIN 2 % EX OINT
1.0000 "application " | TOPICAL_OINTMENT | Freq: Two times a day (BID) | CUTANEOUS | Status: AC
  Administered 2020-12-19 – 2020-12-23 (×10): 1 via NASAL
  Filled 2020-12-19 (×3): qty 22

## 2020-12-19 MED ORDER — POTASSIUM CHLORIDE CRYS ER 20 MEQ PO TBCR
40.0000 meq | EXTENDED_RELEASE_TABLET | Freq: Once | ORAL | Status: AC
  Administered 2020-12-19: 40 meq via ORAL
  Filled 2020-12-19: qty 2

## 2020-12-19 MED ORDER — RESOURCE THICKENUP CLEAR PO POWD
ORAL | Status: DC | PRN
  Filled 2020-12-19: qty 125

## 2020-12-19 NOTE — Progress Notes (Signed)
BSE completed.  Full report to follow.  Pt has h/o dysphagia dating back to 2014 with UES spasm thus decreased opening, esophageal tertiary contractions.  To mitigate aspiration, recommend to modify diet to dys3/nectar with meals and allow thin water between meals.  Stay upright afer meals.  Will follow up for dysphagia management.   [name], MS [facility] SLP Acute Rehab Services Office [phone] Pager [phone]

## 2020-12-19 NOTE — Progress Notes (Addendum)
Thank you for this consult- chart reviewed.   Called patient's son- Casey Gates.   [place] meeting scheduled for tomorrow at 1030.   Mariana Kaufman, AGNP-C Palliative Medicine  Please call Palliative Medicine team phone with any questions [phone]. For individual providers please see AMION.  No charge

## 2020-12-19 NOTE — Consult Note (Addendum)
Cardiology Consultation:   Patient ID: [name]. MRN: [id number]; DOB: June 14, 1926  Admit date: 12/17/2020 Date of Consult: 12/19/2020  Primary Care Provider: Velna Hatchet, MD [facility] Cardiologist: Larae Grooms, MD  [facility] Electrophysiologist:  None    Patient Profile:   Antohny Samuels. is a 87 y.o. 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 Karleen Hampshire.  History of Present Illness:   The patient is confused and mumbling nonsensically so history is obtained from the chart and from talking to patient's son at bedside. He lives at [place]. He remotely saw Dr. Irish Lack for atypical chest pain with conservative approach pursued without further testing aside from reassuring echo in 2019 with EF 65-70%, mild LAE. In 06/2020 he was seen in the ED after a mechanical fall and incidentally found to be in atrial fib with RVR. He was initially felt to be poor anticoagulation candidate but this was revisited as outpatient and he was placed on Eliquis. Rate control strategy was pursued with baseline HR around 100bpm felt acceptable for his age and sedentary baseline. He'd also had some progressive weight loss as well. On 11/23/20 he tested positive for Covid and subsequent to that was having some issues with productive cough and was treated with Mucinex briefly with improvement.  On Thursday 11/13 his son got a call from his facility that his dad was somewhat confused and they were working him up for a UTI. Unfortunately his son had Covid at that time and was finishing out his own quarantine so could not get up there to see him. He did visit two days later when he was safe to do so and noticed his dad still seemed somewhat out of it. Son inquired  to the facility about the UTI results and they had not yet been sent. Son was concerned it was due to lack of rest/having just woken up so checked on him the following day and his dad was still confused. Therefore the patient was brought to the hospital. He was found to have leukocytosis and sepsis felt due to left lower lobe pneumonia on CT. Other issues include hypernatremia felt due to poor oral intake, acute metabolic encephalopathy due to hypernatremia and pneumonia, and pleural effusion (-225cc out by thoracentesis, but multiloculated by the [place]). He is still having low grade temps at this time. HR was elevated on admission so he was started on diltiazem with 5mg  bolus then drip with improvement to the 100-1teen range. He was on 120mg  BID at home. Other ancillary findigns on CT include mild cardiomegaly, dense 3v coronary calcification, aortic atherosclerosis and dilated ascending aorta (4.1cm). No recent chest pain. Breathing appears unlabored.    Past Medical History:  Diagnosis Date  . Anemia   . Asthma    AS A CHILD  . Blind left eye   . Cancer [facility])    PROSTATE CANCER  . Cellulitis    frequently, "all over"  . Chronic pain syndrome   . Chronic ulcer of toe, right, with unspecified severity ([place])    2nd toe  . Constipation   . DDD (degenerative disc disease), lumbar   . Gastrointestinal stromal tumor (GIST) of stomach ([place])   . GI bleeding   . History of blood  transfusion    01/2018  . Insomnia   . Kyphoscoliosis   . Mass in the abdomen 01/2018  . Persistent atrial fibrillation ([place])   . Restless leg   . Sleep apnea    does not wear CPAP  . Wears glasses     Past Surgical History:  Procedure Laterality Date  . AMPUTATION TOE Right 11/05/2018   Procedure: Right 2nd toe ampuation;  Surgeon: Wylene Simmer, MD;  Location: [place];  Service: Orthopedics;  Laterality: Right;  47min  . CHOLECYSTECTOMY    . COLONOSCOPY    . ESOPHAGOGASTRODUODENOSCOPY (EGD) WITH  PROPOFOL Left 02/16/2018   Procedure: ESOPHAGOGASTRODUODENOSCOPY (EGD) WITH PROPOFOL;  Surgeon: [name], MD;  Location: [place];  Service: Gastroenterology;  Laterality: Left;  . LAPAROSCOPIC GASTRECTOMY N/A 03/12/2018   Procedure: LAPAROSCOPIC ASSISTED PARTIAL GASTRECTOMY ERAS PATHWAY;  Surgeon: Stark Klein, MD;  Location: [place];  Service: General;  Laterality: N/A;  . LAPAROSCOPIC PARTIAL GASTRECTOMY  03/12/2018   LAPAROSCOPIC ASSISTED PARTIAL GASTRECTOMY ERAS PATHWAY  . PROSTATECTOMY    . QUADRICEPS TENDON REPAIR Left 07/07/2017   Procedure: REPAIR QUADRICEP TENDON;  Surgeon: Rod Can, MD;  Location: [place];  Service: Orthopedics;  Laterality: Left;  . RIGHT CATARACT EXTRACTION  2013  . RIGTH NEPHRECTOMY  2006  . TONSILLECTOMY    . TRANSURETHRAL RESECTION OF PROSTATE  1997     Home Medications:  Prior to Admission medications   Medication Sig Start Date End Date Taking? Authorizing Provider  apixaban (ELIQUIS) 2.5 MG TABS tablet Take 1 tablet (2.5 mg total) by mouth 2 (two) times daily. 07/24/20  Yes Sherran Needs, NP  diltiazem (CARDIZEM SR) 120 MG 12 hr capsule Take 1 capsule (120 mg total) by mouth 2 (two) times daily. 07/24/20 08/24/20 Yes Sherran Needs, NP  gabapentin (NEURONTIN) 300 MG capsule Take 300 mg by mouth 3 (three) times daily as needed (legs-per MAR). Restless leg 05/14/20  Yes [provider]  mirtazapine (REMERON) 30 MG tablet Take 30 mg by mouth at bedtime. 11/21/20  Yes [provider]  MOVANTIK 25 MG TABS tablet Take 25 mg by mouth daily. 09/10/19  Yes [provider]  oxymorphone (OPANA) 10 MG tablet Take 10 mg by mouth every 6 (six) hours as needed for pain.  07/03/18  Yes [provider]  Jay Schlichter Oil (ARTIFICIAL TEARS) ointment Place 1 application into the left eye 3 (three) times daily. Left lower eye lid   Yes [provider]    Inpatient Medications: Scheduled Meds: . apixaban  2.5 mg  Oral BID  . artificial tears  1 application Left Eye TID  . Chlorhexidine Gluconate Cloth  6 each Topical Daily  . feeding supplement  237 mL Oral BID BM  . mirtazapine  30 mg Oral QHS  . naloxegol oxalate  25 mg Oral Daily   Continuous Infusions: . azithromycin Stopped (12/18/20 1656)  . cefTRIAXone (ROCEPHIN)  IV Stopped (12/18/20 2027)  . dextrose 5 % and 0.45% NaCl 75 mL/hr at 12/19/20 0657  . diltiazem (CARDIZEM) infusion 15 mg/hr (12/19/20 0759)   PRN Meds: acetaminophen **OR** acetaminophen, gabapentin, ipratropium-albuterol, morphine, ondansetron **OR** ondansetron (ZOFRAN) IV  Allergies:    Allergies  Allergen Reactions  . Coconut Flavor [Flavoring Agent] Hypertension    Anything that is related to coconut---LOSS OF CONSCIOUSNESS (SYNCOPE)  . Coconut Oil Other (See Comments)    Anything that is related to coconut--- LOSS OF CONSCIOUSNESS (SYNCOPE)  . Doxycycline Hyclate Hives  .  Food Anaphylaxis    ALLERGIC TO ALL NUTS WITH THE EXCEPTION OF CASHEWS, ALMONDS  . Black [employer]  Other (See Comments)    Passes out ( can only eat almonds or peanuts)  . Coconut Oil Other (See Comments)    Faints   . Doxycycline Rash    Social History:   Social History   Socioeconomic History  . Marital status: Married    Spouse name: Not on file  . Number of children: 2  . Years of education: Not on file  . Highest education level: Not on file  Occupational History  . Not on file  Tobacco Use  . Smoking status: Never Smoker  . Smokeless tobacco: Never Used  Vaping Use  . Vaping Use: Never used  Substance and Sexual Activity  . Alcohol use: Not Currently    Comment: quit 1985, 2 fifths/ week,S/P rehab  . Drug use: Never  . Sexual activity: Not Currently  Other Topics Concern  . Not on file  Social History Narrative   ** Merged History Encounter **       Caffeine 2-3 cups average.  Widowed,  Lives at [facility] [place]. Living.  Retired Forensic psychologist.  One son.    Social Determinants of Health   Financial Resource Strain: Not on file  Food Insecurity: Not on file  Transportation Needs: Not on file  Physical Activity: Not on file  Stress: Not on file  Social Connections: Not on file  Intimate Partner Violence: Not on file    Family History:   Family History  Problem Relation Age of Onset  . Bone cancer Mother   . Hypertension Mother   . Arthritis Mother   . Clotting disorder Daughter        died of blood clot      ROS:  Unable to obtain due to altered mentation  Physical Exam/Data:   Vitals:   12/19/20 0800 12/19/20 0900 12/19/20 1000 12/19/20 1100  BP: 129/64 129/69 122/62 (!) 118/56  Pulse: (!) 107 (!) 113 (!) 119 (!) 107  Resp: 17 (!) 26 14 (!) 22  Temp: 99.5 F (37.5 C)     TempSrc: Axillary     SpO2: 94% 94% 95% 92%  Weight:      Height:        Intake/Output Summary (Last 24 hours) at 12/19/2020 1134 Last data filed at 12/19/2020 0657 Gross per 24 hour  Intake 1833.56 ml  Output -  Net 1833.56 ml   Last 3 Weights 12/19/2020 12/17/2020 10/13/2020  Weight (lbs) 127 lb 3.3 oz 140 lb 129 lb 9.6 oz  Weight (kg) 57.7 kg 63.504 kg 58.786 kg     Body mass index is 17.74 kg/m.  General: Debilitated appearing elderly WM in no acute distress. Head: Normocephalic, atraumatic, sclera non-icteric, no xanthomas, nares are without discharge. Neck: Negative for carotid bruits. JVP not elevated. Lungs: Coarse BS to auscultation without wheezes, rales, or rhonchi. Breathing is unlabored. Heart: Irregularly irregular, rate mildly elevated, S1 S2 without murmurs, rubs, or gallops.  Abdomen: Soft, non-tender, non-distended with normoactive bowel sounds. No rebound/guarding. Extremities: No clubbing or cyanosis. No edema. Distal pedal pulses are 2+ and equal bilaterally. Neuro: Recognizes son, thinks he's in [place], mumbling nonsensically Psych: Nonsensical   EKG:  The EKG was personally reviewed and demonstrates:  atrial fib  131bpm wtih RBBB, LAFB  Telemetry:  Telemetry was personally reviewed and demonstrates: atrial fib rates 98-1teens currently  Relevant CV Studies:  2D echo 12/2017 -  Left ventricle: The cavity size was normal. Systolic function was  vigorous. The estimated ejection fraction was in the range of 65%  to 70%. Images were inadequate for LV wall motion assessment.  Left ventricular diastolic function parameters were normal.  - Aortic valve: Poorly visualized. Trileaflet; normal thickness,  mildly calcified leaflets. There was trivial regurgitation.  - Mitral valve: Calcified annulus.  - Left atrium: The atrium was mildly dilated.  - Pulmonary arteries: Systolic pressure could not be accurately  estimated.    Laboratory Data:  High Sensitivity Troponin:  No results for input(s): TROPONINIHS in the last 720 hours.   Chemistry Recent Labs  Lab 12/17/20 1138 12/18/20 0425 12/19/20 0400  NA 147* 149* 147*  K 4.0 3.8 3.3*  CL 108 111 109  CO2 33* 26 28  GLUCOSE 145* 96 131*  BUN 43* 32* 20  CREATININE 1.13 0.84 0.79  CALCIUM 9.5 8.7* 8.3*  GFRNONAA >60 >60 >60  ANIONGAP 6 12 10     Recent Labs  Lab 12/17/20 1138 12/18/20 0425  PROT 7.0 6.0*  ALBUMIN 3.1* 2.6*  AST 54* 52*  ALT 33 34  ALKPHOS 81 82  BILITOT 0.8 0.7   Hematology Recent Labs  Lab 12/17/20 1138 12/18/20 0425 12/19/20 0400  WBC 22.9* 22.1* 20.8*  RBC 4.61 4.42 4.34  HGB 13.1 12.7* 12.5*  HCT 42.6 40.6 39.4  MCV 92.4 91.9 90.8  MCH 28.4 28.7 28.8  MCHC 30.8 31.3 31.7  RDW 14.6 14.7 14.6  PLT 262 294 358   BNPNo results for input(s): BNP, PROBNP in the last 168 hours.  DDimer No results for input(s): DDIMER in the last 168 hours.   Radiology/Studies:  DG Chest 1 View  Result Date: 12/18/2020 CLINICAL DATA:  Post thesis EXAM: CHEST  1 VIEW COMPARISON:  Portable exam 1356 hours compared to 12/17/2020 FINDINGS: Upper normal size of cardiac silhouette. Mediastinal contours and pulmonary  vascularity normal. Atherosclerotic calcification aorta. Partially loculated RIGHT pleural effusion again identified. No pneumothorax following thoracentesis. Persistent atelectasis versus infiltrate at medial LEFT lower lobe. Bones demineralized. IMPRESSION: No pneumothorax following thoracentesis. Persistent partially loculated RIGHT pleural effusion and LEFT basilar atelectasis versus infiltrate. Electronically Signed   By: Lavonia Dana M.D.   On: 12/18/2020 14:06   CT HEAD WO CONTRAST  Result Date: 12/17/2020 CLINICAL DATA:  UTI with AMS Patient had 75 cc of isvoue 300 at 1545 EXAM: CT HEAD WITHOUT CONTRAST TECHNIQUE: Contiguous axial images were obtained from the base of the skull through the vertex without intravenous contrast. COMPARISON:  09/01/2020 a lesion FINDINGS: Brain: No evidence of acute infarction, hemorrhage, hydrocephalus, extra-axial collection or mass lesion/mass effect. There is ventricular sulcal enlargement reflecting diffuse atrophy stable from the prior study. Mild patchy areas of white matter hypoattenuation are also present and stable consistent with chronic microvascular ischemic change. Vascular: No hyperdense vessel or unexpected calcification. Skull: Normal. Negative for fracture or focal lesion. Sinuses/Orbits: Globes and orbits are unremarkable. Visualized sinuses are essentially clear. Other: None. IMPRESSION: 1. No acute intracranial abnormalities. 2. Atrophy and mild chronic microvascular ischemic change stable from prior study. Electronically Signed   By: Lajean Manes M.D.   On: 12/17/2020 18:27   CT Chest W Contrast  Result Date: 12/17/2020 CLINICAL DATA:  Follow-up for abnormal chest radiograph. Patient history of confusion/altered mental status. History of gastrointestinal carcinoma. EXAM: CT CHEST WITH CONTRAST TECHNIQUE: Multidetector CT imaging of the chest was performed during intravenous contrast administration. CONTRAST:  63mL OMNIPAQUE IOHEXOL 300 MG/ML  SOLN  COMPARISON:  04/06/2020 and current chest radiograph FINDINGS: Cardiovascular: Heart is mildly enlarged. Dense three-vessel coronary artery calcifications. Ascending aorta dilated to 4.2 cm. Aortic atherosclerotic calcifications. Mediastinum/Nodes: No neck base, mediastinal or hilar masses or enlarged lymph nodes. Trachea is unremarkable esophagus is normal in caliber. Lungs/Pleura: Moderate partly loculated right pleural effusion. Loculated fluid is noted along the mid to upper oblique fissure. Bronchus intermedius is partly effaced. There is atelectasis a significant portion of the right lower lobe. Central opacity with mild bronchiectasis is noted in the posteroinferior right middle lobe. There is patchy opacity in a peribronchovascular distribution in the left lower lobe that may also be atelectasis, although has a configuration suggesting at least a component of pneumonia. 17 mm focus of ground-glass opacity is noted peripherally in the left upper lobe, centered on image 61, series 5. No evidence of pulmonary edema. No lung mass or suspicious nodule. No pneumothorax. Upper Abdomen: No acute findings. Status post cholecystectomy. Increase in colonic stool burden. Musculoskeletal: No fracture or acute finding.  No bone lesion. IMPRESSION: 1. Patchy peribronchovascular opacity in the left lower suspicious for pneumonia. 2. Small to moderate-sized partly loculated right pleural effusion. Significant atelectasis of the right lower lobe, with a smaller area of atelectasis/scarring of the right middle lobe. 3. Small focus of ground-glass opacity in the peripheral left upper lobe. This may reflect additional infection. It also qualifies as a ground-glass nodule in follow-up is recommended. Initial follow-up with CT at 6-12 months is recommended to confirm persistence. If persistent, repeat CT is recommended every 2 years until 5 years of stability has been established. This recommendation follows the consensus  statement: Guidelines for Management of Incidental Pulmonary Nodules Detected on CT Images: From the Fleischner Society 2017; Radiology 2017; 284:228-243. 4. Mild cardiomegaly. Dense three-vessel coronary artery calcifications. Aortic atherosclerosis. 5. Ascending aorta dilated to 4.1 cm. Recommend annual imaging followup by CTA or MRA. This recommendation follows 2010 ACCF/AHA/AATS/ACR/ASA/SCA/SCAI/SIR/STS/SVM Guidelines for the Diagnosis and Management of Patients with Thoracic Aortic Disease. Circulation. 2010; [id number]. Aortic aneurysm NOS (ICD10-I71.9) Aortic Atherosclerosis (ICD10-I70.0). Electronically Signed   By: Lajean Manes M.D.   On: 12/17/2020 16:25   DG Chest Port 1 View  Result Date: 12/17/2020 CLINICAL DATA:  87 year old male with altered mental status. Confusion. History of gastrointestinal cancer stated on prior CT Chest, Abdomen, and Pelvis - most recently 04/06/2020. EXAM: PORTABLE CHEST 1 VIEW COMPARISON:  Portable chest 09/11/2020 and earlier, including chest CT 04/26/2020. FINDINGS: Portable AP semi upright view at 1141 hours. Moderate veiling opacity in the right lower lung is new since October. Obscured right hemidiaphragm. Lung volumes appear stable. Mediastinal contours appear stable. Allowing for portable technique the left lung is clear. No pneumothorax or pulmonary edema. Visualized tracheal air column is within normal limits. Stable surgical clips in the right upper quadrant. Chronic right lateral rib fractures appear stable. IMPRESSION: 1. Moderate size new veiling opacity in the right lower lung. This could be a moderate size right pleural effusion with atelectasis, or a combination of pleural fluid with pneumonia or new lung mass (history of GI stromal tumor). Chest CT (IV contrast preferred) would best characterize further. 2. Left lung remains clear. Electronically Signed   By: Genevie Ann M.D.   On: 12/17/2020 11:59   US THORACENTESIS ASP PLEURAL SPACE W/IMG  GUIDE  Result Date: 12/18/2020 INDICATION: Patient with history of pneumonia, encephalopathy, atrial fibrillation, loculated right pleural effusion; request received for diagnostic and therapeutic right thoracentesis. EXAM: ULTRASOUND GUIDED DIAGNOSTIC AND  THERAPEUTIC RIGHT THORACENTESIS MEDICATIONS: 1% lidocaine to skin and subcutaneous tissue COMPLICATIONS: None immediate. PROCEDURE: An ultrasound guided thoracentesis was thoroughly discussed with the patient's son and questions answered. The benefits, risks, alternatives and complications were also discussed. The patient's son understands and wishes to proceed with the procedure. Written consent was obtained. Ultrasound was performed to localize and mark an adequate pocket of fluid in the right chest. The area was then prepped and draped in the normal sterile fashion. 1% Lidocaine was used for local anesthesia. Under ultrasound guidance a 6 Fr Safe-T-Centesis catheter was introduced. Thoracentesis was performed. The catheter was removed and a dressing applied. FINDINGS: A total of approximately 225 cc of hazy,amber fluid was removed. Samples were sent to the laboratory as requested by the clinical team. The pleural effusion was multiloculated by ultrasound today. IMPRESSION: Successful ultrasound guided diagnostic and therapeutic right thoracentesis yielding 225 cc of pleural fluid. Read by: Rowe Robert, PA-C Electronically Signed   By: Sandi Mariscal M.D.   On: 12/18/2020 13:35     Assessment and Plan:   1. Sepsis due to LLL PNA with loculated pleural effusion and concomitant metabolic encephalopathy with ongoing confusion  - per primary team  2. Persistent atrial fibrillation - baseline home HR was 100bpm. He is presently hovering around 105-110 which is actually quite reasonable given his ongoing acute illness otherwise - would continue diltiazem drip while acutely ill given ability to titrate if needed based on clinical deterioration/improvement,  with eye towards resuming home dose prior to discharge - can use metoprolol IV PRN if HR becomes an issue as long as BP is OK - avoid agitation as this can cause RVR and treat underlying issues - anticoagulation on board already  3. Coronary/aortic atherosclerosis with 4.1cm dilated ascending aorta on CT - incidental pickup, negative troponin - no ischemic symptoms - anticipate conservative management - can defer to OP setting to decide whether follow-up scanning is appropriate  4. Hypernatremia/hypokalemia/hypocalcemia - lyte mgmt per IM  5. RBBB+LAFB - no evidence of significant bradycardia at this time    CHA2DS2-VASc Score = 3  This indicates a 3.2% annual risk of stroke. The patient's score is based upon: CHF History: No HTN History: No Diabetes History: No Stroke History: No Vascular Disease History: Yes by CT Age Score: 2 Gender Score: 0       For questions or updates, please contact [place] Please consult [email or web address] for contact info under    Signed, Charlie Pitter, PA-C  12/19/2020 11:34 AM   Patient seen and examined and agree with Melina Copa, PA-C as detailed above.  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  Currently, patient remains altered with mitts in place. HR much better controlled on Dilt gtt. Baseline HR around 100bpm in Afib.  Suspect the primary driver of patient's RVR is underlying pneumonia and rates will improve with stabilization from infectious standpoint. Would continue dilt gtt for now and metop as needed. Likely can transition back to home dosing once more  stable. Continue apixaban for anticoagulation.  Exam: GEN: Elderly male, confused and mumbling, mitts in place  Neck: No  JVD Cardiac: Irregularly irregular Respiratory: Diminshed breath sounds at left lung base. Otherwise coarse but no wheezing appreciated GI: Soft, nontender, non-distended  MS: No edema; No deformity. Neuro:  Confused but moving extremities Psych: Unable to assess  Plan: -Continue dilt gtt for rate control for now while patient is altered and not taking much PO -Can use metop prn as needed for episodes of RVR vs amiodarone if blood pressure soft -Once more stable from an infectious standpoint, plan to transition back to home dilt 120mg  BID as this had him controlled in the past -Management of sepsis secondary to pneumonia per primary team  We will follow peripherally. Please call with questions or concerns.  Gwyndolyn Kaufman, MD

## 2020-12-19 NOTE — Evaluation (Addendum)
Clinical/Bedside Swallow Evaluation Patient Details  Name: Casey Gates. MRN: [id number] Date of Birth: 1926-01-10  Today's Date: 12/19/2020 Time: SLP Start Time (ACUTE ONLY): 1245 SLP Stop Time (ACUTE ONLY): 1301 SLP Time Calculation (min) (ACUTE ONLY): 16 min  Past Medical History:  Past Medical History:  Diagnosis Date  . Anemia   . Asthma    AS A CHILD  . Blind left eye   . Cancer [facility])    PROSTATE CANCER  . Cellulitis    frequently, "all over"  . Chronic pain syndrome   . Chronic ulcer of toe, right, with unspecified severity ([place])    2nd toe  . Constipation   . DDD (degenerative disc disease), lumbar   . Gastrointestinal stromal tumor (GIST) of stomach ([place])   . GI bleeding   . History of blood transfusion    01/2018  . Insomnia   . Kyphoscoliosis   . Mass in the abdomen 01/2018  . Persistent atrial fibrillation ([place])   . Restless leg   . Sleep apnea    does not wear CPAP  . Wears glasses    Past Surgical History:  Past Surgical History:  Procedure Laterality Date  . AMPUTATION TOE Right 11/05/2018   Procedure: Right 2nd toe ampuation;  Surgeon: Wylene Simmer, MD;  Location: [place];  Service: Orthopedics;  Laterality: Right;  61min  . CHOLECYSTECTOMY    . COLONOSCOPY    . ESOPHAGOGASTRODUODENOSCOPY (EGD) WITH PROPOFOL Left 02/16/2018   Procedure: ESOPHAGOGASTRODUODENOSCOPY (EGD) WITH PROPOFOL;  Surgeon: [name], MD;  Location: [place];  Service: Gastroenterology;  Laterality: Left;  . LAPAROSCOPIC GASTRECTOMY N/A 03/12/2018   Procedure: LAPAROSCOPIC ASSISTED PARTIAL GASTRECTOMY ERAS PATHWAY;  Surgeon: Stark Klein, MD;  Location: [place];  Service: General;  Laterality: N/A;  . LAPAROSCOPIC PARTIAL GASTRECTOMY  03/12/2018   LAPAROSCOPIC ASSISTED PARTIAL GASTRECTOMY ERAS PATHWAY  . PROSTATECTOMY    . QUADRICEPS TENDON REPAIR Left 07/07/2017   Procedure: REPAIR QUADRICEP TENDON;  Surgeon: Rod Can, MD;  Location: [place];  Service:  Orthopedics;  Laterality: Left;  . RIGHT CATARACT EXTRACTION  2013  . RIGTH NEPHRECTOMY  2006  . TONSILLECTOMY    . TRANSURETHRAL RESECTION OF PROSTATE  [id number]   HPI:  87 year old male with medical history of gastric cancer, afib on anticoagulation, OSA, and chronic pain syndrome, COVID previously.   He presented to the ED due to AMS. CXR in the ED showed RLL opacity. CT chest showed patchy peribronchovascular opacity suspicious for PNA, small to moderate-sized partly loculated R pleural effusion, and significant atelectasis of the RLL, with a smaller area of atelectasis/scarring of the R middle lobe - s/p thoracentesis.  Pt with decreased attention/cognition per son's information shared with MD.  He has h/o dysphagia dating back to 2014- with rec for double swallows, chin tuck due to decreased UES opening. Esophagram completed showed UES spasm, persistent tertiary contractions and hiatal hernia. Swallow eval ordered.   Assessment / Plan / Recommendation Clinical Impression  Pt has h/o dysphagia dating back to 2014 with UES spasm thus decreased opening, esophageal tertiary contractions. Today he is xerostomic with coating on tongue and secretions near faucial pillar/soft palate. Decreased attention to task with pt moaning and stating he wants his "head down". SLP advised need to sit up for po intake and would reposition him for comfort following.    Limited PO of water, Ensure, ice cream and graham cracker boluses provided. Weak cough noted post swallow of thin liquids and prolonged  oral mastication with solids.  No indication of aspiration with nectar nor icecream. Pt is dysarthric and grossly weak and RN reports his intake is poor.   To mitigate aspiration, recommend to modify diet to dys3/nectar (nectar with meals) and allow thin water between meals.  Stay upright after meals as much as able.    Will follow up for dysphagia management, instrumental swallow evaluation indication.  Given pt's  advanced age, gastric tumor hx, current medical condition and h/o dysphagia, MBS would likey not change outcomes. Will follow up. SLP Visit Diagnosis: Dysphagia, oropharyngeal phase (R13.12);Dysphagia, pharyngoesophageal phase (R13.14)    Aspiration Risk  Moderate aspiration risk    Diet Recommendation Dysphagia 3 (Mech soft);Nectar-thick liquid;Free water protocol after oral care   Liquid Administration via: Cup Medication Administration: Whole meds with puree Supervision: Full supervision/cueing for compensatory strategies Compensations: Slow rate;Small sips/bites Postural Changes: Remain upright for at least 30 minutes after po intake;Seated upright at 90 degrees    Other  Recommendations Oral Care Recommendations: Oral care QID (oral suction after meals)   Follow up Recommendations        Frequency and Duration min 1 x/week  1 week       Prognosis Prognosis for Safe Diet Advancement: Guarded Barriers to Reach Goals: Time post onset      Swallow Study   General Date of Onset: 12/19/20 HPI: 87 year old male with medical history of gastric cancer, afib on anticoagulation, OSA, and chronic pain syndrome, COVID previously.   He presented to the ED due to AMS. CXR in the ED showed RLL opacity. CT chest showed patchy peribronchovascular opacity suspicious for PNA, small to moderate-sized partly loculated R pleural effusion, and significant atelectasis of the RLL, with a smaller area of atelectasis/scarring of the R middle lobe - s/p thoracentesis.  Pt with decreased attention/cognition per son's information shared with MD.  He has h/o dysphagia dating back to 2014- with rec for double swallows, chin tuck due to decreased UES opening. Esophagram completed showed UES spasm, persistent tertiary contractions and hiatal hernia. Swallow eval ordered. Type of Study: Bedside Swallow Evaluation Previous Swallow Assessment: see hpi Diet Prior to this Study: Regular;Thin liquids Temperature  Spikes Noted: No Respiratory Status: Room air History of Recent Intubation: No Behavior/Cognition: Alert Oral Cavity Assessment: Dry;Dried secretions Oral Care Completed by SLP: No Oral Cavity - Dentition: Adequate natural dentition Vision: Functional for self-feeding Self-Feeding Abilities: Needs assist Patient Positioning: Upright in bed Baseline Vocal Quality: Low vocal intensity Volitional Cough: Cognitively unable to elicit Volitional Swallow: Unable to elicit    Oral/Motor/Sensory Function Overall Oral Motor/Sensory Function: Other (comment) (? right facial asymmetry?, severe dysarthria)   Ice Chips Ice chips: Not tested   Thin Liquid Thin Liquid: Impaired Presentation: Cup;Straw;Spoon Pharyngeal  Phase Impairments: Cough - Immediate;Throat Clearing - Delayed    Nectar Thick Nectar Thick Liquid: Impaired Presentation: Cup Pharyngeal Phase Impairments: Suspected delayed Swallow   Honey Thick Honey Thick Liquid: Not tested   Puree Puree: Within functional limits Presentation: Spoon   Solid     Solid: Impaired Pharyngeal Phase Impairments: Suspected delayed Swallow      [name] 12/19/2020,2:32 PM   [name], MS [facility] SLP [place] Office [phone] Pager [phone]

## 2020-12-19 NOTE — Progress Notes (Signed)
Occupational Therapy Evaluation  Per chart review patient resides at ALF and ambulates with rolling walker. Patient is disoriented to situation, time unable to provide PLOF information. Patient initially stating he wants to drink juice and appears agreeable to sitting up at EOB to do so. Patient required max A x2 and with posterior + R lateral lean stating "lay me down." Attempted to reassure and redirect patient to taking sips of juice with max assist however patient continuing to try and lay himself back onto the bed requiring mod A x2 to complete safely. Recommend continued acute OT services to maximize patient activity tolerance, strength, balance, safety awareness in order to facilitate D/C to venue listed below.    12/19/20 1100  OT Visit Information  Last OT Received On 12/19/20  Assistance Needed +2  PT/OT/SLP Co-Evaluation/Treatment Yes  Reason for Co-Treatment For patient/therapist safety;To address functional/ADL transfers  OT goals addressed during session ADL's and self-care  History of Present Illness Patient is a 87 year old male PMH of A. fib, CKD, chronic pain, OSA (does not use BiPAP) who presented to the hospital 12/17/2020 by son for altered mental status. Per chart had COVID end of Dec 2021  Precautions  Precautions Fall  Precaution Comments mits, VS  Restrictions  Weight Bearing Restrictions No  Home Living  Family/patient expects to be discharged to: [place] Other (Comment)  Available Help at Discharge Personal care attendant  Additional Comments From ALF  Prior Function  Comments ambulates with RW at baseline per chart/per son  Communication  Communication No difficulties  Pain Assessment  Pain Assessment Faces  Faces Pain Scale 2  Pain Location unspecified  Pain Descriptors / Indicators Moaning  Pain Intervention(s) Monitored during session  Cognition  Arousal/Alertness Awake/alert  Behavior During Therapy  Anxious;Agitated  Overall Cognitive Status No family/caregiver present to determine baseline cognitive functioning  Area of Impairment Orientation;Attention;Memory;Following commands;Safety/judgement;Awareness  Orientation Level Time;Situation  Current Attention Level Focused  Memory Decreased short-term memory  Following Commands Follows one step commands inconsistently  Safety/Judgement Decreased awareness of safety;Decreased awareness of deficits  Awareness Intellectual  General Comments difficult to have patient focus on sitting activity. patient kept trying to  lie back down  Upper Extremity Assessment  Upper Extremity Assessment Generalized weakness  Lower Extremity Assessment  Lower Extremity Assessment Defer to PT evaluation  ADL  Overall ADL's  Needs assistance/impaired  Eating/Feeding Maximal assistance;Sitting  Eating/Feeding Details (indicate cue type and reason) pt with B mits however kept trying to lay himself down from EOB while drinking juice. patient verbalizes wanting to drink juice however also insisting on laying down requiring max encouragement  Grooming Maximal assistance  Upper Body Bathing Maximal assistance  Lower Body Bathing Total assistance  Upper Body Dressing  Total assistance  Lower Body Dressing Total assistance  Toilet Transfer Details (indicate cue type and reason) deferred d/t poor sitting balance and cognition  Toileting- Clothing Manipulation and Hygiene Total assistance  Functional mobility during ADLs Maximal assistance;+2 for physical assistance;+2 for safety/equipment  General ADL Comments pt requiring significant assistance with all self care due to decreased cognition, safety awareness, balance, activity tolerance  Bed Mobility  Overal bed mobility Needs Assistance  Bed Mobility Supine to Sit;Sit to Supine  Supine to sit Max assist;+2 for safety/equipment;+2 for physical assistance  Sit to supine Mod assist;+2 for safety/equipment  General  bed mobility comments with much encouragement , assisted to sitting on bed edge. Patient leaning backward, trying to lie down. patient  did not support self. Patient  placed legs onto bed with min assistance.  Transfers  General transfer comment deferred d/t safety and poor sitting balance  Balance  Overall balance assessment Needs assistance  Sitting-balance support Feet unsupported;No upper extremity supported  Sitting balance-Leahy Scale Zero  Sitting balance - Comments patient restless, not following directions  Postural control Posterior lean;Left lateral lean  OT - End of Session  Activity Tolerance Treatment limited secondary to agitation;Other (comment) (cognition)  Patient left in bed;with call bell/phone within reach;with bed alarm set (mits on)  Nurse Communication Mobility status  OT Assessment  OT Recommendation/Assessment Patient needs continued OT Services  OT Visit Diagnosis Other abnormalities of gait and mobility (R26.89);Muscle weakness (generalized) (M62.81);Other symptoms and signs involving cognitive function  OT Problem List Decreased strength;Decreased activity tolerance;Impaired balance (sitting and/or standing);Decreased cognition;Decreased safety awareness  OT Plan  OT Frequency (ACUTE ONLY) Min 2X/week  OT Treatment/Interventions (ACUTE ONLY) Self-care/ADL training;Therapeutic exercise;Therapeutic activities;Cognitive remediation/compensation;Patient/family education;Balance training  AM-PAC OT "6 Clicks" Daily Activity Outcome Measure (Version 2)  Help from another person eating meals? 2  Help from another person taking care of personal grooming? 2  Help from another person toileting, which includes using toliet, bedpan, or urinal? 1  Help from another person bathing (including washing, rinsing, drying)? 1  Help from another person to put on and taking off regular upper body clothing? 1  Help from another person to put on and taking off regular lower body  clothing? 1  6 Click Score 8  OT Recommendation  Follow Up Recommendations SNF  OT Equipment None recommended by OT  Individuals Consulted  Consulted and Agree with Results and Recommendations Patient  Acute Rehab OT Goals  Patient Stated Goal drink juice  OT Goal Formulation With patient  Time For Goal Achievement 01/02/21  Potential to Achieve Goals Fair  OT Time Calculation  OT Start Time (ACUTE ONLY) 0836  OT Stop Time (ACUTE ONLY) 0849  OT Time Calculation (min) 13 min  OT General Charges  $OT Visit 1 Visit  OT Evaluation  $OT Eval Low Complexity 1 Low  Written Expression  Dominant Hand  (unable to specify)   Delbert Phenix OT OT pager: [phone]

## 2020-12-19 NOTE — TOC Initial Note (Signed)
Transition of Care (TOC) - Initial/Assessment Note    Patient Details  Name: Casey Gates. MRN: [id number] Date of Birth: Aug 28, 1926  Transition of Care [facility]) CM/SW Contact:    Leeroy Cha, RN Phone Number: 12/19/2020, 8:27 AM  Clinical Narrative:                 87 y.o. male with hx history of A. fib, CKD, chronic pain, OSA (does not use BiPAP) who presented to the hospital 12/17/2020 by son for altered mental status.  At the time exam, patient is confused, most difficult to obtain complete and thorough HPI review of systems from patient.  41 of history obtained from patient's son bedside.  The patient's son states that he was called by the patient's nursing facility on Thursday that he was altered.  However the patient's son was being quarantined with COVID and could not see him until today, 12/17/2020.  Nursing facility with concern for UTI however they did not do any further testing.  When the son saw the patient, he definitely thought he was off from his baseline.  Thus he brought the patient in for evaluation.  The son denies any recent changes in meds.  He typically gets around with a walker but has been weaker.  He did have COVID in the end of December however was fairly asymptomatic.  He also has not been eating as well.  ED course: -Initial presentation, afebrile, heart rate 138, blood pressure 103/76, respiratory rate 20. -Relevant labs on initial admission: Sodium 147, potassium 4, chloride 108, bicarb 33, glucose 245, BUN 43, creatinine 1.13, lactic acid 1.6, WBC 22.9, hemoglobin 13.1 -Chest x-ray showed new right lower lung vague opacity - EKG showed A. fib with RVR -Patient was given fluid, azithromycin, Rocephin, placed on a Cardizem drip and the hospitalist service was contacted for further evaluation management.     ROS:   Unable to fully obtain due to patient's status PLAN IS TO RETURN TO HOME AS BEFORE EPISODE FOLLOWING FOR  PROGRESSION. Expected Discharge Plan: Home/Self Care Barriers to Discharge: Continued Medical Work up   Patient Goals and CMS Choice Patient states their goals for this hospitalization and ongoing recovery are:: UNABLE TO STATE      Expected Discharge Plan and Services Expected Discharge Plan: Home/Self Care   Discharge Planning Services: CM Consult   Living arrangements for the past 2 months: Apartment                                      Prior Living Arrangements/Services Living arrangements for the past 2 months: Apartment Lives with:: Spouse,Adult Children Patient language and need for interpreter reviewed:: Yes        Need for Family Participation in Patient Care: Yes (Comment) Care giver support system in place?: Yes (comment)   Criminal Activity/Legal Involvement Pertinent to Current Situation/Hospitalization: No - Comment as needed  Activities of Daily Living Home Assistive Devices/Equipment: Eyeglasses,Blood pressure cuff,Grab bars around toilet,Grab bars in shower,Hand-held shower hose,Walker (specify type),Scales,Other (Comment) (rolling walker, transport chair) ADL Screening (condition at time of admission) Patient's cognitive ability adequate to safely complete daily activities?: No Is the patient deaf or have difficulty hearing?: Yes Does the patient have difficulty seeing, even when wearing glasses/contacts?: No Does the patient have difficulty concentrating, remembering, or making decisions?: Yes Patient able to express need for assistance with ADLs?: Yes Does the patient have  difficulty dressing or bathing?: Yes Independently performs ADLs?: No Communication: Independent Dressing (OT): Dependent Is this a change from baseline?: Change from baseline, expected to last >3 days Grooming: Dependent Is this a change from baseline?: Change from baseline, expected to last >3 days Feeding: Dependent Is this a change from baseline?: Change from baseline,  expected to last >3 days Bathing: Dependent Is this a change from baseline?: Change from baseline, expected to last >3 days Toileting: Dependent Is this a change from baseline?: Change from baseline, expected to last >3days In/Out Bed: Dependent Is this a change from baseline?: Change from baseline, expected to last >3 days Walks in Home: Dependent Is this a change from baseline?: Change from baseline, expected to last >3 days Does the patient have difficulty walking or climbing stairs?: Yes (secondary to weakness) Weakness of Legs: Both Weakness of Arms/Hands: Both  Permission Sought/Granted                  Emotional Assessment Appearance:: Appears stated age Attitude/Demeanor/Rapport: Unable to Assess Affect (typically observed): Unable to Assess Orientation: : Fluctuating Orientation (Suspected and/or reported [place]) Alcohol / Substance Use: Not Applicable Psych Involvement: No (comment)  Admission diagnosis:  Lung mass [R91.8] SOB (shortness of breath) [R06.02] S/P thoracentesis [Z98.890] Atrial fibrillation with RVR ([place]) [I48.91] Patient Active Problem List   Diagnosis Date Noted  . Atrial fibrillation with RVR ([place]) 12/17/2020  . Acute metabolic encephalopathy 57/84/6962  . Hypernatremia 12/17/2020  . Coagulation defect ([place]) 09/27/2020  . Back pain, chronic 12/14/2018  . Edema 12/14/2018  . Prostate cancer ([place]) 12/14/2018  . History of amputation of lesser toe ([place]) 11/16/2018  . Chronic ulcer of right foot with fat layer exposed ([place]) 11/05/2018  . Contusion of right hip region 07/24/2018  . Multifocal pneumonia 03/18/2018  . Sepsis ([place]) 03/18/2018  . Malignant gastrointestinal stromal tumor (GIST) of stomach ([place]) 03/12/2018  . Urinary retention 02/24/2018  . Gastrointestinal stromal tumor (GIST) of stomach ([place])   . Restless leg syndrome   . Acute GI bleeding 02/16/2018  . Acute blood loss anemia 02/16/2018  . Chronic low back pain 02/05/2018   . Degeneration of lumbar intervertebral disc 02/05/2018  . Constipation 01/20/2018  . Exertional shortness of breath 12/30/2017  . Rupture of left quadriceps muscle 07/07/2017  . Rupture of left quadriceps tendon 06/26/2017  . Eczema 06/26/2017  . Chest pain 07/29/2015  . OSA on CPAP 07/10/2015  . CPAP use counseling 07/10/2015  . Excessive daytime sleepiness 02/22/2015  . Sleep apnea 02/22/2015  . Snoring 02/22/2015  . Nocturia more than twice per night 02/22/2015  . Syncope 12/31/2012  . Bifascicular bundle branch block 12/31/2012    Class: Acute  . Cellulitis and abscess of leg, except foot 12/04/2012  . Chronic pain 12/04/2012  . Kyphosis 12/04/2012  . H/O unilateral nephrectomy 12/04/2012  . H/O prostate cancer 12/04/2012  . S/P TURP 12/04/2012   PCP:  Velna Hatchet, MD Pharmacy:   [id number] [place], [place] - [place] [address] [place] [address] Phone: [phone] Fax: [place], [place] - [address] [place] [address] Phone: [phone] Fax: [phone]  [place], [place] - [place] [address] Phone: [phone] Fax: [phone]     Social Determinants of Health (SDOH) Interventions  Readmission Risk Interventions No flowsheet data found.

## 2020-12-19 NOTE — Progress Notes (Signed)
PROGRESS NOTE    [name].  QIH:[id number] DOB: June 15, 1926 DOA: 12/17/2020 PCP: Velna Hatchet, MD    Chief Complaint  Patient presents with  . Urinary Tract Infection    Brief Narrative:   87 year old gentleman prior history of atrial fibrillation on anticoagulation, obstructive sleep apnea, chronic pain syndrome presents to ED for altered mental status. On arrival to ED he was afebrile, tachycardic chest x-ray showed right lower lung opacity.  CT of the chest with contrast shows Patchy peribronchovascular opacity in the left lower suspicious for pneumonia. Small to moderate-sized partly loculated right pleural effusion. Significant atelectasis of the right lower lobe, with a smaller area of atelectasis/scarring of the right middle lobe.  He was started on IV Rocephin and Zithromax. IR consulted and he underwent thoracentesis , 250 ml of fluid taken out. He was found to have loculated pleural effusion. As per lights criteria, its exudative effusion.  Pt see and examined at bedside, he continues to be confused, tachycardic, and is on cardizem gtt at 15mg  /hr .  Discussed the plan with the son over the phone.    Assessment & Plan:   Active Problems:   Atrial fibrillation with RVR (HCC)   Acute metabolic encephalopathy   Hypernatremia   Sepsis secondary to left lower lobe pneumonia On arrival to ED patient was tachycardic, WBC count around 22,000 and CT chest shows left lower lobe pneumonia. Lactic acid within normal limits. Started on IV Rocephin and IV Zithromax. Continue the same.  Nasal cannula oxygen to keep sats greater than 90%.     Atrial fibrillation with RVR Probably driven by respiratory issues and pneumonia Rate controlled with Cardizem gtt. and on Eliquis for anticoagulation. He remains on cardizem gtt .  Cardiology consulted for recommendations.     Hypernatremia probably secondary to poor oral intake and free water deficit Improving with IV fluids.      Acute metabolic encephalopathy probably secondary to hypernatremia and pneumonia. Slight improvement in the mental status today, alert and answering simple questions.     Right moderate loculated pleural effusion Ultrasound thoracentesis ordered, removed 225 ml of hazy amber fluid, LDH is 1318, appears to be exudative.   Incentive spirometry will be ordered. [place] oxygen to keep sats greater than 90%. Cytology of the pleural fluid and pleural fluid cultures have been ordered.    Dietary consulted for nutrition.  OSA:  ? CPAP.    Hypokalemia Replaced   In view of his advance age, pneumonia, multiple medical problems, poor oral intake, recommend palliative care for goals of care discussion. He is currently DNR, but son is open to cardio version if he needs it.      DVT prophylaxis: Eliquis Code Status: DNR Family Communication: Discussed with son  and granddaughter over the phone Disposition:   Status is: Inpatient  Remains inpatient appropriate because:Ongoing diagnostic testing needed not appropriate for outpatient work up, Unsafe d/c plan, IV treatments appropriate due to intensity of illness or inability to take PO and Inpatient level of care appropriate due to severity of illness   Dispo: The patient is from: SNF              Anticipated d/c is to: SNF              Anticipated d/c date is: > 3 days              Patient currently is not medically stable to d/c.       Consultants:  Palliative care consult  Cardiology consult.   Procedures: Ultrasound thoracentesis ordered  Antimicrobials: IV Rocephin and Zithromax since admission   Subjective: ALERT, opening eyes to verbal cues, and answered a few questions.   Objective: Vitals:   12/19/20 0800 12/19/20 0900 12/19/20 1000 12/19/20 1100  BP: 129/64 129/69 122/62 (!) 118/56  Pulse: (!) 107 (!) 113 (!) 119 (!) 107  Resp: 17 (!) 26 14 (!) 22  Temp: 99.5 F (37.5 C)     TempSrc: Axillary      SpO2: 94% 94% 95% 92%  Weight:      Height:        Intake/Output Summary (Last 24 hours) at 12/19/2020 1135 Last data filed at 12/19/2020 0657 Gross per 24 hour  Intake 1833.56 ml  Output --  Net 1833.56 ml   Filed Weights   12/17/20 2323 12/19/20 0635  Weight: 63.5 kg 57.7 kg    Examination:  General exam: elderly gentleman, not in distress on RA.  Respiratory system: Diminished air entry at bases, no wheezing or rhonchi Cardiovascular system: S1-S2 heard, irregularly irregular, tachycardic, no JVD or pedal edema. Gastrointestinal system: Abdomen is soft, nontender nondistended bowel sounds normal Central nervous system: Patient is alert and oriented to person only confused, able to move all extremities spontaneously Extremities: No pedal edema Skin: No rashes seen  psychiatry: Could not be assessed, confused    Data Reviewed: I have personally reviewed following labs and imaging studies  CBC: Recent Labs  Lab 12/17/20 1138 12/18/20 0425 12/19/20 0400  WBC 22.9* 22.1* 20.8*  NEUTROABS 20.1*  --  17.7*  HGB 13.1 12.7* 12.5*  HCT 42.6 40.6 39.4  MCV 92.4 91.9 90.8  PLT 262 294 358    Basic Metabolic Panel: Recent Labs  Lab 12/17/20 1138 12/18/20 0425 12/19/20 0400  NA 147* 149* 147*  K 4.0 3.8 3.3*  CL 108 111 109  CO2 33* 26 28  GLUCOSE 145* 96 131*  BUN 43* 32* 20  CREATININE 1.13 0.84 0.79  CALCIUM 9.5 8.7* 8.3*  MG  --  2.2  --     GFR: Estimated Creatinine Clearance: 48.1 mL/min (by C-G formula based on SCr of 0.79 mg/dL).  Liver Function Tests: Recent Labs  Lab 12/17/20 1138 12/18/20 0425  AST 54* 52*  ALT 33 34  ALKPHOS 81 82  BILITOT 0.8 0.7  PROT 7.0 6.0*  ALBUMIN 3.1* 2.6*    CBG: No results for input(s): GLUCAP in the last 168 hours.   Recent Results (from the past 240 hour(s))  Culture, blood (routine x 2)     Status: None (Preliminary result)   Collection Time: 12/17/20 10:20 AM   Specimen: BLOOD  Result Value Ref  Range Status   Specimen Description   Final    BLOOD BLOOD RIGHT FOREARM Performed at [facility], [address] [place], [place] [address]    Special Requests   Final    BOTTLES DRAWN AEROBIC AND ANAEROBIC Blood Culture adequate volume Performed at [facility], [address] [place], [place] [address]    Culture   Final    NO GROWTH 2 DAYS Performed at [facility], [address] [place], [place] [address]    Report Status PENDING  Incomplete  Culture, blood (routine x 2)     Status: None (Preliminary result)   Collection Time: 12/17/20 10:25 AM   Specimen: BLOOD  Result Value Ref Range Status   Specimen Description  Final    BLOOD BLOOD LEFT FOREARM Performed at [place] [address] [place], [place] [address]    Special Requests   Final    BOTTLES DRAWN AEROBIC AND ANAEROBIC Blood Culture results may not be optimal due to an inadequate volume of blood received in culture bottles Performed at [facility] [address] [place], [place] [address]    Culture   Final    NO GROWTH 2 DAYS Performed at [facility] [address] [place], [place] [address]    Report Status PENDING  Incomplete  SARS CORONAVIRUS 2 (TAT 6-24 HRS) Nasopharyngeal Nasopharyngeal Swab     Status: None   Collection Time: 12/17/20  3:02 PM   Specimen: Nasopharyngeal Swab  Result Value Ref Range Status   SARS Coronavirus 2 NEGATIVE NEGATIVE Final    Comment: (NOTE) SARS-CoV-2 target nucleic acids are NOT DETECTED.  The SARS-CoV-2 RNA is generally detectable in upper and lower respiratory specimens during the acute phase of infection. Negative results do not preclude SARS-CoV-2 infection, do not rule out co-infections with other pathogens, and should not be used as the sole basis for treatment or other patient management decisions. Negative results must be combined with clinical  observations, patient history, and epidemiological information. The expected result is Negative.  Fact Sheet for Patients: SugarRoll.be  Fact Sheet for Healthcare Providers: https://www.woods-mathews.com/  This test is not yet approved or cleared by the [place] FDA and  has been authorized for detection and/or diagnosis of SARS-CoV-2 by FDA under an Emergency Use Authorization (EUA). This EUA will remain  in effect (meaning this test can be used) for the duration of the COVID-19 declaration under Se ction 564(b)(1) of the Act, 21 U.S.C. section 360bbb-3(b)(1), unless the authorization is terminated or revoked sooner.  Performed at [facility], [place] [address] [place], [place] [address]   Body fluid culture     Status: None (Preliminary result)   Collection Time: 12/18/20  1:27 PM   Specimen: PATH Cytology Pleural fluid  Result Value Ref Range Status   Specimen Description   Final    PLEURAL Performed at [place] [address], [place], [place] [address]    Special Requests   Final    NONE Performed at [facility], [place] [address], [place], [place] [address]    Gram Stain NO WBC SEEN NO ORGANISMS SEEN   Final   Culture   Final    NO GROWTH < 24 HOURS Performed at [facility], [place] [address], [place],  [address]    Report Status PENDING  Incomplete         Radiology Studies: DG Chest 1 View  Result Date: 12/18/2020 CLINICAL DATA:  Post thesis EXAM: CHEST  1 VIEW COMPARISON:  Portable exam 1356 hours compared to 12/17/2020 FINDINGS: Upper normal size of cardiac silhouette. Mediastinal contours and pulmonary vascularity normal. Atherosclerotic calcification aorta. Partially loculated RIGHT pleural effusion again identified. No pneumothorax following thoracentesis. Persistent atelectasis versus infiltrate at medial LEFT lower lobe. Bones demineralized.  IMPRESSION: No pneumothorax following thoracentesis. Persistent partially loculated RIGHT pleural effusion and LEFT basilar atelectasis versus infiltrate. Electronically Signed   By: Lavonia Dana M.D.   On: 12/18/2020 14:06   CT HEAD WO CONTRAST  Result Date: 12/17/2020 CLINICAL DATA:  UTI with AMS Patient had 75 cc of isvoue 300 at 1545 EXAM: CT HEAD WITHOUT CONTRAST TECHNIQUE: Contiguous axial images were  obtained from the base of the skull through the vertex without intravenous contrast. COMPARISON:  09/01/2020 a lesion FINDINGS: Brain: No evidence of acute infarction, hemorrhage, hydrocephalus, extra-axial collection or mass lesion/mass effect. There is ventricular sulcal enlargement reflecting diffuse atrophy stable from the prior study. Mild patchy areas of white matter hypoattenuation are also present and stable consistent with chronic microvascular ischemic change. Vascular: No hyperdense vessel or unexpected calcification. Skull: Normal. Negative for fracture or focal lesion. Sinuses/Orbits: Globes and orbits are unremarkable. Visualized sinuses are essentially clear. Other: None. IMPRESSION: 1. No acute intracranial abnormalities. 2. Atrophy and mild chronic microvascular ischemic change stable from prior study. Electronically Signed   By: Lajean Manes M.D.   On: 12/17/2020 18:27   CT Chest W Contrast  Result Date: 12/17/2020 CLINICAL DATA:  Follow-up for abnormal chest radiograph. Patient history of confusion/altered mental status. History of gastrointestinal carcinoma. EXAM: CT CHEST WITH CONTRAST TECHNIQUE: Multidetector CT imaging of the chest was performed during intravenous contrast administration. CONTRAST:  33mL OMNIPAQUE IOHEXOL 300 MG/ML  SOLN COMPARISON:  04/06/2020 and current chest radiograph FINDINGS: Cardiovascular: Heart is mildly enlarged. Dense three-vessel coronary artery calcifications. Ascending aorta dilated to 4.2 cm. Aortic atherosclerotic calcifications. Mediastinum/Nodes:  No neck base, mediastinal or hilar masses or enlarged lymph nodes. Trachea is unremarkable esophagus is normal in caliber. Lungs/Pleura: Moderate partly loculated right pleural effusion. Loculated fluid is noted along the mid to upper oblique fissure. Bronchus intermedius is partly effaced. There is atelectasis a significant portion of the right lower lobe. Central opacity with mild bronchiectasis is noted in the posteroinferior right middle lobe. There is patchy opacity in a peribronchovascular distribution in the left lower lobe that may also be atelectasis, although has a configuration suggesting at least a component of pneumonia. 17 mm focus of ground-glass opacity is noted peripherally in the left upper lobe, centered on image 61, series 5. No evidence of pulmonary edema. No lung mass or suspicious nodule. No pneumothorax. Upper Abdomen: No acute findings. Status post cholecystectomy. Increase in colonic stool burden. Musculoskeletal: No fracture or acute finding.  No bone lesion. IMPRESSION: 1. Patchy peribronchovascular opacity in the left lower suspicious for pneumonia. 2. Small to moderate-sized partly loculated right pleural effusion. Significant atelectasis of the right lower lobe, with a smaller area of atelectasis/scarring of the right middle lobe. 3. Small focus of ground-glass opacity in the peripheral left upper lobe. This may reflect additional infection. It also qualifies as a ground-glass nodule in follow-up is recommended. Initial follow-up with CT at 6-12 months is recommended to confirm persistence. If persistent, repeat CT is recommended every 2 years until 5 years of stability has been established. This recommendation follows the consensus statement: Guidelines for Management of Incidental Pulmonary Nodules Detected on CT Images: From the Fleischner Society 2017; Radiology 2017; 284:228-243. 4. Mild cardiomegaly. Dense three-vessel coronary artery calcifications. Aortic atherosclerosis. 5.  Ascending aorta dilated to 4.1 cm. Recommend annual imaging followup by CTA or MRA. This recommendation follows 2010 ACCF/AHA/AATS/ACR/ASA/SCA/SCAI/SIR/STS/SVM Guidelines for the Diagnosis and Management of Patients with Thoracic Aortic Disease. Circulation. 2010; [id number]. Aortic aneurysm NOS (ICD10-I71.9) Aortic Atherosclerosis (ICD10-I70.0). Electronically Signed   By: Lajean Manes M.D.   On: 12/17/2020 16:25   DG Chest Port 1 View  Result Date: 12/17/2020 CLINICAL DATA:  87 year old male with altered mental status. Confusion. History of gastrointestinal cancer stated on prior CT Chest, Abdomen, and Pelvis - most recently 04/06/2020. EXAM: PORTABLE CHEST 1 VIEW COMPARISON:  Portable chest 09/11/2020 and earlier, including chest CT  04/26/2020. FINDINGS: Portable AP semi upright view at 1141 hours. Moderate veiling opacity in the right lower lung is new since October. Obscured right hemidiaphragm. Lung volumes appear stable. Mediastinal contours appear stable. Allowing for portable technique the left lung is clear. No pneumothorax or pulmonary edema. Visualized tracheal air column is within normal limits. Stable surgical clips in the right upper quadrant. Chronic right lateral rib fractures appear stable. IMPRESSION: 1. Moderate size new veiling opacity in the right lower lung. This could be a moderate size right pleural effusion with atelectasis, or a combination of pleural fluid with pneumonia or new lung mass (history of GI stromal tumor). Chest CT (IV contrast preferred) would best characterize further. 2. Left lung remains clear. Electronically Signed   By: Genevie Ann M.D.   On: 12/17/2020 11:59   US THORACENTESIS ASP PLEURAL SPACE W/IMG GUIDE  Result Date: 12/18/2020 INDICATION: Patient with history of pneumonia, encephalopathy, atrial fibrillation, loculated right pleural effusion; request received for diagnostic and therapeutic right thoracentesis. EXAM: ULTRASOUND GUIDED DIAGNOSTIC AND  THERAPEUTIC RIGHT THORACENTESIS MEDICATIONS: 1% lidocaine to skin and subcutaneous tissue COMPLICATIONS: None immediate. PROCEDURE: An ultrasound guided thoracentesis was thoroughly discussed with the patient's son and questions answered. The benefits, risks, alternatives and complications were also discussed. The patient's son understands and wishes to proceed with the procedure. Written consent was obtained. Ultrasound was performed to localize and mark an adequate pocket of fluid in the right chest. The area was then prepped and draped in the normal sterile fashion. 1% Lidocaine was used for local anesthesia. Under ultrasound guidance a 6 Fr Safe-T-Centesis catheter was introduced. Thoracentesis was performed. The catheter was removed and a dressing applied. FINDINGS: A total of approximately 225 cc of hazy,amber fluid was removed. Samples were sent to the laboratory as requested by the clinical team. The pleural effusion was multiloculated by ultrasound today. IMPRESSION: Successful ultrasound guided diagnostic and therapeutic right thoracentesis yielding 225 cc of pleural fluid. Read by: Rowe Robert, PA-C Electronically Signed   By: Sandi Mariscal M.D.   On: 12/18/2020 13:35        Scheduled Meds: . apixaban  2.5 mg Oral BID  . artificial tears  1 application Left Eye TID  . Chlorhexidine Gluconate Cloth  6 each Topical Daily  . feeding supplement  237 mL Oral BID BM  . mirtazapine  30 mg Oral QHS  . naloxegol oxalate  25 mg Oral Daily   Continuous Infusions: . azithromycin Stopped (12/18/20 1656)  . cefTRIAXone (ROCEPHIN)  IV Stopped (12/18/20 2027)  . dextrose 5 % and 0.45% NaCl 75 mL/hr at 12/19/20 0657  . diltiazem (CARDIZEM) infusion 15 mg/hr (12/19/20 0759)     LOS: 2 days        Hosie Poisson, MD [facility]   To contact the attending provider between 7A-7P or the covering provider during after hours 7P-7A, please log into the web site [email or web address] and access using  universal [place] password for that web site. If you do not have the password, please call the hospital operator.  12/19/2020, 11:35 AM

## 2020-12-19 NOTE — ED Notes (Signed)
ED TO INPATIENT HANDOFF REPORT  Name/Age/Gender [name]. 87 y.o. male  Code Status    Code Status Orders  (From admission, onward)         Start     Ordered   12/17/20 1542  Do not attempt resuscitation (DNR)  Continuous       Question Answer Comment  In the event of cardiac or respiratory ARREST Do not call a "code blue"   In the event of cardiac or respiratory ARREST Do not perform Intubation, CPR, defibrillation or ACLS   In the event of cardiac or respiratory ARREST Use medication by any route, position, wound care, and other measures to relive pain and suffering. May use oxygen, suction and manual treatment of airway obstruction as needed for comfort.      12/17/20 1544        Code Status History    Date Active Date Inactive Code Status Order ID Comments User Context   11/05/2018 1755 11/06/2018 1139 Full Code [id number]  Wylene Simmer, MD Inpatient   03/12/2018 1605 03/24/2018 1843 Full Code [id number]  Stark Klein, MD Inpatient   02/16/2018 0954 02/24/2018 1742 Full Code [id number]  Shela Leff, MD ED   12/30/2017 1945 12/31/2017 2146 Full Code [id number]  Phillips Grout, MD ED   07/07/2017 2055 07/08/2017 2050 Full Code [id number]  Rod Can, MD Inpatient   07/29/2015 1721 07/30/2015 1653 Full Code [id number]  Caren Griffins, MD ED   12/31/2012 1216 01/01/2013 2206 Full Code [id number]  Thomasene Lot, RN Inpatient   Advance Care Planning Activity      Home/SNF/Other Home  Chief Complaint Atrial fibrillation with RVR ([place]) [I48.91]  Level of Care/Admitting Diagnosis ED Disposition    ED Disposition Condition [place] [[id number]]  Level of Care: Stepdown [14]  Admit to SDU based on following criteria: Cardiac Instability:  Patients experiencing chest pain, unconfirmed MI and stable, arrhythmias and CHF requiring medical management and potentially compromising patient's stability  May admit patient to  [facility] or [facility] if equivalent level of care is available:: Yes  Covid Evaluation: Asymptomatic Screening Protocol (No Symptoms)  Diagnosis: Atrial fibrillation with RVR [facility]) [[id number]]  Admitting Physician: Doran Heater [[id number]]  Attending Physician: DAYSON, ABOUD [[id number]]  Estimated length of stay: 3 - 4 days  Certification:: I certify this patient will need inpatient services for at least 2 midnights       Medical History Past Medical History:  Diagnosis Date  . Anemia   . Asthma    AS A CHILD  . Atrial fibrillation ([place])   . Blind left eye   . Cancer [facility])    PROSTATE CANCER  . Cellulitis    frequently, "all over"  . Chronic kidney disease   . Chronic pain syndrome   . Chronic ulcer of toe, right, with unspecified severity ([place])    2nd toe  . Constipation   . DDD (degenerative disc disease), lumbar   . Gastrointestinal stromal tumor (GIST) of stomach ([place])   . GI bleeding   . History of blood transfusion    01/2018  . Insomnia   . Kyphoscoliosis   . Mass in the abdomen 01/2018  . Restless leg   . Sleep apnea    does not wear CPAP  . Wears glasses     Allergies Allergies  Allergen Reactions  . Coconut Flavor [Flavoring Agent] Hypertension    Anything  that is related to coconut---LOSS OF CONSCIOUSNESS ([place])  . Coconut Oil Other (See Comments)    Anything that is related to coconut--- LOSS OF CONSCIOUSNESS ([place])  . Doxycycline Hyclate Hives  . Food Anaphylaxis    ALLERGIC TO ALL NUTS WITH THE EXCEPTION OF CASHEWS, ALMONDS  . Black [employer]  Other (See Comments)    Passes out ( can only eat almonds or peanuts)  . Coconut Oil Other (See Comments)    Faints   . Doxycycline Rash    IV Location/Drains/Wounds Patient Lines/Drains/Airways Status    Active Line/Drains/Airways    Name Placement date Placement time Site Days   Peripheral IV 12/17/20 Right Forearm 12/17/20  1133  Forearm  2   Peripheral IV 12/17/20 Anterior;Left  Forearm 12/17/20  1500  Forearm  2   External Urinary Catheter 12/18/20  1529  --  1   Incision (Closed) 03/12/18 Abdomen Other (Comment) 03/12/18  1129  -- 1013   Incision (Closed) 11/05/18 Foot Right 11/05/18  1433  -- 775   Incision - 2 Ports Abdomen Right;Upper Right;Lower 03/12/18  1124  -- 1013          Labs/Imaging Results for orders placed or performed during the hospital encounter of 12/17/20 (from the past 48 hour(s))  Culture, blood (routine x 2)     Status: None (Preliminary result)   Collection Time: 12/17/20 10:20 AM   Specimen: BLOOD  Result Value Ref Range   Specimen Description      BLOOD BLOOD RIGHT FOREARM Performed at [facility] [address] [place], [place] [address]    Special Requests      BOTTLES DRAWN AEROBIC AND ANAEROBIC Blood Culture adequate volume Performed at [facility] [address], [place], [place] [address]    Culture      NO GROWTH < 24 HOURS Performed at [facility] [address], [place], [place] [address]    Report Status PENDING   Culture, blood (routine x 2)     Status: None (Preliminary result)   Collection Time: 12/17/20 10:25 AM   Specimen: BLOOD  Result Value Ref Range   Specimen Description      BLOOD BLOOD LEFT FOREARM Performed at [facility] [address], [place], [place] [address]    Special Requests      BOTTLES DRAWN AEROBIC AND ANAEROBIC Blood Culture results may not be optimal due to an inadequate volume of blood received in culture bottles Performed at [facility], [place] [address], [place], [place] [address]    Culture      NO GROWTH < 24 HOURS Performed at [facility] [address], [place], [place] [address]    Report Status PENDING   Lactic acid, plasma     Status: None   Collection Time: 12/17/20 11:35 AM  Result Value Ref Range   Lactic Acid, Venous 1.6 0.5 - 1.9 mmol/L    Comment: Performed at [facility], [place] [address], [place],  [address]  Comprehensive metabolic panel     Status: Abnormal   Collection Time: 12/17/20 11:38 AM  Result Value Ref Range   Sodium 147 (H) 135 - 145 mmol/L   Potassium 4.0 3.5 - 5.1 mmol/L   Chloride 108 98 - 111 mmol/L   CO2 33 (H) 22 - 32 mmol/L   Glucose, Bld 145 (H) 70 - 99 mg/dL    Comment: Glucose reference range  applies only to samples taken after fasting for at least 8 hours.   BUN 43 (H) 8 - 23 mg/dL   Creatinine, Ser 1.13 0.61 - 1.24 mg/dL   Calcium 9.5 8.9 - 10.3 mg/dL   Total Protein 7.0 6.5 - 8.1 g/dL   Albumin 3.1 (L) 3.5 - 5.0 g/dL   AST 54 (H) 15 - 41 U/L   ALT 33 0 - 44 U/L   Alkaline Phosphatase 81 38 - 126 U/L   Total Bilirubin 0.8 0.3 - 1.2 mg/dL   GFR, Estimated >60 >60 mL/min    Comment: (NOTE) Calculated using the CKD-EPI Creatinine Equation (2021)    Anion gap 6 5 - 15    Comment: Performed at [facility], [place] [address], [place], [place], [place] [address]  CBC with Differential     Status: Abnormal   Collection Time: 12/17/20 11:38 AM  Result Value Ref Range   WBC 22.9 (H) 4.0 - 10.5 K/uL   RBC 4.61 4.22 - 5.81 MIL/uL   Hemoglobin 13.1 13.0 - 17.0 g/dL   HCT 42.6 39.0 - 52.0 %   MCV 92.4 80.0 - 100.0 fL   MCH 28.4 26.0 - 34.0 pg   MCHC 30.8 30.0 - 36.0 g/dL   RDW 14.6 11.5 - 15.5 %   Platelets 262 150 - 400 K/uL   nRBC 0.0 0.0 - 0.2 %   Neutrophils Relative % 87 %   Neutro Abs 20.1 (H) 1.7 - 7.7 K/uL   Lymphocytes Relative 4 %   Lymphs Abs 0.9 0.7 - 4.0 K/uL   Monocytes Relative 8 %   Monocytes Absolute 1.7 (H) 0.1 - 1.0 K/uL   Eosinophils Relative 0 %   Eosinophils Absolute 0.0 0.0 - 0.5 K/uL   Basophils Relative 0 %   Basophils Absolute 0.0 0.0 - 0.1 K/uL   Immature Granulocytes 1 %   Abs Immature Granulocytes 0.16 (H) 0.00 - 0.07 K/uL    Comment: Performed at [facility], [place] [address] [place],  [address]  Urinalysis, Routine w reflex  microscopic Urine, Catheterized     Status: Abnormal   Collection Time: 12/17/20 12:20 PM  Result Value Ref Range   Color, Urine YELLOW YELLOW   APPearance HAZY (A) CLEAR   Specific Gravity, Urine 1.025 1.005 - 1.030   pH 5.0 5.0 - 8.0   Glucose, UA NEGATIVE NEGATIVE mg/dL   Hgb urine dipstick SMALL (A) NEGATIVE   Bilirubin Urine NEGATIVE NEGATIVE   Ketones, ur NEGATIVE NEGATIVE mg/dL   Protein, ur 30 (A) NEGATIVE mg/dL   Nitrite NEGATIVE NEGATIVE   Leukocytes,Ua NEGATIVE NEGATIVE   RBC / HPF 21-50 0 - 5 RBC/hpf   WBC, UA 0-5 0 - 5 WBC/hpf   Bacteria, UA NONE SEEN NONE SEEN   Mucus PRESENT     Comment: Performed at [facility], [place] [address], [place], [place] [address]  SARS CORONAVIRUS 2 (TAT 6-24 HRS) Nasopharyngeal Nasopharyngeal Swab     Status: None   Collection Time: 12/17/20  3:02 PM   Specimen: Nasopharyngeal Swab  Result Value Ref Range   SARS Coronavirus 2 NEGATIVE NEGATIVE    Comment: (NOTE) SARS-CoV-2 target nucleic acids are NOT DETECTED.  The SARS-CoV-2 RNA is generally detectable in upper and lower respiratory specimens during the acute phase of infection. Negative results do not preclude SARS-CoV-2 infection, do not rule out co-infections with other pathogens, and should not be used as the sole basis for treatment or  other patient management decisions. Negative results must be combined with clinical observations, patient history, and epidemiological information. The expected result is Negative.  Fact Sheet for Patients: SugarRoll.be  Fact Sheet for Healthcare Providers: https://www.woods-mathews.com/  This test is not yet approved or cleared by the [place] FDA and  has been authorized for detection and/or diagnosis of SARS-CoV-2 by FDA under an Emergency Use Authorization (EUA). This EUA will remain  in effect (meaning this test can be used) for the duration of the COVID-19 declaration  under Se ction 564(b)(1) of the Act, 21 U.S.C. section 360bbb-3(b)(1), unless the authorization is terminated or revoked sooner.  Performed at [facility], [place] [address], [place], [place] [address]   Lactic acid, plasma     Status: None   Collection Time: 12/17/20  3:05 PM  Result Value Ref Range   Lactic Acid, Venous 1.7 0.5 - 1.9 mmol/L    Comment: Performed at [facility] [place] [address], [place], [place] [address]  Comprehensive metabolic panel     Status: Abnormal   Collection Time: 12/18/20  4:25 AM  Result Value Ref Range   Sodium 149 (H) 135 - 145 mmol/L   Potassium 3.8 3.5 - 5.1 mmol/L   Chloride 111 98 - 111 mmol/L   CO2 26 22 - 32 mmol/L   Glucose, Bld 96 70 - 99 mg/dL    Comment: Glucose reference range applies only to samples taken after fasting for at least 8 hours.   BUN 32 (H) 8 - 23 mg/dL   Creatinine, Ser 0.84 0.61 - 1.24 mg/dL   Calcium 8.7 (L) 8.9 - 10.3 mg/dL   Total Protein 6.0 (L) 6.5 - 8.1 g/dL   Albumin 2.6 (L) 3.5 - 5.0 g/dL   AST 52 (H) 15 - 41 U/L   ALT 34 0 - 44 U/L   Alkaline Phosphatase 82 38 - 126 U/L   Total Bilirubin 0.7 0.3 - 1.2 mg/dL   GFR, Estimated >60 >60 mL/min    Comment: (NOTE) Calculated using the CKD-EPI Creatinine Equation (2021)    Anion gap 12 5 - 15    Comment: Performed at [facility] [place] [address] [place], [place] [address]  CBC     Status: Abnormal   Collection Time: 12/18/20  4:25 AM  Result Value Ref Range   WBC 22.1 (H) 4.0 - 10.5 K/uL   RBC 4.42 4.22 - 5.81 MIL/uL   Hemoglobin 12.7 (L) 13.0 - 17.0 g/dL   HCT 40.6 39.0 - 52.0 %   MCV 91.9 80.0 - 100.0 fL   MCH 28.7 26.0 - 34.0 pg   MCHC 31.3 30.0 - 36.0 g/dL   RDW 14.7 11.5 - 15.5 %   Platelets 294 150 - 400 K/uL   nRBC 0.0 0.0 - 0.2 %    Comment: Performed at [facility], [place] [address] [place], [place] [address]  Magnesium     Status: None   Collection Time: 12/18/20  4:25 AM  Result  Value Ref Range   Magnesium 2.2 1.7 - 2.4 mg/dL    Comment: Performed at [facility], [place] [address] [place], [place] [address]  Lactate dehydrogenase (pleural or peritoneal fluid)     Status: Abnormal   Collection Time: 12/18/20  1:27 PM  Result Value Ref Range   LD, Fluid 1,318 (H) 3 - 23 U/L    Comment: (NOTE) Results should be evaluated in conjunction with serum values  Fluid Type-FLDH CYTO PLEU     Comment: Performed at [facility], [place] [address], [place], [place] [address]  Body fluid cell count with differential     Status: Abnormal   Collection Time: 12/18/20  1:27 PM  Result Value Ref Range   Fluid Type-FCT CYTO PLEU    Color, Fluid AMBER (A) YELLOW   Appearance, Fluid HAZY (A) CLEAR   Total Nucleated Cell Count, Fluid 1,176 (H) 0 - 1,000 cu mm   Neutrophil Count, Fluid 97 (H) 0 - 25 %   Lymphs, Fluid 0 %   Monocyte-Macrophage-Serous Fluid 3 (L) 50 - 90 %   Eos, Fluid 0 %   Other Cells, Fluid CORRELATE WITH CYTOLOGY. %    Comment: Performed at [facility], [place] [address], [place], [place] [address]  Body fluid culture     Status: None (Preliminary result)   Collection Time: 12/18/20  1:27 PM   Specimen: PATH Cytology Pleural fluid  Result Value Ref Range   Specimen Description      PLEURAL Performed at [facility], [place] [address] [place], [place] [address]    Special Requests      NONE Performed at [facility], [place] [address], [place], [place] [address]    Gram Stain      NO WBC SEEN NO ORGANISMS SEEN Performed at [facility], [place] [address], [place], [place] [address]    Culture PENDING    Report Status PENDING   Albumin, pleural or peritoneal fluid     Status: None   Collection Time: 12/18/20  1:27 PM  Result Value Ref Range   Albumin, Fluid 2.0 g/dL    Comment: (NOTE) No normal range established for this test Results should be evaluated in  conjunction with serum values    Fluid Type-FALB CYTO PLEU     Comment: Performed at [facility], [place] [address] [place], [place] [address]  Protein, pleural or peritoneal fluid     Status: None   Collection Time: 12/18/20  1:27 PM  Result Value Ref Range   Total protein, fluid 3.9 g/dL    Comment: (NOTE) No normal range established for this test Results should be evaluated in conjunction with serum values    Fluid Type-FTP CYTO PLEU     Comment: Performed at [facility], [place] [address], [place], [place] [address]  Basic metabolic panel     Status: Abnormal   Collection Time: 12/19/20  4:00 AM  Result Value Ref Range   Sodium 147 (H) 135 - 145 mmol/L   Potassium 3.3 (L) 3.5 - 5.1 mmol/L   Chloride 109 98 - 111 mmol/L   CO2 28 22 - 32 mmol/L   Glucose, Bld 131 (H) 70 - 99 mg/dL    Comment: Glucose reference range applies only to samples taken after fasting for at least 8 hours.   BUN 20 8 - 23 mg/dL   Creatinine, Ser 0.79 0.61 - 1.24 mg/dL   Calcium 8.3 (L) 8.9 - 10.3 mg/dL   GFR, Estimated >60 >60 mL/min    Comment: (NOTE) Calculated using the CKD-EPI Creatinine Equation (2021)    Anion gap 10 5 - 15    Comment: Performed at [facility], [place] [address] [place], [place] [address]  CBC with Differential/Platelet     Status: Abnormal   Collection Time: 12/19/20  4:00 AM  Result Value Ref Range  WBC 20.8 (H) 4.0 - 10.5 K/uL   RBC 4.34 4.22 - 5.81 MIL/uL   Hemoglobin 12.5 (L) 13.0 - 17.0 g/dL   HCT 39.4 39.0 - 52.0 %   MCV 90.8 80.0 - 100.0 fL   MCH 28.8 26.0 - 34.0 pg   MCHC 31.7 30.0 - 36.0 g/dL   RDW 14.6 11.5 - 15.5 %   Platelets 358 150 - 400 K/uL   nRBC 0.0 0.0 - 0.2 %   Neutrophils Relative % 86 %   Neutro Abs 17.7 (H) 1.7 - 7.7 K/uL   Lymphocytes Relative 6 %   Lymphs Abs 1.2 0.7 - 4.0 K/uL   Monocytes Relative 7 %   Monocytes Absolute 1.5 (H) 0.1 - 1.0 K/uL   Eosinophils Relative 0 %   Eosinophils  Absolute 0.1 0.0 - 0.5 K/uL   Basophils Relative 0 %   Basophils Absolute 0.1 0.0 - 0.1 K/uL   Immature Granulocytes 1 %   Abs Immature Granulocytes 0.28 (H) 0.00 - 0.07 K/uL    Comment: Performed at [facility], [place] [address] [place], [place] [address]   DG Chest 1 View  Result Date: 12/18/2020 CLINICAL DATA:  Post thesis EXAM: CHEST  1 VIEW COMPARISON:  Portable exam 1356 hours compared to 12/17/2020 FINDINGS: Upper normal size of cardiac silhouette. Mediastinal contours and pulmonary vascularity normal. Atherosclerotic calcification aorta. Partially loculated RIGHT pleural effusion again identified. No pneumothorax following thoracentesis. Persistent atelectasis versus infiltrate at medial LEFT lower lobe. Bones demineralized. IMPRESSION: No pneumothorax following thoracentesis. Persistent partially loculated RIGHT pleural effusion and LEFT basilar atelectasis versus infiltrate. Electronically Signed   By: Lavonia Dana M.D.   On: 12/18/2020 14:06   CT HEAD WO CONTRAST  Result Date: 12/17/2020 CLINICAL DATA:  UTI with AMS Patient had 75 cc of isvoue 300 at 1545 EXAM: CT HEAD WITHOUT CONTRAST TECHNIQUE: Contiguous axial images were obtained from the base of the skull through the vertex without intravenous contrast. COMPARISON:  09/01/2020 a lesion FINDINGS: Brain: No evidence of acute infarction, hemorrhage, hydrocephalus, extra-axial collection or mass lesion/mass effect. There is ventricular sulcal enlargement reflecting diffuse atrophy stable from the prior study. Mild patchy areas of white matter hypoattenuation are also present and stable consistent with chronic microvascular ischemic change. Vascular: No hyperdense vessel or unexpected calcification. Skull: Normal. Negative for fracture or focal lesion. Sinuses/Orbits: Globes and orbits are unremarkable. Visualized sinuses are essentially clear. Other: None. IMPRESSION: 1. No acute intracranial abnormalities. 2. Atrophy and  mild chronic microvascular ischemic change stable from prior study. Electronically Signed   By: Lajean Manes M.D.   On: 12/17/2020 18:27   CT Chest W Contrast  Result Date: 12/17/2020 CLINICAL DATA:  Follow-up for abnormal chest radiograph. Patient history of confusion/altered mental status. History of gastrointestinal carcinoma. EXAM: CT CHEST WITH CONTRAST TECHNIQUE: Multidetector CT imaging of the chest was performed during intravenous contrast administration. CONTRAST:  15mL OMNIPAQUE IOHEXOL 300 MG/ML  SOLN COMPARISON:  04/06/2020 and current chest radiograph FINDINGS: Cardiovascular: Heart is mildly enlarged. Dense three-vessel coronary artery calcifications. Ascending aorta dilated to 4.2 cm. Aortic atherosclerotic calcifications. Mediastinum/Nodes: No neck base, mediastinal or hilar masses or enlarged lymph nodes. Trachea is unremarkable esophagus is normal in caliber. Lungs/Pleura: Moderate partly loculated right pleural effusion. Loculated fluid is noted along the mid to upper oblique fissure. Bronchus intermedius is partly effaced. There is atelectasis a significant portion of the right lower lobe. Central opacity with mild bronchiectasis is noted in the posteroinferior right middle lobe.  There is patchy opacity in a peribronchovascular distribution in the left lower lobe that may also be atelectasis, although has a configuration suggesting at least a component of pneumonia. 17 mm focus of ground-glass opacity is noted peripherally in the left upper lobe, centered on image 61, series 5. No evidence of pulmonary edema. No lung mass or suspicious nodule. No pneumothorax. Upper Abdomen: No acute findings. Status post cholecystectomy. Increase in colonic stool burden. Musculoskeletal: No fracture or acute finding.  No bone lesion. IMPRESSION: 1. Patchy peribronchovascular opacity in the left lower suspicious for pneumonia. 2. Small to moderate-sized partly loculated right pleural effusion. Significant  atelectasis of the right lower lobe, with a smaller area of atelectasis/scarring of the right middle lobe. 3. Small focus of ground-glass opacity in the peripheral left upper lobe. This may reflect additional infection. It also qualifies as a ground-glass nodule in follow-up is recommended. Initial follow-up with CT at 6-12 months is recommended to confirm persistence. If persistent, repeat CT is recommended every 2 years until 5 years of stability has been established. This recommendation follows the consensus statement: Guidelines for Management of Incidental Pulmonary Nodules Detected on CT Images: From the Fleischner Society 2017; Radiology 2017; 284:228-243. 4. Mild cardiomegaly. Dense three-vessel coronary artery calcifications. Aortic atherosclerosis. 5. Ascending aorta dilated to 4.1 cm. Recommend annual imaging followup by CTA or MRA. This recommendation follows 2010 ACCF/AHA/AATS/ACR/ASA/SCA/SCAI/SIR/STS/SVM Guidelines for the Diagnosis and Management of Patients with Thoracic Aortic Disease. Circulation. 2010; [id number]. Aortic aneurysm NOS (ICD10-I71.9) Aortic Atherosclerosis (ICD10-I70.0). Electronically Signed   By: Lajean Manes M.D.   On: 12/17/2020 16:25   DG Chest Port 1 View  Result Date: 12/17/2020 CLINICAL DATA:  87 year old male with altered mental status. Confusion. History of gastrointestinal cancer stated on prior CT Chest, Abdomen, and Pelvis - most recently 04/06/2020. EXAM: PORTABLE CHEST 1 VIEW COMPARISON:  Portable chest 09/11/2020 and earlier, including chest CT 04/26/2020. FINDINGS: Portable AP semi upright view at 1141 hours. Moderate veiling opacity in the right lower lung is new since October. Obscured right hemidiaphragm. Lung volumes appear stable. Mediastinal contours appear stable. Allowing for portable technique the left lung is clear. No pneumothorax or pulmonary edema. Visualized tracheal air column is within normal limits. Stable surgical clips in the right upper  quadrant. Chronic right lateral rib fractures appear stable. IMPRESSION: 1. Moderate size new veiling opacity in the right lower lung. This could be a moderate size right pleural effusion with atelectasis, or a combination of pleural fluid with pneumonia or new lung mass (history of GI stromal tumor). Chest CT (IV contrast preferred) would best characterize further. 2. Left lung remains clear. Electronically Signed   By: Genevie Ann M.D.   On: 12/17/2020 11:59   US THORACENTESIS ASP PLEURAL SPACE W/IMG GUIDE  Result Date: 12/18/2020 INDICATION: Patient with history of pneumonia, encephalopathy, atrial fibrillation, loculated right pleural effusion; request received for diagnostic and therapeutic right thoracentesis. EXAM: ULTRASOUND GUIDED DIAGNOSTIC AND THERAPEUTIC RIGHT THORACENTESIS MEDICATIONS: 1% lidocaine to skin and subcutaneous tissue COMPLICATIONS: None immediate. PROCEDURE: An ultrasound guided thoracentesis was thoroughly discussed with the patient's son and questions answered. The benefits, risks, alternatives and complications were also discussed. The patient's son understands and wishes to proceed with the procedure. Written consent was obtained. Ultrasound was performed to localize and mark an adequate pocket of fluid in the right chest. The area was then prepped and draped in the normal sterile fashion. 1% Lidocaine was used for local anesthesia. Under ultrasound guidance a 6 Fr Safe-T-Centesis catheter  was introduced. Thoracentesis was performed. The catheter was removed and a dressing applied. FINDINGS: A total of approximately 225 cc of hazy,amber fluid was removed. Samples were sent to the laboratory as requested by the clinical team. The pleural effusion was multiloculated by ultrasound today. IMPRESSION: Successful ultrasound guided diagnostic and therapeutic right thoracentesis yielding 225 cc of pleural fluid. Read by: Rowe Robert, PA-C Electronically Signed   By: Sandi Mariscal M.D.   On:  12/18/2020 13:35    Pending Labs Unresulted Labs (From admission, onward)          Start     Ordered   12/18/20 1329  CD19 and CD20, Flow Cytometry  RELEASE UPON ORDERING,   STAT        12/18/20 1329          Vitals/Pain Today's Vitals   12/19/20 0200 12/19/20 0245 12/19/20 0315 12/19/20 0435  BP: 137/80 128/82 124/70   Pulse: (!) 120 (!) 114 (!) 106   Resp: 19 (!) 22 (!) 25   Temp:    98.2 F (36.8 C)  TempSrc:    Oral  SpO2: 96% 96% 94%   Weight:      Height:      PainSc:        Isolation Precautions No active isolations  Medications Medications  diltiazem (CARDIZEM) 1 mg/mL load via infusion 5 mg (5 mg Intravenous Bolus from Bag 12/17/20 1513)    And  diltiazem (CARDIZEM) 125 mg in dextrose 5% 125 mL (1 mg/mL) infusion (15 mg/hr Intravenous New Bag/Given 12/18/20 2346)  apixaban (ELIQUIS) tablet 2.5 mg (2.5 mg Oral Given 12/18/20 2223)  artificial tears (LACRILUBE) ophthalmic ointment 1 application (1 application Left Eye Given 12/18/20 2226)  mirtazapine (REMERON) tablet 30 mg (30 mg Oral Given 12/18/20 2223)  morphine (MSIR) tablet 30 mg (has no administration in time range)  naloxegol oxalate (MOVANTIK) tablet 25 mg (25 mg Oral Given 12/18/20 1053)  gabapentin (NEURONTIN) capsule 300 mg (has no administration in time range)  acetaminophen (TYLENOL) tablet 650 mg (has no administration in time range)    Or  acetaminophen (TYLENOL) suppository 650 mg (has no administration in time range)  ondansetron (ZOFRAN) tablet 4 mg (has no administration in time range)    Or  ondansetron (ZOFRAN) injection 4 mg (has no administration in time range)  ipratropium-albuterol (DUONEB) 0.5-2.5 (3) MG/3ML nebulizer solution 3 mL (has no administration in time range)  azithromycin (ZITHROMAX) 500 mg in sodium chloride 0.9 % 250 mL IVPB (0 mg Intravenous Stopped 12/18/20 2027)  cefTRIAXone (ROCEPHIN) 1 g in sodium chloride 0.9 % 100 mL IVPB (0 g Intravenous Stopped 12/18/20 2027)   dextrose 5 %-0.45 % sodium chloride infusion (0 mLs Intravenous Paused 12/19/20 0432)  lidocaine (XYLOCAINE) 1 % (with pres) injection (has no administration in time range)  sodium chloride 0.9 % bolus 1,000 mL (0 mLs Intravenous Stopped 12/17/20 1305)  cefTRIAXone (ROCEPHIN) 1 g in sodium chloride 0.9 % 100 mL IVPB (0 g Intravenous Stopped 12/17/20 1305)  sodium chloride 0.9 % bolus 1,000 mL (0 mLs Intravenous Stopped 12/17/20 1504)  azithromycin (ZITHROMAX) 500 mg in sodium chloride 0.9 % 250 mL IVPB (0 mg Intravenous Stopped 12/17/20 1840)  iohexol (OMNIPAQUE) 300 MG/ML solution 75 mL (75 mLs Intravenous Contrast Given 12/17/20 1544)  digoxin (LANOXIN) 0.25 MG/ML injection 0.25 mg (0.25 mg Intravenous Given 12/18/20 0552)    Mobility manual wheelchair

## 2020-12-19 NOTE — Progress Notes (Signed)
Initial Nutrition Assessment  RD working remotely.  DOCUMENTATION CODES:   Underweight  INTERVENTION:  - will order Ensure Enlive po BID, each supplement provides 350 kcal and 20 grams of protein. - complete NFPE when feasible.   NUTRITION DIAGNOSIS:   Increased nutrient needs related to acute illness as evidenced by estimated needs.  GOAL:   Patient will meet greater than or equal to 90% of their needs  MONITOR:   PO intake,Supplement acceptance,Labs,Weight trends  REASON FOR ASSESSMENT:   Consult Assessment of nutrition requirement/status  ASSESSMENT:   87 year old male with medical history of afib on anticoagulation, OSA, and chronic pain syndrome. He presented to the ED due to AMS. CXR in the ED showed RLL opacity. CT chest showed patchy peribronchovascular opacity suspicious for PNA, small to moderate-sized partly loculated R pleural effusion, and significant atelectasis of the RLL, with a smaller area of atelectasis/scarring of the R middle lobe.  No intakes documented since admission. Patient noted to be a/o to self only so did not attempt to contact patient.   He has not been seen by a [place] RD at any time in the past.   Weight today is 127 lb and weight has been stable since 07/14/20 when he weighed 129 lb.   Per notes: - sepsis 2/2 LLL PNA - afib with RVR - hypernatremia thought to be 2/2 poor oral intake and free water deficit - acute metabolic encephalopathy   Labs reviewed; Na: 147 mmol/l, K: 3.3 mmol/l, Ca: 8.3 mg/dl. Medications reviewed;  IVF; D5-1/2 NS @ 75 ml/hr (306 kcal/24 hours).    NUTRITION - FOCUSED PHYSICAL EXAM:  unable to complete at this time.  Diet Order:   Diet Order            Diet regular Room service appropriate? Yes; Fluid consistency: Thin  Diet effective now                 EDUCATION NEEDS:   No education needs have been identified at this time  Skin:  Skin Assessment: Skin Integrity Issues: Skin Integrity  Issues:: Other (Comment) Other: MASD coccyx  Last BM:  PTA/unknown  Height:   Ht Readings from Last 1 Encounters:  12/17/20 5\' 11"  (1.803 m)    Weight:   Wt Readings from Last 1 Encounters:  12/19/20 57.7 kg    Estimated Nutritional Needs:  Kcal:  1500-1700 kcal Protein:  70-80 grams Fluid:  >/= 1.8 L/day      Jarome Matin, MS, RD, LDN, CNSC Inpatient Clinical Dietitian RD pager # available in AMION  After hours/weekend pager # available in [facility]

## 2020-12-20 DIAGNOSIS — Z7189 Other specified counseling: Secondary | ICD-10-CM | POA: Diagnosis not present

## 2020-12-20 DIAGNOSIS — I4891 Unspecified atrial fibrillation: Secondary | ICD-10-CM | POA: Diagnosis not present

## 2020-12-20 DIAGNOSIS — Z9889 Other specified postprocedural states: Secondary | ICD-10-CM

## 2020-12-20 DIAGNOSIS — G9341 Metabolic encephalopathy: Secondary | ICD-10-CM | POA: Diagnosis not present

## 2020-12-20 DIAGNOSIS — J189 Pneumonia, unspecified organism: Secondary | ICD-10-CM | POA: Diagnosis not present

## 2020-12-20 DIAGNOSIS — Z515 Encounter for palliative care: Secondary | ICD-10-CM | POA: Diagnosis not present

## 2020-12-20 DIAGNOSIS — E870 Hyperosmolality and hypernatremia: Secondary | ICD-10-CM | POA: Diagnosis not present

## 2020-12-20 LAB — COMPREHENSIVE METABOLIC PANEL
ALT: 36 U/L (ref 0–44)
AST: 45 U/L — ABNORMAL HIGH (ref 15–41)
Albumin: 2.5 g/dL — ABNORMAL LOW (ref 3.5–5.0)
Alkaline Phosphatase: 76 U/L (ref 38–126)
Anion gap: 10 (ref 5–15)
BUN: 17 mg/dL (ref 8–23)
CO2: 25 mmol/L (ref 22–32)
Calcium: 8.2 mg/dL — ABNORMAL LOW (ref 8.9–10.3)
Chloride: 109 mmol/L (ref 98–111)
Creatinine, Ser: 0.83 mg/dL (ref 0.61–1.24)
GFR, Estimated: >60 mL/min (ref >60)
Glucose, Bld: 118 mg/dL — ABNORMAL HIGH (ref 70–99)
Potassium: 3.5 mmol/L (ref 3.5–5.1)
Sodium: 144 mmol/L (ref 135–145)
Total Bilirubin: 0.7 mg/dL (ref 0.3–1.2)
Total Protein: 5.6 g/dL — ABNORMAL LOW (ref 6.5–8.1)

## 2020-12-20 LAB — BLOOD GAS, VENOUS
Acid-Base Excess: 3 mmol/L — ABNORMAL HIGH (ref 0.0–2.0)
Bicarbonate: 26.6 mmol/L (ref 20.0–28.0)
O2 Saturation: 61.1 %
Patient temperature: 98.6
pCO2, Ven: 38.9 mmHg — ABNORMAL LOW (ref 44.0–60.0)
pH, Ven: 7.45 — ABNORMAL HIGH (ref 7.250–7.430)
pO2, Ven: 34 mmHg (ref 32.0–45.0)

## 2020-12-20 LAB — CBC WITH DIFFERENTIAL/PLATELET
Abs Immature Granulocytes: 0.51 10*3/uL — ABNORMAL HIGH (ref 0.00–0.07)
Basophils Absolute: 0.1 10*3/uL (ref 0.0–0.1)
Basophils Relative: 1 %
Eosinophils Absolute: 0.2 10*3/uL (ref 0.0–0.5)
Eosinophils Relative: 1 %
HCT: 41.7 % (ref 39.0–52.0)
Hemoglobin: 12.6 g/dL — ABNORMAL LOW (ref 13.0–17.0)
Immature Granulocytes: 3 %
Lymphocytes Relative: 7 %
Lymphs Abs: 1.3 10*3/uL (ref 0.7–4.0)
MCH: 28.1 pg (ref 26.0–34.0)
MCHC: 30.2 g/dL (ref 30.0–36.0)
MCV: 93.1 fL (ref 80.0–100.0)
Monocytes Absolute: 1.4 10*3/uL — ABNORMAL HIGH (ref 0.1–1.0)
Monocytes Relative: 8 %
Neutro Abs: 15.4 10*3/uL — ABNORMAL HIGH (ref 1.7–7.7)
Neutrophils Relative %: 80 %
Platelets: 369 10*3/uL (ref 150–400)
RBC: 4.48 MIL/uL (ref 4.22–5.81)
RDW: 14.6 % (ref 11.5–15.5)
WBC: 18.9 10*3/uL — ABNORMAL HIGH (ref 4.0–10.5)
nRBC: 0 % (ref 0.0–0.2)

## 2020-12-20 LAB — GLUCOSE, CAPILLARY: Glucose-Capillary: 104 mg/dL — ABNORMAL HIGH (ref 70–99)

## 2020-12-20 MED ORDER — MORPHINE SULFATE (PF) 2 MG/ML IV SOLN: 2.0000 mg | Freq: Once | INTRAVENOUS | Status: DC

## 2020-12-20 NOTE — TOC Progression Note (Signed)
Transition of Care (TOC) - Progression Note    Patient Details  Name: Casey Gates. MRN: [id number] Date of Birth: [birth year]  Transition of Care [facility]) CM/SW Contact  Leeroy Cha, RN Phone Number: 12/20/2020, 9:40 AM  Clinical Narrative:    There is a palliative care meeting to discuss goals of care at 1030 this am, contact person is the son Casey Gates at [phone]   Expected Discharge Plan: Home/Self Care Barriers to Discharge: Continued Medical Work up  Expected Discharge Plan and Services Expected Discharge Plan: Home/Self Care   Discharge Planning Services: CM Consult   Living arrangements for the past 2 months: Apartment                                       Social Determinants of Health (SDOH) Interventions    Readmission Risk Interventions No flowsheet data found.

## 2020-12-20 NOTE — Progress Notes (Signed)
Pt received to room 1418 from ICU/SD. Report received from [facility].  No change from previous assessment. Continue plan of care. Asher Torpey, Laurel Dimmer, RN

## 2020-12-20 NOTE — Progress Notes (Signed)
  Speech Language Pathology Treatment: Dysphagia  Patient Details Name: Casey Gates. MRN: [id number] DOB: 16-Apr-1926 Today's Date: 12/20/2020 Time: 1884-1660 SLP Time Calculation (min) (ACUTE ONLY): 11 min  Assessment / Plan / Recommendation Clinical Impression  SLP follow up to assess po tolerance and follow up with care plan following palliative meeting.  Pt confused, lying in bed, stating he needs to "call his son because he has work to do".  SLP advised that he would be moved to another floor in a small amount of time and SlP was following up re: his swallowing ability.  Pt remains dysarthric but improved.  Noted lunch tray at bedside with spaghetti and brocolli remaining.  Pt stated he "could eat something".  Provided him with intake of nectar thick liquids via straw -after which he had NO clinical indications of aspiration and overall timely swallow.  Pt will need total assist and strict compliance to aspiration precautins.  Given pt's pna, h/o dysphagia and family desire to continue full scope of treatment considering MBS may help elucidate dysphagia source and determine LRD. NO PO documented.   Will address potential MBS with MD next date.  In the interim, recommend continue nectar thick liquids with precautions.  SLP informed pt of recommendations but he was too distracted by personal needs to focus.    HPI HPI: 87 year old male with medical history of gastric cancer, afib on anticoagulation, OSA, and chronic pain syndrome, COVID previously.   He presented to the ED due to AMS. CXR in the ED showed RLL opacity. CT chest showed patchy peribronchovascular opacity suspicious for PNA, small to moderate-sized partly loculated R pleural effusion, and significant atelectasis of the RLL, with a smaller area of atelectasis/scarring of the R middle lobe - s/p thoracentesis.  Pt with decreased attention/cognition per son's information shared with MD.  He has h/o dysphagia dating back to 2014- with  rec for double swallows, chin tuck due to decreased UES opening. Esophagram completed showed UES spasm, persistent tertiary contractions and hiatal hernia. Swallow eval ordered.      SLP Plan  MBS (consider MBS on 12/21/2020)       Recommendations  Diet recommendations: Dysphagia 3 (mechanical soft);Nectar-thick liquid Medication Administration: Whole meds with puree Compensations: Slow rate;Small sips/bites Postural Changes and/or Swallow Maneuvers: Out of bed for meals;Seated upright 90 degrees                Oral Care Recommendations: Oral care QID (oral suction after meals) SLP Visit Diagnosis: Dysphagia, oropharyngeal phase (R13.12);Dysphagia, pharyngoesophageal phase (R13.14) Plan: MBS (consider MBS on 12/21/2020)       GO               Casey Lime, MS [place] Office [phone] Pager [phone]   Casey Gates 12/20/2020, 3:35 PM

## 2020-12-20 NOTE — Progress Notes (Signed)
[facility]  PROGRESS NOTE  Casey Gates. LOV:[id number] DOB: 04-28-1926 DOA: 12/17/2020 PCP: Velna Hatchet, MD   Brief HPI:   87 year old male with history of atrial fibrillation on anticoagulation, obstructive sleep apnea, chronic pain syndrome presented to ED for altered mental status. He was found to have right lower lung opacity on chest x-ray. CT of the chest with contrast showed patchy peribronchovascular opacity in the left lower lobe suspicious for pneumonia. Small to moderate size partly loculated right pleural effusion. He was started on IV Rocephin and Zithromax, IR was consulted for thoracentesis. Tumor team mL of fluid was removed. He was found to have loculated pleural effusion. As per lights criteria it was a grade of effusion. Patient also developed A. fib with RVR, started on Cardizem gtt. at 15 mg/h.    Subjective   Patient seen and examined, continues to be lethargic. Denies any complaints.   Assessment/Plan:     1. Sepsis secondary to left lower lobe pneumonia-patient presented with sepsis with leukocytosis, tachycardia. CT chest showed left lower lobe pneumonia. He was started on IV Rocephin and IV Zithromax. Continue supplemental oxygen via nasal cannula. 2. Atrial fibrillation with RVR-rate controlled with Cardizem gtt. and Eliquis for anticoagulation. Cardiology following. Plan to transition back to home diltiazem dose of 120 mg p.o. twice daily. 3. Hypernatremia-resolved, today sodium is 884. 4. Metabolic encephalopathy-slowly improving, likely from infectious etiology from pneumonia as above. 5. Right moderate loculated pleural effusion-ultrasound-guided thoracentesis ordered which was removed to 25 cc of hazy amber fluid. LDH 1318, appears to be exudative. 6. Obstructive sleep apnea-continue CPAP 7. Goals of care discussion-palliative care was consulted, patient is DNR, continue full scope of care.     COVID-19 Labs  Recent Labs     12/19/20 0400  LDH 160    Lab Results  Component Value Date   SARSCOV2NAA NEGATIVE 12/17/2020   [place] NEGATIVE 07/27/2020     Scheduled medications:   . apixaban  2.5 mg Oral BID  . artificial tears  1 application Left Eye TID  . Chlorhexidine Gluconate Cloth  6 each Topical Daily  . Chlorhexidine Gluconate Cloth  6 each Topical Q0600  . feeding supplement  237 mL Oral BID BM  . mirtazapine  30 mg Oral QHS  .  morphine injection  2 mg Intravenous Once  . mupirocin ointment  1 application Nasal BID  . naloxegol oxalate  25 mg Oral Daily         CBG: No results for input(s): GLUCAP in the last 168 hours.  SpO2: 94 %    CBC: Recent Labs  Lab 12/17/20 1138 12/18/20 0425 12/19/20 0400 12/20/20 0259  WBC 22.9* 22.1* 20.8* 18.9*  NEUTROABS 20.1*  --  17.7* 15.4*  HGB 13.1 12.7* 12.5* 12.6*  HCT 42.6 40.6 39.4 41.7  MCV 92.4 91.9 90.8 93.1  PLT 262 294 358 166    Basic Metabolic Panel: Recent Labs  Lab 12/17/20 1138 12/18/20 0425 12/19/20 0400 12/20/20 0259  NA 147* 149* 147* 144  K 4.0 3.8 3.3* 3.5  CL 108 111 109 109  CO2 33* 26 28 25   GLUCOSE 145* 96 131* 118*  BUN 43* 32* 20 17  CREATININE 1.13 0.84 0.79 0.83  CALCIUM 9.5 8.7* 8.3* 8.2*  MG  --  2.2  --   --      Liver Function Tests: Recent Labs  Lab 12/17/20 1138 12/18/20 0425 12/20/20 0259  AST 54* 52* 45*  ALT 33 34 36  ALKPHOS 81 82 76  BILITOT 0.8 0.7 0.7  PROT 7.0 6.0* 5.6*  ALBUMIN 3.1* 2.6* 2.5*     Antibiotics: Anti-infectives (From admission, onward)   Start     Dose/Rate Route Frequency Ordered Stop   12/19/20 1100  cefTRIAXone (ROCEPHIN) 1 g in sodium chloride 0.9 % 100 mL IVPB  Status:  Discontinued        1 g 200 mL/hr over 30 Minutes Intravenous Every 24 hours 12/18/20 1106 12/18/20 1108   12/18/20 1500  azithromycin (ZITHROMAX) 500 mg in sodium chloride 0.9 % 250 mL IVPB        500 mg 250 mL/hr over 60 Minutes Intravenous Every 24 hours 12/18/20 1106      12/18/20 1200  cefTRIAXone (ROCEPHIN) 1 g in sodium chloride 0.9 % 100 mL IVPB        1 g 200 mL/hr over 30 Minutes Intravenous Every 24 hours 12/18/20 1108     12/17/20 1445  azithromycin (ZITHROMAX) 500 mg in sodium chloride 0.9 % 250 mL IVPB        500 mg 250 mL/hr over 60 Minutes Intravenous  Once 12/17/20 1430 12/17/20 1840   12/17/20 1030  cefTRIAXone (ROCEPHIN) 1 g in sodium chloride 0.9 % 100 mL IVPB        1 g 200 mL/hr over 30 Minutes Intravenous  Once 12/17/20 1020 12/17/20 1305       DVT prophylaxis: Eliquis  Code Status: DNR  Family Communication: No family at bedside   Consultants:  Palliative care  Cardiology  Procedures:  Thoracentesis    Objective   Vitals:   12/20/20 0905 12/20/20 1000 12/20/20 1100 12/20/20 1200  BP:  (!) 118/57 (!) 102/54 114/60  Pulse:  97 (!) 111 (!) 112  Resp:  18 19 (!) 25  Temp: 98.2 F (36.8 C)     TempSrc: Oral     SpO2:  90% 91% 94%  Weight:      Height:        Intake/Output Summary (Last 24 hours) at 12/20/2020 1308 Last data filed at 12/20/2020 1300 Gross per 24 hour  Intake 3365.76 ml  Output 625 ml  Net 2740.76 ml    01/17 1901 - 01/19 0700 In: 4199.3 [P.O.:118; I.V.:3341.5] Out: 625 [Urine:625]  Filed Weights   12/17/20 2323 12/19/20 0635  Weight: 63.5 kg 57.7 kg    Physical Examination:   General-appears lethargic Heart-S1-S2, regular, no murmur auscultated Lungs-decreased breath sounds bilaterally Abdomen-soft, nontender, no organomegaly Extremities-no edema in the lower extremities Neuro-alert, oriented x2, moving all extremities  Status is: Inpatient  Dispo: The patient is from: Skilled nursing facility              Anticipated d/c is to: Skilled nursing facility              Anticipated d/c date is: 12/24/2020              Patient currently not medically stable for discharge  Barrier to discharge-       Data Reviewed:   Recent Results (from the past 240 hour(s))  Culture, blood  (routine x 2)     Status: None (Preliminary result)   Collection Time: 12/17/20 10:20 AM   Specimen: BLOOD  Result Value Ref Range Status   Specimen Description   Final    BLOOD BLOOD RIGHT FOREARM Performed at [facility], [place] [address] [place], [place] [address]    Special Requests   Final  BOTTLES DRAWN AEROBIC AND ANAEROBIC Blood Culture adequate volume Performed at [place] [address] [place], [place] [address]    Culture   Final    NO GROWTH 3 DAYS Performed at [facility], [place] [address] [place], [place] [address]    Report Status PENDING  Incomplete  Culture, blood (routine x 2)     Status: None (Preliminary result)   Collection Time: 12/17/20 10:25 AM   Specimen: BLOOD  Result Value Ref Range Status   Specimen Description   Final    BLOOD BLOOD LEFT FOREARM Performed at [place] [address] [place], [place] [address]    Special Requests   Final    BOTTLES DRAWN AEROBIC AND ANAEROBIC Blood Culture results may not be optimal due to an inadequate volume of blood received in culture bottles Performed at [place] [address] [place], [place] [address]    Culture   Final    NO GROWTH 3 DAYS Performed at [facility], [place] [address] [place], [place] [address]    Report Status PENDING  Incomplete  SARS CORONAVIRUS 2 (TAT 6-24 HRS) Nasopharyngeal Nasopharyngeal Swab     Status: None   Collection Time: 12/17/20  3:02 PM   Specimen: Nasopharyngeal Swab  Result Value Ref Range Status   SARS Coronavirus 2 NEGATIVE NEGATIVE Final    Comment: (NOTE) SARS-CoV-2 target nucleic acids are NOT DETECTED.  The SARS-CoV-2 RNA is generally detectable in upper and lower respiratory specimens during the acute phase of infection. Negative results do not preclude SARS-CoV-2 infection, do not rule out co-infections with other pathogens, and should not be used as  the sole basis for treatment or other patient management decisions. Negative results must be combined with clinical observations, patient history, and epidemiological information. The expected result is Negative.  Fact Sheet for Patients: SugarRoll.be  Fact Sheet for Healthcare Providers: https://www.woods-mathews.com/  This test is not yet approved or cleared by the [place] FDA and  has been authorized for detection and/or diagnosis of SARS-CoV-2 by FDA under an Emergency Use Authorization (EUA). This EUA will remain  in effect (meaning this test can be used) for the duration of the COVID-19 declaration under Se ction 564(b)(1) of the Act, 21 U.S.C. section 360bbb-3(b)(1), unless the authorization is terminated or revoked sooner.  Performed at [facility], [place] [address] [place], [place] [address]   Body fluid culture     Status: None (Preliminary result)   Collection Time: 12/18/20  1:27 PM   Specimen: PATH Cytology Pleural fluid  Result Value Ref Range Status   Specimen Description   Final    PLEURAL Performed at [place] [address], [place], [place] [address]    Special Requests   Final    NONE Performed at [facility], [place] [address], [place], [place] [address]    Gram Stain NO WBC SEEN NO ORGANISMS SEEN   Final   Culture   Final    NO GROWTH 2 DAYS Performed at [facility], [place] [address] [place], [place] [address]    Report Status PENDING  Incomplete  MRSA PCR Screening     Status: Abnormal   Collection Time: 12/19/20  6:23 AM   Specimen: Nasopharyngeal  Result Value Ref Range Status   MRSA by PCR POSITIVE (A) NEGATIVE Final    Comment:        The  GeneXpert MRSA Assay (FDA approved for NASAL specimens only), is one component of a comprehensive MRSA colonization surveillance program. It is not intended to diagnose MRSA infection nor to guide  or monitor treatment for MRSA infections. RESULT CALLED TO, READ BACK BY AND VERIFIED WITH: CODLE,L. RN @1315  ON 01.18.2022 BY COHEN,K Performed at [facility], [place] [address], [place], [place] [address]      Studies:  DG Chest 1 View  Result Date: 12/18/2020 CLINICAL DATA:  Post thesis EXAM: CHEST  1 VIEW COMPARISON:  Portable exam 1356 hours compared to 12/17/2020 FINDINGS: Upper normal size of cardiac silhouette. Mediastinal contours and pulmonary vascularity normal. Atherosclerotic calcification aorta. Partially loculated RIGHT pleural effusion again identified. No pneumothorax following thoracentesis. Persistent atelectasis versus infiltrate at medial LEFT lower lobe. Bones demineralized. IMPRESSION: No pneumothorax following thoracentesis. Persistent partially loculated RIGHT pleural effusion and LEFT basilar atelectasis versus infiltrate. Electronically Signed   By: Lavonia Dana M.D.   On: 12/18/2020 14:06   US THORACENTESIS ASP PLEURAL SPACE W/IMG GUIDE  Result Date: 12/18/2020 INDICATION: Patient with history of pneumonia, encephalopathy, atrial fibrillation, loculated right pleural effusion; request received for diagnostic and therapeutic right thoracentesis. EXAM: ULTRASOUND GUIDED DIAGNOSTIC AND THERAPEUTIC RIGHT THORACENTESIS MEDICATIONS: 1% lidocaine to skin and subcutaneous tissue COMPLICATIONS: None immediate. PROCEDURE: An ultrasound guided thoracentesis was thoroughly discussed with the patient's son and questions answered. The benefits, risks, alternatives and complications were also discussed. The patient's son understands and wishes to proceed with the procedure. Written consent was obtained. Ultrasound was performed to localize and mark an adequate pocket of fluid in the right chest. The area was then prepped and draped in the normal sterile fashion. 1% Lidocaine was used for local anesthesia. Under ultrasound guidance a 6 Fr Safe-T-Centesis catheter was  introduced. Thoracentesis was performed. The catheter was removed and a dressing applied. FINDINGS: A total of approximately 225 cc of hazy,amber fluid was removed. Samples were sent to the laboratory as requested by the clinical team. The pleural effusion was multiloculated by ultrasound today. IMPRESSION: Successful ultrasound guided diagnostic and therapeutic right thoracentesis yielding 225 cc of pleural fluid. Read by: Rowe Robert, PA-C Electronically Signed   By: Sandi Mariscal M.D.   On: 12/18/2020 13:35       [place]   [facility] If 7PM-7AM, please contact night-coverage at [email or web address], Office  [phone]   12/20/2020, 1:08 PM  LOS: 3 days

## 2020-12-20 NOTE — Progress Notes (Signed)
   12/20/20 2145  Assess: MEWS Score  Temp 99.8 F (37.7 C)  BP 114/69  Pulse Rate (!) 111  Resp 18  SpO2 90 %  O2 Device Room Air  Assess: MEWS Score  MEWS Temp 0  MEWS Systolic 0  MEWS Pulse 2  MEWS RR 0  MEWS LOC 0  MEWS Score 2  MEWS Score Color Yellow  Assess: if the MEWS score is Yellow or Red  Were vital signs taken at a resting state? Yes  Focused Assessment No change from prior assessment  Early Detection of Sepsis Score *See Row Information* Low  MEWS guidelines implemented *See Row Information* Yes  Treat  MEWS Interventions Administered scheduled meds/treatments  Pain Scale 0-10  Pain Score 5  Pain Type Chronic pain  Pain Location Leg  Pain Orientation Right  Pain Descriptors / Indicators Sore;Aching  Pain Frequency Rarely  Pain Onset Awakened from sleep  Patients Stated Pain Goal 4  Pain Intervention(s) Medication (See eMAR)  Take Vital Signs  Increase Vital Sign Frequency  Yellow: Q 2hr X 2 then Q 4hr X 2, if remains yellow, continue Q 4hrs  Notify: Charge Nurse/RN  Name of Charge Nurse/RN Notified Chloe Addison  Date Charge Nurse/RN Notified 12/20/20  Time Charge Nurse/RN Notified 2217

## 2020-12-20 NOTE — Progress Notes (Signed)
Consultation Note Date: 12/20/2020   Patient Name: Casey Gates.  DOB: Mar 06, 1926  MRN: [id number]  Age / Sex: 87 y.o., male  PCP: Velna Hatchet, MD Referring Physician: Oswald Hillock, MD  Reason for Consultation: Establishing goals of care  HPI/Patient Profile: 87 y.o. male  with past medical history of atrial fibrillation, GIST tumor of stomach (s/p partial gastrectomy, tx'd with [place]- last CT showed no evidence of recurrence), R nephrectomy, L eye blindness, CKD, chronic pain, iron deficient anemia, chronic constipation, depression with decreased appetite, dysphagia, living at assisted living facility [facility]), recent COVID-19 infection at the end of December- admitted on 12/17/2020 with increased confusion.  Workup revealed sepsis d/t pneumonia with loculated pleural effusion and a fib with RVR. Underwent thoracentesis yielding 225 cc fluid. HR improving with cardizem infusion. He remains confused. Has decreased oral intake- SLP evaluation shows chronic dysphagia, weak cough, but no overt signs of aspiration- recommendations for dysphagia management made. Palliative medicine consulted for goals of care.     Clinical Assessment and Goals of Care:  I have reviewed medical records including EPIC notes, labs and imaging, received report from nursing staff, examined the patient and met at bedside with patient's son- [name],  to discuss diagnosis prognosis, GOC, EOL wishes, disposition and options.  I introduced Palliative Medicine as specialized medical care for people living with serious illness. It focuses on providing relief from the symptoms and stress of a serious illness.   Prior to this admission patient was living in assisted living facility. He was mentally clear- enjoyed reading the [place] times and [place]. He has had decrease in his functional status over the last several months- Prior  to September he was living at home independently, but at that time was noted to have falls and was frequently calling his son for assistance with personal care and so at that time decision was made for assisted living. Chart review does show ongoing weight loss and decreased PO intake.   We discussed his current illness and what it means in the larger context of his on-going co-morbidities.  Natural disease trajectory and possible outcomes were discussed- specifically the possibility of ongoing decline, and the possibility of repeated aspiration pneumonia with continued functional decline.   I attempted to elicit values and goals of care important to the patient.  Attempted to discuss what patient would value as quality of life- [name] was not able to answer this at the time.   [name] stated at this time goals are to continue medical interventions that he hopes will return patient to place where he can discharge back to his assisted living. He shares that patient has long term care insurance and understands that patient may need higher level of care- possibly a period of rehab and possibly needing skilled care.  Advanced directives, concepts specific to code status, artifical feeding and hydration, and rehospitalization were considered and discussed. Patient has living will expressing his desire for natural death if were in terminal state. I reviewed option of completing  MOST form with patient if his mental status improves- and completing it with son if mental status does not.   Outpatient Palliative services were offered.   Questions and concerns were addressed.  The family was encouraged to call with questions or concerns.   Primary Decision Maker NEXT OF KIN- Son, [name]    SUMMARY OF RECOMMENDATIONS -Continue full scope care -Pt is DNR, has living will on file -GOC are to make attempts to restore previous mental status and return to prior living situation- son is open to the fact that patient may  need higher level of care based on outcomes -PMT will chart check and will meet with family again if patient looks to be declining -Recommend outpatient Palliative follow-up on discharge    Code Status/Advance Care Planning:  DNR  Palliative Prophylaxis:   Aspiration and Delirium Protocol  Additional Recommendations (Limitations, Scope, Preferences):  Full Scope Treatment  Prognosis:    Unable to determine- if he continues to have repeated aspiration pneumonia events then he could have limited prognosis- I also fear he is having somewhat of a post-covid syndrome (even though he had asymptomatic infection- have unfortunately seen elderly patients with failure to thrive after Covid- decreased po intake, weight loss, confusion)  Discharge Planning: To Be Determined  Primary Diagnoses: Present on Admission: . Sepsis ([place]) . Multifocal pneumonia   I have reviewed the medical record, interviewed the patient and family, and examined the patient. The following aspects are pertinent.  Past Medical History:  Diagnosis Date  . Anemia   . Asthma    AS A CHILD  . Blind left eye   . Cancer [facility])    PROSTATE CANCER  . Cellulitis    frequently, "all over"  . Chronic pain syndrome   . Chronic ulcer of toe, right, with unspecified severity ([place])    2nd toe  . Constipation   . DDD (degenerative disc disease), lumbar   . Gastrointestinal stromal tumor (GIST) of stomach ([place])   . GI bleeding   . History of blood transfusion    01/2018  . Insomnia   . Kyphoscoliosis   . Mass in the abdomen 01/2018  . Persistent atrial fibrillation ([place])   . Restless leg   . Sleep apnea    does not wear CPAP  . Wears glasses    Social History   Socioeconomic History  . Marital status: Married    Spouse name: Not on file  . Number of children: 2  . Years of education: Not on file  . Highest education level: Not on file  Occupational History  . Not on file  Tobacco Use  . Smoking status:  Never Smoker  . Smokeless tobacco: Never Used  Vaping Use  . Vaping Use: Never used  Substance and Sexual Activity  . Alcohol use: Not Currently    Comment: quit 1985, 2 fifths/ week,S/P rehab  . Drug use: Never  . Sexual activity: Not Currently  Other Topics Concern  . Not on file  Social History Narrative   ** Merged History Encounter **       Caffeine 2-3 cups average.  Widowed,  Lives at [facility] [place].  Retired Forensic psychologist.  One son.   Social Determinants of Health   Financial Resource Strain: Not on file  Food Insecurity: Not on file  Transportation Needs: Not on file  Physical Activity: Not on file  Stress: Not on file  Social Connections: Not on file   Scheduled Meds: .  apixaban  2.5 mg Oral BID  . artificial tears  1 application Left Eye TID  . Chlorhexidine Gluconate Cloth  6 each Topical Daily  . Chlorhexidine Gluconate Cloth  6 each Topical Q0600  . feeding supplement  237 mL Oral BID BM  . mirtazapine  30 mg Oral QHS  .  morphine injection  2 mg Intravenous Once  . mupirocin ointment  1 application Nasal BID  . naloxegol oxalate  25 mg Oral Daily   Continuous Infusions: . azithromycin Stopped (12/19/20 1525)  . cefTRIAXone (ROCEPHIN)  IV Stopped (12/19/20 1245)  . dextrose 5 % and 0.45% NaCl 75 mL/hr at 12/19/20 1212  . diltiazem (CARDIZEM) infusion 15 mg/hr (12/19/20 1540)   PRN Meds:.acetaminophen **OR** acetaminophen, gabapentin, ipratropium-albuterol, morphine, ondansetron **OR** ondansetron (ZOFRAN) IV, Resource ThickenUp Clear Medications Prior to Admission:  Prior to Admission medications   Medication Sig Start Date End Date Taking? Authorizing Provider  apixaban (ELIQUIS) 2.5 MG TABS tablet Take 1 tablet (2.5 mg total) by mouth 2 (two) times daily. 07/24/20  Yes Sherran Needs, NP  diltiazem (CARDIZEM SR) 120 MG 12 hr capsule Take 1 capsule (120 mg total) by mouth 2 (two) times daily. 07/24/20 08/24/20 Yes Sherran Needs, NP   gabapentin (NEURONTIN) 300 MG capsule Take 300 mg by mouth 3 (three) times daily as needed (legs-per MAR). Restless leg 05/14/20  Yes [provider]  mirtazapine (REMERON) 30 MG tablet Take 30 mg by mouth at bedtime. 11/21/20  Yes [provider]  MOVANTIK 25 MG TABS tablet Take 25 mg by mouth daily. 09/10/19  Yes [provider]  oxymorphone (OPANA) 10 MG tablet Take 10 mg by mouth every 6 (six) hours as needed for pain.  07/03/18  Yes [provider]  Jay Schlichter Oil (ARTIFICIAL TEARS) ointment Place 1 application into the left eye 3 (three) times daily. Left lower eye lid   Yes [provider]   Allergies  Allergen Reactions  . Coconut Flavor [Flavoring Agent] Hypertension    Anything that is related to coconut---LOSS OF CONSCIOUSNESS ([place])  . Coconut Oil Other (See Comments)    Anything that is related to coconut--- LOSS OF CONSCIOUSNESS ([place])  . Doxycycline Hyclate Hives  . Food Anaphylaxis    ALLERGIC TO ALL NUTS WITH THE EXCEPTION OF CASHEWS, ALMONDS  . Black [employer]  Other (See Comments)    Passes out ( can only eat almonds or peanuts)  . Coconut Oil Other (See Comments)    Faints   . Doxycycline Rash   Review of Systems  Unable to perform ROS   Physical Exam Vitals and nursing note reviewed.  Constitutional:      Appearance: He is ill-appearing.     Comments: Frail, thin  Cardiovascular:     Rate and Rhythm: Tachycardia present.  Pulmonary:     Effort: Pulmonary effort is normal.  Neurological:     Mental Status: He is disoriented.     Vital Signs: BP (!) 102/54   Pulse (!) 111   Temp 98.2 F (36.8 C) (Oral)   Resp 19   Ht $R'5\' 11"'TT$  (1.803 m)   Wt 57.7 kg   SpO2 91%   BMI 17.74 kg/m  Pain Scale: PAINAD   Pain Score: Asleep   SpO2: SpO2: 91 % O2 Device:SpO2: 91 % O2 Flow Rate: .   IO: Intake/output summary:   Intake/Output Summary (Last 24 hours) at 12/20/2020 1208 Last data filed  at 12/20/2020 0300 Gross per 24  hour  Intake 2365.78 ml  Output 625 ml  Net 1740.78 ml    LBM: Last BM Date: 12/19/20 Baseline Weight: Weight: 63.5 kg Most recent weight: Weight: 57.7 kg     Palliative Assessment/Data: PPS: 30%     Thank you for this consult. Palliative medicine will continue to follow and assist as needed.   Time In: 1042 Time Out: 1159 Time Total: 77 mins Greater than 50%  of this time was spent counseling and coordinating care related to the above assessment and plan.  Signed by: Mariana Kaufman, AGNP-C Palliative Medicine    Please contact Palliative Medicine Team phone at [phone] for questions and concerns.  For individual provider: See Shea Evans

## 2020-12-21 DIAGNOSIS — E870 Hyperosmolality and hypernatremia: Secondary | ICD-10-CM | POA: Diagnosis not present

## 2020-12-21 DIAGNOSIS — J189 Pneumonia, unspecified organism: Secondary | ICD-10-CM | POA: Diagnosis not present

## 2020-12-21 DIAGNOSIS — I4891 Unspecified atrial fibrillation: Secondary | ICD-10-CM | POA: Diagnosis not present

## 2020-12-21 DIAGNOSIS — Z7189 Other specified counseling: Secondary | ICD-10-CM | POA: Diagnosis not present

## 2020-12-21 DIAGNOSIS — G9341 Metabolic encephalopathy: Secondary | ICD-10-CM | POA: Diagnosis not present

## 2020-12-21 LAB — GLUCOSE, CAPILLARY
Glucose-Capillary: 103 mg/dL — ABNORMAL HIGH (ref 70–99)
Glucose-Capillary: 89 mg/dL (ref 70–99)

## 2020-12-21 MED ORDER — AZITHROMYCIN 250 MG PO TABS
500.0000 mg | ORAL_TABLET | Freq: Every day | ORAL | Status: DC
Start: 2020-12-21 — End: 2020-12-22
  Administered 2020-12-21: 500 mg via ORAL
  Filled 2020-12-21: qty 2

## 2020-12-21 NOTE — Progress Notes (Addendum)
PHARMACIST - PHYSICIAN COMMUNICATION  CONCERNING: Antibiotic IV to Oral Route Change Policy  RECOMMENDATION: This patient is receiving azithromycin by the intravenous route.  Based on criteria approved by the Pharmacy and Therapeutics Committee, the antibiotic(s) is/are being converted to the equivalent oral dose form(s).   DESCRIPTION: These criteria include:  Patient being treated for a respiratory tract infection, urinary tract infection, cellulitis or clostridium difficile associated diarrhea if on metronidazole  The patient is not neutropenic and does not exhibit a GI malabsorption state  The patient is eating (either orally or via tube) and/or has been taking other orally administered medications for a least 24 hours  The patient is improving clinically and has a Tmax < 100.5  If you have questions about this conversion, please contact the Pharmacy Department  []  ( [phone] )  [place] []  ( [phone] )  [facility] []  ( [phone]   []  ( [phone] )  [facility] [x]  ( [phone] )  [facility]  

## 2020-12-21 NOTE — Plan of Care (Signed)
  Problem: Clinical Measurements: Goal: Diagnostic test results will improve Outcome: Adequate for Discharge   

## 2020-12-21 NOTE — Progress Notes (Signed)
[facility]  PROGRESS NOTE  Casey Gates. [id number] DOB: 12/31/1925 DOA: 12/17/2020 PCP: Velna Hatchet, MD   Brief HPI:   87 year old male with history of atrial fibrillation on anticoagulation, obstructive sleep apnea, chronic pain syndrome presented to ED for altered mental status. He was found to have right lower lung opacity on chest x-ray. CT of the chest with contrast showed patchy peribronchovascular opacity in the left lower lobe suspicious for pneumonia. Small to moderate size partly loculated right pleural effusion. He was started on IV Rocephin and Zithromax, IR was consulted for thoracentesis. Tumor team mL of fluid was removed. He was found to have loculated pleural effusion. As per lights criteria it was a grade of effusion. Patient also developed A. fib with RVR, started on Cardizem gtt. at 15 mg/h.    Subjective   Patient seen and examined, very somnolent.   Assessment/Plan:     1. Sepsis secondary to left lower lobe pneumonia-patient presented with sepsis with leukocytosis, tachycardia. CT chest showed left lower lobe pneumonia. He was started on IV Rocephin and IV Zithromax. Continue supplemental oxygen via nasal cannula. 2. Atrial fibrillation with RVR-rate controlled with Cardizem gtt. and Eliquis for anticoagulation. Cardiology following. Plan to transition back to home diltiazem dose of 120 mg p.o. twice daily once able to take p.o. 3. Hypernatremia-resolved, today sodium is 123456. 4. Metabolic encephalopathy-slowly improving, likely from infectious etiology from pneumonia as above. Patient also was on morphine 30 mg every 6 hours as needed. He received morphine last night. Will discontinue morphine at this time. As per patient's son patient takes oxymorphone 10 mg 4 times a day as needed for pain. 5. Right moderate loculated pleural effusion-ultrasound-guided thoracentesis ordered which was removed to 25 cc of hazy amber fluid. LDH 1318, appears to be  exudative. 6. Obstructive sleep apnea-continue CPAP 7. Goals of care discussion-palliative care was consulted, patient is DNR, continue full scope of care.     COVID-19 Labs  Recent Labs    12/19/20 0400  LDH 160    Lab Results  Component Value Date   SARSCOV2NAA NEGATIVE 12/17/2020   [place] NEGATIVE 07/27/2020     Scheduled medications:   . apixaban  2.5 mg Oral BID  . artificial tears  1 application Left Eye TID  . azithromycin  500 mg Oral Daily  . Chlorhexidine Gluconate Cloth  6 each Topical Daily  . Chlorhexidine Gluconate Cloth  6 each Topical Q0600  . feeding supplement  237 mL Oral BID BM  . mirtazapine  30 mg Oral QHS  . mupirocin ointment  1 application Nasal BID  . naloxegol oxalate  25 mg Oral Daily         CBG: Recent Labs  Lab 12/20/20 2143 12/21/20 0039 12/21/20 0510  GLUCAP 104* 103* 89    SpO2: 91 %    CBC: Recent Labs  Lab 12/17/20 1138 12/18/20 0425 12/19/20 0400 12/20/20 0259  WBC 22.9* 22.1* 20.8* 18.9*  NEUTROABS 20.1*  --  17.7* 15.4*  HGB 13.1 12.7* 12.5* 12.6*  HCT 42.6 40.6 39.4 41.7  MCV 92.4 91.9 90.8 93.1  PLT 262 294 358 0000000    Basic Metabolic Panel: Recent Labs  Lab 12/17/20 1138 12/18/20 0425 12/19/20 0400 12/20/20 0259  NA 147* 149* 147* 144  K 4.0 3.8 3.3* 3.5  CL 108 111 109 109  CO2 33* 26 28 25   GLUCOSE 145* 96 131* 118*  BUN 43* 32* 20 17  CREATININE 1.13 0.84 0.79 0.83  CALCIUM 9.5 8.7* 8.3* 8.2*  MG  --  2.2  --   --      Liver Function Tests: Recent Labs  Lab 12/17/20 1138 12/18/20 0425 12/20/20 0259  AST 54* 52* 45*  ALT 33 34 36  ALKPHOS 81 82 76  BILITOT 0.8 0.7 0.7  PROT 7.0 6.0* 5.6*  ALBUMIN 3.1* 2.6* 2.5*     Antibiotics: Anti-infectives (From admission, onward)   Start     Dose/Rate Route Frequency Ordered Stop   12/21/20 1400  azithromycin (ZITHROMAX) tablet 500 mg        500 mg Oral Daily 12/21/20 1150     12/19/20 1100  cefTRIAXone (ROCEPHIN) 1 g in sodium  chloride 0.9 % 100 mL IVPB  Status:  Discontinued        1 g 200 mL/hr over 30 Minutes Intravenous Every 24 hours 12/18/20 1106 12/18/20 1108   12/18/20 1500  azithromycin (ZITHROMAX) 500 mg in sodium chloride 0.9 % 250 mL IVPB  Status:  Discontinued        500 mg 250 mL/hr over 60 Minutes Intravenous Every 24 hours 12/18/20 1106 12/21/20 1150   12/18/20 1200  cefTRIAXone (ROCEPHIN) 1 g in sodium chloride 0.9 % 100 mL IVPB        1 g 200 mL/hr over 30 Minutes Intravenous Every 24 hours 12/18/20 1108     12/17/20 1445  azithromycin (ZITHROMAX) 500 mg in sodium chloride 0.9 % 250 mL IVPB        500 mg 250 mL/hr over 60 Minutes Intravenous  Once 12/17/20 1430 12/17/20 1840   12/17/20 1030  cefTRIAXone (ROCEPHIN) 1 g in sodium chloride 0.9 % 100 mL IVPB        1 g 200 mL/hr over 30 Minutes Intravenous  Once 12/17/20 1020 12/17/20 1305       DVT prophylaxis: Eliquis  Code Status: DNR  Family Communication: Discussed with patient's son at bedside.   Consultants:  Palliative care  Cardiology  Procedures:  Thoracentesis    Objective   Vitals:   12/21/20 0041 12/21/20 0145 12/21/20 0506 12/21/20 0954  BP:  (!) 107/58 (!) 110/59 95/68  Pulse: (!) 112 (!) 121 98 93  Resp:  18 15   Temp: 99.2 F (37.3 C) 99 F (37.2 C) 99 F (37.2 C) 98.6 F (37 C)  TempSrc: Oral Oral Oral Axillary  SpO2: (!) 89% 94% 91% 91%  Weight:      Height:        Intake/Output Summary (Last 24 hours) at 12/21/2020 1307 Last data filed at 12/21/2020 0954 Gross per 24 hour  Intake 1751.48 ml  Output --  Net 1751.48 ml    01/18 1901 - 01/20 0700 In: 3818.9 [P.O.:118; I.V.:3350.9] Out: 625 [Urine:625]  Filed Weights   12/17/20 2323 12/19/20 0635  Weight: 63.5 kg 57.7 kg    Physical Examination:   General-somnolent, briefly opens eyes on sternal rub and then closes again Heart-S1-S2, regular, no murmur auscultated Lungs-clear to auscultation bilaterally, no wheezing or crackles  auscultated Abdomen-soft, nontender, no organomegaly Extremities-no edema in the lower extremities Neuro-somnolent  Status is: Inpatient  Dispo: The patient is from: Skilled nursing facility              Anticipated d/c is to: Skilled nursing facility              Anticipated d/c date is: 12/24/2020              Patient currently  not medically stable for discharge  Barrier to discharge-       Data Reviewed:   Recent Results (from the past 240 hour(s))  Culture, blood (routine x 2)     Status: None (Preliminary result)   Collection Time: 12/17/20 10:20 AM   Specimen: BLOOD  Result Value Ref Range Status   Specimen Description   Final    BLOOD BLOOD RIGHT FOREARM Performed at [facility] [address] [place], [place] [address]    Special Requests   Final    BOTTLES DRAWN AEROBIC AND ANAEROBIC Blood Culture adequate volume Performed at [facility] [address], [place], [place] [address]    Culture   Final    NO GROWTH 4 DAYS Performed at [facility], [place] [address] [place], [place] [address]    Report Status PENDING  Incomplete  Culture, blood (routine x 2)     Status: None (Preliminary result)   Collection Time: 12/17/20 10:25 AM   Specimen: BLOOD  Result Value Ref Range Status   Specimen Description   Final    BLOOD BLOOD LEFT FOREARM Performed at [facility] [address], [place], [place] [address]    Special Requests   Final    BOTTLES DRAWN AEROBIC AND ANAEROBIC Blood Culture results may not be optimal due to an inadequate volume of blood received in culture bottles Performed at [facility] [address], [place], [place] [address]    Culture   Final    NO GROWTH 4 DAYS Performed at [facility], [place] [address] [place], [place] [address]    Report Status PENDING  Incomplete  SARS CORONAVIRUS 2 (TAT 6-24 HRS) Nasopharyngeal Nasopharyngeal Swab     Status:  None   Collection Time: 12/17/20  3:02 PM   Specimen: Nasopharyngeal Swab  Result Value Ref Range Status   SARS Coronavirus 2 NEGATIVE NEGATIVE Final    Comment: (NOTE) SARS-CoV-2 target nucleic acids are NOT DETECTED.  The SARS-CoV-2 RNA is generally detectable in upper and lower respiratory specimens during the acute phase of infection. Negative results do not preclude SARS-CoV-2 infection, do not rule out co-infections with other pathogens, and should not be used as the sole basis for treatment or other patient management decisions. Negative results must be combined with clinical observations, patient history, and epidemiological information. The expected result is Negative.  Fact Sheet for Patients: SugarRoll.be  Fact Sheet for Healthcare Providers: https://www.woods-mathews.com/  This test is not yet approved or cleared by the [place] FDA and  has been authorized for detection and/or diagnosis of SARS-CoV-2 by FDA under an Emergency Use Authorization (EUA). This EUA will remain  in effect (meaning this test can be used) for the duration of the COVID-19 declaration under Se ction 564(b)(1) of the Act, 21 U.S.C. section 360bbb-3(b)(1), unless the authorization is terminated or revoked sooner.  Performed at [facility], [place] [address] [place], [place] [address]   Body fluid culture     Status: None (Preliminary result)   Collection Time: 12/18/20  1:27 PM   Specimen: PATH Cytology Pleural fluid  Result Value Ref Range Status   Specimen Description   Final    PLEURAL Performed at [facility] [address], [place], [place] [address]    Special Requests   Final    NONE Performed at [facility], [place] [address], [place],  [address]  Gram Stain NO WBC SEEN NO ORGANISMS SEEN   Final   Culture   Final    NO GROWTH 3 DAYS Performed at [facility], [place]  [address] [place], [place] [address]    Report Status PENDING  Incomplete  MRSA PCR Screening     Status: Abnormal   Collection Time: 12/19/20  6:23 AM   Specimen: Nasopharyngeal  Result Value Ref Range Status   MRSA by PCR POSITIVE (A) NEGATIVE Final    Comment:        The GeneXpert MRSA Assay (FDA approved for NASAL specimens only), is one component of a comprehensive MRSA colonization surveillance program. It is not intended to diagnose MRSA infection nor to guide or monitor treatment for MRSA infections. RESULT CALLED TO, READ BACK BY AND VERIFIED WITH: CODLE,L. RN @1315  ON 01.18.2022 BY COHEN,K Performed at [facility], [place] [address] [place], [place] [address]      Studies:  No results found.     Oswald Hillock   [facility] If 7PM-7AM, please contact night-coverage at [email or web address], Office  [phone]   12/21/2020, 1:07 PM  LOS: 4 days

## 2020-12-21 NOTE — Progress Notes (Signed)
12/19/20 1000  PT Visit Information: PT EVAL :    Assistance Needed +2  PT/OT/SLP Co-Evaluation/Treatment Yes  Reason for Co-Treatment Complexity of the patient's impairments (multi-system involvement)  PT goals addressed during session Mobility/safety with mobility  OT goals addressed during session ADL's and self-care  History of Present Illness Patient is a 87 year old male PMH of A. fib, CKD, chronic pain, OSA (does not use BiPAP) who presented to the hospital 12/17/2020 by son for altered mental status. Per chart had COVID end of Dec 2021  Precautions  Precautions Fall  Precaution Comments mits, VS  Restrictions  Weight Bearing Restrictions No  Home Living  Family/patient expects to be discharged to: [place] Other (Comment)  Available Help at Discharge Personal care attendant  Additional Comments From ALF  Prior Function  Comments ambulates with RW at baseline per chart/per son  Communication  Communication No difficulties  Pain Assessment  Faces Pain Scale 2  Pain Location , non specific,when moving  Pain Descriptors / Indicators Moaning  Cognition  Arousal/Alertness Awake/alert  Behavior During Therapy Anxious;Agitated  Overall Cognitive Status No family/caregiver present to determine baseline cognitive functioning  Area of Impairment Orientation;Attention;Memory;Following commands;Safety/judgement;Awareness  Orientation Level Time;Situation  Current Attention Level Focused  Memory Decreased short-term memory  Following Commands Follows one step commands inconsistently  Safety/Judgement Decreased awareness of safety;Decreased awareness of deficits  Awareness Intellectual  General Comments difficult to have patient focus on sitting activity. patient kept trying to  lie back down  Lower Extremity Assessment  Lower Extremity Assessment  (moves legs in bed, difficult to assess due to participation)  Bed Mobility  Overal bed  mobility Needs Assistance  Bed Mobility Supine to Sit;Sit to Supine  Supine to sit Max assist;+2 for safety/equipment;+2 for physical assistance  Sit to supine Mod assist;+2 for safety/equipment  General bed mobility comments with much encouragement , assisted to sitting on bed edge. Patient leaning backward, trying to lie down. patient did not support self. Patient  placed legs onto bed with mn assistance.  Transfers  Overall transfer level Needs assistance  Balance  Overall balance assessment Needs assistance  Sitting-balance support Feet unsupported;No upper extremity supported  Sitting balance-Leahy Scale Zero  Sitting balance - Comments very restless and not following  Postural control Posterior lean  PT - End of Session  Activity Tolerance Treatment limited secondary to agitation  Patient left in bed;with call bell/phone within reach;with bed alarm set  Nurse Communication Mobility status  PT Assessment  PT Recommendation/Assessment Patient needs continued PT services  PT Visit Diagnosis Unsteadiness on feet (R26.81)  PT Plan  PT Frequency (ACUTE ONLY) Min 3X/week  PT Treatment/Interventions (ACUTE ONLY) Gait training;Functional mobility training;Therapeutic activities  AM-PAC PT "6 Clicks" Mobility Outcome Measure (Version 2)  Help needed turning from your back to your side while in a flat bed without using bedrails? 2  Help needed moving from lying on your back to sitting on the side of a flat bed without using bedrails? 2  Help needed moving to and from a bed to a chair (including a wheelchair)? 2  Help needed standing up from a chair using your arms (e.g., wheelchair or bedside chair)? 2  Help needed to walk in hospital room? 1  Help needed climbing 3-5 steps with a railing?  1  6 Click Score 10  Consider Recommendation of Discharge To: CIR/SNF/LTACH  PT Recommendation  Follow Up Recommendations SNF   PT Eval performed by Tresa Endo,  PT on 12/19/2020 , Clide Dales just  pulled information into note at later date.

## 2020-12-21 NOTE — Progress Notes (Signed)
Physical Therapy Treatment Patient Details Name: Casey Gates. MRN: [id number] DOB: [birth year] Today's Date: 12/21/2020    History of Present Illness Patient is a 87 year old male PMH of A. fib, CKD, chronic pain, OSA (does not use BiPAP) who presented to the hospital 12/17/2020 by son for altered mental status. Per chart had COVID end of Dec 2021    PT Comments    Pt awake and alert on arrival to room however not orientated.  Pt very agreeable to exercise and attempt sitting EOB.  Pt aware he is weak and requires +2 assist, so only able to perform sitting EOB.  Pt reports right leg has had pain since his fall in October.  Pt asking if he ate today and RN agreeable for therapist to provide Ensure, and pt drank at least half of bottle.  Pt fatigued quickly and requested newspaper, but able to settle with listening to [facility] on his television.  Pt will likely require increased assist upon d/c and may need to d/c to SNF instead of back to ALF.   Follow Up Recommendations  SNF     Equipment Recommendations  Wheelchair cushion (measurements PT);Wheelchair (measurements PT)    Recommendations for Other Services       Precautions / Restrictions Precautions Precautions: Fall Precaution Comments: mits    Mobility  Bed Mobility Overal bed mobility: Needs Assistance Bed Mobility: Supine to Sit;Sit to Supine     Supine to sit: Max assist Sit to supine: Max assist   General bed mobility comments: pt attempting to assist however too weak and required at least max assist for upper and lower body, assisted with repositioning in bed as pt tends to bring trunk to right side  Transfers                    Ambulation/Gait                 Stairs             Wheelchair Mobility    Modified Rankin (Stroke Patients Only)       Balance                                            Cognition Arousal/Alertness: Awake/alert Behavior During Therapy:  WFL for tasks assessed/performed Overall Cognitive Status: No family/caregiver present to determine baseline cognitive functioning                   Orientation Level: Time;Situation;Place;Disoriented to     Following Commands: Follows one step commands consistently       General Comments: aware of need for +2 assist to be OOB, reports his right leg has had pain since his fall in Oct, reports preferring to read the newspaper however also happy to listen to [facility] on tv      Exercises General Exercises - Upper Extremity Shoulder Flexion: AROM;Both;10 reps;Supine General Exercises - Lower Extremity Ankle Circles/Pumps: AROM;Both;10 reps;Supine Heel Slides: AROM;Both;10 reps;Supine Hip ABduction/ADduction: AROM;Both;10 reps;Supine    General Comments        Pertinent Vitals/Pain Pain Assessment: Faces Faces Pain Scale: Hurts even more Pain Location: right leg ("since I fell in Oct") Pain Descriptors / Indicators: Sore Pain Intervention(s): Repositioned;Monitored during session    Home Living  Prior Function            PT Goals (current goals can now be found in the care plan section) Progress towards PT goals: Progressing toward goals    Frequency    Min 2X/week      PT Plan Current plan remains appropriate    Co-evaluation              AM-PAC PT "6 Clicks" Mobility   Outcome Measure  Help needed turning from your back to your side while in a flat bed without using bedrails?: A Lot Help needed moving from lying on your back to sitting on the side of a flat bed without using bedrails?: Total Help needed moving to and from a bed to a chair (including a wheelchair)?: Total Help needed standing up from a chair using your arms (e.g., wheelchair or bedside chair)?: Total Help needed to walk in hospital room?: Total Help needed climbing 3-5 steps with a railing? : Total 6 Click Score: 7    End of Session   Activity  Tolerance: Patient tolerated treatment well Patient left: in bed;with call bell/phone within reach;with bed alarm set Nurse Communication: Mobility status PT Visit Diagnosis: Muscle weakness (generalized) (M62.81);Unsteadiness on feet (R26.81)     Time: 0347-4259 PT Time Calculation (min) (ACUTE ONLY): 21 min  Charges:  $Therapeutic Activity: 8-22 mins                     Jannette Spanner PT, DPT Acute Rehabilitation Services Pager: [phone] Office: [phone]  York Ram E 12/21/2020, 4:03 PM

## 2020-12-21 NOTE — Progress Notes (Signed)
Daily Progress Note   Patient Name: Casey Gates.       Date: 12/21/2020 DOB: 1926-02-28  Age: 87 y.o. MRN#: [id number] Attending Physician: Oswald Hillock, MD Primary Care Physician: Velna Hatchet, MD Admit Date: 12/17/2020  Reason for Consultation/Follow-up: Establishing goals of care  Subjective: Patient more somnolent today. Following minimal commands. SLP at bedside- they are familiar with him and express great worry regarding his decreased po intake. It is visibly apparent that he has continued to lose weight. His current mentation is not conducive to adequate oral intake to support an adequate functional status or to support recovery from illness state.   Review of Systems  Unable to perform ROS: Acuity of condition    Length of Stay: 4  Current Medications: Scheduled Meds:  . apixaban  2.5 mg Oral BID  . artificial tears  1 application Left Eye TID  . azithromycin  500 mg Oral Daily  . Chlorhexidine Gluconate Cloth  6 each Topical Daily  . Chlorhexidine Gluconate Cloth  6 each Topical Q0600  . feeding supplement  237 mL Oral BID BM  . mirtazapine  30 mg Oral QHS  . mupirocin ointment  1 application Nasal BID  . naloxegol oxalate  25 mg Oral Daily    Continuous Infusions: . cefTRIAXone (ROCEPHIN)  IV 1 g (12/21/20 1326)  . dextrose 5 % and 0.45% NaCl 75 mL/hr at 12/21/20 0137  . diltiazem (CARDIZEM) infusion 5 mg/hr (12/21/20 1242)    PRN Meds: acetaminophen **OR** acetaminophen, gabapentin, ipratropium-albuterol, ondansetron **OR** ondansetron (ZOFRAN) IV, Resource ThickenUp Clear  Physical Exam Vitals and nursing note reviewed.  Constitutional:      Comments: Frail, cachectic  Neurological:     Mental Status: He is disoriented.     Comments: somnolent              Vital Signs: BP 95/68 (BP Location: Left Arm)   Pulse 93   Temp 98.6 F (37 C) (Axillary)   Resp 15   Ht 5\' 11"  (1.803 m)   Wt 57.7 kg   SpO2 91%   BMI 17.74 kg/m  SpO2: SpO2: 91 % O2 Device: O2 Device: Room Air O2 Flow Rate:    Intake/output summary:   Intake/Output Summary (Last 24 hours) at 12/21/2020 1422 Last data filed at 12/21/2020 [phone]  Gross per 24 hour  Intake 1751.48 ml  Output -  Net 1751.48 ml   LBM: Last BM Date:  (UTA) Baseline Weight: Weight: 63.5 kg Most recent weight: Weight: 57.7 kg       Palliative Assessment/Data: PPS: 10%      Patient Active Problem List   Diagnosis Date Noted  . Atrial fibrillation with RVR ([place]) 12/17/2020  . Acute metabolic encephalopathy 59/16/3846  . Hypernatremia 12/17/2020  . Coagulation defect ([place]) 09/27/2020  . Back pain, chronic 12/14/2018  . Edema 12/14/2018  . Prostate cancer ([place]) 12/14/2018  . History of amputation of lesser toe ([place]) 11/16/2018  . Chronic ulcer of right foot with fat layer exposed ([place]) 11/05/2018  . Contusion of right hip region 07/24/2018  . Multifocal pneumonia 03/18/2018  . Sepsis ([place]) 03/18/2018  . Malignant gastrointestinal stromal tumor (GIST) of stomach ([place]) 03/12/2018  . Urinary retention 02/24/2018  . Gastrointestinal stromal tumor (GIST) of stomach ([place])   . Restless leg syndrome   . Acute GI bleeding 02/16/2018  . Acute blood loss anemia 02/16/2018  . Chronic low back pain 02/05/2018  . Degeneration of lumbar intervertebral disc 02/05/2018  . Constipation 01/20/2018  . Exertional shortness of breath 12/30/2017  . Rupture of left quadriceps muscle 07/07/2017  . Rupture of left quadriceps tendon 06/26/2017  . Eczema 06/26/2017  . Chest pain 07/29/2015  . OSA on CPAP 07/10/2015  . CPAP use counseling 07/10/2015  . Excessive daytime sleepiness 02/22/2015  . Sleep apnea 02/22/2015  . Snoring 02/22/2015  . Nocturia more than twice per night 02/22/2015  . Syncope  12/31/2012  . Bifascicular bundle branch block 12/31/2012  . Cellulitis and abscess of leg, except foot 12/04/2012  . Chronic pain 12/04/2012  . Kyphosis 12/04/2012  . H/O unilateral nephrectomy 12/04/2012  . H/O prostate cancer 12/04/2012  . S/P TURP 12/04/2012    Palliative Care Assessment & Plan   Patient Profile: 87 y.o. male  with past medical history of atrial fibrillation, GIST tumor of stomach (s/p partial gastrectomy, tx'd with Gleevec- last CT showed no evidence of recurrence), R nephrectomy, L eye blindness, CKD, chronic pain, iron deficient anemia, chronic constipation, depression with decreased appetite, dysphagia, living at assisted living facility [facility]), recent COVID-19 infection at the end of December- admitted on 12/17/2020 with increased confusion.  Workup revealed sepsis d/t pneumonia with loculated pleural effusion and a fib with RVR. Underwent thoracentesis yielding 225 cc fluid. HR improving with cardizem infusion. He remains confused. Has decreased oral intake- SLP evaluation shows chronic dysphagia, weak cough, but no overt signs of aspiration- recommendations for dysphagia management made. Palliative medicine consulted for goals of care.     Assessment/Recommendations/Plan   Continues with poor mentation, poor po intake  If mentation does not improve will need to readdress goals of care- I do worry that with his ongoing weight loss, decreased oral intake, and poor mental status that he is going to have great difficulty recovering from this current illness  Called patient's son for follow-up- he requested to speak later today- has PMT contact information  PMT will be off service until Monday- recommend continued GOC discussion over the weekend by primary team if patient does not improve- otherwise I will follow-up on Monday   Goals of Care and Additional Recommendations:  Limitations on Scope of Treatment: Full Scope Treatment  Code  Status:  DNR  Prognosis:   Hours - Days  Discharge Planning:  To Be Determined  Thank you for allowing the  Palliative Medicine Team to assist in the care of this patient.   Total time: 39 minutes Greater than 50%  of this time was spent counseling and coordinating care related to the above assessment and plan.  Mariana Kaufman, AGNP-C Palliative Medicine   Please contact Palliative Medicine Team phone at [phone] for questions and concerns.

## 2020-12-21 NOTE — Progress Notes (Signed)
  Speech Language Pathology Treatment: Dysphagia  Patient Details Name: Casey Gates. MRN: [id number] DOB: 1926-11-04 Today's Date: 12/21/2020 Time:  -     Assessment / Plan / Recommendation Clinical Impression  Today pt continuing to lean to the right without awareness.  Oral retention of very dried secretions without awareness.  Pt was provided oral care by this SLP and did state 'yes" when asked if he felt better.  Pt did not open eyes most of session, when he did it was only slightly.  With direct question cue, pt will state he wants water but he denies hunger.  RN reports pt received morphine last night and has been drowsy all morning.  Today this pt is not appropriate for instrumental swallow eval.  Note palliative following pt and with h/o dysphagia prior to this admission with prior recommendations for modified diet.  Thus far during this admit, he has continued to demonstrate poor mentation and concern for nutrition adequacy and aspiration risk present.   Although pt attempts to follow directions - eg: cough, seal lips - he is grossly weak and cough was weak/nonproductive.    HPI HPI: 87 year old male with medical history of gastric cancer, afib on anticoagulation, OSA, and chronic pain syndrome, COVID previously.   He presented to the ED due to AMS. CXR in the ED showed RLL opacity. CT chest showed patchy peribronchovascular opacity suspicious for PNA, small to moderate-sized partly loculated R pleural effusion, and significant atelectasis of the RLL, with a smaller area of atelectasis/scarring of the R middle lobe - s/p thoracentesis.  Pt with decreased attention/cognition per son's information shared with MD.  He has h/o dysphagia dating back to 2014- with rec for double swallows, chin tuck due to decreased UES opening. Esophagram completed showed UES spasm, persistent tertiary contractions and hiatal hernia. Swallow eval ordered.      SLP Plan  MBS (consider MBS on 12/21/2020- pt not  adequately alert for mbs or po)       Recommendations  Diet recommendations: Dysphagia 3 (mechanical soft);Nectar-thick liquid Liquids provided via: Cup;Teaspoon Medication Administration: Crushed with puree Compensations: Slow rate;Small sips/bites Postural Changes and/or Swallow Maneuvers: Out of bed for meals;Seated upright 90 degrees                Oral Care Recommendations: Oral care QID (oral suction after meals) SLP Visit Diagnosis: Dysphagia, oropharyngeal phase (R13.12);Dysphagia, pharyngoesophageal phase (R13.14) Plan: MBS (consider MBS on 12/21/2020- pt not adequately alert for mbs or po)       GO                [name] 12/21/2020, 1:19 PM  [name], MS [place] Office [phone] Pager [phone]

## 2020-12-21 NOTE — Care Management Important Message (Signed)
Important Message  Patient Details IM Letter placed in Patient's room. Name: Casey Gates. MRN: [id number] Date of Birth: 1926-10-18   Medicare Important Message Given:  Yes     Kerin Salen 12/21/2020, 9:54 AM

## 2020-12-22 DIAGNOSIS — E870 Hyperosmolality and hypernatremia: Secondary | ICD-10-CM | POA: Diagnosis not present

## 2020-12-22 DIAGNOSIS — J189 Pneumonia, unspecified organism: Secondary | ICD-10-CM | POA: Diagnosis not present

## 2020-12-22 DIAGNOSIS — G9341 Metabolic encephalopathy: Secondary | ICD-10-CM | POA: Diagnosis not present

## 2020-12-22 DIAGNOSIS — I4891 Unspecified atrial fibrillation: Secondary | ICD-10-CM | POA: Diagnosis not present

## 2020-12-22 LAB — CULTURE, BLOOD (ROUTINE X 2)
Culture: NO GROWTH
Culture: NO GROWTH
Special Requests: ADEQUATE

## 2020-12-22 LAB — BODY FLUID CULTURE
Culture: NO GROWTH
Gram Stain: NONE SEEN

## 2020-12-22 LAB — CBC
HCT: 37.7 % — ABNORMAL LOW (ref 39.0–52.0)
Hemoglobin: 11.6 g/dL — ABNORMAL LOW (ref 13.0–17.0)
MCH: 28.5 pg (ref 26.0–34.0)
MCHC: 30.8 g/dL (ref 30.0–36.0)
MCV: 92.6 fL (ref 80.0–100.0)
Platelets: 333 10*3/uL (ref 150–400)
RBC: 4.07 MIL/uL — ABNORMAL LOW (ref 4.22–5.81)
RDW: 14.6 % (ref 11.5–15.5)
WBC: 21.2 10*3/uL — ABNORMAL HIGH (ref 4.0–10.5)
nRBC: 0 % (ref 0.0–0.2)

## 2020-12-22 LAB — GLUCOSE, CAPILLARY: Glucose-Capillary: 124 mg/dL — ABNORMAL HIGH (ref 70–99)

## 2020-12-22 NOTE — Progress Notes (Signed)
Occupational Therapy Treatment Patient Details Name: Casey Gates. MRN: [id number] DOB: 1926/07/02 Today's Date: 12/22/2020    History of present illness Patient is a 87 year old male PMH of A. fib, CKD, chronic pain, OSA (does not use BiPAP) who presented to the hospital 12/17/2020 by son for altered mental status. Per chart had COVID end of Dec 2021   OT comments  Patient drowsy during treatment. Patient max assist to transfer to side of bed to attempt grooming task. Patient refuses participation in washing face so therapist did it for him. Patient exhibiting weak cough. Patient encouraged to cough hard and therapist patted his back. Patient reports pain in back and wanting to lay down and sleep. Patient returned to supine. Patient coughing and did exhibit some expectoration and therapist used yonker to remove secretions. Patient able to state he had more in his mouth and asked therapist to perform again. Therapist cleaned mouth with a sponge. Patient exhibits very poor activity tolerance today due to lethargy, confusion and back pain. Will need SNF at discharge.   Follow Up Recommendations  SNF    Equipment Recommendations  None recommended by OT    Recommendations for Other Services      Precautions / Restrictions Precautions Precautions: Fall Precaution Comments: mits, aspiration Restrictions Weight Bearing Restrictions: No       Mobility Bed Mobility   Bed Mobility: Supine to Sit;Sit to Supine     Supine to sit: Max assist;HOB elevated Sit to supine: Max assist   General bed mobility comments: Max assist to transfer to side of bed needing assistance for LEs and trunk negotiation. EOB tolerance poor with patient complaining of back pain.  Transfers                      Balance Overall balance assessment: Needs assistance Sitting-balance support: No upper extremity supported Sitting balance-Leahy Scale: Fair Sitting balance - Comments: min guard at side  of bed                                   ADL either performed or assessed with clinical judgement   ADL Overall ADL's : Needs assistance/impaired     Grooming: Set up;Sitting;Total assistance Grooming Details (indicate cue type and reason): Patient seated at side of bed. Patient required assist to wash face- wouldn't attempt. Patient focused on returning to supine.                               General ADL Comments: Tranferred to edge of bed to promote seated grooming task. Patient participatory. Patient encouraged to cough while therapist patted back to improve expectoration. Limited tolerance in sitting due to back pain.     Vision   Vision Assessment?: No apparent visual deficits   Perception     Praxis      Cognition Arousal/Alertness: Lethargic Behavior During Therapy: WFL for tasks assessed/performed Overall Cognitive Status: No family/caregiver present to determine baseline cognitive functioning                                 General Comments: Patient drowsy and most of the time speech difficulty to understand. At end of treatment patient asked what day it was and the time of the day.  Exercises     Shoulder Instructions       General Comments      Pertinent Vitals/ Pain       Pain Assessment: Faces Faces Pain Scale: Hurts even more Pain Location: back Pain Descriptors / Indicators: Grimacing;Moaning;Guarding Pain Intervention(s): Limited activity within patient's tolerance  Home Living                                          Prior Functioning/Environment              Frequency  Min 2X/week        Progress Toward Goals  OT Goals(current goals can now be found in the care plan section)  Progress towards OT goals: Not progressing toward goals - comment (confusion, pain and poor tolerance limiting participation.)  Acute Rehab OT Goals Patient Stated Goal: did not state OT  Goal Formulation: Patient unable to participate in goal setting Time For Goal Achievement: 01/02/21 Potential to Achieve Goals: [place] Discharge plan remains appropriate    Co-evaluation          OT goals addressed during session: ADL's and self-care      AM-PAC OT "6 Clicks" Daily Activity     Outcome Measure   Help from another person eating meals?: A Lot Help from another person taking care of personal grooming?: A Lot Help from another person toileting, which includes using toliet, bedpan, or urinal?: Total Help from another person bathing (including washing, rinsing, drying)?: Total Help from another person to put on and taking off regular upper body clothing?: Total Help from another person to put on and taking off regular lower body clothing?: Total 6 Click Score: 8    End of Session    OT Visit Diagnosis: Other abnormalities of gait and mobility (R26.89);Muscle weakness (generalized) (M62.81);Other symptoms and signs involving cognitive function   Activity Tolerance Patient limited by fatigue;Patient limited by pain   Patient Left in bed;with call bell/phone within reach;with bed alarm set   Nurse Communication Mobility status        Time: 3734-2876 OT Time Calculation (min): 14 min  Charges: OT General Charges $OT Visit: 1 Visit OT Treatments $Self Care/Home Management : 8-22 mins  Derl Barrow, OTR/L [place]  Office [phone] Pager: [place] 12/22/2020, 1:18 PM

## 2020-12-22 NOTE — NC FL2 (Signed)
[place] LEVEL OF CARE SCREENING TOOL     IDENTIFICATION  Patient Name: Casey Gates. Birthdate: 12-23-1925 Sex: male Admission Date (Current Location): 12/17/2020  [facility] and [place] Number:  Herbalist and Address:  [facility],  [place], [place]      Provider Number: [id number]  Attending Physician Name and Address:  Oswald Hillock, MD  Relative Name and Phone Number:  Tarvares, Lant   [phone]    Current Level of Care: Hospital Recommended Level of Care: [place] Prior Approval Number:    Date Approved/Denied:   PASRR Number: [id number]  Discharge Plan: Home    Current Diagnoses: Patient Active Problem List   Diagnosis Date Noted  . Atrial fibrillation with RVR ([place]) 12/17/2020  . Acute metabolic encephalopathy 02/63/7858  . Hypernatremia 12/17/2020  . Coagulation defect ([place]) 09/27/2020  . Back pain, chronic 12/14/2018  . Edema 12/14/2018  . Prostate cancer ([place]) 12/14/2018  . History of amputation of lesser toe ([place]) 11/16/2018  . Chronic ulcer of right foot with fat layer exposed ([place]) 11/05/2018  . Contusion of right hip region 07/24/2018  . Multifocal pneumonia 03/18/2018  . Sepsis ([place]) 03/18/2018  . Malignant gastrointestinal stromal tumor (GIST) of stomach ([place]) 03/12/2018  . Urinary retention 02/24/2018  . Gastrointestinal stromal tumor (GIST) of stomach ([place])   . Restless leg syndrome   . Acute GI bleeding 02/16/2018  . Acute blood loss anemia 02/16/2018  . Chronic low back pain 02/05/2018  . Degeneration of lumbar intervertebral disc 02/05/2018  . Constipation 01/20/2018  . Exertional shortness of breath 12/30/2017  . Rupture of left quadriceps muscle 07/07/2017  . Rupture of left quadriceps tendon 06/26/2017  . Eczema 06/26/2017  . Chest pain 07/29/2015  . OSA on CPAP 07/10/2015  . CPAP use counseling 07/10/2015  . Excessive daytime sleepiness 02/22/2015  .  Sleep apnea 02/22/2015  . Snoring 02/22/2015  . Nocturia more than twice per night 02/22/2015  . Syncope 12/31/2012  . Bifascicular bundle branch block 12/31/2012  . Cellulitis and abscess of leg, except foot 12/04/2012  . Chronic pain 12/04/2012  . Kyphosis 12/04/2012  . H/O unilateral nephrectomy 12/04/2012  . H/O prostate cancer 12/04/2012  . S/P TURP 12/04/2012    Orientation RESPIRATION BLADDER Height & Weight     Self,Place  O2 (1L) Incontinent Weight: 127 lb 3.3 oz (57.7 kg) Height:  5\' 11"  (180.3 cm)  BEHAVIORAL SYMPTOMS/MOOD NEUROLOGICAL BOWEL NUTRITION STATUS      Continent Diet (Dysphagia 3)  AMBULATORY STATUS COMMUNICATION OF NEEDS Skin   Limited Assist Verbally Surgical wounds                       Personal Care Assistance Level of Assistance  Bathing,Feeding,Dressing Bathing Assistance: Limited assistance Feeding assistance: Limited assistance Dressing Assistance: Limited assistance     Functional Limitations Info  Sight,Hearing,Speech Sight Info: Adequate Hearing Info: Adequate Speech Info: Adequate    SPECIAL CARE FACTORS FREQUENCY  PT (By licensed PT),OT (By licensed OT)     PT Frequency: Minimum 5x a week OT Frequency: Minimum 5x a week            Contractures Contractures Info: Not present    Additional Factors Info  Code Status,Allergies,Psychotropic,Isolation Precautions Code Status Info: DNR Allergies Info: Psychiatrist, Coconut Oil   Doxycycline Hyclate   Food   Black Gaffer   Doxycycline Psychotropic Info:  mirtazapine (REMERON) tablet 30 mg   Isolation Precautions Info: MRSA     Current Medications (12/22/2020):  This is the current hospital active medication list Current Facility-Administered Medications  Medication Dose Route Frequency Provider Last Rate Last Admin  . acetaminophen (TYLENOL) tablet 650 mg  650 mg Oral Q6H PRN Oswald Hillock, MD   650 mg at 12/22/20 0039   Or  . acetaminophen (TYLENOL)  suppository 650 mg  650 mg Rectal Q6H PRN Oswald Hillock, MD      . apixaban Arne Cleveland) tablet 2.5 mg  2.5 mg Oral BID Oswald Hillock, MD   2.5 mg at 12/21/20 2213  . artificial tears (LACRILUBE) ophthalmic ointment 1 application  1 application Left Eye TID Oswald Hillock, MD   1 application at 02/54/27 2220  . cefTRIAXone (ROCEPHIN) 1 g in sodium chloride 0.9 % 100 mL IVPB  1 g Intravenous Q24H Oswald Hillock, MD 200 mL/hr at 12/22/20 1359 1 g at 12/22/20 1359  . Chlorhexidine Gluconate Cloth 2 % PADS 6 each  6 each Topical Daily Oswald Hillock, MD   6 each at 12/22/20 1029  . Chlorhexidine Gluconate Cloth 2 % PADS 6 each  6 each Topical Q0600 Oswald Hillock, MD   6 each at 12/21/20 0602  . dextrose 5 %-0.45 % sodium chloride infusion   Intravenous Continuous Oswald Hillock, MD 75 mL/hr at 12/22/20 1455 New Bag at 12/22/20 1455  . diltiazem (CARDIZEM) 125 mg in dextrose 5% 125 mL (1 mg/mL) infusion  5-15 mg/hr Intravenous Continuous Oswald Hillock, MD 10 mL/hr at 12/22/20 1457 10 mg/hr at 12/22/20 1457  . feeding supplement (ENSURE ENLIVE / ENSURE PLUS) liquid 237 mL  237 mL Oral BID BM Oswald Hillock, MD   237 mL at 12/21/20 1659  . gabapentin (NEURONTIN) capsule 300 mg  300 mg Oral TID PRN Oswald Hillock, MD   300 mg at 12/22/20 0039  . ipratropium-albuterol (DUONEB) 0.5-2.5 (3) MG/3ML nebulizer solution 3 mL  3 mL Nebulization Q4H PRN Oswald Hillock, MD      . mirtazapine (REMERON) tablet 30 mg  30 mg Oral QHS Oswald Hillock, MD   30 mg at 12/21/20 2213  . mupirocin ointment (BACTROBAN) 2 % 1 application  1 application Nasal BID Oswald Hillock, MD   1 application at 05/25/75 1030  . naloxegol oxalate (MOVANTIK) tablet 25 mg  25 mg Oral Daily Oswald Hillock, MD   25 mg at 12/21/20 1213  . ondansetron (ZOFRAN) tablet 4 mg  4 mg Oral Q6H PRN Oswald Hillock, MD       Or  . ondansetron (ZOFRAN) injection 4 mg  4 mg Intravenous Q6H PRN Oswald Hillock, MD      . Resource ThickenUp Clear   Oral PRN Oswald Hillock, MD          Discharge Medications: Please see discharge summary for a list of discharge medications.  Relevant Imaging Results:  Relevant Lab Results:   Additional Information SSN [id number]  Ross Ludwig, LCSW

## 2020-12-22 NOTE — NC FL2 (Deleted)
[place] LEVEL OF CARE SCREENING TOOL     IDENTIFICATION  Patient Name: Casey Gates. Birthdate: 02/09/1926 Sex: male Admission Date (Current Location): 12/17/2020  [facility] and [place] Number:  Herbalist and Address:         Provider Number: [id number]  Attending Physician Name and Address:  Oswald Hillock, MD  Relative Name and Phone Number:  [place] Son   [phone]    Current Level of Care: Hospital Recommended Level of Care: [place] Prior Approval Number:    Date Approved/Denied:   PASRR Number: [id number]  Discharge Plan: Home    Current Diagnoses: Patient Active Problem List   Diagnosis Date Noted   Atrial fibrillation with RVR ([place]) 08/65/7846   Acute metabolic encephalopathy 96/29/5284   Hypernatremia 12/17/2020   Coagulation defect ([place]) 09/27/2020   Back pain, chronic 12/14/2018   Edema 12/14/2018   Prostate cancer ([place]) 12/14/2018   History of amputation of lesser toe ([place]) 11/16/2018   Chronic ulcer of right foot with fat layer exposed ([place]) 11/05/2018   Contusion of right hip region 07/24/2018   Multifocal pneumonia 03/18/2018   Sepsis ([place]) 03/18/2018   Malignant gastrointestinal stromal tumor (GIST) of stomach ([place]) 03/12/2018   Urinary retention 02/24/2018   Gastrointestinal stromal tumor (GIST) of stomach (HCC)    Restless leg syndrome    Acute GI bleeding 02/16/2018   Acute blood loss anemia 02/16/2018   Chronic low back pain 02/05/2018   Degeneration of lumbar intervertebral disc 02/05/2018   Constipation 01/20/2018   Exertional shortness of breath 12/30/2017   Rupture of left quadriceps muscle 07/07/2017   Rupture of left quadriceps tendon 06/26/2017   Eczema 06/26/2017   Chest pain 07/29/2015   OSA on CPAP 07/10/2015   CPAP use counseling 07/10/2015   Excessive daytime sleepiness 02/22/2015   Sleep apnea 02/22/2015   Snoring 02/22/2015   Nocturia  more than twice per night 02/22/2015   Syncope 12/31/2012   Bifascicular bundle branch block 12/31/2012   Cellulitis and abscess of leg, except foot 12/04/2012   Chronic pain 12/04/2012   Kyphosis 12/04/2012   H/O unilateral nephrectomy 12/04/2012   H/O prostate cancer 12/04/2012   S/P TURP 12/04/2012    Orientation RESPIRATION BLADDER Height & Weight     Self,Place  O2 (1L) Incontinent Weight: 127 lb 3.3 oz (57.7 kg) Height:  5\' 11"  (180.3 cm)  BEHAVIORAL SYMPTOMS/MOOD NEUROLOGICAL BOWEL NUTRITION STATUS      Continent Diet (Dysphagia 3)  AMBULATORY STATUS COMMUNICATION OF NEEDS Skin   Limited Assist Verbally Surgical wounds                       Personal Care Assistance Level of Assistance  Bathing,Feeding,Dressing Bathing Assistance: Limited assistance Feeding assistance: Limited assistance Dressing Assistance: Limited assistance     Functional Limitations Info  Sight,Hearing,Speech Sight Info: Adequate Hearing Info: Adequate Speech Info: Adequate    SPECIAL CARE FACTORS FREQUENCY  PT (By licensed PT),OT (By licensed OT)     PT Frequency: Minimum 5x a week OT Frequency: Minimum 5x a week            Contractures Contractures Info: Not present    Additional Factors Info  Code Status,Allergies,Psychotropic,Isolation Precautions Code Status Info: DNR Allergies Info: Psychiatrist, Coconut Oil   Doxycycline Hyclate   Food   Black Gaffer   Doxycycline Psychotropic Info: mirtazapine (REMERON) tablet 30 mg   Isolation  Precautions Info: MRSA     Current Medications (12/22/2020):  This is the current hospital active medication list Current Facility-Administered Medications  Medication Dose Route Frequency Provider Last Rate Last Admin   acetaminophen (TYLENOL) tablet 650 mg  650 mg Oral Q6H PRN Oswald Hillock, MD   650 mg at 12/22/20 5638   Or   acetaminophen (TYLENOL) suppository 650 mg  650 mg Rectal Q6H PRN Oswald Hillock, MD        apixaban Arne Cleveland) tablet 2.5 mg  2.5 mg Oral BID Oswald Hillock, MD   2.5 mg at 12/21/20 2213   artificial tears (LACRILUBE) ophthalmic ointment 1 application  1 application Left Eye TID Oswald Hillock, MD   1 application at 75/64/33 2220   cefTRIAXone (ROCEPHIN) 1 g in sodium chloride 0.9 % 100 mL IVPB  1 g Intravenous Q24H Oswald Hillock, MD 200 mL/hr at 12/22/20 1359 1 g at 12/22/20 1359   Chlorhexidine Gluconate Cloth 2 % PADS 6 each  6 each Topical Daily Oswald Hillock, MD   6 each at 12/22/20 1029   Chlorhexidine Gluconate Cloth 2 % PADS 6 each  6 each Topical Q0600 Oswald Hillock, MD   6 each at 12/21/20 0602   dextrose 5 %-0.45 % sodium chloride infusion   Intravenous Continuous Oswald Hillock, MD 75 mL/hr at 12/22/20 1455 New Bag at 12/22/20 1455   diltiazem (CARDIZEM) 125 mg in dextrose 5% 125 mL (1 mg/mL) infusion  5-15 mg/hr Intravenous Continuous Oswald Hillock, MD 10 mL/hr at 12/22/20 1457 10 mg/hr at 12/22/20 1457   feeding supplement (ENSURE ENLIVE / ENSURE PLUS) liquid 237 mL  237 mL Oral BID BM Oswald Hillock, MD   237 mL at 12/21/20 1659   gabapentin (NEURONTIN) capsule 300 mg  300 mg Oral TID PRN Oswald Hillock, MD   300 mg at 12/22/20 0039   ipratropium-albuterol (DUONEB) 0.5-2.5 (3) MG/3ML nebulizer solution 3 mL  3 mL Nebulization Q4H PRN Oswald Hillock, MD       mirtazapine (REMERON) tablet 30 mg  30 mg Oral QHS Oswald Hillock, MD   30 mg at 12/21/20 2213   mupirocin ointment (BACTROBAN) 2 % 1 application  1 application Nasal BID Oswald Hillock, MD   1 application at 29/51/88 1030   naloxegol oxalate (MOVANTIK) tablet 25 mg  25 mg Oral Daily Oswald Hillock, MD   25 mg at 12/21/20 1213   ondansetron (ZOFRAN) tablet 4 mg  4 mg Oral Q6H PRN Oswald Hillock, MD       Or   ondansetron (ZOFRAN) injection 4 mg  4 mg Intravenous Q6H PRN Oswald Hillock, MD       Resource ThickenUp Clear   Oral PRN Oswald Hillock, MD         Discharge Medications: Please see discharge  summary for a list of discharge medications.  Relevant Imaging Results:  Relevant Lab Results:   Additional Information SSN [id number]  Ross Ludwig, LCSW

## 2020-12-22 NOTE — Progress Notes (Signed)
[facility]  PROGRESS NOTE  Casey Gates. Casey Gates DOB: 02/03/1926 DOA: 12/17/2020 PCP: Velna Hatchet, MD   Brief HPI:   87 year old male with history of atrial fibrillation on anticoagulation, obstructive sleep apnea, chronic pain syndrome presented to ED for altered mental status. He was found to have right lower lung opacity on chest x-ray. CT of the chest with contrast showed patchy peribronchovascular opacity in the left lower lobe suspicious for pneumonia. Small to moderate size partly loculated right pleural effusion. He was started on IV Rocephin and Zithromax, IR was consulted for thoracentesis. Tumor team mL of fluid was removed. He was found to have loculated pleural effusion. As per lights criteria it was a grade of effusion. Patient also developed A. fib with RVR, started on Cardizem gtt. at 15 mg/h.    Subjective   Patient seen and examined, continues to be pleasantly confused.     Assessment/Plan:     1. Sepsis secondary to left lower lobe pneumonia-patient presented with sepsis with leukocytosis, tachycardia. CT chest showed left lower lobe pneumonia. He was started on IV Rocephin and IV Zithromax. Continue supplemental oxygen via nasal cannula.  WBC is 21.2 today. 2. Atrial fibrillation with RVR-rate controlled with Cardizem gtt. and Eliquis for anticoagulation. Cardiology following. Plan to transition back to home diltiazem dose of 120 mg p.o. twice daily once able to take p.o. 3. Hypernatremia-resolved, today sodium is 123456. 4. Metabolic encephalopathy-slowly improving, likely from infectious etiology from pneumonia as above. Patient also was on morphine 30 mg every 6 hours as needed. He received morphine last night. Will discontinue morphine at this time. As per patient's son patient takes oxymorphone 10 mg 4 times a day as needed for pain. 5. Right moderate loculated pleural effusion-ultrasound-guided thoracentesis ordered which was removed to 25 cc of  hazy amber fluid. LDH 1318, appears to be exudative. 6. Obstructive sleep apnea-continue CPAP 7. Goals of care discussion-palliative care was consulted, patient is DNR, continue full scope of care.     COVID-19 Labs  No results for input(s): DDIMER, FERRITIN, LDH, CRP in the last 72 hours.  Lab Results  Component Value Date   [place] NEGATIVE 12/17/2020   [place] NEGATIVE 07/27/2020     Scheduled medications:   . apixaban  2.5 mg Oral BID  . artificial tears  1 application Left Eye TID  . azithromycin  500 mg Oral Daily  . Chlorhexidine Gluconate Cloth  6 each Topical Daily  . Chlorhexidine Gluconate Cloth  6 each Topical Q0600  . feeding supplement  237 mL Oral BID BM  . mirtazapine  30 mg Oral QHS  . mupirocin ointment  1 application Nasal BID  . naloxegol oxalate  25 mg Oral Daily         CBG: Recent Labs  Lab 12/20/20 2143 12/21/20 0039 12/21/20 0510  GLUCAP 104* 103* 89    SpO2: (!) 88 %    CBC: Recent Labs  Lab 12/17/20 1138 12/18/20 0425 12/19/20 0400 12/20/20 0259 12/22/20 0528  WBC 22.9* 22.1* 20.8* 18.9* 21.2*  NEUTROABS 20.1*  --  17.7* 15.4*  --   HGB 13.1 12.7* 12.5* 12.6* 11.6*  HCT 42.6 40.6 39.4 41.7 37.7*  MCV 92.4 91.9 90.8 93.1 92.6  PLT 262 294 358 369 0000000    Basic Metabolic Panel: Recent Labs  Lab 12/17/20 1138 12/18/20 0425 12/19/20 0400 12/20/20 0259  NA 147* 149* 147* 144  K 4.0 3.8 3.3* 3.5  CL 108 111 109 109  CO2  33* 26 28 25   GLUCOSE 145* 96 131* 118*  BUN 43* 32* 20 17  CREATININE 1.13 0.84 0.79 0.83  CALCIUM 9.5 8.7* 8.3* 8.2*  MG  --  2.2  --   --      Liver Function Tests: Recent Labs  Lab 12/17/20 1138 12/18/20 0425 12/20/20 0259  AST 54* 52* 45*  ALT 33 34 36  ALKPHOS 81 82 76  BILITOT 0.8 0.7 0.7  PROT 7.0 6.0* 5.6*  ALBUMIN 3.1* 2.6* 2.5*     Antibiotics: Anti-infectives (From admission, onward)   Start     Dose/Rate Route Frequency Ordered Stop   12/21/20 1400  azithromycin  (ZITHROMAX) tablet 500 mg        500 mg Oral Daily 12/21/20 1150     12/19/20 1100  cefTRIAXone (ROCEPHIN) 1 g in sodium chloride 0.9 % 100 mL IVPB  Status:  Discontinued        1 g 200 mL/hr over 30 Minutes Intravenous Every 24 hours 12/18/20 1106 12/18/20 1108   12/18/20 1500  azithromycin (ZITHROMAX) 500 mg in sodium chloride 0.9 % 250 mL IVPB  Status:  Discontinued        500 mg 250 mL/hr over 60 Minutes Intravenous Every 24 hours 12/18/20 1106 12/21/20 1150   12/18/20 1200  cefTRIAXone (ROCEPHIN) 1 g in sodium chloride 0.9 % 100 mL IVPB        1 g 200 mL/hr over 30 Minutes Intravenous Every 24 hours 12/18/20 1108     12/17/20 1445  azithromycin (ZITHROMAX) 500 mg in sodium chloride 0.9 % 250 mL IVPB        500 mg 250 mL/hr over 60 Minutes Intravenous  Once 12/17/20 1430 12/17/20 1840   12/17/20 1030  cefTRIAXone (ROCEPHIN) 1 g in sodium chloride 0.9 % 100 mL IVPB        1 g 200 mL/hr over 30 Minutes Intravenous  Once 12/17/20 1020 12/17/20 1305       DVT prophylaxis: Eliquis  Code Status: DNR  Family Communication: No family at bedside   Consultants:  Palliative care  Cardiology  Procedures:  Thoracentesis    Objective   Vitals:   12/21/20 0954 12/21/20 1456 12/21/20 2215 12/22/20 0644  BP: 95/68 91/65 107/69 100/63  Pulse: 93 65 (!) 108 95  Resp:  20 20 20   Temp: 98.6 F (37 C) 98 F (36.7 C) 98.3 F (36.8 C) (!) 97.2 F (36.2 C)  TempSrc: Axillary Axillary  Axillary  SpO2: 91% 91% (!) 85% (!) 88%  Weight:      Height:        Intake/Output Summary (Last 24 hours) at 12/22/2020 1204 Last data filed at 12/22/2020 0355 Gross per 24 hour  Intake 2084.38 ml  Output 200 ml  Net 1884.38 ml    01/19 1901 - 01/21 0700 In: 3507.5 [P.O.:480; I.V.:2133.3] Out: 350 [Urine:350]  Filed Weights   12/17/20 2323 12/19/20 0635  Weight: 63.5 kg 57.7 kg    Physical Examination:   General-appears in no acute distress Heart-S1-S2, regular, no murmur  auscultated Lungs-clear to auscultation bilaterally, no wheezing or crackles auscultated Abdomen-soft, nontender, no organomegaly Extremities-no edema in the lower extremities Neuro-alert, oriented x2, pleasantly confused  Status is: Inpatient  Dispo: The patient is from: Skilled nursing facility              Anticipated d/c is to: Skilled nursing facility              Anticipated  d/c date is: 12/24/2020              Patient currently not medically stable for discharge  Barrier to discharge-       Data Reviewed:   Recent Results (from the past 240 hour(s))  Culture, blood (routine x 2)     Status: None   Collection Time: 12/17/20 10:20 AM   Specimen: BLOOD  Result Value Ref Range Status   Specimen Description   Final    BLOOD BLOOD RIGHT FOREARM Performed at [facility] [address], [place], [place] [address]    Special Requests   Final    BOTTLES DRAWN AEROBIC AND ANAEROBIC Blood Culture adequate volume Performed at [facility] [address] [place], [place] [address]    Culture   Final    NO GROWTH 5 DAYS Performed at [facility], [place] [address] [place], [place] [address]    Report Status 12/22/2020 FINAL  Final  Culture, blood (routine x 2)     Status: None   Collection Time: 12/17/20 10:25 AM   Specimen: BLOOD  Result Value Ref Range Status   Specimen Description   Final    BLOOD BLOOD LEFT FOREARM Performed at [facility] [address] [place], [place] [address]    Special Requests   Final    BOTTLES DRAWN AEROBIC AND ANAEROBIC Blood Culture results may not be optimal due to an inadequate volume of blood received in culture bottles Performed at [facility] [address], [place], [place] [address]    Culture   Final    NO GROWTH 5 DAYS Performed at [facility], [place] [address], [place], [place] [address]    Report Status 12/22/2020 FINAL  Final  SARS  CORONAVIRUS 2 (TAT 6-24 HRS) Nasopharyngeal Nasopharyngeal Swab     Status: None   Collection Time: 12/17/20  3:02 PM   Specimen: Nasopharyngeal Swab  Result Value Ref Range Status   SARS Coronavirus 2 NEGATIVE NEGATIVE Final    Comment: (NOTE) SARS-CoV-2 target nucleic acids are NOT DETECTED.  The SARS-CoV-2 RNA is generally detectable in upper and lower respiratory specimens during the acute phase of infection. Negative results do not preclude SARS-CoV-2 infection, do not rule out co-infections with other pathogens, and should not be used as the sole basis for treatment or other patient management decisions. Negative results must be combined with clinical observations, patient history, and epidemiological information. The expected result is Negative.  Fact Sheet for Patients: SugarRoll.be  Fact Sheet for Healthcare Providers: https://www.woods-mathews.com/  This test is not yet approved or cleared by the [place] FDA and  has been authorized for detection and/or diagnosis of SARS-CoV-2 by FDA under an Emergency Use Authorization (EUA). This EUA will remain  in effect (meaning this test can be used) for the duration of the COVID-19 declaration under Se ction 564(b)(1) of the Act, 21 U.S.C. section 360bbb-3(b)(1), unless the authorization is terminated or revoked sooner.  Performed at [facility], [place] [address], [place], [place] [address]   Body fluid culture     Status: None   Collection Time: 12/18/20  1:27 PM   Specimen: PATH Cytology Pleural fluid  Result Value Ref Range Status   Specimen Description   Final    PLEURAL Performed at [facility] [address], [place], [place] [address]    Special Requests   Final    NONE  Performed at [facility], [place] [address] [place], [place] [address]    Gram Stain NO WBC SEEN NO ORGANISMS SEEN   Final   Culture   Final    NO GROWTH  3 DAYS Performed at [facility], [place] [address], [place], [place] [address]    Report Status 12/22/2020 FINAL  Final  MRSA PCR Screening     Status: Abnormal   Collection Time: 12/19/20  6:23 AM   Specimen: Nasopharyngeal  Result Value Ref Range Status   MRSA by PCR POSITIVE (A) NEGATIVE Final    Comment:        The GeneXpert MRSA Assay (FDA approved for NASAL specimens only), is one component of a comprehensive MRSA colonization surveillance program. It is not intended to diagnose MRSA infection nor to guide or monitor treatment for MRSA infections. RESULT CALLED TO, READ BACK BY AND VERIFIED WITH: CODLE,L. RN @1315  ON 01.18.2022 BY COHEN,K Performed at [facility], [place] [address], [place], [place] [address]        Oswald Hillock   [facility] If 7PM-7AM, please contact night-coverage at [email or web address], Office  [phone]   12/22/2020, 12:04 PM  LOS: 5 days

## 2020-12-22 NOTE — TOC Progression Note (Addendum)
Transition of Care (TOC) - Progression Note    Patient Details  Name: Casey Gates. MRN: [id number] Date of Birth: 04/20/1926  Transition of [facility]) CM/SW Contact  Cecil Cobbs Phone Number: 12/22/2020, 4:46 PM  Clinical Narrative:     CSW spoke to [place] the nurse at [facility] at [employer].  Per nurse patient normally transfers himself, dresses himself, uses the walker to ambulate, and uses the bathroom without assistance.  Per nurse she states that sometimes patient will sit in his wheelchair and wheel himself around the facility.  Patient is in the ALF side of [facility].    CSW spoke with patient's insurance company he is managed by [employer] and will need insurance authorization prior to going to a SNF.  CSW spoke to patient's son, he is in agreement for patient to go to SNF for short term rehab, and gave CSW permission to begin bed search in [place].  CSW faxed patient's information to different facilities awaiting bed offers.   Expected Discharge Plan: Home/Self Care Barriers to Discharge: Continued Medical Work up  Expected Discharge Plan and Services Expected Discharge Plan: Home/Self Care   Discharge Planning Services: CM Consult   Living arrangements for the past 2 months: Apartment                                       Social Determinants of Health (SDOH) Interventions    Readmission Risk Interventions No flowsheet data found.

## 2020-12-23 DIAGNOSIS — E870 Hyperosmolality and hypernatremia: Secondary | ICD-10-CM | POA: Diagnosis not present

## 2020-12-23 DIAGNOSIS — I4891 Unspecified atrial fibrillation: Secondary | ICD-10-CM | POA: Diagnosis not present

## 2020-12-23 DIAGNOSIS — J189 Pneumonia, unspecified organism: Secondary | ICD-10-CM | POA: Diagnosis not present

## 2020-12-23 DIAGNOSIS — G9341 Metabolic encephalopathy: Secondary | ICD-10-CM | POA: Diagnosis not present

## 2020-12-23 LAB — CBC
HCT: 39.9 % (ref 39.0–52.0)
Hemoglobin: 12.3 g/dL — ABNORMAL LOW (ref 13.0–17.0)
MCH: 28 pg (ref 26.0–34.0)
MCHC: 30.8 g/dL (ref 30.0–36.0)
MCV: 90.7 fL (ref 80.0–100.0)
Platelets: 343 10*3/uL (ref 150–400)
RBC: 4.4 MIL/uL (ref 4.22–5.81)
RDW: 14.6 % (ref 11.5–15.5)
WBC: 16.4 10*3/uL — ABNORMAL HIGH (ref 4.0–10.5)
nRBC: 0 % (ref 0.0–0.2)

## 2020-12-23 MED ORDER — METOPROLOL TARTRATE 5 MG/5ML IV SOLN
5.0000 mg | Freq: Once | INTRAVENOUS | Status: AC
  Administered 2020-12-23: 5 mg via INTRAVENOUS
  Filled 2020-12-23: qty 5

## 2020-12-23 MED ORDER — DILTIAZEM HCL 60 MG PO TABS
60.0000 mg | ORAL_TABLET | Freq: Four times a day (QID) | ORAL | Status: DC
  Filled 2020-12-23 (×2): qty 1

## 2020-12-23 MED ORDER — DILTIAZEM HCL 30 MG PO TABS
30.0000 mg | ORAL_TABLET | Freq: Four times a day (QID) | ORAL | Status: DC
  Administered 2020-12-23 – 2020-12-24 (×4): 30 mg via ORAL
  Filled 2020-12-23 (×3): qty 1

## 2020-12-23 NOTE — Progress Notes (Signed)
Night provider [place] notified red mews. Cardizem titrated up per protocol from 10 to 15 to maintain HR. HR improved from upper 120s to  110. Nasal cannula 2 L added to help RR in 30s and pulse ox 85-89% improved to 92%. No change in RR. HOB 30 degrees. NAD. Dentist at bedside. Will continue to monitor.

## 2020-12-23 NOTE — Progress Notes (Signed)
[facility]  PROGRESS NOTE  [name]. WUJ:[id number] DOB: Feb 06, 1926 DOA: 12/17/2020 PCP: Velna Hatchet, MD   Brief HPI:   87 year old male with history of atrial fibrillation on anticoagulation, obstructive sleep apnea, chronic pain syndrome presented to ED for altered mental status. He was found to have right lower lung opacity on chest x-ray. CT of the chest with contrast showed patchy peribronchovascular opacity in the left lower lobe suspicious for pneumonia. Small to moderate size partly loculated right pleural effusion. He was started on IV Rocephin and Zithromax, IR was consulted for thoracentesis. Tumor team mL of fluid was removed. He was found to have loculated pleural effusion. As per lights criteria it was a grade of effusion. Patient also developed A. fib with RVR, started on Cardizem gtt. at 15 mg/h.    Subjective   Patient seen and examined,he is much more alert. Communicating well.Son at bedside.   Assessment/Plan:     1. Sepsis secondary to left lower lobe pneumonia-patient presented with sepsis with leukocytosis, tachycardia. CT chest showed left lower lobe pneumonia. He was started on IV Rocephin and IV Zithromax. Continue supplemental oxygen via nasal cannula.  WBC is down to 16,000 today. 2. Atrial fibrillation with RVR-rate controlled with Cardizem gtt. and Eliquis for anticoagulation. Cardiology following.  Will wean off Cardizem and start Cardizem 30 mg p.o. every 6 hours. 3. Hypernatremia-resolved, today sodium is 956. 4. Metabolic encephalopathy-significantly improved, likely from combination of infectious process from pneumonia as well as opioids.  Morphine was discontinued yesterday.  As per patient's son patient takes oxymorphone 10 mg 4 times a day as needed for pain. 5. Right moderate loculated pleural effusion-ultrasound-guided thoracentesis ordered which was removed to 25 cc of hazy amber fluid. LDH 1318, appears to be  exudative. 6. Obstructive sleep apnea-continue CPAP 7. Goals of care discussion-palliative care was consulted, patient is DNR, continue full scope of care.     COVID-19 Labs  No results for input(s): DDIMER, FERRITIN, LDH, CRP in the last 72 hours.  Lab Results  Component Value Date   [place] NEGATIVE 12/17/2020   [place] NEGATIVE 07/27/2020     Scheduled medications:   . apixaban  2.5 mg Oral BID  . artificial tears  1 application Left Eye TID  . Chlorhexidine Gluconate Cloth  6 each Topical Daily  . Chlorhexidine Gluconate Cloth  6 each Topical Q0600  . diltiazem  30 mg Oral Q6H  . feeding supplement  237 mL Oral BID BM  . mirtazapine  30 mg Oral QHS  . mupirocin ointment  1 application Nasal BID  . naloxegol oxalate  25 mg Oral Daily         CBG: Recent Labs  Lab 12/20/20 2143 12/21/20 0039 12/21/20 0510 12/22/20 2013  GLUCAP 104* 103* 89 124*    SpO2: (!) 88 % O2 Flow Rate (L/min): 2 L/min    CBC: Recent Labs  Lab 12/17/20 1138 12/18/20 0425 12/19/20 0400 12/20/20 0259 12/22/20 0528 12/23/20 0059  WBC 22.9* 22.1* 20.8* 18.9* 21.2* 16.4*  NEUTROABS 20.1*  --  17.7* 15.4*  --   --   HGB 13.1 12.7* 12.5* 12.6* 11.6* 12.3*  HCT 42.6 40.6 39.4 41.7 37.7* 39.9  MCV 92.4 91.9 90.8 93.1 92.6 90.7  PLT 262 294 358 369 333 213    Basic Metabolic Panel: Recent Labs  Lab 12/17/20 1138 12/18/20 0425 12/19/20 0400 12/20/20 0259  NA 147* 149* 147* 144  K 4.0 3.8 3.3* 3.5  CL 108 111  109 109  CO2 33* 26 28 25   GLUCOSE 145* 96 131* 118*  BUN 43* 32* 20 17  CREATININE 1.13 0.84 0.79 0.83  CALCIUM 9.5 8.7* 8.3* 8.2*  MG  --  2.2  --   --      Liver Function Tests: Recent Labs  Lab 12/17/20 1138 12/18/20 0425 12/20/20 0259  AST 54* 52* 45*  ALT 33 34 36  ALKPHOS 81 82 76  BILITOT 0.8 0.7 0.7  PROT 7.0 6.0* 5.6*  ALBUMIN 3.1* 2.6* 2.5*     Antibiotics: Anti-infectives (From admission, onward)   Start     Dose/Rate Route  Frequency Ordered Stop   12/21/20 1400  azithromycin (ZITHROMAX) tablet 500 mg  Status:  Discontinued        500 mg Oral Daily 12/21/20 1150 12/22/20 1221   12/19/20 1100  cefTRIAXone (ROCEPHIN) 1 g in sodium chloride 0.9 % 100 mL IVPB  Status:  Discontinued        1 g 200 mL/hr over 30 Minutes Intravenous Every 24 hours 12/18/20 1106 12/18/20 1108   12/18/20 1500  azithromycin (ZITHROMAX) 500 mg in sodium chloride 0.9 % 250 mL IVPB  Status:  Discontinued        500 mg 250 mL/hr over 60 Minutes Intravenous Every 24 hours 12/18/20 1106 12/21/20 1150   12/18/20 1200  cefTRIAXone (ROCEPHIN) 1 g in sodium chloride 0.9 % 100 mL IVPB        1 g 200 mL/hr over 30 Minutes Intravenous Every 24 hours 12/18/20 1108 12/25/20 1159   12/17/20 1445  azithromycin (ZITHROMAX) 500 mg in sodium chloride 0.9 % 250 mL IVPB        500 mg 250 mL/hr over 60 Minutes Intravenous  Once 12/17/20 1430 12/17/20 1840   12/17/20 1030  cefTRIAXone (ROCEPHIN) 1 g in sodium chloride 0.9 % 100 mL IVPB        1 g 200 mL/hr over 30 Minutes Intravenous  Once 12/17/20 1020 12/17/20 1305       DVT prophylaxis: Eliquis  Code Status: DNR  Family Communication: Discussed with patient's son at bedside.   Consultants:  Palliative care  Cardiology  Procedures:  Thoracentesis    Objective   Vitals:   12/23/20 0200 12/23/20 0300 12/23/20 0500 12/23/20 0530  BP: 113/70 126/63 112/66 112/66  Pulse:   100   Resp: (!) 28 (!) 35 (!) 28 (!) 26  Temp:   98.5 F (36.9 C) 98.5 F (36.9 C)  TempSrc:   Axillary   SpO2:   (!) 88% (!) 88%  Weight:      Height:        Intake/Output Summary (Last 24 hours) at 12/23/2020 1209 Last data filed at 12/23/2020 0500 Gross per 24 hour  Intake 238 ml  Output 650 ml  Net -412 ml    01/20 1901 - 01/22 0700 In: 1465.3 [P.O.:478; I.V.:93.1] Out: 650 [Urine:650]  Filed Weights   12/17/20 2323 12/19/20 0635  Weight: 63.5 kg 57.7 kg    Physical  Examination:   General-appears in no acute distress Heart-S1-S2, regular, no murmur auscultated Lungs-clear to auscultation bilaterally, no wheezing or crackles auscultated Abdomen-soft, nontender, no organomegaly Extremities-no edema in the lower extremities Neuro-alert, oriented x3, no focal deficit noted  Status is: Inpatient  Dispo: The patient is from: Skilled nursing facility              Anticipated d/c is to: Skilled nursing facility  Anticipated d/c date is: 12/25/2020              Patient currently not medically stable for discharge  Barrier to discharge-       Data Reviewed:   Recent Results (from the past 240 hour(s))  Culture, blood (routine x 2)     Status: None   Collection Time: 12/17/20 10:20 AM   Specimen: BLOOD  Result Value Ref Range Status   Specimen Description   Final    BLOOD BLOOD RIGHT FOREARM Performed at [facility] [address] [place], [place] [address]    Special Requests   Final    BOTTLES DRAWN AEROBIC AND ANAEROBIC Blood Culture adequate volume Performed at [facility] [address] [place], [place] [address]    Culture   Final    NO GROWTH 5 DAYS Performed at [facility], [place] [address], [place], [place] [address]    Report Status 12/22/2020 FINAL  Final  Culture, blood (routine x 2)     Status: None   Collection Time: 12/17/20 10:25 AM   Specimen: BLOOD  Result Value Ref Range Status   Specimen Description   Final    BLOOD BLOOD LEFT FOREARM Performed at [facility] [address], [place], [place] [address]    Special Requests   Final    BOTTLES DRAWN AEROBIC AND ANAEROBIC Blood Culture results may not be optimal due to an inadequate volume of blood received in culture bottles Performed at [facility] [address], [place], [place] [address]    Culture   Final    NO GROWTH 5 DAYS Performed at [facility],  [place] [address] [place], [place] [address]    Report Status 12/22/2020 FINAL  Final  SARS CORONAVIRUS 2 (TAT 6-24 HRS) Nasopharyngeal Nasopharyngeal Swab     Status: None   Collection Time: 12/17/20  3:02 PM   Specimen: Nasopharyngeal Swab  Result Value Ref Range Status   SARS Coronavirus 2 NEGATIVE NEGATIVE Final    Comment: (NOTE) SARS-CoV-2 target nucleic acids are NOT DETECTED.  The SARS-CoV-2 RNA is generally detectable in upper and lower respiratory specimens during the acute phase of infection. Negative results do not preclude SARS-CoV-2 infection, do not rule out co-infections with other pathogens, and should not be used as the sole basis for treatment or other patient management decisions. Negative results must be combined with clinical observations, patient history, and epidemiological information. The expected result is Negative.  Fact Sheet for Patients: SugarRoll.be  Fact Sheet for Healthcare Providers: https://www.woods-mathews.com/  This test is not yet approved or cleared by the [place] FDA and  has been authorized for detection and/or diagnosis of SARS-CoV-2 by FDA under an Emergency Use Authorization (EUA). This EUA will remain  in effect (meaning this test can be used) for the duration of the COVID-19 declaration under Se ction 564(b)(1) of the Act, 21 U.S.C. section 360bbb-3(b)(1), unless the authorization is terminated or revoked sooner.  Performed at [facility], [place] [address] [place], [place] [address]   Body fluid culture     Status: None   Collection Time: 12/18/20  1:27 PM   Specimen: PATH Cytology Pleural fluid  Result Value Ref Range Status   Specimen Description   Final    PLEURAL Performed at [facility] [address], [place],  [address]    Special Requests   Final  NONE Performed at [facility], [place] [address], [place], [place] [address]     Gram Stain NO WBC SEEN NO ORGANISMS SEEN   Final   Culture   Final    NO GROWTH 3 DAYS Performed at [facility], [place] [address] [place], [place] [address]    Report Status 12/22/2020 FINAL  Final  MRSA PCR Screening     Status: Abnormal   Collection Time: 12/19/20  6:23 AM   Specimen: Nasopharyngeal  Result Value Ref Range Status   MRSA by PCR POSITIVE (A) NEGATIVE Final    Comment:        The GeneXpert MRSA Assay (FDA approved for NASAL specimens only), is one component of a comprehensive MRSA colonization surveillance program. It is not intended to diagnose MRSA infection nor to guide or monitor treatment for MRSA infections. RESULT CALLED TO, READ BACK BY AND VERIFIED WITH: CODLE,L. RN @1315  ON 01.18.2022 BY COHEN,K Performed at [facility], [place] [address] [place], [place] [address]        Oswald Hillock   [facility] If 7PM-7AM, please contact night-coverage at [email or web address], Office  [phone]   12/23/2020, 12:09 PM  LOS: 6 days

## 2020-12-24 DIAGNOSIS — J189 Pneumonia, unspecified organism: Secondary | ICD-10-CM | POA: Diagnosis not present

## 2020-12-24 DIAGNOSIS — G9341 Metabolic encephalopathy: Secondary | ICD-10-CM | POA: Diagnosis not present

## 2020-12-24 DIAGNOSIS — I4891 Unspecified atrial fibrillation: Secondary | ICD-10-CM | POA: Diagnosis not present

## 2020-12-24 DIAGNOSIS — E870 Hyperosmolality and hypernatremia: Secondary | ICD-10-CM | POA: Diagnosis not present

## 2020-12-24 LAB — CBC
HCT: 38.6 % — ABNORMAL LOW (ref 39.0–52.0)
Hemoglobin: 12.1 g/dL — ABNORMAL LOW (ref 13.0–17.0)
MCH: 27.9 pg (ref 26.0–34.0)
MCHC: 31.3 g/dL (ref 30.0–36.0)
MCV: 88.9 fL (ref 80.0–100.0)
Platelets: 359 10*3/uL (ref 150–400)
RBC: 4.34 MIL/uL (ref 4.22–5.81)
RDW: 14.3 % (ref 11.5–15.5)
WBC: 18.6 10*3/uL — ABNORMAL HIGH (ref 4.0–10.5)
nRBC: 0 % (ref 0.0–0.2)

## 2020-12-24 LAB — URINALYSIS, ROUTINE W REFLEX MICROSCOPIC
Bilirubin Urine: NEGATIVE
Glucose, UA: NEGATIVE mg/dL
Hgb urine dipstick: NEGATIVE
Ketones, ur: NEGATIVE mg/dL
Nitrite: NEGATIVE
Protein, ur: NEGATIVE mg/dL
Specific Gravity, Urine: 1.017 (ref 1.005–1.030)
pH: 5 (ref 5.0–8.0)

## 2020-12-24 MED ORDER — DILTIAZEM HCL 60 MG PO TABS
60.0000 mg | ORAL_TABLET | Freq: Four times a day (QID) | ORAL | Status: DC
  Administered 2020-12-24 – 2020-12-27 (×13): 60 mg via ORAL
  Filled 2020-12-24 (×12): qty 1

## 2020-12-24 MED ORDER — METOPROLOL TARTRATE 5 MG/5ML IV SOLN
5.0000 mg | Freq: Once | INTRAVENOUS | Status: AC
  Administered 2020-12-24: 5 mg via INTRAVENOUS
  Filled 2020-12-24: qty 5

## 2020-12-24 MED ORDER — DILTIAZEM HCL 25 MG/5ML IV SOLN
5.0000 mg | Freq: Once | INTRAVENOUS | Status: AC
  Administered 2020-12-24: 5 mg via INTRAVENOUS
  Filled 2020-12-24: qty 5

## 2020-12-24 NOTE — Progress Notes (Signed)
Dr. Darrick Meigs notified of patient's HR still in the 110-140s, MD already increased Cardizem dose, will continue to assess patient.

## 2020-12-24 NOTE — Progress Notes (Signed)
Patient's heart rate improving, in the low 100s, patient's son at the bedside, updated on plan of care.

## 2020-12-24 NOTE — Progress Notes (Signed)
[facility]  PROGRESS NOTE  Casey Gates. DUK:[id number] DOB: 26-Jun-1926 DOA: 12/17/2020 PCP: Velna Hatchet, MD   Brief HPI:   87 year old male with history of atrial fibrillation on anticoagulation, obstructive sleep apnea, chronic pain syndrome presented to ED for altered mental status. He was found to have right lower lung opacity on chest x-ray. CT of the chest with contrast showed patchy peribronchovascular opacity in the left lower lobe suspicious for pneumonia. Small to moderate size partly loculated right pleural effusion. He was started on IV Rocephin and Zithromax, IR was consulted for thoracentesis. Tumor team mL of fluid was removed. He was found to have loculated pleural effusion. As per lights criteria it was a grade of effusion. Patient also developed A. fib with RVR, started on Cardizem gtt. at 15 mg/h.    Subjective   Patient seen and examined, denies any complaints.  Heart rate in 130s this morning.  He was started on Cardizem 30 mg p.o. every 6 hours.  Usually takes 120 mg twice a day at home.   Assessment/Plan:     1. Sepsis secondary to left lower lobe pneumonia-patient presented with sepsis with leukocytosis, tachycardia. CT chest showed left lower lobe pneumonia. He was started on IV Rocephin and IV Zithromax.  He has completed antibiotics.  Sepsis resolved has resolved.  Continue supplemental oxygen via nasal cannula.  WBC is 18,000 today. 2. Atrial fibrillation with RVR-rate controlled with Cardizem gtt. and Eliquis for anticoagulation. Cardiology following.  IV Cardizem has been weaned off.  Patient started on 30 mg of Cardizem every 6 hours, however heart rate seems to be not well controlled with this.  We will increase the dose of Cardizem to 60 mg p.o. every 6 hours. 3. Hypernatremia-resolved, today sodium is 376. 4. Metabolic encephalopathy-significantly improved, likely from combination of infectious process from pneumonia as well as opioids.   Morphine was discontinued yesterday.  As per patient's son patient takes oxymorphone 10 mg 4 times a day as needed for pain. 5. Right moderate loculated pleural effusion-ultrasound-guided thoracentesis ordered which was removed to 25 cc of hazy amber fluid. LDH 1318, appears to be exudative.  Antibiotics completed as above. 6. Obstructive sleep apnea-continue CPAP 7. Goals of care discussion-palliative care was consulted, patient is DNR, continue full scope of care.     COVID-19 Labs  No results for input(s): DDIMER, FERRITIN, LDH, CRP in the last 72 hours.  Lab Results  Component Value Date   [place] NEGATIVE 12/17/2020   [place] NEGATIVE 07/27/2020     Scheduled medications:   . apixaban  2.5 mg Oral BID  . artificial tears  1 application Left Eye TID  . Chlorhexidine Gluconate Cloth  6 each Topical Daily  . Chlorhexidine Gluconate Cloth  6 each Topical Q0600  . diltiazem  60 mg Oral Q6H  . feeding supplement  237 mL Oral BID BM  . mirtazapine  30 mg Oral QHS  . naloxegol oxalate  25 mg Oral Daily         CBG: Recent Labs  Lab 12/20/20 2143 12/21/20 0039 12/21/20 0510 12/22/20 2013  GLUCAP 104* 103* 89 124*    SpO2: 92 % O2 Flow Rate (L/min): 2 L/min    CBC: Recent Labs  Lab 12/19/20 0400 12/20/20 0259 12/22/20 0528 12/23/20 0059 12/24/20 0545  WBC 20.8* 18.9* 21.2* 16.4* 18.6*  NEUTROABS 17.7* 15.4*  --   --   --   HGB 12.5* 12.6* 11.6* 12.3* 12.1*  HCT 39.4 41.7 37.7*  39.9 38.6*  MCV 90.8 93.1 92.6 90.7 88.9  PLT 358 369 333 343 [id number]    Basic Metabolic Panel: Recent Labs  Lab 12/18/20 0425 12/19/20 0400 12/20/20 0259  NA 149* 147* 144  K 3.8 3.3* 3.5  CL 111 109 109  CO2 26 28 25   GLUCOSE 96 131* 118*  BUN 32* 20 17  CREATININE 0.84 0.79 0.83  CALCIUM 8.7* 8.3* 8.2*  MG 2.2  --   --      Liver Function Tests: Recent Labs  Lab 12/18/20 0425 12/20/20 0259  AST 52* 45*  ALT 34 36  ALKPHOS 82 76  BILITOT 0.7 0.7  PROT  6.0* 5.6*  ALBUMIN 2.6* 2.5*     Antibiotics: Anti-infectives (From admission, onward)   Start     Dose/Rate Route Frequency Ordered Stop   12/21/20 1400  azithromycin (ZITHROMAX) tablet 500 mg  Status:  Discontinued        500 mg Oral Daily 12/21/20 1150 12/22/20 1221   12/19/20 1100  cefTRIAXone (ROCEPHIN) 1 g in sodium chloride 0.9 % 100 mL IVPB  Status:  Discontinued        1 g 200 mL/hr over 30 Minutes Intravenous Every 24 hours 12/18/20 1106 12/18/20 1108   12/18/20 1500  azithromycin (ZITHROMAX) 500 mg in sodium chloride 0.9 % 250 mL IVPB  Status:  Discontinued        500 mg 250 mL/hr over 60 Minutes Intravenous Every 24 hours 12/18/20 1106 12/21/20 1150   12/18/20 1200  cefTRIAXone (ROCEPHIN) 1 g in sodium chloride 0.9 % 100 mL IVPB        1 g 200 mL/hr over 30 Minutes Intravenous Every 24 hours 12/18/20 1108 12/24/20 1225   12/17/20 1445  azithromycin (ZITHROMAX) 500 mg in sodium chloride 0.9 % 250 mL IVPB        500 mg 250 mL/hr over 60 Minutes Intravenous  Once 12/17/20 1430 12/17/20 1840   12/17/20 1030  cefTRIAXone (ROCEPHIN) 1 g in sodium chloride 0.9 % 100 mL IVPB        1 g 200 mL/hr over 30 Minutes Intravenous  Once 12/17/20 1020 12/17/20 1305       DVT prophylaxis: Eliquis  Code Status: DNR  Family Communication: Discussed with patient's son at bedside.   Consultants:  Palliative care  Cardiology  Procedures:  Thoracentesis    Objective   Vitals:   12/24/20 1000 12/24/20 1147 12/24/20 1329 12/24/20 1417  BP: (!) 114/57 121/70 117/62 118/63  Pulse:      Resp: 18  20   Temp: 97.7 F (36.5 C)  97.6 F (36.4 C)   TempSrc: Oral  Oral   SpO2: 92%  92%   Weight:      Height:        Intake/Output Summary (Last 24 hours) at 12/24/2020 1507 Last data filed at 12/24/2020 1300 Gross per 24 hour  Intake 100 ml  Output 1600 ml  Net -1500 ml    01/21 1901 - 01/23 0700 In: 358 [P.O.:358] Out: 3313 [id number]  Filed Weights   12/17/20 2323  12/19/20 0635  Weight: 63.5 kg 57.7 kg    Physical Examination:   General-appears in no acute distress Heart-S1-S2, irregular, no murmur auscultated Lungs-clear to auscultation bilaterally, no wheezing or crackles auscultated Abdomen-soft, nontender, no organomegaly Extremities-no edema in the lower extremities Neuro-alert, oriented x3, no focal deficit noted  Status is: Inpatient  Dispo: The patient is from: Skilled nursing facility  Anticipated d/c is to: Skilled nursing facility              Anticipated d/c date is: 12/25/2020              Patient currently not medically stable for discharge  Barrier to discharge-       Data Reviewed:   Recent Results (from the past 240 hour(s))  Culture, blood (routine x 2)     Status: None   Collection Time: 12/17/20 10:20 AM   Specimen: BLOOD  Result Value Ref Range Status   Specimen Description   Final    BLOOD BLOOD RIGHT FOREARM Performed at [facility], [place] [address] [place], [place] [address]    Special Requests   Final    BOTTLES DRAWN AEROBIC AND ANAEROBIC Blood Culture adequate volume Performed at [facility] [address] [place], [place] [address]    Culture   Final    NO GROWTH 5 DAYS Performed at [facility], [place] [address], [place], [place] [address]    Report Status 12/22/2020 FINAL  Final  Culture, blood (routine x 2)     Status: None   Collection Time: 12/17/20 10:25 AM   Specimen: BLOOD  Result Value Ref Range Status   Specimen Description   Final    BLOOD BLOOD LEFT FOREARM Performed at [facility] [address], [place], [place] [address]    Special Requests   Final    BOTTLES DRAWN AEROBIC AND ANAEROBIC Blood Culture results may not be optimal due to an inadequate volume of blood received in culture bottles Performed at [place] [address], [place], [place] [address]    Culture   Final     NO GROWTH 5 DAYS Performed at [facility], [place] [address], [place], [place] [address]    Report Status 12/22/2020 FINAL  Final  SARS CORONAVIRUS 2 (TAT 6-24 HRS) Nasopharyngeal Nasopharyngeal Swab     Status: None   Collection Time: 12/17/20  3:02 PM   Specimen: Nasopharyngeal Swab  Result Value Ref Range Status   SARS Coronavirus 2 NEGATIVE NEGATIVE Final    Comment: (NOTE) SARS-CoV-2 target nucleic acids are NOT DETECTED.  The SARS-CoV-2 RNA is generally detectable in upper and lower respiratory specimens during the acute phase of infection. Negative results do not preclude SARS-CoV-2 infection, do not rule out co-infections with other pathogens, and should not be used as the sole basis for treatment or other patient management decisions. Negative results must be combined with clinical observations, patient history, and epidemiological information. The expected result is Negative.  Fact Sheet for Patients: SugarRoll.be  Fact Sheet for Healthcare Providers: https://www.woods-mathews.com/  This test is not yet approved or cleared by the [place] FDA and  has been authorized for detection and/or diagnosis of SARS-CoV-2 by FDA under an Emergency Use Authorization (EUA). This EUA will remain  in effect (meaning this test can be used) for the duration of the COVID-19 declaration under Se ction 564(b)(1) of the Act, 21 U.S.C. section 360bbb-3(b)(1), unless the authorization is terminated or revoked sooner.  Performed at [facility], [place] [address] [place], [place] [address]   Body fluid culture     Status: None   Collection Time: 12/18/20  1:27 PM   Specimen: PATH Cytology Pleural fluid  Result Value Ref Range Status   Specimen Description   Final    PLEURAL Performed at [facility], [place] [address], [place], [place] [address]    Special Requests   Final    NONE Performed at [facility], [place] [address] [place], [place] [address]    Gram Stain NO WBC SEEN NO ORGANISMS SEEN   Final   Culture   Final    NO GROWTH 3 DAYS Performed at [facility], [place] [address], [place], [place] [address]    Report Status 12/22/2020 FINAL  Final  MRSA PCR Screening     Status: Abnormal   Collection Time: 12/19/20  6:23 AM   Specimen: Nasopharyngeal  Result Value Ref Range Status   MRSA by PCR POSITIVE (A) NEGATIVE Final    Comment:        The GeneXpert MRSA Assay (FDA approved for NASAL specimens only), is one component of a comprehensive MRSA colonization surveillance program. It is not intended to diagnose MRSA infection nor to guide or monitor treatment for MRSA infections. RESULT CALLED TO, READ BACK BY AND VERIFIED WITH: CODLE,L. RN @1315  ON 01.18.2022 BY COHEN,K Performed at [facility], [place] [address], [place], [place] [address]        Oswald Hillock   [facility] If 7PM-7AM, please contact night-coverage at [email or web address], Office  [phone]   12/24/2020, 3:07 PM  LOS: 7 days

## 2020-12-25 ENCOUNTER — Encounter (HOSPITAL_COMMUNITY): Payer: Self-pay | Admitting: Internal Medicine

## 2020-12-25 ENCOUNTER — Inpatient Hospital Stay (HOSPITAL_COMMUNITY): Payer: Medicare PPO

## 2020-12-25 DIAGNOSIS — G9341 Metabolic encephalopathy: Secondary | ICD-10-CM | POA: Diagnosis not present

## 2020-12-25 DIAGNOSIS — E870 Hyperosmolality and hypernatremia: Secondary | ICD-10-CM | POA: Diagnosis not present

## 2020-12-25 DIAGNOSIS — I4891 Unspecified atrial fibrillation: Secondary | ICD-10-CM | POA: Diagnosis not present

## 2020-12-25 DIAGNOSIS — J189 Pneumonia, unspecified organism: Secondary | ICD-10-CM | POA: Diagnosis not present

## 2020-12-25 DIAGNOSIS — J869 Pyothorax without fistula: Secondary | ICD-10-CM

## 2020-12-25 LAB — BASIC METABOLIC PANEL
Anion gap: 7 (ref 5–15)
BUN: 10 mg/dL (ref 8–23)
CO2: 26 mmol/L (ref 22–32)
Calcium: 7.9 mg/dL — ABNORMAL LOW (ref 8.9–10.3)
Chloride: 109 mmol/L (ref 98–111)
Creatinine, Ser: 0.78 mg/dL (ref 0.61–1.24)
GFR, Estimated: >60 mL/min (ref >60)
Glucose, Bld: 107 mg/dL — ABNORMAL HIGH (ref 70–99)
Potassium: 3.6 mmol/L (ref 3.5–5.1)
Sodium: 142 mmol/L (ref 135–145)

## 2020-12-25 MED ORDER — SODIUM CHLORIDE 0.9 % IV SOLN
2.0000 g | Freq: Two times a day (BID) | INTRAVENOUS | Status: DC
  Administered 2020-12-25 – 2020-12-28 (×7): 2 g via INTRAVENOUS
  Filled 2020-12-25 (×8): qty 2

## 2020-12-25 MED ORDER — VANCOMYCIN HCL IN DEXTROSE 1-5 GM/200ML-% IV SOLN
1000.0000 mg | INTRAVENOUS | Status: DC
  Administered 2020-12-25 – 2020-12-31 (×7): 1000 mg via INTRAVENOUS
  Filled 2020-12-25 (×7): qty 200

## 2020-12-25 MED ORDER — BISACODYL 10 MG RE SUPP
10.0000 mg | Freq: Once | RECTAL | Status: AC
  Administered 2020-12-25: 10 mg via RECTAL
  Filled 2020-12-25: qty 1

## 2020-12-25 NOTE — Care Management Important Message (Signed)
Medicare important message printed for Casey Gates to give to the patient. 

## 2020-12-25 NOTE — Consult Note (Signed)
NAME:  Casey Gates, Casey Gates MRN:  [id number], DOB:  [birth year], LOS: 8 ADMISSION DATE:  12/17/2020, CONSULTATION DATE: 1/24 REFERRING MD:  Darrick Meigs , CHIEF COMPLAINT:  Exudative pleural effusion    Brief History:  87 year old male initially admitted on 1/16 healthcare associated pneumonia and associated right-sided loculated pleural effusion.  Completed broad-spectrum antibiotic course and in spite of appropriate therapy developed worsening loculated right pleural effusion and evolution of air/gas fluid level.  Critical care asked to evaluate  History of Present Illness:  87 year old male with history as mentioned below was admitted on 1/16 with altered mental status.  History initially obtained from the patient's son.  Patient lives at a nursing facility was found to be altered.  Initially nursing facility felt UTI.  As his mental status continued to deteriorate he was eventually brought to the emergency room for evaluation on 1/16.  In the ER he was found to be tachycardic, afebrile, initial white blood cell count 22.9 chest x-ray with right lower lobe vague airspace disease.  A subsequent CT of chest was later obtained demonstrating moderate sized loculated right pleural effusion, left lower lobe airspace disease, right lower lobe airspace disease, very small focal groundglass changes.  Was admitted with working diagnosis of healthcare associated pneumonia and pleural effusion and subsequently underwent thoracentesis on 1/17.  Per lights criteria pleural fluid was consistent with exudative process however no organism was speciated.  He was treated supportively with antibiotics IV hydration and supplemental oxygen.  His hospital course complicated by atrial fibrillation with RVR and ongoing tachycardia, he is since completed antibiotics as of 1/24 and in spite of this follow-up chest x-ray now shows significant right greater than left airspace disease which has had marked progression since prior film on the  17th.  Prior recommendation a repeat CT of chest was obtained this demonstrated a fairly large complicated right pleural effusion with several loculations as well as new formation of gas/air-fluid level in the posterior component.  Because of the radiographic decline critical care was asked to evaluate. Past Medical History:  Obstructive sleep apnea, atrial fibrillation on chronic anticoagulation, chronic pain syndrome. Significant Hospital Events:  1/16 admitted, started on broad-spectrum antibiotics 1/ 17: Exudative pleural effusion by thoracentesis, 225 cc of hazy amber fluid removed 1/18 cardiology consulted for atrial fibrillation with RVR 1/19 azithromycin completed 1/23: Ceftriaxone completed 1/24: Pulmonary asked to evaluate given worsening loculated right pleural effusion  Consults:    Procedures:  thora 1/17   Significant Diagnostic Tests:  CT chest 1/24: 1. Moderate to large loculated right pleural effusion is increased in volume when compared with 12/17/2020. New focus of gas with air-fluid level is noted within the posterior component of the effusion. 2. Progressive atelectasis of the entire right lower lobe with partial atelectasis of the right middle lobe and right upper lobe. 3. Progressive consolidation involving the posteromedial left lower lobe containing air bronchograms. 4. Similar appearance of patchy areas of ground-glass and airspace density within the anterior left upper lobe. Likely post infectious or inflammatory in etiology. 5. Aortic atherosclerosis. Coronary artery calcifications.  Micro Data:    Antimicrobials:   1/16 azith X 5 1/16  -> 1/23 1/24 vanc and cefepime>>> Interim History / Subjective:  General feels well  Objective   Blood pressure 120/68, pulse (Abnormal) 122, temperature 98.1 F (36.7 C), temperature source Oral, resp. rate 18, height 5\' 11"  (1.803 m), weight 57.7 kg, SpO2 94 %.        Intake/Output Summary (Last 24 hours)  at  12/25/2020 1453 Last data filed at 12/25/2020 1314 Gross per 24 hour  Intake 420 ml  Output 1000 ml  Net -580 ml   Filed Weights   12/17/20 2323 12/19/20 0635  Weight: 63.5 kg 57.7 kg    Examination: General: Frail 87 year old white male resting in bed HENT: Speech still slurred otherwise normocephalic Lungs: No accessory use scattered rhonchi Cardiovascular: Regular irregular tachycardic rhythm Abdomen: Soft nontender Extremities: Warm dry Neuro: Awake oriented GU: Voids  Resolved Hospital Problem list     Assessment & Plan:   Pneumonia, favor chronic aspiration Right-sided complicated pleural effusion/empyema Ongoing sepsis Acute metabolic encephalopathy Leukocytosis Atrial fibrillation with RVR  Discussion Patient is developed worsening loculated right pleural effusion with ongoing pneumonia, air-fluid level suggests pleural space remains infected has at least 3 areas of loculation.  I do not think he will improve without definitive drainage.  Have discussed this with his son who is in agreement  Plan Add back empiric antibiotics, nosocomial coverage more appropriate in this setting Hold Eliquis Plan for ultrasound-guided thoracentesis at bedside by pulmonary on 1/25  Best practice (evaluated daily)  Per primary Labs   CBC: Recent Labs  Lab 12/19/20 0400 12/20/20 0259 12/22/20 0528 12/23/20 0059 12/24/20 0545  WBC 20.8* 18.9* 21.2* 16.4* 18.6*  NEUTROABS 17.7* 15.4*  --   --   --   HGB 12.5* 12.6* 11.6* 12.3* 12.1*  HCT 39.4 41.7 37.7* 39.9 38.6*  MCV 90.8 93.1 92.6 90.7 88.9  PLT 358 369 333 343 [id number]    Basic Metabolic Panel: Recent Labs  Lab 12/19/20 0400 12/20/20 0259 12/25/20 0541  NA 147* 144 142  K 3.3* 3.5 3.6  CL 109 109 109  CO2 28 25 26   GLUCOSE 131* 118* 107*  BUN 20 17 10   CREATININE 0.79 0.83 0.78  CALCIUM 8.3* 8.2* 7.9*   GFR: Estimated Creatinine Clearance: 48.1 mL/min (by C-G formula based on SCr of 0.78 mg/dL). Recent Labs   Lab 12/20/20 0259 12/22/20 0528 12/23/20 0059 12/24/20 0545  WBC 18.9* 21.2* 16.4* 18.6*    Liver Function Tests: Recent Labs  Lab 12/20/20 0259  AST 45*  ALT 36  ALKPHOS 76  BILITOT 0.7  PROT 5.6*  ALBUMIN 2.5*   No results for input(s): LIPASE, AMYLASE in the last 168 hours. No results for input(s): AMMONIA in the last 168 hours.  ABG    Component Value Date/Time   HCO3 26.6 12/20/2020 0259   TCO2 22 02/16/2018 0732   O2SAT 61.1 12/20/2020 0259     Coagulation Profile: No results for input(s): INR, PROTIME in the last 168 hours.  Cardiac Enzymes: No results for input(s): CKTOTAL, CKMB, CKMBINDEX, TROPONINI in the last 168 hours.  HbA1C: No results found for: HGBA1C  CBG: Recent Labs  Lab 12/20/20 2143 12/21/20 0039 12/21/20 0510 12/22/20 2013  GLUCAP 104* 103* 89 124*    Review of Systems:   Not able r  Past Medical History:  He,  has a past medical history of Anemia, Asthma, Blind left eye, Cancer ([place]), Cellulitis, Chronic pain syndrome, Chronic ulcer of toe, right, with unspecified severity ([place]), Constipation, DDD (degenerative disc disease), lumbar, Gastrointestinal stromal tumor (GIST) of stomach ([place]), GI bleeding, History of blood transfusion, Insomnia, Kyphoscoliosis, Mass in the abdomen (01/2018), Persistent atrial fibrillation ([place]), Restless leg, Sleep apnea, and Wears glasses.   Surgical History:   Past Surgical History:  Procedure Laterality Date  . AMPUTATION TOE Right 11/05/2018   Procedure: Right 2nd toe ampuation;  Surgeon: Wylene Simmer, MD;  Location: [facility];  Service: Orthopedics;  Laterality: Right;  54min  . CHOLECYSTECTOMY    . COLONOSCOPY    . ESOPHAGOGASTRODUODENOSCOPY (EGD) WITH PROPOFOL Left 02/16/2018   Procedure: ESOPHAGOGASTRODUODENOSCOPY (EGD) WITH PROPOFOL;  Surgeon: [name], MD;  Location: [facility];  Service: Gastroenterology;  Laterality: Left;  . LAPAROSCOPIC GASTRECTOMY N/A 03/12/2018    Procedure: LAPAROSCOPIC ASSISTED PARTIAL GASTRECTOMY ERAS PATHWAY;  Surgeon: Stark Klein, MD;  Location: [facility];  Service: General;  Laterality: N/A;  . LAPAROSCOPIC PARTIAL GASTRECTOMY  03/12/2018   LAPAROSCOPIC ASSISTED PARTIAL GASTRECTOMY ERAS PATHWAY  . PROSTATECTOMY    . QUADRICEPS TENDON REPAIR Left 07/07/2017   Procedure: REPAIR QUADRICEP TENDON;  Surgeon: Rod Can, MD;  Location: [facility];  Service: Orthopedics;  Laterality: Left;  . RIGHT CATARACT EXTRACTION  2013  . RIGTH NEPHRECTOMY  2006  . TONSILLECTOMY    . TRANSURETHRAL RESECTION OF PROSTATE  1997     Social History:   reports that he has never smoked. He has never used smokeless tobacco. He reports previous alcohol use. He reports that he does not use drugs.   Family History:  His family history includes Arthritis in his mother; Bone cancer in his mother; Clotting disorder in his daughter; Hypertension in his mother.   Allergies Allergies  Allergen Reactions  . Coconut Flavor [Flavoring Agent] Hypertension    Anything that is related to coconut---LOSS OF CONSCIOUSNESS (SYNCOPE)  . Coconut Oil Other (See Comments)    Anything that is related to coconut--- LOSS OF CONSCIOUSNESS (SYNCOPE)  . Doxycycline Hyclate Hives  . Food Anaphylaxis    ALLERGIC TO ALL NUTS WITH THE EXCEPTION OF CASHEWS, ALMONDS  . Black [employer]  Other (See Comments)    Passes out ( can only eat almonds or peanuts)  . Coconut Oil Other (See Comments)    Faints   . Doxycycline Rash     Home Medications  Prior to Admission medications   Medication Sig Start Date End Date Taking? Authorizing Provider  apixaban (ELIQUIS) 2.5 MG TABS tablet Take 1 tablet (2.5 mg total) by mouth 2 (two) times daily. 07/24/20  Yes Sherran Needs, NP  diltiazem (CARDIZEM SR) 120 MG 12 hr capsule Take 1 capsule (120 mg total) by mouth 2 (two) times daily. 07/24/20 08/24/20 Yes Sherran Needs, NP  gabapentin (NEURONTIN) 300 MG capsule Take 300 mg by mouth 3  (three) times daily as needed (legs-per MAR). Restless leg 05/14/20  Yes [provider]  mirtazapine (REMERON) 30 MG tablet Take 30 mg by mouth at bedtime. 11/21/20  Yes [provider]  MOVANTIK 25 MG TABS tablet Take 25 mg by mouth daily. 09/10/19  Yes [provider]  oxymorphone (OPANA) 10 MG tablet Take 10 mg by mouth every 6 (six) hours as needed for pain.  07/03/18  Yes [provider]  Jay Schlichter Oil (ARTIFICIAL TEARS) ointment Place 1 application into the left eye 3 (three) times daily. Left lower eye lid   Yes [provider]     Critical care time: NA    Erick Colace ACNP-BC [place] Pager # [phone] OR # [phone] if no answer

## 2020-12-25 NOTE — Progress Notes (Signed)
Pharmacy Antibiotic Note  Casey Gates. is a 87 y.o. male admitted on 12/17/2020 with pneumonia.  He is s/p Rocephin + Zithromax course with worsening pleural effusion.  Pharmacy has been consulted for Cefepime + Vancomycin dosing.  12/25/2020: -Afebrile -Leukocytosis (18.6) -Renal function at baseline (CrCl 2ml/min) -Chest CT with pleural effusion increased in volume from 1/16 and focus of gas  -1/18 MRSA PCR +  Plan: Cefepime 2gm IV q12h Vancomycin 1gm IV q24h to target AUC 400-550 Check Vanc levels at steady state Monitor renal function and cx data    Height: 5\' 11"  (180.3 cm) Weight: 57.7 kg (127 lb 3.3 oz) IBW/kg (Calculated) : 75.3  Temp (24hrs), Avg:98.1 F (36.7 C), Min:98.1 F (36.7 C), Max:98.1 F (36.7 C)  Recent Labs  Lab 12/19/20 0400 12/20/20 0259 12/22/20 0528 12/23/20 0059 12/24/20 0545 12/25/20 0541  WBC 20.8* 18.9* 21.2* 16.4* 18.6*  --   CREATININE 0.79 0.83  --   --   --  0.78    Estimated Creatinine Clearance: 48.1 mL/min (by C-G formula based on SCr of 0.78 mg/dL).    Allergies  Allergen Reactions  . Coconut Flavor [Flavoring Agent] Hypertension    Anything that is related to coconut---LOSS OF CONSCIOUSNESS (SYNCOPE)  . Coconut Oil Other (See Comments)    Anything that is related to coconut--- LOSS OF CONSCIOUSNESS (SYNCOPE)  . Doxycycline Hyclate Hives  . Food Anaphylaxis    ALLERGIC TO ALL NUTS WITH THE EXCEPTION OF CASHEWS, ALMONDS  . Black [employer]  Other (See Comments)    Passes out ( can only eat almonds or peanuts)  . Coconut Oil Other (See Comments)    Faints   . Doxycycline Rash    Antimicrobials this admission: 1/16 CTX >> 1/11 1/16 azith> 1/20 1/24 Vanc>> 1/24 Cefepime>>  Dose adjustments this admission:  Microbiology results: 1/16 BCx: NGF 1/17 Pleural fluid: ngF 1/18 MRSA  +  Thank you for allowing pharmacy to be a part of this patient's care.  Netta Cedars PharmD, BCPS 12/25/2020 4:16 PM

## 2020-12-25 NOTE — Progress Notes (Signed)
[facility]  PROGRESS NOTE  [name]. MWU:[id number] DOB: 05-Dec-1925 DOA: 12/17/2020 PCP: Velna Hatchet, MD   Brief HPI:   87 year old male with history of atrial fibrillation on anticoagulation, obstructive sleep apnea, chronic pain syndrome presented to ED for altered mental status. He was found to have right lower lung opacity on chest x-ray. CT of the chest with contrast showed patchy peribronchovascular opacity in the left lower lobe suspicious for pneumonia. Small to moderate size partly loculated right pleural effusion. He was started on IV Rocephin and Zithromax, IR was consulted for thoracentesis. Tumor team mL of fluid was removed. He was found to have loculated pleural effusion. As per lights criteria it was a grade of effusion. Patient also developed A. fib with RVR, started on Cardizem gtt. at 15 mg/h.    Subjective   Patient seen and examined, denies any complaints.  Heart rate is still elevated to 120s.  Chest x-ray obtained today shows increased right-sided loculated pleural effusion.   Assessment/Plan:     1. Sepsis secondary to left lower lobe pneumonia-patient presented with sepsis with leukocytosis, tachycardia. CT chest showed left lower lobe pneumonia. He was started on IV Rocephin and IV Zithromax.  He has completed antibiotics.  Sepsis resolved has resolved.  Continue supplemental oxygen via nasal cannula.  WBC is 18,000 today. 2. Loculated pleural effusion-chest x-ray obtained today shows large loculated pleural effusion which has increased in size from previous.  Called and discussed with pulmonology, Dr. Chase Caller, he will see patient in consult.  He recommended CT chest without contrast.  CT chest without contrast also shows large loculated pleural effusion. 3. Atrial fibrillation with RVR-patient was started on  Cardizem gtt. and Eliquis for anticoagulation. Cardiology following.  IV Cardizem has been weaned off.  Patient is currently on Cardizem 60  mg p.o. every 6 hours.  Heart rate ranging between 100-1 20s.  Likely from underlying infection as above.  We will continue to monitor. 4. Hypernatremia-resolved, today sodium is 725. 5. Metabolic encephalopathy-significantly improved, likely from combination of infectious process from pneumonia as well as opioids.  Morphine was discontinued yesterday.  As per patient's son patient takes oxymorphone 10 mg 4 times a day as needed for pain. 6. Right moderate loculated pleural effusion-ultrasound-guided thoracentesis ordered which was removed to 25 cc of hazy amber fluid. LDH 1318, appears to be exudative.  Antibiotics completed as above. 7. Obstructive sleep apnea-continue CPAP 8. Goals of care discussion-palliative care was consulted, patient is DNR, continue full scope of care.     COVID-19 Labs  No results for input(s): DDIMER, FERRITIN, LDH, CRP in the last 72 hours.  Lab Results  Component Value Date   [place] NEGATIVE 12/17/2020   [place] NEGATIVE 07/27/2020     Scheduled medications:   . apixaban  2.5 mg Oral BID  . artificial tears  1 application Left Eye TID  . Chlorhexidine Gluconate Cloth  6 each Topical Daily  . diltiazem  60 mg Oral Q6H  . feeding supplement  237 mL Oral BID BM  . mirtazapine  30 mg Oral QHS  . naloxegol oxalate  25 mg Oral Daily         CBG: Recent Labs  Lab 12/20/20 2143 12/21/20 0039 12/21/20 0510 12/22/20 2013  GLUCAP 104* 103* 89 124*    SpO2: 94 % O2 Flow Rate (L/min): 2 L/min    CBC: Recent Labs  Lab 12/19/20 0400 12/20/20 0259 12/22/20 0528 12/23/20 0059 12/24/20 0545  WBC 20.8* 18.9*  21.2* 16.4* 18.6*  NEUTROABS 17.7* 15.4*  --   --   --   HGB 12.5* 12.6* 11.6* 12.3* 12.1*  HCT 39.4 41.7 37.7* 39.9 38.6*  MCV 90.8 93.1 92.6 90.7 88.9  PLT 358 369 333 343 [id number]    Basic Metabolic Panel: Recent Labs  Lab 12/19/20 0400 12/20/20 0259 12/25/20 0541  NA 147* 144 142  K 3.3* 3.5 3.6  CL 109 109 109  CO2 28  25 26   GLUCOSE 131* 118* 107*  BUN 20 17 10   CREATININE 0.79 0.83 0.78  CALCIUM 8.3* 8.2* 7.9*     Liver Function Tests: Recent Labs  Lab 12/20/20 0259  AST 45*  ALT 36  ALKPHOS 76  BILITOT 0.7  PROT 5.6*  ALBUMIN 2.5*     Antibiotics: Anti-infectives (From admission, onward)   Start     Dose/Rate Route Frequency Ordered Stop   12/21/20 1400  azithromycin (ZITHROMAX) tablet 500 mg  Status:  Discontinued        500 mg Oral Daily 12/21/20 1150 12/22/20 1221   12/19/20 1100  cefTRIAXone (ROCEPHIN) 1 g in sodium chloride 0.9 % 100 mL IVPB  Status:  Discontinued        1 g 200 mL/hr over 30 Minutes Intravenous Every 24 hours 12/18/20 1106 12/18/20 1108   12/18/20 1500  azithromycin (ZITHROMAX) 500 mg in sodium chloride 0.9 % 250 mL IVPB  Status:  Discontinued        500 mg 250 mL/hr over 60 Minutes Intravenous Every 24 hours 12/18/20 1106 12/21/20 1150   12/18/20 1200  cefTRIAXone (ROCEPHIN) 1 g in sodium chloride 0.9 % 100 mL IVPB        1 g 200 mL/hr over 30 Minutes Intravenous Every 24 hours 12/18/20 1108 12/24/20 1910   12/17/20 1445  azithromycin (ZITHROMAX) 500 mg in sodium chloride 0.9 % 250 mL IVPB        500 mg 250 mL/hr over 60 Minutes Intravenous  Once 12/17/20 1430 12/17/20 1840   12/17/20 1030  cefTRIAXone (ROCEPHIN) 1 g in sodium chloride 0.9 % 100 mL IVPB        1 g 200 mL/hr over 30 Minutes Intravenous  Once 12/17/20 1020 12/17/20 1305       DVT prophylaxis: Eliquis  Code Status: DNR  Family Communication: Discussed with patient's son at bedside.   Consultants:  Palliative care  Cardiology  Procedures:  Thoracentesis    Objective   Vitals:   12/24/20 1417 12/24/20 1700 12/24/20 2300 12/25/20 0600  BP: 118/63 104/64 114/71 112/74  Pulse:   (!) 109 (!) 113  Resp: 20     Temp: 97.6 F (36.4 C)     TempSrc: Oral     SpO2: 94%  93% 94%  Weight:      Height:        Intake/Output Summary (Last 24 hours) at 12/25/2020 1215 Last data  filed at 12/25/2020 1000 Gross per 24 hour  Intake 240 ml  Output 1000 ml  Net -760 ml    01/22 1901 - 01/24 0700 In: 100 [P.O.:100] Out: 2100 [Urine:2100]  Filed Weights   12/17/20 2323 12/19/20 0635  Weight: 63.5 kg 57.7 kg    Physical Examination:   General-appears in no acute distress Heart-S1-S2, regular, no murmur auscultated Lungs-bilateral rhonchi auscultated Abdomen-soft, nontender, no organomegaly Extremities-no edema in the lower extremities Neuro-alert, oriented x3, no focal deficit noted  Status is: Inpatient  Dispo: The patient is from: Skilled nursing facility  Anticipated d/c is to: Skilled nursing facility              Anticipated d/c date is: 12/28/2020              Patient currently not medically stable for discharge  Barrier to discharge-       Data Reviewed:   Recent Results (from the past 240 hour(s))  Culture, blood (routine x 2)     Status: None   Collection Time: 12/17/20 10:20 AM   Specimen: BLOOD  Result Value Ref Range Status   Specimen Description   Final    BLOOD BLOOD RIGHT FOREARM Performed at [facility], [place] [address], [place], [place] [address]    Special Requests   Final    BOTTLES DRAWN AEROBIC AND ANAEROBIC Blood Culture adequate volume Performed at [facility] [address] [place], [place] [address]    Culture   Final    NO GROWTH 5 DAYS Performed at [facility], [place] [address], [place], [place] [address]    Report Status 12/22/2020 FINAL  Final  Culture, blood (routine x 2)     Status: None   Collection Time: 12/17/20 10:25 AM   Specimen: BLOOD  Result Value Ref Range Status   Specimen Description   Final    BLOOD BLOOD LEFT FOREARM Performed at [place] [address], [place], [place] [address]    Special Requests   Final    BOTTLES DRAWN AEROBIC AND ANAEROBIC Blood Culture results may not be optimal due to an inadequate  volume of blood received in culture bottles Performed at [facility] [address] [place], [place] [address]    Culture   Final    NO GROWTH 5 DAYS Performed at [facility], [place] [address], [place], [place] [address]    Report Status 12/22/2020 FINAL  Final  SARS CORONAVIRUS 2 (TAT 6-24 HRS) Nasopharyngeal Nasopharyngeal Swab     Status: None   Collection Time: 12/17/20  3:02 PM   Specimen: Nasopharyngeal Swab  Result Value Ref Range Status   SARS Coronavirus 2 NEGATIVE NEGATIVE Final    Comment: (NOTE) SARS-CoV-2 target nucleic acids are NOT DETECTED.  The SARS-CoV-2 RNA is generally detectable in upper and lower respiratory specimens during the acute phase of infection. Negative results do not preclude SARS-CoV-2 infection, do not rule out co-infections with other pathogens, and should not be used as the sole basis for treatment or other patient management decisions. Negative results must be combined with clinical observations, patient history, and epidemiological information. The expected result is Negative.  Fact Sheet for Patients: SugarRoll.be  Fact Sheet for Healthcare Providers: https://www.woods-mathews.com/  This test is not yet approved or cleared by the [place] FDA and  has been authorized for detection and/or diagnosis of SARS-CoV-2 by FDA under an Emergency Use Authorization (EUA). This EUA will remain  in effect (meaning this test can be used) for the duration of the COVID-19 declaration under Se ction 564(b)(1) of the Act, 21 U.S.C. section 360bbb-3(b)(1), unless the authorization is terminated or revoked sooner.  Performed at [facility], [place] [address], [place], [place] [address]   Body fluid culture     Status: None   Collection Time: 12/18/20  1:27 PM   Specimen: PATH Cytology Pleural fluid  Result Value Ref Range Status   Specimen Description   Final     PLEURAL Performed at [facility]  [place] [address], [place], [place] [address]    Special Requests   Final    NONE Performed at [facility], [place] [address], [place], [place] [address]    Gram Stain NO WBC SEEN NO ORGANISMS SEEN   Final   Culture   Final    NO GROWTH 3 DAYS Performed at [facility], [place] [address], [place], [place] [address]    Report Status 12/22/2020 FINAL  Final  MRSA PCR Screening     Status: Abnormal   Collection Time: 12/19/20  6:23 AM   Specimen: Nasopharyngeal  Result Value Ref Range Status   MRSA by PCR POSITIVE (A) NEGATIVE Final    Comment:        The GeneXpert MRSA Assay (FDA approved for NASAL specimens only), is one component of a comprehensive MRSA colonization surveillance program. It is not intended to diagnose MRSA infection nor to guide or monitor treatment for MRSA infections. RESULT CALLED TO, READ BACK BY AND VERIFIED WITH: CODLE,L. RN @1315  ON 01.18.2022 BY COHEN,K Performed at [facility], [place] [address] [place], [place] [address]        Oswald Hillock   [facility] If 7PM-7AM, please contact night-coverage at [email or web address], Office  [phone]   12/25/2020, 12:15 PM  LOS: 8 days

## 2020-12-25 NOTE — Progress Notes (Signed)
Called son, [name], and updated on plan of care. Andre Lefort

## 2020-12-26 ENCOUNTER — Inpatient Hospital Stay (HOSPITAL_COMMUNITY): Payer: Medicare PPO

## 2020-12-26 DIAGNOSIS — J869 Pyothorax without fistula: Secondary | ICD-10-CM | POA: Diagnosis not present

## 2020-12-26 DIAGNOSIS — J189 Pneumonia, unspecified organism: Secondary | ICD-10-CM | POA: Diagnosis not present

## 2020-12-26 DIAGNOSIS — I4891 Unspecified atrial fibrillation: Secondary | ICD-10-CM | POA: Diagnosis not present

## 2020-12-26 DIAGNOSIS — E870 Hyperosmolality and hypernatremia: Secondary | ICD-10-CM | POA: Diagnosis not present

## 2020-12-26 DIAGNOSIS — G9341 Metabolic encephalopathy: Secondary | ICD-10-CM | POA: Diagnosis not present

## 2020-12-26 MED ORDER — BUDESONIDE 0.25 MG/2ML IN SUSP
0.2500 mg | Freq: Two times a day (BID) | RESPIRATORY_TRACT | Status: DC
  Administered 2020-12-26 – 2021-01-01 (×10): 0.25 mg via RESPIRATORY_TRACT
  Filled 2020-12-26 (×11): qty 2

## 2020-12-26 MED ORDER — IPRATROPIUM-ALBUTEROL 0.5-2.5 (3) MG/3ML IN SOLN
3.0000 mL | RESPIRATORY_TRACT | Status: DC
  Administered 2020-12-26: 3 mL via RESPIRATORY_TRACT
  Filled 2020-12-26: qty 3

## 2020-12-26 MED ORDER — MIDAZOLAM HCL 2 MG/2ML IJ SOLN
INTRAMUSCULAR | Status: AC
  Filled 2020-12-26: qty 2

## 2020-12-26 MED ORDER — FENTANYL CITRATE (PF) 100 MCG/2ML IJ SOLN
INTRAMUSCULAR | Status: AC
  Filled 2020-12-26: qty 2

## 2020-12-26 MED ORDER — IPRATROPIUM-ALBUTEROL 0.5-2.5 (3) MG/3ML IN SOLN
3.0000 mL | Freq: Three times a day (TID) | RESPIRATORY_TRACT | Status: DC
  Administered 2020-12-27 – 2020-12-30 (×8): 3 mL via RESPIRATORY_TRACT
  Filled 2020-12-26 (×9): qty 3

## 2020-12-26 MED ORDER — LIDOCAINE-EPINEPHRINE 1 %-1:100000 IJ SOLN
INTRAMUSCULAR | Status: AC | PRN
  Administered 2020-12-26: 10 mL

## 2020-12-26 NOTE — H&P (Addendum)
Chief Complaint: Empyema. Request is for right sided chest tube  Referring Physician(s): Salvadore Dom  Supervising Physician: Sandi Mariscal  Patient Status: [facility] - In-pt  History of Present Illness: Casey Puck. is a 87 y.o. male History of sleep apnea,  a fib on anticoagulation, Presented to the ED at [facility] on 1.16.22 with AMS found to have healthcare associated pneumonia and a  loculated right pleural effusion. IR performed a thoracentesis on 1.17.22 with  225 ml of exudate removed.  Casey Gates completed a broad spectrum course of antibiotics however  CT Chest from 1.24.22 that shows an increase in the right sided pleural effusion. Team is requesting a right sided chest tube for empyema. Casey Gates Currently without any significant complaints. He is requesting something to drink at the time of the assessment. Patient's son is at bedside.  Discussed care of right sided chest tube placement with patient and his son. All questions were answered and concerns were addressed. Patient and his son agree with plan.    Past Medical History:  Diagnosis Date  . Anemia   . Asthma    AS A CHILD  . Blind left eye   . Cancer [facility])    PROSTATE CANCER  . Cellulitis    frequently, "all over"  . Chronic pain syndrome   . Chronic ulcer of toe, right, with unspecified severity ([place])    2nd toe  . Constipation   . DDD (degenerative disc disease), lumbar   . Gastrointestinal stromal tumor (GIST) of stomach ([place])   . GI bleeding   . History of blood transfusion    01/2018  . Insomnia   . Kyphoscoliosis   . Mass in the abdomen 01/2018  . Persistent atrial fibrillation ([place])   . Restless leg   . Sleep apnea    does not wear CPAP  . Wears glasses     Past Surgical History:  Procedure Laterality Date  . AMPUTATION TOE Right 11/05/2018   Procedure: Right 2nd toe ampuation;  Surgeon: Wylene Simmer, MD;  Location: [place];  Service: Orthopedics;  Laterality: Right;  44min  .  CHOLECYSTECTOMY    . COLONOSCOPY    . ESOPHAGOGASTRODUODENOSCOPY (EGD) WITH PROPOFOL Left 02/16/2018   Procedure: ESOPHAGOGASTRODUODENOSCOPY (EGD) WITH PROPOFOL;  Surgeon: [name], MD;  Location: [place];  Service: Gastroenterology;  Laterality: Left;  . LAPAROSCOPIC GASTRECTOMY N/A 03/12/2018   Procedure: LAPAROSCOPIC ASSISTED PARTIAL GASTRECTOMY ERAS PATHWAY;  Surgeon: Stark Klein, MD;  Location: [place];  Service: General;  Laterality: N/A;  . LAPAROSCOPIC PARTIAL GASTRECTOMY  03/12/2018   LAPAROSCOPIC ASSISTED PARTIAL GASTRECTOMY ERAS PATHWAY  . PROSTATECTOMY    . QUADRICEPS TENDON REPAIR Left 07/07/2017   Procedure: REPAIR QUADRICEP TENDON;  Surgeon: Rod Can, MD;  Location: [place];  Service: Orthopedics;  Laterality: Left;  . RIGHT CATARACT EXTRACTION  2013  . RIGTH NEPHRECTOMY  2006  . TONSILLECTOMY    . TRANSURETHRAL RESECTION OF PROSTATE  1997    Allergies: Coconut flavor [flavoring agent], Coconut oil, Doxycycline hyclate, Food, Black walnut flavor, Coconut oil, and Doxycycline  Medications: Prior to Admission medications   Medication Sig Start Date End Date Taking? Authorizing Provider  apixaban (ELIQUIS) 2.5 MG TABS tablet Take 1 tablet (2.5 mg total) by mouth 2 (two) times daily. 07/24/20  Yes Sherran Needs, NP  diltiazem (CARDIZEM SR) 120 MG 12 hr capsule Take 1 capsule (120 mg total) by mouth 2 (two) times daily. 07/24/20 08/24/20 Yes Sherran Needs,  NP  gabapentin (NEURONTIN) 300 MG capsule Take 300 mg by mouth 3 (three) times daily as needed (legs-per MAR). Restless leg 05/14/20  Yes [provider]  mirtazapine (REMERON) 30 MG tablet Take 30 mg by mouth at bedtime. 11/21/20  Yes [provider]  MOVANTIK 25 MG TABS tablet Take 25 mg by mouth daily. 09/10/19  Yes [provider]  oxymorphone (OPANA) 10 MG tablet Take 10 mg by mouth every 6 (six) hours as needed for pain.  07/03/18  Yes [provider]  Rayburn Ma  Oil (ARTIFICIAL TEARS) ointment Place 1 application into the left eye 3 (three) times daily. Left lower eye lid   Yes [provider]     Family History  Problem Relation Age of Onset  . Bone cancer Mother   . Hypertension Mother   . Arthritis Mother   . Clotting disorder Daughter        died of blood clot     Social History   Socioeconomic History  . Marital status: Married    Spouse name: Not on file  . Number of children: 2  . Years of education: Not on file  . Highest education level: Not on file  Occupational History  . Not on file  Tobacco Use  . Smoking status: Never Smoker  . Smokeless tobacco: Never Used  Vaping Use  . Vaping Use: Never used  Substance and Sexual Activity  . Alcohol use: Not Currently    Comment: quit 1985, 2 fifths/ week,S/P rehab  . Drug use: Never  . Sexual activity: Not Currently  Other Topics Concern  . Not on file  Social History Narrative   ** Merged History Encounter **       Caffeine 2-3 cups average.  Widowed,  Lives at [facility], [address] [facility].  Retired Pensions consultant.  One son.   Social Determinants of Health   Financial Resource Strain: Not on file  Food Insecurity: Not on file  Transportation Needs: Not on file  Physical Activity: Not on file  Stress: Not on file  Social Connections: Not on file    Review of Systems: A 12 point ROS discussed and pertinent positives are indicated in the HPI above.  All other systems are negative.  Review of Systems  Constitutional: Negative for fever.  HENT: Negative for congestion.   Respiratory: Negative for cough and shortness of breath.   Cardiovascular: Negative for chest pain.  Gastrointestinal: Negative for abdominal pain.  Neurological: Negative for headaches.  Psychiatric/Behavioral: Negative for behavioral problems and confusion.    Vital Signs: BP 131/82 (BP Location: Right Arm)   Pulse (!) 120   Temp 98.1 F (36.7 C) (Oral)   Resp 18   Ht 5\' 11"  (1.803 m)    Wt 127 lb 3.3 oz (57.7 kg)   SpO2 94%   BMI 17.74 kg/m   Physical Exam Vitals and nursing note reviewed.  Constitutional:      Appearance: He is well-developed and well-nourished.  HENT:     Head: Normocephalic.  Cardiovascular:     Rate and Rhythm: Tachycardia present. Rhythm irregular.     Heart sounds: Normal heart sounds.  Pulmonary:     Effort: Pulmonary effort is normal.     Comments: Diminished right sided breath sounds Musculoskeletal:        General: Normal range of motion.     Cervical back: Normal range of motion.  Skin:    General: Skin is dry.  Neurological:  Mental Status: He is alert and oriented to person, place, and time.  Psychiatric:        Mood and Affect: Mood and affect normal.     Imaging: DG Chest 1 View  Result Date: 12/18/2020 CLINICAL DATA:  Post thesis EXAM: CHEST  1 VIEW COMPARISON:  Portable exam 1356 hours compared to 12/17/2020 FINDINGS: Upper normal size of cardiac silhouette. Mediastinal contours and pulmonary vascularity normal. Atherosclerotic calcification aorta. Partially loculated RIGHT pleural effusion again identified. No pneumothorax following thoracentesis. Persistent atelectasis versus infiltrate at medial LEFT lower lobe. Bones demineralized. IMPRESSION: No pneumothorax following thoracentesis. Persistent partially loculated RIGHT pleural effusion and LEFT basilar atelectasis versus infiltrate. Electronically Signed   By: Lavonia Dana M.D.   On: 12/18/2020 14:06   CT HEAD WO CONTRAST  Result Date: 12/17/2020 CLINICAL DATA:  UTI with AMS Patient had 75 cc of isvoue 300 at 1545 EXAM: CT HEAD WITHOUT CONTRAST TECHNIQUE: Contiguous axial images were obtained from the base of the skull through the vertex without intravenous contrast. COMPARISON:  09/01/2020 a lesion FINDINGS: Brain: No evidence of acute infarction, hemorrhage, hydrocephalus, extra-axial collection or mass lesion/mass effect. There is ventricular sulcal enlargement  reflecting diffuse atrophy stable from the prior study. Mild patchy areas of white matter hypoattenuation are also present and stable consistent with chronic microvascular ischemic change. Vascular: No hyperdense vessel or unexpected calcification. Skull: Normal. Negative for fracture or focal lesion. Sinuses/Orbits: Globes and orbits are unremarkable. Visualized sinuses are essentially clear. Other: None. IMPRESSION: 1. No acute intracranial abnormalities. 2. Atrophy and mild chronic microvascular ischemic change stable from prior study. Electronically Signed   By: Lajean Manes M.D.   On: 12/17/2020 18:27   CT CHEST WO CONTRAST  Result Date: 12/25/2020 CLINICAL DATA:  Evaluate for effusion or abscess. EXAM: CT CHEST WITHOUT CONTRAST TECHNIQUE: Multidetector CT imaging of the chest was performed following the standard protocol without IV contrast. COMPARISON:  12/17/2020 . FINDINGS: Cardiovascular: Cardiac enlargement, unchanged. Aortic atherosclerosis identified. Coronary artery atherosclerotic calcifications. No pericardial effusion. Mediastinum/Nodes: Thyroid gland is unremarkable. The trachea appears patent and is midline. Normal appearance of the esophagus. No enlarged axillary, supraclavicular, or mediastinal lymph nodes. Hilar lymph nodes are suboptimally evaluated due to lack of IV contrast material. Lungs/Pleura: Moderate to large loculated right pleural effusion appears increased in volume when compared with 12/17/2020. Fluid is now extending over the right lung apex. Gas with air-fluid level is noted within the posterior component which is new from previous exam, image 106/2. There is atelectasis of the entire right lower lobe with partial atelectasis of the right middle lobe and right upper lobe. Progressive consolidation involving the posteromedial left lower lobe containing air bronchograms. Trace left pleural fluid. Patchy atelectasis is identified within the superior segment of left lower lobe.  Several persistent patchy areas of ground-glass and airspace density noted within the anterior left upper lobe, similar to previous exam. Upper Abdomen: No acute abnormality. Previous cholecystectomy. Extensive aortic atherosclerosis with branch vessel disease. Trace amount of perihepatic ascites is new from previous exam. Musculoskeletal: Degenerative disc disease is identified within the thoracic and lumbar spine. Thoracolumbar scoliosis. IMPRESSION: 1. Moderate to large loculated right pleural effusion is increased in volume when compared with 12/17/2020. New focus of gas with air-fluid level is noted within the posterior component of the effusion. 2. Progressive atelectasis of the entire right lower lobe with partial atelectasis of the right middle lobe and right upper lobe. 3. Progressive consolidation involving the posteromedial left lower lobe containing air  bronchograms. 4. Similar appearance of patchy areas of ground-glass and airspace density within the anterior left upper lobe. Likely post infectious or inflammatory in etiology. 5. Aortic atherosclerosis. Coronary artery calcifications. Aortic Atherosclerosis (ICD10-I70.0). Electronically Signed   By: Kerby Moors M.D.   On: 12/25/2020 11:18   CT Chest W Contrast  Result Date: 12/17/2020 CLINICAL DATA:  Follow-up for abnormal chest radiograph. Patient history of confusion/altered mental status. History of gastrointestinal carcinoma. EXAM: CT CHEST WITH CONTRAST TECHNIQUE: Multidetector CT imaging of the chest was performed during intravenous contrast administration. CONTRAST:  17mL OMNIPAQUE IOHEXOL 300 MG/ML  SOLN COMPARISON:  04/06/2020 and current chest radiograph FINDINGS: Cardiovascular: Heart is mildly enlarged. Dense three-vessel coronary artery calcifications. Ascending aorta dilated to 4.2 cm. Aortic atherosclerotic calcifications. Mediastinum/Nodes: No neck base, mediastinal or hilar masses or enlarged lymph nodes. Trachea is unremarkable  esophagus is normal in caliber. Lungs/Pleura: Moderate partly loculated right pleural effusion. Loculated fluid is noted along the mid to upper oblique fissure. Bronchus intermedius is partly effaced. There is atelectasis a significant portion of the right lower lobe. Central opacity with mild bronchiectasis is noted in the posteroinferior right middle lobe. There is patchy opacity in a peribronchovascular distribution in the left lower lobe that may also be atelectasis, although has a configuration suggesting at least a component of pneumonia. 17 mm focus of ground-glass opacity is noted peripherally in the left upper lobe, centered on image 61, series 5. No evidence of pulmonary edema. No lung mass or suspicious nodule. No pneumothorax. Upper Abdomen: No acute findings. Status post cholecystectomy. Increase in colonic stool burden. Musculoskeletal: No fracture or acute finding.  No bone lesion. IMPRESSION: 1. Patchy peribronchovascular opacity in the left lower suspicious for pneumonia. 2. Small to moderate-sized partly loculated right pleural effusion. Significant atelectasis of the right lower lobe, with a smaller area of atelectasis/scarring of the right middle lobe. 3. Small focus of ground-glass opacity in the peripheral left upper lobe. This may reflect additional infection. It also qualifies as a ground-glass nodule in follow-up is recommended. Initial follow-up with CT at 6-12 months is recommended to confirm persistence. If persistent, repeat CT is recommended every 2 years until 5 years of stability has been established. This recommendation follows the consensus statement: Guidelines for Management of Incidental Pulmonary Nodules Detected on CT Images: From the Fleischner Society 2017; Radiology 2017; 284:228-243. 4. Mild cardiomegaly. Dense three-vessel coronary artery calcifications. Aortic atherosclerosis. 5. Ascending aorta dilated to 4.1 cm. Recommend annual imaging followup by CTA or MRA. This  recommendation follows 2010 ACCF/AHA/AATS/ACR/ASA/SCA/SCAI/SIR/STS/SVM Guidelines for the Diagnosis and Management of Patients with Thoracic Aortic Disease. Circulation. 2010; [id number]. Aortic aneurysm NOS (ICD10-I71.9) Aortic Atherosclerosis (ICD10-I70.0). Electronically Signed   By: Lajean Manes M.D.   On: 12/17/2020 16:25   DG Chest Port 1 View  Result Date: 12/17/2020 CLINICAL DATA:  87 year old male with altered mental status. Confusion. History of gastrointestinal cancer stated on prior CT Chest, Abdomen, and Pelvis - most recently 04/06/2020. EXAM: PORTABLE CHEST 1 VIEW COMPARISON:  Portable chest 09/11/2020 and earlier, including chest CT 04/26/2020. FINDINGS: Portable AP semi upright view at 1141 hours. Moderate veiling opacity in the right lower lung is new since October. Obscured right hemidiaphragm. Lung volumes appear stable. Mediastinal contours appear stable. Allowing for portable technique the left lung is clear. No pneumothorax or pulmonary edema. Visualized tracheal air column is within normal limits. Stable surgical clips in the right upper quadrant. Chronic right lateral rib fractures appear stable. IMPRESSION: 1. Moderate size new veiling  opacity in the right lower lung. This could be a moderate size right pleural effusion with atelectasis, or a combination of pleural fluid with pneumonia or new lung mass (history of GI stromal tumor). Chest CT (IV contrast preferred) would best characterize further. 2. Left lung remains clear. Electronically Signed   By: Genevie Ann M.D.   On: 12/17/2020 11:59   DG Chest Port 1V same Day  Result Date: 12/25/2020 CLINICAL DATA:  Pneumonia EXAM: PORTABLE CHEST 1 VIEW COMPARISON:  12/18/2020 FINDINGS: Dense right upper lobe opacity where there was loculated pleural fluid by prior CT and now the shape of a right upper lobe collapse. Increased dense right lower lobe opacity. Continued retrocardiac airspace disease. Normal heart size. IMPRESSION: 1.  Significant worsening of right-sided aeration from increasing loculated pleural fluid. There is likely superimposed right upper lobe collapse. 2. Continued retrocardiac infiltrate. Electronically Signed   By: Monte Fantasia M.D.   On: 12/25/2020 04:16   US THORACENTESIS ASP PLEURAL SPACE W/IMG GUIDE  Result Date: 12/18/2020 INDICATION: Patient with history of pneumonia, encephalopathy, atrial fibrillation, loculated right pleural effusion; request received for diagnostic and therapeutic right thoracentesis. EXAM: ULTRASOUND GUIDED DIAGNOSTIC AND THERAPEUTIC RIGHT THORACENTESIS MEDICATIONS: 1% lidocaine to skin and subcutaneous tissue COMPLICATIONS: None immediate. PROCEDURE: An ultrasound guided thoracentesis was thoroughly discussed with the patient's son and questions answered. The benefits, risks, alternatives and complications were also discussed. The patient's son understands and wishes to proceed with the procedure. Written consent was obtained. Ultrasound was performed to localize and mark an adequate pocket of fluid in the right chest. The area was then prepped and draped in the normal sterile fashion. 1% Lidocaine was used for local anesthesia. Under ultrasound guidance a 6 Fr Safe-T-Centesis catheter was introduced. Thoracentesis was performed. The catheter was removed and a dressing applied. FINDINGS: A total of approximately 225 cc of hazy,amber fluid was removed. Samples were sent to the laboratory as requested by the clinical team. The pleural effusion was multiloculated by ultrasound today. IMPRESSION: Successful ultrasound guided diagnostic and therapeutic right thoracentesis yielding 225 cc of pleural fluid. Read by: Rowe Robert, PA-C Electronically Signed   By: Sandi Mariscal M.D.   On: 12/18/2020 13:35    Labs:  CBC: Recent Labs    12/20/20 0259 12/22/20 0528 12/23/20 0059 12/24/20 0545  WBC 18.9* 21.2* 16.4* 18.6*  HGB 12.6* 11.6* 12.3* 12.1*  HCT 41.7 37.7* 39.9 38.6*  PLT 369  333 343 359    COAGS: Recent Labs    09/01/20 0824  INR 1.2    BMP: Recent Labs    06/26/20 0005 07/13/20 0944 07/27/20 1230 09/01/20 0824 10/13/20 0831 12/18/20 0425 12/19/20 0400 12/20/20 0259 12/25/20 0541  NA 141 142 139 143   < > 149* 147* 144 142  K 4.5 4.2 4.4 4.0   < > 3.8 3.3* 3.5 3.6  CL 104 108 102 106   < > 111 109 109 109  CO2 28 28 27 29    < > 26 28 25 26   GLUCOSE 112* 58* 102* 88   < > 96 131* 118* 107*  BUN 21 19 22  24*   < > 32* 20 17 10   CALCIUM 8.9 9.3 9.2 9.4   < > 8.7* 8.3* 8.2* 7.9*  CREATININE 0.97 1.05 1.08 0.98   < > 0.84 0.79 0.83 0.78  GFRNONAA >60 >60 60* >60   < > >60 >60 >60 >60  GFRAA >60 >60 >60 >60  --   --   --   --   --    < > =  values in this interval not displayed.    LIVER FUNCTION TESTS: Recent Labs    10/13/20 0831 12/17/20 1138 12/18/20 0425 12/20/20 0259  BILITOT 0.6 0.8 0.7 0.7  AST 33 54* 52* 45*  ALT 22 33 34 36  ALKPHOS 78 81 82 76  PROT 6.2* 7.0 6.0* 5.6*  ALBUMIN 3.6 3.1* 2.6* 2.5*    Assessment and Plan: 87 y.o. male inpatient. History of sleep apnea,  a fib on anticoagulation, Presented to the ED at [facility] on 1.16.22 with AMS found to have healthcare associated pneumonia and a  loculated right pleural effusion. IR performed a thoracentesis on 1.17.22 with  225 ml of exudate removed.  Casey Gates completed a broad spectrum course of antibiotics however  CT Chest from 1.24.22 that shows an increase in the right sided pleural effusion. Team is requesting a right sided chest tube for empyema.   Cultures negative X 3 days. WBC is 18.6. Negative COVID on 1.16.22. Eliquis 2.5 mg last given on 1.24.22 @ 11:22. Allergeis include doxycycline. Patient made NPO after breakfast. Critical care Team has spoke to CT surgery who states that the patient is not a candidate for a VATS.  IR consulted for possible right sided. Case has been reviewed and procedure approved by Dr. Pascal Lux.  Patient tentatively scheduled for 1.25.22.  Team  instructed to: Keep Patient to be NPO after midnight Continue to hold eliquis prior to scheduled procedure.  IR will call patient when ready.   Risks and benefits discussed with the patient  And the patient's son who was at bedside including bleeding, infection, damage to adjacent structures, bowel perforation/fistula connection, and sepsis.  All of the patient's questions were answered, patient and the patient's son are agreeable to proceed. Consent signed and in chart.    Thank you for this interesting consult.  I greatly enjoyed meeting Casey Gates. and look forward to participating in their care.  A copy of this report was sent to the requesting provider on this date.  Electronically Signed: Jacqualine Mau, NP 12/26/2020, 9:18 AM   I spent a total of 40 Minutes    in face to face in clinical consultation, greater than 50% of which was counseling/coordinating care for right sided chest tube placement

## 2020-12-26 NOTE — Progress Notes (Addendum)
Nutrition Follow-up  DOCUMENTATION CODES:   Underweight  INTERVENTION:  - continue Ensure Enlive BID. - will complete NFPE when feasible. - re-weigh patient today.  NUTRITION DIAGNOSIS:   Increased nutrient needs related to acute illness as evidenced by estimated needs. -ongoing  GOAL:   Patient will meet greater than or equal to 90% of their needs -minimally met on average   MONITOR:   PO intake,Supplement acceptance,Labs,Weight trends  ASSESSMENT:   87 year old male with medical history of afib on anticoagulation, OSA, and chronic pain syndrome. He presented to the ED due to AMS. CXR in the ED showed RLL opacity. CT chest showed patchy peribronchovascular opacity suspicious for PNA, small to moderate-sized partly loculated R pleural effusion, and significant atelectasis of the RLL, with a smaller area of atelectasis/scarring of the R middle lobe.  Diet changed from Regular, thin liquids to Dysphagia 3, nectar-thick liquids on 1/18 at 1300. He was made NPO today at 0905. Intakes since 1/20 have been 25-85%, mainly 50-85%.   Per review of order, he has accepted Ensure about 50% of the time offered.   He has not been weighed since 1/18. RLE edema documented in the flow sheet, but severity of edema not specified.   Patient laying in bed with no family or visitors present. He reports that he ate most of breakfast which consisted of scrambled egg, sausage, orange juice, and milk. He did not eat cereal that was also on meal tray.   He denies any chewing difficulties now or PTA but does state a fear of choking so he chews thoroughly and takes his time. He does not have any overt issues with swallowing and no pain with swallowing, per his report.   Patient lives alone and gets all meals from the independent living facility at which he lives. He does not cook.   Discussed Ensure supplements with patient. He reports drinking at least part of the bottle each time he receives it because  he wants to regain strength in order to return to independent living and recognizes that adequate nutrition plays a role in this.   Patient declined NFPE to be done at this time. Based on visualization of patient's upper body, suspect some degree of malnutrition although unable to specify degree without NFPE.    Labs reviewed; Ca: 7.9 mg/dl. Medications reviewed. IVF; D5-NS @ 75 ml/hr (306 kcal/24 hours).     NUTRITION - FOCUSED PHYSICAL EXAM:  patient declined and asked this not be done at this time.   Diet Order:   Diet Order            Diet NPO time specified Except for: Sips with Meds  Diet effective now                 EDUCATION NEEDS:   Education needs have been addressed  Skin:  Skin Assessment: Skin Integrity Issues: Skin Integrity Issues:: Other (Comment) Other: MASD coccyx  Last BM:  1/21  Height:   Ht Readings from Last 1 Encounters:  12/17/20 $RemoveB'5\' 11"'[id number]$  (1.803 m)    Weight:   Wt Readings from Last 1 Encounters:  12/19/20 57.7 kg    Estimated Nutritional Needs:  Kcal:  1500-1700 kcal Protein:  70-80 grams Fluid:  >/= 1.8 L/day      Jarome Matin, MS, RD, LDN, CNSC Inpatient Clinical Dietitian RD pager # available in [place]  After hours/weekend pager # available in [facility]

## 2020-12-26 NOTE — Progress Notes (Signed)
PT Cancellation Note  Patient Details Name: Casey Gates. MRN: [id number] DOB: [birth year]   Cancelled Treatment:    Reason Eval/Treat Not Completed: Fatigue/lethargy limiting ability to participate;Patient at procedure or test/unavailable Pt declines today.  Son present and prefers to hold today until after planned procedure.  Pt and son agreeable for PT to check back later in the week (preferrably Thursday).   Maddux First,KATHrine E 12/26/2020, 10:22 AM Arlyce Dice, DPT Acute Rehabilitation Services Pager: [phone] Office: [phone]

## 2020-12-26 NOTE — Procedures (Signed)
Pre procedural Dx: Loculated right sided pleural effusion Post procedural Dx: Same  Technically successful CT guided placed of a 14 Fr drainage catheter placement into the basilar aspect of the right pleural space yielding 20 cc of blood tinged fluid.   An aspirated sample was capped and sent to the laboratory for analysis.    EBL: Trace Complications: None immediate  Ronny Bacon, MD Pager #: [phone]

## 2020-12-26 NOTE — Progress Notes (Signed)
SLP Cancellation Note  Patient Details Name: Casey Gates. MRN: [id number] DOB: 01/21/1926   Cancelled treatment:       Reason Eval/Treat Not Completed: Other (comment) (pt remains npo- s/p chest tube placement)  [name], MS [facility] SLP Acute Rehab Services Office [phone] Pager [phone]    [name] 12/26/2020, 3:45 PM

## 2020-12-26 NOTE — Progress Notes (Signed)
[facility]  PROGRESS NOTE  Casey Gates. HKV:[id number] DOB: 1926-07-30 DOA: 12/17/2020 PCP: Velna Hatchet, MD   Brief HPI:   87 year old male with history of atrial fibrillation on anticoagulation, obstructive sleep apnea, chronic pain syndrome presented to ED for altered mental status. He was found to have right lower lung opacity on chest x-ray. CT of the chest with contrast showed patchy peribronchovascular opacity in the left lower lobe suspicious for pneumonia. Small to moderate size partly loculated right pleural effusion. He was started on IV Rocephin and Zithromax, IR was consulted for thoracentesis. Tumor team mL of fluid was removed. He was found to have loculated pleural effusion. As per lights criteria it was a grade of effusion. Patient also developed A. fib with RVR, started on Cardizem gtt. at 15 mg/h.    Subjective   Patient seen and examined, denies chest pain or shortness of breath.  Plan for chest tube placement today per IR.  Appreciate pulmonology consultation.   Assessment/Plan:     1. Sepsis secondary to left lower lobe pneumonia-patient presented with sepsis with leukocytosis, tachycardia. CT chest showed left lower lobe pneumonia. He was started on IV Rocephin and IV Zithromax.  He has completed antibiotics.  Sepsis physiology has resolved.  Continue supplemental oxygen via nasal cannula.  WBC is 18,000 today. 2. Loculated pleural effusion-patient underwent ultrasound-guided thoracentesis and removed to 25 cc of hazy amber fluid.  LDH 1318, appears to be exudative.  Antibiotics completed as above.  Repeat chest x-ray obtained today shows large loculated pleural effusion which has increased in size from previous.  Pulmonology was consulted.  CT chest without contrast obtained which shows called and discussed with pulmonology, Dr. Chase Caller, he will see patient in consult.  He recommended CT chest without contrast.  CT chest without contrast was obtained  which shows moderate to large loculated right pleural effusion which is increased in volume as compared to previous.  New focus of gas with air-fluid level noted in the posterior compartment of the effusion.  Plan for chest tube placement per IR today.   3. Atrial fibrillation with RVR-patient was started on  Cardizem gtt. and Eliquis for anticoagulation. Cardiology following.  IV Cardizem has been weaned off.  Patient is currently on Cardizem 60 mg p.o. every 6 hours.  Heart rate ranging between 100-120s.  Likely from underlying infection as above. We will continue to monitor. 4. Hypernatremia-resolved, today sodium is 564. 5. Metabolic encephalopathy-significantly improved, likely from combination of infectious process from pneumonia as well as opioids.  Morphine was discontinued yesterday.  As per patient's son patient takes oxymorphone 10 mg 4 times a day as needed for pain. 6. Obstructive sleep apnea-continue CPAP 7. Goals of care discussion-palliative care was consulted, patient is DNR, continue full scope of care.     COVID-19 Labs  No results for input(s): DDIMER, FERRITIN, LDH, CRP in the last 72 hours.  Lab Results  Component Value Date   SARSCOV2NAA NEGATIVE 12/17/2020   [place] NEGATIVE 07/27/2020     Scheduled medications:   . artificial tears  1 application Left Eye TID  . Chlorhexidine Gluconate Cloth  6 each Topical Daily  . diltiazem  60 mg Oral Q6H  . feeding supplement  237 mL Oral BID BM  . mirtazapine  30 mg Oral QHS         CBG: Recent Labs  Lab 12/20/20 2143 12/21/20 0039 12/21/20 0510 12/22/20 2013  GLUCAP 104* 103* 89 124*    SpO2: 94 %  O2 Flow Rate (L/min): 3 L/min    CBC: Recent Labs  Lab 12/20/20 0259 12/22/20 0528 12/23/20 0059 12/24/20 0545  WBC 18.9* 21.2* 16.4* 18.6*  NEUTROABS 15.4*  --   --   --   HGB 12.6* 11.6* 12.3* 12.1*  HCT 41.7 37.7* 39.9 38.6*  MCV 93.1 92.6 90.7 88.9  PLT 369 333 343 578    Basic Metabolic  Panel: Recent Labs  Lab 12/20/20 0259 12/25/20 0541  NA 144 142  K 3.5 3.6  CL 109 109  CO2 25 26  GLUCOSE 118* 107*  BUN 17 10  CREATININE 0.83 0.78  CALCIUM 8.2* 7.9*     Liver Function Tests: Recent Labs  Lab 12/20/20 0259  AST 45*  ALT 36  ALKPHOS 76  BILITOT 0.7  PROT 5.6*  ALBUMIN 2.5*     Antibiotics: Anti-infectives (From admission, onward)   Start     Dose/Rate Route Frequency Ordered Stop   12/25/20 1700  ceFEPIme (MAXIPIME) 2 g in sodium chloride 0.9 % 100 mL IVPB        2 g 200 mL/hr over 30 Minutes Intravenous Every 12 hours 12/25/20 1627     12/25/20 1700  vancomycin (VANCOCIN) IVPB 1000 mg/200 mL premix        1,000 mg 200 mL/hr over 60 Minutes Intravenous Every 24 hours 12/25/20 1627     12/21/20 1400  azithromycin (ZITHROMAX) tablet 500 mg  Status:  Discontinued        500 mg Oral Daily 12/21/20 1150 12/22/20 1221   12/19/20 1100  cefTRIAXone (ROCEPHIN) 1 g in sodium chloride 0.9 % 100 mL IVPB  Status:  Discontinued        1 g 200 mL/hr over 30 Minutes Intravenous Every 24 hours 12/18/20 1106 12/18/20 1108   12/18/20 1500  azithromycin (ZITHROMAX) 500 mg in sodium chloride 0.9 % 250 mL IVPB  Status:  Discontinued        500 mg 250 mL/hr over 60 Minutes Intravenous Every 24 hours 12/18/20 1106 12/21/20 1150   12/18/20 1200  cefTRIAXone (ROCEPHIN) 1 g in sodium chloride 0.9 % 100 mL IVPB        1 g 200 mL/hr over 30 Minutes Intravenous Every 24 hours 12/18/20 1108 12/24/20 1910   12/17/20 1445  azithromycin (ZITHROMAX) 500 mg in sodium chloride 0.9 % 250 mL IVPB        500 mg 250 mL/hr over 60 Minutes Intravenous  Once 12/17/20 1430 12/17/20 1840   12/17/20 1030  cefTRIAXone (ROCEPHIN) 1 g in sodium chloride 0.9 % 100 mL IVPB        1 g 200 mL/hr over 30 Minutes Intravenous  Once 12/17/20 1020 12/17/20 1305       DVT prophylaxis: Eliquis  Code Status: DNR  Family Communication: Discussed with patient's son at  bedside.   Consultants:  Palliative care  Cardiology  Procedures:  Thoracentesis    Objective   Vitals:   12/26/20 0800 12/26/20 1112 12/26/20 1134 12/26/20 1343  BP: 131/82  124/78 138/76  Pulse: (!) 120  79 (!) 119  Resp:   14 (!) 25  Temp: 98.1 F (36.7 C)   97.9 F (36.6 C)  TempSrc: Oral   Oral  SpO2: 94%  95% 94%  Weight:  62.7 kg    Height:        Intake/Output Summary (Last 24 hours) at 12/26/2020 1411 Last data filed at 12/26/2020 1000 Gross per 24 hour  Intake 2527.81  ml  Output 1700 ml  Net 827.81 ml    01/23 1901 - 01/25 0700 In: 2311.2 [P.O.:600; I.V.:1411.2] Out: 1100 [Urine:1100]  Filed Weights   12/17/20 2323 12/19/20 0635 12/26/20 1112  Weight: 63.5 kg 57.7 kg 62.7 kg    Physical Examination:   General-appears in no acute distress Heart-S1-S2, regular, no murmur auscultated Lungs-clear to auscultation bilaterally, no wheezing or crackles auscultated Abdomen-soft, nontender, no organomegaly Extremities-no edema in the lower extremities Neuro-alert, oriented x3, no focal deficit noted  Status is: Inpatient  Dispo: The patient is from: Skilled nursing facility              Anticipated d/c is to: Skilled nursing facility              Anticipated d/c date is: 12/28/2020              Patient currently not medically stable for discharge  Barrier to discharge-       Data Reviewed:   Recent Results (from the past 240 hour(s))  Culture, blood (routine x 2)     Status: None   Collection Time: 12/17/20 10:20 AM   Specimen: BLOOD  Result Value Ref Range Status   Specimen Description   Final    BLOOD BLOOD RIGHT FOREARM Performed at [facility], [address] [place], [place] [address]    Special Requests   Final    BOTTLES DRAWN AEROBIC AND ANAEROBIC Blood Culture adequate volume Performed at [facility], [address], [place], [place] [address]    Culture   Final    NO GROWTH 5  DAYS Performed at [facility], [address] [place], [place] [address]    Report Status 12/22/2020 FINAL  Final  Culture, blood (routine x 2)     Status: None   Collection Time: 12/17/20 10:25 AM   Specimen: BLOOD  Result Value Ref Range Status   Specimen Description   Final    BLOOD BLOOD LEFT FOREARM Performed at [facility], [address] [place], [place] [address]    Special Requests   Final    BOTTLES DRAWN AEROBIC AND ANAEROBIC Blood Culture results may not be optimal due to an inadequate volume of blood received in culture bottles Performed at [facility], [address], [place], [place] [address]    Culture   Final    NO GROWTH 5 DAYS Performed at [facility], [address] [place], [place] [address]    Report Status 12/22/2020 FINAL  Final  SARS CORONAVIRUS 2 (TAT 6-24 HRS) Nasopharyngeal Nasopharyngeal Swab     Status: None   Collection Time: 12/17/20  3:02 PM   Specimen: Nasopharyngeal Swab  Result Value Ref Range Status   SARS Coronavirus 2 NEGATIVE NEGATIVE Final    Comment: (NOTE) SARS-CoV-2 target nucleic acids are NOT DETECTED.  The SARS-CoV-2 RNA is generally detectable in upper and lower respiratory specimens during the acute phase of infection. Negative results do not preclude SARS-CoV-2 infection, do not rule out co-infections with other pathogens, and should not be used as the sole basis for treatment or other patient management decisions. Negative results must be combined with clinical observations, patient history, and epidemiological information. The expected result is Negative.  Fact Sheet for Patients: HairSlick.no  Fact Sheet for Healthcare Providers: quierodirigir.com  This test is not yet approved or cleared by the [place] FDA and  has been authorized for detection  and/or diagnosis of SARS-CoV-2 by FDA under an Emergency  Use Authorization (EUA). This EUA will remain  in effect (meaning this test can be used) for the duration of the COVID-19 declaration under Se ction 564(b)(1) of the Act, 21 U.S.C. section 360bbb-3(b)(1), unless the authorization is terminated or revoked sooner.  Performed at [facility], [place] [address], [place], [place] [address]   Body fluid culture     Status: None   Collection Time: 12/18/20  1:27 PM   Specimen: PATH Cytology Pleural fluid  Result Value Ref Range Status   Specimen Description   Final    PLEURAL Performed at [facility] [address] [place], [place] [address]    Special Requests   Final    NONE Performed at [facility], [place] [address], [place], [place] [address]    Gram Stain NO WBC SEEN NO ORGANISMS SEEN   Final   Culture   Final    NO GROWTH 3 DAYS Performed at [facility], [place] [address] [place], [place] [address]    Report Status 12/22/2020 FINAL  Final  MRSA PCR Screening     Status: Abnormal   Collection Time: 12/19/20  6:23 AM   Specimen: Nasopharyngeal  Result Value Ref Range Status   MRSA by PCR POSITIVE (A) NEGATIVE Final    Comment:        The GeneXpert MRSA Assay (FDA approved for NASAL specimens only), is one component of a comprehensive MRSA colonization surveillance program. It is not intended to diagnose MRSA infection nor to guide or monitor treatment for MRSA infections. RESULT CALLED TO, READ BACK BY AND VERIFIED WITH: CODLE,L. RN @1315  ON 01.18.2022 BY COHEN,K Performed at [facility], [place] [address], [place], [place] [address]        Oswald Hillock   [facility] If 7PM-7AM, please contact night-coverage at [email or web address], Office  [phone]   12/26/2020, 2:11 PM  LOS: 9 days

## 2020-12-26 NOTE — Consult Note (Signed)
NAME:  Casey Gates., MRN:  [id number], DOB:  18-Sep-1926, LOS: 9 ADMISSION DATE:  12/17/2020, CONSULTATION DATE: 1/24 REFERRING MD:  Darrick Meigs , CHIEF COMPLAINT:  Exudative pleural effusion    Brief History:  87 year old male initially admitted on 1/16 healthcare associated pneumonia and associated right-sided loculated pleural effusion.  Completed broad-spectrum antibiotic course and in spite of appropriate therapy developed worsening loculated right pleural effusion and evolution of air/gas fluid level.  Critical care asked to evaluate  History of Present Illness:  87 year old male with history as mentioned below was admitted on 1/16 with altered mental status.  History initially obtained from the patient's son.  Patient lives at a nursing facility was found to be altered.  Initially nursing facility felt UTI.  As his mental status continued to deteriorate he was eventually brought to the emergency room for evaluation on 1/16.  In the ER he was found to be tachycardic, afebrile, initial white blood cell count 22.9 chest x-ray with right lower lobe vague airspace disease.  A subsequent CT of chest was later obtained demonstrating moderate sized loculated right pleural effusion, left lower lobe airspace disease, right lower lobe airspace disease, very small focal groundglass changes.  Was admitted with working diagnosis of healthcare associated pneumonia and pleural effusion and subsequently underwent thoracentesis on 1/17.  Per lights criteria pleural fluid was consistent with exudative process however no organism was speciated.  He was treated supportively with antibiotics IV hydration and supplemental oxygen.  His hospital course complicated by atrial fibrillation with RVR and ongoing tachycardia, he is since completed antibiotics as of 1/24 and in spite of this follow-up chest x-ray now shows significant right greater than left airspace disease which has had marked progression since prior film on the  17th.  Prior recommendation a repeat CT of chest was obtained this demonstrated a fairly large complicated right pleural effusion with several loculations as well as new formation of gas/air-fluid level in the posterior component.  Because of the radiographic decline critical care was asked to evaluate. Past Medical History:  Obstructive sleep apnea, atrial fibrillation on chronic anticoagulation, chronic pain syndrome. Significant Hospital Events:  1/16 admitted, started on broad-spectrum antibiotics 1/ 17: Exudative pleural effusion by thoracentesis, 225 cc of hazy amber fluid removed 1/18 cardiology consulted for atrial fibrillation with RVR 1/19 azithromycin completed 1/23: Ceftriaxone completed 1/24: Pulmonary asked to evaluate given worsening loculated right pleural effusion  Consults:    Procedures:  thora 1/17   - LDH 1318, 97% polys  Significant Diagnostic Tests:  CT chest 1/24: 1. Moderate to large loculated right pleural effusion is increased in volume when compared with 12/17/2020. New focus of gas with air-fluid level is noted within the posterior component of the effusion. 2. Progressive atelectasis of the entire right lower lobe with partial atelectasis of the right middle lobe and right upper lobe. 3. Progressive consolidation involving the posteromedial left lower lobe containing air bronchograms. 4. Similar appearance of patchy areas of ground-glass and airspace density within the anterior left upper lobe. Likely post infectious or inflammatory in etiology. 5. Aortic atherosclerosis. Coronary artery calcifications.  Micro Data:    Antimicrobials:   1/16 azith X 5 1/16  -> 1/23 1/24 vanc and cefepime>>> Interim History / Subjective:    12/26/2020 - Today by IR -> Technically successful CT guided placed of a 14 Fr drainage catheter placement into the basilar aspect of the right pleural space yielding 20 cc of blood tinged fluid.  An aspirated sample was  capped  and sent to the laboratory for analysis.    He says he feels fine and had dinner just now  Has A Fib RVR and on po cardizem per RN  Objective   Blood pressure 139/88, pulse (!) 116, temperature 98.1 F (36.7 C), temperature source Oral, resp. rate (!) 22, height 5\' 11"  (1.803 m), weight 62.7 kg, SpO2 99 %.        Intake/Output Summary (Last 24 hours) at 12/26/2020 1902 Last data filed at 12/26/2020 1800 Gross per 24 hour  Intake 1997.03 ml  Output 2000 ml  Net -2.97 ml   Filed Weights   12/17/20 2323 12/19/20 0635 12/26/20 1112  Weight: 63.5 kg 57.7 kg 62.7 kg    Frailm ale No distress A,xOx3 Mild tachypnea Rt chest tube + with some serous brown drainage Is wheezing A Fib RVR + Abd soft    Resolved Hospital Problem list     Assessment & Plan:   Rt sided loculated effusion s/p chest tube by IR 12/26/20  12/26/2020- minimal drainage  only  Plan  - aim for fibrinolysis 12/27/20 - get cxr 12/6/222  Wheezing - likely asthma v d-chf  Plan  start BD and reassess   Best practice (evaluated daily)  Per primary  SIGNATURE    Dr. Brand Males, M.D., F.C.C.P,  Pulmonary and Critical Care Medicine Staff Physician, [place] Director - Interstitial Lung Disease  Program  Pulmonary [place] [facility] [place], [place], [address]  Pager: [phone], If no answer  OR between  19:00-7:00h: page [phone]  [phone] Telephone (clinical office): [phone] Telephone (research): [phone]  7:02 PM 12/26/2020    LABS    PULMONARY Recent Labs  Lab 12/20/20 0259  HCO3 26.6  O2SAT 61.1    CBC Recent Labs  Lab 12/22/20 0528 12/23/20 0059 12/24/20 0545  HGB 11.6* 12.3* 12.1*  HCT 37.7* 39.9 38.6*  WBC 21.2* 16.4* 18.6*  PLT 333 343 359    COAGULATION No results for input(s): INR in the last 168 hours.  CARDIAC  No results for input(s): TROPONINI in the last 168 hours. No results for  input(s): PROBNP in the last 168 hours.   CHEMISTRY Recent Labs  Lab 12/20/20 0259 12/25/20 0541  NA 144 142  K 3.5 3.6  CL 109 109  CO2 25 26  GLUCOSE 118* 107*  BUN 17 10  CREATININE 0.83 0.78  CALCIUM 8.2* 7.9*   Estimated Creatinine Clearance: 52.3 mL/min (by C-G formula based on SCr of 0.78 mg/dL).   LIVER Recent Labs  Lab 12/20/20 0259  AST 45*  ALT 36  ALKPHOS 76  BILITOT 0.7  PROT 5.6*  ALBUMIN 2.5*     INFECTIOUS No results for input(s): LATICACIDVEN, PROCALCITON in the last 168 hours.   ENDOCRINE CBG (last 3)  No results for input(s): GLUCAP in the last 72 hours.       IMAGING x48h  - image(s) personally visualized  -   highlighted in bold CT CHEST WO CONTRAST  Result Date: 12/25/2020 CLINICAL DATA:  Evaluate for effusion or abscess. EXAM: CT CHEST WITHOUT CONTRAST TECHNIQUE: Multidetector CT imaging of the chest was performed following the standard protocol without IV contrast. COMPARISON:  12/17/2020 . FINDINGS: Cardiovascular: Cardiac enlargement, unchanged. Aortic atherosclerosis identified. Coronary artery atherosclerotic calcifications. No pericardial effusion. Mediastinum/Nodes: Thyroid gland is unremarkable. The trachea appears patent and is midline. Normal appearance of the esophagus. No enlarged  axillary, supraclavicular, or mediastinal lymph nodes. Hilar lymph nodes are suboptimally evaluated due to lack of IV contrast material. Lungs/Pleura: Moderate to large loculated right pleural effusion appears increased in volume when compared with 12/17/2020. Fluid is now extending over the right lung apex. Gas with air-fluid level is noted within the posterior component which is new from previous exam, image 106/2. There is atelectasis of the entire right lower lobe with partial atelectasis of the right middle lobe and right upper lobe. Progressive consolidation involving the posteromedial left lower lobe containing air bronchograms. Trace left pleural  fluid. Patchy atelectasis is identified within the superior segment of left lower lobe. Several persistent patchy areas of ground-glass and airspace density noted within the anterior left upper lobe, similar to previous exam. Upper Abdomen: No acute abnormality. Previous cholecystectomy. Extensive aortic atherosclerosis with branch vessel disease. Trace amount of perihepatic ascites is new from previous exam. Musculoskeletal: Degenerative disc disease is identified within the thoracic and lumbar spine. Thoracolumbar scoliosis. IMPRESSION: 1. Moderate to large loculated right pleural effusion is increased in volume when compared with 12/17/2020. New focus of gas with air-fluid level is noted within the posterior component of the effusion. 2. Progressive atelectasis of the entire right lower lobe with partial atelectasis of the right middle lobe and right upper lobe. 3. Progressive consolidation involving the posteromedial left lower lobe containing air bronchograms. 4. Similar appearance of patchy areas of ground-glass and airspace density within the anterior left upper lobe. Likely post infectious or inflammatory in etiology. 5. Aortic atherosclerosis. Coronary artery calcifications. Aortic Atherosclerosis (ICD10-I70.0). Electronically Signed   By: Kerby Moors M.D.   On: 12/25/2020 11:18   DG Chest Port 1V same Day  Result Date: 12/25/2020 CLINICAL DATA:  Pneumonia EXAM: PORTABLE CHEST 1 VIEW COMPARISON:  12/18/2020 FINDINGS: Dense right upper lobe opacity where there was loculated pleural fluid by prior CT and now the shape of a right upper lobe collapse. Increased dense right lower lobe opacity. Continued retrocardiac airspace disease. Normal heart size. IMPRESSION: 1. Significant worsening of right-sided aeration from increasing loculated pleural fluid. There is likely superimposed right upper lobe collapse. 2. Continued retrocardiac infiltrate. Electronically Signed   By: Monte Fantasia M.D.   On:  12/25/2020 04:16

## 2020-12-27 ENCOUNTER — Inpatient Hospital Stay (HOSPITAL_COMMUNITY): Payer: Medicare PPO

## 2020-12-27 DIAGNOSIS — J90 Pleural effusion, not elsewhere classified: Secondary | ICD-10-CM

## 2020-12-27 DIAGNOSIS — J869 Pyothorax without fistula: Secondary | ICD-10-CM | POA: Diagnosis present

## 2020-12-27 DIAGNOSIS — J189 Pneumonia, unspecified organism: Secondary | ICD-10-CM | POA: Diagnosis not present

## 2020-12-27 DIAGNOSIS — I4891 Unspecified atrial fibrillation: Secondary | ICD-10-CM | POA: Diagnosis not present

## 2020-12-27 DIAGNOSIS — G9341 Metabolic encephalopathy: Secondary | ICD-10-CM | POA: Diagnosis not present

## 2020-12-27 LAB — BASIC METABOLIC PANEL
Anion gap: 12 (ref 5–15)
BUN: 14 mg/dL (ref 8–23)
CO2: 24 mmol/L (ref 22–32)
Calcium: 7.8 mg/dL — ABNORMAL LOW (ref 8.9–10.3)
Chloride: 104 mmol/L (ref 98–111)
Creatinine, Ser: 0.69 mg/dL (ref 0.61–1.24)
GFR, Estimated: >60 mL/min (ref >60)
Glucose, Bld: 92 mg/dL (ref 70–99)
Potassium: 4.2 mmol/L (ref 3.5–5.1)
Sodium: 140 mmol/L (ref 135–145)

## 2020-12-27 LAB — CBC
HCT: 37.9 % — ABNORMAL LOW (ref 39.0–52.0)
Hemoglobin: 12.2 g/dL — ABNORMAL LOW (ref 13.0–17.0)
MCH: 28.2 pg (ref 26.0–34.0)
MCHC: 32.2 g/dL (ref 30.0–36.0)
MCV: 87.7 fL (ref 80.0–100.0)
Platelets: 369 10*3/uL (ref 150–400)
RBC: 4.32 MIL/uL (ref 4.22–5.81)
RDW: 14.3 % (ref 11.5–15.5)
WBC: 14.7 10*3/uL — ABNORMAL HIGH (ref 4.0–10.5)
nRBC: 0 % (ref 0.0–0.2)

## 2020-12-27 LAB — PHOSPHORUS: Phosphorus: 3.3 mg/dL (ref 2.5–4.6)

## 2020-12-27 LAB — MAGNESIUM: Magnesium: 2 mg/dL (ref 1.7–2.4)

## 2020-12-27 MED ORDER — METOPROLOL TARTRATE 5 MG/5ML IV SOLN
2.5000 mg | Freq: Once | INTRAVENOUS | Status: AC
  Administered 2020-12-27: 2.5 mg via INTRAVENOUS
  Filled 2020-12-27: qty 5

## 2020-12-27 MED ORDER — DILTIAZEM HCL 90 MG PO TABS
90.0000 mg | ORAL_TABLET | Freq: Four times a day (QID) | ORAL | Status: DC
  Administered 2020-12-27: 90 mg via ORAL
  Filled 2020-12-27: qty 1

## 2020-12-27 MED ORDER — SODIUM CHLORIDE (PF) 0.9 % IJ SOLN
10.0000 mg | Freq: Once | INTRAMUSCULAR | Status: AC
  Filled 2020-12-27: qty 10

## 2020-12-27 MED ORDER — DILTIAZEM HCL 60 MG PO TABS
90.0000 mg | ORAL_TABLET | Freq: Four times a day (QID) | ORAL | Status: DC
  Administered 2020-12-27 – 2020-12-28 (×2): 90 mg via ORAL
  Filled 2020-12-27 (×2): qty 1

## 2020-12-27 MED ORDER — METOPROLOL TARTRATE 12.5 MG HALF TABLET
12.5000 mg | ORAL_TABLET | Freq: Two times a day (BID) | ORAL | Status: DC
  Administered 2020-12-28: 12.5 mg via ORAL
  Filled 2020-12-27 (×2): qty 1

## 2020-12-27 MED ORDER — STERILE WATER FOR INJECTION IJ SOLN
5.0000 mg | Freq: Once | RESPIRATORY_TRACT | Status: AC
  Filled 2020-12-27: qty 5

## 2020-12-27 NOTE — Progress Notes (Signed)
Occupational Therapy Treatment Patient Details Name: Casey Gates. MRN: [id number] DOB: 10-Jun-1926 Today's Date: 12/27/2020    History of present illness Patient is a 87 year old male PMH of A. fib, CKD, chronic pain, OSA (does not use BiPAP) who presented to the hospital 12/17/2020 by son for altered mental status. Per chart had COVID end of Dec 2021 Patient s/p CT placement 1/26.    OT comments  Treatment focused on improving activity tolerance. Patient mod assist to transfer to side of bed and patient performed oral care with set up. Patient more alert and participatory today. Patient reports pain at chest tube site. Patient's HR elevated into 130s with minimal activity. Patient min assist to return to supine. Patient tolerating more today. Continue to recommend short term rehab at discharge.   Follow Up Recommendations  SNF    Equipment Recommendations  None recommended by OT    Recommendations for Other Services      Precautions / Restrictions Precautions Precautions: Fall Precaution Comments: aspiration, chest tube right side Restrictions Weight Bearing Restrictions: No       Mobility Bed Mobility Overal bed mobility: Needs Assistance Bed Mobility: Supine to Sit     Supine to sit: HOB elevated;Mod assist     General bed mobility comments: Patient able to manage LEs to edge of bed needed assistance with trunk negotiation. Patient able to scoot to edge of bed.  Transfers                      Balance Overall balance assessment: Needs assistance Sitting-balance support: Feet supported Sitting balance-Leahy Scale: Fair Sitting balance - Comments: min guard at side of bed                                   ADL either performed or assessed with clinical judgement   ADL       Grooming: Set up;Sitting;Oral care Grooming Details (indicate cue type and reason): Patient brushed teeth sitting at side of bed with therapist providing set up,  holding basin and with min guard.             Lower Body Dressing: Total assistance;Bed level Lower Body Dressing Details (indicate cue type and reason): to don socks                     Vision Patient Visual Report: No change from baseline Vision Assessment?: No apparent visual deficits   Perception     Praxis      Cognition Arousal/Alertness: Awake/alert Behavior During Therapy: WFL for tasks assessed/performed Overall Cognitive Status: Within Functional Limits for tasks assessed                                          Exercises     Shoulder Instructions       General Comments      Pertinent Vitals/ Pain       Pain Assessment: Faces Faces Pain Scale: Hurts little more Pain Location: Rt side (CT site) Pain Descriptors / Indicators: Grimacing;Guarding Pain Intervention(s): Limited activity within patient's tolerance;Monitored during session  Home Living  Prior Functioning/Environment              Frequency  Min 2X/week        Progress Toward Goals  OT Goals(current goals can now be found in the care plan section)  Progress towards OT goals: Progressing toward goals  Acute Rehab OT Goals Patient Stated Goal: To walk and return to apartment OT Goal Formulation: With patient Time For Goal Achievement: 01/02/21 Potential to Achieve Goals: [place] Discharge plan remains appropriate    Co-evaluation          OT goals addressed during session: ADL's and self-care (functional mobility, activity tolerance)      AM-PAC OT "6 Clicks" Daily Activity     Outcome Measure   Help from another person eating meals?: A Little Help from another person taking care of personal grooming?: A Little Help from another person toileting, which includes using toliet, bedpan, or urinal?: Total Help from another person bathing (including washing, rinsing, drying)?: Total Help  from another person to put on and taking off regular upper body clothing?: A Lot Help from another person to put on and taking off regular lower body clothing?: Total 6 Click Score: 11    End of Session    OT Visit Diagnosis: Other abnormalities of gait and mobility (R26.89);Muscle weakness (generalized) (M62.81);Other symptoms and signs involving cognitive function   Activity Tolerance Patient limited by fatigue;Patient limited by pain   Patient Left in bed;with call bell/phone within reach;with bed alarm set;with family/visitor present   Nurse Communication Mobility status        Time: 1431-1444 OT Time Calculation (min): 13 min  Charges: OT General Charges $OT Visit: 1 Visit OT Treatments $Self Care/Home Management : 8-22 mins  Derl Barrow, OTR/L [place]  Office [phone] Pager: [place] 12/27/2020, 2:57 PM

## 2020-12-27 NOTE — Progress Notes (Signed)
NAME:  Casey Saldarriaga., MRN:  Casey Gates, DOB:  Sep 28, 1926, LOS: 10 ADMISSION DATE:  12/17/2020, CONSULTATION DATE: 1/24 REFERRING MD:  Darrick Meigs , CHIEF COMPLAINT:  Exudative pleural effusion    Brief History:  87 year old male initially admitted on 1/16 healthcare associated pneumonia and associated right-sided loculated pleural effusion.  Completed broad-spectrum antibiotic course and in spite of appropriate therapy developed worsening loculated right pleural effusion and evolution of air/gas fluid level.  Critical care asked to evaluate  History of Present Illness:  87 year old male with history as mentioned below was admitted on 1/16 with altered mental status.  History initially obtained from the patient's son.  Patient lives at a nursing facility was found to be altered.  Initially nursing facility felt UTI.  As his mental status continued to deteriorate he was eventually brought to the emergency room for evaluation on 1/16.  In the ER he was found to be tachycardic, afebrile, initial white blood cell count 22.9 chest x-ray with right lower lobe vague airspace disease.  A subsequent CT of chest was later obtained demonstrating moderate sized loculated right pleural effusion, left lower lobe airspace disease, right lower lobe airspace disease, very small focal groundglass changes.  Was admitted with working diagnosis of healthcare associated pneumonia and pleural effusion and subsequently underwent thoracentesis on 1/17.  Per lights criteria pleural fluid was consistent with exudative process however no organism was speciated.  He was treated supportively with antibiotics IV hydration and supplemental oxygen.  His hospital course complicated by atrial fibrillation with RVR and ongoing tachycardia, he is since completed antibiotics as of 1/24 and in spite of this follow-up chest x-ray now shows significant right greater than left airspace disease which has had marked progression since prior film on the  17th.  Prior recommendation a repeat CT of chest was obtained this demonstrated a fairly large complicated right pleural effusion with several loculations as well as new formation of gas/air-fluid level in the posterior component.  Because of the radiographic decline critical care was asked to evaluate. Past Medical History:  Obstructive sleep apnea, atrial fibrillation on chronic anticoagulation, chronic pain syndrome. Significant Hospital Events:  1/16 admitted, started on broad-spectrum antibiotics 1/ 17: Exudative pleural effusion by thoracentesis, 225 cc of hazy amber fluid removed 1/18 cardiology consulted for atrial fibrillation with RVR 1/19 azithromycin completed 1/23: Ceftriaxone completed 1/24: Pulmonary asked to evaluate given worsening loculated right pleural effusion 12/27/2020 right chest tube with Cathflo Activase 10 mg Pulmozyme 5 mg instilled. Consults:    Procedures:  thora 1/17   - LDH 1318, 97% polys  Significant Diagnostic Tests:  CT chest 1/24: 1. Moderate to large loculated right pleural effusion is increased in volume when compared with 12/17/2020. New focus of gas with air-fluid level is noted within the posterior component of the effusion. 2. Progressive atelectasis of the entire right lower lobe with partial atelectasis of the right middle lobe and right upper lobe. 3. Progressive consolidation involving the posteromedial left lower lobe containing air bronchograms. 4. Similar appearance of patchy areas of ground-glass and airspace density within the anterior left upper lobe. Likely post infectious or inflammatory in etiology. 5. Aortic atherosclerosis. Coronary artery calcifications.  Micro Data:    Antimicrobials:   1/16 azith X 5 1/16  -> 1/23 1/24 vanc and cefepime>>> Interim History / Subjective:    Awake alert no acute distress eating  Objective   Blood pressure 112/63, pulse 91, temperature 98.3 F (36.8 C), temperature source Oral, resp.  rate Marland Kitchen)  29, height 5\' 11"  (1.803 m), weight 62.7 kg, SpO2 96 %.        Intake/Output Summary (Last 24 hours) at 12/27/2020 0937 Last data filed at 12/27/2020 0800 Gross per 24 hour  Intake 1924.92 ml  Output 1280 ml  Net 644.92 ml   Filed Weights   12/17/20 2323 12/19/20 0635 12/26/20 1112  Weight: 63.5 kg 57.7 kg 62.7 kg   Exam:  Awake alert and eating No acute distress at rest. Neurologically somewhat confused extremely hard of hearing No JVD or lymphadenopathy is appreciated Chest with decreased breath sounds bilaterally faint expiratory wheeze right chest tube showed approximately 90 cc of serous drainage Cardiac heart sounds are distant Abdomen soft nontender Lower extremities with vascular changes without edema. Resolved Hospital Problem list     Assessment & Plan:   Rt sided loculated effusion s/p chest tube by IR 12/26/20  12/27/2020- minimal drainage  only  Plan Instill fibrinolytics 12/27/2020 Monitor chest tube output Question repeat in a.m.   Wheezing - likely asthma v d-chf  Plan Continue bronchodilators   Best practice (evaluated daily)  Per primary  [facility] Housekeeper Shy Guallpa ACNP Acute Care Nurse Practitioner [place] Please consult Amion 12/27/2020, 9:38 AM    LABS    PULMONARY No results for input(s): PHART, PCO2ART, PO2ART, HCO3, TCO2, O2SAT in the last 168 hours.  Invalid input(s): PCO2, PO2  CBC Recent Labs  Lab 12/23/20 0059 12/24/20 0545 12/27/20 0548  HGB 12.3* 12.1* 12.2*  HCT 39.9 38.6* 37.9*  WBC 16.4* 18.6* 14.7*  PLT 343 359 369    COAGULATION No results for input(s): INR in the last 168 hours.  CARDIAC  No results for input(s): TROPONINI in the last 168 hours. No results for input(s): PROBNP in the last 168 hours.   CHEMISTRY Recent Labs  Lab 12/25/20 0541 12/27/20 0548  NA 142 140  K 3.6 4.2  CL 109 104  CO2 26 24  GLUCOSE 107* 92  BUN 10 14  CREATININE 0.78 0.69  CALCIUM  7.9* 7.8*  MG  --  2.0  PHOS  --  3.3   Estimated Creatinine Clearance: 52.3 mL/min (by C-G formula based on SCr of 0.69 mg/dL).   LIVER No results for input(s): AST, ALT, ALKPHOS, BILITOT, PROT, ALBUMIN, INR in the last 168 hours.   INFECTIOUS No results for input(s): LATICACIDVEN, PROCALCITON in the last 168 hours.   ENDOCRINE CBG (last 3)  No results for input(s): GLUCAP in the last 72 hours.       IMAGING x48h  - image(s) personally visualized  -   highlighted in bold CT CHEST WO CONTRAST  Result Date: 12/25/2020 CLINICAL DATA:  Evaluate for effusion or abscess. EXAM: CT CHEST WITHOUT CONTRAST TECHNIQUE: Multidetector CT imaging of the chest was performed following the standard protocol without IV contrast. COMPARISON:  12/17/2020 . FINDINGS: Cardiovascular: Cardiac enlargement, unchanged. Aortic atherosclerosis identified. Coronary artery atherosclerotic calcifications. No pericardial effusion. Mediastinum/Nodes: Thyroid gland is unremarkable. The trachea appears patent and is midline. Normal appearance of the esophagus. No enlarged axillary, supraclavicular, or mediastinal lymph nodes. Hilar lymph nodes are suboptimally evaluated due to lack of IV contrast material. Lungs/Pleura: Moderate to large loculated right pleural effusion appears increased in volume when compared with 12/17/2020. Fluid is now extending over the right lung apex. Gas with air-fluid level is noted within the posterior component which is new from previous exam, image 106/2. There is atelectasis of the entire right lower lobe with  partial atelectasis of the right middle lobe and right upper lobe. Progressive consolidation involving the posteromedial left lower lobe containing air bronchograms. Trace left pleural fluid. Patchy atelectasis is identified within the superior segment of left lower lobe. Several persistent patchy areas of ground-glass and airspace density noted within the anterior left upper lobe,  similar to previous exam. Upper Abdomen: No acute abnormality. Previous cholecystectomy. Extensive aortic atherosclerosis with branch vessel disease. Trace amount of perihepatic ascites is new from previous exam. Musculoskeletal: Degenerative disc disease is identified within the thoracic and lumbar spine. Thoracolumbar scoliosis. IMPRESSION: 1. Moderate to large loculated right pleural effusion is increased in volume when compared with 12/17/2020. New focus of gas with air-fluid level is noted within the posterior component of the effusion. 2. Progressive atelectasis of the entire right lower lobe with partial atelectasis of the right middle lobe and right upper lobe. 3. Progressive consolidation involving the posteromedial left lower lobe containing air bronchograms. 4. Similar appearance of patchy areas of ground-glass and airspace density within the anterior left upper lobe. Likely post infectious or inflammatory in etiology. 5. Aortic atherosclerosis. Coronary artery calcifications. Aortic Atherosclerosis (ICD10-I70.0). Electronically Signed   By: Kerby Moors M.D.   On: 12/25/2020 11:18   DG CHEST PORT 1 VIEW  Result Date: 12/27/2020 CLINICAL DATA:  [place]. EXAM: PORTABLE CHEST 1 VIEW COMPARISON:  CT 12/25/2020. FINDINGS: Chest tube noted over right lower chest. Right pleural effusion/empyema again noted, decreased in size from prior exam. Persistent bibasilar atelectasis/infiltrates. Stable cardiomegaly. IMPRESSION: 1. Chest tube noted over the right lower chest. Right pleural effusion/empyema again noted, decreased in size from prior exam. 2.  Persistent bibasilar atelectasis/infiltrates. 3.  Stable cardiomegaly. Electronically Signed   By: Marcello Moores  Register   On: 12/27/2020 05:59   CT IMAGE GUIDED FLUID DRAIN BY CATHETER  Result Date: 12/27/2020 INDICATION: Loculated right-sided pleural effusion. Please perform CT-guided chest tube placement for infection source purposes and potentially for the  administration intrapleural tPA. EXAM: CT IMAGE GUIDED FLUID DRAIN BY CATHETER COMPARISON:  None. MEDICATIONS: The patient is currently admitted to the hospital and receiving intravenous antibiotics. The antibiotics were administered within an appropriate time frame prior to the initiation of the procedure. ANESTHESIA/SEDATION: None CONTRAST:  None COMPLICATIONS: None immediate. PROCEDURE: Informed written consent was obtained from the patient after a discussion of the risks, benefits and alternatives to treatment. The patient was placed supine, slightly RPO on the CT gantry and a pre procedural CT was performed re-demonstrating the known complex partially loculated right-sided pleural effusion. An access site along the inferior lateral aspect of the right chest wall was marked and the procedure was planned. A timeout was performed prior to the initiation of the procedure. The skin overlying the inferior aspect the right lateral chest wall was prepped and draped in the usual sterile fashion. The overlying soft tissues were anesthetized with 1% lidocaine with epinephrine. Appropriate trajectory was planned with the use of a 22 gauge spinal needle. An 18 gauge trocar needle was advanced into the abscess/fluid collection and a short Amplatz super stiff wire was coiled within the collection. Appropriate positioning was confirmed with a limited CT scan. The tract was serially dilated allowing placement of a 14 French all-purpose drainage catheter. Appropriate positioning was confirmed with a limited postprocedural CT scan. Approximately 20 cc of blood tinged serous pleural fluid was aspirated. The tube was connected to a pleura vac device and sutured in place. A dressing was placed. The patient tolerated the procedure well without immediate post  procedural complication. IMPRESSION: Successful CT guided placement of a 30 French all purpose drain catheter into the basilar aspect of the right pleural space with aspiration  of 20 cc of blood tinged serous pleural fluid. Samples were sent to the laboratory as requested by the ordering clinical team. Electronically Signed   By: Sandi Mariscal M.D.   On: 12/27/2020 08:23

## 2020-12-27 NOTE — Progress Notes (Signed)
PROGRESS NOTE   Casey Gates.  XFG:[id number] DOB: 12-04-1925 DOA: 12/17/2020 PCP: Velna Hatchet, MD  Brief Narrative:  87 year old white male skilled nursing facility resident Prior GI stromal cell tumor and GI bleed/19/19 Chronic right-sided pleural effusion since that time Chronic pain on oxymorphone, restless leg syndrome, prior prostate cancer as well as Thoracic aortic aneurysm documented in the past OSA not on biPAP  Admit 9/67/8938 with metabolic encephalopathy heart rate 138 A. fib RVR-chest x-ray showed right lower lung opacity Eventually found to have partially loculated right pleural effusion which was exudative per thoracentesis on 1/17Treated with Rocephin and azithromycin Repeat x-rays showed worsening right-sided opacities-pulmonary consulted Underwent IR thoracentesis with chest tube    Assessment & Plan:   Active Problems:   Multifocal pneumonia   Sepsis ([place])   Atrial fibrillation with RVR (H[facility])   Acute metabolic encephalopathy   Hypernatremia   1. Sepsis secondary to left lower lobe pneumonia a. Has been on antibiotics since 1/16 b. Likely will discontinue the same in 14 days total c. Cut back fluid rate to 40 cc an hour 2. Loculated pleural effusion right side a. Chest tube placed 1/25-Pulmozyme placed x1 by pulmonary 1/26 b. Repeat imaging as per them-?  Replace the same going forward 3. A. fib RVR CHADS2 score >4 a. Rate not controlled rates in the 120s b. Increased Cardizem dose to 90 mg every 6 c. May need to add beta-blocker d. Unsure of candidate for anticoagulation given possible high risk bleeding e. Apixaban 2.5 twice daily held-if no further procedures in the next several days can resume 4. Metabolic encephalopathy on admission a. Resolved 5. Prior GI stromal tumor and GI bleed a. Given bleeding in the past possibly a poor candidate for anticoagulation-needs outpatient discussion 6. Chronic pain a. Opana 10 every 6 on  hold b. Continue gabapentin 300 3 times daily 7. OSA not on BiPAP 8. Dysphagia a. On dysphagia 3 diet thin liquids-careful and slow on diet b. Will need outpatient follow-up    DVT prophylaxis: Lovenox Code Status: DNR Family Communication: None bedside Disposition:  Status is: Inpatient  Remains inpatient appropriate because:Hemodynamically unstable, Persistent severe electrolyte disturbances and Ongoing diagnostic testing needed not appropriate for outpatient work up   Dispo: The patient is from: SNF              Anticipated d/c is to: SNF              Anticipated d/c date is: 2 days              Patient currently is not medically stable to d/c.   Difficult to place patient No       Consultants:   Pulmonary  Procedures: None  Antimicrobials: As above   Subjective: Sitting eating lunch-heart rate remains in the 100 range No chest pain no fever No cough no cold Has some discomfort at the site of chest tube   Objective: Vitals:   12/27/20 0400 12/27/20 0534 12/27/20 0800 12/27/20 1100  BP: 108/62 (!) 114/54 112/63 114/68  Pulse: (!) 113  91 100  Resp:   (!) 29 (!) 21  Temp: 98.3 F (36.8 C)  98.3 F (36.8 C) 97.8 F (36.6 C)  TempSrc: Oral  Oral   SpO2: 98%  96% 94%  Weight:      Height:        Intake/Output Summary (Last 24 hours) at 12/27/2020 1207 Last data filed at 12/27/2020 1000 Gross per 24 hour  Intake 1684.23 ml  Output 2130 ml  Net -445.77 ml   Filed Weights   12/17/20 2323 12/19/20 0635 12/26/20 1112  Weight: 63.5 kg 57.7 kg 62.7 kg    Examination: Awake coherent no distress EOMI NCAT No icterus no pallor No JVD A. fib on monitors Chest clear no rales no rhonchi decreased air entry right side Abdomen soft No lower extremity edema Moving all 4 limbs equally   Data Reviewed: I have personally reviewed following labs and imaging studies  Sodium 140 potassium 4.2 BUNs/creatinine 40/0.6 WBC 18.6-->14.7 Hemoglobin 12.2 platelet  369  COVID-19 Labs  No results for input(s): DDIMER, FERRITIN, LDH, CRP in the last 72 hours.  Lab Results  Component Value Date   [place] NEGATIVE 12/17/2020   [place] NEGATIVE 07/27/2020     Radiology Studies: DG CHEST PORT 1 VIEW  Result Date: 12/27/2020 CLINICAL DATA:  [place]. EXAM: PORTABLE CHEST 1 VIEW COMPARISON:  CT 12/25/2020. FINDINGS: Chest tube noted over right lower chest. Right pleural effusion/empyema again noted, decreased in size from prior exam. Persistent bibasilar atelectasis/infiltrates. Stable cardiomegaly. IMPRESSION: 1. Chest tube noted over the right lower chest. Right pleural effusion/empyema again noted, decreased in size from prior exam. 2.  Persistent bibasilar atelectasis/infiltrates. 3.  Stable cardiomegaly. Electronically Signed   By: Marcello Moores  Register   On: 12/27/2020 05:59   CT IMAGE GUIDED FLUID DRAIN BY CATHETER  Result Date: 12/27/2020 INDICATION: Loculated right-sided pleural effusion. Please perform CT-guided chest tube placement for infection source purposes and potentially for the administration intrapleural tPA. EXAM: CT IMAGE GUIDED FLUID DRAIN BY CATHETER COMPARISON:  None. MEDICATIONS: The patient is currently admitted to the hospital and receiving intravenous antibiotics. The antibiotics were administered within an appropriate time frame prior to the initiation of the procedure. ANESTHESIA/SEDATION: None CONTRAST:  None COMPLICATIONS: None immediate. PROCEDURE: Informed written consent was obtained from the patient after a discussion of the risks, benefits and alternatives to treatment. The patient was placed supine, slightly RPO on the CT gantry and a pre procedural CT was performed re-demonstrating the known complex partially loculated right-sided pleural effusion. An access site along the inferior lateral aspect of the right chest wall was marked and the procedure was planned. A timeout was performed prior to the initiation of the  procedure. The skin overlying the inferior aspect the right lateral chest wall was prepped and draped in the usual sterile fashion. The overlying soft tissues were anesthetized with 1% lidocaine with epinephrine. Appropriate trajectory was planned with the use of a 22 gauge spinal needle. An 18 gauge trocar needle was advanced into the abscess/fluid collection and a short Amplatz super stiff wire was coiled within the collection. Appropriate positioning was confirmed with a limited CT scan. The tract was serially dilated allowing placement of a 14 French all-purpose drainage catheter. Appropriate positioning was confirmed with a limited postprocedural CT scan. Approximately 20 cc of blood tinged serous pleural fluid was aspirated. The tube was connected to a pleura vac device and sutured in place. A dressing was placed. The patient tolerated the procedure well without immediate post procedural complication. IMPRESSION: Successful CT guided placement of a 15 French all purpose drain catheter into the basilar aspect of the right pleural space with aspiration of 20 cc of blood tinged serous pleural fluid. Samples were sent to the laboratory as requested by the ordering clinical team. Electronically Signed   By: Sandi Mariscal M.D.   On: 12/27/2020 08:23     Scheduled Meds: . artificial tears  1 application Left Eye TID  . budesonide (PULMICORT) nebulizer solution  0.25 mg Nebulization BID  . Chlorhexidine Gluconate Cloth  6 each Topical Daily  . diltiazem  60 mg Oral Q6H  . feeding supplement  237 mL Oral BID BM  . ipratropium-albuterol  3 mL Nebulization TID  . mirtazapine  30 mg Oral QHS   Continuous Infusions: . ceFEPime (MAXIPIME) IV 2 g (12/27/20 0456)  . dextrose 5 % and 0.45% NaCl 75 mL/hr at 12/27/20 0823  . vancomycin 1,000 mg (12/26/20 1816)     LOS: 10 days    Time spent: [place], MD [facility] To contact the attending provider between 7A-7P or the covering  provider during after hours 7P-7A, please log into the web site [email or web address] and access using universal [place] password for that web site. If you do not have the password, please call the hospital operator.  12/27/2020, 12:07 PM

## 2020-12-27 NOTE — Progress Notes (Signed)
Daily Progress Note   Patient Name: Casey Gates.       Date: 12/27/2020 DOB: Oct 01, 1926  Age: 87 y.o. MRN#: [id number] Attending Physician: Nita [name], MD Primary Care Physician: Velna Hatchet, MD Admit Date: 12/17/2020  Reason for Consultation/Follow-up: Establishing goals of care  Subjective: Patient awake, alert, oriented. Nursing staff at bedside assisting with his bath. Per RN, good oral intake this AM. No pain or discomfort.   GOC:  F/u with son, Casey Gates in hallway. Reviewed plan of care. Casey Gates reports his father's mental status and appetite are greatly improved from the beginning of this hospitalization. Casey Gates does still understand how ill his father in and need for ongoing conversations based on pulmonology recommendations and his response to chest tube and medications. Reassured of ongoing support. PMT contact information given.   Length of Stay: 10  Current Medications: Scheduled Meds:  . alteplase (TPA) for intrapleural administration  10 mg Intrapleural Once   And  . pulmozyme (DORNASE) for intrapleural administration  5 mg Intrapleural Once  . artificial tears  1 application Left Eye TID  . budesonide (PULMICORT) nebulizer solution  0.25 mg Nebulization BID  . Chlorhexidine Gluconate Cloth  6 each Topical Daily  . diltiazem  60 mg Oral Q6H  . feeding supplement  237 mL Oral BID BM  . ipratropium-albuterol  3 mL Nebulization TID  . mirtazapine  30 mg Oral QHS    Continuous Infusions: . ceFEPime (MAXIPIME) IV 2 g (12/27/20 0456)  . dextrose 5 % and 0.45% NaCl 75 mL/hr at 12/27/20 0823  . vancomycin 1,000 mg (12/26/20 1816)    PRN Meds: acetaminophen **OR** acetaminophen, gabapentin, ondansetron **OR** ondansetron (ZOFRAN) IV, Resource ThickenUp  Clear  Physical Exam Vitals and nursing note reviewed.  Constitutional:      General: He is awake.     Appearance: He is ill-appearing.     Comments: Frail, cachectic  Pulmonary:     Effort: No tachypnea, accessory muscle usage or respiratory distress.  Skin:    General: Skin is warm and dry.  Neurological:     Mental Status: He is alert and oriented to person, place, and time.  Psychiatric:        Mood and Affect: Mood normal.        Speech: Speech normal.  Behavior: Behavior normal.        Cognition and Memory: Cognition normal.            Vital Signs: BP 112/63   Pulse 91   Temp 98.3 F (36.8 C) (Oral)   Resp (!) 29   Ht 5\' 11"  (1.803 m)   Wt 62.7 kg   SpO2 96%   BMI 19.28 kg/m  SpO2: SpO2: 96 % O2 Device: O2 Device: Nasal Cannula O2 Flow Rate: O2 Flow Rate (L/min): 3 L/min  Intake/output summary:   Intake/Output Summary (Last 24 hours) at 12/27/2020 0957 Last data filed at 12/27/2020 0800 Gross per 24 hour  Intake 1924.92 ml  Output 1280 ml  Net 644.92 ml   LBM: Last BM Date: 12/26/20 Baseline Weight: Weight: 63.5 kg Most recent weight: Weight: 62.7 kg       Palliative Assessment/Data: PPS: 40%    Flowsheet Rows   Flowsheet Row Most Recent Value  Intake Tab   Referral Department Hospitalist  Unit at Time of Referral ER  Palliative Care Primary Diagnosis Pulmonary  Date Notified 12/18/20  Palliative Care Type New Palliative care  Reason for referral Clarify Goals of Care  Date of Admission 12/17/20  Date first seen by Palliative Care 12/20/20  # of days Palliative referral response time 2 Day(s)  # of days IP prior to Palliative referral 1  Clinical Assessment   Psychosocial & Spiritual Assessment   Palliative Care Outcomes       Patient Active Problem List   Diagnosis Date Noted  . Atrial fibrillation with RVR ([place]) 12/17/2020  . Acute metabolic encephalopathy 123456  . Hypernatremia 12/17/2020  . Coagulation defect ([place])  09/27/2020  . Back pain, chronic 12/14/2018  . Edema 12/14/2018  . Prostate cancer ([place]) 12/14/2018  . History of amputation of lesser toe ([place]) 11/16/2018  . Chronic ulcer of right foot with fat layer exposed ([place]) 11/05/2018  . Contusion of right hip region 07/24/2018  . Multifocal pneumonia 03/18/2018  . Sepsis ([place]) 03/18/2018  . Malignant gastrointestinal stromal tumor (GIST) of stomach ([place]) 03/12/2018  . Urinary retention 02/24/2018  . Gastrointestinal stromal tumor (GIST) of stomach ([place])   . Restless leg syndrome   . Acute GI bleeding 02/16/2018  . Acute blood loss anemia 02/16/2018  . Chronic low back pain 02/05/2018  . Degeneration of lumbar intervertebral disc 02/05/2018  . Constipation 01/20/2018  . Exertional shortness of breath 12/30/2017  . Rupture of left quadriceps muscle 07/07/2017  . Rupture of left quadriceps tendon 06/26/2017  . Eczema 06/26/2017  . Chest pain 07/29/2015  . OSA on CPAP 07/10/2015  . CPAP use counseling 07/10/2015  . Excessive daytime sleepiness 02/22/2015  . Sleep apnea 02/22/2015  . Snoring 02/22/2015  . Nocturia more than twice per night 02/22/2015  . Syncope 12/31/2012  . Bifascicular bundle branch block 12/31/2012  . Cellulitis and abscess of leg, except foot 12/04/2012  . Chronic pain 12/04/2012  . Kyphosis 12/04/2012  . H/O unilateral nephrectomy 12/04/2012  . H/O prostate cancer 12/04/2012  . S/P TURP 12/04/2012    Palliative Care Assessment & Plan   Patient Profile: 87 y.o. male  with past medical history of atrial fibrillation, GIST tumor of stomach (s/p partial gastrectomy, tx'd with Gleevec- last CT showed no evidence of recurrence), R nephrectomy, L eye blindness, CKD, chronic pain, iron deficient anemia, chronic constipation, depression with decreased appetite, dysphagia, living at assisted living facility [facility]), recent COVID-19 infection at the end of December- admitted  on 12/17/2020 with increased confusion.   Workup revealed sepsis d/t pneumonia with loculated pleural effusion and a fib with RVR. Underwent thoracentesis yielding 225 cc fluid. HR improving with cardizem infusion. He remains confused. Has decreased oral intake- SLP evaluation shows chronic dysphagia, weak cough, but no overt signs of aspiration- recommendations for dysphagia management made. Palliative medicine consulted for goals of care.     1/26 Cognitive and nutritional status improving. On 1/25, IR chest tube placement due to worsening right sided loculated effusion. Plan is for fibrinolytics 12/27/20 and monitor output.  Assessment Sepsis secondary to left lower lobe pneumonia, resolved Loculated right pleural effusion Afib RVR Hypernatremia, resolved Metabolic encephalopathy, improved OSA  Recommendations/Plan  Continue current plan of care and medical management.  DNR, otherwise full scope treatment.  Continue PT/OT efforts. Likely will need SNF rehab on discharge.  Ongoing palliative discussions pending clinical course. Son has PMT contact information.   Goals of Care and Additional Recommendations:  Limitations on Scope of Treatment: Full Scope Treatment  Code Status:  DNR  Prognosis:  Guarded  Discharge Planning:  To Be Determined  Thank you for allowing the Palliative Medicine Team to assist in the care of this patient.  Total time: 20  Greater than 50% of this time was spent counseling and coordinating care related to the above assessment and plan.  Ihor Dow, DNP, FNP-C Palliative Medicine Team  Phone: [phone] Fax: [phone]  Please contact Palliative Medicine Team phone at [phone] for questions and concerns.

## 2020-12-27 NOTE — Progress Notes (Signed)
Referring Physician(s): Marni Griffon, NP  Supervising Physician: Daryll Brod  Patient Status:  [facility] - In-pt  Chief Complaint: Right empyema; S/p right chest tube in IR 12/26/20.   Subjective: Patient is in bed with his son in the room. He just finished working with PT. He denies pain or discomfort. Seems pleasantly confused.   Allergies: Coconut flavor [flavoring agent], Coconut oil, Doxycycline hyclate, Food, Black walnut flavor, Coconut oil, and Doxycycline  Medications: Prior to Admission medications   Medication Sig Start Date End Date Taking? Authorizing Provider  apixaban (ELIQUIS) 2.5 MG TABS tablet Take 1 tablet (2.5 mg total) by mouth 2 (two) times daily. 07/24/20  Yes Sherran Needs, NP  diltiazem (CARDIZEM SR) 120 MG 12 hr capsule Take 1 capsule (120 mg total) by mouth 2 (two) times daily. 07/24/20 08/24/20 Yes Sherran Needs, NP  gabapentin (NEURONTIN) 300 MG capsule Take 300 mg by mouth 3 (three) times daily as needed (legs-per MAR). Restless leg 05/14/20  Yes [provider]  mirtazapine (REMERON) 30 MG tablet Take 30 mg by mouth at bedtime. 11/21/20  Yes [provider]  MOVANTIK 25 MG TABS tablet Take 25 mg by mouth daily. 09/10/19  Yes [provider]  oxymorphone (OPANA) 10 MG tablet Take 10 mg by mouth every 6 (six) hours as needed for pain.  07/03/18  Yes [provider]  Jay Schlichter Oil (ARTIFICIAL TEARS) ointment Place 1 application into the left eye 3 (three) times daily. Left lower eye lid   Yes [provider]     Vital Signs: BP 118/77   Pulse (!) 106   Temp 97.8 F (36.6 C)   Resp 16   Ht 5\' 11"  (1.803 m)   Wt 138 lb 3.7 oz (62.7 kg)   SpO2 93%   BMI 19.28 kg/m   Physical Exam Constitutional:      General: He is not in acute distress. Cardiovascular:     Rate and Rhythm: Tachycardia present.  Pulmonary:     Effort: Pulmonary effort is normal.     Comments: Right chest tube;  anterior/mid-axillary. Dressing is clean and dry. Approximately 500 ml of clear, light brown serous fluid in collection container. No air leak identified.  Abdominal:     Palpations: Abdomen is soft.  Neurological:     Mental Status: He is alert.     Comments: Seems pleasantly confused. Able to answer some questions but did not know he had a chest tube.      Imaging: CT CHEST WO CONTRAST  Result Date: 12/25/2020 CLINICAL DATA:  Evaluate for effusion or abscess. EXAM: CT CHEST WITHOUT CONTRAST TECHNIQUE: Multidetector CT imaging of the chest was performed following the standard protocol without IV contrast. COMPARISON:  12/17/2020 . FINDINGS: Cardiovascular: Cardiac enlargement, unchanged. Aortic atherosclerosis identified. Coronary artery atherosclerotic calcifications. No pericardial effusion. Mediastinum/Nodes: Thyroid gland is unremarkable. The trachea appears patent and is midline. Normal appearance of the esophagus. No enlarged axillary, supraclavicular, or mediastinal lymph nodes. Hilar lymph nodes are suboptimally evaluated due to lack of IV contrast material. Lungs/Pleura: Moderate to large loculated right pleural effusion appears increased in volume when compared with 12/17/2020. Fluid is now extending over the right lung apex. Gas with air-fluid level is noted within the posterior component which is new from previous exam, image 106/2. There is atelectasis of the entire right lower lobe with partial atelectasis of the right middle lobe and right upper lobe. Progressive consolidation involving the posteromedial left lower lobe containing  air bronchograms. Trace left pleural fluid. Patchy atelectasis is identified within the superior segment of left lower lobe. Several persistent patchy areas of ground-glass and airspace density noted within the anterior left upper lobe, similar to previous exam. Upper Abdomen: No acute abnormality. Previous cholecystectomy. Extensive aortic atherosclerosis with  branch vessel disease. Trace amount of perihepatic ascites is new from previous exam. Musculoskeletal: Degenerative disc disease is identified within the thoracic and lumbar spine. Thoracolumbar scoliosis. IMPRESSION: 1. Moderate to large loculated right pleural effusion is increased in volume when compared with 12/17/2020. New focus of gas with air-fluid level is noted within the posterior component of the effusion. 2. Progressive atelectasis of the entire right lower lobe with partial atelectasis of the right middle lobe and right upper lobe. 3. Progressive consolidation involving the posteromedial left lower lobe containing air bronchograms. 4. Similar appearance of patchy areas of ground-glass and airspace density within the anterior left upper lobe. Likely post infectious or inflammatory in etiology. 5. Aortic atherosclerosis. Coronary artery calcifications. Aortic Atherosclerosis (ICD10-I70.0). Electronically Signed   By: Kerby Moors M.D.   On: 12/25/2020 11:18   DG CHEST PORT 1 VIEW  Result Date: 12/27/2020 CLINICAL DATA:  Empyema. EXAM: PORTABLE CHEST 1 VIEW COMPARISON:  CT 12/25/2020. FINDINGS: Chest tube noted over right lower chest. Right pleural effusion/empyema again noted, decreased in size from prior exam. Persistent bibasilar atelectasis/infiltrates. Stable cardiomegaly. IMPRESSION: 1. Chest tube noted over the right lower chest. Right pleural effusion/empyema again noted, decreased in size from prior exam. 2.  Persistent bibasilar atelectasis/infiltrates. 3.  Stable cardiomegaly. Electronically Signed   By: Marcello Moores  Register   On: 12/27/2020 05:59   DG Chest Port 1V same Day  Result Date: 12/25/2020 CLINICAL DATA:  Pneumonia EXAM: PORTABLE CHEST 1 VIEW COMPARISON:  12/18/2020 FINDINGS: Dense right upper lobe opacity where there was loculated pleural fluid by prior CT and now the shape of a right upper lobe collapse. Increased dense right lower lobe opacity. Continued retrocardiac airspace  disease. Normal heart size. IMPRESSION: 1. Significant worsening of right-sided aeration from increasing loculated pleural fluid. There is likely superimposed right upper lobe collapse. 2. Continued retrocardiac infiltrate. Electronically Signed   By: Monte Fantasia M.D.   On: 12/25/2020 04:16   CT IMAGE GUIDED FLUID DRAIN BY CATHETER  Result Date: 12/27/2020 INDICATION: Loculated right-sided pleural effusion. Please perform CT-guided chest tube placement for infection source purposes and potentially for the administration intrapleural tPA. EXAM: CT IMAGE GUIDED FLUID DRAIN BY CATHETER COMPARISON:  None. MEDICATIONS: The patient is currently admitted to the hospital and receiving intravenous antibiotics. The antibiotics were administered within an appropriate time frame prior to the initiation of the procedure. ANESTHESIA/SEDATION: None CONTRAST:  None COMPLICATIONS: None immediate. PROCEDURE: Informed written consent was obtained from the patient after a discussion of the risks, benefits and alternatives to treatment. The patient was placed supine, slightly RPO on the CT gantry and a pre procedural CT was performed re-demonstrating the known complex partially loculated right-sided pleural effusion. An access site along the inferior lateral aspect of the right chest wall was marked and the procedure was planned. A timeout was performed prior to the initiation of the procedure. The skin overlying the inferior aspect the right lateral chest wall was prepped and draped in the usual sterile fashion. The overlying soft tissues were anesthetized with 1% lidocaine with epinephrine. Appropriate trajectory was planned with the use of a 22 gauge spinal needle. An 18 gauge trocar needle was advanced into the abscess/fluid collection and a  short Amplatz super stiff wire was coiled within the collection. Appropriate positioning was confirmed with a limited CT scan. The tract was serially dilated allowing placement of a 14  French all-purpose drainage catheter. Appropriate positioning was confirmed with a limited postprocedural CT scan. Approximately 20 cc of blood tinged serous pleural fluid was aspirated. The tube was connected to a pleura vac device and sutured in place. A dressing was placed. The patient tolerated the procedure well without immediate post procedural complication. IMPRESSION: Successful CT guided placement of a 31 French all purpose drain catheter into the basilar aspect of the right pleural space with aspiration of 20 cc of blood tinged serous pleural fluid. Samples were sent to the laboratory as requested by the ordering clinical team. Electronically Signed   By: Sandi Mariscal M.D.   On: 12/27/2020 08:23    Labs:  CBC: Recent Labs    12/22/20 0528 12/23/20 0059 12/24/20 0545 12/27/20 0548  WBC 21.2* 16.4* 18.6* 14.7*  HGB 11.6* 12.3* 12.1* 12.2*  HCT 37.7* 39.9 38.6* 37.9*  PLT 333 343 359 369    COAGS: Recent Labs    09/01/20 0824  INR 1.2    BMP: Recent Labs    06/26/20 0005 07/13/20 0944 07/27/20 1230 09/01/20 0824 10/13/20 0831 12/19/20 0400 12/20/20 0259 12/25/20 0541 12/27/20 0548  NA 141 142 139 143   < > 147* 144 142 140  K 4.5 4.2 4.4 4.0   < > 3.3* 3.5 3.6 4.2  CL 104 108 102 106   < > 109 109 109 104  CO2 28 28 27 29    < > 28 25 26 24   GLUCOSE 112* 58* 102* 88   < > 131* 118* 107* 92  BUN 21 19 22  24*   < > 20 17 10 14   CALCIUM 8.9 9.3 9.2 9.4   < > 8.3* 8.2* 7.9* 7.8*  CREATININE 0.97 1.05 1.08 0.98   < > 0.79 0.83 0.78 0.69  GFRNONAA >60 >60 60* >60   < > >60 >60 >60 >60  GFRAA >60 >60 >60 >60  --   --   --   --   --    < > = values in this interval not displayed.    LIVER FUNCTION TESTS: Recent Labs    10/13/20 0831 12/17/20 1138 12/18/20 0425 12/20/20 0259  BILITOT 0.6 0.8 0.7 0.7  AST 33 54* 52* 45*  ALT 22 33 34 36  ALKPHOS 78 81 82 76  PROT 6.2* 7.0 6.0* 5.6*  ALBUMIN 3.6 3.1* 2.6* 2.5*    Assessment and Plan:  Right empyema; S/p  chest tube 12/26/20: 500 ml of output in the collection container. No air leak identified. Patient is afebrile and without pain. Critical care instilled Cathflo Activase and Pulmozyme in the chest tube today. Chest tube is currently to water seal.   Plans per Critical care. Please call IR with any questions.     Electronically Signed: Soyla Dryer, AGACNP-BC [phone] 12/27/2020, 4:31 PM   I spent a total of 15 Minutes at the the patient's bedside AND on the patient's hospital floor or unit, greater than 50% of which was counseling/coordinating care for right chest tube.

## 2020-12-27 NOTE — Progress Notes (Signed)
EKG performed due to continued high heart rate, spiking to 160's-170. Provider updated.    Candie Mile, RN

## 2020-12-27 NOTE — Progress Notes (Signed)
   12/27/20 2000  Vitals  Temp 98.5 F (36.9 C)  Temp Source Oral  BP 123/86  MAP (mmHg) 99  BP Location Right Arm  BP Method Automatic  Patient Position (if appropriate) Lying  Pulse Rate (!) 131  ECG Heart Rate (!) 136  Resp (!) 26  Level of Consciousness  Level of Consciousness Alert  MEWS COLOR  MEWS Score Color Red  Oxygen Therapy  SpO2 98 %  O2 Device Room Air  MEWS Score  MEWS Temp 0  MEWS Systolic 0  MEWS Pulse 3  MEWS RR 2  MEWS LOC 0  MEWS Score 5  Provider Notification  Provider Name/Title [facility]  Date Provider Notified 12/27/20  Time Provider Notified [phone]  Notification Type Page  Notification Reason Change in status  Response See new orders  Date of Provider Response 12/27/20  Time of Provider Response 0845   Red MEWS due to high heart rate and respirations.

## 2020-12-27 NOTE — Procedures (Signed)
12/27/2020 0932 hrs. Instillation of Cathflo Activase 10 mg and Pulmozyme 5 mg into right chest tube. Patient identified and confirmed Casey Gates is due for chest tube injection events monitor material Pulmozyme and Cathflo Activase were inserted without problem into right chest tube. Stopcock was turned off. Registered nurse was given instructions on to open stopcock in 1 hour. No complications were noted .  Richardson Landry Keily Lepp ACNP Acute Care Nurse Practitioner [place] Please consult Amion 12/27/2020, 9:35 AM

## 2020-12-28 ENCOUNTER — Inpatient Hospital Stay (HOSPITAL_COMMUNITY): Payer: Medicare PPO

## 2020-12-28 DIAGNOSIS — I4891 Unspecified atrial fibrillation: Secondary | ICD-10-CM | POA: Diagnosis not present

## 2020-12-28 DIAGNOSIS — J869 Pyothorax without fistula: Secondary | ICD-10-CM | POA: Diagnosis not present

## 2020-12-28 DIAGNOSIS — G9341 Metabolic encephalopathy: Secondary | ICD-10-CM | POA: Diagnosis not present

## 2020-12-28 LAB — CBC WITH DIFFERENTIAL/PLATELET
Abs Immature Granulocytes: 0.81 10*3/uL — ABNORMAL HIGH (ref 0.00–0.07)
Basophils Absolute: 0.1 10*3/uL (ref 0.0–0.1)
Basophils Relative: 0 %
Eosinophils Absolute: 0 10*3/uL (ref 0.0–0.5)
Eosinophils Relative: 0 %
HCT: 37 % — ABNORMAL LOW (ref 39.0–52.0)
Hemoglobin: 11.6 g/dL — ABNORMAL LOW (ref 13.0–17.0)
Immature Granulocytes: 2 %
Lymphocytes Relative: 4 %
Lymphs Abs: 1.3 10*3/uL (ref 0.7–4.0)
MCH: 28.3 pg (ref 26.0–34.0)
MCHC: 31.4 g/dL (ref 30.0–36.0)
MCV: 90.2 fL (ref 80.0–100.0)
Monocytes Absolute: 2.1 10*3/uL — ABNORMAL HIGH (ref 0.1–1.0)
Monocytes Relative: 6 %
Neutro Abs: 32.9 10*3/uL — ABNORMAL HIGH (ref 1.7–7.7)
Neutrophils Relative %: 88 %
Platelets: 361 10*3/uL (ref 150–400)
RBC: 4.1 MIL/uL — ABNORMAL LOW (ref 4.22–5.81)
RDW: 14.5 % (ref 11.5–15.5)
WBC: 37.3 10*3/uL — ABNORMAL HIGH (ref 4.0–10.5)
nRBC: 0 % (ref 0.0–0.2)

## 2020-12-28 LAB — COMPREHENSIVE METABOLIC PANEL
ALT: 31 U/L (ref 0–44)
ALT: 31 U/L (ref 0–44)
AST: 38 U/L (ref 15–41)
AST: 35 U/L (ref 15–41)
Albumin: 1.8 g/dL — ABNORMAL LOW (ref 3.5–5.0)
Albumin: 1.7 g/dL — ABNORMAL LOW (ref 3.5–5.0)
Alkaline Phosphatase: 84 U/L (ref 38–126)
Alkaline Phosphatase: 81 U/L (ref 38–126)
Anion gap: 11 (ref 5–15)
Anion gap: 5 (ref 5–15)
BUN: 17 mg/dL (ref 8–23)
BUN: 17 mg/dL (ref 8–23)
CO2: 23 mmol/L (ref 22–32)
CO2: 27 mmol/L (ref 22–32)
Calcium: 7.7 mg/dL — ABNORMAL LOW (ref 8.9–10.3)
Calcium: 7.5 mg/dL — ABNORMAL LOW (ref 8.9–10.3)
Chloride: 103 mmol/L (ref 98–111)
Chloride: 108 mmol/L (ref 98–111)
Creatinine, Ser: 0.88 mg/dL (ref 0.61–1.24)
Creatinine, Ser: 0.91 mg/dL (ref 0.61–1.24)
GFR, Estimated: >60 mL/min (ref >60)
GFR, Estimated: >60 mL/min (ref >60)
Glucose, Bld: 122 mg/dL — ABNORMAL HIGH (ref 70–99)
Glucose, Bld: 116 mg/dL — ABNORMAL HIGH (ref 70–99)
Potassium: 3.7 mmol/L (ref 3.5–5.1)
Potassium: 3.8 mmol/L (ref 3.5–5.1)
Sodium: 137 mmol/L (ref 135–145)
Sodium: 140 mmol/L (ref 135–145)
Total Bilirubin: 0.4 mg/dL (ref 0.3–1.2)
Total Bilirubin: 0.7 mg/dL (ref 0.3–1.2)
Total Protein: 5 g/dL — ABNORMAL LOW (ref 6.5–8.1)
Total Protein: 4.9 g/dL — ABNORMAL LOW (ref 6.5–8.1)

## 2020-12-28 LAB — CBC
HCT: 38 % — ABNORMAL LOW (ref 39.0–52.0)
Hemoglobin: 11.6 g/dL — ABNORMAL LOW (ref 13.0–17.0)
MCH: 27.8 pg (ref 26.0–34.0)
MCHC: 30.5 g/dL (ref 30.0–36.0)
MCV: 91.1 fL (ref 80.0–100.0)
Platelets: 334 10*3/uL (ref 150–400)
RBC: 4.17 MIL/uL — ABNORMAL LOW (ref 4.22–5.81)
RDW: 14.4 % (ref 11.5–15.5)
WBC: 23.1 10*3/uL — ABNORMAL HIGH (ref 4.0–10.5)
nRBC: 0 % (ref 0.0–0.2)

## 2020-12-28 LAB — URINALYSIS, ROUTINE W REFLEX MICROSCOPIC
Bilirubin Urine: NEGATIVE
Glucose, UA: NEGATIVE mg/dL
Ketones, ur: 5 mg/dL — AB
Nitrite: NEGATIVE
Protein, ur: 30 mg/dL — AB
RBC / HPF: >50 RBC/hpf — ABNORMAL HIGH (ref 0–5)
Specific Gravity, Urine: 1.026 (ref 1.005–1.030)
pH: 5 (ref 5.0–8.0)

## 2020-12-28 LAB — BLOOD GAS, ARTERIAL
Acid-Base Excess: 0.7 mmol/L (ref 0.0–2.0)
Bicarbonate: 26.3 mmol/L (ref 20.0–28.0)
O2 Saturation: 91 %
Patient temperature: 97.9
pCO2 arterial: 48.6 mmHg — ABNORMAL HIGH (ref 32.0–48.0)
pH, Arterial: 7.35 (ref 7.350–7.450)
pO2, Arterial: 63.8 mmHg — ABNORMAL LOW (ref 83.0–108.0)

## 2020-12-28 LAB — LACTIC ACID, PLASMA
Lactic Acid, Venous: 2.3 mmol/L (ref 0.5–1.9)
Lactic Acid, Venous: 1 mmol/L (ref 0.5–1.9)
Lactic Acid, Venous: 1.3 mmol/L (ref 0.5–1.9)

## 2020-12-28 LAB — MAGNESIUM: Magnesium: 2.2 mg/dL (ref 1.7–2.4)

## 2020-12-28 LAB — PROTIME-INR
INR: 1.4 — ABNORMAL HIGH (ref 0.8–1.2)
Prothrombin Time: 17 seconds — ABNORMAL HIGH (ref 11.4–15.2)

## 2020-12-28 LAB — TROPONIN I (HIGH SENSITIVITY): Troponin I (High Sensitivity): 14 ng/L (ref <18)

## 2020-12-28 LAB — PROCALCITONIN: Procalcitonin: 0.13 ng/mL

## 2020-12-28 LAB — PHOSPHORUS
Phosphorus: 2.4 mg/dL — ABNORMAL LOW (ref 2.5–4.6)
Phosphorus: 3.3 mg/dL (ref 2.5–4.6)

## 2020-12-28 MED ORDER — CHLORHEXIDINE GLUCONATE 0.12 % MT SOLN
15.0000 mL | Freq: Two times a day (BID) | OROMUCOSAL | Status: DC
  Administered 2020-12-28 – 2021-01-10 (×24): 15 mL via OROMUCOSAL
  Filled 2020-12-28 (×19): qty 15

## 2020-12-28 MED ORDER — ORAL CARE MOUTH RINSE
15.0000 mL | Freq: Two times a day (BID) | OROMUCOSAL | Status: DC
  Administered 2020-12-29 – 2021-01-10 (×24): 15 mL via OROMUCOSAL

## 2020-12-28 MED ORDER — SODIUM CHLORIDE 0.9 % IV SOLN
250.0000 mL | INTRAVENOUS | Status: DC
  Administered 2020-12-31: 250 mL via INTRAVENOUS

## 2020-12-28 MED ORDER — SODIUM CHLORIDE (PF) 0.9 % IJ SOLN
10.0000 mg | Freq: Once | INTRAMUSCULAR | Status: AC
  Administered 2020-12-28: 10 mg via INTRAPLEURAL
  Filled 2020-12-28: qty 10

## 2020-12-28 MED ORDER — LACTATED RINGERS IV BOLUS
750.0000 mL | Freq: Once | INTRAVENOUS | Status: AC
  Administered 2020-12-28: 750 mL via INTRAVENOUS

## 2020-12-28 MED ORDER — LACTATED RINGERS IV BOLUS
500.0000 mL | Freq: Once | INTRAVENOUS | Status: AC
  Administered 2020-12-28: 500 mL via INTRAVENOUS

## 2020-12-28 MED ORDER — SODIUM CHLORIDE 0.9 % IV SOLN
1.0000 g | Freq: Two times a day (BID) | INTRAVENOUS | Status: DC
  Administered 2020-12-28 – 2020-12-30 (×4): 1 g via INTRAVENOUS
  Filled 2020-12-28 (×4): qty 1

## 2020-12-28 MED ORDER — AMIODARONE IV BOLUS ONLY 150 MG/100ML
150.0000 mg | Freq: Once | INTRAVENOUS | Status: AC
  Administered 2020-12-28: 150 mg via INTRAVENOUS
  Filled 2020-12-28: qty 100

## 2020-12-28 MED ORDER — PHENYLEPHRINE HCL-NACL 10-0.9 MG/250ML-% IV SOLN
INTRAVENOUS | Status: AC
  Administered 2020-12-28: 10 mg
  Filled 2020-12-28: qty 250

## 2020-12-28 MED ORDER — SODIUM CHLORIDE 0.9 % IV BOLUS
250.0000 mL | Freq: Once | INTRAVENOUS | Status: AC
  Administered 2020-12-28: 250 mL via INTRAVENOUS

## 2020-12-28 MED ORDER — STERILE WATER FOR INJECTION IJ SOLN
5.0000 mg | Freq: Once | RESPIRATORY_TRACT | Status: AC
  Administered 2020-12-28: 5 mg via INTRAPLEURAL
  Filled 2020-12-28: qty 5

## 2020-12-28 MED ORDER — SODIUM CHLORIDE 0.9 % IV SOLN: INTRAVENOUS | Status: DC

## 2020-12-28 MED ORDER — SODIUM CHLORIDE 0.9 % IV BOLUS
1000.0000 mL | Freq: Once | INTRAVENOUS | Status: AC
  Administered 2020-12-28: 1000 mL via INTRAVENOUS

## 2020-12-28 MED ORDER — PHENYLEPHRINE HCL-NACL 10-0.9 MG/250ML-% IV SOLN
25.0000 ug/min | INTRAVENOUS | Status: DC
  Administered 2020-12-28 – 2020-12-30 (×6): 25 ug/min via INTRAVENOUS
  Administered 2020-12-30: 21:00:00 75 ug/min via INTRAVENOUS
  Administered 2020-12-30 – 2020-12-31 (×4): 35 ug/min via INTRAVENOUS
  Administered 2020-12-31: 60 ug/min via INTRAVENOUS
  Administered 2020-12-31: 04:00:00 50 ug/min via INTRAVENOUS
  Administered 2021-01-01: 25 ug/min via INTRAVENOUS
  Administered 2021-01-01: 35 ug/min via INTRAVENOUS
  Administered 2021-01-01: 25 ug/min via INTRAVENOUS
  Administered 2021-01-02: 20 ug/min via INTRAVENOUS
  Filled 2020-12-28 (×5): qty 250
  Filled 2020-12-28: qty 500
  Filled 2020-12-28 (×11): qty 250

## 2020-12-28 NOTE — Progress Notes (Signed)
Referring Physician(s): Erick Colace (CCM)  Supervising Physician: Arne Cleveland  Patient Status:  [facility] - In-pt  Chief Complaint: "I don't feel well" and "belly pain"  Subjective:  History of loculated right pleural effusion concerning for empyema s/p right chest tube placement in IR 12/26/2020. Patient laying in bed resting comfortably. He responds to voice and answers questions appropriately. States he "doesn't feel well" and complaints of "belly pain". Son at bedside. Per RN new onset fever and tachycardia. Right chest tube site c/d/i.   Allergies: Coconut flavor [flavoring agent], Coconut oil, Doxycycline hyclate, Food, Black walnut flavor, Coconut oil, and Doxycycline  Medications: Prior to Admission medications   Medication Sig Start Date End Date Taking? Authorizing Provider  apixaban (ELIQUIS) 2.5 MG TABS tablet Take 1 tablet (2.5 mg total) by mouth 2 (two) times daily. 07/24/20  Yes Sherran Needs, NP  diltiazem (CARDIZEM SR) 120 MG 12 hr capsule Take 1 capsule (120 mg total) by mouth 2 (two) times daily. 07/24/20 08/24/20 Yes Sherran Needs, NP  gabapentin (NEURONTIN) 300 MG capsule Take 300 mg by mouth 3 (three) times daily as needed (legs-per MAR). Restless leg 05/14/20  Yes [provider]  mirtazapine (REMERON) 30 MG tablet Take 30 mg by mouth at bedtime. 11/21/20  Yes [provider]  MOVANTIK 25 MG TABS tablet Take 25 mg by mouth daily. 09/10/19  Yes [provider]  oxymorphone (OPANA) 10 MG tablet Take 10 mg by mouth every 6 (six) hours as needed for pain.  07/03/18  Yes [provider]  Jay Schlichter Oil (ARTIFICIAL TEARS) ointment Place 1 application into the left eye 3 (three) times daily. Left lower eye lid   Yes [provider]     Vital Signs: BP (!) 85/54   Pulse 99   Temp (!) 97.3 F (36.3 C) (Oral)   Resp (!) 28   Ht 5\' 11"  (1.803 m)   Wt 138 lb 3.7 oz (62.7 kg)   SpO2 95%   BMI 19.28  kg/m   Physical Exam Vitals and nursing note reviewed.  Constitutional:      General: He is not in acute distress.    Appearance: He is ill-appearing.  Cardiovascular:     Rate and Rhythm: Tachycardia present.  Pulmonary:     Effort: Pulmonary effort is normal. No respiratory distress.     Comments: Right chest tube site without tenderness, erythema, drainage, or active bleeding; approximately 950 cc serosanguinous fluid with purulent debris in pleure-vac; tube to water seal with (+) air leak. Skin:    General: Skin is warm and dry.  Neurological:     Mental Status: He is alert.     Imaging: CT CHEST WO CONTRAST  Result Date: 12/25/2020 CLINICAL DATA:  Evaluate for effusion or abscess. EXAM: CT CHEST WITHOUT CONTRAST TECHNIQUE: Multidetector CT imaging of the chest was performed following the standard protocol without IV contrast. COMPARISON:  12/17/2020 . FINDINGS: Cardiovascular: Cardiac enlargement, unchanged. Aortic atherosclerosis identified. Coronary artery atherosclerotic calcifications. No pericardial effusion. Mediastinum/Nodes: Thyroid gland is unremarkable. The trachea appears patent and is midline. Normal appearance of the esophagus. No enlarged axillary, supraclavicular, or mediastinal lymph nodes. Hilar lymph nodes are suboptimally evaluated due to lack of IV contrast material. Lungs/Pleura: Moderate to large loculated right pleural effusion appears increased in volume when compared with 12/17/2020. Fluid is now extending over the right lung apex. Gas with air-fluid level is noted within the posterior component which is new from previous exam,  image 106/2. There is atelectasis of the entire right lower lobe with partial atelectasis of the right middle lobe and right upper lobe. Progressive consolidation involving the posteromedial left lower lobe containing air bronchograms. Trace left pleural fluid. Patchy atelectasis is identified within the superior segment of left lower lobe.  Several persistent patchy areas of ground-glass and airspace density noted within the anterior left upper lobe, similar to previous exam. Upper Abdomen: No acute abnormality. Previous cholecystectomy. Extensive aortic atherosclerosis with branch vessel disease. Trace amount of perihepatic ascites is new from previous exam. Musculoskeletal: Degenerative disc disease is identified within the thoracic and lumbar spine. Thoracolumbar scoliosis. IMPRESSION: 1. Moderate to large loculated right pleural effusion is increased in volume when compared with 12/17/2020. New focus of gas with air-fluid level is noted within the posterior component of the effusion. 2. Progressive atelectasis of the entire right lower lobe with partial atelectasis of the right middle lobe and right upper lobe. 3. Progressive consolidation involving the posteromedial left lower lobe containing air bronchograms. 4. Similar appearance of patchy areas of ground-glass and airspace density within the anterior left upper lobe. Likely post infectious or inflammatory in etiology. 5. Aortic atherosclerosis. Coronary artery calcifications. Aortic Atherosclerosis (ICD10-I70.0). Electronically Signed   By: Kerby Moors M.D.   On: 12/25/2020 11:18   DG Chest Port 1 View  Result Date: 12/28/2020 CLINICAL DATA:  RIGHT-sided chest tube. EXAM: PORTABLE CHEST 1 VIEW COMPARISON:  December 25, 2020, December 27, 2020 FINDINGS: The cardiomediastinal silhouette is unchanged in contour.RIGHT-sided chest tube. Incomplete assessment of the RIGHT costophrenic angle. Moderate loculated RIGHT pleural effusion. This is similar in comparison to most recent prior but decreased since December 25, 2020. Decreased conspicuity of trace air component noted on CT. No LEFT-sided pneumothorax. Trace LEFT pleural effusion. Unchanged homogeneous opacification of the RIGHT lung base. Unchanged LEFT retrocardiac opacity, possibly atelectasis. Abdomen is out of the field of view.  Multilevel degenerative changes of the thoracic spine. IMPRESSION: 1. Unchanged moderate RIGHT loculated pleural effusion since most recent prior. It is overall decreased since December 25, 2020 2. Unchanged LEFT retrocardiac opacity. Electronically Signed   By: Valentino Saxon MD   On: 12/28/2020 07:51   DG CHEST PORT 1 VIEW  Result Date: 12/27/2020 CLINICAL DATA:  Empyema. EXAM: PORTABLE CHEST 1 VIEW COMPARISON:  CT 12/25/2020. FINDINGS: Chest tube noted over right lower chest. Right pleural effusion/empyema again noted, decreased in size from prior exam. Persistent bibasilar atelectasis/infiltrates. Stable cardiomegaly. IMPRESSION: 1. Chest tube noted over the right lower chest. Right pleural effusion/empyema again noted, decreased in size from prior exam. 2.  Persistent bibasilar atelectasis/infiltrates. 3.  Stable cardiomegaly. Electronically Signed   By: Marcello Moores  Register   On: 12/27/2020 05:59   DG Chest Port 1V same Day  Result Date: 12/25/2020 CLINICAL DATA:  Pneumonia EXAM: PORTABLE CHEST 1 VIEW COMPARISON:  12/18/2020 FINDINGS: Dense right upper lobe opacity where there was loculated pleural fluid by prior CT and now the shape of a right upper lobe collapse. Increased dense right lower lobe opacity. Continued retrocardiac airspace disease. Normal heart size. IMPRESSION: 1. Significant worsening of right-sided aeration from increasing loculated pleural fluid. There is likely superimposed right upper lobe collapse. 2. Continued retrocardiac infiltrate. Electronically Signed   By: Monte Fantasia M.D.   On: 12/25/2020 04:16   CT IMAGE GUIDED FLUID DRAIN BY CATHETER  Result Date: 12/27/2020 INDICATION: Loculated right-sided pleural effusion. Please perform CT-guided chest tube placement for infection source purposes and potentially for the administration intrapleural tPA.  EXAM: CT IMAGE GUIDED FLUID DRAIN BY CATHETER COMPARISON:  None. MEDICATIONS: The patient is currently admitted to the  hospital and receiving intravenous antibiotics. The antibiotics were administered within an appropriate time frame prior to the initiation of the procedure. ANESTHESIA/SEDATION: None CONTRAST:  None COMPLICATIONS: None immediate. PROCEDURE: Informed written consent was obtained from the patient after a discussion of the risks, benefits and alternatives to treatment. The patient was placed supine, slightly RPO on the CT gantry and a pre procedural CT was performed re-demonstrating the known complex partially loculated right-sided pleural effusion. An access site along the inferior lateral aspect of the right chest wall was marked and the procedure was planned. A timeout was performed prior to the initiation of the procedure. The skin overlying the inferior aspect the right lateral chest wall was prepped and draped in the usual sterile fashion. The overlying soft tissues were anesthetized with 1% lidocaine with epinephrine. Appropriate trajectory was planned with the use of a 22 gauge spinal needle. An 18 gauge trocar needle was advanced into the abscess/fluid collection and a short Amplatz super stiff wire was coiled within the collection. Appropriate positioning was confirmed with a limited CT scan. The tract was serially dilated allowing placement of a 14 French all-purpose drainage catheter. Appropriate positioning was confirmed with a limited postprocedural CT scan. Approximately 20 cc of blood tinged serous pleural fluid was aspirated. The tube was connected to a pleura vac device and sutured in place. A dressing was placed. The patient tolerated the procedure well without immediate post procedural complication. IMPRESSION: Successful CT guided placement of a 73 French all purpose drain catheter into the basilar aspect of the right pleural space with aspiration of 20 cc of blood tinged serous pleural fluid. Samples were sent to the laboratory as requested by the ordering clinical team. Electronically Signed    By: Sandi Mariscal M.D.   On: 12/27/2020 08:23    Labs:  CBC: Recent Labs    12/23/20 0059 12/24/20 0545 12/27/20 0548 12/28/20 0919  WBC 16.4* 18.6* 14.7* 37.3*  HGB 12.3* 12.1* 12.2* 11.6*  HCT 39.9 38.6* 37.9* 37.0*  PLT 343 359 369 361    COAGS: Recent Labs    09/01/20 0824  INR 1.2    BMP: Recent Labs    06/26/20 0005 07/13/20 0944 07/27/20 1230 09/01/20 0824 10/13/20 0831 12/20/20 0259 12/25/20 0541 12/27/20 0548 12/28/20 0919  NA 141 142 139 143   < > 144 142 140 137  K 4.5 4.2 4.4 4.0   < > 3.5 3.6 4.2 3.7  CL 104 108 102 106   < > 109 109 104 103  CO2 28 28 27 29    < > 25 26 24 23   GLUCOSE 112* 58* 102* 88   < > 118* 107* 92 122*  BUN 21 19 22  24*   < > 17 10 14 17   CALCIUM 8.9 9.3 9.2 9.4   < > 8.2* 7.9* 7.8* 7.7*  CREATININE 0.97 1.05 1.08 0.98   < > 0.83 0.78 0.69 0.88  GFRNONAA >60 >60 60* >60   < > >60 >60 >60 >60  GFRAA >60 >60 >60 >60  --   --   --   --   --    < > = values in this interval not displayed.    LIVER FUNCTION TESTS: Recent Labs    12/17/20 1138 12/18/20 0425 12/20/20 0259 12/28/20 0919  BILITOT 0.8 0.7 0.7 0.4  AST 54* 52* 45* 38  ALT 33 34 36 31  ALKPHOS 81 82 76 84  PROT 7.0 6.0* 5.6* 5.0*  ALBUMIN 3.1* 2.6* 2.5* 1.8*    Assessment and Plan:  History of loculated right pleural effusion concerning for empyema s/p right chest tube placement in IR 12/26/2020. Right chest tube stable with approximately 950 cc serosanguinous fluid with purulent debris in pleure-vac; tube to water seal with (+) air leak. Continue current chest tube management- tube to water seal, continue with serial CXR, further chest tube management per CCM. Further plans per TRH/CCM/cardiology- appreciate and agree with management. IR will continue to follow peripherally- please call IR with questions/concerns.   Electronically Signed: Earley Abide, PA-C 12/28/2020, 11:27 AM   I spent a total of 25 Minutes at the the patient's bedside AND on the  patient's hospital floor or unit, greater than 50% of which was counseling/coordinating care for right empyema s/p right chest tube placement.

## 2020-12-28 NOTE — Progress Notes (Signed)
NAME:  Casey Cotham., MRN:  [id number], DOB:  [birth year], LOS: 11 ADMISSION DATE:  12/17/2020, CONSULTATION DATE: 1/24 REFERRING MD:  Darrick Meigs , CHIEF COMPLAINT:  Exudative pleural effusion    Brief History:  87 year old male initially admitted on 1/16 healthcare associated pneumonia and associated right-sided loculated pleural effusion.  Completed broad-spectrum antibiotic course and in spite of appropriate therapy developed worsening loculated right pleural effusion and evolution of air/gas fluid level.  Critical care asked to evaluate  Past Medical History:  Obstructive sleep apnea, atrial fibrillation on chronic anticoagulation, chronic pain syndrome. Significant Hospital Events:  1/16 admitted, started on broad-spectrum antibiotics 1/ 17: Exudative pleural effusion by thoracentesis, 225 cc of hazy amber fluid removed 1/18 cardiology consulted for atrial fibrillation with RVR 1/19 azithromycin completed 1/23: Ceftriaxone completed 1/24: Pulmonary asked to evaluate given worsening loculated right pleural effusion 12/27/2020 right chest tube with Cathflo Activase 10 mg Pulmozyme 5 mg instilled. Consults:    Procedures:  thora 1/17   - LDH 1318, 97% polys  Significant Diagnostic Tests:  CT chest 1/24: 1. Moderate to large loculated right pleural effusion is increased in volume when compared with 12/17/2020. New focus of gas with air-fluid level is noted within the posterior component of the effusion. 2. Progressive atelectasis of the entire right lower lobe with partial atelectasis of the right middle lobe and right upper lobe. 3. Progressive consolidation involving the posteromedial left lower lobe containing air bronchograms. 4. Similar appearance of patchy areas of ground-glass and airspace density within the anterior left upper lobe. Likely post infectious or inflammatory in etiology. 5. Aortic atherosclerosis. Coronary artery calcifications.  Micro Data:     Antimicrobials:   1/16 azith X 5 1/16  -> 1/23 1/24 vanc and cefepime>>> Interim History / Subjective:    Objective   Blood pressure (Abnormal) 95/57, pulse (Abnormal) 104, temperature (Abnormal) 97.3 F (36.3 C), temperature source Oral, resp. rate (Abnormal) 28, height 5\' 11"  (1.803 m), weight 62.7 kg, SpO2 95 %.        Intake/Output Summary (Last 24 hours) at 12/28/2020 1446 Last data filed at 12/28/2020 1400 Gross per 24 hour  Intake 1517.51 ml  Output 1870 ml  Net -352.49 ml   Filed Weights   12/17/20 2323 12/19/20 0635 12/26/20 1112  Weight: 63.5 kg 57.7 kg 62.7 kg   Exam:   General 60 year white male resting in bed HENT NCAT no JVD poor dentition  pulm scattered rhonchi no accessory use. Currently 3 liters. Right Chest tube in place. Old bloody drainage.  Card RRR Ext warm and dry  abd soft not tender  Neuro awake very hard of hearing sp garbled.  GU clear yellow   Resolved Hospital Problem list     Assessment & Plan:   Rt sided loculated effusion s/p chest tube by IR 12/26/20 S/p fibrinolytics 1/26 w/ 720 ml out after.  pcxr chest tube in satisfactory position however no significant change in moderate loculated right-sided effusion with right greater than left airspace disease. Plan Repeat fibrinolytic therapy today Continue to monitor chest tube output Continue broad-spectrum antibiotics, he is now day #4 vancomycin and cefepime Repeat a.m. chest x-ray  Mild wheezing/bronchospasm Plan Continue bronchodilator  SIRS/sepsis/septic shock -Spiking fever, tachycardic, white blood cell count up.  Now moved to the intensive care receiving volume resuscitation Plan Antibiotics as above Fluid challenge Lactic acid Might need to consider upsizing chest tube   Acute metabolic encephalopathy  -suspect in the context of sepsis but also c/b  his hearing loss.  Plan Supportive care Treat infection   Best practice (evaluated daily)  Per  primary  [place] ACNP-BC [place] Pager # [phone] OR # [phone] if no answer

## 2020-12-28 NOTE — Progress Notes (Signed)
Notified Lab that ABG being sent for analysis. 

## 2020-12-28 NOTE — Care Management Important Message (Signed)
Important Message  Patient Details IM Letter given to the Patient. Name: Casey Gates. MRN: [id number] Date of Birth: 01/19/1926   Medicare Important Message Given:  Yes     Kerin Salen 12/28/2020, 1:05 PM

## 2020-12-28 NOTE — Progress Notes (Signed)
Pharmacy Antibiotic Note  Casey Gates. is a 87 y.o. male admitted on 12/17/2020 with pneumonia.  He is s/p Rocephin + Zithromax course with worsening pleural effusion s/p thoracentesis with chest tube insertion 1/25, TPA/dornase instillation 1/26.  Pharmacy has been consulted for Cefepime + Vancomycin dosing.  12/28/2020: Day #4 broad spectrum abx -Tmax 101.3 -Leukocytosis worsening 37.3 -Renal function at baseline (CrCl 59ml/min)  Plan: Continue Cefepime 2gm IV q12h Continue Vancomycin 1gm IV q24h to target AUC 400-550 Check Vanc levels in 1-2 days if continues Monitor renal function and cx data    Height: 5\' 11"  (180.3 cm) Weight: 62.7 kg (138 lb 3.7 oz) IBW/kg (Calculated) : 75.3  Temp (24hrs), Avg:98.6 F (37 C), Min:97.3 F (36.3 C), Max:101.3 F (38.5 C)  Recent Labs  Lab 12/22/20 0528 12/23/20 0059 12/24/20 0545 12/25/20 0541 12/27/20 0548 12/28/20 0919 12/28/20 1140  WBC 21.2* 16.4* 18.6*  --  14.7* 37.3*  --   CREATININE  --   --   --  0.78 0.69 0.88  --   LATICACIDVEN  --   --   --   --   --  2.3* 1.0    Estimated Creatinine Clearance: 47.5 mL/min (by C-G formula based on SCr of 0.88 mg/dL).    Allergies  Allergen Reactions  . Coconut Flavor [Flavoring Agent] Hypertension    Anything that is related to coconut---LOSS OF CONSCIOUSNESS (SYNCOPE)  . Coconut Oil Other (See Comments)    Anything that is related to coconut--- LOSS OF CONSCIOUSNESS (SYNCOPE)  . Doxycycline Hyclate Hives  . Food Anaphylaxis    ALLERGIC TO ALL NUTS WITH THE EXCEPTION OF CASHEWS, ALMONDS  . Black [employer]  Other (See Comments)    Passes out ( can only eat almonds or peanuts)  . Coconut Oil Other (See Comments)    Faints   . Doxycycline Rash    Antimicrobials this admission: 1/16 CTX >> 1/23 1/16 azith> 1/20 1/24 Vanc>> 1/24 Cefepime>>  Dose adjustments this admission:  Microbiology results: 1/16 BCx: NGF 1/17 Pleural fluid: ngF 1/18 MRSA PCR + 1/25: chest  tube abscess: NGTD  1/27 BCx: 1/27 UCx:  Thank you for allowing pharmacy to be a part of this patient's care.  Peggyann Juba, PharmD, BCPS Pharmacy: [phone] 12/28/2020 2:08 PM

## 2020-12-28 NOTE — Progress Notes (Signed)
Pt transferred to SDU per Dr. Verlon Au. Report called Ubaldo Glassing, Therapist, sports. Son updated on plan of care at bedside.

## 2020-12-28 NOTE — Progress Notes (Signed)
eLink Physician-Brief Progress Note Patient Name: Casey Gates. DOB: Jan 24, 1926 MRN: [id number]   Date of Service  12/28/2020  HPI/Events of Note  Patient with Afib with RVR, his blood pressure has been soft and he is on 25 mcg of Phenylephrine with a recent BP of 108/43, MAP 67 mmHg.  eICU Interventions  Amiodarone 150 mg iv bolus x 1 ordered.        Frederik Pear 12/28/2020, 9:29 PM

## 2020-12-28 NOTE — Progress Notes (Signed)
Spoke with Dr. Darrick Meigs regarding need for foley catheter at this time as it was ordered for retention and had been placed over 72 hours ago. MD ordered to continue foley cathter at this time. Will continue to monitor.

## 2020-12-28 NOTE — Progress Notes (Signed)
Pt's manual BP 70/40's HR 90's. Dr. Verlon Au made aware. New orders placed for 250 saline bolus. Will continue to monitor.

## 2020-12-28 NOTE — Progress Notes (Signed)
Bladder scanner reading 322 mL, bladder feels distended upon palpation. In and out cath performed. 500 mL of clear, yellow urine received. Condom cath placed. Will continue to monitor at this time.

## 2020-12-28 NOTE — Progress Notes (Signed)
PROGRESS NOTE   Casey Gates.  VWU:[id number] DOB: November 05, 1926 DOA: 12/17/2020 PCP: Velna Hatchet, MD  Brief Narrative:   87 year old white male skilled nursing facility resident Prior GI stromal cell tumor and GI bleed/19/19 Chronic right-sided pleural effusion since that time Chronic pain on oxymorphone, restless leg syndrome, prior prostate cancer as well as Thoracic aortic aneurysm documented in the past OSA not on biPAP  Admit 2/95/6213 with metabolic encephalopathy heart rate 138 A. fib RVR-chest x-ray showed right lower lung opacity Eventually found to have partially loculated right pleural effusion which was exudative per thoracentesis on 1/17Treated with Rocephin and azithromycin Repeat x-rays showed worsening right-sided opacities-pulmonary consulted Underwent IR thoracentesis with chest tube  developed recurrent fever 1.27  Assessment & Plan:   Active Problems:   Multifocal pneumonia   Sepsis ([place])   Atrial fibrillation with RVR (HCC)   Acute metabolic encephalopathy   Hypernatremia   Empyema (HCC)   1. Sepsis , likely aspiration, but has foley a. Fever recurrence  b. Remove foley c. Get stat Lactic acid, Urine cult blood cult and monitor resp status closely d. Is DNR per my discussion with him 2. Loculated pleural effusion right side a. Chest tube placed 1/25-Pulmozyme placed x1 by pulmonary 1/26 b. Repeat imaging as per them 3. A. fib RVR CHADS2 score >4 a. Rate not controlled rates in the 120s b. Increased Cardizem dose to 90 mg every 6, continue metoprolol 12.5 bid c. Unsure of candidate for anticoagulation given possible high risk bleeding d. Apixaban 2.5 twice daily held-if no further procedures in the next several days can resume 4. Metabolic encephalopathy on admission a. Resolved 5. Prior GI stromal tumor and GI bleed a. Given bleeding in the past possibly a poor candidate for anticoagulation-needs outpatient discussion 6. Chronic  pain a. Opana 10 every 6 on hold b. hold gabapentin 1/27 7. OSA not on BiPAP 8. Dysphagia a. On dysphagia 3 diet thin liquids-careful and slow on diet b. Will need outpatient follow-up    DVT prophylaxis: Lovenox Code Status: DNR Family Communication: called and d/w son in detail--explained he has worsened some and likely might have aspirated Hopeful for recovery but patient is a DNR Disposition:  Status is: Inpatient  Remains inpatient appropriate because:Hemodynamically unstable, Persistent severe electrolyte disturbances and Ongoing diagnostic testing needed not appropriate for outpatient work up   Dispo: The patient is from: SNF              Anticipated d/c is to: SNF              Anticipated d/c date is: 2 days              Patient currently is not medically stable to d/c.   Difficult to place patient No  Consultants:   Pulmonary  Procedures: None  Antimicrobials: As above   Subjective:  Doing poorly today See RN notes --has fever  Sleepy but rousable No cp, diarr, cough chills    Objective: Vitals:   12/28/20 0641 12/28/20 0730 12/28/20 0800 12/28/20 0801  BP: (!) 108/54 (!) 95/54 100/64 100/64  Pulse: (!) 111 (!) 103 (!) 116 (!) 116  Resp: (!) 28 (!) 31 (!) 23 (!) 23  Temp: 99 F (37.2 C)   (!) 101.3 F (38.5 C)  TempSrc: Axillary   Rectal  SpO2: 92% 92% 92% 92%  Weight:      Height:        Intake/Output Summary (Last 24 hours) at 12/28/2020 [phone]  Last data filed at 12/28/2020 0836 Gross per 24 hour  Intake 1472.35 ml  Output 2720 ml  Net -1247.65 ml   Filed Weights   12/17/20 2323 12/19/20 0635 12/26/20 1112  Weight: 63.5 kg 57.7 kg 62.7 kg    Examination: Less coherent no distress EOMI NCAT-shallow breaths No icterus no pallor No JVD A. fib on monitors 100 range Decreased R sided AE Abdomen soft No lower extremity edema Moving all 4 limbs equally   Data Reviewed: I have personally reviewed following labs and imaging  studies  COVID-19 Labs  No results for input(s): DDIMER, FERRITIN, LDH, CRP in the last 72 hours.  Lab Results  Component Value Date   [place] NEGATIVE 12/17/2020   [place] NEGATIVE 07/27/2020     Radiology Studies: [facility] Chest Port 1 View  Result Date: 12/28/2020 CLINICAL DATA:  RIGHT-sided chest tube. EXAM: PORTABLE CHEST 1 VIEW COMPARISON:  December 25, 2020, December 27, 2020 FINDINGS: The cardiomediastinal silhouette is unchanged in contour.RIGHT-sided chest tube. Incomplete assessment of the RIGHT costophrenic angle. Moderate loculated RIGHT pleural effusion. This is similar in comparison to most recent prior but decreased since December 25, 2020. Decreased conspicuity of trace air component noted on CT. No LEFT-sided pneumothorax. Trace LEFT pleural effusion. Unchanged homogeneous opacification of the RIGHT lung base. Unchanged LEFT retrocardiac opacity, possibly atelectasis. Abdomen is out of the field of view. Multilevel degenerative changes of the thoracic spine. IMPRESSION: 1. Unchanged moderate RIGHT loculated pleural effusion since most recent prior. It is overall decreased since December 25, 2020 2. Unchanged LEFT retrocardiac opacity. Electronically Signed   By: Valentino Saxon MD   On: 12/28/2020 07:51   DG CHEST PORT 1 VIEW  Result Date: 12/27/2020 CLINICAL DATA:  Empyema. EXAM: PORTABLE CHEST 1 VIEW COMPARISON:  CT 12/25/2020. FINDINGS: Chest tube noted over right lower chest. Right pleural effusion/empyema again noted, decreased in size from prior exam. Persistent bibasilar atelectasis/infiltrates. Stable cardiomegaly. IMPRESSION: 1. Chest tube noted over the right lower chest. Right pleural effusion/empyema again noted, decreased in size from prior exam. 2.  Persistent bibasilar atelectasis/infiltrates. 3.  Stable cardiomegaly. Electronically Signed   By: Marcello Moores  Register   On: 12/27/2020 05:59   CT IMAGE GUIDED FLUID DRAIN BY CATHETER  Result Date:  12/27/2020 INDICATION: Loculated right-sided pleural effusion. Please perform CT-guided chest tube placement for infection source purposes and potentially for the administration intrapleural tPA. EXAM: CT IMAGE GUIDED FLUID DRAIN BY CATHETER COMPARISON:  None. MEDICATIONS: The patient is currently admitted to the hospital and receiving intravenous antibiotics. The antibiotics were administered within an appropriate time frame prior to the initiation of the procedure. ANESTHESIA/SEDATION: None CONTRAST:  None COMPLICATIONS: None immediate. PROCEDURE: Informed written consent was obtained from the patient after a discussion of the risks, benefits and alternatives to treatment. The patient was placed supine, slightly RPO on the CT gantry and a pre procedural CT was performed re-demonstrating the known complex partially loculated right-sided pleural effusion. An access site along the inferior lateral aspect of the right chest wall was marked and the procedure was planned. A timeout was performed prior to the initiation of the procedure. The skin overlying the inferior aspect the right lateral chest wall was prepped and draped in the usual sterile fashion. The overlying soft tissues were anesthetized with 1% lidocaine with epinephrine. Appropriate trajectory was planned with the use of a 22 gauge spinal needle. An 18 gauge trocar needle was advanced into the abscess/fluid collection and a short Amplatz super stiff wire was coiled  within the collection. Appropriate positioning was confirmed with a limited CT scan. The tract was serially dilated allowing placement of a 14 French all-purpose drainage catheter. Appropriate positioning was confirmed with a limited postprocedural CT scan. Approximately 20 cc of blood tinged serous pleural fluid was aspirated. The tube was connected to a pleura vac device and sutured in place. A dressing was placed. The patient tolerated the procedure well without immediate post procedural  complication. IMPRESSION: Successful CT guided placement of a 78 French all purpose drain catheter into the basilar aspect of the right pleural space with aspiration of 20 cc of blood tinged serous pleural fluid. Samples were sent to the laboratory as requested by the ordering clinical team. Electronically Signed   By: Sandi Mariscal M.D.   On: 12/27/2020 08:23     Scheduled Meds: . artificial tears  1 application Left Eye TID  . budesonide (PULMICORT) nebulizer solution  0.25 mg Nebulization BID  . Chlorhexidine Gluconate Cloth  6 each Topical Daily  . diltiazem  90 mg Oral Q6H  . feeding supplement  237 mL Oral BID BM  . ipratropium-albuterol  3 mL Nebulization TID  . metoprolol tartrate  12.5 mg Oral BID   Continuous Infusions: . ceFEPime (MAXIPIME) IV 2 g (12/28/20 0553)  . dextrose 5 % and 0.45% NaCl 40 mL/hr at 12/28/20 0015  . vancomycin 1,000 mg (12/27/20 1833)     LOS: 11 days    Time spent: [place], MD [facility] To contact the attending provider between 7A-7P or the covering provider during after hours 7P-7A, please log into the web site [email or web address] and access using universal [place] password for that web site. If you do not have the password, please call the hospital operator.  12/28/2020, 8:49 AM

## 2020-12-28 NOTE — Progress Notes (Signed)
PT Cancellation Note  Patient Details Name: Casey Gates. MRN: [id number] DOB: 1926/08/06   Cancelled Treatment:    Reason Eval/Treat Not Completed: Medical issues which prohibited therapy (pt is transferring to ICU due to hypotension. Will hold PT per RN recommendation. Will follow.)  Philomena Doheny PT 12/28/2020  Acute Rehabilitation Services Pager [phone] Office [phone]

## 2020-12-28 NOTE — Progress Notes (Signed)
   12/28/20 0801  Assess: MEWS Score  Temp (!) 101.3 F (38.5 C)  BP 100/64  Pulse Rate (!) 116  Resp (!) 23  Level of Consciousness Alert  SpO2 92 %  O2 Device Nasal Cannula  O2 Flow Rate (L/min) 2 L/min  Assess: MEWS Score  MEWS Temp 1  MEWS Systolic 1  MEWS Pulse 2  MEWS RR 1  MEWS LOC 0  MEWS Score 5  MEWS Score Color Red  Assess: if the MEWS score is Yellow or Red  Were vital signs taken at a resting state? Yes  Focused Assessment Change from prior assessment (see assessment flowsheet)  Early Detection of Sepsis Score *See Row Information* Low  MEWS guidelines implemented *See Row Information* Yes  Treat  MEWS Interventions Administered prn meds/treatments  Take Vital Signs  Increase Vital Sign Frequency  Red: Q 1hr X 4 then Q 4hr X 4, if remains red, continue Q 4hrs  Escalate  MEWS: Escalate Red: discuss with charge nurse/RN and provider, consider discussing with RRT  Notify: Charge Nurse/RN  Name of Charge Nurse/RN Notified Chancy Hurter, RN  Date Charge Nurse/RN Notified 12/28/20  Time Charge Nurse/RN Notified [id number]  Notify: Provider  Provider Name/Title Dr. Verlon Au  Date Provider Notified 12/28/20  Time Provider Notified [phone]  Notification Type Page  Notification Reason Change in status

## 2020-12-28 NOTE — Progress Notes (Signed)
Pharmacy Antibiotic Note  Casey Gates. is a 87 y.o. male admitted on 12/17/2020 with pneumonia.  He is s/p Rocephin + Zithromax course with worsening pleural effusion s/p thoracentesis with chest tube insertion 1/25, TPA/dornase instillation 1/26 and 1/27.  Pharmacy has been consulted for Vancomycin and to broaden from Cefepime to Meropenem dosing.  12/28/2020: Day #4 broad spectrum abx -Tmax 101.3 -Leukocytosis worsening 37.3 -Renal function at baseline (CrCl 43ml/min)  Plan:  Meropenem 1g IV q12h  Continue Vancomycin 1gm IV q24h to target AUC 400-550  Check Vanc levels in 1-2 days if continues  Monitor renal function and cx data    Height: 5\' 11"  (180.3 cm) Weight: 62.7 kg (138 lb 3.7 oz) IBW/kg (Calculated) : 75.3  Temp (24hrs), Avg:98.5 F (36.9 C), Min:97.3 F (36.3 C), Max:101.3 F (38.5 C)  Recent Labs  Lab 12/22/20 0528 12/23/20 0059 12/24/20 0545 12/25/20 0541 12/27/20 0548 12/28/20 0919 12/28/20 1140  WBC 21.2* 16.4* 18.6*  --  14.7* 37.3*  --   CREATININE  --   --   --  0.78 0.69 0.88  --   LATICACIDVEN  --   --   --   --   --  2.3* 1.0    Estimated Creatinine Clearance: 47.5 mL/min (by C-G formula based on SCr of 0.88 mg/dL).    Allergies  Allergen Reactions  . Coconut Flavor [Flavoring Agent] Hypertension    Anything that is related to coconut---LOSS OF CONSCIOUSNESS (SYNCOPE)  . Coconut Oil Other (See Comments)    Anything that is related to coconut--- LOSS OF CONSCIOUSNESS (SYNCOPE)  . Doxycycline Hyclate Hives  . Food Anaphylaxis    ALLERGIC TO ALL NUTS WITH THE EXCEPTION OF CASHEWS, ALMONDS  . Black [employer]  Other (See Comments)    Passes out ( can only eat almonds or peanuts)  . Coconut Oil Other (See Comments)    Faints   . Doxycycline Rash    Antimicrobials this admission: 1/16 CTX >> 1/23 1/16 azith> 1/20 1/24 Vanc>> 1/24 Cefepime>>1/27 1/27 Meropenem >>   Dose adjustments this admission:  Microbiology results: 1/16  BCx: NGF 1/17 Pleural fluid: ngF 1/18 MRSA PCR + 1/25: chest tube abscess: NGTD  1/27 BCx: 1/27 UCx:  Thank you for allowing pharmacy to be a part of this patient's care.  Gretta Arab PharmD, BCPS Clinical Pharmacist [facility] main pharmacy [phone] 12/28/2020 4:49 PM

## 2020-12-28 NOTE — Progress Notes (Signed)
CRITICAL VALUE ALERT  Critical Value:  Lactic Acid 2.3  Date & Time Notied:  12/28/2020 1020  Provider Notified: Dr. Verlon Au  Orders Received/Actions taken:

## 2020-12-28 NOTE — Plan of Care (Signed)
Discussed with family prior to leaving plan of care for the patient and he left at shift change with questions answered at this time.  Patient informed of voiding, pain management and heart rate with no evidence of learning at this time.  Problem: Health Behavior/Discharge Planning: Goal: Ability to manage health-related needs will improve Outcome: Not Progressing

## 2020-12-28 NOTE — Progress Notes (Signed)
SLP Cancellation Note  Patient Details Name: Casey Gates. MRN: [id number] DOB: August 10, 1926   Cancelled treatment:       Reason Eval/Treat Not Completed: Other (comment);Medical issues which prohibited therapy (pt now npo after red MEWS this am with fever, will continue effort, advise MBS when pt able to particiate if aggressive care cotinues to be indicated as pt has h/o oropharyngeal dysphagia with sensorimotor deficits)   [name] 12/28/2020, 9:05 AM  [name], MS [place] Office [phone] Pager [phone]

## 2020-12-29 ENCOUNTER — Inpatient Hospital Stay (HOSPITAL_COMMUNITY): Payer: Medicare PPO

## 2020-12-29 DIAGNOSIS — A419 Sepsis, unspecified organism: Secondary | ICD-10-CM | POA: Diagnosis not present

## 2020-12-29 DIAGNOSIS — I4891 Unspecified atrial fibrillation: Secondary | ICD-10-CM | POA: Diagnosis not present

## 2020-12-29 DIAGNOSIS — Z7189 Other specified counseling: Secondary | ICD-10-CM

## 2020-12-29 DIAGNOSIS — Z515 Encounter for palliative care: Secondary | ICD-10-CM | POA: Diagnosis not present

## 2020-12-29 DIAGNOSIS — J869 Pyothorax without fistula: Secondary | ICD-10-CM | POA: Diagnosis not present

## 2020-12-29 DIAGNOSIS — G9341 Metabolic encephalopathy: Secondary | ICD-10-CM | POA: Diagnosis not present

## 2020-12-29 LAB — BASIC METABOLIC PANEL
Anion gap: 8 (ref 5–15)
BUN: 16 mg/dL (ref 8–23)
CO2: 24 mmol/L (ref 22–32)
Calcium: 7.5 mg/dL — ABNORMAL LOW (ref 8.9–10.3)
Chloride: 108 mmol/L (ref 98–111)
Creatinine, Ser: 0.77 mg/dL (ref 0.61–1.24)
GFR, Estimated: >60 mL/min (ref >60)
Glucose, Bld: 100 mg/dL — ABNORMAL HIGH (ref 70–99)
Potassium: 3.5 mmol/L (ref 3.5–5.1)
Sodium: 140 mmol/L (ref 135–145)

## 2020-12-29 LAB — PHOSPHORUS: Phosphorus: 2.9 mg/dL (ref 2.5–4.6)

## 2020-12-29 LAB — CBC
HCT: 36.4 % — ABNORMAL LOW (ref 39.0–52.0)
Hemoglobin: 11.2 g/dL — ABNORMAL LOW (ref 13.0–17.0)
MCH: 28 pg (ref 26.0–34.0)
MCHC: 30.8 g/dL (ref 30.0–36.0)
MCV: 91 fL (ref 80.0–100.0)
Platelets: 425 10*3/uL — ABNORMAL HIGH (ref 150–400)
RBC: 4 MIL/uL — ABNORMAL LOW (ref 4.22–5.81)
RDW: 14.5 % (ref 11.5–15.5)
WBC: 16.2 10*3/uL — ABNORMAL HIGH (ref 4.0–10.5)
nRBC: 0 % (ref 0.0–0.2)

## 2020-12-29 LAB — URINE CULTURE: Culture: NO GROWTH

## 2020-12-29 LAB — PROCALCITONIN: Procalcitonin: 0.12 ng/mL

## 2020-12-29 MED ORDER — OXYCODONE HCL 5 MG PO TABS
5.0000 mg | ORAL_TABLET | Freq: Four times a day (QID) | ORAL | Status: DC | PRN
  Administered 2020-12-29 – 2021-01-09 (×12): 5 mg via ORAL
  Filled 2020-12-29 (×13): qty 1

## 2020-12-29 MED ORDER — FENTANYL CITRATE (PF) 100 MCG/2ML IJ SOLN
12.5000 ug | INTRAMUSCULAR | Status: DC | PRN
  Administered 2021-01-02: 12.5 ug via INTRAVENOUS
  Administered 2021-01-03 (×2): 25 ug via INTRAVENOUS
  Filled 2020-12-29 (×3): qty 2

## 2020-12-29 MED ORDER — DILTIAZEM HCL-DEXTROSE 125-5 MG/125ML-% IV SOLN (PREMIX)
5.0000 mg/h | INTRAVENOUS | Status: DC
  Administered 2020-12-29: 15 mg/h via INTRAVENOUS
  Administered 2020-12-29: 5 mg/h via INTRAVENOUS
  Filled 2020-12-29 (×2): qty 125

## 2020-12-29 MED ORDER — AMIODARONE LOAD VIA INFUSION: 150.0000 mg | Freq: Once | INTRAVENOUS | Status: DC

## 2020-12-29 MED ORDER — AMIODARONE HCL IN DEXTROSE 360-4.14 MG/200ML-% IV SOLN: 60.0000 mg/h | INTRAVENOUS | Status: DC

## 2020-12-29 MED ORDER — AMIODARONE HCL IN DEXTROSE 360-4.14 MG/200ML-% IV SOLN: 30.0000 mg/h | INTRAVENOUS | Status: DC

## 2020-12-29 MED ORDER — GABAPENTIN 100 MG PO CAPS
100.0000 mg | ORAL_CAPSULE | Freq: Three times a day (TID) | ORAL | Status: DC
  Administered 2020-12-29 – 2020-12-30 (×3): 100 mg via ORAL
  Filled 2020-12-29 (×3): qty 1

## 2020-12-29 MED ORDER — SODIUM CHLORIDE (PF) 0.9 % IJ SOLN
10.0000 mg | Freq: Once | INTRAMUSCULAR | Status: AC
  Administered 2020-12-29: 10 mg via INTRAPLEURAL
  Filled 2020-12-29: qty 10

## 2020-12-29 MED ORDER — METOPROLOL TARTRATE 5 MG/5ML IV SOLN
2.5000 mg | INTRAVENOUS | Status: DC | PRN
  Administered 2020-12-29 – 2020-12-30 (×2): 2.5 mg via INTRAVENOUS
  Administered 2020-12-30 (×3): 5 mg via INTRAVENOUS
  Filled 2020-12-29 (×5): qty 5

## 2020-12-29 MED ORDER — DILTIAZEM LOAD VIA INFUSION
5.0000 mg | Freq: Once | INTRAVENOUS | Status: AC
  Administered 2020-12-29: 5 mg via INTRAVENOUS
  Filled 2020-12-29: qty 5

## 2020-12-29 MED ORDER — STERILE WATER FOR INJECTION IJ SOLN
5.0000 mg | Freq: Once | RESPIRATORY_TRACT | Status: AC
  Administered 2020-12-29: 5 mg via INTRAPLEURAL
  Filled 2020-12-29: qty 5

## 2020-12-29 NOTE — Progress Notes (Addendum)
Daily Progress Note   Patient Name: Casey Gates.       Date: 12/29/2020 DOB: Sep 26, 1926  Age: 87 y.o. MRN#: [id number] Attending Physician: Brand Males, MD Primary Care Physician: Velna Hatchet, MD Admit Date: 12/17/2020  Reason for Consultation/Follow-up: Establishing goals of care  Subjective: Patient awake, alert, oriented. Working with speech therapy this afternoon. No shortness of breath or pain.   GOC:  Son, [name] and granddaughter, Judson Roch at bedside. Judson Roch is a Therapist, sports and currently in NP school. She has a good understanding of her grandfather's condition and importance of discussing his wishes this afternoon while he is alert and oriented. (This morning the patient mentioned to son [name] that he would be ok with ventilator if he could get better).   After SLP finished working with Mr. Jermarion, Haverty and I reviewed goals of care with him.  Updated on his condition including diagnoses, interventions, plan of care, transfer to ICU yesterday, and guarded prognosis. Clarified patient's wishes and medically recommended against CPR/life support if his condition should worsen.   Mr. Ahlborn is very clear that he does NOT want CPR and after further discussion, does not wish to be placed on life support machine if his condition deteriorated. He shares with his family and I, that he would not wish for his life to be prolonged in this current state (sick, weak, bedbound). If he is not able to improve this hospitalization, he states 'let me go.'  Mr. Evetts is ok with continuing current plan of care, medical management, watchful waiting. Reassured him that we would not continue on for weeks-months if he was not getting better. Explained that there would be further discussions regarding keeping  him comfortable and starting hospice services.   Son and granddaughter understand and respect his decisions. Reviewed MOST form with them and we may consider completing prior to discharge. They are appreciative of conversation. PMT contact information given and reassured of intermittent palliative support this admit.   Length of Stay: 12  Current Medications: Scheduled Meds:  . alteplase (TPA) for intrapleural administration  10 mg Intrapleural Once   And  . pulmozyme (DORNASE) for intrapleural administration  5 mg Intrapleural Once  . artificial tears  1 application Left Eye TID  . budesonide (PULMICORT) nebulizer solution  0.25 mg Nebulization BID  .  chlorhexidine  15 mL Mouth Rinse BID  . Chlorhexidine Gluconate Cloth  6 each Topical Daily  . feeding supplement  237 mL Oral BID BM  . ipratropium-albuterol  3 mL Nebulization TID  . mouth rinse  15 mL Mouth Rinse q12n4p    Continuous Infusions: . sodium chloride 125 mL/hr at 12/29/20 1301  . sodium chloride    . diltiazem (CARDIZEM) infusion 15 mg/hr (12/29/20 1301)  . meropenem (MERREM) IV Stopped (12/29/20 0530)  . phenylephrine (NEO-SYNEPHRINE) Adult infusion Stopped (12/29/20 1233)  . vancomycin Stopped (12/28/20 1743)    PRN Meds: acetaminophen **OR** acetaminophen, ondansetron **OR** ondansetron (ZOFRAN) IV, Resource ThickenUp Clear  Physical Exam Vitals and nursing note reviewed.  Constitutional:      General: He is awake.     Appearance: He is ill-appearing.     Comments: Frail, cachectic  HENT:     Head: Normocephalic and atraumatic.  Cardiovascular:     Rate and Rhythm: Normal rate.  Pulmonary:     Effort: No tachypnea, accessory muscle usage or respiratory distress.     Comments: Right chest tube Skin:    General: Skin is warm and dry.  Neurological:     Mental Status: He is alert and oriented to person, place, and time.  Psychiatric:        Mood and Affect: Mood normal.        Speech: Speech normal.         Behavior: Behavior normal.        Cognition and Memory: Cognition normal.            Vital Signs: BP 98/60   Pulse (!) 109   Temp 98.8 F (37.1 C)   Resp (!) 22   Ht 5\' 11"  (1.803 m)   Wt 65.4 kg   SpO2 97%   BMI 20.11 kg/m  SpO2: SpO2: 97 % O2 Device: O2 Device: Nasal Cannula O2 Flow Rate: O2 Flow Rate (L/min): 3 L/min  Intake/output summary:   Intake/Output Summary (Last 24 hours) at 12/29/2020 1304 Last data filed at 12/29/2020 1301 Gross per 24 hour  Intake 5975.08 ml  Output 3130 ml  Net 2845.08 ml   LBM: Last BM Date: 12/28/20 Baseline Weight: Weight: 63.5 kg Most recent weight: Weight: 65.4 kg       Palliative Assessment/Data: PPS: 30%    Flowsheet Rows   Flowsheet Row Most Recent Value  Intake Tab   Referral Department Hospitalist  Unit at Time of Referral ER  Palliative Care Primary Diagnosis Pulmonary  Date Notified 12/18/20  Palliative Care Type New Palliative care  Reason for referral Clarify Goals of Care  Date of Admission 12/17/20  Date first seen by Palliative Care 12/20/20  # of days Palliative referral response time 2 Day(s)  # of days IP prior to Palliative referral 1  Clinical Assessment   Psychosocial & Spiritual Assessment   Palliative Care Outcomes       Patient Active Problem List   Diagnosis Date Noted  . Empyema ([place])   . Atrial fibrillation with RVR ([place]) 12/17/2020  . Acute metabolic encephalopathy 57/32/2025  . Hypernatremia 12/17/2020  . Coagulation defect ([place]) 09/27/2020  . Back pain, chronic 12/14/2018  . Edema 12/14/2018  . Prostate cancer ([place]) 12/14/2018  . History of amputation of lesser toe ([place]) 11/16/2018  . Chronic ulcer of right foot with fat layer exposed ([place]) 11/05/2018  . Contusion of right hip region 07/24/2018  . Multifocal pneumonia 03/18/2018  . Sepsis ([place])  03/18/2018  . Malignant gastrointestinal stromal tumor (GIST) of stomach ([place]) 03/12/2018  . Urinary retention 02/24/2018  .  Gastrointestinal stromal tumor (GIST) of stomach ([place])   . Restless leg syndrome   . Acute GI bleeding 02/16/2018  . Acute blood loss anemia 02/16/2018  . Chronic low back pain 02/05/2018  . Degeneration of lumbar intervertebral disc 02/05/2018  . Constipation 01/20/2018  . Exertional shortness of breath 12/30/2017  . Rupture of left quadriceps muscle 07/07/2017  . Rupture of left quadriceps tendon 06/26/2017  . Eczema 06/26/2017  . Chest pain 07/29/2015  . OSA on CPAP 07/10/2015  . CPAP use counseling 07/10/2015  . Excessive daytime sleepiness 02/22/2015  . Sleep apnea 02/22/2015  . Snoring 02/22/2015  . Nocturia more than twice per night 02/22/2015  . Syncope 12/31/2012  . Bifascicular bundle branch block 12/31/2012  . Cellulitis and abscess of leg, except foot 12/04/2012  . Chronic pain 12/04/2012  . Kyphosis 12/04/2012  . H/O unilateral nephrectomy 12/04/2012  . H/O prostate cancer 12/04/2012  . S/P TURP 12/04/2012    Palliative Care Assessment & Plan   Patient Profile: 87 y.o. male  with past medical history of atrial fibrillation, GIST tumor of stomach (s/p partial gastrectomy, tx'd with Gleevec- last CT showed no evidence of recurrence), R nephrectomy, L eye blindness, CKD, chronic pain, iron deficient anemia, chronic constipation, depression with decreased appetite, dysphagia, living at assisted living facility [facility]), recent COVID-19 infection at the end of December- admitted on 12/17/2020 with increased confusion.  Workup revealed sepsis d/t pneumonia with loculated pleural effusion and a fib with RVR. Underwent thoracentesis yielding 225 cc fluid. HR improving with cardizem infusion. He remains confused. Has decreased oral intake- SLP evaluation shows chronic dysphagia, weak cough, but no overt signs of aspiration- recommendations for dysphagia management made. Palliative medicine consulted for goals of care.     1/26 Cognitive and nutritional status improving.  On 1/25, IR chest tube placement due to worsening right sided loculated effusion. Plan is for fibrinolytics 12/27/20 and monitor output.  Assessment Sepsis secondary to left lower lobe pneumonia, resolved Loculated right pleural effusion Afib RVR Hypernatremia, resolved Metabolic encephalopathy, improved OSA  Recommendations/Plan  Continue current plan of care and medical management.  Confirmed with patient on 1/28 his wish for DNR/DNI code status if his condition decompensates.   Continue PT/OT/SLP efforts. Likely will need SNF rehab if improving and stable for discharge.  Ongoing palliative discussions pending clinical course. Son and granddaughter have PMT contact information.   Goals of Care and Additional Recommendations:  Limitations on Scope of Treatment: Full Scope Treatment  Code Status:  DNR/DNI  Prognosis:  Guarded  Discharge Planning:  To Be Determined  Thank you for allowing the Palliative Medicine Team to assist in the care of this patient.  Total time: 40  Greater than 50% of this time was spent counseling and coordinating care related to the above assessment and plan.   Ihor Dow, DNP, FNP-C Palliative Medicine Team  Phone: [phone] Fax: [phone]  Please contact Palliative Medicine Team phone at [phone] for questions and concerns.

## 2020-12-29 NOTE — Progress Notes (Addendum)
   3rd dose of intrapleural fibrinolytic just administered  Plan -Close drainage for 1 hour and then open it    SIGNATURE    Dr. Brand Males, M.D., F.C.C.P,  Pulmonary and Critical Care Medicine Staff Physician, [place] Director - Interstitial Lung Disease  Program  Pulmonary [place] [facility] [place], [place], [address]  Pager: [phone], If no answer  OR between  19:00-7:00h: page [phone] Telephone (clinical office): [phone] Telephone (research): [phone]  1:44 PM 12/29/2020

## 2020-12-29 NOTE — Progress Notes (Signed)
E-link notified again that Amiodarone bolus didn't lower the patients heart rate still in the 130's.

## 2020-12-29 NOTE — Progress Notes (Signed)
Chaplain followed up with pt per request of yesterday's Chaplain.  Chaplain engaged in conversation with pt, his son and granddaughter.  Chaplain offered ministry of presence.  Chaplain and pt agreed it's a blessing to have family members who care for [place] and support [place] in our later years.    [place]

## 2020-12-29 NOTE — Progress Notes (Signed)
RN changed Sarahara chest tube system out- Noted to be overfilled into water seal chamber.

## 2020-12-29 NOTE — Progress Notes (Signed)
Physical Therapy Treatment Patient Details Name: Casey Gates. MRN: [id number] DOB: [birth year] Today's Date: 12/29/2020    History of Present Illness Patient is a 87 year old male PMH of A. fib, CKD, chronic pain, OSA (does not use BiPAP) who presented to the hospital 12/17/2020 by son for altered mental status. Per chart had COVID end of Dec 2021    PT Comments    Pt very motivated this pm and assisted to EOB with increased time and assist but limited to sitting EOB with standing deferred 2* drop in BP to 78/43.   Follow Up Recommendations  SNF     Equipment Recommendations  Wheelchair cushion (measurements PT);Wheelchair (measurements PT)    Recommendations for Other Services       Precautions / Restrictions Precautions Precautions: Fall Precaution Comments: aspiration, chest tube right side Restrictions Weight Bearing Restrictions: No    Mobility  Bed Mobility Overal bed mobility: Needs Assistance Bed Mobility: Supine to Sit;Sit to Supine     Supine to sit: HOB elevated;Min assist;Mod assist;+2 for physical assistance;+2 for safety/equipment Sit to supine: Min assist;Mod assist;+2 for physical assistance;+2 for safety/equipment   General bed mobility comments: assist required for LE management and to control trunk.  Transitions completed wtih pad  Transfers                 General transfer comment: deferred with drop in BP to 78/43  Ambulation/Gait                 Stairs             Wheelchair Mobility    Modified Rankin (Stroke Patients Only)       Balance Overall balance assessment: Needs assistance Sitting-balance support: Feet supported;Single extremity supported Sitting balance-Leahy Scale: Fair Sitting balance - Comments: min guard at side of bed Postural control: Posterior lean;Left lateral lean                                  Cognition Arousal/Alertness: Awake/alert Behavior During Therapy: WFL for  tasks assessed/performed Overall Cognitive Status: Within Functional Limits for tasks assessed                                        Exercises      General Comments        Pertinent Vitals/Pain Pain Assessment: Faces Faces Pain Scale: Hurts a little bit Pain Location: Rt side (CT site) Pain Descriptors / Indicators: Guarding;Grimacing Pain Intervention(s): Limited activity within patient's tolerance;Monitored during session    Home Living                      Prior Function            PT Goals (current goals can now be found in the care plan section) Acute Rehab PT Goals Patient Stated Goal: To walk and return to apartment Progress towards PT goals: Progressing toward goals    Frequency    Min 2X/week      PT Plan Current plan remains appropriate    Co-evaluation              AM-PAC PT "6 Clicks" Mobility   Outcome Measure  Help needed turning from your back to your side while in a flat bed without using bedrails?: A Lot  Help needed moving from lying on your back to sitting on the side of a flat bed without using bedrails?: A Lot Help needed moving to and from a bed to a chair (including a wheelchair)?: Total Help needed standing up from a chair using your arms (e.g., wheelchair or bedside chair)?: Total Help needed to walk in hospital room?: Total Help needed climbing 3-5 steps with a railing? : Total 6 Click Score: 8    End of Session Equipment Utilized During Treatment: Oxygen Activity Tolerance: Other (comment) (orthostatic) Patient left: in bed;with call bell/phone within reach;with bed alarm set Nurse Communication: Mobility status (BP) PT Visit Diagnosis: Muscle weakness (generalized) (M62.81);Unsteadiness on feet (R26.81)     Time: 3893-7342 PT Time Calculation (min) (ACUTE ONLY): 25 min  Charges:  $Therapeutic Activity: 23-37 mins                     Debe Coder PT Acute Rehabilitation Services Pager  [phone] Office [phone]    Loyola Santino 12/29/2020, 5:17 PM

## 2020-12-29 NOTE — Progress Notes (Signed)
PHARMACY NOTE:  ANTIMICROBIAL RENAL DOSAGE ADJUSTMENT  Current antimicrobial regimen includes a mismatch between antimicrobial dosage and estimated renal function. As per policy approved by the Pharmacy & Therapeutics and Medical Executive Committees, the antimicrobial dosage will be adjusted accordingly.  Current antimicrobial and dosage:  meropenem 1g q12 hr  Indication: pneumonia  Renal Function:   Estimated Creatinine Clearance: 54.5 mL/min (by C-G formula based on SCr of 0.77 mg/dL). []      On intermittent HD, scheduled: []      On CRRT    Antimicrobial dosage has been changed to:  q8 hr   Thank you for allowing pharmacy to be a part of this patient's care.  Reuel Boom, PharmD, BCPS [phone] 12/29/2020, 9:24 PM

## 2020-12-29 NOTE — Progress Notes (Signed)
Called E-link for beta blocker lopressor one-time dose for HR 105-125's.

## 2020-12-29 NOTE — Progress Notes (Signed)
eLink Physician-Brief Progress Note Patient Name: Casey Gates. DOB: 1926-05-31 MRN: [id number]   Date of Service  12/29/2020  HPI/Events of Note  Afib with RVR that did not respond to an Amiodarone 150 mg iv bolus.  eICU Interventions  Cardizem 5 mg iv bolus followed by Cardizem infusion ordered.        Casey Gates 12/29/2020, 1:20 AM

## 2020-12-29 NOTE — Progress Notes (Signed)
  Speech Language Pathology Treatment: Dysphagia  Patient Details Name: Casey Gates. MRN: [id number] DOB: Mar 05, 1926 Today's Date: 12/29/2020 Time: 8616-8372 SLP Time Calculation (min) (ACUTE ONLY): 57 min  Assessment / Plan / Recommendation Clinical Impression  Pt required extensive oral care including use of oral suction and toothette.  Large amount of whitish secretions removed from posterior palate.  Voice is dysphonic and cough is weak but no indication of aspiration noted with all po observed.  Pt consumed water, apple juice 4 ounces, applesauce 2 ounces, graham crackers x2 with full assist from SLP.  Pt benefited from max cues to take small single boluses when provided with cup independently.  Family present including son and granddaughter and all were educated to chronicity of aspiration risk due to premorbid dysphagia dating back to 2014.  Reviewed prior swallow studies with pt and family.    Advised do not recommend MBS as family/pt agreeable to po with aspiration mitigation strategies and pt remains DNR. Advised MBS would likely not change outcomes given chronicity of dysphagia and suspected ongoing aspiration.  .  Pt and family agreeable to plan for po diet. Educated thoroughly to assuring providing po only when pt fully alert and not dyspneic.  Informed pt of need to cough and expectorate if reflexively coughing. Using teach back, all education completed - no follow up.   HPI HPI: 87 year old male with medical history of gastric cancer, afib on anticoagulation, OSA, and chronic pain syndrome, COVID previously.   He presented to the ED due to AMS. CXR in the ED showed RLL opacity. CT chest showed patchy peribronchovascular opacity suspicious for PNA, small to moderate-sized partly loculated R pleural effusion, and significant atelectasis of the RLL, with a smaller area of atelectasis/scarring of the R middle lobe - s/p thoracentesis.  Pt with decreased attention/cognition per son's  information shared with MD.  He has h/o dysphagia dating back to 2014- with rec for double swallows, chin tuck due to decreased UES opening. Esophagram completed showed UES spasm, persistent tertiary contractions and hiatal hernia. Swallow eval ordered.      SLP Plan  All goals met       Recommendations  Diet recommendations: Dysphagia 3 (mechanical soft);Thin liquid Liquids provided via: Cup;Teaspoon;Straw Medication Administration: Whole meds with puree Compensations: Slow rate;Small sips/bites Postural Changes and/or Swallow Maneuvers: Out of bed for meals;Seated upright 90 degrees                Oral Care Recommendations: Oral care QID SLP Visit Diagnosis: Dysphagia, oropharyngeal phase (R13.12);Dysphagia, pharyngoesophageal phase (R13.14) Plan: All goals met       GO                Casey Gates 12/29/2020, 1:04 PM  Casey Lime, MS [facility] SLP Acute Rehab Services Office [phone] Pager [phone]

## 2020-12-29 NOTE — Progress Notes (Signed)
NAME:  Casey Herberg., MRN:  [id number], DOB:  [birth year], LOS: 12 ADMISSION DATE:  12/17/2020, CONSULTATION DATE: 1/24 REFERRING MD:  Darrick Meigs , CHIEF COMPLAINT:  Exudative pleural effusion    Brief History:  87 year old male initially admitted on 1/16 healthcare associated pneumonia and associated right-sided loculated pleural effusion.  Completed broad-spectrum antibiotic course and in spite of appropriate therapy developed worsening loculated right pleural effusion and evolution of air/gas fluid level.  Critical care asked to evaluate  Past Medical History:  Obstructive sleep apnea, atrial fibrillation on chronic anticoagulation, chronic pain syndrome. Significant Hospital Events:  1/16 admitted, started on broad-spectrum antibiotics 1/ 17: Exudative pleural effusion by thoracentesis, 225 cc of hazy amber fluid removed 1/18 cardiology consulted for atrial fibrillation with RVR 1/19 azithromycin completed 1/23: Ceftriaxone completed 1/24: Pulmonary asked to evaluate given worsening loculated right pleural effusion 12/27/2020 right chest tube with Cathflo Activase 10 mg Pulmozyme 5 mg instilled. 1/27: Moved to the intensive care for hypotension phenylephrine started.  Second round of Pulmozyme and TPA administered 1/28 third round of TPA administered.  Still on pressors but looks much better.  Getting out of bed, resuming diet. Consults:    Procedures:  thora 1/17   - LDH 1318, 97% polys  Significant Diagnostic Tests:  CT chest 1/24: 1. Moderate to large loculated right pleural effusion is increased in volume when compared with 12/17/2020. New focus of gas with air-fluid level is noted within the posterior component of the effusion. 2. Progressive atelectasis of the entire right lower lobe with partial atelectasis of the right middle lobe and right upper lobe. 3. Progressive consolidation involving the posteromedial left lower lobe containing air bronchograms. 4. Similar  appearance of patchy areas of ground-glass and airspace density within the anterior left upper lobe. Likely post infectious or inflammatory in etiology. 5. Aortic atherosclerosis. Coronary artery calcifications.  Micro Data:    Antimicrobials:   1/16 azith X 5 1/16  -> 1/23 1/24 vanc and cefepime>>> Cefepime stopped 1/27 changed to meropenem Interim History / Subjective:  Looks better today  Objective   Blood pressure 98/60, pulse (Abnormal) 109, temperature 98.8 F (37.1 C), resp. rate (Abnormal) 22, height 5\' 11"  (1.803 m), weight 65.4 kg, SpO2 97 %.        Intake/Output Summary (Last 24 hours) at 12/29/2020 1059 Last data filed at 12/29/2020 0600 Gross per 24 hour  Intake 4768.96 ml  Output 3130 ml  Net 1638.96 ml   Filed Weights   12/19/20 0635 12/26/20 1112 12/29/20 0615  Weight: 57.7 kg 62.7 kg 65.4 kg   Exam:   General frail 87 year old white male is resting in bed he is in no acute distress and looks much more awake today than compared to yesterday HEENT cephalic atraumatic poor dentition mucous membranes dry Pulmonary: Coarse scattered rhonchi bilaterally, diminished on the right, still purulent appearing output from the right chest tube Cardiac: Regular rhythm Abdomen: Soft nontender Extremities: Warm dry Neuro: Today he is awake, mentation much clear, he is hard of hearing at baseline.  Moves all extremities but is generally weak.  Resolved Hospital Problem list     Assessment & Plan:   Rt sided loculated effusion s/p chest tube by IR 12/26/20 S/p fibrinolytics 1/26 and 1/27-->has filled entire [place]  PCXR w/ improved aeration but still sig area of loculation RUL Plan Repeats tpa/dnase today  Cont ct to sxn Am cxr OOB Flutter  Day 5 vanc/day 2 meropenem  Repeat MRSA PCR, can prob stop vanc  SIRS/sepsis/septic shock -Spiking fever, tachycardic, white blood cell count up.still on low dose pressors Plan abx as above Cont IV hydration  Tele   Hold diuretics and antihypertensives   Acute metabolic encephalopathy  -suspect in the context of sepsis but also c/b his hearing loss. -->better today c/w exam on 1/27 Plan Supportive care    Best practice (evaluated daily)  Diet: dysphagia diet  Code status: NPO Activity: OOB  My cct 34 min  [place] ACNP-BC [place] Pager # [phone] OR # [phone] if no answer

## 2020-12-30 ENCOUNTER — Inpatient Hospital Stay (HOSPITAL_COMMUNITY): Payer: Medicare PPO

## 2020-12-30 DIAGNOSIS — I4819 Other persistent atrial fibrillation: Secondary | ICD-10-CM | POA: Diagnosis not present

## 2020-12-30 DIAGNOSIS — R579 Shock, unspecified: Secondary | ICD-10-CM | POA: Diagnosis not present

## 2020-12-30 DIAGNOSIS — I4891 Unspecified atrial fibrillation: Secondary | ICD-10-CM | POA: Diagnosis not present

## 2020-12-30 DIAGNOSIS — J869 Pyothorax without fistula: Secondary | ICD-10-CM | POA: Diagnosis not present

## 2020-12-30 LAB — CBC
HCT: 35.2 % — ABNORMAL LOW (ref 39.0–52.0)
Hemoglobin: 10.9 g/dL — ABNORMAL LOW (ref 13.0–17.0)
MCH: 27.8 pg (ref 26.0–34.0)
MCHC: 31 g/dL (ref 30.0–36.0)
MCV: 89.8 fL (ref 80.0–100.0)
Platelets: 434 10*3/uL — ABNORMAL HIGH (ref 150–400)
RBC: 3.92 MIL/uL — ABNORMAL LOW (ref 4.22–5.81)
RDW: 14.5 % (ref 11.5–15.5)
WBC: 15.5 10*3/uL — ABNORMAL HIGH (ref 4.0–10.5)
nRBC: 0 % (ref 0.0–0.2)

## 2020-12-30 LAB — COMPREHENSIVE METABOLIC PANEL
ALT: 27 U/L (ref 0–44)
AST: 35 U/L (ref 15–41)
Albumin: 1.5 g/dL — ABNORMAL LOW (ref 3.5–5.0)
Alkaline Phosphatase: 68 U/L (ref 38–126)
Anion gap: 6 (ref 5–15)
BUN: 18 mg/dL (ref 8–23)
CO2: 23 mmol/L (ref 22–32)
Calcium: 7.6 mg/dL — ABNORMAL LOW (ref 8.9–10.3)
Chloride: 110 mmol/L (ref 98–111)
Creatinine, Ser: 0.7 mg/dL (ref 0.61–1.24)
GFR, Estimated: >60 mL/min (ref >60)
Glucose, Bld: 91 mg/dL (ref 70–99)
Potassium: 3.6 mmol/L (ref 3.5–5.1)
Sodium: 139 mmol/L (ref 135–145)
Total Bilirubin: 0.4 mg/dL (ref 0.3–1.2)
Total Protein: 4.5 g/dL — ABNORMAL LOW (ref 6.5–8.1)

## 2020-12-30 LAB — ECHOCARDIOGRAM COMPLETE
Area-P 1/2: 3.08 cm2
Height: 71 in
P 1/2 time: 352 msec
S' Lateral: 2.7 cm
Weight: 2306.89 oz

## 2020-12-30 LAB — GLUCOSE, CAPILLARY: Glucose-Capillary: 106 mg/dL — ABNORMAL HIGH (ref 70–99)

## 2020-12-30 LAB — HEMOGLOBIN A1C
Hgb A1c MFr Bld: 5.9 % — ABNORMAL HIGH (ref 4.8–5.6)
Mean Plasma Glucose: 122.63 mg/dL

## 2020-12-30 LAB — CORTISOL: Cortisol, Plasma: 16.1 ug/dL

## 2020-12-30 LAB — PROCALCITONIN: Procalcitonin: 0.1 ng/mL

## 2020-12-30 LAB — PHOSPHORUS: Phosphorus: 2.5 mg/dL (ref 2.5–4.6)

## 2020-12-30 LAB — HEPARIN LEVEL (UNFRACTIONATED)
Heparin Unfractionated: 0.19 IU/mL — ABNORMAL LOW (ref 0.30–0.70)
Heparin Unfractionated: 0.2 IU/mL — ABNORMAL LOW (ref 0.30–0.70)

## 2020-12-30 LAB — MAGNESIUM: Magnesium: 2 mg/dL (ref 1.7–2.4)

## 2020-12-30 LAB — VANCOMYCIN, PEAK: Vancomycin Pk: 22 ug/mL — ABNORMAL LOW (ref 30–40)

## 2020-12-30 LAB — TSH: TSH: 9.841 u[IU]/mL — ABNORMAL HIGH (ref 0.350–4.500)

## 2020-12-30 LAB — APTT: aPTT: 33 seconds (ref 24–36)

## 2020-12-30 MED ORDER — HEPARIN (PORCINE) 25000 UT/250ML-% IV SOLN
1200.0000 [IU]/h | INTRAVENOUS | Status: DC
  Administered 2020-12-30: 1000 [IU]/h via INTRAVENOUS
  Administered 2021-01-01: 1200 [IU]/h via INTRAVENOUS
  Filled 2020-12-30 (×3): qty 250

## 2020-12-30 MED ORDER — LIP MEDEX EX OINT: TOPICAL_OINTMENT | CUTANEOUS | Status: DC | PRN

## 2020-12-30 MED ORDER — HYDROCORTISONE NA SUCCINATE PF 100 MG IJ SOLR
50.0000 mg | Freq: Four times a day (QID) | INTRAMUSCULAR | Status: DC
  Administered 2020-12-30 – 2021-01-04 (×19): 50 mg via INTRAVENOUS
  Filled 2020-12-30 (×18): qty 2

## 2020-12-30 MED ORDER — LIP MEDEX EX OINT
TOPICAL_OINTMENT | CUTANEOUS | Status: AC
  Filled 2020-12-30: qty 7

## 2020-12-30 MED ORDER — SODIUM CHLORIDE 0.9 % IV SOLN
1.0000 g | Freq: Three times a day (TID) | INTRAVENOUS | Status: DC
  Administered 2020-12-30 – 2021-01-05 (×18): 1 g via INTRAVENOUS
  Filled 2020-12-30 (×20): qty 1

## 2020-12-30 MED ORDER — POTASSIUM CHLORIDE 20 MEQ PO PACK
40.0000 meq | PACK | Freq: Once | ORAL | Status: AC
  Administered 2020-12-30: 40 meq via ORAL
  Filled 2020-12-30: qty 2

## 2020-12-30 MED ORDER — AMIODARONE HCL IN DEXTROSE 360-4.14 MG/200ML-% IV SOLN
60.0000 mg/h | INTRAVENOUS | Status: AC
  Administered 2020-12-30 (×2): 60 mg/h via INTRAVENOUS

## 2020-12-30 MED ORDER — INSULIN ASPART 100 UNIT/ML ~~LOC~~ SOLN
0.0000 [IU] | Freq: Three times a day (TID) | SUBCUTANEOUS | Status: DC
  Administered 2020-12-30 – 2021-01-05 (×8): 2 [IU] via SUBCUTANEOUS

## 2020-12-30 MED ORDER — IPRATROPIUM-ALBUTEROL 0.5-2.5 (3) MG/3ML IN SOLN: 3.0000 mL | RESPIRATORY_TRACT | Status: DC | PRN

## 2020-12-30 MED ORDER — AMIODARONE HCL IN DEXTROSE 360-4.14 MG/200ML-% IV SOLN
30.0000 mg/h | INTRAVENOUS | Status: DC
  Administered 2020-12-31 – 2021-01-02 (×5): 30 mg/h via INTRAVENOUS
  Filled 2020-12-30 (×7): qty 200

## 2020-12-30 NOTE — Progress Notes (Signed)
ANTICOAGULATION CONSULT NOTE - Initial Consult  Pharmacy Consult for heparin Indication: atrial fibrillation  Allergies  Allergen Reactions  . Coconut Flavor [Flavoring Agent] Hypertension    Anything that is related to coconut---LOSS OF CONSCIOUSNESS (SYNCOPE)  . Coconut Oil Other (See Comments)    Anything that is related to coconut--- LOSS OF CONSCIOUSNESS (SYNCOPE)  . Doxycycline Hyclate Hives  . Food Anaphylaxis    ALLERGIC TO ALL NUTS WITH THE EXCEPTION OF CASHEWS, ALMONDS  . Black [employer]  Other (See Comments)    Passes out ( can only eat almonds or peanuts)  . Coconut Oil Other (See Comments)    Faints   . Doxycycline Rash    Patient Measurements: Height: 5\' 11"  (180.3 cm) Weight: 65.4 kg (144 lb 2.9 oz) IBW/kg (Calculated) : 75.3 Heparin Dosing Weight: 65.4 kg  Vital Signs: Temp: 99.68 F (37.6 C) (01/29 0800) Temp Source: Bladder (01/29 0800) BP: 89/67 (01/29 0800) Pulse Rate: 114 (01/29 0800)  Labs: Recent Labs    12/28/20 1720 12/29/20 1336 12/30/20 0219  HGB 11.6* 11.2* 10.9*  HCT 38.0* 36.4* 35.2*  PLT 334 425* 434*  LABPROT 17.0*  --   --   INR 1.4*  --   --   CREATININE 0.91 0.77 0.70  TROPONINIHS 14  --   --     Estimated Creatinine Clearance: 54.5 mL/min (by C-G formula based on SCr of 0.7 mg/dL).   Medical History: Past Medical History:  Diagnosis Date  . Anemia   . Asthma    AS A CHILD  . Blind left eye   . Cancer [facility])    PROSTATE CANCER  . Cellulitis    frequently, "all over"  . Chronic pain syndrome   . Chronic ulcer of toe, right, with unspecified severity ([place])    2nd toe  . Constipation   . DDD (degenerative disc disease), lumbar   . Gastrointestinal stromal tumor (GIST) of stomach ([place])   . GI bleeding   . History of blood transfusion    01/2018  . Insomnia   . Kyphoscoliosis   . Mass in the abdomen 01/2018  . Persistent atrial fibrillation ([place])   . Restless leg   . Sleep apnea    does not wear CPAP  .  Wears glasses     Medications:  Medications Prior to Admission  Medication Sig Dispense Refill Last Dose  . apixaban (ELIQUIS) 2.5 MG TABS tablet Take 1 tablet (2.5 mg total) by mouth 2 (two) times daily. 60 tablet 3 12/17/2020 at 0800  . diltiazem (CARDIZEM SR) 120 MG 12 hr capsule Take 1 capsule (120 mg total) by mouth 2 (two) times daily. 60 capsule 5 12/17/2020 at 0800  . gabapentin (NEURONTIN) 300 MG capsule Take 300 mg by mouth 3 (three) times daily as needed (legs-per MAR). Restless leg   unknown  . mirtazapine (REMERON) 30 MG tablet Take 30 mg by mouth at bedtime.   12/16/2020 at 2000  . MOVANTIK 25 MG TABS tablet Take 25 mg by mouth daily.   12/17/2020 at 0800  . oxymorphone (OPANA) 10 MG tablet Take 10 mg by mouth every 6 (six) hours as needed for pain.    12/17/2020 at Unknown time  . White Petrolatum-Mineral Oil (ARTIFICIAL TEARS) ointment Place 1 application into the left eye 3 (three) times daily. Left lower eye lid   12/17/2020 at 0800    Assessment: Pharmacy consulted to dose heparin for this patient who had previously been on apixaban for atrial  fibrillation.  Last dose of apixaban was 12/25/2020 at 1122 and was held for thoracentesis and possible other procedures.  Age > 89 years and history of anemia, prostate cancer, GI bleeding, and blood transfusion. Baseline heparin level 0.19 and aPTT 33 which correlate.  Goal of Therapy:  Heparin level 0.3-0.7 units/ml aPTT 66-102 seconds Monitor platelets by anticoagulation protocol: Yes   Plan:  No heparin bolus Heparin 1000 units/hour 8-hour heparin level Monitor daily heparin level, CBC, s/s bleeding   Efraim Kaufmann, PharmD, BCPS 12/30/2020,12:34 PM

## 2020-12-30 NOTE — Progress Notes (Signed)
  Amiodarone Drug - Drug Interaction Consult Note  Recommendations: No drug interactions found with amiodarone.  Will review medication list daily for any new drug interactions with amiodarone.  Amiodarone is metabolized by the cytochrome P450 system and therefore has the potential to cause many drug interactions. Amiodarone has an average plasma half-life of 50 days (range 20 to 100 days).   There is potential for drug interactions to occur several weeks or months after stopping treatment and the onset of drug interactions may be slow after initiating amiodarone.   []  Statins: Increased risk of myopathy. Simvastatin- restrict dose to 20mg  daily. Other statins: counsel patients to report any muscle pain or weakness immediately.  []  Anticoagulants: Amiodarone can increase anticoagulant effect. Consider warfarin dose reduction. Patients should be monitored closely and the dose of anticoagulant altered accordingly, remembering that amiodarone levels take several weeks to stabilize.  []  Antiepileptics: Amiodarone can increase plasma concentration of phenytoin, the dose should be reduced. Note that small changes in phenytoin dose can result in large changes in levels. Monitor patient and counsel on signs of toxicity.  []  Beta blockers: increased risk of bradycardia, AV block and myocardial depression. Sotalol - avoid concomitant use.  []   Calcium channel blockers (diltiazem and verapamil): increased risk of bradycardia, AV block and myocardial depression.  []   Cyclosporine: Amiodarone increases levels of cyclosporine. Reduced dose of cyclosporine is recommended.  []  Digoxin dose should be halved when amiodarone is started.  []  Diuretics: increased risk of cardiotoxicity if hypokalemia occurs.  []  Oral hypoglycemic agents (glyburide, glipizide, glimepiride): increased risk of hypoglycemia. Patient's glucose levels should be monitored closely when initiating amiodarone therapy.   []  Drugs that  prolong the QT interval:  Torsades de pointes risk may be increased with concurrent use - avoid if possible.  Monitor QTc, also keep magnesium/potassium WNL if concurrent therapy can't be avoided. Marland Kitchen Antibiotics: e.g. fluoroquinolones, erythromycin. . Antiarrhythmics: e.g. quinidine, procainamide, disopyramide, sotalol. . Antipsychotics: e.g. phenothiazines, haloperidol.  . Lithium, tricyclic antidepressants, and methadone.  Thank You,  Efraim Kaufmann , PharmD, BCPS 12/30/2020 12:26 PM

## 2020-12-30 NOTE — Progress Notes (Addendum)
NAME:  Casey Buth., MRN:  [id number], DOB:  Apr 13, 1926, LOS: 41 ADMISSION DATE:  12/17/2020, CONSULTATION DATE: 1/24 REFERRING MD:  Darrick Meigs , CHIEF COMPLAINT:  Exudative pleural effusion    Brief History:  87 year old male initially admitted on 1/16 healthcare associated pneumonia and associated right-sided loculated pleural effusion.  Completed broad-spectrum antibiotic course and in spite of appropriate therapy developed worsening loculated right pleural effusion and evolution of air/gas fluid level.  Critical care asked to evaluate  Past Medical History:  Obstructive sleep apnea, atrial fibrillation on chronic anticoagulation, chronic pain syndrome. Significant Hospital Events:  1/16 admitted, started on broad-spectrum antibiotics 1/ 17: Exudative pleural effusion by thoracentesis, 225 cc of hazy amber fluid removed 1/18 cardiology consulted for atrial fibrillation with RVR 1/19 azithromycin completed 1/23: Ceftriaxone completed 1/24: Pulmonary asked to evaluate given worsening loculated right pleural effusion 12/27/2020 right chest tube with Cathflo Activase 10 mg Pulmozyme 5 mg instilled. 12/29/2020: Third dose intrapleural fibrinolytic Consults:    Procedures:  thora 1/17   - LDH 1318, 97% polys  Significant Diagnostic Tests:  CT chest 1/24: 1. Moderate to large loculated right pleural effusion is increased in volume when compared with 12/17/2020. New focus of gas with air-fluid level is noted within the posterior component of the effusion. 2. Progressive atelectasis of the entire right lower lobe with partial atelectasis of the right middle lobe and right upper lobe. 3. Progressive consolidation involving the posteromedial left lower lobe containing air bronchograms. 4. Similar appearance of patchy areas of ground-glass and airspace density within the anterior left upper lobe. Likely post infectious or inflammatory in etiology. 5. Aortic atherosclerosis. Coronary artery  calcifications.  Micro Data:    Antimicrobials:   1/16 azith X 5 1/16 ceftriaxone-> 1/23 1/24 vanc  1/24 cefepime>>> 12/28/2020 12/30/2020: Meropenem >>>     Interim History / Subjective:   12/30/2020: Is not on the ventilator.  Son at the bedside.  Significant concerns of being exposed to the sun.  Yesterday we started low-dose gabapentin because patient's MAR showed he is on gabapentin.  Today the patient and the son stated he hardly able to get he was taking it only once a week.  However they admit that he was taking scheduled Opana on a regular basis.  At this point in time since the hospitalization he has not complained of much pain or has had opioid withdrawal.  He is status post third dose of intrapleural fibrinolytic yesterday -> 600 cc empyema output in the chest tube in the last 24 hours  He remains afebrile and is not confused.  He was able to eat breakfast  On 3 L nasal cannula  However major issue is that he continues to have labile tachycardia and blood pressure alternating between needing Neo-Synephrine and Lopressor as needed.  However procalcitonin, lactic acid and creatinine are all normal.  The blood pressure cuff appears to be small in the right upper extremity -> but even with larger cuff his MAP is 50 with a systolic in the 40J..  Last echocardiogram was in 2019.  Recent troponin normal.  Objective   Blood pressure (!) 89/67, pulse (!) 114, temperature 99.68 F (37.6 C), temperature source Bladder, resp. rate (!) 23, height 5\' 11"  (1.803 m), weight 65.4 kg, SpO2 95 %.        Intake/Output Summary (Last 24 hours) at 12/30/2020 1144 Last data filed at 12/30/2020 8119 Gross per 24 hour  Intake 2499.42 ml  Output 1840 ml  Net 659.42 ml  Filed Weights   12/19/20 0635 12/26/20 1112 12/29/20 0615  Weight: 57.7 kg 62.7 kg 65.4 kg   Exam:  Frail elderly male.  He is alert and oriented x3.  No distress.  He is 3 L oxygen nasal cannula on.  No wheezing.   Irregularly irregular atrial fibrillation with rapid ventricular rate 130s.  Abdomen soft.  His right upper extremity blood pressure cuff is very small.   Resolved Hospital Problem list     Assessment & Plan:  Acute hypoxemic respiratory failure  12/30/2020 requiring 3 L oxygen and mild  Plan -Continue to monitor -Pulse ox goal greater than 88%.   Rt sided loculated effusion empyema s/p chest tube by IR 12/26/20 S/p fibrinolytics 1/26 w/ 720 ml out after.  Status post third dose of fibrinolytics 12/29/2020  12/30/2020: 600 cc chest tube output since the last 24 hours.  Chest x-ray my personal visualization appears better  Plan -Continue chest tube drainage -Intermittent serial chest x-ray with sepsis monitoring -Continue broad-spectrum antibiotics as above -Based on course can consider fourth dose of intrapleural fibrinolytic if needed    Mild wheezing/bronchospasm on 12/27/2020  12/30/2020: No wheezing with bronchodilators  Plan Continue bronchodilator  SIRS/sepsis/septic shock on 12/28/2020 resulting in transfer to the ICU  12/30/2020: Procalcitonin improved white count improving.  No fever.  However dealing with intermittent pressor needs in A. fib RVR.  Appears quite euvolemic.  Overall +8.5 L since admit.  Ongoing circulatory shock needing neo-  Plan Check BP with larger cuff Neo-Synephrine for MAP goal greater than 55 and a systolic blood pressure goal greater than 90 Check random cortisol Empiric hydrocortisone for relative adrenal insufficiency possibility - till cortisol results come back Might need arterial line to sort of true blood pressure  A. fib at home on Cardizem and Eliquis  12/30/2020:A. fib RVR in the last 24-36 hours being very difficult to control because of hypotension  Plan -Get echocardiogram [last 12/21/2017] -Get twelve-lead EKG -Optimize electrolytes -Amiodarone infusion -Might need cardiology consultation based on above  Chronic pain -on  gabapentin as needed at home and scheduled high-dose Opana  12/30/2020: Son does not want schedule gabapentin.  Patient has not found any evidence of opioid withdrawal but is at risk for metabolic encephalopathy  Plan -Stop low-dose scheduled gabapentin that was started 12/29/2020 -To low-dose opioid as needed  Acute metabolic encephalopathy  -suspect in the context of sepsis but also c/b his hearing loss.  Significantly present on 12/28/2020 at the time of transfer to the ICU with sepsis  12/30/2020: Resolved  Plan Supportive care    Best practice (evaluated daily)   Best practice:  Diet: d3 Pain/Anxiety/Delirium protocol (if indicated): see abo v VAP protocol (if indicated): x DVT prophylaxis: scd ->IV heparniog 1/29 GI prophylaxis: na Glucose control: ssi Mobility: bed rest Code Status: DNR but full medical care Family Communication:  Disposition: ICU    Multi-Disciplinary Goals of Care Discussion Date of Discussion 12/30/2020 and is Day 13 since admit  Primary service for patient  CCM  Location of discussion  bedside with the patient  Family and Staff present  son, patient, bedside nurse and Dr. Chase Caller  Summary of discussion  routine updates as of above  Followup goals of care due by  full medical care but no CPR progress  Misc comments if any x         ATTESTATION & SIGNATURE   The patient Casey Gates. is critically ill with multiple organ systems failure and  requires high complexity decision making for assessment and support, frequent evaluation and titration of therapies, application of advanced monitoring technologies and extensive interpretation of multiple databases.   Critical Care Time devoted to patient care services described in this note is  35  Minutes. This time reflects time of care of this signee Dr Brand Males. This critical care time does not reflect procedure time, or teaching time or supervisory time of PA/NP/Med student/Med Resident  etc but could involve care discussion time     Dr. Brand Males, M.D., [facility].C.P Pulmonary and Critical Care Medicine Staff Physician [place] [facility] Pager: [phone], If no answer or between  15:00h - 7:00h: call [phone]  12/30/2020 11:44 AM     LABS    PULMONARY Recent Labs  Lab 12/28/20 1640  PHART 7.350  PCO2ART 48.6*  PO2ART 63.8*  HCO3 26.3  O2SAT 91.0    CBC Recent Labs  Lab 12/28/20 1720 12/29/20 1336 12/30/20 0219  HGB 11.6* 11.2* 10.9*  HCT 38.0* 36.4* 35.2*  WBC 23.1* 16.2* 15.5*  PLT 334 425* 434*    COAGULATION Recent Labs  Lab 12/28/20 1720  INR 1.4*    CARDIAC  No results for input(s): TROPONINI in the last 168 hours. No results for input(s): PROBNP in the last 168 hours.   CHEMISTRY Recent Labs  Lab 12/27/20 0548 12/28/20 0536 12/28/20 0919 12/28/20 1720 12/29/20 0248 12/29/20 1336 12/30/20 0219  NA 140  --  137 140  --  140 139  K 4.2  --  3.7 3.8  --  3.5 3.6  CL 104  --  103 108  --  108 110  CO2 24  --  23 27  --  24 23  GLUCOSE 92  --  122* 116*  --  100* 91  BUN 14  --  17 17  --  16 18  CREATININE 0.69  --  0.88 0.91  --  0.77 0.70  CALCIUM 7.8*  --  7.7* 7.5*  --  7.5* 7.6*  MG 2.0  --   --  2.2  --   --   --   PHOS 3.3 2.4*  --  3.3 2.9  --  2.5   Estimated Creatinine Clearance: 54.5 mL/min (by C-G formula based on SCr of 0.7 mg/dL).   LIVER Recent Labs  Lab 12/28/20 0919 12/28/20 1720 12/30/20 0219  AST 38 35 35  ALT 31 31 27   ALKPHOS 84 81 68  BILITOT 0.4 0.7 0.4  PROT 5.0* 4.9* 4.5*  ALBUMIN 1.8* 1.7* 1.5*  INR  --  1.4*  --      INFECTIOUS Recent Labs  Lab 12/28/20 0919 12/28/20 1140 12/28/20 1720 12/29/20 0248 12/30/20 0219  LATICACIDVEN 2.3* 1.0 1.3  --   --   PROCALCITON  --   --  0.13 0.12 0.10     ENDOCRINE CBG (last 3)  No results for input(s): GLUCAP in the last 72 hours.       IMAGING x48h  - image(s) personally  visualized  -   highlighted in bold DG Chest Port 1 View  Result Date: 12/30/2020 CLINICAL DATA:  Empyema EXAM: PORTABLE CHEST 1 VIEW COMPARISON:  Yesterday FINDINGS: Right chest tube in similar position over the lower chest. Hazy opacity of the right upper of the lower chest from reported empyema. Dense retrocardiac opacity with atelectatic appearance on most recent CT. No visible pneumothorax. No pulmonary edema.  IMPRESSION: Continued right empyema and extensive atelectasis. Electronically Signed   By: Monte Fantasia M.D.   On: 12/30/2020 04:56   DG Chest Port 1 View  Result Date: 12/29/2020 CLINICAL DATA:  Empyema follow-up. EXAM: PORTABLE CHEST 1 VIEW COMPARISON:  Chest x-ray from yesterday at 1634 hours. FINDINGS: Loculated right pleural effusion appears decreased since the prior study with improved aeration of the right lung. Right-sided chest tube remains in place. No pneumothorax. Unchanged retrocardiac left lower lobe opacity. Stable cardiomediastinal silhouette. No acute osseous abnormality. IMPRESSION: 1. Decreased moderate loculated right pleural effusion with improved aeration of the right lung. 2. Unchanged left lower lobe atelectasis versus infiltrate. Electronically Signed   By: Titus Dubin M.D.   On: 12/29/2020 10:44   DG CHEST PORT 1 VIEW  Result Date: 12/28/2020 CLINICAL DATA:  Empyema. EXAM: PORTABLE CHEST 1 VIEW COMPARISON:  Chest x-ray from same day at 0543 hours. FINDINGS: Large loculated right pleural effusion appears increased with progressive atelectasis/collapse of the right lung. Right-sided chest tube remains in place. Unchanged retrocardiac left lower lobe opacity. Stable cardiomediastinal silhouette. No acute osseous abnormality. IMPRESSION: 1. Increased large loculated right pleural effusion with progressive atelectasis/collapse of the right lung. Right-sided chest tube remains in place. 2. Unchanged left lower lobe atelectasis versus infiltrate. Electronically Signed    By: Titus Dubin M.D.   On: 12/28/2020 16:46

## 2020-12-30 NOTE — Progress Notes (Signed)
  Echocardiogram 2D Echocardiogram has been performed.  Davontae Prusinski G Trask Vosler 12/30/2020, 2:04 PM

## 2020-12-30 NOTE — Progress Notes (Signed)
Family at bedside is concerned that the patients blood pressure is dropping because the vasopressor medication is running out and not being administered to patient. RN explained to family member that pump is beeping to alert [place] before it runs out.

## 2020-12-30 NOTE — Progress Notes (Signed)
   He has now received hydrocortisone.  He is on amnioinfusion.  He is also on neo-.  His blood pressure is still low and he is on neo-.  He still has rapid ventricular rate but is looking better.  His evening labs show good perfusion   Echo shows low ejection fraction  Plan  -Discussed with Dr. Loletha Grayer of cardiology will evaluate patient tomorrow -IV heparin to continue -Patient might need arterial line to get accurate blood pressures -We will continue to follow   Son updated     SIGNATURE    Dr. Brand Males, M.D., F.C.C.P,  Pulmonary and Critical Care Medicine Staff Physician, [place] Director - Interstitial Lung Disease  Program  Pulmonary [place] [facility] [place], [place], [address]  Pager: [phone], If no answer  OR between  19:00-7:00h: page [phone]  [phone] Telephone (clinical office): [phone] Telephone (research): [phone]  6:13 PM 12/30/2020     Recent Labs  Lab 12/27/20 0548 12/28/20 0919 12/28/20 1720 12/29/20 1336 12/30/20 0219  CREATININE 0.69 0.88 0.91 0.77 0.70   Recent Labs  Lab 12/28/20 0919 12/28/20 1140 12/28/20 1720 12/29/20 0248 12/30/20 0219  LATICACIDVEN 2.3* 1.0 1.3  --   --   PROCALCITON  --   --  0.13 0.12 0.10

## 2020-12-31 DIAGNOSIS — R579 Shock, unspecified: Secondary | ICD-10-CM

## 2020-12-31 DIAGNOSIS — I4891 Unspecified atrial fibrillation: Secondary | ICD-10-CM | POA: Diagnosis not present

## 2020-12-31 DIAGNOSIS — Z66 Do not resuscitate: Secondary | ICD-10-CM

## 2020-12-31 DIAGNOSIS — I5033 Acute on chronic diastolic (congestive) heart failure: Secondary | ICD-10-CM

## 2020-12-31 DIAGNOSIS — J869 Pyothorax without fistula: Secondary | ICD-10-CM | POA: Diagnosis not present

## 2020-12-31 LAB — CBC
HCT: 39.2 % (ref 39.0–52.0)
Hemoglobin: 12.2 g/dL — ABNORMAL LOW (ref 13.0–17.0)
MCH: 28.2 pg (ref 26.0–34.0)
MCHC: 31.1 g/dL (ref 30.0–36.0)
MCV: 90.7 fL (ref 80.0–100.0)
Platelets: 461 10*3/uL — ABNORMAL HIGH (ref 150–400)
RBC: 4.32 MIL/uL (ref 4.22–5.81)
RDW: 14.1 % (ref 11.5–15.5)
WBC: 18 10*3/uL — ABNORMAL HIGH (ref 4.0–10.5)
nRBC: 0 % (ref 0.0–0.2)

## 2020-12-31 LAB — COMPREHENSIVE METABOLIC PANEL
ALT: 40 U/L (ref 0–44)
AST: 53 U/L — ABNORMAL HIGH (ref 15–41)
Albumin: 1.7 g/dL — ABNORMAL LOW (ref 3.5–5.0)
Alkaline Phosphatase: 72 U/L (ref 38–126)
Anion gap: 9 (ref 5–15)
BUN: 19 mg/dL (ref 8–23)
CO2: 21 mmol/L — ABNORMAL LOW (ref 22–32)
Calcium: 7.7 mg/dL — ABNORMAL LOW (ref 8.9–10.3)
Chloride: 107 mmol/L (ref 98–111)
Creatinine, Ser: 0.84 mg/dL (ref 0.61–1.24)
GFR, Estimated: >60 mL/min (ref >60)
Glucose, Bld: 134 mg/dL — ABNORMAL HIGH (ref 70–99)
Potassium: 4.1 mmol/L (ref 3.5–5.1)
Sodium: 137 mmol/L (ref 135–145)
Total Bilirubin: 0.2 mg/dL — ABNORMAL LOW (ref 0.3–1.2)
Total Protein: 4.8 g/dL — ABNORMAL LOW (ref 6.5–8.1)

## 2020-12-31 LAB — GLUCOSE, CAPILLARY
Glucose-Capillary: 135 mg/dL — ABNORMAL HIGH (ref 70–99)
Glucose-Capillary: 140 mg/dL — ABNORMAL HIGH (ref 70–99)
Glucose-Capillary: 149 mg/dL — ABNORMAL HIGH (ref 70–99)
Glucose-Capillary: 127 mg/dL — ABNORMAL HIGH (ref 70–99)
Glucose-Capillary: 141 mg/dL — ABNORMAL HIGH (ref 70–99)

## 2020-12-31 LAB — AEROBIC/ANAEROBIC CULTURE W GRAM STAIN (SURGICAL/DEEP WOUND)
Culture: NO GROWTH
Gram Stain: NONE SEEN

## 2020-12-31 LAB — LACTIC ACID, PLASMA: Lactic Acid, Venous: 1.3 mmol/L (ref 0.5–1.9)

## 2020-12-31 LAB — TROPONIN I (HIGH SENSITIVITY): Troponin I (High Sensitivity): 9 ng/L (ref <18)

## 2020-12-31 LAB — HEPARIN LEVEL (UNFRACTIONATED)
Heparin Unfractionated: 0.43 IU/mL (ref 0.30–0.70)
Heparin Unfractionated: 0.33 IU/mL (ref 0.30–0.70)

## 2020-12-31 LAB — VANCOMYCIN, TROUGH: Vancomycin Tr: 13 ug/mL — ABNORMAL LOW (ref 15–20)

## 2020-12-31 LAB — PHOSPHORUS: Phosphorus: 2.4 mg/dL — ABNORMAL LOW (ref 2.5–4.6)

## 2020-12-31 LAB — MAGNESIUM: Magnesium: 2.2 mg/dL (ref 1.7–2.4)

## 2020-12-31 MED ORDER — DIGOXIN 0.0625 MG HALF TABLET
0.0625 mg | ORAL_TABLET | Freq: Every day | ORAL | Status: DC
  Administered 2020-12-31 – 2021-01-10 (×11): 0.0625 mg via ORAL
  Filled 2020-12-31 (×8): qty 1
  Filled 2020-12-31: qty 0.5
  Filled 2020-12-31: qty 1
  Filled 2020-12-31: qty 0.5

## 2020-12-31 MED ORDER — POTASSIUM PHOSPHATES 15 MMOLE/5ML IV SOLN
10.0000 mmol | Freq: Once | INTRAVENOUS | Status: AC
  Administered 2020-12-31: 10 mmol via INTRAVENOUS
  Filled 2020-12-31: qty 3.33

## 2020-12-31 NOTE — Progress Notes (Signed)
Pharmacy: Re-heparin and vancomycin  Patient's 87 y.o M currently on heparin drip for afib.  Confirmatory heparin level now back therapeutic at 0.33  (goal 0.3-0.7) with rate running at 1200 units/hr.  He's also on vancomycin for empyema.  Cefepime changed to meropenem today - vancomycin peak came back as 22 and trough as 13 with AUC therapeutic at 424.  Plan: - continue heparin drip at 1200 units/hr - daily heparin level - monitor for s/sx bleeding - continue vancomycin 1000 mg IV q24h  Dia Sitter, PharmD, BCPS 12/31/2020 5:41 PM

## 2020-12-31 NOTE — Progress Notes (Signed)
Progress Note  Patient Name: Casey Gates. Date of Encounter: 12/31/2020  [facility] Cardiologist: Larae Grooms, MD   Subjective   Last seen by Cardiology 12/19/2020. Asked to reevaluate for decrease in LVEF and AFib w RVR.   Brief summary of history: Elderly gentleman w longstanding persistent AFib, remote GI bleed due to GIST (resected, no recurrent bleeding), untreated OSA, prostate Ca, chronic pain on opiates, history of falls and anemia.  Atrial fibrillation first diagnosed July 2021 (asymptomatic, treated as a permanent arrhythmia w rate control and anticoagulation). CHADSVasc 2 (age), now 3 w low LVEF. Has chronic RBBB+LAFB.  Admitted 2 weeks ago w altered mental status, right pneumonia and empyema, mild RVR, hypernatremia/hypokalemia.  Echo Jan 2019 EF 65-70%, was not reassessed until yesterday's echo, which shows EF 40-45% (unable to assess regional wall motion, difficult study). Dilated left atrium (severely so in my opinion). Unchanged mildly dilated ascending aorta 41 mm.   Despite antibiotics, placement of chest tube and intrapleural fibrinolytics, pleural collection has worsened. Transferred to ICU for septic shock on 01/27. Amiodarone IV and pressors started 01/29 for RVR and hypotension. Also started empirical hydrocortisone for possible adrenal insufficiency.  Incidental note of aortic and "dense" coronary artery atherosclerotic calcifications.  Inpatient Medications    Scheduled Meds: . artificial tears  1 application Left Eye TID  . budesonide (PULMICORT) nebulizer solution  0.25 mg Nebulization BID  . chlorhexidine  15 mL Mouth Rinse BID  . Chlorhexidine Gluconate Cloth  6 each Topical Daily  . feeding supplement  237 mL Oral BID BM  . hydrocortisone sod succinate (SOLU-CORTEF) inj  50 mg Intravenous Q6H  . insulin aspart  0-15 Units Subcutaneous TID WC  . mouth rinse  15 mL Mouth Rinse q12n4p   Continuous Infusions: . sodium chloride 10 mL/hr  at 12/31/20 0402  . sodium chloride 10 mL/hr at 12/31/20 0700  . amiodarone 30 mg/hr (12/31/20 0700)  . heparin 1,200 Units/hr (12/31/20 0056)  . meropenem (MERREM) IV Stopped (12/31/20 1829)  . phenylephrine (NEO-SYNEPHRINE) Adult infusion 40 mcg/min (12/31/20 0700)  . vancomycin Stopped (12/30/20 1917)   PRN Meds: fentaNYL (SUBLIMAZE) injection, ipratropium-albuterol, lip balm, metoprolol tartrate, ondansetron **OR** ondansetron (ZOFRAN) IV, oxyCODONE, Resource ThickenUp Clear   Vital Signs    Vitals:   12/31/20 0630 12/31/20 0645 12/31/20 0700 12/31/20 0800  BP: 99/65 (!) 91/57 97/60 (!) 108/48  Pulse: (!) 105 96 89 (!) 108  Resp: (!) 22 (!) 22 19 (!) 9  Temp: (!) 96.98 F (36.1 C) (!) 97.52 F (36.4 C) 97.7 F (36.5 C) (!) 97.52 F (36.4 C)  TempSrc:      SpO2: 98% 97% 97% 98%  Weight: 65.1 kg     Height:        Intake/Output Summary (Last 24 hours) at 12/31/2020 0857 Last data filed at 12/31/2020 0700 Gross per 24 hour  Intake 3279.75 ml  Output 1275 ml  Net 2004.75 ml   Last 3 Weights 12/31/2020 12/29/2020 12/26/2020  Weight (lbs) 143 lb 8.3 oz 144 lb 2.9 oz 138 lb 3.7 oz  Weight (kg) 65.1 kg 65.4 kg 62.7 kg      Telemetry    AFib w RVR, now rates 100-110 - Personally Reviewed  ECG    AFib w RVR,  98 on 08/24/2020 97 on 09/01/2020 131 on 12/17/2020 160 on 12/27/2020 125 on 12/30/2020 All show RBBB+LAFB (old) - Personally Reviewed  Physical Exam  Elderly, frail GEN: No acute distress.   Neck: No  JVD Cardiac: irregular, no murmurs, rubs, or gallops.  Respiratory: absent breath sounds right base GI: Soft, nontender, non-distended  MS: No edema; No deformity. Neuro:  Nonfocal x HOH Psych: Normal affect   Labs    High Sensitivity Troponin:   Recent Labs  Lab 12/28/20 1720  TROPONINIHS 14      Chemistry Recent Labs  Lab 12/28/20 1720 12/29/20 1336 12/30/20 0219 12/31/20 0229  NA 140 140 139 137  K 3.8 3.5 3.6 4.1  CL 108 108 110 107  CO2  27 24 23  21*  GLUCOSE 116* 100* 91 134*  BUN 17 16 18 19   CREATININE 0.91 0.77 0.70 0.84  CALCIUM 7.5* 7.5* 7.6* 7.7*  PROT 4.9*  --  4.5* 4.8*  ALBUMIN 1.7*  --  1.5* 1.7*  AST 35  --  35 53*  ALT 31  --  27 40  ALKPHOS 81  --  68 72  BILITOT 0.7  --  0.4 0.2*  GFRNONAA >60 >60 >60 >60  ANIONGAP 5 8 6 9      Hematology Recent Labs  Lab 12/29/20 1336 12/30/20 0219 12/31/20 0229  WBC 16.2* 15.5* 18.0*  RBC 4.00* 3.92* 4.32  HGB 11.2* 10.9* 12.2*  HCT 36.4* 35.2* 39.2  MCV 91.0 89.8 90.7  MCH 28.0 27.8 28.2  MCHC 30.8 31.0 31.1  RDW 14.5 14.5 14.1  PLT 425* 434* 461*    BNPNo results for input(s): BNP, PROBNP in the last 168 hours.   DDimer No results for input(s): DDIMER in the last 168 hours.   Radiology    DG Chest Port 1 View  Result Date: 12/30/2020 CLINICAL DATA:  Empyema EXAM: PORTABLE CHEST 1 VIEW COMPARISON:  Yesterday FINDINGS: Right chest tube in similar position over the lower chest. Hazy opacity of the right upper of the lower chest from reported empyema. Dense retrocardiac opacity with atelectatic appearance on most recent CT. No visible pneumothorax. No pulmonary edema. IMPRESSION: Continued right empyema and extensive atelectasis. Electronically Signed   By: Monte Fantasia M.D.   On: 12/30/2020 04:56   DG Chest Port 1 View  Result Date: 12/29/2020 CLINICAL DATA:  Empyema follow-up. EXAM: PORTABLE CHEST 1 VIEW COMPARISON:  Chest x-ray from yesterday at 1634 hours. FINDINGS: Loculated right pleural effusion appears decreased since the prior study with improved aeration of the right lung. Right-sided chest tube remains in place. No pneumothorax. Unchanged retrocardiac left lower lobe opacity. Stable cardiomediastinal silhouette. No acute osseous abnormality. IMPRESSION: 1. Decreased moderate loculated right pleural effusion with improved aeration of the right lung. 2. Unchanged left lower lobe atelectasis versus infiltrate. Electronically Signed   By: Titus Dubin M.D.   On: 12/29/2020 10:44   ECHOCARDIOGRAM COMPLETE  Result Date: 12/30/2020    ECHOCARDIOGRAM REPORT   Patient Name:   Casey Gates. Date of Exam: 12/30/2020 Medical Rec #:  [id number]          Height:       71.0 in Accession #:    [id number]         Weight:       144.2 lb Date of Birth:  May 12, 1926         BSA:          1.835 m Patient Age:    59 years           BP:           81/59 mmHg Patient Gender: M  HR:           120 bpm. Exam Location:  Inpatient Procedure: 2D Echo, Cardiac Doppler and Color Doppler Indications:    I48.1 Persistent atrial fibrillation  History:        Patient has prior history of Echocardiogram examinations, most                 recent 12/31/2017. Risk Factors:Sleep Apnea. Cancer.  Sonographer:    Jonelle Sidle Dance Referring Phys: [place]  1. Left ventricular ejection fraction, by estimation, is 40 to 45%. Difficult to determine systolic function as poor image quality and patient is in Afib with RVR, but appears left ventricle has mildly decreased function. Would consider repeat limited echo with contrast once heart rate better controlled. Left ventricular endocardial border not optimally defined to evaluate regional wall motion. There is mild left ventricular hypertrophy. Left ventricular diastolic parameters are indeterminate.  2. Right ventricular systolic function is mildly reduced. The right ventricular size is mildly enlarged. There is normal pulmonary artery systolic pressure. The estimated right ventricular systolic pressure is 28.4 mmHg.  3. Left atrial size was moderately dilated.  4. The mitral valve is degenerative. No evidence of mitral valve regurgitation. Moderate mitral annular calcification.  5. The aortic valve was not well visualized. Aortic valve regurgitation is trivial. No aortic stenosis is present.  6. Aortic dilatation noted. There is mild dilatation of the aortic root, measuring 42 mm. There is mild dilatation of  the ascending aorta, measuring 41 mm.  7. The inferior vena cava is dilated in size with <50% respiratory variability, suggesting right atrial pressure of 15 mmHg. FINDINGS  Left Ventricle: Left ventricular ejection fraction, by estimation, is 40 to 45%. The left ventricle has mildly decreased function. Left ventricular endocardial border not optimally defined to evaluate regional wall motion. The left ventricular internal cavity size was small. There is mild left ventricular hypertrophy. Left ventricular diastolic parameters are indeterminate. Right Ventricle: The right ventricular size is mildly enlarged. Right vetricular wall thickness was not well visualized. Right ventricular systolic function is mildly reduced. There is normal pulmonary artery systolic pressure. The tricuspid regurgitant velocity is 2.08 m/s, and with an assumed right atrial pressure of 15 mmHg, the estimated right ventricular systolic pressure is 13.2 mmHg. Left Atrium: Left atrial size was moderately dilated. Right Atrium: Right atrial size was normal in size. Pericardium: There is no evidence of pericardial effusion. Mitral Valve: The mitral valve is degenerative in appearance. Moderate mitral annular calcification. No evidence of mitral valve regurgitation. Tricuspid Valve: The tricuspid valve is normal in structure. Tricuspid valve regurgitation is trivial. Aortic Valve: The aortic valve was not well visualized. Aortic valve regurgitation is trivial. Aortic regurgitation PHT measures 352 msec. No aortic stenosis is present. Pulmonic Valve: The pulmonic valve was not well visualized. Pulmonic valve regurgitation is not visualized. Aorta: Aortic dilatation noted. There is mild dilatation of the aortic root, measuring 42 mm. There is mild dilatation of the ascending aorta, measuring 41 mm. Venous: The inferior vena cava is dilated in size with less than 50% respiratory variability, suggesting right atrial pressure of 15 mmHg. IAS/Shunts: The  interatrial septum was not well visualized.  LEFT VENTRICLE PLAX 2D LVIDd:         3.70 cm LVIDs:         2.70 cm LV PW:         1.30 cm LV IVS:        1.10 cm LVOT diam:  2.20 cm LV SV:         40 LV SV Index:   22 LVOT Area:     3.80 cm  RIGHT VENTRICLE          IVC RV Basal diam:  3.70 cm  IVC diam: 2.20 cm RV Mid diam:    2.90 cm TAPSE (M-mode): 1.8 cm LEFT ATRIUM           Index       RIGHT ATRIUM           Index LA diam:      4.90 cm 2.67 cm/m  RA Area:     17.80 cm LA Vol (A2C): 81.3 ml 44.31 ml/m RA Volume:   45.60 ml  24.85 ml/m LA Vol (A4C): 79.7 ml 43.44 ml/m  AORTIC VALVE LVOT Vmax:   67.30 cm/s LVOT Vmean:  49.000 cm/s LVOT VTI:    0.105 m AI PHT:      352 msec  AORTA Ao Root diam: 4.20 cm Ao Asc diam:  4.50 cm MITRAL VALVE               TRICUSPID VALVE MV Area (PHT): 3.08 cm    TR Peak grad:   17.3 mmHg MV Decel Time: 246 msec    TR Vmax:        208.00 cm/s MV E velocity: 59.50 cm/s MV A velocity: 52.80 cm/s  SHUNTS MV E/A ratio:  1.13        Systemic VTI:  0.10 m                            Systemic Diam: 2.20 cm Oswaldo Milian MD Electronically signed by Oswaldo Milian MD Signature Date/Time: 12/30/2020/2:26:21 PM    Final     Cardiac Studies   2D echo Jan 2019 - Left ventricle: The cavity size was normal. Systolic function was  vigorous. The estimated ejection fraction was in the range of 65%  to 70%. Images were inadequate for LV wall motion assessment.  Left ventricular diastolic function parameters were normal.  - Aortic valve: Poorly visualized. Trileaflet; normal thickness,  mildly calcified leaflets. There was trivial regurgitation.  - Mitral valve: Calcified annulus.  - Left atrium: The atrium was mildly dilated.  - Pulmonary arteries: Systolic pressure could not be accurately  estimated.   2D echo Dec 30 2020 1. Left ventricular ejection fraction, by estimation, is 40 to 45%.  Difficult to determine systolic function as poor image quality  and patient  is in Afib with RVR, but appears left ventricle has mildly decreased  function. Would consider repeat limited  echo with contrast once heart rate better controlled. Left ventricular  endocardial border not optimally defined to evaluate regional wall motion.  There is mild left ventricular hypertrophy. Left ventricular diastolic  parameters are indeterminate.  2. Right ventricular systolic function is mildly reduced. The right  ventricular size is mildly enlarged. There is normal pulmonary artery  systolic pressure. The estimated right ventricular systolic pressure is  [id number] mmHg.  3. Left atrial size was moderately dilated.  4. The mitral valve is degenerative. No evidence of mitral valve  regurgitation. Moderate mitral annular calcification.  5. The aortic valve was not well visualized. Aortic valve regurgitation  is trivial. No aortic stenosis is present.  6. Aortic dilatation noted. There is mild dilatation of the aortic root,  measuring 42 mm. There is mild dilatation of the ascending aorta,  measuring 41 mm.  7. The inferior vena cava is dilated in size with <50% respiratory  variability, suggesting right atrial pressure of 15 mmHg.   Patient Profile     87 y.o. male resident of [facility] with permanent AFib, coronary calcifications without clinical CAD, untreated OSA, anemia, prostate Ca admitted with RLL pneumonia/empyema, complicated by septic shock and Afib w RVR, foiund to have newly decreased LVEF when compared to echo from 2019 (comparison echo predates onset of AFib).  Assessment & Plan    1.  AFib w RVR: not surprising that he has RVR w sepsis. Adding amiodarone has done a good job w rate control. Will also add a small amount of digoxin, since any other agents would worsen hypotension. Ideally, once current acute illness resolves he will be rate controlled with oral beta blockers only. Target heart rate (as outpt) 70-90 bpm, due to decreased  EF. CHADSVasc 4 (age 64, low LVEF, coronary calcifications on CT) 2. LV systolic dysfunction: hypotensive, but no overt hypervolemia. Cannot be sure when the decline in EF occurred (was not rechecked since 2019). It is possible that he has tachycardia cardiomyopathy (heart rate was in the 100 range since AF onset). Also very possible that he has CAD, but he does not have angina, evidence of prior MI, troponin remains normal despite tachycardia and hypotension over last 24 hours.  Recheck echo after 2-3 months of improved rate control. No RAAS inh due to hypotension. Will not restart diltiazem due to low EF. 3. Coronary calcifications on CT: Not particularly surprising at his age. Asymptomatic. No plan for CAD evaluation in this nonagenarian with very limited functional status. No ASA due to anticoagulation and increased bleeding risk. Check lipid profile and plan statin therapy to target LDL<70 if necessary. 4. RBBB+LAFB: at risk for advanced AV block w rate control/antiarrhythmic meds. Monitor.     For questions or updates, please contact [facility] Please consult [email or web address] for contact info under        Signed, Sanda Klein, MD  12/31/2020, 8:57 AM

## 2020-12-31 NOTE — Progress Notes (Signed)
NAME:  Casey Barquin., MRN:  [id number], DOB:  05-16-1926, LOS: 37 ADMISSION DATE:  12/17/2020, CONSULTATION DATE: 1/24 REFERRING MD:  Darrick Meigs , CHIEF COMPLAINT:  Exudative pleural effusion    Brief History:  87 year old male initially admitted on 1/16 healthcare associated pneumonia and associated right-sided loculated pleural effusion.  Completed broad-spectrum antibiotic course and in spite of appropriate therapy developed worsening loculated right pleural effusion and evolution of air/gas fluid level.  Critical care asked to evaluate  Past Medical History:  Obstructive sleep apnea, atrial fibrillation on chronic anticoagulation, chronic pain syndrome. Significant Hospital Events:  1/16 admitted, started on broad-spectrum antibiotics 1/ 17: Exudative pleural effusion by thoracentesis, 225 cc of hazy amber fluid removed 1/18 cardiology consulted for atrial fibrillation with RVR 1/19 azithromycin completed 1/23: Ceftriaxone completed 1/24: Pulmonary asked to evaluate given worsening loculated right pleural effusion 12/27/2020 right chest tube with Cathflo Activase 10 mg Pulmozyme 5 mg instilled. 12/29/2020: Third dose intrapleural fibrinolytic  129 -  12/30/2020: Is not on the ventilator.  Son at the bedside.  Significant concerns of being exposed to the sun.  Yesterday we started low-dose gabapentin because patient's MAR showed he is on gabapentin.  Today the patient and the son stated he hardly able to get he was taking it only once a week.  However they admit that he was taking scheduled Opana on a regular basis.  At this point in time since the hospitalization he has not complained of much pain or has had opioid withdrawal. He is status post third dose of intrapleural fibrinolytic yesterday -> 600 cc empyema output in the chest tube in the last 24 hours . He remains afebrile and is not confused.  He was able to eat breakfast.  On 3 L nasal cannula. However major issue is that he continues to  have labile tachycardia and blood pressure alternating between needing Neo-Synephrine and Lopressor as needed.  However procalcitonin, lactic acid and creatinine are all normal.  The blood pressure cuff appears to be small in the right upper extremity -> but even with larger cuff his MAP is 50 with a systolic in the [id number]..  Last echocardiogram was in 2019.  Recent troponin normal.  Consults:    Procedures:  thora 1/17   - LDH 1318, 97% polys  Significant Diagnostic Tests:  CT chest 1/24: 1. Moderate to large loculated right pleural effusion is increased in volume when compared with 12/17/2020. New focus of gas with air-fluid level is noted within the posterior component of the effusion. 2. Progressive atelectasis of the entire right lower lobe with partial atelectasis of the right middle lobe and right upper lobe. 3. Progressive consolidation involving the posteromedial left lower lobe containing air bronchograms. 4. Similar appearance of patchy areas of ground-glass and airspace density within the anterior left upper lobe. Likely post infectious or inflammatory in etiology. 5. Aortic atherosclerosis. Coronary artery calcifications.  Micro Data:  12/17/2020 Covid PCR-negative 12/17/2020: Blood culture negative 12/18/2020: Pleural fluid culture negative 12/19/2020: MRSA PCR positive xx 12/26/2020: Chest tube aspirate no growth 12/28/2020: Blood culture -negative so far 12/28/2020: Urine culture no growth  Antimicrobials:   1/16 azith X 5 1/16 ceftriaxone-> 1/23 1/24 vanc  >> 1/24 cefepime>>> 12/28/2020 12/30/2020: Meropenem >>>     Interim History / Subjective:   12/31/2020 = low-dose nasal cannula oxygen continuous.  Cortisol was 16.1.  Hydrocortisone continues.  Still on Neo-Synephrine 40 mcg/min.  Systolic of 92 with a MAP greater than 70.  On digoxin and  amnioinfusion after being seen by cardiology.   Chest tube at 700 cc overnight and 200 cc since this morning.  Afebrile.   Eating well but not wanting to get out of bed.  Seen by cardiology   Objective   Blood pressure 102/66, pulse 97, temperature 98.6 F (37 C), temperature source Bladder, resp. rate (!) 25, height 5\' 11"  (1.803 m), weight 65.1 kg, SpO2 97 %.        Intake/Output Summary (Last 24 hours) at 12/31/2020 1533 Last data filed at 12/31/2020 0700 Gross per 24 hour  Intake 3279.75 ml  Output 1025 ml  Net 2254.75 ml   Filed Weights   12/26/20 1112 12/29/20 0615 12/31/20 0630  Weight: 62.7 kg 65.4 kg 65.1 kg   Exam:  Frail elderly male.  Looks stable.  Alert and oriented x3.  Able to have a good conversation.  Nasal cannula oxygen on clear to auscultation bilaterally normal heart sounds but tachycardic irregularly irregular.  Improved tachycardia compared to yesterday.  Abdomen soft    Resolved Hospital Problem list      [id number] list    Patient Active Problem List   Diagnosis Date Noted  . DNR (do not resuscitate) 12/31/2020  . Goals of care, counseling/discussion 12/29/2020  . Empyema ([place])   . Atrial fibrillation with RVR ([place]) 12/17/2020  . Acute metabolic encephalopathy [id number]  . Hypernatremia 12/17/2020  . Coagulation defect ([place]) 09/27/2020  . Back pain, chronic 12/14/2018  . Edema 12/14/2018  . Prostate cancer ([place]) 12/14/2018  . History of amputation of lesser toe ([place]) 11/16/2018  . Chronic ulcer of right foot with fat layer exposed ([place]) 11/05/2018  . Contusion of right hip region 07/24/2018  . Multifocal pneumonia 03/18/2018  . Septic shock ([place]) 03/18/2018  . Malignant gastrointestinal stromal tumor (GIST) of stomach ([place]) 03/12/2018  . Urinary retention 02/24/2018  . Gastrointestinal stromal tumor (GIST) of stomach ([place])   . Restless leg syndrome   . Acute GI bleeding 02/16/2018  . Acute blood loss anemia 02/16/2018  . Chronic low back pain 02/05/2018  . Degeneration of lumbar intervertebral disc 02/05/2018  . Constipation 01/20/2018  . Exertional shortness  of breath 12/30/2017  . Rupture of left quadriceps muscle 07/07/2017  . Rupture of left quadriceps tendon 06/26/2017  . Eczema 06/26/2017  . Chest pain 07/29/2015  . OSA on CPAP 07/10/2015  . CPAP use counseling 07/10/2015  . Excessive daytime sleepiness 02/22/2015  . Sleep apnea 02/22/2015  . Snoring 02/22/2015  . Nocturia more than twice per night 02/22/2015  . Syncope 12/31/2012  . Bifascicular bundle branch block 12/31/2012    Class: Acute  . Cellulitis and abscess of leg, except foot 12/04/2012  . Chronic pain 12/04/2012  . Kyphosis 12/04/2012  . H/O unilateral nephrectomy 12/04/2012  . H/O prostate cancer 12/04/2012  . S/P TURP 12/04/2012      Assessment & Plan:  Acute hypoxemic respiratory failure  12/31/2020: Requiring 3 L oxygen and mild hypoxemic respiratory failure without change  Plan -Continue to monitor -Pulse ox goal greater than 88%.   Rt sided loculated effusion empyema s/p chest tube by IR 12/26/20 S/p fibrinolytics 1/26 w/ 720 ml out after.  Status post third dose of fibrinolytics 12/29/2020  12/31/2020: 700 cc drainage overnight and 200 cc since 7 AM  Plan -Continue chest tube drainage -Intermittent serial chest x-ray with sepsis monitoring -Continue broad-spectrum antibiotics as above -Based on course can consider fourth dose of intrapleural fibrinolytic if needed    Mild wheezing/bronchospasm  on 12/27/2020 -resolved  12/31/2020: No wheezing with bronchodilators  Plan Continue bronchodilator  SIRS/sepsis/septic shock on 12/28/2020 resulting in transfer to the ICU   12/31/2020: Still needing Neo-Synephrine despite hydrocortisone since 12/30/2020.Marland Kitchen  Systolic appears low and diastolic appears to be high.  Also on digoxin and amiodarone.  Appears quite well volume resuscitated.  As appropriate blood pressure cuff  Plan  -Change systolic blood pressure goal to > 90 with MAP > 55 (OR) MAP goal > 65 -Might need arterial line  - neo for above map  goal  A. fib at home on Cardizem and Eliquis -very difficult to control between 12/28/2020 and 02/06/6577 New onset systolic heart failure on echocardiogram 12/30/2020 [new since 2019]  12/31/2020: Improved ventricular rate after digoxin and amnio infusion and cardiology consult  Plan -Cardiology consultation to support - amio gtt  - digoxin  - heparin IV gtt  Chronic pain -on gabapentin as needed at home and scheduled high-dose Opana (12/30/2020: Son does not want schedule gabapentin.  Patient has not found any evidence of opioid withdrawal but is at risk for metabolic encephalopathy)  12/31/2020 -no reports of chronic pain on opioid withdrawal  Plan - low-dose opioid as needed -Hold off on a gabapentin  Acute metabolic encephalopathy  -suspect in the context of sepsis but also c/b his hearing loss.  Significantly present on 12/28/2020 at the time of transfer to the ICU with sepsis.  Resolved 12/29/2020-12/30/2020  12/31/2020: Normal  Plan Supportive care Monitor  Anemia of critical illness  12/31/2020 -no active bleeding  Plan - PRBC for hgb </= 6.9gm%    - exceptions are   -  if ACS susepcted/confirmed then transfuse for hgb </= 8.0gm%,  or    -  active bleeding with hemodynamic instability, then transfuse regardless of hemoglobin value   At at all times try to transfuse 1 unit prbc as possible with exception of active hemorrhage  Electrolyte imbalance  12/31/2020 - mild low phos  Plan  - replete    Best practice (evaluated daily)   Best practice:  Diet: d3 Pain/Anxiety/Delirium protocol (if indicated): see abo v VAP protocol (if indicated): x DVT prophylaxis: scd ->IV heparniog 1/29 GI prophylaxis: na Glucose control: ssi Mobility: bed rest Code Status: DNR but full medical care Disposition: ICU Family Communication:   1/29 - with son see below  1/30 - called son Lavell Supple over phone LMTCB   Multi-Disciplinary Goals of Care Discussion Date of Discussion  12/30/2020 and is Day 13 since admit  Primary service for patient  CCM  Location of discussion  bedside with the patient  Family and Staff present  son, patient, bedside nurse and Dr. Chase Caller  Summary of discussion  routine updates as of above  Followup goals of care due by  full medical care but no CPR progress  Misc comments if any x         ATTESTATION & SIGNATURE   The patient Aston Lieske. is critically ill with multiple organ systems failure and requires high complexity decision making for assessment and support, frequent evaluation and titration of therapies, application of advanced monitoring technologies and extensive interpretation of multiple databases.   Critical Care Time devoted to patient care services described in this note is  35  Minutes. This time reflects time of care of this signee Dr Brand Males. This critical care time does not reflect procedure time, or teaching time or supervisory time of PA/NP/Med student/Med Resident etc but could involve care discussion  time     Dr. Brand Males, M.D., [facility].C.P Pulmonary and Critical Care Medicine Staff Physician [place] [facility] Pager: [phone], If no answer or between  15:00h - 7:00h: call [phone]  12/31/2020 3:50 PM     LABS    PULMONARY Recent Labs  Lab 12/28/20 1640  PHART 7.350  PCO2ART 48.6*  PO2ART 63.8*  HCO3 26.3  O2SAT 91.0    CBC Recent Labs  Lab 12/29/20 1336 12/30/20 0219 12/31/20 0229  HGB 11.2* 10.9* 12.2*  HCT 36.4* 35.2* 39.2  WBC 16.2* 15.5* 18.0*  PLT 425* 434* 461*    COAGULATION Recent Labs  Lab 12/28/20 1720  INR 1.4*    CARDIAC  No results for input(s): TROPONINI in the last 168 hours. No results for input(s): PROBNP in the last 168 hours.   CHEMISTRY Recent Labs  Lab 12/27/20 0548 12/28/20 0536 12/28/20 0919 12/28/20 1720 12/29/20 0248 12/29/20 1336 12/30/20 0219 12/31/20 0229  NA 140   --  137 140  --  140 139 137  K 4.2  --  3.7 3.8  --  3.5 3.6 4.1  CL 104  --  103 108  --  108 110 107  CO2 24  --  23 27  --  24 23 21*  GLUCOSE 92  --  122* 116*  --  100* 91 134*  BUN 14  --  17 17  --  16 18 19   CREATININE 0.69  --  0.88 0.91  --  0.77 0.70 0.84  CALCIUM 7.8*  --  7.7* 7.5*  --  7.5* 7.6* 7.7*  MG 2.0  --   --  2.2  --   --  2.0 2.2  PHOS 3.3 2.4*  --  3.3 2.9  --  2.5 2.4*   Estimated Creatinine Clearance: 51.7 mL/min (by C-G formula based on SCr of 0.84 mg/dL).   LIVER Recent Labs  Lab 12/28/20 0919 12/28/20 1720 12/30/20 0219 12/31/20 0229  AST 38 35 35 53*  ALT 31 31 27  40  ALKPHOS 84 81 68 72  BILITOT 0.4 0.7 0.4 0.2*  PROT 5.0* 4.9* 4.5* 4.8*  ALBUMIN 1.8* 1.7* 1.5* 1.7*  INR  --  1.4*  --   --      INFECTIOUS Recent Labs  Lab 12/28/20 1140 12/28/20 1720 12/29/20 0248 12/30/20 0219 12/31/20 0229  LATICACIDVEN 1.0 1.3  --   --  1.3  PROCALCITON  --  0.13 0.12 0.10  --      ENDOCRINE CBG (last 3)  Recent Labs    12/31/20 0328 12/31/20 0901 12/31/20 1125  GLUCAP 135* 140* 149*         IMAGING x48h  - image(s) personally visualized  -   highlighted in bold DG Chest Port 1 View  Result Date: 12/30/2020 CLINICAL DATA:  Empyema EXAM: PORTABLE CHEST 1 VIEW COMPARISON:  Yesterday FINDINGS: Right chest tube in similar position over the lower chest. Hazy opacity of the right upper of the lower chest from reported empyema. Dense retrocardiac opacity with atelectatic appearance on most recent CT. No visible pneumothorax. No pulmonary edema. IMPRESSION: Continued right empyema and extensive atelectasis. Electronically Signed   By: Monte Fantasia M.D.   On: 12/30/2020 04:56   ECHOCARDIOGRAM COMPLETE  Result Date: 12/30/2020    ECHOCARDIOGRAM REPORT   Patient Name:   Louise Victory. Date of Exam: 12/30/2020 Medical Rec #:  [id number]  Height:       71.0 in Accession #:    [id number]         Weight:       144.2 lb Date of Birth:   1926/06/20         BSA:          1.835 m Patient Age:    22 years           BP:           81/59 mmHg Patient Gender: M                  HR:           120 bpm. Exam Location:  Inpatient Procedure: 2D Echo, Cardiac Doppler and Color Doppler Indications:    I48.1 Persistent atrial fibrillation  History:        Patient has prior history of Echocardiogram examinations, most                 recent 12/31/2017. Risk Factors:Sleep Apnea. Cancer.  Sonographer:    Jonelle Sidle Dance Referring Phys: [name]  1. Left ventricular ejection fraction, by estimation, is 40 to 45%. Difficult to determine systolic function as poor image quality and patient is in Afib with RVR, but appears left ventricle has mildly decreased function. Would consider repeat limited echo with contrast once heart rate better controlled. Left ventricular endocardial border not optimally defined to evaluate regional wall motion. There is mild left ventricular hypertrophy. Left ventricular diastolic parameters are indeterminate.  2. Right ventricular systolic function is mildly reduced. The right ventricular size is mildly enlarged. There is normal pulmonary artery systolic pressure. The estimated right ventricular systolic pressure is [id number] mmHg.  3. Left atrial size was moderately dilated.  4. The mitral valve is degenerative. No evidence of mitral valve regurgitation. Moderate mitral annular calcification.  5. The aortic valve was not well visualized. Aortic valve regurgitation is trivial. No aortic stenosis is present.  6. Aortic dilatation noted. There is mild dilatation of the aortic root, measuring 42 mm. There is mild dilatation of the ascending aorta, measuring 41 mm.  7. The inferior vena cava is dilated in size with <50% respiratory variability, suggesting right atrial pressure of 15 mmHg. FINDINGS  Left Ventricle: Left ventricular ejection fraction, by estimation, is 40 to 45%. The left ventricle has mildly decreased function.  Left ventricular endocardial border not optimally defined to evaluate regional wall motion. The left ventricular internal cavity size was small. There is mild left ventricular hypertrophy. Left ventricular diastolic parameters are indeterminate. Right Ventricle: The right ventricular size is mildly enlarged. Right vetricular wall thickness was not well visualized. Right ventricular systolic function is mildly reduced. There is normal pulmonary artery systolic pressure. The tricuspid regurgitant velocity is 2.08 m/s, and with an assumed right atrial pressure of 15 mmHg, the estimated right ventricular systolic pressure is [id number] mmHg. Left Atrium: Left atrial size was moderately dilated. Right Atrium: Right atrial size was normal in size. Pericardium: There is no evidence of pericardial effusion. Mitral Valve: The mitral valve is degenerative in appearance. Moderate mitral annular calcification. No evidence of mitral valve regurgitation. Tricuspid Valve: The tricuspid valve is normal in structure. Tricuspid valve regurgitation is trivial. Aortic Valve: The aortic valve was not well visualized. Aortic valve regurgitation is trivial. Aortic regurgitation PHT measures 352 msec. No aortic stenosis is present. Pulmonic Valve: The pulmonic valve was not well visualized. Pulmonic valve regurgitation is not visualized. Aorta: Aortic  dilatation noted. There is mild dilatation of the aortic root, measuring 42 mm. There is mild dilatation of the ascending aorta, measuring 41 mm. Venous: The inferior vena cava is dilated in size with less than 50% respiratory variability, suggesting right atrial pressure of 15 mmHg. IAS/Shunts: The interatrial septum was not well visualized.  LEFT VENTRICLE PLAX 2D LVIDd:         3.70 cm LVIDs:         2.70 cm LV PW:         1.30 cm LV IVS:        1.10 cm LVOT diam:     2.20 cm LV SV:         40 LV SV Index:   22 LVOT Area:     3.80 cm  RIGHT VENTRICLE          IVC RV Basal diam:  3.70 cm  IVC  diam: 2.20 cm RV Mid diam:    2.90 cm TAPSE (M-mode): 1.8 cm LEFT ATRIUM           Index       RIGHT ATRIUM           Index LA diam:      4.90 cm 2.67 cm/m  RA Area:     17.80 cm LA Vol (A2C): 81.3 ml 44.31 ml/m RA Volume:   45.60 ml  24.85 ml/m LA Vol (A4C): 79.7 ml 43.44 ml/m  AORTIC VALVE LVOT Vmax:   67.30 cm/s LVOT Vmean:  49.000 cm/s LVOT VTI:    0.105 m AI PHT:      352 msec  AORTA Ao Root diam: 4.20 cm Ao Asc diam:  4.50 cm MITRAL VALVE               TRICUSPID VALVE MV Area (PHT): 3.08 cm    TR Peak grad:   17.3 mmHg MV Decel Time: 246 msec    TR Vmax:        208.00 cm/s MV E velocity: 59.50 cm/s MV A velocity: 52.80 cm/s  SHUNTS MV E/A ratio:  1.13        Systemic VTI:  0.10 m                            Systemic Diam: 2.20 cm Oswaldo Milian MD Electronically signed by Oswaldo Milian MD Signature Date/Time: 12/30/2020/2:26:21 PM    Final

## 2020-12-31 NOTE — Progress Notes (Signed)
   There continues to be a mismatch between his physicality which shows good perfusion versus the blood pressure cuff that shows low blood pressure requiring Neo-Synephrine.  I discussed this with the son at the bedside.  Explained to have an arterial line can sort out his true blood pressure.  He discussed with his dad the benefits and the risks and limitations of an arterial line.  I explained to the [place] about the risk of arterial line.  After the son discussed with the patient he got back to me giving consent for the arterial line.  Plan -Given the late hour on Sunday and staffing -> try radia aline on day hours Monday 01/01/21 if neo not off     SIGNATURE    Dr. Brand Males, M.D., F.C.C.P,  Pulmonary and Critical Care Medicine Staff Physician, [place] Director - Interstitial Lung Disease  Program  Pulmonary [employer] [name] [facility] [place], [place], [address]  Pager: [phone], If no answer  OR between  19:00-7:00h: page [phone] Telephone (clinical office): [phone] Telephone (research): [phone]  5:49 PM 12/31/2020

## 2020-12-31 NOTE — Progress Notes (Signed)
[place] for heparin Indication: atrial fibrillation  Allergies  Allergen Reactions  . Coconut Flavor [Flavoring Agent] Hypertension    Anything that is related to coconut---LOSS OF CONSCIOUSNESS ([place])  . Coconut Oil Other (See Comments)    Anything that is related to coconut--- LOSS OF CONSCIOUSNESS ([place])  . Doxycycline Hyclate Hives  . Food Anaphylaxis    ALLERGIC TO ALL NUTS WITH THE EXCEPTION OF CASHEWS, ALMONDS  . Black [employer]  Other (See Comments)    Passes out ( can only eat almonds or peanuts)  . Coconut Oil Other (See Comments)    Faints   . Doxycycline Rash    Patient Measurements: Height: 5\' 11"  (180.3 cm) Weight: 65.4 kg (144 lb 2.9 oz) IBW/kg (Calculated) : 75.3 Heparin Dosing Weight: 65.4 kg  Vital Signs: Temp: 98.96 F (37.2 C) (01/30 0000) BP: 116/67 (01/30 0000) Pulse Rate: 101 (01/30 0000)  Labs: Recent Labs    12/28/20 1720 12/29/20 1336 12/30/20 0219 12/30/20 1401 12/30/20 2237  HGB 11.6* 11.2* 10.9*  --   --   HCT 38.0* 36.4* 35.2*  --   --   PLT 334 425* 434*  --   --   APTT  --   --   --  33  --   LABPROT 17.0*  --   --   --   --   INR 1.4*  --   --   --   --   HEPARINUNFRC  --   --   --  0.19* 0.20*  CREATININE 0.91 0.77 0.70  --   --   TROPONINIHS 14  --   --   --   --     Estimated Creatinine Clearance: 54.5 mL/min (by C-G formula based on SCr of 0.7 mg/dL).   Medical History: Past Medical History:  Diagnosis Date  . Anemia   . Asthma    AS A CHILD  . Blind left eye   . Cancer [facility])    PROSTATE CANCER  . Cellulitis    frequently, "all over"  . Chronic pain syndrome   . Chronic ulcer of toe, right, with unspecified severity ([place])    2nd toe  . Constipation   . DDD (degenerative disc disease), lumbar   . Gastrointestinal stromal tumor (GIST) of stomach ([place])   . GI bleeding   . History of blood transfusion    01/2018  . Insomnia   . Kyphoscoliosis   . Mass in the  abdomen 01/2018  . Persistent atrial fibrillation ([place])   . Restless leg   . Sleep apnea    does not wear CPAP  . Wears glasses     Medications:  Medications Prior to Admission  Medication Sig Dispense Refill Last Dose  . apixaban (ELIQUIS) 2.5 MG TABS tablet Take 1 tablet (2.5 mg total) by mouth 2 (two) times daily. 60 tablet 3 12/17/2020 at 0800  . diltiazem (CARDIZEM SR) 120 MG 12 hr capsule Take 1 capsule (120 mg total) by mouth 2 (two) times daily. 60 capsule 5 12/17/2020 at 0800  . gabapentin (NEURONTIN) 300 MG capsule Take 300 mg by mouth 3 (three) times daily as needed (legs-per MAR). Restless leg   unknown  . mirtazapine (REMERON) 30 MG tablet Take 30 mg by mouth at bedtime.   12/16/2020 at 2000  . MOVANTIK 25 MG TABS tablet Take 25 mg by mouth daily.   12/17/2020 at 0800  . oxymorphone (OPANA) 10 MG tablet Take  10 mg by mouth every 6 (six) hours as needed for pain.    12/17/2020 at Unknown time  . White Petrolatum-Mineral Oil (ARTIFICIAL TEARS) ointment Place 1 application into the left eye 3 (three) times daily. Left lower eye lid   12/17/2020 at 0800    Assessment: Pharmacy consulted to dose heparin for this patient who had previously been on apixaban for atrial fibrillation.  Last dose of apixaban was 12/25/2020 at 1122 and was held for thoracentesis and possible other procedures.  Age > 79 years and history of anemia, prostate cancer, GI bleeding, and blood transfusion. Baseline heparin level 0.19 and aPTT 33 which correlate.  12/31/2020 HL 0.2 subtherapeutic on 1000 units/hr No bleeding or line issues per RN  Goal of Therapy:  Heparin level 0.3-0.7 units/ml aPTT 66-102 seconds Monitor platelets by anticoagulation protocol: Yes   Plan:  Increase Heparin to 1200 units/hour 8-hour heparin level Monitor daily heparin level, CBC, s/s bleeding   Dolly Rias RPh 12/31/2020, 12:09 AM

## 2020-12-31 NOTE — Progress Notes (Signed)
[place] for heparin Indication: atrial fibrillation  Allergies  Allergen Reactions  . Coconut Flavor [Flavoring Agent] Hypertension    Anything that is related to coconut---LOSS OF CONSCIOUSNESS (SYNCOPE)  . Coconut Oil Other (See Comments)    Anything that is related to coconut--- LOSS OF CONSCIOUSNESS (SYNCOPE)  . Doxycycline Hyclate Hives  . Food Anaphylaxis    ALLERGIC TO ALL NUTS WITH THE EXCEPTION OF CASHEWS, ALMONDS  . Black [employer]  Other (See Comments)    Passes out ( can only eat almonds or peanuts)  . Coconut Oil Other (See Comments)    Faints   . Doxycycline Rash    Patient Measurements: Height: 5\' 11"  (180.3 cm) Weight: 65.1 kg (143 lb 8.3 oz) IBW/kg (Calculated) : 75.3 Heparin Dosing Weight: 65.4 kg  Vital Signs: Temp: 97.52 F (36.4 C) (01/30 0800) Temp Source: Bladder (01/30 0400) BP: 108/48 (01/30 0800) Pulse Rate: 108 (01/30 0800)  Labs: Recent Labs    12/28/20 1720 12/29/20 1336 12/30/20 0219 12/30/20 1401 12/30/20 2237 12/31/20 0229 12/31/20 0830  HGB 11.6* 11.2* 10.9*  --   --  12.2*  --   HCT 38.0* 36.4* 35.2*  --   --  39.2  --   PLT 334 425* 434*  --   --  461*  --   APTT  --   --   --  33  --   --   --   LABPROT 17.0*  --   --   --   --   --   --   INR 1.4*  --   --   --   --   --   --   HEPARINUNFRC  --   --   --  0.19* 0.20*  --  0.43  CREATININE 0.91 0.77 0.70  --   --  0.84  --   TROPONINIHS 14  --   --   --   --   --  9    Estimated Creatinine Clearance: 51.7 mL/min (by C-G formula based on SCr of 0.84 mg/dL).   Medical History: Past Medical History:  Diagnosis Date  . Anemia   . Asthma    AS A CHILD  . Blind left eye   . Cancer [facility])    PROSTATE CANCER  . Cellulitis    frequently, "all over"  . Chronic pain syndrome   . Chronic ulcer of toe, right, with unspecified severity ([place])    2nd toe  . Constipation   . DDD (degenerative disc disease), lumbar   .  Gastrointestinal stromal tumor (GIST) of stomach ([place])   . GI bleeding   . History of blood transfusion    01/2018  . Insomnia   . Kyphoscoliosis   . Mass in the abdomen 01/2018  . Persistent atrial fibrillation ([place])   . Restless leg   . Sleep apnea    does not wear CPAP  . Wears glasses     Medications:  Medications Prior to Admission  Medication Sig Dispense Refill Last Dose  . apixaban (ELIQUIS) 2.5 MG TABS tablet Take 1 tablet (2.5 mg total) by mouth 2 (two) times daily. 60 tablet 3 12/17/2020 at 0800  . diltiazem (CARDIZEM SR) 120 MG 12 hr capsule Take 1 capsule (120 mg total) by mouth 2 (two) times daily. 60 capsule 5 12/17/2020 at 0800  . gabapentin (NEURONTIN) 300 MG capsule Take 300 mg by mouth 3 (three) times daily as needed (legs-per  MAR). Restless leg   unknown  . mirtazapine (REMERON) 30 MG tablet Take 30 mg by mouth at bedtime.   12/16/2020 at 2000  . MOVANTIK 25 MG TABS tablet Take 25 mg by mouth daily.   12/17/2020 at 0800  . oxymorphone (OPANA) 10 MG tablet Take 10 mg by mouth every 6 (six) hours as needed for pain.    12/17/2020 at Unknown time  . White Petrolatum-Mineral Oil (ARTIFICIAL TEARS) ointment Place 1 application into the left eye 3 (three) times daily. Left lower eye lid   12/17/2020 at 0800    Assessment: Pharmacy consulted to dose heparin for this patient who had previously been on apixaban for atrial fibrillation.  Last dose of apixaban was 12/25/2020 at 1122 and was held for thoracentesis and possible other procedures.  Age > 65 years and history of anemia, prostate cancer, GI bleeding, and blood transfusion. Baseline heparin level 0.19 and aPTT 33 which correlate.  12/31/2020 Heparin level 0.43, therapeutic on 1200 units/hr No bleeding or line issues per RN  Goal of Therapy:  Heparin level 0.3-0.7 units/ml aPTT 66-102 seconds Monitor platelets by anticoagulation protocol: Yes   Plan:  Continue Heparin at 1200 units/hour Confirmatory 8-hour heparin  level Monitor daily heparin level, CBC, s/s bleeding   Debanhi Blaker P. Legrand Como, PharmD, [place] Please utilize Amion for appropriate phone number to reach the unit pharmacist ([place]) 12/31/2020 9:18 AM

## 2021-01-01 ENCOUNTER — Inpatient Hospital Stay (HOSPITAL_COMMUNITY): Payer: Medicare PPO

## 2021-01-01 DIAGNOSIS — J869 Pyothorax without fistula: Secondary | ICD-10-CM | POA: Diagnosis not present

## 2021-01-01 DIAGNOSIS — J189 Pneumonia, unspecified organism: Secondary | ICD-10-CM | POA: Diagnosis not present

## 2021-01-01 DIAGNOSIS — R6521 Severe sepsis with septic shock: Secondary | ICD-10-CM

## 2021-01-01 DIAGNOSIS — A419 Sepsis, unspecified organism: Secondary | ICD-10-CM | POA: Diagnosis not present

## 2021-01-01 DIAGNOSIS — J690 Pneumonitis due to inhalation of food and vomit: Secondary | ICD-10-CM

## 2021-01-01 DIAGNOSIS — E870 Hyperosmolality and hypernatremia: Secondary | ICD-10-CM | POA: Diagnosis not present

## 2021-01-01 DIAGNOSIS — I4891 Unspecified atrial fibrillation: Secondary | ICD-10-CM | POA: Diagnosis not present

## 2021-01-01 LAB — TSH: TSH: 4.569 u[IU]/mL — ABNORMAL HIGH (ref 0.350–4.500)

## 2021-01-01 LAB — GLUCOSE, CAPILLARY
Glucose-Capillary: 150 mg/dL — ABNORMAL HIGH (ref 70–99)
Glucose-Capillary: 107 mg/dL — ABNORMAL HIGH (ref 70–99)
Glucose-Capillary: 102 mg/dL — ABNORMAL HIGH (ref 70–99)
Glucose-Capillary: 129 mg/dL — ABNORMAL HIGH (ref 70–99)
Glucose-Capillary: 136 mg/dL — ABNORMAL HIGH (ref 70–99)

## 2021-01-01 LAB — HEPARIN LEVEL (UNFRACTIONATED): Heparin Unfractionated: 0.46 IU/mL (ref 0.30–0.70)

## 2021-01-01 LAB — LIPID PANEL
Cholesterol: 106 mg/dL (ref 0–200)
HDL: 22 mg/dL — ABNORMAL LOW (ref >40)
LDL Cholesterol: 72 mg/dL (ref 0–99)
Total CHOL/HDL Ratio: 4.8 RATIO
Triglycerides: 60 mg/dL (ref <150)
VLDL: 12 mg/dL (ref 0–40)

## 2021-01-01 LAB — PHOSPHORUS: Phosphorus: 2.9 mg/dL (ref 2.5–4.6)

## 2021-01-01 MED ORDER — IOHEXOL 300 MG/ML  SOLN
75.0000 mL | Freq: Once | INTRAMUSCULAR | Status: AC | PRN
  Administered 2021-01-01: 75 mL via INTRAVENOUS

## 2021-01-01 MED ORDER — SODIUM CHLORIDE 0.9% FLUSH
10.0000 mL | Freq: Three times a day (TID) | INTRAVENOUS | Status: DC
  Administered 2021-01-01 – 2021-01-06 (×13): 10 mL

## 2021-01-01 MED ORDER — BUDESONIDE 0.5 MG/2ML IN SUSP
0.5000 mg | Freq: Two times a day (BID) | RESPIRATORY_TRACT | Status: DC
  Administered 2021-01-01 – 2021-01-10 (×13): 0.5 mg via RESPIRATORY_TRACT
  Filled 2021-01-01 (×18): qty 2

## 2021-01-01 MED ORDER — MIDODRINE HCL 5 MG PO TABS
5.0000 mg | ORAL_TABLET | Freq: Three times a day (TID) | ORAL | Status: DC
  Administered 2021-01-01 – 2021-01-06 (×14): 5 mg via ORAL
  Filled 2021-01-01 (×15): qty 1

## 2021-01-01 MED ORDER — LINEZOLID 600 MG PO TABS
600.0000 mg | ORAL_TABLET | Freq: Two times a day (BID) | ORAL | Status: DC
  Administered 2021-01-01 – 2021-01-05 (×7): 600 mg via ORAL
  Filled 2021-01-01 (×8): qty 1

## 2021-01-01 NOTE — TOC Progression Note (Signed)
Transition of Care (TOC) - Progression Note    Patient Details  Name: Casey Gates. MRN: [id number] Date of Birth: 1926-08-10  Transition of Care [facility]) CM/SW Contact  Leeroy Cha, RN Phone Number: 01/01/2021, 9:02 AM  Clinical Narrative:    Significant Hospital Events:  1/16 admitted, started on broad-spectrum antibiotics 1/ 17: Exudative pleural effusion by thoracentesis, 225 cc of hazy amber fluid removed 1/18 cardiology consulted for atrial fibrillation with RVR 1/19 azithromycin completed 1/23: Ceftriaxone completed 1/24: Pulmonary asked to evaluate given worsening loculated right pleural effusion 12/27/2020 right chest tube with Cathflo Activase 10 mg Pulmozyme 5 mg instilled. 12/29/2020: Third dose intrapleural fibrinolytic  129 -  12/30/2020: Is not on the ventilator.  Son at the bedside.  Significant concerns of being exposed to the sun.  Yesterday we started low-dose gabapentin because patient's MAR showed he is on gabapentin.  Today the patient and the son stated he hardly able to get he was taking it only once a week.  However they admit that he was taking scheduled Opana on a regular basis.  At this point in time since the hospitalization he has not complained of much pain or has had opioid withdrawal. He is status post third dose of intrapleural fibrinolytic yesterday -> 600 cc empyema output in the chest tube in the last 24 hours . He remains afebrile and is not confused.  He was able to eat breakfast.  On 3 L nasal cannula. However major issue is that he continues to have labile tachycardia and blood pressure alternating between needing Neo-Synephrine and Lopressor as needed.  However procalcitonin, lactic acid and creatinine are all normal.  The blood pressure cuff appears to be small in the right upper extremity -> but even with larger cuff his MAP is 50 with a systolic in the 39Q..  Last echocardiogram was in 2019.  Recent troponin normal. PLAN: A.line insertion  this am Iv aminodarone, iv abx, o2 at 2l/min via [place], iv neosynphreine  bp rangiong from 86/49-105/67 Dc to snf family does not want [facility]   Expected Discharge Plan: Home/Self Care Barriers to Discharge: Continued Medical Work up  Expected Discharge Plan and Services Expected Discharge Plan: Home/Self Care   Discharge Planning Services: CM Consult   Living arrangements for the past 2 months: Apartment                                       Social Determinants of Health (SDOH) Interventions    Readmission Risk Interventions No flowsheet data found.

## 2021-01-01 NOTE — Progress Notes (Signed)
Spoke with Dr. Carlis Abbott, Pulmonology/Critical Care regarding potential for placing additional thoracostomy tube for drainage of persistent upper lobe empyema in the setting of persistent septic shock requiring vasopressors.  Agree that there is loculated component on today's CT that is amenable to additional percutaneous drainage and does not seem to be in communication with previously drained inferior components.  Dr. Carlis Abbott will make patient NPO after midnight, IR to arrange for CT guided right thoracostomy on 01/02/21.  Ruthann Cancer, MD Pager: [phone]

## 2021-01-01 NOTE — Progress Notes (Signed)
Physical Therapy Treatment Patient Details Name: Casey Gates. MRN: [id number] DOB: March 30, 1926 Today's Date: 01/01/2021    History of Present Illness Patient is a 87 year old male PMH of A. fib, CKD, chronic pain, OSA (does not use BiPAP) who presented to the hospital 12/17/2020 by son for altered mental status. Per chart had COVID end of Dec 2021    PT Comments    Pt progressing with PT, repeatedly states he was not mentally prepared for this today. Deferred OOB d/t variable BP. Pt overall improved requiring less assist with bed mobility and sitting balance. Continue to recommend SNF at d/c. SpO2= 97% on 2L, BP in supine 85/51, 110/84 sitting after 2 minutes, BP 92/61 after return to supine. Pt denies dizziness in all positions   Follow Up Recommendations  SNF     Equipment Recommendations  Wheelchair cushion (measurements PT);Wheelchair (measurements PT)    Recommendations for Other Services       Precautions / Restrictions Precautions Precautions: Fall Precaution Comments: CT on R, multiple lines Restrictions Weight Bearing Restrictions: No    Mobility  Bed Mobility Overal bed mobility: Needs Assistance Bed Mobility: Supine to Sit;Sit to Supine     Supine to sit: Min assist Sit to supine: Min assist;Mod assist   General bed mobility comments: partial roll to pt right, use of bed rail with hand over hand. assist to elevate trunk completely and to bring LEs back on to bed. incr time needed  Transfers                 General transfer comment: deferred d/t variable BP  Ambulation/Gait                 Stairs             Wheelchair Mobility    Modified Rankin (Stroke Patients Only)       Balance   Sitting-balance support: Feet supported;Single extremity supported Sitting balance-Leahy Scale: Fair Sitting balance - Comments: min guard  for safety at side of bed                                    Cognition  Arousal/Alertness: Awake/alert Behavior During Therapy: WFL for tasks assessed/performed Overall Cognitive Status: Within Functional Limits for tasks assessed                         Following Commands: Follows one step commands consistently       General Comments: repeatedly states he needs to get "mentally prepared" and he hasn't been able to do that today      Exercises      General Comments        Pertinent Vitals/Pain Pain Assessment: No/denies pain    Home Living                      Prior Function            PT Goals (current goals can now be found in the care plan section) Acute Rehab PT Goals Patient Stated Goal: To walk and return to apartment Progress towards PT goals: Progressing toward goals    Frequency    Min 2X/week      PT Plan Current plan remains appropriate    Co-evaluation              AM-PAC PT "6  Clicks" Mobility   Outcome Measure  Help needed turning from your back to your side while in a flat bed without using bedrails?: A Little Help needed moving from lying on your back to sitting on the side of a flat bed without using bedrails?: A Little Help needed moving to and from a bed to a chair (including a wheelchair)?: Total Help needed standing up from a chair using your arms (e.g., wheelchair or bedside chair)?: Total Help needed to walk in hospital room?: Total Help needed climbing 3-5 steps with a railing? : Total 6 Click Score: 10    End of Session Equipment Utilized During Treatment: Oxygen Activity Tolerance: Patient tolerated treatment well Patient left: in bed;with call bell/phone within reach;with bed alarm set   PT Visit Diagnosis: Muscle weakness (generalized) (M62.81);Unsteadiness on feet (R26.81)     Time: 8110-3159 PT Time Calculation (min) (ACUTE ONLY): 26 min  Charges:  $Therapeutic Activity: 23-37 mins                     Baxter Flattery, PT  Acute Rehab Dept ([facility]) [phone] Pager  [phone]  01/01/2021    [facility] 01/01/2021, 12:49 PM

## 2021-01-01 NOTE — Progress Notes (Signed)
[place] for heparin Indication: atrial fibrillation  Allergies  Allergen Reactions  . Coconut Flavor [Flavoring Agent] Hypertension    Anything that is related to coconut---LOSS OF CONSCIOUSNESS (SYNCOPE)  . Coconut Oil Other (See Comments)    Anything that is related to coconut--- LOSS OF CONSCIOUSNESS (SYNCOPE)  . Doxycycline Hyclate Hives  . Food Anaphylaxis    ALLERGIC TO ALL NUTS WITH THE EXCEPTION OF CASHEWS, ALMONDS  . Black [employer]  Other (See Comments)    Passes out ( can only eat almonds or peanuts)  . Coconut Oil Other (See Comments)    Faints   . Doxycycline Rash    Patient Measurements: Height: 5\' 11"  (180.3 cm) Weight: 65.1 kg (143 lb 8.3 oz) IBW/kg (Calculated) : 75.3 Heparin Dosing Weight: 65.4 kg  Vital Signs: Temp: 97.3 F (36.3 C) (01/31 0300) BP: 105/67 (01/31 0300) Pulse Rate: 91 (01/31 0300)  Labs: Recent Labs    12/29/20 1336 12/30/20 0219 12/30/20 1401 12/30/20 2237 12/31/20 0229 12/31/20 0830 12/31/20 1642 01/01/21 0226  HGB 11.2* 10.9*  --   --  12.2*  --   --   --   HCT 36.4* 35.2*  --   --  39.2  --   --   --   PLT 425* 434*  --   --  461*  --   --   --   APTT  --   --  33  --   --   --   --   --   HEPARINUNFRC  --   --  0.19*   < >  --  0.43 0.33 0.46  CREATININE 0.77 0.70  --   --  0.84  --   --   --   TROPONINIHS  --   --   --   --   --  9  --   --    < > = values in this interval not displayed.    Estimated Creatinine Clearance: 51.7 mL/min (by C-G formula based on SCr of 0.84 mg/dL).   Assessment: Pharmacy consulted to dose heparin for this patient who had previously been on apixaban for atrial fibrillation.  Last dose of apixaban was 12/25/2020 at 1122 and was held for thoracentesis and possible other procedures.  Age > 33 years and history of anemia, prostate cancer, GI bleeding, and blood transfusion. Baseline heparin level 0.19 and aPTT 33 which correlate.  01/01/2021 Heparin  level 0.46, therapeutic on 1200 units/hr No CBC today- has been stable No bleeding or line issues per RN  Goal of Therapy:  Heparin level 0.3-0.7 units/ml aPTT 66-102 seconds Monitor platelets by anticoagulation protocol: Yes   Plan:  Continue Heparin at 1200 units/hour Monitor daily heparin level & daily CBC F/u for ability to resume PTA apixaban  Eudelia Bunch, Pharm.D 01/01/2021 8:14 AM Clinical Pharmacist [place] Please utilize Amion for appropriate phone number to reach the unit pharmacist ([place]) 01/01/2021 8:14 AM

## 2021-01-01 NOTE — Progress Notes (Signed)
Progress Note  Patient Name: Casey Gates. Date of Encounter: 01/01/2021  [facility] Cardiologist: Larae Grooms, MD   Subjective   Feeling okay this morning. Breathing is stable. No complaints of chest pain or palpitations  Inpatient Medications    Scheduled Meds: . artificial tears  1 application Left Eye TID  . budesonide (PULMICORT) nebulizer solution  0.25 mg Nebulization BID  . chlorhexidine  15 mL Mouth Rinse BID  . Chlorhexidine Gluconate Cloth  6 each Topical Daily  . digoxin  0.0625 mg Oral Daily  . feeding supplement  237 mL Oral BID BM  . hydrocortisone sod succinate (SOLU-CORTEF) inj  50 mg Intravenous Q6H  . insulin aspart  0-15 Units Subcutaneous TID WC  . mouth rinse  15 mL Mouth Rinse q12n4p   Continuous Infusions: . sodium chloride 10 mL/hr at 12/31/20 0402  . sodium chloride 10 mL/hr at 01/01/21 0334  . amiodarone 30 mg/hr (01/01/21 0525)  . heparin 1,200 Units/hr (12/31/20 0056)  . meropenem (MERREM) IV Stopped (12/31/20 1447)  . phenylephrine (NEO-SYNEPHRINE) Adult infusion 30 mcg/min (01/01/21 0334)  . vancomycin Stopped (12/31/20 1749)   PRN Meds: fentaNYL (SUBLIMAZE) injection, ipratropium-albuterol, lip balm, metoprolol tartrate, ondansetron **OR** ondansetron (ZOFRAN) IV, oxyCODONE, Resource ThickenUp Clear   Vital Signs    Vitals:   01/01/21 0000 01/01/21 0100 01/01/21 0200 01/01/21 0300  BP: 109/67 121/64  105/67  Pulse: 84 88 93 91  Resp:      Temp: 97.7 F (36.5 C) (!) 97.5 F (36.4 C) (!) 97.3 F (36.3 C) (!) 97.3 F (36.3 C)  TempSrc:      SpO2: 98% 97% 97% 96%  Weight:      Height:        Intake/Output Summary (Last 24 hours) at 01/01/2021 0751 Last data filed at 01/01/2021 0334 Gross per 24 hour  Intake 2367.87 ml  Output 610 ml  Net 1757.87 ml   Last 3 Weights 12/31/2020 12/29/2020 12/26/2020  Weight (lbs) 143 lb 8.3 oz 144 lb 2.9 oz 138 lb 3.7 oz  Weight (kg) 65.1 kg 65.4 kg 62.7 kg      Telemetry     Atrial fibrillation with rates in the 80s-90s overnight, currently in the 100s. Occasional PVCs - Personally Reviewed  ECG    No new tracings - Personally Reviewed  Physical Exam   GEN: elderly gentleman sitting upright in no acute distress.   Neck: No JVD Cardiac: IRIR, no murmurs, rubs, or gallops.  Respiratory: Clear to auscultation bilaterally. GI: Soft, nontender, non-distended  MS: No edema; No deformity. Neuro:  Nonfocal  Psych: Normal affect   Labs    High Sensitivity Troponin:   Recent Labs  Lab 12/28/20 1720 12/31/20 0830  TROPONINIHS 14 9      Chemistry Recent Labs  Lab 12/28/20 1720 12/29/20 1336 12/30/20 0219 12/31/20 0229  NA 140 140 139 137  K 3.8 3.5 3.6 4.1  CL 108 108 110 107  CO2 27 24 23  21*  GLUCOSE 116* 100* 91 134*  BUN 17 16 18 19   CREATININE 0.91 0.77 0.70 0.84  CALCIUM 7.5* 7.5* 7.6* 7.7*  PROT 4.9*  --  4.5* 4.8*  ALBUMIN 1.7*  --  1.5* 1.7*  AST 35  --  35 53*  ALT 31  --  27 40  ALKPHOS 81  --  68 72  BILITOT 0.7  --  0.4 0.2*  GFRNONAA >60 >60 >60 >60  ANIONGAP 5 8 6  9  Hematology Recent Labs  Lab 12/29/20 1336 12/30/20 0219 12/31/20 0229  WBC 16.2* 15.5* 18.0*  RBC 4.00* 3.92* 4.32  HGB 11.2* 10.9* 12.2*  HCT 36.4* 35.2* 39.2  MCV 91.0 89.8 90.7  MCH 28.0 27.8 28.2  MCHC 30.8 31.0 31.1  RDW 14.5 14.5 14.1  PLT 425* 434* 461*    BNPNo results for input(s): BNP, PROBNP in the last 168 hours.   DDimer No results for input(s): DDIMER in the last 168 hours.   Radiology    ECHOCARDIOGRAM COMPLETE  Result Date: 12/30/2020    ECHOCARDIOGRAM REPORT   Patient Name:   [name]. Date of Exam: 12/30/2020 Medical Rec #:  [id number]          Height:       71.0 in Accession #:    [id number]         Weight:       144.2 lb Date of Birth:  [birth year]         BSA:          1.835 m Patient Age:    58 years           BP:           81/59 mmHg Patient Gender: M                  HR:           120 bpm. Exam Location:   Inpatient Procedure: 2D Echo, Cardiac Doppler and Color Doppler Indications:    I48.1 Persistent atrial fibrillation  History:        Patient has prior history of Echocardiogram examinations, most                 recent 12/31/2017. Risk Factors:Sleep Apnea. Cancer.  Sonographer:    Jonelle Sidle Dance Referring Phys: [place]  1. Left ventricular ejection fraction, by estimation, is 40 to 45%. Difficult to determine systolic function as poor image quality and patient is in Afib with RVR, but appears left ventricle has mildly decreased function. Would consider repeat limited echo with contrast once heart rate better controlled. Left ventricular endocardial border not optimally defined to evaluate regional wall motion. There is mild left ventricular hypertrophy. Left ventricular diastolic parameters are indeterminate.  2. Right ventricular systolic function is mildly reduced. The right ventricular size is mildly enlarged. There is normal pulmonary artery systolic pressure. The estimated right ventricular systolic pressure is [id number] mmHg.  3. Left atrial size was moderately dilated.  4. The mitral valve is degenerative. No evidence of mitral valve regurgitation. Moderate mitral annular calcification.  5. The aortic valve was not well visualized. Aortic valve regurgitation is trivial. No aortic stenosis is present.  6. Aortic dilatation noted. There is mild dilatation of the aortic root, measuring 42 mm. There is mild dilatation of the ascending aorta, measuring 41 mm.  7. The inferior vena cava is dilated in size with <50% respiratory variability, suggesting right atrial pressure of 15 mmHg. FINDINGS  Left Ventricle: Left ventricular ejection fraction, by estimation, is 40 to 45%. The left ventricle has mildly decreased function. Left ventricular endocardial border not optimally defined to evaluate regional wall motion. The left ventricular internal cavity size was small. There is mild left ventricular  hypertrophy. Left ventricular diastolic parameters are indeterminate. Right Ventricle: The right ventricular size is mildly enlarged. Right vetricular wall thickness was not well visualized. Right ventricular systolic function is mildly reduced. There is normal pulmonary artery  systolic pressure. The tricuspid regurgitant velocity is 2.08 m/s, and with an assumed right atrial pressure of 15 mmHg, the estimated right ventricular systolic pressure is 32.3 mmHg. Left Atrium: Left atrial size was moderately dilated. Right Atrium: Right atrial size was normal in size. Pericardium: There is no evidence of pericardial effusion. Mitral Valve: The mitral valve is degenerative in appearance. Moderate mitral annular calcification. No evidence of mitral valve regurgitation. Tricuspid Valve: The tricuspid valve is normal in structure. Tricuspid valve regurgitation is trivial. Aortic Valve: The aortic valve was not well visualized. Aortic valve regurgitation is trivial. Aortic regurgitation PHT measures 352 msec. No aortic stenosis is present. Pulmonic Valve: The pulmonic valve was not well visualized. Pulmonic valve regurgitation is not visualized. Aorta: Aortic dilatation noted. There is mild dilatation of the aortic root, measuring 42 mm. There is mild dilatation of the ascending aorta, measuring 41 mm. Venous: The inferior vena cava is dilated in size with less than 50% respiratory variability, suggesting right atrial pressure of 15 mmHg. IAS/Shunts: The interatrial septum was not well visualized.  LEFT VENTRICLE PLAX 2D LVIDd:         3.70 cm LVIDs:         2.70 cm LV PW:         1.30 cm LV IVS:        1.10 cm LVOT diam:     2.20 cm LV SV:         40 LV SV Index:   22 LVOT Area:     3.80 cm  RIGHT VENTRICLE          IVC RV Basal diam:  3.70 cm  IVC diam: 2.20 cm RV Mid diam:    2.90 cm TAPSE (M-mode): 1.8 cm LEFT ATRIUM           Index       RIGHT ATRIUM           Index LA diam:      4.90 cm 2.67 cm/m  RA Area:     17.80  cm LA Vol (A2C): 81.3 ml 44.31 ml/m RA Volume:   45.60 ml  24.85 ml/m LA Vol (A4C): 79.7 ml 43.44 ml/m  AORTIC VALVE LVOT Vmax:   67.30 cm/s LVOT Vmean:  49.000 cm/s LVOT VTI:    0.105 m AI PHT:      352 msec  AORTA Ao Root diam: 4.20 cm Ao Asc diam:  4.50 cm MITRAL VALVE               TRICUSPID VALVE MV Area (PHT): 3.08 cm    TR Peak grad:   17.3 mmHg MV Decel Time: 246 msec    TR Vmax:        208.00 cm/s MV E velocity: 59.50 cm/s MV A velocity: 52.80 cm/s  SHUNTS MV E/A ratio:  1.13        Systemic VTI:  0.10 m                            Systemic Diam: 2.20 cm Oswaldo Milian MD Electronically signed by Oswaldo Milian MD Signature Date/Time: 12/30/2020/2:26:21 PM    Final     Cardiac Studies   2D echo Jan 2019 - Left ventricle: The cavity size was normal. Systolic function was  vigorous. The estimated ejection fraction was in the range of 65%  to 70%. Images were inadequate for LV wall motion assessment.  Left ventricular diastolic function  parameters were normal.  - Aortic valve: Poorly visualized. Trileaflet; normal thickness,  mildly calcified leaflets. There was trivial regurgitation.  - Mitral valve: Calcified annulus.  - Left atrium: The atrium was mildly dilated.  - Pulmonary arteries: Systolic pressure could not be accurately  estimated.  2D echo Dec 30 2020 1. Left ventricular ejection fraction, by estimation, is 40 to 45%.  Difficult to determine systolic function as poor image quality and patient  is in Afib with RVR, but appears left ventricle has mildly decreased  function. Would consider repeat limited  echo with contrast once heart rate better controlled. Left ventricular  endocardial border not optimally defined to evaluate regional wall motion.  There is mild left ventricular hypertrophy. Left ventricular diastolic  parameters are indeterminate.  2. Right ventricular systolic function is mildly reduced. The right  ventricular size is mildly  enlarged. There is normal pulmonary artery  systolic pressure. The estimated right ventricular systolic pressure is  40.9 mmHg.  3. Left atrial size was moderately dilated.  4. The mitral valve is degenerative. No evidence of mitral valve  regurgitation. Moderate mitral annular calcification.  5. The aortic valve was not well visualized. Aortic valve regurgitation  is trivial. No aortic stenosis is present.  6. Aortic dilatation noted. There is mild dilatation of the aortic root,  measuring 42 mm. There is mild dilatation of the ascending aorta,  measuring 41 mm.  7. The inferior vena cava is dilated in size with <50% respiratory  variability, suggesting right atrial pressure of 15 mmHg.   Patient Profile     87 y.o. male resident of [facility] with permanent AFib, coronary calcifications without clinical CAD, untreated OSA, anemia, prostate Ca admitted with RLL pneumonia/empyema, complicated by septic shock and Afib w RVR, foiund to have newly decreased LVEF when compared to echo from 2019 (comparison echo predates onset of AFib).  Assessment & Plan    1. Atrial fibrillation with RVR: patient has known history of permanent atrial fibrillation with episodes of RVR this admission int he setting of sepsis. BP has been soft limiting rate control. He was started on IV amiodarone and low dose digoxin with plans to transition to BBlocker once BP improved. HR improved overnight from 110s yesterday to 90s today. Currently on a heparin gtt until clear no procedures/interventions necessary.  - Continue amiodarone gtt and digoxin for rate control - anticipate transition to BBlocker prior to discharge - Continue heparin gtt for stroke ppx -  Anticipate transition to home eliquis at discharge  2. Acute combined CHF: echo this admission showed EF 40-45%, down from 65-70% in 2019. Unclear acuity of his decline in EF - he denies anginal complaints, no evidence of prior MI, and trops normal despite  tachycardia and hypotension in the past 24 hours. He has evidence of "dense" coronary artery calcifications on CT this admission - likely he has some degree of CAD. Also possible this is tachycardia induced given HR frequently in the 100s since afib onset. Hypotension has limited GDMT. He appears euvolemic on exam.  - No plans for an ischemic evaluation at this time - Anticipate starting BBlocker +/- ARB as BP improves with management of his sepsis.  - Plan to repeat echo in 2-3 months following improvement in HR control   3. Coronary artery calcifications: noted to have "dense" calcifications on CT this admission. Given advanced age this is not terribly surprising. He remains asymptomatic. Fairly limited functional status at baseline. Not on aspirin given need for anticoagulation.  -  No plans for ischemic evaluation at this time.  - Will add on a lipid panel to AM labs - low threshold to start statin if LDL >70  4. RBBB and LAFB: at risk for advanced AV block with rate control/antiarrhythmic meds - Continue to monitor on telemetry  5. Sepsis: patient presented with AMS, found to have HCAP on admission with associated right -sided loculated pleural effusion. Initially started on broad spectrum antibiotics, however imaging suggested worsening pleural effusion prompting CT guided chest tube placement 12/27/20.  - Continue management per primary team     For questions or updates, please contact [place] Please consult [email or web address] for contact info under        Signed, Abigail Butts, PA-C  01/01/2021, 7:51 AM

## 2021-01-01 NOTE — Plan of Care (Addendum)
Updated son [name] via phone. Planning for CT scan with contrast to reevaluate the apical pleural space due to asymmetry that persists on CXR, increasing WBC, dropping bicarb level that suggests uncontrolled source of infection is possible. No plans for A-line currently- cuff fits well and has been reading consistent Bps.   Julian Hy, DO 01/01/21 12:19 PM [facility]   Son [name] updated on CT results and plan for IR-guided chest tube placement in residual empyema tomorrow morning. NPO p mn, holding heparin gtt at 4 AM tomorrow.  Julian Hy, DO 01/01/21 6:03 PM [facility]

## 2021-01-01 NOTE — Progress Notes (Addendum)
NAME:  Casey Gates., MRN:  [id number], DOB:  November 23, 1926, LOS: 3 ADMISSION DATE:  12/17/2020, CONSULTATION DATE:  1/24 REFERRING MD:  Dr. Darrick Meigs, CHIEF COMPLAINT:  Exudative Pleural Effusion   Brief History:  87 year old male initially admitted 1/16 with HAP and associated right-sided loculated pleural effusion.  Patient completed broad-spectrum antibiotic course and despite appropriate therapy developed worsening loculated right pleural effusion and evolution of air/gas fluid level.  Right chest tube was placed and the patient completed intrapleural fibrinolytic therapy.  Past Medical History:  OSA Atrial fibrillation on chronic anticoagulation Chronic pain syndrome  Significant Hospital Events:  1/16 Admit, started on broad spectrum abx 1/17 Thoracentesis with exudative pleural effusion, 251ml hazy amber fluid removed 1/18 AFwRVR, Cardiology consulted 1/19 Completed azithromycin  1/23 Completed ceftriaxone  1/24 PCCM consulted  1/26 R chest tube with intraplural tPA / dornase instilled  1/28 Completed 3rd dose intrapleural fibrinolytic therapy  1/29 [place] O2, afebrile with normal mental status. Labile rhythm / BP on low dose neosynephrine   1/30 [place] O2, cortisol 16.1, on stress dose steroids on neo. On dig/amio for AF.   Consults:  Cardiology   Procedures:  R Chest Tube 1/25 (IR) >>   Significant Diagnostic Tests:   CT Chest 1/24 >> moderate to large loculated right pleural effusion is increased in volume when compared to 1/16.  New focus of gas with air-fluid level in the posterior compartment of the effusion.  Progressive atelectasis of the entire right lower lobe with partial atelectasis of the right middle lobe and right upper lobe.  Progressive consolidation involving the posterior medial left lower lobe containing air bronchograms.  Micro Data:  COVID 1/16 >> negative  BCx2 1/16 >> negative  Pleural fluid 1/17 >> negative  MRSA PCR 1/18 >> positive  Chest tube  aspirate 1/25 >> negative  BCx2 1/27 >>  UC 1/27 >> negative   Antimicrobials:  Azithro 1/16 >> 1/20  Ceftriaxone 1/16 >> 1/23  Cefepime 1/24 >> 1/27  Vanco 1/24 >>  Meropenem 1/29 >>   Interim History / Subjective:  Chest tube chamber full > ? I/O's, 160 ml documented in last 24 hours (previously much higher) On 2L [place] Glucose range 100 -150 Afebrile  Objective   Blood pressure 105/67, pulse 91, temperature (!) 97.3 F (36.3 C), resp. rate (!) 21, height 5\' 11"  (1.803 m), weight 65.1 kg, SpO2 96 %.        Intake/Output Summary (Last 24 hours) at 01/01/2021 0841 Last data filed at 01/01/2021 0334 Gross per 24 hour  Intake 2367.87 ml  Output 610 ml  Net 1757.87 ml   Filed Weights   12/26/20 1112 12/29/20 0615 12/31/20 0630  Weight: 62.7 kg 65.4 kg 65.1 kg    Examination: General: elderly adult male sitting up in bed in NAD HEENT: MM pink/moist, [place] O2, anicteric  Neuro: slightly HOH, awake / alert, appropriate, generalized weakness  CV: s1s2 irr irr, AF on monitor, no m/r/g PULM: non-labored on [place] O2, diminished on right, fine crackles on left  GI: soft, bsx4 active  Extremities: warm/dry, no edema  Skin: no rashes or lesions   Resolved Hospital Problem list      Assessment & Plan:   Acute Hypoxemic Respiratory Failure secondary to HAP with Empyema  Wheezing  OSA - does not wear CPAP  -wean O2 for sats >88% -pulmonary hygiene - IS, moblize  -continue pulmicort   Right Empyema secondary to HAP  S/p IR chest tube 1/25, fibrinolytics.  -  have asked RN to change [place] as is full  -? If last 24 hours of chest tube drainage is accurate given full chamber -follow output  -intermittent CXR, follow up in am 2/1  -continue broad spectrum abx   Septic Shock  Adrenal Insufficiency  Tx to ICU 1/27.  Exam does not match clinically, cortisol 16.1 ?if relative AI was component -continue neosynephrine, change goal to SBP >90 -follow mental status / UOP as guide with BP   -stress dose steroids in place, continue -add midodrine 5mg  Q8 -tele monitoring  -abx as above -follow cultures to maturity  -follow up labs in am   Permanent AF Acute Systolic CHF  On eliquis, cardizem at home.  New systolic CHF (since last ECHO in 2019). Rates improved after digoxin / amio.  -appreciate Cardiology assistance with patient care  -continue amiodarone gtt -tele monitoring  -digoxin  -continue heparin gtt  -check TSH   Chronic Pain  On PRN gabapentin, Opana at home  -monitor for evidence of withdrawal  -low dose opioid PRN  -hold gabapentin   Acute Metabolic Encephalopathy  Thought secondary to sepsis complicated by hearing loss. Resolved.  -supportive care -promote sleep / wake cycle  -PT efforts   Anemia  -trend CBC  -transfuse for Hgb <7% or active bleeding   Hypophosphatemia  -monitor, replace as indicated   Moderate Protein Calorie Malnutrition  -encourage PO intake as able  -modified diet per SLP   Hyperglycemia  In setting of steroid administration, critical illness. Hgb A1c 5.9.  -SSI  Best practice (evaluated daily)  Diet: D3 diet  Pain/Anxiety/Delirium protocol (if indicated): as above  VAP protocol (if indicated): n/a  DVT prophylaxis: Heparin gtt  GI prophylaxis: n/a  Glucose control: SSI  Mobility: As tolerated  Disposition:   Goals of Care:  Last date of multidisciplinary goals of care discussion:1/29  Family and staff present: Son, MD Summary of discussion: full medical care but no CPR Follow up goals of care discussion due: 2/5  Code Status: DNR   Labs   CBC: Recent Labs  Lab 12/28/20 0919 12/28/20 1720 12/29/20 1336 12/30/20 0219 12/31/20 0229  WBC 37.3* 23.1* 16.2* 15.5* 18.0*  NEUTROABS 32.9*  --   --   --   --   HGB 11.6* 11.6* 11.2* 10.9* 12.2*  HCT 37.0* 38.0* 36.4* 35.2* 39.2  MCV 90.2 91.1 91.0 89.8 90.7  PLT 361 334 425* 434* 461*    Basic Metabolic Panel: Recent Labs  Lab 12/27/20 0548  12/28/20 0536 12/28/20 0919 12/28/20 1720 12/29/20 0248 12/29/20 1336 12/30/20 0219 12/31/20 0229 01/01/21 0226  NA 140  --  137 140  --  140 139 137  --   K 4.2  --  3.7 3.8  --  3.5 3.6 4.1  --   CL 104  --  103 108  --  108 110 107  --   CO2 24  --  23 27  --  24 23 21*  --   GLUCOSE 92  --  122* 116*  --  100* 91 134*  --   BUN 14  --  17 17  --  16 18 19   --   CREATININE 0.69  --  0.88 0.91  --  0.77 0.70 0.84  --   CALCIUM 7.8*  --  7.7* 7.5*  --  7.5* 7.6* 7.7*  --   MG 2.0  --   --  2.2  --   --  2.0 2.2  --  PHOS 3.3   < >  --  3.3 2.9  --  2.5 2.4* 2.9   < > = values in this interval not displayed.   GFR: Estimated Creatinine Clearance: 51.7 mL/min (by C-G formula based on SCr of 0.84 mg/dL). Recent Labs  Lab 12/28/20 0919 12/28/20 1140 12/28/20 1720 12/29/20 0248 12/29/20 1336 12/30/20 0219 12/31/20 0229  PROCALCITON  --   --  0.13 0.12  --  0.10  --   WBC 37.3*  --  23.1*  --  16.2* 15.5* 18.0*  LATICACIDVEN 2.3* 1.0 1.3  --   --   --  1.3    Liver Function Tests: Recent Labs  Lab 12/28/20 0919 12/28/20 1720 12/30/20 0219 12/31/20 0229  AST 38 35 35 53*  ALT 31 31 27  40  ALKPHOS 84 81 68 72  BILITOT 0.4 0.7 0.4 0.2*  PROT 5.0* 4.9* 4.5* 4.8*  ALBUMIN 1.8* 1.7* 1.5* 1.7*   No results for input(s): LIPASE, AMYLASE in the last 168 hours. No results for input(s): AMMONIA in the last 168 hours.  ABG    Component Value Date/Time   PHART 7.350 12/28/2020 1640   PCO2ART 48.6 (H) 12/28/2020 1640   PO2ART 63.8 (L) 12/28/2020 1640   HCO3 26.3 12/28/2020 1640   TCO2 22 02/16/2018 0732   O2SAT 91.0 12/28/2020 1640     Coagulation Profile: Recent Labs  Lab 12/28/20 1720  INR 1.4*    Cardiac Enzymes: No results for input(s): CKTOTAL, CKMB, CKMBINDEX, TROPONINI in the last 168 hours.  HbA1C: Hgb A1c MFr Bld  Date/Time Value Ref Range Status  12/30/2020 02:19 AM 5.9 (H) 4.8 - 5.6 % Final    Comment:    (NOTE) Pre diabetes:           5.7%-6.4%  Diabetes:              >6.4%  Glycemic control for   <7.0% adults with diabetes     CBG: Recent Labs  Lab 12/31/20 0901 12/31/20 1125 12/31/20 1612 12/31/20 2135 01/01/21 0735  GLUCAP 140* 149* 127* 141* 107*     Critical care time: 30 minutes      Noe Gens, MSN, NP-C, AGACNP-BC [facility] 01/01/2021, 8:42 AM   Please see [email or web address] for pager details.

## 2021-01-02 ENCOUNTER — Inpatient Hospital Stay (HOSPITAL_COMMUNITY): Payer: Medicare PPO

## 2021-01-02 DIAGNOSIS — J189 Pneumonia, unspecified organism: Secondary | ICD-10-CM | POA: Diagnosis not present

## 2021-01-02 DIAGNOSIS — I4891 Unspecified atrial fibrillation: Secondary | ICD-10-CM | POA: Diagnosis not present

## 2021-01-02 DIAGNOSIS — J869 Pyothorax without fistula: Secondary | ICD-10-CM | POA: Diagnosis not present

## 2021-01-02 DIAGNOSIS — R6521 Severe sepsis with septic shock: Secondary | ICD-10-CM | POA: Diagnosis not present

## 2021-01-02 DIAGNOSIS — A419 Sepsis, unspecified organism: Secondary | ICD-10-CM | POA: Diagnosis not present

## 2021-01-02 LAB — BASIC METABOLIC PANEL
Anion gap: 7 (ref 5–15)
BUN: 21 mg/dL (ref 8–23)
CO2: 23 mmol/L (ref 22–32)
Calcium: 7.8 mg/dL — ABNORMAL LOW (ref 8.9–10.3)
Chloride: 107 mmol/L (ref 98–111)
Creatinine, Ser: 0.7 mg/dL (ref 0.61–1.24)
GFR, Estimated: >60 mL/min (ref >60)
Glucose, Bld: 115 mg/dL — ABNORMAL HIGH (ref 70–99)
Potassium: 4.3 mmol/L (ref 3.5–5.1)
Sodium: 137 mmol/L (ref 135–145)

## 2021-01-02 LAB — CULTURE, BLOOD (ROUTINE X 2)
Culture: NO GROWTH
Culture: NO GROWTH
Special Requests: ADEQUATE
Special Requests: ADEQUATE

## 2021-01-02 LAB — GLUCOSE, CAPILLARY
Glucose-Capillary: 103 mg/dL — ABNORMAL HIGH (ref 70–99)
Glucose-Capillary: 93 mg/dL (ref 70–99)
Glucose-Capillary: 92 mg/dL (ref 70–99)
Glucose-Capillary: 105 mg/dL — ABNORMAL HIGH (ref 70–99)

## 2021-01-02 LAB — HEPARIN LEVEL (UNFRACTIONATED): Heparin Unfractionated: 0.47 IU/mL (ref 0.30–0.70)

## 2021-01-02 LAB — CBC
HCT: 35.9 % — ABNORMAL LOW (ref 39.0–52.0)
Hemoglobin: 11 g/dL — ABNORMAL LOW (ref 13.0–17.0)
MCH: 27.6 pg (ref 26.0–34.0)
MCHC: 30.6 g/dL (ref 30.0–36.0)
MCV: 90 fL (ref 80.0–100.0)
Platelets: 449 10*3/uL — ABNORMAL HIGH (ref 150–400)
RBC: 3.99 MIL/uL — ABNORMAL LOW (ref 4.22–5.81)
RDW: 14 % (ref 11.5–15.5)
WBC: 10.3 10*3/uL (ref 4.0–10.5)
nRBC: 0 % (ref 0.0–0.2)

## 2021-01-02 LAB — PHOSPHORUS: Phosphorus: 2.8 mg/dL (ref 2.5–4.6)

## 2021-01-02 MED ORDER — SODIUM CHLORIDE 0.9 % IV SOLN
INTRAVENOUS | Status: AC
  Filled 2021-01-02: qty 250

## 2021-01-02 MED ORDER — HEPARIN (PORCINE) 25000 UT/250ML-% IV SOLN
1200.0000 [IU]/h | INTRAVENOUS | Status: DC
  Administered 2021-01-02 – 2021-01-03 (×2): 1200 [IU]/h via INTRAVENOUS
  Filled 2021-01-02 (×2): qty 250

## 2021-01-02 MED ORDER — LIDOCAINE HCL (PF) 1 % IJ SOLN
INTRAMUSCULAR | Status: DC | PRN
  Administered 2021-01-02: 10 mL via INTRADERMAL

## 2021-01-02 MED ORDER — MIDAZOLAM HCL 2 MG/2ML IJ SOLN
INTRAMUSCULAR | Status: DC | PRN
  Administered 2021-01-02 (×2): 0.5 mg via INTRAVENOUS

## 2021-01-02 MED ORDER — FENTANYL CITRATE (PF) 100 MCG/2ML IJ SOLN
INTRAMUSCULAR | Status: AC
  Filled 2021-01-02: qty 2

## 2021-01-02 MED ORDER — NALOXONE HCL 0.4 MG/ML IJ SOLN
INTRAMUSCULAR | Status: AC
  Filled 2021-01-02: qty 1

## 2021-01-02 MED ORDER — MIDAZOLAM HCL 2 MG/2ML IJ SOLN
INTRAMUSCULAR | Status: AC
  Filled 2021-01-02: qty 2

## 2021-01-02 MED ORDER — FLUMAZENIL 0.5 MG/5ML IV SOLN
INTRAVENOUS | Status: AC
  Filled 2021-01-02: qty 5

## 2021-01-02 NOTE — Progress Notes (Addendum)
NAME:  Casey Dougal., MRN:  [id number], DOB:  October 20, 1926, LOS: 22 ADMISSION DATE:  12/17/2020, CONSULTATION DATE:  1/24 REFERRING MD:  Dr. Darrick Meigs, CHIEF COMPLAINT:  Exudative Pleural Effusion   Brief History:  87 year old male initially admitted 1/16 with HAP and associated right-sided loculated pleural effusion.  Patient completed broad-spectrum antibiotic course and despite appropriate therapy developed worsening loculated right pleural effusion and evolution of air/gas fluid level.  Right chest tube was placed and the patient completed intrapleural fibrinolytic therapy.  Past Medical History:  OSA Atrial fibrillation on chronic anticoagulation Chronic pain syndrome  Significant Hospital Events:  1/16 Admit, started on broad spectrum abx 1/17 Thoracentesis with exudative pleural effusion, 272ml hazy amber fluid removed 1/18 AFwRVR, Cardiology consulted 1/19 Completed azithromycin  1/23 Completed ceftriaxone  1/24 PCCM consulted  1/26 R chest tube with intraplural tPA / dornase instilled  1/28 Completed 3rd dose intrapleural fibrinolytic therapy  1/29 [place] O2, afebrile with normal mental status. Labile rhythm / BP on low dose neosynephrine   1/30 [place] O2, cortisol 16.1, on stress dose steroids on neo. On dig/amio for AF.   Consults:  Cardiology   Procedures:  R Chest Tube 1/25 (IR) >>   Significant Diagnostic Tests:   CT Chest 1/24 >> moderate to large loculated right pleural effusion is increased in volume when compared to 1/16.  New focus of gas with air-fluid level in the posterior compartment of the effusion.  Progressive atelectasis of the entire right lower lobe with partial atelectasis of the right middle lobe and right upper lobe.  Progressive consolidation involving the posterior medial left lower lobe containing air bronchograms.  Micro Data:  COVID 1/16 >> negative  BCx2 1/16 >> negative  Pleural fluid 1/17 >> negative  MRSA PCR 1/18 >> positive  Chest tube  aspirate 1/25 >> negative  BCx2 1/27 >>  UC 1/27 >> negative   Antimicrobials:  Azithro 1/16 >> 1/20  Ceftriaxone 1/16 >> 1/23  Cefepime 1/24 >> 1/27  Vanco 1/24 >>  Meropenem 1/29 >>   Interim History / Subjective:  No acute distress at rest.  Objective   Blood pressure 114/64, pulse 82, temperature 98.06 F (36.7 C), resp. rate 17, height 5\' 11"  (1.803 m), weight 65.1 kg, SpO2 95 %.        Intake/Output Summary (Last 24 hours) at 01/02/2021 0901 Last data filed at 01/02/2021 0106 Gross per 24 hour  Intake 1285.83 ml  Output 730 ml  Net 555.83 ml   Filed Weights   12/26/20 1112 12/29/20 0615 12/31/20 0630  Weight: 62.7 kg 65.4 kg 65.1 kg    Examination: General: Frail elderly male in no acute distress HEENT: No JVD or lymphadenopathy is appreciated Neuro: Focally intact hard of hearing CV: Heart sounds are distant  PULM: Decreased breath sounds right greater than left  GI: soft, bsx4 active positive bowelSounds Extremities: warm/dry, negative edema, chronic vascular changes Skin: no rashes or lesions   Resolved Hospital Problem list      Assessment & Plan:   Acute Hypoxemic Respiratory Failure secondary to HAP with Empyema  Wheezing  OSA - does not wear CPAP  O2 as needed Out of bed as tolerated Pulmonary toilet  Right Empyema secondary to HAP  S/p IR chest tube 1/25, fibrinolytics.   01/02/2021 for placement of apical chest tube for sequestered empyema.  Monitor chest tube output 450 cc over last 24 hours   Septic Shock  Adrenal Insufficiency  Tx to ICU 1/27.  Exam  does not match clinically, cortisol 16.1 ?if relative AI was component  Wean vasopressors as able Currently on midodrine and stress steroids and neo at 10 mcgs On antimicrobial therapy    Permanent AF Acute Systolic CHF  On eliquis, cardizem at home.  New systolic CHF (since last ECHO in 2019). Rates improved after digoxin / amio.   Cardiology assistance appreciated Currently on  amiodarone drip Digoxin Heparin drip is off for procedure TSH is 4.569 may start low dose synthroid     Chronic Pain  On PRN gabapentin, Opana at home  Low-dose opioids Monitor  Acute Metabolic Encephalopathy  Thought secondary to sepsis complicated by hearing loss. Resolved.  Currently awake alert Physical therapy Frequent orientation   Anemia  -trend CBC  -transfuse for Hgb <7% or active bleeding   Hypophosphatemia  Monitor and replete as needed, noted to be 2.8 on 01/02/2019  Moderate Protein Calorie Malnutrition  Diet as tolerated  Hyperglycemia  CBG (last 3)  Recent Labs    01/01/21 1537 01/01/21 2016 01/02/21 0732  GLUCAP 129* 136* 103*    Sliding-scale insulin protocol Best practice (evaluated daily)  Diet: D3 diet , 2/1 npo for IR chest tube Pain/Anxiety/Delirium protocol (if indicated): as above  VAP protocol (if indicated): n/a  DVT prophylaxis: Heparin gtt  GI prophylaxis: n/a  Glucose control: SSI  Mobility: As tolerated  Disposition: ICU 2/1 son updated by phone.  Goals of Care:  Last date of multidisciplinary goals of care discussion:1/29  Family and staff present: Son, MD Summary of discussion: full medical care but no CPR Follow up goals of care discussion due: 2/5  Code Status: DNR   Labs   CBC: Recent Labs  Lab 12/28/20 0919 12/28/20 1720 12/29/20 1336 12/30/20 0219 12/31/20 0229 01/02/21 0215  WBC 37.3* 23.1* 16.2* 15.5* 18.0* 10.3  NEUTROABS 32.9*  --   --   --   --   --   HGB 11.6* 11.6* 11.2* 10.9* 12.2* 11.0*  HCT 37.0* 38.0* 36.4* 35.2* 39.2 35.9*  MCV 90.2 91.1 91.0 89.8 90.7 90.0  PLT 361 334 425* 434* 461* 449*    Basic Metabolic Panel: Recent Labs  Lab 12/27/20 0548 12/28/20 0536 12/28/20 1720 12/29/20 0248 12/29/20 1336 12/30/20 0219 12/31/20 0229 01/01/21 0226 01/02/21 0215  NA 140   < > 140  --  140 139 137  --  137  K 4.2   < > 3.8  --  3.5 3.6 4.1  --  4.3  CL 104   < > 108  --  108 110 107  --   107  CO2 24   < > 27  --  24 23 21*  --  23  GLUCOSE 92   < > 116*  --  100* 91 134*  --  115*  BUN 14   < > 17  --  16 18 19   --  21  CREATININE 0.69   < > 0.91  --  0.77 0.70 0.84  --  0.70  CALCIUM 7.8*   < > 7.5*  --  7.5* 7.6* 7.7*  --  7.8*  MG 2.0  --  2.2  --   --  2.0 2.2  --   --   PHOS 3.3   < > 3.3 2.9  --  2.5 2.4* 2.9 2.8   < > = values in this interval not displayed.   GFR: Estimated Creatinine Clearance: 54.3 mL/min (by C-G formula based on SCr of 0.7  mg/dL). Recent Labs  Lab 12/28/20 0919 12/28/20 1140 12/28/20 1720 12/29/20 0248 12/29/20 1336 12/30/20 0219 12/31/20 0229 01/02/21 0215  PROCALCITON  --   --  0.13 0.12  --  0.10  --   --   WBC 37.3*  --  23.1*  --  16.2* 15.5* 18.0* 10.3  LATICACIDVEN 2.3* 1.0 1.3  --   --   --  1.3  --     Liver Function Tests: Recent Labs  Lab 12/28/20 0919 12/28/20 1720 12/30/20 0219 12/31/20 0229  AST 38 35 35 53*  ALT 31 31 27  40  ALKPHOS 84 81 68 72  BILITOT 0.4 0.7 0.4 0.2*  PROT 5.0* 4.9* 4.5* 4.8*  ALBUMIN 1.8* 1.7* 1.5* 1.7*   No results for input(s): LIPASE, AMYLASE in the last 168 hours. No results for input(s): AMMONIA in the last 168 hours.  ABG    Component Value Date/Time   PHART 7.350 12/28/2020 1640   PCO2ART 48.6 (H) 12/28/2020 1640   PO2ART 63.8 (L) 12/28/2020 1640   HCO3 26.3 12/28/2020 1640   TCO2 22 02/16/2018 0732   O2SAT 91.0 12/28/2020 1640     Coagulation Profile: Recent Labs  Lab 12/28/20 1720  INR 1.4*    Cardiac Enzymes: No results for input(s): CKTOTAL, CKMB, CKMBINDEX, TROPONINI in the last 168 hours.  HbA1C: Hgb A1c MFr Bld  Date/Time Value Ref Range Status  12/30/2020 02:19 AM 5.9 (H) 4.8 - 5.6 % Final    Comment:    (NOTE) Pre diabetes:          5.7%-6.4%  Diabetes:              >6.4%  Glycemic control for   <7.0% adults with diabetes     CBG: Recent Labs  Lab 01/01/21 0735 01/01/21 1155 01/01/21 1537 01/01/21 2016 01/02/21 0732  GLUCAP 107* 102*  129* 136* 103*     Critical care time: 20 minutes      Casey Gates Casey Gates ACNP Acute Care Nurse Practitioner [place] Please consult [facility] 01/02/2021, 9:01 AM

## 2021-01-02 NOTE — Progress Notes (Signed)
Nutrition Follow-up  DOCUMENTATION CODES:   Not applicable  INTERVENTION:  - continue Ensure Enlive BID.   NUTRITION DIAGNOSIS:   Increased nutrient needs related to acute illness as evidenced by estimated needs. -ongoing  GOAL:   Patient will meet greater than or equal to 90% of their needs -met on average  MONITOR:   PO intake,Supplement acceptance,Labs,Weight trends  ASSESSMENT:   87-year-old male with medical history of afib on anticoagulation, OSA, and chronic pain syndrome. He presented to the ED due to AMS. CXR in the ED showed RLL opacity. CT chest showed patchy peribronchovascular opacity suspicious for PNA, small to moderate-sized partly loculated R pleural effusion, and significant atelectasis of the RLL, with a smaller area of atelectasis/scarring of the R middle lobe.  Patient discussed in rounds this AM.   He has been NPO since midnight, but was previously on Dysphagia 3, thin liquids diet and was eating 50-90% at meals. He has been accepting Ensure 50-75% of the time offered.   Weight stable from 1/28-1/30 with mild pitting edema to RUE documented in the edema section of flow sheet.  Patient is laying in bed and his son is at bedside. Son reports he believes patient has been eating well and that the food has been good. Patient reports that he did not like the food at facility [facility] and would sometimes miss meals because of not liking the food. He enjoys the food here and reports that he has been eating well without any difficulties.   He is to go to IR this afternoon for additional R chest tube placement.    Labs reviewed; CBG: 103 mg/dl, Ca: 7.8 mg/dl.  Medications reviewed; 50 mg solu-cortef QID, sliding scale novolog.   Diet Order:   Diet Order            Diet NPO time specified  Diet effective midnight                 EDUCATION NEEDS:   Education needs have been addressed  Skin:  Skin Assessment: Skin Integrity Issues: Skin Integrity Issues::  Other (Comment) Other: MASD coccyx  Last BM:  1/31 (type 6)  Height:   Ht Readings from Last 1 Encounters:  12/17/20 5' 11" (1.803 m)    Weight:   Wt Readings from Last 1 Encounters:  12/31/20 65.1 kg    Estimated Nutritional Needs:  Kcal:  1500-1700 kcal Protein:  70-80 grams Fluid:  >/= 1.8 L/day      , MS, RD, LDN, CNSC Inpatient Clinical Dietitian RD pager # available in [facility]ION  After hours/weekend pager # available in AMION  

## 2021-01-02 NOTE — Progress Notes (Signed)
Referring Physician(s): Clark,L  Supervising Physician: Aletta Edouard  Patient Status:  [facility] - In-pt  Chief Complaint: Loculated right pleural effusion/empyema   Subjective: Patient resting quietly, currently in no acute distress; son in room.   Allergies: Coconut flavor [flavoring agent], Coconut oil, Doxycycline hyclate, Food, Black walnut flavor, Coconut oil, and Doxycycline  Medications: Prior to Admission medications   Medication Sig Start Date End Date Taking? Authorizing Provider  apixaban (ELIQUIS) 2.5 MG TABS tablet Take 1 tablet (2.5 mg total) by mouth 2 (two) times daily. 07/24/20  Yes Sherran Needs, NP  diltiazem (CARDIZEM SR) 120 MG 12 hr capsule Take 1 capsule (120 mg total) by mouth 2 (two) times daily. 07/24/20 08/24/20 Yes Sherran Needs, NP  gabapentin (NEURONTIN) 300 MG capsule Take 300 mg by mouth 3 (three) times daily as needed (legs-per MAR). Restless leg 05/14/20  Yes [provider]  mirtazapine (REMERON) 30 MG tablet Take 30 mg by mouth at bedtime. 11/21/20  Yes [provider]  MOVANTIK 25 MG TABS tablet Take 25 mg by mouth daily. 09/10/19  Yes [provider]  oxymorphone (OPANA) 10 MG tablet Take 10 mg by mouth every 6 (six) hours as needed for pain.  07/03/18  Yes [provider]  Jay Schlichter Oil (ARTIFICIAL TEARS) ointment Place 1 application into the left eye 3 (three) times daily. Left lower eye lid   Yes [provider]     Vital Signs: BP 111/65   Pulse 78   Temp (!) 97.34 F (36.3 C)   Resp 17   Ht 5\' 11"  (1.803 m)   Wt 143 lb 8.3 oz (65.1 kg)   SpO2 96%   BMI 20.02 kg/m   Physical Exam awake, answering simple questions okay.  Chest with diminished breath sounds right greater than left.  Heart with irregularly irregular rhythm.  Abdomen soft, positive bowel sounds, nontender.  No lower extremity edema.  Right lower chest tube in place, tube to wall suction, output 80 cc  serosanguineous fluid, no obvious air leak.  Imaging: CT CHEST W CONTRAST  Result Date: 01/01/2021 CLINICAL DATA:  Empyema. EXAM: CT CHEST WITH CONTRAST TECHNIQUE: Multidetector CT imaging of the chest was performed during intravenous contrast administration. CONTRAST:  64mL OMNIPAQUE IOHEXOL 300 MG/ML  SOLN COMPARISON:  December 25, 2020. FINDINGS: Cardiovascular: Atherosclerosis of thoracic aorta is noted without dissection. 4.1 cm ascending thoracic aortic aneurysm is noted. Coronary artery calcifications are noted. Mild cardiomegaly is noted without pericardial effusion. Mediastinum/Nodes: No enlarged mediastinal, hilar, or axillary lymph nodes. Thyroid gland, trachea, and esophagus demonstrate no significant findings. Lungs/Pleura: Interval placement of pigtail chest tube into right pleural effusion noted on prior exam. The effusion is significantly smaller, with smaller loculated component seen in the upper lobe posteriorly. Small amount of air is noted within its posterior portion. Right middle and lower lobe opacities are noted concerning for pneumonia. Mild left pleural effusion is noted with associated atelectasis or infiltrate of the left lower lobe. Upper Abdomen: No acute abnormality. Musculoskeletal: No chest wall abnormality. No acute or significant osseous findings. IMPRESSION: 1. Interval placement of pigtail chest tube into right pleural effusion noted on prior exam. The effusion is significantly smaller, with smaller loculated component seen in the upper lobe posteriorly. Small amount of air is noted within its posterior portion. 2. Right middle and lower lobe opacities are noted concerning for pneumonia. 3. Mild left pleural effusion is noted with associated atelectasis or infiltrate of the left lower lobe.  4. Coronary artery calcifications are noted suggesting coronary artery disease. 5. 4.1 cm ascending thoracic aortic aneurysm. Recommend annual imaging followup by CTA or MRA. This  recommendation follows 2010 ACCF/AHA/AATS/ACR/ASA/SCA/SCAI/SIR/STS/SVM Guidelines for the Diagnosis and Management of Patients with Thoracic Aortic Disease. Circulation. 2010; [id number]. Aortic aneurysm NOS (ICD10-I71.9). 6. Aortic atherosclerosis. Aortic Atherosclerosis (ICD10-I70.0). Electronically Signed   By: Marijo Conception M.D.   On: 01/01/2021 16:45   DG Chest Port 1 View  Result Date: 01/02/2021 CLINICAL DATA:  Empyema. EXAM: PORTABLE CHEST 1 VIEW COMPARISON:  CT 01/01/2021.  Chest x-ray 01/01/2021. FINDINGS: Right chest tube in stable position. Small right pleural effusion with a small amount of pleural air again noted. Persistent right base atelectasis. Persistent left base atelectasis/infiltrate small left pleural effusion. Cardiomegaly. No pulmonary venous congestion. IMPRESSION: 1. Right chest tube in stable position. Small right pleural effusion with a small amount of pleural air again noted. Persistent right base atelectasis. 2. Persistent left base atelectasis/infiltrate and small left pleural effusion. Electronically Signed   By: Marcello Moores  Register   On: 01/02/2021 06:52   DG CHEST PORT 1 VIEW  Result Date: 01/01/2021 CLINICAL DATA:  Empyema. EXAM: PORTABLE CHEST 1 VIEW COMPARISON:  12/30/2020 and CT chest 12/25/2020. FINDINGS: Patient is slightly rotated. Trachea is midline. Heart size stable. Small bore pigtail catheter is seen in the lower right hemithorax with a small right pleural effusion and right basilar atelectasis. No definite pneumothorax. Left lower lobe collapse/consolidation with a small to moderate left pleural effusion. IMPRESSION: 1. Small right pleural effusion with right basilar pulmonary parenchymal volume loss and chest tube in place. No definite pneumothorax. 2. Left lower lobe collapse/consolidation with a small to moderate left pleural effusion. Electronically Signed   By: Lorin Picket M.D.   On: 01/01/2021 12:33   DG Chest Port 1 View  Result Date:  12/30/2020 CLINICAL DATA:  Empyema EXAM: PORTABLE CHEST 1 VIEW COMPARISON:  Yesterday FINDINGS: Right chest tube in similar position over the lower chest. Hazy opacity of the right upper of the lower chest from reported empyema. Dense retrocardiac opacity with atelectatic appearance on most recent CT. No visible pneumothorax. No pulmonary edema. IMPRESSION: Continued right empyema and extensive atelectasis. Electronically Signed   By: Monte Fantasia M.D.   On: 12/30/2020 04:56   ECHOCARDIOGRAM COMPLETE  Result Date: 12/30/2020    ECHOCARDIOGRAM REPORT   Patient Name:   Tayten Chubb. Date of Exam: 12/30/2020 Medical Rec #:  [id number]          Height:       71.0 in Accession #:    [id number]         Weight:       144.2 lb Date of Birth:  09-02-1926         BSA:          1.835 m Patient Age:    37 years           BP:           81/59 mmHg Patient Gender: M                  HR:           120 bpm. Exam Location:  Inpatient Procedure: 2D Echo, Cardiac Doppler and Color Doppler Indications:    I48.1 Persistent atrial fibrillation  History:        Patient has prior history of Echocardiogram examinations, most  recent 12/31/2017. Risk Factors:Sleep Apnea. Cancer.  Sonographer:    Jonelle Sidle Dance Referring Phys: [place]  1. Left ventricular ejection fraction, by estimation, is 40 to 45%. Difficult to determine systolic function as poor image quality and patient is in Afib with RVR, but appears left ventricle has mildly decreased function. Would consider repeat limited echo with contrast once heart rate better controlled. Left ventricular endocardial border not optimally defined to evaluate regional wall motion. There is mild left ventricular hypertrophy. Left ventricular diastolic parameters are indeterminate.  2. Right ventricular systolic function is mildly reduced. The right ventricular size is mildly enlarged. There is normal pulmonary artery systolic pressure. The estimated  right ventricular systolic pressure is 40.3 mmHg.  3. Left atrial size was moderately dilated.  4. The mitral valve is degenerative. No evidence of mitral valve regurgitation. Moderate mitral annular calcification.  5. The aortic valve was not well visualized. Aortic valve regurgitation is trivial. No aortic stenosis is present.  6. Aortic dilatation noted. There is mild dilatation of the aortic root, measuring 42 mm. There is mild dilatation of the ascending aorta, measuring 41 mm.  7. The inferior vena cava is dilated in size with <50% respiratory variability, suggesting right atrial pressure of 15 mmHg. FINDINGS  Left Ventricle: Left ventricular ejection fraction, by estimation, is 40 to 45%. The left ventricle has mildly decreased function. Left ventricular endocardial border not optimally defined to evaluate regional wall motion. The left ventricular internal cavity size was small. There is mild left ventricular hypertrophy. Left ventricular diastolic parameters are indeterminate. Right Ventricle: The right ventricular size is mildly enlarged. Right vetricular wall thickness was not well visualized. Right ventricular systolic function is mildly reduced. There is normal pulmonary artery systolic pressure. The tricuspid regurgitant velocity is 2.08 m/s, and with an assumed right atrial pressure of 15 mmHg, the estimated right ventricular systolic pressure is 47.4 mmHg. Left Atrium: Left atrial size was moderately dilated. Right Atrium: Right atrial size was normal in size. Pericardium: There is no evidence of pericardial effusion. Mitral Valve: The mitral valve is degenerative in appearance. Moderate mitral annular calcification. No evidence of mitral valve regurgitation. Tricuspid Valve: The tricuspid valve is normal in structure. Tricuspid valve regurgitation is trivial. Aortic Valve: The aortic valve was not well visualized. Aortic valve regurgitation is trivial. Aortic regurgitation PHT measures 352 msec. No  aortic stenosis is present. Pulmonic Valve: The pulmonic valve was not well visualized. Pulmonic valve regurgitation is not visualized. Aorta: Aortic dilatation noted. There is mild dilatation of the aortic root, measuring 42 mm. There is mild dilatation of the ascending aorta, measuring 41 mm. Venous: The inferior vena cava is dilated in size with less than 50% respiratory variability, suggesting right atrial pressure of 15 mmHg. IAS/Shunts: The interatrial septum was not well visualized.  LEFT VENTRICLE PLAX 2D LVIDd:         3.70 cm LVIDs:         2.70 cm LV PW:         1.30 cm LV IVS:        1.10 cm LVOT diam:     2.20 cm LV SV:         40 LV SV Index:   22 LVOT Area:     3.80 cm  RIGHT VENTRICLE          IVC RV Basal diam:  3.70 cm  IVC diam: 2.20 cm RV Mid diam:    2.90 cm TAPSE (M-mode): 1.8 cm  LEFT ATRIUM           Index       RIGHT ATRIUM           Index LA diam:      4.90 cm 2.67 cm/m  RA Area:     17.80 cm LA Vol (A2C): 81.3 ml 44.31 ml/m RA Volume:   45.60 ml  24.85 ml/m LA Vol (A4C): 79.7 ml 43.44 ml/m  AORTIC VALVE LVOT Vmax:   67.30 cm/s LVOT Vmean:  49.000 cm/s LVOT VTI:    0.105 m AI PHT:      352 msec  AORTA Ao Root diam: 4.20 cm Ao Asc diam:  4.50 cm MITRAL VALVE               TRICUSPID VALVE MV Area (PHT): 3.08 cm    TR Peak grad:   17.3 mmHg MV Decel Time: 246 msec    TR Vmax:        208.00 cm/s MV E velocity: 59.50 cm/s MV A velocity: 52.80 cm/s  SHUNTS MV E/A ratio:  1.13        Systemic VTI:  0.10 m                            Systemic Diam: 2.20 cm Oswaldo Milian MD Electronically signed by Oswaldo Milian MD Signature Date/Time: 12/30/2020/2:26:21 PM    Final     Labs:  CBC: Recent Labs    12/29/20 1336 12/30/20 0219 12/31/20 0229 01/02/21 0215  WBC 16.2* 15.5* 18.0* 10.3  HGB 11.2* 10.9* 12.2* 11.0*  HCT 36.4* 35.2* 39.2 35.9*  PLT 425* 434* 461* 449*    COAGS: Recent Labs    09/01/20 0824 12/28/20 1720 12/30/20 1401  INR 1.2 1.4*  --   APTT  --    --  33    BMP: Recent Labs    06/26/20 0005 07/13/20 0944 07/27/20 1230 09/01/20 0824 10/13/20 0831 12/29/20 1336 12/30/20 0219 12/31/20 0229 01/02/21 0215  NA 141 142 139 143   < > 140 139 137 137  K 4.5 4.2 4.4 4.0   < > 3.5 3.6 4.1 4.3  CL 104 108 102 106   < > 108 110 107 107  CO2 28 28 27 29    < > 24 23 21* 23  GLUCOSE 112* 58* 102* 88   < > 100* 91 134* 115*  BUN 21 19 22  24*   < > 16 18 19 21   CALCIUM 8.9 9.3 9.2 9.4   < > 7.5* 7.6* 7.7* 7.8*  CREATININE 0.97 1.05 1.08 0.98   < > 0.77 0.70 0.84 0.70  GFRNONAA >60 >60 60* >60   < > >60 >60 >60 >60  GFRAA >60 >60 >60 >60  --   --   --   --   --    < > = values in this interval not displayed.    LIVER FUNCTION TESTS: Recent Labs    12/28/20 0919 12/28/20 1720 12/30/20 0219 12/31/20 0229  BILITOT 0.4 0.7 0.4 0.2*  AST 38 35 35 53*  ALT 31 31 27  40  ALKPHOS 84 81 68 72  PROT 5.0* 4.9* 4.5* 4.8*  ALBUMIN 1.8* 1.7* 1.5* 1.7*    Assessment and Plan: Patient with history of atrial fibrillation, respiratory failure secondary to pneumonia/right chest empyema/loculated pleural effusion; status post right basilar chest drain placement on 12/26/2020; follow-up CT chest yesterday revealed:  1.  Interval placement of pigtail chest tube into right pleural effusion noted on prior exam. The effusion is significantly smaller, with smaller loculated component seen in the upper lobe posteriorly. Small amount of air is noted within its posterior portion. 2. Right middle and lower lobe opacities are noted concerning for pneumonia. 3. Mild left pleural effusion is noted with associated atelectasis or infiltrate of the left lower lobe. 4. Coronary artery calcifications are noted suggesting coronary artery disease. 5. 4.1 cm ascending thoracic aortic aneurysm. Recommend annual imaging followup by CTA or MRA. This recommendation follows 2010 ACCF/AHA/AATS/ACR/ASA/SCA/SCAI/SIR/STS/SVM Guidelines for the Diagnosis and Management of  Patients with Thoracic Aortic Disease. Circulation. 2010; [id number]. Aortic aneurysm NOS (ICD10-I71.9). 6. Aortic atherosclerosis.  Patient afebrile, WBC normal, hemoglobin 11, platelets 449k, creatinine normal, pleural fluid cultures negative latest blood cultures negative to date, COVID 19 neg; BP 111/65, patient remains on pressors; has received TPA/DNase via right chest tube by CCM; request now received from CCM for additional right chest drain placement into small loculated component of upper lobe; imaging studies were reviewed by Dr. Kathlene Cote and case discussed with Dr. Carlis Abbott.Risks and benefits discussed with the patient/son including bleeding, infection, damage to adjacent structures,  and sepsis.  All of the patient's questions were answered, patient is agreeable to proceed. Consent signed and in chart.  Procedure scheduled for later today  Electronically Signed: D. Rowe Robert, PA-C 01/02/2021, 11:26 AM   I spent a total of 25 minutes at the the patient's bedside AND on the patient's hospital floor or unit, greater than 50% of which was counseling/coordinating care for right chest drain(s) placement    Patient ID: [name]., male   DOB: May 27, 1926, 87 y.o.   MRN: [id number]

## 2021-01-02 NOTE — Progress Notes (Signed)
[facility] for heparin Indication: atrial fibrillation  Allergies  Allergen Reactions  . Coconut Flavor [Flavoring Agent] Hypertension    Anything that is related to coconut---LOSS OF CONSCIOUSNESS (SYNCOPE)  . Coconut Oil Other (See Comments)    Anything that is related to coconut--- LOSS OF CONSCIOUSNESS (SYNCOPE)  . Doxycycline Hyclate Hives  . Food Anaphylaxis    ALLERGIC TO ALL NUTS WITH THE EXCEPTION OF CASHEWS, ALMONDS  . Black [employer]  Other (See Comments)    Passes out ( can only eat almonds or peanuts)  . Coconut Oil Other (See Comments)    Faints   . Doxycycline Rash    Patient Measurements: Height: 5\' 11"  (180.3 cm) Weight: 65.1 kg (143 lb 8.3 oz) IBW/kg (Calculated) : 75.3 Heparin Dosing Weight: 65.4 kg  Vital Signs: Temp: 97.34 F (36.3 C) (02/01 0900) Temp Source: Bladder (02/01 0800) BP: 111/65 (02/01 0900) Pulse Rate: 78 (02/01 0900)  Labs: Recent Labs    12/30/20 1401 12/30/20 2237 12/31/20 0229 12/31/20 0830 12/31/20 1642 01/01/21 0226 01/02/21 0215  HGB  --   --  12.2*  --   --   --  11.0*  HCT  --   --  39.2  --   --   --  35.9*  PLT  --   --  461*  --   --   --  449*  APTT 33  --   --   --   --   --   --   HEPARINUNFRC 0.19*   < >  --  0.43 0.33 0.46 0.47  CREATININE  --   --  0.84  --   --   --  0.70  TROPONINIHS  --   --   --  9  --   --   --    < > = values in this interval not displayed.    Estimated Creatinine Clearance: 54.3 mL/min (by C-G formula based on SCr of 0.7 mg/dL).   Assessment: Pharmacy consulted to dose heparin for this patient who had previously been on apixaban for atrial fibrillation.  Last dose of apixaban was 12/25/2020 at 1122 and was held for thoracentesis and possible other procedures.  Age > 38 years and history of anemia, prostate cancer, GI bleeding, and blood transfusion. Baseline heparin level 0.19 and aPTT 33 which correlate.  01/02/2021 Heparin level 0.47,  therapeutic on 1200 units/hr CBC stable No bleeding or line issues per RN Heparin is off now for IR to place CT  Goal of Therapy:  Heparin level 0.3-0.7 units/ml aPTT 66-102 seconds Monitor platelets by anticoagulation protocol: Yes   Plan:  Heparin is currently off pending IR placement of chest tube Monitor daily heparin level & daily CBC F/u to resume heparin after CT placement  Eudelia Bunch, Pharm.D 01/02/2021 10:23 AM Clinical Pharmacist [place] Please utilize Amion for appropriate phone number to reach the unit pharmacist ([place]) 01/02/2021 10:23 AM

## 2021-01-02 NOTE — Procedures (Signed)
Interventional Radiology Procedure Note  Procedure: Right chest tube placement under CT guidance  Complications: None  Estimated Blood Loss: None  Findings: With patient in decubitus position with right side up, right lateral superior loculated pleural fluid targeted. During needle advancement, fluid dissipated without aspirating any fluid and patient sustained a small lateral pneumothorax. 12 Fr pigtail tube placed into lateral PTX and connected to Pleur-evac. No return of fluid from tube, so unable to collect another fluid specimen. Will have [place]ley connected to wall suction in room.  Venetia Night. Kathlene Cote, M.D Pager:  [phone]

## 2021-01-02 NOTE — Progress Notes (Signed)
Progress Note  Patient Name: Casey Gates. Date of Encounter: 01/02/2021  [facility] Cardiologist: Larae Grooms, MD   Subjective   Anticipating another chest tube placement this morning. Reports breathing is stable. No complaints of chest pain or SOB.   Inpatient Medications    Scheduled Meds: . artificial tears  1 application Left Eye TID  . budesonide (PULMICORT) nebulizer solution  0.5 mg Nebulization BID  . chlorhexidine  15 mL Mouth Rinse BID  . Chlorhexidine Gluconate Cloth  6 each Topical Daily  . digoxin  0.0625 mg Oral Daily  . feeding supplement  237 mL Oral BID BM  . hydrocortisone sod succinate (SOLU-CORTEF) inj  50 mg Intravenous Q6H  . insulin aspart  0-15 Units Subcutaneous TID WC  . linezolid  600 mg Oral Q12H  . mouth rinse  15 mL Mouth Rinse q12n4p  . midodrine  5 mg Oral Q8H  . sodium chloride flush  10 mL Intracatheter Q8H   Continuous Infusions: . sodium chloride 10 mL/hr at 01/02/21 0106  . amiodarone 30 mg/hr (01/02/21 0656)  . heparin 1,200 Units/hr (01/01/21 1039)  . meropenem (MERREM) IV 1 g (01/02/21 0617)  . phenylephrine (NEO-SYNEPHRINE) Adult infusion 20 mcg/min (01/02/21 0156)   PRN Meds: fentaNYL (SUBLIMAZE) injection, ipratropium-albuterol, lip balm, metoprolol tartrate, ondansetron **OR** ondansetron (ZOFRAN) IV, oxyCODONE, Resource ThickenUp Clear   Vital Signs    Vitals:   01/01/21 2300 01/01/21 2330 01/02/21 0000 01/02/21 0100  BP: 128/64 123/72 121/71 114/64  Pulse: 83 79 88 82  Resp: 16 17 17 17   Temp: 98.1 F (36.7 C) 98.1 F (36.7 C) 98.06 F (36.7 C) 98.06 F (36.7 C)  TempSrc:      SpO2: 97% 97% 97% 95%  Weight:      Height:        Intake/Output Summary (Last 24 hours) at 01/02/2021 0820 Last data filed at 01/02/2021 0106 Gross per 24 hour  Intake 1285.83 ml  Output 730 ml  Net 555.83 ml   Last 3 Weights 12/31/2020 12/29/2020 12/26/2020  Weight (lbs) 143 lb 8.3 oz 144 lb 2.9 oz 138 lb 3.7 oz  Weight  (kg) 65.1 kg 65.4 kg 62.7 kg      Telemetry    Atrial fibrillation with rates in the 70s-90s - Personally Reviewed  ECG    No new tracings - Personally Reviewed  Physical Exam   GEN: Elderly gentleman sitting upright in bed in no acute distress.   Neck: No JVD Cardiac: IRIR, no murmurs, rubs, or gallops.  Respiratory: Clear to auscultation bilaterally. GI: Soft, nontender, non-distended  MS: No edema; No deformity. Neuro:  Nonfocal  Psych: Normal affect   Labs    High Sensitivity Troponin:   Recent Labs  Lab 12/28/20 1720 12/31/20 0830  TROPONINIHS 14 9      Chemistry Recent Labs  Lab 12/28/20 1720 12/29/20 1336 12/30/20 0219 12/31/20 0229 01/02/21 0215  NA 140   < > 139 137 137  K 3.8   < > 3.6 4.1 4.3  CL 108   < > 110 107 107  CO2 27   < > 23 21* 23  GLUCOSE 116*   < > 91 134* 115*  BUN 17   < > 18 19 21   CREATININE 0.91   < > 0.70 0.84 0.70  CALCIUM 7.5*   < > 7.6* 7.7* 7.8*  PROT 4.9*  --  4.5* 4.8*  --   ALBUMIN 1.7*  --  1.5*  1.7*  --   AST 35  --  35 53*  --   ALT 31  --  27 40  --   ALKPHOS 81  --  68 72  --   BILITOT 0.7  --  0.4 0.2*  --   GFRNONAA >60   < > >60 >60 >60  ANIONGAP 5   < > 6 9 7    < > = values in this interval not displayed.     Hematology Recent Labs  Lab 12/30/20 0219 12/31/20 0229 01/02/21 0215  WBC 15.5* 18.0* 10.3  RBC 3.92* 4.32 3.99*  HGB 10.9* 12.2* 11.0*  HCT 35.2* 39.2 35.9*  MCV 89.8 90.7 90.0  MCH 27.8 28.2 27.6  MCHC 31.0 31.1 30.6  RDW 14.5 14.1 14.0  PLT 434* 461* 449*    BNPNo results for input(s): BNP, PROBNP in the last 168 hours.   DDimer No results for input(s): DDIMER in the last 168 hours.   Radiology    CT CHEST W CONTRAST  Result Date: 01/01/2021 CLINICAL DATA:  Empyema. EXAM: CT CHEST WITH CONTRAST TECHNIQUE: Multidetector CT imaging of the chest was performed during intravenous contrast administration. CONTRAST:  70mL OMNIPAQUE IOHEXOL 300 MG/ML  SOLN COMPARISON:  December 25, 2020.  FINDINGS: Cardiovascular: Atherosclerosis of thoracic aorta is noted without dissection. 4.1 cm ascending thoracic aortic aneurysm is noted. Coronary artery calcifications are noted. Mild cardiomegaly is noted without pericardial effusion. Mediastinum/Nodes: No enlarged mediastinal, hilar, or axillary lymph nodes. Thyroid gland, trachea, and esophagus demonstrate no significant findings. Lungs/Pleura: Interval placement of pigtail chest tube into right pleural effusion noted on prior exam. The effusion is significantly smaller, with smaller loculated component seen in the upper lobe posteriorly. Small amount of air is noted within its posterior portion. Right middle and lower lobe opacities are noted concerning for pneumonia. Mild left pleural effusion is noted with associated atelectasis or infiltrate of the left lower lobe. Upper Abdomen: No acute abnormality. Musculoskeletal: No chest wall abnormality. No acute or significant osseous findings. IMPRESSION: 1. Interval placement of pigtail chest tube into right pleural effusion noted on prior exam. The effusion is significantly smaller, with smaller loculated component seen in the upper lobe posteriorly. Small amount of air is noted within its posterior portion. 2. Right middle and lower lobe opacities are noted concerning for pneumonia. 3. Mild left pleural effusion is noted with associated atelectasis or infiltrate of the left lower lobe. 4. Coronary artery calcifications are noted suggesting coronary artery disease. 5. 4.1 cm ascending thoracic aortic aneurysm. Recommend annual imaging followup by CTA or MRA. This recommendation follows 2010 ACCF/AHA/AATS/ACR/ASA/SCA/SCAI/SIR/STS/SVM Guidelines for the Diagnosis and Management of Patients with Thoracic Aortic Disease. Circulation. 2010; [id number]. Aortic aneurysm NOS (ICD10-I71.9). 6. Aortic atherosclerosis. Aortic Atherosclerosis (ICD10-I70.0). Electronically Signed   By: Marijo Conception M.D.   On:  01/01/2021 16:45   DG Chest Port 1 View  Result Date: 01/02/2021 CLINICAL DATA:  Empyema. EXAM: PORTABLE CHEST 1 VIEW COMPARISON:  CT 01/01/2021.  Chest x-ray 01/01/2021. FINDINGS: Right chest tube in stable position. Small right pleural effusion with a small amount of pleural air again noted. Persistent right base atelectasis. Persistent left base atelectasis/infiltrate small left pleural effusion. Cardiomegaly. No pulmonary venous congestion. IMPRESSION: 1. Right chest tube in stable position. Small right pleural effusion with a small amount of pleural air again noted. Persistent right base atelectasis. 2. Persistent left base atelectasis/infiltrate and small left pleural effusion. Electronically Signed   By: Marcello Moores  Register  On: 01/02/2021 06:52   DG CHEST PORT 1 VIEW  Result Date: 01/01/2021 CLINICAL DATA:  Empyema. EXAM: PORTABLE CHEST 1 VIEW COMPARISON:  12/30/2020 and CT chest 12/25/2020. FINDINGS: Patient is slightly rotated. Trachea is midline. Heart size stable. Small bore pigtail catheter is seen in the lower right hemithorax with a small right pleural effusion and right basilar atelectasis. No definite pneumothorax. Left lower lobe collapse/consolidation with a small to moderate left pleural effusion. IMPRESSION: 1. Small right pleural effusion with right basilar pulmonary parenchymal volume loss and chest tube in place. No definite pneumothorax. 2. Left lower lobe collapse/consolidation with a small to moderate left pleural effusion. Electronically Signed   By: Lorin Picket M.D.   On: 01/01/2021 12:33    Cardiac Studies   2D echoJan2019 - Left ventricle: The cavity size was normal. Systolic function was  vigorous. The estimated ejection fraction was in the range of 65%  to 70%. Images were inadequate for LV wall motion assessment.  Left ventricular diastolic function parameters were normal.  - Aortic valve: Poorly visualized. Trileaflet; normal thickness,  mildly  calcified leaflets. There was trivial regurgitation.  - Mitral valve: Calcified annulus.  - Left atrium: The atrium was mildly dilated.  - Pulmonary arteries: Systolic pressure could not be accurately  estimated.  2D echoJan [id number] 1. Left ventricular ejection fraction, by estimation, is 40 to 45%.  Difficult to determine systolic function as poor image quality and patient  is in Afib with RVR, but appears left ventricle has mildly decreased  function. Would consider repeat limited  echo with contrast once heart rate better controlled. Left ventricular  endocardial border not optimally defined to evaluate regional wall motion.  There is mild left ventricular hypertrophy. Left ventricular diastolic  parameters are indeterminate.  2. Right ventricular systolic function is mildly reduced. The right  ventricular size is mildly enlarged. There is normal pulmonary artery  systolic pressure. The estimated right ventricular systolic pressure is  35.3 mmHg.  3. Left atrial size was moderately dilated.  4. The mitral valve is degenerative. No evidence of mitral valve  regurgitation. Moderate mitral annular calcification.  5. The aortic valve was not well visualized. Aortic valve regurgitation  is trivial. No aortic stenosis is present.  6. Aortic dilatation noted. There is mild dilatation of the aortic root,  measuring 42 mm. There is mild dilatation of the ascending aorta,  measuring 41 mm.  7. The inferior vena cava is dilated in size with <50% respiratory  variability, suggesting right atrial pressure of 15 mmHg.   Patient Profile     87 [place], coronary calcifications without clinical CAD, untreated OSA, anemia, prostate Ca admitted with RLL pneumonia/empyema, complicated by septic shock and Afib w RVR, foiund to have newly decreased LVEF when compared to echo from 2019 (comparison echo predates onset of AFib).  Assessment &  Plan    1. Atrial fibrillation with RVR: patient has known history of permanent atrial fibrillation with episodes of RVR this admission int he setting of sepsis. BP has been soft limiting rate control. He was started on IV amiodarone and low dose digoxin with plans to transition to BBlocker once BP improved. HR improved overnight from 110s yesterday to 90s today. Currently on a heparin gtt until clear no procedures/interventions necessary.  - Continue amiodarone gtt and digoxin for rate control - anticipate transition to BBlocker prior to discharge - Will check digoxin level tomorrow morning - Continue heparin gtt for stroke ppx -  Anticipate transition to home eliquis at discharge  2. Acute combined CHF: echo this admission showed EF 40-45%, down from 65-70% in 2019. Unclear acuity of his decline in EF - he denies anginal complaints, no evidence of prior MI, and trops normal despite tachycardia and hypotension in the past 24 hours. He has evidence of "dense" coronary artery calcifications on CT this admission - likely he has some degree of CAD. Also possible this is tachycardia induced given HR frequently in the 100s since afib onset. Hypotension has limited GDMT. He appears euvolemic on exam.  - No plans for an ischemic evaluation at this time - Anticipate starting BBlocker +/- ARB as BP improves with management of his sepsis.  - Plan to repeat echo in 2-3 months following improvement in HR control   3. Hypotension in the setting of sepsis: patient presented with AMS, found to have HCAP on admission with associated right -sided loculated pleural effusion. Initially started on broad spectrum antibiotics, however imaging suggested worsening pleural effusion prompting CT guided chest tube placement 12/27/20. Repeat imaging yesterday suggested ongoing upper lobe empyema with plans for additional thoracostomy tube placement today by IR. He remains on pressors, though was started on midodrine yesterday.  -  Continue management per primary team  4. Coronary artery calcifications: noted to have "dense" calcifications on CT this admission. Given advanced age this is not terribly surprising. He remains asymptomatic. Fairly limited functional status at baseline. Not on aspirin given need for anticoagulation. LDL 72 on labs this admission; doubtful he will gain any meaningful benefit from addition of statin therapy. - No plans for ischemic evaluation at this time.   5. RBBB and LAFB: at risk for advanced AV block with rate control/antiarrhythmic meds - Continue to monitor on telemetry      For questions or updates, please contact [place] Please consult [email or web address] for contact info under        Signed, Abigail Butts, PA-C  01/02/2021, 8:20 AM

## 2021-01-02 NOTE — Progress Notes (Signed)
Heparin gtt turned off in prep for procedure

## 2021-01-03 ENCOUNTER — Inpatient Hospital Stay (HOSPITAL_COMMUNITY): Payer: Medicare PPO

## 2021-01-03 DIAGNOSIS — I4891 Unspecified atrial fibrillation: Secondary | ICD-10-CM | POA: Diagnosis not present

## 2021-01-03 DIAGNOSIS — A419 Sepsis, unspecified organism: Secondary | ICD-10-CM | POA: Diagnosis not present

## 2021-01-03 DIAGNOSIS — J9601 Acute respiratory failure with hypoxia: Secondary | ICD-10-CM

## 2021-01-03 DIAGNOSIS — J189 Pneumonia, unspecified organism: Secondary | ICD-10-CM | POA: Diagnosis not present

## 2021-01-03 DIAGNOSIS — J869 Pyothorax without fistula: Secondary | ICD-10-CM | POA: Diagnosis not present

## 2021-01-03 DIAGNOSIS — R6521 Severe sepsis with septic shock: Secondary | ICD-10-CM | POA: Diagnosis not present

## 2021-01-03 LAB — CULTURE, BLOOD (ROUTINE X 2)
Culture: NO GROWTH
Culture: NO GROWTH
Special Requests: ADEQUATE
Special Requests: ADEQUATE

## 2021-01-03 LAB — CBC
HCT: 41.4 % (ref 39.0–52.0)
Hemoglobin: 13 g/dL (ref 13.0–17.0)
MCH: 27.8 pg (ref 26.0–34.0)
MCHC: 31.4 g/dL (ref 30.0–36.0)
MCV: 88.7 fL (ref 80.0–100.0)
Platelets: 390 10*3/uL (ref 150–400)
RBC: 4.67 MIL/uL (ref 4.22–5.81)
RDW: 14.1 % (ref 11.5–15.5)
WBC: 10.5 10*3/uL (ref 4.0–10.5)
nRBC: 0 % (ref 0.0–0.2)

## 2021-01-03 LAB — PHOSPHORUS: Phosphorus: 2.9 mg/dL (ref 2.5–4.6)

## 2021-01-03 LAB — BASIC METABOLIC PANEL
Anion gap: 7 (ref 5–15)
BUN: 22 mg/dL (ref 8–23)
CO2: 25 mmol/L (ref 22–32)
Calcium: 8 mg/dL — ABNORMAL LOW (ref 8.9–10.3)
Chloride: 105 mmol/L (ref 98–111)
Creatinine, Ser: 0.82 mg/dL (ref 0.61–1.24)
GFR, Estimated: >60 mL/min (ref >60)
Glucose, Bld: 109 mg/dL — ABNORMAL HIGH (ref 70–99)
Potassium: 4.6 mmol/L (ref 3.5–5.1)
Sodium: 137 mmol/L (ref 135–145)

## 2021-01-03 LAB — GLUCOSE, CAPILLARY
Glucose-Capillary: 107 mg/dL — ABNORMAL HIGH (ref 70–99)
Glucose-Capillary: 101 mg/dL — ABNORMAL HIGH (ref 70–99)
Glucose-Capillary: 118 mg/dL — ABNORMAL HIGH (ref 70–99)
Glucose-Capillary: 153 mg/dL — ABNORMAL HIGH (ref 70–99)

## 2021-01-03 LAB — HEPARIN LEVEL (UNFRACTIONATED): Heparin Unfractionated: 0.61 IU/mL (ref 0.30–0.70)

## 2021-01-03 LAB — DIGOXIN LEVEL: Digoxin Level: 0.3 ng/mL — ABNORMAL LOW (ref 0.8–2.0)

## 2021-01-03 LAB — MAGNESIUM: Magnesium: 2.1 mg/dL (ref 1.7–2.4)

## 2021-01-03 NOTE — Progress Notes (Signed)
Pharmacy Antibiotic Note  Casey Gates. is a 87 y.o. male admitted on 12/17/2020 with pneumonia.  Pharmacy has been consulted for meropenem & zyvox dosing. 01/03/2021 D18 total abx, D10 vanc/zyvox, D7 meropenem WBC WNL, AF, SCr WNL 1/25 R lateral chest tube 2/1 anterior chest tube  Chest tube drainage - Apical 195ml, lateral 130 ml in chamber on exam Off pressors, on RA All cultures negative, Final  Plan: Continue Meropenem 1 gm IV q8h Continue zyvox 600 mg po BID   Height: 5\' 11"  (180.3 cm) Weight: 65.1 kg (143 lb 8.3 oz) IBW/kg (Calculated) : 75.3  Temp (24hrs), Avg:97.6 F (36.4 C), Min:97 F (36.1 C), Max:97.8 F (36.6 C)  Recent Labs  Lab 12/28/20 0919 12/28/20 1140 12/28/20 1720 12/29/20 1336 12/30/20 0219 12/30/20 2237 12/31/20 0229 12/31/20 1630 01/02/21 0215 01/03/21 0252  WBC 37.3*  --  23.1* 16.2* 15.5*  --  18.0*  --  10.3 10.5  CREATININE 0.88  --  0.91 0.77 0.70  --  0.84  --  0.70 0.82  LATICACIDVEN 2.3* 1.0 1.3  --   --   --  1.3  --   --   --   VANCOTROUGH  --   --   --   --   --   --   --  13*  --   --   VANCOPEAK  --   --   --   --   --  22*  --   --   --   --     Estimated Creatinine Clearance: 52.9 mL/min (by C-G formula based on SCr of 0.82 mg/dL).    Allergies  Allergen Reactions  . Coconut Flavor [Flavoring Agent] Hypertension    Anything that is related to coconut---LOSS OF CONSCIOUSNESS (SYNCOPE)  . Coconut Oil Other (See Comments)    Anything that is related to coconut--- LOSS OF CONSCIOUSNESS (SYNCOPE)  . Doxycycline Hyclate Hives  . Food Anaphylaxis    ALLERGIC TO ALL NUTS WITH THE EXCEPTION OF CASHEWS, ALMONDS  . Black [employer]  Other (See Comments)    Passes out ( can only eat almonds or peanuts)  . Coconut Oil Other (See Comments)    Faints   . Doxycycline Rash   Antimicrobials this admission:  1/16 CTX >> 1/11 1/16 azith> 1/23 1/24 cefepime >>1/27 1/24 vancomycin >>1/31 1/31 zyvox >> 1/27 meropenem >>  Dose  adjustments this admission:  1/29 Vpk= 22 , 1/30 VT = 13 - per RN, level collected before dose was given (AUC 424) --> cont vanc 1gm q24h Microbiology results:  1/16 BCx: NGF 1/17 Pleural fluid: ngF 1/18 MRSA PCR + 1/25: chest tube abscess: NGF  1/27 BCx4: ngF 1/27 UCx: NGF  Thank you for allowing pharmacy to be a part of this patient's care.  Eudelia Bunch, Pharm.D 01/03/2021 10:21 AM

## 2021-01-03 NOTE — Progress Notes (Signed)
Physical Therapy Treatment Patient Details Name: Casey Gates. MRN: [id number] DOB: [birth year] Today's Date: 01/03/2021    History of Present Illness Patient is a 87 year old male PMH of A. fib, CKD, chronic pain, OSA (does not use BiPAP) who presented to the hospital 12/17/2020 by son for altered mental status. Per chart had COVID end of Dec 2021    PT Comments    Pt states he feels better but does feel like attempting OOB to chair today. incr sitting tolerance, some initial dizziness with continued low BPs. BP sitting 84/56, dizziness subsided after 1 minute. Other VS stable with activity. Continue PT POC  Follow Up Recommendations  SNF     Equipment Recommendations  Wheelchair cushion (measurements PT);Wheelchair (measurements PT)    Recommendations for Other Services       Precautions / Restrictions Precautions Precautions: Fall Precaution Comments: 2 CTs, multiple lines    Mobility  Bed Mobility Overal bed mobility: Needs Assistance Bed Mobility: Supine to Sit;Sit to Supine     Supine to sit: Min assist;+2 for safety/equipment Sit to supine: Min assist;Mod assist;+2 for safety/equipment   General bed mobility comments: partial roll to pt right, use of bed rail with hand over hand. assist to elevate trunk completely and to bring LEs back on to bed. incr time needed  Transfers                 General transfer comment: deferred d/t decr BP  Ambulation/Gait                 Stairs             Wheelchair Mobility    Modified Rankin (Stroke Patients Only)       Balance   Sitting-balance support: Feet supported;Single extremity supported Sitting balance-Leahy Scale: Fair Sitting balance - Comments: sat EOB ~ 14 minutes, multi-modal cues for trunk extension, shoulder depression. cues for hip extension to keep feet in contact with floor                                    Cognition Arousal/Alertness: Awake/alert Behavior  During Therapy: [facility] for tasks assessed/performed Overall Cognitive Status: Within Functional Limits for tasks assessed                                        Exercises General Exercises - Lower Extremity Ankle Circles/Pumps: AROM;Both;Seated    General Comments        Pertinent Vitals/Pain Pain Assessment: Faces Faces Pain Scale: Hurts little more Pain Location: Rt side/back (new CT site) Pain Descriptors / Indicators: Sore Pain Intervention(s): Limited activity within patient's tolerance;Monitored during session;Repositioned    Home Living                      Prior Function            PT Goals (current goals can now be found in the care plan section) Acute Rehab PT Goals Patient Stated Goal: To walk and return to apartment Progress towards PT goals: Progressing toward goals    Frequency    Min 2X/week      PT Plan Current plan remains appropriate    Co-evaluation              AM-PAC PT "6 Clicks"  Mobility   Outcome Measure  Help needed turning from your back to your side while in a flat bed without using bedrails?: A Little Help needed moving from lying on your back to sitting on the side of a flat bed without using bedrails?: A Little Help needed moving to and from a bed to a chair (including a wheelchair)?: Total Help needed standing up from a chair using your arms (e.g., wheelchair or bedside chair)?: Total Help needed to walk in hospital room?: Total Help needed climbing 3-5 steps with a railing? : Total 6 Click Score: 10    End of Session   Activity Tolerance: Patient limited by fatigue Patient left: in bed;with call bell/phone within reach;with bed alarm set Nurse Communication: Other (comment) (incontinent of stool, NT aware) PT Visit Diagnosis: Muscle weakness (generalized) (M62.81);Unsteadiness on feet (R26.81)     Time: 7903-8333 PT Time Calculation (min) (ACUTE ONLY): 23 min  Charges:  $Therapeutic Activity:  23-37 mins                     Baxter Flattery, PT  Acute Rehab Dept ([place]) [phone] Pager [phone]  01/03/2021    [facility] 01/03/2021, 2:57 PM

## 2021-01-03 NOTE — Progress Notes (Signed)
Pharmacy Antibiotic Note  Ricardo Schubach. is a 87 y.o. male admitted on 12/17/2020 with pneumonia.  Pharmacy has been consulted for meropenem & zyvox dosing. 01/03/2021 D18 total abx, D10 vanc/zyvox, D7 meropenem WBC WNL, AF, SCr WNL 1/25 R lateral chest tube 2/1 anterior chest tube  Off pressors, on RA All cultures negative, Final  Plan: Continue Meropenem 1 gm IV q8h Continue zyvox 600 mg po BID ? LOT of abx  Height: 5\' 11"  (180.3 cm) Weight: 65.1 kg (143 lb 8.3 oz) IBW/kg (Calculated) : 75.3  Temp (24hrs), Avg:97.6 F (36.4 C), Min:97 F (36.1 C), Max:97.8 F (36.6 C)  Recent Labs  Lab 12/28/20 0919 12/28/20 1140 12/28/20 1720 12/29/20 1336 12/30/20 0219 12/30/20 2237 12/31/20 0229 12/31/20 1630 01/02/21 0215 01/03/21 0252  WBC 37.3*  --  23.1* 16.2* 15.5*  --  18.0*  --  10.3 10.5  CREATININE 0.88  --  0.91 0.77 0.70  --  0.84  --  0.70 0.82  LATICACIDVEN 2.3* 1.0 1.3  --   --   --  1.3  --   --   --   VANCOTROUGH  --   --   --   --   --   --   --  13*  --   --   VANCOPEAK  --   --   --   --   --  22*  --   --   --   --     Estimated Creatinine Clearance: 52.9 mL/min (by C-G formula based on SCr of 0.82 mg/dL).    Allergies  Allergen Reactions  . Coconut Flavor [Flavoring Agent] Hypertension    Anything that is related to coconut---LOSS OF CONSCIOUSNESS (SYNCOPE)  . Coconut Oil Other (See Comments)    Anything that is related to coconut--- LOSS OF CONSCIOUSNESS (SYNCOPE)  . Doxycycline Hyclate Hives  . Food Anaphylaxis    ALLERGIC TO ALL NUTS WITH THE EXCEPTION OF CASHEWS, ALMONDS  . Black [employer]  Other (See Comments)    Passes out ( can only eat almonds or peanuts)  . Coconut Oil Other (See Comments)    Faints   . Doxycycline Rash   Antimicrobials this admission:  1/16 CTX >> 1/11 1/16 azith> 1/23 1/24 cefepime >>1/27 1/24 vancomycin >>1/31 1/31 zyvox >> 1/27 meropenem >>  Dose adjustments this admission:  1/29 Vpk= 22 , 1/30 VT = 13 -  per RN, level collected before dose was given (AUC 424) --> cont vanc 1gm q24h Microbiology results:  1/16 BCx: NGF 1/17 Pleural fluid: ngF 1/18 MRSA PCR + 1/25: chest tube abscess: NGF  1/27 BCx4: ngF 1/27 UCx: NGF  Thank you for allowing pharmacy to be a part of this patient's care.  Eudelia Bunch, Pharm.D 01/03/2021 10:20 AM

## 2021-01-03 NOTE — TOC Progression Note (Signed)
Transition of Care (TOC) - Progression Note    Patient Details  Name: Casey Gates. MRN: [id number] Date of Birth: 08-Dec-1925  Transition of Care [facility]) CM/SW Contact  Leeroy Cha, RN Phone Number: 01/03/2021, 8:01 AM  Clinical Narrative:    Septic shock- ongoing vasopressor requirements felt to be due to incomplete source control, although improved. Residual loculated subscapular component of empyema. Cultures have remained negative.  -Discussed with IR, planning for small bore percutaneous chest tube to be placed in that collection this afternoon. Likely will need TPA & Dnase into that tube to fully resolve it. Appreciate IR's assistance. Culture from fluid collection requested.  -con't broad spectrum antibiotics  -con't vasopressors as required to maintain MAP >65  -con't midodrine  -con't chest tube to suction  -con't stress dose steroids      Permanent Afib, rate controlled  -con't amio & dig  -heparin gtt on hold for procedure today, can resume after tube is placed    Acute anemia, stable. Due to acute illness.  -transfuse for Hb<7 or hemodynamically significant bleeding   Iv aminodarone, heparin, merrem, iv neosynephrine-bp87/47-117/92- Had pigtail chest tube inserted on [id number] for loculated pl. Fld. planSNF    Expected Discharge Plan: Home/Self Care Barriers to Discharge: Continued Medical Work up  Expected Discharge Plan and Services Expected Discharge Plan: Home/Self Care   Discharge Planning Services: CM Consult   Living arrangements for the past 2 months: Apartment                                       Social Determinants of Health (SDOH) Interventions    Readmission Risk Interventions No flowsheet data found.

## 2021-01-03 NOTE — Progress Notes (Signed)
eLink Physician-Brief Progress Note Patient Name: Casey Gates. DOB: 1925-12-09 MRN: [id number]   Date of Service  01/03/2021  HPI/Events of Note  urinary retention RN asks for order for I&O cath  eICU Interventions  ordered     Intervention Category Minor Interventions: Other:  Elmer Sow 01/03/2021, 6:43 AM

## 2021-01-03 NOTE — Progress Notes (Signed)
Referring Physician(s): Clark,L  Supervising Physician: Ruthann Cancer  Patient Status:  [facility] - In-pt  Chief Complaint: Loculated right pleural effusion/empyema, back pain   Subjective: Pt c/o some soreness at rt post chest tube insertion site; off pressors now   Allergies: Coconut flavor [flavoring agent], Coconut oil, Doxycycline hyclate, Food, Black walnut flavor, Coconut oil, and Doxycycline  Medications: Prior to Admission medications   Medication Sig Start Date End Date Taking? Authorizing Provider  apixaban (ELIQUIS) 2.5 MG TABS tablet Take 1 tablet (2.5 mg total) by mouth 2 (two) times daily. 07/24/20  Yes Sherran Needs, NP  diltiazem (CARDIZEM SR) 120 MG 12 hr capsule Take 1 capsule (120 mg total) by mouth 2 (two) times daily. 07/24/20 08/24/20 Yes Sherran Needs, NP  gabapentin (NEURONTIN) 300 MG capsule Take 300 mg by mouth 3 (three) times daily as needed (legs-per MAR). Restless leg 05/14/20  Yes [provider]  mirtazapine (REMERON) 30 MG tablet Take 30 mg by mouth at bedtime. 11/21/20  Yes [provider]  MOVANTIK 25 MG TABS tablet Take 25 mg by mouth daily. 09/10/19  Yes [provider]  oxymorphone (OPANA) 10 MG tablet Take 10 mg by mouth every 6 (six) hours as needed for pain.  07/03/18  Yes [provider]  Jay Schlichter Oil (ARTIFICIAL TEARS) ointment Place 1 application into the left eye 3 (three) times daily. Left lower eye lid   Yes [provider]     Vital Signs: BP 104/61 (BP Location: Left Arm)   Pulse (!) 103   Temp (!) 97 F (36.1 C) (Axillary)   Resp 11   Ht 5\' 11"  (1.803 m)   Wt 143 lb 8.3 oz (65.1 kg)   SpO2 98%   BMI 20.02 kg/m   Physical Exam awake/alert; rt chest tubes intact, both to suction but valve on upper pleuracvac not indicating adequate suction; no obvious air leak. OP  right upper tube 100 cc yellow fluid, right lower tube 1.35 liters serosang fluid  Imaging: CT CHEST  W CONTRAST  Result Date: 01/01/2021 CLINICAL DATA:  Empyema. EXAM: CT CHEST WITH CONTRAST TECHNIQUE: Multidetector CT imaging of the chest was performed during intravenous contrast administration. CONTRAST:  5mL OMNIPAQUE IOHEXOL 300 MG/ML  SOLN COMPARISON:  December 25, 2020. FINDINGS: Cardiovascular: Atherosclerosis of thoracic aorta is noted without dissection. 4.1 cm ascending thoracic aortic aneurysm is noted. Coronary artery calcifications are noted. Mild cardiomegaly is noted without pericardial effusion. Mediastinum/Nodes: No enlarged mediastinal, hilar, or axillary lymph nodes. Thyroid gland, trachea, and esophagus demonstrate no significant findings. Lungs/Pleura: Interval placement of pigtail chest tube into right pleural effusion noted on prior exam. The effusion is significantly smaller, with smaller loculated component seen in the upper lobe posteriorly. Small amount of air is noted within its posterior portion. Right middle and lower lobe opacities are noted concerning for pneumonia. Mild left pleural effusion is noted with associated atelectasis or infiltrate of the left lower lobe. Upper Abdomen: No acute abnormality. Musculoskeletal: No chest wall abnormality. No acute or significant osseous findings. IMPRESSION: 1. Interval placement of pigtail chest tube into right pleural effusion noted on prior exam. The effusion is significantly smaller, with smaller loculated component seen in the upper lobe posteriorly. Small amount of air is noted within its posterior portion. 2. Right middle and lower lobe opacities are noted concerning for pneumonia. 3. Mild left pleural effusion is noted with associated atelectasis or infiltrate of the left lower lobe. 4. Coronary artery calcifications are  noted suggesting coronary artery disease. 5. 4.1 cm ascending thoracic aortic aneurysm. Recommend annual imaging followup by CTA or MRA. This recommendation follows 2010 ACCF/AHA/AATS/ACR/ASA/SCA/SCAI/SIR/STS/SVM  Guidelines for the Diagnosis and Management of Patients with Thoracic Aortic Disease. Circulation. 2010; 121: Y865-H846. Aortic aneurysm NOS (ICD10-I71.9). 6. Aortic atherosclerosis. Aortic Atherosclerosis (ICD10-I70.0). Electronically Signed   By: Marijo Conception M.D.   On: 01/01/2021 16:45   DG Chest Port 1 View  Result Date: 01/03/2021 CLINICAL DATA:  Pleural effusion. EXAM: PORTABLE CHEST 1 VIEW COMPARISON:  CT 01/02/2021, 01/01/2021.  Chest x-ray 01/02/2021. FINDINGS: New right pigtail chest tube noted over the right upper chest. Stable right lower pigtail chest tube. Small right base pneumothorax again noted. Persistent bibasilar infiltrates. Small bilateral pleural effusions. Stable cardiomegaly. IMPRESSION: 1. New right pigtail chest tube noted over the right upper chest. Stable right lower pigtail chest tube. Small right base pneumothorax again noted. 2. Persistent bibasilar infiltrates without interim change. Small bilateral pleural effusions again noted. 3.  Stable cardiomegaly. Electronically Signed   By: Marcello Moores  Register   On: 01/03/2021 05:48   DG Chest Port 1 View  Result Date: 01/02/2021 CLINICAL DATA:  Empyema. EXAM: PORTABLE CHEST 1 VIEW COMPARISON:  CT 01/01/2021.  Chest x-ray 01/01/2021. FINDINGS: Right chest tube in stable position. Small right pleural effusion with a small amount of pleural air again noted. Persistent right base atelectasis. Persistent left base atelectasis/infiltrate small left pleural effusion. Cardiomegaly. No pulmonary venous congestion. IMPRESSION: 1. Right chest tube in stable position. Small right pleural effusion with a small amount of pleural air again noted. Persistent right base atelectasis. 2. Persistent left base atelectasis/infiltrate and small left pleural effusion. Electronically Signed   By: Marcello Moores  Register   On: 01/02/2021 06:52   DG CHEST PORT 1 VIEW  Result Date: 01/01/2021 CLINICAL DATA:  Empyema. EXAM: PORTABLE CHEST 1 VIEW COMPARISON:   12/30/2020 and CT chest 12/25/2020. FINDINGS: Patient is slightly rotated. Trachea is midline. Heart size stable. Small bore pigtail catheter is seen in the lower right hemithorax with a small right pleural effusion and right basilar atelectasis. No definite pneumothorax. Left lower lobe collapse/consolidation with a small to moderate left pleural effusion. IMPRESSION: 1. Small right pleural effusion with right basilar pulmonary parenchymal volume loss and chest tube in place. No definite pneumothorax. 2. Left lower lobe collapse/consolidation with a small to moderate left pleural effusion. Electronically Signed   By: Lorin Picket M.D.   On: 01/01/2021 12:33   ECHOCARDIOGRAM COMPLETE  Result Date: 12/30/2020    ECHOCARDIOGRAM REPORT   Patient Name:   Casey Gates. Date of Exam: 12/30/2020 Medical Rec #:  [id number]          Height:       71.0 in Accession #:    [id number]         Weight:       144.2 lb Date of Birth:  11-21-1926         BSA:          1.835 m Patient Age:    87 years           BP:           81/59 mmHg Patient Gender: M                  HR:           120 bpm. Exam Location:  Inpatient Procedure: 2D Echo, Cardiac Doppler and Color Doppler Indications:    I48.1 Persistent  atrial fibrillation  History:        Patient has prior history of Echocardiogram examinations, most                 recent 12/31/2017. Risk Factors:Sleep Apnea. Cancer.  Sonographer:    Jonelle Sidle Dance Referring Phys: Benson  1. Left ventricular ejection fraction, by estimation, is 40 to 45%. Difficult to determine systolic function as poor image quality and patient is in Afib with RVR, but appears left ventricle has mildly decreased function. Would consider repeat limited echo with contrast once heart rate better controlled. Left ventricular endocardial border not optimally defined to evaluate regional wall motion. There is mild left ventricular hypertrophy. Left ventricular diastolic parameters are  indeterminate.  2. Right ventricular systolic function is mildly reduced. The right ventricular size is mildly enlarged. There is normal pulmonary artery systolic pressure. The estimated right ventricular systolic pressure is [id number] mmHg.  3. Left atrial size was moderately dilated.  4. The mitral valve is degenerative. No evidence of mitral valve regurgitation. Moderate mitral annular calcification.  5. The aortic valve was not well visualized. Aortic valve regurgitation is trivial. No aortic stenosis is present.  6. Aortic dilatation noted. There is mild dilatation of the aortic root, measuring 42 mm. There is mild dilatation of the ascending aorta, measuring 41 mm.  7. The inferior vena cava is dilated in size with <50% respiratory variability, suggesting right atrial pressure of 15 mmHg. FINDINGS  Left Ventricle: Left ventricular ejection fraction, by estimation, is 40 to 45%. The left ventricle has mildly decreased function. Left ventricular endocardial border not optimally defined to evaluate regional wall motion. The left ventricular internal cavity size was small. There is mild left ventricular hypertrophy. Left ventricular diastolic parameters are indeterminate. Right Ventricle: The right ventricular size is mildly enlarged. Right vetricular wall thickness was not well visualized. Right ventricular systolic function is mildly reduced. There is normal pulmonary artery systolic pressure. The tricuspid regurgitant velocity is 2.08 m/s, and with an assumed right atrial pressure of 15 mmHg, the estimated right ventricular systolic pressure is [id number] mmHg. Left Atrium: Left atrial size was moderately dilated. Right Atrium: Right atrial size was normal in size. Pericardium: There is no evidence of pericardial effusion. Mitral Valve: The mitral valve is degenerative in appearance. Moderate mitral annular calcification. No evidence of mitral valve regurgitation. Tricuspid Valve: The tricuspid valve is normal in  structure. Tricuspid valve regurgitation is trivial. Aortic Valve: The aortic valve was not well visualized. Aortic valve regurgitation is trivial. Aortic regurgitation PHT measures 352 msec. No aortic stenosis is present. Pulmonic Valve: The pulmonic valve was not well visualized. Pulmonic valve regurgitation is not visualized. Aorta: Aortic dilatation noted. There is mild dilatation of the aortic root, measuring 42 mm. There is mild dilatation of the ascending aorta, measuring 41 mm. Venous: The inferior vena cava is dilated in size with less than 50% respiratory variability, suggesting right atrial pressure of 15 mmHg. IAS/Shunts: The interatrial septum was not well visualized.  LEFT VENTRICLE PLAX 2D LVIDd:         3.70 cm LVIDs:         2.70 cm LV PW:         1.30 cm LV IVS:        1.10 cm LVOT diam:     2.20 cm LV SV:         40 LV SV Index:   22 LVOT Area:     3.80 cm  RIGHT VENTRICLE          IVC RV Basal diam:  3.70 cm  IVC diam: 2.20 cm RV Mid diam:    2.90 cm TAPSE (M-mode): 1.8 cm LEFT ATRIUM           Index       RIGHT ATRIUM           Index LA diam:      4.90 cm 2.67 cm/m  RA Area:     17.80 cm LA Vol (A2C): 81.3 ml 44.31 ml/m RA Volume:   45.60 ml  24.85 ml/m LA Vol (A4C): 79.7 ml 43.44 ml/m  AORTIC VALVE LVOT Vmax:   67.30 cm/s LVOT Vmean:  49.000 cm/s LVOT VTI:    0.105 m AI PHT:      352 msec  AORTA Ao Root diam: 4.20 cm Ao Asc diam:  4.50 cm MITRAL VALVE               TRICUSPID VALVE MV Area (PHT): 3.08 cm    TR Peak grad:   17.3 mmHg MV Decel Time: 246 msec    TR Vmax:        208.00 cm/s MV E velocity: 59.50 cm/s MV A velocity: 52.80 cm/s  SHUNTS MV E/A ratio:  1.13        Systemic VTI:  0.10 m                            Systemic Diam: 2.20 cm Oswaldo Milian MD Electronically signed by Oswaldo Milian MD Signature Date/Time: 12/30/2020/2:26:21 PM    Final    CT IMAGE GUIDED DRAINAGE BY PERCUTANEOUS CATHETER  Result Date: 01/02/2021 INDICATION: Right-sided empyema and status  post CT-guided percutaneous drainage catheter placement on 12/26/2020. Follow-up CT demonstrates improvement but some persistent loculated fluid in the superior and lateral hemithorax. Request made to try to drain this fluid as the patient is still requiring pressor support. EXAM: CT-GUIDED RIGHT-SIDED THORACOSTOMY TUBE PLACEMENT MEDICATIONS: The patient is currently admitted to the hospital and receiving intravenous antibiotics. The antibiotics were administered within an appropriate time frame prior to the initiation of the procedure. ANESTHESIA/SEDATION: No conscious sedation was administered due to tenuous blood pressure. COMPLICATIONS: Small pneumothorax. SIR LEVEL B - Normal therapy, includes overnight admission for observation. PROCEDURE: Informed written consent was obtained from the patient after a thorough discussion of the procedural risks, benefits and alternatives. All questions were addressed. Maximal Sterile Barrier Technique was utilized including caps, mask, sterile gowns, sterile gloves, sterile drape, hand hygiene and skin antiseptic. A timeout was performed prior to the initiation of the procedure. Initial CT was performed in a decubitus position with the right side up. The right-sided was then rolled down slightly and additional localizing CT performed. The skin was prepped with chlorhexidine. Local anesthesia was provided with 1% lidocaine. Under CT guidance, an 18 gauge trocar needle was advanced into the posterolateral right pleural space. A guidewire was then advanced via the needle and the needle removed. The percutaneous tract was dilated and a 12 French pigtail drainage catheter advanced into the right lateral pleural space. Catheter position was confirmed by CT. The catheter was then secured at the skin with a Prolene retention suture, StatLock device and dressing. FINDINGS: Initial imaging demonstrates a small area of loculated lentiform shaped fluid in the posterolateral right upper  hemithorax. This was targeted for chest tube placement and during needle advancement, the fluid gradually diminished and dissipated  consistent with likely a free-flowing component posteriorly and along the course of the major fissure. As the needle was advanced into the pleural space, there was no fluid return and imaging demonstrated a lateral pneumothorax. A guidewire was able to be advanced into the pleural space allowing placement of a drainage catheter. After drain placement, aspiration yielded only air and no fluid return. The drain was connected to a [place] Pleur-evac device and will be connected to wall suction at -20 cm of water. IMPRESSION: During planned percutaneous drainage catheter placement into what appear to be a loculated residual component of empyema in the superior right posterolateral pleural space, the fluid appeared to dissipate as a trocar needle was advanced and resulted in development of a pneumothorax. This was treated by placement of a 12 French pigtail catheter into the lateral pleural space which will be connected to a Pleur-evac device and wall suction. The fluid component clearly dissipated away consistent with a free-flowing component in the pleural space which was likely promoted when the patient was in a decubitus position. Electronically Signed   By: Aletta Edouard M.D.   On: 01/02/2021 17:19    Labs:  CBC: Recent Labs    12/30/20 0219 12/31/20 0229 01/02/21 0215 01/03/21 0252  WBC 15.5* 18.0* 10.3 10.5  HGB 10.9* 12.2* 11.0* 13.0  HCT 35.2* 39.2 35.9* 41.4  PLT 434* 461* 449* 390    COAGS: Recent Labs    09/01/20 0824 12/28/20 1720 12/30/20 1401  INR 1.2 1.4*  --   APTT  --   --  33    BMP: Recent Labs    06/26/20 0005 07/13/20 0944 07/27/20 1230 09/01/20 0824 10/13/20 0831 12/30/20 0219 12/31/20 0229 01/02/21 0215 01/03/21 0252  NA 141 142 139 143   < > 139 137 137 137  K 4.5 4.2 4.4 4.0   < > 3.6 4.1 4.3 4.6  CL 104 108 102 106   < >  110 107 107 105  CO2 28 28 27 29    < > 23 21* 23 25  GLUCOSE 112* 58* 102* 88   < > 91 134* 115* 109*  BUN 21 19 22  24*   < > 18 19 21 22   CALCIUM 8.9 9.3 9.2 9.4   < > 7.6* 7.7* 7.8* 8.0*  CREATININE 0.97 1.05 1.08 0.98   < > 0.70 0.84 0.70 0.82  GFRNONAA >60 >60 60* >60   < > >60 >60 >60 >60  GFRAA >60 >60 >60 >60  --   --   --   --   --    < > = values in this interval not displayed.    LIVER FUNCTION TESTS: Recent Labs    12/28/20 0919 12/28/20 1720 12/30/20 0219 12/31/20 0229  BILITOT 0.4 0.7 0.4 0.2*  AST 38 35 35 53*  ALT 31 31 27  40  ALKPHOS 84 81 68 72  PROT 5.0* 4.9* 4.5* 4.8*  ALBUMIN 1.8* 1.7* 1.5* 1.7*    Assessment and Plan: Patient with history of atrial fibrillation, respiratory failure secondary to pneumonia/right chest empyema/loculated pleural effusion; status post right basilar chest drain placement on 12/26/2020; s/p drainage of loculated residual component of empyema in the superior right posterior lateral pleural space and subsequent small pneumothorax 2/1; afebrile, WBC normal, hemoglobin 13, creatinine normal; both tubes to wall suction, latest chest x-ray today reveals stable chest tubes with small right base pneumothorax, persistent bibasilar infiltrates without interval change, small bilateral pleural effusions, stable cardiomegaly; plans as  outlined by CCM.   Electronically Signed: D. Rowe Robert, PA-C 01/03/2021, 12:00 PM   I spent a total of 15 minutes at the the patient's bedside AND on the patient's hospital floor or unit, greater than 50% of which was counseling/coordinating care for right upper and lower chest drains    Patient ID: [name]., male   DOB: 1926-03-15, 87 y.o.   MRN: [id number]

## 2021-01-03 NOTE — Progress Notes (Signed)
[place] for heparin Indication: atrial fibrillation  Allergies  Allergen Reactions  . Coconut Flavor [Flavoring Agent] Hypertension    Anything that is related to coconut---LOSS OF CONSCIOUSNESS (SYNCOPE)  . Coconut Oil Other (See Comments)    Anything that is related to coconut--- LOSS OF CONSCIOUSNESS (SYNCOPE)  . Doxycycline Hyclate Hives  . Food Anaphylaxis    ALLERGIC TO ALL NUTS WITH THE EXCEPTION OF CASHEWS, ALMONDS  . Black [employer]  Other (See Comments)    Passes out ( can only eat almonds or peanuts)  . Coconut Oil Other (See Comments)    Faints   . Doxycycline Rash    Patient Measurements: Height: 5\' 11"  (180.3 cm) Weight: 65.1 kg (143 lb 8.3 oz) IBW/kg (Calculated) : 75.3 Heparin Dosing Weight: 65.4 kg  Vital Signs: Temp: 97.7 F (36.5 C) (02/02 0322) Temp Source: Oral (02/02 0322) BP: 114/84 (02/02 0600) Pulse Rate: 83 (02/02 0500)  Labs: Recent Labs    12/31/20 0830 12/31/20 1642 01/01/21 0226 01/02/21 0215 01/03/21 0252  HGB  --   --   --  11.0* 13.0  HCT  --   --   --  35.9* 41.4  PLT  --   --   --  449* 390  HEPARINUNFRC 0.43   < > 0.46 0.47 0.61  CREATININE  --   --   --  0.70 0.82  TROPONINIHS 9  --   --   --   --    < > = values in this interval not displayed.    Estimated Creatinine Clearance: 52.9 mL/min (by C-G formula based on SCr of 0.82 mg/dL).   Assessment: Pharmacy consulted to dose heparin for this patient who had previously been on apixaban for atrial fibrillation.  Last dose of apixaban was 12/25/2020 at 1122 and was held for thoracentesis and possible other procedures.  Age > 52 years and history of anemia, prostate cancer, GI bleeding, and blood transfusion. Baseline heparin level 0.19 and aPTT 33 which correlate.  01/03/2021 Heparin level 0.61, therapeutic on 1200 units/hr CBC stable No bleeding or line issues per RN Heparin off yesterday for IR to place CT, resumed at 1720  Goal  of Therapy:  Heparin level 0.3-0.7 units/ml aPTT 66-102 seconds Monitor platelets by anticoagulation protocol: Yes   Plan:  Continue heparin drip at 1200 units/hr Monitor daily heparin level & daily CBC   Eudelia Bunch, Pharm.D 01/03/2021 8:00 AM Clinical Pharmacist [place] Please utilize Amion for appropriate phone number to reach the unit pharmacist ([place]) 01/03/2021 8:00 AM

## 2021-01-03 NOTE — Progress Notes (Addendum)
NAME:  Draxton Luu., MRN:  [id number], DOB:  January 02, 1926, LOS: 75 ADMISSION DATE:  12/17/2020, CONSULTATION DATE:  1/24 REFERRING MD:  Dr. Darrick Meigs, CHIEF COMPLAINT:  Exudative Pleural Effusion   Brief History:  87 year old male initially admitted 1/16 with HAP and associated right-sided loculated pleural effusion.  Patient completed broad-spectrum antibiotic course and despite appropriate therapy developed worsening loculated right pleural effusion and evolution of air/gas fluid level.  Right chest tube was placed and the patient completed intrapleural fibrinolytic therapy.  Past Medical History:  OSA Atrial fibrillation on chronic anticoagulation Chronic pain syndrome  Significant Hospital Events:  1/16 Admit, started on broad spectrum abx 1/17 Thoracentesis with exudative pleural effusion, 243ml hazy amber fluid removed 1/18 AFwRVR, Cardiology consulted 1/19 Completed azithromycin  1/23 Completed ceftriaxone  1/24 PCCM consulted  1/26 R chest tube with intraplural tPA / dornase instilled  1/28 Completed 3rd dose intrapleural fibrinolytic therapy  1/29 [place] O2, afebrile with normal mental status. Labile rhythm / BP on low dose neosynephrine   1/30 [place] O2, cortisol 16.1, on stress dose steroids on neo. On dig/amio for AF.  2/01 Anterior chest tube placed per IR into R lateral superior loculated pleural fluid   Consults:  Cardiology   Procedures:  R Lateral Chest Tube 1/25 (IR) >>  R Anterior Chest Tube 2/1 >>   Significant Diagnostic Tests:   CT Chest 1/24 >> moderate to large loculated right pleural effusion is increased in volume when compared to 1/16.  New focus of gas with air-fluid level in the posterior compartment of the effusion.  Progressive atelectasis of the entire right lower lobe with partial atelectasis of the right middle lobe and right upper lobe.  Progressive consolidation involving the posterior medial left lower lobe containing air bronchograms.  Micro Data:   COVID 1/16 >> negative  BCx2 1/16 >> negative  Pleural fluid 1/17 >> negative  MRSA PCR 1/18 >> positive  Chest tube aspirate 1/25 >> negative  BCx2 1/27 >>  UC 1/27 >> negative   Antimicrobials:  Azithro 1/16 >> 1/20  Ceftriaxone 1/16 >> 1/23  Cefepime 1/24 >> 1/27  Vanco 1/24 >>  Meropenem 1/29 >>   Interim History / Subjective:  S/p anterior chest tube placement 2/1  Pt resting in bed on RA  Chest tube drainage - Apical 147ml, lateral 130 ml in chamber on exam.  Off vasopressors   Objective   Blood pressure 114/84, pulse 83, temperature 97.7 F (36.5 C), temperature source Oral, resp. rate 14, height 5\' 11"  (1.803 m), weight 65.1 kg, SpO2 98 %.        Intake/Output Summary (Last 24 hours) at 01/03/2021 0814 Last data filed at 01/03/2021 0600 Gross per 24 hour  Intake 1462.23 ml  Output 2150 ml  Net -687.77 ml   Filed Weights   12/26/20 1112 12/29/20 0615 12/31/20 0630  Weight: 62.7 kg 65.4 kg 65.1 kg    Examination: General: frail, elderly adult male lying in bed resting  HEENT: MM pink/moist, anicteric Neuro: AAOx4, speech clear, HOH, MAE  CV: s1s2 irr irr, AF 80's on monitor, no m/r/g PULM: non-labored on RA, crackles on right, clear on left, CTx2 on right  GI: soft, bsx4 active  Extremities: warm/dry, no edema  Skin: no rashes or lesions  Resolved Hospital Problem list      Assessment & Plan:   Acute Hypoxemic Respiratory Failure secondary to HAP with Empyema  Wheezing  OSA - does not wear CPAP  -O2 as needed  for sats >90% -OOB / mobilize -pulmonary hygiene   Right Empyema secondary to HAP (NOS) Suspect in setting of aspiration.  S/p IR chest tube 1/25, fibrinolytics. Apical CT placed 2/1 for residual loculated empyema  -follow chest tube output -intermittent CXR -CT's to suction  -keep chest tubes overnight 2/2, review CXR in am + drainage, anticipate may be able to pull one of the chest tubes  Septic Shock  Adrenal Insufficiency  Tx to ICU  1/27.  Exam does not match clinically, cortisol 16.1 ?if relative AI was component -continue midodrine, consider weaning 2/3 -stress dose steroids, consider weaning 2/3 if BP remains stable  -ABX as above   Permanent AF Acute Systolic CHF  On eliquis, cardizem at home.  New systolic CHF (since last ECHO in 2019). Rates improved after digoxin / amio.  -appreciate Cardiology input  -continue digoxin -TSH 4.5, assess free T3, T4 -heparin gtt per pharmacy, consider transition back to oral anticoagulation once clear no further procedures  Chronic Pain  On PRN gabapentin, Opana at home  -PRN low dose opioids  -supportive care   Acute Metabolic Encephalopathy  Thought secondary to sepsis complicated by hearing loss. Resolved.  -promote sleep / wake cycle  -PT efforts  -delirium prevention measures - lights on during day, physical exertion etc   Anemia  -follow CBC  -transfuse for Hgb <7%   Hypophosphatemia  -monitor, replace as indicated   Moderate Protein Calorie Malnutrition  -diet as tolerated   Hyperglycemia  -SSI  Urinary Retention Hx Prostate Cancer -RN to call for coude cath, if unable to place, will call Urology    Best practice (evaluated daily)  Diet: D3 diet  Pain/Anxiety/Delirium protocol (if indicated): as above  VAP protocol (if indicated): n/a  DVT prophylaxis: Heparin gtt  GI prophylaxis: n/a  Glucose control: SSI  Mobility: As tolerated  Disposition: Tx to SDU, to TRH as of 2/3 0700.  PCCM will continue to follow for CT management.   Goals of Care:  Last date of multidisciplinary goals of care discussion:1/29  Family and staff present: Son, MD Summary of discussion: full medical care but no CPR Follow up goals of care discussion due: 2/5  Code Status: DNR   Son updated at bedside 2/2 in detail on plan of care.   Labs   CBC: Recent Labs  Lab 12/28/20 0919 12/28/20 1720 12/29/20 1336 12/30/20 0219 12/31/20 0229 01/02/21 0215 01/03/21 0252   WBC 37.3*   < > 16.2* 15.5* 18.0* 10.3 10.5  NEUTROABS 32.9*  --   --   --   --   --   --   HGB 11.6*   < > 11.2* 10.9* 12.2* 11.0* 13.0  HCT 37.0*   < > 36.4* 35.2* 39.2 35.9* 41.4  MCV 90.2   < > 91.0 89.8 90.7 90.0 88.7  PLT 361   < > 425* 434* 461* 449* 390   < > = values in this interval not displayed.    Basic Metabolic Panel: Recent Labs  Lab 12/28/20 1720 12/29/20 0248 12/29/20 1336 12/30/20 0219 12/31/20 0229 01/01/21 0226 01/02/21 0215 01/03/21 0252  NA 140  --  140 139 137  --  137 137  K 3.8  --  3.5 3.6 4.1  --  4.3 4.6  CL 108  --  108 110 107  --  107 105  CO2 27  --  24 23 21*  --  23 25  GLUCOSE 116*  --  100* 91 134*  --  115* 109*  BUN 17  --  16 18 19   --  21 22  CREATININE 0.91  --  0.77 0.70 0.84  --  0.70 0.82  CALCIUM 7.5*  --  7.5* 7.6* 7.7*  --  7.8* 8.0*  MG 2.2  --   --  2.0 2.2  --   --  2.1  PHOS 3.3   < >  --  2.5 2.4* 2.9 2.8 2.9   < > = values in this interval not displayed.   GFR: Estimated Creatinine Clearance: 52.9 mL/min (by C-G formula based on SCr of 0.82 mg/dL). Recent Labs  Lab 12/28/20 0919 12/28/20 1140 12/28/20 1720 12/29/20 0248 12/29/20 1336 12/30/20 0219 12/31/20 0229 01/02/21 0215 01/03/21 0252  PROCALCITON  --   --  0.13 0.12  --  0.10  --   --   --   WBC 37.3*  --  23.1*  --    < > 15.5* 18.0* 10.3 10.5  LATICACIDVEN 2.3* 1.0 1.3  --   --   --  1.3  --   --    < > = values in this interval not displayed.    Liver Function Tests: Recent Labs  Lab 12/28/20 0919 12/28/20 1720 12/30/20 0219 12/31/20 0229  AST 38 35 35 53*  ALT 31 31 27  40  ALKPHOS 84 81 68 72  BILITOT 0.4 0.7 0.4 0.2*  PROT 5.0* 4.9* 4.5* 4.8*  ALBUMIN 1.8* 1.7* 1.5* 1.7*   No results for input(s): LIPASE, AMYLASE in the last 168 hours. No results for input(s): AMMONIA in the last 168 hours.  ABG    Component Value Date/Time   PHART 7.350 12/28/2020 1640   PCO2ART 48.6 (H) 12/28/2020 1640   PO2ART 63.8 (L) 12/28/2020 1640   HCO3  26.3 12/28/2020 1640   TCO2 22 02/16/2018 0732   O2SAT 91.0 12/28/2020 1640     Coagulation Profile: Recent Labs  Lab 12/28/20 1720  INR 1.4*    Cardiac Enzymes: No results for input(s): CKTOTAL, CKMB, CKMBINDEX, TROPONINI in the last 168 hours.  HbA1C: Hgb A1c MFr Bld  Date/Time Value Ref Range Status  12/30/2020 02:19 AM 5.9 (H) 4.8 - 5.6 % Final    Comment:    (NOTE) Pre diabetes:          5.7%-6.4%  Diabetes:              >6.4%  Glycemic control for   <7.0% adults with diabetes     CBG: Recent Labs  Lab 01/02/21 0732 01/02/21 1203 01/02/21 1639 01/02/21 2100 01/03/21 0736  GLUCAP 103* 93 92 105* 107*     Critical care time:       Noe Gens, MSN, APRN, NP-C, AGACNP-BC [facility] 01/03/2021, 8:14 AM   Please see [email or web address] for pager details.   From 7A-7P if no response, please call [phone] After hours, please call ELink [phone]

## 2021-01-03 NOTE — Progress Notes (Signed)
Progress Note  Patient Name: Casey Gates. Date of Encounter: 01/03/2021  [facility] Cardiologist: Larae Grooms, MD   Subjective   Feeling well. Denies any chest pain or shortness of breath.  Inpatient Medications    Scheduled Meds: . artificial tears  1 application Left Eye TID  . budesonide (PULMICORT) nebulizer solution  0.5 mg Nebulization BID  . chlorhexidine  15 mL Mouth Rinse BID  . Chlorhexidine Gluconate Cloth  6 each Topical Daily  . digoxin  0.0625 mg Oral Daily  . feeding supplement  237 mL Oral BID BM  . hydrocortisone sod succinate (SOLU-CORTEF) inj  50 mg Intravenous Q6H  . insulin aspart  0-15 Units Subcutaneous TID WC  . linezolid  600 mg Oral Q12H  . mouth rinse  15 mL Mouth Rinse q12n4p  . midodrine  5 mg Oral Q8H  . sodium chloride flush  10 mL Intracatheter Q8H   Continuous Infusions: . sodium chloride Stopped (01/02/21 1136)  . heparin 1,200 Units/hr (01/03/21 0600)  . meropenem (MERREM) IV Stopped (01/03/21 0551)   PRN Meds: fentaNYL (SUBLIMAZE) injection, ipratropium-albuterol, lidocaine (PF), lip balm, metoprolol tartrate, ondansetron **OR** ondansetron (ZOFRAN) IV, oxyCODONE, Resource ThickenUp Clear   Vital Signs    Vitals:   01/03/21 0600 01/03/21 0800 01/03/21 0843 01/03/21 1049  BP: 114/84     Pulse:    (!) 103  Resp: 14     Temp:  (!) 97 F (36.1 C)    TempSrc:  Axillary    SpO2: 98%  98%   Weight:      Height:        Intake/Output Summary (Last 24 hours) at 01/03/2021 1131 Last data filed at 01/03/2021 0600 Gross per 24 hour  Intake 956.18 ml  Output 2150 ml  Net -1193.82 ml   Last 3 Weights 12/31/2020 12/29/2020 12/26/2020  Weight (lbs) 143 lb 8.3 oz 144 lb 2.9 oz 138 lb 3.7 oz  Weight (kg) 65.1 kg 65.4 kg 62.7 kg      Telemetry    Atrial fibrillation. Rates 80s to 100s. PVCs.- Personally Reviewed  ECG    n/a - Personally Reviewed  Physical Exam   VS:  BP 114/84   Pulse (!) 103   Temp (!) 97 F (36.1  C) (Axillary)   Resp 14   Ht 5\' 11"  (1.803 m)   Wt 65.1 kg   SpO2 98%   BMI 20.02 kg/m  , BMI Body mass index is 20.02 kg/m. GENERAL:  Well appearing HEENT: Pupils equal round and reactive, fundi not visualized, oral mucosa unremarkable NECK:  No jugular venous distention, waveform within normal limits, carotid upstroke brisk and symmetric, no bruits LUNGS:  Improved aeration.  No crackles. HEART:  RRR.  PMI not displaced or sustained,S1 and S2 within normal limits, no S3, no S4, no clicks, no rubs,  murmurs ABD:  Flat, positive bowel sounds normal in frequency in pitch, no bruits, no rebound, no guarding, no midline pulsatile mass, no hepatomegaly, no splenomegaly EXT:  2 plus pulses throughout, no edema, no cyanosis no clubbing SKIN:  No rashes no nodules NEURO:  Cranial nerves II through XII grossly intact, motor grossly intact throughout PSYCH:  Cognitively intact, oriented to person place and time   Labs    High Sensitivity Troponin:   Recent Labs  Lab 12/28/20 1720 12/31/20 0830  TROPONINIHS 14 9      Chemistry Recent Labs  Lab 12/28/20 1720 12/29/20 1336 12/30/20 0219 12/31/20 0229 01/02/21  0215 01/03/21 0252  NA 140   < > 139 137 137 137  K 3.8   < > 3.6 4.1 4.3 4.6  CL 108   < > 110 107 107 105  CO2 27   < > 23 21* 23 25  GLUCOSE 116*   < > 91 134* 115* 109*  BUN 17   < > 18 19 21 22   CREATININE 0.91   < > 0.70 0.84 0.70 0.82  CALCIUM 7.5*   < > 7.6* 7.7* 7.8* 8.0*  PROT 4.9*  --  4.5* 4.8*  --   --   ALBUMIN 1.7*  --  1.5* 1.7*  --   --   AST 35  --  35 53*  --   --   ALT 31  --  27 40  --   --   ALKPHOS 81  --  68 72  --   --   BILITOT 0.7  --  0.4 0.2*  --   --   GFRNONAA >60   < > >60 >60 >60 >60  ANIONGAP 5   < > 6 9 7 7    < > = values in this interval not displayed.     Hematology Recent Labs  Lab 12/31/20 0229 01/02/21 0215 01/03/21 0252  WBC 18.0* 10.3 10.5  RBC 4.32 3.99* 4.67  HGB 12.2* 11.0* 13.0  HCT 39.2 35.9* 41.4  MCV 90.7  90.0 88.7  MCH 28.2 27.6 27.8  MCHC 31.1 30.6 31.4  RDW 14.1 14.0 14.1  PLT 461* 449* 390    BNPNo results for input(s): BNP, PROBNP in the last 168 hours.   DDimer No results for input(s): DDIMER in the last 168 hours.   Radiology    CT CHEST W CONTRAST  Result Date: 01/01/2021 CLINICAL DATA:  Empyema. EXAM: CT CHEST WITH CONTRAST TECHNIQUE: Multidetector CT imaging of the chest was performed during intravenous contrast administration. CONTRAST:  54mL OMNIPAQUE IOHEXOL 300 MG/ML  SOLN COMPARISON:  December 25, 2020. FINDINGS: Cardiovascular: Atherosclerosis of thoracic aorta is noted without dissection. 4.1 cm ascending thoracic aortic aneurysm is noted. Coronary artery calcifications are noted. Mild cardiomegaly is noted without pericardial effusion. Mediastinum/Nodes: No enlarged mediastinal, hilar, or axillary lymph nodes. Thyroid gland, trachea, and esophagus demonstrate no significant findings. Lungs/Pleura: Interval placement of pigtail chest tube into right pleural effusion noted on prior exam. The effusion is significantly smaller, with smaller loculated component seen in the upper lobe posteriorly. Small amount of air is noted within its posterior portion. Right middle and lower lobe opacities are noted concerning for pneumonia. Mild left pleural effusion is noted with associated atelectasis or infiltrate of the left lower lobe. Upper Abdomen: No acute abnormality. Musculoskeletal: No chest wall abnormality. No acute or significant osseous findings. IMPRESSION: 1. Interval placement of pigtail chest tube into right pleural effusion noted on prior exam. The effusion is significantly smaller, with smaller loculated component seen in the upper lobe posteriorly. Small amount of air is noted within its posterior portion. 2. Right middle and lower lobe opacities are noted concerning for pneumonia. 3. Mild left pleural effusion is noted with associated atelectasis or infiltrate of the left lower  lobe. 4. Coronary artery calcifications are noted suggesting coronary artery disease. 5. 4.1 cm ascending thoracic aortic aneurysm. Recommend annual imaging followup by CTA or MRA. This recommendation follows 2010 ACCF/AHA/AATS/ACR/ASA/SCA/SCAI/SIR/STS/SVM Guidelines for the Diagnosis and Management of Patients with Thoracic Aortic Disease. Circulation. 2010; [id number]. Aortic aneurysm NOS (ICD10-I71.9). 6. Aortic  atherosclerosis. Aortic Atherosclerosis (ICD10-I70.0). Electronically Signed   By: Marijo Conception M.D.   On: 01/01/2021 16:45   DG Chest Port 1 View  Result Date: 01/03/2021 CLINICAL DATA:  Pleural effusion. EXAM: PORTABLE CHEST 1 VIEW COMPARISON:  CT 01/02/2021, 01/01/2021.  Chest x-ray 01/02/2021. FINDINGS: New right pigtail chest tube noted over the right upper chest. Stable right lower pigtail chest tube. Small right base pneumothorax again noted. Persistent bibasilar infiltrates. Small bilateral pleural effusions. Stable cardiomegaly. IMPRESSION: 1. New right pigtail chest tube noted over the right upper chest. Stable right lower pigtail chest tube. Small right base pneumothorax again noted. 2. Persistent bibasilar infiltrates without interim change. Small bilateral pleural effusions again noted. 3.  Stable cardiomegaly. Electronically Signed   By: Marcello Moores  Register   On: 01/03/2021 05:48   DG Chest Port 1 View  Result Date: 01/02/2021 CLINICAL DATA:  Empyema. EXAM: PORTABLE CHEST 1 VIEW COMPARISON:  CT 01/01/2021.  Chest x-ray 01/01/2021. FINDINGS: Right chest tube in stable position. Small right pleural effusion with a small amount of pleural air again noted. Persistent right base atelectasis. Persistent left base atelectasis/infiltrate small left pleural effusion. Cardiomegaly. No pulmonary venous congestion. IMPRESSION: 1. Right chest tube in stable position. Small right pleural effusion with a small amount of pleural air again noted. Persistent right base atelectasis. 2. Persistent  left base atelectasis/infiltrate and small left pleural effusion. Electronically Signed   By: Marcello Moores  Register   On: 01/02/2021 06:52   DG CHEST PORT 1 VIEW  Result Date: 01/01/2021 CLINICAL DATA:  Empyema. EXAM: PORTABLE CHEST 1 VIEW COMPARISON:  12/30/2020 and CT chest 12/25/2020. FINDINGS: Patient is slightly rotated. Trachea is midline. Heart size stable. Small bore pigtail catheter is seen in the lower right hemithorax with a small right pleural effusion and right basilar atelectasis. No definite pneumothorax. Left lower lobe collapse/consolidation with a small to moderate left pleural effusion. IMPRESSION: 1. Small right pleural effusion with right basilar pulmonary parenchymal volume loss and chest tube in place. No definite pneumothorax. 2. Left lower lobe collapse/consolidation with a small to moderate left pleural effusion. Electronically Signed   By: Lorin Picket M.D.   On: 01/01/2021 12:33   CT IMAGE GUIDED DRAINAGE BY PERCUTANEOUS CATHETER  Result Date: 01/02/2021 INDICATION: Right-sided empyema and status post CT-guided percutaneous drainage catheter placement on 12/26/2020. Follow-up CT demonstrates improvement but some persistent loculated fluid in the superior and lateral hemithorax. Request made to try to drain this fluid as the patient is still requiring pressor support. EXAM: CT-GUIDED RIGHT-SIDED THORACOSTOMY TUBE PLACEMENT MEDICATIONS: The patient is currently admitted to the hospital and receiving intravenous antibiotics. The antibiotics were administered within an appropriate time frame prior to the initiation of the procedure. ANESTHESIA/SEDATION: No conscious sedation was administered due to tenuous blood pressure. COMPLICATIONS: Small pneumothorax. SIR LEVEL B - Normal therapy, includes overnight admission for observation. PROCEDURE: Informed written consent was obtained from the patient after a thorough discussion of the procedural risks, benefits and alternatives. All questions  were addressed. Maximal Sterile Barrier Technique was utilized including caps, mask, sterile gowns, sterile gloves, sterile drape, hand hygiene and skin antiseptic. A timeout was performed prior to the initiation of the procedure. Initial CT was performed in a decubitus position with the right side up. The right-sided was then rolled down slightly and additional localizing CT performed. The skin was prepped with chlorhexidine. Local anesthesia was provided with 1% lidocaine. Under CT guidance, an 18 gauge trocar needle was advanced into the posterolateral right pleural space.  A guidewire was then advanced via the needle and the needle removed. The percutaneous tract was dilated and a 12 French pigtail drainage catheter advanced into the right lateral pleural space. Catheter position was confirmed by CT. The catheter was then secured at the skin with a Prolene retention suture, StatLock device and dressing. FINDINGS: Initial imaging demonstrates a small area of loculated lentiform shaped fluid in the posterolateral right upper hemithorax. This was targeted for chest tube placement and during needle advancement, the fluid gradually diminished and dissipated consistent with likely a free-flowing component posteriorly and along the course of the major fissure. As the needle was advanced into the pleural space, there was no fluid return and imaging demonstrated a lateral pneumothorax. A guidewire was able to be advanced into the pleural space allowing placement of a drainage catheter. After drain placement, aspiration yielded only air and no fluid return. The drain was connected to a [place] Pleur-evac device and will be connected to wall suction at -20 cm of water. IMPRESSION: During planned percutaneous drainage catheter placement into what appear to be a loculated residual component of empyema in the superior right posterolateral pleural space, the fluid appeared to dissipate as a trocar needle was advanced and  resulted in development of a pneumothorax. This was treated by placement of a 12 French pigtail catheter into the lateral pleural space which will be connected to a Pleur-evac device and wall suction. The fluid component clearly dissipated away consistent with a free-flowing component in the pleural space which was likely promoted when the patient was in a decubitus position. Electronically Signed   By: Aletta Edouard M.D.   On: 01/02/2021 17:19    Cardiac Studies   Echo 12/30/20: 1. Left ventricular ejection fraction, by estimation, is 40 to 45%.  Difficult to determine systolic function as poor image quality and patient  is in Afib with RVR, but appears left ventricle has mildly decreased  function. Would consider repeat limited  echo with contrast once heart rate better controlled. Left ventricular  endocardial border not optimally defined to evaluate regional wall motion.  There is mild left ventricular hypertrophy. Left ventricular diastolic  parameters are indeterminate.  2. Right ventricular systolic function is mildly reduced. The right  ventricular size is mildly enlarged. There is normal pulmonary artery  systolic pressure. The estimated right ventricular systolic pressure is  [id number] mmHg.  3. Left atrial size was moderately dilated.  4. The mitral valve is degenerative. No evidence of mitral valve  regurgitation. Moderate mitral annular calcification.  5. The aortic valve was not well visualized. Aortic valve regurgitation  is trivial. No aortic stenosis is present.  6. Aortic dilatation noted. There is mild dilatation of the aortic root,  measuring 42 mm. There is mild dilatation of the ascending aorta,  measuring 41 mm.  7. The inferior vena cava is dilated in size with <50% respiratory  variability, suggesting right atrial pressure of 15 mmHg.   Patient Profile     87 y.o. 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 Hospitalization has been complicated by atrial fibrillation with RVR and hypotension.    Assessment & Plan    # Acute systolic and diastolic heart failure:  # Mixed cardiogenic/septic shock:  LVEF 40 to 45% this admission, down from 65 to 70% previously. He is now off  of phenylephrine. Blood pressure is pretty stable on midodrine. Continue to wean as able. Mixed cardiogenic and septic shock improving. It seems that after his procedure yesterday his blood pressure has been improving. He was net -680 mL yesterday. Renal function stable.  He also received stress dose steroids.   # Persistent atrial fibrillation: Rates are better controlled. Amiodarone was discontinued given that he remains in atrial fibrillation. Rates in pretty stable on digoxin. Digoxin level today was low. We can titrate the dose as needed for improved rate control.  Transition from heparin to his home Eliquis once no where procedures are required. His home diltiazem has been held. As blood pressure tolerates, would prefer to wean him from the midodrine and add metoprolol.  # Recent COVID-19 infection:  # HAP, R-sided loculated pleural effusion:  Chest tube in place and he had an anterior tube placed 2/1.  He has intrapleural fibrinolytic therapy.  BP improving.  Per critical care.         For questions or updates, please contact [place] Please consult [email or web address] for contact info under        Signed, Skeet Latch, MD  01/03/2021, 11:31 AM

## 2021-01-04 ENCOUNTER — Inpatient Hospital Stay (HOSPITAL_COMMUNITY): Payer: Medicare PPO

## 2021-01-04 DIAGNOSIS — J9601 Acute respiratory failure with hypoxia: Secondary | ICD-10-CM | POA: Diagnosis present

## 2021-01-04 DIAGNOSIS — J869 Pyothorax without fistula: Secondary | ICD-10-CM | POA: Diagnosis not present

## 2021-01-04 DIAGNOSIS — G9341 Metabolic encephalopathy: Secondary | ICD-10-CM | POA: Diagnosis not present

## 2021-01-04 DIAGNOSIS — I4891 Unspecified atrial fibrillation: Secondary | ICD-10-CM | POA: Diagnosis not present

## 2021-01-04 DIAGNOSIS — G8929 Other chronic pain: Secondary | ICD-10-CM | POA: Diagnosis not present

## 2021-01-04 DIAGNOSIS — J90 Pleural effusion, not elsewhere classified: Secondary | ICD-10-CM

## 2021-01-04 LAB — BASIC METABOLIC PANEL
Anion gap: 8 (ref 5–15)
BUN: 23 mg/dL (ref 8–23)
CO2: 24 mmol/L (ref 22–32)
Calcium: 7.8 mg/dL — ABNORMAL LOW (ref 8.9–10.3)
Chloride: 104 mmol/L (ref 98–111)
Creatinine, Ser: 0.79 mg/dL (ref 0.61–1.24)
GFR, Estimated: >60 mL/min (ref >60)
Glucose, Bld: 126 mg/dL — ABNORMAL HIGH (ref 70–99)
Potassium: 4.2 mmol/L (ref 3.5–5.1)
Sodium: 136 mmol/L (ref 135–145)

## 2021-01-04 LAB — CBC
HCT: 36.9 % — ABNORMAL LOW (ref 39.0–52.0)
Hemoglobin: 11.7 g/dL — ABNORMAL LOW (ref 13.0–17.0)
MCH: 27.5 pg (ref 26.0–34.0)
MCHC: 31.7 g/dL (ref 30.0–36.0)
MCV: 86.8 fL (ref 80.0–100.0)
Platelets: 367 10*3/uL (ref 150–400)
RBC: 4.25 MIL/uL (ref 4.22–5.81)
RDW: 14.2 % (ref 11.5–15.5)
WBC: 9.5 10*3/uL (ref 4.0–10.5)
nRBC: 0 % (ref 0.0–0.2)

## 2021-01-04 LAB — GLUCOSE, CAPILLARY
Glucose-Capillary: 117 mg/dL — ABNORMAL HIGH (ref 70–99)
Glucose-Capillary: 116 mg/dL — ABNORMAL HIGH (ref 70–99)
Glucose-Capillary: 122 mg/dL — ABNORMAL HIGH (ref 70–99)

## 2021-01-04 LAB — HEPARIN LEVEL (UNFRACTIONATED)
Heparin Unfractionated: 0.94 IU/mL — ABNORMAL HIGH (ref 0.30–0.70)
Heparin Unfractionated: 1.04 IU/mL — ABNORMAL HIGH (ref 0.30–0.70)

## 2021-01-04 LAB — PHOSPHORUS: Phosphorus: 2.6 mg/dL (ref 2.5–4.6)

## 2021-01-04 MED ORDER — FUROSEMIDE 10 MG/ML IJ SOLN
40.0000 mg | Freq: Once | INTRAMUSCULAR | Status: AC
  Administered 2021-01-04: 40 mg via INTRAVENOUS
  Filled 2021-01-04: qty 4

## 2021-01-04 MED ORDER — HYDROCORTISONE NA SUCCINATE PF 100 MG IJ SOLR
50.0000 mg | Freq: Three times a day (TID) | INTRAMUSCULAR | Status: DC
  Administered 2021-01-04 – 2021-01-05 (×3): 50 mg via INTRAVENOUS
  Filled 2021-01-04 (×3): qty 2

## 2021-01-04 MED ORDER — HEPARIN (PORCINE) 25000 UT/250ML-% IV SOLN: 1050.0000 [IU]/h | INTRAVENOUS | Status: DC

## 2021-01-04 MED ORDER — APIXABAN 2.5 MG PO TABS
2.5000 mg | ORAL_TABLET | Freq: Two times a day (BID) | ORAL | Status: DC
  Administered 2021-01-04 – 2021-01-10 (×13): 2.5 mg via ORAL
  Filled 2021-01-04 (×13): qty 1

## 2021-01-04 NOTE — Progress Notes (Signed)
[place] for apixaban Indication: atrial fibrillation  Allergies  Allergen Reactions  . Coconut Flavor [Flavoring Agent] Hypertension    Anything that is related to coconut---LOSS OF CONSCIOUSNESS (SYNCOPE)  . Coconut Oil Other (See Comments)    Anything that is related to coconut--- LOSS OF CONSCIOUSNESS (SYNCOPE)  . Doxycycline Hyclate Hives  . Food Anaphylaxis    ALLERGIC TO ALL NUTS WITH THE EXCEPTION OF CASHEWS, ALMONDS  . Black [employer]  Other (See Comments)    Passes out ( can only eat almonds or peanuts)  . Coconut Oil Other (See Comments)    Faints   . Doxycycline Rash    Patient Measurements: Height: 5\' 11"  (180.3 cm) Weight: 65.1 kg (143 lb 8.3 oz) IBW/kg (Calculated) : 75.3 Heparin Dosing Weight: 65.4 kg  Vital Signs: Temp: 97.7 F (36.5 C) (02/03 0715) Temp Source: Oral (02/03 0715) BP: 133/66 (02/03 0600) Pulse Rate: 76 (02/03 0600)  Labs: Recent Labs    01/02/21 0215 01/03/21 0252 01/04/21 0253 01/04/21 0402  HGB 11.0* 13.0 11.7*  --   HCT 35.9* 41.4 36.9*  --   PLT 449* 390 367  --   HEPARINUNFRC 0.47 0.61 0.94* 1.04*  CREATININE 0.70 0.82 0.79  --     Estimated Creatinine Clearance: 54.3 mL/min (by C-G formula based on SCr of 0.79 mg/dL).   Assessment: Pharmacy consulted to dose heparin for this patient who had previously been on apixaban for atrial fibrillation.  Last dose of apixaban was 12/25/2020 at 1122 and was held for thoracentesis and possible other procedures.  Age > 96 years and history of anemia, prostate cancer, GI bleeding, and blood transfusion.  2/3:  Per Dr. Carlis Abbott, [place] to change back to DOAC from heparin.  Expect to remove current chest tube and do not anticipate need for any further chest tube placement or other procedures.  01/04/2021 -Heparin level supra-therapeutic this morning, rate reduced to heparin 1050 units/hr.  -CBC stable -No bleeding or line issues per RN  Goal of Therapy:   Heparin level 0.3-0.7 units/ml aPTT 66-102 seconds Monitor platelets by anticoagulation protocol: Yes   Plan:  D/C Heparin gtt Resume home dose Apixaban 2.5 mg PO BID - reduced dose appropriate given age > 56 yrs and baseline weight < 60 kg Pharmacy to sign off heparin notes and follow peripherally.  Gretta Arab PharmD, BCPS Clinical Pharmacist [facility] main pharmacy [phone] 01/04/2021 9:53 AM

## 2021-01-04 NOTE — Progress Notes (Signed)
Chaplain engaged in initial visit with Casey Gates and his son, [name].  Chaplain and [name] discussed different options regarding nursing homes.  [name] has been concerned about the three options he was given for his dad who has Medicare.  Chaplain and [name] talked about him looking at reviews and ratings.  [name] has previously been at a nursing home before and [name] detailed the poor care that he received.  Chaplain affirmed how hard it can be in nursing home facilities and suggested that [name] continue to advocate on behalf on his father.    Chaplain offered presence, support, and listening.    01/04/21 1200  Clinical Encounter Type  Visited With Patient and family together  Visit Type Initial

## 2021-01-04 NOTE — Progress Notes (Signed)
[place] for heparin Indication: atrial fibrillation  Allergies  Allergen Reactions  . Coconut Flavor [Flavoring Agent] Hypertension    Anything that is related to coconut---LOSS OF CONSCIOUSNESS (SYNCOPE)  . Coconut Oil Other (See Comments)    Anything that is related to coconut--- LOSS OF CONSCIOUSNESS (SYNCOPE)  . Doxycycline Hyclate Hives  . Food Anaphylaxis    ALLERGIC TO ALL NUTS WITH THE EXCEPTION OF CASHEWS, ALMONDS  . Black [employer]  Other (See Comments)    Passes out ( can only eat almonds or peanuts)  . Coconut Oil Other (See Comments)    Faints   . Doxycycline Rash    Patient Measurements: Height: 5\' 11"  (180.3 cm) Weight: 65.1 kg (143 lb 8.3 oz) IBW/kg (Calculated) : 75.3 Heparin Dosing Weight: 65.4 kg  Vital Signs: Temp: 97.9 F (36.6 C) (02/02 2311) Temp Source: Axillary (02/02 2311) BP: 120/61 (02/03 0000) Pulse Rate: 95 (02/03 0000)  Labs: Recent Labs    01/02/21 0215 01/03/21 0252 01/04/21 0253 01/04/21 0402  HGB 11.0* 13.0 11.7*  --   HCT 35.9* 41.4 36.9*  --   PLT 449* 390 367  --   HEPARINUNFRC 0.47 0.61 0.94* 1.04*  CREATININE 0.70 0.82 0.79  --     Estimated Creatinine Clearance: 54.3 mL/min (by C-G formula based on SCr of 0.79 mg/dL).   Assessment: Pharmacy consulted to dose heparin for this patient who had previously been on apixaban for atrial fibrillation.  Last dose of apixaban was 12/25/2020 at 1122 and was held for thoracentesis and possible other procedures.  Age > 6 years and history of anemia, prostate cancer, GI bleeding, and blood transfusion. Baseline heparin level 0.19 and aPTT 33 which correlate.  01/04/2021 -Heparin level 0.94 (supratherapeutic) with heparin gtt @ 1200 units/hr. Patient's heparin level has been therapeutic since 1/30 on same rate, so reordered heparin level to confirm accuracy. -Repeat heparin level = 1.04 with heparin gtt @ 1200 units/hr -CBC stable -No bleeding  or line issues per RN  Goal of Therapy:  Heparin level 0.3-0.7 units/ml aPTT 66-102 seconds Monitor platelets by anticoagulation protocol: Yes   Plan:  Hold heparin x 1 hr then restart heparin gtt @ 1050 units/hr Check heparin level 8 hr after heparin rate reduced Monitor daily heparin level & daily CBC   Leone Haven, PharmD 01/04/2021 4:56 AM

## 2021-01-04 NOTE — Progress Notes (Signed)
PROGRESS NOTE    Casey Gates.  JU:[id number] DOB: 30-Dec-1925 DOA: 12/17/2020 PCP: Velna Hatchet, MD    Chief Complaint  Patient presents with  . Urinary Tract Infection    Brief Narrative:  Patient 87 year old gentleman initially admitted 12/17/2020 with pneumonia and associated right-sided loculated pleural effusion.  Patient completed full course of antibiotics however continued to have worsening loculated right pleural effusion with evolution of air/gas fluid level.  Right chest tube placed and patient completed intrapleural fibrinolytic therapy. Patient subsequently noted to go into septic shock with concerns for mild adrenal insufficiency and transferred to the ICU and subsequently placed on pressors as well as stress dose steroids.  Patient had a subsequent second chest tube placed by IR in the right lateral superior loculated pleural fluid.  Patient also on midodrine.  Patient also noted to go into A. fib with RVR cardiology consulted patient initially on amiodarone due to shock and subsequently transition to digoxin.  Cardiology following. Pressors weaned off.  Patient subsequently transferred to [facility].  PCCM following for chest tube management.   Assessment & Plan:   Principal Problem:   Acute respiratory failure with hypoxia (HCC) Active Problems:   Multifocal pneumonia   Chronic pain   Urinary retention   Septic shock (HCC)   Prostate cancer (HCC)   Atrial fibrillation with RVR (HCC)   Acute metabolic encephalopathy   Hypernatremia   Empyema (HCC)   DNR (do not resuscitate)   Goals of care, counseling/discussion   Shock circulatory ([name])   #1 acute hypoxemic respiratory failure secondary to pneumonia with empyema/OSA Patient with clinical improvement.  Currently on room air.  Status post chest tube placement x2.  Continue pulmonary hygiene, mobilization.  Continue Pulmicort.  PT/OT following.  Per PCCM.  2.  Right empyema secondary to PNA Concern  that patient's right empyema may be in the setting of aspiration. Status post chest tube placement per IR 12/26/2020 with completion of fibrinolytics.  Patient with apical chest tube placed to 12/21/2020 for residual loculated empyema.  CT chest ordered for PCCM and pending. Chest tube management per PCCM.  Continue empiric IV Merrem and Zyvox for now.  Follow.  3.  Septic shock/adrenal insufficiency Patient was transferred to the ICU 12/28/2018.  Has noted to be in septic shock requiring pressors.  Patient also placed on stress dose steroids.  Cortisol level noted at 16.1.  Patient currently on midodrine.  Decrease IV Solu-Cortef to 50 mg every 8 hours.  Continue empiric IV antibiotics.  Follow.  4.  Persistent atrial fibrillation Patient initially was on amiodarone for management of A. fib as patient also noted to be in septic shock.  Amiodarone discontinued and patient placed on digoxin for better rate control.  Heart rate improved.  Blood pressure improved on current regimen of midodrine and IV Solu-Cortef. Diltiazem currently on hold.  Continue to wean IV Solu-Cortef.  Also cardiology preferring to wean patient off midodrine to add metoprolol.  On IV heparin for anticoagulation and being transitioned to Eliquis as per PCCM no further indications for procedures at this time.  Cardiology following and appreciate input and recommendations.  5.  Acute systolic and diastolic heart failure/mixed cardiogenic shock 2D echo done with EF of 40 to 45% down from 65 to 70%.  Patient required pressors early on during the hospitalization which have subsequently been weaned off.  Blood pressure currently stable on midodrine.  Patient also on stress dose steroids which are being weaned down.  Continue digoxin.  Per cardiology.  6.  Acute metabolic encephalopathy Secondary to sepsis.  Resolved.  Follow.  7.  Hypophosphatemia Repleted.  Phosphorus at 2.6..  8.  Chronic pain Stable.  Patient noted to be on Opana  and Neurontin as needed prior to admission.  Continue oxycodone 5 mg p.o. every 6 hours as needed pain.  9.  Moderate protein calorie malnutrition Continue current dysphagia 3 diet.  Follow.  10.  History of prostate cancer/urinary retention Foley catheter replaced on 01/03/2021.  Outpatient follow-up with urology.  11.  Anemia Patient with no overt bleeding.  Hemoglobin currently stable at 11.7.  Monitor closely with anticoagulation.    DVT prophylaxis: Heparin>>>> Eliquis Code Status: DNR Family Communication: Updated patient and son at bedside Disposition:   Status is: Inpatient    Dispo: The patient is from: ALF              Anticipated d/c is to: SNF              Anticipated d/c date is: To be determined.              Patient currently on midodrine, IV stress dose steroids, antibiotics, chest tubes x2, not stable for discharge.   Difficult to place patient undetermined       Consultants:   PCCM: Dr. Chase Caller 12/25/2020  Cardiology: Dr. Johney Frame 12/19/2020  Significant Hospital Events:  1/16 Admit, started on broad spectrum abx 1/17 Thoracentesis with exudative pleural effusion, 230ml hazy amber fluid removed 1/18 [facility], Cardiology consulted 1/19 Completed azithromycin  1/23 Completed ceftriaxone  1/24 PCCM consulted  1/26 R chest tube with intraplural tPA / dornase instilled  1/28 Completed 3rd dose intrapleural fibrinolytic therapy  1/29 [place] O2, afebrile with normal mental status. Labile rhythm / BP on low dose neosynephrine   1/30 [place] O2, cortisol 16.1, on stress dose steroids on neo. On dig/amio for AF.  2/01 Anterior chest tube placed per IR into R lateral superior loculated pleural fluid  2/03 Apical pocket of fluid cleared on CXR, apical chest tube discontinued     Procedures:   CT Chest 1/24 >> moderate to large loculated right pleural effusion is increased in volume when compared to 1/16.  New focus of gas with air-fluid level in the posterior  compartment of the effusion.  Progressive atelectasis of the entire right lower lobe with partial atelectasis of the right middle lobe and right upper lobe.  Progressive consolidation involving the posterior medial left lower lobe containing air bronchograms.  CT Chest w/o 2/3 >>   R lateral chest tube 1/25 per IR>>>  Right anterior chest tube 2/1 >>>    Antimicrobials:  Azithro 1/16 >> 1/20  Ceftriaxone 1/16 >> 1/23  Cefepime 1/24 >> 1/27  Vanco 1/24 >> 1/31 Meropenem 1/29 >>  Zyvox 1/31>>  Micro Data:  COVID 1/16 >> negative  BCx2 1/16 >> negative  Pleural fluid 1/17 >> negative  MRSA PCR 1/18 >> positive  Chest tube aspirate 1/25 >> negative  BCx2 1/27 >> negative  UC 1/27 >> negative     Subjective: Patient sitting up in bed.  Denies any significant shortness of breath.  Denies any chest pain.  No abdominal pain.  Feeling better.  On room air.  Son at bedside.  Objective: Vitals:   01/04/21 0400 01/04/21 0600 01/04/21 0715 01/04/21 1115  BP: (!) 129/58 133/66    Pulse: 77 76    Resp: 18 15    Temp: 97.8 F (36.6 C)  97.7  F (36.5 C) 97.8 F (36.6 C)  TempSrc: Axillary  Oral Oral  SpO2: 96% 98%    Weight:      Height:        Intake/Output Summary (Last 24 hours) at 01/04/2021 1553 Last data filed at 01/04/2021 1238 Gross per 24 hour  Intake 1223.12 ml  Output 1670 ml  Net -446.88 ml   Filed Weights   12/26/20 1112 12/29/20 0615 12/31/20 0630  Weight: 62.7 kg 65.4 kg 65.1 kg    Examination:  General exam: NAD Respiratory system: Decreased breath sounds in the bases.  Right chest tubes x2.   Cardiovascular system: Irregularly irregular.  No JVD.  No lower extremity edema. Gastrointestinal system: Abdomen is soft, nontender, nondistended, positive bowel sounds.  No rebound.  No guarding. Central nervous system: Alert and oriented. No focal neurological deficits. Extremities: Symmetric 5 x 5 power. Skin: No rashes, lesions or ulcers Psychiatry:  Judgement and insight appear normal. Mood & affect appropriate.     Data Reviewed: I have personally reviewed following labs and imaging studies  CBC: Recent Labs  Lab 12/30/20 0219 12/31/20 0229 01/02/21 0215 01/03/21 0252 01/04/21 0253  WBC 15.5* 18.0* 10.3 10.5 9.5  HGB 10.9* 12.2* 11.0* 13.0 11.7*  HCT 35.2* 39.2 35.9* 41.4 36.9*  MCV 89.8 90.7 90.0 88.7 86.8  PLT 434* 461* 449* 390 [id number]    Basic Metabolic Panel: Recent Labs  Lab 12/28/20 1720 12/29/20 0248 12/30/20 0219 12/31/20 0229 01/01/21 0226 01/02/21 0215 01/03/21 0252 01/04/21 0253  NA 140   < > 139 137  --  137 137 136  K 3.8   < > 3.6 4.1  --  4.3 4.6 4.2  CL 108   < > 110 107  --  107 105 104  CO2 27   < > 23 21*  --  23 25 24   GLUCOSE 116*   < > 91 134*  --  115* 109* 126*  BUN 17   < > 18 19  --  21 22 23   CREATININE 0.91   < > 0.70 0.84  --  0.70 0.82 0.79  CALCIUM 7.5*   < > 7.6* 7.7*  --  7.8* 8.0* 7.8*  MG 2.2  --  2.0 2.2  --   --  2.1  --   PHOS 3.3   < > 2.5 2.4* 2.9 2.8 2.9 2.6   < > = values in this interval not displayed.    GFR: Estimated Creatinine Clearance: 54.3 mL/min (by C-G formula based on SCr of 0.79 mg/dL).  Liver Function Tests: Recent Labs  Lab 12/28/20 1720 12/30/20 0219 12/31/20 0229  AST 35 35 53*  ALT 31 27 40  ALKPHOS 81 68 72  BILITOT 0.7 0.4 0.2*  PROT 4.9* 4.5* 4.8*  ALBUMIN 1.7* 1.5* 1.7*    CBG: Recent Labs  Lab 01/03/21 1135 01/03/21 1643 01/03/21 2138 01/04/21 0721 01/04/21 1124  GLUCAP 101* 118* 153* 117* 116*     Recent Results (from the past 240 hour(s))  Aerobic/Anaerobic Culture (surgical/deep wound)     Status: None   Collection Time: 12/26/20  4:42 PM   Specimen: Abscess  Result Value Ref Range Status   Specimen Description   Final    ABSCESS POST CT GUIDED RIGHT CHEST TUBE PLACEMENT Performed at [facility] [address] [place], [place] [address]    Special Requests   Final    NONE Performed at [facility], [address] [place], [place] [address]    Gram Stain NO WBC SEEN NO ORGANISMS SEEN   Final   Culture   Final    No growth aerobically or anaerobically. Performed at [facility], [place] [address], [place], [place] [address]    Report Status 12/31/2020 FINAL  Final  Culture, blood (Routine X 2) w Reflex to ID Panel     Status: None   Collection Time: 12/28/20  9:17 AM   Specimen: BLOOD  Result Value Ref Range Status   Specimen Description   Final    BLOOD BLOOD RIGHT FOREARM Performed at [facility] [address], [place], [place] [address]    Special Requests   Final    BOTTLES DRAWN AEROBIC ONLY Blood Culture adequate volume Performed at [facility] [address] [place], [place] [address]    Culture   Final    NO GROWTH 5 DAYS Performed at [facility], [place] [address], [place], [place] [address]    Report Status 01/02/2021 FINAL  Final  Culture, blood (Routine X 2) w Reflex to ID Panel     Status: None   Collection Time: 12/28/20  9:19 AM   Specimen: BLOOD RIGHT HAND  Result Value Ref Range Status   Specimen Description   Final    BLOOD RIGHT HAND Performed at [facility] [address] [place], [place] [address]    Special Requests   Final    BOTTLES DRAWN AEROBIC ONLY Blood Culture adequate volume Performed at [facility] [address] [place], [place] [address]    Culture   Final    NO GROWTH 5 DAYS Performed at [facility], [place] [address] [place], [place] [address]    Report Status 01/02/2021 FINAL  Final  Culture, Urine     Status: None   Collection Time: 12/28/20  9:34 AM   Specimen: Urine, Catheterized  Result Value Ref Range Status   Specimen Description   Final    URINE, CATHETERIZED Performed at [facility] [address] [place], [place] [address]    Special Requests   Final    NONE Performed  at [facility], [place] [address] [place], [place] [address]    Culture   Final    NO GROWTH Performed at [facility], [place] [address] [place], [place] [address]    Report Status 12/29/2020 FINAL  Final  Culture, blood (Routine X 2) w Reflex to ID Panel     Status: None   Collection Time: 12/28/20  5:20 PM   Specimen: BLOOD LEFT HAND  Result Value Ref Range Status   Specimen Description   Final    BLOOD LEFT HAND Performed at [facility] [address], [place], [place] [address]    Special Requests   Final    BOTTLES DRAWN AEROBIC ONLY Blood Culture adequate volume Performed at [facility] [address] [place] [address], [place], [place] [address]    Culture   Final    NO GROWTH 5 DAYS Performed at [facility], [place] [address], [place],  [address]    Report Status 01/03/2021 FINAL  Final  Culture, blood (Routine X 2) w Reflex to ID Panel     Status: None   Collection Time: 12/28/20  5:20 PM   Specimen: BLOOD  Result Value Ref Range Status   Specimen Description  Final    BLOOD RIGHT ANTECUBITAL Performed at [facility] [address] [place], [place] [address]    Special Requests   Final    BOTTLES DRAWN AEROBIC ONLY Blood Culture adequate volume Performed at [facility] [address], [place], [place] [address]    Culture   Final    NO GROWTH 5 DAYS Performed at [facility], [place] [address] [place], [place] [address]    Report Status 01/03/2021 FINAL  Final         Radiology Studies: DG Chest Port 1 View  Result Date: 01/04/2021 CLINICAL DATA:  Empyema.  Chest tube. EXAM: PORTABLE CHEST 1 VIEW COMPARISON:  01/03/2021.  CT 01/02/2021. FINDINGS: Two right chest tubes in stable position. Minimal residual right base pneumothorax cannot be excluded. Persistent bibasilar atelectasis and infiltrates. Small bilateral pleural effusions with interim progression  on the left. Stable cardiomegaly. IMPRESSION: 1. Two right chest tubes in stable position. Minimal residual right base pneumothorax cannot be excluded. 2. Persistent bibasilar atelectasis and infiltrates. Small bilateral pleural effusions with interim progression on the left. Electronically Signed   By: Marcello Moores  Register   On: 01/04/2021 06:44   DG Chest Port 1 View  Result Date: 01/03/2021 CLINICAL DATA:  Pleural effusion. EXAM: PORTABLE CHEST 1 VIEW COMPARISON:  CT 01/02/2021, 01/01/2021.  Chest x-ray 01/02/2021. FINDINGS: New right pigtail chest tube noted over the right upper chest. Stable right lower pigtail chest tube. Small right base pneumothorax again noted. Persistent bibasilar infiltrates. Small bilateral pleural effusions. Stable cardiomegaly. IMPRESSION: 1. New right pigtail chest tube noted over the right upper chest. Stable right lower pigtail chest tube. Small right base pneumothorax again noted. 2. Persistent bibasilar infiltrates without interim change. Small bilateral pleural effusions again noted. 3.  Stable cardiomegaly. Electronically Signed   By: Marcello Moores  Register   On: 01/03/2021 05:48        Scheduled Meds: . apixaban  2.5 mg Oral BID  . artificial tears  1 application Left Eye TID  . budesonide (PULMICORT) nebulizer solution  0.5 mg Nebulization BID  . chlorhexidine  15 mL Mouth Rinse BID  . Chlorhexidine Gluconate Cloth  6 each Topical Daily  . digoxin  0.0625 mg Oral Daily  . feeding supplement  237 mL Oral BID BM  . hydrocortisone sod succinate (SOLU-CORTEF) inj  50 mg Intravenous Q8H  . insulin aspart  0-15 Units Subcutaneous TID WC  . linezolid  600 mg Oral Q12H  . mouth rinse  15 mL Mouth Rinse q12n4p  . midodrine  5 mg Oral Q8H  . sodium chloride flush  10 mL Intracatheter Q8H   Continuous Infusions: . sodium chloride Stopped (01/02/21 1136)  . meropenem (MERREM) IV Stopped (01/04/21 0533)     LOS: 18 days    Time spent: 40 minutes    Irine Seal, MD [facility]   To contact the attending provider between 7A-7P or the covering provider during after hours 7P-7A, please log into the web site [email or web address] and access using universal  password for that web site. If you do not have the password, please call the hospital operator.  01/04/2021, 3:53 PM

## 2021-01-04 NOTE — Progress Notes (Signed)
Referring Physician(s): Babcock, Peter E (CCM); Clark, Laura P (CCM)  Supervising Physician: Watts, John  Patient Status:  [facility] - In-pt  Chief Complaint: Chest tube follow-up  Subjective:  History of loculated right pleural effusion concerning for empyema s/p right chest tube placement in IR 12/26/2020; with persistent loculated right pleural effusion s/p additional right chest tube placed in IR 01/02/2021. Patient awake and alert sitting in bed talking to son with no complaints at this time. Son and RN at bedside. On RA. Right chest tube sites c/d/i.  CXR this AM: 1. Two right chest tubes in stable position. Minimal residual right base pneumothorax cannot be excluded. 2. Persistent bibasilar atelectasis and infiltrates. Small bilateral pleural effusions with interim progression on the left.   Allergies: Coconut flavor [flavoring agent], Coconut oil, Doxycycline hyclate, Food, Black walnut flavor, Coconut oil, and Doxycycline  Medications: Prior to Admission medications   Medication Sig Start Date End Date Taking? Authorizing Provider  apixaban (ELIQUIS) 2.5 MG TABS tablet Take 1 tablet (2.5 mg total) by mouth 2 (two) times daily. 07/24/20  Yes Carroll, Donna C, NP  diltiazem (CARDIZEM SR) 120 MG 12 hr capsule Take 1 capsule (120 mg total) by mouth 2 (two) times daily. 07/24/20 08/24/20 Yes Carroll, Donna C, NP  gabapentin (NEURONTIN) 300 MG capsule Take 300 mg by mouth 3 (three) times daily as needed (legs-per MAR). Restless leg 05/14/20  Yes [provider]  mirtazapine (REMERON) 30 MG tablet Take 30 mg by mouth at bedtime. 11/21/20  Yes [provider]  MOVANTIK 25 MG TABS tablet Take 25 mg by mouth daily. 09/10/19  Yes [provider]  oxymorphone (OPANA) 10 MG tablet Take 10 mg by mouth every 6 (six) hours as needed for pain.  07/03/18  Yes [provider]  White Petrolatum-Mineral Oil (ARTIFICIAL TEARS) ointment Place 1 application into the left  eye 3 (three) times daily. Left lower eye lid   Yes [provider]     Vital Signs: BP 133/66   Pulse 76   Temp 97.7 F (36.5 C) (Oral)   Resp 15   Ht 5\' 11"  (1.803 m)   Wt 143 lb 8.3 oz (65.1 kg)   SpO2 98%   BMI 20.02 kg/m   Physical Exam Vitals and nursing note reviewed.  Constitutional:      General: He is not in acute distress. Pulmonary:     Effort: Pulmonary effort is normal. No respiratory distress.     Comments: On RA. Right chest tube sites without tenderness, erythema, drainage, or active bleeding. Right chest tube #1 with approximately 500 cc serosanguinous fluid in pleure-vac; tube to water seal with (+) air leak. Right chest tube #2 with approximately 270 cc serosanguinous fluid in pleure-vac; tube to water seal with (-) air leak. Skin:    General: Skin is warm and dry.  Neurological:     Mental Status: He is alert.     Imaging: CT CHEST W CONTRAST  Result Date: 01/01/2021 CLINICAL DATA:  Empyema. EXAM: CT CHEST WITH CONTRAST TECHNIQUE: Multidetector CT imaging of the chest was performed during intravenous contrast administration. CONTRAST:  51mMiracSilver [facility] Medical Corporation [facility] [id number] [id number] Surgery Centers0001110V6V[facility] Health [facility] Healthce0001110V25mr[facility]0001110V86m[facility]0001110V46mrGraWheatland Memorial Health0001110V62[facility] [id number] Fnd [facility]osp [facility] Ca0001110V64mrLaPerkins County Health Serv0001110VNor[facility]0001110V26[facility]0001110V6[facility] [id number] Baywood Medical Ce0001110V33mrHilSouth Plains Endoscopy Ce0001110V44mG [facility]0001110Versie StarksE IOHEXOL 300 MG/ML  SOLN COMPARISON:  December 25, 2020. FINDINGS: Cardiovascular: Atherosclerosis of thoracic aorta is noted without dissection. 4.1 cm ascending thoracic aortic aneurysm is noted. Coronary artery calcifications are noted. Mild cardiomegaly is noted without pericardial effusion. Mediastinum/Nodes: No enlarged mediastinal, hilar, or axillary lymph nodes. Thyroid gland, trachea, and esophagus demonstrate no significant findings. Lungs/Pleura:  Interval placement of pigtail chest tube into right pleural effusion noted on prior exam. The effusion is significantly smaller, with smaller loculated component seen in the upper lobe posteriorly. Small amount of air is noted within its posterior portion. Right middle and lower lobe opacities are noted concerning for pneumonia. Mild left pleural  effusion is noted with associated atelectasis or infiltrate of the left lower lobe. Upper Abdomen: No acute abnormality. Musculoskeletal: No chest wall abnormality. No acute or significant osseous findings. IMPRESSION: 1. Interval placement of pigtail chest tube into right pleural effusion noted on prior exam. The effusion is significantly smaller, with smaller loculated component seen in the upper lobe posteriorly. Small amount of air is noted within its posterior portion. 2. Right middle and lower lobe opacities are noted concerning for pneumonia. 3. Mild left pleural effusion is noted with associated atelectasis or infiltrate of the left lower lobe. 4. Coronary artery calcifications are noted suggesting coronary artery disease. 5. 4.1 cm ascending thoracic aortic aneurysm. Recommend annual imaging followup by CTA or MRA. This recommendation follows 2010 ACCF/AHA/AATS/ACR/ASA/SCA/SCAI/SIR/STS/SVM Guidelines for the Diagnosis and Management of Patients with Thoracic Aortic Disease. Circulation. 2010; [id number]. Aortic aneurysm NOS (ICD10-I71.9). 6. Aortic atherosclerosis. Aortic Atherosclerosis (ICD10-I70.0). Electronically Signed   By: Marijo Conception M.D.   On: 01/01/2021 16:45   DG Chest Port 1 View  Result Date: 01/04/2021 CLINICAL DATA:  Empyema.  Chest tube. EXAM: PORTABLE CHEST 1 VIEW COMPARISON:  01/03/2021.  CT 01/02/2021. FINDINGS: Two right chest tubes in stable position. Minimal residual right base pneumothorax cannot be excluded. Persistent bibasilar atelectasis and infiltrates. Small bilateral pleural effusions with interim progression on the left. Stable cardiomegaly. IMPRESSION: 1. Two right chest tubes in stable position. Minimal residual right base pneumothorax cannot be excluded. 2. Persistent bibasilar atelectasis and infiltrates. Small bilateral pleural effusions with interim progression on the left. Electronically Signed   By: Marcello Moores  Register   On: 01/04/2021 06:44   DG Chest Port 1  View  Result Date: 01/03/2021 CLINICAL DATA:  Pleural effusion. EXAM: PORTABLE CHEST 1 VIEW COMPARISON:  CT 01/02/2021, 01/01/2021.  Chest x-ray 01/02/2021. FINDINGS: New right pigtail chest tube noted over the right upper chest. Stable right lower pigtail chest tube. Small right base pneumothorax again noted. Persistent bibasilar infiltrates. Small bilateral pleural effusions. Stable cardiomegaly. IMPRESSION: 1. New right pigtail chest tube noted over the right upper chest. Stable right lower pigtail chest tube. Small right base pneumothorax again noted. 2. Persistent bibasilar infiltrates without interim change. Small bilateral pleural effusions again noted. 3.  Stable cardiomegaly. Electronically Signed   By: Marcello Moores  Register   On: 01/03/2021 05:48   DG Chest Port 1 View  Result Date: 01/02/2021 CLINICAL DATA:  Empyema. EXAM: PORTABLE CHEST 1 VIEW COMPARISON:  CT 01/01/2021.  Chest x-ray 01/01/2021. FINDINGS: Right chest tube in stable position. Small right pleural effusion with a small amount of pleural air again noted. Persistent right base atelectasis. Persistent left base atelectasis/infiltrate small left pleural effusion. Cardiomegaly. No pulmonary venous congestion. IMPRESSION: 1. Right chest tube in stable position. Small right pleural effusion with a small amount of pleural air again noted. Persistent right base atelectasis. 2. Persistent left base atelectasis/infiltrate and small left pleural effusion. Electronically Signed   By: Marcello Moores  Register   On: 01/02/2021 06:52   DG CHEST PORT 1 VIEW  Result Date: 01/01/2021 CLINICAL DATA:  Empyema. EXAM: PORTABLE CHEST 1 VIEW COMPARISON:  12/30/2020 and CT chest 12/25/2020. FINDINGS: Patient is slightly rotated. Trachea is midline. Heart  size stable. Small bore pigtail catheter is seen in the lower right hemithorax with a small right pleural effusion and right basilar atelectasis. No definite pneumothorax. Left lower lobe collapse/consolidation with a  small to moderate left pleural effusion. IMPRESSION: 1. Small right pleural effusion with right basilar pulmonary parenchymal volume loss and chest tube in place. No definite pneumothorax. 2. Left lower lobe collapse/consolidation with a small to moderate left pleural effusion. Electronically Signed   By: Lorin Picket M.D.   On: 01/01/2021 12:33   CT IMAGE GUIDED DRAINAGE BY PERCUTANEOUS CATHETER  Result Date: 01/02/2021 INDICATION: Right-sided empyema and status post CT-guided percutaneous drainage catheter placement on 12/26/2020. Follow-up CT demonstrates improvement but some persistent loculated fluid in the superior and lateral hemithorax. Request made to try to drain this fluid as the patient is still requiring pressor support. EXAM: CT-GUIDED RIGHT-SIDED THORACOSTOMY TUBE PLACEMENT MEDICATIONS: The patient is currently admitted to the hospital and receiving intravenous antibiotics. The antibiotics were administered within an appropriate time frame prior to the initiation of the procedure. ANESTHESIA/SEDATION: No conscious sedation was administered due to tenuous blood pressure. COMPLICATIONS: Small pneumothorax. SIR LEVEL B - Normal therapy, includes overnight admission for observation. PROCEDURE: Informed written consent was obtained from the patient after a thorough discussion of the procedural risks, benefits and alternatives. All questions were addressed. Maximal Sterile Barrier Technique was utilized including caps, mask, sterile gowns, sterile gloves, sterile drape, hand hygiene and skin antiseptic. A timeout was performed prior to the initiation of the procedure. Initial CT was performed in a decubitus position with the right side up. The right-sided was then rolled down slightly and additional localizing CT performed. The skin was prepped with chlorhexidine. Local anesthesia was provided with 1% lidocaine. Under CT guidance, an 18 gauge trocar needle was advanced into the posterolateral right  pleural space. A guidewire was then advanced via the needle and the needle removed. The percutaneous tract was dilated and a 12 French pigtail drainage catheter advanced into the right lateral pleural space. Catheter position was confirmed by CT. The catheter was then secured at the skin with a Prolene retention suture, StatLock device and dressing. FINDINGS: Initial imaging demonstrates a small area of loculated lentiform shaped fluid in the posterolateral right upper hemithorax. This was targeted for chest tube placement and during needle advancement, the fluid gradually diminished and dissipated consistent with likely a free-flowing component posteriorly and along the course of the major fissure. As the needle was advanced into the pleural space, there was no fluid return and imaging demonstrated a lateral pneumothorax. A guidewire was able to be advanced into the pleural space allowing placement of a drainage catheter. After drain placement, aspiration yielded only air and no fluid return. The drain was connected to a [place] Pleur-evac device and will be connected to wall suction at -20 cm of water. IMPRESSION: During planned percutaneous drainage catheter placement into what appear to be a loculated residual component of empyema in the superior right posterolateral pleural space, the fluid appeared to dissipate as a trocar needle was advanced and resulted in development of a pneumothorax. This was treated by placement of a 12 French pigtail catheter into the lateral pleural space which will be connected to a Pleur-evac device and wall suction. The fluid component clearly dissipated away consistent with a free-flowing component in the pleural space which was likely promoted when the patient was in a decubitus position. Electronically Signed   By: Aletta Edouard M.D.   On: 01/02/2021 17:19  Labs:  CBC: Recent Labs    12/31/20 0229 01/02/21 0215 01/03/21 0252 01/04/21 0253  WBC 18.0* 10.3 10.5 9.5   HGB 12.2* 11.0* 13.0 11.7*  HCT 39.2 35.9* 41.4 36.9*  PLT 461* 449* 390 367    COAGS: Recent Labs    09/01/20 0824 12/28/20 1720 12/30/20 1401  INR 1.2 1.4*  --   APTT  --   --  33    BMP: Recent Labs    06/26/20 0005 07/13/20 0944 07/27/20 1230 09/01/20 0824 10/13/20 0831 12/31/20 0229 01/02/21 0215 01/03/21 0252 01/04/21 0253  NA 141 142 139 143   < > 137 137 137 136  K 4.5 4.2 4.4 4.0   < > 4.1 4.3 4.6 4.2  CL 104 108 102 106   < > 107 107 105 104  CO2 28 28 27 29    < > 21* 23 25 24   GLUCOSE 112* 58* 102* 88   < > 134* 115* 109* 126*  BUN 21 19 22  24*   < > 19 21 22 23   CALCIUM 8.9 9.3 9.2 9.4   < > 7.7* 7.8* 8.0* 7.8*  CREATININE 0.97 1.05 1.08 0.98   < > 0.84 0.70 0.82 0.79  GFRNONAA >60 >60 60* >60   < > >60 >60 >60 >60  GFRAA >60 >60 >60 >60  --   --   --   --   --    < > = values in this interval not displayed.    LIVER FUNCTION TESTS: Recent Labs    12/28/20 0919 12/28/20 1720 12/30/20 0219 12/31/20 0229  BILITOT 0.4 0.7 0.4 0.2*  AST 38 35 35 53*  ALT 31 31 27  40  ALKPHOS 84 81 68 72  PROT 5.0* 4.9* 4.5* 4.8*  ALBUMIN 1.8* 1.7* 1.5* 1.7*    Assessment and Plan:  History of loculated right pleural effusion concerning for empyema s/p right chest tube placement in IR 12/26/2020; with persistent loculated right pleural effusion s/p additional right chest tube placed in IR 01/02/2021. Right chest tube #1 stable with approximately 500 cc serosanguinous fluid in pleure-vac; tube to suction with (+) air leak. Right chest tube #2 stable with approximately 270 cc serosanguinous fluid in pleure-vac; tube to suction with (-) air leak. Continue current chest tube management- tubes to suction, continue with serial CXR, further chest tube management per CCM. Further plans per [facility]/CCM/cardiology- appreciate and agree with management. IR will continue to follow peripherally- please call IR with questions/concerns.   Electronically Signed: Earley Abide,  PA-C 01/04/2021, 10:07 AM   I spent a total of 25 Minutes at the the patient's bedside AND on the patient's hospital floor or unit, greater than 50% of which was counseling/coordinating care for right empyema s/p right chest tube placement x2.

## 2021-01-04 NOTE — Progress Notes (Signed)
Progress Note  Patient Name: Casey Gates. Date of Encounter: 01/04/2021  [facility] Cardiologist: Larae Grooms, MD   Subjective   Feeling well. Denies any chest pain or shortness of breath.  Inpatient Medications    Scheduled Meds: . apixaban  2.5 mg Oral BID  . artificial tears  1 application Left Eye TID  . budesonide (PULMICORT) nebulizer solution  0.5 mg Nebulization BID  . chlorhexidine  15 mL Mouth Rinse BID  . Chlorhexidine Gluconate Cloth  6 each Topical Daily  . digoxin  0.0625 mg Oral Daily  . feeding supplement  237 mL Oral BID BM  . hydrocortisone sod succinate (SOLU-CORTEF) inj  50 mg Intravenous Q8H  . insulin aspart  0-15 Units Subcutaneous TID WC  . linezolid  600 mg Oral Q12H  . mouth rinse  15 mL Mouth Rinse q12n4p  . midodrine  5 mg Oral Q8H  . sodium chloride flush  10 mL Intracatheter Q8H   Continuous Infusions: . sodium chloride Stopped (01/02/21 1136)  . meropenem (MERREM) IV Stopped (01/04/21 0533)   PRN Meds: fentaNYL (SUBLIMAZE) injection, ipratropium-albuterol, lidocaine (PF), lip balm, metoprolol tartrate, ondansetron **OR** ondansetron (ZOFRAN) IV, oxyCODONE, Resource ThickenUp Clear   Vital Signs    Vitals:   01/04/21 0400 01/04/21 0600 01/04/21 0715 01/04/21 1115  BP: (!) 129/58 133/66    Pulse: 77 76    Resp: 18 15    Temp: 97.8 F (36.6 C)  97.7 F (36.5 C) 97.8 F (36.6 C)  TempSrc: Axillary  Oral Oral  SpO2: 96% 98%    Weight:      Height:        Intake/Output Summary (Last 24 hours) at 01/04/2021 1253 Last data filed at 01/04/2021 1238 Gross per 24 hour  Intake 1223.12 ml  Output 1670 ml  Net -446.88 ml   Last 3 Weights 12/31/2020 12/29/2020 12/26/2020  Weight (lbs) 143 lb 8.3 oz 144 lb 2.9 oz 138 lb 3.7 oz  Weight (kg) 65.1 kg 65.4 kg 62.7 kg      Telemetry    Atrial fibrillation. Rates <100 bpm. PVCs.- Personally Reviewed  ECG    n/a - Personally Reviewed  Physical Exam   VS:  BP 133/66   Pulse  76   Temp 97.8 F (36.6 C) (Oral)   Resp 15   Ht 5\' 11"  (1.803 m)   Wt 65.1 kg   SpO2 98%   BMI 20.02 kg/m  , BMI Body mass index is 20.02 kg/m. GENERAL:  Well appearing HEENT: Pupils equal round and reactive, fundi not visualized, oral mucosa unremarkable NECK:  No jugular venous distention, waveform within normal limits, carotid upstroke brisk and symmetric, no bruits LUNGS:  Improved aeration.  No crackles. HEART:  Irregularly irregular.  PMI not displaced or sustained,S1 and S2 within normal limits, no S3, no S4, no clicks, no rubs,  murmurs ABD:  Flat, positive bowel sounds normal in frequency in pitch, no bruits, no rebound, no guarding, no midline pulsatile mass, no hepatomegaly, no splenomegaly EXT:  2 plus pulses throughout, no edema, no cyanosis no clubbing SKIN:  No rashes no nodules NEURO:  Cranial nerves II through XII grossly intact, motor grossly intact throughout PSYCH:  Cognitively intact, oriented to person place and time   Labs    High Sensitivity Troponin:   Recent Labs  Lab 12/28/20 1720 12/31/20 0830  TROPONINIHS 14 9      Chemistry Recent Labs  Lab 12/28/20 1720 12/29/20 1336 12/30/20  6440 12/31/20 0229 01/02/21 0215 01/03/21 0252 01/04/21 0253  NA 140   < > 139 137 137 137 136  K 3.8   < > 3.6 4.1 4.3 4.6 4.2  CL 108   < > 110 107 107 105 104  CO2 27   < > 23 21* 23 25 24   GLUCOSE 116*   < > 91 134* 115* 109* 126*  BUN 17   < > 18 19 21 22 23   CREATININE 0.91   < > 0.70 0.84 0.70 0.82 0.79  CALCIUM 7.5*   < > 7.6* 7.7* 7.8* 8.0* 7.8*  PROT 4.9*  --  4.5* 4.8*  --   --   --   ALBUMIN 1.7*  --  1.5* 1.7*  --   --   --   AST 35  --  35 53*  --   --   --   ALT 31  --  27 40  --   --   --   ALKPHOS 81  --  68 72  --   --   --   BILITOT 0.7  --  0.4 0.2*  --   --   --   GFRNONAA >60   < > >60 >60 >60 >60 >60  ANIONGAP 5   < > 6 9 7 7 8    < > = values in this interval not displayed.     Hematology Recent Labs  Lab 01/02/21 0215  01/03/21 0252 01/04/21 0253  WBC 10.3 10.5 9.5  RBC 3.99* 4.67 4.25  HGB 11.0* 13.0 11.7*  HCT 35.9* 41.4 36.9*  MCV 90.0 88.7 86.8  MCH 27.6 27.8 27.5  MCHC 30.6 31.4 31.7  RDW 14.0 14.1 14.2  PLT 449* 390 367    BNPNo results for input(s): BNP, PROBNP in the last 168 hours.   DDimer No results for input(s): DDIMER in the last 168 hours.   Radiology    DG Chest Port 1 View  Result Date: 01/04/2021 CLINICAL DATA:  Empyema.  Chest tube. EXAM: PORTABLE CHEST 1 VIEW COMPARISON:  01/03/2021.  CT 01/02/2021. FINDINGS: Two right chest tubes in stable position. Minimal residual right base pneumothorax cannot be excluded. Persistent bibasilar atelectasis and infiltrates. Small bilateral pleural effusions with interim progression on the left. Stable cardiomegaly. IMPRESSION: 1. Two right chest tubes in stable position. Minimal residual right base pneumothorax cannot be excluded. 2. Persistent bibasilar atelectasis and infiltrates. Small bilateral pleural effusions with interim progression on the left. Electronically Signed   By: Marcello Moores  Register   On: 01/04/2021 06:44   DG Chest Port 1 View  Result Date: 01/03/2021 CLINICAL DATA:  Pleural effusion. EXAM: PORTABLE CHEST 1 VIEW COMPARISON:  CT 01/02/2021, 01/01/2021.  Chest x-ray 01/02/2021. FINDINGS: New right pigtail chest tube noted over the right upper chest. Stable right lower pigtail chest tube. Small right base pneumothorax again noted. Persistent bibasilar infiltrates. Small bilateral pleural effusions. Stable cardiomegaly. IMPRESSION: 1. New right pigtail chest tube noted over the right upper chest. Stable right lower pigtail chest tube. Small right base pneumothorax again noted. 2. Persistent bibasilar infiltrates without interim change. Small bilateral pleural effusions again noted. 3.  Stable cardiomegaly. Electronically Signed   By: Marcello Moores  Register   On: 01/03/2021 05:48   CT IMAGE GUIDED DRAINAGE BY PERCUTANEOUS CATHETER  Result Date:  01/02/2021 INDICATION: Right-sided empyema and status post CT-guided percutaneous drainage catheter placement on 12/26/2020. Follow-up CT demonstrates improvement but some persistent loculated fluid in the superior and  lateral hemithorax. Request made to try to drain this fluid as the patient is still requiring pressor support. EXAM: CT-GUIDED RIGHT-SIDED THORACOSTOMY TUBE PLACEMENT MEDICATIONS: The patient is currently admitted to the hospital and receiving intravenous antibiotics. The antibiotics were administered within an appropriate time frame prior to the initiation of the procedure. ANESTHESIA/SEDATION: No conscious sedation was administered due to tenuous blood pressure. COMPLICATIONS: Small pneumothorax. SIR LEVEL B - Normal therapy, includes overnight admission for observation. PROCEDURE: Informed written consent was obtained from the patient after a thorough discussion of the procedural risks, benefits and alternatives. All questions were addressed. Maximal Sterile Barrier Technique was utilized including caps, mask, sterile gowns, sterile gloves, sterile drape, hand hygiene and skin antiseptic. A timeout was performed prior to the initiation of the procedure. Initial CT was performed in a decubitus position with the right side up. The right-sided was then rolled down slightly and additional localizing CT performed. The skin was prepped with chlorhexidine. Local anesthesia was provided with 1% lidocaine. Under CT guidance, an 18 gauge trocar needle was advanced into the posterolateral right pleural space. A guidewire was then advanced via the needle and the needle removed. The percutaneous tract was dilated and a 12 French pigtail drainage catheter advanced into the right lateral pleural space. Catheter position was confirmed by CT. The catheter was then secured at the skin with a Prolene retention suture, StatLock device and dressing. FINDINGS: Initial imaging demonstrates a small area of loculated  lentiform shaped fluid in the posterolateral right upper hemithorax. This was targeted for chest tube placement and during needle advancement, the fluid gradually diminished and dissipated consistent with likely a free-flowing component posteriorly and along the course of the major fissure. As the needle was advanced into the pleural space, there was no fluid return and imaging demonstrated a lateral pneumothorax. A guidewire was able to be advanced into the pleural space allowing placement of a drainage catheter. After drain placement, aspiration yielded only air and no fluid return. The drain was connected to a [place] Pleur-evac device and will be connected to wall suction at -20 cm of water. IMPRESSION: During planned percutaneous drainage catheter placement into what appear to be a loculated residual component of empyema in the superior right posterolateral pleural space, the fluid appeared to dissipate as a trocar needle was advanced and resulted in development of a pneumothorax. This was treated by placement of a 12 French pigtail catheter into the lateral pleural space which will be connected to a Pleur-evac device and wall suction. The fluid component clearly dissipated away consistent with a free-flowing component in the pleural space which was likely promoted when the patient was in a decubitus position. Electronically Signed   By: Aletta Edouard M.D.   On: 01/02/2021 17:19    Cardiac Studies   Echo 12/30/20: 1. Left ventricular ejection fraction, by estimation, is 40 to 45%.  Difficult to determine systolic function as poor image quality and patient  is in Afib with RVR, but appears left ventricle has mildly decreased  function. Would consider repeat limited  echo with contrast once heart rate better controlled. Left ventricular  endocardial border not optimally defined to evaluate regional wall motion.  There is mild left ventricular hypertrophy. Left ventricular diastolic  parameters are  indeterminate.  2. Right ventricular systolic function is mildly reduced. The right  ventricular size is mildly enlarged. There is normal pulmonary artery  systolic pressure. The estimated right ventricular systolic pressure is  08.6 mmHg.  3. Left atrial size  was moderately dilated.  4. The mitral valve is degenerative. No evidence of mitral valve  regurgitation. Moderate mitral annular calcification.  5. The aortic valve was not well visualized. Aortic valve regurgitation  is trivial. No aortic stenosis is present.  6. Aortic dilatation noted. There is mild dilatation of the aortic root,  measuring 42 mm. There is mild dilatation of the ascending aorta,  measuring 41 mm.  7. The inferior vena cava is dilated in size with <50% respiratory  variability, suggesting right atrial pressure of 15 mmHg.   Patient Profile     87 y.o. male with  asymptomatic coronary calcification, OSA, longstanding persistent atrial fibrillation, prior GI bleed (secondary to GIST) prostate cancer, frequent falls, mild aortic aneurysm, and anemia admitted with pneumonia 2 weeks after being positive for COVID-19. Hospitalization has been complicated by atrial fibrillation with RVR and hypotension.    Assessment & Plan    # Acute systolic and diastolic heart failure:  # Mixed cardiogenic/septic shock: LVEF 40 to 45% this admission, down from 65 to 70% previously. He is now off of phenylephrine. Blood pressure is stable on midodrine. Continue to wean as able. Mixed cardiogenic and septic shock improving.  Renal function stable.  He also received stress dose steroids.   # Persistent atrial fibrillation: Rates are better controlled. Amiodarone was discontinued given that he remains in atrial fibrillation. Rates are consistently <100 bpm.  Digoxin level today was low. We can titrate the dose as needed for improved rate control.  He was transitioned to Eliquis on 2/2.  His home diltiazem has been held. As blood  pressure tolerates, would prefer to wean him from the midodrine and add metoprolol.  # Recent COVID-19 infection:  # HAP, R-sided loculated pleural effusion:  Chest tube in place and he had an anterior tube placed 2/1.  He has intrapleural fibrinolytic therapy.  BP improving.  Per critical care.     For questions or updates, please contact [place] Please consult [email or web address] for contact info under        Signed, Skeet Latch, MD  01/04/2021, 12:53 PM

## 2021-01-04 NOTE — NC FL2 (Signed)
[place] LEVEL OF CARE SCREENING TOOL     IDENTIFICATION  Patient Name: Casey Gates. Birthdate: [birth year] Sex: male Admission Date (Current Location): 12/17/2020  [facility] and [place] Number:  Herbalist and Address:  [facility],  [address], [place]      Provider Number: [id number]  Attending Physician Name and Address:  Eugenie Filler, MD  Relative Name and Phone Number:  Matheu, Ploeger   [phone]    Current Level of Care: Hospital Recommended Level of Care: [place] Prior Approval Number:    Date Approved/Denied:   PASRR Number: [id number]  Discharge Plan: SNF    Current Diagnoses: Patient Active Problem List   Diagnosis Date Noted  . DNR (do not resuscitate) 12/31/2020  . Shock circulatory ([place])   . Goals of care, counseling/discussion 12/29/2020  . Empyema ([place])   . Atrial fibrillation with RVR ([place]) 12/17/2020  . Acute metabolic encephalopathy 50/08/3817  . Hypernatremia 12/17/2020  . Coagulation defect ([place]) 09/27/2020  . Back pain, chronic 12/14/2018  . Edema 12/14/2018  . Prostate cancer ([place]) 12/14/2018  . History of amputation of lesser toe ([place]) 11/16/2018  . Chronic ulcer of right foot with fat layer exposed ([place]) 11/05/2018  . Contusion of right hip region 07/24/2018  . Multifocal pneumonia 03/18/2018  . Septic shock ([place]) 03/18/2018  . Malignant gastrointestinal stromal tumor (GIST) of stomach ([place]) 03/12/2018  . Urinary retention 02/24/2018  . Gastrointestinal stromal tumor (GIST) of stomach ([place])   . Restless leg syndrome   . Acute GI bleeding 02/16/2018  . Acute blood loss anemia 02/16/2018  . Chronic low back pain 02/05/2018  . Degeneration of lumbar intervertebral disc 02/05/2018  . Constipation 01/20/2018  . Exertional shortness of breath 12/30/2017  . Rupture of left quadriceps muscle 07/07/2017  . Rupture of left quadriceps tendon 06/26/2017  .  Eczema 06/26/2017  . Chest pain 07/29/2015  . OSA on CPAP 07/10/2015  . CPAP use counseling 07/10/2015  . Excessive daytime sleepiness 02/22/2015  . Sleep apnea 02/22/2015  . Snoring 02/22/2015  . Nocturia more than twice per night 02/22/2015  . Syncope 12/31/2012  . Bifascicular bundle branch block 12/31/2012  . Cellulitis and abscess of leg, except foot 12/04/2012  . Chronic pain 12/04/2012  . Kyphosis 12/04/2012  . H/O unilateral nephrectomy 12/04/2012  . H/O prostate cancer 12/04/2012  . S/P TURP 12/04/2012    Orientation RESPIRATION BLADDER Height & Weight     Self,Time,Situation,Place  Normal Continent Weight: 65.1 kg Height:  5\' 11"  (180.3 cm)  BEHAVIORAL SYMPTOMS/MOOD NEUROLOGICAL BOWEL NUTRITION STATUS      Continent Diet (regular)  AMBULATORY STATUS COMMUNICATION OF NEEDS Skin   Extensive Assist Verbally Normal                       Personal Care Assistance Level of Assistance  Bathing,Feeding,Dressing Bathing Assistance: Limited assistance Feeding assistance: Limited assistance Dressing Assistance: Limited assistance     Functional Limitations Info  Sight,Hearing,Speech Sight Info: Adequate Hearing Info: Adequate Speech Info: Adequate    SPECIAL CARE FACTORS FREQUENCY  PT (By licensed PT),OT (By licensed OT)     PT Frequency: 5 x weekly OT Frequency: 5 x weekly            Contractures Contractures Info: Not present    Additional Factors Info  Code Status Code Status Info: DNR Allergies Info: coconut and products, doxycycline, black walnuts Psychotropic Info: mirtazapine (  REMERON) tablet 30 mg   Isolation Precautions Info: none     Current Medications (01/04/2021):  This is the current hospital active medication list Current Facility-Administered Medications  Medication Dose Route Frequency Provider Last Rate Last Admin  . 0.9 %  sodium chloride infusion  250 mL Intravenous Continuous Brand Males, MD   Stopped at 01/02/21 1136  .  artificial tears (LACRILUBE) ophthalmic ointment 1 application  1 application Left Eye TID Oswald Hillock, MD   1 application at [id number]  . budesonide (PULMICORT) nebulizer solution 0.5 mg  0.5 mg Nebulization BID Alfredo Martinez, Brandi L, NP   0.5 mg at 01/03/21 0843  . chlorhexidine (PERIDEX) 0.12 % solution 15 mL  15 mL Mouth Rinse BID Nita [name], MD   15 mL at 01/03/21 2128  . Chlorhexidine Gluconate Cloth 2 % PADS 6 each  6 each Topical Daily Oswald Hillock, MD   6 each at 01/03/21 1000  . digoxin (LANOXIN) tablet 0.0625 mg  0.0625 mg Oral Daily Croitoru, Mihai, MD   0.0625 mg at 01/03/21 1049  . feeding supplement (ENSURE ENLIVE / ENSURE PLUS) liquid 237 mL  237 mL Oral BID BM Oswald Hillock, MD   237 mL at 01/03/21 1552  . fentaNYL (SUBLIMAZE) injection 12.5-25 mcg  12.5-25 mcg Intravenous Q2H PRN Brand Males, MD   25 mcg at 01/03/21 2129  . heparin ADULT infusion 100 units/mL (25000 units/236mL)  1,050 Units/hr Intravenous Continuous Poindexter, Leann T, RPH 10.5 mL/hr at 01/04/21 0602 1,050 Units/hr at 01/04/21 0602  . hydrocortisone sodium succinate (SOLU-CORTEF) 100 MG injection 50 mg  50 mg Intravenous Q6H Brand Males, MD   50 mg at 01/04/21 0026  . insulin aspart (novoLOG) injection 0-15 Units  0-15 Units Subcutaneous TID WC Brand Males, MD   2 Units at 01/01/21 1652  . ipratropium-albuterol (DUONEB) 0.5-2.5 (3) MG/3ML nebulizer solution 3 mL  3 mL Nebulization Q4H PRN Brand Males, MD      . lidocaine (PF) (XYLOCAINE) 1 % injection    PRN Aletta Edouard, MD   10 mL at 01/02/21 1450  . linezolid (ZYVOX) tablet 600 mg  600 mg Oral Q12H Julian Hy, DO   600 mg at 01/03/21 2129  . lip balm (CARMEX) ointment   Topical PRN Brand Males, MD      . MEDLINE mouth rinse  15 mL Mouth Rinse q12n4p Nita [name], MD   15 mL at 01/03/21 1551  . meropenem (MERREM) 1 g in sodium chloride 0.9 % 100 mL IVPB  1 g Intravenous Katrine Coho, [facility]   Stopped  at 01/04/21 0533  . metoprolol tartrate (LOPRESSOR) injection 2.5-5 mg  2.5-5 mg Intravenous Q3H PRN Brand Males, MD   5 mg at 12/30/20 1748  . midodrine (PROAMATINE) tablet 5 mg  5 mg Oral Q8H Ollis, Brandi L, NP   5 mg at 01/04/21 0502  . ondansetron (ZOFRAN) tablet 4 mg  4 mg Oral Q6H PRN Oswald Hillock, MD       Or  . ondansetron (ZOFRAN) injection 4 mg  4 mg Intravenous Q6H PRN Oswald Hillock, MD      . oxyCODONE (Oxy IR/ROXICODONE) immediate release tablet 5 mg  5 mg Oral Q6H PRN Brand Males, MD   5 mg at 01/02/21 1640  . Resource ThickenUp Clear   Oral PRN Oswald Hillock, MD      . sodium chloride flush (NS) 0.9 % injection 10 mL  10  mL Intracatheter Q8H Ollis, Brandi L, NP   10 mL at 01/03/21 1935     Discharge Medications: Please see discharge summary for a list of discharge medications.  Relevant Imaging Results:  Relevant Lab Results:   Additional Information SSN [id number]  Leeroy Cha, RN

## 2021-01-04 NOTE — Progress Notes (Signed)
NAME:  Casey Ayars., MRN:  [id number], DOB:  1926/04/05, LOS: 66 ADMISSION DATE:  12/17/2020, CONSULTATION DATE:  1/24 REFERRING MD:  Dr. Darrick Meigs, CHIEF COMPLAINT:  Exudative Pleural Effusion   Brief History:  87 year old male initially admitted 1/16 with HAP and associated right-sided loculated pleural effusion.  Patient completed broad-spectrum antibiotic course and despite appropriate therapy developed worsening loculated right pleural effusion and evolution of air/gas fluid level.  Right chest tube was placed and the patient completed intrapleural fibrinolytic therapy.  Past Medical History:  OSA Atrial fibrillation on chronic anticoagulation Chronic pain syndrome  Significant Hospital Events:  1/16 Admit, started on broad spectrum abx 1/17 Thoracentesis with exudative pleural effusion, 232ml hazy amber fluid removed 1/18 AFwRVR, Cardiology consulted 1/19 Completed azithromycin  1/23 Completed ceftriaxone  1/24 PCCM consulted  1/26 R chest tube with intraplural tPA / dornase instilled  1/28 Completed 3rd dose intrapleural fibrinolytic therapy  1/29 [place] [facility], afebrile with normal mental status. Labile rhythm / BP on low dose neosynephrine   1/30 [place] [facility], cortisol 16.1, on stress dose steroids on neo. On dig/amio for AF.  2/01 Anterior chest tube placed per IR into R lateral superior loculated pleural fluid  2/03 Apical pocket of fluid cleared on CXR, apical chest tube discontinued  Consults:  Cardiology   Procedures:  R Lateral Chest Tube 1/25 (IR) >>  R Anterior Chest Tube 2/1 >>   Significant Diagnostic Tests:   CT Chest 1/24 >> moderate to large loculated right pleural effusion is increased in volume when compared to 1/16.  New focus of gas with air-fluid level in the posterior compartment of the effusion.  Progressive atelectasis of the entire right lower lobe with partial atelectasis of the right middle lobe and right upper lobe.  Progressive consolidation involving the  posterior medial left lower lobe containing air bronchograms.  CT Chest w/o 2/3 >>   Micro Data:  COVID 1/16 >> negative  BCx2 1/16 >> negative  Pleural fluid 1/17 >> negative  MRSA PCR 1/18 >> positive  Chest tube aspirate 1/25 >> negative  BCx2 1/27 >> negative  UC 1/27 >> negative   Antimicrobials:  Azithro 1/16 >> 1/20  Ceftriaxone 1/16 >> 1/23  Cefepime 1/24 >> 1/27  Vanco 1/24 >> 1/31 Meropenem 1/29 >>   Interim History / Subjective:  Resting in bed, son at bedside Afebrile Pt did not sleep well last night, states is tired 600 ml total out of both chest tubes documented  Objective   Blood pressure 133/66, pulse 76, temperature 97.8 F (36.6 C), temperature source Oral, resp. rate 15, height 5\' 11"  (1.803 m), weight 65.1 kg, SpO2 98 %.        Intake/Output Summary (Last 24 hours) at 01/04/2021 1219 Last data filed at 01/04/2021 0600 Gross per 24 hour  Intake 1163.81 ml  Output 1300 ml  Net -136.19 ml   Filed Weights   12/26/20 1112 12/29/20 0615 12/31/20 0630  Weight: 62.7 kg 65.4 kg 65.1 kg    Examination: General: elderly adult male lying in bed in NAD  HEENT: MM pink/moist, on RA, fair dentition, anicteric  Neuro: AAOx4, speech clear, MAE, HOH CV: s1s2 RRR, no m/r/g PULM: non-labored on RA, good air entry bilaterally, clear anterior, diminished bases, chest tube x2 on right without air leak GI: soft, bsx4 active  Extremities: warm/dry, no edema  Skin: no rashes or lesions  PCXR 2/3 >> images personally reviewed, apical effusion cleared, residual basilar airspace disease  Resolved Hospital  Problem list      Assessment & Plan:   Acute Hypoxemic Respiratory Failure secondary to HAP with Empyema  Wheezing -resolved. OSA - does not wear CPAP  -pulmonary hygiene - IS, mobilize  -OOB to chair / needs PT efforts   Right Empyema secondary to HAP (NOS) Suspect in setting of aspiration.  S/p IR chest tube 1/25, fibrinolytics. Apical CT placed 2/1 for  residual loculated empyema  -discontinue apical chest tube -continue basilar chest tube for now -assess CT chest to ensure no residual loculation > appears to have cleared on CXR  -continue chest tube to suction  -consider removal of second chest tube pending CT review -CT chest imaging will determine duration of abx (short or long course)  Septic Shock  Adrenal Insufficiency  Tx to ICU 1/27.  Exam does not match clinically, cortisol 16.1 ?if relative AI was component -continue midodrine, wean to off once stress dose steroids off -wean stress dose steroids to off  -abx as above  Permanent AF Acute Systolic CHF  On eliquis, cardizem at home.  New systolic CHF (since last ECHO in 2019). Rates improved after digoxin / amio.  -continue digoxin -appreciate Cardiology  -follow T3 / T4 with am labs  -oral anticoagulation per pharmacy   Chronic Pain  On PRN gabapentin, Opana at home  -PRN low dose opioids for pain   Acute Metabolic Encephalopathy  Thought secondary to sepsis complicated by hearing loss. Resolved.  -PT efforts  -delirium prevention measures  -promote sleep / wake cycle   Anemia  -trend CBC  -transfuse for Hgb <7%  Hypophosphatemia  -monitor, replace as indicated  Moderate Protein Calorie Malnutrition  -diet as tolerated   Hyperglycemia  -SSI  Urinary Retention Hx Prostate Cancer -foley replaced 2/2 -may need Urology input   Best practice (evaluated daily)  Diet: D3 diet  Pain/Anxiety/Delirium protocol (if indicated): as above  VAP protocol (if indicated): n/a  DVT prophylaxis: Heparin gtt  GI prophylaxis: n/a  Glucose control: SSI  Mobility: As tolerated  Disposition: Per TRH, anticipate he will need SNF.  PCCM will continue to follow.   Goals of Care:  Last date of multidisciplinary goals of care discussion:1/29  Family and staff present: Son, MD Summary of discussion: full medical care but no CPR Follow up goals of care discussion due: 2/5   Code Status: DNR   Son updated at bedside 2/3 in detail on plan of care.   Labs   CBC: Recent Labs  Lab 12/30/20 0219 12/31/20 0229 01/02/21 0215 01/03/21 0252 01/04/21 0253  WBC 15.5* 18.0* 10.3 10.5 9.5  HGB 10.9* 12.2* 11.0* 13.0 11.7*  HCT 35.2* 39.2 35.9* 41.4 36.9*  MCV 89.8 90.7 90.0 88.7 86.8  PLT 434* 461* 449* 390 789    Basic Metabolic Panel: Recent Labs  Lab 12/28/20 1720 12/29/20 0248 12/30/20 0219 12/31/20 0229 01/01/21 0226 01/02/21 0215 01/03/21 0252 01/04/21 0253  NA 140   < > 139 137  --  137 137 136  K 3.8   < > 3.6 4.1  --  4.3 4.6 4.2  CL 108   < > 110 107  --  107 105 104  CO2 27   < > 23 21*  --  23 25 24   GLUCOSE 116*   < > 91 134*  --  115* 109* 126*  BUN 17   < > 18 19  --  21 22 23   CREATININE 0.91   < > 0.70 0.84  --  0.70 0.82 0.79  CALCIUM 7.5*   < > 7.6* 7.7*  --  7.8* 8.0* 7.8*  MG 2.2  --  2.0 2.2  --   --  2.1  --   PHOS 3.3   < > 2.5 2.4* 2.9 2.8 2.9 2.6   < > = values in this interval not displayed.   GFR: Estimated Creatinine Clearance: 54.3 mL/min (by C-G formula based on SCr of 0.79 mg/dL). Recent Labs  Lab 12/28/20 1720 12/29/20 0248 12/29/20 1336 12/30/20 0219 12/31/20 0229 01/02/21 0215 01/03/21 0252 01/04/21 0253  PROCALCITON 0.13 0.12  --  0.10  --   --   --   --   WBC 23.1*  --    < > 15.5* 18.0* 10.3 10.5 9.5  LATICACIDVEN 1.3  --   --   --  1.3  --   --   --    < > = values in this interval not displayed.    Liver Function Tests: Recent Labs  Lab 12/28/20 1720 12/30/20 0219 12/31/20 0229  AST 35 35 53*  ALT 31 27 40  ALKPHOS 81 68 72  BILITOT 0.7 0.4 0.2*  PROT 4.9* 4.5* 4.8*  ALBUMIN 1.7* 1.5* 1.7*   No results for input(s): LIPASE, AMYLASE in the last 168 hours. No results for input(s): AMMONIA in the last 168 hours.  ABG    Component Value Date/Time   PHART 7.350 12/28/2020 1640   PCO2ART 48.6 (H) 12/28/2020 1640   PO2ART 63.8 (L) 12/28/2020 1640   HCO3 26.3 12/28/2020 1640   TCO2  22 02/16/2018 0732   O2SAT 91.0 12/28/2020 1640     Coagulation Profile: Recent Labs  Lab 12/28/20 1720  INR 1.4*    Cardiac Enzymes: No results for input(s): CKTOTAL, CKMB, CKMBINDEX, TROPONINI in the last 168 hours.  HbA1C: Hgb A1c MFr Bld  Date/Time Value Ref Range Status  12/30/2020 02:19 AM 5.9 (H) 4.8 - 5.6 % Final    Comment:    (NOTE) Pre diabetes:          5.7%-6.4%  Diabetes:              >6.4%  Glycemic control for   <7.0% adults with diabetes     CBG: Recent Labs  Lab 01/03/21 1135 01/03/21 1643 01/03/21 2138 01/04/21 0721 01/04/21 1124  GLUCAP 101* 118* 153* 117* 116*     Critical care time:       Noe Gens, MSN, APRN, NP-C, AGACNP-BC [facility] 01/04/2021, 12:19 PM   Please see [email or web address] for pager details.   From 7A-7P if no response, please call [phone] After hours, please call ELink [phone]

## 2021-01-04 NOTE — TOC Progression Note (Signed)
Transition of Care (TOC) - Progression Note    Patient Details  Name: Casey Gates. MRN: [id number] Date of Birth: 05-13-1926  Transition of Care [facility] CM/SW Contact  Leeroy Cha, RN Phone Number: 01/04/2021, 8:06 AM  Clinical Narrative:    fl2 updated on [id number] snf that have accepted Accepted  [facility] Preferred SNF Details  Fax In [place] Preferred SNF Details  Fax In [place] Preferred SNF Details  Fax In [facility]       Expected Discharge Plan: Home/Self Care Barriers to Discharge: Continued Medical Work up  Expected Discharge Plan and Services Expected Discharge Plan: Home/Self Care   Discharge Planning Services: CM Consult   Living arrangements for the past 2 months: Apartment                                       Social Determinants of Health (SDOH) Interventions    Readmission Risk Interventions No flowsheet data found.

## 2021-01-05 ENCOUNTER — Inpatient Hospital Stay (HOSPITAL_COMMUNITY): Payer: Medicare PPO

## 2021-01-05 DIAGNOSIS — I4891 Unspecified atrial fibrillation: Secondary | ICD-10-CM | POA: Diagnosis not present

## 2021-01-05 DIAGNOSIS — G9341 Metabolic encephalopathy: Secondary | ICD-10-CM | POA: Diagnosis not present

## 2021-01-05 DIAGNOSIS — I5043 Acute on chronic combined systolic (congestive) and diastolic (congestive) heart failure: Secondary | ICD-10-CM

## 2021-01-05 DIAGNOSIS — J9601 Acute respiratory failure with hypoxia: Secondary | ICD-10-CM | POA: Diagnosis not present

## 2021-01-05 DIAGNOSIS — Z66 Do not resuscitate: Secondary | ICD-10-CM | POA: Diagnosis not present

## 2021-01-05 DIAGNOSIS — G8929 Other chronic pain: Secondary | ICD-10-CM | POA: Diagnosis not present

## 2021-01-05 DIAGNOSIS — J869 Pyothorax without fistula: Secondary | ICD-10-CM | POA: Diagnosis not present

## 2021-01-05 LAB — BASIC METABOLIC PANEL
Anion gap: 11 (ref 5–15)
BUN: 23 mg/dL (ref 8–23)
CO2: 25 mmol/L (ref 22–32)
Calcium: 7.9 mg/dL — ABNORMAL LOW (ref 8.9–10.3)
Chloride: 104 mmol/L (ref 98–111)
Creatinine, Ser: 0.91 mg/dL (ref 0.61–1.24)
GFR, Estimated: >60 mL/min (ref >60)
Glucose, Bld: 111 mg/dL — ABNORMAL HIGH (ref 70–99)
Potassium: 3.2 mmol/L — ABNORMAL LOW (ref 3.5–5.1)
Sodium: 140 mmol/L (ref 135–145)

## 2021-01-05 LAB — CBC
HCT: 36.7 % — ABNORMAL LOW (ref 39.0–52.0)
Hemoglobin: 11.6 g/dL — ABNORMAL LOW (ref 13.0–17.0)
MCH: 27.6 pg (ref 26.0–34.0)
MCHC: 31.6 g/dL (ref 30.0–36.0)
MCV: 87.2 fL (ref 80.0–100.0)
Platelets: 374 10*3/uL (ref 150–400)
RBC: 4.21 MIL/uL — ABNORMAL LOW (ref 4.22–5.81)
RDW: 14.7 % (ref 11.5–15.5)
WBC: 9 10*3/uL (ref 4.0–10.5)
nRBC: 0 % (ref 0.0–0.2)

## 2021-01-05 LAB — GLUCOSE, CAPILLARY
Glucose-Capillary: 107 mg/dL — ABNORMAL HIGH (ref 70–99)
Glucose-Capillary: 144 mg/dL — ABNORMAL HIGH (ref 70–99)
Glucose-Capillary: 150 mg/dL — ABNORMAL HIGH (ref 70–99)
Glucose-Capillary: 151 mg/dL — ABNORMAL HIGH (ref 70–99)

## 2021-01-05 LAB — MAGNESIUM: Magnesium: 2.1 mg/dL (ref 1.7–2.4)

## 2021-01-05 LAB — T4, FREE: Free T4: 0.53 ng/dL — ABNORMAL LOW (ref 0.61–1.12)

## 2021-01-05 LAB — PHOSPHORUS: Phosphorus: 2.6 mg/dL (ref 2.5–4.6)

## 2021-01-05 MED ORDER — POTASSIUM CHLORIDE CRYS ER 20 MEQ PO TBCR
40.0000 meq | EXTENDED_RELEASE_TABLET | ORAL | Status: AC
  Administered 2021-01-05 (×2): 40 meq via ORAL
  Filled 2021-01-05 (×2): qty 2

## 2021-01-05 MED ORDER — AMOXICILLIN-POT CLAVULANATE 875-125 MG PO TABS
1.0000 | ORAL_TABLET | Freq: Two times a day (BID) | ORAL | Status: DC
  Administered 2021-01-05 – 2021-01-10 (×10): 1 via ORAL
  Filled 2021-01-05 (×10): qty 1

## 2021-01-05 MED ORDER — HYDROCORTISONE NA SUCCINATE PF 100 MG IJ SOLR
50.0000 mg | Freq: Two times a day (BID) | INTRAMUSCULAR | Status: DC
  Administered 2021-01-05 – 2021-01-06 (×2): 50 mg via INTRAVENOUS
  Filled 2021-01-05 (×2): qty 2

## 2021-01-05 NOTE — Progress Notes (Signed)
Progress Note  Patient Name: Casey Gates. Date of Encounter: 01/05/2021  [facility] Cardiologist: Larae Grooms, MD   Subjective   Resting comfortably. No chest pain/sob per son.  He has been eating and drinking well.   Inpatient Medications    Scheduled Meds: . apixaban  2.5 mg Oral BID  . artificial tears  1 application Left Eye TID  . budesonide (PULMICORT) nebulizer solution  0.5 mg Nebulization BID  . chlorhexidine  15 mL Mouth Rinse BID  . Chlorhexidine Gluconate Cloth  6 each Topical Daily  . digoxin  0.0625 mg Oral Daily  . feeding supplement  237 mL Oral BID BM  . hydrocortisone sod succinate (SOLU-CORTEF) inj  50 mg Intravenous Q8H  . insulin aspart  0-15 Units Subcutaneous TID WC  . linezolid  600 mg Oral Q12H  . mouth rinse  15 mL Mouth Rinse q12n4p  . midodrine  5 mg Oral Q8H  . potassium chloride  40 mEq Oral Q4H  . sodium chloride flush  10 mL Intracatheter Q8H   Continuous Infusions: . sodium chloride Stopped (01/02/21 1136)  . meropenem (MERREM) IV Stopped (01/05/21 0555)   PRN Meds: fentaNYL (SUBLIMAZE) injection, ipratropium-albuterol, lidocaine (PF), lip balm, metoprolol tartrate, ondansetron **OR** ondansetron (ZOFRAN) IV, oxyCODONE, Resource ThickenUp Clear   Vital Signs    Vitals:   01/05/21 0800 01/05/21 0801 01/05/21 0838 01/05/21 0907  BP: 128/72     Pulse: 80   92  Resp:  12    Temp: 97.8 F (36.6 C)     TempSrc: Oral     SpO2: 94%  97%   Weight:      Height:        Intake/Output Summary (Last 24 hours) at 01/05/2021 1057 Last data filed at 01/05/2021 0400 Gross per 24 hour  Intake 59.31 ml  Output 3395 ml  Net -3335.69 ml   Last 3 Weights 12/31/2020 12/29/2020 12/26/2020  Weight (lbs) 143 lb 8.3 oz 144 lb 2.9 oz 138 lb 3.7 oz  Weight (kg) 65.1 kg 65.4 kg 62.7 kg      Telemetry    Atrial fibrillation. Rates <100 bpm. PVCs.- Personally Reviewed  ECG    n/a - Personally Reviewed  Physical Exam   VS:  BP 128/72  (BP Location: Left Arm)   Pulse 92   Temp 97.8 F (36.6 C) (Oral)   Resp 12   Ht 5\' 11"  (1.803 m)   Wt 65.1 kg   SpO2 97%   BMI 20.02 kg/m  , BMI Body mass index is 20.02 kg/m. GENERAL:  Well appearing HEENT: Pupils equal round and reactive, fundi not visualized, oral mucosa unremarkable NECK:  No jugular venous distention, waveform within normal limits, carotid upstroke brisk and symmetric, no bruits LUNGS:  CTAB. HEART:  Irregularly irregular.  PMI not displaced or sustained,S1 and S2 within normal limits, no S3, no S4, no clicks, no rubs,  murmurs ABD:  Flat, positive bowel sounds normal in frequency in pitch, no bruits, no rebound, no guarding, no midline pulsatile mass, no hepatomegaly, no splenomegaly EXT:  2 plus pulses throughout, no edema, no cyanosis no clubbing SKIN:  No rashes no nodules NEURO:  Cranial nerves II through XII grossly intact, motor grossly intact throughout PSYCH:  Cognitively intact, oriented to person place and time   Labs    High Sensitivity Troponin:   Recent Labs  Lab 12/28/20 1720 12/31/20 0830  TROPONINIHS 14 9      Chemistry Recent  Labs  Lab 12/30/20 0219 12/31/20 0229 01/02/21 0215 01/03/21 0252 01/04/21 0253 01/05/21 0246  NA 139 137   < > 137 136 140  K 3.6 4.1   < > 4.6 4.2 3.2*  CL 110 107   < > 105 104 104  CO2 23 21*   < > 25 24 25   GLUCOSE 91 134*   < > 109* 126* 111*  BUN 18 19   < > 22 23 23   CREATININE 0.70 0.84   < > 0.82 0.79 0.91  CALCIUM 7.6* 7.7*   < > 8.0* 7.8* 7.9*  PROT 4.5* 4.8*  --   --   --   --   ALBUMIN 1.5* 1.7*  --   --   --   --   AST 35 53*  --   --   --   --   ALT 27 40  --   --   --   --   ALKPHOS 68 72  --   --   --   --   BILITOT 0.4 0.2*  --   --   --   --   GFRNONAA >60 >60   < > >60 >60 >60  ANIONGAP 6 9   < > 7 8 11    < > = values in this interval not displayed.     Hematology Recent Labs  Lab 01/03/21 0252 01/04/21 0253 01/05/21 0246  WBC 10.5 9.5 9.0  RBC 4.67 4.25 4.21*  HGB  13.0 11.7* 11.6*  HCT 41.4 36.9* 36.7*  MCV 88.7 86.8 87.2  MCH 27.8 27.5 27.6  MCHC 31.4 31.7 31.6  RDW 14.1 14.2 14.7  PLT 390 367 374    BNPNo results for input(s): BNP, PROBNP in the last 168 hours.   DDimer No results for input(s): DDIMER in the last 168 hours.   Radiology    CT CHEST WO CONTRAST  Result Date: 01/04/2021 CLINICAL DATA:  Right pleural effusion. EXAM: CT CHEST WITHOUT CONTRAST TECHNIQUE: Multidetector CT imaging of the chest was performed following the standard protocol without IV contrast. COMPARISON:  January 01, 2021. FINDINGS: Cardiovascular: 4.3 cm ascending thoracic aortic aneurysm is noted. Atherosclerosis of thoracic aorta is noted. Coronary artery calcifications are noted. Normal cardiac size. No pericardial effusion. Mediastinum/Nodes: No enlarged mediastinal or axillary lymph nodes. Thyroid gland, trachea, and esophagus demonstrate no significant findings. Lungs/Pleura: Stable position of right-sided pigtail drainage catheter seen in right hemithorax. Stable small right pleural effusion is noted. Significantly increased amount of air is noted in upper lung zone consistent with increased right hydropneumothorax. Stable moderate left pleural effusion is noted with associated atelectasis or infiltrate of the left lower lobe. Stable right middle lobe and lower lobe opacities are noted concerning for atelectasis or infiltrates. Upper Abdomen: No acute abnormality. Musculoskeletal: No significant osseous abnormality is noted. There is interval development of subcutaneous emphysema involving the right posterior chest wall. IMPRESSION: 1. Stable position of right-sided pigtail drainage catheter seen in right hemithorax. Significantly increased amount of air is noted in the upper lung zone consistent with increased right hydropneumothorax. Stable small right pleural effusion is noted. 2. Stable moderate left pleural effusion is noted with associated atelectasis or infiltrate of  the left lower lobe. 3. Stable right middle lobe and lower lobe opacities are noted concerning for atelectasis or infiltrates. 4. Interval development of subcutaneous emphysema involving the right posterior chest wall. 5. Coronary artery calcifications are noted. 6. 4.3 cm ascending thoracic aortic aneurysm is  noted. 7. Aortic atherosclerosis. Aortic Atherosclerosis (ICD10-I70.0). Electronically Signed   By: Marijo Conception M.D.   On: 01/04/2021 14:45   DG Chest Port 1 View  Result Date: 01/05/2021 CLINICAL DATA:  Follow-up empyema. EXAM: PORTABLE CHEST 1 VIEW COMPARISON:  Chest x-ray and chest CT 01/04/2021 FINDINGS: The upper right chest tube has been removed. The lower chest tube is still in place. There is a small persistent pneumothorax estimated at 5-10%. Stable small bilateral pleural effusions and bibasilar atelectasis. IMPRESSION: Persistent 5-10% right-sided pneumothorax (when compared to the prior chest CT). Electronically Signed   By: Marijo Sanes M.D.   On: 01/05/2021 08:19   DG Chest Port 1 View  Result Date: 01/04/2021 CLINICAL DATA:  Empyema.  Chest tube. EXAM: PORTABLE CHEST 1 VIEW COMPARISON:  01/03/2021.  CT 01/02/2021. FINDINGS: Two right chest tubes in stable position. Minimal residual right base pneumothorax cannot be excluded. Persistent bibasilar atelectasis and infiltrates. Small bilateral pleural effusions with interim progression on the left. Stable cardiomegaly. IMPRESSION: 1. Two right chest tubes in stable position. Minimal residual right base pneumothorax cannot be excluded. 2. Persistent bibasilar atelectasis and infiltrates. Small bilateral pleural effusions with interim progression on the left. Electronically Signed   By: Marcello Moores  Register   On: 01/04/2021 06:44    Cardiac Studies   Echo 12/30/20: 1. Left ventricular ejection fraction, by estimation, is 40 to 45%.  Difficult to determine systolic function as poor image quality and patient  is in Afib with RVR, but  appears left ventricle has mildly decreased  function. Would consider repeat limited  echo with contrast once heart rate better controlled. Left ventricular  endocardial border not optimally defined to evaluate regional wall motion.  There is mild left ventricular hypertrophy. Left ventricular diastolic  parameters are indeterminate.  2. Right ventricular systolic function is mildly reduced. The right  ventricular size is mildly enlarged. There is normal pulmonary artery  systolic pressure. The estimated right ventricular systolic pressure is  59.7 mmHg.  3. Left atrial size was moderately dilated.  4. The mitral valve is degenerative. No evidence of mitral valve  regurgitation. Moderate mitral annular calcification.  5. The aortic valve was not well visualized. Aortic valve regurgitation  is trivial. No aortic stenosis is present.  6. Aortic dilatation noted. There is mild dilatation of the aortic root,  measuring 42 mm. There is mild dilatation of the ascending aorta,  measuring 41 mm.  7. The inferior vena cava is dilated in size with <50% respiratory  variability, suggesting right atrial pressure of 15 mmHg.   Patient Profile     87 y.o. male with  asymptomatic coronary calcification, OSA, longstanding persistent atrial fibrillation, prior GI bleed (secondary to GIST) prostate cancer, frequent falls, mild aortic aneurysm, and anemia admitted with pneumonia 2 weeks after being positive for COVID-19. Hospitalization has been complicated by atrial fibrillation with RVR and hypotension.    Assessment & Plan    # Acute systolic and diastolic heart failure:  # Mixed cardiogenic/septic shock: LVEF 40 to 45% this admission, down from 65 to 70% previously. Mixed cardiogenic and septic shock improving.  He is now off of phenylephrine. Blood pressure is stable on midodrine but a little low.  Unable to wean at this time.  He received a dose of lasix yesterday and renal function is stable.   He also received stress dose steroids. Continue digoxin.   # Persistent atrial fibrillation: Rates are better controlled. Amiodarone was discontinued given that he remains in  atrial fibrillation. Rates are consistently <100 bpm.  Digoxin level today was low. We can titrate the dose as needed for improved rate control.  He was transitioned to Eliquis on 2/2.  His home diltiazem has been held. As blood pressure tolerates, would prefer to wean him from the midodrine and add metoprolol.  # Recent COVID-19 infection:  # HAP, R-sided loculated pleural effusion:  Chest tube in place and he had an anterior tube placed 2/1.  One tube was remained 2/3.  He has intrapleural fibrinolytic therapy.  BP improving.  Per critical care.     For questions or updates, please contact [place] Please consult [email or web address] for contact info under        Signed, Skeet Latch, MD  01/05/2021, 10:57 AM

## 2021-01-05 NOTE — Plan of Care (Signed)

## 2021-01-05 NOTE — Progress Notes (Addendum)
PROGRESS NOTE    Casey Gates.  [id number] DOB: 1926/11/16 DOA: 12/17/2020 PCP: Velna Hatchet, MD    Chief Complaint  Patient presents with  . Urinary Tract Infection    Brief Narrative:  Patient 87 year old gentleman initially admitted 12/17/2020 with pneumonia and associated right-sided loculated pleural effusion.  Patient completed full course of antibiotics however continued to have worsening loculated right pleural effusion with evolution of air/gas fluid level.  Right chest tube placed and patient completed intrapleural fibrinolytic therapy. Patient subsequently noted to go into septic shock with concerns for mild adrenal insufficiency and transferred to the ICU and subsequently placed on pressors as well as stress dose steroids.  Patient had a subsequent second chest tube placed by IR in the right lateral superior loculated pleural fluid.  Patient also on midodrine.  Patient also noted to go into A. fib with RVR cardiology consulted patient initially on amiodarone due to shock and subsequently transition to digoxin.  Cardiology following. Pressors weaned off.  Patient subsequently transferred to [facility].  PCCM following for chest tube management.   Assessment & Plan:   Principal Problem:   Acute respiratory failure with hypoxia (HCC) Active Problems:   Multifocal pneumonia   Chronic pain   Urinary retention   Septic shock (HCC)   Prostate cancer (HCC)   Atrial fibrillation with RVR (HCC)   Acute metabolic encephalopathy   Hypernatremia   Empyema (HCC)   DNR (do not resuscitate)   Goals of care, counseling/discussion   Shock circulatory ([place])   Large pleural effusion   1 acute hypoxemic respiratory failure secondary to pneumonia with empyema/OSA/right-sided pneumothorax Patient with clinical improvement.  Currently on room air.  Status post chest tube placement x2.  Apical chest tube discontinued 01/04/2021.  CT chest done concerning for increased right  hydropneumothorax, stable moderate left pleural effusion, stable right middle lobe and lower lobe opacities, interval development of subcutaneous emphysema involving right posterior chest wall.  Chest x-ray from today with persistent 5 to 10% right-sided pneumothorax.  Patient denies any significant shortness of breath.  Continue pulmonary hygiene, mobilization.  Continue Pulmicort.  PT/OT following.  Per PCCM.  2.  Right empyema secondary to PNA Concern that patient's right empyema may be in the setting of aspiration. Status post chest tube placement per IR 12/26/2020 with completion of fibrinolytics.  Patient with apical chest tube placed to 12/21/2020 for residual loculated empyema.  Apical chest tube discontinued 01/04/2021.  CT chest (01/04/2021) with stable position right-sided pigtail drainage catheter seen in right hemithorax, significantly increased amount of air noted in the upper lung zone consistent with increased right hydropneumothorax, stable right small pleural effusion.  Stable moderate left pleural effusion noted with associated atelectasis or infiltrate in the left lower lobe, stable right middle lobe and lower lobe opacities noted concerning for atelectasis or infiltrates, interval development of subcutaneous emphysema involving right posterior chest wall.  Repeat chest x-ray this morning with persistent 5 to 10% right-sided pneumothorax. Chest tube management per PCCM.  Continue empiric IV Merrem and Zyvox for now.  Follow.  3.  Septic shock/adrenal insufficiency Patient was transferred to the ICU 12/28/2018.  Has noted to be in septic shock requiring pressors.  Patient also placed on stress dose steroids.  Cortisol level noted at 16.1.  Patient currently on midodrine and IV Solu-Cortef.  Systolic blood pressure this morning of 95.  Continue IV Solu-Cortef 50 mg every 8 hours and hopefully to wean down to every 12 hours tomorrow.  Continue  empiric IV antibiotics.  Follow.  4.  Persistent  atrial fibrillation Patient initially was on amiodarone for management of A. fib as patient also noted to be in septic shock.  Amiodarone discontinued and patient placed on digoxin for better rate control.  Heart rate improved.  Blood pressure improved on current regimen of midodrine and IV Solu-Cortef. Diltiazem currently on hold.  IV Solu-Cortef currently 50 mg every 8 hours from every 6 hours.  Systolic blood pressure of 95 this morning.  Also cardiology preferring to wean patient off midodrine to add metoprolol.  Continue current dose of midodrine.  Was on IV heparin and has been transitioned to Eliquis as of 01/04/2021 as per PCCM no further indications for procedures at this time.  Replete potassium keep potassium approximately 4.0.  Keep magnesium > 2.  Cardiology following.   5.  Acute systolic and diastolic heart failure/mixed cardiogenic shock 2D echo done with EF of 40 to 45% down from 65 to 70%.  Patient required pressors early on during the hospitalization which have subsequently been weaned off.  Urine output of 3.150 L over the past 24 hours.  Patient is -1.6 L during this hospitalization.  Blood pressure currently stable with systolic in the high 83T to low 100s on midodrine.  Patient also on stress dose steroids which are being weaned down.  Continue digoxin.  Per cardiology.  6.  Acute metabolic encephalopathy Secondary to sepsis.  Resolved.  Follow.  7.  Hypophosphatemia/hypokalemia Repleted.  Phosphorus at 2.6.  Potassium at 3.2.  K. Dur 40 mEq p.o. every 4 hours x2 doses.  Repeat labs in the morning.  8.  Chronic pain Stable.  Patient noted to be on Opana and Neurontin as needed prior to admission.  Continue oxycodone 5 mg p.o. every 6 hours as needed pain.  9.  Moderate protein calorie malnutrition Continue current dysphagia 3 diet.  Follow.  10.  History of prostate cancer/urinary retention Foley catheter replaced on 01/03/2021.  Urine output of 3.150 L over the past 24 hours.   Outpatient follow-up with urology.  11.  Anemia Patient with no overt bleeding.  Hemoglobin currently stable at 11.6.  Monitor closely with anticoagulation.    DVT prophylaxis: Heparin>>>> Eliquis Code Status: DNR Family Communication: Updated patient and son at bedside Disposition:   Status is: Inpatient    Dispo: The patient is from: ALF              Anticipated d/c is to: SNF              Anticipated d/c date is: To be determined.              Patient currently on midodrine, IV stress dose steroids, antibiotics, chest tubes x1, not stable for discharge.   Difficult to place patient undetermined       Consultants:   PCCM: Dr. Chase Caller 12/25/2020  Cardiology: Dr. Johney Frame 12/19/2020  Significant Hospital Events:  1/16 Admit, started on broad spectrum abx 1/17 Thoracentesis with exudative pleural effusion, 256ml hazy amber fluid removed 1/18 [facility], Cardiology consulted 1/19 Completed azithromycin  1/23 Completed ceftriaxone  1/24 PCCM consulted  1/26 R chest tube with intraplural tPA / dornase instilled  1/28 Completed 3rd dose intrapleural fibrinolytic therapy  1/29 [place] [facility], afebrile with normal mental status. Labile rhythm / BP on low dose neosynephrine   1/30 [place] [facility], cortisol 16.1, on stress dose steroids on neo. On dig/amio for AF.  2/01 Anterior chest tube placed per IR into  R lateral superior loculated pleural fluid  2/03 Apical pocket of fluid cleared on CXR, apical chest tube discontinued 2/04 persistent 5- 10% right-sided pneumothorax     Procedures:   CT Chest 1/24 >> moderate to large loculated right pleural effusion is increased in volume when compared to 1/16.  New focus of gas with air-fluid level in the posterior compartment of the effusion.  Progressive atelectasis of the entire right lower lobe with partial atelectasis of the right middle lobe and right upper lobe.  Progressive consolidation involving the posterior medial left lower lobe containing  air bronchograms. CT Chest w/o 2/3 >> 1. Stable position of right-sided pigtail drainage catheter seen in right hemithorax. Significantly increased amount of air is noted in the upper lung zone consistent with increased right hydropneumothorax. Stable small right pleural effusion is noted. 2. Stable moderate left pleural effusion is noted with associated atelectasis or infiltrate of the left lower lobe. 3. Stable right middle lobe and lower lobe opacities are noted concerning for atelectasis or infiltrates. 4. Interval development of subcutaneous emphysema involving the right posterior chest wall. 5. Coronary artery calcifications are noted. 6. 4.3 cm ascending thoracic aortic aneurysm is noted.  7. Aortic atherosclerosis.  R lateral chest tube 1/25 per IR>>>  Right anterior chest tube 2/1 >>>    Antimicrobials:  Azithro 1/16 >> 1/20  Ceftriaxone 1/16 >> 1/23  Cefepime 1/24 >> 1/27  Vanco 1/24 >> 1/31 Meropenem 1/29 >>  Zyvox 1/31>>  Micro Data:  COVID 1/16 >> negative  BCx2 1/16 >> negative  Pleural fluid 1/17 >> negative  MRSA PCR 1/18 >> positive  Chest tube aspirate 1/25 >> negative  BCx2 1/27 >> negative  UC 1/27 >> negative     Subjective: Patient sitting up in bed eating breakfast.  Son at bedside feeding patient.  Patient denies any significant shortness of breath.  No chest pain.  No nausea.  No abdominal pain.  Feeling a little bit better.  Systolic blood pressure of 95 noted on monitor.   Objective: Vitals:   01/05/21 0800 01/05/21 0801 01/05/21 0838 01/05/21 0907  BP: 128/72     Pulse: 80   92  Resp:  12    Temp: 97.8 F (36.6 C)     TempSrc: Oral     SpO2: 94%  97%   Weight:      Height:        Intake/Output Summary (Last 24 hours) at 01/05/2021 1004 Last data filed at 01/05/2021 0400 Gross per 24 hour  Intake 59.31 ml  Output 3395 ml  Net -3335.69 ml   Filed Weights   12/26/20 1112 12/29/20 0615 12/31/20 0630  Weight: 62.7 kg 65.4 kg 65.1  kg    Examination:  General exam: NAD Respiratory system: Decreased breath sounds right upper lobe, no wheezing, no crackles.  Decreased breath sounds in the bases.  Right chest tube noted.   Cardiovascular system: Irregularly irregular.  No JVD.  No lower extremity edema.  Gastrointestinal system: Abdomen is soft, nontender, nondistended, positive bowel sounds.  No rebound.  No guarding. Central nervous system: Alert and oriented. No focal neurological deficits. Extremities: Symmetric 5 x 5 power. Skin: No rashes, lesions or ulcers Psychiatry: Judgement and insight appear normal. Mood & affect appropriate.     Data Reviewed: I have personally reviewed following labs and imaging studies  CBC: Recent Labs  Lab 12/31/20 0229 01/02/21 0215 01/03/21 0252 01/04/21 0253 01/05/21 0246  WBC 18.0* 10.3 10.5 9.5 9.0  HGB  12.2* 11.0* 13.0 11.7* 11.6*  HCT 39.2 35.9* 41.4 36.9* 36.7*  MCV 90.7 90.0 88.7 86.8 87.2  PLT 461* 449* 390 367 [id number]    Basic Metabolic Panel: Recent Labs  Lab 12/30/20 0219 12/31/20 0229 01/01/21 0226 01/02/21 0215 01/03/21 0252 01/04/21 0253 01/05/21 0246  NA 139 137  --  137 137 136 140  K 3.6 4.1  --  4.3 4.6 4.2 3.2*  CL 110 107  --  107 105 104 104  CO2 23 21*  --  23 25 24 25   GLUCOSE 91 134*  --  115* 109* 126* 111*  BUN 18 19  --  21 22 23 23   CREATININE 0.70 0.84  --  0.70 0.82 0.79 0.91  CALCIUM 7.6* 7.7*  --  7.8* 8.0* 7.8* 7.9*  MG 2.0 2.2  --   --  2.1  --  2.1  PHOS 2.5 2.4* 2.9 2.8 2.9 2.6 2.6    GFR: Estimated Creatinine Clearance: 47.7 mL/min (by C-G formula based on SCr of 0.91 mg/dL).  Liver Function Tests: Recent Labs  Lab 12/30/20 0219 12/31/20 0229  AST 35 53*  ALT 27 40  ALKPHOS 68 72  BILITOT 0.4 0.2*  PROT 4.5* 4.8*  ALBUMIN 1.5* 1.7*    CBG: Recent Labs  Lab 01/03/21 2138 01/04/21 0721 01/04/21 1124 01/04/21 1633 01/05/21 0743  GLUCAP 153* 117* 116* 122* 107*     Recent Results (from the past 240  hour(s))  Aerobic/Anaerobic Culture (surgical/deep wound)     Status: None   Collection Time: 12/26/20  4:42 PM   Specimen: Abscess  Result Value Ref Range Status   Specimen Description   Final    ABSCESS POST CT GUIDED RIGHT CHEST TUBE PLACEMENT Performed at [facility] [address], [place], [place] [address]    Special Requests   Final    NONE Performed at [facility], [place] [address] [place], [place] [address]    Gram Stain NO WBC SEEN NO ORGANISMS SEEN   Final   Culture   Final    No growth aerobically or anaerobically. Performed at [facility], [place] [address] [place], [place] [address]    Report Status 12/31/2020 FINAL  Final  Culture, blood (Routine X 2) w Reflex to ID Panel     Status: None   Collection Time: 12/28/20  9:17 AM   Specimen: BLOOD  Result Value Ref Range Status   Specimen Description   Final    BLOOD BLOOD RIGHT FOREARM Performed at [facility] [address] [place], [place] [address]    Special Requests   Final    BOTTLES DRAWN AEROBIC ONLY Blood Culture adequate volume Performed at [facility] [address], [place], [place] [address]    Culture   Final    NO GROWTH 5 DAYS Performed at [facility], [place] [address] [place] [address] [place], [place] [address]    Report Status 01/02/2021 FINAL  Final  Culture, blood (Routine X 2) w Reflex to ID Panel     Status: None   Collection Time: 12/28/20  9:19 AM   Specimen: BLOOD RIGHT HAND  Result Value Ref Range Status   Specimen Description   Final    BLOOD RIGHT HAND Performed at [facility] [address], [place],  [address]    Special Requests   Final    BOTTLES DRAWN AEROBIC ONLY Blood Culture adequate volume  Performed at [facility], [place] [address], [place], [place] [address]    Culture   Final    NO GROWTH 5 DAYS Performed at [facility], [place] [address], [place], [place] [address]    Report Status 01/02/2021 FINAL  Final  Culture, Urine     Status: None   Collection Time: 12/28/20  9:34 AM   Specimen: Urine, Catheterized  Result Value Ref Range Status   Specimen Description   Final    URINE, CATHETERIZED Performed at [facility] [address] [place], [place] [address]    Special Requests   Final    NONE Performed at [facility], [place] [address], [place], [place] [address]    Culture   Final    NO GROWTH Performed at [name], [place] [address] [place], [place] [address]    Report Status 12/29/2020 FINAL  Final  Culture, blood (Routine X 2) w Reflex to ID Panel     Status: None   Collection Time: 12/28/20  5:20 PM   Specimen: BLOOD LEFT HAND  Result Value Ref Range Status   Specimen Description   Final    BLOOD LEFT HAND Performed at [facility] [address], [place], [place] [address]    Special Requests   Final    BOTTLES DRAWN AEROBIC ONLY Blood Culture adequate volume Performed at [facility] [address], [place], [place] [address]    Culture   Final    NO GROWTH 5 DAYS Performed at [facility], [place] [address], [place], [place] [address]    Report Status 01/03/2021 FINAL  Final  Culture, blood (Routine X 2) w Reflex to ID Panel     Status: None   Collection Time: 12/28/20  5:20 PM   Specimen: BLOOD  Result Value Ref Range Status   Specimen Description   Final    BLOOD RIGHT ANTECUBITAL Performed at [facility] [address] [place], [place] [address]    Special Requests   Final    BOTTLES DRAWN AEROBIC ONLY Blood Culture adequate volume Performed at [facility] [address], [place], [place] [address]    Culture   Final    NO GROWTH 5 DAYS Performed at [facility], [place] [address] [place],  [address]    Report Status 01/03/2021 FINAL  Final          Radiology Studies: CT CHEST WO CONTRAST  Result Date: 01/04/2021 CLINICAL DATA:  Right pleural effusion. EXAM: CT CHEST WITHOUT CONTRAST TECHNIQUE: Multidetector CT imaging of the chest was performed following the standard protocol without IV contrast. COMPARISON:  January 01, 2021. FINDINGS: Cardiovascular: 4.3 cm ascending thoracic aortic aneurysm is noted. Atherosclerosis of thoracic aorta is noted. Coronary artery calcifications are noted. Normal cardiac size. No pericardial effusion. Mediastinum/Nodes: No enlarged mediastinal or axillary lymph nodes. Thyroid gland, trachea, and esophagus demonstrate no significant findings. Lungs/Pleura: Stable position of right-sided pigtail drainage catheter seen in right hemithorax. Stable small right pleural effusion is noted. Significantly increased amount of air is noted in upper lung zone consistent with increased right hydropneumothorax. Stable moderate left pleural effusion is noted with associated atelectasis or infiltrate of the left lower lobe. Stable right middle lobe and lower lobe opacities are noted concerning for atelectasis or infiltrates. Upper Abdomen: No acute abnormality. Musculoskeletal: No significant osseous abnormality is noted. There is interval development  of subcutaneous emphysema involving the right posterior chest wall. IMPRESSION: 1. Stable position of right-sided pigtail drainage catheter seen in right hemithorax. Significantly increased amount of air is noted in the upper lung zone consistent with increased right hydropneumothorax. Stable small right pleural effusion is noted. 2. Stable moderate left pleural effusion is noted with associated atelectasis or infiltrate of the left lower lobe. 3. Stable right middle lobe and lower lobe opacities are noted concerning for atelectasis or infiltrates. 4. Interval development of subcutaneous emphysema involving the right posterior chest wall. 5. Coronary artery calcifications are noted.  6. 4.3 cm ascending thoracic aortic aneurysm is noted. 7. Aortic atherosclerosis. Aortic Atherosclerosis (ICD10-I70.0). Electronically Signed   By: Marijo Conception M.D.   On: 01/04/2021 14:45   DG Chest Port 1 View  Result Date: 01/05/2021 CLINICAL DATA:  Follow-up empyema. EXAM: PORTABLE CHEST 1 VIEW COMPARISON:  Chest x-ray and chest CT 01/04/2021 FINDINGS: The upper right chest tube has been removed. The lower chest tube is still in place. There is a small persistent pneumothorax estimated at 5-10%. Stable small bilateral pleural effusions and bibasilar atelectasis. IMPRESSION: Persistent 5-10% right-sided pneumothorax (when compared to the prior chest CT). Electronically Signed   By: Marijo Sanes M.D.   On: 01/05/2021 08:19   DG Chest Port 1 View  Result Date: 01/04/2021 CLINICAL DATA:  Empyema.  Chest tube. EXAM: PORTABLE CHEST 1 VIEW COMPARISON:  01/03/2021.  CT 01/02/2021. FINDINGS: Two right chest tubes in stable position. Minimal residual right base pneumothorax cannot be excluded. Persistent bibasilar atelectasis and infiltrates. Small bilateral pleural effusions with interim progression on the left. Stable cardiomegaly. IMPRESSION: 1. Two right chest tubes in stable position. Minimal residual right base pneumothorax cannot be excluded. 2. Persistent bibasilar atelectasis and infiltrates. Small bilateral pleural effusions with interim progression on the left. Electronically Signed   By: [place]   On: 01/04/2021 06:44        Scheduled Meds: . apixaban  2.5 mg Oral BID  . artificial tears  1 application Left Eye TID  . budesonide (PULMICORT) nebulizer solution  0.5 mg Nebulization BID  . chlorhexidine  15 mL Mouth Rinse BID  . Chlorhexidine Gluconate Cloth  6 each Topical Daily  . digoxin  0.0625 mg Oral Daily  . feeding supplement  237 mL Oral BID BM  . hydrocortisone sod succinate (SOLU-CORTEF) inj  50 mg Intravenous Q8H  . insulin aspart  0-15 Units Subcutaneous TID WC  .  linezolid  600 mg Oral Q12H  . mouth rinse  15 mL Mouth Rinse q12n4p  . midodrine  5 mg Oral Q8H  . potassium chloride  40 mEq Oral Q4H  . sodium chloride flush  10 mL Intracatheter Q8H   Continuous Infusions: . sodium chloride Stopped (01/02/21 1136)  . meropenem (MERREM) IV Stopped (01/05/21 0555)     LOS: 19 days    Time spent: 35 minutes    Irine Seal, MD [facility]   To contact the attending provider between 7A-7P or the covering provider during after hours 7P-7A, please log into the web site [email or web address] and access using universal [name] password for that web site. If you do not have the password, please call the hospital operator.  01/05/2021, 10:04 AM

## 2021-01-05 NOTE — Progress Notes (Addendum)
NAME:  Casey Mongar., MRN:  [id number], DOB:  16-Aug-1926, LOS: 31 ADMISSION DATE:  12/17/2020, CONSULTATION DATE:  1/24 REFERRING MD:  Dr. Darrick Meigs, CHIEF COMPLAINT:  Exudative Pleural Effusion   Brief History:  87 year old male initially admitted 1/16 with HAP and associated right-sided loculated pleural effusion. Recent COVID infection. Patient completed broad-spectrum antibiotic course and despite appropriate therapy developed worsening loculated right pleural effusion and evolution of air/gas fluid level.  Right chest tube was placed and the patient completed intrapleural fibrinolytic therapy.  Past Medical History:  OSA Atrial fibrillation on chronic anticoagulation Chronic pain syndrome  Significant Hospital Events:  1/16 Admit, started on broad spectrum abx 1/17 Thoracentesis with exudative pleural effusion, 262ml hazy amber fluid removed 1/18 AFwRVR, Cardiology consulted 1/19 Completed azithromycin  1/23 Completed ceftriaxone  1/24 PCCM consulted  1/26 R chest tube with intraplural tPA / dornase instilled  1/28 Completed 3rd dose intrapleural fibrinolytic therapy  1/29 [place] O2, afebrile with normal mental status. Labile rhythm / BP on low dose neosynephrine   1/30 [place] O2, cortisol 16.1, on stress dose steroids on neo. On dig/amio for AF.  2/01 Anterior chest tube placed per IR into R lateral superior loculated pleural fluid  2/03 Apical pocket of fluid cleared on CXR, apical chest tube discontinued  Consults:  Cardiology   Procedures:  R Lateral Chest Tube 1/25 (IR) >>  R Anterior Chest Tube 2/1 (IR) >>   Significant Diagnostic Tests:   CT Chest 1/24 >> moderate to large loculated right pleural effusion is increased in volume when compared to 1/16.  New focus of gas with air-fluid level in the posterior compartment of the effusion.  Progressive atelectasis of the entire right lower lobe with partial atelectasis of the right middle lobe and right upper lobe.  Progressive  consolidation involving the posterior medial left lower lobe containing air bronchograms.  CT Chest w/o 2/3 >> stable positon of right pig-tail catheter, right hydropneumothorax, stable small right pleural effusion, stable RML, lower lobe opacities concerning for atelectasis.   Micro Data:  COVID 1/16 >> negative  BCx2 1/16 >> negative  Pleural fluid 1/17 >> negative  MRSA PCR 1/18 >> positive  Chest tube aspirate 1/25 >> negative  BCx2 1/27 >> negative  UC 1/27 >> negative   Antimicrobials:  Azithro 1/16 >> 1/20  Ceftriaxone 1/16 >> 1/23  Cefepime 1/24 >> 1/27  Vanco 1/24 >> 1/31 Meropenem 1/29 >> 2/4 Augmentin 2/4 >>   Interim History / Subjective:  Afebrile  UOP 3.1L / -3.3L in last 24 hours  245 ml out of chest tube   Objective   Blood pressure (!) 96/57, pulse (!) 104, temperature 97.8 F (36.6 C), temperature source Oral, resp. rate (!) 21, height 5\' 11"  (1.803 m), weight 65.1 kg, SpO2 96 %.        Intake/Output Summary (Last 24 hours) at 01/05/2021 1416 Last data filed at 01/05/2021 1240 Gross per 24 hour  Intake 10 ml  Output 3025 ml  Net -3015 ml   Filed Weights   12/26/20 1112 12/29/20 0615 12/31/20 0630  Weight: 62.7 kg 65.4 kg 65.1 kg    Examination: General: frail adult male lying in bed in NAD  HEENT: MM pink/moist, fair dentition Neuro: HOH, awake/alert, oriented, MAE CV: s1s2 irr irr, AF on monitor, no m/r/g PULM: non-labored on RA, lungs bilaterally with fine basilar crackles, R chest tube in position GI: soft, bsx4 active  Extremities: warm/dry, no edema  Skin: no rashes or lesions  Resolved Hospital Problem list      Assessment & Plan:   Acute Hypoxemic Respiratory Failure secondary to HAP with Empyema  Wheezing -resolved. Recent COVID infection.  OSA - does not wear CPAP  -pulmonary hygiene -IS, mobilize  -OOB to chair / PT efforts     Right Empyema secondary to HAP (NOS) 5% Apical Hydropneumothorax Suspect in setting of aspiration.   S/p IR chest tube 1/25, fibrinolytics. Apical CT placed 2/1 for residual loculated empyema  -discontinue basilar chest tube  -note residual hydropneumothorax, will monitor closely  -follow up CXR in am -continue abx, would complete 14 days abx from 2/1 (last chest tube placement) -change abx to augmentin -follow up in pulmonary clinic with repeat imaging post   Septic Shock  Adrenal Insufficiency  Tx to ICU 1/27.  Exam does not match clinically, cortisol 16.1 ?if relative AI was component -defer weaning of midodrine / stress dose steroids to primary  -abx as above   Permanent AF Acute Systolic CHF  Mixed Cardiogenic / Septic Shock  On eliquis, cardizem at home.  New systolic CHF (since last ECHO in 2019). Rates improved after digoxin / amio.  -continue digoxin -oral anticoagulation per pharmacy   Chronic Pain  On PRN gabapentin, Opana at home  -PRN low dose opioids for pain   Acute Metabolic Encephalopathy  Thought secondary to sepsis complicated by hearing loss. Resolved.  -PT efforts / mobilize  Anemia  -trend CBC  -transfuse for Hgb <7%  Hypophosphatemia  -monitor, replace as indicated   Moderate Protein Calorie Malnutrition  -diet as tolerated   Hyperglycemia  -SSI   Urinary Retention Hx Prostate Cancer -continue foley, may need urology input before discharge if not able to urinate w/o foley     Best practice (evaluated daily)  Diet: D3 diet  Pain/Anxiety/Delirium protocol (if indicated): as above  VAP protocol (if indicated): n/a  DVT prophylaxis: Heparin gtt  GI prophylaxis: n/a  Glucose control: SSI  Mobility: As tolerated  Disposition: Per TRH, anticipate he will need SNF.  PCCM will continue to follow.   Goals of Care:  Last date of multidisciplinary goals of care discussion:1/29  Family and staff present: Son, MD Summary of discussion: full medical care but no CPR Follow up goals of care discussion due: 2/5  Code Status: DNR   Son updated at  bedside 2/3 in detail on plan of care.   Labs   CBC: Recent Labs  Lab 12/31/20 0229 01/02/21 0215 01/03/21 0252 01/04/21 0253 01/05/21 0246  WBC 18.0* 10.3 10.5 9.5 9.0  HGB 12.2* 11.0* 13.0 11.7* 11.6*  HCT 39.2 35.9* 41.4 36.9* 36.7*  MCV 90.7 90.0 88.7 86.8 87.2  PLT 461* 449* 390 367 [id number]    Basic Metabolic Panel: Recent Labs  Lab 12/30/20 0219 12/31/20 0229 01/01/21 0226 01/02/21 0215 01/03/21 0252 01/04/21 0253 01/05/21 0246  NA 139 137  --  137 137 136 140  K 3.6 4.1  --  4.3 4.6 4.2 3.2*  CL 110 107  --  107 105 104 104  CO2 23 21*  --  23 25 24 25   GLUCOSE 91 134*  --  115* 109* 126* 111*  BUN 18 19  --  21 22 23 23   CREATININE 0.70 0.84  --  0.70 0.82 0.79 0.91  CALCIUM 7.6* 7.7*  --  7.8* 8.0* 7.8* 7.9*  MG 2.0 2.2  --   --  2.1  --  2.1  PHOS 2.5 2.4* 2.9 2.8 2.9  2.6 2.6   GFR: Estimated Creatinine Clearance: 47.7 mL/min (by C-G formula based on SCr of 0.91 mg/dL). Recent Labs  Lab 12/30/20 0219 12/31/20 0229 01/02/21 0215 01/03/21 0252 01/04/21 0253 01/05/21 0246  PROCALCITON 0.10  --   --   --   --   --   WBC 15.5* 18.0* 10.3 10.5 9.5 9.0  LATICACIDVEN  --  1.3  --   --   --   --     Liver Function Tests: Recent Labs  Lab 12/30/20 0219 12/31/20 0229  AST 35 53*  ALT 27 40  ALKPHOS 68 72  BILITOT 0.4 0.2*  PROT 4.5* 4.8*  ALBUMIN 1.5* 1.7*   No results for input(s): LIPASE, AMYLASE in the last 168 hours. No results for input(s): AMMONIA in the last 168 hours.  ABG    Component Value Date/Time   PHART 7.350 12/28/2020 1640   PCO2ART 48.6 (H) 12/28/2020 1640   PO2ART 63.8 (L) 12/28/2020 1640   HCO3 26.3 12/28/2020 1640   TCO2 22 02/16/2018 0732   O2SAT 91.0 12/28/2020 1640     Coagulation Profile: No results for input(s): INR, PROTIME in the last 168 hours.  Cardiac Enzymes: No results for input(s): CKTOTAL, CKMB, CKMBINDEX, TROPONINI in the last 168 hours.  HbA1C: Hgb A1c MFr Bld  Date/Time Value Ref Range Status   12/30/2020 02:19 AM 5.9 (H) 4.8 - 5.6 % Final    Comment:    (NOTE) Pre diabetes:          5.7%-6.4%  Diabetes:              >6.4%  Glycemic control for   <7.0% adults with diabetes     CBG: Recent Labs  Lab 01/04/21 0721 01/04/21 1124 01/04/21 1633 01/05/21 0743 01/05/21 1157  GLUCAP 117* 116* 122* 107* 144*     Critical care time:       Noe Gens, MSN, APRN, NP-C, AGACNP-BC [facility] 01/05/2021, 2:16 PM   Please see [email or web address] for pager details.   From 7A-7P if no response, please call [phone] After hours, please call Warren Lacy [phone]    Pulmonary critical care attending:  This is a 87 year old gentleman admitted for a right-sided loculated pleural effusion, recent Covid infection.  Patient had right chest drain with intrapleural fibrinolytic therapy.  Also had a right apical section that needed to be drained both were placed by interventional radiology.  Last drain was put in on 01/02/2021.  At this time has remained on antibiotics.  Afebrile on room air.  BP (!) 105/59 (BP Location: Left Arm)   Pulse (!) 113   Temp 97.8 F (36.6 C) (Oral)   Resp (!) 23   Ht 5\' 11"  (1.803 m)   Wt 65.1 kg   SpO2 96%   BMI 20.02 kg/m   General: Elderly male resting in bed Heart: Regular rhythm S1-S2 Lungs: Diminished on the right compared to the left no wheeze  Labs: Reviewed Chest x-ray: Reviewed small 5% pneumothorax on the right stable. Basilar chest drain on the right.  Assessment: Acute hypoxemic respiratory failure secondary to right-sided pneumonia, right-sided empyema, recent Covid infection, septic shock, resolved, PAF, acute systolic heart failure  Plan: Patient already had apical drain removed Patient has a basilar drain still present but it is not in a location that effective at draining the pneumothorax within the apex. Apex pneumothorax appears stable on imaging. We will recommend pulling out the last chest  drain. Would recommend 14  days of antibiotics from 01/02/2019 to start date. De-escalate antimicrobials to Augmentin. Patient to follow-up in pulmonary clinic after discharge 3-4 weeks with a repeat chest x-ray. Would repeat chest x-ray in the a.m. to make sure pneumothorax is not getting larger.   Garner Nash, DO [facility] 01/05/2021 3:06 PM

## 2021-01-06 ENCOUNTER — Inpatient Hospital Stay (HOSPITAL_COMMUNITY): Payer: Medicare PPO

## 2021-01-06 DIAGNOSIS — R339 Retention of urine, unspecified: Secondary | ICD-10-CM

## 2021-01-06 DIAGNOSIS — G8929 Other chronic pain: Secondary | ICD-10-CM

## 2021-01-06 DIAGNOSIS — G9341 Metabolic encephalopathy: Secondary | ICD-10-CM | POA: Diagnosis not present

## 2021-01-06 DIAGNOSIS — I5043 Acute on chronic combined systolic (congestive) and diastolic (congestive) heart failure: Secondary | ICD-10-CM | POA: Diagnosis not present

## 2021-01-06 DIAGNOSIS — I4891 Unspecified atrial fibrillation: Secondary | ICD-10-CM | POA: Diagnosis not present

## 2021-01-06 DIAGNOSIS — C61 Malignant neoplasm of prostate: Secondary | ICD-10-CM

## 2021-01-06 DIAGNOSIS — J9601 Acute respiratory failure with hypoxia: Secondary | ICD-10-CM | POA: Diagnosis not present

## 2021-01-06 LAB — GLUCOSE, CAPILLARY
Glucose-Capillary: 97 mg/dL (ref 70–99)
Glucose-Capillary: 105 mg/dL — ABNORMAL HIGH (ref 70–99)
Glucose-Capillary: 100 mg/dL — ABNORMAL HIGH (ref 70–99)
Glucose-Capillary: 128 mg/dL — ABNORMAL HIGH (ref 70–99)
Glucose-Capillary: 110 mg/dL — ABNORMAL HIGH (ref 70–99)

## 2021-01-06 LAB — CBC WITH DIFFERENTIAL/PLATELET
Abs Immature Granulocytes: 0.15 10*3/uL — ABNORMAL HIGH (ref 0.00–0.07)
Basophils Absolute: 0 10*3/uL (ref 0.0–0.1)
Basophils Relative: 0 %
Eosinophils Absolute: 0 10*3/uL (ref 0.0–0.5)
Eosinophils Relative: 0 %
HCT: 36.4 % — ABNORMAL LOW (ref 39.0–52.0)
Hemoglobin: 11.4 g/dL — ABNORMAL LOW (ref 13.0–17.0)
Immature Granulocytes: 1 %
Lymphocytes Relative: 7 %
Lymphs Abs: 0.9 10*3/uL (ref 0.7–4.0)
MCH: 27.9 pg (ref 26.0–34.0)
MCHC: 31.3 g/dL (ref 30.0–36.0)
MCV: 89 fL (ref 80.0–100.0)
Monocytes Absolute: 0.6 10*3/uL (ref 0.1–1.0)
Monocytes Relative: 5 %
Neutro Abs: 11 10*3/uL — ABNORMAL HIGH (ref 1.7–7.7)
Neutrophils Relative %: 87 %
Platelets: 364 10*3/uL (ref 150–400)
RBC: 4.09 MIL/uL — ABNORMAL LOW (ref 4.22–5.81)
RDW: 14.9 % (ref 11.5–15.5)
WBC: 12.7 10*3/uL — ABNORMAL HIGH (ref 4.0–10.5)
nRBC: 0 % (ref 0.0–0.2)

## 2021-01-06 LAB — BASIC METABOLIC PANEL
Anion gap: 6 (ref 5–15)
BUN: 24 mg/dL — ABNORMAL HIGH (ref 8–23)
CO2: 26 mmol/L (ref 22–32)
Calcium: 8.1 mg/dL — ABNORMAL LOW (ref 8.9–10.3)
Chloride: 109 mmol/L (ref 98–111)
Creatinine, Ser: 0.96 mg/dL (ref 0.61–1.24)
GFR, Estimated: >60 mL/min (ref >60)
Glucose, Bld: 123 mg/dL — ABNORMAL HIGH (ref 70–99)
Potassium: 3.9 mmol/L (ref 3.5–5.1)
Sodium: 141 mmol/L (ref 135–145)

## 2021-01-06 LAB — PHOSPHORUS: Phosphorus: 1.8 mg/dL — ABNORMAL LOW (ref 2.5–4.6)

## 2021-01-06 LAB — MAGNESIUM: Magnesium: 2.2 mg/dL (ref 1.7–2.4)

## 2021-01-06 LAB — T3, FREE: T3, Free: 1 pg/mL — ABNORMAL LOW (ref 2.0–4.4)

## 2021-01-06 MED ORDER — LOPERAMIDE HCL 2 MG PO CAPS
2.0000 mg | ORAL_CAPSULE | ORAL | Status: DC | PRN
  Administered 2021-01-06 – 2021-01-09 (×6): 2 mg via ORAL
  Filled 2021-01-06 (×6): qty 1

## 2021-01-06 MED ORDER — HYDROCORTISONE NA SUCCINATE PF 100 MG IJ SOLR
50.0000 mg | Freq: Every day | INTRAMUSCULAR | Status: DC
  Administered 2021-01-07: 50 mg via INTRAVENOUS
  Filled 2021-01-06: qty 2

## 2021-01-06 MED ORDER — K PHOS MONO-SOD PHOS DI & MONO 155-852-130 MG PO TABS
250.0000 mg | ORAL_TABLET | Freq: Three times a day (TID) | ORAL | Status: AC
  Administered 2021-01-06 – 2021-01-08 (×8): 250 mg via ORAL
  Filled 2021-01-06 (×9): qty 1

## 2021-01-06 MED ORDER — POTASSIUM PHOSPHATES 15 MMOLE/5ML IV SOLN
30.0000 mmol | Freq: Once | INTRAVENOUS | Status: AC
  Administered 2021-01-06: 30 mmol via INTRAVENOUS
  Filled 2021-01-06: qty 10

## 2021-01-06 MED ORDER — MIDODRINE HCL 5 MG PO TABS
5.0000 mg | ORAL_TABLET | Freq: Two times a day (BID) | ORAL | Status: DC
Start: 2021-01-06 — End: 2021-01-07
  Administered 2021-01-06 – 2021-01-07 (×2): 5 mg via ORAL
  Filled 2021-01-06 (×2): qty 1

## 2021-01-06 NOTE — Progress Notes (Signed)
PROGRESS NOTE    Casey Gates.  KYH:[id number] DOB: 1926-05-19 DOA: 12/17/2020 PCP: Velna Hatchet, MD    Chief Complaint  Patient presents with  . Urinary Tract Infection    Brief Narrative:  Patient 87 year old gentleman initially admitted 12/17/2020 with pneumonia and associated right-sided loculated pleural effusion.  Patient completed full course of antibiotics however continued to have worsening loculated right pleural effusion with evolution of air/gas fluid level.  Right chest tube placed and patient completed intrapleural fibrinolytic therapy. Patient subsequently noted to go into septic shock with concerns for mild adrenal insufficiency and transferred to the ICU and subsequently placed on pressors as well as stress dose steroids.  Patient had a subsequent second chest tube placed by IR in the right lateral superior loculated pleural fluid.  Patient also on midodrine.  Patient also noted to go into A. fib with RVR cardiology consulted patient initially on amiodarone due to shock and subsequently transition to digoxin.  Cardiology following. Pressors weaned off.  Patient subsequently transferred to [facility].  PCCM following for chest tube management.   Assessment & Plan:   Principal Problem:   Acute respiratory failure with hypoxia (HCC) Active Problems:   Multifocal pneumonia   Chronic pain   Urinary retention   Septic shock (HCC)   Prostate cancer (HCC)   Atrial fibrillation with RVR (HCC)   Acute metabolic encephalopathy   Hypernatremia   Empyema (HCC)   DNR (do not resuscitate)   Goals of care, counseling/discussion   Shock circulatory ([place])   Large pleural effusion   Acute on chronic combined systolic and diastolic CHF (congestive heart failure) ([place])   1 acute hypoxemic respiratory failure secondary to pneumonia with empyema/OSA/right-sided pneumothorax Patient with clinical improvement.  Currently on room air.  Status post chest tube placement x2.   Apical chest tube discontinued 01/04/2021.  Right chest tube discontinued 01/05/2021.  CT chest done concerning for increased right hydropneumothorax, stable moderate left pleural effusion, stable right middle lobe and lower lobe opacities, interval development of subcutaneous emphysema involving right posterior chest wall.  Chest x-ray from today with persistent 5 to 10% right-sided pneumothorax which is stable.  Patient denies any significant shortness of breath.  Continue pulmonary hygiene, mobilization.  Continue Pulmicort.  IV antibiotics have been transitioned to oral antibiotics of Augmentin.  PT/OT following.  Per PCCM.  2.  Right empyema secondary to PNA Concern that patient's right empyema may be in the setting of aspiration. Status post chest tube placement per IR 12/26/2020 with completion of fibrinolytics.  Patient with apical chest tube placed to 12/21/2020 for residual loculated empyema.  Apical chest tube discontinued 01/04/2021.  Right chest tube discontinued 01/05/2021 per PCCM.  CT chest (01/04/2021) with stable position right-sided pigtail drainage catheter seen in right hemithorax, significantly increased amount of air noted in the upper lung zone consistent with increased right hydropneumothorax, stable right small pleural effusion.  Stable moderate left pleural effusion noted with associated atelectasis or infiltrate in the left lower lobe, stable right middle lobe and lower lobe opacities noted concerning for atelectasis or infiltrates, interval development of subcutaneous emphysema involving right posterior chest wall.  Repeat chest x-ray this morning with persistent 5 to 10% right-sided pneumothorax which has been stable. IV antibiotics have been transitioned to oral Augmentin with PCCM recommending 14-day course of antibiotics, through 01/16/2021.  PCCM recommending repeat CT chest on 01/16/2021 and if still residual disease/persistent signs of infection, no unresolved infiltrates will need an  extra week of Augmentin.  3.  Septic shock/adrenal insufficiency Patient was transferred to the ICU 12/28/2018.  Has noted to be in septic shock requiring pressors.  Patient also placed on stress dose steroids.  Cortisol level noted at 16.1.  Patient currently on midodrine and IV Solu-Cortef.  Systolic blood pressure this morning in the 130s.  Was on IV Solu-Cortef 50 mg every 12 hours will decrease to daily.  Midodrine has been decreased to twice daily per cardiology.  Was on IV antibiotics and has been transitioned to oral antibiotics.  Follow.  4.  Persistent atrial fibrillation Patient initially was on amiodarone for management of A. fib as patient also noted to be in septic shock.  Amiodarone discontinued and patient placed on digoxin for better rate control.  Heart rate improved.  Blood pressure improved on current regimen of midodrine and IV Solu-Cortef. Diltiazem currently on hold.  IV Solu-Cortef currently 50 mg every 12 hours.  Decrease IV Solu-Cortef to 50 mg daily. Also cardiology preferring to wean patient off midodrine to add metoprolol.  Midodrine decreased to twice daily per cardiology.  Was on IV heparin and has been transitioned to Eliquis as of 01/04/2021 as per PCCM no further indications for procedures at this time.  Replete potassium keep potassium approximately 4.0.  Keep magnesium > 2.  Cardiology following.   5.  Acute systolic and diastolic heart failure/mixed cardiogenic shock 2D echo done with EF of 40 to 45% down from 65 to 70%.  Patient required pressors early on during the hospitalization which have subsequently been weaned off.  Urine output of 850 cc over the past 24 hours.  Patient is -2.33 L during this hospitalization.  Blood pressure currently stable with systolic in the [id number] on midodrine.  Patient also on stress dose steroids which will decrease to once daily.  Continue digoxin. Per cardiology.    6.  Acute metabolic encephalopathy Secondary to sepsis.  Resolved.   Follow.  7.  Hypophosphatemia/hypokalemia Phosphorus at 1.8 this morning.  K-Phos 30 mmol IV x1.  Oral phosphorus also ordered early on this morning which we will continue.  Potassium at 3.9.  Follow.  8.  Chronic pain Stable.  Patient noted to be on Opana and Neurontin as needed prior to admission.  Continue oxycodone 5 mg p.o. every 6 hours as needed pain.  9.  Moderate protein calorie malnutrition Continue current dysphagia 3 diet.  Follow.  10.  History of prostate cancer/urinary retention Foley catheter replaced on 01/03/2021.  Urine output of 850 cc recorded over the past 24 hours.  Voiding trial 01/08/2021.  If patient fails voiding trial will need to place Foley catheter back in an outpatient follow-up with urology.   11.  Anemia Patient with no overt bleeding.  Hemoglobin stable at 11.4.  Monitor with anticoagulation.    DVT prophylaxis: Heparin>>>> Eliquis Code Status: DNR Family Communication: Updated patient and son at bedside Disposition:   Status is: Inpatient    Dispo: The patient is from: ALF              Anticipated d/c is to: SNF              Anticipated d/c date is: To be determined.              Patient currently on midodrine, IV stress dose steroids, antibiotics, chest tubes x1, not stable for discharge.   Difficult to place patient undetermined       Consultants:   PCCM: Dr. Chase Caller 12/25/2020  Cardiology: Dr.  Pemberton 12/19/2020  Significant Hospital Events:  1/16 Admit, started on broad spectrum abx 1/17 Thoracentesis with exudative pleural effusion, 22ml hazy amber fluid removed 1/18 [facility], Cardiology consulted 1/19 Completed azithromycin  1/23 Completed ceftriaxone  1/24 PCCM consulted  1/26 R chest tube with intraplural tPA / dornase instilled  1/28 Completed 3rd dose intrapleural fibrinolytic therapy  1/29 [place] O2, afebrile with normal mental status. Labile rhythm / BP on low dose neosynephrine   1/30 [place] O2, cortisol 16.1, on stress dose  steroids on neo. On dig/amio for AF.  2/01 Anterior chest tube placed per IR into R lateral superior loculated pleural fluid  2/03 Apical pocket of fluid cleared on CXR, apical chest tube discontinued 2/04 persistent 5- 10% right-sided pneumothorax 2/4/right chest tube removed     Procedures:   CT Chest 1/24 >> moderate to large loculated right pleural effusion is increased in volume when compared to 1/16.  New focus of gas with air-fluid level in the posterior compartment of the effusion.  Progressive atelectasis of the entire right lower lobe with partial atelectasis of the right middle lobe and right upper lobe.  Progressive consolidation involving the posterior medial left lower lobe containing air bronchograms. CT Chest w/o 2/3 >> 1. Stable position of right-sided pigtail drainage catheter seen in right hemithorax. Significantly increased amount of air is noted in the upper lung zone consistent with increased right hydropneumothorax. Stable small right pleural effusion is noted. 2. Stable moderate left pleural effusion is noted with associated atelectasis or infiltrate of the left lower lobe. 3. Stable right middle lobe and lower lobe opacities are noted concerning for atelectasis or infiltrates. 4. Interval development of subcutaneous emphysema involving the right posterior chest wall. 5. Coronary artery calcifications are noted. 6. 4.3 cm ascending thoracic aortic aneurysm is noted.  7. Aortic atherosclerosis.  R lateral chest tube 1/25 per IR>>>  Right anterior chest tube 2/1 >>>    Antimicrobials:  Azithro 1/16 >> 1/20  Ceftriaxone 1/16 >> 1/23  Cefepime 1/24 >> 1/27  Vanco 1/24 >> 1/31 Meropenem 1/29 >>  2/4 Zyvox 1/31>> 2/4 Augmentin 01/05/2021>>> 01/16/2021  Micro Data:  COVID 1/16 >> negative  BCx2 1/16 >> negative  Pleural fluid 1/17 >> negative  MRSA PCR 1/18 >> positive  Chest tube aspirate 1/25 >> negative  BCx2 1/27 >> negative  UC 1/27 >> negative      Subjective: Patient sitting up in bed.  Denies any chest pain.  No shortness of breath.  No nausea.  No emesis.  No abdominal pain.  Systolic blood pressures in the 130s.  Per RN patient with multiple soft mushy stools today.  Son at bedside.  Objective: Vitals:   01/06/21 0400 01/06/21 0600 01/06/21 0800 01/06/21 1000  BP: 131/65 122/69 137/64 122/65  Pulse: 79 78 69 88  Resp: 19 (!) 9 18 19   Temp: 97.9 F (36.6 C)  97.7 F (36.5 C)   TempSrc: Axillary  Oral   SpO2: 97% 95% 97% 97%  Weight:      Height:        Intake/Output Summary (Last 24 hours) at 01/06/2021 1018 Last data filed at 01/06/2021 [id number] Gross per 24 hour  Intake 10 ml  Output 920 ml  Net -910 ml   Filed Weights   12/26/20 1112 12/29/20 0615 12/31/20 0630  Weight: 62.7 kg 65.4 kg 65.1 kg    Examination:  General exam: NAD Respiratory system: Some decreased breath sounds in the bases.  No wheezing.  No  crackles.  Fair air movement.  Cardiovascular system: Irregularly irregular.  No JVD.  No lower extremity edema.  Gastrointestinal system: Abdomen is soft, nontender, nondistended, positive bowel sounds.  No rebound.  No guarding. Central nervous system: Alert and oriented. No focal neurological deficits. Extremities: Symmetric 5 x 5 power. Skin: No rashes, lesions or ulcers Psychiatry: Judgement and insight appear normal. Mood & affect appropriate.     Data Reviewed: I have personally reviewed following labs and imaging studies  CBC: Recent Labs  Lab 01/02/21 0215 01/03/21 0252 01/04/21 0253 01/05/21 0246 01/06/21 0241  WBC 10.3 10.5 9.5 9.0 12.7*  NEUTROABS  --   --   --   --  11.0*  HGB 11.0* 13.0 11.7* 11.6* 11.4*  HCT 35.9* 41.4 36.9* 36.7* 36.4*  MCV 90.0 88.7 86.8 87.2 89.0  PLT 449* 390 367 374 540    Basic Metabolic Panel: Recent Labs  Lab 12/31/20 0229 01/01/21 0226 01/02/21 0215 01/03/21 0252 01/04/21 0253 01/05/21 0246 01/06/21 0241  NA 137  --  137 137 136 140 141  K  4.1  --  4.3 4.6 4.2 3.2* 3.9  CL 107  --  107 105 104 104 109  CO2 21*  --  23 25 24 25 26   GLUCOSE 134*  --  115* 109* 126* 111* 123*  BUN 19  --  21 22 23 23  24*  CREATININE 0.84  --  0.70 0.82 0.79 0.91 0.96  CALCIUM 7.7*  --  7.8* 8.0* 7.8* 7.9* 8.1*  MG 2.2  --   --  2.1  --  2.1 2.2  PHOS 2.4*   < > 2.8 2.9 2.6 2.6 1.8*   < > = values in this interval not displayed.    GFR: Estimated Creatinine Clearance: 45.2 mL/min (by C-G formula based on SCr of 0.96 mg/dL).  Liver Function Tests: Recent Labs  Lab 12/31/20 0229  AST 53*  ALT 40  ALKPHOS 72  BILITOT 0.2*  PROT 4.8*  ALBUMIN 1.7*    CBG: Recent Labs  Lab 01/05/21 0743 01/05/21 1157 01/05/21 1631 01/05/21 2124 01/06/21 0727  GLUCAP 107* 144* 150* 151* 97     Recent Results (from the past 240 hour(s))  Culture, blood (Routine X 2) w Reflex to ID Panel     Status: None   Collection Time: 12/28/20  9:17 AM   Specimen: BLOOD  Result Value Ref Range Status   Specimen Description   Final    BLOOD BLOOD RIGHT FOREARM Performed at [facility] [address], [place], [place] [address]    Special Requests   Final    BOTTLES DRAWN AEROBIC ONLY Blood Culture adequate volume Performed at [facility] [address] [place], [place] [address]    Culture   Final    NO GROWTH 5 DAYS Performed at [facility], [place] [address] [place], [place] [address]    Report Status 01/02/2021 FINAL  Final  Culture, blood (Routine X 2) w Reflex to ID Panel     Status: None   Collection Time: 12/28/20  9:19 AM   Specimen: BLOOD RIGHT HAND  Result Value Ref Range Status   Specimen Description   Final    BLOOD RIGHT HAND Performed at [facility] [address], [place], [place] [address]    Special Requests   Final    BOTTLES DRAWN AEROBIC ONLY Blood Culture adequate volume Performed at [facility], [address] [place], [place]  [address]    Culture   Final    NO GROWTH 5 DAYS Performed at [facility], [place] [address] [place], [place] [address]    Report Status 01/02/2021 FINAL  Final  Culture, Urine     Status: None   Collection Time: 12/28/20  9:34 AM   Specimen: Urine, Catheterized  Result Value Ref Range Status   Specimen Description   Final    URINE, CATHETERIZED Performed at [facility] [address], [place], [place] [address]    Special Requests   Final    NONE Performed at [facility], [place] [address], [place], [place] [address]    Culture   Final    NO GROWTH Performed at [facility], [place] [address] [place], [place] [address]    Report Status 12/29/2020 FINAL  Final  Culture, blood (Routine X 2) w Reflex to ID Panel     Status: None   Collection Time: 12/28/20  5:20 PM   Specimen: BLOOD LEFT HAND  Result Value Ref Range Status   Specimen Description   Final    BLOOD LEFT HAND Performed at [facility] [address], [place], [place] [address]    Special Requests   Final    BOTTLES DRAWN AEROBIC ONLY Blood Culture adequate volume Performed at [facility] [address] [place], [place] [address]    Culture   Final    NO GROWTH 5 DAYS Performed at [facility], [place] [address] [place], [place] [address]    Report Status 01/03/2021 FINAL  Final  Culture, blood (Routine X 2) w Reflex to ID Panel     Status: None   Collection Time: 12/28/20  5:20 PM   Specimen: BLOOD  Result Value Ref Range Status   Specimen Description   Final    BLOOD RIGHT ANTECUBITAL Performed at [facility] [address] [place], [place] [address]    Special Requests   Final    BOTTLES DRAWN AEROBIC ONLY Blood Culture adequate volume Performed at [facility] [address], [place], [place] [address]    Culture   Final    NO GROWTH 5 DAYS Performed at [facility], [place] [address] [place], [place] [address]    Report Status 01/03/2021 FINAL  Final         Radiology Studies: CT CHEST WO CONTRAST  Result Date: 01/04/2021 CLINICAL DATA:  Right pleural effusion. EXAM: CT CHEST WITHOUT CONTRAST TECHNIQUE: Multidetector CT imaging of the chest was performed following the standard protocol without IV contrast. COMPARISON:  January 01, 2021. FINDINGS: Cardiovascular: 4.3 cm ascending thoracic aortic aneurysm is noted. Atherosclerosis of thoracic aorta is noted. Coronary artery calcifications are noted. Normal cardiac size. No pericardial effusion. Mediastinum/Nodes: No enlarged mediastinal or axillary lymph nodes. Thyroid gland, trachea, and esophagus demonstrate no significant findings. Lungs/Pleura: Stable position of right-sided pigtail drainage catheter seen in right hemithorax. Stable small right pleural effusion is noted. Significantly increased amount of air is noted in upper lung zone consistent with increased right hydropneumothorax. Stable moderate left pleural effusion is noted with associated atelectasis or infiltrate of the left lower lobe. Stable right middle lobe and lower lobe opacities are noted concerning for atelectasis or infiltrates. Upper Abdomen: No acute abnormality. Musculoskeletal: No significant osseous abnormality is noted. There is interval development of subcutaneous emphysema involving the right posterior  chest wall. IMPRESSION: 1. Stable position of right-sided pigtail drainage catheter seen in right hemithorax. Significantly increased amount of air is noted in the upper lung zone consistent with increased right hydropneumothorax. Stable small right pleural effusion is noted. 2. Stable moderate left pleural effusion is noted with associated atelectasis or infiltrate of the left lower lobe. 3. Stable right middle lobe and lower lobe opacities are noted concerning for atelectasis or infiltrates. 4. Interval development of subcutaneous  emphysema involving the right posterior chest wall. 5. Coronary artery calcifications are noted. 6. 4.3 cm ascending thoracic aortic aneurysm is noted. 7. Aortic atherosclerosis. Aortic Atherosclerosis (ICD10-I70.0). Electronically Signed   By: Marijo Conception M.D.   On: 01/04/2021 14:45   DG Chest Port 1 View  Result Date: 01/06/2021 CLINICAL DATA:  Empyema. EXAM: PORTABLE CHEST 1 VIEW COMPARISON:  One-view chest x-ray 01/05/2021 FINDINGS: Heart is mildly enlarged. Atherosclerotic changes are noted at the arch. Left pleural effusion is present. A right hydropneumothorax remains following removal of the right-sided chest tube. There is no significant interval change. Pneumothorax measures 10-15%. Airspace opacities at the right base have increased some. IMPRESSION: 1. Stable right hydropneumothorax following removal of the right-sided chest tube. 2. Increasing airspace disease at the right base. This may represent atelectasis or inflammation associated with the previous chest tube. 3. Stable left pleural effusion without pneumothorax. Electronically Signed   By: [place] Morelle M.D.   On: 01/06/2021 06:19   DG Chest Port 1 View  Result Date: 01/05/2021 CLINICAL DATA:  Follow-up empyema. EXAM: PORTABLE CHEST 1 VIEW COMPARISON:  Chest x-ray and chest CT 01/04/2021 FINDINGS: The upper right chest tube has been removed. The lower chest tube is still in place. There is a small persistent pneumothorax estimated at 5-10%. Stable small bilateral pleural effusions and bibasilar atelectasis. IMPRESSION: Persistent 5-10% right-sided pneumothorax (when compared to the prior chest CT). Electronically Signed   By: Marijo Sanes M.D.   On: 01/05/2021 08:19        Scheduled Meds: . amoxicillin-clavulanate  1 tablet Oral Q12H  . apixaban  2.5 mg Oral BID  . artificial tears  1 application Left Eye TID  . budesonide (PULMICORT) nebulizer solution  0.5 mg Nebulization BID  . chlorhexidine  15 mL Mouth Rinse BID   . Chlorhexidine Gluconate Cloth  6 each Topical Daily  . digoxin  0.0625 mg Oral Daily  . feeding supplement  237 mL Oral BID BM  . hydrocortisone sod succinate (SOLU-CORTEF) inj  50 mg Intravenous Q12H  . insulin aspart  0-15 Units Subcutaneous TID WC  . mouth rinse  15 mL Mouth Rinse q12n4p  . midodrine  5 mg Oral BID WC  . phosphorus  250 mg Oral TID  . sodium chloride flush  10 mL Intracatheter Q8H   Continuous Infusions: . sodium chloride Stopped (01/02/21 1136)  . potassium PHOSPHATE IVPB (in mmol) 30 mmol (01/06/21 1014)     LOS: 20 days    Time spent: 40 minutes    Irine Seal, MD [facility]   To contact the attending provider between 7A-7P or the covering provider during after hours 7P-7A, please log into the web site [email or web address] and access using universal [place] password for that web site. If you do not have the password, please call the hospital operator.  01/06/2021, 10:18 AM

## 2021-01-06 NOTE — Progress Notes (Signed)
NAME:  Casey Meeler., MRN:  [id number], DOB:  [birth year], LOS: 35 ADMISSION DATE:  12/17/2020, CONSULTATION DATE:  1/24 REFERRING MD:  Dr. Darrick Meigs, CHIEF COMPLAINT:  Exudative Pleural Effusion   Brief History:  87 year old male initially admitted 1/16 with HAP and associated right-sided loculated pleural effusion. Recent COVID infection. Patient completed broad-spectrum antibiotic course and despite appropriate therapy developed worsening loculated right pleural effusion and evolution of air/gas fluid level.  Right chest tube was placed and the patient completed intrapleural fibrinolytic therapy.  Past Medical History:  OSA Atrial fibrillation on chronic anticoagulation Chronic pain syndrome  Significant Hospital Events:  1/16 Admit, started on broad spectrum abx 1/17 Thoracentesis with exudative pleural effusion, 222ml hazy amber fluid removed 1/18 AFwRVR, Cardiology consulted 1/19 Completed azithromycin  1/23 Completed ceftriaxone  1/24 PCCM consulted  1/26 R chest tube with intraplural tPA / dornase instilled  1/28 Completed 3rd dose intrapleural fibrinolytic therapy  1/29 [place] O2, afebrile with normal mental status. Labile rhythm / BP on low dose neosynephrine   1/30 [place] O2, cortisol 16.1, on stress dose steroids on neo. On dig/amio for AF.  2/01 Anterior chest tube placed per IR into R lateral superior loculated pleural fluid  2/03 Apical pocket of fluid cleared on CXR, apical chest tube discontinued  Consults:  Cardiology   Procedures:  R Lateral Chest Tube 1/25 (IR) >>  R Anterior Chest Tube 2/1 (IR) >>   Significant Diagnostic Tests:   CT Chest 1/24 >> moderate to large loculated right pleural effusion is increased in volume when compared to 1/16.  New focus of gas with air-fluid level in the posterior compartment of the effusion.  Progressive atelectasis of the entire right lower lobe with partial atelectasis of the right middle lobe and right upper lobe.  Progressive  consolidation involving the posterior medial left lower lobe containing air bronchograms.  CT Chest w/o 2/3 >> stable positon of right pig-tail catheter, right hydropneumothorax, stable small right pleural effusion, stable RML, lower lobe opacities concerning for atelectasis.   Micro Data:  COVID 1/16 >> negative  BCx2 1/16 >> negative  Pleural fluid 1/17 >> negative  MRSA PCR 1/18 >> positive  Chest tube aspirate 1/25 >> negative  BCx2 1/27 >> negative  UC 1/27 >> negative   Antimicrobials:  Azithro 1/16 >> 1/20  Ceftriaxone 1/16 >> 1/23  Cefepime 1/24 >> 1/27  Vanco 1/24 >> 1/31 Meropenem 1/29 >> 2/4 Augmentin 2/4 >>   Interim History / Subjective:   cxr reviewed from this AM. Stable small hydropneumothorax.   Objective   Blood pressure 137/64, pulse 69, temperature 97.7 F (36.5 C), temperature source Oral, resp. rate 18, height 5\' 11"  (1.803 m), weight 65.1 kg, SpO2 97 %.        Intake/Output Summary (Last 24 hours) at 01/06/2021 [id number] Last data filed at 01/06/2021 [id number] Gross per 24 hour  Intake 10 ml  Output 920 ml  Net -910 ml   Filed Weights   12/26/20 1112 12/29/20 0615 12/31/20 0630  Weight: 62.7 kg 65.4 kg 65.1 kg    Examination: General: frail elderly male, resting bed  HEENT: MM dry  Neuro: alert, following commands CV: s1 s2 irregular PULM: diminshed in the bases, no wheeze  GI: soft nt nd  Extremities: no edema  Skin: no rash   Resolved Hospital Problem list      Assessment & Plan:   Right Empyema secondary to HAP (NOS) 5% Apical Hydropneumothorax Suspect in setting of aspiration.  S/p IR chest tube 1/25, fibrinolytics. Apical CT placed 2/1 for residual loculated empyema  Plan:  Would follow the loculated the remains small loculated hydropneumothorax  At his age I would not do anymore at this time except conservative measures  Would complete 14 days of abx Augmentin stop date 01/16/2021 Repeat CT CHEST WO CONTRAST on 01/16/2021 If there is  persistent signs of infection, non-resolved infiltrates would lengthen abx course by 1 additional week.  Can follow up in pulmonary clinic upon discharge for outpatient imaging if needed  Call us for questions   Acute Hypoxemic Respiratory Failure secondary to HAP with Empyema  Wheezing -resolved. Recent COVID infection.  OSA - does not wear CPAP  -Plan: Mobility  IS  OOB   Septic Shock  Adrenal Insufficiency  - resolved   Permanent AF Acute Systolic CHF  Mixed Cardiogenic / Septic Shock  - eliquis, dig, amio   Chronic Pain  On PRN gabapentin, Opana at home   Acute Metabolic Encephalopathy  Thought secondary to sepsis complicated by hearing loss. Resolved.   Anemia  -conservative transfusion threshold   Hypophosphatemia  - replete as needed  - encourage PO intake   Moderate Protein Calorie Malnutrition  -diet as tolerated   Hyperglycemia  -SSI   Urinary Retention Hx Prostate Cancer -supportive care, per primary    PCCM will sign off. Please call with any questions     Best practice (evaluated daily)  Diet: D3 diet  Pain/Anxiety/Delirium protocol (if indicated): as above  VAP protocol (if indicated): n/a  DVT prophylaxis: Heparin gtt  GI prophylaxis: n/a  Glucose control: SSI  Mobility: As tolerated  Disposition: PCCM sign off   Goals of Care:  Last date of multidisciplinary goals of care discussion:1/29  Family and staff present: Son, MD Summary of discussion: full medical care but no CPR Follow up goals of care discussion due: 2/5  Code Status: DNR   Son updated at bedside 2/3 in detail on plan of care.   Labs   CBC: Recent Labs  Lab 01/02/21 0215 01/03/21 0252 01/04/21 0253 01/05/21 0246 01/06/21 0241  WBC 10.3 10.5 9.5 9.0 12.7*  NEUTROABS  --   --   --   --  11.0*  HGB 11.0* 13.0 11.7* 11.6* 11.4*  HCT 35.9* 41.4 36.9* 36.7* 36.4*  MCV 90.0 88.7 86.8 87.2 89.0  PLT 449* 390 367 374 664    Basic Metabolic Panel: Recent Labs   Lab 12/31/20 0229 01/01/21 0226 01/02/21 0215 01/03/21 0252 01/04/21 0253 01/05/21 0246 01/06/21 0241  NA 137  --  137 137 136 140 141  K 4.1  --  4.3 4.6 4.2 3.2* 3.9  CL 107  --  107 105 104 104 109  CO2 21*  --  23 25 24 25 26   GLUCOSE 134*  --  115* 109* 126* 111* 123*  BUN 19  --  21 22 23 23  24*  CREATININE 0.84  --  0.70 0.82 0.79 0.91 0.96  CALCIUM 7.7*  --  7.8* 8.0* 7.8* 7.9* 8.1*  MG 2.2  --   --  2.1  --  2.1 2.2  PHOS 2.4*   < > 2.8 2.9 2.6 2.6 1.8*   < > = values in this interval not displayed.   GFR: Estimated Creatinine Clearance: 45.2 mL/min (by C-G formula based on SCr of 0.96 mg/dL). Recent Labs  Lab 12/31/20 0229 01/02/21 0215 01/03/21 0252 01/04/21 0253 01/05/21 0246 01/06/21 0241  WBC 18.0*   < >  10.5 9.5 9.0 12.7*  LATICACIDVEN 1.3  --   --   --   --   --    < > = values in this interval not displayed.    Liver Function Tests: Recent Labs  Lab 12/31/20 0229  AST 53*  ALT 40  ALKPHOS 72  BILITOT 0.2*  PROT 4.8*  ALBUMIN 1.7*   No results for input(s): LIPASE, AMYLASE in the last 168 hours. No results for input(s): AMMONIA in the last 168 hours.  ABG    Component Value Date/Time   PHART 7.350 12/28/2020 1640   PCO2ART 48.6 (H) 12/28/2020 1640   PO2ART 63.8 (L) 12/28/2020 1640   HCO3 26.3 12/28/2020 1640   TCO2 22 02/16/2018 0732   O2SAT 91.0 12/28/2020 1640     Coagulation Profile: No results for input(s): INR, PROTIME in the last 168 hours.  Cardiac Enzymes: No results for input(s): CKTOTAL, CKMB, CKMBINDEX, TROPONINI in the last 168 hours.  HbA1C: Hgb A1c MFr Bld  Date/Time Value Ref Range Status  12/30/2020 02:19 AM 5.9 (H) 4.8 - 5.6 % Final    Comment:    (NOTE) Pre diabetes:          5.7%-6.4%  Diabetes:              >6.4%  Glycemic control for   <7.0% adults with diabetes     CBG: Recent Labs  Lab 01/05/21 0743 01/05/21 1157 01/05/21 1631 01/05/21 2124 01/06/21 0727  GLUCAP 107* 144* 150* 151* 97      Garner Nash, DO [facility] 01/06/2021 8:32 AM

## 2021-01-06 NOTE — Progress Notes (Signed)
Progress Note  Patient Name: Casey Gates. Date of Encounter: 01/06/2021  [facility] Cardiologist: Larae Grooms, MD   Subjective   Resting comfortably. No chest pain/sob per son.  He has been eating and drinking well.   Inpatient Medications    Scheduled Meds: . amoxicillin-clavulanate  1 tablet Oral Q12H  . apixaban  2.5 mg Oral BID  . artificial tears  1 application Left Eye TID  . budesonide (PULMICORT) nebulizer solution  0.5 mg Nebulization BID  . chlorhexidine  15 mL Mouth Rinse BID  . Chlorhexidine Gluconate Cloth  6 each Topical Daily  . digoxin  0.0625 mg Oral Daily  . feeding supplement  237 mL Oral BID BM  . hydrocortisone sod succinate (SOLU-CORTEF) inj  50 mg Intravenous Q12H  . insulin aspart  0-15 Units Subcutaneous TID WC  . mouth rinse  15 mL Mouth Rinse q12n4p  . midodrine  5 mg Oral Q8H  . phosphorus  250 mg Oral TID  . sodium chloride flush  10 mL Intracatheter Q8H   Continuous Infusions: . sodium chloride Stopped (01/02/21 1136)  . potassium PHOSPHATE IVPB (in mmol)     PRN Meds: fentaNYL (SUBLIMAZE) injection, ipratropium-albuterol, lidocaine (PF), lip balm, metoprolol tartrate, ondansetron **OR** ondansetron (ZOFRAN) IV, oxyCODONE, Resource ThickenUp Clear   Vital Signs    Vitals:   01/06/21 0200 01/06/21 0400 01/06/21 0600 01/06/21 0800  BP: 131/68 131/65 122/69 137/64  Pulse: 82 79 78 69  Resp: (!) 8 19 (!) 9 18  Temp:  97.9 F (36.6 C)  97.7 F (36.5 C)  TempSrc:  Axillary  Oral  SpO2: 97% 97% 95% 97%  Weight:      Height:        Intake/Output Summary (Last 24 hours) at 01/06/2021 0842 Last data filed at 01/06/2021 [id number] Gross per 24 hour  Intake 10 ml  Output 920 ml  Net -910 ml   Last 3 Weights 12/31/2020 12/29/2020 12/26/2020  Weight (lbs) 143 lb 8.3 oz 144 lb 2.9 oz 138 lb 3.7 oz  Weight (kg) 65.1 kg 65.4 kg 62.7 kg      Telemetry    Atrial fibrillation rates in the 80's- Personally Reviewed  ECG    n/a -  Personally Reviewed  Physical Exam   VS:  BP 137/64 (BP Location: Left Arm)   Pulse 69   Temp 97.7 F (36.5 C) (Oral)   Resp 18   Ht 5\' 11"  (1.803 m)   Wt 65.1 kg   SpO2 97%   BMI 20.02 kg/m  , BMI Body mass index is 20.02 kg/m.  General appearance: alert and no distress Neck: no carotid bruit, no JVD and thyroid not enlarged, symmetric, no tenderness/mass/nodules Lungs: diminished breath sounds bilaterally Heart: irregularly irregular rhythm Abdomen: soft, non-tender; bowel sounds normal; no masses,  no organomegaly Extremities: extremities normal, atraumatic, no cyanosis or edema Pulses: 2+ and symmetric Skin: Skin color, texture, turgor normal. No rashes or lesions Neurologic: Mental status: Alert, oriented, thought content appropriate Psych: Pleasant  Labs    High Sensitivity Troponin:   Recent Labs  Lab 12/28/20 1720 12/31/20 0830  TROPONINIHS 14 9      Chemistry Recent Labs  Lab 12/31/20 0229 01/02/21 0215 01/04/21 0253 01/05/21 0246 01/06/21 0241  NA 137   < > 136 140 141  K 4.1   < > 4.2 3.2* 3.9  CL 107   < > 104 104 109  CO2 21*   < > 24  25 26  GLUCOSE 134*   < > 126* 111* 123*  BUN 19   < > 23 23 24*  CREATININE 0.84   < > 0.79 0.91 0.96  CALCIUM 7.7*   < > 7.8* 7.9* 8.1*  PROT 4.8*  --   --   --   --   ALBUMIN 1.7*  --   --   --   --   AST 53*  --   --   --   --   ALT 40  --   --   --   --   ALKPHOS 72  --   --   --   --   BILITOT 0.2*  --   --   --   --   GFRNONAA >60   < > >60 >60 >60  ANIONGAP 9   < > 8 11 6    < > = values in this interval not displayed.     Hematology Recent Labs  Lab 01/04/21 0253 01/05/21 0246 01/06/21 0241  WBC 9.5 9.0 12.7*  RBC 4.25 4.21* 4.09*  HGB 11.7* 11.6* 11.4*  HCT 36.9* 36.7* 36.4*  MCV 86.8 87.2 89.0  MCH 27.5 27.6 27.9  MCHC 31.7 31.6 31.3  RDW 14.2 14.7 14.9  PLT 367 374 364    BNPNo results for input(s): BNP, PROBNP in the last 168 hours.   DDimer No results for input(s): DDIMER in the  last 168 hours.   Radiology    CT CHEST WO CONTRAST  Result Date: 01/04/2021 CLINICAL DATA:  Right pleural effusion. EXAM: CT CHEST WITHOUT CONTRAST TECHNIQUE: Multidetector CT imaging of the chest was performed following the standard protocol without IV contrast. COMPARISON:  January 01, 2021. FINDINGS: Cardiovascular: 4.3 cm ascending thoracic aortic aneurysm is noted. Atherosclerosis of thoracic aorta is noted. Coronary artery calcifications are noted. Normal cardiac size. No pericardial effusion. Mediastinum/Nodes: No enlarged mediastinal or axillary lymph nodes. Thyroid gland, trachea, and esophagus demonstrate no significant findings. Lungs/Pleura: Stable position of right-sided pigtail drainage catheter seen in right hemithorax. Stable small right pleural effusion is noted. Significantly increased amount of air is noted in upper lung zone consistent with increased right hydropneumothorax. Stable moderate left pleural effusion is noted with associated atelectasis or infiltrate of the left lower lobe. Stable right middle lobe and lower lobe opacities are noted concerning for atelectasis or infiltrates. Upper Abdomen: No acute abnormality. Musculoskeletal: No significant osseous abnormality is noted. There is interval development of subcutaneous emphysema involving the right posterior chest wall. IMPRESSION: 1. Stable position of right-sided pigtail drainage catheter seen in right hemithorax. Significantly increased amount of air is noted in the upper lung zone consistent with increased right hydropneumothorax. Stable small right pleural effusion is noted. 2. Stable moderate left pleural effusion is noted with associated atelectasis or infiltrate of the left lower lobe. 3. Stable right middle lobe and lower lobe opacities are noted concerning for atelectasis or infiltrates. 4. Interval development of subcutaneous emphysema involving the right posterior chest wall. 5. Coronary artery calcifications are  noted. 6. 4.3 cm ascending thoracic aortic aneurysm is noted. 7. Aortic atherosclerosis. Aortic Atherosclerosis (ICD10-I70.0). Electronically Signed   By: Marijo Conception M.D.   On: 01/04/2021 14:45   DG Chest Port 1 View  Result Date: 01/06/2021 CLINICAL DATA:  Empyema. EXAM: PORTABLE CHEST 1 VIEW COMPARISON:  One-view chest x-ray 01/05/2021 FINDINGS: Heart is mildly enlarged. Atherosclerotic changes are noted at the arch. Left pleural effusion is present. A right hydropneumothorax remains  following removal of the right-sided chest tube. There is no significant interval change. Pneumothorax measures 10-15%. Airspace opacities at the right base have increased some. IMPRESSION: 1. Stable right hydropneumothorax following removal of the right-sided chest tube. 2. Increasing airspace disease at the right base. This may represent atelectasis or inflammation associated with the previous chest tube. 3. Stable left pleural effusion without pneumothorax. Electronically Signed   By: San Morelle M.D.   On: 01/06/2021 06:19   DG Chest Port 1 View  Result Date: 01/05/2021 CLINICAL DATA:  Follow-up empyema. EXAM: PORTABLE CHEST 1 VIEW COMPARISON:  Chest x-ray and chest CT 01/04/2021 FINDINGS: The upper right chest tube has been removed. The lower chest tube is still in place. There is a small persistent pneumothorax estimated at 5-10%. Stable small bilateral pleural effusions and bibasilar atelectasis. IMPRESSION: Persistent 5-10% right-sided pneumothorax (when compared to the prior chest CT). Electronically Signed   By: Marijo Sanes M.D.   On: 01/05/2021 08:19    Cardiac Studies   Echo 12/30/20: 1. Left ventricular ejection fraction, by estimation, is 40 to 45%.  Difficult to determine systolic function as poor image quality and patient  is in Afib with RVR, but appears left ventricle has mildly decreased  function. Would consider repeat limited  echo with contrast once heart rate better controlled.  Left ventricular  endocardial border not optimally defined to evaluate regional wall motion.  There is mild left ventricular hypertrophy. Left ventricular diastolic  parameters are indeterminate.  2. Right ventricular systolic function is mildly reduced. The right  ventricular size is mildly enlarged. There is normal pulmonary artery  systolic pressure. The estimated right ventricular systolic pressure is  78.2 mmHg.  3. Left atrial size was moderately dilated.  4. The mitral valve is degenerative. No evidence of mitral valve  regurgitation. Moderate mitral annular calcification.  5. The aortic valve was not well visualized. Aortic valve regurgitation  is trivial. No aortic stenosis is present.  6. Aortic dilatation noted. There is mild dilatation of the aortic root,  measuring 42 mm. There is mild dilatation of the ascending aorta,  measuring 41 mm.  7. The inferior vena cava is dilated in size with <50% respiratory  variability, suggesting right atrial pressure of 15 mmHg.   Patient Profile     87 y.o. male with  asymptomatic coronary calcification, OSA, longstanding persistent atrial fibrillation, prior GI bleed (secondary to GIST) prostate cancer, frequent falls, mild aortic aneurysm, and anemia admitted with pneumonia 2 weeks after being positive for COVID-19. Hospitalization has been complicated by atrial fibrillation with RVR and hypotension.    Assessment & Plan    # Acute systolic and diastolic heart failure:  # Mixed cardiogenic/septic shock: LVEF 40 to 45% this admission, down from 65 to 70% previously. Mixed cardiogenic and septic shock improving.  He is now off of phenylephrine. Blood pressure appears improved -will wean down midodrine to BID - if tolerated today, then d/c tomorrow am. Hopefully can introduce GDMT for CHF when BP improves.  # Persistent atrial fibrillation: Rates are better controlled. Amiodarone was discontinued given that he remains in atrial  fibrillation. Rates are consistently <100 bpm.  On digoxin and Eliquis. Continue.  # Recent COVID-19 infection:  # HAP, R-sided loculated pleural effusion:  Chest tube in place and he had an anterior tube placed 2/1.  One tube was remained 2/3.  He has intrapleural fibrinolytic therapy.  BP improving.  Per critical care. Will have longer-term antibiotic therapy.  For questions  or updates, please contact [place] Please consult [email or web address] for contact info under   Pixie Casino, MD, FACC, [place] Director of the [facility] Diplomate of the American Board of Clinical Lipidology Attending Cardiologist  Direct Dial: [phone]  Fax: [phone]  Website:  [email or web address]  Pixie Casino, MD  01/06/2021, 8:42 AM

## 2021-01-06 NOTE — Plan of Care (Signed)

## 2021-01-06 NOTE — Progress Notes (Signed)
PT Cancellation Note  Patient Details Name: Casey Gates. MRN: [id number] DOB: Sep 21, 1926   Cancelled Treatment:     PT deferred at request of RN this pm.  Pt sleeping soundly and allowed to rest.  Will follow.   Dnasia Gauna 01/06/2021, 4:26 PM

## 2021-01-06 NOTE — Progress Notes (Signed)
eLink Physician-Brief Progress Note Patient Name: Casey Gates. DOB: [birth year] MRN: [id number]   Date of Service  01/06/2021  HPI/Events of Note  Phosphorus level 1.8, patient is taking po.  eICU Interventions  Oral Neutraphos ordered.        Kerry Kass Kayzlee Wirtanen 01/06/2021, 6:53 AM

## 2021-01-07 DIAGNOSIS — G8929 Other chronic pain: Secondary | ICD-10-CM | POA: Diagnosis not present

## 2021-01-07 DIAGNOSIS — G9341 Metabolic encephalopathy: Secondary | ICD-10-CM | POA: Diagnosis not present

## 2021-01-07 DIAGNOSIS — I5043 Acute on chronic combined systolic (congestive) and diastolic (congestive) heart failure: Secondary | ICD-10-CM | POA: Diagnosis not present

## 2021-01-07 DIAGNOSIS — J869 Pyothorax without fistula: Secondary | ICD-10-CM | POA: Diagnosis not present

## 2021-01-07 DIAGNOSIS — J9601 Acute respiratory failure with hypoxia: Secondary | ICD-10-CM | POA: Diagnosis not present

## 2021-01-07 DIAGNOSIS — I4891 Unspecified atrial fibrillation: Secondary | ICD-10-CM | POA: Diagnosis not present

## 2021-01-07 LAB — GLUCOSE, CAPILLARY
Glucose-Capillary: 132 mg/dL — ABNORMAL HIGH (ref 70–99)
Glucose-Capillary: 64 mg/dL — ABNORMAL LOW (ref 70–99)
Glucose-Capillary: 66 mg/dL — ABNORMAL LOW (ref 70–99)
Glucose-Capillary: 130 mg/dL — ABNORMAL HIGH (ref 70–99)
Glucose-Capillary: 89 mg/dL (ref 70–99)
Glucose-Capillary: 86 mg/dL (ref 70–99)
Glucose-Capillary: 96 mg/dL (ref 70–99)

## 2021-01-07 LAB — RENAL FUNCTION PANEL
Albumin: 1.7 g/dL — ABNORMAL LOW (ref 3.5–5.0)
Anion gap: 8 (ref 5–15)
BUN: 21 mg/dL (ref 8–23)
CO2: 27 mmol/L (ref 22–32)
Calcium: 7.8 mg/dL — ABNORMAL LOW (ref 8.9–10.3)
Chloride: 107 mmol/L (ref 98–111)
Creatinine, Ser: 0.89 mg/dL (ref 0.61–1.24)
GFR, Estimated: >60 mL/min
Glucose, Bld: 76 mg/dL (ref 70–99)
Phosphorus: 3.6 mg/dL (ref 2.5–4.6)
Potassium: 4.9 mmol/L (ref 3.5–5.1)
Sodium: 142 mmol/L (ref 135–145)

## 2021-01-07 LAB — CBC WITH DIFFERENTIAL/PLATELET
Abs Immature Granulocytes: 0.11 10*3/uL — ABNORMAL HIGH (ref 0.00–0.07)
Basophils Absolute: 0 10*3/uL (ref 0.0–0.1)
Basophils Relative: 0 %
Eosinophils Absolute: 0.2 10*3/uL (ref 0.0–0.5)
Eosinophils Relative: 2 %
HCT: 40.8 % (ref 39.0–52.0)
Hemoglobin: 12.8 g/dL — ABNORMAL LOW (ref 13.0–17.0)
Immature Granulocytes: 1 %
Lymphocytes Relative: 11 %
Lymphs Abs: 1.4 10*3/uL (ref 0.7–4.0)
MCH: 27.9 pg (ref 26.0–34.0)
MCHC: 31.4 g/dL (ref 30.0–36.0)
MCV: 88.9 fL (ref 80.0–100.0)
Monocytes Absolute: 1 10*3/uL (ref 0.1–1.0)
Monocytes Relative: 8 %
Neutro Abs: 9.7 10*3/uL — ABNORMAL HIGH (ref 1.7–7.7)
Neutrophils Relative %: 78 %
Platelets: 297 10*3/uL (ref 150–400)
RBC: 4.59 MIL/uL (ref 4.22–5.81)
RDW: 15.5 % (ref 11.5–15.5)
WBC: 12.5 10*3/uL — ABNORMAL HIGH (ref 4.0–10.5)
nRBC: 0 % (ref 0.0–0.2)

## 2021-01-07 LAB — MAGNESIUM: Magnesium: 2 mg/dL (ref 1.7–2.4)

## 2021-01-07 MED ORDER — DEXTROSE 50 % IV SOLN
12.5000 g | INTRAVENOUS | Status: AC
  Administered 2021-01-07: 12.5 g via INTRAVENOUS

## 2021-01-07 MED ORDER — DEXTROSE 50 % IV SOLN
INTRAVENOUS | Status: AC
  Filled 2021-01-07: qty 50

## 2021-01-07 NOTE — Progress Notes (Signed)
PROGRESS NOTE    Casey Gates.  [id number] DOB: [birth year] DOA: 12/17/2020 PCP: Velna Hatchet, MD    Chief Complaint  Patient presents with  . Urinary Tract Infection    Brief Narrative:  Patient 87 year old gentleman initially admitted 12/17/2020 with pneumonia and associated right-sided loculated pleural effusion.  Patient completed full course of antibiotics however continued to have worsening loculated right pleural effusion with evolution of air/gas fluid level.  Right chest tube placed and patient completed intrapleural fibrinolytic therapy. Patient subsequently noted to go into septic shock with concerns for mild adrenal insufficiency and transferred to the ICU and subsequently placed on pressors as well as stress dose steroids.  Patient had a subsequent second chest tube placed by IR in the right lateral superior loculated pleural fluid.  Patient also on midodrine.  Patient also noted to go into A. fib with RVR cardiology consulted patient initially on amiodarone due to shock and subsequently transition to digoxin.  Cardiology following. Pressors weaned off.  Patient subsequently transferred to [facility].  PCCM following for chest tube management.   Assessment & Plan:   Principal Problem:   Acute respiratory failure with hypoxia (HCC) Active Problems:   Multifocal pneumonia   Chronic pain   Urinary retention   Septic shock (HCC)   Prostate cancer (HCC)   Atrial fibrillation with RVR (HCC)   Acute metabolic encephalopathy   Hypernatremia   Empyema (HCC)   DNR (do not resuscitate)   Goals of care, counseling/discussion   Shock circulatory ([place])   Large pleural effusion   Acute on chronic combined systolic and diastolic CHF (congestive heart failure) ([place])   1 acute hypoxemic respiratory failure secondary to pneumonia with empyema/OSA/right-sided pneumothorax Patient with clinical improvement.  Currently on room air.  Status post chest tube placement x2.   Apical chest tube discontinued 01/04/2021.  Right chest tube discontinued 01/05/2021.  CT chest done concerning for increased right hydropneumothorax, stable moderate left pleural effusion, stable right middle lobe and lower lobe opacities, interval development of subcutaneous emphysema involving right posterior chest wall.  Chest x-ray from today with persistent 5 to 10% right-sided pneumothorax which is stable.  Patient denies any significant shortness of breath.  Continue pulmonary hygiene, mobilization.  Continue Pulmicort.  IV antibiotics have been transitioned to oral antibiotics of Augmentin.  PT/OT following.  Per PCCM.  2.  Right empyema secondary to PNA Concern that patient's right empyema may be in the setting of aspiration. Status post chest tube placement per IR 12/26/2020 with completion of fibrinolytics.  Patient with apical chest tube placed to 12/21/2020 for residual loculated empyema.  Apical chest tube discontinued 01/04/2021.  Right chest tube discontinued 01/05/2021 per PCCM.  CT chest (01/04/2021) with stable position right-sided pigtail drainage catheter seen in right hemithorax, significantly increased amount of air noted in the upper lung zone consistent with increased right hydropneumothorax, stable right small pleural effusion.  Stable moderate left pleural effusion noted with associated atelectasis or infiltrate in the left lower lobe, stable right middle lobe and lower lobe opacities noted concerning for atelectasis or infiltrates, interval development of subcutaneous emphysema involving right posterior chest wall.  Repeat chest x-ray with persistent 5 to 10% right-sided pneumothorax which has been stable. IV antibiotics have been transitioned to oral Augmentin with PCCM recommending 14-day course of antibiotics, through 01/16/2021.  PCCM recommending repeat CT chest on 01/16/2021 and if still residual disease/persistent signs of infection, if unresolved infiltrates will need an extra week of  Augmentin.  3.  Septic shock/adrenal insufficiency Patient was transferred to the ICU 12/28/2018.  Has noted to be in septic shock requiring pressors.  Patient also placed on stress dose steroids.  Cortisol level noted at 16.1.  Patient currently on midodrine and IV Solu-Cortef.  Systolic blood pressure this morning in the 120s.  Discontinue IV Solu-Cortef.  Midodrine has been discontinued per cardiology.  Currently on oral antibiotics.  Follow.  4.  Persistent atrial fibrillation Patient initially was on amiodarone for management of A. fib as patient also noted to be in septic shock.  Amiodarone discontinued and patient placed on digoxin for better rate control.  Heart rate improved.  Blood pressure improved on midodrine and IV Solu-Cortef. Diltiazem currently on hold.  Discontinue IV Solu-Cortef.  Midodrine has been discontinued per cardiology and if blood pressure remains stable in 24 hours may consider addition of metoprolol.  Was on IV heparin and transition to Eliquis as of 01/04/2021.  Keep potassium approximately at 4.  Keep magnesium >2.  Per cardiology.    5.  Acute systolic and diastolic heart failure/mixed cardiogenic shock 2D echo done with EF of 40 to 45% down from 65 to 70%.  Patient required pressors early on during the hospitalization which have subsequently been weaned off.  Urine output of 1.3 L over the past 24 hours.  Blood pressure stable with systolics in the [id number] on midodrine.  Midodrine discontinued per cardiology.  Will DC IV stress dose steroids.  Continue digoxin.  Per cardiology if blood pressure remains stable may consider addition of beta-blocker tomorrow.  Per cardiology.  6.  Acute metabolic encephalopathy Secondary to sepsis.  Resolved.  Follow.  7.  Hypophosphatemia/hypokalemia Phosphorus at 3.6 this morning.  Potassium of 4.9.  Follow.   8.  Chronic pain Stable.  Patient noted to be on Opana and Neurontin as needed prior to admission.  Continue oxycodone 5 mg p.o.  every 6 hours as needed pain.  9.  Moderate protein calorie malnutrition Continue current dysphagia 3 diet.  Follow.  10.  History of prostate cancer/urinary retention Foley catheter replaced on 01/03/2021.  Urine output of 1.3 L recorded over the past 24 hours.  Patient with some slight blood noted in tubing.  Voiding trial today.  PVR.  If patient fails voiding trial will need to place Foley catheter back in with outpatient follow-up with urology.   11.  Anemia Patient with no overt bleeding.  Hemoglobin stable at 12.8.  Transfusion threshold hemoglobin < 7.    DVT prophylaxis: Heparin>>>> Eliquis Code Status: DNR Family Communication: Updated patient and son at bedside Disposition:   Status is: Inpatient    Dispo: The patient is from: ALF              Anticipated d/c is to: SNF              Anticipated d/c date is: 01/09/2021-01/10/2021              Patient currently being weaned off midodrine and IV stress dose steroids.  Not stable for discharge.    Difficult to place patient undetermined       Consultants:   PCCM: Dr. Chase Caller 12/25/2020  Cardiology: Dr. Johney Frame 12/19/2020  Significant Hospital Events:  1/16 Admit, started on broad spectrum abx 1/17 Thoracentesis with exudative pleural effusion, 222ml hazy amber fluid removed 1/18 [facility], Cardiology consulted 1/19 Completed azithromycin  1/23 Completed ceftriaxone  1/24 PCCM consulted  1/26 R chest tube with intraplural tPA / dornase instilled  1/28  Completed 3rd dose intrapleural fibrinolytic therapy  1/29 [place] O2, afebrile with normal mental status. Labile rhythm / BP on low dose neosynephrine   1/30  O2, cortisol 16.1, on stress dose steroids on neo. On dig/amio for AF.  2/01 Anterior chest tube placed per IR into R lateral superior loculated pleural fluid  2/03 Apical pocket of fluid cleared on CXR, apical chest tube discontinued 2/04 persistent 5- 10% right-sided pneumothorax 2/4/right chest tube  removed     Procedures:   CT Chest 1/24 >> moderate to large loculated right pleural effusion is increased in volume when compared to 1/16.  New focus of gas with air-fluid level in the posterior compartment of the effusion.  Progressive atelectasis of the entire right lower lobe with partial atelectasis of the right middle lobe and right upper lobe.  Progressive consolidation involving the posterior medial left lower lobe containing air bronchograms. CT Chest w/o 2/3 >> 1. Stable position of right-sided pigtail drainage catheter seen in right hemithorax. Significantly increased amount of air is noted in the upper lung zone consistent with increased right hydropneumothorax. Stable small right pleural effusion is noted. 2. Stable moderate left pleural effusion is noted with associated atelectasis or infiltrate of the left lower lobe. 3. Stable right middle lobe and lower lobe opacities are noted concerning for atelectasis or infiltrates. 4. Interval development of subcutaneous emphysema involving the right posterior chest wall. 5. Coronary artery calcifications are noted. 6. 4.3 cm ascending thoracic aortic aneurysm is noted.  7. Aortic atherosclerosis.  R lateral chest tube 1/25 per IR>>>  Right anterior chest tube 2/1 >>>    Antimicrobials:  Azithro 1/16 >> 1/20  Ceftriaxone 1/16 >> 1/23  Cefepime 1/24 >> 1/27  Vanco 1/24 >> 1/31 Meropenem 1/29 >>  2/4 Zyvox 1/31>> 2/4 Augmentin 01/05/2021>>> 01/16/2021  Micro Data:  COVID 1/16 >> negative  BCx2 1/16 >> negative  Pleural fluid 1/17 >> negative  MRSA PCR 1/18 >> positive  Chest tube aspirate 1/25 >> negative  BCx2 1/27 >> negative  UC 1/27 >> negative     Subjective: Patient sitting up in bed.  Denies any chest pain.  No shortness of breath.  No nausea or emesis.  No abdominal pain.  Systolic blood pressures in the 120s.  Patient noted to have slight blood noted in Foley catheter tubing likely secondary to trauma.  Son  at bedside.  Tolerating oral intake.    Objective: Vitals:   01/06/21 2140 01/06/21 2151 01/07/21 0133 01/07/21 0520  BP:  117/72 127/82 129/74  Pulse:  78 83 89  Resp:  16 20 16   Temp:  (!) 97.5 F (36.4 C) 97.6 F (36.4 C) (!) 97.4 F (36.3 C)  TempSrc:  Oral Oral Oral  SpO2:  97% 96% 97%  Weight: 68.7 kg     Height:        Intake/Output Summary (Last 24 hours) at 01/07/2021 1133 Last data filed at 01/07/2021 1100 Gross per 24 hour  Intake 751.34 ml  Output 1301 ml  Net -549.66 ml   Filed Weights   12/29/20 0615 12/31/20 0630 01/06/21 2140  Weight: 65.4 kg 65.1 kg 68.7 kg    Examination:  General exam: NAD Respiratory system: Coarse BS right base.  No wheezing, no crackles.  Fair air movement. Cardiovascular system: Irregularly irregular.  No JVD.  No lower extremity edema.   Gastrointestinal system: Abdomen is soft, nontender, nondistended, positive bowel sounds.  No rebound.  No guarding.  Central nervous system: Alert and  oriented. No focal neurological deficits. Extremities: Symmetric 5 x 5 power. Skin: No rashes, lesions or ulcers Psychiatry: Judgement and insight appear normal. Mood & affect appropriate.     Data Reviewed: I have personally reviewed following labs and imaging studies  CBC: Recent Labs  Lab 01/03/21 0252 01/04/21 0253 01/05/21 0246 01/06/21 0241 01/07/21 0604  WBC 10.5 9.5 9.0 12.7* 12.5*  NEUTROABS  --   --   --  11.0* 9.7*  HGB 13.0 11.7* 11.6* 11.4* 12.8*  HCT 41.4 36.9* 36.7* 36.4* 40.8  MCV 88.7 86.8 87.2 89.0 88.9  PLT 390 367 374 364 277    Basic Metabolic Panel: Recent Labs  Lab 01/03/21 0252 01/04/21 0253 01/05/21 0246 01/06/21 0241 01/07/21 0604  NA 137 136 140 141 142  K 4.6 4.2 3.2* 3.9 4.9  CL 105 104 104 109 107  CO2 25 24 25 26 27   GLUCOSE 109* 126* 111* 123* 76  BUN 22 23 23  24* 21  CREATININE 0.82 0.79 0.91 0.96 0.89  CALCIUM 8.0* 7.8* 7.9* 8.1* 7.8*  MG 2.1  --  2.1 2.2 2.0  PHOS 2.9 2.6 2.6 1.8* 3.6     GFR: Estimated Creatinine Clearance: 51.5 mL/min (by C-G formula based on SCr of 0.89 mg/dL).  Liver Function Tests: Recent Labs  Lab 01/07/21 0604  ALBUMIN 1.7*    CBG: Recent Labs  Lab 01/06/21 2143 01/07/21 0741 01/07/21 0817 01/07/21 0840 01/07/21 1130  GLUCAP 110* 64* 66* 130* 89     Recent Results (from the past 240 hour(s))  Culture, blood (Routine X 2) w Reflex to ID Panel     Status: None   Collection Time: 12/28/20  5:20 PM   Specimen: BLOOD LEFT HAND  Result Value Ref Range Status   Specimen Description   Final    BLOOD LEFT HAND Performed at [facility] [address], [place], [place] [address]    Special Requests   Final    BOTTLES DRAWN AEROBIC ONLY Blood Culture adequate volume Performed at [facility] [address], [place], [place] [address]    Culture   Final    NO GROWTH 5 DAYS Performed at [facility], [place] [address] [place], [place] [address]    Report Status 01/03/2021 FINAL  Final  Culture, blood (Routine X 2) w Reflex to ID Panel     Status: None   Collection Time: 12/28/20  5:20 PM   Specimen: BLOOD  Result Value Ref Range Status   Specimen Description   Final    BLOOD RIGHT ANTECUBITAL Performed at [facility] [address] [place], [place] [address]    Special Requests   Final    BOTTLES DRAWN AEROBIC ONLY Blood Culture adequate volume Performed at [facility] [address] [place], [place] [address]    Culture   Final    NO GROWTH 5 DAYS Performed at [facility], [place] [address] [place], [place] [address]    Report Status 01/03/2021 FINAL  Final         Radiology Studies: DG Chest Port 1 View  Result Date: 01/06/2021 CLINICAL DATA:  Empyema. EXAM: PORTABLE CHEST 1 VIEW COMPARISON:  One-view chest x-ray 01/05/2021 FINDINGS: Heart is mildly enlarged. Atherosclerotic changes are noted at the arch. Left pleural effusion  is present. A right hydropneumothorax remains following removal of the right-sided chest tube. There is no significant interval change. Pneumothorax measures 10-15%. Airspace opacities  at the right base have increased some. IMPRESSION: 1. Stable right hydropneumothorax following removal of the right-sided chest tube. 2. Increasing airspace disease at the right base. This may represent atelectasis or inflammation associated with the previous chest tube. 3. Stable left pleural effusion without pneumothorax. Electronically Signed   By: San Morelle M.D.   On: 01/06/2021 06:19        Scheduled Meds: . amoxicillin-clavulanate  1 tablet Oral Q12H  . apixaban  2.5 mg Oral BID  . artificial tears  1 application Left Eye TID  . budesonide (PULMICORT) nebulizer solution  0.5 mg Nebulization BID  . chlorhexidine  15 mL Mouth Rinse BID  . Chlorhexidine Gluconate Cloth  6 each Topical Daily  . dextrose      . digoxin  0.0625 mg Oral Daily  . feeding supplement  237 mL Oral BID BM  . hydrocortisone sod succinate (SOLU-CORTEF) inj  50 mg Intravenous Daily  . mouth rinse  15 mL Mouth Rinse q12n4p  . phosphorus  250 mg Oral TID   Continuous Infusions: . sodium chloride Stopped (01/02/21 1136)     LOS: 21 days    Time spent: 40 minutes    Irine Seal, MD [facility]   To contact the attending provider between 7A-7P or the covering provider during after hours 7P-7A, please log into the web site [email or web address] and access using universal [place] password for that web site. If you do not have the password, please call the hospital operator.  01/07/2021, 11:33 AM

## 2021-01-07 NOTE — Plan of Care (Signed)

## 2021-01-07 NOTE — Procedures (Signed)
Foley Catheter Placement Note  Indications: 87 y.o. male with history of prostate cancer status post radical prostatectomy many years ago, currently admitted with urinary retention. Urology consulted for assistance with foley catheter after removal earlier today. Most recent bladder scan 1 hour ago for 306 ml. Two attempts with 18Fr coude catheter made by RN with curling of the catheter at the level of the meatus under the foreskin, which is noted to be quite edematous.  Pre-operative Diagnosis: Urinary retention  Post-operative Diagnosis: Same  Surgeon: Carmie Kanner, MD  Assistants: None  Procedure Details  Patient was placed in the supine position, prepped with Betadine and draped in the usual sterile fashion. Manual pressure was applied to reduce foreskin edema. Once reduced, able to retract foreskin enough to visualize glans. I injected lidocaine jelly per urethra prior to the procedure.  Ithen inserted a 14 [place] coude catheter per urethra which easily passed into the bladder without any resistance at the prostatic urethra.  We achieved return of clear yellow urine and then proceeded to insert 10 mL of sterile water into the Foley balloon.  The catheter was attached to a drainage bag and secured with a StatLock.  Placement of the catheter had return of greater than 300 mL of clear yellow urine.               Complications: None; patient tolerated the procedure well.  Plan:   1.  Patient tentatively planning for discharge to SNF tomorrow, will schedule patient for outpatient follow up with urology        Attending Attestation: Dr. Diona Fanti was available.

## 2021-01-07 NOTE — Progress Notes (Signed)
Two urology RNs attempted to place coude catheter but were unsuccessful. MD made aware. No new orders at this time. Will continue to monitor.

## 2021-01-07 NOTE — Progress Notes (Signed)
Hypoglycemic Event  CBG: 64  Treatment: Pt was only willing to dink a small amount of juice, only brought sugar to 66. 12.5g of IV dextrose given   Symptoms: not symptomatic   Follow-up CBG: Time:after juice 66 @ 817>> after IV dextrose 130 @ 0840    Possible Reasons for Event: Had not eaten breakfast yet   Comments/MD notified:MD aware, see new orders.     Con Memos

## 2021-01-07 NOTE — Progress Notes (Signed)
Progress Note  Patient Name: Casey Gates. Date of Encounter: 01/07/2021  [facility] Cardiologist: Larae Grooms, MD   Subjective   BP better overnight. Afib rates still reasonably controlled. If bp tolerates, introduce low dose BB for rate-control.  Inpatient Medications    Scheduled Meds: . amoxicillin-clavulanate  1 tablet Oral Q12H  . apixaban  2.5 mg Oral BID  . artificial tears  1 application Left Eye TID  . budesonide (PULMICORT) nebulizer solution  0.5 mg Nebulization BID  . chlorhexidine  15 mL Mouth Rinse BID  . Chlorhexidine Gluconate Cloth  6 each Topical Daily  . dextrose      . digoxin  0.0625 mg Oral Daily  . feeding supplement  237 mL Oral BID BM  . hydrocortisone sod succinate (SOLU-CORTEF) inj  50 mg Intravenous Daily  . mouth rinse  15 mL Mouth Rinse q12n4p  . midodrine  5 mg Oral BID WC  . phosphorus  250 mg Oral TID   Continuous Infusions: . sodium chloride Stopped (01/02/21 1136)   PRN Meds: fentaNYL (SUBLIMAZE) injection, ipratropium-albuterol, lidocaine (PF), lip balm, loperamide, metoprolol tartrate, ondansetron **OR** ondansetron (ZOFRAN) IV, oxyCODONE, Resource ThickenUp Clear   Vital Signs    Vitals:   01/06/21 2140 01/06/21 2151 01/07/21 0133 01/07/21 0520  BP:  117/72 127/82 129/74  Pulse:  78 83 89  Resp:  16 20 16   Temp:  (!) 97.5 F (36.4 C) 97.6 F (36.4 C) (!) 97.4 F (36.3 C)  TempSrc:  Oral Oral Oral  SpO2:  97% 96% 97%  Weight: 68.7 kg     Height:        Intake/Output Summary (Last 24 hours) at 01/07/2021 0954 Last data filed at 01/07/2021 0532 Gross per 24 hour  Intake 511.34 ml  Output 1301 ml  Net -789.66 ml   Last 3 Weights 01/06/2021 12/31/2020 12/29/2020  Weight (lbs) 151 lb 7.3 oz 143 lb 8.3 oz 144 lb 2.9 oz  Weight (kg) 68.7 kg 65.1 kg 65.4 kg      Telemetry    Atrial fibrillation rates in the 100's- Personally Reviewed  ECG    n/a - Personally Reviewed  Physical Exam   VS:  BP 129/74 (BP  Location: Right Arm)   Pulse 89   Temp (!) 97.4 F (36.3 C) (Oral)   Resp 16   Ht 5\' 11"  (1.803 m)   Wt 68.7 kg   SpO2 97%   BMI 21.12 kg/m  , BMI Body mass index is 21.12 kg/m.  General appearance: alert and no distress Neck: no carotid bruit, no JVD and thyroid not enlarged, symmetric, no tenderness/mass/nodules Lungs: diminished breath sounds bilaterally Heart: irregularly irregular rhythm Abdomen: soft, non-tender; bowel sounds normal; no masses,  no organomegaly Extremities: extremities normal, atraumatic, no cyanosis or edema Pulses: 2+ and symmetric Skin: Skin color, texture, turgor normal. No rashes or lesions Neurologic: Mental status: Alert, oriented, thought content appropriate Psych: Pleasant  Labs    High Sensitivity Troponin:   Recent Labs  Lab 12/28/20 1720 12/31/20 0830  TROPONINIHS 14 9      Chemistry Recent Labs  Lab 01/05/21 0246 01/06/21 0241 01/07/21 0604  NA 140 141 142  K 3.2* 3.9 4.9  CL 104 109 107  CO2 25 26 27   GLUCOSE 111* 123* 76  BUN 23 24* 21  CREATININE 0.91 0.96 0.89  CALCIUM 7.9* 8.1* 7.8*  ALBUMIN  --   --  1.7*  GFRNONAA >60 >60 >60  ANIONGAP  11 6 8      Hematology Recent Labs  Lab 01/05/21 0246 01/06/21 0241 01/07/21 0604  WBC 9.0 12.7* 12.5*  RBC 4.21* 4.09* 4.59  HGB 11.6* 11.4* 12.8*  HCT 36.7* 36.4* 40.8  MCV 87.2 89.0 88.9  MCH 27.6 27.9 27.9  MCHC 31.6 31.3 31.4  RDW 14.7 14.9 15.5  PLT 374 364 297    BNPNo results for input(s): BNP, PROBNP in the last 168 hours.   DDimer No results for input(s): DDIMER in the last 168 hours.   Radiology    DG Chest Port 1 View  Result Date: 01/06/2021 CLINICAL DATA:  [place]. EXAM: PORTABLE CHEST 1 VIEW COMPARISON:  One-view chest x-ray 01/05/2021 FINDINGS: Heart is mildly enlarged. Atherosclerotic changes are noted at the arch. Left pleural effusion is present. A right hydropneumothorax remains following removal of the right-sided chest tube. There is no  significant interval change. Pneumothorax measures 10-15%. Airspace opacities at the right base have increased some. IMPRESSION: 1. Stable right hydropneumothorax following removal of the right-sided chest tube. 2. Increasing airspace disease at the right base. This may represent atelectasis or inflammation associated with the previous chest tube. 3. Stable left pleural effusion without pneumothorax. Electronically Signed   By: San Morelle M.D.   On: 01/06/2021 06:19    Cardiac Studies   Echo 12/30/20: 1. Left ventricular ejection fraction, by estimation, is 40 to 45%.  Difficult to determine systolic function as poor image quality and patient  is in Afib with RVR, but appears left ventricle has mildly decreased  function. Would consider repeat limited  echo with contrast once heart rate better controlled. Left ventricular  endocardial border not optimally defined to evaluate regional wall motion.  There is mild left ventricular hypertrophy. Left ventricular diastolic  parameters are indeterminate.  2. Right ventricular systolic function is mildly reduced. The right  ventricular size is mildly enlarged. There is normal pulmonary artery  systolic pressure. The estimated right ventricular systolic pressure is  [id number] mmHg.  3. Left atrial size was moderately dilated.  4. The mitral valve is degenerative. No evidence of mitral valve  regurgitation. Moderate mitral annular calcification.  5. The aortic valve was not well visualized. Aortic valve regurgitation  is trivial. No aortic stenosis is present.  6. Aortic dilatation noted. There is mild dilatation of the aortic root,  measuring 42 mm. There is mild dilatation of the ascending aorta,  measuring 41 mm.  7. The inferior vena cava is dilated in size with <50% respiratory  variability, suggesting right atrial pressure of 15 mmHg.   Patient Profile     87 y.o. male with  asymptomatic coronary calcification, OSA, longstanding  persistent atrial fibrillation, prior GI bleed (secondary to GIST) prostate cancer, frequent falls, mild aortic aneurysm, and anemia admitted with pneumonia 2 weeks after being positive for COVID-19. Hospitalization has been complicated by atrial fibrillation with RVR and hypotension.    Assessment & Plan    # Acute systolic and diastolic heart failure:  # Mixed cardiogenic/septic shock: LVEF 40 to 45% this admission, down from 65 to 70% previously. Mixed cardiogenic and septic shock improving.  He is now off of phenylephrine. Blood pressure appears improved - d/c midodrine today. If bp tolerates, consider low dose BB tomorrow.  # Persistent atrial fibrillation: Rates are better controlled. Amiodarone was discontinued given that he remains in atrial fibrillation. Rates are around 100 bpm.  On digoxin and Eliquis. Continue. Add low dose BB if bp acceptable after stopping midodrine.  #  Recent COVID-19 infection:  # HAP, R-sided loculated pleural effusion:  Chest tube in place and he had an anterior tube placed 2/1.  One tube was remained 2/3.  He has intrapleural fibrinolytic therapy.  BP improving.  Per critical care. Will have longer-term antibiotic therapy.  For questions or updates, please contact [place] Please consult [email or web address] for contact info under   Pixie Casino, MD, FACC, [place] Director of the [facility] Diplomate of the American Board of Clinical Lipidology Attending Cardiologist  Direct Dial: [phone]  Fax: [phone]  Website:  [email or web address]  Pixie Casino, MD  01/07/2021, 9:54 AM

## 2021-01-07 NOTE — Progress Notes (Signed)
Foley removed at 1200 for voiding trial. Pt unable to urinate on his own. Bladder scan reads 339ml at this time. MD made aware. Verbal orders given to place coude catheter.

## 2021-01-08 DIAGNOSIS — I5043 Acute on chronic combined systolic (congestive) and diastolic (congestive) heart failure: Secondary | ICD-10-CM | POA: Diagnosis not present

## 2021-01-08 DIAGNOSIS — I4891 Unspecified atrial fibrillation: Secondary | ICD-10-CM | POA: Diagnosis not present

## 2021-01-08 LAB — GLUCOSE, CAPILLARY
Glucose-Capillary: 110 mg/dL — ABNORMAL HIGH (ref 70–99)
Glucose-Capillary: 116 mg/dL — ABNORMAL HIGH (ref 70–99)
Glucose-Capillary: 126 mg/dL — ABNORMAL HIGH (ref 70–99)
Glucose-Capillary: 67 mg/dL — ABNORMAL LOW (ref 70–99)
Glucose-Capillary: 87 mg/dL (ref 70–99)
Glucose-Capillary: 88 mg/dL (ref 70–99)
Glucose-Capillary: 98 mg/dL (ref 70–99)

## 2021-01-08 LAB — RENAL FUNCTION PANEL
Albumin: 1.8 g/dL — ABNORMAL LOW (ref 3.5–5.0)
Anion gap: 10 (ref 5–15)
BUN: 19 mg/dL (ref 8–23)
CO2: 28 mmol/L (ref 22–32)
Calcium: 7.7 mg/dL — ABNORMAL LOW (ref 8.9–10.3)
Chloride: 101 mmol/L (ref 98–111)
Creatinine, Ser: 0.74 mg/dL (ref 0.61–1.24)
GFR, Estimated: >60 mL/min (ref >60)
Glucose, Bld: 79 mg/dL (ref 70–99)
Phosphorus: 3.1 mg/dL (ref 2.5–4.6)
Potassium: 4.3 mmol/L (ref 3.5–5.1)
Sodium: 139 mmol/L (ref 135–145)

## 2021-01-08 LAB — MAGNESIUM: Magnesium: 2.2 mg/dL (ref 1.7–2.4)

## 2021-01-08 LAB — HEMOGLOBIN AND HEMATOCRIT, BLOOD
HCT: 39 % (ref 39.0–52.0)
Hemoglobin: 12 g/dL — ABNORMAL LOW (ref 13.0–17.0)

## 2021-01-08 MED ORDER — METOPROLOL TARTRATE 25 MG PO TABS
12.5000 mg | ORAL_TABLET | Freq: Two times a day (BID) | ORAL | Status: DC
  Administered 2021-01-08 – 2021-01-09 (×3): 12.5 mg via ORAL
  Filled 2021-01-08 (×3): qty 1

## 2021-01-08 NOTE — Progress Notes (Signed)
Occupational Therapy Treatment Patient Details Name: Casey Gates. MRN: [id number] DOB: [birth year] Today's Date: 01/08/2021    History of present illness 87 year old gentleman admitted 12/17/2020 with PNA and R pleural effusion. Patient completed antibiotics however continued to have worsening loculated R pleural effusion with evolution of air/gas fluid level. R chest tube placed and patient completed intrapleural fibrinolytic therapy. Patient subsequently noted to go into septic shock with concerns for mild adrenal insufficiency and transferred to the ICU 1/27 and placed on pressors, stress dose steroids. Patient had second chest tube placed by IR in the R lateral superior loculated pleural fluid. Patient noted to go into A. fib with RVR cardiology consulted patient initially on amiodarone due to shock then transitioned to digoxin. Pressors weaned off and transferred out of ICU with chest tubes D/C'd on 2/3-2/4.   OT comments  Occupational Therapy re-ordered after transfer from ICU with patient and family's goal to go to rehab in order to get stronger and maximize patient independence with self care. Patient does appear to have cleared more mentally able to follow 1 step directions more consistently, however does still present with some STM deficits. Patient also has increased anxiety/fear of falling with mobility and attempt to stand at EOB. Patient required max A to transition supine to sit with cues to utilize bed rails. Pt tolerate sitting up EOB ~12 mins and attempted sit to stand twice with bed height elevated however patient provides ~25% effort and is unable to lift buttock from EOB "oh I can't." Pt denied dizziness/SOB throughout session. Recommend continued acute OT services to maximize patient safety and independence with self care in order to facilitate D/C to venue listed below.   Follow Up Recommendations  SNF    Equipment Recommendations  None recommended by OT       Precautions  / Restrictions Precautions Precautions: Fall Restrictions Weight Bearing Restrictions: No       Mobility Bed Mobility Overal bed mobility: Needs Assistance Bed Mobility: Supine to Sit;Sit to Supine     Supine to sit: Max assist;HOB elevated Sit to supine: Mod assist   General bed mobility comments: verbal cues for use of bed rails, pt requiring assist to bring LEs to EOB and max A with use of pad to sit upright with initial posterior lean. pt initiates laying himself back onto EOB, mod A to bring LEs onto bed  Transfers                 General transfer comment: attempted sit to stand from EOB twice with bed height elevated however patient is fearful of falling provides ~25% effort to raise buttock from EOB.    Balance Overall balance assessment: Needs assistance Sitting-balance support: Feet supported Sitting balance-Leahy Scale: Fair Sitting balance - Comments: pt sat EOB ~12 mins with cues hold self up with trunk and bring UEs to lap       Standing balance comment: unable                                            Cognition Arousal/Alertness: Awake/alert Behavior During Therapy: Anxious Overall Cognitive Status: No family/caregiver present to determine baseline cognitive functioning Area of Impairment: Memory                     Memory: Decreased short-term memory  General Comments: patient repeatedly stating he is afraid of falling and doesn't feel he can stand up, provided reassurance/encouragement              General Comments pt denies dizziness or SOB throughout    Pertinent Vitals/ Pain       Pain Assessment: Faces Faces Pain Scale: Hurts a little bit Pain Location: back Pain Descriptors / Indicators: Sore Pain Intervention(s): Monitored during session         Frequency  Min 2X/week        Progress Toward Goals  OT Goals(current goals can now be found in the care plan section)  Progress towards  OT goals: Progressing toward goals  Acute Rehab OT Goals Patient Stated Goal: to stand OT Goal Formulation: With patient Time For Goal Achievement: 01/22/21 Potential to Achieve Goals: Fair ADL Goals Pt Will Perform Grooming: with min assist;sitting (EOB) Pt Will Perform Upper Body Bathing: with min assist;sitting (EOB) Additional ADL Goal #1: Patient will sequence 2 step task with less than 25% multimodal cues in order to safely participate in self care tasks.  Plan Discharge plan remains appropriate       AM-PAC OT "6 Clicks" Daily Activity     Outcome Measure   Help from another person eating meals?: A Little Help from another person taking care of personal grooming?: A Little Help from another person toileting, which includes using toliet, bedpan, or urinal?: Total Help from another person bathing (including washing, rinsing, drying)?: Total Help from another person to put on and taking off regular upper body clothing?: A Lot Help from another person to put on and taking off regular lower body clothing?: Total 6 Click Score: 11    End of Session Equipment Utilized During Treatment: Gait belt;Rolling walker  OT Visit Diagnosis: Other abnormalities of gait and mobility (R26.89);Muscle weakness (generalized) (M62.81)   Activity Tolerance Patient limited by fatigue (Patient limited by anxiety)   Patient Left in bed;with call bell/phone within reach;with bed alarm set   Nurse Communication Mobility status        Time: 1326-1405 OT Time Calculation (min): 39 min  Charges: OT General Charges $OT Visit: 1 Visit OT Treatments $Self Care/Home Management : 38-52 mins  Delbert Phenix OT OT pager: [phone] 01/08/2021, 2:35 PM

## 2021-01-08 NOTE — Progress Notes (Signed)
Physical Therapy Treatment Patient Details Name: Casey Gates. MRN: [id number] DOB: 1926-09-19 Today's Date: 01/08/2021    History of Present Illness 87 year old gentleman admitted 12/17/2020 with PNA and R pleural effusion. Patient completed antibiotics however continued to have worsening loculated R pleural effusion with evolution of air/gas fluid level. R chest tube placed and patient completed intrapleural fibrinolytic therapy. Patient subsequently noted to go into septic shock with concerns for mild adrenal insufficiency and transferred to the ICU 1/27 and placed on pressors, stress dose steroids. Patient had second chest tube placed by IR in the R lateral superior loculated pleural fluid. Patient noted to go into A. fib with RVR cardiology consulted patient initially on amiodarone due to shock then transitioned to digoxin. Pressors weaned off and transferred out of ICU with chest tubes D/C'd on 2/3-2/4.    PT Comments    Pt declined OOB 2* wanting to rest and fatigued after OT session earlier today. He did agree to bed exercises. Explained to pt the importance of working with therapies and made him aware that we will continue to work with him and progress activity as able-pt agreeable.    Follow Up Recommendations  SNF     Equipment Recommendations  Wheelchair cushion (measurements PT);Wheelchair (measurements PT)    Recommendations for Other Services       Precautions / Restrictions Precautions Precautions: Fall Restrictions Weight Bearing Restrictions: No    Mobility  Bed Mobility Overal bed mobility: Needs Assistance Bed Mobility: Supine to Sit;Sit to Supine         General bed mobility comments: NT-pt declined OOB 2* fatigue (worked with OT already and wanting to rest)  Transfers                 General transfer comment: attempted sit to stand from EOB twice with bed height elevated however patient is fearful of falling provides ~25% effort to raise  buttock from EOB.  Ambulation/Gait                 Stairs             Wheelchair Mobility    Modified Rankin (Stroke Patients Only)       Balance Overall balance assessment: Needs assistance Sitting-balance support: Feet supported Sitting balance-Leahy Scale: Fair Sitting balance - Comments: pt sat EOB ~12 mins with cues hold self up with trunk and bring UEs to lap       Standing balance comment: unable                            Cognition Arousal/Alertness: Awake/alert Behavior During Therapy: WFL for tasks assessed/performed Overall Cognitive Status: No family/caregiver present to determine baseline cognitive functioning Area of Impairment: Memory                     Memory: Decreased short-term memory         General Comments: patient repeatedly stating he is afraid of falling and doesn't feel he can stand up, provided reassurance/encouragement      Exercises General Exercises - Lower Extremity Ankle Circles/Pumps: AROM;Both;10 reps Quad Sets: AROM;Both;10 reps Heel Slides: AROM;AAROM;Both;10 reps Hip ABduction/ADduction: AROM;AAROM;Both;10 reps    General Comments General comments (skin integrity, edema, etc.): pt denies dizziness or SOB throughout      Pertinent Vitals/Pain Pain Assessment: Faces Faces Pain Scale: Hurts a little bit Pain Location: back Pain Descriptors / Indicators: Sore Pain Intervention(s): Limited activity  within patient's tolerance;Monitored during session    Home Living                      Prior Function            PT Goals (current goals can now be found in the care plan section) Acute Rehab PT Goals Patient Stated Goal: to stand Progress towards PT goals: Progressing toward goals    Frequency    Min 2X/week      PT Plan Current plan remains appropriate    Co-evaluation              AM-PAC PT "6 Clicks" Mobility   Outcome Measure  Help needed turning from  your back to your side while in a flat bed without using bedrails?: A Lot Help needed moving from lying on your back to sitting on the side of a flat bed without using bedrails?: A Lot Help needed moving to and from a bed to a chair (including a wheelchair)?: Total Help needed standing up from a chair using your arms (e.g., wheelchair or bedside chair)?: Total Help needed to walk in hospital room?: Total Help needed climbing 3-5 steps with a railing? : Total 6 Click Score: 8    End of Session   Activity Tolerance: Patient limited by fatigue Patient left: in bed;with call bell/phone within reach;with bed alarm set   PT Visit Diagnosis: Muscle weakness (generalized) (M62.81)     Time: 0109-3235 PT Time Calculation (min) (ACUTE ONLY): 8 min  Charges:  $Therapeutic Exercise: 8-22 mins                         Doreatha Massed, PT Acute Rehabilitation  Office: [phone] Pager: [phone]

## 2021-01-08 NOTE — Progress Notes (Addendum)
Progress Note  Patient Name: Casey Gates. Date of Encounter: 01/08/2021  [facility] Cardiologist: Larae Grooms, MD   Subjective   BP 100-120/50-70s.  Stable kidney function.  Denies any dyspnea.  Inpatient Medications    Scheduled Meds: . amoxicillin-clavulanate  1 tablet Oral Q12H  . apixaban  2.5 mg Oral BID  . artificial tears  1 application Left Eye TID  . budesonide (PULMICORT) nebulizer solution  0.5 mg Nebulization BID  . chlorhexidine  15 mL Mouth Rinse BID  . Chlorhexidine Gluconate Cloth  6 each Topical Daily  . digoxin  0.0625 mg Oral Daily  . feeding supplement  237 mL Oral BID BM  . mouth rinse  15 mL Mouth Rinse q12n4p  . phosphorus  250 mg Oral TID   Continuous Infusions: . sodium chloride Stopped (01/02/21 1136)   PRN Meds: fentaNYL (SUBLIMAZE) injection, ipratropium-albuterol, lidocaine (PF), lip balm, loperamide, metoprolol tartrate, ondansetron **OR** ondansetron (ZOFRAN) IV, oxyCODONE, Resource ThickenUp Clear   Vital Signs    Vitals:   01/07/21 1947 01/07/21 2014 01/08/21 0436 01/08/21 1302  BP:  112/79 129/78 (!) 104/53  Pulse:  92 87 (!) 105  Resp:  18 18 18   Temp:  98.6 F (37 C) 97.8 F (36.6 C) 98.1 F (36.7 C)  TempSrc:  Oral Oral Oral  SpO2: 99% 98% 100% 95%  Weight:      Height:        Intake/Output Summary (Last 24 hours) at 01/08/2021 1338 Last data filed at 01/08/2021 1300 Gross per 24 hour  Intake 120 ml  Output 1500 ml  Net -1380 ml   Last 3 Weights 01/06/2021 12/31/2020 12/29/2020  Weight (lbs) 151 lb 7.3 oz 143 lb 8.3 oz 144 lb 2.9 oz  Weight (kg) 68.7 kg 65.1 kg 65.4 kg      Telemetry    Atrial fibrillation rates up to 130's- Personally Reviewed  ECG    n/a - Personally Reviewed  Physical Exam   VS:  BP (!) 104/53 (BP Location: Right Arm)   Pulse (!) 105   Temp 98.1 F (36.7 C) (Oral)   Resp 18   Ht 5\' 11"  (1.803 m)   Wt 68.7 kg   SpO2 95%   BMI 21.12 kg/m  , BMI Body mass index is 21.12  kg/m.  General appearance: alert and no distress Lungs: diminished breath sounds bilaterally Heart: irregular rhythm, tachycardic Abdomen: soft, non-tender Extremities: no edema  Skin: No rashes or lesions Neurologic: Mental status: Alert Psych: Pleasant  Labs    High Sensitivity Troponin:   Recent Labs  Lab 12/28/20 1720 12/31/20 0830  TROPONINIHS 14 9      Chemistry Recent Labs  Lab 01/06/21 0241 01/07/21 0604 01/08/21 0448  NA 141 142 139  K 3.9 4.9 4.3  CL 109 107 101  CO2 26 27 28   GLUCOSE 123* 76 79  BUN 24* 21 19  CREATININE 0.96 0.89 0.74  CALCIUM 8.1* 7.8* 7.7*  ALBUMIN  --  1.7* 1.8*  GFRNONAA >60 >60 >60  ANIONGAP 6 8 10      Hematology Recent Labs  Lab 01/05/21 0246 01/06/21 0241 01/07/21 0604 01/08/21 0448  WBC 9.0 12.7* 12.5*  --   RBC 4.21* 4.09* 4.59  --   HGB 11.6* 11.4* 12.8* 12.0*  HCT 36.7* 36.4* 40.8 39.0  MCV 87.2 89.0 88.9  --   MCH 27.6 27.9 27.9  --   MCHC 31.6 31.3 31.4  --   RDW 14.7  14.9 15.5  --   PLT 374 364 297  --     BNPNo results for input(s): BNP, PROBNP in the last 168 hours.   DDimer No results for input(s): DDIMER in the last 168 hours.   Radiology    No results found.  Cardiac Studies   Echo 12/30/20: 1. Left ventricular ejection fraction, by estimation, is 40 to 45%.  Difficult to determine systolic function as poor image quality and patient  is in Afib with RVR, but appears left ventricle has mildly decreased  function. Would consider repeat limited  echo with contrast once heart rate better controlled. Left ventricular  endocardial border not optimally defined to evaluate regional wall motion.  There is mild left ventricular hypertrophy. Left ventricular diastolic  parameters are indeterminate.  2. Right ventricular systolic function is mildly reduced. The right  ventricular size is mildly enlarged. There is normal pulmonary artery  systolic pressure. The estimated right ventricular systolic  pressure is  99.2 mmHg.  3. Left atrial size was moderately dilated.  4. The mitral valve is degenerative. No evidence of mitral valve  regurgitation. Moderate mitral annular calcification.  5. The aortic valve was not well visualized. Aortic valve regurgitation  is trivial. No aortic stenosis is present.  6. Aortic dilatation noted. There is mild dilatation of the aortic root,  measuring 42 mm. There is mild dilatation of the ascending aorta,  measuring 41 mm.  7. The inferior vena cava is dilated in size with <50% respiratory  variability, suggesting right atrial pressure of 15 mmHg.   Patient Profile     87 y.o. male with  asymptomatic coronary calcification, OSA, longstanding persistent atrial fibrillation, prior GI bleed (secondary to GIST) prostate cancer, frequent falls, mild aortic aneurysm, and anemia admitted with pneumonia 2 weeks after being positive for COVID-19. Hospitalization has been complicated by atrial fibrillation with RVR and hypotension.    Assessment & Plan    # Acute systolic and diastolic heart failure:  # Mixed cardiogenic/septic shock: LVEF 40 to 45% this admission, down from 65 to 70% previously. Mixed cardiogenic and septic shock improving.  He is now off of phenylephrine. Blood pressure appears improved - d/c midodrine yesterday. Will add low dose metoprolol  # Persistent atrial fibrillation: Rates are better controlled. Amiodarone was discontinued given that he remains in atrial fibrillation. Rates are around 100 bpm.  On digoxin and Eliquis. Continue. Add low dose BB as above.  # Recent COVID-19 infection:  # HAP, R-sided loculated pleural effusion:  Planning 14 day course of abx  For questions or updates, please contact [place] Please consult [email or web address] for contact info under    Donato Heinz, MD  01/08/2021, 1:38 PM

## 2021-01-08 NOTE — Progress Notes (Signed)
Hypoglycemic Event  CBG: 67  Treatment: 4 oz juice/soda  Symptoms: None  Follow-up CBG: [id number] CBG Result:88  Possible Reasons for Event: Unknown  Comments/MD notified: MD Margot Ables

## 2021-01-08 NOTE — Progress Notes (Addendum)
PROGRESS NOTE    Casey Gates.  [id number] DOB: 1926-11-23 DOA: 12/17/2020 PCP: Velna Hatchet, MD    Chief Complaint  Patient presents with  . Urinary Tract Infection    Brief Narrative:  Patient 87 year old gentleman initially admitted 12/17/2020 with pneumonia and associated right-sided loculated pleural effusion.  Patient completed full course of antibiotics however continued to have worsening loculated right pleural effusion with evolution of air/gas fluid level.  Right chest tube placed and patient completed intrapleural fibrinolytic therapy. Patient subsequently noted to go into septic shock with concerns for mild adrenal insufficiency and transferred to the ICU and subsequently placed on pressors as well as stress dose steroids.  Patient had a subsequent second chest tube placed by IR in the right lateral superior loculated pleural fluid.  Patient also on midodrine.  Patient also noted to go into A. fib with RVR cardiology consulted patient initially on amiodarone due to shock and subsequently transition to digoxin.  Cardiology following. Pressors weaned off.  Patient subsequently transferred to [facility].  PCCM following for chest tube management.   Assessment & Plan:   Principal Problem:   Acute respiratory failure with hypoxia (HCC) Active Problems:   Multifocal pneumonia   Chronic pain   Urinary retention   Septic shock (HCC)   Prostate cancer (HCC)   Atrial fibrillation with RVR (HCC)   Acute metabolic encephalopathy   Hypernatremia   Empyema (HCC)   DNR (do not resuscitate)   Goals of care, counseling/discussion   Shock circulatory ([name])   Large pleural effusion   Acute on chronic combined systolic and diastolic CHF (congestive heart failure) ([place])   1 acute hypoxemic respiratory failure secondary to pneumonia with empyema/OSA/right-sided pneumothorax Patient with clinical improvement.  Currently on room air.  Status post chest tube placement x2.   Apical chest tube discontinued 01/04/2021.  Right chest tube discontinued 01/05/2021.  CT chest done concerning for increased right hydropneumothorax, stable moderate left pleural effusion, stable right middle lobe and lower lobe opacities, interval development of subcutaneous emphysema involving right posterior chest wall.  Chest x-ray from today with persistent 5 to 10% right-sided pneumothorax which is stable.  Patient denies any significant shortness of breath.  Continue pulmonary hygiene, mobilization.  Continue Pulmicort.  Continue Augmentin.  PT/OT.  PCCM.   2.  Right empyema secondary to PNA Concern that patient's right empyema may be in the setting of aspiration. Status post chest tube placement per IR 12/26/2020 with completion of fibrinolytics.  Patient with apical chest tube placed to 12/21/2020 for residual loculated empyema.  Apical chest tube discontinued 01/04/2021.  Right chest tube discontinued 01/05/2021 per PCCM.  CT chest (01/04/2021) with stable position right-sided pigtail drainage catheter seen in right hemithorax, significantly increased amount of air noted in the upper lung zone consistent with increased right hydropneumothorax, stable right small pleural effusion.  Stable moderate left pleural effusion noted with associated atelectasis or infiltrate in the left lower lobe, stable right middle lobe and lower lobe opacities noted concerning for atelectasis or infiltrates, interval development of subcutaneous emphysema involving right posterior chest wall.  Repeat chest x-ray with persistent 5 to 10% right-sided pneumothorax which has been stable. IV antibiotics have been transitioned to oral Augmentin with PCCM recommending 14-day course of antibiotics, through 01/16/2021.  PCCM recommending repeat CT chest on 01/16/2021 and if still residual disease/persistent signs of infection, if unresolved infiltrates will need an extra week of Augmentin.  3.  Septic shock/adrenal insufficiency Patient was  transferred to the  ICU 12/28/2018.  Has noted to be in septic shock requiring pressors.  Patient also placed on stress dose steroids.  Cortisol level noted at 16.1.  Patient has been weaned off midodrine and stress dose steroids.  Systolic blood pressure in the 120s.  Currently on oral antibiotics.  Follow.   4.  Persistent atrial fibrillation Patient initially was on amiodarone for management of A. fib as patient also noted to be in septic shock.  Amiodarone discontinued and patient placed on digoxin for better rate control.  Heart rate improved.  Blood pressure improved on midodrine and IV Solu-Cortef. Diltiazem currently on hold.  Midodrine and stress dose steroids have been discontinued.  Was on IV heparin and transition to Eliquis 01/04/2021.  Electrolytes repleted.  Keep magnesium greater than 2.  Keep potassium greater than 4.  Per cardiology.  5.  Acute systolic and diastolic heart failure/mixed cardiogenic shock 2D echo done with EF of 40 to 45% down from 65 to 70%.  Patient required pressors early on during the hospitalization which have subsequently been weaned off.  Urine output of 1.3 L over the past 24 hours.  Blood pressure stable with systolics of [id number] this morning.  Currently off midodrine and stress dose steroids.  On digoxin.  Cardiology following and adjusting cardiac medications.   6.  Acute metabolic encephalopathy Secondary to sepsis.  Resolved.  Follow.  7.  Hypophosphatemia/hypokalemia Potassium of 4.3.  Phosphorus at 3.1.  Follow.   8.  Chronic pain Stable.  Patient noted to be on Opana and Neurontin as needed prior to admission.  Continue oxycodone 5 mg p.o. every 6 hours as needed pain.  9.  Moderate protein calorie malnutrition Dysphagia 3 diet.  Follow.  10.  History of prostate cancer/urinary retention Foley catheter replaced on 01/03/2021.  Urine output 1.3 L over the past 24 hours.  Patient underwent voiding trial which he failed yesterday and Foley catheter placed  back pain per urology.  Will need outpatient follow-up with urology.   11.  Anemia Patient with no overt bleeding.  Hemoglobin stable at 12.0.  Transfusion threshold hemoglobin < 7.      DVT prophylaxis: Heparin>>>> Eliquis Code Status: DNR Family Communication: Updated patient.  Updated son at bedside.  Disposition:   Status is: Inpatient    Dispo: The patient is from: ALF              Anticipated d/c is to: SNF              Anticipated d/c date is: 01/10/2021              Patient currently being weaned off midodrine and IV stress dose steroids.  Medications being adjusted per cardiology not stable for discharge.    Difficult to place patient undetermined       Consultants:   PCCM: Dr. Chase Caller 12/25/2020  Cardiology: Dr. Johney Frame 12/19/2020  Urology: Dr. Everlena Cooper 01/07/2021 for difficult Foley catheter placement.  Significant Hospital Events:  1/16 Admit, started on broad spectrum abx 1/17 Thoracentesis with exudative pleural effusion, 215ml hazy amber fluid removed 1/18 AFwRVR, Cardiology consulted 1/19 Completed azithromycin  1/23 Completed ceftriaxone  1/24 PCCM consulted  1/26 R chest tube with intraplural tPA / dornase instilled  1/28 Completed 3rd dose intrapleural fibrinolytic therapy  1/29 [place] O2, afebrile with normal mental status. Labile rhythm / BP on low dose neosynephrine   1/30 [place] O2, cortisol 16.1, on stress dose steroids on neo. On dig/amio for AF.  2/01 Anterior  chest tube placed per IR into R lateral superior loculated pleural fluid  2/03 Apical pocket of fluid cleared on CXR, apical chest tube discontinued 2/04 persistent 5- 10% right-sided pneumothorax 2/4/right chest tube removed     Procedures:   CT Chest 1/24 >> moderate to large loculated right pleural effusion is increased in volume when compared to 1/16.  New focus of gas with air-fluid level in the posterior compartment of the effusion.  Progressive atelectasis of the entire right lower lobe  with partial atelectasis of the right middle lobe and right upper lobe.  Progressive consolidation involving the posterior medial left lower lobe containing air bronchograms. CT Chest w/o 2/3 >> 1. Stable position of right-sided pigtail drainage catheter seen in right hemithorax. Significantly increased amount of air is noted in the upper lung zone consistent with increased right hydropneumothorax. Stable small right pleural effusion is noted. 2. Stable moderate left pleural effusion is noted with associated atelectasis or infiltrate of the left lower lobe. 3. Stable right middle lobe and lower lobe opacities are noted concerning for atelectasis or infiltrates. 4. Interval development of subcutaneous emphysema involving the right posterior chest wall. 5. Coronary artery calcifications are noted. 6. 4.3 cm ascending thoracic aortic aneurysm is noted.  7. Aortic atherosclerosis.  R lateral chest tube 1/25 per IR>>>  Right anterior chest tube 2/1 >>>    Antimicrobials:  Azithro 1/16 >> 1/20  Ceftriaxone 1/16 >> 1/23  Cefepime 1/24 >> 1/27  Vanco 1/24 >> 1/31 Meropenem 1/29 >>  2/4 Zyvox 1/31>> 2/4 Augmentin 01/05/2021>>> 01/16/2021  Micro Data:  COVID 1/16 >> negative  BCx2 1/16 >> negative  Pleural fluid 1/17 >> negative  MRSA PCR 1/18 >> positive  Chest tube aspirate 1/25 >> negative  BCx2 1/27 >> negative  UC 1/27 >> negative     Subjective: Patient laying in bed.  Denies any chest pain.  No shortness of breath.  No abdominal pain.  Stated only want to drink Ensure for lunch today and was not hungry.  Blood pressure remained stable.  No family at bedside.  Objective: Vitals:   01/07/21 1345 01/07/21 1947 01/07/21 2014 01/08/21 0436  BP: 121/63  112/79 129/78  Pulse: 80  92 87  Resp: 17  18 18   Temp: 97.6 F (36.4 C)  98.6 F (37 C) 97.8 F (36.6 C)  TempSrc: Oral  Oral Oral  SpO2: 99% 99% 98% 100%  Weight:      Height:        Intake/Output Summary (Last 24  hours) at 01/08/2021 1217 Last data filed at 01/08/2021 0600 Gross per 24 hour  Intake 120 ml  Output 1300 ml  Net -1180 ml   Filed Weights   12/29/20 0615 12/31/20 0630 01/06/21 2140  Weight: 65.4 kg 65.1 kg 68.7 kg    Examination:  General exam: NAD Respiratory system: Some coarse breath sounds in the right base otherwise clear.  No wheezing.  Fair air movement.  Speaking in full sentences.  Cardiovascular system: Irregularly irregular.  No JVD.  No lower extremity edema.  Gastrointestinal system: Abdomen is soft, nontender, nondistended, positive bowel sounds.  No rebound.  No guarding.  Central nervous system: Alert and oriented. No focal neurological deficits. Extremities: Symmetric 5 x 5 power. Skin: No rashes, lesions or ulcers Psychiatry: Judgement and insight appear normal. Mood & affect appropriate.     Data Reviewed: I have personally reviewed following labs and imaging studies  CBC: Recent Labs  Lab 01/03/21 0252 01/04/21 0253  01/05/21 0246 01/06/21 0241 01/07/21 0604 01/08/21 0448  WBC 10.5 9.5 9.0 12.7* 12.5*  --   NEUTROABS  --   --   --  11.0* 9.7*  --   HGB 13.0 11.7* 11.6* 11.4* 12.8* 12.0*  HCT 41.4 36.9* 36.7* 36.4* 40.8 39.0  MCV 88.7 86.8 87.2 89.0 88.9  --   PLT 390 367 374 364 297  --     Basic Metabolic Panel: Recent Labs  Lab 01/03/21 0252 01/04/21 0253 01/05/21 0246 01/06/21 0241 01/07/21 0604 01/08/21 0448  NA 137 136 140 141 142 139  K 4.6 4.2 3.2* 3.9 4.9 4.3  CL 105 104 104 109 107 101  CO2 25 24 25 26 27 28   GLUCOSE 109* 126* 111* 123* 76 79  BUN 22 23 23  24* 21 19  CREATININE 0.82 0.79 0.91 0.96 0.89 0.74  CALCIUM 8.0* 7.8* 7.9* 8.1* 7.8* 7.7*  MG 2.1  --  2.1 2.2 2.0 2.2  PHOS 2.9 2.6 2.6 1.8* 3.6 3.1    GFR: Estimated Creatinine Clearance: 57.3 mL/min (by C-G formula based on SCr of 0.74 mg/dL).  Liver Function Tests: Recent Labs  Lab 01/07/21 0604 01/08/21 0448  ALBUMIN 1.7* 1.8*    CBG: Recent Labs  Lab  01/07/21 2358 01/08/21 0434 01/08/21 0752 01/08/21 0840 01/08/21 1149  GLUCAP 116* 110* 67* 88 87     No results found for this or any previous visit (from the past 240 hour(s)).       Radiology Studies: No results found.      Scheduled Meds: . amoxicillin-clavulanate  1 tablet Oral Q12H  . apixaban  2.5 mg Oral BID  . artificial tears  1 application Left Eye TID  . budesonide (PULMICORT) nebulizer solution  0.5 mg Nebulization BID  . chlorhexidine  15 mL Mouth Rinse BID  . Chlorhexidine Gluconate Cloth  6 each Topical Daily  . digoxin  0.0625 mg Oral Daily  . feeding supplement  237 mL Oral BID BM  . mouth rinse  15 mL Mouth Rinse q12n4p  . phosphorus  250 mg Oral TID   Continuous Infusions: . sodium chloride Stopped (01/02/21 1136)     LOS: 22 days    Time spent: 40 minutes    Irine Seal, MD [facility]   To contact the attending provider between 7A-7P or the covering provider during after hours 7P-7A, please log into the web site [email or web address] and access using universal [place] password for that web site. If you do not have the password, please call the hospital operator.  01/08/2021, 12:17 PM

## 2021-01-08 NOTE — TOC Initial Note (Signed)
Transition of Care (TOC) - Initial/Assessment Note    Patient Details  Name: Casey Gates. MRN: [id number] Date of Birth: [birth year]  Transition of Care [facility]) CM/SW Contact:    Casey Phi, RN Phone Number: 01/08/2021, 1:18 PM  Clinical Narrative: Provided bed offer to sn [facility],even expanded SNF search, currently he has chosen [facility], rep Casey Gates today but will talk to son tomorrow about [facility]. Will need to start auth once facility is confirmed.                  Expected Discharge Plan: Skilled Nursing Facility Barriers to Discharge: Continued Medical Work up   Patient Goals and CMS Choice Patient states their goals for this hospitalization and ongoing recovery are:: go to rehab CMS Medicare.gov Compare Post Acute Care list provided to:: Patient Represenative (must comment) Choice offered to / list presented to : Adult Children  Expected Discharge Plan and Services Expected Discharge Plan: [place]   Discharge Planning Services: CM Consult   Living arrangements for the past 2 months: Single Family Home                                      Prior Living Arrangements/Services Living arrangements for the past 2 months: Single Family Home Lives with:: Adult Children,Spouse Patient language and need for interpreter reviewed:: Yes Do you feel safe going back to the place where you live?: Yes      Need for Family Participation in Patient Care: No (Comment) Care giver support system in place?: Yes (comment)   Criminal Activity/Legal Involvement Pertinent to Current Situation/Hospitalization: No - Comment as needed  Activities of Daily Living Home Assistive Devices/Equipment: Eyeglasses,Blood pressure cuff,Grab bars around toilet,Grab bars in shower,Hand-held shower hose,Walker (specify type),Scales,Other (Comment) (rolling walker, transport chair) ADL Screening (condition at time of admission) Patient's cognitive ability adequate to  safely complete daily activities?: No Is the patient deaf or have difficulty hearing?: Yes Does the patient have difficulty seeing, even when wearing glasses/contacts?: No Does the patient have difficulty concentrating, remembering, or making decisions?: Yes Patient able to express need for assistance with ADLs?: Yes Does the patient have difficulty dressing or bathing?: Yes Independently performs ADLs?: No Communication: Independent Dressing (OT): Dependent Is this a change from baseline?: Change from baseline, expected to last >3 days Grooming: Dependent Is this a change from baseline?: Change from baseline, expected to last >3 days Feeding: Dependent Is this a change from baseline?: Change from baseline, expected to last >3 days Bathing: Dependent Is this a change from baseline?: Change from baseline, expected to last >3 days Toileting: Dependent Is this a change from baseline?: Change from baseline, expected to last >3days In/Out Bed: Dependent Is this a change from baseline?: Change from baseline, expected to last >3 days Walks in Home: Dependent Is this a change from baseline?: Change from baseline, expected to last >3 days Does the patient have difficulty walking or climbing stairs?: Yes (secondary to weakness) Weakness of Legs: Both Weakness of Arms/Hands: Both  Permission Sought/Granted Permission sought to share information with : Case Manager Permission granted to share information with : Yes, Verbal Permission Granted  Share Information with NAME: Case Manager     Permission granted to share info w Relationship: Casey Gates son [phone]     Emotional Assessment Appearance:: Appears stated age Attitude/Demeanor/Rapport: Unable to Assess Affect (typically observed): Unable to Assess Orientation: :  Fluctuating Orientation (Suspected and/or reported Sund[place]) Alcohol / Substance Use: Not Applicable Psych Involvement: No (comment)  Admission diagnosis:  Lung mass  [R91.8] SOB (shortness of breath) [R06.02] S/P thoracentesis [Z98.890] Atrial fibrillation with RVR (Casey Gates) [I48.91] Patient Active Problem List   Diagnosis Date Noted  . Acute on chronic combined systolic and diastolic CHF (congestive heart failure) ([place])   . Acute respiratory failure with hypoxia ([place]) 01/04/2021  . Large pleural effusion   . DNR (do not resuscitate) 12/31/2020  . Shock circulatory ([place])   . Goals of care, counseling/discussion 12/29/2020  . Empyema ([place])   . Atrial fibrillation with RVR ([place]) 12/17/2020  . Acute metabolic encephalopathy 30/16/0109  . Hypernatremia 12/17/2020  . Coagulation defect ([place]) 09/27/2020  . Back pain, chronic 12/14/2018  . Edema 12/14/2018  . Prostate cancer ([place]) 12/14/2018  . History of amputation of lesser toe ([place]) 11/16/2018  . Chronic ulcer of right foot with fat layer exposed ([place]) 11/05/2018  . Contusion of right hip region 07/24/2018  . Multifocal pneumonia 03/18/2018  . Septic shock ([place]) 03/18/2018  . Malignant gastrointestinal stromal tumor (GIST) of stomach ([place]) 03/12/2018  . Urinary retention 02/24/2018  . Gastrointestinal stromal tumor (GIST) of stomach ([place])   . Restless leg syndrome   . Acute GI bleeding 02/16/2018  . Acute blood loss anemia 02/16/2018  . Chronic low back pain 02/05/2018  . Degeneration of lumbar intervertebral disc 02/05/2018  . Constipation 01/20/2018  . Exertional shortness of breath 12/30/2017  . Rupture of left quadriceps muscle 07/07/2017  . Rupture of left quadriceps tendon 06/26/2017  . Eczema 06/26/2017  . Chest pain 07/29/2015  . OSA on CPAP 07/10/2015  . CPAP use counseling 07/10/2015  . Excessive daytime sleepiness 02/22/2015  . Sleep apnea 02/22/2015  . Snoring 02/22/2015  . Nocturia more than twice per night 02/22/2015  . Syncope 12/31/2012  . Bifascicular bundle branch block 12/31/2012    Class: Acute  . Cellulitis and abscess of leg, except foot 12/04/2012  . Chronic pain  12/04/2012  . Kyphosis 12/04/2012  . H/O unilateral nephrectomy 12/04/2012  . H/O prostate cancer 12/04/2012  . S/P TURP 12/04/2012   PCP:  Casey Hatchet, MD Pharmacy:   [id number] [place], [place] - [place] [address] [place] [address] Phone: [phone] Fax: [place], [place] - [place] [address] [place] [address] Phone: [phone] Fax: [phone]  [place], [place] - [place] [address] Phone: [phone] Fax: [phone]     Social Determinants of Health (SDOH) Interventions    Readmission Risk Interventions No flowsheet data found.

## 2021-01-09 DIAGNOSIS — I4891 Unspecified atrial fibrillation: Secondary | ICD-10-CM | POA: Diagnosis not present

## 2021-01-09 DIAGNOSIS — I5043 Acute on chronic combined systolic (congestive) and diastolic (congestive) heart failure: Secondary | ICD-10-CM | POA: Diagnosis not present

## 2021-01-09 LAB — GLUCOSE, CAPILLARY
Glucose-Capillary: 80 mg/dL (ref 70–99)
Glucose-Capillary: 75 mg/dL (ref 70–99)
Glucose-Capillary: 131 mg/dL — ABNORMAL HIGH (ref 70–99)
Glucose-Capillary: 112 mg/dL — ABNORMAL HIGH (ref 70–99)
Glucose-Capillary: 168 mg/dL — ABNORMAL HIGH (ref 70–99)

## 2021-01-09 LAB — BASIC METABOLIC PANEL
Anion gap: 7 (ref 5–15)
BUN: 18 mg/dL (ref 8–23)
CO2: 31 mmol/L (ref 22–32)
Calcium: 7.5 mg/dL — ABNORMAL LOW (ref 8.9–10.3)
Chloride: 99 mmol/L (ref 98–111)
Creatinine, Ser: 0.69 mg/dL (ref 0.61–1.24)
GFR, Estimated: >60 mL/min (ref >60)
Glucose, Bld: 84 mg/dL (ref 70–99)
Potassium: 3.9 mmol/L (ref 3.5–5.1)
Sodium: 137 mmol/L (ref 135–145)

## 2021-01-09 LAB — HEMOGLOBIN AND HEMATOCRIT, BLOOD
HCT: 36.7 % — ABNORMAL LOW (ref 39.0–52.0)
Hemoglobin: 11.6 g/dL — ABNORMAL LOW (ref 13.0–17.0)

## 2021-01-09 LAB — SARS CORONAVIRUS 2 (TAT 6-24 HRS): SARS Coronavirus 2: NEGATIVE

## 2021-01-09 LAB — MAGNESIUM: Magnesium: 2 mg/dL (ref 1.7–2.4)

## 2021-01-09 MED ORDER — METOPROLOL TARTRATE 25 MG PO TABS
25.0000 mg | ORAL_TABLET | Freq: Two times a day (BID) | ORAL | Status: DC
  Administered 2021-01-09: 25 mg via ORAL
  Filled 2021-01-09: qty 1

## 2021-01-09 NOTE — TOC Progression Note (Deleted)
Transition of Care (TOC) - Progression Note    Patient Details  Name: Casey Gates. MRN: [id number] Date of Birth: 02/10/1926  Transition of Care [facility] CM/SW Contact  Shabria Egley, Juliann Pulse, RN Phone Number: 01/09/2021, 10:45 AM  Clinical Narrative:   Attempted to contact son Casey Gates listed-all tel#'s non functioning. Will provide bed offers once able to talk to a family member-MD notified.    Expected Discharge Plan: [place] Barriers to Discharge: Continued Medical Work up  Expected Discharge Plan and Services Expected Discharge Plan: [place]   Discharge Planning Services: CM Consult   Living arrangements for the past 2 months: Single Family Home                                       Social Determinants of Health (SDOH) Interventions    Readmission Risk Interventions No flowsheet data found.

## 2021-01-09 NOTE — Progress Notes (Signed)
PROGRESS NOTE    [name].  FGH:[id number] DOB: 1926/03/11 DOA: 12/17/2020 PCP: Velna Hatchet, MD    Chief Complaint  Patient presents with  . Urinary Tract Infection    Brief Narrative:  Patient 87 year old gentleman initially admitted 12/17/2020 with pneumonia and associated right-sided loculated pleural effusion.  Patient completed full course of antibiotics however continued to have worsening loculated right pleural effusion with evolution of air/gas fluid level.  Right chest tube placed and patient completed intrapleural fibrinolytic therapy. Patient subsequently noted to go into septic shock with concerns for mild adrenal insufficiency and transferred to the ICU and subsequently placed on pressors as well as stress dose steroids.  Patient had a subsequent second chest tube placed by IR in the right lateral superior loculated pleural fluid.  Patient also on midodrine.  Patient also noted to go into A. fib with RVR cardiology consulted patient initially on amiodarone due to shock and subsequently transition to digoxin.  Cardiology following. Pressors weaned off.  Patient subsequently transferred to [facility].  PCCM following for chest tube management.   Assessment & Plan:   Principal Problem:   Acute respiratory failure with hypoxia (HCC) Active Problems:   Multifocal pneumonia   Chronic pain   Urinary retention   Septic shock (HCC)   Prostate cancer (HCC)   Atrial fibrillation with RVR (HCC)   Acute metabolic encephalopathy   Hypernatremia   Empyema (HCC)   DNR (do not resuscitate)   Goals of care, counseling/discussion   Shock circulatory ([place])   Large pleural effusion   Acute on chronic combined systolic and diastolic CHF (congestive heart failure) ([place])   1 acute hypoxemic respiratory failure secondary to pneumonia with empyema/OSA/right-sided pneumothorax Patient with clinical improvement.  Currently on room air.  Status post chest tube placement x2.   Apical chest tube discontinued 01/04/2021.  Right chest tube discontinued 01/05/2021.  CT chest done concerning for increased right hydropneumothorax, stable moderate left pleural effusion, stable right middle lobe and lower lobe opacities, interval development of subcutaneous emphysema involving right posterior chest wall.  Chest x-ray  with persistent 5 to 10% right-sided pneumothorax which is stable.  Patient denies any significant shortness of breath.  Continue pulmonary hygiene, mobilization.  Continue Pulmicort.  Continue Augmentin.  Outpatient follow-up with pulmonary.  PT/OT.  PCCM.   2.  Right empyema secondary to PNA Concern that patient's right empyema may be in the setting of aspiration. Status post chest tube placement per IR 12/26/2020 with completion of fibrinolytics.  Patient with apical chest tube placed to 12/21/2020 for residual loculated empyema.  Apical chest tube discontinued 01/04/2021.  Right chest tube discontinued 01/05/2021 per PCCM.  CT chest (01/04/2021) with stable position right-sided pigtail drainage catheter seen in right hemithorax, significantly increased amount of air noted in the upper lung zone consistent with increased right hydropneumothorax, stable right small pleural effusion.  Stable moderate left pleural effusion noted with associated atelectasis or infiltrate in the left lower lobe, stable right middle lobe and lower lobe opacities noted concerning for atelectasis or infiltrates, interval development of subcutaneous emphysema involving right posterior chest wall.  Repeat chest x-ray with persistent 5 to 10% right-sided pneumothorax which has been stable. IV antibiotics have been transitioned to oral Augmentin with PCCM recommending 14-day course of antibiotics, through 01/16/2021.  PCCM recommending repeat CT chest on 01/16/2021 and if still residual disease/persistent signs of infection, if unresolved infiltrates will need an extra week of Augmentin. Outpatient follow-up with  pulmonary.  3.  Septic shock/adrenal insufficiency Patient was transferred to the ICU 12/28/2018.  Has noted to be in septic shock requiring pressors.  Patient also placed on stress dose steroids.  Cortisol level noted at 16.1.  Patient has been weaned off midodrine and stress dose steroids.  Systolic blood pressure in the 110s.  Currently on oral antibiotics.  Follow.   4.  Persistent atrial fibrillation Patient initially was on amiodarone for management of A. fib as patient also noted to be in septic shock.  Amiodarone discontinued and patient placed on digoxin for better rate control.  Heart rate improved.  Blood pressure improved on midodrine and IV Solu-Cortef. Diltiazem currently on hold.  Currently rate controlled on digoxin and Lopressor which was started yesterday and being adjusted per cardiology.  Midodrine and stress dose steroids have been discontinued.  Was on IV heparin and transition to Eliquis 01/04/2021.  Electrolytes repleted.  Keep magnesium greater than 2.  Keep potassium greater than 4.  Per cardiology.  5.  Acute systolic and diastolic heart failure/mixed cardiogenic shock 2D echo done with EF of 40 to 45% down from 65 to 70%.  Patient required pressors early on during the hospitalization which have subsequently been weaned off.  Urine output of 1.825 L over the past 24 hours.  Blood pressure stable with systolics of 416S this morning.  Currently off midodrine and stress dose steroids.  On digoxin and Lopressor.  Per cardiology.   6.  Acute metabolic encephalopathy Secondary to sepsis.  Resolved.  Currently at baseline.  Follow.  7.  Hypophosphatemia/hypokalemia Potassium 3.9.  Phosphorus at 3.1.  Magnesium at 2.0.  Repeat labs in the morning.   8.  Chronic pain Stable.  Patient noted to be on Opana and Neurontin as needed prior to admission.  Continue oxycodone 5 mg p.o. every 6 hours as needed pain.  9.  Moderate protein calorie malnutrition Dysphagia 3 diet.   Follow.  10.  History of prostate cancer/urinary retention Foley catheter replaced on 01/03/2021.  Urine output 1.825 L over the past 24 hours.  Patient underwent voiding trial which he failed on 01/07/2021, and Foley catheter placed back pain per urology.  Outpatient follow-up with urology.    11.  Anemia Patient with no overt bleeding.  Hemoglobin stable at 11.6.  Transfusion threshold hemoglobin < 7.     DVT prophylaxis: Heparin>>>> Eliquis Code Status: DNR Family Communication: Updated patient.  Updated son at bedside.  Disposition:   Status is: Inpatient    Dispo: The patient is from: ALF              Anticipated d/c is to: SNF              Anticipated d/c date is: 01/10/2021              Patient currently not stable for discharge.   Difficult to place patient undetermined       Consultants:   PCCM: Dr. Chase Caller 12/25/2020  Cardiology: Dr. Johney Frame 12/19/2020  Urology: Dr. Everlena Cooper 01/07/2021 for difficult Foley catheter placement.  Significant Hospital Events:  1/16 Admit, started on broad spectrum abx 1/17 Thoracentesis with exudative pleural effusion, 252ml hazy amber fluid removed 1/18 AFwRVR, Cardiology consulted 1/19 Completed azithromycin  1/23 Completed ceftriaxone  1/24 PCCM consulted  1/26 R chest tube with intraplural tPA / dornase instilled  1/28 Completed 3rd dose intrapleural fibrinolytic therapy  1/29 [place] O[facility], afebrile with normal mental status. Labile rhythm / BP on low dose neosynephrine   1/30  [place] [facility], cortisol 16.1, on stress dose steroids on neo. On dig/amio for AF.  2/01 Anterior chest tube placed per IR into R lateral superior loculated pleural fluid  2/03 Apical pocket of fluid cleared on CXR, apical chest tube discontinued 2/04 persistent 5- 10% right-sided pneumothorax 2/4/right chest tube removed     Procedures:   CT Chest 1/24 >> moderate to large loculated right pleural effusion is increased in volume when compared to 1/16.  New focus of gas  with air-fluid level in the posterior compartment of the effusion.  Progressive atelectasis of the entire right lower lobe with partial atelectasis of the right middle lobe and right upper lobe.  Progressive consolidation involving the posterior medial left lower lobe containing air bronchograms. CT Chest w/o 2/3 >> 1. Stable position of right-sided pigtail drainage catheter seen in right hemithorax. Significantly increased amount of air is noted in the upper lung zone consistent with increased right hydropneumothorax. Stable small right pleural effusion is noted. 2. Stable moderate left pleural effusion is noted with associated atelectasis or infiltrate of the left lower lobe. 3. Stable right middle lobe and lower lobe opacities are noted concerning for atelectasis or infiltrates. 4. Interval development of subcutaneous emphysema involving the right posterior chest wall. 5. Coronary artery calcifications are noted. 6. 4.3 cm ascending thoracic aortic aneurysm is noted.  7. Aortic atherosclerosis.  R lateral chest tube 1/25 per IR>>>  Right anterior chest tube 2/1 >>>    Antimicrobials:  Azithro 1/16 >> 1/20  Ceftriaxone 1/16 >> 1/23  Cefepime 1/24 >> 1/27  Vanco 1/24 >> 1/31 Meropenem 1/29 >>  2/4 Zyvox 1/31>> 2/4 Augmentin 01/05/2021>>> 01/16/2021  Micro Data:  COVID 1/16 >> negative  BCx2 1/16 >> negative  Pleural fluid 1/17 >> negative  MRSA PCR 1/18 >> positive  Chest tube aspirate 1/25 >> negative  BCx2 1/27 >> negative  UC 1/27 >> negative     Subjective: Laying in bed.  No chest pain.  No shortness of breath.  No abdominal pain.  Son at bedside.    Objective: Vitals:   01/08/21 1302 01/08/21 2031 01/09/21 0441 01/09/21 0949  BP: (!) 104/53 106/72 126/68 125/64  Pulse: (!) 105 88 92 88  Resp: 18 20 18    Temp: 98.1 F (36.7 C) 97.7 F (36.5 C) 98.7 F (37.1 C)   TempSrc: Oral Oral Oral   SpO2: 95% 95% 93%   Weight:      Height:        Intake/Output  Summary (Last 24 hours) at 01/09/2021 1145 Last data filed at 01/09/2021 0900 Gross per 24 hour  Intake 240 ml  Output 1325 ml  Net -1085 ml   Filed Weights   12/29/20 0615 12/31/20 0630 01/06/21 2140  Weight: 65.4 kg 65.1 kg 68.7 kg    Examination:  General exam: NAD Respiratory system: CTA B anterior lung fields.  No wheezes, no crackles, no rhonchi.  Fair air movement.  Speaking in full sentences.   Cardiovascular system: Irregularly irregular.  No JVD.  No lower extremity edema.  I Gastrointestinal system: Abdomen is soft, nontender, nondistended, positive bowel sounds.  No rebound.  No guarding.   Central nervous system: Alert and oriented. No focal neurological deficits. Extremities: Symmetric 5 x 5 power. Skin: No rashes, lesions or ulcers Psychiatry: Judgement and insight appear normal. Mood & affect appropriate.     Data Reviewed: I have personally reviewed following labs and imaging studies  CBC: Recent Labs  Lab 01/03/21 0252 01/04/21  1610 01/05/21 0246 01/06/21 0241 01/07/21 0604 01/08/21 0448 01/09/21 0502  WBC 10.5 9.5 9.0 12.7* 12.5*  --   --   NEUTROABS  --   --   --  11.0* 9.7*  --   --   HGB 13.0 11.7* 11.6* 11.4* 12.8* 12.0* 11.6*  HCT 41.4 36.9* 36.7* 36.4* 40.8 39.0 36.7*  MCV 88.7 86.8 87.2 89.0 88.9  --   --   PLT 390 367 374 364 297  --   --     Basic Metabolic Panel: Recent Labs  Lab 01/04/21 0253 01/05/21 0246 01/06/21 0241 01/07/21 0604 01/08/21 0448 01/09/21 0502  NA 136 140 141 142 139 137  K 4.2 3.2* 3.9 4.9 4.3 3.9  CL 104 104 109 107 101 99  CO2 24 25 26 27 28 31   GLUCOSE 126* 111* 123* 76 79 84  BUN 23 23 24* 21 19 18   CREATININE 0.79 0.91 0.96 0.89 0.74 0.69  CALCIUM 7.8* 7.9* 8.1* 7.8* 7.7* 7.5*  MG  --  2.1 2.2 2.0 2.2 2.0  PHOS 2.6 2.6 1.8* 3.6 3.1  --     GFR: Estimated Creatinine Clearance: 57.3 mL/min (by C-G formula based on SCr of 0.69 mg/dL).  Liver Function Tests: Recent Labs  Lab 01/07/21 0604  01/08/21 0448  ALBUMIN 1.7* 1.8*    CBG: Recent Labs  Lab 01/08/21 1149 01/08/21 1617 01/08/21 2031 01/09/21 0440 01/09/21 0737  GLUCAP 87 98 126* 80 75     No results found for this or any previous visit (from the past 240 hour(s)).       Radiology Studies: No results found.      Scheduled Meds: . amoxicillin-clavulanate  1 tablet Oral Q12H  . apixaban  2.5 mg Oral BID  . artificial tears  1 application Left Eye TID  . budesonide (PULMICORT) nebulizer solution  0.5 mg Nebulization BID  . chlorhexidine  15 mL Mouth Rinse BID  . Chlorhexidine Gluconate Cloth  6 each Topical Daily  . digoxin  0.0625 mg Oral Daily  . feeding supplement  237 mL Oral BID BM  . mouth rinse  15 mL Mouth Rinse q12n4p  . metoprolol tartrate  12.5 mg Oral BID   Continuous Infusions: . sodium chloride Stopped (01/02/21 1136)     LOS: 23 days    Time spent: 35 minutes    Irine Seal, MD [facility]   To contact the attending provider between 7A-7P or the covering provider during after hours 7P-7A, please log into the web site [email or web address] and access using universal [place] password for that web site. If you do not have the password, please call the hospital operator.  01/09/2021, 11:45 AM

## 2021-01-09 NOTE — TOC Progression Note (Signed)
Transition of Care (TOC) - Progression Note    Patient Details  Name: Casey Gates. MRN: [id number] Date of Birth: January 19, 1926  Transition of Care [facility]) CM/SW Contact  Sascha Baugher, Juliann Pulse, RN Phone Number: 01/09/2021, 1:00 PM  Clinical Narrative: [name] son confirmed choice for SNF-[facility]-initiated auth-rep [name] following. covid ordered.      Expected Discharge Plan: Skilled Nursing Facility Barriers to Discharge: Insurance Authorization  Expected Discharge Plan and Services Expected Discharge Plan: [place]   Discharge Planning Services: CM Consult   Living arrangements for the past 2 months: Single Family Home                                       Social Determinants of Health (SDOH) Interventions    Readmission Risk Interventions No flowsheet data found.

## 2021-01-09 NOTE — TOC Progression Note (Signed)
Transition of Care (TOC) - Progression Note    Patient Details  Name: Casey Gates. MRN: [id number] Date of Birth: 11-Aug-1926  Transition of Care [facility]) CM/SW Contact  Najir Roop, Juliann Pulse, RN Phone Number: 01/09/2021, 10:34 AM  Clinical Narrative:Additional bed offers provided to son James-await choice prior starting auth.     1. 0.9 mi [place] [facility] and [place], [place] [address] [phone] Overall rating Much above average 2. 1.3 mi [place] at [place], [place] [address] [phone] Overall rating Much below average 3. 2.3 mi [place], [place] [address] [phone] Overall rating Much below average 4. 2.7 mi [place] [address] [place], [place] [address] [phone] Overall rating Below average 5. 3 mi [facility] [place], [place] [address] [phone] Overall rating Below average 6. 3.1 [place], [place] [address] [phone] Overall rating Below average 7. 3.4 mi [facility] at the [place], [place] [address] [phone] Overall rating Below average 8. 3.4 [place], [place] [address] [phone] Overall rating Above average 9. [place] [address] [place], [place] [address] [phone] Overall rating Much below average 10. 4.1 mi [facility] at [place], [place] [address] [phone] Overall rating Much above average 11. 4.2 [place], [place] [address] [phone] Overall rating Much above average 12. 4.2 mi [facility] [place], [place] [address] [phone] Overall rating Below average 13. [place], [place] [address] [phone] Overall rating Above average 14. 8.2 [place], [place] [address] [phone] Overall rating Above average 15. 8.4 mi [facility] [address], [place] [address] [phone] Overall rating Average 16. 8.7 mi [facility] [place], [place] [address] [phone] Overall rating Much above average 17. 9.6 mi [facility] and [place], [place] [address] [phone] Overall rating Average 18. 10.3 mi [place] [facility] [address] [place], [place] [address] [phone] Overall rating Much above average 19. 12.1 mi [facility] [address] [place], [place] [address] [phone] Overall rating Much below average 20. 12.2 [facility] [address] [place], [place] [address] [phone] Overall rating Much below average 21. 13.7 mi The [place] [address] [place], [place] [address] [phone] Overall rating Much below average 22. 13.8 mi [facility] [place], [place] [address] [phone] Overall rating Below average 23. 14.4 mi [place] [facility] [place], [place] [address] [phone] Overall rating Below average 24. 14.5 [place] [address] [place], [place] [address] [phone] Overall rating Much below average 25. 16.5 mi [place] [address] [place],  [address] [phone] Overall rating Above average 26. 17.2 mi [facility] [place],  [address] [phone] Overall rating Much above average  28. 25.6 [facility] [address] [place], [place] [address] [phone] Overall rating Much below average 28. 18.4 mi [place], [place] [address] [phone] Overall rating Average 29. 19.2 mi [facility] and [facility] [address] [place], [place] [address] [phone] Overall rating Much below average 30. 20.5 mi [address] at the [facility], [place] [address] [phone] Overall rating Much above average 31. 21.2 mi [address] [place], [place] [address] [phone] Overall rating Below average 32. 21.2 [place], [place] [address] [phone] Overall rating Average 33. 21.4 [address] [place], [place] [address] [phone] Overall rating Much above average 34. [place] [address] [place], [place] [address] [phone] Overall rating Below average 35. 22.2 [place], [place] [address] [phone] Overall rating Much above average 36. 22.4 mi [facility] [place], [place] [address] [phone] Overall rating Much below average 37. 23.2 mi [facility] [address] [place], [place] [address] [phone] Overall rating Below average 38. 23.8 mi [facility] [address] [place], [place] [address] [phone] Overall rating Above average 39. 23.9 [facility] [place], [place] [address] [phone] Overall rating Much below average 40. [place], [place] [address] [phone] Overall rating Much below average 41. [place] [address] [place], [place] [address] [phone] Overall rating Average 42. 24.3 [facility] [address] [place], [place] [address] [phone] Overall rating Much below average 43. 24.3 mi [facility] [place], [place] [address] [phone] Overall rating Below average 44. 24.8 mi [place] [address] [place], [place] [address] [phone] Overall rating Above average 45. 24.9 mi The [place] at [place],  [address] [phone] Overall rating Much below average To explore and download nursing home data,visit the data ca  Expected Discharge Plan: [place] Barriers to Discharge: Continued Medical Work up  Expected Discharge Plan and Services Expected Discharge Plan: [place]   Discharge Planning Services: CM Consult   Living arrangements for the past 2 months: Single Family Home                                       Social Determinants of Health (SDOH) Interventions    Readmission Risk Interventions No flowsheet data found.

## 2021-01-09 NOTE — Progress Notes (Addendum)
Progress Note  Patient Name: Casey Gates. Date of Encounter: 01/09/2021  Primary Cardiologist: Larae Grooms, MD   Subjective   Rates more controlled today in the 80's. No specific complaints. Son at bedside with concern for upcoming d/c.   Inpatient Medications    Scheduled Meds: . amoxicillin-clavulanate  1 tablet Oral Q12H  . apixaban  2.5 mg Oral BID  . artificial tears  1 application Left Eye TID  . budesonide (PULMICORT) nebulizer solution  0.5 mg Nebulization BID  . chlorhexidine  15 mL Mouth Rinse BID  . Chlorhexidine Gluconate Cloth  6 each Topical Daily  . digoxin  0.0625 mg Oral Daily  . feeding supplement  237 mL Oral BID BM  . mouth rinse  15 mL Mouth Rinse q12n4p  . metoprolol tartrate  12.5 mg Oral BID   Continuous Infusions: . sodium chloride Stopped (01/02/21 1136)   PRN Meds: fentaNYL (SUBLIMAZE) injection, ipratropium-albuterol, lidocaine (PF), lip balm, loperamide, metoprolol tartrate, ondansetron **OR** ondansetron (ZOFRAN) IV, oxyCODONE, Resource ThickenUp Clear   Vital Signs    Vitals:   01/08/21 1302 01/08/21 2031 01/09/21 0441 01/09/21 0949  BP: (!) 104/53 106/72 126/68 125/64  Pulse: (!) 105 88 92 88  Resp: 18 20 18    Temp: 98.1 F (36.7 C) 97.7 F (36.5 C) 98.7 F (37.1 C)   TempSrc: Oral Oral Oral   SpO2: 95% 95% 93%   Weight:      Height:        Intake/Output Summary (Last 24 hours) at 01/09/2021 1051 Last data filed at 01/09/2021 0900 Gross per 24 hour  Intake 240 ml  Output 1325 ml  Net -1085 ml   Filed Weights   12/29/20 0615 12/31/20 0630 01/06/21 2140  Weight: 65.4 kg 65.1 kg 68.7 kg   Physical Exam   General: Elderly, NAD Neck: Negative for carotid bruits. No JVD Lungs: Diminished. Breathing is unlabored. Cardiovascular: Irregularly irregular. No murmurs Abdomen: Soft, non-tender, non-distended. No obvious abdominal masses. Extremities: No edema. Radial pulses 2+ bilaterally Neuro: Alert and oriented. No  focal deficits. No facial asymmetry. MAE spontaneously. Psych: Responds to questions appropriately with normal affect.    Labs    Chemistry Recent Labs  Lab 01/07/21 0604 01/08/21 0448 01/09/21 0502  NA 142 139 137  K 4.9 4.3 3.9  CL 107 101 99  CO2 27 28 31   GLUCOSE 76 79 84  BUN 21 19 18   CREATININE 0.89 0.74 0.69  CALCIUM 7.8* 7.7* 7.5*  ALBUMIN 1.7* 1.8*  --   GFRNONAA >60 >60 >60  ANIONGAP 8 10 7      Hematology Recent Labs  Lab 01/05/21 0246 01/06/21 0241 01/07/21 0604 01/08/21 0448 01/09/21 0502  WBC 9.0 12.7* 12.5*  --   --   RBC 4.21* 4.09* 4.59  --   --   HGB 11.6* 11.4* 12.8* 12.0* 11.6*  HCT 36.7* 36.4* 40.8 39.0 36.7*  MCV 87.2 89.0 88.9  --   --   MCH 27.6 27.9 27.9  --   --   MCHC 31.6 31.3 31.4  --   --   RDW 14.7 14.9 15.5  --   --   PLT 374 364 297  --   --     Cardiac EnzymesNo results for input(s): TROPONINI in the last 168 hours. No results for input(s): TROPIPOC in the last 168 hours.   BNPNo results for input(s): BNP, PROBNP in the last 168 hours.   DDimer No results for  input(s): DDIMER in the last 168 hours.   Radiology    No results found.  Telemetry    AF with rates in the 80's- Personally Reviewed  ECG    No new tracing as of 01/09/21- Personally Reviewed  Cardiac Studies   Echo 12/30/20: 1. Left ventricular ejection fraction, by estimation, is 40 to 45%.  Difficult to determine systolic function as poor image quality and patient  is in Afib with RVR, but appears left ventricle has mildly decreased  function. Would consider repeat limited  echo with contrast once heart rate better controlled. Left ventricular  endocardial border not optimally defined to evaluate regional wall motion.  There is mild left ventricular hypertrophy. Left ventricular diastolic  parameters are indeterminate.  2. Right ventricular systolic function is mildly reduced. The right  ventricular size is mildly enlarged. There is normal pulmonary  artery  systolic pressure. The estimated right ventricular systolic pressure is  62.3 mmHg.  3. Left atrial size was moderately dilated.  4. The mitral valve is degenerative. No evidence of mitral valve  regurgitation. Moderate mitral annular calcification.  5. The aortic valve was not well visualized. Aortic valve regurgitation  is trivial. No aortic stenosis is present.  6. Aortic dilatation noted. There is mild dilatation of the aortic root,  measuring 42 mm. There is mild dilatation of the ascending aorta,  measuring 41 mm.  7. The inferior vena cava is dilated in size with <50% respiratory  variability, suggesting right atrial pressure of 15 mmHg.   Patient Profile     87 y.o. 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 Hospitalization has been complicated by atrial fibrillation with RVR and hypotension.   Assessment & Plan    1. Acute systolic and diastolic heart failure -Echocardiogram this admission with LVEF at 40-45%>>>down from 65-70% on previous study. Had mixed cardiogenic/septic shock on admission which is resolving. No longer on pressors  -Continue low dose metoprolol, will increase to 25 mg BID -Can possibly add ACEI/ARB in the OP setting if BP allows  -BP stable at 125/64   2. Persistent atrial fibrillation: -Rates slightly improved>>>80s to 110s, will increase metoprolol to 25 mg BID -Preciously treated with amiodarone however this was discontinued due to persistent AF -Currently on digoxin and low dose -Continue Eliquis for AC  3. Recent COVID-19 infection: -SpO2 stable in the mid-90's on RA   4. Hospital acquired PNA with right sided loculated pleural effusion: -Management per IM  -Planning 14-day course of abx    Signed, Kathyrn Drown NP-C [facility] Pager: [phone] 01/09/2021, 10:51 AM     For questions or updates, please contact   Please consult [email or web address] for contact info under Cardiology/STEMI.   Patient seen and examined.  Agree with above documentation.  On exam, patient is alert, normal rate and irregular rhythm, no murmurs, lungs CTAB, no LE edema.  Telemetry shows AF with rates 80-110s.  Rates improved but intermittently mildly elevated, will titrate up metoprolol to 25 mg BID.  Donato Heinz, MD

## 2021-01-09 NOTE — Progress Notes (Signed)
Nutrition Follow-up  DOCUMENTATION CODES:   Not applicable  INTERVENTION:  - continue Ensure Enlive BID.  NUTRITION DIAGNOSIS:   Increased nutrient needs related to acute illness as evidenced by estimated needs. -ongoing  GOAL:   Patient will meet greater than or equal to 90% of their needs -minimally met on average over the past 1 week  MONITOR:   PO intake,Supplement acceptance,Labs,Weight trends  ASSESSMENT:   87 year old male with medical history of afib on anticoagulation, OSA, and chronic pain syndrome. He presented to the ED due to AMS. CXR in the ED showed RLL opacity. CT chest showed patchy peribronchovascular opacity suspicious for PNA, small to moderate-sized partly loculated R pleural effusion, and significant atelectasis of the RLL, with a smaller area of atelectasis/scarring of the R middle lobe.  He has been accepting Ensure Enlive 100% of the time offered over the past 1 week. The only documented intakes over the past week were 100% of dinner on 2/1 (803 kcal and 24 grams protein); 50% of lunch on 2/6 (303 kcal and 19 grams protein); 80% of breakfast this AM (670 kcal and 24 grams protein).  Patient was last weighed on 2/5 at which time weight was +24 lb compared to weight on 1/18. Moderate pitting edema to BLE documented in the edema section of flow sheet.   MD note from yesterday indicated anticipated d/c date of 2/9 to SNF.     Labs reviewed; CBGs: 80 and 75 mg/dl, Ca: 7.5 mg/dl.  Medications reviewed.   Diet Order:   Diet Order            DIET DYS 3 Room service appropriate? Yes; Fluid consistency: Thin  Diet effective now                 EDUCATION NEEDS:   Education needs have been addressed  Skin:  Skin Assessment: Skin Integrity Issues: Skin Integrity Issues:: Other (Comment) Other: MASD coccyx  Last BM:  2/7 (type 5 x1)  Height:   Ht Readings from Last 1 Encounters:  12/17/20 $RemoveB'5\' 11"'bCpzatce$  (1.803 m)    Weight:   Wt Readings from Last 1  Encounters:  01/06/21 68.7 kg    Estimated Nutritional Needs:  Kcal:  1500-1700 kcal Protein:  70-80 grams Fluid:  >/= 1.8 L/day      Jarome Matin, MS, RD, LDN, CNSC Inpatient Clinical Dietitian RD pager # available in AMION  After hours/weekend pager # available in [facility]

## 2021-01-10 DIAGNOSIS — I5043 Acute on chronic combined systolic (congestive) and diastolic (congestive) heart failure: Secondary | ICD-10-CM | POA: Diagnosis not present

## 2021-01-10 DIAGNOSIS — E119 Type 2 diabetes mellitus without complications: Secondary | ICD-10-CM | POA: Diagnosis not present

## 2021-01-10 DIAGNOSIS — E46 Unspecified protein-calorie malnutrition: Secondary | ICD-10-CM | POA: Diagnosis not present

## 2021-01-10 DIAGNOSIS — A419 Sepsis, unspecified organism: Secondary | ICD-10-CM | POA: Diagnosis not present

## 2021-01-10 DIAGNOSIS — J189 Pneumonia, unspecified organism: Secondary | ICD-10-CM | POA: Diagnosis not present

## 2021-01-10 DIAGNOSIS — L89133 Pressure ulcer of right lower back, stage 3: Secondary | ICD-10-CM | POA: Diagnosis not present

## 2021-01-10 DIAGNOSIS — J9601 Acute respiratory failure with hypoxia: Secondary | ICD-10-CM | POA: Diagnosis not present

## 2021-01-10 DIAGNOSIS — D649 Anemia, unspecified: Secondary | ICD-10-CM | POA: Diagnosis not present

## 2021-01-10 DIAGNOSIS — G894 Chronic pain syndrome: Secondary | ICD-10-CM | POA: Diagnosis not present

## 2021-01-10 DIAGNOSIS — I10 Essential (primary) hypertension: Secondary | ICD-10-CM | POA: Diagnosis not present

## 2021-01-10 DIAGNOSIS — M2550 Pain in unspecified joint: Secondary | ICD-10-CM | POA: Diagnosis not present

## 2021-01-10 DIAGNOSIS — I480 Paroxysmal atrial fibrillation: Secondary | ICD-10-CM | POA: Diagnosis not present

## 2021-01-10 DIAGNOSIS — J90 Pleural effusion, not elsewhere classified: Secondary | ICD-10-CM | POA: Diagnosis not present

## 2021-01-10 DIAGNOSIS — E440 Moderate protein-calorie malnutrition: Secondary | ICD-10-CM | POA: Diagnosis not present

## 2021-01-10 DIAGNOSIS — G9341 Metabolic encephalopathy: Secondary | ICD-10-CM | POA: Diagnosis not present

## 2021-01-10 DIAGNOSIS — R531 Weakness: Secondary | ICD-10-CM | POA: Diagnosis not present

## 2021-01-10 DIAGNOSIS — Z7401 Bed confinement status: Secondary | ICD-10-CM | POA: Diagnosis not present

## 2021-01-10 DIAGNOSIS — G9340 Encephalopathy, unspecified: Secondary | ICD-10-CM | POA: Diagnosis not present

## 2021-01-10 DIAGNOSIS — G8929 Other chronic pain: Secondary | ICD-10-CM | POA: Diagnosis not present

## 2021-01-10 DIAGNOSIS — R339 Retention of urine, unspecified: Secondary | ICD-10-CM | POA: Diagnosis not present

## 2021-01-10 DIAGNOSIS — R52 Pain, unspecified: Secondary | ICD-10-CM | POA: Diagnosis not present

## 2021-01-10 DIAGNOSIS — I77810 Thoracic aortic ectasia: Secondary | ICD-10-CM | POA: Diagnosis not present

## 2021-01-10 DIAGNOSIS — J869 Pyothorax without fistula: Secondary | ICD-10-CM | POA: Diagnosis not present

## 2021-01-10 DIAGNOSIS — I4891 Unspecified atrial fibrillation: Secondary | ICD-10-CM | POA: Diagnosis not present

## 2021-01-10 DIAGNOSIS — R0902 Hypoxemia: Secondary | ICD-10-CM | POA: Diagnosis not present

## 2021-01-10 DIAGNOSIS — E0789 Other specified disorders of thyroid: Secondary | ICD-10-CM | POA: Diagnosis not present

## 2021-01-10 DIAGNOSIS — R338 Other retention of urine: Secondary | ICD-10-CM | POA: Diagnosis not present

## 2021-01-10 DIAGNOSIS — C61 Malignant neoplasm of prostate: Secondary | ICD-10-CM | POA: Diagnosis not present

## 2021-01-10 DIAGNOSIS — Z8546 Personal history of malignant neoplasm of prostate: Secondary | ICD-10-CM | POA: Diagnosis not present

## 2021-01-10 DIAGNOSIS — R918 Other nonspecific abnormal finding of lung field: Secondary | ICD-10-CM | POA: Diagnosis not present

## 2021-01-10 DIAGNOSIS — E039 Hypothyroidism, unspecified: Secondary | ICD-10-CM | POA: Diagnosis not present

## 2021-01-10 LAB — RENAL FUNCTION PANEL
Albumin: 1.8 g/dL — ABNORMAL LOW (ref 3.5–5.0)
Anion gap: 7 (ref 5–15)
BUN: 13 mg/dL (ref 8–23)
CO2: 31 mmol/L (ref 22–32)
Calcium: 7.6 mg/dL — ABNORMAL LOW (ref 8.9–10.3)
Chloride: 100 mmol/L (ref 98–111)
Creatinine, Ser: 0.59 mg/dL — ABNORMAL LOW (ref 0.61–1.24)
GFR, Estimated: >60 mL/min (ref >60)
Glucose, Bld: 90 mg/dL (ref 70–99)
Phosphorus: 2.6 mg/dL (ref 2.5–4.6)
Potassium: 4.2 mmol/L (ref 3.5–5.1)
Sodium: 138 mmol/L (ref 135–145)

## 2021-01-10 LAB — GLUCOSE, CAPILLARY
Glucose-Capillary: 116 mg/dL — ABNORMAL HIGH (ref 70–99)
Glucose-Capillary: 132 mg/dL — ABNORMAL HIGH (ref 70–99)
Glucose-Capillary: 77 mg/dL (ref 70–99)
Glucose-Capillary: 79 mg/dL (ref 70–99)
Glucose-Capillary: 86 mg/dL (ref 70–99)

## 2021-01-10 LAB — HEMOGLOBIN AND HEMATOCRIT, BLOOD
HCT: 36.2 % — ABNORMAL LOW (ref 39.0–52.0)
Hemoglobin: 11.3 g/dL — ABNORMAL LOW (ref 13.0–17.0)

## 2021-01-10 LAB — MAGNESIUM: Magnesium: 2 mg/dL (ref 1.7–2.4)

## 2021-01-10 MED ORDER — AMOXICILLIN-POT CLAVULANATE 875-125 MG PO TABS: 1.0000 | ORAL_TABLET | Freq: Two times a day (BID) | ORAL | Status: AC

## 2021-01-10 MED ORDER — RESOURCE THICKENUP CLEAR PO POWD: ORAL | Status: AC

## 2021-01-10 MED ORDER — METOPROLOL TARTRATE 25 MG PO TABS
37.5000 mg | ORAL_TABLET | Freq: Two times a day (BID) | ORAL | Status: DC
  Administered 2021-01-10: 37.5 mg via ORAL
  Filled 2021-01-10: qty 2

## 2021-01-10 MED ORDER — DIGOXIN 62.5 MCG PO TABS: 0.0625 mg | ORAL_TABLET | Freq: Every day | ORAL | Status: AC

## 2021-01-10 MED ORDER — LOPERAMIDE HCL 2 MG PO CAPS: 2.0000 mg | ORAL_CAPSULE | Freq: Every day | ORAL | Status: AC | PRN

## 2021-01-10 MED ORDER — METOPROLOL TARTRATE 37.5 MG PO TABS: 37.5000 mg | ORAL_TABLET | Freq: Two times a day (BID) | ORAL | Status: AC

## 2021-01-10 MED ORDER — ENSURE ENLIVE PO LIQD: 237.0000 mL | Freq: Two times a day (BID) | ORAL | Status: AC

## 2021-01-10 MED ORDER — OXYCODONE HCL 5 MG PO TABS: 5.0000 mg | ORAL_TABLET | Freq: Two times a day (BID) | ORAL | 0 refills | Status: AC | PRN

## 2021-01-10 NOTE — Discharge Summary (Signed)
Physician Discharge Summary  [name]. ZWC:[id number] DOB: [birth year]  PCP: Velna Hatchet, MD  Admitted from: ALF Discharged to: SNF  Admit date: 12/17/2020 Discharge date: 01/10/2021  Recommendations for Outpatient Follow-up:    Contact information for follow-up providers    Noemi Chapel P, DO. Schedule an appointment as soon as possible for a visit in 1 week(s).   Specialty: Pulmonary Disease Contact information: [address] [place] [address] [phone]        [name], MD Follow up on 02/12/2021.   Specialties: Cardiology, Radiology, Interventional Cardiology Why: at 1:40pm Contact information: [address] [place] [address] [phone]        Franchot Gallo, MD. Schedule an appointment as soon as possible for a visit in 2 week(s).   Specialty: Urology Why: For evaluation and management of urinary retention that required a coud catheter placement during current hospitalization. Contact information: [place] [address] [phone]        MD at SNF. Schedule an appointment as soon as possible for a visit.   Why: To be seen in 2 to 3 days with repeat CBC & CMP.  Recommend repeating serum digoxin level in 1 week.         Velna Hatchet, MD. Schedule an appointment as soon as possible for a visit.   Specialty: Internal Medicine Why: Upon discharge from SNF. Contact information: [address] [place] [address] [phone]            Contact information for after-discharge care    Destination    [facility] .   Service: Skilled Nursing Contact information: [place]: N/A    Equipment/Devices: TBD at SNF    Discharge Condition: Improved and stable.   Code Status: DNR Diet recommendation:  Discharge Diet Orders (From admission,  onward)    Start     Ordered   01/10/21 0000  Diet - low sodium heart healthy       Comments: Diet consistency: Dysphagia 3 diet and thin liquids.   01/10/21 1234           Discharge Diagnoses:  Principal Problem:   Acute respiratory failure with hypoxia ([place]) Active Problems:   Chronic pain   Urinary retention   Multifocal pneumonia   Septic shock (HCC)   Prostate cancer (HCC)   Atrial fibrillation with RVR (HCC)   Acute metabolic encephalopathy   Hypernatremia   Empyema (HCC)   DNR (do not resuscitate)   Goals of care, counseling/discussion   Shock circulatory ([place])   Large pleural effusion   Acute on chronic combined systolic and diastolic CHF (congestive heart failure) ([place])   Brief Summary: Patient 87 year old gentleman initially admitted 12/17/2020 with pneumonia and associated right-sided loculated pleural effusion.  Patient completed full course of antibiotics however continued to have worsening loculated right pleural effusion with evolution of air/gas fluid level.  Right chest tube placed and patient completed intrapleural fibrinolytic therapy. Patient subsequently noted to go into septic shock with concerns for mild adrenal insufficiency and transferred to the ICU and subsequently placed on pressors as well as stress dose steroids.  Patient had a subsequent second chest tube placed by IR in the right lateral superior loculated pleural fluid.  Patient also on midodrine.  Patient also  noted to go into A. fib with RVR cardiology consulted patient initially on amiodarone due to shock and subsequently transitioned to digoxin.  Cardiology consulted. Pressors weaned off.  Patient subsequently transferred to [place] service.  PCCM followed for chest tube management.   Assessment & Plan:   1 acute hypoxemic respiratory failure secondary to pneumonia with empyema/OSA/right-sided pneumothorax Patient with clinical improvement.  Currently on room air.  Status post  chest tube placement x2.  Apical chest tube discontinued 01/04/2021.  Right chest tube discontinued 01/05/2021.  CT chest done concerning for increased right hydropneumothorax, stable moderate left pleural effusion, stable right middle lobe and lower lobe opacities, interval development of subcutaneous emphysema involving right posterior chest wall.  Chest x-ray  with persistent 5 to 10% right-sided pneumothorax which is stable.  Patient denies shortness of breath.  Continue pulmonary hygiene, mobilization.  Continue Augmentin, duration as noted below.  Outpatient follow-up with pulmonary-appointment in [place].    2.  Right empyema secondary to PNA Concern that patient's right empyema may be in the setting of aspiration. Status post chest tube placement per IR 12/26/2020 with completion of fibrinolytics.  Patient with apical chest tube placed to 12/21/2020 for residual loculated empyema.  Apical chest tube discontinued 01/04/2021.  Right chest tube discontinued 01/05/2021 per PCCM.  CT chest (01/04/2021) with stable position right-sided pigtail drainage catheter seen in right hemithorax, significantly increased amount of air noted in the upper lung zone consistent with increased right hydropneumothorax, stable right small pleural effusion.  Stable moderate left pleural effusion noted with associated atelectasis or infiltrate in the left lower lobe, stable right middle lobe and lower lobe opacities noted concerning for atelectasis or infiltrates, interval development of subcutaneous emphysema involving right posterior chest wall.  Repeat chest x-ray with persistent 5 to 10% right-sided pneumothorax which has been stable. IV antibiotics have been transitioned to oral Augmentin with PCCM recommending 14-day course of antibiotics, through 01/16/2021.  PCCM recommending repeat CT chest on 01/16/2021 and if still residual disease/persistent signs of infection, if unresolved infiltrates will need an extra week of Augmentin. Outpatient  follow-up with pulmonary.  3.  Septic shock/adrenal insufficiency Patient was transferred to the ICU 12/28/2018.  Has noted to be in septic shock requiring pressors.  Patient also placed on stress dose steroids.  Cortisol level noted at 16.1.  Patient has been weaned off midodrine and stress dose steroids.  Shock has resolved.  4.  Persistent atrial fibrillation Patient initially was on amiodarone for management of A. fib as patient also noted to be in septic shock.  Amiodarone discontinued and patient placed on digoxin for better rate control.  Heart rate improved.  Blood pressure improved on midodrine and IV Solu-Cortef. Diltiazem discontinued this admission.  Midodrine and stress dose steroids have been discontinued.  Was on IV heparin and transitioned to Eliquis 01/04/2021.  Electrolytes repleted.  Keep magnesium greater than 2.  Keep potassium greater than 4.    Communicated with Cardiology who have uptitrated dose of metoprolol and have cleared patient for discharge.  They recommend continuing digoxin, metoprolol and Eliquis at time of discharge.  They recommend repeating serum digoxin level within a week and have arranged outpatient follow-up.  5.  Acute systolic and diastolic heart failure/mixed cardiogenic shock 2D echo done with EF of 40 to 45% down from 65 to 70%.  Patient required pressors early on during the hospitalization which have subsequently been weaned off.  Currently off midodrine and stress dose steroids.  On digoxin and Lopressor.  Per cardiology.  Clinically euvolemic.  6.  Acute metabolic encephalopathy Secondary to sepsis.  Resolved.  Currently at baseline.  Follow.  7.  Hypophosphatemia/hypokalemia Replaced.  8.  Chronic pain Stable.  Patient noted to be on Opana and Neurontin as needed prior to admission which were discontinued during this hospitalization.  Currently pain controlled on infrequent doses of oxycodone IR 5 mg.  Minimize opioid use given advanced age and  multiple risks including mental status changes, fall risk and constipation.  9.  Moderate protein calorie malnutrition Dysphagia 3 diet.  Follow.  10.  History of prostate cancer/urinary retention Foley catheter replaced on 01/03/2021.  Patient underwent voiding trial which he failed on 01/07/2021, and Foley catheter placed back pain per urology.  Outpatient follow-up with urology.    11.  Anemia Patient with no overt bleeding.  Hemoglobin stable in the 11 g range.  Transfusion threshold hemoglobin < 7.   4.3 cm ascending thoracic aortic aneurysm: Noted on CT chest.  Outpatient follow-up as deemed necessary.  Right upper extremity swelling Suspect IV infiltration.  No features suggestive of infection.  Low index of suspicion for DVT.  Recommend elevating right upper extremity and monitoring closely.     Consultants:   PCCM: Dr. Chase Caller 12/25/2020  Cardiology: Dr. Johney Frame 12/19/2020  Urology: Dr. Everlena Cooper 01/07/2021 for difficult Foley catheter placement.  Significant Hospital Events:  1/16 Admit, started on broad spectrum abx 1/17 Thoracentesis with exudative pleural effusion, 2106ml hazy amber fluid removed 1/18 AFwRVR, Cardiology consulted 1/19 Completed azithromycin  1/23 Completed ceftriaxone  1/24 PCCM consulted  1/26 R chest tube with intraplural tPA / dornase instilled  1/28 Completed 3rd dose intrapleural fibrinolytic therapy  1/29 [place] O2, afebrile with normal mental status. Labile rhythm / BP on low dose neosynephrine  1/30 [place] O2, cortisol 16.1, on stress dose steroids on neo. On dig/amio for AF.  2/01 Anterior chest tube placed per IR into R lateral superior loculated pleural fluid 2/03 Apical pocket of fluid cleared on CXR, apical chest tube discontinued 2/04 persistent 5- 10% right-sided pneumothorax 2/4/right chest tube removed     Procedures:   CT Chest 1/24 >>moderate to large loculated right pleural effusion is increased in volume when compared to  1/16. New focus of gas with air-fluid level in the posterior compartment of the effusion. Progressive atelectasis of the entire right lower lobe with partial atelectasis of the right middle lobe and right upper lobe. Progressive consolidation involving the posterior medial left lower lobe containing air bronchograms. CT Chest w/o 2/3 >>1. Stable position of right-sided pigtail drainage catheter seen in right hemithorax. Significantly increased amount of air is noted in the upper lung zone consistent with increased right hydropneumothorax. Stable small right pleural effusion is noted. 2. Stable moderate left pleural effusion is noted with associated atelectasis or infiltrate of the left lower lobe. 3. Stable right middle lobe and lower lobe opacities are noted concerning for atelectasis or infiltrates. 4. Interval development of subcutaneous emphysema involving the right posterior chest wall. 5. Coronary artery calcifications are noted. 6. 4.3 cm ascending thoracic aortic aneurysm is noted.  7. Aortic atherosclerosis.  R lateral chest tube 1/25 per IR>>>  Right anterior chest tube 2/1 >>>    Antimicrobials:  Azithro 1/16 >>1/20  Ceftriaxone 1/16 >>1/23  Cefepime 1/24 >>1/27  Vanco 1/24 >>1/31 Meropenem 1/29 >> 2/4 Zyvox 1/31>> 2/4 Augmentin 01/05/2021>>> 01/16/2021  Micro Data:  COVID 1/16 >>negative  BCx2 1/16 >>negative  Pleural fluid 1/17 >>negative  MRSA PCR 1/18 >>  positive  Chest tube aspirate 1/25 >>negative  BCx2 1/27 >>negative UC 1/27 >>negative     Discharge Instructions  Discharge Instructions    (HEART FAILURE PATIENTS) Call MD:  Anytime you have any of the following symptoms: 1) 3 pound weight gain in 24 hours or 5 pounds in 1 week 2) shortness of breath, with or without a dry hacking cough 3) swelling in the hands, feet or stomach 4) if you have to sleep on extra pillows at night in order to breathe.   Complete by: As directed    Call MD  for:   Complete by: As directed    Altered mental status.   Call MD for:  difficulty breathing, headache or visual disturbances   Complete by: As directed    Call MD for:  extreme fatigue   Complete by: As directed    Call MD for:  persistant dizziness or light-headedness   Complete by: As directed    Call MD for:  persistant nausea and vomiting   Complete by: As directed    Call MD for:  redness, tenderness, or signs of infection (pain, swelling, redness, odor or green/yellow discharge around incision site)   Complete by: As directed    Call MD for:  severe uncontrolled pain   Complete by: As directed    Call MD for:  temperature >100.4   Complete by: As directed    Diet - low sodium heart healthy   Complete by: As directed    Diet consistency: Dysphagia 3 diet and thin liquids.   Increase activity slowly   Complete by: As directed    No wound care   Complete by: As directed        Medication List    STOP taking these medications   diltiazem 120 MG 12 hr capsule Commonly known as: CARDIZEM SR   gabapentin 300 MG capsule Commonly known as: NEURONTIN   mirtazapine 30 MG tablet Commonly known as: REMERON   Movantik 25 MG Tabs tablet Generic drug: naloxegol oxalate   oxymorphone 10 MG tablet Commonly known as: OPANA     TAKE these medications   amoxicillin-clavulanate 875-125 MG tablet Commonly known as: AUGMENTIN Take 1 tablet by mouth every 12 (twelve) hours. 14-day course of antibiotics, through 01/16/2021.  PCCM recommending repeat CT chest on 01/16/2021 and if still residual disease/persistent signs of infection, if unresolved infiltrates will need an extra week of Augmentin.   apixaban 2.5 MG Tabs tablet Commonly known as: Eliquis Take 1 tablet (2.5 mg total) by mouth 2 (two) times daily.   artificial tears ointment Place 1 application into the left eye 3 (three) times daily. Left lower eye lid   Digoxin 62.5 MCG Tabs Take 0.0625 mg by mouth daily. Start  taking on: January 11, 2021   feeding supplement Liqd Take 237 mLs by mouth 2 (two) times daily between meals.   loperamide 2 MG capsule Commonly known as: IMODIUM Take 1 capsule (2 mg total) by mouth daily as needed for diarrhea or loose stools.   Metoprolol Tartrate 37.5 MG Tabs Take 37.5 mg by mouth 2 (two) times daily.   oxyCODONE 5 MG immediate release tablet Commonly known as: Oxy IR/ROXICODONE Take 1 tablet (5 mg total) by mouth every 12 (twelve) hours as needed for moderate pain or severe pain.   Resource ThickenUp Clear Powd Oral, As needed, other      Allergies  Allergen Reactions  . Coconut Flavor [Flavoring Agent] Hypertension    Anything  that is related to coconut---LOSS OF CONSCIOUSNESS ([place])  . Coconut Oil Other (See Comments)    Anything that is related to coconut--- LOSS OF CONSCIOUSNESS (SYNCOPE)  . Doxycycline Hyclate Hives  . Food Anaphylaxis    ALLERGIC TO ALL NUTS WITH THE EXCEPTION OF CASHEWS, ALMONDS  . Black [employer]  Other (See Comments)    Passes out ( can only eat almonds or peanuts)  . Coconut Oil Other (See Comments)    Faints   . Doxycycline Rash      Procedures/Studies: DG Chest 1 View  Result Date: 12/18/2020 CLINICAL DATA:  Post thesis EXAM: CHEST  1 VIEW COMPARISON:  Portable exam 1356 hours compared to 12/17/2020 FINDINGS: Upper normal size of cardiac silhouette. Mediastinal contours and pulmonary vascularity normal. Atherosclerotic calcification aorta. Partially loculated RIGHT pleural effusion again identified. No pneumothorax following thoracentesis. Persistent atelectasis versus infiltrate at medial LEFT lower lobe. Bones demineralized. IMPRESSION: No pneumothorax following thoracentesis. Persistent partially loculated RIGHT pleural effusion and LEFT basilar atelectasis versus infiltrate. Electronically Signed   By: Lavonia Dana M.D.   On: 12/18/2020 14:06   CT HEAD WO CONTRAST  Result Date: 12/17/2020 CLINICAL DATA:  UTI  with AMS Patient had 75 cc of isvoue 300 at 1545 EXAM: CT HEAD WITHOUT CONTRAST TECHNIQUE: Contiguous axial images were obtained from the base of the skull through the vertex without intravenous contrast. COMPARISON:  09/01/2020 a lesion FINDINGS: Brain: No evidence of acute infarction, hemorrhage, hydrocephalus, extra-axial collection or mass lesion/mass effect. There is ventricular sulcal enlargement reflecting diffuse atrophy stable from the prior study. Mild patchy areas of white matter hypoattenuation are also present and stable consistent with chronic microvascular ischemic change. Vascular: No hyperdense vessel or unexpected calcification. Skull: Normal. Negative for fracture or focal lesion. Sinuses/Orbits: Globes and orbits are unremarkable. Visualized sinuses are essentially clear. Other: None. IMPRESSION: 1. No acute intracranial abnormalities. 2. Atrophy and mild chronic microvascular ischemic change stable from prior study. Electronically Signed   By: Lajean Manes M.D.   On: 12/17/2020 18:27   CT CHEST WO CONTRAST  Result Date: 01/04/2021 CLINICAL DATA:  Right pleural effusion. EXAM: CT CHEST WITHOUT CONTRAST TECHNIQUE: Multidetector CT imaging of the chest was performed following the standard protocol without IV contrast. COMPARISON:  January 01, 2021. FINDINGS: Cardiovascular: 4.3 cm ascending thoracic aortic aneurysm is noted. Atherosclerosis of thoracic aorta is noted. Coronary artery calcifications are noted. Normal cardiac size. No pericardial effusion. Mediastinum/Nodes: No enlarged mediastinal or axillary lymph nodes. Thyroid gland, trachea, and esophagus demonstrate no significant findings. Lungs/Pleura: Stable position of right-sided pigtail drainage catheter seen in right hemithorax. Stable small right pleural effusion is noted. Significantly increased amount of air is noted in upper lung zone consistent with increased right hydropneumothorax. Stable moderate left pleural effusion is  noted with associated atelectasis or infiltrate of the left lower lobe. Stable right middle lobe and lower lobe opacities are noted concerning for atelectasis or infiltrates. Upper Abdomen: No acute abnormality. Musculoskeletal: No significant osseous abnormality is noted. There is interval development of subcutaneous emphysema involving the right posterior chest wall. IMPRESSION: 1. Stable position of right-sided pigtail drainage catheter seen in right hemithorax. Significantly increased amount of air is noted in the upper lung zone consistent with increased right hydropneumothorax. Stable small right pleural effusion is noted. 2. Stable moderate left pleural effusion is noted with associated atelectasis or infiltrate of the left lower lobe. 3. Stable right middle lobe and lower lobe opacities are noted concerning for atelectasis or infiltrates. 4. Interval  development of subcutaneous emphysema involving the right posterior chest wall. 5. Coronary artery calcifications are noted. 6. 4.3 cm ascending thoracic aortic aneurysm is noted. 7. Aortic atherosclerosis. Aortic Atherosclerosis (ICD10-I70.0). Electronically Signed   By: Marijo Conception M.D.   On: 01/04/2021 14:45   CT CHEST WO CONTRAST  Result Date: 12/25/2020 CLINICAL DATA:  Evaluate for effusion or abscess. EXAM: CT CHEST WITHOUT CONTRAST TECHNIQUE: Multidetector CT imaging of the chest was performed following the standard protocol without IV contrast. COMPARISON:  12/17/2020 . FINDINGS: Cardiovascular: Cardiac enlargement, unchanged. Aortic atherosclerosis identified. Coronary artery atherosclerotic calcifications. No pericardial effusion. Mediastinum/Nodes: Thyroid gland is unremarkable. The trachea appears patent and is midline. Normal appearance of the esophagus. No enlarged axillary, supraclavicular, or mediastinal lymph nodes. Hilar lymph nodes are suboptimally evaluated due to lack of IV contrast material. Lungs/Pleura: Moderate to large loculated  right pleural effusion appears increased in volume when compared with 12/17/2020. Fluid is now extending over the right lung apex. Gas with air-fluid level is noted within the posterior component which is new from previous exam, image 106/2. There is atelectasis of the entire right lower lobe with partial atelectasis of the right middle lobe and right upper lobe. Progressive consolidation involving the posteromedial left lower lobe containing air bronchograms. Trace left pleural fluid. Patchy atelectasis is identified within the superior segment of left lower lobe. Several persistent patchy areas of ground-glass and airspace density noted within the anterior left upper lobe, similar to previous exam. Upper Abdomen: No acute abnormality. Previous cholecystectomy. Extensive aortic atherosclerosis with branch vessel disease. Trace amount of perihepatic ascites is new from previous exam. Musculoskeletal: Degenerative disc disease is identified within the thoracic and lumbar spine. Thoracolumbar scoliosis. IMPRESSION: 1. Moderate to large loculated right pleural effusion is increased in volume when compared with 12/17/2020. New focus of gas with air-fluid level is noted within the posterior component of the effusion. 2. Progressive atelectasis of the entire right lower lobe with partial atelectasis of the right middle lobe and right upper lobe. 3. Progressive consolidation involving the posteromedial left lower lobe containing air bronchograms. 4. Similar appearance of patchy areas of ground-glass and airspace density within the anterior left upper lobe. Likely post infectious or inflammatory in etiology. 5. Aortic atherosclerosis. Coronary artery calcifications. Aortic Atherosclerosis (ICD10-I70.0). Electronically Signed   By: Kerby Moors M.D.   On: 12/25/2020 11:18   CT CHEST W CONTRAST  Result Date: 01/01/2021 CLINICAL DATA:  Empyema. EXAM: CT CHEST WITH CONTRAST TECHNIQUE: Multidetector CT imaging of the chest  was performed during intravenous contrast administration. CONTRAST:  76mL OMNIPAQUE IOHEXOL 300 MG/ML  SOLN COMPARISON:  December 25, 2020. FINDINGS: Cardiovascular: Atherosclerosis of thoracic aorta is noted without dissection. 4.1 cm ascending thoracic aortic aneurysm is noted. Coronary artery calcifications are noted. Mild cardiomegaly is noted without pericardial effusion. Mediastinum/Nodes: No enlarged mediastinal, hilar, or axillary lymph nodes. Thyroid gland, trachea, and esophagus demonstrate no significant findings. Lungs/Pleura: Interval placement of pigtail chest tube into right pleural effusion noted on prior exam. The effusion is significantly smaller, with smaller loculated component seen in the upper lobe posteriorly. Small amount of air is noted within its posterior portion. Right middle and lower lobe opacities are noted concerning for pneumonia. Mild left pleural effusion is noted with associated atelectasis or infiltrate of the left lower lobe. Upper Abdomen: No acute abnormality. Musculoskeletal: No chest wall abnormality. No acute or significant osseous findings. IMPRESSION: 1. Interval placement of pigtail chest tube into right pleural effusion noted on prior exam. The  effusion is significantly smaller, with smaller loculated component seen in the upper lobe posteriorly. Small amount of air is noted within its posterior portion. 2. Right middle and lower lobe opacities are noted concerning for pneumonia. 3. Mild left pleural effusion is noted with associated atelectasis or infiltrate of the left lower lobe. 4. Coronary artery calcifications are noted suggesting coronary artery disease. 5. 4.1 cm ascending thoracic aortic aneurysm. Recommend annual imaging followup by CTA or MRA. This recommendation follows 2010 ACCF/AHA/AATS/ACR/ASA/SCA/SCAI/SIR/STS/SVM Guidelines for the Diagnosis and Management of Patients with Thoracic Aortic Disease. Circulation. 2010; 121: [id number]. Aortic aneurysm NOS  (ICD10-I71.9). 6. Aortic atherosclerosis. Aortic Atherosclerosis (ICD10-I70.0). Electronically Signed   By: Marijo Conception M.D.   On: 01/01/2021 16:45   CT Chest W Contrast  Result Date: 12/17/2020 CLINICAL DATA:  Follow-up for abnormal chest radiograph. Patient history of confusion/altered mental status. History of gastrointestinal carcinoma. EXAM: CT CHEST WITH CONTRAST TECHNIQUE: Multidetector CT imaging of the chest was performed during intravenous contrast administration. CONTRAST:  29mL OMNIPAQUE IOHEXOL 300 MG/ML  SOLN COMPARISON:  04/06/2020 and current chest radiograph FINDINGS: Cardiovascular: Heart is mildly enlarged. Dense three-vessel coronary artery calcifications. Ascending aorta dilated to 4.2 cm. Aortic atherosclerotic calcifications. Mediastinum/Nodes: No neck base, mediastinal or hilar masses or enlarged lymph nodes. Trachea is unremarkable esophagus is normal in caliber. Lungs/Pleura: Moderate partly loculated right pleural effusion. Loculated fluid is noted along the mid to upper oblique fissure. Bronchus intermedius is partly effaced. There is atelectasis a significant portion of the right lower lobe. Central opacity with mild bronchiectasis is noted in the posteroinferior right middle lobe. There is patchy opacity in a peribronchovascular distribution in the left lower lobe that may also be atelectasis, although has a configuration suggesting at least a component of pneumonia. 17 mm focus of ground-glass opacity is noted peripherally in the left upper lobe, centered on image 61, series 5. No evidence of pulmonary edema. No lung mass or suspicious nodule. No pneumothorax. Upper Abdomen: No acute findings. Status post cholecystectomy. Increase in colonic stool burden. Musculoskeletal: No fracture or acute finding.  No bone lesion. IMPRESSION: 1. Patchy peribronchovascular opacity in the left lower suspicious for pneumonia. 2. Small to moderate-sized partly loculated right pleural effusion.  Significant atelectasis of the right lower lobe, with a smaller area of atelectasis/scarring of the right middle lobe. 3. Small focus of ground-glass opacity in the peripheral left upper lobe. This may reflect additional infection. It also qualifies as a ground-glass nodule in follow-up is recommended. Initial follow-up with CT at 6-12 months is recommended to confirm persistence. If persistent, repeat CT is recommended every 2 years until 5 years of stability has been established. This recommendation follows the consensus statement: Guidelines for Management of Incidental Pulmonary Nodules Detected on CT Images: From the Fleischner Society 2017; Radiology 2017; 284:228-243. 4. Mild cardiomegaly. Dense three-vessel coronary artery calcifications. Aortic atherosclerosis. 5. Ascending aorta dilated to 4.1 cm. Recommend annual imaging followup by CTA or MRA. This recommendation follows 2010 ACCF/AHA/AATS/ACR/ASA/SCA/SCAI/SIR/STS/SVM Guidelines for the Diagnosis and Management of Patients with Thoracic Aortic Disease. Circulation. 2010; 121: W466-Z993. Aortic aneurysm NOS (ICD10-I71.9) Aortic Atherosclerosis (ICD10-I70.0). Electronically Signed   By: Lajean Manes M.D.   On: 12/17/2020 16:25   DG Chest Port 1 View  Result Date: 01/06/2021 CLINICAL DATA:  Empyema. EXAM: PORTABLE CHEST 1 VIEW COMPARISON:  One-view chest x-ray 01/05/2021 FINDINGS: Heart is mildly enlarged. Atherosclerotic changes are noted at the arch. Left pleural effusion is present. A right hydropneumothorax remains following removal of the right-sided  chest tube. There is no significant interval change. Pneumothorax measures 10-15%. Airspace opacities at the right base have increased some. IMPRESSION: 1. Stable right hydropneumothorax following removal of the right-sided chest tube. 2. Increasing airspace disease at the right base. This may represent atelectasis or inflammation associated with the previous chest tube. 3. Stable left pleural effusion  without pneumothorax. Electronically Signed   By: San Morelle M.D.   On: 01/06/2021 06:19   DG Chest Port 1 View  Result Date: 01/05/2021 CLINICAL DATA:  Follow-up empyema. EXAM: PORTABLE CHEST 1 VIEW COMPARISON:  Chest x-ray and chest CT 01/04/2021 FINDINGS: The upper right chest tube has been removed. The lower chest tube is still in place. There is a small persistent pneumothorax estimated at 5-10%. Stable small bilateral pleural effusions and bibasilar atelectasis. IMPRESSION: Persistent 5-10% right-sided pneumothorax (when compared to the prior chest CT). Electronically Signed   By: Marijo Sanes M.D.   On: 01/05/2021 08:19   DG Chest Port 1 View  Result Date: 01/04/2021 CLINICAL DATA:  Empyema.  Chest tube. EXAM: PORTABLE CHEST 1 VIEW COMPARISON:  01/03/2021.  CT 01/02/2021. FINDINGS: Two right chest tubes in stable position. Minimal residual right base pneumothorax cannot be excluded. Persistent bibasilar atelectasis and infiltrates. Small bilateral pleural effusions with interim progression on the left. Stable cardiomegaly. IMPRESSION: 1. Two right chest tubes in stable position. Minimal residual right base pneumothorax cannot be excluded. 2. Persistent bibasilar atelectasis and infiltrates. Small bilateral pleural effusions with interim progression on the left. Electronically Signed   By: Marcello Moores  Register   On: 01/04/2021 06:44   DG Chest Port 1 View  Result Date: 01/03/2021 CLINICAL DATA:  Pleural effusion. EXAM: PORTABLE CHEST 1 VIEW COMPARISON:  CT 01/02/2021, 01/01/2021.  Chest x-ray 01/02/2021. FINDINGS: New right pigtail chest tube noted over the right upper chest. Stable right lower pigtail chest tube. Small right base pneumothorax again noted. Persistent bibasilar infiltrates. Small bilateral pleural effusions. Stable cardiomegaly. IMPRESSION: 1. New right pigtail chest tube noted over the right upper chest. Stable right lower pigtail chest tube. Small right base pneumothorax again  noted. 2. Persistent bibasilar infiltrates without interim change. Small bilateral pleural effusions again noted. 3.  Stable cardiomegaly. Electronically Signed   By: Marcello Moores  Register   On: 01/03/2021 05:48   DG Chest Port 1 View  Result Date: 01/02/2021 CLINICAL DATA:  Empyema. EXAM: PORTABLE CHEST 1 VIEW COMPARISON:  CT 01/01/2021.  Chest x-ray 01/01/2021. FINDINGS: Right chest tube in stable position. Small right pleural effusion with a small amount of pleural air again noted. Persistent right base atelectasis. Persistent left base atelectasis/infiltrate small left pleural effusion. Cardiomegaly. No pulmonary venous congestion. IMPRESSION: 1. Right chest tube in stable position. Small right pleural effusion with a small amount of pleural air again noted. Persistent right base atelectasis. 2. Persistent left base atelectasis/infiltrate and small left pleural effusion. Electronically Signed   By: Marcello Moores  Register   On: 01/02/2021 06:52   DG CHEST PORT 1 VIEW  Result Date: 01/01/2021 CLINICAL DATA:  Empyema. EXAM: PORTABLE CHEST 1 VIEW COMPARISON:  12/30/2020 and CT chest 12/25/2020. FINDINGS: Patient is slightly rotated. Trachea is midline. Heart size stable. Small bore pigtail catheter is seen in the lower right hemithorax with a small right pleural effusion and right basilar atelectasis. No definite pneumothorax. Left lower lobe collapse/consolidation with a small to moderate left pleural effusion. IMPRESSION: 1. Small right pleural effusion with right basilar pulmonary parenchymal volume loss and chest tube in place. No definite pneumothorax. 2. Left  lower lobe collapse/consolidation with a small to moderate left pleural effusion. Electronically Signed   By: Lorin Picket M.D.   On: 01/01/2021 12:33   DG Chest Port 1 View  Result Date: 12/30/2020 CLINICAL DATA:  Empyema EXAM: PORTABLE CHEST 1 VIEW COMPARISON:  Yesterday FINDINGS: Right chest tube in similar position over the lower chest. Hazy  opacity of the right upper of the lower chest from reported empyema. Dense retrocardiac opacity with atelectatic appearance on most recent CT. No visible pneumothorax. No pulmonary edema. IMPRESSION: Continued right empyema and extensive atelectasis. Electronically Signed   By: Monte Fantasia M.D.   On: 12/30/2020 04:56   DG Chest Port 1 View  Result Date: 12/29/2020 CLINICAL DATA:  Empyema follow-up. EXAM: PORTABLE CHEST 1 VIEW COMPARISON:  Chest x-ray from yesterday at 1634 hours. FINDINGS: Loculated right pleural effusion appears decreased since the prior study with improved aeration of the right lung. Right-sided chest tube remains in place. No pneumothorax. Unchanged retrocardiac left lower lobe opacity. Stable cardiomediastinal silhouette. No acute osseous abnormality. IMPRESSION: 1. Decreased moderate loculated right pleural effusion with improved aeration of the right lung. 2. Unchanged left lower lobe atelectasis versus infiltrate. Electronically Signed   By: Titus Dubin M.D.   On: 12/29/2020 10:44   DG CHEST PORT 1 VIEW  Result Date: 12/28/2020 CLINICAL DATA:  Empyema. EXAM: PORTABLE CHEST 1 VIEW COMPARISON:  Chest x-ray from same day at 0543 hours. FINDINGS: Large loculated right pleural effusion appears increased with progressive atelectasis/collapse of the right lung. Right-sided chest tube remains in place. Unchanged retrocardiac left lower lobe opacity. Stable cardiomediastinal silhouette. No acute osseous abnormality. IMPRESSION: 1. Increased large loculated right pleural effusion with progressive atelectasis/collapse of the right lung. Right-sided chest tube remains in place. 2. Unchanged left lower lobe atelectasis versus infiltrate. Electronically Signed   By: Titus Dubin M.D.   On: 12/28/2020 16:46   DG Chest Port 1 View  Result Date: 12/28/2020 CLINICAL DATA:  RIGHT-sided chest tube. EXAM: PORTABLE CHEST 1 VIEW COMPARISON:  December 25, 2020, December 27, 2020 FINDINGS: The  cardiomediastinal silhouette is unchanged in contour.RIGHT-sided chest tube. Incomplete assessment of the RIGHT costophrenic angle. Moderate loculated RIGHT pleural effusion. This is similar in comparison to most recent prior but decreased since December 25, 2020. Decreased conspicuity of trace air component noted on CT. No LEFT-sided pneumothorax. Trace LEFT pleural effusion. Unchanged homogeneous opacification of the RIGHT lung base. Unchanged LEFT retrocardiac opacity, possibly atelectasis. Abdomen is out of the field of view. Multilevel degenerative changes of the thoracic spine. IMPRESSION: 1. Unchanged moderate RIGHT loculated pleural effusion since most recent prior. It is overall decreased since December 25, 2020 2. Unchanged LEFT retrocardiac opacity. Electronically Signed   By: Valentino Saxon MD   On: 12/28/2020 07:51   DG CHEST PORT 1 VIEW  Result Date: 12/27/2020 CLINICAL DATA:  Empyema. EXAM: PORTABLE CHEST 1 VIEW COMPARISON:  CT 12/25/2020. FINDINGS: Chest tube noted over right lower chest. Right pleural effusion/empyema again noted, decreased in size from prior exam. Persistent bibasilar atelectasis/infiltrates. Stable cardiomegaly. IMPRESSION: 1. Chest tube noted over the right lower chest. Right pleural effusion/empyema again noted, decreased in size from prior exam. 2.  Persistent bibasilar atelectasis/infiltrates. 3.  Stable cardiomegaly. Electronically Signed   By: Marcello Moores  Register   On: 12/27/2020 05:59   DG Chest Port 1 View  Result Date: 12/17/2020 CLINICAL DATA:  87 year old male with altered mental status. Confusion. History of gastrointestinal cancer stated on prior CT Chest, Abdomen, and Pelvis -  most recently 04/06/2020. EXAM: PORTABLE CHEST 1 VIEW COMPARISON:  Portable chest 09/11/2020 and earlier, including chest CT 04/26/2020. FINDINGS: Portable AP semi upright view at 1141 hours. Moderate veiling opacity in the right lower lung is new since October. Obscured right  hemidiaphragm. Lung volumes appear stable. Mediastinal contours appear stable. Allowing for portable technique the left lung is clear. No pneumothorax or pulmonary edema. Visualized tracheal air column is within normal limits. Stable surgical clips in the right upper quadrant. Chronic right lateral rib fractures appear stable. IMPRESSION: 1. Moderate size new veiling opacity in the right lower lung. This could be a moderate size right pleural effusion with atelectasis, or a combination of pleural fluid with pneumonia or new lung mass (history of GI stromal tumor). Chest CT (IV contrast preferred) would best characterize further. 2. Left lung remains clear. Electronically Signed   By: Genevie Ann M.D.   On: 12/17/2020 11:59   DG Chest Port 1V same Day  Result Date: 12/25/2020 CLINICAL DATA:  Pneumonia EXAM: PORTABLE CHEST 1 VIEW COMPARISON:  12/18/2020 FINDINGS: Dense right upper lobe opacity where there was loculated pleural fluid by prior CT and now the shape of a right upper lobe collapse. Increased dense right lower lobe opacity. Continued retrocardiac airspace disease. Normal heart size. IMPRESSION: 1. Significant worsening of right-sided aeration from increasing loculated pleural fluid. There is likely superimposed right upper lobe collapse. 2. Continued retrocardiac infiltrate. Electronically Signed   By: Monte Fantasia M.D.   On: 12/25/2020 04:16   ECHOCARDIOGRAM COMPLETE  Result Date: 12/30/2020    ECHOCARDIOGRAM REPORT   Patient Name:   Konstantinos Cordoba. Date of Exam: 12/30/2020 Medical Rec #:  [id number]          Height:       71.0 in Accession #:    [id number]         Weight:       144.2 lb Date of Birth:  Feb 14, 1926         BSA:          1.835 m Patient Age:    16 years           BP:           81/59 mmHg Patient Gender: M                  HR:           120 bpm. Exam Location:  Inpatient Procedure: 2D Echo, Cardiac Doppler and Color Doppler Indications:    I48.1 Persistent atrial fibrillation   History:        Patient has prior history of Echocardiogram examinations, most                 recent 12/31/2017. Risk Factors:Sleep Apnea. Cancer.  Sonographer:    Jonelle Sidle Dance Referring Phys: [place]  1. Left ventricular ejection fraction, by estimation, is 40 to 45%. Difficult to determine systolic function as poor image quality and patient is in Afib with RVR, but appears left ventricle has mildly decreased function. Would consider repeat limited echo with contrast once heart rate better controlled. Left ventricular endocardial border not optimally defined to evaluate regional wall motion. There is mild left ventricular hypertrophy. Left ventricular diastolic parameters are indeterminate.  2. Right ventricular systolic function is mildly reduced. The right ventricular size is mildly enlarged. There is normal pulmonary artery systolic pressure. The estimated right ventricular systolic pressure is 86.5 mmHg.  3. Left atrial size  was moderately dilated.  4. The mitral valve is degenerative. No evidence of mitral valve regurgitation. Moderate mitral annular calcification.  5. The aortic valve was not well visualized. Aortic valve regurgitation is trivial. No aortic stenosis is present.  6. Aortic dilatation noted. There is mild dilatation of the aortic root, measuring 42 mm. There is mild dilatation of the ascending aorta, measuring 41 mm.  7. The inferior vena cava is dilated in size with <50% respiratory variability, suggesting right atrial pressure of 15 mmHg. FINDINGS  Left Ventricle: Left ventricular ejection fraction, by estimation, is 40 to 45%. The left ventricle has mildly decreased function. Left ventricular endocardial border not optimally defined to evaluate regional wall motion. The left ventricular internal cavity size was small. There is mild left ventricular hypertrophy. Left ventricular diastolic parameters are indeterminate. Right Ventricle: The right ventricular size is  mildly enlarged. Right vetricular wall thickness was not well visualized. Right ventricular systolic function is mildly reduced. There is normal pulmonary artery systolic pressure. The tricuspid regurgitant velocity is 2.08 m/s, and with an assumed right atrial pressure of 15 mmHg, the estimated right ventricular systolic pressure is 24.0 mmHg. Left Atrium: Left atrial size was moderately dilated. Right Atrium: Right atrial size was normal in size. Pericardium: There is no evidence of pericardial effusion. Mitral Valve: The mitral valve is degenerative in appearance. Moderate mitral annular calcification. No evidence of mitral valve regurgitation. Tricuspid Valve: The tricuspid valve is normal in structure. Tricuspid valve regurgitation is trivial. Aortic Valve: The aortic valve was not well visualized. Aortic valve regurgitation is trivial. Aortic regurgitation PHT measures 352 msec. No aortic stenosis is present. Pulmonic Valve: The pulmonic valve was not well visualized. Pulmonic valve regurgitation is not visualized. Aorta: Aortic dilatation noted. There is mild dilatation of the aortic root, measuring 42 mm. There is mild dilatation of the ascending aorta, measuring 41 mm. Venous: The inferior vena cava is dilated in size with less than 50% respiratory variability, suggesting right atrial pressure of 15 mmHg. IAS/Shunts: The interatrial septum was not well visualized.  LEFT VENTRICLE PLAX 2D LVIDd:         3.70 cm LVIDs:         2.70 cm LV PW:         1.30 cm LV IVS:        1.10 cm LVOT diam:     2.20 cm LV SV:         40 LV SV Index:   22 LVOT Area:     3.80 cm  RIGHT VENTRICLE          IVC RV Basal diam:  3.70 cm  IVC diam: 2.20 cm RV Mid diam:    2.90 cm TAPSE (M-mode): 1.8 cm LEFT ATRIUM           Index       RIGHT ATRIUM           Index LA diam:      4.90 cm 2.67 cm/m  RA Area:     17.80 cm LA Vol (A2C): 81.3 ml 44.31 ml/m RA Volume:   45.60 ml  24.85 ml/m LA Vol (A4C): 79.7 ml 43.44 ml/m  AORTIC  VALVE LVOT Vmax:   67.30 cm/s LVOT Vmean:  49.000 cm/s LVOT VTI:    0.105 m AI PHT:      352 msec  AORTA Ao Root diam: 4.20 cm Ao Asc diam:  4.50 cm MITRAL VALVE  TRICUSPID VALVE MV Area (PHT): 3.08 cm    TR Peak grad:   17.3 mmHg MV Decel Time: 246 msec    TR Vmax:        208.00 cm/s MV E velocity: 59.50 cm/s MV A velocity: 52.80 cm/s  SHUNTS MV E/A ratio:  1.13        Systemic VTI:  0.10 m                            Systemic Diam: 2.20 cm Oswaldo Milian MD Electronically signed by Oswaldo Milian MD Signature Date/Time: 12/30/2020/2:26:21 PM    Final    CT IMAGE GUIDED FLUID DRAIN BY CATHETER  Result Date: 12/27/2020 INDICATION: Loculated right-sided pleural effusion. Please perform CT-guided chest tube placement for infection source purposes and potentially for the administration intrapleural tPA. EXAM: CT IMAGE GUIDED FLUID DRAIN BY CATHETER COMPARISON:  None. MEDICATIONS: The patient is currently admitted to the hospital and receiving intravenous antibiotics. The antibiotics were administered within an appropriate time frame prior to the initiation of the procedure. ANESTHESIA/SEDATION: None CONTRAST:  None COMPLICATIONS: None immediate. PROCEDURE: Informed written consent was obtained from the patient after a discussion of the risks, benefits and alternatives to treatment. The patient was placed supine, slightly RPO on the CT gantry and a pre procedural CT was performed re-demonstrating the known complex partially loculated right-sided pleural effusion. An access site along the inferior lateral aspect of the right chest wall was marked and the procedure was planned. A timeout was performed prior to the initiation of the procedure. The skin overlying the inferior aspect the right lateral chest wall was prepped and draped in the usual sterile fashion. The overlying soft tissues were anesthetized with 1% lidocaine with epinephrine. Appropriate trajectory was planned with the use of a  22 gauge spinal needle. An 18 gauge trocar needle was advanced into the abscess/fluid collection and a short Amplatz super stiff wire was coiled within the collection. Appropriate positioning was confirmed with a limited CT scan. The tract was serially dilated allowing placement of a 14 French all-purpose drainage catheter. Appropriate positioning was confirmed with a limited postprocedural CT scan. Approximately 20 cc of blood tinged serous pleural fluid was aspirated. The tube was connected to a pleura vac device and sutured in place. A dressing was placed. The patient tolerated the procedure well without immediate post procedural complication. IMPRESSION: Successful CT guided placement of a 38 French all purpose drain catheter into the basilar aspect of the right pleural space with aspiration of 20 cc of blood tinged serous pleural fluid. Samples were sent to the laboratory as requested by the ordering clinical team. Electronically Signed   By: Sandi Mariscal M.D.   On: 12/27/2020 08:23   CT IMAGE GUIDED DRAINAGE BY PERCUTANEOUS CATHETER  Result Date: 01/02/2021 INDICATION: Right-sided empyema and status post CT-guided percutaneous drainage catheter placement on 12/26/2020. Follow-up CT demonstrates improvement but some persistent loculated fluid in the superior and lateral hemithorax. Request made to try to drain this fluid as the patient is still requiring pressor support. EXAM: CT-GUIDED RIGHT-SIDED THORACOSTOMY TUBE PLACEMENT MEDICATIONS: The patient is currently admitted to the hospital and receiving intravenous antibiotics. The antibiotics were administered within an appropriate time frame prior to the initiation of the procedure. ANESTHESIA/SEDATION: No conscious sedation was administered due to tenuous blood pressure. COMPLICATIONS: Small pneumothorax. SIR LEVEL B - Normal therapy, includes overnight admission for observation. PROCEDURE: Informed written consent was obtained from the  patient after a  thorough discussion of the procedural risks, benefits and alternatives. All questions were addressed. Maximal Sterile Barrier Technique was utilized including caps, mask, sterile gowns, sterile gloves, sterile drape, hand hygiene and skin antiseptic. A timeout was performed prior to the initiation of the procedure. Initial CT was performed in a decubitus position with the right side up. The right-sided was then rolled down slightly and additional localizing CT performed. The skin was prepped with chlorhexidine. Local anesthesia was provided with 1% lidocaine. Under CT guidance, an 18 gauge trocar needle was advanced into the posterolateral right pleural space. A guidewire was then advanced via the needle and the needle removed. The percutaneous tract was dilated and a 12 French pigtail drainage catheter advanced into the right lateral pleural space. Catheter position was confirmed by CT. The catheter was then secured at the skin with a Prolene retention suture, StatLock device and dressing. FINDINGS: Initial imaging demonstrates a small area of loculated lentiform shaped fluid in the posterolateral right upper hemithorax. This was targeted for chest tube placement and during needle advancement, the fluid gradually diminished and dissipated consistent with likely a free-flowing component posteriorly and along the course of the major fissure. As the needle was advanced into the pleural space, there was no fluid return and imaging demonstrated a lateral pneumothorax. A guidewire was able to be advanced into the pleural space allowing placement of a drainage catheter. After drain placement, aspiration yielded only air and no fluid return. The drain was connected to a [place] Pleur-evac device and will be connected to wall suction at -20 cm of water. IMPRESSION: During planned percutaneous drainage catheter placement into what appear to be a loculated residual component of empyema in the superior right posterolateral  pleural space, the fluid appeared to dissipate as a trocar needle was advanced and resulted in development of a pneumothorax. This was treated by placement of a 12 French pigtail catheter into the lateral pleural space which will be connected to a Pleur-evac device and wall suction. The fluid component clearly dissipated away consistent with a free-flowing component in the pleural space which was likely promoted when the patient was in a decubitus position. Electronically Signed   By: Aletta Edouard M.D.   On: 01/02/2021 17:19   US THORACENTESIS ASP PLEURAL SPACE W/IMG GUIDE  Result Date: 12/18/2020 INDICATION: Patient with history of pneumonia, encephalopathy, atrial fibrillation, loculated right pleural effusion; request received for diagnostic and therapeutic right thoracentesis. EXAM: ULTRASOUND GUIDED DIAGNOSTIC AND THERAPEUTIC RIGHT THORACENTESIS MEDICATIONS: 1% lidocaine to skin and subcutaneous tissue COMPLICATIONS: None immediate. PROCEDURE: An ultrasound guided thoracentesis was thoroughly discussed with the patient's son and questions answered. The benefits, risks, alternatives and complications were also discussed. The patient's son understands and wishes to proceed with the procedure. Written consent was obtained. Ultrasound was performed to localize and mark an adequate pocket of fluid in the right chest. The area was then prepped and draped in the normal sterile fashion. 1% Lidocaine was used for local anesthesia. Under ultrasound guidance a 6 Fr Safe-T-Centesis catheter was introduced. Thoracentesis was performed. The catheter was removed and a dressing applied. FINDINGS: A total of approximately 225 cc of hazy,amber fluid was removed. Samples were sent to the laboratory as requested by the clinical team. The pleural effusion was multiloculated by ultrasound today. IMPRESSION: Successful ultrasound guided diagnostic and therapeutic right thoracentesis yielding 225 cc of pleural fluid. Read by:  Rowe Robert, PA-C Electronically Signed   By: Sandi Mariscal M.D.   On:  12/18/2020 13:35      Subjective: Patient interviewed and examined along with his son at bedside.  Awake, alert and oriented x2.  Denies complaints other than feeling sore due to lying in bed.  No other pain or dyspnea reported.  As per son, some right upper extremity swelling without pain.  Tolerated breakfast.  Per RN, no acute issues.  Discharge Exam:  Vitals:   01/09/21 0949 01/09/21 1240 01/09/21 2020 01/10/21 0401  BP: 125/64 115/64 113/74 109/66  Pulse: 88 84 97 91  Resp:  16 (!) 24 15  Temp:  97.7 F (36.5 C) 98.4 F (36.9 C) 98.2 F (36.8 C)  TempSrc:  Oral Oral Oral  SpO2:  94% 98% 94%  Weight:      Height:        General: Elderly male, moderately built, frail and chronically ill looking lying comfortably propped up in bed without distress.  Is on room air. Cardiovascular: S1 & S2 heard, irregularly irregular, S1/S2 +. No murmurs, rubs, gallops or clicks. No JVD or pedal edema.  Telemetry personally reviewed: A. fib with rates ranging between 80s-100s. Respiratory: Diminished breath sounds in the bases but otherwise clear to auscultation without wheezing, rhonchi or crackles.  No increased work of breathing.   Abdominal:  Non distended, non tender & soft. No organomegaly or masses appreciated. Normal bowel sounds heard. CNS: Alert and oriented. No focal deficits. Extremities: no edema, no cyanosis.  Right upper arm with mild swelling, soft, no clinical features suggestive of infection, suspect IV infiltration, already on anticoagulation and low index of suspicion for DVT.  Recommended right upper extremity elevation.    The results of significant diagnostics from this hospitalization (including imaging, microbiology, ancillary and laboratory) are listed below for reference.     Microbiology: Recent Results (from the past 240 hour(s))  SARS CORONAVIRUS 2 (TAT 6-24 HRS) Nasopharyngeal Nasopharyngeal  Swab     Status: None   Collection Time: 01/09/21 12:13 PM   Specimen: Nasopharyngeal Swab  Result Value Ref Range Status   SARS Coronavirus 2 NEGATIVE NEGATIVE Final    Comment: (NOTE) SARS-CoV-2 target nucleic acids are NOT DETECTED.  The SARS-CoV-2 RNA is generally detectable in upper and lower respiratory specimens during the acute phase of infection. Negative results do not preclude SARS-CoV-2 infection, do not rule out co-infections with other pathogens, and should not be used as the sole basis for treatment or other patient management decisions. Negative results must be combined with clinical observations, patient history, and epidemiological information. The expected result is Negative.  Fact Sheet for Patients: SugarRoll.be  Fact Sheet for Healthcare Providers: https://www.woods-mathews.com/  This test is not yet approved or cleared by the [place] FDA and  has been authorized for detection and/or diagnosis of SARS-CoV-2 by FDA under an Emergency Use Authorization (EUA). This EUA will remain  in effect (meaning this test can be used) for the duration of the COVID-19 declaration under Se ction 564(b)(1) of the Act, 21 U.S.C. section 360bbb-3(b)(1), unless the authorization is terminated or revoked sooner.  Performed at [facility], [place] [address] [place], [place] [address]      Labs: CBC: Recent Labs  Lab 01/04/21 0253 01/05/21 0246 01/06/21 0241 01/07/21 0604 01/08/21 0448 01/09/21 0502 01/10/21 0535  WBC 9.5 9.0 12.7* 12.5*  --   --   --   NEUTROABS  --   --  11.0* 9.7*  --   --   --   HGB 11.7* 11.6* 11.4*  12.8* 12.0* 11.6* 11.3*  HCT 36.9* 36.7* 36.4* 40.8 39.0 36.7* 36.2*  MCV 86.8 87.2 89.0 88.9  --   --   --   PLT 367 374 364 297  --   --   --     Basic Metabolic Panel: Recent Labs  Lab 01/05/21 0246 01/06/21 0241 01/07/21 0604 01/08/21 0448 01/09/21 0502 01/10/21 0535  NA 140 141 142  139 137 138  K 3.2* 3.9 4.9 4.3 3.9 4.2  CL 104 109 107 101 99 100  CO2 25 26 27 28 31 31   GLUCOSE 111* 123* 76 79 84 90  BUN 23 24* 21 19 18 13   CREATININE 0.91 0.96 0.89 0.74 0.69 0.59*  CALCIUM 7.9* 8.1* 7.8* 7.7* 7.5* 7.6*  MG 2.1 2.2 2.0 2.2 2.0 2.0  PHOS 2.6 1.8* 3.6 3.1  --  2.6    Liver Function Tests: Recent Labs  Lab 01/07/21 0604 01/08/21 0448 01/10/21 0535  ALBUMIN 1.7* 1.8* 1.8*    CBG: Recent Labs  Lab 01/09/21 2020 01/10/21 0003 01/10/21 0359 01/10/21 0733 01/10/21 1127  GLUCAP 168* 86 79 77 116*     Urinalysis    Component Value Date/Time   COLORURINE YELLOW 12/28/2020 0934   APPEARANCEUR HAZY (A) 12/28/2020 0934   LABSPEC 1.026 12/28/2020 0934   PHURINE 5.0 12/28/2020 0934   GLUCOSEU NEGATIVE 12/28/2020 0934   HGBUR SMALL (A) 12/28/2020 0934   BILIRUBINUR NEGATIVE 12/28/2020 0934   KETONESUR 5 (A) 12/28/2020 0934   PROTEINUR 30 (A) 12/28/2020 0934   UROBILINOGEN 0.2 07/18/2015 2220   NITRITE NEGATIVE 12/28/2020 0934   LEUKOCYTESUR TRACE (A) 12/28/2020 0934   I discussed in detail with patient's son at bedside, updated care and answered questions.   Time coordinating discharge: 45 minutes  SIGNED:  Vernell Leep, MD, [place], [facility]  To contact the attending provider between 7A-7P or the covering provider during after hours 7P-7A, please log into the web site [email or web address] and access using universal [place] password for that web site. If you do not have the password, please call the hospital operator.

## 2021-01-10 NOTE — Discharge Instructions (Signed)

## 2021-01-10 NOTE — TOC Transition Note (Signed)
Transition of Care [facility]) - CM/SW Discharge Note   Patient Details  Name: Casey Gates. MRN: [id number] Date of Birth: [birth year]  [facility] CM/SW Contact:  Dessa Phi, RN Phone Number: 01/10/2021, 11:32 AM   Clinical Narrative:[facility] has bed available-Per attending if cleared by cardio,then plan for d/c to [facility] for transport.       Final next level of care: Skilled Nursing Facility Barriers to Discharge: No Barriers Identified   Patient Goals and CMS Choice Patient states their goals for this hospitalization and ongoing recovery are:: go to rehab CMS Medicare.gov Compare Post Acute Care list provided to:: Patient Represenative (must comment) Choice offered to / list presented to : Adult Children  Discharge Placement PASRR number recieved: 01/08/21            Patient chooses bed at: [facility] Patient to be transferred to facility by: [facility] Name of family member notified: [name] son [phone] Patient and family notified of of transfer: 01/10/21  Discharge Plan and Services   Discharge Planning Services: CM Consult                                 Social Determinants of Health ([place]) Interventions     Readmission Risk Interventions No flowsheet data found.

## 2021-01-10 NOTE — Progress Notes (Addendum)
Progress Note  Patient Name: Casey Gates. Date of Encounter: 01/10/2021  Primary Cardiologist: Larae Grooms, MD  Subjective   No specific complaints today. HR relatively controlled in the high 90's  Inpatient Medications    Scheduled Meds: . amoxicillin-clavulanate  1 tablet Oral Q12H  . apixaban  2.5 mg Oral BID  . artificial tears  1 application Left Eye TID  . budesonide (PULMICORT) nebulizer solution  0.5 mg Nebulization BID  . chlorhexidine  15 mL Mouth Rinse BID  . Chlorhexidine Gluconate Cloth  6 each Topical Daily  . digoxin  0.0625 mg Oral Daily  . feeding supplement  237 mL Oral BID BM  . mouth rinse  15 mL Mouth Rinse q12n4p  . metoprolol tartrate  25 mg Oral BID   Continuous Infusions: . sodium chloride Stopped (01/02/21 1136)   PRN Meds: fentaNYL (SUBLIMAZE) injection, ipratropium-albuterol, lidocaine (PF), lip balm, loperamide, metoprolol tartrate, ondansetron **OR** ondansetron (ZOFRAN) IV, oxyCODONE, Resource ThickenUp Clear   Vital Signs    Vitals:   01/09/21 0949 01/09/21 1240 01/09/21 2020 01/10/21 0401  BP: 125/64 115/64 113/74 109/66  Pulse: 88 84 97 91  Resp:  16 (!) 24 15  Temp:  97.7 F (36.5 C) 98.4 F (36.9 C) 98.2 F (36.8 C)  TempSrc:  Oral Oral Oral  SpO2:  94% 98% 94%  Weight:      Height:        Intake/Output Summary (Last 24 hours) at 01/10/2021 0746 Last data filed at 01/09/2021 2022 Gross per 24 hour  Intake 480 ml  Output 400 ml  Net 80 ml   Filed Weights   12/29/20 0615 12/31/20 0630 01/06/21 2140  Weight: 65.4 kg 65.1 kg 68.7 kg    Physical Exam   General: Elderly, NAD Neck: Negative for carotid bruits. No JVD Lungs:Clear to ausculation bilaterally. Breathing is unlabored. Cardiovascular: Irregularly irregular. Abdomen: Soft, non-tender, non-distended. No obvious abdominal masses. Extremities: No edema. Radial pulses 2+ bilaterally Neuro: Alert and oriented. No focal deficits. No facial asymmetry.  MAE spontaneously. Psych: Responds to questions appropriately with normal affect.    Labs    Chemistry Recent Labs  Lab 01/07/21 0604 01/08/21 0448 01/09/21 0502 01/10/21 0535  NA 142 139 137 138  K 4.9 4.3 3.9 4.2  CL 107 101 99 100  CO2 27 28 31 31   GLUCOSE 76 79 84 90  BUN 21 19 18 13   CREATININE 0.89 0.74 0.69 0.59*  CALCIUM 7.8* 7.7* 7.5* 7.6*  ALBUMIN 1.7* 1.8*  --  1.8*  GFRNONAA >60 >60 >60 >60  ANIONGAP 8 10 7 7      Hematology Recent Labs  Lab 01/05/21 0246 01/06/21 0241 01/07/21 0604 01/08/21 0448 01/09/21 0502 01/10/21 0535  WBC 9.0 12.7* 12.5*  --   --   --   RBC 4.21* 4.09* 4.59  --   --   --   HGB 11.6* 11.4* 12.8* 12.0* 11.6* 11.3*  HCT 36.7* 36.4* 40.8 39.0 36.7* 36.2*  MCV 87.2 89.0 88.9  --   --   --   MCH 27.6 27.9 27.9  --   --   --   MCHC 31.6 31.3 31.4  --   --   --   RDW 14.7 14.9 15.5  --   --   --   PLT 374 364 297  --   --   --     Cardiac EnzymesNo results for input(s): TROPONINI in the last 168 hours.  No results for input(s): TROPIPOC in the last 168 hours.   BNPNo results for input(s): BNP, PROBNP in the last 168 hours.   DDimer No results for input(s): DDIMER in the last 168 hours.   Radiology    No results found.  Telemetry    01/10/21 AF with HR in the low upper 90's- Personally Reviewed  ECG    No new tracing as of 01/10/21- Personally Reviewed  Cardiac Studies   Echo 12/30/20: 1. Left ventricular ejection fraction, by estimation, is 40 to 45%.  Difficult to determine systolic function as poor image quality and patient  is in Afib with RVR, but appears left ventricle has mildly decreased  function. Would consider repeat limited  echo with contrast once heart rate better controlled. Left ventricular  endocardial border not optimally defined to evaluate regional wall motion.  There is mild left ventricular hypertrophy. Left ventricular diastolic  parameters are indeterminate.  2. Right ventricular systolic function  is mildly reduced. The right  ventricular size is mildly enlarged. There is normal pulmonary artery  systolic pressure. The estimated right ventricular systolic pressure is  76.2 mmHg.  3. Left atrial size was moderately dilated.  4. The mitral valve is degenerative. No evidence of mitral valve  regurgitation. Moderate mitral annular calcification.  5. The aortic valve was not well visualized. Aortic valve regurgitation  is trivial. No aortic stenosis is present.  6. Aortic dilatation noted. There is mild dilatation of the aortic root,  measuring 42 mm. There is mild dilatation of the ascending aorta,  measuring 41 mm.  7. The inferior vena cava is dilated in size with <50% respiratory  variability, suggesting right atrial pressure of 15 mmHg.   Patient Profile     87 y.o. 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 Hospitalization has been complicated by atrial fibrillation with RVR and hypotension.   Assessment & Plan    1. Acute systolic and diastolic heart failure -Echocardiogram this admission with LVEF at 40-45%>>>down from 65-70% on previous study. Had mixed cardiogenic/septic shock on admission which is resolving. No longer on pressors  -Increase metoprolol to 37.5 mg BID due to elevated rates -Can possibly add ACEI/ARB in the OP setting if BP allows  -BP low normal at 113/74  2. Persistent atrial fibrillation: -Rates slightly improved>>>90's will increase metoprolol to 37.5 mg BID -Preciously treated with amiodarone however this was discontinued due to persistent AF -Currently on digoxin and low dose -Continue Eliquis for AC  3. Recent COVID-19 infection: -SpO2 stable in the mid-90's on RA   4. Hospital acquired PNA with right sided  loculated pleural effusion: -Management per IM  -Planning 14-day course of abx   [facility] will sign off.   Medication Recommendations:  Continue Eliquis, digoxin 0.0625 mg daily, metoprolol 37.5 mg BID Other recommendations (labs, testing, etc):  Check digoxin level within 1 week Follow up as an outpatient:  Will arrange follow-up  Signed, Kathyrn Drown NP-C [facility] Pager: [phone] 01/10/2021, 7:46 AM     For questions or updates, please contact   Please consult [email or web address] for contact info under Cardiology/STEMI.  Patient seen and examined.  Agree with above documentation.  On exam, patient is alert, normal rate, irregular, no murmurs, lungs CTAB, no LE edema or JVD.  Telemetry reviewed, shows AF with rates 80-100s.  Overall, appears  fairly well rate controlled though mildly elevated at times.  Will increase metoprolol dose to 37.5 mg BID.  Would continue digoxin and Eliquis.  Check digoxin level in 1 week.  OK for discharge from cardiology standpoint.  Donato Heinz, MD

## 2021-01-10 NOTE — Plan of Care (Signed)

## 2021-01-11 DIAGNOSIS — J9601 Acute respiratory failure with hypoxia: Secondary | ICD-10-CM | POA: Diagnosis not present

## 2021-01-11 DIAGNOSIS — D649 Anemia, unspecified: Secondary | ICD-10-CM | POA: Diagnosis not present

## 2021-01-11 DIAGNOSIS — J869 Pyothorax without fistula: Secondary | ICD-10-CM | POA: Diagnosis not present

## 2021-01-11 DIAGNOSIS — J189 Pneumonia, unspecified organism: Secondary | ICD-10-CM | POA: Diagnosis not present

## 2021-01-11 DIAGNOSIS — G9341 Metabolic encephalopathy: Secondary | ICD-10-CM | POA: Diagnosis not present

## 2021-01-11 DIAGNOSIS — I480 Paroxysmal atrial fibrillation: Secondary | ICD-10-CM | POA: Diagnosis not present

## 2021-01-11 DIAGNOSIS — C61 Malignant neoplasm of prostate: Secondary | ICD-10-CM | POA: Diagnosis not present

## 2021-01-11 DIAGNOSIS — I77810 Thoracic aortic ectasia: Secondary | ICD-10-CM | POA: Diagnosis not present

## 2021-01-11 DIAGNOSIS — G894 Chronic pain syndrome: Secondary | ICD-10-CM | POA: Diagnosis not present

## 2021-01-12 DIAGNOSIS — I480 Paroxysmal atrial fibrillation: Secondary | ICD-10-CM | POA: Diagnosis not present

## 2021-01-12 DIAGNOSIS — R338 Other retention of urine: Secondary | ICD-10-CM | POA: Diagnosis not present

## 2021-01-12 DIAGNOSIS — R531 Weakness: Secondary | ICD-10-CM | POA: Diagnosis not present

## 2021-01-12 DIAGNOSIS — A419 Sepsis, unspecified organism: Secondary | ICD-10-CM | POA: Diagnosis not present

## 2021-01-12 DIAGNOSIS — G9340 Encephalopathy, unspecified: Secondary | ICD-10-CM | POA: Diagnosis not present

## 2021-01-12 DIAGNOSIS — E46 Unspecified protein-calorie malnutrition: Secondary | ICD-10-CM | POA: Diagnosis not present

## 2021-01-12 DIAGNOSIS — J189 Pneumonia, unspecified organism: Secondary | ICD-10-CM | POA: Diagnosis not present

## 2021-01-12 DIAGNOSIS — G8929 Other chronic pain: Secondary | ICD-10-CM | POA: Diagnosis not present

## 2021-01-12 DIAGNOSIS — Z8546 Personal history of malignant neoplasm of prostate: Secondary | ICD-10-CM | POA: Diagnosis not present

## 2021-01-16 DIAGNOSIS — I480 Paroxysmal atrial fibrillation: Secondary | ICD-10-CM | POA: Diagnosis not present

## 2021-01-16 DIAGNOSIS — G9341 Metabolic encephalopathy: Secondary | ICD-10-CM | POA: Diagnosis not present

## 2021-01-16 DIAGNOSIS — C61 Malignant neoplasm of prostate: Secondary | ICD-10-CM | POA: Diagnosis not present

## 2021-01-16 DIAGNOSIS — R531 Weakness: Secondary | ICD-10-CM | POA: Diagnosis not present

## 2021-01-16 DIAGNOSIS — G894 Chronic pain syndrome: Secondary | ICD-10-CM | POA: Diagnosis not present

## 2021-01-16 DIAGNOSIS — J189 Pneumonia, unspecified organism: Secondary | ICD-10-CM | POA: Diagnosis not present

## 2021-01-16 DIAGNOSIS — E440 Moderate protein-calorie malnutrition: Secondary | ICD-10-CM | POA: Diagnosis not present

## 2021-01-16 DIAGNOSIS — R339 Retention of urine, unspecified: Secondary | ICD-10-CM | POA: Diagnosis not present

## 2021-01-18 DIAGNOSIS — L89133 Pressure ulcer of right lower back, stage 3: Secondary | ICD-10-CM | POA: Diagnosis not present

## 2021-01-20 DIAGNOSIS — R339 Retention of urine, unspecified: Secondary | ICD-10-CM | POA: Diagnosis not present

## 2021-01-20 DIAGNOSIS — I480 Paroxysmal atrial fibrillation: Secondary | ICD-10-CM | POA: Diagnosis not present

## 2021-01-20 DIAGNOSIS — G894 Chronic pain syndrome: Secondary | ICD-10-CM | POA: Diagnosis not present

## 2021-01-20 DIAGNOSIS — E440 Moderate protein-calorie malnutrition: Secondary | ICD-10-CM | POA: Diagnosis not present

## 2021-01-20 DIAGNOSIS — R531 Weakness: Secondary | ICD-10-CM | POA: Diagnosis not present

## 2021-01-20 DIAGNOSIS — I77810 Thoracic aortic ectasia: Secondary | ICD-10-CM | POA: Diagnosis not present

## 2021-01-22 ENCOUNTER — Ambulatory Visit: Payer: Medicare PPO

## 2021-01-22 ENCOUNTER — Ambulatory Visit: Payer: Medicare PPO | Admitting: Pulmonary Disease

## 2021-01-22 ENCOUNTER — Other Ambulatory Visit: Payer: Self-pay

## 2021-01-22 VITALS — BP 0/0 | HR 0

## 2021-01-22 NOTE — Progress Notes (Signed)
Patient no showed

## 2021-01-25 DIAGNOSIS — L89133 Pressure ulcer of right lower back, stage 3: Secondary | ICD-10-CM | POA: Diagnosis not present

## 2021-01-26 DIAGNOSIS — G894 Chronic pain syndrome: Secondary | ICD-10-CM | POA: Diagnosis not present

## 2021-01-26 DIAGNOSIS — J869 Pyothorax without fistula: Secondary | ICD-10-CM | POA: Diagnosis not present

## 2021-01-26 DIAGNOSIS — I480 Paroxysmal atrial fibrillation: Secondary | ICD-10-CM | POA: Diagnosis not present

## 2021-01-26 DIAGNOSIS — E440 Moderate protein-calorie malnutrition: Secondary | ICD-10-CM | POA: Diagnosis not present

## 2021-01-26 DIAGNOSIS — R339 Retention of urine, unspecified: Secondary | ICD-10-CM | POA: Diagnosis not present

## 2021-01-26 DIAGNOSIS — R531 Weakness: Secondary | ICD-10-CM | POA: Diagnosis not present

## 2021-01-26 DIAGNOSIS — C61 Malignant neoplasm of prostate: Secondary | ICD-10-CM | POA: Diagnosis not present

## 2021-01-30 DIAGNOSIS — I5043 Acute on chronic combined systolic (congestive) and diastolic (congestive) heart failure: Secondary | ICD-10-CM | POA: Diagnosis not present

## 2021-01-30 DIAGNOSIS — J9601 Acute respiratory failure with hypoxia: Secondary | ICD-10-CM | POA: Diagnosis not present

## 2021-01-30 DIAGNOSIS — R918 Other nonspecific abnormal finding of lung field: Secondary | ICD-10-CM | POA: Diagnosis not present

## 2021-01-30 DIAGNOSIS — D649 Anemia, unspecified: Secondary | ICD-10-CM | POA: Diagnosis not present

## 2021-01-30 DIAGNOSIS — G8929 Other chronic pain: Secondary | ICD-10-CM | POA: Diagnosis not present

## 2021-01-30 DIAGNOSIS — I4891 Unspecified atrial fibrillation: Secondary | ICD-10-CM | POA: Diagnosis not present

## 2021-01-30 DIAGNOSIS — R339 Retention of urine, unspecified: Secondary | ICD-10-CM | POA: Diagnosis not present

## 2021-01-30 DIAGNOSIS — C61 Malignant neoplasm of prostate: Secondary | ICD-10-CM | POA: Diagnosis not present

## 2021-01-30 DIAGNOSIS — J90 Pleural effusion, not elsewhere classified: Secondary | ICD-10-CM | POA: Diagnosis not present

## 2021-02-01 DIAGNOSIS — R918 Other nonspecific abnormal finding of lung field: Secondary | ICD-10-CM | POA: Diagnosis not present

## 2021-02-01 DIAGNOSIS — I4891 Unspecified atrial fibrillation: Secondary | ICD-10-CM | POA: Diagnosis not present

## 2021-02-01 DIAGNOSIS — G8929 Other chronic pain: Secondary | ICD-10-CM | POA: Diagnosis not present

## 2021-02-01 DIAGNOSIS — J9601 Acute respiratory failure with hypoxia: Secondary | ICD-10-CM | POA: Diagnosis not present

## 2021-02-01 DIAGNOSIS — C61 Malignant neoplasm of prostate: Secondary | ICD-10-CM | POA: Diagnosis not present

## 2021-02-01 DIAGNOSIS — J90 Pleural effusion, not elsewhere classified: Secondary | ICD-10-CM | POA: Diagnosis not present

## 2021-02-01 DIAGNOSIS — D649 Anemia, unspecified: Secondary | ICD-10-CM | POA: Diagnosis not present

## 2021-02-01 DIAGNOSIS — L89133 Pressure ulcer of right lower back, stage 3: Secondary | ICD-10-CM | POA: Diagnosis not present

## 2021-02-01 DIAGNOSIS — I5043 Acute on chronic combined systolic (congestive) and diastolic (congestive) heart failure: Secondary | ICD-10-CM | POA: Diagnosis not present

## 2021-02-01 DIAGNOSIS — R339 Retention of urine, unspecified: Secondary | ICD-10-CM | POA: Diagnosis not present

## 2021-02-02 DIAGNOSIS — R338 Other retention of urine: Secondary | ICD-10-CM | POA: Diagnosis not present

## 2021-02-04 DIAGNOSIS — G894 Chronic pain syndrome: Secondary | ICD-10-CM | POA: Diagnosis not present

## 2021-02-04 DIAGNOSIS — I480 Paroxysmal atrial fibrillation: Secondary | ICD-10-CM | POA: Diagnosis not present

## 2021-02-04 DIAGNOSIS — R339 Retention of urine, unspecified: Secondary | ICD-10-CM | POA: Diagnosis not present

## 2021-02-04 DIAGNOSIS — C61 Malignant neoplasm of prostate: Secondary | ICD-10-CM | POA: Diagnosis not present

## 2021-02-04 DIAGNOSIS — E440 Moderate protein-calorie malnutrition: Secondary | ICD-10-CM | POA: Diagnosis not present

## 2021-02-04 DIAGNOSIS — R531 Weakness: Secondary | ICD-10-CM | POA: Diagnosis not present

## 2021-02-05 ENCOUNTER — Telehealth: Payer: Self-pay | Admitting: Hematology

## 2021-02-05 DIAGNOSIS — C61 Malignant neoplasm of prostate: Secondary | ICD-10-CM | POA: Diagnosis not present

## 2021-02-05 DIAGNOSIS — R531 Weakness: Secondary | ICD-10-CM | POA: Diagnosis not present

## 2021-02-05 DIAGNOSIS — I480 Paroxysmal atrial fibrillation: Secondary | ICD-10-CM | POA: Diagnosis not present

## 2021-02-05 DIAGNOSIS — G894 Chronic pain syndrome: Secondary | ICD-10-CM | POA: Diagnosis not present

## 2021-02-05 DIAGNOSIS — R339 Retention of urine, unspecified: Secondary | ICD-10-CM | POA: Diagnosis not present

## 2021-02-05 NOTE — Telephone Encounter (Signed)
Returned call to cancel upcoming appointments. Patient does not wish to reschedule yet due to being in and out of the hospital and wishes to feel better before coming back in. Patient's son stated he would call back to reschedule.

## 2021-02-07 ENCOUNTER — Other Ambulatory Visit: Payer: Medicare PPO

## 2021-02-08 DIAGNOSIS — L89133 Pressure ulcer of right lower back, stage 3: Secondary | ICD-10-CM | POA: Diagnosis not present

## 2021-02-09 ENCOUNTER — Ambulatory Visit: Payer: Medicare PPO | Admitting: Hematology

## 2021-02-09 ENCOUNTER — Other Ambulatory Visit: Payer: Self-pay

## 2021-02-09 ENCOUNTER — Encounter: Payer: Self-pay | Admitting: Pulmonary Disease

## 2021-02-09 ENCOUNTER — Ambulatory Visit: Payer: Medicare PPO | Admitting: Pulmonary Disease

## 2021-02-09 ENCOUNTER — Ambulatory Visit (INDEPENDENT_AMBULATORY_CARE_PROVIDER_SITE_OTHER): Payer: Medicare PPO

## 2021-02-09 VITALS — BP 98/70 | HR 98 | Ht 71.0 in | Wt 149.9 lb

## 2021-02-09 DIAGNOSIS — J90 Pleural effusion, not elsewhere classified: Secondary | ICD-10-CM

## 2021-02-09 DIAGNOSIS — R279 Unspecified lack of coordination: Secondary | ICD-10-CM | POA: Diagnosis not present

## 2021-02-09 DIAGNOSIS — R5381 Other malaise: Secondary | ICD-10-CM | POA: Diagnosis not present

## 2021-02-09 DIAGNOSIS — E46 Unspecified protein-calorie malnutrition: Secondary | ICD-10-CM | POA: Diagnosis not present

## 2021-02-09 DIAGNOSIS — J9811 Atelectasis: Secondary | ICD-10-CM | POA: Diagnosis not present

## 2021-02-09 DIAGNOSIS — G8929 Other chronic pain: Secondary | ICD-10-CM | POA: Diagnosis not present

## 2021-02-09 DIAGNOSIS — J939 Pneumothorax, unspecified: Secondary | ICD-10-CM | POA: Diagnosis not present

## 2021-02-09 DIAGNOSIS — Z743 Need for continuous supervision: Secondary | ICD-10-CM | POA: Diagnosis not present

## 2021-02-09 DIAGNOSIS — F0390 Unspecified dementia without behavioral disturbance: Secondary | ICD-10-CM | POA: Diagnosis not present

## 2021-02-09 DIAGNOSIS — R531 Weakness: Secondary | ICD-10-CM | POA: Diagnosis not present

## 2021-02-09 DIAGNOSIS — I480 Paroxysmal atrial fibrillation: Secondary | ICD-10-CM | POA: Diagnosis not present

## 2021-02-09 NOTE — Patient Instructions (Addendum)
Recommend that patient be provided with a ground diet  No other changes from a respiratory standpoint at this time.

## 2021-02-09 NOTE — Progress Notes (Signed)
Synopsis: Referred in March 2022 for lung mass and pleural effusion    Subjective:   PATIENT ID: Casey Gates. GENDER: male DOB: [birth year], MRN: [id number]   HPI  Chief Complaint  Patient presents with  . Hospitalization Follow-up    Casey Gates is a 87 year old male, never smoker with history of atrial fibrillation, chronic kidney disease and obstructive sleep apnea who is referred to pulmonary clinic for hospital follow up of loculated pleural effusion.  He was admitted 1/16 to 2/9 with hospital acquired pneumonia wit hassociated right sided loculated pleural effusion. A right chest tube was placed and he underwent intrapleural fibrinolytic therapy. A second chest tube was required for a right lateral superior loculation. He required ICU level of care during the hospitalization for shock related to sepsis and adrenal insufficiency. The apical chest tube was removed on 2/3 and the right chest tube was removed on 2/4. Chest radiograph on 2/5 showed stable right hydropneumothorax and an increasing airspace opacity at the right base along with a stable left pleural effusion.   He was discharged to a rehab facility at [facility]. He has been doing well there without changes to his breathing. He reports occasional cough, no much sputum production. Denies wheezing or dyspnea. He is able to sit up to eat but otherwise is bed ridden throughout the day. He is unable to stand due to generalized weakness. He is accompanied by his son today.   Past Medical History:  Diagnosis Date  . Anemia   . Asthma    AS A CHILD  . Blind left eye   . Cancer [facility])    PROSTATE CANCER  . Cellulitis    frequently, "all over"  . Chronic pain syndrome   . Chronic ulcer of toe, right, with unspecified severity ([place])    2nd toe  . Constipation   . DDD (degenerative disc disease), lumbar   . Gastrointestinal stromal tumor (GIST) of stomach ([place])   . GI bleeding   . History of blood transfusion     01/2018  . Insomnia   . Kyphoscoliosis   . Mass in the abdomen 01/2018  . Persistent atrial fibrillation ([place])   . Pneumonia   . Pneumothorax   . Restless leg   . Sleep apnea    does not wear CPAP  . Wears glasses      Family History  Problem Relation Age of Onset  . Bone cancer Mother   . Hypertension Mother   . Arthritis Mother   . Clotting disorder Daughter        died of blood clot      Social History   Socioeconomic History  . Marital status: Married    Spouse name: Not on file  . Number of children: 2  . Years of education: Not on file  . Highest education level: Not on file  Occupational History  . Not on file  Tobacco Use  . Smoking status: Never Smoker  . Smokeless tobacco: Never Used  Vaping Use  . Vaping Use: Never used  Substance and Sexual Activity  . Alcohol use: Not Currently    Comment: quit 1985, 2 fifths/ week,S/P rehab  . Drug use: Never  . Sexual activity: Not Currently  Other Topics Concern  . Not on file  Social History Narrative   ** Merged History Encounter **       Caffeine 2-3 cups average.  Widowed,  Lives at [facility].  Retired Forensic psychologist.  One son.   Social Determinants of Health   Financial Resource Strain: Not on file  Food Insecurity: Not on file  Transportation Needs: Not on file  Physical Activity: Not on file  Stress: Not on file  Social Connections: Not on file  Intimate Partner Violence: Not on file     Allergies  Allergen Reactions  . Coconut Flavor [Flavoring Agent] Hypertension    Anything that is related to coconut---LOSS OF CONSCIOUSNESS (SYNCOPE)  . Coconut Oil Other (See Comments)    Anything that is related to coconut--- LOSS OF CONSCIOUSNESS (SYNCOPE)  . Doxycycline Hyclate Hives  . Food Anaphylaxis    ALLERGIC TO ALL NUTS WITH THE EXCEPTION OF CASHEWS, ALMONDS  . Black [employer]  Other (See Comments)    Passes out ( can only eat almonds or peanuts)  . Coconut Oil Other (See  Comments)    Faints   . Doxycycline Rash     Outpatient Medications Prior to Visit  Medication Sig Dispense Refill  . apixaban (ELIQUIS) 2.5 MG TABS tablet Take 1 tablet (2.5 mg total) by mouth 2 (two) times daily. 60 tablet 3  . clotrimazole-betamethasone (LOTRISONE) cream Apply 1 application topically 2 (two) times daily. Aplpy to glans penis BID    . Digoxin 62.5 MCG TABS Take 0.0625 mg by mouth daily. (Patient taking differently: Take 0.0625 mg by mouth daily. Hold for pulse less than 60)    . feeding supplement (ENSURE ENLIVE / ENSURE PLUS) LIQD Take 237 mLs by mouth 2 (two) times daily between meals.    Marland Kitchen loperamide (IMODIUM) 2 MG capsule Take 1 capsule (2 mg total) by mouth daily as needed for diarrhea or loose stools.    . metoprolol tartrate 37.5 MG TABS Take 37.5 mg by mouth 2 (two) times daily.    . Multiple Vitamins-Minerals (CERTAVITE SENIOR PO) Take by mouth. 1 tp po daily for wound healing    . oxyCODONE (OXY IR/ROXICODONE) 5 MG immediate release tablet Take 1 tablet (5 mg total) by mouth every 12 (twelve) hours as needed for moderate pain or severe pain. 6 tablet 0  . Zinc Oxide (DESITIN) 13 % CREA Apply topically. Apply to buttocks daily    . amoxicillin-clavulanate (AUGMENTIN) 875-125 MG tablet Take 1 tablet by mouth every 12 (twelve) hours. 14-day course of antibiotics, through 01/16/2021.  PCCM recommending repeat CT chest on 01/16/2021 and if still residual disease/persistent signs of infection, if unresolved infiltrates will need an extra week of Augmentin. (Patient not taking: Reported on 02/09/2021)    . Maltodextrin-Xanthan Gum (RESOURCE THICKENUP CLEAR) POWD Oral, As needed, other (Patient not taking: Reported on 02/09/2021)    . White Petrolatum-Mineral Oil (ARTIFICIAL TEARS) ointment Place 1 application into the left eye 3 (three) times daily. Left lower eye lid (Patient not taking: Reported on 02/09/2021)     No facility-administered medications prior to visit.     Review of Systems  Constitutional: Negative for chills, fever, malaise/fatigue and weight loss.  HENT: Negative for congestion, sinus pain and sore throat.   Eyes: Negative.   Respiratory: Negative for cough, hemoptysis, sputum production, shortness of breath and wheezing.   Cardiovascular: Negative for chest pain, palpitations, orthopnea, claudication and leg swelling.  Gastrointestinal: Negative for abdominal pain, heartburn, nausea and vomiting.  Genitourinary: Negative.   Musculoskeletal: Negative for joint pain and myalgias.  Skin: Negative for rash.  Neurological: Positive for weakness.  Endo/Heme/Allergies: Negative.   Psychiatric/Behavioral: Negative.       Objective:  Vitals:   02/09/21 0950  BP: 98/70  Pulse: 98  SpO2: 97%  Height: 5\' 11"  (1.803 m)     Physical Exam Constitutional:      General: He is not in acute distress.    Appearance: He is ill-appearing (chronically ill).     Comments: Thin appearing  HENT:     Head: Normocephalic and atraumatic.     Nose: Nose normal.     Mouth/Throat:     Mouth: Mucous membranes are moist.     Pharynx: Oropharynx is clear.  Eyes:     Extraocular Movements: Extraocular movements intact.     Conjunctiva/sclera: Conjunctivae normal.     Pupils: Pupils are equal, round, and reactive to light.  Cardiovascular:     Rate and Rhythm: Normal rate and regular rhythm.     Pulses: Normal pulses.     Heart sounds: Normal heart sounds. No murmur heard.   Pulmonary:     Effort: Pulmonary effort is normal.     Breath sounds: Normal breath sounds. No wheezing, rhonchi or rales.  Abdominal:     General: Bowel sounds are normal.     Palpations: Abdomen is soft.  Musculoskeletal:     Right lower leg: No edema.     Left lower leg: No edema.  Lymphadenopathy:     Cervical: No cervical adenopathy.  Skin:    General: Skin is warm and dry.  Neurological:     General: No focal deficit present.     Mental Status: He is  alert.  Psychiatric:        Mood and Affect: Mood normal.        Behavior: Behavior normal.        Thought Content: Thought content normal.        Judgment: Judgment normal.     CBC    Component Value Date/Time   WBC 12.5 (H) 01/07/2021 0604   RBC 4.59 01/07/2021 0604   HGB 11.3 (L) 01/10/2021 0535   HGB 13.0 10/13/2020 0831   HCT 36.2 (L) 01/10/2021 0535   PLT 297 01/07/2021 0604   PLT 148 (L) 10/13/2020 0831   MCV 88.9 01/07/2021 0604   MCH 27.9 01/07/2021 0604   MCHC 31.4 01/07/2021 0604   RDW 15.5 01/07/2021 0604   LYMPHSABS 1.4 01/07/2021 0604   MONOABS 1.0 01/07/2021 0604   EOSABS 0.2 01/07/2021 0604   BASOSABS 0.0 01/07/2021 0604   BMP Latest Ref Rng & Units 01/10/2021 01/09/2021 01/08/2021  Glucose 70 - 99 mg/dL 90 84 79  BUN 8 - 23 mg/dL 13 18 19   Creatinine 0.61 - 1.24 mg/dL 0.59(L) 0.69 0.74  Sodium 135 - 145 mmol/L 138 137 139  Potassium 3.5 - 5.1 mmol/L 4.2 3.9 4.3  Chloride 98 - 111 mmol/L 100 99 101  CO2 22 - 32 mmol/L 31 31 28   Calcium 8.9 - 10.3 mg/dL 7.6(L) 7.5(L) 7.7(L)   Chest imaging: CXR 2/5 1. Stable right hydropneumothorax following removal of the right-sided chest tube. 2. Increasing airspace disease at the right base. This may represent atelectasis or inflammation associated with the previous chest tube. 3. Stable left pleural effusion without pneumothorax.  CT Chest wo contrast 01/04/21 1. Stable position of right-sided pigtail drainage catheter seen in right hemithorax. Significantly increased amount of air is noted in the upper lung zone consistent with increased right hydropneumothorax. Stable small right pleural effusion is noted. 2. Stable moderate left pleural effusion is noted with associated atelectasis or infiltrate of the  left lower lobe. 3. Stable right middle lobe and lower lobe opacities are noted concerning for atelectasis or infiltrates. 4. Interval development of subcutaneous emphysema involving the right posterior chest  wall. 5. Coronary artery calcifications are noted. 6. 4.3 cm ascending thoracic aortic aneurysm is noted. 7. Aortic atherosclerosis.  CT Chest 04/06/2020 Mediastinum/Nodes: No axillary supraclavicular adenopathy. No mediastinal hilar or adenopathy. No pericardial effusion. Esophagus normal.  Lungs/Pleura: Small calcified nodule in the RIGHT upper lobe. Bands of atelectasis and mild bronchiectasis in lower lobes. No interval Change.  PFT: No flowsheet data found.   Echo 12/30/20: 1. Left ventricular ejection fraction, by estimation, is 40 to 45%.  Difficult to determine systolic function as poor image quality and patient  is in Afib with RVR, but appears left ventricle has mildly decreased  function. Would consider repeat limited  echo with contrast once heart rate better controlled. Left ventricular  endocardial border not optimally defined to evaluate regional wall motion.  There is mild left ventricular hypertrophy. Left ventricular diastolic  parameters are indeterminate.  2. Right ventricular systolic function is mildly reduced. The right  ventricular size is mildly enlarged. There is normal pulmonary artery  systolic pressure. The estimated right ventricular systolic pressure is  85.8 mmHg.  3. Left atrial size was moderately dilated.  4. The mitral valve is degenerative. No evidence of mitral valve  regurgitation. Moderate mitral annular calcification.  5. The aortic valve was not well visualized. Aortic valve regurgitation  is trivial. No aortic stenosis is present.  6. Aortic dilatation noted. There is mild dilatation of the aortic root,  measuring 42 mm. There is mild dilatation of the ascending aorta,  measuring 41 mm.  7. The inferior vena cava is dilated in size with <50% respiratory  variability, suggesting right atrial pressure of 15 mmHg.     Assessment & Plan:   Pleural effusion on right - Plan: DG Chest 1 View  Discussion: Casey Gates is a 87  year old male, never smoker with history of atrial fibrillation, chronic kidney disease and obstructive sleep apnea who is referred to pulmonary clinic for hospital follow up of loculated pleural effusion.  He is status post 2 right sided chest tubes with TPA/DNAse therapy with improvement of his effusion. Repeat chest radiograph today shows improvement of the lung opacities from his hospitalization. There is elevation of the right hemidiaphragm and thickening of the right fissure.  Overall his respiratory status appears much improved since hospitalization.   We will have him follow up in 6 months.   Freda Jackson, MD [facility] Office: [phone]    Current Outpatient Medications:  .  apixaban (ELIQUIS) 2.5 MG TABS tablet, Take 1 tablet (2.5 mg total) by mouth 2 (two) times daily., Disp: 60 tablet, Rfl: 3 .  clotrimazole-betamethasone (LOTRISONE) cream, Apply 1 application topically 2 (two) times daily. Aplpy to glans penis BID, Disp: , Rfl:  .  Digoxin 62.5 MCG TABS, Take 0.0625 mg by mouth daily. (Patient taking differently: Take 0.0625 mg by mouth daily. Hold for pulse less than 60), Disp: , Rfl:  .  feeding supplement (ENSURE ENLIVE / ENSURE PLUS) LIQD, Take 237 mLs by mouth 2 (two) times daily between meals., Disp: , Rfl:  .  loperamide (IMODIUM) 2 MG capsule, Take 1 capsule (2 mg total) by mouth daily as needed for diarrhea or loose stools., Disp: , Rfl:  .  metoprolol tartrate 37.5 MG TABS, Take 37.5 mg by mouth 2 (two) times daily.,  Disp: , Rfl:  .  Multiple Vitamins-Minerals (CERTAVITE SENIOR PO), Take by mouth. 1 tp po daily for wound healing, Disp: , Rfl:  .  oxyCODONE (OXY IR/ROXICODONE) 5 MG immediate release tablet, Take 1 tablet (5 mg total) by mouth every 12 (twelve) hours as needed for moderate pain or severe pain., Disp: 6 tablet, Rfl: 0 .  Zinc Oxide (DESITIN) 13 % CREA, Apply topically. Apply to buttocks daily, Disp: , Rfl:  .   amoxicillin-clavulanate (AUGMENTIN) 875-125 MG tablet, Take 1 tablet by mouth every 12 (twelve) hours. 14-day course of antibiotics, through 01/16/2021.  PCCM recommending repeat CT chest on 01/16/2021 and if still residual disease/persistent signs of infection, if unresolved infiltrates will need an extra week of Augmentin. (Patient not taking: Reported on 02/09/2021), Disp: , Rfl:  .  Maltodextrin-Xanthan Gum (RESOURCE THICKENUP CLEAR) POWD, Oral, As needed, other (Patient not taking: Reported on 02/09/2021), Disp: , Rfl:  .  White Petrolatum-Mineral Oil (ARTIFICIAL TEARS) ointment, Place 1 application into the left eye 3 (three) times daily. Left lower eye lid (Patient not taking: Reported on 02/09/2021), Disp: , Rfl:

## 2021-02-10 DIAGNOSIS — I480 Paroxysmal atrial fibrillation: Secondary | ICD-10-CM | POA: Diagnosis not present

## 2021-02-10 DIAGNOSIS — C61 Malignant neoplasm of prostate: Secondary | ICD-10-CM | POA: Diagnosis not present

## 2021-02-10 DIAGNOSIS — E440 Moderate protein-calorie malnutrition: Secondary | ICD-10-CM | POA: Diagnosis not present

## 2021-02-10 DIAGNOSIS — R531 Weakness: Secondary | ICD-10-CM | POA: Diagnosis not present

## 2021-02-10 DIAGNOSIS — G894 Chronic pain syndrome: Secondary | ICD-10-CM | POA: Diagnosis not present

## 2021-02-10 DIAGNOSIS — R339 Retention of urine, unspecified: Secondary | ICD-10-CM | POA: Diagnosis not present

## 2021-02-11 NOTE — Progress Notes (Signed)
 Marland Kitchen

## 2021-02-12 ENCOUNTER — Other Ambulatory Visit: Payer: Self-pay

## 2021-02-12 ENCOUNTER — Encounter: Payer: Medicare PPO | Admitting: Interventional Cardiology

## 2021-02-12 VITALS — Ht 71.0 in

## 2021-02-12 DIAGNOSIS — I2510 Atherosclerotic heart disease of native coronary artery without angina pectoris: Secondary | ICD-10-CM

## 2021-02-12 DIAGNOSIS — I4819 Other persistent atrial fibrillation: Secondary | ICD-10-CM

## 2021-02-12 DIAGNOSIS — D6869 Other thrombophilia: Secondary | ICD-10-CM

## 2021-02-15 DIAGNOSIS — L89133 Pressure ulcer of right lower back, stage 3: Secondary | ICD-10-CM | POA: Diagnosis not present

## 2021-02-18 NOTE — Progress Notes (Signed)
Cardiology Office Note   Date:  02/19/2021   ID:  Casey Joslyn., DOB 1925-12-16, MRN [id number]  PCP:  Wenda Low, MD    No chief complaint on file.  AFib  Wt Readings from Last 3 Encounters:  02/09/21 149 lb 14.6 oz (68 kg)  01/06/21 151 lb 7.3 oz (68.7 kg)  10/13/20 129 lb 9.6 oz (58.8 kg)       History of Present Illness: Casey Krueger. is a 87 y.o. male   with a history of sleep apnea. In early August 2016, he began a 30 day test of his C Pap in which he was told that if he did not wear the CPAP for at least 4 hours a night, it would no longer be covered by insurance. This was stressful for him. In early August 2016, he developed some left-sided chest pain about an hour after eating. It was mild. There is no associated nausea or sweating. He went to the emergency room to be checked out. His workup was negative. After this, he was walking at his usual pace. There were no exertional symptoms. He denied any chest discomfort or shortness of breath. Overall, he felt at baseline.  A few weeks later in 2016, he had a similar episode of discomfort in the left side of his chest. He went back to the emergency room and again had a negative workup. He ruled out for MI. Echocardiogram showed no left ventricular dysfunction. He had mild LVH and evidence of diastolic dysfunction. There is mild AI. No significant structural heart abnormalities.    Since leaving the hospital, he has done well from a cardiac standpoint.  No chest pain.  He feels an abdominal fullness which she attributes to constipation.  He has been on chronic opioids for long-standing back pain.  His biggest complaint is back pain.  He comes to the office in 2019 with his son.  We discussed stress testing versus watchful waiting versus angiography for his chest pain.  Given his severe back pain and lack of cardiac symptoms, at that time we decided against any further cardiac workup.    Admitted in 01/2021 with  sepsis/pleural effusion. Records show: "87 y.o. 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 Hospitalization has been complicated by atrial fibrillation with RVR and hypotension. " Rate controlled.  Eliquis for anticoagulation.   Denies : Chest pain. Dizziness. Leg edema. Nitroglycerin use. Orthopnea. Palpitations. Paroxysmal nocturnal dyspnea. Syncope.   He has been weak. He is at a facility and son is trying to arrange PT. No bleeding.     Past Medical History:  Diagnosis Date  . Anemia   . Asthma    AS A CHILD  . Blind left eye   . Cancer [facility])    PROSTATE CANCER  . Cellulitis    frequently, "all over"  . Chronic pain syndrome   . Chronic ulcer of toe, right, with unspecified severity ([place])    2nd toe  . Constipation   . DDD (degenerative disc disease), lumbar   . Gastrointestinal stromal tumor (GIST) of stomach ([place])   . GI bleeding   . History of blood transfusion    01/2018  . Insomnia   . Kyphoscoliosis   . Mass in the abdomen 01/2018  . Persistent atrial fibrillation ([place])   .  Pneumonia   . Pneumothorax   . Restless leg   . Sleep apnea    does not wear CPAP  . Wears glasses     Past Surgical History:  Procedure Laterality Date  . AMPUTATION TOE Right 11/05/2018   Procedure: Right 2nd toe ampuation;  Surgeon: Wylene Simmer, MD;  Location: [place];  Service: Orthopedics;  Laterality: Right;  73min  . CHOLECYSTECTOMY    . COLONOSCOPY    . ESOPHAGOGASTRODUODENOSCOPY (EGD) WITH PROPOFOL Left 02/16/2018   Procedure: ESOPHAGOGASTRODUODENOSCOPY (EGD) WITH PROPOFOL;  Surgeon: [name], MD;  Location: [place];  Service: Gastroenterology;  Laterality: Left;  . LAPAROSCOPIC GASTRECTOMY N/A 03/12/2018   Procedure: LAPAROSCOPIC  ASSISTED PARTIAL GASTRECTOMY ERAS PATHWAY;  Surgeon: Stark Klein, MD;  Location: [place];  Service: General;  Laterality: N/A;  . LAPAROSCOPIC PARTIAL GASTRECTOMY  03/12/2018   LAPAROSCOPIC ASSISTED PARTIAL GASTRECTOMY ERAS PATHWAY  . PROSTATECTOMY    . QUADRICEPS TENDON REPAIR Left 07/07/2017   Procedure: REPAIR QUADRICEP TENDON;  Surgeon: Rod Can, MD;  Location: [place];  Service: Orthopedics;  Laterality: Left;  . RIGHT CATARACT EXTRACTION  2013  . RIGTH NEPHRECTOMY  2006  . TONSILLECTOMY    . TRANSURETHRAL RESECTION OF PROSTATE  1997     Current Outpatient Medications  Medication Sig Dispense Refill  . amoxicillin-clavulanate (AUGMENTIN) 875-125 MG tablet Take 1 tablet by mouth every 12 (twelve) hours. 14-day course of antibiotics, through 01/16/2021.  PCCM recommending repeat CT chest on 01/16/2021 and if still residual disease/persistent signs of infection, if unresolved infiltrates will need an extra week of Augmentin. (Patient taking differently: Take 1 tablet by mouth every 12 (twelve) hours. 14-day course of antibiotics, through 01/16/2021.  PCCM recommending repeat CT chest on 01/16/2021 and if still residual disease/persistent signs of infection, if unresolved infiltrates will need an extra week of Augmentin.)    . apixaban (ELIQUIS) 2.5 MG TABS tablet Take 1 tablet (2.5 mg total) by mouth 2 (two) times daily. 60 tablet 3  . clotrimazole-betamethasone (LOTRISONE) cream Apply 1 application topically 2 (two) times daily. Aplpy to glans penis BID    . Digoxin 62.5 MCG TABS Take 0.0625 mg by mouth daily. (Patient taking differently: Take 0.0625 mg by mouth daily. Hold for pulse less than 60)    . feeding supplement (ENSURE ENLIVE / ENSURE PLUS) LIQD Take 237 mLs by mouth 2 (two) times daily between meals.    Marland Kitchen levothyroxine (SYNTHROID) 25 MCG tablet Take 25 mcg by mouth daily before breakfast.    . loperamide (IMODIUM) 2 MG capsule Take 1 capsule (2 mg total) by mouth daily as needed for  diarrhea or loose stools.    . Maltodextrin-Xanthan Gum (RESOURCE THICKENUP CLEAR) POWD Oral, As needed, other    . metoprolol tartrate 37.5 MG TABS Take 37.5 mg by mouth 2 (two) times daily.    . Multiple Vitamins-Minerals (CERTAVITE SENIOR PO) Take by mouth. 1 tp po daily for wound healing    . oxyCODONE (OXY IR/ROXICODONE) 5 MG immediate release tablet Take 1 tablet (5 mg total) by mouth every 12 (twelve) hours as needed for moderate pain or severe pain. 6 tablet 0  . White Petrolatum-Mineral Oil (ARTIFICIAL TEARS) ointment Place 1 application into the left eye 3 (three) times daily. Left lower eye lid    . Zinc Oxide (DESITIN) 13 % CREA Apply topically. Apply to buttocks daily     No current facility-administered medications for this visit.    Allergies:   Coconut flavor [  flavoring agent], Coconut oil, Doxycycline hyclate, Food, Black walnut flavor, Coconut oil, and Doxycycline    Social History:  The patient  reports that he has never smoked. He has never used smokeless tobacco. He reports previous alcohol use. He reports that he does not use drugs.   Family History:  The patient's family history includes Arthritis in his mother; Bone cancer in his mother; Clotting disorder in his daughter; Hypertension in his mother.    ROS:  Please see the history of present illness.   Otherwise, review of systems are positive for generalized weakness.   All other systems are reviewed and negative.    PHYSICAL EXAM: VS:  BP (!) 94/46   Pulse 68   Ht 5\' 11"  (1.803 m)   SpO2 93%   BMI 20.91 kg/m  , BMI Body mass index is 20.91 kg/m. GEN: Well nourished, well developed, in no acute distress  HEENT: normal  Neck: no JVD, carotid bruits, or masses Cardiac: irregularly irregular; no murmurs, rubs, or gallops,no edema  Respiratory:  clear to auscultation bilaterally, normal work of breathing GI: soft, nontender, nondistended, + BS MS: no deformity or atrophy  Skin: warm and dry, no rash Neuro:   Strength and sensation are intact Psych: euthymic mood, full affect   EKG:   The ekg ordered in Feb 2022 demonstrates AFib with RVR   Recent Labs: 12/31/2020: ALT 40 01/01/2021: TSH 4.569 01/07/2021: Platelets 297 01/10/2021: BUN 13; Creatinine, Ser 0.59; Hemoglobin 11.3; Magnesium 2.0; Potassium 4.2; Sodium 138   Lipid Panel    Component Value Date/Time   CHOL 106 01/01/2021 0226   TRIG 60 01/01/2021 0226   HDL 22 (L) 01/01/2021 0226   CHOLHDL 4.8 01/01/2021 0226   VLDL 12 01/01/2021 0226   LDLCALC 72 01/01/2021 0226     Other studies Reviewed: Additional studies/ records that were reviewed today with results demonstrating: hospital records reviewed.   ASSESSMENT AND PLAN:  1. AFib: Rate controlled.  Check dig level.  COntinue metoprolol 37.5 mg daily.  If BP gets too low, will have to decrease to 25 mg daily.  2. Anticoagulated: No bleeding issues. Low dose Eliquis for stroke prevention.  3. Coronary calcification: No angina.  Try to obtain BP records from facility. 4. Discussed DNR status.  Son stated that patient has been DNR for some time now. THey are very specific about no chest compressions, no breathing tube and no feeding tubes.  We filled out a yellow sheet for him to have at home documenting the DNR.    Current medicines are reviewed at length with the patient today.  The patient concerns regarding his medicines were addressed.  The following changes have been made:  No change  Labs/ tests ordered today include:   Orders Placed This Encounter  Procedures  . Basic Metabolic Panel (BMET)  . Digoxin level    Recommend 150 minutes/week of aerobic exercise Low fat, low carb, high fiber diet recommended  Disposition:   FU in 6 months   Signed, Larae Grooms, MD  02/19/2021 12:42 PM    [facility] [address], [place], [place]  [address] Phone: [phone]; Fax: [phone]

## 2021-02-19 ENCOUNTER — Other Ambulatory Visit: Payer: Self-pay

## 2021-02-19 ENCOUNTER — Encounter: Payer: Self-pay | Admitting: Interventional Cardiology

## 2021-02-19 ENCOUNTER — Ambulatory Visit: Payer: Medicare PPO | Admitting: Interventional Cardiology

## 2021-02-19 VITALS — BP 94/46 | HR 68 | Ht 71.0 in

## 2021-02-19 DIAGNOSIS — I2584 Coronary atherosclerosis due to calcified coronary lesion: Secondary | ICD-10-CM | POA: Diagnosis not present

## 2021-02-19 DIAGNOSIS — R5381 Other malaise: Secondary | ICD-10-CM | POA: Diagnosis not present

## 2021-02-19 DIAGNOSIS — I2510 Atherosclerotic heart disease of native coronary artery without angina pectoris: Secondary | ICD-10-CM

## 2021-02-19 DIAGNOSIS — Z7401 Bed confinement status: Secondary | ICD-10-CM | POA: Diagnosis not present

## 2021-02-19 DIAGNOSIS — I4819 Other persistent atrial fibrillation: Secondary | ICD-10-CM | POA: Diagnosis not present

## 2021-02-19 DIAGNOSIS — D6869 Other thrombophilia: Secondary | ICD-10-CM

## 2021-02-19 DIAGNOSIS — M2550 Pain in unspecified joint: Secondary | ICD-10-CM | POA: Diagnosis not present

## 2021-02-19 NOTE — Patient Instructions (Addendum)
Medication Instructions:  Your physician recommends that you continue on your current medications as directed. Please refer to the Current Medication list given to you today.  *If you need a refill on your cardiac medications before your next appointment, please call your pharmacy*   Lab Work: Lab work to be done today--BMP and Digoxin If you have labs (blood work) drawn today and your tests are completely normal, you will receive your results only by: Marland Kitchen MyChart Message (if you have MyChart) OR . A paper copy in the mail If you have any lab test that is abnormal or we need to change your treatment, we will call you to review the results.   Testing/Procedures: none   Follow-Up: At [facility], you and your health needs are our priority.  As part of our continuing mission to provide you with exceptional heart care, we have created designated Provider Care Teams.  These Care Teams include your primary Cardiologist (physician) and Advanced Practice Providers (APPs -  Physician Assistants and Nurse Practitioners) who all work together to provide you with the care you need, when you need it.  We recommend signing up for the patient portal called "MyChart".  Sign up information is provided on this After Visit Summary.  MyChart is used to connect with patients for Virtual Visits (Telemedicine).  Patients are able to view lab/test results, encounter notes, upcoming appointments, etc.  Non-urgent messages can be sent to your provider as well.   To learn more about what you can do with MyChart, go to [email or web address].    Your next appointment:    08/27/21 at 10:20  The format for your next appointment:   In Person  Provider:   You may see Larae Grooms, MD or one of the following Advanced Practice Providers on your designated Care Team:    Melina Copa, PA-C  Ermalinda Barrios, PA-C    Other Instructions Please have blood pressure readings sent to our office.  Fax is  [phone]

## 2021-02-20 LAB — BASIC METABOLIC PANEL
BUN/Creatinine Ratio: 24 (ref 10–24)
BUN: 16 mg/dL (ref 10–36)
CO2: 24 mmol/L (ref 20–29)
Calcium: 8.5 mg/dL — ABNORMAL LOW (ref 8.6–10.2)
Chloride: 103 mmol/L (ref 96–106)
Creatinine, Ser: 0.68 mg/dL — ABNORMAL LOW (ref 0.76–1.27)
Glucose: 88 mg/dL (ref 65–99)
Potassium: 4.7 mmol/L (ref 3.5–5.2)
Sodium: 140 mmol/L (ref 134–144)
eGFR: 87 mL/min/{1.73_m2} (ref >59)

## 2021-02-20 LAB — DIGOXIN LEVEL: Digoxin, Serum: <0.4 ng/mL — ABNORMAL LOW (ref 0.5–0.9)

## 2021-02-21 ENCOUNTER — Telehealth: Payer: Self-pay | Admitting: Pulmonary Disease

## 2021-02-21 NOTE — Telephone Encounter (Signed)
Call made to patient son, confirmed patient DOB. Made aware of cxr results. Voiced understanding.   Nothing further needed at this time.

## 2021-02-21 NOTE — Telephone Encounter (Signed)
LMTCB  JD please advise of CXR results.   Thanks :)

## 2021-02-21 NOTE — Telephone Encounter (Signed)
The chest x-ray is overall improved. There are some patchy opacities in the right lung likely related to some aspiration. Otherwise, looks much better than when he was hospitalized.  Casey Gates

## 2021-02-22 DIAGNOSIS — L89133 Pressure ulcer of right lower back, stage 3: Secondary | ICD-10-CM | POA: Diagnosis not present

## 2021-02-24 DIAGNOSIS — G894 Chronic pain syndrome: Secondary | ICD-10-CM | POA: Diagnosis not present

## 2021-02-24 DIAGNOSIS — E440 Moderate protein-calorie malnutrition: Secondary | ICD-10-CM | POA: Diagnosis not present

## 2021-02-24 DIAGNOSIS — R339 Retention of urine, unspecified: Secondary | ICD-10-CM | POA: Diagnosis not present

## 2021-02-24 DIAGNOSIS — R531 Weakness: Secondary | ICD-10-CM | POA: Diagnosis not present

## 2021-02-24 DIAGNOSIS — C61 Malignant neoplasm of prostate: Secondary | ICD-10-CM | POA: Diagnosis not present

## 2021-02-28 DIAGNOSIS — C61 Malignant neoplasm of prostate: Secondary | ICD-10-CM | POA: Diagnosis not present

## 2021-02-28 DIAGNOSIS — R339 Retention of urine, unspecified: Secondary | ICD-10-CM | POA: Diagnosis not present

## 2021-02-28 DIAGNOSIS — R918 Other nonspecific abnormal finding of lung field: Secondary | ICD-10-CM | POA: Diagnosis not present

## 2021-02-28 DIAGNOSIS — D649 Anemia, unspecified: Secondary | ICD-10-CM | POA: Diagnosis not present

## 2021-02-28 DIAGNOSIS — I4891 Unspecified atrial fibrillation: Secondary | ICD-10-CM | POA: Diagnosis not present

## 2021-02-28 DIAGNOSIS — G8929 Other chronic pain: Secondary | ICD-10-CM | POA: Diagnosis not present

## 2021-02-28 DIAGNOSIS — J9601 Acute respiratory failure with hypoxia: Secondary | ICD-10-CM | POA: Diagnosis not present

## 2021-02-28 DIAGNOSIS — J90 Pleural effusion, not elsewhere classified: Secondary | ICD-10-CM | POA: Diagnosis not present

## 2021-02-28 DIAGNOSIS — I5043 Acute on chronic combined systolic (congestive) and diastolic (congestive) heart failure: Secondary | ICD-10-CM | POA: Diagnosis not present

## 2021-03-01 DIAGNOSIS — L89133 Pressure ulcer of right lower back, stage 3: Secondary | ICD-10-CM | POA: Diagnosis not present

## 2021-03-02 DIAGNOSIS — C61 Malignant neoplasm of prostate: Secondary | ICD-10-CM | POA: Diagnosis not present

## 2021-03-02 DIAGNOSIS — J9601 Acute respiratory failure with hypoxia: Secondary | ICD-10-CM | POA: Diagnosis not present

## 2021-03-02 DIAGNOSIS — J90 Pleural effusion, not elsewhere classified: Secondary | ICD-10-CM | POA: Diagnosis not present

## 2021-03-02 DIAGNOSIS — G8929 Other chronic pain: Secondary | ICD-10-CM | POA: Diagnosis not present

## 2021-03-02 DIAGNOSIS — R918 Other nonspecific abnormal finding of lung field: Secondary | ICD-10-CM | POA: Diagnosis not present

## 2021-03-02 DIAGNOSIS — R339 Retention of urine, unspecified: Secondary | ICD-10-CM | POA: Diagnosis not present

## 2021-03-02 DIAGNOSIS — D649 Anemia, unspecified: Secondary | ICD-10-CM | POA: Diagnosis not present

## 2021-03-02 DIAGNOSIS — I5043 Acute on chronic combined systolic (congestive) and diastolic (congestive) heart failure: Secondary | ICD-10-CM | POA: Diagnosis not present

## 2021-03-02 DIAGNOSIS — I4891 Unspecified atrial fibrillation: Secondary | ICD-10-CM | POA: Diagnosis not present

## 2021-03-03 DIAGNOSIS — J9601 Acute respiratory failure with hypoxia: Secondary | ICD-10-CM | POA: Diagnosis not present

## 2021-03-03 DIAGNOSIS — R339 Retention of urine, unspecified: Secondary | ICD-10-CM | POA: Diagnosis not present

## 2021-03-03 DIAGNOSIS — I5043 Acute on chronic combined systolic (congestive) and diastolic (congestive) heart failure: Secondary | ICD-10-CM | POA: Diagnosis not present

## 2021-03-03 DIAGNOSIS — D649 Anemia, unspecified: Secondary | ICD-10-CM | POA: Diagnosis not present

## 2021-03-03 DIAGNOSIS — C61 Malignant neoplasm of prostate: Secondary | ICD-10-CM | POA: Diagnosis not present

## 2021-03-03 DIAGNOSIS — R918 Other nonspecific abnormal finding of lung field: Secondary | ICD-10-CM | POA: Diagnosis not present

## 2021-03-03 DIAGNOSIS — G8929 Other chronic pain: Secondary | ICD-10-CM | POA: Diagnosis not present

## 2021-03-03 DIAGNOSIS — I4891 Unspecified atrial fibrillation: Secondary | ICD-10-CM | POA: Diagnosis not present

## 2021-03-03 DIAGNOSIS — J90 Pleural effusion, not elsewhere classified: Secondary | ICD-10-CM | POA: Diagnosis not present

## 2021-03-05 DIAGNOSIS — D649 Anemia, unspecified: Secondary | ICD-10-CM | POA: Diagnosis not present

## 2021-03-05 DIAGNOSIS — R339 Retention of urine, unspecified: Secondary | ICD-10-CM | POA: Diagnosis not present

## 2021-03-05 DIAGNOSIS — G8929 Other chronic pain: Secondary | ICD-10-CM | POA: Diagnosis not present

## 2021-03-05 DIAGNOSIS — R918 Other nonspecific abnormal finding of lung field: Secondary | ICD-10-CM | POA: Diagnosis not present

## 2021-03-05 DIAGNOSIS — J9601 Acute respiratory failure with hypoxia: Secondary | ICD-10-CM | POA: Diagnosis not present

## 2021-03-05 DIAGNOSIS — I4891 Unspecified atrial fibrillation: Secondary | ICD-10-CM | POA: Diagnosis not present

## 2021-03-05 DIAGNOSIS — I5043 Acute on chronic combined systolic (congestive) and diastolic (congestive) heart failure: Secondary | ICD-10-CM | POA: Diagnosis not present

## 2021-03-05 DIAGNOSIS — J90 Pleural effusion, not elsewhere classified: Secondary | ICD-10-CM | POA: Diagnosis not present

## 2021-03-05 DIAGNOSIS — C61 Malignant neoplasm of prostate: Secondary | ICD-10-CM | POA: Diagnosis not present

## 2021-03-06 DIAGNOSIS — I5043 Acute on chronic combined systolic (congestive) and diastolic (congestive) heart failure: Secondary | ICD-10-CM | POA: Diagnosis not present

## 2021-03-06 DIAGNOSIS — R339 Retention of urine, unspecified: Secondary | ICD-10-CM | POA: Diagnosis not present

## 2021-03-06 DIAGNOSIS — G8929 Other chronic pain: Secondary | ICD-10-CM | POA: Diagnosis not present

## 2021-03-06 DIAGNOSIS — J90 Pleural effusion, not elsewhere classified: Secondary | ICD-10-CM | POA: Diagnosis not present

## 2021-03-06 DIAGNOSIS — R918 Other nonspecific abnormal finding of lung field: Secondary | ICD-10-CM | POA: Diagnosis not present

## 2021-03-06 DIAGNOSIS — I4891 Unspecified atrial fibrillation: Secondary | ICD-10-CM | POA: Diagnosis not present

## 2021-03-06 DIAGNOSIS — Z5181 Encounter for therapeutic drug level monitoring: Secondary | ICD-10-CM | POA: Diagnosis not present

## 2021-03-06 DIAGNOSIS — T460X1A Poisoning by cardiac-stimulant glycosides and drugs of similar action, accidental (unintentional), initial encounter: Secondary | ICD-10-CM | POA: Diagnosis not present

## 2021-03-06 DIAGNOSIS — J9601 Acute respiratory failure with hypoxia: Secondary | ICD-10-CM | POA: Diagnosis not present

## 2021-03-06 DIAGNOSIS — E559 Vitamin D deficiency, unspecified: Secondary | ICD-10-CM | POA: Diagnosis not present

## 2021-03-06 DIAGNOSIS — C61 Malignant neoplasm of prostate: Secondary | ICD-10-CM | POA: Diagnosis not present

## 2021-03-06 DIAGNOSIS — D649 Anemia, unspecified: Secondary | ICD-10-CM | POA: Diagnosis not present

## 2021-03-07 DIAGNOSIS — J9601 Acute respiratory failure with hypoxia: Secondary | ICD-10-CM | POA: Diagnosis not present

## 2021-03-07 DIAGNOSIS — C61 Malignant neoplasm of prostate: Secondary | ICD-10-CM | POA: Diagnosis not present

## 2021-03-07 DIAGNOSIS — R339 Retention of urine, unspecified: Secondary | ICD-10-CM | POA: Diagnosis not present

## 2021-03-07 DIAGNOSIS — R918 Other nonspecific abnormal finding of lung field: Secondary | ICD-10-CM | POA: Diagnosis not present

## 2021-03-07 DIAGNOSIS — G8929 Other chronic pain: Secondary | ICD-10-CM | POA: Diagnosis not present

## 2021-03-07 DIAGNOSIS — J90 Pleural effusion, not elsewhere classified: Secondary | ICD-10-CM | POA: Diagnosis not present

## 2021-03-07 DIAGNOSIS — I5043 Acute on chronic combined systolic (congestive) and diastolic (congestive) heart failure: Secondary | ICD-10-CM | POA: Diagnosis not present

## 2021-03-07 DIAGNOSIS — I4891 Unspecified atrial fibrillation: Secondary | ICD-10-CM | POA: Diagnosis not present

## 2021-03-07 DIAGNOSIS — D649 Anemia, unspecified: Secondary | ICD-10-CM | POA: Diagnosis not present

## 2021-03-08 DIAGNOSIS — C61 Malignant neoplasm of prostate: Secondary | ICD-10-CM | POA: Diagnosis not present

## 2021-03-08 DIAGNOSIS — R339 Retention of urine, unspecified: Secondary | ICD-10-CM | POA: Diagnosis not present

## 2021-03-08 DIAGNOSIS — J90 Pleural effusion, not elsewhere classified: Secondary | ICD-10-CM | POA: Diagnosis not present

## 2021-03-08 DIAGNOSIS — D649 Anemia, unspecified: Secondary | ICD-10-CM | POA: Diagnosis not present

## 2021-03-08 DIAGNOSIS — R918 Other nonspecific abnormal finding of lung field: Secondary | ICD-10-CM | POA: Diagnosis not present

## 2021-03-08 DIAGNOSIS — G8929 Other chronic pain: Secondary | ICD-10-CM | POA: Diagnosis not present

## 2021-03-08 DIAGNOSIS — L89133 Pressure ulcer of right lower back, stage 3: Secondary | ICD-10-CM | POA: Diagnosis not present

## 2021-03-08 DIAGNOSIS — I5043 Acute on chronic combined systolic (congestive) and diastolic (congestive) heart failure: Secondary | ICD-10-CM | POA: Diagnosis not present

## 2021-03-08 DIAGNOSIS — J9601 Acute respiratory failure with hypoxia: Secondary | ICD-10-CM | POA: Diagnosis not present

## 2021-03-08 DIAGNOSIS — I4891 Unspecified atrial fibrillation: Secondary | ICD-10-CM | POA: Diagnosis not present

## 2021-03-10 DIAGNOSIS — G894 Chronic pain syndrome: Secondary | ICD-10-CM | POA: Diagnosis not present

## 2021-03-10 DIAGNOSIS — R531 Weakness: Secondary | ICD-10-CM | POA: Diagnosis not present

## 2021-03-10 DIAGNOSIS — R339 Retention of urine, unspecified: Secondary | ICD-10-CM | POA: Diagnosis not present

## 2021-03-10 DIAGNOSIS — I480 Paroxysmal atrial fibrillation: Secondary | ICD-10-CM | POA: Diagnosis not present

## 2021-03-10 DIAGNOSIS — E440 Moderate protein-calorie malnutrition: Secondary | ICD-10-CM | POA: Diagnosis not present

## 2021-03-12 DIAGNOSIS — I5043 Acute on chronic combined systolic (congestive) and diastolic (congestive) heart failure: Secondary | ICD-10-CM | POA: Diagnosis not present

## 2021-03-12 DIAGNOSIS — J90 Pleural effusion, not elsewhere classified: Secondary | ICD-10-CM | POA: Diagnosis not present

## 2021-03-12 DIAGNOSIS — R918 Other nonspecific abnormal finding of lung field: Secondary | ICD-10-CM | POA: Diagnosis not present

## 2021-03-12 DIAGNOSIS — G8929 Other chronic pain: Secondary | ICD-10-CM | POA: Diagnosis not present

## 2021-03-12 DIAGNOSIS — D649 Anemia, unspecified: Secondary | ICD-10-CM | POA: Diagnosis not present

## 2021-03-12 DIAGNOSIS — R339 Retention of urine, unspecified: Secondary | ICD-10-CM | POA: Diagnosis not present

## 2021-03-12 DIAGNOSIS — C61 Malignant neoplasm of prostate: Secondary | ICD-10-CM | POA: Diagnosis not present

## 2021-03-12 DIAGNOSIS — I4891 Unspecified atrial fibrillation: Secondary | ICD-10-CM | POA: Diagnosis not present

## 2021-03-12 DIAGNOSIS — J9601 Acute respiratory failure with hypoxia: Secondary | ICD-10-CM | POA: Diagnosis not present

## 2021-03-13 DIAGNOSIS — C61 Malignant neoplasm of prostate: Secondary | ICD-10-CM | POA: Diagnosis not present

## 2021-03-13 DIAGNOSIS — J90 Pleural effusion, not elsewhere classified: Secondary | ICD-10-CM | POA: Diagnosis not present

## 2021-03-13 DIAGNOSIS — G8929 Other chronic pain: Secondary | ICD-10-CM | POA: Diagnosis not present

## 2021-03-13 DIAGNOSIS — I5043 Acute on chronic combined systolic (congestive) and diastolic (congestive) heart failure: Secondary | ICD-10-CM | POA: Diagnosis not present

## 2021-03-13 DIAGNOSIS — R339 Retention of urine, unspecified: Secondary | ICD-10-CM | POA: Diagnosis not present

## 2021-03-13 DIAGNOSIS — I4891 Unspecified atrial fibrillation: Secondary | ICD-10-CM | POA: Diagnosis not present

## 2021-03-13 DIAGNOSIS — D649 Anemia, unspecified: Secondary | ICD-10-CM | POA: Diagnosis not present

## 2021-03-13 DIAGNOSIS — J9601 Acute respiratory failure with hypoxia: Secondary | ICD-10-CM | POA: Diagnosis not present

## 2021-03-13 DIAGNOSIS — R918 Other nonspecific abnormal finding of lung field: Secondary | ICD-10-CM | POA: Diagnosis not present

## 2021-03-14 DIAGNOSIS — K5900 Constipation, unspecified: Secondary | ICD-10-CM | POA: Diagnosis not present

## 2021-03-14 DIAGNOSIS — C61 Malignant neoplasm of prostate: Secondary | ICD-10-CM | POA: Diagnosis not present

## 2021-03-14 DIAGNOSIS — R339 Retention of urine, unspecified: Secondary | ICD-10-CM | POA: Diagnosis not present

## 2021-03-14 DIAGNOSIS — R531 Weakness: Secondary | ICD-10-CM | POA: Diagnosis not present

## 2021-03-14 DIAGNOSIS — G894 Chronic pain syndrome: Secondary | ICD-10-CM | POA: Diagnosis not present

## 2021-03-15 DIAGNOSIS — R946 Abnormal results of thyroid function studies: Secondary | ICD-10-CM | POA: Diagnosis not present

## 2021-03-15 DIAGNOSIS — R918 Other nonspecific abnormal finding of lung field: Secondary | ICD-10-CM | POA: Diagnosis not present

## 2021-03-15 DIAGNOSIS — I5043 Acute on chronic combined systolic (congestive) and diastolic (congestive) heart failure: Secondary | ICD-10-CM | POA: Diagnosis not present

## 2021-03-15 DIAGNOSIS — D649 Anemia, unspecified: Secondary | ICD-10-CM | POA: Diagnosis not present

## 2021-03-15 DIAGNOSIS — C61 Malignant neoplasm of prostate: Secondary | ICD-10-CM | POA: Diagnosis not present

## 2021-03-15 DIAGNOSIS — R339 Retention of urine, unspecified: Secondary | ICD-10-CM | POA: Diagnosis not present

## 2021-03-15 DIAGNOSIS — J90 Pleural effusion, not elsewhere classified: Secondary | ICD-10-CM | POA: Diagnosis not present

## 2021-03-15 DIAGNOSIS — I4891 Unspecified atrial fibrillation: Secondary | ICD-10-CM | POA: Diagnosis not present

## 2021-03-15 DIAGNOSIS — G8929 Other chronic pain: Secondary | ICD-10-CM | POA: Diagnosis not present

## 2021-03-15 DIAGNOSIS — J9601 Acute respiratory failure with hypoxia: Secondary | ICD-10-CM | POA: Diagnosis not present

## 2021-03-16 DIAGNOSIS — C61 Malignant neoplasm of prostate: Secondary | ICD-10-CM | POA: Diagnosis not present

## 2021-03-16 DIAGNOSIS — D649 Anemia, unspecified: Secondary | ICD-10-CM | POA: Diagnosis not present

## 2021-03-16 DIAGNOSIS — J9601 Acute respiratory failure with hypoxia: Secondary | ICD-10-CM | POA: Diagnosis not present

## 2021-03-16 DIAGNOSIS — R918 Other nonspecific abnormal finding of lung field: Secondary | ICD-10-CM | POA: Diagnosis not present

## 2021-03-16 DIAGNOSIS — I4891 Unspecified atrial fibrillation: Secondary | ICD-10-CM | POA: Diagnosis not present

## 2021-03-16 DIAGNOSIS — J90 Pleural effusion, not elsewhere classified: Secondary | ICD-10-CM | POA: Diagnosis not present

## 2021-03-16 DIAGNOSIS — G8929 Other chronic pain: Secondary | ICD-10-CM | POA: Diagnosis not present

## 2021-03-16 DIAGNOSIS — R339 Retention of urine, unspecified: Secondary | ICD-10-CM | POA: Diagnosis not present

## 2021-03-16 DIAGNOSIS — I5043 Acute on chronic combined systolic (congestive) and diastolic (congestive) heart failure: Secondary | ICD-10-CM | POA: Diagnosis not present

## 2021-03-19 DIAGNOSIS — C61 Malignant neoplasm of prostate: Secondary | ICD-10-CM | POA: Diagnosis not present

## 2021-03-19 DIAGNOSIS — D649 Anemia, unspecified: Secondary | ICD-10-CM | POA: Diagnosis not present

## 2021-03-19 DIAGNOSIS — J9601 Acute respiratory failure with hypoxia: Secondary | ICD-10-CM | POA: Diagnosis not present

## 2021-03-19 DIAGNOSIS — I5043 Acute on chronic combined systolic (congestive) and diastolic (congestive) heart failure: Secondary | ICD-10-CM | POA: Diagnosis not present

## 2021-03-19 DIAGNOSIS — R918 Other nonspecific abnormal finding of lung field: Secondary | ICD-10-CM | POA: Diagnosis not present

## 2021-03-19 DIAGNOSIS — I4891 Unspecified atrial fibrillation: Secondary | ICD-10-CM | POA: Diagnosis not present

## 2021-03-19 DIAGNOSIS — R339 Retention of urine, unspecified: Secondary | ICD-10-CM | POA: Diagnosis not present

## 2021-03-19 DIAGNOSIS — G8929 Other chronic pain: Secondary | ICD-10-CM | POA: Diagnosis not present

## 2021-03-19 DIAGNOSIS — J90 Pleural effusion, not elsewhere classified: Secondary | ICD-10-CM | POA: Diagnosis not present

## 2021-03-20 DIAGNOSIS — J9601 Acute respiratory failure with hypoxia: Secondary | ICD-10-CM | POA: Diagnosis not present

## 2021-03-20 DIAGNOSIS — R918 Other nonspecific abnormal finding of lung field: Secondary | ICD-10-CM | POA: Diagnosis not present

## 2021-03-20 DIAGNOSIS — I5043 Acute on chronic combined systolic (congestive) and diastolic (congestive) heart failure: Secondary | ICD-10-CM | POA: Diagnosis not present

## 2021-03-20 DIAGNOSIS — I4891 Unspecified atrial fibrillation: Secondary | ICD-10-CM | POA: Diagnosis not present

## 2021-03-20 DIAGNOSIS — G8929 Other chronic pain: Secondary | ICD-10-CM | POA: Diagnosis not present

## 2021-03-20 DIAGNOSIS — R339 Retention of urine, unspecified: Secondary | ICD-10-CM | POA: Diagnosis not present

## 2021-03-20 DIAGNOSIS — J90 Pleural effusion, not elsewhere classified: Secondary | ICD-10-CM | POA: Diagnosis not present

## 2021-03-20 DIAGNOSIS — D649 Anemia, unspecified: Secondary | ICD-10-CM | POA: Diagnosis not present

## 2021-03-20 DIAGNOSIS — C61 Malignant neoplasm of prostate: Secondary | ICD-10-CM | POA: Diagnosis not present

## 2021-03-22 DIAGNOSIS — R918 Other nonspecific abnormal finding of lung field: Secondary | ICD-10-CM | POA: Diagnosis not present

## 2021-03-22 DIAGNOSIS — J90 Pleural effusion, not elsewhere classified: Secondary | ICD-10-CM | POA: Diagnosis not present

## 2021-03-22 DIAGNOSIS — C61 Malignant neoplasm of prostate: Secondary | ICD-10-CM | POA: Diagnosis not present

## 2021-03-22 DIAGNOSIS — D649 Anemia, unspecified: Secondary | ICD-10-CM | POA: Diagnosis not present

## 2021-03-22 DIAGNOSIS — R339 Retention of urine, unspecified: Secondary | ICD-10-CM | POA: Diagnosis not present

## 2021-03-22 DIAGNOSIS — J9601 Acute respiratory failure with hypoxia: Secondary | ICD-10-CM | POA: Diagnosis not present

## 2021-03-22 DIAGNOSIS — I5043 Acute on chronic combined systolic (congestive) and diastolic (congestive) heart failure: Secondary | ICD-10-CM | POA: Diagnosis not present

## 2021-03-22 DIAGNOSIS — G8929 Other chronic pain: Secondary | ICD-10-CM | POA: Diagnosis not present

## 2021-03-22 DIAGNOSIS — I4891 Unspecified atrial fibrillation: Secondary | ICD-10-CM | POA: Diagnosis not present

## 2021-03-23 DIAGNOSIS — D649 Anemia, unspecified: Secondary | ICD-10-CM | POA: Diagnosis not present

## 2021-03-23 DIAGNOSIS — J9601 Acute respiratory failure with hypoxia: Secondary | ICD-10-CM | POA: Diagnosis not present

## 2021-03-23 DIAGNOSIS — R531 Weakness: Secondary | ICD-10-CM | POA: Diagnosis not present

## 2021-03-23 DIAGNOSIS — I5043 Acute on chronic combined systolic (congestive) and diastolic (congestive) heart failure: Secondary | ICD-10-CM | POA: Diagnosis not present

## 2021-03-23 DIAGNOSIS — I480 Paroxysmal atrial fibrillation: Secondary | ICD-10-CM | POA: Diagnosis not present

## 2021-03-23 DIAGNOSIS — G8929 Other chronic pain: Secondary | ICD-10-CM | POA: Diagnosis not present

## 2021-03-23 DIAGNOSIS — R339 Retention of urine, unspecified: Secondary | ICD-10-CM | POA: Diagnosis not present

## 2021-03-23 DIAGNOSIS — J90 Pleural effusion, not elsewhere classified: Secondary | ICD-10-CM | POA: Diagnosis not present

## 2021-03-23 DIAGNOSIS — R918 Other nonspecific abnormal finding of lung field: Secondary | ICD-10-CM | POA: Diagnosis not present

## 2021-03-23 DIAGNOSIS — E46 Unspecified protein-calorie malnutrition: Secondary | ICD-10-CM | POA: Diagnosis not present

## 2021-03-23 DIAGNOSIS — F0390 Unspecified dementia without behavioral disturbance: Secondary | ICD-10-CM | POA: Diagnosis not present

## 2021-03-23 DIAGNOSIS — C61 Malignant neoplasm of prostate: Secondary | ICD-10-CM | POA: Diagnosis not present

## 2021-03-23 DIAGNOSIS — I4891 Unspecified atrial fibrillation: Secondary | ICD-10-CM | POA: Diagnosis not present

## 2021-03-24 DIAGNOSIS — R0602 Shortness of breath: Secondary | ICD-10-CM | POA: Diagnosis not present

## 2021-03-26 DIAGNOSIS — I5043 Acute on chronic combined systolic (congestive) and diastolic (congestive) heart failure: Secondary | ICD-10-CM | POA: Diagnosis not present

## 2021-03-26 DIAGNOSIS — I4891 Unspecified atrial fibrillation: Secondary | ICD-10-CM | POA: Diagnosis not present

## 2021-03-26 DIAGNOSIS — D649 Anemia, unspecified: Secondary | ICD-10-CM | POA: Diagnosis not present

## 2021-03-26 DIAGNOSIS — C61 Malignant neoplasm of prostate: Secondary | ICD-10-CM | POA: Diagnosis not present

## 2021-03-26 DIAGNOSIS — G8929 Other chronic pain: Secondary | ICD-10-CM | POA: Diagnosis not present

## 2021-03-26 DIAGNOSIS — J90 Pleural effusion, not elsewhere classified: Secondary | ICD-10-CM | POA: Diagnosis not present

## 2021-03-26 DIAGNOSIS — R339 Retention of urine, unspecified: Secondary | ICD-10-CM | POA: Diagnosis not present

## 2021-03-26 DIAGNOSIS — R918 Other nonspecific abnormal finding of lung field: Secondary | ICD-10-CM | POA: Diagnosis not present

## 2021-03-26 DIAGNOSIS — J9601 Acute respiratory failure with hypoxia: Secondary | ICD-10-CM | POA: Diagnosis not present

## 2021-03-27 DIAGNOSIS — I5043 Acute on chronic combined systolic (congestive) and diastolic (congestive) heart failure: Secondary | ICD-10-CM | POA: Diagnosis not present

## 2021-03-27 DIAGNOSIS — R339 Retention of urine, unspecified: Secondary | ICD-10-CM | POA: Diagnosis not present

## 2021-03-27 DIAGNOSIS — I4891 Unspecified atrial fibrillation: Secondary | ICD-10-CM | POA: Diagnosis not present

## 2021-03-27 DIAGNOSIS — G8929 Other chronic pain: Secondary | ICD-10-CM | POA: Diagnosis not present

## 2021-03-27 DIAGNOSIS — J90 Pleural effusion, not elsewhere classified: Secondary | ICD-10-CM | POA: Diagnosis not present

## 2021-03-27 DIAGNOSIS — J9601 Acute respiratory failure with hypoxia: Secondary | ICD-10-CM | POA: Diagnosis not present

## 2021-03-27 DIAGNOSIS — D649 Anemia, unspecified: Secondary | ICD-10-CM | POA: Diagnosis not present

## 2021-03-27 DIAGNOSIS — C61 Malignant neoplasm of prostate: Secondary | ICD-10-CM | POA: Diagnosis not present

## 2021-03-27 DIAGNOSIS — R918 Other nonspecific abnormal finding of lung field: Secondary | ICD-10-CM | POA: Diagnosis not present

## 2021-03-28 ENCOUNTER — Ambulatory Visit: Payer: Medicare PPO | Admitting: Podiatry

## 2021-03-28 DIAGNOSIS — G8929 Other chronic pain: Secondary | ICD-10-CM | POA: Diagnosis not present

## 2021-03-28 DIAGNOSIS — J9601 Acute respiratory failure with hypoxia: Secondary | ICD-10-CM | POA: Diagnosis not present

## 2021-03-28 DIAGNOSIS — I5043 Acute on chronic combined systolic (congestive) and diastolic (congestive) heart failure: Secondary | ICD-10-CM | POA: Diagnosis not present

## 2021-03-28 DIAGNOSIS — I4891 Unspecified atrial fibrillation: Secondary | ICD-10-CM | POA: Diagnosis not present

## 2021-03-28 DIAGNOSIS — J90 Pleural effusion, not elsewhere classified: Secondary | ICD-10-CM | POA: Diagnosis not present

## 2021-03-28 DIAGNOSIS — R918 Other nonspecific abnormal finding of lung field: Secondary | ICD-10-CM | POA: Diagnosis not present

## 2021-03-28 DIAGNOSIS — D649 Anemia, unspecified: Secondary | ICD-10-CM | POA: Diagnosis not present

## 2021-03-28 DIAGNOSIS — R339 Retention of urine, unspecified: Secondary | ICD-10-CM | POA: Diagnosis not present

## 2021-03-28 DIAGNOSIS — C61 Malignant neoplasm of prostate: Secondary | ICD-10-CM | POA: Diagnosis not present

## 2021-03-29 DIAGNOSIS — I5043 Acute on chronic combined systolic (congestive) and diastolic (congestive) heart failure: Secondary | ICD-10-CM | POA: Diagnosis not present

## 2021-03-29 DIAGNOSIS — R339 Retention of urine, unspecified: Secondary | ICD-10-CM | POA: Diagnosis not present

## 2021-03-29 DIAGNOSIS — R918 Other nonspecific abnormal finding of lung field: Secondary | ICD-10-CM | POA: Diagnosis not present

## 2021-03-29 DIAGNOSIS — D649 Anemia, unspecified: Secondary | ICD-10-CM | POA: Diagnosis not present

## 2021-03-29 DIAGNOSIS — C61 Malignant neoplasm of prostate: Secondary | ICD-10-CM | POA: Diagnosis not present

## 2021-03-29 DIAGNOSIS — I4891 Unspecified atrial fibrillation: Secondary | ICD-10-CM | POA: Diagnosis not present

## 2021-03-29 DIAGNOSIS — J90 Pleural effusion, not elsewhere classified: Secondary | ICD-10-CM | POA: Diagnosis not present

## 2021-03-29 DIAGNOSIS — J9601 Acute respiratory failure with hypoxia: Secondary | ICD-10-CM | POA: Diagnosis not present

## 2021-03-29 DIAGNOSIS — G8929 Other chronic pain: Secondary | ICD-10-CM | POA: Diagnosis not present

## 2021-03-30 DIAGNOSIS — J90 Pleural effusion, not elsewhere classified: Secondary | ICD-10-CM | POA: Diagnosis not present

## 2021-03-30 DIAGNOSIS — J9601 Acute respiratory failure with hypoxia: Secondary | ICD-10-CM | POA: Diagnosis not present

## 2021-03-30 DIAGNOSIS — I5043 Acute on chronic combined systolic (congestive) and diastolic (congestive) heart failure: Secondary | ICD-10-CM | POA: Diagnosis not present

## 2021-03-30 DIAGNOSIS — R918 Other nonspecific abnormal finding of lung field: Secondary | ICD-10-CM | POA: Diagnosis not present

## 2021-03-30 DIAGNOSIS — D649 Anemia, unspecified: Secondary | ICD-10-CM | POA: Diagnosis not present

## 2021-03-30 DIAGNOSIS — R339 Retention of urine, unspecified: Secondary | ICD-10-CM | POA: Diagnosis not present

## 2021-03-30 DIAGNOSIS — C61 Malignant neoplasm of prostate: Secondary | ICD-10-CM | POA: Diagnosis not present

## 2021-03-30 DIAGNOSIS — G8929 Other chronic pain: Secondary | ICD-10-CM | POA: Diagnosis not present

## 2021-03-30 DIAGNOSIS — I4891 Unspecified atrial fibrillation: Secondary | ICD-10-CM | POA: Diagnosis not present

## 2021-04-02 DIAGNOSIS — C61 Malignant neoplasm of prostate: Secondary | ICD-10-CM | POA: Diagnosis not present

## 2021-04-02 DIAGNOSIS — G8929 Other chronic pain: Secondary | ICD-10-CM | POA: Diagnosis not present

## 2021-04-02 DIAGNOSIS — J9601 Acute respiratory failure with hypoxia: Secondary | ICD-10-CM | POA: Diagnosis not present

## 2021-04-02 DIAGNOSIS — I5043 Acute on chronic combined systolic (congestive) and diastolic (congestive) heart failure: Secondary | ICD-10-CM | POA: Diagnosis not present

## 2021-04-02 DIAGNOSIS — I4891 Unspecified atrial fibrillation: Secondary | ICD-10-CM | POA: Diagnosis not present

## 2021-04-02 DIAGNOSIS — R339 Retention of urine, unspecified: Secondary | ICD-10-CM | POA: Diagnosis not present

## 2021-04-02 DIAGNOSIS — R918 Other nonspecific abnormal finding of lung field: Secondary | ICD-10-CM | POA: Diagnosis not present

## 2021-04-02 DIAGNOSIS — D649 Anemia, unspecified: Secondary | ICD-10-CM | POA: Diagnosis not present

## 2021-04-02 DIAGNOSIS — J90 Pleural effusion, not elsewhere classified: Secondary | ICD-10-CM | POA: Diagnosis not present

## 2021-04-03 DIAGNOSIS — R918 Other nonspecific abnormal finding of lung field: Secondary | ICD-10-CM | POA: Diagnosis not present

## 2021-04-03 DIAGNOSIS — D649 Anemia, unspecified: Secondary | ICD-10-CM | POA: Diagnosis not present

## 2021-04-03 DIAGNOSIS — C61 Malignant neoplasm of prostate: Secondary | ICD-10-CM | POA: Diagnosis not present

## 2021-04-03 DIAGNOSIS — I4891 Unspecified atrial fibrillation: Secondary | ICD-10-CM | POA: Diagnosis not present

## 2021-04-03 DIAGNOSIS — J9601 Acute respiratory failure with hypoxia: Secondary | ICD-10-CM | POA: Diagnosis not present

## 2021-04-03 DIAGNOSIS — I5043 Acute on chronic combined systolic (congestive) and diastolic (congestive) heart failure: Secondary | ICD-10-CM | POA: Diagnosis not present

## 2021-04-03 DIAGNOSIS — J90 Pleural effusion, not elsewhere classified: Secondary | ICD-10-CM | POA: Diagnosis not present

## 2021-04-03 DIAGNOSIS — R339 Retention of urine, unspecified: Secondary | ICD-10-CM | POA: Diagnosis not present

## 2021-04-03 DIAGNOSIS — G8929 Other chronic pain: Secondary | ICD-10-CM | POA: Diagnosis not present

## 2021-04-04 DIAGNOSIS — I4891 Unspecified atrial fibrillation: Secondary | ICD-10-CM | POA: Diagnosis not present

## 2021-04-04 DIAGNOSIS — G8929 Other chronic pain: Secondary | ICD-10-CM | POA: Diagnosis not present

## 2021-04-04 DIAGNOSIS — J9601 Acute respiratory failure with hypoxia: Secondary | ICD-10-CM | POA: Diagnosis not present

## 2021-04-04 DIAGNOSIS — D649 Anemia, unspecified: Secondary | ICD-10-CM | POA: Diagnosis not present

## 2021-04-04 DIAGNOSIS — R531 Weakness: Secondary | ICD-10-CM | POA: Diagnosis not present

## 2021-04-04 DIAGNOSIS — Z7901 Long term (current) use of anticoagulants: Secondary | ICD-10-CM | POA: Diagnosis not present

## 2021-04-04 DIAGNOSIS — I480 Paroxysmal atrial fibrillation: Secondary | ICD-10-CM | POA: Diagnosis not present

## 2021-04-04 DIAGNOSIS — J90 Pleural effusion, not elsewhere classified: Secondary | ICD-10-CM | POA: Diagnosis not present

## 2021-04-04 DIAGNOSIS — G894 Chronic pain syndrome: Secondary | ICD-10-CM | POA: Diagnosis not present

## 2021-04-04 DIAGNOSIS — I5043 Acute on chronic combined systolic (congestive) and diastolic (congestive) heart failure: Secondary | ICD-10-CM | POA: Diagnosis not present

## 2021-04-04 DIAGNOSIS — R339 Retention of urine, unspecified: Secondary | ICD-10-CM | POA: Diagnosis not present

## 2021-04-04 DIAGNOSIS — C61 Malignant neoplasm of prostate: Secondary | ICD-10-CM | POA: Diagnosis not present

## 2021-04-04 DIAGNOSIS — R918 Other nonspecific abnormal finding of lung field: Secondary | ICD-10-CM | POA: Diagnosis not present

## 2021-04-05 DIAGNOSIS — J90 Pleural effusion, not elsewhere classified: Secondary | ICD-10-CM | POA: Diagnosis not present

## 2021-04-05 DIAGNOSIS — I5043 Acute on chronic combined systolic (congestive) and diastolic (congestive) heart failure: Secondary | ICD-10-CM | POA: Diagnosis not present

## 2021-04-05 DIAGNOSIS — J9601 Acute respiratory failure with hypoxia: Secondary | ICD-10-CM | POA: Diagnosis not present

## 2021-04-05 DIAGNOSIS — R918 Other nonspecific abnormal finding of lung field: Secondary | ICD-10-CM | POA: Diagnosis not present

## 2021-04-05 DIAGNOSIS — C61 Malignant neoplasm of prostate: Secondary | ICD-10-CM | POA: Diagnosis not present

## 2021-04-05 DIAGNOSIS — G8929 Other chronic pain: Secondary | ICD-10-CM | POA: Diagnosis not present

## 2021-04-05 DIAGNOSIS — I4891 Unspecified atrial fibrillation: Secondary | ICD-10-CM | POA: Diagnosis not present

## 2021-04-05 DIAGNOSIS — D649 Anemia, unspecified: Secondary | ICD-10-CM | POA: Diagnosis not present

## 2021-04-05 DIAGNOSIS — R339 Retention of urine, unspecified: Secondary | ICD-10-CM | POA: Diagnosis not present

## 2021-04-06 DIAGNOSIS — I5043 Acute on chronic combined systolic (congestive) and diastolic (congestive) heart failure: Secondary | ICD-10-CM | POA: Diagnosis not present

## 2021-04-06 DIAGNOSIS — D649 Anemia, unspecified: Secondary | ICD-10-CM | POA: Diagnosis not present

## 2021-04-06 DIAGNOSIS — J90 Pleural effusion, not elsewhere classified: Secondary | ICD-10-CM | POA: Diagnosis not present

## 2021-04-06 DIAGNOSIS — R918 Other nonspecific abnormal finding of lung field: Secondary | ICD-10-CM | POA: Diagnosis not present

## 2021-04-06 DIAGNOSIS — C61 Malignant neoplasm of prostate: Secondary | ICD-10-CM | POA: Diagnosis not present

## 2021-04-06 DIAGNOSIS — J9601 Acute respiratory failure with hypoxia: Secondary | ICD-10-CM | POA: Diagnosis not present

## 2021-04-06 DIAGNOSIS — R339 Retention of urine, unspecified: Secondary | ICD-10-CM | POA: Diagnosis not present

## 2021-04-06 DIAGNOSIS — G8929 Other chronic pain: Secondary | ICD-10-CM | POA: Diagnosis not present

## 2021-04-06 DIAGNOSIS — I4891 Unspecified atrial fibrillation: Secondary | ICD-10-CM | POA: Diagnosis not present

## 2021-04-09 DIAGNOSIS — I4891 Unspecified atrial fibrillation: Secondary | ICD-10-CM | POA: Diagnosis not present

## 2021-04-09 DIAGNOSIS — J90 Pleural effusion, not elsewhere classified: Secondary | ICD-10-CM | POA: Diagnosis not present

## 2021-04-09 DIAGNOSIS — I5043 Acute on chronic combined systolic (congestive) and diastolic (congestive) heart failure: Secondary | ICD-10-CM | POA: Diagnosis not present

## 2021-04-09 DIAGNOSIS — J9601 Acute respiratory failure with hypoxia: Secondary | ICD-10-CM | POA: Diagnosis not present

## 2021-04-09 DIAGNOSIS — D649 Anemia, unspecified: Secondary | ICD-10-CM | POA: Diagnosis not present

## 2021-04-09 DIAGNOSIS — G8929 Other chronic pain: Secondary | ICD-10-CM | POA: Diagnosis not present

## 2021-04-09 DIAGNOSIS — R339 Retention of urine, unspecified: Secondary | ICD-10-CM | POA: Diagnosis not present

## 2021-04-09 DIAGNOSIS — C61 Malignant neoplasm of prostate: Secondary | ICD-10-CM | POA: Diagnosis not present

## 2021-04-09 DIAGNOSIS — R918 Other nonspecific abnormal finding of lung field: Secondary | ICD-10-CM | POA: Diagnosis not present

## 2021-04-10 DIAGNOSIS — C61 Malignant neoplasm of prostate: Secondary | ICD-10-CM | POA: Diagnosis not present

## 2021-04-10 DIAGNOSIS — I5043 Acute on chronic combined systolic (congestive) and diastolic (congestive) heart failure: Secondary | ICD-10-CM | POA: Diagnosis not present

## 2021-04-10 DIAGNOSIS — I4891 Unspecified atrial fibrillation: Secondary | ICD-10-CM | POA: Diagnosis not present

## 2021-04-10 DIAGNOSIS — J9601 Acute respiratory failure with hypoxia: Secondary | ICD-10-CM | POA: Diagnosis not present

## 2021-04-10 DIAGNOSIS — R918 Other nonspecific abnormal finding of lung field: Secondary | ICD-10-CM | POA: Diagnosis not present

## 2021-04-10 DIAGNOSIS — J90 Pleural effusion, not elsewhere classified: Secondary | ICD-10-CM | POA: Diagnosis not present

## 2021-04-10 DIAGNOSIS — D649 Anemia, unspecified: Secondary | ICD-10-CM | POA: Diagnosis not present

## 2021-04-10 DIAGNOSIS — G8929 Other chronic pain: Secondary | ICD-10-CM | POA: Diagnosis not present

## 2021-04-10 DIAGNOSIS — R339 Retention of urine, unspecified: Secondary | ICD-10-CM | POA: Diagnosis not present

## 2021-04-11 DIAGNOSIS — J9601 Acute respiratory failure with hypoxia: Secondary | ICD-10-CM | POA: Diagnosis not present

## 2021-04-11 DIAGNOSIS — R339 Retention of urine, unspecified: Secondary | ICD-10-CM | POA: Diagnosis not present

## 2021-04-11 DIAGNOSIS — J90 Pleural effusion, not elsewhere classified: Secondary | ICD-10-CM | POA: Diagnosis not present

## 2021-04-11 DIAGNOSIS — C61 Malignant neoplasm of prostate: Secondary | ICD-10-CM | POA: Diagnosis not present

## 2021-04-11 DIAGNOSIS — R918 Other nonspecific abnormal finding of lung field: Secondary | ICD-10-CM | POA: Diagnosis not present

## 2021-04-11 DIAGNOSIS — G8929 Other chronic pain: Secondary | ICD-10-CM | POA: Diagnosis not present

## 2021-04-11 DIAGNOSIS — I5043 Acute on chronic combined systolic (congestive) and diastolic (congestive) heart failure: Secondary | ICD-10-CM | POA: Diagnosis not present

## 2021-04-11 DIAGNOSIS — D649 Anemia, unspecified: Secondary | ICD-10-CM | POA: Diagnosis not present

## 2021-04-11 DIAGNOSIS — I4891 Unspecified atrial fibrillation: Secondary | ICD-10-CM | POA: Diagnosis not present

## 2021-04-12 DIAGNOSIS — J9601 Acute respiratory failure with hypoxia: Secondary | ICD-10-CM | POA: Diagnosis not present

## 2021-04-12 DIAGNOSIS — J90 Pleural effusion, not elsewhere classified: Secondary | ICD-10-CM | POA: Diagnosis not present

## 2021-04-12 DIAGNOSIS — I5043 Acute on chronic combined systolic (congestive) and diastolic (congestive) heart failure: Secondary | ICD-10-CM | POA: Diagnosis not present

## 2021-04-12 DIAGNOSIS — R339 Retention of urine, unspecified: Secondary | ICD-10-CM | POA: Diagnosis not present

## 2021-04-12 DIAGNOSIS — G8929 Other chronic pain: Secondary | ICD-10-CM | POA: Diagnosis not present

## 2021-04-12 DIAGNOSIS — D649 Anemia, unspecified: Secondary | ICD-10-CM | POA: Diagnosis not present

## 2021-04-12 DIAGNOSIS — I4891 Unspecified atrial fibrillation: Secondary | ICD-10-CM | POA: Diagnosis not present

## 2021-04-12 DIAGNOSIS — C61 Malignant neoplasm of prostate: Secondary | ICD-10-CM | POA: Diagnosis not present

## 2021-04-12 DIAGNOSIS — R918 Other nonspecific abnormal finding of lung field: Secondary | ICD-10-CM | POA: Diagnosis not present

## 2021-04-13 DIAGNOSIS — R918 Other nonspecific abnormal finding of lung field: Secondary | ICD-10-CM | POA: Diagnosis not present

## 2021-04-13 DIAGNOSIS — R531 Weakness: Secondary | ICD-10-CM | POA: Diagnosis not present

## 2021-04-13 DIAGNOSIS — I480 Paroxysmal atrial fibrillation: Secondary | ICD-10-CM | POA: Diagnosis not present

## 2021-04-13 DIAGNOSIS — I4891 Unspecified atrial fibrillation: Secondary | ICD-10-CM | POA: Diagnosis not present

## 2021-04-13 DIAGNOSIS — F0390 Unspecified dementia without behavioral disturbance: Secondary | ICD-10-CM | POA: Diagnosis not present

## 2021-04-13 DIAGNOSIS — J90 Pleural effusion, not elsewhere classified: Secondary | ICD-10-CM | POA: Diagnosis not present

## 2021-04-13 DIAGNOSIS — I5043 Acute on chronic combined systolic (congestive) and diastolic (congestive) heart failure: Secondary | ICD-10-CM | POA: Diagnosis not present

## 2021-04-13 DIAGNOSIS — G8929 Other chronic pain: Secondary | ICD-10-CM | POA: Diagnosis not present

## 2021-04-13 DIAGNOSIS — D649 Anemia, unspecified: Secondary | ICD-10-CM | POA: Diagnosis not present

## 2021-04-13 DIAGNOSIS — R339 Retention of urine, unspecified: Secondary | ICD-10-CM | POA: Diagnosis not present

## 2021-04-13 DIAGNOSIS — J9601 Acute respiratory failure with hypoxia: Secondary | ICD-10-CM | POA: Diagnosis not present

## 2021-04-13 DIAGNOSIS — E46 Unspecified protein-calorie malnutrition: Secondary | ICD-10-CM | POA: Diagnosis not present

## 2021-04-13 DIAGNOSIS — C61 Malignant neoplasm of prostate: Secondary | ICD-10-CM | POA: Diagnosis not present

## 2021-04-19 DIAGNOSIS — Z13220 Encounter for screening for lipoid disorders: Secondary | ICD-10-CM | POA: Diagnosis not present

## 2021-04-19 DIAGNOSIS — R946 Abnormal results of thyroid function studies: Secondary | ICD-10-CM | POA: Diagnosis not present

## 2021-04-19 DIAGNOSIS — Z13228 Encounter for screening for other metabolic disorders: Secondary | ICD-10-CM | POA: Diagnosis not present

## 2021-04-20 DIAGNOSIS — Z7901 Long term (current) use of anticoagulants: Secondary | ICD-10-CM | POA: Diagnosis not present

## 2021-04-20 DIAGNOSIS — R531 Weakness: Secondary | ICD-10-CM | POA: Diagnosis not present

## 2021-04-20 DIAGNOSIS — I480 Paroxysmal atrial fibrillation: Secondary | ICD-10-CM | POA: Diagnosis not present

## 2021-04-20 DIAGNOSIS — G894 Chronic pain syndrome: Secondary | ICD-10-CM | POA: Diagnosis not present

## 2021-04-20 DIAGNOSIS — R339 Retention of urine, unspecified: Secondary | ICD-10-CM | POA: Diagnosis not present

## 2021-04-23 DIAGNOSIS — N390 Urinary tract infection, site not specified: Secondary | ICD-10-CM | POA: Diagnosis not present

## 2021-04-24 DIAGNOSIS — E440 Moderate protein-calorie malnutrition: Secondary | ICD-10-CM | POA: Diagnosis not present

## 2021-04-24 DIAGNOSIS — R339 Retention of urine, unspecified: Secondary | ICD-10-CM | POA: Diagnosis not present

## 2021-04-24 DIAGNOSIS — N390 Urinary tract infection, site not specified: Secondary | ICD-10-CM | POA: Diagnosis not present

## 2021-04-24 DIAGNOSIS — Z7901 Long term (current) use of anticoagulants: Secondary | ICD-10-CM | POA: Diagnosis not present

## 2021-04-24 DIAGNOSIS — G894 Chronic pain syndrome: Secondary | ICD-10-CM | POA: Diagnosis not present

## 2021-04-24 DIAGNOSIS — I480 Paroxysmal atrial fibrillation: Secondary | ICD-10-CM | POA: Diagnosis not present

## 2021-04-24 DIAGNOSIS — R531 Weakness: Secondary | ICD-10-CM | POA: Diagnosis not present

## 2021-04-27 DIAGNOSIS — G894 Chronic pain syndrome: Secondary | ICD-10-CM | POA: Diagnosis not present

## 2021-04-27 DIAGNOSIS — N390 Urinary tract infection, site not specified: Secondary | ICD-10-CM | POA: Diagnosis not present

## 2021-04-27 DIAGNOSIS — I480 Paroxysmal atrial fibrillation: Secondary | ICD-10-CM | POA: Diagnosis not present

## 2021-04-27 DIAGNOSIS — Z7901 Long term (current) use of anticoagulants: Secondary | ICD-10-CM | POA: Diagnosis not present

## 2021-04-27 DIAGNOSIS — R339 Retention of urine, unspecified: Secondary | ICD-10-CM | POA: Diagnosis not present

## 2021-04-27 DIAGNOSIS — R531 Weakness: Secondary | ICD-10-CM | POA: Diagnosis not present

## 2021-05-04 DIAGNOSIS — E46 Unspecified protein-calorie malnutrition: Secondary | ICD-10-CM | POA: Diagnosis not present

## 2021-05-04 DIAGNOSIS — G8929 Other chronic pain: Secondary | ICD-10-CM | POA: Diagnosis not present

## 2021-05-04 DIAGNOSIS — R531 Weakness: Secondary | ICD-10-CM | POA: Diagnosis not present

## 2021-05-04 DIAGNOSIS — F0390 Unspecified dementia without behavioral disturbance: Secondary | ICD-10-CM | POA: Diagnosis not present

## 2021-05-04 DIAGNOSIS — I480 Paroxysmal atrial fibrillation: Secondary | ICD-10-CM | POA: Diagnosis not present

## 2021-05-04 DIAGNOSIS — R339 Retention of urine, unspecified: Secondary | ICD-10-CM | POA: Diagnosis not present

## 2021-05-10 DIAGNOSIS — N390 Urinary tract infection, site not specified: Secondary | ICD-10-CM | POA: Diagnosis not present

## 2021-05-10 DIAGNOSIS — R531 Weakness: Secondary | ICD-10-CM | POA: Diagnosis not present

## 2021-05-10 DIAGNOSIS — L89620 Pressure ulcer of left heel, unstageable: Secondary | ICD-10-CM | POA: Diagnosis not present

## 2021-05-10 DIAGNOSIS — R339 Retention of urine, unspecified: Secondary | ICD-10-CM | POA: Diagnosis not present

## 2021-05-10 DIAGNOSIS — C61 Malignant neoplasm of prostate: Secondary | ICD-10-CM | POA: Diagnosis not present

## 2021-05-10 DIAGNOSIS — L89150 Pressure ulcer of sacral region, unstageable: Secondary | ICD-10-CM | POA: Diagnosis not present

## 2021-05-10 DIAGNOSIS — I480 Paroxysmal atrial fibrillation: Secondary | ICD-10-CM | POA: Diagnosis not present

## 2021-05-10 DIAGNOSIS — Z7901 Long term (current) use of anticoagulants: Secondary | ICD-10-CM | POA: Diagnosis not present

## 2021-05-11 DIAGNOSIS — Z7901 Long term (current) use of anticoagulants: Secondary | ICD-10-CM | POA: Diagnosis not present

## 2021-05-11 DIAGNOSIS — C61 Malignant neoplasm of prostate: Secondary | ICD-10-CM | POA: Diagnosis not present

## 2021-05-11 DIAGNOSIS — R339 Retention of urine, unspecified: Secondary | ICD-10-CM | POA: Diagnosis not present

## 2021-05-11 DIAGNOSIS — D649 Anemia, unspecified: Secondary | ICD-10-CM | POA: Diagnosis not present

## 2021-05-11 DIAGNOSIS — D72829 Elevated white blood cell count, unspecified: Secondary | ICD-10-CM | POA: Diagnosis not present

## 2021-05-11 DIAGNOSIS — I480 Paroxysmal atrial fibrillation: Secondary | ICD-10-CM | POA: Diagnosis not present

## 2021-05-11 DIAGNOSIS — G894 Chronic pain syndrome: Secondary | ICD-10-CM | POA: Diagnosis not present

## 2021-05-12 DIAGNOSIS — I10 Essential (primary) hypertension: Secondary | ICD-10-CM | POA: Diagnosis not present

## 2021-05-16 DIAGNOSIS — I480 Paroxysmal atrial fibrillation: Secondary | ICD-10-CM | POA: Diagnosis not present

## 2021-05-16 DIAGNOSIS — R339 Retention of urine, unspecified: Secondary | ICD-10-CM | POA: Diagnosis not present

## 2021-05-16 DIAGNOSIS — R531 Weakness: Secondary | ICD-10-CM | POA: Diagnosis not present

## 2021-05-16 DIAGNOSIS — E440 Moderate protein-calorie malnutrition: Secondary | ICD-10-CM | POA: Diagnosis not present

## 2021-05-16 DIAGNOSIS — Z7901 Long term (current) use of anticoagulants: Secondary | ICD-10-CM | POA: Diagnosis not present

## 2021-05-17 DIAGNOSIS — E0789 Other specified disorders of thyroid: Secondary | ICD-10-CM | POA: Diagnosis not present

## 2021-05-17 DIAGNOSIS — L89143 Pressure ulcer of left lower back, stage 3: Secondary | ICD-10-CM | POA: Diagnosis not present

## 2021-05-17 DIAGNOSIS — L89624 Pressure ulcer of left heel, stage 4: Secondary | ICD-10-CM | POA: Diagnosis not present

## 2021-05-17 DIAGNOSIS — L89150 Pressure ulcer of sacral region, unstageable: Secondary | ICD-10-CM | POA: Diagnosis not present

## 2021-05-17 DIAGNOSIS — N390 Urinary tract infection, site not specified: Secondary | ICD-10-CM | POA: Diagnosis not present

## 2021-05-17 DIAGNOSIS — D649 Anemia, unspecified: Secondary | ICD-10-CM | POA: Diagnosis not present

## 2021-05-21 DIAGNOSIS — R531 Weakness: Secondary | ICD-10-CM | POA: Diagnosis not present

## 2021-05-21 DIAGNOSIS — N390 Urinary tract infection, site not specified: Secondary | ICD-10-CM | POA: Diagnosis not present

## 2021-05-21 DIAGNOSIS — R319 Hematuria, unspecified: Secondary | ICD-10-CM | POA: Diagnosis not present

## 2021-05-21 DIAGNOSIS — I480 Paroxysmal atrial fibrillation: Secondary | ICD-10-CM | POA: Diagnosis not present

## 2021-05-21 DIAGNOSIS — E0590 Thyrotoxicosis, unspecified without thyrotoxic crisis or storm: Secondary | ICD-10-CM | POA: Diagnosis not present

## 2021-05-21 DIAGNOSIS — R339 Retention of urine, unspecified: Secondary | ICD-10-CM | POA: Diagnosis not present

## 2021-05-21 DIAGNOSIS — G894 Chronic pain syndrome: Secondary | ICD-10-CM | POA: Diagnosis not present

## 2021-05-21 DIAGNOSIS — Z7901 Long term (current) use of anticoagulants: Secondary | ICD-10-CM | POA: Diagnosis not present

## 2021-05-24 DIAGNOSIS — L89143 Pressure ulcer of left lower back, stage 3: Secondary | ICD-10-CM | POA: Diagnosis not present

## 2021-05-24 DIAGNOSIS — L89153 Pressure ulcer of sacral region, stage 3: Secondary | ICD-10-CM | POA: Diagnosis not present

## 2021-05-24 DIAGNOSIS — L89624 Pressure ulcer of left heel, stage 4: Secondary | ICD-10-CM | POA: Diagnosis not present

## 2021-05-26 DIAGNOSIS — I10 Essential (primary) hypertension: Secondary | ICD-10-CM | POA: Diagnosis not present

## 2021-05-27 DIAGNOSIS — I480 Paroxysmal atrial fibrillation: Secondary | ICD-10-CM | POA: Diagnosis not present

## 2021-05-27 DIAGNOSIS — G894 Chronic pain syndrome: Secondary | ICD-10-CM | POA: Diagnosis not present

## 2021-05-27 DIAGNOSIS — R319 Hematuria, unspecified: Secondary | ICD-10-CM | POA: Diagnosis not present

## 2021-05-27 DIAGNOSIS — D649 Anemia, unspecified: Secondary | ICD-10-CM | POA: Diagnosis not present

## 2021-05-27 DIAGNOSIS — R339 Retention of urine, unspecified: Secondary | ICD-10-CM | POA: Diagnosis not present

## 2021-05-27 DIAGNOSIS — Z7901 Long term (current) use of anticoagulants: Secondary | ICD-10-CM | POA: Diagnosis not present

## 2021-05-28 DIAGNOSIS — G894 Chronic pain syndrome: Secondary | ICD-10-CM | POA: Diagnosis not present

## 2021-05-28 DIAGNOSIS — R319 Hematuria, unspecified: Secondary | ICD-10-CM | POA: Diagnosis not present

## 2021-05-28 DIAGNOSIS — R339 Retention of urine, unspecified: Secondary | ICD-10-CM | POA: Diagnosis not present

## 2021-05-28 DIAGNOSIS — I480 Paroxysmal atrial fibrillation: Secondary | ICD-10-CM | POA: Diagnosis not present

## 2021-05-28 DIAGNOSIS — L8990 Pressure ulcer of unspecified site, unspecified stage: Secondary | ICD-10-CM | POA: Diagnosis not present

## 2021-05-28 DIAGNOSIS — I10 Essential (primary) hypertension: Secondary | ICD-10-CM | POA: Diagnosis not present

## 2021-05-28 DIAGNOSIS — T460X1A Poisoning by cardiac-stimulant glycosides and drugs of similar action, accidental (unintentional), initial encounter: Secondary | ICD-10-CM | POA: Diagnosis not present

## 2021-05-28 DIAGNOSIS — E440 Moderate protein-calorie malnutrition: Secondary | ICD-10-CM | POA: Diagnosis not present

## 2021-05-31 DIAGNOSIS — L22 Diaper dermatitis: Secondary | ICD-10-CM | POA: Diagnosis not present

## 2021-05-31 DIAGNOSIS — L89624 Pressure ulcer of left heel, stage 4: Secondary | ICD-10-CM | POA: Diagnosis not present

## 2021-05-31 DIAGNOSIS — L89153 Pressure ulcer of sacral region, stage 3: Secondary | ICD-10-CM | POA: Diagnosis not present

## 2021-05-31 DIAGNOSIS — I10 Essential (primary) hypertension: Secondary | ICD-10-CM | POA: Diagnosis not present

## 2021-06-01 DIAGNOSIS — N390 Urinary tract infection, site not specified: Secondary | ICD-10-CM | POA: Diagnosis not present

## 2021-06-01 DIAGNOSIS — Z7901 Long term (current) use of anticoagulants: Secondary | ICD-10-CM | POA: Diagnosis not present

## 2021-06-01 DIAGNOSIS — E43 Unspecified severe protein-calorie malnutrition: Secondary | ICD-10-CM | POA: Diagnosis not present

## 2021-06-01 DIAGNOSIS — R319 Hematuria, unspecified: Secondary | ICD-10-CM | POA: Diagnosis not present

## 2021-06-01 DIAGNOSIS — I480 Paroxysmal atrial fibrillation: Secondary | ICD-10-CM | POA: Diagnosis not present

## 2021-06-01 DIAGNOSIS — L8990 Pressure ulcer of unspecified site, unspecified stage: Secondary | ICD-10-CM | POA: Diagnosis not present

## 2021-06-01 DIAGNOSIS — G894 Chronic pain syndrome: Secondary | ICD-10-CM | POA: Diagnosis not present

## 2021-06-01 DIAGNOSIS — D649 Anemia, unspecified: Secondary | ICD-10-CM | POA: Diagnosis not present

## 2021-06-01 DIAGNOSIS — F0390 Unspecified dementia without behavioral disturbance: Secondary | ICD-10-CM | POA: Diagnosis not present

## 2021-06-01 DIAGNOSIS — R339 Retention of urine, unspecified: Secondary | ICD-10-CM | POA: Diagnosis not present

## 2021-06-07 DIAGNOSIS — L8990 Pressure ulcer of unspecified site, unspecified stage: Secondary | ICD-10-CM | POA: Diagnosis not present

## 2021-06-07 DIAGNOSIS — R339 Retention of urine, unspecified: Secondary | ICD-10-CM | POA: Diagnosis not present

## 2021-06-07 DIAGNOSIS — G894 Chronic pain syndrome: Secondary | ICD-10-CM | POA: Diagnosis not present

## 2021-06-07 DIAGNOSIS — I480 Paroxysmal atrial fibrillation: Secondary | ICD-10-CM | POA: Diagnosis not present

## 2021-06-07 DIAGNOSIS — L89624 Pressure ulcer of left heel, stage 4: Secondary | ICD-10-CM | POA: Diagnosis not present

## 2021-06-07 DIAGNOSIS — R531 Weakness: Secondary | ICD-10-CM | POA: Diagnosis not present

## 2021-06-07 DIAGNOSIS — D649 Anemia, unspecified: Secondary | ICD-10-CM | POA: Diagnosis not present

## 2021-06-07 DIAGNOSIS — L89153 Pressure ulcer of sacral region, stage 3: Secondary | ICD-10-CM | POA: Diagnosis not present

## 2021-06-07 DIAGNOSIS — L89143 Pressure ulcer of left lower back, stage 3: Secondary | ICD-10-CM | POA: Diagnosis not present

## 2021-06-07 DIAGNOSIS — E440 Moderate protein-calorie malnutrition: Secondary | ICD-10-CM | POA: Diagnosis not present

## 2021-06-13 DIAGNOSIS — D649 Anemia, unspecified: Secondary | ICD-10-CM | POA: Diagnosis not present

## 2021-06-14 DIAGNOSIS — R339 Retention of urine, unspecified: Secondary | ICD-10-CM | POA: Diagnosis not present

## 2021-06-14 DIAGNOSIS — R319 Hematuria, unspecified: Secondary | ICD-10-CM | POA: Diagnosis not present

## 2021-06-14 DIAGNOSIS — Z7901 Long term (current) use of anticoagulants: Secondary | ICD-10-CM | POA: Diagnosis not present

## 2021-06-14 DIAGNOSIS — D649 Anemia, unspecified: Secondary | ICD-10-CM | POA: Diagnosis not present

## 2021-06-14 DIAGNOSIS — I480 Paroxysmal atrial fibrillation: Secondary | ICD-10-CM | POA: Diagnosis not present

## 2021-06-14 DIAGNOSIS — L97429 Non-pressure chronic ulcer of left heel and midfoot with unspecified severity: Secondary | ICD-10-CM | POA: Diagnosis not present

## 2021-06-14 DIAGNOSIS — L89153 Pressure ulcer of sacral region, stage 3: Secondary | ICD-10-CM | POA: Diagnosis not present

## 2021-06-14 DIAGNOSIS — L89624 Pressure ulcer of left heel, stage 4: Secondary | ICD-10-CM | POA: Diagnosis not present

## 2021-06-14 DIAGNOSIS — G894 Chronic pain syndrome: Secondary | ICD-10-CM | POA: Diagnosis not present

## 2021-06-14 DIAGNOSIS — L8990 Pressure ulcer of unspecified site, unspecified stage: Secondary | ICD-10-CM | POA: Diagnosis not present

## 2021-06-15 DIAGNOSIS — R319 Hematuria, unspecified: Secondary | ICD-10-CM | POA: Diagnosis not present

## 2021-06-15 DIAGNOSIS — F0390 Unspecified dementia without behavioral disturbance: Secondary | ICD-10-CM | POA: Diagnosis not present

## 2021-06-15 DIAGNOSIS — R339 Retention of urine, unspecified: Secondary | ICD-10-CM | POA: Diagnosis not present

## 2021-06-15 DIAGNOSIS — E43 Unspecified severe protein-calorie malnutrition: Secondary | ICD-10-CM | POA: Diagnosis not present

## 2021-06-16 DIAGNOSIS — I482 Chronic atrial fibrillation, unspecified: Secondary | ICD-10-CM | POA: Diagnosis not present

## 2021-06-16 DIAGNOSIS — Z7901 Long term (current) use of anticoagulants: Secondary | ICD-10-CM | POA: Diagnosis not present

## 2021-06-16 DIAGNOSIS — R531 Weakness: Secondary | ICD-10-CM | POA: Diagnosis not present

## 2021-06-16 DIAGNOSIS — E440 Moderate protein-calorie malnutrition: Secondary | ICD-10-CM | POA: Diagnosis not present

## 2021-06-16 DIAGNOSIS — G894 Chronic pain syndrome: Secondary | ICD-10-CM | POA: Diagnosis not present

## 2021-06-16 DIAGNOSIS — R339 Retention of urine, unspecified: Secondary | ICD-10-CM | POA: Diagnosis not present

## 2021-06-17 DIAGNOSIS — I480 Paroxysmal atrial fibrillation: Secondary | ICD-10-CM | POA: Diagnosis not present

## 2021-06-17 DIAGNOSIS — L8990 Pressure ulcer of unspecified site, unspecified stage: Secondary | ICD-10-CM | POA: Diagnosis not present

## 2021-06-17 DIAGNOSIS — K5900 Constipation, unspecified: Secondary | ICD-10-CM | POA: Diagnosis not present

## 2021-06-17 DIAGNOSIS — R339 Retention of urine, unspecified: Secondary | ICD-10-CM | POA: Diagnosis not present

## 2021-06-17 DIAGNOSIS — E440 Moderate protein-calorie malnutrition: Secondary | ICD-10-CM | POA: Diagnosis not present

## 2021-06-17 DIAGNOSIS — G894 Chronic pain syndrome: Secondary | ICD-10-CM | POA: Diagnosis not present

## 2021-06-17 DIAGNOSIS — R531 Weakness: Secondary | ICD-10-CM | POA: Diagnosis not present

## 2021-06-19 DIAGNOSIS — I482 Chronic atrial fibrillation, unspecified: Secondary | ICD-10-CM | POA: Diagnosis not present

## 2021-06-19 DIAGNOSIS — Z7901 Long term (current) use of anticoagulants: Secondary | ICD-10-CM | POA: Diagnosis not present

## 2021-06-19 DIAGNOSIS — G894 Chronic pain syndrome: Secondary | ICD-10-CM | POA: Diagnosis not present

## 2021-06-19 DIAGNOSIS — R531 Weakness: Secondary | ICD-10-CM | POA: Diagnosis not present

## 2021-06-19 DIAGNOSIS — R339 Retention of urine, unspecified: Secondary | ICD-10-CM | POA: Diagnosis not present

## 2021-06-19 DIAGNOSIS — E440 Moderate protein-calorie malnutrition: Secondary | ICD-10-CM | POA: Diagnosis not present

## 2021-06-20 DIAGNOSIS — Z743 Need for continuous supervision: Secondary | ICD-10-CM | POA: Diagnosis not present

## 2021-06-20 DIAGNOSIS — R531 Weakness: Secondary | ICD-10-CM | POA: Diagnosis not present

## 2021-06-20 DIAGNOSIS — R310 Gross hematuria: Secondary | ICD-10-CM | POA: Diagnosis not present

## 2021-06-20 DIAGNOSIS — Z7401 Bed confinement status: Secondary | ICD-10-CM | POA: Diagnosis not present

## 2021-06-20 DIAGNOSIS — R338 Other retention of urine: Secondary | ICD-10-CM | POA: Diagnosis not present

## 2021-06-20 DIAGNOSIS — R58 Hemorrhage, not elsewhere classified: Secondary | ICD-10-CM | POA: Diagnosis not present

## 2021-06-21 DIAGNOSIS — L89143 Pressure ulcer of left lower back, stage 3: Secondary | ICD-10-CM | POA: Diagnosis not present

## 2021-06-21 DIAGNOSIS — L89153 Pressure ulcer of sacral region, stage 3: Secondary | ICD-10-CM | POA: Diagnosis not present

## 2021-06-27 DIAGNOSIS — R319 Hematuria, unspecified: Secondary | ICD-10-CM | POA: Diagnosis not present

## 2021-06-27 DIAGNOSIS — I482 Chronic atrial fibrillation, unspecified: Secondary | ICD-10-CM | POA: Diagnosis not present

## 2021-06-27 DIAGNOSIS — L8990 Pressure ulcer of unspecified site, unspecified stage: Secondary | ICD-10-CM | POA: Diagnosis not present

## 2021-06-27 DIAGNOSIS — R339 Retention of urine, unspecified: Secondary | ICD-10-CM | POA: Diagnosis not present

## 2021-06-27 DIAGNOSIS — R531 Weakness: Secondary | ICD-10-CM | POA: Diagnosis not present

## 2021-06-27 DIAGNOSIS — G894 Chronic pain syndrome: Secondary | ICD-10-CM | POA: Diagnosis not present

## 2021-06-27 DIAGNOSIS — E440 Moderate protein-calorie malnutrition: Secondary | ICD-10-CM | POA: Diagnosis not present

## 2021-06-27 DIAGNOSIS — Z7901 Long term (current) use of anticoagulants: Secondary | ICD-10-CM | POA: Diagnosis not present

## 2021-06-28 DIAGNOSIS — L89154 Pressure ulcer of sacral region, stage 4: Secondary | ICD-10-CM | POA: Diagnosis not present

## 2021-06-28 DIAGNOSIS — D649 Anemia, unspecified: Secondary | ICD-10-CM | POA: Diagnosis not present

## 2021-06-28 DIAGNOSIS — L89624 Pressure ulcer of left heel, stage 4: Secondary | ICD-10-CM | POA: Diagnosis not present

## 2021-06-29 DIAGNOSIS — R339 Retention of urine, unspecified: Secondary | ICD-10-CM | POA: Diagnosis not present

## 2021-06-29 DIAGNOSIS — I482 Chronic atrial fibrillation, unspecified: Secondary | ICD-10-CM | POA: Diagnosis not present

## 2021-06-29 DIAGNOSIS — E440 Moderate protein-calorie malnutrition: Secondary | ICD-10-CM | POA: Diagnosis not present

## 2021-06-29 DIAGNOSIS — G894 Chronic pain syndrome: Secondary | ICD-10-CM | POA: Diagnosis not present

## 2021-06-29 DIAGNOSIS — D72829 Elevated white blood cell count, unspecified: Secondary | ICD-10-CM | POA: Diagnosis not present

## 2021-06-29 DIAGNOSIS — L8990 Pressure ulcer of unspecified site, unspecified stage: Secondary | ICD-10-CM | POA: Diagnosis not present

## 2021-06-29 DIAGNOSIS — D649 Anemia, unspecified: Secondary | ICD-10-CM | POA: Diagnosis not present

## 2021-07-01 DIAGNOSIS — R531 Weakness: Secondary | ICD-10-CM | POA: Diagnosis not present

## 2021-07-01 DIAGNOSIS — I482 Chronic atrial fibrillation, unspecified: Secondary | ICD-10-CM | POA: Diagnosis not present

## 2021-07-01 DIAGNOSIS — G894 Chronic pain syndrome: Secondary | ICD-10-CM | POA: Diagnosis not present

## 2021-07-01 DIAGNOSIS — E440 Moderate protein-calorie malnutrition: Secondary | ICD-10-CM | POA: Diagnosis not present

## 2021-07-01 DIAGNOSIS — C61 Malignant neoplasm of prostate: Secondary | ICD-10-CM | POA: Diagnosis not present

## 2021-07-01 DIAGNOSIS — R339 Retention of urine, unspecified: Secondary | ICD-10-CM | POA: Diagnosis not present

## 2021-07-02 ENCOUNTER — Non-Acute Institutional Stay: Payer: Self-pay

## 2021-07-02 ENCOUNTER — Non-Acute Institutional Stay: Payer: Self-pay | Admitting: *Deleted

## 2021-07-02 ENCOUNTER — Other Ambulatory Visit: Payer: Self-pay

## 2021-07-02 DIAGNOSIS — Z515 Encounter for palliative care: Secondary | ICD-10-CM

## 2021-07-02 NOTE — Progress Notes (Signed)
COMMUNITY PALLIATIVE CARE SW NOTE  PATIENT NAME: Casey Gates. DOB: 23-Mar-1926 MRN: [id number]  PRIMARY CARE PROVIDER: Wenda Low, MD  RESPONSIBLE PARTY:  Acct ID - Guarantor Home Phone Work Phone Relationship Acct Type  [id number] [name], [name] [phone]  Self P/F     [address], [name], [place] [address]     PLAN OF CARE and INTERVENTIONS:             GOALS OF CARE/ ADVANCE CARE PLANNING:  Goal is for patient's comfort to be maintained. SOCIAL/EMOTIONAL/SPIRITUAL ASSESSMENT/ INTERVENTIONS:  SW and RN-M. Nadara Mustard (virtually) visited with patient at [facility]. Patient was in bed. He aroused momentarily and mumbled some nonsensical verbalizations before going back to sleep. Patient is pale and thin (boney)in appearance. The team consulted with the facility nurse-Dawn,LPN who updated the team on declined noted in patient over the past three weeks. The LPN advised that patient has multiple wounds that is difficult to treat/dress due to the pain. Patient yells out with any ADL or wound care. He has PRN morphine for pain. He has a sacral wound that has doubled in size, a Gates back wound and a right heel wound. He has foley catheter. He is on an air mattress. His right leg is contracted and he often reports pain. Patient is only taking sips and bites. Dawn expressed that she believed patient was approaching end-of-life and desired a hospice consult. The palliative care team agreed and the facility staff will seek an order from patient's PCP for a palliative consult. Patient's son is out of town, but will be contacted by the facility SW-Casey Gates. PATIENT/CAREGIVER EDUCATION/ COPING:  No coping issues.  PERSONAL EMERGENCY PLAN:  Per facility protocol.  COMMUNITY RESOURCES COORDINATION/ HEALTH CARE NAVIGATION:  Patient is currently a SNF patient at [facility]. Hospice consult will be requested. FINANCIAL/LEGAL CONCERNS/INTERVENTIONS:  None.     SOCIAL HX:  Social History   Tobacco  Use   Smoking status: Never   Smokeless tobacco: Never  Substance Use Topics   Alcohol use: Not Currently    Comment: quit 1985, 2 fifths/ week,S/P rehab    CODE STATUS: DNR ADVANCED DIRECTIVES: Yes MOST FORM COMPLETE:  No HOSPICE EDUCATION PROVIDED: Yes, hospice consult to be requested  PPS: Patient is dependent for all ADL's. He has several wounds and is constant pain with any touch, ADL, or wound care.  Duration of visit and documentation: 60 minutes  Katheren Puller, LCSW

## 2021-07-04 NOTE — Progress Notes (Signed)
[facility]ITY PALLIATIVE CARE RN NOTE  PATIENT NAME: Casey Gates. DOB: Apr 01, 1926 MRN: [id number]  PRIMARY CARE PROVIDER: Wenda Low, MD  RESPONSIBLE PARTY:  Acct ID - Guarantor Home Phone Work Phone Relationship Acct Type  [id number] [name], [name] [phone]  Self P/F     [address], [name], [name] [address]   Due to the COVID-19 crisis, this virtual check-in visit was done via telephone from my office and it was initiated and consent by this patient and or family.  PLAN OF CARE and INTERVENTION:  ADVANCE CARE PLANNING/GOALS OF CARE: Goal is for comfort measures for patient and more pain relief. PATIENT/CAREGIVER EDUCATION: Explained palliative care services vs hospice services DISEASE STATUS: Joint initial palliative care visit completed with M.Lonon, LCSW. I was conferenced in via telehealth. Patient currently resides at [facility] facility. He is lying in bed awake. He was very minimally engaging due to lethargy and fatigue. He reports feeling tired but most of his speech was unintelligible. Spoke with facility LPN-Dawn who reports that patient has 3 unstageable wounds;sacrum (which has doubled in size) lower back and right heel. They have difficulty turning/repositioning patient, providing personal care or wound care due to pain from these wounds. He has PRN Morphine to help. He is on an air mattress. He also has a right leg contracture that causes pain. He is total care with all ADLs. His appetite continues to deteriorate. He is now on a Pureed diet and staff is having to assist him with feeding. He is thin/frail appearing. He has a foley catheter. He is incontinent of bowel. Staff feels that patient is hospice appropriate at this point and RN/SW team agrees given his current decline and overall condition. Facility SW to obtain order for a hospice consult from his PCP after contacting his son, who is currently out of town.   HISTORY OF PRESENT ILLNESS:  This  is a 87 yo male with a history CHF, Afib w/RVR, sleep apnea, malignant GI stromal tumor of stomach, prostate cancer and urinary retention. Palliative care team was asked to follow patient, however upon assessment feels patient was more appropriate for hospice. Facility SW to obtain hospice order from PCP.  CODE STATUS: DNR ADVANCED DIRECTIVES: Y MOST FORM: no PPS: 30%   (Duration of visit and documentation 30 minutes)    Daryl Eastern, RN BSN

## 2021-07-05 DIAGNOSIS — R627 Adult failure to thrive: Secondary | ICD-10-CM | POA: Diagnosis not present

## 2021-07-05 DIAGNOSIS — G894 Chronic pain syndrome: Secondary | ICD-10-CM | POA: Diagnosis not present

## 2021-07-05 DIAGNOSIS — L8990 Pressure ulcer of unspecified site, unspecified stage: Secondary | ICD-10-CM | POA: Diagnosis not present

## 2021-07-05 DIAGNOSIS — R339 Retention of urine, unspecified: Secondary | ICD-10-CM | POA: Diagnosis not present

## 2021-07-05 DIAGNOSIS — I482 Chronic atrial fibrillation, unspecified: Secondary | ICD-10-CM | POA: Diagnosis not present

## 2021-07-05 DIAGNOSIS — L89143 Pressure ulcer of left lower back, stage 3: Secondary | ICD-10-CM | POA: Diagnosis not present

## 2021-07-05 DIAGNOSIS — L89154 Pressure ulcer of sacral region, stage 4: Secondary | ICD-10-CM | POA: Diagnosis not present

## 2021-08-02 DEATH — deceased

## 2021-08-27 ENCOUNTER — Ambulatory Visit: Payer: Medicare PPO | Admitting: Interventional Cardiology

## 2024-03-11 NOTE — Progress Notes (Signed)
 This encounter was created in error - please disregard.
